# Patient Record
Sex: Female | Born: 1935
Health system: Southern US, Community
[De-identification: ages and names within clinical notes are randomized; demographics above are authoritative.]

## PROBLEM LIST (undated history)

## (undated) DIAGNOSIS — F329 Major depressive disorder, single episode, unspecified: Secondary | ICD-10-CM

## (undated) DIAGNOSIS — M5416 Radiculopathy, lumbar region: Secondary | ICD-10-CM

## (undated) DIAGNOSIS — R32 Unspecified urinary incontinence: Secondary | ICD-10-CM

## (undated) DIAGNOSIS — M1990 Unspecified osteoarthritis, unspecified site: Secondary | ICD-10-CM

## (undated) DIAGNOSIS — E039 Hypothyroidism, unspecified: Secondary | ICD-10-CM

## (undated) DIAGNOSIS — S6291XA Unspecified fracture of right wrist and hand, initial encounter for closed fracture: Secondary | ICD-10-CM

## (undated) DIAGNOSIS — I82409 Acute embolism and thrombosis of unspecified deep veins of unspecified lower extremity: Secondary | ICD-10-CM

## (undated) DIAGNOSIS — R7303 Prediabetes: Secondary | ICD-10-CM

## (undated) DIAGNOSIS — D649 Anemia, unspecified: Secondary | ICD-10-CM

## (undated) DIAGNOSIS — G4730 Sleep apnea, unspecified: Secondary | ICD-10-CM

## (undated) DIAGNOSIS — Z9289 Personal history of other medical treatment: Secondary | ICD-10-CM

## (undated) DIAGNOSIS — F419 Anxiety disorder, unspecified: Secondary | ICD-10-CM

## (undated) DIAGNOSIS — R059 Cough, unspecified: Secondary | ICD-10-CM

## (undated) DIAGNOSIS — F418 Other specified anxiety disorders: Secondary | ICD-10-CM

## (undated) DIAGNOSIS — IMO0001 Reserved for inherently not codable concepts without codable children: Secondary | ICD-10-CM

## (undated) DIAGNOSIS — I499 Cardiac arrhythmia, unspecified: Secondary | ICD-10-CM

## (undated) DIAGNOSIS — R51 Headache: Secondary | ICD-10-CM

## (undated) DIAGNOSIS — I4891 Unspecified atrial fibrillation: Secondary | ICD-10-CM

## (undated) DIAGNOSIS — C801 Malignant (primary) neoplasm, unspecified: Secondary | ICD-10-CM

## (undated) DIAGNOSIS — J189 Pneumonia, unspecified organism: Secondary | ICD-10-CM

## (undated) DIAGNOSIS — R296 Repeated falls: Secondary | ICD-10-CM

## (undated) DIAGNOSIS — F32A Depression, unspecified: Secondary | ICD-10-CM

## (undated) DIAGNOSIS — Z5189 Encounter for other specified aftercare: Secondary | ICD-10-CM

## (undated) DIAGNOSIS — R05 Cough: Secondary | ICD-10-CM

## (undated) DIAGNOSIS — R0602 Shortness of breath: Secondary | ICD-10-CM

## (undated) DIAGNOSIS — E119 Type 2 diabetes mellitus without complications: Secondary | ICD-10-CM

## (undated) DIAGNOSIS — F0390 Unspecified dementia without behavioral disturbance: Secondary | ICD-10-CM

## (undated) DIAGNOSIS — M542 Cervicalgia: Secondary | ICD-10-CM

## (undated) HISTORY — DX: Cough, unspecified: R05.9

## (undated) HISTORY — PX: REPLACEMENT TOTAL KNEE BILATERAL: SUR1225

## (undated) HISTORY — PX: HERNIA REPAIR: SHX51

## (undated) HISTORY — DX: Type 2 diabetes mellitus without complications: E11.9

## (undated) HISTORY — DX: Cough: R05

## (undated) HISTORY — PX: LAPAROSCOPIC OVARIAN CYSTECTOMY: SUR786

## (undated) HISTORY — PX: JOINT REPLACEMENT: SHX530

## (undated) HISTORY — PX: TONSILLECTOMY: SUR1361

## (undated) HISTORY — PX: EYE SURGERY: SHX253

## (undated) HISTORY — PX: LEG SURGERY: SHX1003

## (undated) HISTORY — PX: APPENDECTOMY: SHX54

## (undated) HISTORY — PX: BACK SURGERY: SHX140

## (undated) HISTORY — DX: Unspecified dementia, unspecified severity, without behavioral disturbance, psychotic disturbance, mood disturbance, and anxiety: F03.90

## (undated) HISTORY — DX: Other specified anxiety disorders: F41.8

## (undated) HISTORY — DX: Prediabetes: R73.03

## (undated) HISTORY — PX: BREAST EXCISIONAL BIOPSY: SUR124

---

## 1995-11-21 LAB — HM PAP SMEAR

## 1995-11-21 LAB — HM MAMMOGRAPHY

## 2007-11-21 LAB — HM COLONOSCOPY

## 2009-08-06 DIAGNOSIS — I10 Essential (primary) hypertension: Secondary | ICD-10-CM | POA: Insufficient documentation

## 2009-08-06 DIAGNOSIS — E039 Hypothyroidism, unspecified: Secondary | ICD-10-CM | POA: Insufficient documentation

## 2009-08-06 DIAGNOSIS — F309 Manic episode, unspecified: Secondary | ICD-10-CM | POA: Insufficient documentation

## 2009-08-06 DIAGNOSIS — M129 Arthropathy, unspecified: Secondary | ICD-10-CM | POA: Insufficient documentation

## 2009-08-24 ENCOUNTER — Encounter: Admission: RE | Admit: 2009-08-24 | Discharge: 2009-08-24 | Payer: Self-pay | Admitting: Family Medicine

## 2009-08-24 ENCOUNTER — Encounter: Payer: Self-pay | Admitting: Internal Medicine

## 2010-03-02 ENCOUNTER — Ambulatory Visit: Payer: Self-pay | Admitting: Internal Medicine

## 2010-03-02 DIAGNOSIS — F341 Dysthymic disorder: Secondary | ICD-10-CM

## 2010-03-02 DIAGNOSIS — I959 Hypotension, unspecified: Secondary | ICD-10-CM

## 2010-03-02 DIAGNOSIS — R059 Cough, unspecified: Secondary | ICD-10-CM | POA: Insufficient documentation

## 2010-03-02 DIAGNOSIS — R079 Chest pain, unspecified: Secondary | ICD-10-CM

## 2010-03-02 DIAGNOSIS — R0609 Other forms of dyspnea: Secondary | ICD-10-CM

## 2010-03-02 DIAGNOSIS — R0989 Other specified symptoms and signs involving the circulatory and respiratory systems: Secondary | ICD-10-CM

## 2010-03-02 DIAGNOSIS — R05 Cough: Secondary | ICD-10-CM

## 2010-03-03 ENCOUNTER — Telehealth: Payer: Self-pay | Admitting: Internal Medicine

## 2010-03-03 LAB — CONVERTED CEMR LAB
ALT: 15 units/L (ref 0–35)
AST: 22 units/L (ref 0–37)
Albumin: 3.9 g/dL (ref 3.5–5.2)
Alkaline Phosphatase: 63 units/L (ref 39–117)
BUN: 25 mg/dL — ABNORMAL HIGH (ref 6–23)
Basophils Absolute: 0 10*3/uL (ref 0.0–0.1)
Basophils Relative: 0.1 % (ref 0.0–3.0)
Bilirubin, Direct: 0.1 mg/dL (ref 0.0–0.3)
CK-MB: 1.2 ng/mL (ref 0.3–4.0)
CO2: 31 meq/L (ref 19–32)
Calcium: 9.1 mg/dL (ref 8.4–10.5)
Chloride: 100 meq/L (ref 96–112)
Creatinine, Ser: 0.8 mg/dL (ref 0.4–1.2)
Eosinophils Absolute: 0 10*3/uL (ref 0.0–0.7)
Eosinophils Relative: 0.1 % (ref 0.0–5.0)
GFR calc non Af Amer: 74.79 mL/min (ref >60)
Glucose, Bld: 138 mg/dL — ABNORMAL HIGH (ref 70–99)
HCT: 37.9 % (ref 36.0–46.0)
Hemoglobin: 13.1 g/dL (ref 12.0–15.0)
Lymphocytes Relative: 11.1 % — ABNORMAL LOW (ref 12.0–46.0)
Lymphs Abs: 0.8 10*3/uL (ref 0.7–4.0)
MCHC: 34.4 g/dL (ref 30.0–36.0)
MCV: 89.8 fL (ref 78.0–100.0)
Monocytes Absolute: 0.1 10*3/uL (ref 0.1–1.0)
Monocytes Relative: 1.4 % — ABNORMAL LOW (ref 3.0–12.0)
Neutro Abs: 6.1 10*3/uL (ref 1.4–7.7)
Neutrophils Relative %: 87.3 % — ABNORMAL HIGH (ref 43.0–77.0)
Platelets: 230 10*3/uL (ref 150.0–400.0)
Potassium: 4.5 meq/L (ref 3.5–5.1)
Pro B Natriuretic peptide (BNP): 212 pg/mL — ABNORMAL HIGH (ref 0.0–100.0)
RBC: 4.23 M/uL (ref 3.87–5.11)
RDW: 13.2 % (ref 11.5–14.6)
Relative Index: 1.9 (ref 0.0–2.5)
Sed Rate: 10 mm/hr (ref 0–22)
Sodium: 137 meq/L (ref 135–145)
TSH: 0.47 microintl units/mL (ref 0.35–5.50)
Total Bilirubin: 0.4 mg/dL (ref 0.3–1.2)
Total CK: 62 units/L (ref 7–177)
Total Protein: 7 g/dL (ref 6.0–8.3)
WBC: 6.9 10*3/uL (ref 4.5–10.5)

## 2010-03-22 ENCOUNTER — Ambulatory Visit: Payer: Self-pay | Admitting: Internal Medicine

## 2010-03-24 ENCOUNTER — Ambulatory Visit (HOSPITAL_COMMUNITY)
Admission: RE | Admit: 2010-03-24 | Discharge: 2010-03-24 | Payer: Self-pay | Source: Home / Self Care | Admitting: Internal Medicine

## 2010-04-05 ENCOUNTER — Ambulatory Visit: Payer: Self-pay | Admitting: Internal Medicine

## 2010-04-06 ENCOUNTER — Ambulatory Visit: Payer: Self-pay | Admitting: Cardiology

## 2010-04-07 ENCOUNTER — Telehealth: Payer: Self-pay | Admitting: Adult Health

## 2010-04-21 ENCOUNTER — Ambulatory Visit: Payer: Self-pay | Admitting: Cardiovascular Disease

## 2010-04-21 DIAGNOSIS — R609 Edema, unspecified: Secondary | ICD-10-CM | POA: Insufficient documentation

## 2010-04-22 ENCOUNTER — Telehealth (INDEPENDENT_AMBULATORY_CARE_PROVIDER_SITE_OTHER): Payer: Self-pay | Admitting: *Deleted

## 2010-04-25 ENCOUNTER — Telehealth: Payer: Self-pay | Admitting: Cardiovascular Disease

## 2010-04-26 ENCOUNTER — Ambulatory Visit (HOSPITAL_COMMUNITY): Admission: RE | Admit: 2010-04-26 | Discharge: 2010-04-26 | Payer: Self-pay | Admitting: Cardiovascular Disease

## 2010-04-26 ENCOUNTER — Ambulatory Visit: Payer: Self-pay

## 2010-04-26 ENCOUNTER — Telehealth (INDEPENDENT_AMBULATORY_CARE_PROVIDER_SITE_OTHER): Payer: Self-pay

## 2010-04-26 ENCOUNTER — Ambulatory Visit: Payer: Self-pay | Admitting: Cardiovascular Disease

## 2010-04-26 ENCOUNTER — Encounter: Payer: Self-pay | Admitting: Cardiovascular Disease

## 2010-04-27 ENCOUNTER — Telehealth (INDEPENDENT_AMBULATORY_CARE_PROVIDER_SITE_OTHER): Payer: Self-pay | Admitting: *Deleted

## 2010-04-28 ENCOUNTER — Encounter: Payer: Self-pay | Admitting: Cardiovascular Disease

## 2010-04-28 ENCOUNTER — Encounter (HOSPITAL_COMMUNITY): Admission: RE | Admit: 2010-04-28 | Discharge: 2010-06-21 | Payer: Self-pay | Admitting: Cardiovascular Disease

## 2010-04-28 ENCOUNTER — Ambulatory Visit: Payer: Self-pay | Admitting: Cardiovascular Disease

## 2010-04-28 ENCOUNTER — Ambulatory Visit: Payer: Self-pay

## 2010-04-28 LAB — CONVERTED CEMR LAB
Pro B Natriuretic peptide (BNP): 53.1 pg/mL (ref 0.0–100.0)
Sed Rate: 9 mm/hr (ref 0–22)

## 2010-05-05 ENCOUNTER — Ambulatory Visit: Payer: Self-pay | Admitting: Internal Medicine

## 2010-05-05 DIAGNOSIS — R930 Abnormal findings on diagnostic imaging of skull and head, not elsewhere classified: Secondary | ICD-10-CM

## 2010-05-09 ENCOUNTER — Ambulatory Visit: Payer: Self-pay | Admitting: Cardiology

## 2010-05-09 LAB — CONVERTED CEMR LAB: POC INR: 1.6

## 2010-05-13 ENCOUNTER — Ambulatory Visit: Payer: Self-pay | Admitting: Cardiology

## 2010-05-13 LAB — CONVERTED CEMR LAB: POC INR: 2.8

## 2010-05-17 ENCOUNTER — Ambulatory Visit: Payer: Self-pay | Admitting: Internal Medicine

## 2010-05-17 LAB — CONVERTED CEMR LAB: POC INR: 2.4

## 2010-05-30 ENCOUNTER — Ambulatory Visit: Payer: Self-pay | Admitting: Cardiology

## 2010-05-30 ENCOUNTER — Ambulatory Visit: Payer: Self-pay | Admitting: Internal Medicine

## 2010-05-30 LAB — CONVERTED CEMR LAB: POC INR: 2.7

## 2010-06-27 ENCOUNTER — Ambulatory Visit: Payer: Self-pay | Admitting: Internal Medicine

## 2010-06-27 LAB — CONVERTED CEMR LAB: POC INR: 3.8

## 2010-07-18 ENCOUNTER — Ambulatory Visit: Payer: Self-pay | Admitting: Cardiology

## 2010-07-18 LAB — CONVERTED CEMR LAB: POC INR: 2.2

## 2010-07-20 ENCOUNTER — Encounter
Admission: RE | Admit: 2010-07-20 | Discharge: 2010-07-20 | Payer: Self-pay | Source: Home / Self Care | Admitting: Family Medicine

## 2010-08-08 ENCOUNTER — Ambulatory Visit: Payer: Self-pay | Admitting: Internal Medicine

## 2010-08-08 LAB — CONVERTED CEMR LAB: POC INR: 2.3

## 2010-08-29 ENCOUNTER — Encounter
Admission: RE | Admit: 2010-08-29 | Discharge: 2010-10-28 | Payer: Self-pay | Source: Home / Self Care | Attending: Neurology | Admitting: Neurology

## 2010-08-30 ENCOUNTER — Encounter: Admission: RE | Admit: 2010-08-30 | Discharge: 2010-08-30 | Payer: Self-pay | Admitting: Diagnostic Neuroimaging

## 2010-09-05 ENCOUNTER — Ambulatory Visit: Payer: Self-pay | Admitting: Cardiology

## 2010-09-05 ENCOUNTER — Telehealth: Payer: Self-pay | Admitting: Cardiovascular Disease

## 2010-09-05 LAB — CONVERTED CEMR LAB: POC INR: 3.1

## 2010-09-13 ENCOUNTER — Telehealth: Payer: Self-pay | Admitting: Cardiovascular Disease

## 2010-11-03 ENCOUNTER — Ambulatory Visit: Payer: Self-pay | Admitting: Cardiovascular Disease

## 2010-11-03 LAB — CONVERTED CEMR LAB: POC INR: 1.4

## 2010-11-05 ENCOUNTER — Emergency Department (HOSPITAL_COMMUNITY)
Admission: EM | Admit: 2010-11-05 | Discharge: 2010-11-05 | Payer: Self-pay | Source: Home / Self Care | Admitting: Emergency Medicine

## 2010-11-17 ENCOUNTER — Ambulatory Visit: Payer: Self-pay | Admitting: Cardiology

## 2010-11-18 LAB — CONVERTED CEMR LAB: POC INR: 2.7

## 2010-12-09 ENCOUNTER — Ambulatory Visit: Admission: RE | Admit: 2010-12-09 | Discharge: 2010-12-09 | Payer: Self-pay | Source: Home / Self Care

## 2010-12-09 ENCOUNTER — Ambulatory Visit
Admission: RE | Admit: 2010-12-09 | Discharge: 2010-12-09 | Payer: Self-pay | Source: Home / Self Care | Attending: Cardiovascular Disease | Admitting: Cardiovascular Disease

## 2010-12-09 LAB — CONVERTED CEMR LAB: POC INR: 1.5

## 2010-12-11 ENCOUNTER — Encounter: Payer: Self-pay | Admitting: Cardiovascular Disease

## 2010-12-20 NOTE — Assessment & Plan Note (Signed)
Summary: Pulmonary/ ext summary fu ov, did not bring calendar requested   Copy to:  Dr. Cleda Daub Primary Provider/Referring Provider:  Dr. Cleda Daub  CC:  Followup.  Pt states that her breathing is the same- no better or worse.  She states that her cough has worsened over the past 2 wks.  Cough is non prod and bothers her mainly in the night..  History of Present Illness: 48 yowf quit smoking in 1971 with no  respiratory problems at that point.  March 02, 2010 cc cough since Oct 2010 came on with flu like symptoms which resolved presently daily seems some worse afternoons and evenings and sleep cpap ok,  no excess sputum,  sinus congestion just since pollen but no worse cough.  Prednisone started 4/12.   Also c/o breathing problems with ex onset after sinus clogged   also sharp ant right cp x 9 months comes and goes, maybe once week, lasts sev minutes and radiates to neck, never with ex last episode 4/12 rec rx as gerd with diet and ppi/hs h2.  Mar 22, 2010 2 wk followup.  Pt states that her breathing and cough are the same- no better or worse.  She denies any new complaints today. Cough is worse after 3 pm but sleeps fine but then wakes up and stirs around before the cough comes back.  CP is not daily but sporadic no pattern in terms of onset but typically resolved in less than a minute, assoc with lots of gas pains and excess flatus. no fatty food intol or previous gb dz.     Apr 05, 2010--Presents for follow up and med reivew. Last visit - prilosec changed to dexilant and citrucel added. Baseline goes to gym 4x week- water exercise, works on machines. No exertional chest pain.  Hx of DVT after surgery in her 77s (ovarian surgery) . Cough is some better, but not completely gone. [name] No change in right side/epigastric pain w/ dexilant and citrucel. She still complains of being short of breath. Definite change in activity tolerance w/ associated dyspnea requiring her to stop in last 6 months,  started after her move from [place] in 10/10. Denies exertional chest pain or jaw pain. Denies  orthopnea, hemoptysis, fever, n/v/d, edema, headache. Prev. labs essentially unremarkable except for elevated bnp at 212.    see page 2 May 05, 2010 Followup.  Pt states that her breathing is the same- no better or worse.  She states that her cough has worsened over the past 2 wks.  Cough is non prod and bothers her mainly in the night w/in a few hours of lying down.  Pt denies any significant sore throat, dysphagia, itching, sneezing,  nasal congestion or excess secretions,  fever, chills, sweats, unintended wt loss, pleuritic or exertional cp, hempoptysis, change in activity tolerance  orthopnea pnd or leg swelling.  Did not bring in calendar or maintain rx as requested to eval response to empiric rx for gerd.   Current Medications (verified): 1)  Cymbalta 60 Mg Cpep (Duloxetine Hcl) .... Take 2 in The Morning 2)  Budeprion Xl 300 Mg Xr24h-Tab (Bupropion Hcl) .... 2 Tabs By Mouth Once Daily 3)  Trazodone Hcl 100 Mg Tabs (Trazodone Hcl) .... 4 At Bedtime 4)  Multivitamins  Tabs (Multiple Vitamin) .[name].. 1 Once Daily 5)  Citrucel  Powd (Methylcellulose (Laxative)) .[name].. 1 Tbsp in The Morning 6)  Voltaren 1 % Gel (Diclofenac Sodium) .... Apply As Needed 7)  Ibuprofen 200  Mg Tabs (Ibuprofen) .... As Needed 8)  Levothyroxine Sodium 50 Mcg Tabs (Levothyroxine Sodium) .[name].. 1  Tab By Mouth Once Daily 9)  Warfarin Sodium 5 Mg Tabs (Warfarin Sodium) .... Use As Directed By Anticoagulation Clinic  Allergies (verified): No Known Drug Allergies  Past History:  Past Medical History: HYPOTENSION (ICD-458.9) DEPRESSION/ANXIETY (ICD-300.4) Cough.............................................................Marland KitchenWert    - onset 08/2009 R ant CP     - onset 10/2009    - Abd [place] ordered Mar 22, 2010 >>neg    - nl myoview nuclear scan 04/28/10    - CT chest 04/06/10 subtle GG changes diffusely          with nl esr/bnp  04/21/10  Vital Signs:  Patient profile:   76 year old female Weight:      222 pounds O2 Sat:      96 % on Room air Temp:     98.6 degrees F oral Pulse rate:   66 / minute BP sitting:   104 / 64  (left arm) Cuff size:   large  Vitals Entered By: Vernie Murders (May 05, 2010 11:34 AM)  O2 Flow:  Room air  Serial Vital Signs/Assessments:  Comments: 12:19 PM Ambulatory Pulse Oximetry  Resting; HR__67___    02 Sat__97%ra___  Lap1 (185 feet)   HR__80___   02 Sat__98%ra___ Lap2 (185 feet)   HR_____   02 Sat_____    Lap3 (185 feet)   HR_____   02 Sat_____  ___Test Completed without Difficulty _x__Test Stopped due to: pt c/o leg pain and SOB  By: Vernie Murders    Physical Exam  Additional Exam:  obese wf nad with depressed affect with hopeless/helpless features  wt 223 March 02, 2010 > 219 Mar 22, 2010 >223 04/05/10 >> 222 May 05, 2010  HEENT: nl dentition,  and orophanx. Mod nonspecific edema of both turbinates.  Nl external ear canals without cough reflex NECK :  without JVD/Nodes/TM/ nl carotid upstrokes bilaterally LUNGS: no acc muscle use, clear to A and P bilaterally without cough on insp or exp maneuvers CV:  RRR  no s3 or murmur or increase in P2, no edema  ABD:  soft and nontender with nl excursion in the supine position. No bruits or organomegaly, bowel sounds nl MS:  warm without deformities, calf tenderness, cyanosis or clubbing       Impression & Recommendations:  Problem # 1:  COUGH (ICD-786.2) The most common causes of chronic cough in immunocompetent adults include: upper airway cough syndrome (UACS), previously referred to as postnasal drip syndrome,  caused by variety of rhinosinus conditions; (2) asthma; (3) GERD; (4) chronic bronchitis from cigarette smoking or other inhaled environmental irritants; (5) nonasthmatic eosinophilic bronchitis; and (6) bronchiectasis. These conditions, singly or in combination, have accounted for up to 94% of the causes of  chronic cough in prospective studies.   On the basis of initil hx, exam and lab review most likely dx = Upper airway cough syndrome, so named because it's frequently impossible to sort out how much is CR/sinusitis with freq throat clearing generating secondary extra esophageal GERD from wide swings in gastric pressure that occur with throat clearing, promoting self use of mint and menthol lozenges that reduce the lower esophageal sphincter tone and exacerbate the problem further.  These symptoms are easily confused with asthma/copd by even experienced pulmonogists because they overlap so much. These are the same pts who not infrequently have failed to tolerate ace inhibitors,  dry powder inhalers or  biphosphonates or report having reflux symptoms that don't respond to standard doses of PPI   I had an extended discussion with the patient today lasting 15 to 20 minutes of a 25 minute visit on the following issues:  For now max rx for GERD. NB the  ramp to expected improvement (and for that matter, worsening, if a chronic effective medication is stopped)  can be measured in weeks, not days, a common misconception because this is not Heartburn with no immediate cause and effect relationship so that response to therapy or lack thereof can be very difficult to assess.      Problem # 2:  DYSPNEA (ICD-786.09) Not able to reproduce this symptom, seems more fatigue/ depression related. no evidence of asthma by spirometry 04/05/10  Problem # 3:  ABNORMAL LUNG XRAY (ICD-793.1) with nl cxr and ex sats and nl esr and bnp difficult to know what to make of the subtle changes on ct scan but don't feel this is related to her present severe decline so will f/u conservatively.  Other Orders: Est. Patient Level IV (99214) Pulse Oximetry, Ambulatory (16109)  Patient Instructions: 1)  Dexilant 60 mg Take  one 30-60 min before first meal of the day and pepcid 20 mg one at bedtime 2)  Please schedule a follow-up  appointment in 3 weeks, sooner if needed  3)  GERD (REFLUX)  is a common cause of respiratory symptoms. It commonly presents without heartburn and can be treated with medication, but also with lifestyle changes including avoidance of late meals, excessive alcohol, smoking cessation, and avoid fatty foods, chocolate, peppermint, colas, red wine, and acidic juices such as orange juice. NO MINT OR MENTHOL PRODUCTS SO NO COUGH DROPS  4)  USE SUGARLESS CANDY INSTEAD (jolley ranchers)  5)  NO OIL BASED VITAMINS  6)  Copy sent to: Valente David [address]

## 2010-12-20 NOTE — Assessment & Plan Note (Signed)
Summary: Cardiology Nuclear Study  Nuclear Med Background Indications for Stress Test: Evaluation for Ischemia   History: Echo, GXT  History Comments:  ~15 yrs ago GXT:OK per patient; 04/26/10 Echo:EF=55-60%, mild MR; h/o DVT  Symptoms: Chest Pressure, DOE, Fatigue  Symptoms Comments: Last episode of CP:2 days ago.   Nuclear Pre-Procedure Cardiac Risk Factors: Family History - CAD, History of Smoking, Obesity Caffeine/Decaff Intake: None NPO After: 6:00 PM IV 0.9% NS with Angio Cath: 22g     IV Site: (L) Forearm IV Started by: Stanton Kidney EMT-P Chest Size (in) 40     Cup Size B     Height (in): 64 Weight (lb): 222 BMI: 38.24  Nuclear Med Study 1 or 2 day study:  1 day     Stress Test Type:  Eugenie Birks Reading MD:  Charlton Haws, MD     Referring MD:  Charlton Haws, MD Resting Radionuclide:  Technetium 95m Tetrofosmin     Resting Radionuclide Dose:  11.0 mCi  Stress Radionuclide:  Technetium 86m Tetrofosmin     Stress Radionuclide Dose:  33.0 mCi   Stress Protocol   Lexiscan: 0.4 mg   Stress Test Technologist:  Rea [employer] CMA-N     Nuclear Technologist:  Burna Mortimer Deal RT-N  Rest Procedure  Myocardial perfusion imaging was performed at rest 45 minutes following the intravenous administration of Myoview Technetium 77m Tetrofosmin.  Stress Procedure  The patient received IV Lexiscan 0.4 mg over 15-seconds.  Myoview injected at 30-seconds.  There were no significant changes with infusion.  She did c/o throat tightness with infusion.  Quantitative spect images were obtained after a 45 minute delay.  QPS Raw Data Images:  Normal; no motion artifact; normal heart/lung ratio. Stress Images:  NI: Uniform and normal uptake of tracer in all myocardial segments. Rest Images:  Normal homogeneous uptake in all areas of the myocardium. Subtraction (SDS):  Normal Transient Ischemic Dilatation:  .99  (Normal <1.22)  Lung/Heart Ratio:  .23  (Normal <0.45)  Quantitative Gated Spect  Images QGS EDV:  79 ml QGS ESV:  22 ml QGS EF:  72 % QGS cine images:  normal  Findings Normal nuclear study      Overall Impression  Exercise Capacity: Lexiscan BP Response: Normal blood pressure response. Clinical Symptoms: No chest pain ECG Impression: No significant ST segment change suggestive of ischemia. Overall Impression: Normal stress nuclear study.  Appended Document: Cardiology Nuclear Study normal nuclear study  Appended Document: Cardiology Nuclear Study pt aware of results

## 2010-12-20 NOTE — Progress Notes (Signed)
Summary: copy of test results  Phone Note Call from Patient Call back at Home Phone [phone]   Caller: Patient Reason for Call: Talk to Nurse, Lab or Test Results Summary of Call: copy of test results.  Initial call taken by: Lorne Skeens,  September 13, 2010 2:31 PM  Follow-up for Phone Call        spoke with pt, copy of testing mailed to pt Deliah Goody, RN  September 13, 2010 4:50 PM

## 2010-12-20 NOTE — Progress Notes (Signed)
Summary: appt w/ Dr. Sherene Sires  ---- Converted from flag ---- ---- 04/07/2010 9:43 AM, Rubye Oaks NP wrote: her cards ov is not until 6/2 which is fine, but she does not need to see Dr. Sherene Sires until 1 week after this. can we change this sorry. ------------------------------  Phone Note Outgoing Call Call back at [facility] Phone [phone]   Call placed by: Boone Master CNA/MA,  Apr 07, 2010 3:29 PM Call placed to: Patient Summary of Call: called pt at home #.  LMOM TCB to schedule appt with Dr. Sherene Sires. Initial call taken by: Boone Master CNA/MA,  Apr 07, 2010 3:32 PM  Follow-up for Phone Call        Spoke with pt; aware of appt on 05-05-2010 at 11am with MW.Katie [facility] CMA  Apr 07, 2010 4:09 PM

## 2010-12-20 NOTE — Medication Information (Signed)
Summary: rov/lb  Anticoagulant Therapy  Managed by: Bethena Midget, RN, BSN Referring MD: Charlton Haws, MD PCP: Dr. Cleda Daub Supervising MD: Riley Kill MD, Maisie Fus Indication 1: DVT Lab Used: [facility] [place] Site: [address] INR POC 2.8 INR RANGE 2-3  Dietary changes: no    Health status changes: no    Bleeding/hemorrhagic complications: yes       Details: Bruises all over from falls. She states she has fallen twice since Mondays visit. Flag sent to Dr Eden Emms   Recent/future hospitalizations: no    Any changes in medication regimen? no    Recent/future dental: no  Any missed doses?: no       Is patient compliant with meds? yes      Comments: DVT behinid left knee  Allergies: No Known Drug Allergies  Anticoagulation Management History:      The patient is taking warfarin and comes in today for a routine follow up visit.  Positive risk factors for bleeding include an age of 74 years or older.  The bleeding index is 'intermediate risk'.  Negative CHADS2 values include Age > 65 years old.  Anticoagulation responsible provider: Riley Kill MD, Maisie Fus.  INR POC: 2.8.  Cuvette Lot#: [id number].  Exp: 07/2011.    Anticoagulation Management Assessment/Plan:      The patient's current anticoagulation dose is Warfarin sodium 5 mg tabs: Use as directed by Anticoagulation Clinic.  The next INR is due 05/17/2010.  Anticoagulation instructions were given to patient.  Results were reviewed/authorized by Bethena Midget, RN, BSN.  She was notified by Bethena Midget, RN, BSN.         Prior Anticoagulation Instructions: INR 1.6 (goal is 2-3)  Take an extra 1/2 tablet tonight then continue taking 1 tablet (5 mg) daily except take 1.5 tablets (7.5 mg) on Mondays and Wednesdays. Recheck on Friday.  -Coumadin interacts with many drugs like NSAIDs (Ibuprofen, Aleve, Naproxen, etc) and antibiotics.  Always tell all your doctors that you are on Coumadin.  Try to use Tylenol (acetaminophen)  for pain. -Coumadin also interacts with dark green leafy vegetables (spinach, broccoli, cabbage, etc.) and food with lots of mayonnaise (chicken salad, coleslaw, etc.).  You do not have to limit the amount you eat but try to keep your diet consistent. -Side effect is bleeding.  Call doctor if experience excessive nose bleeding or notice blood after using bathroom. -Try to take coumadin in evening.  Current Anticoagulation Instructions: INR 2.8 Change dose to 5mg s everyday except 7.5mg s on Wednesdays. Recheck in 4 days.

## 2010-12-20 NOTE — Assessment & Plan Note (Signed)
Summary: Pulmonary/ ext summary final f/u ov   Copy to:  Dr. Cleda Daub Primary Provider/Referring Provider:  Dr. Cleda Daub  CC:  3 wk followup.  Pt c/o worsening cough over the past 10 days.  Cough is nonprod.  She also c/o lowgrade fever at night for the past wk..  History of Present Illness: 84 yowf quit smoking in 1971 with no  respiratory problems at that point.  March 02, 2010 cc cough since Oct 2010 came on with flu like symptoms which resolved presently daily seems some worse afternoons and evenings and sleep cpap ok,  no excess sputum,  sinus congestion just since pollen but no worse cough.  Prednisone started 4/12.   Also c/o breathing problems with ex onset after sinus clogged   also sharp ant right cp x 9 months comes and goes, maybe once week, lasts sev minutes and radiates to neck, never with ex last episode 4/12 rec rx as gerd with diet and ppi/hs h2.  Mar 22, 2010 2 wk followup.  Pt states that her breathing and cough are the same- no better or worse.  She denies any new complaints today. Cough is worse after 3 pm but sleeps fine but then wakes up and stirs around before the cough comes back.  CP is not daily but sporadic no pattern in terms of onset but typically resolved in less than a minute, assoc with lots of gas pains and excess flatus. no fatty food intol or previous gb dz.     Apr 05, 2010--Presents for follow up and med reivew. Last visit - prilosec changed to dexilant and citrucel added. Baseline goes to gym 4x week- water exercise, works on machines. No exertional chest pain.  Hx of DVT after surgery in her 36s (ovarian surgery) . Cough is some better, but not completely gone. [name] No change in right side/epigastric pain w/ dexilant and citrucel. She still complains of being short of breath. Definite change in activity tolerance w/ associated dyspnea requiring her to stop in last 6 months, started after her move from [place] in 10/10.  Prev. labs essentially unremarkable  except for elevated bnp at 212.    see page 2 May 05, 2010 Followup.  Pt states that her breathing is the same- no better or worse.  She states that her cough has worsened over the past 2 wks.  Cough is non prod and bothers her mainly in the night w/in a few hours of lying down.   Current Medications (verified): 1)  Cymbalta 60 Mg Cpep (Duloxetine Hcl) .... Take 2 in The Morning 2)  Budeprion Xl 300 Mg Xr24h-Tab (Bupropion Hcl) .... 2 Tabs By Mouth Once Daily 3)  Trazodone Hcl 100 Mg Tabs (Trazodone Hcl) .... 4 At Bedtime 4)  Multivitamins  Tabs (Multiple Vitamin) .[name].. 1 Once Daily 5)  Prilosec 20 Mg Cpdr (Omeprazole) .[name].. 1 30 Min Before First Meal 6)  Citrucel  Powd (Methylcellulose (Laxative)) .[name].. 1 Tbsp in The Morning 7)  Voltaren 1 % Gel (Diclofenac Sodium) .... Apply As Needed 8)  Levothyroxine Sodium 50 Mcg Tabs (Levothyroxine Sodium) .[name].. 1  Tab By Mouth Once Daily 9)  Warfarin Sodium 5 Mg Tabs (Warfarin Sodium) .... Use As Directed By Anticoagulation Clinic 10)  Vistaril 25 Mg Caps (Hydroxyzine Pamoate) .[name].. 1 Every 8 Hours As Needed  Allergies (verified): No Known Drug Allergies  Past History:  Past Medical History: HYPOTENSION (ICD-458.9) DEPRESSION/ANXIETY (ICD-300.4) Cough.............................................................Marland KitchenWert    - onset 08/2009 R ant  CP     - onset 10/2009    - Abd [place] ordered Mar 22, 2010 >>neg    - nl myoview nuclear scan 04/28/10    - CT chest 04/06/10 subtle GG changes diffusely          with nl esr/bnp 04/21/10    - rec repeat cxr May 30, 2010 > left without getting this done as rec  Vital Signs:  Patient profile:   76 year old female Weight:      224 pounds O2 Sat:      98 % on Room air Temp:     98.2 degrees F oral Pulse rate:   68 / minute BP sitting:   132 / 82  (right arm) Cuff size:   large  Vitals Entered By: Vernie Murders (May 30, 2010 10:21 AM)  O2 Flow:  Room air  Physical Exam  Additional Exam:  obese wf nad  with depressed affect with hopeless/helpless features  wt 223 March 02, 2010 > 219 Mar 22, 2010 >223 04/05/10 >> 222 May 05, 2010 > 224 May 30, 2010  HEENT: nl dentition,  and orophanx. Mod nonspecific edema of both turbinates.  Nl external ear canals without cough reflex NECK :  without JVD/Nodes/TM/ nl carotid upstrokes bilaterally LUNGS: no acc muscle use, clear to A and P bilaterally without cough on insp or exp maneuvers CV:  RRR  no s3 or murmur or increase in P2, no edema  ABD:  soft and nontender with nl excursion in the supine position. No bruits or organomegaly, bowel sounds nl MS:  warm without deformities, calf tenderness, cyanosis or clubbing       Impression & Recommendations:  Problem # 1:  COUGH (ICD-786.2)  better with max rx of gerd, continue rx  I had an extended discussion with the patient today lasting 15 to 20 minutes of a 25 minute visit on the following issues:  Each maintenance medication was reviewed in detail including most importantly the difference between maintenance and as needed and under what circumstances the prns are to be used. This was done in the context of a medication calendar review which provided the patient with a user-friendly unambiguous mechanism for medication administration and reconciliation and provides an action plan for all active problems. It is critical that this be shown to every doctor  for modification during the office visit if necessary so the patient can use it as a working document.   Orders: Est. Patient Level IV (13086)  Problem # 2:  ABNORMAL LUNG XRAY (ICD-793.1)  with nl cxr and ex sats and nl esr and bnp difficult to know what to make of the subtle changes on ct scan but don't feel this is related to her present severe decline so will f/u conservatively > rec f/u cxr  Medications Added to Medication List This Visit: 1)  Prilosec 20 Mg Cpdr (Omeprazole) .[name].. 1 30 min before first meal 2)  Vistaril 25 Mg Caps  (Hydroxyzine pamoate) .[name].. 1 every 8 hours as needed  Other Orders: T-2 View CXR (71020TC)  Patient Instructions: 1)  See calendar for specific medication instructions and bring it back for each and every office visit for every healthcare provider you see.  Without it,  you may not receive the best quality medical care that we feel you deserve. Every medication you take is on this list and if it isn't you shouldn't take it.

## 2010-12-20 NOTE — Assessment & Plan Note (Signed)
Summary: Pulmonary/ ext f/u ov - needs GB u/s next   Copy to:  Dr. Cleda Daub Primary Provider/Referring Provider:  Dr. Cleda Daub  CC:  2 wk followup.  Pt states that her breathing and cough are the same- no better or worse.  She denies any new complaints today.Andrea [name]  History of Present Illness: 76 yowf quit smoking in 1971 with no  respiratory problems at that point.  March 02, 2010 cc cough since Oct 2010 came on with flu like symptoms which resolved presently daily seems some worse afternoons and evenings and sleep cpap ok,  no excess sputum,  sinus congestion just since pollen but no worse cough.  Prednisone started 4/12.   Also c/o breathing problems with ex onset after sinus clogged   also sharp ant right cp x 9 months comes and goes, maybe once week, lasts sev minutes and radiates to neck, never with ex last episode 4/12 rec rx as gerd with diet and ppi/hs h2.  Mar 22, 2010 2 wk followup.  Pt states that her breathing and cough are the same- no better or worse.  She denies any new complaints today. Cough is worse after 3 pm but sleeps fine but then wakes up and stirs around before the cough comes back.  CP is not daily but sporadic no pattern in terms of onset but typically resolved in less than a minute, assoc with lots of gas pains and excess flatus. no fatty food intol or previous gb dz.  Pt denies any significant sore throat, dysphagia, itching, sneezing,  nasal congestion or excess secretions,  fever, chills, sweats, unintended wt loss, pleuritic or exertional cp, hempoptysis, change in activity tolerance  orthopnea pnd or leg swelling Pt also denies any obvious fluctuation in symptoms with weather or environmental change or other alleviating or aggravating factors.          Current Medications (verified): 1)  Levothroid 50 Mcg Tabs (Levothyroxine Sodium) .Andrea [name].. 1 Once Daily 2)  Bupropion Hcl 150 Mg Xr12h-Tab (Bupropion Hcl) .Andrea [name].. 1 Once Daily 3)  Trazodone Hcl 100 Mg Tabs (Trazodone  Hcl) .... 4 At Bedtime 4)  Calcium 500 Mg Tabs (Calcium) .Andrea [name].. 1 Two Times A Day 5)  Multivitamins  Tabs (Multiple Vitamin) .Andrea [name].. 1 Once Daily 6)  Cymbalta 100 Mg .... 2 Once Daily  Allergies (verified): No Known Drug Allergies  Past History:  Past Medical History: HYPOTENSION (ICD-458.9) DEPRESSION/ANXIETY (ICD-300.4) Cough.............................................................Andrea KitchenWert    - onset 08/2009 R ant CP     - onset 10/2009    - Abd [place] ordered Mar 22, 2010   Vital Signs:  Patient profile:   76 year old female Weight:      219 pounds O2 Sat:      96 % on Room air Temp:     98.1 degrees F oral Pulse rate:   81 / minute BP sitting:   106 / 70  (left arm) Cuff size:   large  Vitals Entered By: Vernie Murders (Mar 22, 2010 11:48 AM)  O2 Flow:  Room air  Physical Exam  Additional Exam:  obese wf nad with classic belle indifference affect wt 223 March 02, 2010 > 219 Mar 22, 2010  HEENT: nl dentition,  and orophanx. Mod nonspecific edema of both turbinates.  Nl external ear canals without cough reflex NECK :  without JVD/Nodes/TM/ nl carotid upstrokes bilaterally LUNGS: no acc muscle use, clear to A and P bilaterally without cough on insp or exp  maneuvers CV:  RRR  no s3 or murmur or increase in P2, no edema  ABD:  soft and nontender with nl excursion in the supine position. No bruits or organomegaly, bowel sounds nl MS:  warm without deformities, calf tenderness, cyanosis or clubbing      Impression & Recommendations:  Problem # 1:  COUGH (ICD-786.2)  The most common causes of chronic cough in immunocompetent adults include: upper airway cough syndrome (UACS), previously referred to as postnasal drip syndrome,  caused by variety of rhinosinus conditions; (2) asthma; (3) GERD; (4) chronic bronchitis from cigarette smoking or other inhaled environmental irritants; (5) nonasthmatic eosinophilic bronchitis; and (6) bronchiectasis. These conditions, singly or in  combination, have accounted for up to 94% of the causes of chronic cough in prospective studies.   On the basis of initil hx, exam and lab review most likely dx = Upper airway cough syndrome, so named because it's frequently impossible to sort out how much is CR/sinusitis with freq throat clearing generating secondary extra esophageal GERD from wide swings in gastric pressure that occur with throat clearing, promoting self use of mint and menthol lozenges that reduce the lower esophageal sphincter tone and exacerbate the problem further.  These symptoms are easily confused with asthma/copd by even experienced pulmonogists because they overlap so much. These are the same pts who not infrequently have failed to tolerate ace inhibitors,  dry powder inhalers or biphosphonates or report having reflux symptoms that don't respond to standard doses of PPI  For now max rx for GERD. NB the  ramp to expected improvement (and for that matter, worsening, if a chronic effective medication is stopped)  can be measured in weeks, not days, a common misconception because this is not Heartburn with no immediate cause and effect relationship so that response to therapy or lack thereof can be very difficult to assess.   Orders: Est. Patient Level IV (23557)  Problem # 2:  CHEST PAIN (ICD-786.50)  not consistent with cardiac and likely eitther gb or ibs.  See instructions for specific recommendations   struggling with concept of med reconciliation - doubt our list is correct. To keep things simple, I have asked the patient to first separate medicines that are perceived as maintenance, that is to be taken daily "no matter what", from those medicines that are taken on only on an as-needed basis and I have given the patient examples of both, and then return to see our NP to generate a  detailed  medication calendar which should be followed until the next physician sees the patient and updates it.   Once we're sure that we're all  reading from the same page in terms of medication admiistration, she needs to be scheduled to follow up with me unless cough/sob and cp gone.   May need cardiology f/u to complete the w/u though IHD would be at the very bottom of my ddx.  Medications Added to Medication List This Visit: 1)  Cymbalta 100 Mg  .... 2 once daily 2)  Dexilant 60 Mg Cpdr (Dexlansoprazole) .... Take  one 30-60 min before first meal of the day 3)  Pepcid Ac Maximum Strength 20 Mg Tabs (Famotidine) .... One at bedtime  Other Orders: Misc. Referral (Misc. Ref)  Patient Instructions: 1)  See Tammy NP w/in 2 weeks with all your medications, even over the counter meds, separated in two separate bags, the ones you take no matter what vs the ones you stop once you feel better and  take only as needed.  She will generate for you a new user friendly medication calendar that will put [place] all on the same page re: your medication use.  2)  Deixlant 60 instead of prilosec 3)  Citrucel one tsp twice daily 4)  Avoid food that causes gas 5)  See Patient Care Coordinator before leaving for u/s of gallbladder   CardioPerfect ECG  ID: [id number] Patient: Andrea [name], Andrea [name] DOB: 07/12/1935 Age: 77 Years Old Sex: Female Race: White Height: 64 Weight: 219 Status: Unconfirmed Past Medical History:  HYPOTENSION (ICD-458.9) DEPRESSION/ANXIETY (ICD-300.4) Cough.............................................................Andrea KitchenWert    - onset 10/20111   Recorded: 03/22/2010 12:22 AM P/PR: 118 ms / 157 ms - Heart rate (maximum exercise) QRS: 86 QT/QTc/QTd: 436 ms / 445 ms / 80 ms - Heart rate (maximum exercise)  P/QRS/T axis: 69 deg / 62 deg / 67 deg - Heart rate (maximum exercise)  Heartrate: 65 bpm  Interpretation:   sinus rhythm  septal infarct   QS in V2   No  st or t wave change, just poor rw progression

## 2010-12-20 NOTE — Medication Information (Signed)
Summary: rov/sp  Anticoagulant Therapy  Managed by: Weston Brass, PharmD Referring MD: Charlton Haws, MD PCP: Dr. Cleda Daub Supervising MD: Tenny Craw MD, Gunnar Fusi Indication 1: DVT Lab Used: [facility] [place] Site: [address] INR POC 3.8 INR RANGE 2-3  Dietary changes: no    Health status changes: no    Bleeding/hemorrhagic complications: no    Recent/future hospitalizations: no    Any changes in medication regimen? no    Recent/future dental: no  Any missed doses?: yes     Details: Pt got confused and may of taken 2 doses on Thursday. She is unsure.  Is patient compliant with meds? yes       Allergies: No Known Drug Allergies  Anticoagulation Management History:      The patient is taking warfarin and comes in today for a routine follow up visit.  Positive risk factors for bleeding include an age of 74 years or older.  The bleeding index is 'intermediate risk'.  Negative CHADS2 values include Age > 62 years old.  Anticoagulation responsible Kennethia Lynes: Tenny Craw MD, Gunnar Fusi.  INR POC: 3.8.  Cuvette Lot#: [id number].  Exp: 08/2011.    Anticoagulation Management Assessment/Plan:      The patient's current anticoagulation dose is Warfarin sodium 5 mg tabs: Use as directed by Anticoagulation Clinic.  The target INR is 2.0-3.0.  The next INR is due 07/18/2010.  Anticoagulation instructions were given to patient.  Results were reviewed/authorized by Weston Brass, PharmD.  She was notified by Gweneth Fritter, PharmD Candidate.         Prior Anticoagulation Instructions: INR 2.7  Continue same dose of 1 tablet every day except 1 1/2 tablets on Wednesday.   Current Anticoagulation Instructions: INR 3.8  Skip today's dose.  Then resume taking 1 tablet (5mg ) daily except take 1.5 tablets (7.5mg ) on Wed.  Recheck in 3 weeks.

## 2010-12-20 NOTE — Medication Information (Signed)
Summary: rov/tm  Anticoagulant Therapy  Managed by: Weston Brass, PharmD Referring MD: Charlton Haws, MD PCP: Dr. Cleda Daub Supervising MD: Gala Romney MD, Reuel Boom Indication 1: DVT Lab Used: [facility] [place] Site: [address] INR POC 2.4 INR RANGE 2-3  Dietary changes: no    Health status changes: no    Bleeding/hemorrhagic complications: no    Recent/future hospitalizations: no    Any changes in medication regimen? no    Recent/future dental: no  Any missed doses?: no       Is patient compliant with meds? yes       Allergies: No Known Drug Allergies  Anticoagulation Management History:      The patient is taking warfarin and comes in today for a routine follow up visit.  Positive risk factors for bleeding include an age of 74 years or older.  The bleeding index is 'intermediate risk'.  Negative CHADS2 values include Age > 57 years old.  Anticoagulation responsible provider: Mirha Brucato MD, Reuel Boom.  INR POC: 2.4.  Cuvette Lot#: [id number].  Exp: 07/2011.    Anticoagulation Management Assessment/Plan:      The patient's current anticoagulation dose is Warfarin sodium 5 mg tabs: Use as directed by Anticoagulation Clinic.  The target INR is 2.0-3.0.  The next INR is due 05/30/2010.  Anticoagulation instructions were given to patient.  Results were reviewed/authorized by Weston Brass, PharmD.  She was notified by Weston Brass PharmD.         Prior Anticoagulation Instructions: INR 2.8 Change dose to 5mg s everyday except 7.5mg s on Wednesdays. Recheck in 4 days.   Current Anticoagulation Instructions: INR 2.4  Continue same dose of 1 tablet every day except 1 1/2 tablets on Wednesday.

## 2010-12-20 NOTE — Assessment & Plan Note (Signed)
Summary: Pulmonary/ new pt eval for cough   Visit Type:  Initial Consult Copy to:  Dr. Cleda Daub Primary Provider/Referring Provider:  Dr. Cleda Daub  CC:  Cough and Dyspnea.  History of Present Illness: 76 yowf quit smoking in 1971 with no  respiratory problems at that point.  March 02, 2010 cc cough since Oct 2010 came on with flu like symptoms which resolved presently daily seems some worse afternoons and evenings and sleep cpap ok,  no excess sputum,  sinus congestion just since pollen but no worse cough.  Prednisone started yesterday.   Also c/o breathing problems with ex onset after sinus clogged yet.   also sharp ant right cp x 9 months comes and goes, maybe once week, lasts sev minutes and radiates to neck, never with ex last episode 4/12  Pt denies any significant sore throat, dysphagia  ongoing issues with fever, chills, sweats, unintended wt loss, pleuritic or exertional cp, hempoptysis,   orthopnea pnd or leg swelling.  Pt also denies any obvious fluctuation in symptoms with weather or environmental change or other alleviating or aggravating factors.       Current Medications (verified): 1)  Levothroid 50 Mcg Tabs (Levothyroxine Sodium) .[name].. 1 Once Daily 2)  Bupropion Hcl 150 Mg Xr12h-Tab (Bupropion Hcl) .[name].. 1 Once Daily 3)  Trazodone Hcl 100 Mg Tabs (Trazodone Hcl) .... 4 At Bedtime 4)  Prednisone 20 Mg Tabs (Prednisone) .... Taperd Dose As Directed 5)  Calcium 500 Mg Tabs (Calcium) .[name].. 1 Two Times A Day 6)  Multivitamins  Tabs (Multiple Vitamin) .[name].. 1 Once Daily  Allergies (verified): No Known Drug Allergies  Past History:  Past Medical History: HYPOTENSION (ICD-458.9) DEPRESSION/ANXIETY (ICD-300.4) Cough.............................................................Marland KitchenWert    - onset 10/20111  Past Surgical History: Bilateral Knee Replacement Appendectomy  Family History: Heart dz- Father DVT- Son  Social History: Divorced Children Retired Charity fundraiser Former  smoker.  Quit in 1970.  Smoked for approx 10 yrs. Rare ETOH Moved to [place] from [place] in Aug 2010.  Review of Systems       The patient complains of shortness of breath with activity, shortness of breath at rest, non-productive cough, chest pain, weight change, tooth/dental problems, headaches, sneezing, anxiety, depression, hand/feet swelling, and joint stiffness or pain.  The patient denies productive cough, coughing up blood, irregular heartbeats, acid heartburn, indigestion, loss of appetite, abdominal pain, difficulty swallowing, sore throat, nasal congestion/difficulty breathing through nose, itching, ear ache, rash, change in color of mucus, and fever.    Vital Signs:  Patient profile:   76 year old female Height:      64 inches Weight:      223.50 pounds BMI:     38.50 O2 Sat:      94 % on Room air Temp:     100.2 degrees F oral Pulse rate:   72 / minute BP sitting:   120 / 70  (left arm) Cuff size:   large  Vitals Entered ByVernie Murders (March 02, 2010 3:25 PM)  O2 Flow:  Room air  Physical Exam  Additional Exam:  obese wf nad wt 223 March 02, 2010  HEENT: nl dentition,  and orophanx. Mod nonspecific edema of both turbinates.  Nl external ear canals without cough reflex NECK :  without JVD/Nodes/TM/ nl carotid upstrokes bilaterally LUNGS: no acc muscle use, clear to A and P bilaterally without cough on insp or exp maneuvers CV:  RRR  no s3 or murmur or increase in P2, no edema  ABD:  soft and nontender with nl excursion in the supine position. No bruits or organomegaly, bowel sounds nl MS:  warm without deformities, calf tenderness, cyanosis or clubbing SKIN: warm and dry without lesions   NEURO:  alert, approp, no deficits    Sodium                    137 mEq/L                   135-145   Potassium                 4.5 mEq/L                   3.5-5.1   Chloride                  100 mEq/L                   96-112   Carbon Dioxide            31 mEq/L                     19-32   Glucose              [H]  138 mg/dL                   57-84   BUN                  [H]  25 mg/dL                    6-96   Creatinine                0.8 mg/dL                   2.9-5.2   Calcium                   9.1 mg/dL                   8.4-13.2   GFR                       74.79 mL/min                >60  Tests: (2) CBC Platelet w/Diff (CBCD)   White Cell Count          6.9 K/uL                    4.5-10.5   Red Cell Count            4.23 Mil/uL                 3.87-5.11   Hemoglobin                13.1 g/dL                   44.0-10.2   Hematocrit                37.9 %                      36.0-46.0   MCV                       89.8 fl  78.0-100.0   MCHC                      34.4 g/dL                   16.1-09.6   RDW                       13.2 %                      11.5-14.6   Platelet Count            230.0 K/uL                  150.0-400.0   Neutrophil %         [H]  87.3 %                      43.0-77.0   Lymphocyte %         [L]  11.1 %                      12.0-46.0   Monocyte %           [L]  1.4 %                       3.0-12.0   Eosinophils%              0.1 %                       0.0-5.0   Basophils %               0.1 %                       0.0-3.0   Neutrophill Absolute      6.1 K/uL                    1.4-7.7   Lymphocyte Absolute       0.8 K/uL                    0.7-4.0   Monocyte Absolute         0.1 K/uL                    0.1-1.0  Eosinophils, Absolute                             0.0 K/uL                    0.0-0.7   Basophils Absolute        0.0 K/uL                    0.0-0.1  Tests: (3) Hepatic/Liver Function Panel (HEPATIC)   Total Bilirubin           0.4 mg/dL                   0.4-5.4   Direct Bilirubin          0.1 mg/dL                   0.9-8.1   Alkaline Phosphatase      63  U/L                      39-117   AST                       22 U/L                      0-37   ALT                       15 U/L                       0-35   Total Protein             7.0 g/dL                    1.6-1.0   Albumin                   3.9 g/dL                    9.6-0.4  Tests: (4) TSH (TSH)   FastTSH                   0.47 uIU/mL                 0.35-5.50  Tests: (5) B-Type Natiuretic Peptide (BNPR)  B-Type Natriuetic Peptide                        [H]  212.0 pg/mL                 0.0-100.0  Tests: (6) Sed Rate (ESR)   Sed Rate                  10 mm/hr                    0-22  Tests: (7) Cardiac Panel (CARD)   Creatine Kinase           62 U/L                      7-177   Creatine Kinase Mb        1.2 ng/mL                   0.3-4.0   Relative Index            1.9 calc                    0.0-2.5     Relative Index is invalid when CK is <100 U/L  CXR  Procedure date:  03/02/2010  Findings:        Comparison: Chest x-ray of 08/24/2009   Findings: The lungs are clear and slightly hyperaerated. Mediastinal contours are stable.  The heart is within normal limits in size.  There are degenerative changes in the lower thoracic spine and mild thoracic scoliosis again is noted.   IMPRESSION: Stable chest x-ray with slight hyperaeration.  No active lung disease.  Impression & Recommendations:  Problem # 1:  COUGH (ICD-786.2) The most common causes of chronic cough in immunocompetent adults include: upper airway cough syndrome (UACS), previously referred to as postnasal drip syndrome,  caused by variety of rhinosinus conditions; (2) asthma; (3) GERD; (4)  chronic bronchitis from cigarette smoking or other inhaled environmental irritants; (5) nonasthmatic eosinophilic bronchitis; and (6) bronchiectasis. These conditions, singly or in combination, have accounted for up to 94% of the causes of chronic cough in prospective studies.   On the basis of initil hx, exam and lab review most likely dx = Upper airway cough syndrome, so named because it's frequently impossible to sort out how much is CR/sinusitis with freq throat  clearing generating secondary extra esophageal GERD from wide swings in gastric pressure that occur with throat clearing, promoting self use of mint and menthol lozenges that reduce the lower esophageal sphincter tone and exacerbate the problem further.  These symptoms are easily confused with asthma/copd by even experienced pulmonogists because they overlap so much. These are the same pts who not infrequently have failed to tolerate ace inhibitors,  dry powder inhalers or biphosphonates or report having reflux symptoms that don't respond to standard doses of PPI  For now max rx for GERD  Problem # 2:  CHEST PAIN (ICD-786.50) Atypical ? related also to GERD, can't exclude IHD here but doubt, try PPI first  Medications Added to Medication List This Visit: 1)  Levothroid 50 Mcg Tabs (Levothyroxine sodium) .[name].. 1 once daily 2)  Bupropion Hcl 150 Mg Xr12h-tab (Bupropion hcl) .[name].. 1 once daily 3)  Trazodone Hcl 100 Mg Tabs (Trazodone hcl) .... 4 at bedtime 4)  Prednisone 20 Mg Tabs (Prednisone) .... Taperd dose as directed 5)  Calcium 500 Mg Tabs (Calcium) .[name].. 1 two times a day 6)  Multivitamins Tabs (Multiple vitamin) .[name].. 1 once daily  Other Orders: New Patient Level V (60454) T-2 View CXR (71020TC) TLB-BMP (Basic Metabolic Panel-BMET) (80048-METABOL) TLB-CBC Platelet - w/Differential (85025-CBCD) TLB-Hepatic/Liver Function Pnl (80076-HEPATIC) TLB-TSH (Thyroid Stimulating Hormone) (84443-TSH) TLB-BNP (B-Natriuretic Peptide) (83880-BNPR) TLB-Sedimentation Rate (ESR) (85652-ESR) TLB-Cardiac Panel ([id number]-GNFA)  Patient Instructions: 1)  Prednisone 20 2 x 2 days, 1 x  2 days and one half x 2 days and stop 2)  GERD (REFLUX)  is a common cause of respiratory symptoms. It commonly presents without heartburn and can be treated with medication, but also with lifestyle changes including avoidance of late meals, excessive alcohol, smoking cessation, and avoid fatty foods, chocolate, peppermint,  colas, red wine, and acidic juices such as orange juice. NO MINT OR MENTHOL PRODUCTS SO NO COUGH DROPS  3)  USE SUGARLESS CANDY INSTEAD (jolley ranchers)  4)  NO OIL BASED VITAMINS  5)  Prilosec 20mg  Take  one 30-60 min before first meal of the day and pepcid (famotidine ) 20 mg at bedtime 6)  Please schedule a follow-up appointment in 2 weeks, sooner if needed

## 2010-12-20 NOTE — Medication Information (Signed)
Summary: rov/sp  Anticoagulant Therapy  Managed by: Weston Brass, PharmD Referring MD: Charlton Haws, MD PCP: Dr. Cleda Daub Supervising MD: Shirlee Latch MD, Freida Busman Indication 1: DVT Lab Used: [facility] [place] Site: [address] INR POC 2.7 INR RANGE 2-3  Dietary changes: no    Health status changes: no    Bleeding/hemorrhagic complications: no    Recent/future hospitalizations: no    Any changes in medication regimen? no    Recent/future dental: no  Any missed doses?: no       Is patient compliant with meds? yes       Allergies: No Known Drug Allergies  Anticoagulation Management History:      The patient is taking warfarin and comes in today for a routine follow up visit.  Positive risk factors for bleeding include an age of 58 years or older.  The bleeding index is 'intermediate risk'.  Negative CHADS2 values include Age > 81 years old.  Anticoagulation responsible provider: Shirlee Latch MD, Cortnie Ringel.  INR POC: 2.7.  Cuvette Lot#: [id number].  Exp: 07/2011.    Anticoagulation Management Assessment/Plan:      The patient's current anticoagulation dose is Warfarin sodium 5 mg tabs: Use as directed by Anticoagulation Clinic.  The target INR is 2.0-3.0.  The next INR is due 06/27/2010.  Anticoagulation instructions were given to patient.  Results were reviewed/authorized by Weston Brass, PharmD.  She was notified by Weston Brass PharmD.         Prior Anticoagulation Instructions: INR 2.4  Continue same dose of 1 tablet every day except 1 1/2 tablets on Wednesday.    Current Anticoagulation Instructions: INR 2.7  Continue same dose of 1 tablet every day except 1 1/2 tablets on Wednesday.

## 2010-12-20 NOTE — Assessment & Plan Note (Signed)
Summary: np follow up med calendar   Copy to:  Dr. Cleda Daub Primary Provider/Referring Provider:  Dr. Cleda Daub  CC:  New med calendar pt brought her meds with her and pt has no concerns.  History of Present Illness: 76 yowf quit smoking in 1971 with no  respiratory problems at that point.  March 02, 2010 cc cough since Oct 2010 came on with flu like symptoms which resolved presently daily seems some worse afternoons and evenings and sleep cpap ok,  no excess sputum,  sinus congestion just since pollen but no worse cough.  Prednisone started 4/12.   Also c/o breathing problems with ex onset after sinus clogged   also sharp ant right cp x 9 months comes and goes, maybe once week, lasts sev minutes and radiates to neck, never with ex last episode 4/12 rec rx as gerd with diet and ppi/hs h2.  Mar 22, 2010 2 wk followup.  Pt states that her breathing and cough are the same- no better or worse.  She denies any new complaints today. Cough is worse after 3 pm but sleeps fine but then wakes up and stirs around before the cough comes back.  CP is not daily but sporadic no pattern in terms of onset but typically resolved in less than a minute, assoc with lots of gas pains and excess flatus. no fatty food intol or previous gb dz.     Apr 05, 2010--Presents for follow up and med reivew. Last visit - prilosec changed to dexilant and citrucel added. Baseline goes to gym 4x week- water exercise, works on machines. No exertional chest pain.  Hx of DVT after surgery in her 76s (ovarian surgery) . Cough is some better, but not completely gone. [name] No change in right side/epigastric pain w/ dexilant and citrucel. She still complains of being short of breath. Definite change in activity tolerance w/ associated dyspnea requiring her to stop in last 6 months, started after her move from [place] in 10/10. Denies exertional chest pain or jaw pain. Denies  orthopnea, hemoptysis, fever, n/v/d, edema, headache. Prev. labs  essentially unremarkable except for elevated bnp at 212.     Medications Prior to Update: 1)  Bupropion Hcl 150 Mg Xr12h-Tab (Bupropion Hcl) .[name].. 1 Once Daily 2)  Levothroid 50 Mcg Tabs (Levothyroxine Sodium) .[name].. 1 Once Daily 3)  Trazodone Hcl 100 Mg Tabs (Trazodone Hcl) .... 4 At Bedtime 4)  Multivitamins  Tabs (Multiple Vitamin) .[name].. 1 Once Daily 5)  Cymbalta 100 Mg .... 2 Once Daily 6)  Dexilant 60 Mg Cpdr (Dexlansoprazole) .... Take  One 30-60 Min Before First Meal of The Day 7)  Pepcid Ac Maximum Strength 20 Mg Tabs (Famotidine) .... One At Bedtime  Allergies (verified): No Known Drug Allergies  Past History:  Past Surgical History: Last updated: 03/02/2010 Bilateral Knee Replacement Appendectomy  Family History: Last updated: 04/05/2010 Heart dz- Father passed at 58 DVT- Son  Social History: Last updated: 03/02/2010 Divorced Children Retired Charity fundraiser Former smoker.  Quit in 1970.  Smoked for approx 10 yrs. Rare ETOH Moved to [place] from [place] in Aug 2010.  Past Medical History: HYPOTENSION (ICD-458.9) DEPRESSION/ANXIETY (ICD-300.4) Cough.............................................................Marland KitchenWert    - onset 08/2009 R ant CP     - onset 10/2009    - Abd [place] ordered Mar 22, 2010 >>neg  Family History: Heart dz- Father passed at 50 DVT- Son  Review of Systems      See HPI  Vital Signs:  Patient  profile:   76 year old female Height:      64 inches Weight:      223 pounds BMI:     38.42 O2 Sat:      98 % on Room air Temp:     97.2 degrees F oral Pulse rate:   71 / minute BP sitting:   106 / 68  (left arm) Cuff size:   large  Vitals Entered By: Boone Master CNA (Apr 05, 2010 10:07 AM)  O2 Flow:  Room air CC: New med calendar pt brought her meds with her, pt has no concerns Is Patient Diabetic? No Comments Meds and allergies reviewed Daytime contact number verified with patient. Boone Master CNA  Apr 05, 2010 10:09 AM  Ambulatory Pulse  Oximetry  Resting; HR__78__    02 Sat___97__  Lap1 (185 feet)   HR__78___   02 Sat__100___ Lap2 (185 feet)   HR_____   02 Sat_____    Lap3 (185 feet)   HR_____   02 Sat_____  ___Test Completed without Difficulty __X_Test Stopped due to: increased SOB and fatigue.  pt stopped to rest x3 during the test, each time pt's o2 level ranged from 98-100% ra.  Boone Master CNA  Apr 05, 2010 11:31 AM     Physical Exam  Additional Exam:  obese wf nad with classic belle indifference affect wt 223 March 02, 2010 > 219 Mar 22, 2010 >223 5/17 HEENT: nl dentition,  and orophanx. Mod nonspecific edema of both turbinates.  Nl external ear canals without cough reflex NECK :  without JVD/Nodes/TM/ nl carotid upstrokes bilaterally LUNGS: no acc muscle use, clear to A and P bilaterally without cough on insp or exp maneuvers CV:  RRR  no s3 or murmur or increase in P2, no edema  ABD:  soft and nontender with nl excursion in the supine position. No bruits or organomegaly, bowel sounds nl MS:  warm without deformities, calf tenderness, cyanosis or clubbing, neg homans sign       Pulmonary Function Test Date: 04/05/2010 11:00 AM Gender: Female  Pre-Spirometry FVC    Value: 2.66 L/min   % Pred: 91.70 % FEV1    Value: 1.99 L     Pred: 2.18 L     % Pred: 91.30 % FEV1/FVC  Value: 75.07 %     % Pred: 99.40 %  Impression & Recommendations:  Problem # 1:  DYSPNEA (ICD-786.09) ? etiology, w/u thus far has been unrevealing w/ neg CXR, labs (except for bnp at 212), spirometry today shows nml lung function.  She does have a hx of DVT, recent long car travel, w/ dyspnea and atypical right sided chest/upper abd pain. (ABD [place] neg) Will check CT chest to r/o PE and evaluate lungs.  IF this is neg may need to consider cardiac referral to r/o underlying card dz. She has low RF except for age. unsure of hx of dsyplipidemia or not.   Meds reviewed with pt education and computerized med calendar completed/adjusted.      Orders: Radiology Referral (Radiology) Spirometry w/Graph (94010) Est. Patient Level IV (04540)  Problem # 2:  CHEST PAIN (ICD-786.50)  Atypical right sided chest pain ?etiology [place] abd/labs neg.  no improvement w/ citrucel/PPI.  continue on same meds. monitor closely.   Orders: Est. Patient Level IV (98119) Prescription Created Electronically [id number])  Medications Added to Medication List This Visit: 1)  Cymbalta 60 Mg Cpep (Duloxetine hcl) .... Take 2 in the morning 2)  Budeprion  Xl 300 Mg Xr24h-tab (Bupropion hcl) .... Take once in the morning 3)  Prilosec 20 Mg Cpdr (Omeprazole) .... One before the first meal 4)  Citrucel Powd (Methylcellulose (laxative)) .[name].. 1 tbsp in the morning 5)  Vistaril 25 Mg Caps (Hydroxyzine pamoate) .[name].. 1 every 8 hr as needed 6)  Womens Laxative 5 Mg Tbec (Bisacodyl) .... Per box as needed 7)  Voltaren 1 % Gel (Diclofenac sodium) .... Apply as needed 8)  Ibuprofen 200 Mg Tabs (Ibuprofen) .... Per bottle  Complete Medication List: 1)  Cymbalta 60 Mg Cpep (Duloxetine hcl) .... Take 2 in the morning 2)  Budeprion Xl 300 Mg Xr24h-tab (Bupropion hcl) .... Take once in the morning 3)  Trazodone Hcl 100 Mg Tabs (Trazodone hcl) .... 4 at bedtime 4)  Multivitamins Tabs (Multiple vitamin) .[name].. 1 once daily 5)  Prilosec 20 Mg Cpdr (Omeprazole) .... One before the first meal 6)  Citrucel Powd (Methylcellulose (laxative)) .[name].. 1 tbsp in the morning 7)  Vistaril 25 Mg Caps (Hydroxyzine pamoate) .[name].. 1 every 8 hr as needed 8)  Womens Laxative 5 Mg Tbec (Bisacodyl) .... Per box as needed 9)  Voltaren 1 % Gel (Diclofenac sodium) .... Apply as needed 10)  Ibuprofen 200 Mg Tabs (Ibuprofen) .... Per bottle  Patient Instructions: 1)  We are setting you up for CT chest , I will call with results.  2)  Continue on current regimen.  3)  Follow med calendar closely and bring to each visit.  4)  Once CT results are back we will decide on next follow up  5)  Please  contact office for sooner follow up if symptoms do not improve or worsen    Immunization History:  Influenza Immunization History:    Influenza:  historical (08/20/2009)    CardioPerfect Spirometry  ID: [id number] Patient: RICCA, MELGAREJO DOB: [birth year] Age: 76 Years Old Sex: Female Race: White Height: 64 Weight: 223 Status: Unconfirmed Past Medical History:  HYPOTENSION (ICD-458.9) DEPRESSION/ANXIETY (ICD-300.4) Cough.............................................................Marland KitchenWert    - onset 08/2009 R ant CP     - onset 10/2009    - Abd [place] ordered Mar 22, 2010     Recorded: 04/05/2010 11:00 AM  Parameter  Measured Predicted %Predicted FVC     2.66        2.90        91.70 FEV1     1.99        2.18        91.30 FEV1%   75.07        75.51        99.40 PEF    2.46        5.46        45.20   Interpretation:

## 2010-12-20 NOTE — Medication Information (Signed)
Summary: rov/jk  Anticoagulant Therapy  Managed by: Weston Brass, PharmD Referring MD: Charlton Haws, MD PCP: Dr. Cleda Daub Supervising MD: Myrtis Ser MD, Tinnie Gens Indication 1: DVT Lab Used: [facility] [place] Site: [address] INR POC 2.2 INR RANGE 2-3  Dietary changes: no    Health status changes: yes       Details: Pt has fallen 3 times in the past week.  She has bruising on her legs from where she hit the bathtub.  Bleeding/hemorrhagic complications: yes       Details: Bruising from fall.   Recent/future hospitalizations: no    Any changes in medication regimen? no    Recent/future dental: no  Any missed doses?: yes     Details: Missed last Wednesday night's dose.  Took it early the next morning.  Is patient compliant with meds? yes       Allergies: No Known Drug Allergies  Anticoagulation Management History:      The patient is taking warfarin and comes in today for a routine follow up visit.  Positive risk factors for bleeding include an age of 74 years or older.  The bleeding index is 'intermediate risk'.  Negative CHADS2 values include Age > 66 years old.  Anticoagulation responsible provider: Myrtis Ser MD, Tinnie Gens.  INR POC: 2.2.  Cuvette Lot#: [id number].  Exp: 08/2011.    Anticoagulation Management Assessment/Plan:      The patient's current anticoagulation dose is Warfarin sodium 5 mg tabs: Use as directed by Anticoagulation Clinic.  The target INR is 2.0-3.0.  The next INR is due 08/15/2010.  Anticoagulation instructions were given to patient.  Results were reviewed/authorized by Weston Brass, PharmD.  She was notified by Gweneth Fritter, PharmD Candidate.         Prior Anticoagulation Instructions: INR 3.8  Skip today's dose.  Then resume taking 1 tablet (5mg ) daily except take 1.5 tablets (7.5mg ) on Wed.  Recheck in 3 weeks.  Current Anticoagulation Instructions: INR 2.2  Continue taking 1 tablet (5mg ) every day except take 1.5 tablets (7.5mg ) on  Wednesdays.  Recheck in 4 weeks.

## 2010-12-20 NOTE — Medication Information (Signed)
Summary: rov/tm  Anticoagulant Therapy  Managed by: Weston Brass, PharmD Referring MD: Charlton Haws, MD PCP: Dr. Cleda Daub Supervising MD: Daleen Squibb MD, Maisie Fus Indication 1: DVT Lab Used: [facility] [place] Site: [address] INR POC 3.1 INR RANGE 2-3  Dietary changes: yes       Details: Pt reports eating more green vegetables  Health status changes: no    Bleeding/hemorrhagic complications: no    Recent/future hospitalizations: no    Any changes in medication regimen? no    Recent/future dental: yes     Details: Teeth cleaning recently  Any missed doses?: yes     Details: Missed 1/2 dose on Wednesday, but took 1/2 tab extra on Thursday  Is patient compliant with meds? yes       Allergies: No Known Drug Allergies  Anticoagulation Management History:      The patient is taking warfarin and comes in today for a routine follow up visit.  Positive risk factors for bleeding include an age of 73 years or older.  The bleeding index is 'intermediate risk'.  Negative CHADS2 values include Age > 68 years old.  Anticoagulation responsible provider: Daleen Squibb MD, Maisie Fus.  INR POC: 3.1.  Cuvette Lot#: [id number].  Exp: 09/2011.    Anticoagulation Management Assessment/Plan:      The patient's current anticoagulation dose is Warfarin sodium 5 mg tabs: Use as directed by Anticoagulation Clinic.  The target INR is 2.0-3.0.  The next INR is due 09/26/2010.  Anticoagulation instructions were given to patient.  Results were reviewed/authorized by Weston Brass, PharmD.  She was notified by Haynes Hoehn, PharmD Candidate.         Prior Anticoagulation Instructions: INR 2.3 Continue 5mg s everyday except 7.5mg s on Wednesdays. Recheck in 4 weeks.   Current Anticoagulation Instructions: INR 3.1    Take 1/2 tablet today, then return to normal Coumadin schedule:  1 tablet every day of the week, except 1 and 1/2 tablets on Wednesday.  Return to clinic in 3 weeks.

## 2010-12-20 NOTE — Medication Information (Signed)
Summary: Andrea [name]  Anticoagulant Therapy  Managed by: Bethena Midget, RN, BSN Referring MD: Charlton Haws, MD PCP: Dr. Cleda Daub Supervising MD: Tenny Craw MD, Gunnar Fusi Indication 1: DVT Lab Used: [facility] [place] Site: [address] INR POC 2.3 INR RANGE 2-3  Dietary changes: no    Health status changes: no    Bleeding/hemorrhagic complications: no    Recent/future hospitalizations: no    Any changes in medication regimen? no    Recent/future dental: no  Any missed doses?: no       Is patient compliant with meds? yes       Allergies: No Known Drug Allergies  Anticoagulation Management History:      The patient is taking warfarin and comes in today for a routine follow up visit.  Positive risk factors for bleeding include an age of 74 years or older.  The bleeding index is 'intermediate risk'.  Negative CHADS2 values include Age > 74 years old.  Anticoagulation responsible provider: Tenny Craw MD, Gunnar Fusi.  INR POC: 2.3.  Cuvette Lot#: [id number].  Exp: 09/2011.    Anticoagulation Management Assessment/Plan:      The patient's current anticoagulation dose is Warfarin sodium 5 mg tabs: Use as directed by Anticoagulation Clinic.  The target INR is 2.0-3.0.  The next INR is due 09/05/2010.  Anticoagulation instructions were given to patient.  Results were reviewed/authorized by Bethena Midget, RN, BSN.  She was notified by Bethena Midget, RN, BSN.         Prior Anticoagulation Instructions: INR 2.2  Continue taking 1 tablet (5mg ) every day except take 1.5 tablets (7.5mg ) on Wednesdays.  Recheck in 4 weeks.   Current Anticoagulation Instructions: INR 2.3 Continue 5mg s everyday except 7.5mg s on Wednesdays. Recheck in 4 weeks.

## 2010-12-20 NOTE — Progress Notes (Signed)
Summary: appt  Phone Note Call from Patient Call back at Home Phone [phone]   Caller: Patient Call For: wert Reason for Call: Talk to Nurse Summary of Call: Having some test done in cardiology, does she need to wait until these are finishes to see Dr. Sherene Sires? Initial call taken by: Eugene Gavia,  April 22, 2010 2:09 PM  Follow-up for Phone Call        Spoke with pt.  She states that cards ordered stress test and she is having this done on 6/29.  Wants to know if she still needs to keep followup with Dr Sherene Sires on 05/05/10, or wait until after the stress test is done.  Please advise, thanks! Follow-up by: Vernie Murders,  April 22, 2010 2:41 PM  Additional Follow-up for Phone Call Additional follow up Details #1::        needs to keep appt because there's no evidence the pulmonary problem is cardiac in origin Additional Follow-up by: Nyoka Cowden MD,  April 22, 2010 3:30 PM    Additional Follow-up for Phone Call Additional follow up Details #2::    Spoke with pt and advised needs to keep planned ov with MW.  Pt verbalized understanding. Follow-up by: Vernie Murders,  April 22, 2010 3:36 PM

## 2010-12-20 NOTE — Progress Notes (Signed)
Summary: RETURNING CALL  Phone Note Call from Patient Call back at Home Phone [phone]   Caller: Patient Reason for Call: Talk to Nurse Summary of Call: RETURNING CALL Initial call taken by: Migdalia Dk,  April 25, 2010 4:56 PM  Follow-up for Phone Call        Merita Norton has been calling pt about appts, will have to clarify w/Dr Eden Emms in AM whether pt needs CT scan or not Meredith Staggers, RN  April 25, 2010 5:02 PM   Spoke with Dr. Eden Emms this morning. Pt doesn't need CHEST CT. Scheduled Ms. Schnee for lower venous duplex. She has an appt. today at 12 noon.  Follow-up by: Merita Norton Lloyd-Fate,  April 26, 2010 11:22 AM

## 2010-12-20 NOTE — Progress Notes (Signed)
Summary: return call  Phone Note Call from Patient Call back at Home Phone [phone]   Caller: Patient Call For: Amedeo Detweiler Reason for Call: Talk to Nurse Summary of Call: returned call to [facility] Initial call taken by: Eugene Gavia,  March 03, 2010 3:23 PM  Follow-up for Phone Call        pt advised per appends to cxr and labs. Carron Curie CMA  March 03, 2010 3:26 PM

## 2010-12-20 NOTE — Medication Information (Signed)
Summary: ccr/DVT/dm  Anticoagulant Therapy  Managed by: Jeralene Peters, PharmD Referring MD: Charlton Haws, MD PCP: Dr. Cleda Daub Supervising MD: Shirlee Latch MD, Freida Busman Indication 1: DVT Lab Used: [facility]  Site: [address] INR POC 1.6 INR RANGE 2-3  Dietary changes: no    Health status changes: no    Bleeding/hemorrhagic complications: no    Recent/future hospitalizations: no    Any changes in medication regimen? no    Recent/future dental: no  Any missed doses?: no       Is patient compliant with meds? yes      Comments: Starting taking Coumadin 5 mg/d on June 10.  Eats about 1 svg of greens everyday.  Takes Ibuprofen almost daily.  Current Medications (verified): 1)  Cymbalta 60 Mg Cpep (Duloxetine Hcl) .... Take 2 in The Morning 2)  Budeprion Xl 300 Mg Xr24h-Tab (Bupropion Hcl) .... 2 Tabs By Mouth Once Daily 3)  Trazodone Hcl 100 Mg Tabs (Trazodone Hcl) .... 4 At Bedtime 4)  Multivitamins  Tabs (Multiple Vitamin) .[name].. 1 Once Daily 5)  Citrucel  Powd (Methylcellulose (Laxative)) .[name].. 1 Tbsp in The Morning 6)  Voltaren 1 % Gel (Diclofenac Sodium) .... Apply As Needed 7)  Ibuprofen 200 Mg Tabs (Ibuprofen) .... As Needed 8)  Levothyroxine Sodium 50 Mcg Tabs (Levothyroxine Sodium) .[name].. 1  Tab By Mouth Once Daily 9)  Warfarin Sodium 5 Mg Tabs (Warfarin Sodium) .... Use As Directed By Anticoagulation Clinic  Allergies (verified): No Known Drug Allergies  Anticoagulation Management History:      The patient comes in today for her initial visit for anticoagulation therapy.  Positive risk factors for bleeding include an age of 40 years or older.  The bleeding index is 'intermediate risk'.  Negative CHADS2 values include Age > 28 years old.  Anticoagulation responsible provider: Shirlee Latch MD, Caelum Federici.  INR POC: 1.6.  Cuvette Lot#: [id number].  Exp: 07/2011.    Anticoagulation Management Assessment/Plan:      The patient's current anticoagulation dose is  Warfarin sodium 5 mg tabs: Use as directed by Anticoagulation Clinic.  The next INR is due 05/13/2010.  Anticoagulation instructions were given to patient.  Results were reviewed/authorized by Jeralene Peters, PharmD.         Current Anticoagulation Instructions: INR 1.6 (goal is 2-3)  Take an extra 1/2 tablet tonight then continue taking 1 tablet (5 mg) daily except take 1.5 tablets (7.5 mg) on Mondays and Wednesdays. Recheck on Friday.  -Coumadin interacts with many drugs like NSAIDs (Ibuprofen, Aleve, Naproxen, etc) and antibiotics.  Always tell all your doctors that you are on Coumadin.  Try to use Tylenol (acetaminophen) for pain. -Coumadin also interacts with dark green leafy vegetables (spinach, broccoli, cabbage, etc.) and food with lots of mayonnaise (chicken salad, coleslaw, etc.).  You do not have to limit the amount you eat but try to keep your diet consistent. -Side effect is bleeding.  Call doctor if experience excessive nose bleeding or notice blood after using bathroom. -Try to take coumadin in evening.

## 2010-12-20 NOTE — Progress Notes (Signed)
Summary: Nuclear pre procedure  Phone Note Outgoing Call Call back at [facility] Phone [phone]   Call placed by: Rea [employer], CMA,  April 27, 2010 3:40 PM Call placed to: Patient Summary of Call: Reviewed information on [employer] Information Sheet (see scanned document for further details).  Spoke with patient.      Nuclear Med Background Indications for Stress Test: Evaluation for Ischemia   History: Echo  History Comments: 04/26/10 Echo:EF=55-60%, mild MR; h/o DVT  Symptoms: Chest Pain, DOE  Symptoms Comments: .   Nuclear Pre-Procedure Cardiac Risk Factors: Family History - CAD, History of Smoking Height (in): 64

## 2010-12-20 NOTE — Assessment & Plan Note (Signed)
Summary: np3/chest pain   Referring Provider:  Dr. Cleda Daub Primary Provider:  Dr. Cleda Daub   History of Present Illness: Andrea [name] is a somwhat forgetful patient referred by Dr Sherene Sires for SOB, SSCP and history of DVT.  She has had a cough and SOB over the last couple of months.  This is associated with atypical chest pain initially right sided, sharp and not exertinal.  It is still persistant but improved.  She has a distant history of DVT and coumadin Rx.  CT scan done last month showed no PE but Biapical interstitial infiltrates ? pneumonia.  I don't see that she has had F/U for this and has not been on antibiotics.  She denies fever or active sputum production.  She has recently moved here from [place] but does not complain of a lot of allergies.  She has no histroy of CAD, valvular disease or CHF.  Her ECG is essentially normal with minor non-specific ST/T wave changes.  She likes to work out at [employer] but has been unable to lately She thinks her RLE is more swollen.  She is comppliant with her meds and has no other CRF's  Current Problems (verified): 1)  Chest Pain  (ICD-786.50) 2)  Dyspnea  (ICD-786.09) 3)  Cough  (ICD-786.2) 4)  Hypotension  (ICD-458.9) 5)  Depression/anxiety  (ICD-300.4)  Current Medications (verified): 1)  Cymbalta 60 Mg Cpep (Duloxetine Hcl) .... Take 2 in The Morning 2)  Budeprion Xl 300 Mg Xr24h-Tab (Bupropion Hcl) .... 2 Tabs By Mouth Once Daily 3)  Trazodone Hcl 100 Mg Tabs (Trazodone Hcl) .... 4 At Bedtime 4)  Multivitamins  Tabs (Multiple Vitamin) .[name].. 1 Once Daily 5)  Citrucel  Powd (Methylcellulose (Laxative)) .[name].. 1 Tbsp in The Morning 6)  Voltaren 1 % Gel (Diclofenac Sodium) .... Apply As Needed 7)  Ibuprofen 200 Mg Tabs (Ibuprofen) .... As Needed 8)  Levothyroxine Sodium 50 Mcg Tabs (Levothyroxine Sodium) .[name].. 1  Tab By Mouth Once Daily  Allergies (verified): No Known Drug Allergies  Past History:  Past Medical History: Last updated:  04/05/2010 HYPOTENSION (ICD-458.9) DEPRESSION/ANXIETY (ICD-300.4) Cough.............................................................Marland KitchenWert    - onset 08/2009 R ant CP     - onset 10/2009    - Abd [place] ordered Mar 22, 2010 >>neg  Past Surgical History: Last updated: 03/02/2010 Bilateral Knee Replacement Appendectomy  Family History: Last updated: 04/05/2010 Heart dz- Father passed at 20 DVT- Son  Social History: Last updated: 03/02/2010 Divorced Children Retired Charity fundraiser Former smoker.  Quit in 1970.  Smoked for approx 10 yrs. Rare ETOH Moved to [place] from [place] in Aug 2010.  Review of Systems       Denies fever, malais, weight loss, blurry vision, decreased visual acuity, , sputum, hemoptysis, pleuritic pain, palpitaitons, heartburn, abdominal pain, melena, lower extremity edema, claudication, or rash.   Vital Signs:  Patient profile:   76 year old female Height:      64 inches Weight:      217 pounds BMI:     37.38 Pulse rate:   70 / minute Resp:     12 per minute BP sitting:   108 / 70  (left arm)  Vitals Entered By: [name] (April 21, 2010 1:51 PM)  Physical Exam  General:  Affect appropriate Healthy:  appears stated age HEENT: normal Neck supple with no adenopathy JVP normal no bruits no thyromegaly Lungs clear with no wheezing and good diaphragmatic motion Heart:  S1/S2 no murmur,rub, gallop or click PMI  normal Abdomen: benighn, BS positve, no tenderness, no AAA no bruit.  No HSM or HJR Distal pulses intact with no bruits Trace RLE  edema Neuro non-focal Skin warm and dry    Impression & Recommendations:  Problem # 1:  CHEST PAIN (ICD-786.50) Atypical. May be related to interstitial lung process.  Myovue Orders: Nuclear Stress Test (Nuc Stress Test) TLB-Sedimentation Rate (ESR) (85652-ESR) T-D-Dimer Fibrin Derivatives Quantitive ([id number]) TLB-BNP (B-Natriuretic Peptide) (83880-BNPR)  Problem # 2:  DYSPNEA (ICD-786.09) Needs F/U  for abnormal CT.  No evidence of PE  Check d-dimer to make sure and ESR and BNP.  Echo but I suspect LV and RV function will be normal Orders: Echocardiogram (Echo)  Problem # 3:  DEPRESSION/ANXIETY (ICD-300.4) Seems to be quite demented.  F/U primary and consider adjusting CNS meds  Patient Instructions: 1)  Your physician recommends that you schedule a follow-up appointment in: AS NEEDED PENDING TEST RESULTS 2)  Your physician has requested that you have an echocardiogram.  Echocardiography is a painless test that uses sound waves to create images of your heart. It provides your doctor with information about the size and shape of your heart and how well your heart's chambers and valves are working.  This procedure takes approximately one hour. There are no restrictions for this procedure. 3)  Your physician has requested that you have an exercise stress myoview.  For further information please visit [email or web address]  Please follow instruction sheet, as given.   EKG Report  Procedure date:  03/22/2010  Findings:      NSR 65 Nonsp ST/T changes Essentially normal

## 2010-12-20 NOTE — Progress Notes (Signed)
Summary: test result and question about taking coumadin  Phone Note Call from Patient Call back at Home Phone [phone] Call back at [phone]   Caller: Patient Summary of Call: Pt calling for test results,and want to know why she need to take Coumadin Initial call taken by: Judie Grieve,  September 05, 2010 8:45 AM  Follow-up for Phone Call        Pt has questions as to why she is taking coumadin. I explained to her that it is because of DVT found on last doppler study. She has no additonal questions. Follow-up by: Dossie Arbour, RN, BSN,  September 05, 2010 11:03 AM

## 2010-12-20 NOTE — Progress Notes (Signed)
Summary: Nuc. Pre-Procedure  Phone Note Outgoing Call   Call placed by: Irean Hong, RN,  April 26, 2010 2:01 PM Summary of Call: The patient here for Echo, and LVE.The patient scheduled for myoview 04/28/10, written instructions given to patient. Nahia Nissan,RN.     Nuclear Med Background Indications for Stress Test: Evaluation for Ischemia   History: CT/MRI, Echo  History Comments: Hx. DVT  Symptoms: Chest Pain, DOE  Symptoms Comments: RLE edema, Cough.   Nuclear Pre-Procedure Cardiac Risk Factors: Family History - CAD, History of Smoking Height (in): 64

## 2010-12-22 NOTE — Medication Information (Signed)
Summary: rov/sp  Anticoagulant Therapy  Managed by: Reina Fuse, PharmD Referring MD: Charlton Haws, MD PCP: Dr. Cleda Daub Supervising MD: Nahser Indication 1: DVT Lab Used: [facility] [place] Site: [address] INR POC 1.4 INR RANGE 2-3  Dietary changes: yes       Details: Has eaten more leafy greens (spinach)  the past few weeks.   Health status changes: no    Bleeding/hemorrhagic complications: no    Recent/future hospitalizations: no    Any changes in medication regimen? no    Recent/future dental: no  Any missed doses?: yes     Details: MIssed dose on Tuesday.   Is patient compliant with meds? yes      Comments: Pt has not been to clinic since Oct. She missed her appt in Nov and reports she did not know to follow up. Pt has balance problems and has had several falls over the past few months. She reports that she has hit her head on some of these occassions, but gets checked out following her falls.   Allergies: No Known Drug Allergies  Anticoagulation Management History:      The patient is taking warfarin and comes in today for a routine follow up visit.  Positive risk factors for bleeding include an age of 74 years or older.  The bleeding index is 'intermediate risk'.  Negative CHADS2 values include Age > 89 years old.  Anticoagulation responsible provider: Nahser.  INR POC: 1.4.  Cuvette Lot#: [id number].  Exp: 09/2011.    Anticoagulation Management Assessment/Plan:      The patient's current anticoagulation dose is Warfarin sodium 5 mg tabs: Use as directed by Anticoagulation Clinic.  The target INR is 2.0-3.0.  The next INR is due 11/17/2010.  Anticoagulation instructions were given to patient.  Results were reviewed/authorized by Reina Fuse, PharmD.  She was notified by Reina Fuse PharmD.         Prior Anticoagulation Instructions: INR 3.1    Take 1/2 tablet today, then return to normal Coumadin schedule:  1 tablet every day of the week, except 1  and 1/2 tablets on Wednesday.  Return to clinic in 3 weeks.    Current Anticoagulation Instructions: INR 1.4  Today, Thursday, December 15th, take Coumadin 2 tabs (10 mg). Friday, December 16th, take Coumadin 1.5 tabs (7.5 mg). Then, continue taking Coumadin 1 tab (5 mg) on all days except for Coumadin 1.5 tabs (7.5 mg) on Wednesdays. Return to clinic in 2 weeks.

## 2010-12-22 NOTE — Medication Information (Signed)
Summary: rov/sl  Anticoagulant Therapy  Managed by: Cloyde Reams, RN, BSN Referring MD: Charlton Haws, MD PCP: Dr. Cleda Daub Supervising MD: Shirlee Latch MD, Freida Busman Indication 1: DVT Lab Used: [facility] [place] Site: [address] INR POC 2.7 INR RANGE 2-3  Dietary changes: no    Health status changes: no    Bleeding/hemorrhagic complications: no    Recent/future hospitalizations: no    Any changes in medication regimen? yes       Details: Off recent abx, took 2 days 6 doses and had swelling allergic rx.   Recent/future dental: no  Any missed doses?: yes     Details: Missed 1 dosage  Is patient compliant with meds? yes       Allergies (verified): 1)  ! Clindamycin Hcl (Clindamycin Hcl)  Anticoagulation Management History:      The patient is taking warfarin and comes in today for a routine follow up visit.  Positive risk factors for bleeding include an age of 73 years or older.  The bleeding index is 'intermediate risk'.  Negative CHADS2 values include Age > 58 years old.  Anticoagulation responsible provider: Shirlee Latch MD, [place].  INR POC: 2.7.  Cuvette Lot#: [id number].  Exp: 12/2011.    Anticoagulation Management Assessment/Plan:      The patient's current anticoagulation dose is Warfarin sodium 5 mg tabs: Use as directed by Anticoagulation Clinic.  The target INR is 2.0-3.0.  The next INR is due 12/09/2010.  Anticoagulation instructions were given to patient.  Results were reviewed/authorized by Cloyde Reams, RN, BSN.  She was notified by Cloyde Reams RN.         Prior Anticoagulation Instructions: INR 1.4  Today, Thursday, December 15th, take Coumadin 2 tabs (10 mg). Friday, December 16th, take Coumadin 1.5 tabs (7.5 mg). Then, continue taking Coumadin 1 tab (5 mg) on all days except for Coumadin 1.5 tabs (7.5 mg) on Wednesdays. Return to clinic in 2 weeks.     Current Anticoagulation Instructions: INR 2.7  Continue on same dosage 1 tablet daily  except 1.5 tablets on Wednesdays.  Recheck in 3 weeks.

## 2010-12-22 NOTE — Assessment & Plan Note (Signed)
Summary: f22m   Referring Provider:  Dr. Cleda Daub Primary Provider:  Dr. Cleda Daub   History of Present Illness: Andrea [name] is a somwhat forgetful patient referred by Dr Sherene Sires for SOB, SSCP and history of DVT.  She has had a cough and SOB over the last couple of months.  This is associated with atypical chest pain initially right sided, sharp and not exertinal.  It is still persistant but improved.  She has a distant history of DVT and coumadin Rx.  CT scan done last month showed no PE but Biapical interstitial infiltrates ? pneumonia.  I don't see that she has had F/U for this and has not been on antibiotics.  She denies fever or active sputum production.  She has recently moved here from [place] but does not complain of a lot of allergies.  She has no histroy of CAD, valvular disease or CHF.  Her ECG is essentially normal with minor non-specific ST/T wave changes.  She likes to work out at [employer] but has been unable to lately She thinks her RLE is more swollen.  She is comppliant with her meds and has no other CRF's  Current Problems (verified): 1)  Abnormal Lung Xray  (ICD-793.1) 2)  Edema  (ICD-782.3) 3)  Chest Pain  (ICD-786.50) 4)  Dyspnea  (ICD-786.09) 5)  Cough  (ICD-786.2) 6)  Hypotension  (ICD-458.9) 7)  Depression/anxiety  (ICD-300.4)  Current Medications (verified): 1)  Cymbalta 60 Mg Cpep (Duloxetine Hcl) .... Take 2 in The Morning 2)  Budeprion Xl 300 Mg Xr24h-Tab (Bupropion Hcl) .... 2 Tabs By Mouth Once Daily 3)  Trazodone Hcl 100 Mg Tabs (Trazodone Hcl) .... 4 At Bedtime 4)  Multivitamins  Tabs (Multiple Vitamin) .[name].. 1 Once Daily 5)  Citrucel  Powd (Methylcellulose (Laxative)) .[name].. 1 Tbsp in The Morning 6)  Voltaren 1 % Gel (Diclofenac Sodium) .... Apply As Needed 7)  Levothyroxine Sodium 50 Mcg Tabs (Levothyroxine Sodium) .[name].. 1  Tab By Mouth Once Daily 8)  Warfarin Sodium 5 Mg Tabs (Warfarin Sodium) .... Use As Directed By Anticoagulation Clinic 9)  Ambien .[name].. 1 Tab By  Mouth At Bedtime  Allergies (verified): 1)  ! Clindamycin Hcl (Clindamycin Hcl)  Past History:  Past Medical History: Last updated: 05/30/2010 HYPOTENSION (ICD-458.9) DEPRESSION/ANXIETY (ICD-300.4) Cough.............................................................Marland KitchenWert    - onset 08/2009 R ant CP     - onset 10/2009    - Abd [place] ordered Mar 22, 2010 >>neg    - nl myoview nuclear scan 04/28/10    - CT chest 04/06/10 subtle GG changes diffusely          with nl esr/bnp 04/21/10    - rec repeat cxr May 30, 2010 > left without getting this done as rec  Past Surgical History: Last updated: 03/02/2010 Bilateral Knee Replacement Appendectomy  Family History: Last updated: 04/05/2010 Heart dz- Father passed at 74 DVT- Son  Social History: Last updated: 03/02/2010 Divorced Children Retired Charity fundraiser Former smoker.  Quit in 1970.  Smoked for approx 10 yrs. Rare ETOH Moved to [place] from [place] in Aug 2010.  Review of Systems       Denies fever, malais, weight loss, blurry vision, decreased visual acuity, cough, sputum, SOB, hemoptysis, pleuritic pain, palpitaitons, heartburn, abdominal pain, melena, lower extremity edema, claudication, or rash.   Vital Signs:  Patient profile:   76 year old female Height:      64 inches Weight:      209 pounds BMI:  36.00 Pulse rate:   67 / minute Resp:     14 per minute BP sitting:   108 / 70  (left arm)  Vitals Entered By: [name] (December 09, 2010 9:41 AM)  Physical Exam  General:  Affect appropriate Healthy:  appears stated age HEENT: normal Neck supple with no adenopathy JVP normal no bruits no thyromegaly Lungs clear with no wheezing and good diaphragmatic motion Heart:  S1/S2 no murmur,rub, gallop or click PMI normal Abdomen: benighn, BS positve, no tenderness, no AAA no bruit.  No HSM or HJR Distal pulses intact with no bruits No edema Neuro non-focal Skin warm and dry    Impression &  Recommendations:  Problem # 1:  ABNORMAL LUNG XRAY (ICD-793.1) F/U Dr Sherene Sires.  COPD  Problem # 2:  CHEST PAIN (ICD-786.50) Atypical F/U echo Check D-dimer and LE venous duplex Her updated medication list for this problem includes:    Warfarin Sodium 5 Mg Tabs (Warfarin sodium) ..... Use as directed by anticoagulation clinic  Patient Instructions: 1)  Your physician wants you to follow-up in:5 MONTHS-JUNE  You will receive a reminder letter in the mail two months in advance. If you don't receive a letter, please call our office to schedule the follow-up appointment. 2)  Your physician has requested that you have a lower extremity venous duplex.  This test is an ultrasound of the veins in the legs or arms.  It looks at venous blood flow that carries blood from the heart to the legs or arms.  Allow one hour for a Lower Venous exam.  Allow thirty minutes for an Upper Venous exam. There are no restrictions or special instructions.SCHEDULE IN [place]

## 2010-12-22 NOTE — Medication Information (Signed)
Summary: rov/ewj  Anticoagulant Therapy  Managed by: Weston Brass, PharmD Referring MD: Charlton Haws, MD PCP: Dr. Cleda Daub Supervising MD: Shirlee Latch MD, Freida Busman Indication 1: DVT Lab Used: [facility] [place] Site: [address] INR POC 1.5 INR RANGE 2-3  Dietary changes: no    Health status changes: no    Bleeding/hemorrhagic complications: no    Recent/future hospitalizations: no    Any changes in medication regimen? no    Recent/future dental: no  Any missed doses?: yes     Details: Missed Tuesday 1/17  Is patient compliant with meds? yes       Current Medications (verified): 1)  Cymbalta 60 Mg Cpep (Duloxetine Hcl) .... Take 2 in The Morning 2)  Budeprion Xl 300 Mg Xr24h-Tab (Bupropion Hcl) .... 2 Tabs By Mouth Once Daily 3)  Trazodone Hcl 100 Mg Tabs (Trazodone Hcl) .... 4 At Bedtime 4)  Multivitamins  Tabs (Multiple Vitamin) .[name].. 1 Once Daily 5)  Prilosec 20 Mg Cpdr (Omeprazole) .[name].. 1 30 Min Before First Meal 6)  Citrucel  Powd (Methylcellulose (Laxative)) .[name].. 1 Tbsp in The Morning 7)  Voltaren 1 % Gel (Diclofenac Sodium) .... Apply As Needed 8)  Levothyroxine Sodium 50 Mcg Tabs (Levothyroxine Sodium) .[name].. 1  Tab By Mouth Once Daily 9)  Warfarin Sodium 5 Mg Tabs (Warfarin Sodium) .... Use As Directed By Anticoagulation Clinic 10)  Vistaril 25 Mg Caps (Hydroxyzine Pamoate) .[name].. 1 Every 8 Hours As Needed  Allergies (verified): 1)  ! Clindamycin Hcl (Clindamycin Hcl)  Anticoagulation Management History:      The patient is taking warfarin and comes in today for a routine follow up visit.  Positive risk factors for bleeding include an age of 74 years or older.  The bleeding index is 'intermediate risk'.  Negative CHADS2 values include Age > 74 years old.  Anticoagulation responsible provider: Shirlee Latch MD, Carmelina Balducci.  INR POC: 1.5.  Cuvette Lot#: [id number].  Exp: 11/2011.    Anticoagulation Management Assessment/Plan:      The patient's current  anticoagulation dose is Warfarin sodium 5 mg tabs: Use as directed by Anticoagulation Clinic.  The target INR is 2.0-3.0.  The next INR is due 12/23/2010.  Anticoagulation instructions were given to patient.  Results were reviewed/authorized by Weston Brass, PharmD.  She was notified by Stephannie Peters, PharmD Candidate .         Prior Anticoagulation Instructions: INR 2.7  Continue on same dosage 1 tablet daily except 1.5 tablets on Wednesdays.  Recheck in 3 weeks.   Current Anticoagulation Instructions: Coumadin 5 mg tablets - Take an extra half tablet today, then resume 1 tablet every day except 1.5 tablets on Wednesdays

## 2010-12-23 ENCOUNTER — Encounter: Payer: Self-pay | Admitting: Cardiology

## 2010-12-23 ENCOUNTER — Ambulatory Visit: Admit: 2010-12-23 | Payer: Self-pay

## 2010-12-23 ENCOUNTER — Encounter (INDEPENDENT_AMBULATORY_CARE_PROVIDER_SITE_OTHER): Payer: Medicare Other

## 2010-12-23 DIAGNOSIS — Z7901 Long term (current) use of anticoagulants: Secondary | ICD-10-CM

## 2010-12-23 DIAGNOSIS — I80299 Phlebitis and thrombophlebitis of other deep vessels of unspecified lower extremity: Secondary | ICD-10-CM

## 2010-12-23 LAB — CONVERTED CEMR LAB: POC INR: 2.2

## 2010-12-28 NOTE — Medication Information (Signed)
Summary: CCR/TMJ  Anticoagulant Therapy  Managed by: Bethena Midget, RN, BSN Referring MD: Charlton Haws, MD PCP: Dr. Cleda Daub Supervising MD: Jens Som MD, Arlys John Indication 1: DVT Lab Used: [facility] [place] Site: [address] INR POC 2.2 INR RANGE 2-3  Dietary changes: no    Health status changes: no    Bleeding/hemorrhagic complications: no    Recent/future hospitalizations: no    Any changes in medication regimen? yes       Details: Taking Amoxicillin for past tooth extraction last week, finishes tonight  Recent/future dental: yes     Details: Last week.   Any missed doses?: yes     Details: one dose missed early last week  Is patient compliant with meds? yes       Allergies: 1)  ! Clindamycin Hcl (Clindamycin Hcl)  Anticoagulation Management History:      The patient is taking warfarin and comes in today for a routine follow up visit.  Positive risk factors for bleeding include an age of 35 years or older.  The bleeding index is 'intermediate risk'.  Negative CHADS2 values include Age > 16 years old.  Anticoagulation responsible provider: Jens Som MD, Arlys John.  INR POC: 2.2.  Cuvette Lot#: [id number].  Exp: 11/2011.    Anticoagulation Management Assessment/Plan:      The patient's current anticoagulation dose is Warfarin sodium 5 mg tabs: Use as directed by Anticoagulation Clinic.  The target INR is 2.0-3.0.  The next INR is due 01/13/2011.  Anticoagulation instructions were given to patient.  Results were reviewed/authorized by Bethena Midget, RN, BSN.  She was notified by Bethena Midget, RN, BSN.         Prior Anticoagulation Instructions: Coumadin 5 mg tablets - Take an extra half tablet today, then resume 1 tablet every day except 1.5 tablets on Wednesdays   Current Anticoagulation Instructions: INR 2.2 Continue 1  pill everyday except 1.5 pill on Wednesdays. Recheck in 3 weeks.

## 2011-01-10 DIAGNOSIS — I82409 Acute embolism and thrombosis of unspecified deep veins of unspecified lower extremity: Secondary | ICD-10-CM | POA: Insufficient documentation

## 2011-01-13 ENCOUNTER — Encounter (INDEPENDENT_AMBULATORY_CARE_PROVIDER_SITE_OTHER): Payer: Medicare Other

## 2011-01-13 ENCOUNTER — Encounter: Payer: Self-pay | Admitting: Internal Medicine

## 2011-01-13 DIAGNOSIS — Z7901 Long term (current) use of anticoagulants: Secondary | ICD-10-CM

## 2011-01-13 DIAGNOSIS — I80299 Phlebitis and thrombophlebitis of other deep vessels of unspecified lower extremity: Secondary | ICD-10-CM

## 2011-01-13 LAB — CONVERTED CEMR LAB: POC INR: 3

## 2011-01-17 NOTE — Medication Information (Signed)
Summary: rov/tm  Anticoagulant Therapy  Managed by: Bethena Midget, RN, BSN Referring MD: Charlton Haws, MD PCP: Dr. Cleda Daub Supervising MD: Tenny Craw MD, Gunnar Fusi Indication 1: DVT Lab Used: [facility] [place] Site: [address] INR POC 3.0 INR RANGE 2-3  Dietary changes: no    Health status changes: no    Bleeding/hemorrhagic complications: no    Recent/future hospitalizations: no    Any changes in medication regimen? no    Recent/future dental: no  Any missed doses?: no       Is patient compliant with meds? yes       Allergies: 1)  ! Clindamycin Hcl (Clindamycin Hcl)  Anticoagulation Management History:      The patient is taking warfarin and comes in today for a routine follow up visit.  Positive risk factors for bleeding include an age of 74 years or older.  The bleeding index is 'intermediate risk'.  Negative CHADS2 values include Age > 77 years old.  Anticoagulation responsible provider: Tenny Craw MD, Gunnar Fusi.  INR POC: 3.0.  Cuvette Lot#: [id number].  Exp: 11/2011.    Anticoagulation Management Assessment/Plan:      The patient's current anticoagulation dose is Warfarin sodium 5 mg tabs: Use as directed by Anticoagulation Clinic.  The target INR is 2.0-3.0.  The next INR is due 02/10/2011.  Anticoagulation instructions were given to patient.  Results were reviewed/authorized by Bethena Midget, RN, BSN.  She was notified by Cloyde Reams RN.         Prior Anticoagulation Instructions: INR 2.2 Continue 1  pill everyday except 1.5 pill on Wednesdays. Recheck in 3 weeks.   Current Anticoagulation Instructions: INR 3.0 Today only take 1/2 pill then continue 1 pill everyday except 1.5 pills on Wednesdays. Recheck in 4 weeks.

## 2011-01-30 LAB — BASIC METABOLIC PANEL
BUN: 11 mg/dL (ref 6–23)
CO2: 26 mEq/L (ref 19–32)
Calcium: 8.5 mg/dL (ref 8.4–10.5)
Chloride: 97 mEq/L (ref 96–112)
Creatinine, Ser: 0.81 mg/dL (ref 0.4–1.2)
GFR calc Af Amer: >60 mL/min (ref >60)
GFR calc non Af Amer: >60 mL/min (ref >60)
Glucose, Bld: 99 mg/dL (ref 70–99)
Potassium: 4 mEq/L (ref 3.5–5.1)
Sodium: 130 mEq/L — ABNORMAL LOW (ref 135–145)

## 2011-01-30 LAB — DIFFERENTIAL
Basophils Absolute: 0 10*3/uL (ref 0.0–0.1)
Basophils Relative: 1 % (ref 0–1)
Eosinophils Absolute: 0.2 10*3/uL (ref 0.0–0.7)
Eosinophils Relative: 3 % (ref 0–5)
Lymphocytes Relative: 27 % (ref 12–46)
Lymphs Abs: 1.6 10*3/uL (ref 0.7–4.0)
Monocytes Absolute: 0.6 10*3/uL (ref 0.1–1.0)
Monocytes Relative: 10 % (ref 3–12)
Neutro Abs: 3.7 10*3/uL (ref 1.7–7.7)
Neutrophils Relative %: 60 % (ref 43–77)

## 2011-01-30 LAB — CBC
HCT: 37 % (ref 36.0–46.0)
Hemoglobin: 12.6 g/dL (ref 12.0–15.0)
MCH: 31.2 pg (ref 26.0–34.0)
MCHC: 34.1 g/dL (ref 30.0–36.0)
MCV: 91.6 fL (ref 78.0–100.0)
Platelets: 185 10*3/uL (ref 150–400)
RBC: 4.04 MIL/uL (ref 3.87–5.11)
RDW: 12.9 % (ref 11.5–15.5)
WBC: 6.2 10*3/uL (ref 4.0–10.5)

## 2011-01-30 LAB — BRAIN NATRIURETIC PEPTIDE: Pro B Natriuretic peptide (BNP): 59.4 pg/mL (ref 0.0–100.0)

## 2011-01-30 LAB — TROPONIN I: Troponin I: 0.02 ng/mL (ref 0.00–0.06)

## 2011-01-30 LAB — PROTIME-INR
INR: 1.79 — ABNORMAL HIGH (ref 0.00–1.49)
Prothrombin Time: 21 seconds — ABNORMAL HIGH (ref 11.6–15.2)

## 2011-02-10 ENCOUNTER — Ambulatory Visit (INDEPENDENT_AMBULATORY_CARE_PROVIDER_SITE_OTHER): Payer: Medicare Other | Admitting: *Deleted

## 2011-02-10 DIAGNOSIS — I82409 Acute embolism and thrombosis of unspecified deep veins of unspecified lower extremity: Secondary | ICD-10-CM

## 2011-02-10 DIAGNOSIS — Z7901 Long term (current) use of anticoagulants: Secondary | ICD-10-CM

## 2011-02-10 LAB — POCT INR: INR: 2.7

## 2011-02-10 NOTE — Patient Instructions (Signed)
Continue on same dosage 1 tablet daily except 1.5 tablets on Wednesdays.  Recheck in 4 weeks.

## 2011-03-10 ENCOUNTER — Ambulatory Visit (INDEPENDENT_AMBULATORY_CARE_PROVIDER_SITE_OTHER): Payer: Medicare Other | Admitting: *Deleted

## 2011-03-10 DIAGNOSIS — I82409 Acute embolism and thrombosis of unspecified deep veins of unspecified lower extremity: Secondary | ICD-10-CM

## 2011-03-10 LAB — POCT INR: INR: 2.6

## 2011-04-07 ENCOUNTER — Ambulatory Visit (INDEPENDENT_AMBULATORY_CARE_PROVIDER_SITE_OTHER): Payer: Medicare Other | Admitting: *Deleted

## 2011-04-07 DIAGNOSIS — I82409 Acute embolism and thrombosis of unspecified deep veins of unspecified lower extremity: Secondary | ICD-10-CM

## 2011-04-14 ENCOUNTER — Ambulatory Visit (INDEPENDENT_AMBULATORY_CARE_PROVIDER_SITE_OTHER): Payer: Medicare Other | Admitting: *Deleted

## 2011-04-14 DIAGNOSIS — I82409 Acute embolism and thrombosis of unspecified deep veins of unspecified lower extremity: Secondary | ICD-10-CM

## 2011-04-21 ENCOUNTER — Encounter: Payer: Medicare Other | Admitting: *Deleted

## 2011-04-24 ENCOUNTER — Encounter: Payer: Self-pay | Admitting: Cardiovascular Disease

## 2011-04-28 ENCOUNTER — Ambulatory Visit (INDEPENDENT_AMBULATORY_CARE_PROVIDER_SITE_OTHER): Payer: Medicare Other | Admitting: *Deleted

## 2011-04-28 DIAGNOSIS — I82409 Acute embolism and thrombosis of unspecified deep veins of unspecified lower extremity: Secondary | ICD-10-CM

## 2011-05-03 ENCOUNTER — Ambulatory Visit: Payer: Medicare Other | Admitting: Cardiovascular Disease

## 2011-05-05 ENCOUNTER — Ambulatory Visit (INDEPENDENT_AMBULATORY_CARE_PROVIDER_SITE_OTHER): Payer: Medicare Other | Admitting: *Deleted

## 2011-05-05 DIAGNOSIS — I82409 Acute embolism and thrombosis of unspecified deep veins of unspecified lower extremity: Secondary | ICD-10-CM

## 2011-05-08 ENCOUNTER — Ambulatory Visit (INDEPENDENT_AMBULATORY_CARE_PROVIDER_SITE_OTHER): Payer: Medicare Other | Admitting: Cardiovascular Disease

## 2011-05-08 ENCOUNTER — Encounter: Payer: Self-pay | Admitting: Cardiovascular Disease

## 2011-05-08 DIAGNOSIS — Z86718 Personal history of other venous thrombosis and embolism: Secondary | ICD-10-CM

## 2011-05-08 DIAGNOSIS — R600 Localized edema: Secondary | ICD-10-CM

## 2011-05-08 DIAGNOSIS — F341 Dysthymic disorder: Secondary | ICD-10-CM

## 2011-05-08 DIAGNOSIS — Z7901 Long term (current) use of anticoagulants: Secondary | ICD-10-CM

## 2011-05-08 DIAGNOSIS — R609 Edema, unspecified: Secondary | ICD-10-CM

## 2011-05-08 DIAGNOSIS — I82409 Acute embolism and thrombosis of unspecified deep veins of unspecified lower extremity: Secondary | ICD-10-CM

## 2011-05-08 NOTE — Assessment & Plan Note (Signed)
Continue for now but concerned about frequent falls.  Depending on this and Rx for depression may need to reconsider especially if F/U duplex shows resolution of DVT

## 2011-05-08 NOTE — Assessment & Plan Note (Signed)
F/U psychiatry.  Avoid Trazadone since it increases her risk of falling

## 2011-05-08 NOTE — Progress Notes (Signed)
Andrea [name] is a somwhat forgetful patient referred by Dr Sherene Sires 12/09/10  for SOB, SSCP and history of DVT. She has had a cough and SOB over the last couple of months. This is associated with atypical chest pain initially right sided, sharp and not exertinal. It is still persistant but improved. She has a distant history of DVT and coumadin Rx. CT scan done l12/11  showed no PE but Biapical interstitial infiltrates ? pneumonia She has recently moved here from [place] but does not complain of a lot of allergies.  She has no histroy of CAD, valvular disease or CHF. Her ECG is essentially normal with minor non-specific ST/T wave changes. She likes to work out at [employer] but has been unable to lately She sees a Therapist, sports for depression.  Not falling as much off Trazadone.  Larey Seat about 8 weeks ago and seen at ortho urgent care.  On antibiotics for cellulitis per GP.  Would suggest F/U venous duplex in 2-3 weeks.  Takes Lasix as needed if swelling is bad  Ok to stop coumadin with no Lovenox overlap for tooth extraction   She is comppliant with her meds and has no other CRF's  F/U CXR 03/02/10  with NAD  ROS: Denies fever, malais, weight loss, blurry vision, decreased visual acuity, cough, sputum, SOB, hemoptysis, pleuritic pain, palpitaitons, heartburn, abdominal pain, melena,  claudication, or rash.  All other systems reviewed and negative  General: Affect appropriate Healthy:  appears stated age HEENT: normal Neck supple with no adenopathy JVP normal no bruits no thyromegaly Lungs clear with no wheezing and good diaphragmatic motion Heart:  S1/S2 no murmur,rub, gallop or click PMI normal Abdomen: benighn, BS positve, no tenderness, no AAA no bruit.  No HSM or HJR Distal pulses intact with no bruits RLE plus 2 edema and plus one on left S/P bilateral knee replacements Neuro non-focal Skin warm and dry No muscular weakness   Current Outpatient Prescriptions  Medication Sig Dispense Refill  . buPROPion  (WELLBUTRIN XL) 300 MG 24 hr tablet Take 600 mg by mouth daily.        . diclofenac sodium (VOLTAREN) 1 % GEL Apply topically as needed.        . DULoxetine (CYMBALTA) 60 MG capsule Take 120 mg by mouth daily.        [name] levothyroxine (SYNTHROID, LEVOTHROID) 50 MCG tablet Take 50 mcg by mouth daily.        . multivitamin (THERAGRAN) per tablet Take 1 tablet by mouth daily.        [name] warfarin (COUMADIN) 5 MG tablet Take by mouth as directed.        [name] DISCONTD: methylcellulose oral powder Take by mouth daily. 1 tbsp in the morning       . DISCONTD: traZODone (DESYREL) 100 MG tablet Take 400 mg by mouth at bedtime.        [name] DISCONTD: Zolpidem Tartrate (AMBIEN PO) Take by mouth at bedtime.          Allergies  Clindamycin hcl  Electrocardiogram:  Assessment and Plan

## 2011-05-08 NOTE — Patient Instructions (Signed)
Your physician recommends that you schedule a follow-up appointment in: 6 MONTHS WITH DR. Eden Emms  Your physician has requested that you have a lower extremity venous duplex. This test is an ultrasound of the veins in the legs. It looks at venous blood flow that carries blood from the heart to the legs or arms. Allow one hour for a Lower Venous exam. Allow thirty minutes for an Upper Venous exam. There are no restrictions or special instructions.

## 2011-05-08 NOTE — Assessment & Plan Note (Signed)
Continue Lasix as needed F/U GP

## 2011-05-08 NOTE — Assessment & Plan Note (Signed)
F/U duplex.  Distal SFV and popliteal had residual thrombus on duplex 6/11

## 2011-05-19 ENCOUNTER — Ambulatory Visit (INDEPENDENT_AMBULATORY_CARE_PROVIDER_SITE_OTHER): Payer: Medicare Other | Admitting: *Deleted

## 2011-05-19 DIAGNOSIS — I82409 Acute embolism and thrombosis of unspecified deep veins of unspecified lower extremity: Secondary | ICD-10-CM

## 2011-05-22 ENCOUNTER — Encounter (INDEPENDENT_AMBULATORY_CARE_PROVIDER_SITE_OTHER): Payer: Medicare Other | Admitting: Cardiology

## 2011-05-22 DIAGNOSIS — Z86718 Personal history of other venous thrombosis and embolism: Secondary | ICD-10-CM

## 2011-05-22 DIAGNOSIS — R600 Localized edema: Secondary | ICD-10-CM

## 2011-05-22 DIAGNOSIS — L97909 Non-pressure chronic ulcer of unspecified part of unspecified lower leg with unspecified severity: Secondary | ICD-10-CM

## 2011-05-22 DIAGNOSIS — M79609 Pain in unspecified limb: Secondary | ICD-10-CM

## 2011-05-29 ENCOUNTER — Encounter: Payer: Self-pay | Admitting: Cardiovascular Disease

## 2011-05-30 ENCOUNTER — Other Ambulatory Visit: Payer: Self-pay | Admitting: Cardiovascular Disease

## 2011-06-01 ENCOUNTER — Ambulatory Visit: Payer: Self-pay | Admitting: Cardiovascular Disease

## 2011-06-02 ENCOUNTER — Encounter: Payer: Medicare Other | Admitting: *Deleted

## 2011-06-16 ENCOUNTER — Telehealth: Payer: Self-pay

## 2011-06-16 NOTE — Telephone Encounter (Signed)
Pt is getting ready to have oral surgery at Dr Ethelene Browns Biancardi's office and they are requesting last INR results and documentation pt is now off Coumadin.  Called pt verbal ok given to fax over requested info. Printed last INR result from 05/19/11 and anti-coag note from 06/01/11 stating Dr Eden Emms d/c Coumadin.Information faxed to  [phone].

## 2011-07-14 ENCOUNTER — Other Ambulatory Visit: Payer: Self-pay | Admitting: Family Medicine

## 2011-07-14 ENCOUNTER — Ambulatory Visit
Admission: RE | Admit: 2011-07-14 | Discharge: 2011-07-14 | Disposition: A | Discharge status: Home / Self Care | Payer: Medicare Other | Source: Ambulatory Visit | Attending: Family Medicine | Admitting: Family Medicine

## 2011-07-14 DIAGNOSIS — M542 Cervicalgia: Secondary | ICD-10-CM

## 2011-09-25 ENCOUNTER — Emergency Department (HOSPITAL_COMMUNITY): Payer: Medicare Other

## 2011-09-25 ENCOUNTER — Other Ambulatory Visit: Payer: Self-pay

## 2011-09-25 ENCOUNTER — Encounter (HOSPITAL_COMMUNITY): Payer: Self-pay | Admitting: Emergency Medicine

## 2011-09-25 ENCOUNTER — Inpatient Hospital Stay (HOSPITAL_COMMUNITY)
Admission: EM | Admit: 2011-09-25 | Discharge: 2011-09-28 | DRG: 552 | Disposition: A | Discharge status: Skilled Nursing Facility | Payer: Medicare Other | Attending: Internal Medicine | Admitting: Internal Medicine

## 2011-09-25 DIAGNOSIS — M549 Dorsalgia, unspecified: Secondary | ICD-10-CM | POA: Diagnosis present

## 2011-09-25 DIAGNOSIS — R519 Headache, unspecified: Secondary | ICD-10-CM | POA: Diagnosis not present

## 2011-09-25 DIAGNOSIS — H9190 Unspecified hearing loss, unspecified ear: Secondary | ICD-10-CM | POA: Diagnosis present

## 2011-09-25 DIAGNOSIS — Z9181 History of falling: Secondary | ICD-10-CM

## 2011-09-25 DIAGNOSIS — F05 Delirium due to known physiological condition: Secondary | ICD-10-CM | POA: Diagnosis present

## 2011-09-25 DIAGNOSIS — R5381 Other malaise: Secondary | ICD-10-CM | POA: Diagnosis present

## 2011-09-25 DIAGNOSIS — I959 Hypotension, unspecified: Secondary | ICD-10-CM | POA: Diagnosis present

## 2011-09-25 DIAGNOSIS — R296 Repeated falls: Secondary | ICD-10-CM

## 2011-09-25 DIAGNOSIS — H539 Unspecified visual disturbance: Secondary | ICD-10-CM | POA: Diagnosis not present

## 2011-09-25 DIAGNOSIS — Z8661 Personal history of infections of the central nervous system: Secondary | ICD-10-CM

## 2011-09-25 DIAGNOSIS — S7002XA Contusion of left hip, initial encounter: Secondary | ICD-10-CM

## 2011-09-25 DIAGNOSIS — Y999 Unspecified external cause status: Secondary | ICD-10-CM

## 2011-09-25 DIAGNOSIS — M5416 Radiculopathy, lumbar region: Secondary | ICD-10-CM

## 2011-09-25 DIAGNOSIS — R0989 Other specified symptoms and signs involving the circulatory and respiratory systems: Secondary | ICD-10-CM | POA: Diagnosis present

## 2011-09-25 DIAGNOSIS — E785 Hyperlipidemia, unspecified: Secondary | ICD-10-CM | POA: Diagnosis present

## 2011-09-25 DIAGNOSIS — R059 Cough, unspecified: Secondary | ICD-10-CM

## 2011-09-25 DIAGNOSIS — S7000XA Contusion of unspecified hip, initial encounter: Secondary | ICD-10-CM | POA: Diagnosis present

## 2011-09-25 DIAGNOSIS — R5383 Other fatigue: Secondary | ICD-10-CM | POA: Diagnosis present

## 2011-09-25 DIAGNOSIS — R05 Cough: Secondary | ICD-10-CM | POA: Diagnosis present

## 2011-09-25 DIAGNOSIS — M5380 Other specified dorsopathies, site unspecified: Secondary | ICD-10-CM | POA: Diagnosis present

## 2011-09-25 DIAGNOSIS — M47817 Spondylosis without myelopathy or radiculopathy, lumbosacral region: Principal | ICD-10-CM | POA: Diagnosis present

## 2011-09-25 DIAGNOSIS — I679 Cerebrovascular disease, unspecified: Secondary | ICD-10-CM | POA: Diagnosis present

## 2011-09-25 DIAGNOSIS — I4891 Unspecified atrial fibrillation: Secondary | ICD-10-CM | POA: Diagnosis present

## 2011-09-25 DIAGNOSIS — M542 Cervicalgia: Secondary | ICD-10-CM | POA: Diagnosis present

## 2011-09-25 DIAGNOSIS — F341 Dysthymic disorder: Secondary | ICD-10-CM | POA: Diagnosis present

## 2011-09-25 DIAGNOSIS — G8929 Other chronic pain: Secondary | ICD-10-CM | POA: Diagnosis present

## 2011-09-25 DIAGNOSIS — M62830 Muscle spasm of back: Secondary | ICD-10-CM

## 2011-09-25 DIAGNOSIS — R609 Edema, unspecified: Secondary | ICD-10-CM | POA: Diagnosis present

## 2011-09-25 DIAGNOSIS — H5316 Psychophysical visual disturbances: Secondary | ICD-10-CM | POA: Diagnosis present

## 2011-09-25 DIAGNOSIS — IMO0002 Reserved for concepts with insufficient information to code with codable children: Secondary | ICD-10-CM | POA: Diagnosis present

## 2011-09-25 DIAGNOSIS — Z86718 Personal history of other venous thrombosis and embolism: Secondary | ICD-10-CM

## 2011-09-25 DIAGNOSIS — M25569 Pain in unspecified knee: Secondary | ICD-10-CM | POA: Diagnosis present

## 2011-09-25 DIAGNOSIS — Z96659 Presence of unspecified artificial knee joint: Secondary | ICD-10-CM

## 2011-09-25 DIAGNOSIS — E871 Hypo-osmolality and hyponatremia: Secondary | ICD-10-CM | POA: Diagnosis present

## 2011-09-25 DIAGNOSIS — Y9289 Other specified places as the place of occurrence of the external cause: Secondary | ICD-10-CM

## 2011-09-25 DIAGNOSIS — Z6836 Body mass index (BMI) 36.0-36.9, adult: Secondary | ICD-10-CM

## 2011-09-25 DIAGNOSIS — E039 Hypothyroidism, unspecified: Secondary | ICD-10-CM | POA: Diagnosis present

## 2011-09-25 DIAGNOSIS — M1710 Unilateral primary osteoarthritis, unspecified knee: Secondary | ICD-10-CM | POA: Diagnosis present

## 2011-09-25 DIAGNOSIS — Z79899 Other long term (current) drug therapy: Secondary | ICD-10-CM

## 2011-09-25 DIAGNOSIS — R51 Headache: Secondary | ICD-10-CM

## 2011-09-25 DIAGNOSIS — E662 Morbid (severe) obesity with alveolar hypoventilation: Secondary | ICD-10-CM | POA: Diagnosis present

## 2011-09-25 HISTORY — DX: Anemia, unspecified: D64.9

## 2011-09-25 HISTORY — DX: Cardiac arrhythmia, unspecified: I49.9

## 2011-09-25 HISTORY — DX: Acute embolism and thrombosis of unspecified deep veins of unspecified lower extremity: I82.409

## 2011-09-25 HISTORY — DX: Sleep apnea, unspecified: G47.30

## 2011-09-25 HISTORY — DX: Reserved for inherently not codable concepts without codable children: IMO0001

## 2011-09-25 HISTORY — DX: Headache: R51

## 2011-09-25 HISTORY — DX: Pneumonia, unspecified organism: J18.9

## 2011-09-25 HISTORY — DX: Major depressive disorder, single episode, unspecified: F32.9

## 2011-09-25 HISTORY — DX: Anxiety disorder, unspecified: F41.9

## 2011-09-25 HISTORY — DX: Encounter for other specified aftercare: Z51.89

## 2011-09-25 HISTORY — DX: Cervicalgia: M54.2

## 2011-09-25 HISTORY — DX: Shortness of breath: R06.02

## 2011-09-25 HISTORY — DX: Hypothyroidism, unspecified: E03.9

## 2011-09-25 HISTORY — DX: Unspecified osteoarthritis, unspecified site: M19.90

## 2011-09-25 HISTORY — DX: Depression, unspecified: F32.A

## 2011-09-25 HISTORY — DX: Radiculopathy, lumbar region: M54.16

## 2011-09-25 LAB — CBC
Hemoglobin: 12.7 g/dL (ref 12.0–15.0)
MCH: 29.5 pg (ref 26.0–34.0)
MCH: 29.6 pg (ref 26.0–34.0)
MCHC: 33.3 g/dL (ref 30.0–36.0)
Platelets: 230 10*3/uL (ref 150–400)
Platelets: 240 10*3/uL (ref 150–400)
RBC: 4.29 MIL/uL (ref 3.87–5.11)
RDW: 13.6 % (ref 11.5–15.5)
WBC: 8.4 10*3/uL (ref 4.0–10.5)

## 2011-09-25 LAB — COMPREHENSIVE METABOLIC PANEL
AST: 19 U/L (ref 0–37)
Albumin: 3.7 g/dL (ref 3.5–5.2)
Alkaline Phosphatase: 102 U/L (ref 39–117)
BUN: 22 mg/dL (ref 6–23)
Potassium: 3.7 mEq/L (ref 3.5–5.1)
Sodium: 134 mEq/L — ABNORMAL LOW (ref 135–145)
Total Protein: 7 g/dL (ref 6.0–8.3)

## 2011-09-25 LAB — DIFFERENTIAL
Basophils Absolute: 0 10*3/uL (ref 0.0–0.1)
Basophils Relative: 0 % (ref 0–1)
Eosinophils Absolute: 0 10*3/uL (ref 0.0–0.7)
Monocytes Relative: 5 % (ref 3–12)
Neutro Abs: 11 10*3/uL — ABNORMAL HIGH (ref 1.7–7.7)
Neutrophils Relative %: 88 % — ABNORMAL HIGH (ref 43–77)

## 2011-09-25 LAB — GLUCOSE, CAPILLARY

## 2011-09-25 LAB — HEMOGLOBIN A1C
Hgb A1c MFr Bld: 5.4 % (ref <5.7)
Mean Plasma Glucose: 108 mg/dL (ref <117)

## 2011-09-25 LAB — URINALYSIS, ROUTINE W REFLEX MICROSCOPIC
Glucose, UA: NEGATIVE mg/dL
Ketones, ur: 15 mg/dL — AB
Leukocytes, UA: NEGATIVE
pH: 6 (ref 5.0–8.0)

## 2011-09-25 LAB — CREATININE, SERUM
Creatinine, Ser: 0.78 mg/dL (ref 0.50–1.10)
GFR calc Af Amer: >90 mL/min (ref >90)
GFR calc non Af Amer: 80 mL/min — ABNORMAL LOW (ref >90)

## 2011-09-25 LAB — PROTIME-INR: Prothrombin Time: 13.9 seconds (ref 11.6–15.2)

## 2011-09-25 LAB — URINE MICROSCOPIC-ADD ON

## 2011-09-25 LAB — CARDIAC PANEL(CRET KIN+CKTOT+MB+TROPI): Total CK: 618 U/L — ABNORMAL HIGH (ref 7–177)

## 2011-09-25 LAB — VITAMIN B12: Vitamin B-12: 1349 pg/mL — ABNORMAL HIGH (ref 211–911)

## 2011-09-25 MED ORDER — SODIUM CHLORIDE 0.9 % IJ SOLN
3.0000 mL | Freq: Two times a day (BID) | INTRAMUSCULAR | Status: DC
  Administered 2011-09-26 – 2011-09-28 (×5): 3 mL via INTRAVENOUS

## 2011-09-25 MED ORDER — MORPHINE SULFATE 2 MG/ML IJ SOLN
INTRAMUSCULAR | Status: AC
  Administered 2011-09-25: 2 mg via INTRAVENOUS
  Filled 2011-09-25: qty 1

## 2011-09-25 MED ORDER — ENOXAPARIN SODIUM 40 MG/0.4ML ~~LOC~~ SOLN
40.0000 mg | SUBCUTANEOUS | Status: DC
  Administered 2011-09-25 – 2011-09-27 (×3): 40 mg via SUBCUTANEOUS
  Filled 2011-09-25 (×4): qty 0.4

## 2011-09-25 MED ORDER — LEVOTHYROXINE SODIUM 50 MCG PO TABS
50.0000 ug | ORAL_TABLET | Freq: Every day | ORAL | Status: DC
  Administered 2011-09-26 – 2011-09-28 (×3): 50 ug via ORAL
  Filled 2011-09-25 (×4): qty 1

## 2011-09-25 MED ORDER — TRIAMCINOLONE ACETONIDE 0.1 % EX CREA
1.0000 "application " | TOPICAL_CREAM | Freq: Two times a day (BID) | CUTANEOUS | Status: DC
  Administered 2011-09-25 – 2011-09-28 (×6): 1 via TOPICAL
  Filled 2011-09-25 (×3): qty 15

## 2011-09-25 MED ORDER — SENNA 8.6 MG PO TABS: 2.0000 | ORAL_TABLET | Freq: Every day | ORAL | Status: DC | PRN

## 2011-09-25 MED ORDER — ZOLPIDEM TARTRATE 5 MG PO TABS
5.0000 mg | ORAL_TABLET | Freq: Every evening | ORAL | Status: DC | PRN
  Administered 2011-09-26: 5 mg via ORAL
  Filled 2011-09-25: qty 1

## 2011-09-25 MED ORDER — MORPHINE SULFATE 4 MG/ML IJ SOLN: 2.0000 mg | Freq: Once | INTRAMUSCULAR | Status: DC

## 2011-09-25 MED ORDER — ASPIRIN EC 81 MG PO TBEC
81.0000 mg | DELAYED_RELEASE_TABLET | Freq: Every day | ORAL | Status: DC
  Administered 2011-09-26 – 2011-09-28 (×3): 81 mg via ORAL
  Filled 2011-09-25 (×3): qty 1

## 2011-09-25 MED ORDER — DICLOFENAC SODIUM 1 % TD GEL
TRANSDERMAL | Status: DC | PRN
  Administered 2011-09-25: 15:00:00 via TOPICAL
  Filled 2011-09-25: qty 100

## 2011-09-25 MED ORDER — SODIUM CHLORIDE 0.9 % IV SOLN
250.0000 mL | INTRAVENOUS | Status: DC
  Administered 2011-09-25: 250 mL via INTRAVENOUS

## 2011-09-25 MED ORDER — SODIUM CHLORIDE 0.9 % IV SOLN
Freq: Once | INTRAVENOUS | Status: AC
  Administered 2011-09-25: 04:00:00 via INTRAVENOUS

## 2011-09-25 MED ORDER — ACETAMINOPHEN 650 MG RE SUPP: 650.0000 mg | Freq: Four times a day (QID) | RECTAL | Status: DC | PRN

## 2011-09-25 MED ORDER — THERA M PLUS PO TABS
1.0000 | ORAL_TABLET | Freq: Every day | ORAL | Status: DC
  Administered 2011-09-25 – 2011-09-28 (×4): 1 via ORAL
  Filled 2011-09-25 (×4): qty 1

## 2011-09-25 MED ORDER — ACETAMINOPHEN 325 MG PO TABS
650.0000 mg | ORAL_TABLET | Freq: Four times a day (QID) | ORAL | Status: DC | PRN
  Administered 2011-09-26: 650 mg via ORAL
  Filled 2011-09-25: qty 2

## 2011-09-25 MED ORDER — AMMONIUM LACTATE 12 % EX LOTN
1.0000 "application " | TOPICAL_LOTION | Freq: Every day | CUTANEOUS | Status: DC | PRN
  Filled 2011-09-25: qty 400

## 2011-09-25 MED ORDER — DULOXETINE HCL 60 MG PO CPEP
60.0000 mg | ORAL_CAPSULE | Freq: Every day | ORAL | Status: DC
  Administered 2011-09-25 – 2011-09-28 (×4): 60 mg via ORAL
  Filled 2011-09-25 (×5): qty 1

## 2011-09-25 MED ORDER — ALUM & MAG HYDROXIDE-SIMETH 200-200-20 MG/5ML PO SUSP: 30.0000 mL | Freq: Four times a day (QID) | ORAL | Status: DC | PRN

## 2011-09-25 MED ORDER — CALCIUM CARBONATE 1250 (500 CA) MG PO TABS
1250.0000 mg | ORAL_TABLET | Freq: Every day | ORAL | Status: DC
  Administered 2011-09-25 – 2011-09-27 (×3): 1250 mg via ORAL
  Filled 2011-09-25 (×7): qty 1

## 2011-09-25 MED ORDER — ONDANSETRON HCL 4 MG PO TABS: 4.0000 mg | ORAL_TABLET | Freq: Four times a day (QID) | ORAL | Status: DC | PRN

## 2011-09-25 MED ORDER — ONDANSETRON HCL 4 MG/2ML IJ SOLN: 4.0000 mg | Freq: Four times a day (QID) | INTRAMUSCULAR | Status: DC | PRN

## 2011-09-25 MED ORDER — SODIUM CHLORIDE 0.9 % IJ SOLN: 3.0000 mL | INTRAMUSCULAR | Status: DC | PRN

## 2011-09-25 MED ORDER — GADOBENATE DIMEGLUMINE 529 MG/ML IV SOLN
20.0000 mL | Freq: Once | INTRAVENOUS | Status: AC | PRN
  Administered 2011-09-25: 20 mL via INTRAVENOUS

## 2011-09-25 MED ORDER — OXYCODONE HCL 5 MG PO TABS
5.0000 mg | ORAL_TABLET | ORAL | Status: DC | PRN
  Administered 2011-09-26 – 2011-09-27 (×5): 5 mg via ORAL
  Filled 2011-09-25 (×5): qty 1

## 2011-09-25 MED ORDER — TEMAZEPAM 15 MG PO CAPS: 15.0000 mg | ORAL_CAPSULE | Freq: Every evening | ORAL | Status: DC | PRN

## 2011-09-25 MED ORDER — BUPROPION HCL ER (XL) 300 MG PO TB24
300.0000 mg | ORAL_TABLET | Freq: Every day | ORAL | Status: DC
  Administered 2011-09-25 – 2011-09-28 (×4): 300 mg via ORAL
  Filled 2011-09-25 (×5): qty 1

## 2011-09-25 NOTE — ED Notes (Signed)
Warm blankets provided to pt, awaiting bear hugger

## 2011-09-25 NOTE — H&P (Addendum)
Hospital Admission Note Date: 09/25/2011  Patient name: Novella Abraha Medical record number: [id number] Date of birth: 28-Nov-1934 Age: 76 y.o. Gender: female PCP: Karie Chimera, MD  Attending physician: Chip Canepa   Chief Complaint: Frequent falls  History of Present Illness: Kahlani Graber is an 76 y.o. female who presents to the hospital with her daughter with a chief complaint of frequent falls at home.  The patient lives alone and the E.D. Physician felt that she was unsafe to be discharged home due to the frequent falling.  This issue has been present for the past year, and has varied in intensity, but has gotten worse over the past week with 5 falls in the past 7 days.  The patient has been taken off coumadin due to these frequent falls, and has seen a neurologist in the past to evaluate this issue.  No cause was ever identified.  The patient denies any preceding dizziness, faintness, or other prodromal issues.  She has some generalized weakness, chronic back pain, chronic knee pain all of which may be contributory,  Past Medical History  Diagnosis Date  . Hypotension Hypothyroidism H/O DVT: Off coumadin secondary to high fall risk. Hearing impairment: Wears hearing aides.   . Depression with anxiety   . Cough     Wert-onset 08/2009   Meds: Prior to Admission medications   Medication Sig Start Date End Date Taking? Authorizing Provider  ammonium lactate (LAC-HYDRIN) 12 % lotion Apply 1 application topically daily as needed. FOR DRY SKIN    Yes Historical Provider, MD  buPROPion (WELLBUTRIN XL) 300 MG 24 hr tablet Take 300 mg by mouth daily.    Yes Historical Provider, MD  Calcium 1500 MG tablet Take 1,500 mg by mouth.     Yes Historical Provider, MD  DULoxetine (CYMBALTA) 60 MG capsule Take 60 mg by mouth daily.    Yes Historical Provider, MD  levothyroxine (SYNTHROID, LEVOTHROID) 50 MCG tablet Take 50 mcg by mouth daily.    Yes Historical Provider, MD  multivitamin [facility])  per tablet Take 1 tablet by mouth daily.    Yes Historical Provider, MD  temazepam (RESTORIL) 15 MG capsule Take 15 mg by mouth at bedtime as needed.    Yes Historical Provider, MD  triamcinolone (KENALOG) 0.1 % cream Apply 1 application topically 2 (two) times daily. MIXED IN CETAPHIL 1:1 AND THE DIRECTIONS ARE APPLY TO AFFECTED AREAS TWICE A DAY.    Yes Historical Provider, MD  amoxicillin-clavulanate (AUGMENTIN XR) 1000-62.5 MG per tablet Take 2 tablets by mouth 2 (two) times daily. For 7 days      Historical Provider, MD  ciprofloxacin (CIPRO) 250 MG tablet Take 250 mg by mouth daily. For 7 days     Historical Provider, MD  diclofenac sodium (VOLTAREN) 1 % GEL Apply topically as needed.     Historical Provider, MD  warfarin (COUMADIN) 5 MG tablet USE AS DIRECTED BY ANTICOAGULATION CLINIC. 05/30/11   Wendall Stade, MD              Allergies: Clindamycin hcl History   Social History  . Marital Status: Divorced    Spouse Name: N/A    Number of Children: 3  . Years of Education: N/A   Occupational History  . Retired Charity fundraiser    Social History Main Topics  . Smoking status: Former Smoker    Quit date: 1972 11/20/1968  . Smokeless tobacco: Not on file   Comment: smoked for approx. 10 years  . Alcohol Use: 1  drink per week. Yes     rare  . Drug Use: Not on file  . Sexually Active: Not on file   Other Topics Concern  . Not on file   Social History Narrative  . No narrative on file   Family History  Problem Relation Age of Onset  . Heart disease, died at 5 Father    Parkinson's Disease, died at 59 Mother    Heart disease Son x 2   . Deep vein thrombosis Son    Past Surgical History  Procedure Date  . Replacement total knee bilateral    Tonsillectomy/Adenoidectomy    Bladder Tack    Bilateral cateract    Breast bx x 3 (benign)    Back surgery   . Appendectomy    Review of Systems: Constitutional: No fever or chills, no weight loss, + weight gain over time.  HEENT:  Wears hearing aides.  Respiratory: + Dyspnea, + cough.  Cardiovascular: No chest pain, no palpitations.  GI: No nausea, vomiting, diarrhea, melena, hematochezia.  GU: Some dysuria last week.  No hematuria.  Neuro: + headaches, generalized weakness.  No focal motor deficits.  Psych: +Depression. Physical Exam: Blood pressure 93/52, pulse 79, temperature 97.6 F (36.4 C), temperature source Oral, resp. rate 12, SpO2 99.00%. General appearance: alert, cooperative, distracted and no distress Head: Normocephalic, without obvious abnormality, asymmetric shape, atraumatic Eyes: conjunctivae/corneas clear. PERRL, EOM's intact. Nose: Nares normal. Septum midline. Mucosa normal. No drainage Throat: lips, mucosa, and tongue normal; dentures present, gums normal Neck: no adenopathy, no carotid bruit, no JVD, supple, symmetrical, trachea midline and thyroid not enlarged, symmetric, no tenderness/mass/nodules Lungs: clear to auscultation bilaterally Heart: regular rate and rhythm, S1, S2 normal, no murmur, click, rub or gallop Abdomen: soft, non-tender; bowel sounds normal; no masses,  no organomegaly Extremities: edema 1+ with venous stasis dermatitis Pulses: 2+ and symmetric Skin: Skin color, texture, turgor normal. No rashes or lesions or Left forearm skin abrasion; Bilateral lower extremity ecchymosis Neurologic: Grossly normal Lab results: Results for orders placed during the hospital encounter of 09/25/11 (from the past 24 hour(s))  CBC     Status: Abnormal   Collection Time   09/25/11  5:10 AM      Component Value Range   WBC 12.5 (*) 4.0 - 10.5 (K/uL)   RBC 4.58  3.87 - 5.11 (MIL/uL)   Hemoglobin 13.5  12.0 - 15.0 (g/dL)   HCT 16.1  09.6 - 04.5 (%)   MCV 88.4  78.0 - 100.0 (fL)   MCH 29.5  26.0 - 34.0 (pg)   MCHC 33.3  30.0 - 36.0 (g/dL)   RDW 40.9  81.1 - 91.4 (%)   Platelets 230  150 - 400 (K/uL)  DIFFERENTIAL     Status: Abnormal   Collection Time   09/25/11  5:10 AM      Component  Value Range   Neutrophils Relative 88 (*) 43 - 77 (%)   Neutro Abs 11.0 (*) 1.7 - 7.7 (K/uL)   Lymphocytes Relative 7 (*) 12 - 46 (%)   Lymphs Abs 0.8  0.7 - 4.0 (K/uL)   Monocytes Relative 5  3 - 12 (%)   Monocytes Absolute 0.6  0.1 - 1.0 (K/uL)   Eosinophils Relative 0  0 - 5 (%)   Eosinophils Absolute 0.0  0.0 - 0.7 (K/uL)   Basophils Relative 0  0 - 1 (%)   Basophils Absolute 0.0  0.0 - 0.1 (K/uL)  COMPREHENSIVE METABOLIC PANEL  Status: Abnormal   Collection Time   09/25/11  5:10 AM      Component Value Range   Sodium 134 (*) 135 - 145 (mEq/L)   Potassium 3.7  3.5 - 5.1 (mEq/L)   Chloride 96  96 - 112 (mEq/L)   CO2 28  19 - 32 (mEq/L)   Glucose, Bld 122 (*) 70 - 99 (mg/dL)   BUN 22  6 - 23 (mg/dL)   Creatinine, Ser 1.61  0.50 - 1.10 (mg/dL)   Calcium 9.3  8.4 - 09.6 (mg/dL)   Total Protein 7.0  6.0 - 8.3 (g/dL)   Albumin 3.7  3.5 - 5.2 (g/dL)   AST 19  0 - 37 (U/L)   ALT 12  0 - 35 (U/L)   Alkaline Phosphatase 102  39 - 117 (U/L)   Total Bilirubin 0.5  0.3 - 1.2 (mg/dL)   GFR calc non Af Amer 83 (*) >90 (mL/min)   GFR calc Af Amer >90  >90 (mL/min)  PROTIME-INR     Status: Normal   Collection Time   09/25/11  5:10 AM      Component Value Range   Prothrombin Time 13.9  11.6 - 15.2 (seconds)   INR 1.05  0.00 - 1.49   APTT     Status: Normal   Collection Time   09/25/11  5:10 AM      Component Value Range   aPTT 31  24 - 37 (seconds)  GLUCOSE, CAPILLARY     Status: Abnormal   Collection Time   09/25/11  6:01 AM      Component Value Range   Glucose-Capillary 107 (*) 70 - 99 (mg/dL)   Comment 1 Documented in Chart     Comment 2 Notify RN    URINALYSIS, ROUTINE W REFLEX MICROSCOPIC     Status: Abnormal   Collection Time   09/25/11  6:14 AM      Component Value Range   Color, Urine YELLOW  YELLOW    Appearance CLEAR  CLEAR    Specific Gravity, Urine 1.017  1.005 - 1.030    pH 6.0  5.0 - 8.0    Glucose, UA NEGATIVE  NEGATIVE (mg/dL)   Hgb urine dipstick TRACE (*)  NEGATIVE    Bilirubin Urine NEGATIVE  NEGATIVE    Ketones, ur 15 (*) NEGATIVE (mg/dL)   Protein, ur NEGATIVE  NEGATIVE (mg/dL)   Urobilinogen, UA 0.2  0.0 - 1.0 (mg/dL)   Nitrite NEGATIVE  NEGATIVE    Leukocytes, UA NEGATIVE  NEGATIVE   URINE MICROSCOPIC-ADD ON     Status: Normal   Collection Time   09/25/11  6:14 AM      Component Value Range   Squamous Epithelial / LPF RARE  RARE    WBC, UA 0-2  <3 (WBC/hpf)   RBC / HPF 7-10  <3 (RBC/hpf)   Bacteria, UA RARE  RARE    Urine-Other MUCOUS PRESENT     Imaging results:  Dg Hip Complete Left  09/25/2011  *RADIOLOGY REPORT*  Clinical Data: Left hip pain status post fall.  LEFT HIP - COMPLETE 2+ VIEW  Comparison: None.  Findings: No displaced acute fracture or dislocation identified. No aggressive appearing osseous lesion.  The sacrum is partially obscured by overlying bowel gas artifact.  Degenerative changes of the lower lumbar spine.  IMPRESSION: No displaced fracture identified.  If clinical concern for a nondisplaced fracture persists, recommend MRI.  Original Report Authenticated By: Waneta Martins, M.D.  Ct Head Wo Contrast  09/25/2011  *RADIOLOGY REPORT*  Clinical Data: Status post fall  CT HEAD WITHOUT CONTRAST  Technique:  Contiguous axial images were obtained from the base of the skull through the vertex without contrast.  Comparison: 07/20/2010  Findings: Prominence of the sulci, cisterns, and ventricles, in keeping with volume loss. There are subcortical and periventricular white matter hypodensities, a nonspecific finding most often seen with chronic microangiopathic changes.  There is no evidence for acute hemorrhage, overt hydrocephalus, mass lesion, or abnormal extra-axial fluid collection.  No definite CT evidence for acute cortical based (large artery) infarction. The visualized paranasal sinuses and mastoid air cells are predominately clear. No displaced calvarial fracture.  IMPRESSION: White matter hypodensities are most in  keeping with chronic microangiopathic change.  No definite acute intracranial abnormality.  Original Report Authenticated By: Waneta Martins, M.D.    Assessment & Plan: Active Problems:  Falls frequently: Unclear etiology.  Will try to obtain records from her previous neurological evaluation.  Will get PT/OT evaluations.  Will check TSH and B12 levels. Atrial fibrillation: She is currently rate controlled.  She states her M.D. Took her off coumadin and she is not a good coumadin candidate due to her frequent falls.  Will continue her on low dose aspirin and get her cardiologist to see her.  Will check her TSH to make sure she is not overly replaced on her synthroid dose.  Will cycle cardiac markers Q 8 hours x 3 sets and obtain a pro-BNP level as her dyspnea may be due to CHF.  DEPRESSION/ANXIETY: Continue her anti-depressant therapy.  Edema: She is not hypoalbuminemic.  Likely related to her morbid obesity.  Cough: No active cough at present, monitor.  CVD (cerebrovascular disease): Daily low dose aspirin.  Check lipids.  Hyponatremia: Mild, monitor.  If worsens, check osmolality studies. HYPOTENSION: Not on any rx that would cause a drop in BP.  Monitor.  DYSPNEA: Will try to obtain records from Dr. Sherene Sires.  Likely has a component of OHS given her morbid obesity.  May be diastolic dysfunction related to her atrial fibrillation.  EF was 55-60% on Echo done last year. Morbid obesity: Dietician consult for weight loss. Hypothyroidism: Check TSH. Delirium: The patient has become disoriented upon arrival to the floor.  I will check an MRI/MRA to rule out a cerebrovascular event.   Carlyle Achenbach 09/25/2011, 9:38 AM

## 2011-09-25 NOTE — ED Provider Notes (Addendum)
History     CSN: [id number] Arrival date & time: 09/25/2011  2:57 AM   First MD Initiated Contact with Patient 09/25/11 [phone]      Chief Complaint  Patient presents with  . Fall    (Consider location/radiation/quality/duration/timing/severity/associated sxs/prior treatment) HPI Comments: 76 year old female with a history of frequent falls, extensive evaluation by neurology prior to visit today in the past years, presents after fall from car. She parked the car tried to get out and fell on her left hip. She was unable to get up and called ambulance with her life alert. She denies head injury but does have pain in her left hip. She states that she has fallen 5/7 days this week and is unsure why she is falling. She denies palpitations, chest pain, headache, loss of consciousness, blurred vision or numbness or weakness. She does admit to having some weakness in her left leg. Symptoms are intermittent, persistent over the week, not associated with fevers, nothing makes better or worse.  Patient is a 76 y.o. female presenting with fall. The history is provided by the patient, a relative, the EMS personnel and medical records.  Fall    Past Medical History  Diagnosis Date  . Hypotension   . Depression with anxiety   . Cough     Wert-onset 08/2009    Past Surgical History  Procedure Date  . Replacement total knee bilateral   . Appendectomy     Family History  Problem Relation Age of Onset  . Heart disease Father   . Deep vein thrombosis Son     History  Substance Use Topics  . Smoking status: Former Smoker    Quit date: 11/20/1968  . Smokeless tobacco: Not on file   Comment: smoked for approx. 10 years  . Alcohol Use: Yes     rare    OB History    Grav Para Term Preterm Abortions TAB SAB Ect Mult Living                  Review of Systems  All other systems reviewed and are negative.    Allergies  Clindamycin hcl  Home Medications   Current Outpatient Rx    Name Route Sig Dispense Refill  . BUPROPION HCL ER (XL) 300 MG PO TB24 Oral Take 300 mg by mouth daily.     . DULOXETINE HCL 60 MG PO CPEP Oral Take 60 mg by mouth daily.     [name] LEVOTHYROXINE SODIUM 50 MCG PO TABS Oral Take 50 mcg by mouth daily.      . MULTIVITAMINS PO TABS Oral Take 1 tablet by mouth daily.      [name] TEMAZEPAM 15 MG PO CAPS Oral Take 15 mg by mouth at bedtime as needed.      . AMOXICILLIN-POT CLAVULANATE 1000-62.5 MG PO TB12 Oral Take 2 tablets by mouth 2 (two) times daily. For 7 days      . CIPROFLOXACIN HCL 250 MG PO TABS Oral Take 250 mg by mouth daily. For 7 days     . DICLOFENAC SODIUM 1 % TD GEL Topical Apply topically as needed.      . WARFARIN SODIUM 5 MG PO TABS  USE AS DIRECTED BY ANTICOAGULATION CLINIC. 40 tablet 2    BP 93/52  Pulse 79  Temp(Src) 97.6 F (36.4 C) (Oral)  Resp 12  SpO2 99%  Physical Exam  Nursing note and vitals reviewed. Constitutional: She appears well-developed and well-nourished. No distress.  HENT:  Head: Normocephalic and atraumatic.  Mouth/Throat: Oropharynx is clear and moist. No oropharyngeal exudate.  Eyes: Conjunctivae and EOM are normal. Pupils are equal, round, and reactive to light. Right eye exhibits no discharge. Left eye exhibits no discharge. No scleral icterus.  Neck: Normal range of motion. Neck supple. No JVD present. No thyromegaly present.  Cardiovascular: Normal rate, regular rhythm, normal heart sounds and intact distal pulses.  Exam reveals no gallop and no friction rub.   No murmur heard. Pulmonary/Chest: Effort normal and breath sounds normal. No respiratory distress. She has no wheezes. She has no rales.  Abdominal: Soft. Bowel sounds are normal. She exhibits no distension and no mass. There is no tenderness.  Musculoskeletal: She exhibits tenderness ( Tenderness with range of motion of the left hip, no shortening or rotation. No signs of bruising, hematoma, laceration.). She exhibits no edema.   Lymphadenopathy:    She has no cervical adenopathy.  Neurological: She is alert. Coordination normal.  Skin: Skin is warm and dry. No rash noted. No erythema.  Psychiatric: She has a normal mood and affect. Her behavior is normal.    ED Course  Procedures (including critical care time)  Labs Reviewed  CBC - Abnormal; Notable for the following:    WBC 12.5 (*)    All other components within normal limits  DIFFERENTIAL - Abnormal; Notable for the following:    Neutrophils Relative 88 (*)    Neutro Abs 11.0 (*)    Lymphocytes Relative 7 (*)    All other components within normal limits  COMPREHENSIVE METABOLIC PANEL - Abnormal; Notable for the following:    Sodium 134 (*)    Glucose, Bld 122 (*)    GFR calc non Af Amer 83 (*)    All other components within normal limits  URINALYSIS, ROUTINE W REFLEX MICROSCOPIC - Abnormal; Notable for the following:    Hgb urine dipstick TRACE (*)    Ketones, ur 15 (*)    All other components within normal limits  GLUCOSE, CAPILLARY - Abnormal; Notable for the following:    Glucose-Capillary 107 (*)    All other components within normal limits  PROTIME-INR  APTT  URINE MICROSCOPIC-ADD ON  URINE CULTURE  POCT CBG MONITORING    1. Contusion of left hip   2. Falls frequently       MDM  We had imaging workup reveals no acute findings, vital signs are normal. Because of her in ability to ambulate in her frequent falls I have admitted her to the hospitalist. Bed request made   Results for orders placed during the hospital encounter of 09/25/11  CBC      Component Value Range   WBC 12.5 (*) 4.0 - 10.5 (K/uL)   RBC 4.58  3.87 - 5.11 (MIL/uL)   Hemoglobin 13.5  12.0 - 15.0 (g/dL)   HCT 16.1  09.6 - 04.5 (%)   MCV 88.4  78.0 - 100.0 (fL)   MCH 29.5  26.0 - 34.0 (pg)   MCHC 33.3  30.0 - 36.0 (g/dL)   RDW 40.9  81.1 - 91.4 (%)   Platelets 230  150 - 400 (K/uL)  DIFFERENTIAL      Component Value Range   Neutrophils Relative 88 (*) 43 -  77 (%)   Neutro Abs 11.0 (*) 1.7 - 7.7 (K/uL)   Lymphocytes Relative 7 (*) 12 - 46 (%)   Lymphs Abs 0.8  0.7 - 4.0 (K/uL)   Monocytes Relative 5  3 - 12 (%)  Monocytes Absolute 0.6  0.1 - 1.0 (K/uL)   Eosinophils Relative 0  0 - 5 (%)   Eosinophils Absolute 0.0  0.0 - 0.7 (K/uL)   Basophils Relative 0  0 - 1 (%)   Basophils Absolute 0.0  0.0 - 0.1 (K/uL)  COMPREHENSIVE METABOLIC PANEL      Component Value Range   Sodium 134 (*) 135 - 145 (mEq/L)   Potassium 3.7  3.5 - 5.1 (mEq/L)   Chloride 96  96 - 112 (mEq/L)   CO2 28  19 - 32 (mEq/L)   Glucose, Bld 122 (*) 70 - 99 (mg/dL)   BUN 22  6 - 23 (mg/dL)   Creatinine, Ser 0.98  0.50 - 1.10 (mg/dL)   Calcium 9.3  8.4 - 11.9 (mg/dL)   Total Protein 7.0  6.0 - 8.3 (g/dL)   Albumin 3.7  3.5 - 5.2 (g/dL)   AST 19  0 - 37 (U/L)   ALT 12  0 - 35 (U/L)   Alkaline Phosphatase 102  39 - 117 (U/L)   Total Bilirubin 0.5  0.3 - 1.2 (mg/dL)   GFR calc non Af Amer 83 (*) >90 (mL/min)   GFR calc Af Amer >90  >90 (mL/min)  URINALYSIS, ROUTINE W REFLEX MICROSCOPIC      Component Value Range   Color, Urine YELLOW  YELLOW    Appearance CLEAR  CLEAR    Specific Gravity, Urine 1.017  1.005 - 1.030    pH 6.0  5.0 - 8.0    Glucose, UA NEGATIVE  NEGATIVE (mg/dL)   Hgb urine dipstick TRACE (*) NEGATIVE    Bilirubin Urine NEGATIVE  NEGATIVE    Ketones, ur 15 (*) NEGATIVE (mg/dL)   Protein, ur NEGATIVE  NEGATIVE (mg/dL)   Urobilinogen, UA 0.2  0.0 - 1.0 (mg/dL)   Nitrite NEGATIVE  NEGATIVE    Leukocytes, UA NEGATIVE  NEGATIVE   PROTIME-INR      Component Value Range   Prothrombin Time 13.9  11.6 - 15.2 (seconds)   INR 1.05  0.00 - 1.49   APTT      Component Value Range   aPTT 31  24 - 37 (seconds)  GLUCOSE, CAPILLARY      Component Value Range   Glucose-Capillary 107 (*) 70 - 99 (mg/dL)   Comment 1 Documented in Chart     Comment 2 Notify RN    URINE MICROSCOPIC-ADD ON      Component Value Range   Squamous Epithelial / LPF RARE  RARE    WBC,  UA 0-2  <3 (WBC/hpf)   RBC / HPF 7-10  <3 (RBC/hpf)   Bacteria, UA RARE  RARE    Urine-Other MUCOUS PRESENT     Dg Hip Complete Left  09/25/2011  *RADIOLOGY REPORT*  Clinical Data: Left hip pain status post fall.  LEFT HIP - COMPLETE 2+ VIEW  Comparison: None.  Findings: No displaced acute fracture or dislocation identified. No aggressive appearing osseous lesion.  The sacrum is partially obscured by overlying bowel gas artifact.  Degenerative changes of the lower lumbar spine.  IMPRESSION: No displaced fracture identified.  If clinical concern for a nondisplaced fracture persists, recommend MRI.  Original Report Authenticated By: Waneta Martins, M.D.   Ct Head Wo Contrast  09/25/2011  *RADIOLOGY REPORT*  Clinical Data: Status post fall  CT HEAD WITHOUT CONTRAST  Technique:  Contiguous axial images were obtained from the base of the skull through the vertex without contrast.  Comparison: 07/20/2010  Findings: Prominence of the sulci, cisterns, and ventricles, in keeping with volume loss. There are subcortical and periventricular white matter hypodensities, a nonspecific finding most often seen with chronic microangiopathic changes.  There is no evidence for acute hemorrhage, overt hydrocephalus, mass lesion, or abnormal extra-axial fluid collection.  No definite CT evidence for acute cortical based (large artery) infarction. The visualized paranasal sinuses and mastoid air cells are predominately clear. No displaced calvarial fracture.  IMPRESSION: White matter hypodensities are most in keeping with chronic microangiopathic change.  No definite acute intracranial abnormality.  Original Report Authenticated By: Waneta Martins, M.D.      Vida Roller, MD 09/25/11 [phone]  ED ECG REPORT   Date: 09/25/2011   Rate: 79  Rhythm: normal sinus rhythm  QRS Axis: normal  Intervals: QT prolonged  ST/T Wave abnormalities: normal  Conduction Disutrbances:none  Narrative Interpretation:   Old EKG  Reviewed: changes noted QT prolonged today c/w 11/05/10   Vida Roller, MD 09/25/11 [phone]

## 2011-09-25 NOTE — ED Notes (Signed)
ZOX:WRUE<AV> Expected date:09/25/11<BR> Expected time: 2:54 AM<BR> Means of arrival:Ambulance<BR> Comments:<BR> Pt fell out of car/found laying in parking lot

## 2011-09-25 NOTE — ED Notes (Signed)
Report given to Consulting civil engineer. Will transport pt via stretcher to room.

## 2011-09-25 NOTE — ED Notes (Signed)
Pt turned and repositioned. Extra pillow provided.

## 2011-09-25 NOTE — Consult Note (Signed)
CARDIOLOGY CONSULT NOTE  Patient ID: Andrea [name] MRN: [id number] DOB/AGE: Oct 10, 1935 76 y.o.  Admit date: 09/25/2011 Referring Physician RAMA Primary PhysicianREESE,BETTI D, MD Primary Cardiologist Nishan Reason for Consultation atrial fibrillation Principal Problem:  *Falls frequently Active Problems:  DEPRESSION/ANXIETY  HYPOTENSION  Edema  DYSPNEA  Cough  CVD (cerebrovascular disease)  Hyponatremia  Obesity, morbid  Hypothyroidism  Atrial fibrillation  Delirium  HPI: The patient is a pleasant 76 yo woman with a h/o recurrent falls who presented to the ED with recurrent falls. She is not sure why she is falling. She denies loss of consciousness. No seizure like activity. She had an ECG in the ER today which was read as atrial fibrillation. This was reviewed by me and found to be incorrect. Her rhythm NSR. I have reivewed her chart and while she is at risk for atrial fib, it was not documented by Dr. Eden Emms who saw her last several months ago. She denies c/p or sob. She has very bad arthritis, worse in her knees. She admits to problems with anxiety.   Review of systems complete and found to be negative unless listed above   Past Medical History  Diagnosis Date  . Hypotension   . Depression with anxiety   . Cough     Wert-onset 08/2009    Family History  Problem Relation Age of Onset  . Heart disease Father   . Deep vein thrombosis Son     History   Social History  . Marital Status: Widowed    Spouse Name: N/A    Number of Children: N/A  . Years of Education: N/A   Occupational History  . Retired Charity fundraiser    Social History Main Topics  . Smoking status: Former Smoker    Quit date: 11/20/1968  . Smokeless tobacco: Not on file   Comment: smoked for approx. 10 years  . Alcohol Use: Yes     rare  . Drug Use: Not on file  . Sexually Active: Not on file   Other Topics Concern  . Not on file   Social History Narrative  . No narrative on file    Past Surgical  History  Procedure Date  . Replacement total knee bilateral   . Appendectomy       (Not in a hospital admission)  Physical Exam: Blood pressure 97/41, pulse 91, temperature 97.6 F (36.4 C), temperature source Oral, resp. rate 12, SpO2 95.00%.   This SmartLink requires parameters. Parameters are variables that are added to the [facility] name to request specific information. The parameter for lastwt is the number of readings to display.  For example: .lastwt[4  In this example, the SmartLink displays the last four encounter readings.   Diskempt appearing obese woman, NAD HEENT: Unremarkable Neck:  No JVD, no thyromegally Lungs:  Clear with no wheezes, rales, or rhonchi HEART:  Regular rate rhythm, no murmurs, no rubs, no clicks Abd:  Flat, positive bowel sounds, no organomegally, no rebound, no guarding Ext:  2 plus pulses, 1+ edema bilaterally, no cyanosis, no clubbing Skin:  No rashes no nodules Neuro:  CN II through XII intact, motor grossly intact Labs:   Lab Results  Component Value Date   WBC 12.5* 09/25/2011   HGB 13.5 09/25/2011   HCT 40.5 09/25/2011   MCV 88.4 09/25/2011   PLT 230 09/25/2011    Lab 09/25/11 0510  NA 134*  K 3.7  CL 96  CO2 28  BUN 22  CREATININE 0.71  CALCIUM 9.3  PROT 7.0  BILITOT 0.5  ALKPHOS 102  ALT 12  AST 19  GLUCOSE 122*   Lab Results  Component Value Date   CKTOTAL 62 03/02/2010   CKMB 1.2 03/02/2010   TROPONINI  Value: 0.02        NO INDICATION OF MYOCARDIAL INJURY. 11/05/2010    No results found for this basename: CHOL   No results found for this basename: HDL   No results found for this basename: LDLCALC   No results found for this basename: TRIG   No results found for this basename: CHOLHDL   No results found for this basename: LDLDIRECT      Radiology: Dg Hip Complete Left  09/25/2011  *RADIOLOGY REPORT*  Clinical Data: Left hip pain status post fall.  LEFT HIP - COMPLETE 2+ VIEW  Comparison: None.  Findings: No  displaced acute fracture or dislocation identified. No aggressive appearing osseous lesion.  The sacrum is partially obscured by overlying bowel gas artifact.  Degenerative changes of the lower lumbar spine.  IMPRESSION: No displaced fracture identified.  If clinical concern for a nondisplaced fracture persists, recommend MRI.  Original Report Authenticated By: Waneta Martins, M.D.   Ct Head Wo Contrast  09/25/2011  *RADIOLOGY REPORT*  Clinical Data: Status post fall  CT HEAD WITHOUT CONTRAST  Technique:  Contiguous axial images were obtained from the base of the skull through the vertex without contrast.  Comparison: 07/20/2010  Findings: Prominence of the sulci, cisterns, and ventricles, in keeping with volume loss. There are subcortical and periventricular white matter hypodensities, a nonspecific finding most often seen with chronic microangiopathic changes.  There is no evidence for acute hemorrhage, overt hydrocephalus, mass lesion, or abnormal extra-axial fluid collection.  No definite CT evidence for acute cortical based (large artery) infarction. The visualized paranasal sinuses and mastoid air cells are predominately clear. No displaced calvarial fracture.  IMPRESSION: White matter hypodensities are most in keeping with chronic microangiopathic change.  No definite acute intracranial abnormality.  Original Report Authenticated By: Waneta Martins, M.D.   Mr Laqueta Jean Wo Contrast  09/25/2011  *RADIOLOGY REPORT*  Clinical Data: Altered mental status.  Fell on driveway.  Confused.  MRI HEAD WITHOUT AND WITH CONTRAST  Technique:  Multiplanar, multiecho pulse sequences of the brain and surrounding structures were obtained according to standard protocol without and with intravenous contrast  Contrast: 20mL MULTIHANCE GADOBENATE DIMEGLUMINE 529 MG/ML IV SOLN  Comparison: CT head earlier in the day.  MRI head 08/30/2010.  Findings: The patient was moderately uncooperative and could not remain motionless  for the study.  Images are suboptimal.  Small or subtle lesions could be overlooked.  There is no evidence for acute infarction, intracranial hemorrhage, mass lesion, hydrocephalus, or extra-axial fluid. Moderate to severe atrophy is present.  Advanced chronic microvascular ischemic change is present in the periventricular and subcortical white matter.  No midline shift.  Patent cerebral vasculature.  Bilateral cataract extraction.  No acute sinus or mastoid disease.  The appearance is unchanged from prior CT. No change from 2011 MRI.  IMPRESSION: Atrophy and small vessel disease.  No acute intracranial findings.  Original Report Authenticated By: Elsie Stain, M.D.   EKG: NSR  ASSESSMENT AND PLAN:  1. Atrial fibrillation - she has maintained NSR. Her ECG is in error. I would recommend telemetry. Please call if she has true atrial fib or if she has any significant bradycardia. 2. Falls - It seems unlikely that she has bradycardia as the etiology  of her falls as she has had more than 50! I would observe on telemetry.  We will sign off. Call for questions. Signed: Lewayne Bunting, M.D. @ME1 @ 09/25/2011, 2:19 PM Co-Sign MD

## 2011-09-25 NOTE — ED Notes (Signed)
Report given to [place], Rn-3 [place]

## 2011-09-25 NOTE — ED Notes (Signed)
Daughter Yolandra Habig [phone] if needed; will return shortly

## 2011-09-25 NOTE — ED Notes (Signed)
Acuity increased, MD made aware

## 2011-09-25 NOTE — ED Notes (Signed)
Pt arrives via EMS, pt fell from car in driveway, c/o left hip pain, pt alert, GCS 15, +ETOH

## 2011-09-26 ENCOUNTER — Inpatient Hospital Stay (HOSPITAL_COMMUNITY): Payer: Medicare Other

## 2011-09-26 ENCOUNTER — Encounter (HOSPITAL_COMMUNITY): Payer: Self-pay | Admitting: Internal Medicine

## 2011-09-26 DIAGNOSIS — M5416 Radiculopathy, lumbar region: Secondary | ICD-10-CM

## 2011-09-26 DIAGNOSIS — H539 Unspecified visual disturbance: Secondary | ICD-10-CM | POA: Diagnosis not present

## 2011-09-26 DIAGNOSIS — M62830 Muscle spasm of back: Secondary | ICD-10-CM | POA: Diagnosis not present

## 2011-09-26 DIAGNOSIS — E1169 Type 2 diabetes mellitus with other specified complication: Secondary | ICD-10-CM | POA: Diagnosis present

## 2011-09-26 HISTORY — DX: Radiculopathy, lumbar region: M54.16

## 2011-09-26 LAB — CBC
MCH: 29.2 pg (ref 26.0–34.0)
MCHC: 33.4 g/dL (ref 30.0–36.0)
Platelets: 224 10*3/uL (ref 150–400)
RDW: 14 % (ref 11.5–15.5)

## 2011-09-26 LAB — BASIC METABOLIC PANEL
Calcium: 9.2 mg/dL (ref 8.4–10.5)
GFR calc Af Amer: >90 mL/min (ref >90)
GFR calc non Af Amer: 81 mL/min — ABNORMAL LOW (ref >90)
Potassium: 3.7 mEq/L (ref 3.5–5.1)
Sodium: 137 mEq/L (ref 135–145)

## 2011-09-26 LAB — CARDIAC PANEL(CRET KIN+CKTOT+MB+TROPI)
CK, MB: 3.1 ng/mL (ref 0.3–4.0)
Relative Index: 0.6 (ref 0.0–2.5)
Relative Index: 0.8 (ref 0.0–2.5)
Total CK: 483 U/L — ABNORMAL HIGH (ref 7–177)
Total CK: 399 U/L — ABNORMAL HIGH (ref 7–177)

## 2011-09-26 LAB — LIPID PANEL
HDL: 56 mg/dL (ref >39)
LDL Cholesterol: 112 mg/dL — ABNORMAL HIGH (ref 0–99)

## 2011-09-26 LAB — URINE CULTURE
Colony Count: NO GROWTH
Culture  Setup Time: 201211050850
Culture: NO GROWTH

## 2011-09-26 MED ORDER — DIAZEPAM 2 MG PO TABS
2.0000 mg | ORAL_TABLET | Freq: Four times a day (QID) | ORAL | Status: DC
  Administered 2011-09-26 – 2011-09-27 (×3): 2 mg via ORAL
  Filled 2011-09-26 (×3): qty 1

## 2011-09-26 MED ORDER — SIMVASTATIN 20 MG PO TABS
20.0000 mg | ORAL_TABLET | Freq: Every day | ORAL | Status: DC
  Administered 2011-09-26 – 2011-09-27 (×2): 20 mg via ORAL
  Filled 2011-09-26 (×3): qty 1

## 2011-09-26 NOTE — Progress Notes (Signed)
Subjective: Andrea [name] worked with PT today and reports that she did well.  She is having painful back and neck spasms. Patient also states that she is having visual disturbances when she moves her neck and looks to the extreme right. She states she sees feathers when looking to the right. When I discussed with her the option of owing to a rehabilitation facility for intensive physical therapy, the patient felt that she could safely manage at home as her son apparently is going to move in with her to provide her with supervision at home.  Objective:  Vital signs in last 24 hours:  Filed Vitals:   09/25/11 1119 09/25/11 2101 09/25/11 2223 09/26/11 0604  BP: 97/41 112/56 115/68 136/62  Pulse: 91 82 79 75  Temp:  99.4 F (37.4 C) 98.2 F (36.8 C) 97.3 F (36.3 C)  TempSrc:  Oral Oral Oral  Resp:  16 18 16   Height:   5\' 5"  (1.651 m)   Weight:   99.791 kg (220 lb)   SpO2: 95% 98% 96% 99%    Intake/Output from previous day:   Intake/Output Summary (Last 24 hours) at 09/26/11 1253 Last data filed at 09/26/11 0606  Gross per 24 hour  Intake      0 ml  Output   3700 ml  Net  -3700 ml    Physical Exam: General Appearance:    Alert, cooperative, no distress, appears stated age  Lungs:     Clear to auscultation bilaterally, respirations unlabored   Heart:    Regular rate and rhythm, S1 and S2 normal, no murmur, rub   or gallop  Abdomen:     Soft, non-tender, bowel sounds active all four quadrants,    no masses, no organomegaly  Extremities:   Extremities normal, atraumatic, no cyanosis, 1+edema  Pulses:   2+ and symmetric all extremities    Lab Results:  Basic Metabolic Panel:    Component Value Date/Time   NA 137 09/26/2011 0500   K 3.7 09/26/2011 0500   CL 101 09/26/2011 0500   CO2 26 09/26/2011 0500   BUN 14 09/26/2011 0500   CREATININE 0.75 09/26/2011 0500   GLUCOSE 111* 09/26/2011 0500   CALCIUM 9.2 09/26/2011 0500   CBC:    Component Value Date/Time   WBC 6.7 09/26/2011  0500   HGB 12.5 09/26/2011 0500   HCT 37.4 09/26/2011 0500   PLT 224 09/26/2011 0500   MCV 87.4 09/26/2011 0500   NEUTROABS 11.0* 09/25/2011 0510   LYMPHSABS 0.8 09/25/2011 0510   MONOABS 0.6 09/25/2011 0510   EOSABS 0.0 09/25/2011 0510   BASOSABS 0.0 09/25/2011 0510    Recent Results (from the past 240 hour(s))  URINE CULTURE     Status: Normal   Collection Time   09/25/11  6:14 AM      Component Value Range Status Comment   Specimen Description URINE, CATHETERIZED   Final    Special Requests NONE   Final    Setup Time [id number]   Final    Colony Count NO GROWTH   Final    Culture NO GROWTH   Final    Report Status 09/26/2011 FINAL   Final     Studies/Results: Dg Hip Complete Left  09/25/2011  *RADIOLOGY REPORT*  Clinical Data: Left hip pain status post fall.  LEFT HIP - COMPLETE 2+ VIEW  Comparison: None.  Findings: No displaced acute fracture or dislocation identified. No aggressive appearing osseous lesion.  The sacrum is partially  obscured by overlying bowel gas artifact.  Degenerative changes of the lower lumbar spine.  IMPRESSION: No displaced fracture identified.  If clinical concern for a nondisplaced fracture persists, recommend MRI.  Original Report Authenticated By: Waneta Martins, M.D.   Ct Head Wo Contrast  09/25/2011  *RADIOLOGY REPORT*  Clinical Data: Status post fall  CT HEAD WITHOUT CONTRAST  Technique:  Contiguous axial images were obtained from the base of the skull through the vertex without contrast.  Comparison: 07/20/2010  Findings: Prominence of the sulci, cisterns, and ventricles, in keeping with volume loss. There are subcortical and periventricular white matter hypodensities, a nonspecific finding most often seen with chronic microangiopathic changes.  There is no evidence for acute hemorrhage, overt hydrocephalus, mass lesion, or abnormal extra-axial fluid collection.  No definite CT evidence for acute cortical based (large artery) infarction. The visualized  paranasal sinuses and mastoid air cells are predominately clear. No displaced calvarial fracture.  IMPRESSION: White matter hypodensities are most in keeping with chronic microangiopathic change.  No definite acute intracranial abnormality.  Original Report Authenticated By: Waneta Martins, M.D.   Mr Laqueta Jean Wo Contrast  09/25/2011  *RADIOLOGY REPORT*  Clinical Data: Altered mental status.  Fell on driveway.  Confused.  MRI HEAD WITHOUT AND WITH CONTRAST  Technique:  Multiplanar, multiecho pulse sequences of the brain and surrounding structures were obtained according to standard protocol without and with intravenous contrast  Contrast: 20mL MULTIHANCE GADOBENATE DIMEGLUMINE 529 MG/ML IV SOLN  Comparison: CT head earlier in the day.  MRI head 08/30/2010.  Findings: The patient was moderately uncooperative and could not remain motionless for the study.  Images are suboptimal.  Small or subtle lesions could be overlooked.  There is no evidence for acute infarction, intracranial hemorrhage, mass lesion, hydrocephalus, or extra-axial fluid. Moderate to severe atrophy is present.  Advanced chronic microvascular ischemic change is present in the periventricular and subcortical white matter.  No midline shift.  Patent cerebral vasculature.  Bilateral cataract extraction.  No acute sinus or mastoid disease.  The appearance is unchanged from prior CT. No change from 2011 MRI.  IMPRESSION: Atrophy and small vessel disease.  No acute intracranial findings.  Original Report Authenticated By: Elsie Stain, M.D.    Medications: Scheduled Meds:   . aspirin EC  81 mg Oral Daily  . buPROPion  300 mg Oral Daily  . calcium carbonate  1,250 mg Oral Daily  . DULoxetine  60 mg Oral Daily  . enoxaparin  40 mg Subcutaneous Q24H  . levothyroxine  50 mcg Oral Q breakfast  . multivitamins ther. w/minerals  1 tablet Oral Daily  . sodium chloride  3 mL Intravenous Q12H  . triamcinolone  1 application Topical BID    Continuous Infusions:   . sodium chloride 250 mL (09/25/11 2045)   PRN Meds:.acetaminophen, acetaminophen, alum & mag hydroxide-simeth, ammonium lactate, diclofenac sodium, gadobenate dimeglumine, ondansetron (ZOFRAN) IV, ondansetron, oxyCODONE, senna, sodium chloride, zolpidem, DISCONTD: temazepam  Assessment/Plan:  Principal Problem:  *Falls frequently: Last saw Dr. Geraldine Contras in June.  She apparently had a MRI of the lumbar spine done 10/11.  Spoke with Dr. Geraldine Contras who stated that her falls were attributed to lumbar radiculopathy related to osteoarthritis.  There was moderate foraminal stenosis at multiple levels on MRI of the lumbar spine, but no spinal stenosis.  We have checked her TSH and B12 levels, both of which are normal.  Continue PT/OT.  The patient indicates that her son is coming to live with  her so that she will have better support at home. Active Problems:  HYPOTENSION: The patient's blood pressure has been stable.  DYSPNEA: The patient's oxygen saturations have been 95-99% on room air.  Likely has a component of OHS given her morbid obesity.  EF was 55-60% on Echo done last year.  Atrial fibrillation: The patient was evaluated by Dr. Ladona Ridgel who did not feel that her EKG was consistent with atrial fibrillation. She has no known history of atrial fibrillation in the past.  Delirium: The patient did develop acute delirium yesterday prompting [place] to order an MRI of her brain which was essentially normal.  Her mentation has cleared and she is currently back to baseline. Visual disturbance: Will check an MRI of her cervical spine to evaluate.  DEPRESSION/ANXIETY: Continue Cymbalta and Wellbutrin.  Edema: Thought to be secondary to her obesity.  Cough: No current active cough at this time.  CVD (cerebrovascular disease): The patient had an MRI of the brain done which did not show any acute events.  Continue aspirin and risk factor reduction. Add a statin to address her LDL  elevation.zo  Hyponatremia: Normalized.  The patient's sodium is 137 today.  Obesity, morbid: Hgb A1C is 5.4.  Dietician consult pending.  Hypothyroidism: The patient's TSH is normal at 1.468.  Continue current dose of synthroid. Dyslipidemia:  The patient's LDL is 112.  Goal LDL is less than 100.  Start statin therapy. Lumbar radiculopathy: Was not a candidate for aggressive intervention or surgery per Dr. Geraldine Contras. Muscle spasm of back: Likely related to fall and resultant musculoskeletal injury.  Low dose valium will be ordered.    LOS: 1 day   Suanne Minahan 09/26/2011, 12:53 PM

## 2011-09-26 NOTE — Progress Notes (Addendum)
Pt restless/confused up until 0100, at which time she fell asleep

## 2011-09-26 NOTE — Progress Notes (Signed)
Nutrition Consult for Education  Pt expressed interest in learning about heart healthy nutrition. Provided pt with heart healthy diet education and handout of this information. Pt admits to using too much salt on foods at home. Explained low salt options for adding flavor to foods and discussed ways to limit sodium, cholesterol, and fat intake. Pt expressed understanding. Provided pt with RD phone number to call with further questions.   Dietitian # [phone]

## 2011-09-26 NOTE — Progress Notes (Signed)
[id number]/Rhonda [name]/Utilization review of Chart performed. 

## 2011-09-26 NOTE — Progress Notes (Signed)
Physical Therapy Evaluation Patient Details Name: Andrea [name] MRN: [id number] DOB: 1935-10-11 Today's Date: 09/26/2011 11:18-11:42, EV2  Problem List:  Patient Active Problem List  Diagnoses  . DEPRESSION/ANXIETY  . HYPOTENSION  . Edema  . DYSPNEA  . Cough  . CHEST PAIN  . Nonspecific (abnormal) findings on radiological and other examination of body structure  . ABNORMAL LUNG XRAY  . DVT (deep venous thrombosis)  . Encounter for long-term (current) use of anticoagulants  . Falls frequently  . CVD (cerebrovascular disease)  . Hyponatremia  . Obesity, morbid  . Hypothyroidism  . Atrial fibrillation  . Delirium    Past Medical History:  Past Medical History  Diagnosis Date  . Hypotension   . Depression with anxiety   . Cough     Wert-onset 08/2009  . Deep venous thrombosis   . Cervicalgia   . Hypothyroidism   . Blood transfusion   . Constipation   . Shortness of breath   . Sleep apnea   . Arthritis   . Anxiety   . Dysrhythmia   . Pneumonia   . Anemia   . Depression    Past Surgical History:  Past Surgical History  Procedure Date  . Replacement total knee bilateral   . Appendectomy   . Breast excisional biopsy     breast bxs on left x 2, breast bx of right x 1-all benign  . Laparoscopic ovarian cystectomy     PT Assessment/Plan/Recommendation PT Assessment Clinical Impression Statement: Pt presents with decreased overall strength, decreased functional mobility, and increased R shld and neck pain. PT Recommendation/Assessment: Patient will need skilled PT in the acute care venue PT Problem List: Decreased strength;Decreased activity tolerance;Pain;Decreased mobility PT Therapy Diagnosis : Difficulty walking;Acute pain PT Plan PT Frequency: Min 3X/week PT Treatment/Interventions: Gait training;DME instruction;Functional mobility training;Therapeutic exercise;Balance training PT Recommendation Follow Up Recommendations: Skilled nursing facility Equipment  Recommended: None recommended by PT PT Goals  Acute Rehab PT Goals PT Goal Formulation: With patient Time For Goal Achievement: 2 weeks Pt will go Supine/Side to Sit: with HOB 0 degrees;with supervision PT Goal: Supine/Side to Sit - Progress: Progressing toward goal Pt will Transfer Sit to Stand/Stand to Sit: with supervision;with upper extremity assist PT Transfer Goal: Sit to Stand/Stand to Sit - Progress: Progressing toward goal Pt will Ambulate: 51 - 150 feet;with supervision PT Goal: Ambulate - Progress: Progressing toward goal Pt will Go Up / Down Stairs: 1-2 stairs;with supervision PT Goal: Up/Down Stairs - Progress: Progressing toward goal  PT Evaluation Precautions/Restrictions  Precautions Precautions: Fall Restrictions Weight Bearing Restrictions: No Prior Functioning  Home Living Lives With: Alone Type of Home: House Home Layout: One level Home Access: Other (comment) (Pt has 1 step to enter home, but 8 at church.) Entrance Stairs-Rails: None Entrance Stairs-Number of Steps: 1 Bathroom Shower/Tub: Tub/shower unit;Other (comment);Curtain Bathroom Accessibility: Yes How Accessible: Accessible via walker Home Adaptive Equipment: Bedside commode/3-in-1;Hand-held shower hose;Shower chair with back Additional Comments: has 3:1 but doesn't like it Prior Function Level of Independence: Independent with basic ADLs Driving: Yes Cognition Cognition Arousal/Alertness: Awake/alert Overall Cognitive Status: Appears within functional limits for tasks assessed Sensation/Coordination   Extremity Assessment RUE Assessment RUE Assessment: Exceptions to [facility] RUE AROM (degrees) Right Shoulder Flexion  0-170:  (limited to 90 secondary to neck pain) LUE Assessment LUE Assessment: Exceptions to WFL LUE AROM (degrees) Overall AROM Left Upper Extremity:  (shoulder flexion limited to 90 secondary to neck pain) Left Shoulder Flexion  0-170:  (limited to 90 secondary  to pain) RLE  Assessment RLE Assessment: Exceptions to [facility] RLE Strength RLE Overall Strength Comments: 4-/5 LLE Assessment LLE Assessment: Exceptions to [facility] LLE Strength LLE Overall Strength Comments: 4-/5 Mobility (including Balance) Bed Mobility Bed Mobility: Yes Supine to Sit: 3: Mod assist Sitting - Scoot to Edge of Bed: 4: Min assist;Other (comment) (extra time) Transfers Sit to Stand: 4: Min assist Ambulation/Gait Ambulation/Gait: Yes Ambulation/Gait Assistance: 4: Min assist Ambulation Distance (Feet): 45 Feet Assistive device: Rolling walker Gait velocity: slow cadence, guarded movement    Exercise    End of Session PT - End of Session Equipment Utilized During Treatment: Gait belt Activity Tolerance: Patient tolerated treatment well Patient left: in bed Nurse Communication: Other (comment) (pain level) General Behavior During Session: [facility] for tasks performed Cognition: [facility] for tasks performed  [facility] LUB[facility]K 09/26/2011, 12:46 PM

## 2011-09-26 NOTE — Progress Notes (Signed)
Occupational Therapy Evaluation Patient Details Name: Andrea [name] MRN: [id number] DOB: January 05, 1935 Today's Date: 09/26/2011  Problem List:  Patient Active Problem List  Diagnoses  . DEPRESSION/ANXIETY  . HYPOTENSION  . Edema  . DYSPNEA  . Cough  . CHEST PAIN  . Nonspecific (abnormal) findings on radiological and other examination of body structure  . ABNORMAL LUNG XRAY  . DVT (deep venous thrombosis)  . Encounter for long-term (current) use of anticoagulants  . Falls frequently  . CVD (cerebrovascular disease)  . Hyponatremia  . Obesity, morbid  . Hypothyroidism  . Atrial fibrillation  . Delirium    Past Medical History:  Past Medical History  Diagnosis Date  . Hypotension   . Depression with anxiety   . Cough     Wert-onset 08/2009  . Deep venous thrombosis   . Cervicalgia   . Hypothyroidism   . Blood transfusion   . Constipation   . Shortness of breath   . Sleep apnea   . Arthritis   . Anxiety   . Dysrhythmia   . Pneumonia   . Anemia   . Depression    Past Surgical History:  Past Surgical History  Procedure Date  . Replacement total knee bilateral   . Appendectomy   . Breast excisional biopsy     breast bxs on left x 2, breast bx of right x 1-all benign  . Laparoscopic ovarian cystectomy     OT Assessment/Plan/Recommendation OT Assessment Clinical Impression Statement: pt would benefit from skilled ot in acute secondary to problems listed below.  Goals are supervision in acute OT Recommendation/Assessment: Patient will need skilled OT in the acute care venue OT Problem List: Decreased strength;Decreased range of motion;Decreased activity tolerance;Impaired balance (sitting and/or standing);Decreased knowledge of use of DME or AE;Pain Barriers to Discharge: Other (comment) (pt states son will move in, but he works days. ) Barriers to Discharge Comments: may need 24/7.  Pt states she feels left foot drags at times.  she has periods where she falls OT  Therapy Diagnosis : Generalized weakness OT Plan OT Frequency: Min 2X/week OT Treatment/Interventions: Self-care/ADL training;Therapeutic exercise;DME and/or AE instruction;Therapeutic activities;Patient/family education;Balance training OT Recommendation Follow Up Recommendations: Skilled nursing facility;Other (comment) (depending on progress and whether pt has initial 24/7 assist) Equipment Recommended: None recommended by PT OT Goals Acute Rehab OT Goals OT Goal Formulation: With patient Time For Goal Achievement: 2 weeks ADL Goals Pt Will Perform Lower Body Bathing: with supervision;Sit to stand from chair;with adaptive equipment ADL Goal: Lower Body Bathing - Progress: Progressing toward goals Pt Will Perform Lower Body Dressing: with supervision;Sit to stand from chair;with adaptive equipment ADL Goal: Lower Body Dressing - Progress: Progressing toward goals Pt Will Transfer to Toilet: with supervision;3-in-1;Ambulation ADL Goal: Toilet Transfer - Progress: Progressing toward goals Pt Will Perform Toileting - Hygiene: with supervision;Sitting on 3-in-1 or toilet ADL Goal: Toileting - Hygiene - Progress: Progressing toward goals Arm Goals Pt Will Perform AROM: Independently;Other (comment) (with HEP in pain free range) Arm Goal: AROM - Progress: Progressing toward goal  OT Evaluation Precautions/Restrictions  Precautions Precautions: Fall Restrictions Weight Bearing Restrictions: No Prior Functioning Home Living Lives With: Alone Type of Home: House Home Layout: One level Home Access: Other (comment) (Pt has 1 step to enter home, but 8 at church.) Entrance Stairs-Rails: None Entrance Stairs-Number of Steps: 1 Bathroom Shower/Tub: Tub/shower unit;Other (comment);Curtain Bathroom Accessibility: Yes How Accessible: Accessible via walker Home Adaptive Equipment: Bedside commode/3-in-1;Hand-held shower hose;Shower chair with back Additional Comments: has 3:1  but doesn't  like it Prior Function Level of Independence: Independent with basic ADLs Driving: Yes ADL ADL Grooming: Teeth care;Minimal assistance (min guard for safety) Where Assessed - Grooming: Standing at sink Upper Body Bathing: Simulated;Minimal assistance Where Assessed - Upper Body Bathing: Unsupported;Sitting, bed;Other (comment) (limited reaching under arms secondary to pain/spasm) Lower Body Bathing: Simulated;Minimal assistance Where Assessed - Lower Body Bathing: Sit to stand from bed Upper Body Dressing: Performed;Minimal assistance Where Assessed - Upper Body Dressing: Sitting, bed;Unsupported Lower Body Dressing: Simulated;Moderate assistance Where Assessed - Lower Body Dressing: Sit to stand from bed Toilet Transfer: Simulated;Minimal assistance Toilet Transfer Method: Ambulating Toilet Transfer Equipment: Other (comment) (ambulated to sink and back to bed) Toileting - Clothing Manipulation: Minimal assistance (min guard) Where Assessed - Toileting Clothing Manipulation: Sit to stand from 3-in-1 or toilet (bed) Toileting - Hygiene: Minimal assistance;Other (comment) (min guard) Where Assessed - Toileting Hygiene: Sit to stand from 3-in-1 or toilet Equipment Used: Rolling walker ADL Comments: has difficulty with socks; did not introduce sock aid secondary to pain Vision/Perception  Vision - History Baseline Vision: No visual deficits Patient Visual Report: No change from baseline Cognition Cognition Arousal/Alertness: Awake/alert Overall Cognitive Status: Appears within functional limits for tasks assessed Sensation/Coordination   Extremity Assessment RUE Assessment RUE Assessment: Exceptions to [facility] RUE AROM (degrees) Right Shoulder Flexion  0-170:  (limited to 90 secondary to neck pain) LUE Assessment LUE Assessment: Exceptions to WFL LUE AROM (degrees) Overall AROM Left Upper Extremity:  (shoulder flexion limited to 90 secondary to neck pain) Left Shoulder Flexion   0-170:  (limited to 90 secondary to pain) Mobility  Bed Mobility Bed Mobility: Yes Supine to Sit: 3: Mod assist Sitting - Scoot to Edge of Bed: 4: Min assist;Other (comment) (extra time) Transfers Transfers: Yes Sit to Stand: 4: Min assist Exercises   End of Session OT - End of Session Equipment Utilized During Treatment: Gait belt;Other (comment) (co eval with PT) Activity Tolerance: Patient limited by pain Patient left: in bed;Other (comment) (daughter present; called [address] about resetting alarm, prn ) General Behavior During Session: [facility] for tasks performed Cognition: [facility] for tasks performed   [facility] 09/26/2011, 12:44 PM

## 2011-09-27 ENCOUNTER — Encounter (HOSPITAL_COMMUNITY): Payer: Self-pay | Admitting: Internal Medicine

## 2011-09-27 DIAGNOSIS — R51 Headache: Secondary | ICD-10-CM

## 2011-09-27 DIAGNOSIS — M542 Cervicalgia: Secondary | ICD-10-CM

## 2011-09-27 DIAGNOSIS — Z8661 Personal history of infections of the central nervous system: Secondary | ICD-10-CM

## 2011-09-27 DIAGNOSIS — R519 Headache, unspecified: Secondary | ICD-10-CM | POA: Diagnosis not present

## 2011-09-27 HISTORY — DX: Headache: R51

## 2011-09-27 LAB — CBC
HCT: 38.9 % (ref 36.0–46.0)
Hemoglobin: 12.9 g/dL (ref 12.0–15.0)
MCH: 29.5 pg (ref 26.0–34.0)
MCHC: 33.2 g/dL (ref 30.0–36.0)

## 2011-09-27 LAB — COMPREHENSIVE METABOLIC PANEL
ALT: 13 U/L (ref 0–35)
AST: 19 U/L (ref 0–37)
CO2: 28 mEq/L (ref 19–32)
Calcium: 9.5 mg/dL (ref 8.4–10.5)
Chloride: 97 mEq/L (ref 96–112)
GFR calc non Af Amer: 82 mL/min — ABNORMAL LOW (ref >90)
Sodium: 133 mEq/L — ABNORMAL LOW (ref 135–145)

## 2011-09-27 MED ORDER — MORPHINE SULFATE 4 MG/ML IJ SOLN
INTRAMUSCULAR | Status: AC
  Administered 2011-09-27: 4 mg via INTRAVENOUS
  Filled 2011-09-27: qty 1

## 2011-09-27 MED ORDER — DIAZEPAM 5 MG PO TABS: 5.0000 mg | ORAL_TABLET | Freq: Four times a day (QID) | ORAL | Status: DC | PRN

## 2011-09-27 MED ORDER — KETOROLAC TROMETHAMINE 30 MG/ML IJ SOLN
30.0000 mg | Freq: Four times a day (QID) | INTRAMUSCULAR | Status: DC | PRN
  Administered 2011-09-27: 30 mg via INTRAVENOUS
  Filled 2011-09-27: qty 1

## 2011-09-27 MED ORDER — OXYCODONE HCL 5 MG PO TABS
10.0000 mg | ORAL_TABLET | ORAL | Status: DC | PRN
  Administered 2011-09-28: 10 mg via ORAL
  Filled 2011-09-27: qty 2

## 2011-09-27 MED ORDER — MORPHINE SULFATE 4 MG/ML IJ SOLN
4.0000 mg | INTRAMUSCULAR | Status: DC | PRN
  Administered 2011-09-27 – 2011-09-28 (×4): 4 mg via INTRAVENOUS
  Filled 2011-09-27 (×3): qty 1

## 2011-09-27 NOTE — Consult Note (Addendum)
Reason for Consult: Neck pain hallucinations and history of prior encephalitis Referring Physician: Dr. Dory Larsen Smitherman is an 76 y.o. 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 She moved to [place] approximately 2 years ago. Prior to moving she was experiencing falls from time to time though the frequency of her falls has escalated dramatically over the last 2 years and in particular the last several months. She was admitted to a long hospital on the fifth again due to frequent falls and hip pain. While in the hospital she began complaining of severe neck pain which he rates as 11 out of 10 in severity. Pain also radiates up into her back of her scalp in the front of her forehead. She states that she feels warm and also suffering from some numbness in her feet. She is also began suffering from visual hallucinations saying that [place] in the case of flowers. She was also at the sternum this morning that she upon awakening wanted to visit her mother has been long since deceased. She voiced a concern to the primary team's physician Dr. Darnelle Catalan that she might be having an episode of encephalitis again. She has had imaging done here was a long including an MRI of the brain which though motion degraded did not show any evidence of temporal lobe abnormalities or other evidence of encephalitis or meningitis an MRI imaging. Similarly MRI of the cervical spine failed to show any evidence of infections as an epidural abscess or discitis. In talking to the patient she tells me that she's had these hallucinations for at least 6 months. In talking to the patient's daughter she states the patient's hallucinations began while the patient was here in the hospital and the daughter was not aware of the patient having these hallucinations. With regards to the frequent falls according to patient she is not losing consciousness but simply  falling. She's been worked up by [employer] for this and they apparently have not found a reason for her frequent falls. She also been seen by cardiology by Dr. Ladona Ridgel because she had bradycardia Dr. Ladona Ridgel feels there is no evidence of a cardiovascular cause of her falls.  Past Medical History  Diagnosis Date  . Hypotension   . Depression with anxiety   . Cough     Wert-onset 08/2009  . Deep venous thrombosis   . Cervicalgia   . Hypothyroidism   . Blood transfusion   . Constipation   . Shortness of breath   . Sleep apnea   . Arthritis   . Anxiety   . Dysrhythmia   . Pneumonia   . Anemia   . Depression   . Lumbar radiculopathy 09/26/2011  . Headache 09/27/2011    Past Surgical History  Procedure Date  . Replacement total knee bilateral   . Appendectomy   . Breast excisional biopsy     breast bxs on left x 2, breast bx of right x 1-all benign  . Laparoscopic ovarian cystectomy     Family History  Problem Relation Age of Onset  . Heart disease Father   . Malignant hyperthermia Father   . Deep vein thrombosis Son     Social History:  reports that she quit smoking about 42 years ago. Her smoking use included Cigarettes. She smoked .5 packs per day. She has never used smokeless tobacco. She reports that she drinks about .6 ounces of alcohol per  week. She reports that she does not use illicit drugs.  Allergies:  Allergies  Allergen Reactions  . Clindamycin Hcl Other (See Comments)    REACTION: swelling    Medications: I have reviewed the patient's current medications.  Anti-infectives    None      Mr Cervical Spine Wo Contrast  09/27/2011  *RADIOLOGY REPORT*  Clinical Data: Neck pain.  Frequent falls.  MRI CERVICAL SPINE WITHOUT CONTRAST  Technique:  Multiplanar and multiecho pulse sequences of the cervical spine, to include the craniocervical junction and cervicothoracic junction, were obtained according to standard protocol without intravenous  contrast.  Comparison: Radiographs dated 07/14/2011  Findings: Scan extends from the top of the clivus through T3-4. The visualized intracranial contents are normal.  Cervical spinal cord is normal with no mass lesion, myelopathy, or significant cervical spinal stenosis.  The C2-3:  Normal.  C3-4:  Mild bilateral facet arthritis with slight uncinate spurring bilaterally.  C4-5:  2 mm spondylolisthesis due to left facet arthritis.  No foraminal stenosis.  Disc bulging to the left neural foramen.  C5-6:  Disc space narrowing with uncinate spurring causing bilateral foraminal narrowing.  C6-7:  Disc space narrowing with minimal uncinate spurring without impingement.  C7-T1 through T3-4:  No significant abnormality.  IMPRESSION:  1.  No acute fractures or subluxation. 2.  Degenerative disc and joint disease at several levels, most prominent at C5-6 with bilateral foraminal narrowing and C4-5 on the left.  Original Report Authenticated By: Gwynn Burly, M.D.    Results for orders placed during the hospital encounter of 09/25/11 (from the past 48 hour(s))  CBC     Status: Normal   Collection Time   09/25/11  8:31 PM      Component Value Range Comment   WBC 8.4  4.0 - 10.5 (K/uL)    RBC 4.29  3.87 - 5.11 (MIL/uL)    Hemoglobin 12.7  12.0 - 15.0 (g/dL)    HCT 95.2  84.1 - 32.4 (%)    MCV 88.6  78.0 - 100.0 (fL)    MCH 29.6  26.0 - 34.0 (pg)    MCHC 33.4  30.0 - 36.0 (g/dL)    RDW 40.1  02.7 - 25.3 (%)    Platelets 240  150 - 400 (K/uL)   CREATININE, SERUM     Status: Abnormal   Collection Time   09/25/11  8:31 PM      Component Value Range Comment   Creatinine, Ser 0.78  0.50 - 1.10 (mg/dL)    GFR calc non Af Amer 80 (*) >90 (mL/min)    GFR calc Af Amer >90  >90 (mL/min)   CARDIAC PANEL(CRET KIN+CKTOT+MB+TROPI)     Status: Abnormal   Collection Time   09/25/11  8:31 PM      Component Value Range Comment   Total CK 618 (*) 7 - 177 (U/L)    CK, MB 4.4 (*) 0.3 - 4.0 (ng/mL)    Troponin I <0.30   <0.30 (ng/mL)    Relative Index 0.7  0.0 - 2.5    PRO B NATRIURETIC PEPTIDE     Status: Abnormal   Collection Time   09/25/11  8:31 PM      Component Value Range Comment   BNP, POC 277.0 (*) 0 - 125 (pg/mL)   CBC     Status: Normal   Collection Time   09/26/11  5:00 AM      Component Value Range Comment   WBC 6.7  4.0 - 10.5 (K/uL)    RBC 4.28  3.87 - 5.11 (MIL/uL)    Hemoglobin 12.5  12.0 - 15.0 (g/dL)    HCT 78.2  95.6 - 21.3 (%)    MCV 87.4  78.0 - 100.0 (fL)    MCH 29.2  26.0 - 34.0 (pg)    MCHC 33.4  30.0 - 36.0 (g/dL)    RDW 08.6  57.8 - 46.9 (%)    Platelets 224  150 - 400 (K/uL)   BASIC METABOLIC PANEL     Status: Abnormal   Collection Time   09/26/11  5:00 AM      Component Value Range Comment   Sodium 137  135 - 145 (mEq/L)    Potassium 3.7  3.5 - 5.1 (mEq/L)    Chloride 101  96 - 112 (mEq/L)    CO2 26  19 - 32 (mEq/L)    Glucose, Bld 111 (*) 70 - 99 (mg/dL)    BUN 14  6 - 23 (mg/dL)    Creatinine, Ser 6.29  0.50 - 1.10 (mg/dL)    Calcium 9.2  8.4 - 10.5 (mg/dL)    GFR calc non Af Amer 81 (*) >90 (mL/min)    GFR calc Af Amer >90  >90 (mL/min)   CARDIAC PANEL(CRET KIN+CKTOT+MB+TROPI)     Status: Abnormal   Collection Time   09/26/11  5:00 AM      Component Value Range Comment   Total CK 483 (*) 7 - 177 (U/L)    CK, MB 3.1  0.3 - 4.0 (ng/mL)    Troponin I <0.30  <0.30 (ng/mL)    Relative Index 0.6  0.0 - 2.5    LIPID PANEL     Status: Abnormal   Collection Time   09/26/11  5:00 AM      Component Value Range Comment   Cholesterol 182  0 - 200 (mg/dL)    Triglycerides 69  <528 (mg/dL)    HDL 56  >41 (mg/dL)    Total CHOL/HDL Ratio 3.3      VLDL 14  0 - 40 (mg/dL)    LDL Cholesterol 324 (*) 0 - 99 (mg/dL)   CARDIAC PANEL(CRET KIN+CKTOT+MB+TROPI)     Status: Abnormal   Collection Time   09/26/11  1:10 PM      Component Value Range Comment   Total CK 399 (*) 7 - 177 (U/L)    CK, MB 3.1  0.3 - 4.0 (ng/mL)    Troponin I <0.30  <0.30 (ng/mL)    Relative Index 0.8   0.0 - 2.5    COMPREHENSIVE METABOLIC PANEL     Status: Abnormal   Collection Time   09/27/11  5:15 AM      Component Value Range Comment   Sodium 133 (*) 135 - 145 (mEq/L)    Potassium 3.7  3.5 - 5.1 (mEq/L)    Chloride 97  96 - 112 (mEq/L)    CO2 28  19 - 32 (mEq/L)    Glucose, Bld 145 (*) 70 - 99 (mg/dL)    BUN 14  6 - 23 (mg/dL)    Creatinine, Ser 4.01  0.50 - 1.10 (mg/dL)    Calcium 9.5  8.4 - 10.5 (mg/dL)    Total Protein 6.5  6.0 - 8.3 (g/dL)    Albumin 3.1 (*) 3.5 - 5.2 (g/dL)    AST 19  0 - 37 (U/L)    ALT 13  0 - 35 (U/L)  Alkaline Phosphatase 91  39 - 117 (U/L)    Total Bilirubin 0.4  0.3 - 1.2 (mg/dL)    GFR calc non Af Amer 82 (*) >90 (mL/min)    GFR calc Af Amer >90  >90 (mL/min)   CBC     Status: Normal   Collection Time   09/27/11  5:15 AM      Component Value Range Comment   WBC 8.7  4.0 - 10.5 (K/uL)    RBC 4.37  3.87 - 5.11 (MIL/uL)    Hemoglobin 12.9  12.0 - 15.0 (g/dL)    HCT 16.1  09.6 - 04.5 (%)    MCV 89.0  78.0 - 100.0 (fL)    MCH 29.5  26.0 - 34.0 (pg)    MCHC 33.2  30.0 - 36.0 (g/dL)    RDW 40.9  81.1 - 91.4 (%)    Platelets 229  150 - 400 (K/uL)        Recent Results (from the past 720 hour(s))  URINE CULTURE     Status: Normal   Collection Time   09/25/11  6:14 AM      Component Value Range Status Comment   Specimen Description URINE, CATHETERIZED   Final    Special Requests NONE   Final    Setup Time [id number]   Final    Colony Count NO GROWTH   Final    Culture NO GROWTH   Final    Report Status 09/26/2011 FINAL   Final     ROS as in HPI otherwise negative on 12 point review of symptoms   Blood pressure 124/76, pulse 76, temperature 98.4 F (36.9 C), temperature source Oral, resp. rate 19, height 5\' 5"  (1.651 m), weight 220 lb (99.791 kg), SpO2 94.00%.  PHYSICAL EXAM:  General: Alert oriented to person place month year. She is slightly dysphoric. HEENT: Number cephalic and atraumatic. She does have tenderness in her neck  posteriorly and pain with flexion and dorsiflexion of her neck. There are no palpable deformities in the cervical spine or the skull. Oropharynx is clear without exudate or lesions. Cardiovascular: Regular rate and rhythm no murmurs caps or rubs heard  Lungs were clear to auscultation bilaterally without wheezes rhonchi or rales.  Abdomen soft nondistended nontender with positive bowel sounds and no evidence organomegaly.  Musculoskeletal patient had surgical scars on both knees due to her total knee arthroplasties. There were no effusions and no tenderness about her knees no other joint deformities or other maladies on examination of knees and hips arms wrists. Skin: No rashes or other significant lesions.  Neurological exam: The patient is dysphoric she does appear oriented to person and place and date she knew the presidential election to yesterday. She did endorse difficult with short-term and long-term memory. Her cranial nerves appeared grossly intact. Her strength in the upper extremities is 5 out of 5 throughout and 5 out of 5 in her lower 70s. Babinski's were downgoing. Gait was not assessed.   Assessment/Plan: 76 year old Caucasian lady with prior history of encephalitis 10 years ago in [place] who has suffered from problems with memory likely some form of dementia also frequent falls over several year period who was admitted after multiple falls and now is complaining of severe neck pain headaches along with visual hallucinations. She was concerned about recurrence of encephalitis. She has had subjective feelings of warmth and had a maximum temperature since admission of 99.4. Her acidosis and workup this patient for her hallucinations  neck pain and subjective feelings of warmth.  Hallucinations with concern for Encephalitis and or Meningitis: Daughter believes these hallucinations are relatively new phenomenon (although pt states she has had tehm for 6 months). I wonder if they are related  to delirium that has been superimposed on dementia the setting of a hospital admit admission and pain. I find very little to suggest a bacterial or viral meningitis or encephalitis in this patient. With a bacterial meningitis the patient should appear more sick. Certainly it could be possible the patient could have sustained a skull fracture and have a spinal fluid leak that could be now declaring itself in the form of meningitis but I don't think this is terribly likely. Viral meningitis is again also possible but with the absence of feve.r I also find it to be unlikely. Encephalitis also seems unlikely. Typical culprits for encephalitis such as herpes simplex type I,  have been essentially excluded with the MRI that is normal without evidence specifically of temporal lobe involvement. Similarly herpes zoster infection of the central nervous system seems very unlikely given the rather unremarkable MRI of the brain and given her clinical presentation. More rare and unusual causes of encephalitis or meningitis such as human herpes virus 6 and 7 with similarly seem unlikely. Certainly infections such as arthropod borne infection such as [place] Nile virus or western equine encephalitis or [place] Encephalitis or [place] encephalitis virus would seem very unlikely. Enterovirus encephalitis would also seem unlikely. Syphilis does not seem to be likely either although I think checking an RPR would be prudent. HIV infection is not likely currently given her demographics but I think an HIV antibody is warranted. At this time I do not feel that a lumbar puncture is warranted. Rather I would continue to observe her off antibiotics and off antiviral therapy. Should she have a fever then I would certainly get a lumbar puncture with documented opening pressure, spinal fluid protein and glucose Gram stain and culture. I would also obtain as much spinal fluid as possible so that further studies to look for less likely culprit  such as fungal or tuberculous meningitis (both would requie a minimum of 8-10 ml  of CSF each  to be centrifuged before sending sediment for fungal or afb culture)  could be explored and so that further serologies and PCR could be sent. Obtaining records from [place] hospital might also be helpful.   Neck pain: Much of this seems to be musculoskeletal in nature and was aggravated by me pushing on the patient's neck muscles. I would treat this symptomatically with pain medications and muscle relaxants. Again a suspicion for meningitis and encephalitis are both very low.  Headaches: As above I think that meningitis encephalitis or unlikely.  Confusion: I suspect the patient has underlying dementia and I feel she should have a workup for dementia certainly checking her thyroid hormone along with RPR T12 levels and HIV tests are all prudent.by my exam I don't find much to suggest parkinsonism but I would defer to their expertise of neurology in terms of looking for other causes of movement disorders and dementia.  Hallucinations: again wonder about dementia with superimposed delirium, "sundowning" and pain as precipitants. Acey Lav 09/27/2011, 3:26 PM      I spent a total of 60 minutes with this patient including greater than counselling, the patient, the patients daugther and then the patient son

## 2011-09-27 NOTE — Progress Notes (Addendum)
Subjective: Andrea [name] is complaining of excruciating head and neck pain. She has not had any significant relief from Valium or Toradol. She has also been given 5 mg of oxycodone without any significant benefit. The patient states that she has been treated for encephalitis in the past and that her pain is similar to when she was treated for this. She also continues to report some visual disturbances. She is seeing cobwebs and feathers at times. Objective:  Vital signs in last 24 hours:  Filed Vitals:   09/26/11 1500 09/26/11 2131 09/27/11 0525 09/27/11 0917  BP: 129/55 122/56 124/76   Pulse: 73 80 76   Temp: 98.9 F (37.2 C) 98.4 F (36.9 C) 98.6 F (37 C) 98.4 F (36.9 C)  TempSrc: Oral Oral Oral Oral  Resp: 18 20 19    Height:      Weight:      SpO2: 99% 96% 94%     Intake/Output from previous day:   Intake/Output Summary (Last 24 hours) at 09/27/11 1127 Last data filed at 09/27/11 0943  Gross per 24 hour  Intake    723 ml  Output   2475 ml  Net  -1752 ml    Physical Exam: General Appearance:    Alert, cooperative, moderate distress with anxiety  Lungs:     Clear to auscultation bilaterally, respirations unlabored   Heart:    Regular rate and rhythm, S1 and S2 normal, no murmur, rub   or gallop  Abdomen:     Soft, non-tender, bowel sounds active all four quadrants,    no masses, no organomegaly  Extremities:   Extremities normal, atraumatic, no cyanosis, 1+edema  Pulses:   2+ and symmetric all extremities    Lab Results:  Basic Metabolic Panel:    Component Value Date/Time   NA 133* 09/27/2011 0515   K 3.7 09/27/2011 0515   CL 97 09/27/2011 0515   CO2 28 09/27/2011 0515   BUN 14 09/27/2011 0515   CREATININE 0.73 09/27/2011 0515   GLUCOSE 145* 09/27/2011 0515   CALCIUM 9.5 09/27/2011 0515   CBC:    Component Value Date/Time   WBC 8.7 09/27/2011 0515   HGB 12.9 09/27/2011 0515   HCT 38.9 09/27/2011 0515   PLT 229 09/27/2011 0515   MCV 89.0 09/27/2011 0515   NEUTROABS 11.0* 09/25/2011 0510   LYMPHSABS 0.8 09/25/2011 0510   MONOABS 0.6 09/25/2011 0510   EOSABS 0.0 09/25/2011 0510   BASOSABS 0.0 09/25/2011 0510    Recent Results (from the past 240 hour(s))  URINE CULTURE     Status: Normal   Collection Time   09/25/11  6:14 AM      Component Value Range Status Comment   Specimen Description URINE, CATHETERIZED   Final    Special Requests NONE   Final    Setup Time [id number]   Final    Colony Count NO GROWTH   Final    Culture NO GROWTH   Final    Report Status 09/26/2011 FINAL   Final     Studies/Results: Mr Laqueta Jean NG Contrast  09/25/2011  *RADIOLOGY REPORT*  Clinical Data: Altered mental status.  Fell on driveway.  Confused.  MRI HEAD WITHOUT AND WITH CONTRAST  Technique:  Multiplanar, multiecho pulse sequences of the brain and surrounding structures were obtained according to standard protocol without and with intravenous contrast  Contrast: 20mL MULTIHANCE GADOBENATE DIMEGLUMINE 529 MG/ML IV SOLN  Comparison: CT head earlier in the day.  MRI head 08/30/2010.  Findings: The patient was moderately uncooperative and could not remain motionless for the study.  Images are suboptimal.  Small or subtle lesions could be overlooked.  There is no evidence for acute infarction, intracranial hemorrhage, mass lesion, hydrocephalus, or extra-axial fluid. Moderate to severe atrophy is present.  Advanced chronic microvascular ischemic change is present in the periventricular and subcortical white matter.  No midline shift.  Patent cerebral vasculature.  Bilateral cataract extraction.  No acute sinus or mastoid disease.  The appearance is unchanged from prior CT. No change from 2011 MRI.  IMPRESSION: Atrophy and small vessel disease.  No acute intracranial findings.  Original Report Authenticated By: Elsie Stain, M.D.   Mr Cervical Spine Wo Contrast  09/27/2011  *RADIOLOGY REPORT*  Clinical Data: Neck pain.  Frequent falls.  MRI CERVICAL SPINE WITHOUT CONTRAST   Technique:  Multiplanar and multiecho pulse sequences of the cervical spine, to include the craniocervical junction and cervicothoracic junction, were obtained according to standard protocol without intravenous contrast.  Comparison: Radiographs dated 07/14/2011  Findings: Scan extends from the top of the clivus through T3-4. The visualized intracranial contents are normal.  Cervical spinal cord is normal with no mass lesion, myelopathy, or significant cervical spinal stenosis.  The C2-3:  Normal.  C3-4:  Mild bilateral facet arthritis with slight uncinate spurring bilaterally.  C4-5:  2 mm spondylolisthesis due to left facet arthritis.  No foraminal stenosis.  Disc bulging to the left neural foramen.  C5-6:  Disc space narrowing with uncinate spurring causing bilateral foraminal narrowing.  C6-7:  Disc space narrowing with minimal uncinate spurring without impingement.  C7-T1 through T3-4:  No significant abnormality.  IMPRESSION:  1.  No acute fractures or subluxation. 2.  Degenerative disc and joint disease at several levels, most prominent at C5-6 with bilateral foraminal narrowing and C4-5 on the left.  Original Report Authenticated By: Gwynn Burly, M.D.    Medications: Scheduled Meds:    . aspirin EC  81 mg Oral Daily  . buPROPion  300 mg Oral Daily  . calcium carbonate  1,250 mg Oral Daily  . diazepam  2 mg Oral Q6H  . DULoxetine  60 mg Oral Daily  . enoxaparin  40 mg Subcutaneous Q24H  . levothyroxine  50 mcg Oral Q breakfast  . multivitamins ther. w/minerals  1 tablet Oral Daily  . simvastatin  20 mg Oral q1800  . sodium chloride  3 mL Intravenous Q12H  . triamcinolone  1 application Topical BID   Continuous Infusions:    . sodium chloride 250 mL (09/25/11 2045)   PRN Meds:.acetaminophen, acetaminophen, alum & mag hydroxide-simeth, ammonium lactate, diclofenac sodium, ketorolac, ondansetron (ZOFRAN) IV, ondansetron, oxyCODONE, senna, sodium chloride,  zolpidem  Assessment/Plan:  Principal Problem:  *Falls frequently: Last saw Dr. Geraldine Contras in June.  She apparently had a MRI of the lumbar spine done 10/11.  Spoke with Dr. Geraldine Contras who stated that her falls were attributed to lumbar radiculopathy related to osteoarthritis.  There was moderate foraminal stenosis at multiple levels on MRI of the lumbar spine, but no spinal stenosis.  We have checked her TSH and B12 levels, both of which are normal.  Continue PT/OT.  The patient initially was reluctant to go to a skilled nursing facility but has now agreed to this plan of care and once he is medically stable, she will be discharged for further rehabilitation at a skilled nursing facility. Active Problems: HEADACHE/H/O ENCEPHALITIS: Patient states that her headache pain is similar to pain  she had when she was treated for encephalitis. At this point, I have requested an infectious disease consultation to help determine if this diagnosis is a possibility for her ongoing symptoms.  Will defer ordering a lumbar puncture until she's been evaluated by the infectious disease consultants.  We'll add morphine for better pain control and increase her oxycodone dose to 10 mg every 4 hours as needed.  HYPOTENSION: The patient's blood pressure has been stable.  DYSPNEA: The patient's oxygen saturations have been 95-99% on room air.  Likely has a component of OHS given her morbid obesity.  EF was 55-60% on Echo done last year.  Delirium: The patient did develop acute on the date of admission prompting [place] to order an MRI of her brain which was essentially normal.  Her mentation has cleared and she is currently back to baseline. Visual disturbance: An MRI of her cervical spine was done and did not show any acute abnormalities to explain her symptoms. She continues to report seeing things like cobwebs and feathers.  DEPRESSION/ANXIETY: Continue Cymbalta and Wellbutrin.  Edema: Thought to be secondary to her obesity.   Cough: No current active cough at this time.  CVD (cerebrovascular disease): The patient had an MRI of the brain done which did not show any acute events.  Continue aspirin and risk factor reduction. We dded a statin to address her LDL elevation.  Hyponatremia: Mildly fluctuating. The patient's sodium was normal yesterday but today it is slightly low. Obesity, morbid: Hgb A1C is 5.4.  Dietician consult completed.  Hypothyroidism: The patient's TSH is normal at 1.468.  Continue current dose of synthroid. Dyslipidemia:  The patient's LDL is 112.  Goal LDL is less than 100.  We started statin therapy. Lumbar radiculopathy: Was not a candidate for aggressive intervention or surgery per Dr. Geraldine Contras. Muscle spasm of back: Likely related to fall and resultant musculoskeletal injury.  Increase Valium to 5 mg as needed..    LOS: 2 days   RAMA,CHRISTINA 09/27/2011, 11:27 AM

## 2011-09-28 LAB — HIV ANTIBODY (ROUTINE TESTING W REFLEX): HIV: NONREACTIVE

## 2011-09-28 MED ORDER — OXYCODONE HCL 10 MG PO TABS: 10.0000 mg | ORAL_TABLET | ORAL | Status: AC | PRN

## 2011-09-28 MED ORDER — GADOBENATE DIMEGLUMINE 529 MG/ML IV SOLN
20.0000 mL | Freq: Once | INTRAVENOUS | Status: AC | PRN
  Administered 2011-09-28: 20 mL via INTRAVENOUS

## 2011-09-28 MED ORDER — DIAZEPAM 5 MG PO TABS: 5.0000 mg | ORAL_TABLET | Freq: Four times a day (QID) | ORAL | Status: AC | PRN

## 2011-09-28 MED ORDER — ZOLPIDEM TARTRATE 5 MG PO TABS: 5.0000 mg | ORAL_TABLET | Freq: Every evening | ORAL | Status: DC | PRN

## 2011-09-28 MED ORDER — SIMVASTATIN 20 MG PO TABS: 20.0000 mg | ORAL_TABLET | Freq: Every day | ORAL | Status: DC

## 2011-09-28 MED ORDER — ACETAMINOPHEN 325 MG PO TABS: 650.0000 mg | ORAL_TABLET | Freq: Four times a day (QID) | ORAL | Status: AC | PRN

## 2011-09-28 MED ORDER — ASPIRIN 81 MG PO TBEC: 81.0000 mg | DELAYED_RELEASE_TABLET | Freq: Every day | ORAL | Status: AC

## 2011-09-28 NOTE — Discharge Summary (Signed)
Physician Discharge Summary  Patient ID: Andrea [name] MRN: [id number] DOB/AGE: [birth year] 76 y.o.  Admit date: 09/25/2011 Discharge date: 09/28/2011  Primary Care Physician:  Karie Chimera, MD   Discharge Diagnoses:    Present on Admission:  .DEPRESSION/ANXIETY .HYPOTENSION .Edema .DYSPNEA .Cough .CVD (cerebrovascular disease) .Hyponatremia .Obesity, morbid .Hypothyroidism .Atrial fibrillation .Dyslipidemia  Discharge Medications:  Current Discharge Medication List    START taking these medications   Details  acetaminophen (TYLENOL) 325 MG tablet Take 2 tablets (650 mg total) by mouth every 6 (six) hours as needed (or Fever >/= 101).    aspirin EC 81 MG EC tablet Take 1 tablet (81 mg total) by mouth daily.    diazepam (VALIUM) 5 MG tablet Take 1 tablet (5 mg total) by mouth every 6 (six) hours as needed (Muscle spasms or anxiety). Qty: 30 tablet, Refills: 0    oxyCODONE 10 MG TABS Take 1 tablet (10 mg total) by mouth every 4 (four) hours as needed for pain. Qty: 30 tablet, Refills: 0    simvastatin (ZOCOR) 20 MG tablet Take 1 tablet (20 mg total) by mouth daily at 6 PM. Qty: 30 tablet, Refills: 0    zolpidem (AMBIEN) 5 MG tablet Take 1 tablet (5 mg total) by mouth at bedtime as needed for sleep. Qty: 30 tablet, Refills: 0      CONTINUE these medications which have NOT CHANGED   Details  ammonium lactate (LAC-HYDRIN) 12 % lotion Apply 1 application topically daily as needed. FOR DRY SKIN     buPROPion (WELLBUTRIN XL) 300 MG 24 hr tablet Take 300 mg by mouth daily.     Calcium 1500 MG tablet Take 1,500 mg by mouth.      DULoxetine (CYMBALTA) 60 MG capsule Take 60 mg by mouth daily.     levothyroxine (SYNTHROID, LEVOTHROID) 50 MCG tablet Take 50 mcg by mouth daily.     multivitamin (THERAGRAN) per tablet Take 1 tablet by mouth daily.     temazepam (RESTORIL) 15 MG capsule Take 15 mg by mouth at bedtime as needed.     triamcinolone (KENALOG) 0.1 % cream  Apply 1 application topically 2 (two) times daily. MIXED IN CETAPHIL 1:1 AND THE DIRECTIONS ARE APPLY TO AFFECTED AREAS TWICE A DAY.     diclofenac sodium (VOLTAREN) 1 % GEL Apply topically as needed.       STOP taking these medications     amoxicillin-clavulanate (AUGMENTIN XR) 1000-62.5 MG per tablet      ciprofloxacin (CIPRO) 250 MG tablet      warfarin (COUMADIN) 5 MG tablet          Disposition and Follow-up: The patient is being sent to a SNF for rehab.  She was instructed to follow-up with her PCP in 1-2 weeks.  Consults:  Dr. Synthia Innocent of Infectious Diseases.                      Dr. Geraldine Contras of Neurology (via telephone only)   Significant Diagnostic Studies:  Dg Hip Complete Left  09/25/2011  *RADIOLOGY REPORT*  Clinical Data: Left hip pain status post fall.  LEFT HIP - COMPLETE 2+ VIEW  Comparison: None.  Findings: No displaced acute fracture or dislocation identified. No aggressive appearing osseous lesion.  The sacrum is partially obscured by overlying bowel gas artifact.  Degenerative changes of the lower lumbar spine.  IMPRESSION: No displaced fracture identified.  If clinical concern for a nondisplaced fracture persists, recommend MRI.  Original Report Authenticated  By: Waneta Martins, M.D.   Ct Head Wo Contrast  09/25/2011  *RADIOLOGY REPORT*  Clinical Data: Status post fall  CT HEAD WITHOUT CONTRAST  Technique:  Contiguous axial images were obtained from the base of the skull through the vertex without contrast.  Comparison: 07/20/2010  Findings: Prominence of the sulci, cisterns, and ventricles, in keeping with volume loss. There are subcortical and periventricular white matter hypodensities, a nonspecific finding most often seen with chronic microangiopathic changes.  There is no evidence for acute hemorrhage, overt hydrocephalus, mass lesion, or abnormal extra-axial fluid collection.  No definite CT evidence for acute cortical based (large artery) infarction. The  visualized paranasal sinuses and mastoid air cells are predominately clear. No displaced calvarial fracture.  IMPRESSION: White matter hypodensities are most in keeping with chronic microangiopathic change.  No definite acute intracranial abnormality.  Original Report Authenticated By: Waneta Martins, M.D.   Mr Laqueta Jean Wo Contrast  09/25/2011  *RADIOLOGY REPORT*  Clinical Data: Altered mental status.  Fell on driveway.  Confused.  MRI HEAD WITHOUT AND WITH CONTRAST  Technique:  Multiplanar, multiecho pulse sequences of the brain and surrounding structures were obtained according to standard protocol without and with intravenous contrast  Contrast: 20mL MULTIHANCE GADOBENATE DIMEGLUMINE 529 MG/ML IV SOLN  Comparison: CT head earlier in the day.  MRI head 08/30/2010.  Findings: The patient was moderately uncooperative and could not remain motionless for the study.  Images are suboptimal.  Small or subtle lesions could be overlooked.  There is no evidence for acute infarction, intracranial hemorrhage, mass lesion, hydrocephalus, or extra-axial fluid. Moderate to severe atrophy is present.  Advanced chronic microvascular ischemic change is present in the periventricular and subcortical white matter.  No midline shift.  Patent cerebral vasculature.  Bilateral cataract extraction.  No acute sinus or mastoid disease.  The appearance is unchanged from prior CT. No change from 2011 MRI.  IMPRESSION: Atrophy and small vessel disease.  No acute intracranial findings.  Original Report Authenticated By: Elsie Stain, M.D.   Mr Cervical Spine Wo Contrast  09/27/2011  *RADIOLOGY REPORT*  Clinical Data: Neck pain.  Frequent falls.  MRI CERVICAL SPINE WITHOUT CONTRAST  Technique:  Multiplanar and multiecho pulse sequences of the cervical spine, to include the craniocervical junction and cervicothoracic junction, were obtained according to standard protocol without intravenous contrast.  Comparison: Radiographs dated  07/14/2011  Findings: Scan extends from the top of the clivus through T3-4. The visualized intracranial contents are normal.  Cervical spinal cord is normal with no mass lesion, myelopathy, or significant cervical spinal stenosis.  The C2-3:  Normal.  C3-4:  Mild bilateral facet arthritis with slight uncinate spurring bilaterally.  C4-5:  2 mm spondylolisthesis due to left facet arthritis.  No foraminal stenosis.  Disc bulging to the left neural foramen.  C5-6:  Disc space narrowing with uncinate spurring causing bilateral foraminal narrowing.  C6-7:  Disc space narrowing with minimal uncinate spurring without impingement.  C7-T1 through T3-4:  No significant abnormality.  IMPRESSION:  1.  No acute fractures or subluxation. 2.  Degenerative disc and joint disease at several levels, most prominent at C5-6 with bilateral foraminal narrowing and C4-5 on the left.  Original Report Authenticated By: Gwynn Burly, M.D.    Discharge Laboratory Values: Results for orders placed during the hospital encounter of 09/25/11 (from the past 48 hour(s))  CARDIAC PANEL(CRET KIN+CKTOT+MB+TROPI)     Status: Abnormal   Collection Time   09/26/11  1:10 PM  Component Value Range Comment   Total CK 399 (*) 7 - 177 (U/L)    CK, MB 3.1  0.3 - 4.0 (ng/mL)    Troponin I <0.30  <0.30 (ng/mL)    Relative Index 0.8  0.0 - 2.5    COMPREHENSIVE METABOLIC PANEL     Status: Abnormal   Collection Time   09/27/11  5:15 AM      Component Value Range Comment   Sodium 133 (*) 135 - 145 (mEq/L)    Potassium 3.7  3.5 - 5.1 (mEq/L)    Chloride 97  96 - 112 (mEq/L)    CO2 28  19 - 32 (mEq/L)    Glucose, Bld 145 (*) 70 - 99 (mg/dL)    BUN 14  6 - 23 (mg/dL)    Creatinine, Ser 1.61  0.50 - 1.10 (mg/dL)    Calcium 9.5  8.4 - 10.5 (mg/dL)    Total Protein 6.5  6.0 - 8.3 (g/dL)    Albumin 3.1 (*) 3.5 - 5.2 (g/dL)    AST 19  0 - 37 (U/L)    ALT 13  0 - 35 (U/L)    Alkaline Phosphatase 91  39 - 117 (U/L)    Total Bilirubin 0.4   0.3 - 1.2 (mg/dL)    GFR calc non Af Amer 82 (*) >90 (mL/min)    GFR calc Af Amer >90  >90 (mL/min)   CBC     Status: Normal   Collection Time   09/27/11  5:15 AM      Component Value Range Comment   WBC 8.7  4.0 - 10.5 (K/uL)    RBC 4.37  3.87 - 5.11 (MIL/uL)    Hemoglobin 12.9  12.0 - 15.0 (g/dL)    HCT 09.6  04.5 - 40.9 (%)    MCV 89.0  78.0 - 100.0 (fL)    MCH 29.5  26.0 - 34.0 (pg)    MCHC 33.2  30.0 - 36.0 (g/dL)    RDW 81.1  91.4 - 78.2 (%)    Platelets 229  150 - 400 (K/uL)     Brief H and P: For complete details please refer to admission H and P, but in brief, Mrs. Bauza is a 76 year old female who presented to the hospital with her daughter with a chief complaint of frequent falls at home. The patient lived alone and the E.D. Physician felt that she was unsafe to be discharged home due to the frequent falling. This issue had been present for the past year, and had varied in intensity, but had gotten worse over the week prior to admission, with 5 falls reported in the past 7 days prior to admission. The patient had been taken off coumadin due to these frequent falls, and had seen a neurologist in the past to evaluate this issue. No cause was ever identified, per the patient. The patient denied any preceding dizziness, faintness, or other prodromal issues. She had some generalized weakness, chronic back pain, chronic knee pain all of which was felt to be contributory  Physical Exam at Discharge: BP 158/82  Pulse 76  Temp(Src) 99 F (37.2 C) (Oral)  Resp 18  Ht 5\' 5"  (1.651 m)  Wt 99.791 kg (220 lb)  BMI 36.61 kg/m2  SpO2 95%  Physical Exam:  General Appearance:  Alert, cooperative, decreased anxiety   Lungs:  Clear to auscultation bilaterally, respirations unlabored   Heart:  Regular rate and rhythm, S1 and S2 normal, no murmur,  rub  or gallop   Abdomen:  Soft, non-tender, bowel sounds active all four quadrants,  no masses, no organomegaly   Extremities:  Extremities  normal, atraumatic, no cyanosis, 1+edema   Pulses:  2+ and symmetric all extremities        Hospital Course:  Principal Problem:  *Falls frequently: The patient last saw her neurologist,  Dr. Geraldine Contras in June. She apparently had a MRI of the lumbar spine done 10/11. I spoke with Dr. Geraldine Contras who stated that her falls were attributed to lumbar radiculopathy related to osteoarthritis. There was moderate foraminal stenosis at multiple levels on MRI of the lumbar spine, but no spinal stenosis. We have checked her TSH and B12 levels, both of which are normal. She was seen and evaluated by  PT/OT, who recommended SNF placement. The patient initially was reluctant to go to a skilled nursing facility but has now agreed to this plan of care and the plan is to d/c her to a SNF today. Active Problems:  HEADACHE/H/O ENCEPHALITIS: Patient stated that her headache pain wass similar to pain she had when she was treated for encephalitis. She was seen and evaluated by the infectious disease team to help determine if this diagnosis was a possibility for her ongoing symptoms. No lumbar puncture was recommended, as she was afebrile and without leukocytosis. Recommend symptomatic management post-discharge. HYPOTENSION: The patient's blood pressure has been stable.  DYSPNEA: The patient's oxygen saturations have been 95-99% on room air. Likely has a component of OHS given her morbid obesity.  Recommend an out-patient sleep study if symptoms persist.   EF was 55-60% on Echo done last year.  Delirium: The patient did develop acute on the date of admission prompting [place] to order an MRI of her brain which was essentially normal. Her mentation has cleared and she is currently back to baseline. She does get anxious at times, which may be contributory. Visual disturbance: An MRI of her cervical spine was done and did not show any acute abnormalities to explain her symptoms. She continued to report seeing things like cobwebs and  feathers which prompted [place] to request an infectious disease consultation to determine if there was any clinical concern for recurrent encephalitis.  The patient's symptoms appear to be resolved on the date of discharge.  DEPRESSION/ANXIETY: Continue Cymbalta and Wellbutrin.  Edema: Thought to be secondary to her obesity.  Cough: No current active cough at this time.  CVD (cerebrovascular disease): The patient had an MRI of the brain done which did not show any acute events. Continue aspirin and risk factor reduction. We added a statin to address her LDL elevation.  Hyponatremia: Mildly fluctuating. Recommend routine follow up with a BMET in 1 week. Obesity, morbid: Hgb A1C is 5.4. Dietician consult completed.  Hypothyroidism: The patient's TSH is normal at 1.468. Continue current dose of synthroid.  Dyslipidemia: The patient's LDL is 112. Goal LDL is less than 100. We started statin therapy.  Lumbar radiculopathy: Was not a candidate for aggressive intervention or surgery per Dr. Geraldine Contras.  Muscle spasm of back: Likely related to fall and resultant musculoskeletal injury. Increase Valium to 5 mg as needed.     Recommendations for hospital follow-up: 1.  BMET in 1 week to follow-up on hyponatremia. 2.  Consider out-patient sleep study if patient continues to have problems with dyspnea.  Time spent on Discharge: 45 minutes.  Signed: Shakeya Kerkman 09/28/2011, 10:35 AM

## 2011-09-29 LAB — FOLATE RBC: RBC Folate: 739 ng/mL — ABNORMAL HIGH (ref >=366)

## 2011-11-23 DIAGNOSIS — F325 Major depressive disorder, single episode, in full remission: Secondary | ICD-10-CM | POA: Diagnosis not present

## 2011-11-30 DIAGNOSIS — I739 Peripheral vascular disease, unspecified: Secondary | ICD-10-CM | POA: Diagnosis not present

## 2011-11-30 DIAGNOSIS — M2040 Other hammer toe(s) (acquired), unspecified foot: Secondary | ICD-10-CM | POA: Diagnosis not present

## 2011-11-30 DIAGNOSIS — L608 Other nail disorders: Secondary | ICD-10-CM | POA: Diagnosis not present

## 2011-11-30 DIAGNOSIS — M79609 Pain in unspecified limb: Secondary | ICD-10-CM | POA: Diagnosis not present

## 2011-11-30 DIAGNOSIS — L84 Corns and callosities: Secondary | ICD-10-CM | POA: Diagnosis not present

## 2011-12-06 DIAGNOSIS — R51 Headache: Secondary | ICD-10-CM | POA: Diagnosis not present

## 2011-12-06 DIAGNOSIS — I10 Essential (primary) hypertension: Secondary | ICD-10-CM | POA: Diagnosis not present

## 2011-12-08 ENCOUNTER — Emergency Department (HOSPITAL_COMMUNITY)
Admission: EM | Admit: 2011-12-08 | Discharge: 2011-12-09 | Disposition: A | Discharge status: Home / Self Care | Payer: Medicare Other | Attending: Emergency Medicine | Admitting: Emergency Medicine

## 2011-12-08 ENCOUNTER — Encounter (HOSPITAL_COMMUNITY): Payer: Self-pay | Admitting: Emergency Medicine

## 2011-12-08 DIAGNOSIS — S40029A Contusion of unspecified upper arm, initial encounter: Secondary | ICD-10-CM | POA: Diagnosis not present

## 2011-12-08 DIAGNOSIS — W19XXXA Unspecified fall, initial encounter: Secondary | ICD-10-CM | POA: Insufficient documentation

## 2011-12-08 DIAGNOSIS — Z79899 Other long term (current) drug therapy: Secondary | ICD-10-CM | POA: Insufficient documentation

## 2011-12-08 DIAGNOSIS — Z7982 Long term (current) use of aspirin: Secondary | ICD-10-CM | POA: Insufficient documentation

## 2011-12-08 DIAGNOSIS — F341 Dysthymic disorder: Secondary | ICD-10-CM | POA: Diagnosis not present

## 2011-12-08 DIAGNOSIS — T07XXXA Unspecified multiple injuries, initial encounter: Secondary | ICD-10-CM

## 2011-12-08 DIAGNOSIS — T1490XA Injury, unspecified, initial encounter: Secondary | ICD-10-CM | POA: Diagnosis not present

## 2011-12-08 DIAGNOSIS — S0100XA Unspecified open wound of scalp, initial encounter: Secondary | ICD-10-CM | POA: Diagnosis not present

## 2011-12-08 DIAGNOSIS — M129 Arthropathy, unspecified: Secondary | ICD-10-CM | POA: Insufficient documentation

## 2011-12-08 DIAGNOSIS — Z86718 Personal history of other venous thrombosis and embolism: Secondary | ICD-10-CM | POA: Diagnosis not present

## 2011-12-08 DIAGNOSIS — E039 Hypothyroidism, unspecified: Secondary | ICD-10-CM | POA: Insufficient documentation

## 2011-12-08 DIAGNOSIS — R51 Headache: Secondary | ICD-10-CM | POA: Diagnosis not present

## 2011-12-08 DIAGNOSIS — S0101XA Laceration without foreign body of scalp, initial encounter: Secondary | ICD-10-CM

## 2011-12-08 NOTE — ED Provider Notes (Signed)
History     CSN: [id number]  Arrival date & time 12/08/11  2144   First MD Initiated Contact with Patient 12/08/11 2323      Chief Complaint  Patient presents with  . Fall    head laceration from mechanical fall  . Abrasion    (Consider location/radiation/quality/duration/timing/severity/associated sxs/prior treatment) HPI This is a 77 year old white female who fell about 8:45 this evening. There was no loss of consciousness and she's had no vomiting she has a small laceration to the top of her head which she state lead excessively and that is the reason she came in. The wound is subsequently stopped bleeding after pressure was held on it. She denies neck injury. She does state she fell onto her right arm but denies any significant pain or tenderness there. She has a very remote history of deep vein thrombosis and is not on any anticoagulation as with the exception of an 81 mg aspirin daily.  Past Medical History  Diagnosis Date  . Hypotension   . Depression with anxiety   . Cough     Wert-onset 08/2009  . Deep venous thrombosis   . Cervicalgia   . Hypothyroidism   . Blood transfusion   . Constipation   . Shortness of breath   . Sleep apnea   . Arthritis   . Anxiety   . Dysrhythmia   . Pneumonia   . Anemia   . Depression   . Lumbar radiculopathy 09/26/2011  . Headache 09/27/2011    Past Surgical History  Procedure Date  . Replacement total knee bilateral   . Appendectomy   . Breast excisional biopsy     breast bxs on left x 2, breast bx of right x 1-all benign  . Laparoscopic ovarian cystectomy     Family History  Problem Relation Age of Onset  . Heart disease Father   . Malignant hyperthermia Father   . Deep vein thrombosis Son     History  Substance Use Topics  . Smoking status: Former Smoker -- 0.5 packs/day    Types: Cigarettes    Quit date: 11/20/1968  . Smokeless tobacco: Never Used   Comment: smoked for approx. 10 years  . Alcohol Use: 0.6  oz/week    1 Glasses of wine per week     rare    OB History    Grav Para Term Preterm Abortions TAB SAB Ect Mult Living                  Review of Systems  All other systems reviewed and are negative.    Allergies  Clindamycin hcl  Home Medications   Current Outpatient Rx  Name Route Sig Dispense Refill  . AMMONIUM LACTATE 12 % EX LOTN Topical Apply 1 application topically daily as needed. FOR DRY SKIN     . ASPIRIN 81 MG PO TBEC Oral Take 1 tablet (81 mg total) by mouth daily.    . BUPROPION HCL ER (XL) 300 MG PO TB24 Oral Take 300 mg by mouth daily.     [name] CALCIUM 1500 MG PO TABS Oral Take 1,500 mg by mouth.      [name] LEVOTHYROXINE SODIUM 50 MCG PO TABS Oral Take 50 mcg by mouth daily.     . MULTIVITAMINS PO TABS Oral Take 1 tablet by mouth daily.     . OXYCODONE-ACETAMINOPHEN 5-325 MG PO TABS Oral Take 1 tablet by mouth every 4 (four) hours as needed.    [name] TEMAZEPAM  15 MG PO CAPS Oral Take 15 mg by mouth at bedtime as needed.     . TRIAMCINOLONE ACETONIDE 0.1 % EX CREA Topical Apply 1 application topically 2 (two) times daily. MIXED IN CETAPHIL 1:1 AND THE DIRECTIONS ARE APPLY TO AFFECTED AREAS TWICE A DAY.       BP 158/77  Pulse 70  Temp(Src) 97.7 F (36.5 C) (Oral)  Resp 18  SpO2 100%  Physical Exam General: Well-developed, well-nourished female in no acute distress; appearance consistent with age of record HENT: normocephalic; small half centimeter laceration to top of scalp Eyes: pupils equal round and reactive to light; extraocular muscles intact Neck: supple; no C-spine tenderness Heart: regular rate and rhythm Lungs: clear to auscultation bilaterally Abdomen: soft; nondistended; nontender Extremities: No deformity; full range of motion; pulses normal; ecchymoses of right upper extremity noted without tenderness Neurologic: Awake, alert and oriented; motor function intact in all extremities and symmetric; no facial droop; negative Romberg; normal finger to  nose; normal gait Skin: Warm and dry Psychiatric: Normal mood and affect    ED Course  Procedures (including critical care time) LACERATION REPAIR Performed by: Harace Mccluney L Authorized by: Hanley Seamen Consent: Verbal consent obtained. Risks and benefits: risks, benefits and alternatives were discussed Consent given by: patient Patient identity confirmed: provided demographic data Prepped and Draped in normal sterile fashion Wound explored  Laceration Location: Top of head  Laceration Length: 0.5cm  No Foreign Bodies seen or palpated  Anesthesia: none  Irrigation method: syringe Amount of cleaning: standard  Skin closure: staple  Number of sutures: 1  Patient tolerance: Patient tolerated the procedure well with no immediate complications.    MDM  Son will stay with her overnight.        Hanley Seamen, MD 12/08/11 [phone]

## 2011-12-08 NOTE — ED Notes (Signed)
NGE:[id number]<UX> Expected date:12/08/11<BR> Expected time: 9:40 PM<BR> Means of arrival:Ambulance<BR> Comments:<BR> Fall, laceration to the head, not on blood thinners, 66yro female

## 2011-12-19 DIAGNOSIS — N644 Mastodynia: Secondary | ICD-10-CM | POA: Diagnosis not present

## 2011-12-20 ENCOUNTER — Other Ambulatory Visit: Payer: Self-pay | Admitting: Family Medicine

## 2011-12-20 DIAGNOSIS — M545 Low back pain: Secondary | ICD-10-CM | POA: Diagnosis not present

## 2011-12-20 DIAGNOSIS — S8000XA Contusion of unspecified knee, initial encounter: Secondary | ICD-10-CM | POA: Diagnosis not present

## 2011-12-20 DIAGNOSIS — Z1231 Encounter for screening mammogram for malignant neoplasm of breast: Secondary | ICD-10-CM

## 2011-12-20 DIAGNOSIS — M5137 Other intervertebral disc degeneration, lumbosacral region: Secondary | ICD-10-CM | POA: Diagnosis not present

## 2011-12-20 DIAGNOSIS — M25569 Pain in unspecified knee: Secondary | ICD-10-CM | POA: Diagnosis not present

## 2011-12-26 DIAGNOSIS — M25569 Pain in unspecified knee: Secondary | ICD-10-CM | POA: Diagnosis not present

## 2011-12-26 DIAGNOSIS — M5137 Other intervertebral disc degeneration, lumbosacral region: Secondary | ICD-10-CM | POA: Diagnosis not present

## 2011-12-27 DIAGNOSIS — M5137 Other intervertebral disc degeneration, lumbosacral region: Secondary | ICD-10-CM | POA: Diagnosis not present

## 2012-01-01 ENCOUNTER — Ambulatory Visit
Admission: RE | Admit: 2012-01-01 | Discharge: 2012-01-01 | Disposition: A | Discharge status: Home / Self Care | Payer: Medicare Other | Source: Ambulatory Visit | Attending: Family Medicine | Admitting: Family Medicine

## 2012-01-01 DIAGNOSIS — Z1231 Encounter for screening mammogram for malignant neoplasm of breast: Secondary | ICD-10-CM

## 2012-01-02 DIAGNOSIS — M5137 Other intervertebral disc degeneration, lumbosacral region: Secondary | ICD-10-CM | POA: Diagnosis not present

## 2012-01-02 DIAGNOSIS — M25569 Pain in unspecified knee: Secondary | ICD-10-CM | POA: Diagnosis not present

## 2012-01-03 ENCOUNTER — Other Ambulatory Visit: Payer: Self-pay | Admitting: Family Medicine

## 2012-01-03 DIAGNOSIS — R928 Other abnormal and inconclusive findings on diagnostic imaging of breast: Secondary | ICD-10-CM

## 2012-01-04 DIAGNOSIS — M25569 Pain in unspecified knee: Secondary | ICD-10-CM | POA: Diagnosis not present

## 2012-01-04 DIAGNOSIS — M5137 Other intervertebral disc degeneration, lumbosacral region: Secondary | ICD-10-CM | POA: Diagnosis not present

## 2012-01-09 DIAGNOSIS — M5137 Other intervertebral disc degeneration, lumbosacral region: Secondary | ICD-10-CM | POA: Diagnosis not present

## 2012-01-16 ENCOUNTER — Other Ambulatory Visit: Payer: Medicare Other

## 2012-01-16 DIAGNOSIS — M5137 Other intervertebral disc degeneration, lumbosacral region: Secondary | ICD-10-CM | POA: Diagnosis not present

## 2012-01-17 ENCOUNTER — Encounter (HOSPITAL_COMMUNITY): Payer: Self-pay | Admitting: Emergency Medicine

## 2012-01-17 ENCOUNTER — Inpatient Hospital Stay (HOSPITAL_COMMUNITY)
Admission: EM | Admit: 2012-01-17 | Discharge: 2012-01-20 | DRG: 689 | Disposition: A | Discharge status: Home / Self Care | Payer: Medicare Other | Attending: Internal Medicine | Admitting: Internal Medicine

## 2012-01-17 ENCOUNTER — Emergency Department (HOSPITAL_COMMUNITY): Payer: Medicare Other

## 2012-01-17 DIAGNOSIS — R443 Hallucinations, unspecified: Secondary | ICD-10-CM | POA: Diagnosis not present

## 2012-01-17 DIAGNOSIS — R110 Nausea: Secondary | ICD-10-CM | POA: Diagnosis not present

## 2012-01-17 DIAGNOSIS — E1169 Type 2 diabetes mellitus with other specified complication: Secondary | ICD-10-CM | POA: Diagnosis present

## 2012-01-17 DIAGNOSIS — Z9181 History of falling: Secondary | ICD-10-CM

## 2012-01-17 DIAGNOSIS — Y92009 Unspecified place in unspecified non-institutional (private) residence as the place of occurrence of the external cause: Secondary | ICD-10-CM

## 2012-01-17 DIAGNOSIS — M542 Cervicalgia: Secondary | ICD-10-CM | POA: Diagnosis not present

## 2012-01-17 DIAGNOSIS — Z96659 Presence of unspecified artificial knee joint: Secondary | ICD-10-CM

## 2012-01-17 DIAGNOSIS — Z66 Do not resuscitate: Secondary | ICD-10-CM | POA: Diagnosis present

## 2012-01-17 DIAGNOSIS — F329 Major depressive disorder, single episode, unspecified: Secondary | ICD-10-CM | POA: Diagnosis present

## 2012-01-17 DIAGNOSIS — S0081XA Abrasion of other part of head, initial encounter: Secondary | ICD-10-CM

## 2012-01-17 DIAGNOSIS — R0609 Other forms of dyspnea: Secondary | ICD-10-CM

## 2012-01-17 DIAGNOSIS — B952 Enterococcus as the cause of diseases classified elsewhere: Secondary | ICD-10-CM | POA: Diagnosis present

## 2012-01-17 DIAGNOSIS — IMO0002 Reserved for concepts with insufficient information to code with codable children: Secondary | ICD-10-CM | POA: Diagnosis not present

## 2012-01-17 DIAGNOSIS — E039 Hypothyroidism, unspecified: Secondary | ICD-10-CM | POA: Diagnosis present

## 2012-01-17 DIAGNOSIS — E785 Hyperlipidemia, unspecified: Secondary | ICD-10-CM | POA: Diagnosis present

## 2012-01-17 DIAGNOSIS — F3289 Other specified depressive episodes: Secondary | ICD-10-CM | POA: Diagnosis present

## 2012-01-17 DIAGNOSIS — N390 Urinary tract infection, site not specified: Secondary | ICD-10-CM | POA: Diagnosis not present

## 2012-01-17 DIAGNOSIS — I679 Cerebrovascular disease, unspecified: Secondary | ICD-10-CM | POA: Diagnosis present

## 2012-01-17 DIAGNOSIS — W010XXA Fall on same level from slipping, tripping and stumbling without subsequent striking against object, initial encounter: Secondary | ICD-10-CM | POA: Diagnosis present

## 2012-01-17 DIAGNOSIS — R296 Repeated falls: Secondary | ICD-10-CM

## 2012-01-17 DIAGNOSIS — R51 Headache: Secondary | ICD-10-CM | POA: Diagnosis not present

## 2012-01-17 DIAGNOSIS — W19XXXA Unspecified fall, initial encounter: Secondary | ICD-10-CM

## 2012-01-17 DIAGNOSIS — G9341 Metabolic encephalopathy: Secondary | ICD-10-CM | POA: Diagnosis present

## 2012-01-17 LAB — CBC
HCT: 36.5 % (ref 36.0–46.0)
Hemoglobin: 12.4 g/dL (ref 12.0–15.0)
MCH: 29.2 pg (ref 26.0–34.0)
MCHC: 34 g/dL (ref 30.0–36.0)
MCV: 86.1 fL (ref 78.0–100.0)
Platelets: 213 10*3/uL (ref 150–400)
RBC: 4.24 MIL/uL (ref 3.87–5.11)
RDW: 13.2 % (ref 11.5–15.5)
WBC: 9.5 10*3/uL (ref 4.0–10.5)

## 2012-01-17 LAB — BASIC METABOLIC PANEL
Calcium: 8.7 mg/dL (ref 8.4–10.5)
GFR calc Af Amer: >90 mL/min (ref >90)
GFR calc non Af Amer: 81 mL/min — ABNORMAL LOW (ref >90)

## 2012-01-17 LAB — BASIC METABOLIC PANEL WITH GFR
BUN: 20 mg/dL (ref 6–23)
CO2: 25 meq/L (ref 19–32)
Chloride: 102 meq/L (ref 96–112)
Creatinine, Ser: 0.76 mg/dL (ref 0.50–1.10)
Glucose, Bld: 115 mg/dL — ABNORMAL HIGH (ref 70–99)
Potassium: 3.3 meq/L — ABNORMAL LOW (ref 3.5–5.1)
Sodium: 136 meq/L (ref 135–145)

## 2012-01-17 LAB — URINE MICROSCOPIC-ADD ON

## 2012-01-17 LAB — URINALYSIS, ROUTINE W REFLEX MICROSCOPIC
Glucose, UA: NEGATIVE mg/dL
Ketones, ur: >80 mg/dL — AB
Nitrite: NEGATIVE
Protein, ur: NEGATIVE mg/dL
Specific Gravity, Urine: 1.025 (ref 1.005–1.030)
Urobilinogen, UA: 0.2 mg/dL (ref 0.0–1.0)
pH: 6.5 (ref 5.0–8.0)

## 2012-01-17 LAB — CK: Total CK: 3268 U/L — ABNORMAL HIGH (ref 7–177)

## 2012-01-17 LAB — DIFFERENTIAL
Basophils Absolute: 0 10*3/uL (ref 0.0–0.1)
Basophils Relative: 0 % (ref 0–1)
Eosinophils Absolute: 0.2 K/uL (ref 0.0–0.7)
Eosinophils Relative: 2 % (ref 0–5)
Lymphocytes Relative: 16 % (ref 12–46)
Lymphs Abs: 1.5 10*3/uL (ref 0.7–4.0)
Monocytes Absolute: 0.5 10*3/uL (ref 0.1–1.0)
Monocytes Relative: 6 % (ref 3–12)
Neutro Abs: 7.2 10*3/uL (ref 1.7–7.7)
Neutrophils Relative %: 76 % (ref 43–77)

## 2012-01-17 MED ORDER — SODIUM CHLORIDE 0.9 % IV SOLN
Freq: Once | INTRAVENOUS | Status: AC
  Administered 2012-01-17: 20:00:00 via INTRAVENOUS

## 2012-01-17 MED ORDER — SODIUM CHLORIDE 0.9 % IV BOLUS (SEPSIS)
500.0000 mL | Freq: Once | INTRAVENOUS | Status: AC
  Administered 2012-01-18: 500 mL via INTRAVENOUS

## 2012-01-17 MED ORDER — DEXTROSE 5 % IV SOLN
1.0000 g | Freq: Once | INTRAVENOUS | Status: AC
  Administered 2012-01-18: 1 g via INTRAVENOUS
  Filled 2012-01-17: qty 10

## 2012-01-17 MED ORDER — FENTANYL CITRATE 0.05 MG/ML IJ SOLN
50.0000 ug | Freq: Once | INTRAMUSCULAR | Status: AC
  Administered 2012-01-17: 50 ug via INTRAVENOUS
  Filled 2012-01-17: qty 2

## 2012-01-17 NOTE — ED Provider Notes (Addendum)
History     CSN: [id number]  Arrival date & time 01/17/12  1720   First MD Initiated Contact with Patient 01/17/12 1850      Chief Complaint  Patient presents with  . Fall  . Hallucinations    (Consider location/radiation/quality/duration/timing/severity/associated sxs/prior treatment) HPI Comments: History is obtained from the patient and daughter. Patient lives alone and uses a cane and a walker to help her get around it to severe balance problems. She reports that she falls frequently and has had this problem for several months. She was admitted in November 2012 after falling and developing visual hallucinations. She reports that she was seen by neurology, pulmonary and internal medicine as well as her primary care physician in order to try to figure out why she has been falling but no one has ever, but that reason. She denies that she actually passes out and denies any associated chest pain, shortness of breath or nausea or vomiting. Patient's symptoms had overall gotten somewhat improved but then began following again since yesterday and has fallen 3 times since. It started with falling after getting out of her car falling forward with abrasions to her chin and bruising to her right upper arm. She was evaluated by EMS, but did not want to come to the hospital because she was feeling improved. However she fell 2 more times last night and for a long period time actually was laying on the ground because she felt too weak to actually stand up. She began also having hallucinations as well seeing images and lites and also hearing music in the television on one was not. Patient denies any new medications recently. She reports that she does have a mild headache in the back of her head and requested pain medication. She denies neck or back pain. Again denies chest pain, abdominal pain, back pain. She denies fever, chills, cold symptoms or cough.  Patient is a 77 y.o. female presenting with fall. The  history is provided by the patient, a relative and medical records.  Fall Associated symptoms include headaches. Pertinent negatives include no fever and no abdominal pain.    Past Medical History  Diagnosis Date  . Hypotension   . Depression with anxiety   . Cough     Wert-onset 08/2009  . Deep venous thrombosis   . Cervicalgia   . Hypothyroidism   . Blood transfusion   . Constipation   . Shortness of breath   . Sleep apnea   . Arthritis   . Anxiety   . Dysrhythmia   . Pneumonia   . Anemia   . Depression   . Lumbar radiculopathy 09/26/2011  . Headache 09/27/2011    Past Surgical History  Procedure Date  . Replacement total knee bilateral   . Appendectomy   . Breast excisional biopsy     breast bxs on left x 2, breast bx of right x 1-all benign  . Laparoscopic ovarian cystectomy     Family History  Problem Relation Age of Onset  . Heart disease Father   . Malignant hyperthermia Father   . Deep vein thrombosis Son     History  Substance Use Topics  . Smoking status: Former Smoker -- 0.5 packs/day    Types: Cigarettes    Quit date: 11/20/1968  . Smokeless tobacco: Never Used   Comment: smoked for approx. 10 years  . Alcohol Use: 0.6 oz/week    1 Glasses of wine per week     rare  OB History    Grav Para Term Preterm Abortions TAB SAB Ect Mult Living                  Review of Systems  Constitutional: Negative for fever and chills.  HENT: Negative for congestion and rhinorrhea.   Respiratory: Negative for shortness of breath.   Cardiovascular: Negative for chest pain.  Gastrointestinal: Negative for abdominal pain.  Genitourinary: Negative for dysuria and frequency.  Musculoskeletal: Negative for back pain and arthralgias.  Skin: Positive for wound.  Neurological: Positive for headaches.  Psychiatric/Behavioral: Positive for hallucinations.  All other systems reviewed and are negative.    Allergies  Clindamycin hcl  Home Medications    Current Outpatient Rx  Name Route Sig Dispense Refill  . AMMONIUM LACTATE 12 % EX LOTN Topical Apply 1 application topically daily as needed. FOR DRY SKIN     . ASPIRIN 81 MG PO TBEC Oral Take 1 tablet (81 mg total) by mouth daily.    . BUPROPION HCL ER (XL) 300 MG PO TB24 Oral Take 300 mg by mouth daily.     [name] CALCIUM 1500 MG PO TABS Oral Take 1,500 mg by mouth.      [name] LEVOTHYROXINE SODIUM 50 MCG PO TABS Oral Take 50 mcg by mouth daily.     . MULTIVITAMINS PO TABS Oral Take 1 tablet by mouth daily.     . OXYCODONE-ACETAMINOPHEN 5-325 MG PO TABS Oral Take 1 tablet by mouth every 4 (four) hours as needed. pain    . TRIAMCINOLONE ACETONIDE 0.1 % EX CREA Topical Apply 1 application topically 2 (two) times daily as needed. Dry skin....Marland KitchenMIXED IN CETAPHIL 1:1 AND THE DIRECTIONS ARE APPLY TO AFFECTED AREAS TWICE A DAY.    [name] TEMAZEPAM 15 MG PO CAPS Oral Take 15 mg by mouth at bedtime.       BP 149/87  Pulse 104  Temp(Src) 98.3 F (36.8 C) (Oral)  Resp 20  SpO2 100%  Physical Exam  Nursing note and vitals reviewed. Constitutional: She is oriented to person, place, and time. She appears well-developed and well-nourished.  HENT:  Head: Normocephalic.    Eyes: Pupils are equal, round, and reactive to light.  Neck: Normal range of motion.  Cardiovascular: Normal rate.   Pulmonary/Chest: Effort normal and breath sounds normal.  Abdominal: Soft. She exhibits no distension. There is no tenderness.  Musculoskeletal:       Arms: Neurological: She is alert and oriented to person, place, and time. She has normal strength. No cranial nerve deficit. She exhibits normal muscle tone. Coordination normal. GCS eye subscore is 4. GCS verbal subscore is 5. GCS motor subscore is 6.  Skin: Skin is warm and dry.    ED Course  Procedures (including critical care time)  Labs Reviewed  BASIC METABOLIC PANEL - Abnormal; Notable for the following:    Potassium 3.3 (*)    Glucose, Bld 115 (*)    GFR calc  non Af Amer 81 (*)    All other components within normal limits  URINALYSIS, ROUTINE W REFLEX MICROSCOPIC - Abnormal; Notable for the following:    Hgb urine dipstick SMALL (*)    Bilirubin Urine SMALL (*)    Ketones, ur >80 (*)    Leukocytes, UA MODERATE (*)    All other components within normal limits  CK - Abnormal; Notable for the following:    Total CK 3268 (*)    All other components within normal limits  URINE MICROSCOPIC-ADD  ON - Abnormal; Notable for the following:    Bacteria, UA MANY (*)    All other components within normal limits  CBC  DIFFERENTIAL  URINE CULTURE   Ct Head Wo Contrast  01/17/2012  *RADIOLOGY REPORT*  Clinical Data:  77 year old female status post fall.  Pain.  CT HEAD WITHOUT CONTRAST CT CERVICAL SPINE WITHOUT CONTRAST  Technique:  Multidetector CT imaging of the head and cervical spine was performed following the standard protocol without intravenous contrast.  Multiplanar CT image reconstructions of the cervical spine were also generated.  Comparison:  Cervical MRI 09/26/2011.  Head CT 09/25/2011.  CT HEAD  Findings: Visualized paranasal sinuses and mastoids are clear.  No acute orbit or scalp soft tissue findings.  Calvarium intact.  Chronic confluent cerebral white matter hypodensity has not significantly changed.  No ventriculomegaly. No midline shift, mass effect, or evidence of mass lesion.  No acute intracranial hemorrhage identified.  No evidence of cortically based acute infarction identified.  No suspicious intracranial vascular hyperdensity.  IMPRESSION: Stable noncontrast CT of the head with chronic advanced white matter changes.  Cervical findings are below.  CT CERVICAL SPINE  Findings: Chronic straightening of cervical lordosis. Visualized skull base is intact.  No atlanto-occipital dissociation. Cervicothoracic junction alignment is within normal limits. Bilateral posterior element alignment is within normal limits. Chronic cervical facet degeneration  at C3-C4 and C4-C5.  Chronic advanced disc degeneration at C5-C6.  No acute cervical fracture. Clear lung apices.  Negative paraspinal soft tissues.  IMPRESSION: No acute fracture or listhesis identified in the cervical spine. Ligamentous injury is not excluded.  Original Report Authenticated By: Harley Hallmark, M.D.   Ct Cervical Spine Wo Contrast  01/17/2012  *RADIOLOGY REPORT*  Clinical Data:  77 year old female status post fall.  Pain.  CT HEAD WITHOUT CONTRAST CT CERVICAL SPINE WITHOUT CONTRAST  Technique:  Multidetector CT imaging of the head and cervical spine was performed following the standard protocol without intravenous contrast.  Multiplanar CT image reconstructions of the cervical spine were also generated.  Comparison:  Cervical MRI 09/26/2011.  Head CT 09/25/2011.  CT HEAD  Findings: Visualized paranasal sinuses and mastoids are clear.  No acute orbit or scalp soft tissue findings.  Calvarium intact.  Chronic confluent cerebral white matter hypodensity has not significantly changed.  No ventriculomegaly. No midline shift, mass effect, or evidence of mass lesion.  No acute intracranial hemorrhage identified.  No evidence of cortically based acute infarction identified.  No suspicious intracranial vascular hyperdensity.  IMPRESSION: Stable noncontrast CT of the head with chronic advanced white matter changes.  Cervical findings are below.  CT CERVICAL SPINE  Findings: Chronic straightening of cervical lordosis. Visualized skull base is intact.  No atlanto-occipital dissociation. Cervicothoracic junction alignment is within normal limits. Bilateral posterior element alignment is within normal limits. Chronic cervical facet degeneration at C3-C4 and C4-C5.  Chronic advanced disc degeneration at C5-C6.  No acute cervical fracture. Clear lung apices.  Negative paraspinal soft tissues.  IMPRESSION: No acute fracture or listhesis identified in the cervical spine. Ligamentous injury is not excluded.   Original Report Authenticated By: Harley Hallmark, M.D.     1. Hallucinations   2. Falls   3. Urinary tract infection   4. Abrasion of chin     Room air saturation is 100% and normal.  MDM   I reviewed the patient's CT scan myself. There is no acute abnormalities according to the radiologist. Patient does have evidence of a urinary tract infection, and  I suspect this is led to her change in mental status and worsening balance. I am assuming that her hallucinations may be a combination of both sundowning as well as the urinary tract infection. Given she lives alone and I have concerns about her safety with her falling at home it is not unreasonable to be admitted for IV antibiotics and monitoring for improvement of her hallucinations and ability to perform her ADLs. Patient and family are in agreement with this plan. Her vital signs are otherwise normal.      11:28 PM Pt to be seen and admit by [facility].  Gavin Pound. Oletta Lamas, MD 01/17/12 2328  Gavin Pound. Oletta Lamas, MD 01/17/12 2329

## 2012-01-17 NOTE — ED Notes (Signed)
Pt unable to urinate again

## 2012-01-17 NOTE — ED Notes (Signed)
Pt c/o falling 3 times since yesterday evening.  Pt also c/o auditory and visual hallucinations since yesterday.  Pt with abrasions to nose and chin from fall yesterday.  Pt also c/o pain to back of head from fall this am.

## 2012-01-18 ENCOUNTER — Encounter (HOSPITAL_COMMUNITY): Payer: Self-pay | Admitting: Family Medicine

## 2012-01-18 DIAGNOSIS — F329 Major depressive disorder, single episode, unspecified: Secondary | ICD-10-CM | POA: Diagnosis present

## 2012-01-18 DIAGNOSIS — I679 Cerebrovascular disease, unspecified: Secondary | ICD-10-CM | POA: Diagnosis present

## 2012-01-18 DIAGNOSIS — B952 Enterococcus as the cause of diseases classified elsewhere: Secondary | ICD-10-CM | POA: Diagnosis present

## 2012-01-18 DIAGNOSIS — N390 Urinary tract infection, site not specified: Secondary | ICD-10-CM | POA: Diagnosis present

## 2012-01-18 DIAGNOSIS — R443 Hallucinations, unspecified: Secondary | ICD-10-CM | POA: Diagnosis present

## 2012-01-18 DIAGNOSIS — Z9181 History of falling: Secondary | ICD-10-CM | POA: Diagnosis not present

## 2012-01-18 DIAGNOSIS — E785 Hyperlipidemia, unspecified: Secondary | ICD-10-CM | POA: Diagnosis present

## 2012-01-18 DIAGNOSIS — G9341 Metabolic encephalopathy: Secondary | ICD-10-CM | POA: Diagnosis present

## 2012-01-18 DIAGNOSIS — R197 Diarrhea, unspecified: Secondary | ICD-10-CM | POA: Diagnosis not present

## 2012-01-18 DIAGNOSIS — Z66 Do not resuscitate: Secondary | ICD-10-CM | POA: Diagnosis present

## 2012-01-18 DIAGNOSIS — E039 Hypothyroidism, unspecified: Secondary | ICD-10-CM | POA: Diagnosis not present

## 2012-01-18 DIAGNOSIS — Z96659 Presence of unspecified artificial knee joint: Secondary | ICD-10-CM | POA: Diagnosis not present

## 2012-01-18 DIAGNOSIS — E782 Mixed hyperlipidemia: Secondary | ICD-10-CM | POA: Diagnosis not present

## 2012-01-18 LAB — TSH: TSH: 2.369 u[IU]/mL (ref 0.350–4.500)

## 2012-01-18 MED ORDER — BUPROPION HCL ER (XL) 300 MG PO TB24
300.0000 mg | ORAL_TABLET | Freq: Every day | ORAL | Status: DC
Start: 2012-01-18 — End: 2012-01-20
  Administered 2012-01-18 – 2012-01-20 (×3): 300 mg via ORAL
  Filled 2012-01-18 (×4): qty 1

## 2012-01-18 MED ORDER — DEXTROSE 5 % IV SOLN
1.0000 g | INTRAVENOUS | Status: DC
  Administered 2012-01-18 – 2012-01-19 (×2): 1 g via INTRAVENOUS
  Filled 2012-01-18 (×2): qty 10

## 2012-01-18 MED ORDER — LEVOTHYROXINE SODIUM 50 MCG PO TABS
50.0000 ug | ORAL_TABLET | Freq: Every day | ORAL | Status: DC
  Administered 2012-01-19 – 2012-01-20 (×2): 50 ug via ORAL
  Filled 2012-01-18 (×3): qty 1

## 2012-01-18 MED ORDER — POTASSIUM CHLORIDE CRYS ER 20 MEQ PO TBCR
40.0000 meq | EXTENDED_RELEASE_TABLET | Freq: Once | ORAL | Status: AC
  Administered 2012-01-18: 40 meq via ORAL
  Filled 2012-01-18 (×2): qty 2

## 2012-01-18 MED ORDER — ENOXAPARIN SODIUM 40 MG/0.4ML ~~LOC~~ SOLN
40.0000 mg | SUBCUTANEOUS | Status: DC
  Administered 2012-01-18 – 2012-01-19 (×2): 40 mg via SUBCUTANEOUS
  Filled 2012-01-18 (×4): qty 0.4

## 2012-01-18 MED ORDER — OXYCODONE-ACETAMINOPHEN 5-325 MG PO TABS
1.0000 | ORAL_TABLET | Freq: Three times a day (TID) | ORAL | Status: DC | PRN
  Administered 2012-01-18 – 2012-01-19 (×3): 1 via ORAL
  Filled 2012-01-18 (×4): qty 1

## 2012-01-18 MED ORDER — AMMONIUM LACTATE 12 % EX LOTN
1.0000 "application " | TOPICAL_LOTION | CUTANEOUS | Status: DC | PRN
  Filled 2012-01-18: qty 400

## 2012-01-18 MED ORDER — SODIUM CHLORIDE 0.9 % IJ SOLN
3.0000 mL | INTRAMUSCULAR | Status: DC | PRN
  Filled 2012-01-18: qty 3

## 2012-01-18 MED ORDER — ASPIRIN EC 81 MG PO TBEC
81.0000 mg | DELAYED_RELEASE_TABLET | Freq: Every day | ORAL | Status: DC
  Administered 2012-01-18 – 2012-01-20 (×3): 81 mg via ORAL
  Filled 2012-01-18 (×4): qty 1

## 2012-01-18 NOTE — Progress Notes (Signed)
Met with patient this a.m.  She lives alone and has had recent, frequent falls.  Patient is alert and oriented. States that she has been falling at different times since November 2012 with multiple works ups by MD and Neuro without any significant results. Patient feels that falls are related to UTI's; she also reports some auditory and visual hallucinations that she states only happen when she has a UTI. She describes these hallucinations as "funny"- never fearful or persecutory. She still drives (daytime only) and relates that she is proud of being able to remain independent. Patient wears a Life Alert device on her wrist and states that she keeps in on all the time. Her son Andrea [name] will be moving in with her this weekend; he works days but will be with her at night. Patient defers any interest in obtaining daytime assistance at home even though her daughter Andrea [name] would like her to have help. Above information confirmed with daughter Andrea [name] per patient's permission.  Will ask RNCM to provide list of private duty agencies as a resource but patient does not appear to be receptive to this option at this time.  Patient will return home once medically stable and denies any SW needs. Social Worker will discuss with RNCM and sign off. Bradly Bienenstock, SW Intern 01/18/2012 9:20 AM

## 2012-01-18 NOTE — Progress Notes (Signed)
Subjective: Patient feeling better. No headaches or light headness.  Objective: Filed Vitals:   01/17/12 1941 01/18/12 0035 01/18/12 0138 01/18/12 0635  BP:  120/53 122/38 154/70  Pulse:  82 81 87  Temp:  98 F (36.7 C) 98.4 F (36.9 C) 98.4 F (36.9 C)  TempSrc:  Oral Oral Oral  Resp:  16 16 16   SpO2: 100% 95% 100% 98%   Weight change:  No intake or output data in the 24 hours ending 01/18/12 1425  General: Alert, awake, oriented x3, in no acute distress.  HEENT: No bruits, no goiter.  Heart: Regular rate and rhythm, without murmurs, rubs, gallops.  Lungs: CTA bilateral air movement.  Abdomen: Soft, nontender, nondistended, positive bowel sounds.  Neuro: Grossly intact, nonfocal.   Lab Results:  [facility] 01/17/12 2030  NA 136  K 3.3*  CL 102  CO2 25  GLUCOSE 115*  BUN 20  CREATININE 0.76  CALCIUM 8.7  MG --  PHOS --    Basename 01/17/12 2030  WBC 9.5  NEUTROABS 7.2  HGB 12.4  HCT 36.5  MCV 86.1  PLT 213    Basename 01/17/12 2030  CKTOTAL 3268*  CKMB --  CKMBINDEX --  TROPONINI --   Basename 01/18/12  TSH 2.369  T4TOTAL --  T3FREE --  THYROIDAB --   Studies/Results: Ct Head Wo Contrast  01/17/2012  *RADIOLOGY REPORT*  Clinical Data:  77 year old female status post fall.  Pain.  CT HEAD WITHOUT CONTRAST CT CERVICAL SPINE WITHOUT CONTRAST  Technique:  Multidetector CT imaging of the head and cervical spine was performed following the standard protocol without intravenous contrast.  Multiplanar CT image reconstructions of the cervical spine were also generated.  Comparison:  Cervical MRI 09/26/2011.  Head CT 09/25/2011.  CT HEAD  Findings: Visualized paranasal sinuses and mastoids are clear.  No acute orbit or scalp soft tissue findings.  Calvarium intact.  Chronic confluent cerebral white matter hypodensity has not significantly changed.  No ventriculomegaly. No midline shift, mass effect, or evidence of mass lesion.  No acute intracranial hemorrhage  identified.  No evidence of cortically based acute infarction identified.  No suspicious intracranial vascular hyperdensity.  IMPRESSION: Stable noncontrast CT of the head with chronic advanced white matter changes.  Cervical findings are below.  CT CERVICAL SPINE  Findings: Chronic straightening of cervical lordosis. Visualized skull base is intact.  No atlanto-occipital dissociation. Cervicothoracic junction alignment is within normal limits. Bilateral posterior element alignment is within normal limits. Chronic cervical facet degeneration at C3-C4 and C4-C5.  Chronic advanced disc degeneration at C5-C6.  No acute cervical fracture. Clear lung apices.  Negative paraspinal soft tissues.  IMPRESSION: No acute fracture or listhesis identified in the cervical spine. Ligamentous injury is not excluded.  Original Report Authenticated By: Harley Hallmark, M.D.   Ct Cervical Spine Wo Contrast  01/17/2012  *RADIOLOGY REPORT*  Clinical Data:  77 year old female status post fall.  Pain.  CT HEAD WITHOUT CONTRAST CT CERVICAL SPINE WITHOUT CONTRAST  Technique:  Multidetector CT imaging of the head and cervical spine was performed following the standard protocol without intravenous contrast.  Multiplanar CT image reconstructions of the cervical spine were also generated.  Comparison:  Cervical MRI 09/26/2011.  Head CT 09/25/2011.  CT HEAD  Findings: Visualized paranasal sinuses and mastoids are clear.  No acute orbit or scalp soft tissue findings.  Calvarium intact.  Chronic confluent cerebral white matter hypodensity has not significantly changed.  No ventriculomegaly. No midline shift, mass effect, or  evidence of mass lesion.  No acute intracranial hemorrhage identified.  No evidence of cortically based acute infarction identified.  No suspicious intracranial vascular hyperdensity.  IMPRESSION: Stable noncontrast CT of the head with chronic advanced white matter changes.  Cervical findings are below.  CT CERVICAL SPINE   Findings: Chronic straightening of cervical lordosis. Visualized skull base is intact.  No atlanto-occipital dissociation. Cervicothoracic junction alignment is within normal limits. Bilateral posterior element alignment is within normal limits. Chronic cervical facet degeneration at C3-C4 and C4-C5.  Chronic advanced disc degeneration at C5-C6.  No acute cervical fracture. Clear lung apices.  Negative paraspinal soft tissues.  IMPRESSION: No acute fracture or listhesis identified in the cervical spine. Ligamentous injury is not excluded.  Original Report Authenticated By: Harley Hallmark, M.D.    Medications: I have reviewed the patient's current medications.   Patient Active Hospital Problem List:  Falls frequently (09/25/2011)  PT, OT .  CT head negative.   Hypothyroidism (09/25/2011) Continue with synthroid.    Hallucination (01/18/2012) In the setting of infection.   UTI (lower urinary tract infection) (01/18/2012) Ceftriaxone. Follow urine culture.      LOS: 1 day   Andrea [name] M.D.  [facility] 01/18/2012, 2:25 PM

## 2012-01-18 NOTE — Progress Notes (Signed)
This assessment/evaluation/note was completed by a Child psychotherapist of Social Work Warden/ranger and as Investment banker, corporate, I was immediately available for consultation/collaboration. I am in agreement with the contents and disposition reflected in the assessment/evaluation/note(s).   Ileene Hutchinson , MSW, LCSWA 01/18/2012 9:26 AM [phone]

## 2012-01-18 NOTE — H&P (Signed)
PCP:   Karie Chimera, MD, MD   Chief Complaint:  hallucination and fall  HPI: This is a pleasant 77 year old female who started having hallucinations and falling since yesterday. Yesterday she fell getting the mail and again twice today. She was too weak to get up. She did hit her head on the concrete yesterday, but reports no loss of consciousness. The patient does have a tendency to fall, however, is been worse over the past 2 days. Additionally she hass developing hallucinations, both visual and auditory. She does report a burning with urination and a low-grade fever. She reports no chest pains or palpitations.  Of note, patient has had these symptoms on and off for the past 2 years, usually associated with urinary tract infections. She did have a traumatic brain injury several years ago. She has seen multiple specialists and workup, with no revealing findings. Current been the patient has hallucinations, she doesn't appear to be very confused. She is one was provided all the history. No family members are present at the bedside. Patient lives alone, but her symptoms over the weekend.  Review of Systems: Positives bolded   anorexia, fever, weight loss,, vision loss, decreased hearing, hoarseness, chest pain, syncope, dyspnea on exertion, peripheral edema, balance deficits, hemoptysis, abdominal pain, melena, hematochezia, severe indigestion/heartburn, hematuria, incontinence, genital sores, muscle weakness, suspicious skin lesions, transient blindness, difficulty walking, depression, unusual weight change, abnormal bleeding, enlarged lymph nodes, angioedema, and breast masses.  Past Medical History: Past Medical History  Diagnosis Date  . Hypotension   . Depression with anxiety   . Cough     Wert-onset 08/2009  . Deep venous thrombosis   . Cervicalgia   . Hypothyroidism   . Blood transfusion   . Constipation   . Shortness of breath   . Sleep apnea   . Arthritis   . Anxiety   .  Dysrhythmia   . Pneumonia   . Anemia   . Depression   . Lumbar radiculopathy 09/26/2011  . Headache 09/27/2011   Past Surgical History  Procedure Date  . Replacement total knee bilateral   . Appendectomy   . Breast excisional biopsy     breast bxs on left x 2, breast bx of right x 1-all benign  . Laparoscopic ovarian cystectomy     Medications: Prior to Admission medications   Medication Sig Start Date End Date Taking? Authorizing Provider  ammonium lactate (LAC-HYDRIN) 12 % lotion Apply 1 application topically daily as needed. FOR DRY SKIN    Yes Historical Provider, MD  aspirin EC 81 MG EC tablet Take 1 tablet (81 mg total) by mouth daily. 09/28/11 09/27/12 Yes Christina Rama, MD  buPROPion (WELLBUTRIN XL) 300 MG 24 hr tablet Take 300 mg by mouth daily.    Yes Historical Provider, MD  Calcium 1500 MG tablet Take 1,500 mg by mouth.     Yes Historical Provider, MD  levothyroxine (SYNTHROID, LEVOTHROID) 50 MCG tablet Take 50 mcg by mouth daily.    Yes Historical Provider, MD  multivitamin [facility]) per tablet Take 1 tablet by mouth daily.    Yes Historical Provider, MD  oxyCODONE-acetaminophen (PERCOCET) 5-325 MG per tablet Take 1 tablet by mouth every 4 (four) hours as needed. pain   Yes Historical Provider, MD  triamcinolone (KENALOG) 0.1 % cream Apply 1 application topically 2 (two) times daily as needed. Dry skin....Marland KitchenMIXED IN CETAPHIL 1:1 AND THE DIRECTIONS ARE APPLY TO AFFECTED AREAS TWICE A DAY.   Yes Historical Provider, MD  temazepam (  RESTORIL) 15 MG capsule Take 15 mg by mouth at bedtime.     Historical Provider, MD    Allergies:   Allergies  Allergen Reactions  . Clindamycin Hcl Other (See Comments)    REACTION: swelling    Social History:  reports that she quit smoking about 43 years ago. Her smoking use included Cigarettes. She smoked .5 packs per day. She has never used smokeless tobacco. She reports that she drinks about .6 ounces of alcohol per week. She reports  that she does not use illicit drugs. patient lives alone, uses a wheelchair mainly.  Family History: Family History  Problem Relation Age of Onset  . Heart disease Father   . Malignant hyperthermia Father   . Deep vein thrombosis Son     Physical Exam: Filed Vitals:   01/17/12 1916 01/17/12 1941 01/18/12 0035 01/18/12 0138  BP: 149/87  120/53 122/38  Pulse: 104  82 81  Temp:   98 F (36.7 C) 98.4 F (36.9 C)  TempSrc:   Oral Oral  Resp:   16 16  SpO2:  100% 95% 100%    General:  Alert and oriented times three, well developed and nourished, no acute distress Eyes: PERRLA, pink conjunctiva, no scleral icterus ENT: Moist oral mucosa, neck supple, no thyromegaly, dentures Lungs: clear to ascultation, no wheeze, no crackles, no use of accessory muscles Cardiovascular: regular rate and rhythm, no regurgitation, no gallops, no murmurs. No carotid bruits, no JVD Abdomen: soft, positive BS, non-tender, non-distended, no organomegaly, not an acute abdomen GU: not examined Neuro: CN II - XII grossly intact, sensation intact Musculoskeletal: strength 5/5 all extremities, no clubbing, cyanosis or edema Skin: no rash, no subcutaneous crepitation, no decubitus, xerosis bilateral lower extremity, abrasion on left cheek, bruising right arm Psych: appropriate patient   Labs on Admission:   Basename 01/17/12 2030  NA 136  K 3.3*  CL 102  CO2 25  GLUCOSE 115*  BUN 20  CREATININE 0.76  CALCIUM 8.7  MG --  PHOS --   No results found for this basename: AST:2,ALT:2,ALKPHOS:2,BILITOT:2,PROT:2,ALBUMIN:2 in the last 72 hours No results found for this basename: LIPASE:2,AMYLASE:2 in the last 72 hours  Basename 01/17/12 2030  WBC 9.5  NEUTROABS 7.2  HGB 12.4  HCT 36.5  MCV 86.1  PLT 213    Basename 01/17/12 2030  CKTOTAL 3268*  CKMB --  CKMBINDEX --  TROPONINI --   No components found with this basename: POCBNP:3 No results found for this basename: DDIMER:2 in the last 72  hours No results found for this basename: HGBA1C:2 in the last 72 hours No results found for this basename: CHOL:2,HDL:2,LDLCALC:2,TRIG:2,CHOLHDL:2,LDLDIRECT:2 in the last 72 hours No results found for this basename: TSH,T4TOTAL,FREET3,T3FREE,THYROIDAB in the last 72 hours No results found for this basename: VITAMINB12:2,FOLATE:2,FERRITIN:2,TIBC:2,IRON:2,RETICCTPCT:2 in the last 72 hours  Micro Results: No results found for this or any previous visit (from the past 240 hour(s)). Results for JULINE, SANDERFORD (MRN [id number]) as of 01/18/2012 01:55  Ref. Range 01/17/2012 21:51  Color, Urine Latest Range: YELLOW  YELLOW  APPearance Latest Range: CLEAR  CLEAR  Specific Gravity, Urine Latest Range: 1.005-1.030  1.025  pH Latest Range: 5.0-8.0  6.5  Glucose, UA Latest Range: NEGATIVE mg/dL NEGATIVE  Bilirubin Urine Latest Range: NEGATIVE  SMALL (A)  Ketones, ur Latest Range: NEGATIVE mg/dL >40 (A)  Protein Latest Range: NEGATIVE mg/dL NEGATIVE  Urobilinogen, UA Latest Range: 0.0-1.0 mg/dL 0.2  Nitrite Latest Range: NEGATIVE  NEGATIVE  Leukocytes, UA Latest Range:  NEGATIVE  MODERATE (A)  Hgb urine dipstick Latest Range: NEGATIVE  SMALL (A)  Urine-Other No range found MUCOUS PRESENT  WBC, UA Latest Range: <3 WBC/hpf 21-50  RBC / HPF Latest Range: <3 RBC/hpf 3-6  Squamous Epithelial / LPF Latest Range: RARE  RARE  Bacteria, UA Latest Range: RARE  MANY (A)    Radiological Exams on Admission: Ct Head Wo Contrast  01/17/2012  *RADIOLOGY REPORT*  Clinical Data:  77 year old female status post fall.  Pain.  CT HEAD WITHOUT CONTRAST CT CERVICAL SPINE WITHOUT CONTRAST  Technique:  Multidetector CT imaging of the head and cervical spine was performed following the standard protocol without intravenous contrast.  Multiplanar CT image reconstructions of the cervical spine were also generated.  Comparison:  Cervical MRI 09/26/2011.  Head CT 09/25/2011.  CT HEAD  Findings: Visualized paranasal sinuses and  mastoids are clear.  No acute orbit or scalp soft tissue findings.  Calvarium intact.  Chronic confluent cerebral white matter hypodensity has not significantly changed.  No ventriculomegaly. No midline shift, mass effect, or evidence of mass lesion.  No acute intracranial hemorrhage identified.  No evidence of cortically based acute infarction identified.  No suspicious intracranial vascular hyperdensity.  IMPRESSION: Stable noncontrast CT of the head with chronic advanced white matter changes.  Cervical findings are below.  CT CERVICAL SPINE  Findings: Chronic straightening of cervical lordosis. Visualized skull base is intact.  No atlanto-occipital dissociation. Cervicothoracic junction alignment is within normal limits. Bilateral posterior element alignment is within normal limits. Chronic cervical facet degeneration at C3-C4 and C4-C5.  Chronic advanced disc degeneration at C5-C6.  No acute cervical fracture. Clear lung apices.  Negative paraspinal soft tissues.  IMPRESSION: No acute fracture or listhesis identified in the cervical spine. Ligamentous injury is not excluded.  Original Report Authenticated By: Harley Hallmark, M.D.   Ct Cervical Spine Wo Contrast  01/17/2012  *RADIOLOGY REPORT*  Clinical Data:  77 year old female status post fall.  Pain.  CT HEAD WITHOUT CONTRAST CT CERVICAL SPINE WITHOUT CONTRAST  Technique:  Multidetector CT imaging of the head and cervical spine was performed following the standard protocol without intravenous contrast.  Multiplanar CT image reconstructions of the cervical spine were also generated.  Comparison:  Cervical MRI 09/26/2011.  Head CT 09/25/2011.  CT HEAD  Findings: Visualized paranasal sinuses and mastoids are clear.  No acute orbit or scalp soft tissue findings.  Calvarium intact.  Chronic confluent cerebral white matter hypodensity has not significantly changed.  No ventriculomegaly. No midline shift, mass effect, or evidence of mass lesion.  No acute  intracranial hemorrhage identified.  No evidence of cortically based acute infarction identified.  No suspicious intracranial vascular hyperdensity.  IMPRESSION: Stable noncontrast CT of the head with chronic advanced white matter changes.  Cervical findings are below.  CT CERVICAL SPINE  Findings: Chronic straightening of cervical lordosis. Visualized skull base is intact.  No atlanto-occipital dissociation. Cervicothoracic junction alignment is within normal limits. Bilateral posterior element alignment is within normal limits. Chronic cervical facet degeneration at C3-C4 and C4-C5.  Chronic advanced disc degeneration at C5-C6.  No acute cervical fracture. Clear lung apices.  Negative paraspinal soft tissues.  IMPRESSION: No acute fracture or listhesis identified in the cervical spine. Ligamentous injury is not excluded.  Original Report Authenticated By: Harley Hallmark, M.D.    Assessment/Plan Present on Admission:  .Hallucination/encephalopathy Sepsis d/t UTI  Admit to MedSurg   Blood cultures and urine cultures collected  Start IV antibiotics  Even though  patient with hallucinations, she is at baseline number confused  Falls - chronic Likely worse his UTI  Physical therapy consulted  .CVD (cerebrovascular disease) .Dyslipidemia .Hypothyroidism  Stable, resume home medications TSH ordered  DO NOT RESUSCITATE DVT prophylaxis Team 5/Dr. Art Buff, Caidin Heidenreich 01/18/2012, 1:58 AM

## 2012-01-18 NOTE — ED Notes (Signed)
Patient here from main ED.  A/O x 2-3 . Placed in room 32.  Lungs clear but diminished in the bases.  S1S2 noted.Peri Jefferson cap refill <3 sec.  Patient sts that she just wants to go to sleep

## 2012-01-19 DIAGNOSIS — E039 Hypothyroidism, unspecified: Secondary | ICD-10-CM | POA: Diagnosis not present

## 2012-01-19 DIAGNOSIS — R443 Hallucinations, unspecified: Secondary | ICD-10-CM | POA: Diagnosis not present

## 2012-01-19 DIAGNOSIS — E782 Mixed hyperlipidemia: Secondary | ICD-10-CM | POA: Diagnosis not present

## 2012-01-19 DIAGNOSIS — R197 Diarrhea, unspecified: Secondary | ICD-10-CM | POA: Diagnosis not present

## 2012-01-19 LAB — URINE CULTURE
Colony Count: >=100000
Culture  Setup Time: 201302280105

## 2012-01-19 LAB — BASIC METABOLIC PANEL
BUN: 17 mg/dL (ref 6–23)
CO2: 25 mEq/L (ref 19–32)
Calcium: 8.7 mg/dL (ref 8.4–10.5)
Chloride: 104 mEq/L (ref 96–112)
Creatinine, Ser: 0.75 mg/dL (ref 0.50–1.10)
Glucose, Bld: 112 mg/dL — ABNORMAL HIGH (ref 70–99)

## 2012-01-19 LAB — CBC
MCH: 29.4 pg (ref 26.0–34.0)
MCV: 88 fL (ref 78.0–100.0)
Platelets: 207 10*3/uL (ref 150–400)
RBC: 4.25 MIL/uL (ref 3.87–5.11)

## 2012-01-19 MED ORDER — ONDANSETRON 4 MG PO TBDP
4.0000 mg | ORAL_TABLET | Freq: Four times a day (QID) | ORAL | Status: DC | PRN
  Administered 2012-01-20 (×2): 4 mg via ORAL
  Filled 2012-01-19 (×3): qty 1

## 2012-01-19 MED ORDER — ZOLPIDEM TARTRATE 5 MG PO TABS
5.0000 mg | ORAL_TABLET | Freq: Every evening | ORAL | Status: DC | PRN
  Administered 2012-01-19: 5 mg via ORAL
  Filled 2012-01-19: qty 1

## 2012-01-19 MED ORDER — TEMAZEPAM 15 MG PO CAPS: 15.0000 mg | ORAL_CAPSULE | Freq: Every day | ORAL | Status: DC

## 2012-01-19 MED ORDER — AMPICILLIN 500 MG PO CAPS
500.0000 mg | ORAL_CAPSULE | Freq: Three times a day (TID) | ORAL | Status: DC
  Administered 2012-01-19 – 2012-01-20 (×4): 500 mg via ORAL
  Filled 2012-01-19 (×9): qty 1

## 2012-01-19 NOTE — Clinical Documentation Improvement (Signed)
CHANGE MENTAL STATUS DOCUMENTATION CLARIFICATION   THIS DOCUMENT IS NOT A PERMANENT PART OF THE MEDICAL RECORD  TO RESPOND TO THE THIS QUERY, FOLLOW THE INSTRUCTIONS BELOW:  1. If needed, update documentation for the patient's encounter via the notes activity.  2. Access this query again and click edit on the In [employer].  3. After updating, or not, click F2 to complete all highlighted (required) fields concerning your review. Select "additional documentation in the medical record" OR "no additional documentation provided".  4. Click Sign note button.  5. The deficiency will fall out of your In Basket *Please let us know if you are not able to complete this workflow by phone or e-mail (listed below).         01/19/12  Dear Dr. Sunnie Nielsen Marton Redwood  In an effort to better capture your patient's severity of illness, reflect appropriate length of stay and utilization of resources, a review of the patient medical record has revealed the following indicators.    Based on your clinical judgment, please clarify and document in a progress note and/or discharge summary the clinical condition associated with the following supporting information:  In responding to this query please exercise your independent judgment.  The fact that a query is asked, does not imply that any particular answer is desired or expected. According to admission note, pt hallucination is linked with acute  encephalopathy. Please clarify whether or not dx can be further specified as one of the diagnoses listed below and document in pn or d/c summary. Thank you.  Possible Clinical Conditions?  _______Encephalopathy (describe type if known)                                              Septic                                               Hepatic                       Hypertensive                       Metabolic                       _______ Toxic _______Drug induced confusion/delirium _______Acute delirium _______New  diagnosis of Dementia, Alzheimer's,  _______Other Condition__________________ _______Cannot Clinically Determine   Supporting Information:  Risk Factors:UTI,  History traumatic brain injury, Depression, Lumbar Radiculopathy, Hypothyroidism,   Signs & Symptoms:   Pt reports: "admitted in November 2012 after falling and developing visual         hallucinations, reports mild headache,"    per H&P: " Assessment/Plan Present on Admission: Diagnosis: acute encephalopathy .Hallucination/encephalopathy developing hallucinations, both visual and auditory; hit her head on the concrete yesterday  per Ed note:" evidence UTI ,  suspect this is led to  change in mental status and worsening balance  CT scan head> IMPRESSION: Stable noncontrast CT of the head with chronic advanced white matter changes  Treatment:Monitoring  Staff assist    Reviewed: additional documentation in the medical record  Thank You,  Andy Gauss RN  Clinical Documentation Specialist:  Pager [phone] E-mail [email or web address]  Health  Information Management

## 2012-01-19 NOTE — Evaluation (Signed)
Physical Therapy Evaluation Patient Details Name: Andrea [name] MRN: [id number] DOB: 11/13/1935 Today's Date: 01/19/2012  Pt defers HHPT, she wants to return home and drive herself to her outpatient PT.  Began to discuss patient options other than driving is she is not safe to drive including her son taking her to therapy and [employer] bus.. Pt has all DME at home  Problem List:  Patient Active Problem List  Diagnoses  . DEPRESSION/ANXIETY  . HYPOTENSION  . Edema  . DYSPNEA  . Cough  . CHEST PAIN  . Nonspecific (abnormal) findings on radiological and other examination of body structure  . ABNORMAL LUNG XRAY  . DVT (deep venous thrombosis)  . Encounter for long-term (current) use of anticoagulants  . Falls frequently  . CVD (cerebrovascular disease)  . Hyponatremia  . Obesity, morbid  . Hypothyroidism  . Delirium  . Dyslipidemia  . Lumbar radiculopathy  . Muscle spasm of back  . Visual disturbance  . Headache  . H/O: encephalitis  . Hallucination  . UTI (lower urinary tract infection)    Past Medical History:  Past Medical History  Diagnosis Date  . Hypotension   . Depression with anxiety   . Cough     Wert-onset 08/2009  . Deep venous thrombosis   . Cervicalgia   . Hypothyroidism   . Blood transfusion   . Constipation   . Shortness of breath   . Sleep apnea   . Arthritis   . Anxiety   . Dysrhythmia   . Pneumonia   . Anemia   . Depression   . Lumbar radiculopathy 09/26/2011  . Headache 09/27/2011   Past Surgical History:  Past Surgical History  Procedure Date  . Replacement total knee bilateral   . Appendectomy   . Breast excisional biopsy     breast bxs on left x 2, breast bx of right x 1-all benign  . Laparoscopic ovarian cystectomy     PT Assessment/Plan/Recommendation PT Assessment Clinical Impression Statement: 77 yo with strong history of falls admitted with UTI and more falls.  Pt defers HHPT, but wants to resure Outpatient PT (she knows how to set  up appt) when she is readyl  Pt issued a home program that she is able to to do for strenghening at home PT Recommendation/Assessment: Patient will need skilled PT in the acute care venue PT Problem List: Decreased activity tolerance;Decreased strength;Decreased mobility PT Therapy Diagnosis : Difficulty walking;Abnormality of gait;Generalized weakness PT Plan PT Frequency: Min 3X/week PT Treatment/Interventions: DME instruction;Gait training;Functional mobility training;Stair training;Therapeutic activities PT Recommendation Recommendations for Other Services: OT consult Follow Up Recommendations: Home health PT;Outpatient PT;Other (comment) (HHPT if patient will agree) Equipment Recommended: None recommended by PT PT Goals  Acute Rehab PT Goals PT Goal Formulation: With patient Time For Goal Achievement: 2 weeks Pt will Roll Supine to Right Side: with modified independence PT Goal: Rolling Supine to Right Side - Progress: Goal set today Pt will Roll Supine to Left Side: with modified independence PT Goal: Rolling Supine to Left Side - Progress: Goal set today Pt will go Sit to Supine/Side: Independently PT Goal: Sit to Supine/Side - Progress: Goal set today Pt will Ambulate: >150 feet;with modified independence;with least restrictive assistive device PT Goal: Ambulate - Progress: Goal set today Pt will Go Up / Down Stairs: 1-2 stairs;with modified independence;with least restrictive assistive device PT Goal: Up/Down Stairs - Progress: Goal set today  PT Evaluation Precautions/Restrictions  Precautions Precautions: Fall Required Braces or Orthoses: No Restrictions  Weight Bearing Restrictions: No Prior Functioning  Home Living Lives With: Alone;Other (Comment) (Pt son to move in w/ her 01/20/12) Dolores Lory Help From: Other (Comment) (Son moving in w/ pt 01/20/12; has [employer]) Type of Home: House Home Layout: One level Home Access: Stairs to enter Entrance Stairs-Rails:  None Entrance Stairs-Number of Steps: I small Lip/Step Bathroom Shower/Tub: Engineer, manufacturing systems: Standard Bathroom Accessibility: Yes How Accessible: Accessible via walker Home Adaptive Equipment: Bedside commode/3-in-1;Shower chair with back;Reacher;Sock aid;Straight cane;Walker - rolling;Walker - four wheeled;Hand-held shower hose Prior Function Level of Independence: Independent with transfers;Independent with gait Driving: Yes Cognition Cognition Arousal/Alertness: Awake/alert Overall Cognitive Status: Appears within functional limits for tasks assessed Orientation Level: Oriented X4 Cognition - Other Comments: pt reports being tired after OT and showering today Sensation/Coordination Coordination Gross Motor Movements are Fluid and Coordinated: Yes Fine Motor Movements are Fluid and Coordinated: Yes Extremity Assessment RLE Assessment RLE Assessment: Within Functional Limits LLE Assessment LLE Assessment: Within Functional Limits Mobility (including Balance) Bed Mobility Bed Mobility: Yes Rolling Right: 4: Min assist Rolling Right Details (indicate cue type and reason): pt reports inability to roll is new problem.?? motor planning?? pt instructed to use opposite leg to push , turn head and eys to diirection of roll and reach with arm Rolling Left: 4: Min assist Rolling Left Details (indicate cue type and reason): problem rolling this way also, but easier, pt used opposite leg to assist Supine to Sit: 5: Supervision Supine to Sit Details (indicate cue type and reason): xtra time to maneuver on mattress and push body up onto elbow pt uses a modified flat spin technique since rolling is difficult for her Transfers Transfers: Yes Sit to Stand: 6: Modified independent (Device/Increase time) Stand to Sit: 6: Modified independent (Device/Increase time) Ambulation/Gait Ambulation/Gait: Yes Ambulation/Gait Assistance: 6: Modified independent (Device/Increase  time) Ambulation Distance (Feet): 150 Feet Assistive device: Rolling walker Gait Pattern: Step-through pattern Gait velocity: decreased Stairs: No  Posture/Postural Control Posture/Postural Control: Postural limitations Postural Limitations: obesity Balance Balance Assessed: No Exercise  Other Exercises Other Exercises: pt issued handout for core and LE exercise.  Pt able to demonstrate all exercises End of Session PT - End of Session Equipment Utilized During Treatment: Gait belt Activity Tolerance: Patient limited by fatigue Patient left: in bed;with call bell in reach Nurse Communication: Mobility status for ambulation General Behavior During Session: [facility] for tasks performed Cognition: [facility] for tasks performed  Donnetta Hail 01/19/2012, 3:46 PM

## 2012-01-19 NOTE — Progress Notes (Signed)
UR CHART REVIEWED; B Marbeth Smedley RN, BSN, MHA 

## 2012-01-19 NOTE — Evaluation (Signed)
Occupational Therapy Evaluation Patient Details Name: Andrea [name] MRN: [id number] DOB: Aug 30, 1935 Today's Date: 01/19/2012  Problem List:  Patient Active Problem List  Diagnoses  . DEPRESSION/ANXIETY  . HYPOTENSION  . Edema  . DYSPNEA  . Cough  . CHEST PAIN  . Nonspecific (abnormal) findings on radiological and other examination of body structure  . ABNORMAL LUNG XRAY  . DVT (deep venous thrombosis)  . Encounter for long-term (current) use of anticoagulants  . Falls frequently  . CVD (cerebrovascular disease)  . Hyponatremia  . Obesity, morbid  . Hypothyroidism  . Delirium  . Dyslipidemia  . Lumbar radiculopathy  . Muscle spasm of back  . Visual disturbance  . Headache  . H/O: encephalitis  . Hallucination  . UTI (lower urinary tract infection)    Past Medical History:  Past Medical History  Diagnosis Date  . Hypotension   . Depression with anxiety   . Cough     Wert-onset 08/2009  . Deep venous thrombosis   . Cervicalgia   . Hypothyroidism   . Blood transfusion   . Constipation   . Shortness of breath   . Sleep apnea   . Arthritis   . Anxiety   . Dysrhythmia   . Pneumonia   . Anemia   . Depression   . Lumbar radiculopathy 09/26/2011  . Headache 09/27/2011   Past Surgical History:  Past Surgical History  Procedure Date  . Replacement total knee bilateral   . Appendectomy   . Breast excisional biopsy     breast bxs on left x 2, breast bx of right x 1-all benign  . Laparoscopic ovarian cystectomy     OT Assessment/Plan/Recommendation OT Assessment Clinical Impression Statement: Pt presents w/ h/o falls w/ UTI, her son is moving in with her tomorrow (01/20/12, previously planned) and will be able to provide PRN supervision. She has all necessary DME and A/E, life line and reports no further OT needs but wishes to continue with PT to address gait/balance training. She does not feel as though she needs HHOT at this time either, will therefore sign off. OT  Recommendation/Assessment: Patient does not need any further OT services OT Recommendation Follow Up Recommendations: No OT follow up Equipment Recommended: None recommended by OT   OT Evaluation Precautions/Restrictions  Precautions Precautions: Fall;Other (comment) (DNR) Required Braces or Orthoses: No Restrictions Weight Bearing Restrictions: No Prior Functioning Home Living Lives With: Alone;Other (Comment) (Pt son to move in w/ her 01/20/12) Dolores Lory Help From: Other (Comment) (Son moving in w/ pt 01/20/12; has Life Line) Type of Home: House Home Layout: One level Home Access: Stairs to enter Entrance Stairs-Rails: None Entrance Stairs-Number of Steps: I small Lip/Step Bathroom Shower/Tub: Engineer, manufacturing systems: Standard Bathroom Accessibility: Yes How Accessible: Accessible via walker Home Adaptive Equipment: Bedside commode/3-in-1;Grab bars in shower;Grab bars around toilet;Hand-held shower hose;Reacher;Shower chair with back;Sock aid;Straight cane;Walker - rolling;Walker - four wheeled Prior Function Level of Independence: Independent with basic ADLs;Independent with homemaking with ambulation Driving: Yes Comments: Retired Charity fundraiser ADL ADL Eating/Feeding: Performed;Independent Where Assessed - Eating/Feeding: Edge of bed Grooming: Performed;Wash/dry hands;Wash/dry face Where Assessed - Grooming: Standing at sink;Sitting, chair Upper Body Bathing: Simulated;Modified independent Where Assessed - Upper Body Bathing: Sitting, chair;Sitting, bed Lower Body Bathing: Simulated;Modified independent;Other (comment) (w/ A/E, pt has at home) Where Assessed - Lower Body Bathing: Sitting, chair;Sitting, bed Upper Body Dressing: Simulated;Modified independent Where Assessed - Upper Body Dressing: Sitting, chair;Sitting, bed Lower Body Dressing: Performed;Modified independent;Other (comment) (w/ sock aid seated )  Where Assessed - Lower Body Dressing: Sitting, chair;Sit to stand  from chair Toilet Transfer: Performed;Modified independent Toilet Transfer Details (indicate cue type and reason): Pt w/ h/o multiple falls, however transfers observed were Mod I level during this assessment. Pt's son is moving in with her tomorrow and she should benefit from his PRN supervision in the house b/c of her history Toilet Transfer Method: Proofreader: Comfort height toilet Toileting - Clothing Manipulation: Performed;Modified independent Where Assessed - Toileting Clothing Manipulation: Sit to stand from 3-in-1 or toilet Toileting - Hygiene: Simulated;Modified independent Where Assessed - Toileting Hygiene: Sit on 3-in-1 or toilet Tub/Shower Transfer: Simulated;Supervision/safety;Modified independent Tub/Shower Transfer Method: Science writer: Grab bars Equipment Used: Rolling walker;Other (comment) (pt has sock aid, long handled reacher, 3:1, toilet riser) Ambulation Related to ADLs: Pt w/ h/o multiple falls, however transfers observed were Mod I level during this assessment. Pt's son is moving in with her tomorrow and she should benefit from his PRN supervision in the house b/c of her history ADL Comments: Pt presents w/ h/o falls w/ UTI, her son is moving in with her tomorrow (01/20/12) and will be able to provide PRN supervision. She has all necessary DME and A/E, life line and reports no further OT needs but wishes to continue with PT to address gait/balance training. She does not feel as though she needs HHOT at this time, will therefore sign off. Vision/Perception  Vision - History Baseline Vision: Wears glasses all the time Patient Visual Report: No change from baseline Cognition Cognition Arousal/Alertness: Awake/alert Overall Cognitive Status: Appears within functional limits for tasks assessed Orientation Level: Oriented X4 Sensation/Coordination Sensation Light Touch: Appears Intact Coordination Gross Motor Movements  are Fluid and Coordinated: Yes Fine Motor Movements are Fluid and Coordinated: Yes Extremity Assessment RUE Assessment RUE Assessment: Within Functional Limits LUE Assessment LUE Assessment: Within Functional Limits Mobility  Bed Mobility Bed Mobility: Yes Supine to Sit: 6: Modified independent (Device/Increase time) Transfers Transfers: Yes Sit to Stand: 6: Modified independent (Device/Increase time);From chair/3-in-1;From bed;Without upper extremity assist Stand to Sit: 6: Modified independent (Device/Increase time);To chair/3-in-1;Without upper extremity assist   End of Session OT - End of Session Equipment Utilized During Treatment: Gait belt;Other (comment) (RW; 3:1) Activity Tolerance: Patient tolerated treatment well Patient left: in chair;with call bell in reach General Behavior During Session: [facility] for tasks performed Cognition: [facility] for tasks performed   Alm Bustard 01/19/2012, 11:18 AM

## 2012-01-19 NOTE — Progress Notes (Addendum)
Subjective: Patient denies hallucinations.feeling better. She wasnot ableto sleep last night.  Objective: Filed Vitals:   01/18/12 1628 01/18/12 1914 01/18/12 2135 01/19/12 0526  BP: 135/81 152/92 123/75 152/81  Pulse: 70 75 75 69  Temp: 98.5 F (36.9 C) 98.4 F (36.9 C) 99.3 F (37.4 C) 98.2 F (36.8 C)  TempSrc: Oral Oral Oral Oral  Resp: 18 18 18 16   Height:  5\' 4"  (1.626 m)    Weight:  103.2 kg (227 lb 8.2 oz)    SpO2: 98% 99% 94% 99%   Weight change:   Intake/Output Summary (Last 24 hours) at 01/19/12 1107 Last data filed at 01/19/12 0527  Gross per 24 hour  Intake    180 ml  Output      0 ml  Net    180 ml    General: Alert, awake, oriented x3, in no acute distress.  HEENT: No bruits, no goiter.  Heart: Regular rate and rhythm, without murmurs, rubs, gallops.  Lungs: CTA,, bilateral air movement.  Abdomen: Soft, nontender, nondistended, positive bowel sounds.  Neuro: Grossly intact, nonfocal. Extremities;trace edema, chronic redness.    Lab Results:  [facility] 01/19/12 0020 01/17/12 2030  NA 138 136  K 3.9 3.3*  CL 104 102  CO2 25 25  GLUCOSE 112* 115*  BUN 17 20  CREATININE 0.75 0.76  CALCIUM 8.7 8.7  MG -- --  PHOS -- --    Basename 01/19/12 0020 01/17/12 2030  WBC 6.1 9.5  NEUTROABS -- 7.2  HGB 12.5 12.4  HCT 37.4 36.5  MCV 88.0 86.1  PLT 207 213    Basename 01/17/12 2030  CKTOTAL 3268*  CKMB --  CKMBINDEX --  TROPONINI --    Basename 01/18/12  TSH 2.369  T4TOTAL --  T3FREE --  THYROIDAB --    Micro Results: Recent Results (from the past 240 hour(s))  URINE CULTURE     Status: Normal (Preliminary result)   Collection Time   01/17/12  9:50 PM      Component Value Range Status Comment   Specimen Description URINE, CATHETERIZED   Final    Special Requests NONE   Final    Culture  Setup Time [id number]   Final    Colony Count >=100,000 COLONIES/ML   Final    Culture ENTEROCOCCUS SPECIES   Final    Report Status PENDING    Incomplete   CULTURE, BLOOD (ROUTINE X 2)     Status: Normal (Preliminary result)   Collection Time   01/17/12 11:45 PM      Component Value Range Status Comment   Specimen Description BLOOD LEFT HAND   Final    Special Requests BOTTLES DRAWN AEROBIC ONLY 4CC   Final    Culture  Setup Time [id number]   Final    Culture     Final    Value:        BLOOD CULTURE RECEIVED NO GROWTH TO DATE CULTURE WILL BE HELD FOR 5 DAYS BEFORE ISSUING A FINAL NEGATIVE REPORT   Report Status PENDING   Incomplete   CULTURE, BLOOD (ROUTINE X 2)     Status: Normal (Preliminary result)   Collection Time   01/17/12 11:49 PM      Component Value Range Status Comment   Specimen Description BLOOD LEFT FOREARM   Final    Special Requests BOTTLES DRAWN AEROBIC AND ANAEROBIC [facility]   Final    Culture  Setup Time [id number]   Final    Culture  Final    Value:        BLOOD CULTURE RECEIVED NO GROWTH TO DATE CULTURE WILL BE HELD FOR 5 DAYS BEFORE ISSUING A FINAL NEGATIVE REPORT   Report Status PENDING   Incomplete     Studies/Results: Ct Head Wo Contrast  01/17/2012  *RADIOLOGY REPORT*  Clinical Data:  77 year old female status post fall.  Pain.  CT HEAD WITHOUT CONTRAST CT CERVICAL SPINE WITHOUT CONTRAST  Technique:  Multidetector CT imaging of the head and cervical spine was performed following the standard protocol without intravenous contrast.  Multiplanar CT image reconstructions of the cervical spine were also generated.  Comparison:  Cervical MRI 09/26/2011.  Head CT 09/25/2011.  CT HEAD  Findings: Visualized paranasal sinuses and mastoids are clear.  No acute orbit or scalp soft tissue findings.  Calvarium intact.  Chronic confluent cerebral white matter hypodensity has not significantly changed.  No ventriculomegaly. No midline shift, mass effect, or evidence of mass lesion.  No acute intracranial hemorrhage identified.  No evidence of cortically based acute infarction identified.  No suspicious intracranial  vascular hyperdensity.  IMPRESSION: Stable noncontrast CT of the head with chronic advanced white matter changes.  Cervical findings are below.  CT CERVICAL SPINE  Findings: Chronic straightening of cervical lordosis. Visualized skull base is intact.  No atlanto-occipital dissociation. Cervicothoracic junction alignment is within normal limits. Bilateral posterior element alignment is within normal limits. Chronic cervical facet degeneration at C3-C4 and C4-C5.  Chronic advanced disc degeneration at C5-C6.  No acute cervical fracture. Clear lung apices.  Negative paraspinal soft tissues.  IMPRESSION: No acute fracture or listhesis identified in the cervical spine. Ligamentous injury is not excluded.  Original Report Authenticated By: Harley Hallmark, M.D.   Ct Cervical Spine Wo Contrast  01/17/2012  *RADIOLOGY REPORT*  Clinical Data:  77 year old female status post fall.  Pain.  CT HEAD WITHOUT CONTRAST CT CERVICAL SPINE WITHOUT CONTRAST  Technique:  Multidetector CT imaging of the head and cervical spine was performed following the standard protocol without intravenous contrast.  Multiplanar CT image reconstructions of the cervical spine were also generated.  Comparison:  Cervical MRI 09/26/2011.  Head CT 09/25/2011.  CT HEAD  Findings: Visualized paranasal sinuses and mastoids are clear.  No acute orbit or scalp soft tissue findings.  Calvarium intact.  Chronic confluent cerebral white matter hypodensity has not significantly changed.  No ventriculomegaly. No midline shift, mass effect, or evidence of mass lesion.  No acute intracranial hemorrhage identified.  No evidence of cortically based acute infarction identified.  No suspicious intracranial vascular hyperdensity.  IMPRESSION: Stable noncontrast CT of the head with chronic advanced white matter changes.  Cervical findings are below.  CT CERVICAL SPINE  Findings: Chronic straightening of cervical lordosis. Visualized skull base is intact.  No  atlanto-occipital dissociation. Cervicothoracic junction alignment is within normal limits. Bilateral posterior element alignment is within normal limits. Chronic cervical facet degeneration at C3-C4 and C4-C5.  Chronic advanced disc degeneration at C5-C6.  No acute cervical fracture. Clear lung apices.  Negative paraspinal soft tissues.  IMPRESSION: No acute fracture or listhesis identified in the cervical spine. Ligamentous injury is not excluded.  Original Report Authenticated By: Harley Hallmark, M.D.    Medications: I have reviewed the patient's current medications.  Falls frequently (09/25/2011) PT, OT .  CT head negative.   Hypothyroidism (09/25/2011) Continue with synthroid.   Hallucination (01/18/2012)/ ? Metabolic encephalopathy  In the setting of infection. Resolved.  UTI (lower urinary tract infection) (01/18/2012)  Ceftriaxone. urine culture enterococcus. follow sensitivity.   Depression: continue with Wellbutrin. Restart temazepam. Will ask pharmacy to find name of other antidepressive medication. .        LOS: 2 days   Shaianne Nucci M.D.  [facility] 01/19/2012, 11:07 AM

## 2012-01-20 DIAGNOSIS — E039 Hypothyroidism, unspecified: Secondary | ICD-10-CM | POA: Diagnosis not present

## 2012-01-20 DIAGNOSIS — E782 Mixed hyperlipidemia: Secondary | ICD-10-CM | POA: Diagnosis not present

## 2012-01-20 DIAGNOSIS — R443 Hallucinations, unspecified: Secondary | ICD-10-CM | POA: Diagnosis not present

## 2012-01-20 DIAGNOSIS — R197 Diarrhea, unspecified: Secondary | ICD-10-CM | POA: Diagnosis not present

## 2012-01-20 MED ORDER — ONDANSETRON 4 MG PO TBDP: 4.0000 mg | ORAL_TABLET | Freq: Three times a day (TID) | ORAL | Status: AC | PRN

## 2012-01-20 MED ORDER — AMPICILLIN 500 MG PO CAPS: 500.0000 mg | ORAL_CAPSULE | Freq: Three times a day (TID) | ORAL | Status: AC

## 2012-01-20 NOTE — Progress Notes (Signed)
Pt complained of nausea around 1900 and just vomited at 0000.  VS are BP 152/83 P 73 T 97.5 O2 97 RA and R 20.  Provided cool cloths, a tub to vomit in and gave PO Zofran as ordered.  Pt not in distress.  Environment quiet and dark.  Will continue to monitor pt. Daphene Calamity [place] 12:20 AM 01/20/2012

## 2012-01-20 NOTE — Progress Notes (Signed)
Pt feeling nauseated.  Notified MD.  Received new orders.  Will continue to monitor. Andrea [name] 07:45 01/19/2012

## 2012-01-20 NOTE — Discharge Summary (Signed)
Admit date: 01/17/2012 Discharge date: 01/20/2012  Primary Care Physician:  Karie Chimera, MD, MD   Discharge Diagnoses:   Andrea [name] Metabolic encephalopathy, hallucination.  01/18/2012   . UTI (lower urinary tract infection) 01/18/2012   . Dyslipidemia 09/26/2011   . CVD (cerebrovascular disease) 09/25/2011   . Falls frequently 09/25/2011   . Hypothyroidism 09/25/2011      DISCHARGE MEDICATION: Medication List  As of 01/20/2012  8:26 AM   TAKE these medications         ammonium lactate 12 % lotion   Commonly known as: LAC-HYDRIN   Apply 1 application topically daily as needed. FOR DRY SKIN      ampicillin 500 MG capsule   Commonly known as: PRINCIPEN   Take 1 capsule (500 mg total) by mouth every 8 (eight) hours.      aspirin 81 MG EC tablet   Take 1 tablet (81 mg total) by mouth daily.      buPROPion 300 MG 24 hr tablet   Commonly known as: WELLBUTRIN XL   Take 300 mg by mouth daily.      Calcium 1500 MG tablet   Take 1,500 mg by mouth.      levothyroxine 50 MCG tablet   Commonly known as: SYNTHROID, LEVOTHROID   Take 50 mcg by mouth daily.      multivitamin per tablet   Take 1 tablet by mouth daily.      ondansetron 4 MG disintegrating tablet   Commonly known as: ZOFRAN-ODT   Take 1 tablet (4 mg total) by mouth every 8 (eight) hours as needed.      oxyCODONE-acetaminophen 5-325 MG per tablet   Commonly known as: PERCOCET   Take 1 tablet by mouth every 4 (four) hours as needed. pain      RESTORIL 15 MG capsule   Generic drug: temazepam   Take 15 mg by mouth at bedtime.      triamcinolone cream 0.1 %   Commonly known as: KENALOG   Apply 1 application topically 2 (two) times daily as needed. Dry skin....Andrea KitchenMIXED IN CETAPHIL 1:1 AND THE DIRECTIONS ARE APPLY TO AFFECTED AREAS TWICE A DAY.              Consults:  none   SIGNIFICANT DIAGNOSTIC STUDIES:  Ct Head Wo Contrast  01/17/2012  *RADIOLOGY REPORT*  Clinical Data:  77 year old female status post fall.  Pain.   CT HEAD WITHOUT CONTRAST CT CERVICAL SPINE WITHOUT CONTRAST  Technique:  Multidetector CT imaging of the head and cervical spine was performed following the standard protocol without intravenous contrast.  Multiplanar CT image reconstructions of the cervical spine were also generated.  Comparison:  Cervical MRI 09/26/2011.  Head CT 09/25/2011.  CT HEAD  Findings: Visualized paranasal sinuses and mastoids are clear.  No acute orbit or scalp soft tissue findings.  Calvarium intact.  Chronic confluent cerebral white matter hypodensity has not significantly changed.  No ventriculomegaly. No midline shift, mass effect, or evidence of mass lesion.  No acute intracranial hemorrhage identified.  No evidence of cortically based acute infarction identified.  No suspicious intracranial vascular hyperdensity.  IMPRESSION: Stable noncontrast CT of the head with chronic advanced white matter changes.  Cervical findings are below.  CT CERVICAL SPINE  Findings: Chronic straightening of cervical lordosis. Visualized skull base is intact.  No atlanto-occipital dissociation. Cervicothoracic junction alignment is within normal limits. Bilateral posterior element alignment is within normal limits. Chronic cervical facet degeneration at C3-C4 and C4-C5.  Chronic  advanced disc degeneration at C5-C6.  No acute cervical fracture. Clear lung apices.  Negative paraspinal soft tissues.  IMPRESSION: No acute fracture or listhesis identified in the cervical spine. Ligamentous injury is not excluded.  Original Report Authenticated By: Harley Hallmark, M.D.   Ct Cervical Spine Wo Contrast  01/17/2012  *RADIOLOGY REPORT*  Clinical Data:  77 year old female status post fall.  Pain.  CT HEAD WITHOUT CONTRAST CT CERVICAL SPINE WITHOUT CONTRAST  Technique:  Multidetector CT imaging of the head and cervical spine was performed following the standard protocol without intravenous contrast.  Multiplanar CT image reconstructions of the cervical spine  were also generated.  Comparison:  Cervical MRI 09/26/2011.  Head CT 09/25/2011.  CT HEAD  Findings: Visualized paranasal sinuses and mastoids are clear.  No acute orbit or scalp soft tissue findings.  Calvarium intact.  Chronic confluent cerebral white matter hypodensity has not significantly changed.  No ventriculomegaly. No midline shift, mass effect, or evidence of mass lesion.  No acute intracranial hemorrhage identified.  No evidence of cortically based acute infarction identified.  No suspicious intracranial vascular hyperdensity.  IMPRESSION: Stable noncontrast CT of the head with chronic advanced white matter changes.  Cervical findings are below.  CT CERVICAL SPINE  Findings: Chronic straightening of cervical lordosis. Visualized skull base is intact.  No atlanto-occipital dissociation. Cervicothoracic junction alignment is within normal limits. Bilateral posterior element alignment is within normal limits. Chronic cervical facet degeneration at C3-C4 and C4-C5.  Chronic advanced disc degeneration at C5-C6.  No acute cervical fracture. Clear lung apices.  Negative paraspinal soft tissues.  IMPRESSION: No acute fracture or listhesis identified in the cervical spine. Ligamentous injury is not excluded.  Original Report Authenticated By: Harley Hallmark, M.D.   Mm Digital Screening  01/02/2012  DG SCREEN MAMMOGRAM BILATERAL Bilateral CC and MLO view(s) were taken.  DIGITAL SCREENING MAMMOGRAM WITH CAD: The breast tissue is almost entirely fatty.  A possible mass is noted in the left breast.  Spot  compression views and possibly sonography are recommended for further evaluation.  In the right  breast, no masses or malignant type calcifications are identified.  Images were processed with CAD.  IMPRESSION: Possible mass, left breast.  Additional evaluation is indicated.  The patient will be contacted for additional studies and a supplementary report will follow.  No specific mammographic evidence of   malignancy, right breast.  ASSESSMENT: Need additional imaging evaluation and/or prior mammograms for comparison - BI-RADS 0  Further imaging of the left breast. ,     Recent Results (from the past 240 hour(s))  URINE CULTURE     Status: Normal   Collection Time   01/17/12  9:50 PM      Component Value Range Status Comment   Specimen Description URINE, CATHETERIZED   Final    Special Requests NONE   Final    Culture  Setup Time [id number]   Final    Colony Count >=100,000 COLONIES/ML   Final    Culture ENTEROCOCCUS SPECIES   Final    Report Status 01/19/2012 FINAL   Final    Organism ID, Bacteria ENTEROCOCCUS SPECIES   Final   CULTURE, BLOOD (ROUTINE X 2)     Status: Normal (Preliminary result)   Collection Time   01/17/12 11:45 PM      Component Value Range Status Comment   Specimen Description BLOOD LEFT HAND   Final    Special Requests BOTTLES DRAWN AEROBIC ONLY 4CC   Final  Culture  Setup Time [id number]   Final    Culture     Final    Value:        BLOOD CULTURE RECEIVED NO GROWTH TO DATE CULTURE WILL BE HELD FOR 5 DAYS BEFORE ISSUING A FINAL NEGATIVE REPORT   Report Status PENDING   Incomplete   CULTURE, BLOOD (ROUTINE X 2)     Status: Normal (Preliminary result)   Collection Time   01/17/12 11:49 PM      Component Value Range Status Comment   Specimen Description BLOOD LEFT FOREARM   Final    Special Requests BOTTLES DRAWN AEROBIC AND ANAEROBIC 3CC   Final    Culture  Setup Time [id number]   Final    Culture     Final    Value:        BLOOD CULTURE RECEIVED NO GROWTH TO DATE CULTURE WILL BE HELD FOR 5 DAYS BEFORE ISSUING A FINAL NEGATIVE REPORT   Report Status PENDING   Incomplete     BRIEF ADMITTING H & P: This is a pleasant 77 year old female who started having hallucinations and falling since yesterday. Yesterday she fell getting the mail and again twice today. She was too weak to get up. She did hit her head on the concrete yesterday, but reports no loss of  consciousness. The patient does have a tendency to fall, however, is been worse over the past 2 days. Additionally she hass developing hallucinations, both visual and auditory. She does report a burning with urination and a low-grade fever. She reports no chest pains or palpitations.  Of note, patient has had these symptoms on and off for the past 2 years, usually associated with urinary tract infections. She did have a traumatic brain injury several years ago. She has seen multiple specialists and workup, with no revealing findings. Current been the patient has hallucinations, she doesn't appear to be very confused. She is one was provided all the history. No family members are present at the bedside. Patient lives alone, but her symptoms over the weekend.   Hospital Course:  UTI (lower urinary tract infection) (01/18/2012) Patient was treated initially with Ceftriaxone. Urine grew enterococcus 100,000 colonies sensitive to ampicillin. She will be discharge on 5 more days of antibiotics.   Falls frequently (09/25/2011) This was thought to be secondary to deconditioning, due to infection.  CT head negative for acute stroke. Per patient she has had extensive work up out patient. PT out patient.   Hypothyroidism (09/25/2011) Continue with synthroid.   Hallucination (01/18/2012)/ Metabolic encephalopathy  In the setting of infection. Resolved.   Depression: continue with Wellbutrin. Restart temazepam.    Patient vomit once overnight, after taking oral antibiotics with empty stomach. If she tolerates diet will discharge later today.     Disposition and Follow-up:  Discharge Orders    Future Appointments: Provider: Department: Dept Phone: Center:   01/23/2012 2:20 PM [facility]o [facility] Mammography [phone] [facility]   01/23/2012 2:30 PM [facility] [place] [facility] Ultrasound [phone] [facility]     Future Orders Please Complete By Expires   Diet - low sodium heart healthy       Increase activity slowly        Follow-up Information    Follow up with REESE,BETTI D, MD .          DISCHARGE EXAM:  General: Alert, awake, oriented x3, in no acute distress.  HEENT: No bruits, no goiter.  Heart: Regular  rate and rhythm, without murmurs, rubs, gallops.  Lungs: CTA,, bilateral air movement.  Abdomen: Soft, nontender, nondistended, positive bowel sounds.  Neuro: Grossly intact, nonfocal.  Extremities;trace edema, chronic redness.    Blood pressure 152/84, pulse 73, temperature 98.3 F (36.8 C), temperature source Oral, resp. rate 18, height 5\' 4"  (1.626 m), weight 103.2 kg (227 lb 8.2 oz), SpO2 99.00%.   Basename 01/19/12 0020 01/17/12 2030  NA 138 136  K 3.9 3.3*  CL 104 102  CO2 25 25  GLUCOSE 112* 115*  BUN 17 20  CREATININE 0.75 0.76  CALCIUM 8.7 8.7  MG -- --  PHOS -- --    Basename 01/19/12 0020 01/17/12 2030  WBC 6.1 9.5  NEUTROABS -- 7.2  HGB 12.5 12.4  HCT 37.4 36.5  MCV 88.0 86.1  PLT 207 213    Signed: Reve Crocket M.D. 01/20/2012, 8:26 AM

## 2012-01-22 DIAGNOSIS — N390 Urinary tract infection, site not specified: Secondary | ICD-10-CM | POA: Diagnosis not present

## 2012-01-22 DIAGNOSIS — R700 Elevated erythrocyte sedimentation rate: Secondary | ICD-10-CM | POA: Diagnosis not present

## 2012-01-23 ENCOUNTER — Ambulatory Visit
Admission: RE | Admit: 2012-01-23 | Discharge: 2012-01-23 | Disposition: A | Discharge status: Home / Self Care | Payer: Medicare Other | Source: Ambulatory Visit | Attending: Family Medicine | Admitting: Family Medicine

## 2012-01-23 DIAGNOSIS — R928 Other abnormal and inconclusive findings on diagnostic imaging of breast: Secondary | ICD-10-CM

## 2012-01-23 DIAGNOSIS — N6489 Other specified disorders of breast: Secondary | ICD-10-CM | POA: Diagnosis not present

## 2012-01-24 LAB — CULTURE, BLOOD (ROUTINE X 2)
Culture  Setup Time: 201302280414
Culture  Setup Time: 201302280414
Culture: NO GROWTH

## 2012-01-26 DIAGNOSIS — F325 Major depressive disorder, single episode, in full remission: Secondary | ICD-10-CM | POA: Diagnosis not present

## 2012-02-01 DIAGNOSIS — S8000XA Contusion of unspecified knee, initial encounter: Secondary | ICD-10-CM | POA: Diagnosis not present

## 2012-02-01 DIAGNOSIS — M545 Low back pain: Secondary | ICD-10-CM | POA: Diagnosis not present

## 2012-02-06 DIAGNOSIS — S8000XA Contusion of unspecified knee, initial encounter: Secondary | ICD-10-CM | POA: Diagnosis not present

## 2012-02-06 DIAGNOSIS — M545 Low back pain: Secondary | ICD-10-CM | POA: Diagnosis not present

## 2012-02-13 DIAGNOSIS — I10 Essential (primary) hypertension: Secondary | ICD-10-CM | POA: Diagnosis not present

## 2012-02-13 DIAGNOSIS — N390 Urinary tract infection, site not specified: Secondary | ICD-10-CM | POA: Diagnosis not present

## 2012-02-13 DIAGNOSIS — R4182 Altered mental status, unspecified: Secondary | ICD-10-CM | POA: Diagnosis not present

## 2012-02-14 DIAGNOSIS — M545 Low back pain: Secondary | ICD-10-CM | POA: Diagnosis not present

## 2012-02-20 DIAGNOSIS — M545 Low back pain: Secondary | ICD-10-CM | POA: Diagnosis not present

## 2012-02-22 DIAGNOSIS — M545 Low back pain: Secondary | ICD-10-CM | POA: Diagnosis not present

## 2012-02-26 DIAGNOSIS — M545 Low back pain: Secondary | ICD-10-CM | POA: Diagnosis not present

## 2012-02-28 DIAGNOSIS — M545 Low back pain: Secondary | ICD-10-CM | POA: Diagnosis not present

## 2012-02-28 DIAGNOSIS — S8000XA Contusion of unspecified knee, initial encounter: Secondary | ICD-10-CM | POA: Diagnosis not present

## 2012-02-28 DIAGNOSIS — R269 Unspecified abnormalities of gait and mobility: Secondary | ICD-10-CM | POA: Diagnosis not present

## 2012-03-01 ENCOUNTER — Emergency Department (HOSPITAL_COMMUNITY): Payer: Medicare Other

## 2012-03-01 ENCOUNTER — Encounter (HOSPITAL_COMMUNITY): Payer: Self-pay | Admitting: *Deleted

## 2012-03-01 ENCOUNTER — Emergency Department (HOSPITAL_COMMUNITY)
Admission: EM | Admit: 2012-03-01 | Discharge: 2012-03-01 | Disposition: A | Discharge status: Home / Self Care | Payer: Medicare Other | Attending: Emergency Medicine | Admitting: Emergency Medicine

## 2012-03-01 DIAGNOSIS — Z79899 Other long term (current) drug therapy: Secondary | ICD-10-CM | POA: Diagnosis not present

## 2012-03-01 DIAGNOSIS — S0990XA Unspecified injury of head, initial encounter: Secondary | ICD-10-CM | POA: Diagnosis not present

## 2012-03-01 DIAGNOSIS — S50319A Abrasion of unspecified elbow, initial encounter: Secondary | ICD-10-CM

## 2012-03-01 DIAGNOSIS — Z86718 Personal history of other venous thrombosis and embolism: Secondary | ICD-10-CM | POA: Insufficient documentation

## 2012-03-01 DIAGNOSIS — F341 Dysthymic disorder: Secondary | ICD-10-CM | POA: Diagnosis not present

## 2012-03-01 DIAGNOSIS — S0083XA Contusion of other part of head, initial encounter: Secondary | ICD-10-CM | POA: Diagnosis not present

## 2012-03-01 DIAGNOSIS — T148XXA Other injury of unspecified body region, initial encounter: Secondary | ICD-10-CM | POA: Diagnosis not present

## 2012-03-01 DIAGNOSIS — W19XXXA Unspecified fall, initial encounter: Secondary | ICD-10-CM | POA: Insufficient documentation

## 2012-03-01 DIAGNOSIS — IMO0002 Reserved for concepts with insufficient information to code with codable children: Secondary | ICD-10-CM | POA: Insufficient documentation

## 2012-03-01 DIAGNOSIS — M129 Arthropathy, unspecified: Secondary | ICD-10-CM | POA: Insufficient documentation

## 2012-03-01 DIAGNOSIS — G319 Degenerative disease of nervous system, unspecified: Secondary | ICD-10-CM | POA: Diagnosis not present

## 2012-03-01 DIAGNOSIS — S1093XA Contusion of unspecified part of neck, initial encounter: Secondary | ICD-10-CM | POA: Diagnosis not present

## 2012-03-01 DIAGNOSIS — E039 Hypothyroidism, unspecified: Secondary | ICD-10-CM | POA: Diagnosis not present

## 2012-03-01 DIAGNOSIS — Z7982 Long term (current) use of aspirin: Secondary | ICD-10-CM | POA: Insufficient documentation

## 2012-03-01 DIAGNOSIS — S0003XA Contusion of scalp, initial encounter: Secondary | ICD-10-CM | POA: Diagnosis not present

## 2012-03-01 MED ORDER — OXYCODONE-ACETAMINOPHEN 5-325 MG PO TABS
1.0000 | ORAL_TABLET | Freq: Once | ORAL | Status: AC
  Administered 2012-03-01: 1 via ORAL
  Filled 2012-03-01: qty 1

## 2012-03-01 NOTE — ED Notes (Signed)
Bed:WHALA<BR> Expected date:<BR> Expected time: 6:26 PM<BR> Means of arrival:<BR> Comments:<BR> M31 - 86yoF Fall, hematoma to head, no blood thinners

## 2012-03-01 NOTE — ED Notes (Signed)
Per EMS- pt in s/p fall after tripping on curb, pt hit head, denies LOC, swelling and bruising noted to right occipital lobe, no bleeding noted, also abrasion to left elbow, denies other injuries, pt with c-collar in place on LSB

## 2012-03-01 NOTE — ED Notes (Signed)
Pt in CT.

## 2012-03-01 NOTE — Discharge Instructions (Signed)
Abrasions Abrasions are skin scrapes. Their treatment depends on how large and deep the abrasion is. Abrasions do not extend through all layers of the skin. A cut or lesion through all skin layers is called a laceration. HOME CARE INSTRUCTIONS   If you were given a dressing, change it at least once a day or as instructed by your caregiver. If the bandage sticks, soak it off with a solution of water or hydrogen peroxide.   Twice a day, wash the area with soap and water to remove all the cream/ointment. You may do this in a sink, under a tub faucet, or in a shower. Rinse off the soap and pat dry with a clean towel. Look for signs of infection (see below).   Reapply cream/ointment according to your caregiver's instruction. This will help prevent infection and keep the bandage from sticking. Telfa or gauze over the wound and under the dressing or wrap will also help keep the bandage from sticking.   If the bandage becomes wet, dirty, or develops a foul smell, change it as soon as possible.   Only take over-the-counter or prescription medicines for pain, discomfort, or fever as directed by your caregiver.  SEEK IMMEDIATE MEDICAL CARE IF:   Increasing pain in the wound.   Signs of infection develop: redness, swelling, surrounding area is tender to touch, or pus coming from the wound.   You have a fever.   Any foul smell coming from the wound or dressing.  Most skin wounds heal within ten days. Facial wounds heal faster. However, an infection may occur despite proper treatment. You should have the wound checked for signs of infection within 24 to 48 hours or sooner if problems arise. If you were not given a wound-check appointment, look closely at the wound yourself on the second day for early signs of infection listed above. MAKE SURE YOU:   Understand these instructions.   Will watch your condition.   Will get help right away if you are not doing well or get worse.  Document Released:  08/16/2005 Document Revised: 10/26/2011 Document Reviewed: 10/10/2011 [facility] Patient Information 2012 [place].Blunt Trauma You have been evaluated for injuries. You have been examined and your caregiver has not found injuries serious enough to require hospitalization. It is common to have multiple bruises and sore muscles following an accident. These tend to feel worse for the first 24 hours. You will feel more stiffness and soreness over the next several hours and worse when you wake up the first morning after your accident. After this point, you should begin to improve with each passing day. The amount of improvement depends on the amount of damage done in the accident. Following your accident, if some part of your body does not work as it should, or if the pain in any area continues to increase, you should return to the Emergency Department for re-evaluation.  HOME CARE INSTRUCTIONS  Routine care for sore areas should include:  Ice to sore areas every 2 hours for 20 minutes while awake for the next 2 days.   Drink extra fluids (not alcohol).   Take a hot or warm shower or bath once or twice a day to increase blood flow to sore muscles. This will help you "limber up".   Activity as tolerated. Lifting may aggravate neck or back pain.   Only take over-the-counter or prescription medicines for pain, discomfort, or fever as directed by your caregiver. Do not use aspirin. This may increase bruising or  increase bleeding if there are small areas where this is happening.  SEEK IMMEDIATE MEDICAL CARE IF:  Numbness, tingling, weakness, or problem with the use of your arms or legs.   A severe headache is not relieved with medications.   There is a change in bowel or bladder control.   Increasing pain in any areas of the body.   Short of breath or dizzy.   Nauseated, vomiting, or sweating.   Increasing belly (abdominal) discomfort.   Blood in urine, stool, or vomiting blood.   Pain  in either shoulder in an area where a shoulder strap would be.   Feelings of lightheadedness or if you have a fainting episode.  Sometimes it is not possible to identify all injuries immediately after the trauma. It is important that you continue to monitor your condition after the emergency department visit. If you feel you are not improving, or improving more slowly than should be expected, call your physician. If you feel your symptoms (problems) are worsening, return to the Emergency Department immediately. Document Released: 08/02/2001 Document Revised: 10/26/2011 Document Reviewed: 06/24/2008 [facility] Patient Information 2012 [place], [place].Head Injury, Adult A head injury happens when the head is hit really hard. A head injury may cause sleepiness, headache, throwing up (vomiting), and problems seeing. If the head injury is really bad, you may need to stay in the hospital. HOME CARE  Have someone with you for the first 24 hours. This person should wake you up every 1 hour to check on your condition.   Only drink water or clear fluid for the rest of the day. Then, go back to your regular diet.   Only take medicines as told by your doctor. Do not take aspirin.   Do not drink alcohol for 2 days.   Do not take medicines that help your relax (sedatives) for 2 days.  Side effects may happen for up to 7 to 10 days. Watch for new problems. GET HELP RIGHT AWAY IF:   You are confused or sleepy.   You cannot be woken up.   You feel sick to your stomach (nauseous) or keep throwing up.   Your dizziness or unsteadiness is get worse, or your cannot walk.   You start to shake (convulse) or pass out (faint).   You have very bad, lasting headaches that are not helped by medicine.   You cannot use your arms or legs like normal.   You have clear or bloody fluid coming from your nose or ears.  MAKE SURE YOU:   Understand these instructions.   Will watch your condition.   Will get help  right away if you are not doing well or get worse.  Document Released: 10/19/2008 Document Revised: 10/26/2011 Document Reviewed: 09/22/2009 [facility] Patient Information 2012 [place], [place].

## 2012-03-04 DIAGNOSIS — B351 Tinea unguium: Secondary | ICD-10-CM | POA: Diagnosis not present

## 2012-03-04 DIAGNOSIS — M79609 Pain in unspecified limb: Secondary | ICD-10-CM | POA: Diagnosis not present

## 2012-03-11 DIAGNOSIS — R413 Other amnesia: Secondary | ICD-10-CM | POA: Diagnosis not present

## 2012-03-11 DIAGNOSIS — R42 Dizziness and giddiness: Secondary | ICD-10-CM | POA: Diagnosis not present

## 2012-03-11 DIAGNOSIS — I10 Essential (primary) hypertension: Secondary | ICD-10-CM | POA: Diagnosis not present

## 2012-03-12 NOTE — ED Provider Notes (Signed)
History    77 year old female presenting after a fall. Patient states she tripped although never occur. Did strike her head. No loss of consciousness. No acute visual changes. No acute numbness, tingling or loss of strength. No nausea or vomiting. Denies use of blood thinning medication. Denies chest pain or shortness of breath. No abdominal pain.  CSN: [id number]  Arrival date & time 03/01/12  1830   First MD Initiated Contact with Patient 03/01/12 1840      Chief Complaint  Patient presents with  . Fall  . Head Injury    (Consider location/radiation/quality/duration/timing/severity/associated sxs/prior treatment) HPI  Past Medical History  Diagnosis Date  . Hypotension   . Depression with anxiety   . Cough     Wert-onset 08/2009  . Deep venous thrombosis   . Cervicalgia   . Hypothyroidism   . Blood transfusion   . Constipation   . Shortness of breath   . Sleep apnea   . Arthritis   . Anxiety   . Dysrhythmia   . Pneumonia   . Anemia   . Depression   . Lumbar radiculopathy 09/26/2011  . Headache 09/27/2011    Past Surgical History  Procedure Date  . Replacement total knee bilateral   . Appendectomy   . Breast excisional biopsy     breast bxs on left x 2, breast bx of right x 1-all benign  . Laparoscopic ovarian cystectomy     Family History  Problem Relation Age of Onset  . Heart disease Father   . Malignant hyperthermia Father   . Deep vein thrombosis Son     History  Substance Use Topics  . Smoking status: Former Smoker -- 0.5 packs/day    Types: Cigarettes    Quit date: 11/20/1968  . Smokeless tobacco: Never Used   Comment: smoked for approx. 10 years  . Alcohol Use: 0.6 oz/week    1 Glasses of wine per week     rare    OB History    Grav Para Term Preterm Abortions TAB SAB Ect Mult Living                  Review of Systems   Review of symptoms negative unless otherwise noted in HPI.   Allergies  Clindamycin hcl  Home Medications    Current Outpatient Rx  Name Route Sig Dispense Refill  . ASPIRIN 81 MG PO TBEC Oral Take 1 tablet (81 mg total) by mouth daily.    . BUPROPION HCL ER (XL) 300 MG PO TB24 Oral Take 300 mg by mouth daily.     [name] CALCIUM 1500 MG PO TABS Oral Take 1,500 mg by mouth.      [name] LEVOTHYROXINE SODIUM 50 MCG PO TABS Oral Take 50 mcg by mouth daily.     [name] LORAZEPAM 1 MG PO TABS Oral Take 2 mg by mouth at bedtime.    . MULTIVITAMINS PO TABS Oral Take 1 tablet by mouth daily.     . OXYCODONE-ACETAMINOPHEN 5-325 MG PO TABS Oral Take 1 tablet by mouth every 4 (four) hours as needed. pain    . VENLAFAXINE HCL 37.5 MG PO TABS Oral Take 75 mg by mouth daily.      BP 135/69  Pulse 91  Temp(Src) 98.6 F (37 C) (Oral)  Resp 20  SpO2 97%  Physical Exam  Nursing note and vitals reviewed. Constitutional: She is oriented to person, place, and time. She appears well-developed and well-nourished. No distress.  HENT:  Head: Normocephalic.  Right Ear: External ear normal.  Left Ear: External ear normal.       Posterior scalp hematoma. Skin is intact.  Eyes: Conjunctivae are normal. Right eye exhibits no discharge. Left eye exhibits no discharge.  Neck: Neck supple.  Cardiovascular: Normal rate, regular rhythm and normal heart sounds.  Exam reveals no gallop and no friction rub.   No murmur heard. Pulmonary/Chest: Effort normal and breath sounds normal. No respiratory distress.  Abdominal: Soft. She exhibits no distension. There is no tenderness.  Musculoskeletal: She exhibits no edema and no tenderness.       No midline spinal tenderness. Small abrasion to left elbow without significant bony tenderness. No significant pain range of motion. Neurovascular intact distally.  Neurological: She is alert and oriented to person, place, and time. No cranial nerve deficit. She exhibits normal muscle tone. Coordination normal.       Good finger to nose testing bilaterally.  Skin: Skin is warm and dry.  Psychiatric:  She has a normal mood and affect. Her behavior is normal. Thought content normal.    ED Course  Procedures (including critical care time)  Labs Reviewed - No data to display No results found.  Ct Head Wo Contrast  03/01/2012  *RADIOLOGY REPORT*  Clinical Data:  Fall, head injury  CT HEAD WITHOUT CONTRAST CT CERVICAL SPINE WITHOUT CONTRAST  Technique:  Multidetector CT imaging of the head and cervical spine was performed following the standard protocol without intravenous contrast.  Multiplanar CT image reconstructions of the cervical spine were also generated.  Comparison:  Head CT to 27,013  CT HEAD  Findings: There is a scalp hematoma over the high right parietal bone.  No associate skull fracture.  No skull base fracture.  There is no intracranial hemorrhage.  No parenchymal contusion.  No midline shift or mass effect.  No hydrocephalus.  There is extensive periventricular white matter hypodensities which are not changed from prior.  IMPRESSION:  1.  No intracranial hemorrhage. 2.  Scalp hematoma. 3.  Atrophy and microvascular disease are similar to prior.  CT CERVICAL SPINE  Findings: No prevertebral soft tissue thickening.  There straightened normal cervical lordosis.  No acute loss of vertebral body height or disc height.  There is disc space narrowing endplate osteophytosis from C5-C7.  Normal facet accumulation.  Normal craniocervical junction.  No evidence of epidural paraspinal hematoma.  IMPRESSION: No evidence of cervical spine fracture.  Multilevel spondylosis.  Original Report Authenticated By: Genevive Bi, M.D.   Ct Cervical Spine Wo Contrast  03/01/2012  *RADIOLOGY REPORT*  Clinical Data:  Fall, head injury  CT HEAD WITHOUT CONTRAST CT CERVICAL SPINE WITHOUT CONTRAST  Technique:  Multidetector CT imaging of the head and cervical spine was performed following the standard protocol without intravenous contrast.  Multiplanar CT image reconstructions of the cervical spine were also  generated.  Comparison:  Head CT to 27,013  CT HEAD  Findings: There is a scalp hematoma over the high right parietal bone.  No associate skull fracture.  No skull base fracture.  There is no intracranial hemorrhage.  No parenchymal contusion.  No midline shift or mass effect.  No hydrocephalus.  There is extensive periventricular white matter hypodensities which are not changed from prior.  IMPRESSION:  1.  No intracranial hemorrhage. 2.  Scalp hematoma. 3.  Atrophy and microvascular disease are similar to prior.  CT CERVICAL SPINE  Findings: No prevertebral soft tissue thickening.  There straightened normal cervical lordosis.  No  acute loss of vertebral body height or disc height.  There is disc space narrowing endplate osteophytosis from C5-C7.  Normal facet accumulation.  Normal craniocervical junction.  No evidence of epidural paraspinal hematoma.  IMPRESSION: No evidence of cervical spine fracture.  Multilevel spondylosis.  Original Report Authenticated By: Genevive Bi, M.D.   1. Fall   2. Closed head injury   3. Scalp hematoma   4. Elbow abrasion       MDM  77 year old female presenting with a closed head injury. This is mechanical fall and no history to suggest syncope. Neuroimaging negative for acute serious traumatic injury. Patient has a nonfocal neurological examination. No significant use of blood thinning medication. Patient was observed for several hours in the emergency room without saturation of her status. For that she can be a probably discharge at this time. Return precautions were discussed. Outpatient followup as needed.        Raeford Razor, MD 03/12/12 [phone]

## 2012-03-15 DIAGNOSIS — F325 Major depressive disorder, single episode, in full remission: Secondary | ICD-10-CM | POA: Diagnosis not present

## 2012-03-18 ENCOUNTER — Other Ambulatory Visit (HOSPITAL_COMMUNITY): Payer: Self-pay | Admitting: Family Medicine

## 2012-03-18 DIAGNOSIS — R569 Unspecified convulsions: Secondary | ICD-10-CM

## 2012-03-20 ENCOUNTER — Ambulatory Visit (HOSPITAL_COMMUNITY)
Admission: RE | Admit: 2012-03-20 | Discharge: 2012-03-20 | Disposition: A | Discharge status: Home / Self Care | Payer: Medicare Other | Source: Ambulatory Visit | Attending: Family Medicine | Admitting: Family Medicine

## 2012-03-20 DIAGNOSIS — Z9181 History of falling: Secondary | ICD-10-CM | POA: Diagnosis not present

## 2012-03-20 DIAGNOSIS — R569 Unspecified convulsions: Secondary | ICD-10-CM

## 2012-03-20 NOTE — Procedures (Signed)
EEG NUMBER:  [id number]  This routine EEG was requested on this 77 year old woman who is complaining of frequent falls.  There is no loss of consciousness, dizziness, or warning.  The patient just falls.  This has been going on for approximately 3 years.  Medications include bupropion, venlafaxine, levothyroxine, and Restoril.  The EEG was done with the patient awake and drowsy.  During periods of maximal wakefulness, she had a moderately regulated, moderately sustained low-amplitude, 10-11 cycles per second posterior dominant rhythm that attenuated with eye opening and was symmetric.  Background activities were composed of low-amplitude beta activities that had frontal predominance and were symmetric.  Photic stimulation produced a low-amplitude symmetric driving response. Hyperventilation for 3 minutes with good effort did not markedly change the tracing.  The patient did become drowsy as characterized by the onset of slow roving eye movements, attenuation of muscle activities, as well as attenuation of the alpha rhythm and the appearance of slower theta activities.  There was also an increase in the amplitude of centrally dominant beta activities.  CLINICAL INTERPRETATION:  This routine EEG done with the patient awake and drowsy is normal.          ______________________________ Denton Meek, MD    EA:[id number] D:  03/20/2012 21:06:47  T:  03/20/2012 21:18:57  Job #:  [id number]

## 2012-03-21 ENCOUNTER — Ambulatory Visit (HOSPITAL_COMMUNITY): Payer: Medicare Other

## 2012-04-30 DIAGNOSIS — F411 Generalized anxiety disorder: Secondary | ICD-10-CM | POA: Diagnosis not present

## 2012-04-30 DIAGNOSIS — M545 Low back pain: Secondary | ICD-10-CM | POA: Diagnosis not present

## 2012-06-20 DIAGNOSIS — H524 Presbyopia: Secondary | ICD-10-CM | POA: Diagnosis not present

## 2012-06-20 DIAGNOSIS — H52209 Unspecified astigmatism, unspecified eye: Secondary | ICD-10-CM | POA: Diagnosis not present

## 2012-06-20 DIAGNOSIS — H5210 Myopia, unspecified eye: Secondary | ICD-10-CM | POA: Diagnosis not present

## 2012-06-20 DIAGNOSIS — H353190 Nonexudative age-related macular degeneration, unspecified eye, stage unspecified: Secondary | ICD-10-CM | POA: Diagnosis not present

## 2012-06-26 DIAGNOSIS — F325 Major depressive disorder, single episode, in full remission: Secondary | ICD-10-CM | POA: Diagnosis not present

## 2012-07-15 DIAGNOSIS — M25549 Pain in joints of unspecified hand: Secondary | ICD-10-CM | POA: Diagnosis not present

## 2012-07-15 DIAGNOSIS — M2550 Pain in unspecified joint: Secondary | ICD-10-CM | POA: Diagnosis not present

## 2012-07-16 ENCOUNTER — Other Ambulatory Visit: Payer: Self-pay | Admitting: Family Medicine

## 2012-07-16 ENCOUNTER — Ambulatory Visit
Admission: RE | Admit: 2012-07-16 | Discharge: 2012-07-16 | Disposition: A | Discharge status: Home / Self Care | Payer: Medicare Other | Source: Ambulatory Visit | Attending: Family Medicine | Admitting: Family Medicine

## 2012-07-16 DIAGNOSIS — M79609 Pain in unspecified limb: Secondary | ICD-10-CM | POA: Diagnosis not present

## 2012-07-16 DIAGNOSIS — W19XXXA Unspecified fall, initial encounter: Secondary | ICD-10-CM

## 2012-07-16 DIAGNOSIS — IMO0002 Reserved for concepts with insufficient information to code with codable children: Secondary | ICD-10-CM | POA: Diagnosis not present

## 2012-07-30 DIAGNOSIS — M25549 Pain in joints of unspecified hand: Secondary | ICD-10-CM | POA: Diagnosis not present

## 2012-08-13 DIAGNOSIS — M25549 Pain in joints of unspecified hand: Secondary | ICD-10-CM | POA: Diagnosis not present

## 2012-08-23 DIAGNOSIS — Z23 Encounter for immunization: Secondary | ICD-10-CM | POA: Diagnosis not present

## 2012-08-30 DIAGNOSIS — R52 Pain, unspecified: Secondary | ICD-10-CM | POA: Diagnosis not present

## 2012-08-30 DIAGNOSIS — F411 Generalized anxiety disorder: Secondary | ICD-10-CM | POA: Diagnosis not present

## 2012-08-30 DIAGNOSIS — I10 Essential (primary) hypertension: Secondary | ICD-10-CM | POA: Diagnosis not present

## 2012-09-03 DIAGNOSIS — M25549 Pain in joints of unspecified hand: Secondary | ICD-10-CM | POA: Diagnosis not present

## 2012-09-12 DIAGNOSIS — IMO0002 Reserved for concepts with insufficient information to code with codable children: Secondary | ICD-10-CM | POA: Diagnosis not present

## 2012-09-17 DIAGNOSIS — M25549 Pain in joints of unspecified hand: Secondary | ICD-10-CM | POA: Diagnosis not present

## 2012-09-17 DIAGNOSIS — IMO0002 Reserved for concepts with insufficient information to code with codable children: Secondary | ICD-10-CM | POA: Diagnosis not present

## 2012-09-24 DIAGNOSIS — IMO0002 Reserved for concepts with insufficient information to code with codable children: Secondary | ICD-10-CM | POA: Diagnosis not present

## 2012-09-27 DIAGNOSIS — IMO0002 Reserved for concepts with insufficient information to code with codable children: Secondary | ICD-10-CM | POA: Diagnosis not present

## 2012-10-01 DIAGNOSIS — IMO0002 Reserved for concepts with insufficient information to code with codable children: Secondary | ICD-10-CM | POA: Diagnosis not present

## 2012-10-01 DIAGNOSIS — M25549 Pain in joints of unspecified hand: Secondary | ICD-10-CM | POA: Diagnosis not present

## 2012-10-04 DIAGNOSIS — IMO0002 Reserved for concepts with insufficient information to code with codable children: Secondary | ICD-10-CM | POA: Diagnosis not present

## 2012-10-04 DIAGNOSIS — M25549 Pain in joints of unspecified hand: Secondary | ICD-10-CM | POA: Diagnosis not present

## 2012-10-08 DIAGNOSIS — IMO0002 Reserved for concepts with insufficient information to code with codable children: Secondary | ICD-10-CM | POA: Diagnosis not present

## 2012-10-08 DIAGNOSIS — N390 Urinary tract infection, site not specified: Secondary | ICD-10-CM | POA: Diagnosis not present

## 2012-10-08 DIAGNOSIS — M25549 Pain in joints of unspecified hand: Secondary | ICD-10-CM | POA: Diagnosis not present

## 2012-10-11 DIAGNOSIS — IMO0002 Reserved for concepts with insufficient information to code with codable children: Secondary | ICD-10-CM | POA: Diagnosis not present

## 2012-10-11 DIAGNOSIS — M25549 Pain in joints of unspecified hand: Secondary | ICD-10-CM | POA: Diagnosis not present

## 2012-10-21 DIAGNOSIS — F325 Major depressive disorder, single episode, in full remission: Secondary | ICD-10-CM | POA: Diagnosis not present

## 2012-10-24 DIAGNOSIS — R5383 Other fatigue: Secondary | ICD-10-CM | POA: Diagnosis not present

## 2012-10-24 DIAGNOSIS — R269 Unspecified abnormalities of gait and mobility: Secondary | ICD-10-CM | POA: Diagnosis not present

## 2012-10-24 DIAGNOSIS — R350 Frequency of micturition: Secondary | ICD-10-CM | POA: Diagnosis not present

## 2012-10-24 DIAGNOSIS — R5381 Other malaise: Secondary | ICD-10-CM | POA: Diagnosis not present

## 2012-11-16 DIAGNOSIS — N390 Urinary tract infection, site not specified: Secondary | ICD-10-CM | POA: Diagnosis not present

## 2012-12-03 DIAGNOSIS — L608 Other nail disorders: Secondary | ICD-10-CM | POA: Diagnosis not present

## 2012-12-03 DIAGNOSIS — M2040 Other hammer toe(s) (acquired), unspecified foot: Secondary | ICD-10-CM | POA: Diagnosis not present

## 2012-12-03 DIAGNOSIS — L84 Corns and callosities: Secondary | ICD-10-CM | POA: Diagnosis not present

## 2012-12-03 DIAGNOSIS — I739 Peripheral vascular disease, unspecified: Secondary | ICD-10-CM | POA: Diagnosis not present

## 2012-12-05 DIAGNOSIS — H353190 Nonexudative age-related macular degeneration, unspecified eye, stage unspecified: Secondary | ICD-10-CM | POA: Diagnosis not present

## 2012-12-05 DIAGNOSIS — IMO0002 Reserved for concepts with insufficient information to code with codable children: Secondary | ICD-10-CM | POA: Diagnosis not present

## 2012-12-05 DIAGNOSIS — M25549 Pain in joints of unspecified hand: Secondary | ICD-10-CM | POA: Diagnosis not present

## 2012-12-09 DIAGNOSIS — M25549 Pain in joints of unspecified hand: Secondary | ICD-10-CM | POA: Diagnosis not present

## 2012-12-09 DIAGNOSIS — IMO0002 Reserved for concepts with insufficient information to code with codable children: Secondary | ICD-10-CM | POA: Diagnosis not present

## 2012-12-13 DIAGNOSIS — M545 Low back pain: Secondary | ICD-10-CM | POA: Diagnosis not present

## 2012-12-13 DIAGNOSIS — F411 Generalized anxiety disorder: Secondary | ICD-10-CM | POA: Diagnosis not present

## 2012-12-13 DIAGNOSIS — M2550 Pain in unspecified joint: Secondary | ICD-10-CM | POA: Diagnosis not present

## 2012-12-16 DIAGNOSIS — IMO0002 Reserved for concepts with insufficient information to code with codable children: Secondary | ICD-10-CM | POA: Diagnosis not present

## 2012-12-16 DIAGNOSIS — M25549 Pain in joints of unspecified hand: Secondary | ICD-10-CM | POA: Diagnosis not present

## 2013-01-07 DIAGNOSIS — M25549 Pain in joints of unspecified hand: Secondary | ICD-10-CM | POA: Diagnosis not present

## 2013-01-07 DIAGNOSIS — IMO0002 Reserved for concepts with insufficient information to code with codable children: Secondary | ICD-10-CM | POA: Diagnosis not present

## 2013-01-17 DIAGNOSIS — S61209A Unspecified open wound of unspecified finger without damage to nail, initial encounter: Secondary | ICD-10-CM | POA: Diagnosis not present

## 2013-01-31 DIAGNOSIS — IMO0002 Reserved for concepts with insufficient information to code with codable children: Secondary | ICD-10-CM | POA: Diagnosis not present

## 2013-01-31 DIAGNOSIS — M25549 Pain in joints of unspecified hand: Secondary | ICD-10-CM | POA: Diagnosis not present

## 2013-02-13 DIAGNOSIS — M545 Low back pain: Secondary | ICD-10-CM | POA: Diagnosis not present

## 2013-02-13 DIAGNOSIS — R42 Dizziness and giddiness: Secondary | ICD-10-CM | POA: Diagnosis not present

## 2013-02-22 ENCOUNTER — Emergency Department (HOSPITAL_COMMUNITY): Payer: Medicare Other

## 2013-02-22 ENCOUNTER — Encounter (HOSPITAL_COMMUNITY): Payer: Self-pay | Admitting: Emergency Medicine

## 2013-02-22 ENCOUNTER — Emergency Department (HOSPITAL_COMMUNITY)
Admission: EM | Admit: 2013-02-22 | Discharge: 2013-02-22 | Disposition: A | Discharge status: Home / Self Care | Payer: Medicare Other | Attending: Emergency Medicine | Admitting: Emergency Medicine

## 2013-02-22 DIAGNOSIS — Z8701 Personal history of pneumonia (recurrent): Secondary | ICD-10-CM | POA: Diagnosis not present

## 2013-02-22 DIAGNOSIS — F29 Unspecified psychosis not due to a substance or known physiological condition: Secondary | ICD-10-CM | POA: Diagnosis not present

## 2013-02-22 DIAGNOSIS — Z862 Personal history of diseases of the blood and blood-forming organs and certain disorders involving the immune mechanism: Secondary | ICD-10-CM | POA: Insufficient documentation

## 2013-02-22 DIAGNOSIS — R509 Fever, unspecified: Secondary | ICD-10-CM | POA: Insufficient documentation

## 2013-02-22 DIAGNOSIS — Z79899 Other long term (current) drug therapy: Secondary | ICD-10-CM | POA: Diagnosis not present

## 2013-02-22 DIAGNOSIS — S79919A Unspecified injury of unspecified hip, initial encounter: Secondary | ICD-10-CM | POA: Diagnosis not present

## 2013-02-22 DIAGNOSIS — R51 Headache: Secondary | ICD-10-CM | POA: Diagnosis not present

## 2013-02-22 DIAGNOSIS — Z792 Long term (current) use of antibiotics: Secondary | ICD-10-CM | POA: Insufficient documentation

## 2013-02-22 DIAGNOSIS — IMO0001 Reserved for inherently not codable concepts without codable children: Secondary | ICD-10-CM | POA: Insufficient documentation

## 2013-02-22 DIAGNOSIS — R059 Cough, unspecified: Secondary | ICD-10-CM | POA: Insufficient documentation

## 2013-02-22 DIAGNOSIS — E039 Hypothyroidism, unspecified: Secondary | ICD-10-CM | POA: Diagnosis not present

## 2013-02-22 DIAGNOSIS — R5383 Other fatigue: Secondary | ICD-10-CM | POA: Diagnosis not present

## 2013-02-22 DIAGNOSIS — R5381 Other malaise: Secondary | ICD-10-CM | POA: Insufficient documentation

## 2013-02-22 DIAGNOSIS — F3289 Other specified depressive episodes: Secondary | ICD-10-CM | POA: Insufficient documentation

## 2013-02-22 DIAGNOSIS — R404 Transient alteration of awareness: Secondary | ICD-10-CM | POA: Diagnosis not present

## 2013-02-22 DIAGNOSIS — M25559 Pain in unspecified hip: Secondary | ICD-10-CM | POA: Insufficient documentation

## 2013-02-22 DIAGNOSIS — F329 Major depressive disorder, single episode, unspecified: Secondary | ICD-10-CM | POA: Insufficient documentation

## 2013-02-22 DIAGNOSIS — Z8679 Personal history of other diseases of the circulatory system: Secondary | ICD-10-CM | POA: Diagnosis not present

## 2013-02-22 DIAGNOSIS — R0602 Shortness of breath: Secondary | ICD-10-CM | POA: Diagnosis not present

## 2013-02-22 DIAGNOSIS — Z8669 Personal history of other diseases of the nervous system and sense organs: Secondary | ICD-10-CM | POA: Diagnosis not present

## 2013-02-22 DIAGNOSIS — M549 Dorsalgia, unspecified: Secondary | ICD-10-CM | POA: Diagnosis not present

## 2013-02-22 DIAGNOSIS — Z8739 Personal history of other diseases of the musculoskeletal system and connective tissue: Secondary | ICD-10-CM | POA: Diagnosis not present

## 2013-02-22 DIAGNOSIS — Z87891 Personal history of nicotine dependence: Secondary | ICD-10-CM | POA: Insufficient documentation

## 2013-02-22 DIAGNOSIS — F411 Generalized anxiety disorder: Secondary | ICD-10-CM | POA: Diagnosis not present

## 2013-02-22 DIAGNOSIS — Z86718 Personal history of other venous thrombosis and embolism: Secondary | ICD-10-CM | POA: Insufficient documentation

## 2013-02-22 DIAGNOSIS — Z8719 Personal history of other diseases of the digestive system: Secondary | ICD-10-CM | POA: Insufficient documentation

## 2013-02-22 DIAGNOSIS — R05 Cough: Secondary | ICD-10-CM | POA: Diagnosis not present

## 2013-02-22 DIAGNOSIS — I959 Hypotension, unspecified: Secondary | ICD-10-CM | POA: Diagnosis not present

## 2013-02-22 DIAGNOSIS — S79929A Unspecified injury of unspecified thigh, initial encounter: Secondary | ICD-10-CM | POA: Diagnosis not present

## 2013-02-22 LAB — URINALYSIS, ROUTINE W REFLEX MICROSCOPIC
Bilirubin Urine: NEGATIVE
Glucose, UA: NEGATIVE mg/dL
Ketones, ur: NEGATIVE mg/dL
Nitrite: NEGATIVE
pH: 6.5 (ref 5.0–8.0)

## 2013-02-22 LAB — URINE MICROSCOPIC-ADD ON

## 2013-02-22 LAB — CBC WITH DIFFERENTIAL/PLATELET
Basophils Absolute: 0 10*3/uL (ref 0.0–0.1)
HCT: 38.6 % (ref 36.0–46.0)
Lymphocytes Relative: 21 % (ref 12–46)
Monocytes Absolute: 0.6 10*3/uL (ref 0.1–1.0)
Neutro Abs: 5.1 10*3/uL (ref 1.7–7.7)
Neutrophils Relative %: 67 % (ref 43–77)
RDW: 13.5 % (ref 11.5–15.5)
WBC: 7.6 10*3/uL (ref 4.0–10.5)

## 2013-02-22 LAB — BASIC METABOLIC PANEL
BUN: 13 mg/dL (ref 6–23)
CO2: 27 mEq/L (ref 19–32)
Chloride: 96 mEq/L (ref 96–112)
Creatinine, Ser: 0.86 mg/dL (ref 0.50–1.10)
Glucose, Bld: 115 mg/dL — ABNORMAL HIGH (ref 70–99)

## 2013-02-22 LAB — LACTIC ACID, PLASMA: Lactic Acid, Venous: 1.1 mmol/L (ref 0.5–2.2)

## 2013-02-22 LAB — HEPATIC FUNCTION PANEL
AST: 31 U/L (ref 0–37)
Albumin: 3.5 g/dL (ref 3.5–5.2)
Bilirubin, Direct: <0.1 mg/dL (ref 0.0–0.3)

## 2013-02-22 LAB — AMMONIA: Ammonia: 12 umol/L (ref 11–60)

## 2013-02-22 MED ORDER — SODIUM CHLORIDE 0.9 % IV BOLUS (SEPSIS)
500.0000 mL | Freq: Once | INTRAVENOUS | Status: AC
  Administered 2013-02-22: 500 mL via INTRAVENOUS

## 2013-02-22 NOTE — ED Notes (Signed)
Patient c/o weakness, being treated for recent UTI with Cipro, family reports condition has not improved.  Family reports patient has been more confused recently.  Denies nausea, vomiting, pain, or fever.

## 2013-02-22 NOTE — ED Notes (Signed)
BJY:[id number]<FA> Expected date:02/22/13<BR> Expected time:11:39 AM<BR> Means of arrival:Ambulance<BR> Comments:<BR> AMS

## 2013-02-22 NOTE — ED Notes (Signed)
Patient transported to X-ray 

## 2013-02-22 NOTE — ED Notes (Signed)
RN request to obtain labs with start of IV.

## 2013-02-22 NOTE — ED Notes (Signed)
Patient reports she has had a cough for about one month.  Patient vomited x 1 this AM. Feels nauseated now.

## 2013-02-22 NOTE — ED Provider Notes (Signed)
History     CSN: [id number]  Arrival date & time 02/22/13  1155   First MD Initiated Contact with Patient 02/22/13 1207      Chief Complaint  Patient presents with  . Weakness    (Consider location/radiation/quality/duration/timing/severity/associated sxs/prior treatment) HPI Comments: Patient from home with one week history of generalized weakness, decreased by mouth intake and confusion. History of recurrent UTIs started on Cipro 3 days ago. Temperature of 101 at home today. Dry cough since yesterday. Poor by mouth intake and generalized weakness not on any get out of bed. Complaining of soreness to right hip for the past week which is why she is in bed. Denies any falls. Denies any chest pain, abdominal pain, change in bowel habits or vomiting.  The history is provided by the patient, the EMS personnel and a relative.    Past Medical History  Diagnosis Date  . Hypotension   . Depression with anxiety   . Cough     Wert-onset 08/2009  . Deep venous thrombosis   . Cervicalgia   . Hypothyroidism   . Blood transfusion   . Constipation   . Shortness of breath   . Sleep apnea   . Arthritis   . Anxiety   . Dysrhythmia   . Pneumonia   . Anemia   . Depression   . Lumbar radiculopathy 09/26/2011  . Headache 09/27/2011    Past Surgical History  Procedure Laterality Date  . Replacement total knee bilateral    . Appendectomy    . Breast excisional biopsy      breast bxs on left x 2, breast bx of right x 1-all benign  . Laparoscopic ovarian cystectomy      Family History  Problem Relation Age of Onset  . Heart disease Father   . Malignant hyperthermia Father   . Deep vein thrombosis Son     History  Substance Use Topics  . Smoking status: Former Smoker -- 0.50 packs/day    Types: Cigarettes    Quit date: 11/20/1968  . Smokeless tobacco: Never Used     Comment: smoked for approx. 10 years  . Alcohol Use: 0.6 oz/week    1 Glasses of wine per week     Comment: rare     OB History   Grav Para Term Preterm Abortions TAB SAB Ect Mult Living                  Review of Systems  Constitutional: Positive for activity change, appetite change and fatigue. Negative for fever.  HENT: Negative for congestion and rhinorrhea.   Respiratory: Positive for cough and shortness of breath. Negative for chest tightness.   Cardiovascular: Negative for chest pain.  Gastrointestinal: Negative for nausea, vomiting and abdominal pain.  Genitourinary: Negative for dysuria, hematuria, vaginal bleeding and vaginal discharge.  Musculoskeletal: Positive for myalgias, back pain and arthralgias.  Skin: Negative for rash.  Neurological: Positive for weakness. Negative for dizziness and headaches.  A complete 10 system review of systems was obtained and all systems are negative except as noted in the HPI and PMH.    Allergies  Clindamycin hcl  Home Medications   Current Outpatient Rx  Name  Route  Sig  Dispense  Refill  . buPROPion (WELLBUTRIN XL) 300 MG 24 hr tablet   Oral   Take 300 mg by mouth every morning.          . diazepam (VALIUM) 10 MG tablet   Oral  Take 10 mg by mouth every 6 (six) hours as needed for anxiety.         [name] levothyroxine (SYNTHROID, LEVOTHROID) 50 MCG tablet   Oral   Take 50 mcg by mouth daily.          . nitrofurantoin, macrocrystal-monohydrate, (MACROBID) 100 MG capsule   Oral   Take 100 mg by mouth every morning.         [name] oxyCODONE-acetaminophen (PERCOCET) 5-325 MG per tablet   Oral   Take 1 tablet by mouth every 4 (four) hours as needed. pain         . temazepam (RESTORIL) 30 MG capsule   Oral   Take 30 mg by mouth at bedtime.         [name] venlafaxine XR (EFFEXOR-XR) 150 MG 24 hr capsule   Oral   Take 150 mg by mouth every morning.           BP 146/78  Pulse 78  Temp(Src) 98.2 F (36.8 C) (Oral)  Resp 20  SpO2 97%  Physical Exam  Constitutional: She is oriented to person, place, and time. She appears  well-developed and well-nourished. No distress.  HENT:  Head: Normocephalic and atraumatic.  Mouth/Throat: Oropharynx is clear and moist. No oropharyngeal exudate.  Eyes: Conjunctivae and EOM are normal. Pupils are equal, round, and reactive to light.  Neck: Normal range of motion. Neck supple.  Cardiovascular: Normal rate, regular rhythm and normal heart sounds.   No murmur heard. Pulmonary/Chest: Effort normal and breath sounds normal. No respiratory distress.  Abdominal: Soft. There is no tenderness. There is no rebound and no guarding.  Musculoskeletal: Normal range of motion. She exhibits no tenderness.  TTP R lateral hip. No pain with logrolling, no external rotation or shortening.  Neurological: She is alert and oriented to person, place, and time. No cranial nerve deficit. She exhibits normal muscle tone. Coordination normal.  Skin: Skin is warm.    ED Course  Procedures (including critical care time)  Labs Reviewed  URINALYSIS, ROUTINE W REFLEX MICROSCOPIC - Abnormal; Notable for the following:    Hgb urine dipstick TRACE (*)    All other components within normal limits  BASIC METABOLIC PANEL - Abnormal; Notable for the following:    Sodium 133 (*)    Glucose, Bld 115 (*)    GFR calc non Af Amer 64 (*)    GFR calc Af Amer 75 (*)    All other components within normal limits  URINE CULTURE  TROPONIN I  LACTIC ACID, PLASMA  AMMONIA  HEPATIC FUNCTION PANEL  URINE MICROSCOPIC-ADD ON  CBC WITH DIFFERENTIAL  CBC WITH DIFFERENTIAL   Ct Head Wo Contrast  02/22/2013  *RADIOLOGY REPORT*  Clinical Data: History of depression.  Headache.  Weakness.  CT HEAD WITHOUT CONTRAST  Technique:  Contiguous axial images were obtained from the base of the skull through the vertex without contrast.  Comparison: 03/01/2012.  Findings: No intracranial hemorrhage.  Prominent small vessel disease type changes without CT evidence of large acute infarct.  Small acute infarct would be difficult to  exclude in the setting of significant white matter type changes.  Global atrophy.  Ventricular prominence unchanged and probably related to atrophy.  No intracranial mass lesion detected on this unenhanced exam. Partially empty sella incidentally noted.  Minimal mucosal thickening ethmoid sinus air cells.  IMPRESSION: No intracranial hemorrhage.  Prominent small vessel disease type changes without CT evidence of large acute infarct as noted above.  Global atrophy.  Ventricular prominence stable.   Original Report Authenticated By: Lacy Duverney, M.D.    Dg Chest Portable 1 View  02/22/2013  *RADIOLOGY REPORT*  Clinical Data: Weakness and shortness of breath.  CHEST - 1 VIEW  Comparison:  11/05/2010  Findings: The heart size and mediastinal contours are within normal limits.  Both lungs are clear.  IMPRESSION: No active disease.   Original Report Authenticated By: Irish Lack, M.D.      1. Fatigue       MDM  1 week history of generalized weakness and intermittent confusion.  Decreaed PO intake and mobility.  Started cipro 3 days ago for possible UTI.    Oriented xz3.  No focal deficits. UA negative. Culture sent Labs appear to be at baseline. CT head neg, CXR neg  Patient anxious to go home.  Will attempt ambulation and pO challenge CBC and R hip xray pending at sign out to Dr. Radford Pax.      Date: 02/22/2013  Rate: 80  Rhythm: normal sinus rhythm  QRS Axis: normal  Intervals: normal  ST/T Wave abnormalities: normal  Conduction Disutrbances:none  Narrative Interpretation:   Old EKG Reviewed: unchanged    Glynn Octave, MD 02/22/13 1630

## 2013-02-22 NOTE — ED Notes (Signed)
Patient transported to CT 

## 2013-02-22 NOTE — ED Notes (Signed)
Patient's daughter reports patient has not been walking in the last day and normally uses a walker to ambulate.  Patient not able to ambulate now.

## 2013-02-22 NOTE — ED Notes (Signed)
Patient's daughter reports patient more confused, talking about things that happened in the past and about people who are not present in the room.  Dr. Manus Gunning notified.  Daughter also reported patient not eating or drinking like she usually does.

## 2013-02-23 LAB — URINE CULTURE
Colony Count: NO GROWTH
Culture: NO GROWTH

## 2013-02-24 ENCOUNTER — Emergency Department (HOSPITAL_COMMUNITY): Payer: Medicare Other

## 2013-02-24 ENCOUNTER — Encounter (HOSPITAL_COMMUNITY): Payer: Self-pay | Admitting: Emergency Medicine

## 2013-02-24 ENCOUNTER — Emergency Department (HOSPITAL_COMMUNITY)
Admission: EM | Admit: 2013-02-24 | Discharge: 2013-02-24 | Disposition: A | Discharge status: Home / Self Care | Payer: Medicare Other | Attending: Emergency Medicine | Admitting: Emergency Medicine

## 2013-02-24 DIAGNOSIS — Z79899 Other long term (current) drug therapy: Secondary | ICD-10-CM | POA: Diagnosis not present

## 2013-02-24 DIAGNOSIS — Z8679 Personal history of other diseases of the circulatory system: Secondary | ICD-10-CM | POA: Insufficient documentation

## 2013-02-24 DIAGNOSIS — Z862 Personal history of diseases of the blood and blood-forming organs and certain disorders involving the immune mechanism: Secondary | ICD-10-CM | POA: Insufficient documentation

## 2013-02-24 DIAGNOSIS — E039 Hypothyroidism, unspecified: Secondary | ICD-10-CM | POA: Insufficient documentation

## 2013-02-24 DIAGNOSIS — R5381 Other malaise: Secondary | ICD-10-CM

## 2013-02-24 DIAGNOSIS — F329 Major depressive disorder, single episode, unspecified: Secondary | ICD-10-CM | POA: Insufficient documentation

## 2013-02-24 DIAGNOSIS — Z87891 Personal history of nicotine dependence: Secondary | ICD-10-CM | POA: Insufficient documentation

## 2013-02-24 DIAGNOSIS — G4730 Sleep apnea, unspecified: Secondary | ICD-10-CM | POA: Insufficient documentation

## 2013-02-24 DIAGNOSIS — Z8701 Personal history of pneumonia (recurrent): Secondary | ICD-10-CM | POA: Insufficient documentation

## 2013-02-24 DIAGNOSIS — F3289 Other specified depressive episodes: Secondary | ICD-10-CM | POA: Insufficient documentation

## 2013-02-24 DIAGNOSIS — S0990XA Unspecified injury of head, initial encounter: Secondary | ICD-10-CM | POA: Diagnosis not present

## 2013-02-24 DIAGNOSIS — Z8719 Personal history of other diseases of the digestive system: Secondary | ICD-10-CM | POA: Insufficient documentation

## 2013-02-24 DIAGNOSIS — Z86718 Personal history of other venous thrombosis and embolism: Secondary | ICD-10-CM | POA: Insufficient documentation

## 2013-02-24 DIAGNOSIS — S79929A Unspecified injury of unspecified thigh, initial encounter: Secondary | ICD-10-CM | POA: Diagnosis not present

## 2013-02-24 DIAGNOSIS — Z8739 Personal history of other diseases of the musculoskeletal system and connective tissue: Secondary | ICD-10-CM | POA: Diagnosis not present

## 2013-02-24 DIAGNOSIS — Y939 Activity, unspecified: Secondary | ICD-10-CM | POA: Insufficient documentation

## 2013-02-24 DIAGNOSIS — T1490XA Injury, unspecified, initial encounter: Secondary | ICD-10-CM | POA: Diagnosis not present

## 2013-02-24 DIAGNOSIS — S7000XA Contusion of unspecified hip, initial encounter: Secondary | ICD-10-CM | POA: Insufficient documentation

## 2013-02-24 DIAGNOSIS — M25559 Pain in unspecified hip: Secondary | ICD-10-CM | POA: Diagnosis not present

## 2013-02-24 DIAGNOSIS — Y92009 Unspecified place in unspecified non-institutional (private) residence as the place of occurrence of the external cause: Secondary | ICD-10-CM | POA: Insufficient documentation

## 2013-02-24 DIAGNOSIS — S7001XA Contusion of right hip, initial encounter: Secondary | ICD-10-CM

## 2013-02-24 DIAGNOSIS — W010XXA Fall on same level from slipping, tripping and stumbling without subsequent striking against object, initial encounter: Secondary | ICD-10-CM | POA: Insufficient documentation

## 2013-02-24 NOTE — Progress Notes (Signed)
[facility] ED CM consulted by EDP, Steinl about home health services for pt.  EDP reports not having medical reason to admit pt and her daughter wanting pt to go to snf and being told by pcp to come to [facility] ED.   CM spoke with pt and her daughter details about medicare guidelines, home health [facility]) (length of stay in home, types of [facility] staff available, coverage, primary caregiver, up to 24 hrs before services may be started) and Private duty nursing (PDN-coverage, length of stay in the home types of staff available)  Daughter reports pt was in [facility] (snf- coverage and services offered) previously for rehab and wanted to know "how can she get in" CM discussed that for snf placement pt does need a skilled need. CM reviewed availability of HH RN/SW to assist pcp to get pt to snf (if desired disposition) from the community level. CM provided pt and daughter with a list of [place] home health agencies, PDN, and snfs.  Pt reports she was [facility] and she stopped the services after she was given a sheet of exercises to practice independently Daughter reports pt's son stays with pt a night but not during the day Daughter reports pt brought in ED during the recent weekend after pt had not taken her behavior medicines for three days" but she is "much better today but has fallen" Daughter reports she and her brother checked on pt prior to going to work this 02/24/13 am. Pt reports she has a cane and walker. She informed CM she was using her cane when she fell today but did not get dizzy or faint Reports she "just lost where I was going"  Pt and daughter agreed to home health services to assist with placement from community Pending orders from EDP for [facility] (initial assessment, safety evaluation), PT (therapy prn) and SW (to assist with community facility placement)

## 2013-02-24 NOTE — Progress Notes (Signed)
Choices offered   HOME HEALTH AGENCIES SERVING [place]   Agencies that are Medicare-Certified and are affiliated with [facility]  Telephone Number Address  [facility]   [facility] has ownership interest in this company; however, you are under no obligation to use this agency. [phone] or  [phone] [address] [place], [place] [address] [email or web address]   Agencies that are Medicare-Certified and are not affiliated with [facility]                                                                                 Home Health Agency Telephone Number Address  [facility] [phone] Fax [phone] [address], [address] [place], [place]  [address] [email or web address]  [facility] [phone] or [phone] Fax [phone] [address] [place], [place] [address] [email or web address]  [facility] [phone] Fax [phone] [address] [place], [place] [address] [email or web address]  [facility] [phone] Fax [phone] [address], [address] [place], [place]  [address] [email or web address]  [facility] [phone] Fax [phone] [address] [place], [place] [address] [email or web address]  [facility] [phone] [address] [place], [place] [address] [email or web address]  [facility] [phone]  [address] [place], [place] [address] [email or web address]  [facility] [phone] or [phone] Fax number [phone] [address] [place], [place]  [address] [email or web address]  [facility] [phone] Fax [phone] [address] , [place]   [address]  [facility]  [phone] Fax [phone] [address] [place], [place] [address] [email or web address]   

## 2013-02-24 NOTE — ED Provider Notes (Signed)
History     CSN: [id number]  Arrival date & time 02/24/13  1248   First MD Initiated Contact with Patient 02/24/13 1304      Chief Complaint  Patient presents with  . Hip Pain    right    (Consider location/radiation/quality/duration/timing/severity/associated sxs/prior treatment) Patient is a 78 y.o. female presenting with hip pain. The history is provided by the patient.  Hip Pain Pertinent negatives include no chest pain, no abdominal pain, no headaches and no shortness of breath.  pt from home, s/p fall. Pt states has long hx falls x years, states is supposed to walk w cane/walker, today was trying to get around on own, tripped, fall forward. C/o right hip pain. Constant, mod, dull, non radiating. Denies loc. No faintness or dizziness. Denies headache. No neck or back pain. States recent health at baseline, although states was recently in ed s/p another fall.     Past Medical History  Diagnosis Date  . Hypotension   . Depression with anxiety   . Cough     Wert-onset 08/2009  . Deep venous thrombosis   . Cervicalgia   . Hypothyroidism   . Blood transfusion   . Constipation   . Shortness of breath   . Sleep apnea   . Arthritis   . Anxiety   . Dysrhythmia   . Pneumonia   . Anemia   . Depression   . Lumbar radiculopathy 09/26/2011  . Headache 09/27/2011    Past Surgical History  Procedure Laterality Date  . Replacement total knee bilateral    . Appendectomy    . Breast excisional biopsy      breast bxs on left x 2, breast bx of right x 1-all benign  . Laparoscopic ovarian cystectomy      Family History  Problem Relation Age of Onset  . Heart disease Father   . Malignant hyperthermia Father   . Deep vein thrombosis Son     History  Substance Use Topics  . Smoking status: Former Smoker -- 0.50 packs/day    Types: Cigarettes    Quit date: 11/20/1968  . Smokeless tobacco: Never Used     Comment: smoked for approx. 10 years  . Alcohol Use: 0.6 oz/week   1 Glasses of wine per week     Comment: rare    OB History   Grav Para Term Preterm Abortions TAB SAB Ect Mult Living                  Review of Systems  Constitutional: Negative for fever and chills.  HENT: Negative for neck pain.   Eyes: Negative for pain and visual disturbance.  Respiratory: Negative for cough and shortness of breath.   Cardiovascular: Negative for chest pain.  Gastrointestinal: Negative for abdominal pain.  Genitourinary: Negative for dysuria and flank pain.  Musculoskeletal: Negative for back pain.  Skin: Negative for wound.  Neurological: Negative for weakness, numbness and headaches.  Hematological: Does not bruise/bleed easily.  Psychiatric/Behavioral: Negative for confusion.    Allergies  Clindamycin hcl  Home Medications   Current Outpatient Rx  Name  Route  Sig  Dispense  Refill  . buPROPion (WELLBUTRIN XL) 300 MG 24 hr tablet   Oral   Take 300 mg by mouth every morning.          . diazepam (VALIUM) 10 MG tablet   Oral   Take 10 mg by mouth every 6 (six) hours as needed for anxiety.         [name]  levothyroxine (SYNTHROID, LEVOTHROID) 50 MCG tablet   Oral   Take 50 mcg by mouth daily.          . nitrofurantoin, macrocrystal-monohydrate, (MACROBID) 100 MG capsule   Oral   Take 100 mg by mouth every morning.         [name] oxyCODONE-acetaminophen (PERCOCET) 5-325 MG per tablet   Oral   Take 1 tablet by mouth every 4 (four) hours as needed. pain         . temazepam (RESTORIL) 30 MG capsule   Oral   Take 30 mg by mouth at bedtime.         [name] venlafaxine XR (EFFEXOR-XR) 150 MG 24 hr capsule   Oral   Take 150 mg by mouth every morning.           BP 120/57  Pulse 63  Temp(Src) 98.4 F (36.9 C) (Oral)  Resp 17  SpO2 97%  Physical Exam  Nursing note and vitals reviewed. Constitutional: She is oriented to person, place, and time. She appears well-developed and well-nourished. No distress.  HENT:  Head: Atraumatic.  Nose:  Nose normal.  Mouth/Throat: Oropharynx is clear and moist.  Eyes: Conjunctivae are normal. Pupils are equal, round, and reactive to light. No scleral icterus.  Neck: Neck supple. No tracheal deviation present.  Cardiovascular: Normal rate, regular rhythm, normal heart sounds and intact distal pulses.   Pulmonary/Chest: Effort normal and breath sounds normal. No respiratory distress.  Abdominal: Soft. Normal appearance and bowel sounds are normal. She exhibits no distension. There is no tenderness.  Musculoskeletal: She exhibits no edema.  Tenderness right hip. Distal pulses palp. Good rom bil ext, no other focal bony tenderness noted. CTLS spine, non tender, aligned, no step off.   Neurological: She is alert and oriented to person, place, and time.  Motor intact bil.   Skin: Skin is warm and dry. No rash noted. She is not diaphoretic.  Psychiatric: She has a normal mood and affect.    ED Course  Procedures (including critical care time)  Results for orders placed during the hospital encounter of 02/22/13  URINE CULTURE      Result Value Range   Specimen Description URINE, CATHETERIZED     Special Requests NONE     Culture  Setup Time 02/22/2013 21:38     Colony Count NO GROWTH     Culture NO GROWTH     Report Status 02/23/2013 FINAL    URINALYSIS, ROUTINE W REFLEX MICROSCOPIC      Result Value Range   Color, Urine YELLOW  YELLOW   APPearance CLEAR  CLEAR   Specific Gravity, Urine 1.015  1.005 - 1.030   pH 6.5  5.0 - 8.0   Glucose, UA NEGATIVE  NEGATIVE mg/dL   Hgb urine dipstick TRACE (*) NEGATIVE   Bilirubin Urine NEGATIVE  NEGATIVE   Ketones, ur NEGATIVE  NEGATIVE mg/dL   Protein, ur NEGATIVE  NEGATIVE mg/dL   Urobilinogen, UA 0.2  0.0 - 1.0 mg/dL   Nitrite NEGATIVE  NEGATIVE   Leukocytes, UA NEGATIVE  NEGATIVE  BASIC METABOLIC PANEL      Result Value Range   Sodium 133 (*) 135 - 145 mEq/L   Potassium 3.9  3.5 - 5.1 mEq/L   Chloride 96  96 - 112 mEq/L   CO2 27  19 -  32 mEq/L   Glucose, Bld 115 (*) 70 - 99 mg/dL   BUN 13  6 - 23 mg/dL   Creatinine,  Ser 0.86  0.50 - 1.10 mg/dL   Calcium 9.4  8.4 - 96.0 mg/dL   GFR calc non Af Amer 64 (*) >90 mL/min   GFR calc Af Amer 75 (*) >90 mL/min  TROPONIN I      Result Value Range   Troponin I <0.30  <0.30 ng/mL  LACTIC ACID, PLASMA      Result Value Range   Lactic Acid, Venous 1.1  0.5 - 2.2 mmol/L  AMMONIA      Result Value Range   Ammonia 12  11 - 60 umol/L  HEPATIC FUNCTION PANEL      Result Value Range   Total Protein 6.9  6.0 - 8.3 g/dL   Albumin 3.5  3.5 - 5.2 g/dL   AST 31  0 - 37 U/L   ALT 14  0 - 35 U/L   Alkaline Phosphatase 110  39 - 117 U/L   Total Bilirubin 0.5  0.3 - 1.2 mg/dL   Bilirubin, Direct <4.5  0.0 - 0.3 mg/dL   Indirect Bilirubin NOT CALCULATED  0.3 - 0.9 mg/dL  URINE MICROSCOPIC-ADD ON      Result Value Range   Squamous Epithelial / LPF RARE  RARE   RBC / HPF 0-2  <3 RBC/hpf  CBC WITH DIFFERENTIAL      Result Value Range   WBC 7.6  4.0 - 10.5 K/uL   RBC 4.52  3.87 - 5.11 MIL/uL   Hemoglobin 12.9  12.0 - 15.0 g/dL   HCT 40.9  81.1 - 91.4 %   MCV 85.4  78.0 - 100.0 fL   MCH 28.5  26.0 - 34.0 pg   MCHC 33.4  30.0 - 36.0 g/dL   RDW 78.2  95.6 - 21.3 %   Platelets 209  150 - 400 K/uL   Neutrophils Relative 67  43 - 77 %   Neutro Abs 5.1  1.7 - 7.7 K/uL   Lymphocytes Relative 21  12 - 46 %   Lymphs Abs 1.6  0.7 - 4.0 K/uL   Monocytes Relative 8  3 - 12 %   Monocytes Absolute 0.6  0.1 - 1.0 K/uL   Eosinophils Relative 4  0 - 5 %   Eosinophils Absolute 0.3  0.0 - 0.7 K/uL   Basophils Relative 0  0 - 1 %   Basophils Absolute 0.0  0.0 - 0.1 K/uL   Dg Hip Complete Right  02/24/2013  *RADIOLOGY REPORT*  Clinical Data: Fall with right hip pain.  RIGHT HIP - COMPLETE 2+ VIEW  Comparison: 02/22/2013  Findings: No acute fracture or dislocation identified.  Stable mild degenerative changes.  The bony pelvis is normal in appearance.  IMPRESSION: No acute fracture.   Original Report  Authenticated By: Irish Lack, M.D.    Dg Hip Complete Right  02/22/2013  *RADIOLOGY REPORT*  Clinical Data: Unable to bear weight with multiple falls.  RIGHT HIP - COMPLETE 2+ VIEW  Comparison: None.  Findings: No acute fracture or dislocation is identified.  Mild degenerative changes are present in both hip joints.  No focal bony lesions or destruction identified.  Bony pelvis appears intact without evidence of fracture.  IMPRESSION: No acute fracture.   Original Report Authenticated By: Irish Lack, M.D.    Ct Head Wo Contrast  02/22/2013  *RADIOLOGY REPORT*  Clinical Data: History of depression.  Headache.  Weakness.  CT HEAD WITHOUT CONTRAST  Technique:  Contiguous axial images were obtained from the base of the skull through  the vertex without contrast.  Comparison: 03/01/2012.  Findings: No intracranial hemorrhage.  Prominent small vessel disease type changes without CT evidence of large acute infarct.  Small acute infarct would be difficult to exclude in the setting of significant white matter type changes.  Global atrophy.  Ventricular prominence unchanged and probably related to atrophy.  No intracranial mass lesion detected on this unenhanced exam. Partially empty sella incidentally noted.  Minimal mucosal thickening ethmoid sinus air cells.  IMPRESSION: No intracranial hemorrhage.  Prominent small vessel disease type changes without CT evidence of large acute infarct as noted above.  Global atrophy.  Ventricular prominence stable.   Original Report Authenticated By: Lacy Duverney, M.D.    Dg Chest Portable 1 View  02/22/2013  *RADIOLOGY REPORT*  Clinical Data: Weakness and shortness of breath.  CHEST - 1 VIEW  Comparison:  11/05/2010  Findings: The heart size and mediastinal contours are within normal limits.  Both lungs are clear.  IMPRESSION: No active disease.   Original Report Authenticated By: Irish Lack, M.D.        MDM  Xray hip.  Reviewed nursing notes and prior charts for  additional history.   Hip neg. Pt comfortable.  Recent labs reviewed, unremarkable.  Discussed w pt, f/u pcp in the next couple days.  Will also made home health referral re home health eval - care management asked to speak w pt and family member in ed, re home health, and information on process of exploring [facility] care.  Pt wishes to return home.        Suzi Roots, MD 02/24/13 [phone]

## 2013-02-24 NOTE — Progress Notes (Signed)
Pt reports seeing her pcp last week but only for c/o back pain

## 2013-02-24 NOTE — ED Notes (Signed)
Pt came from home by EMS c/o of right hip pain and head pain due to fall.  pT states she is not for sure how/why she fell.  Per EMS the house is very cluttered with papers scattered on the floor.

## 2013-02-24 NOTE — ED Notes (Signed)
[id number]<EX> Expected date:<BR> Expected time:<BR> Means of arrival:<BR> Comments:<BR> Hip pain

## 2013-02-24 NOTE — Progress Notes (Signed)
Pt and daughter choose [facility] for services CM spoke with Stanton Kidney at [phone] to provided referral Stanton Kidney states pt can be seen on 02/25/13 Pt updated

## 2013-02-26 DIAGNOSIS — F329 Major depressive disorder, single episode, unspecified: Secondary | ICD-10-CM | POA: Diagnosis not present

## 2013-02-26 DIAGNOSIS — I959 Hypotension, unspecified: Secondary | ICD-10-CM | POA: Diagnosis not present

## 2013-02-26 DIAGNOSIS — D649 Anemia, unspecified: Secondary | ICD-10-CM | POA: Diagnosis not present

## 2013-02-26 DIAGNOSIS — M542 Cervicalgia: Secondary | ICD-10-CM | POA: Diagnosis not present

## 2013-02-26 DIAGNOSIS — R269 Unspecified abnormalities of gait and mobility: Secondary | ICD-10-CM | POA: Diagnosis not present

## 2013-02-28 DIAGNOSIS — I959 Hypotension, unspecified: Secondary | ICD-10-CM | POA: Diagnosis not present

## 2013-02-28 DIAGNOSIS — M542 Cervicalgia: Secondary | ICD-10-CM | POA: Diagnosis not present

## 2013-02-28 DIAGNOSIS — F329 Major depressive disorder, single episode, unspecified: Secondary | ICD-10-CM | POA: Diagnosis not present

## 2013-02-28 DIAGNOSIS — D649 Anemia, unspecified: Secondary | ICD-10-CM | POA: Diagnosis not present

## 2013-02-28 DIAGNOSIS — R269 Unspecified abnormalities of gait and mobility: Secondary | ICD-10-CM | POA: Diagnosis not present

## 2013-03-03 DIAGNOSIS — N390 Urinary tract infection, site not specified: Secondary | ICD-10-CM | POA: Diagnosis not present

## 2013-03-03 DIAGNOSIS — R6889 Other general symptoms and signs: Secondary | ICD-10-CM | POA: Diagnosis not present

## 2013-03-03 DIAGNOSIS — F411 Generalized anxiety disorder: Secondary | ICD-10-CM | POA: Diagnosis not present

## 2013-03-04 DIAGNOSIS — R269 Unspecified abnormalities of gait and mobility: Secondary | ICD-10-CM | POA: Diagnosis not present

## 2013-03-04 DIAGNOSIS — D649 Anemia, unspecified: Secondary | ICD-10-CM | POA: Diagnosis not present

## 2013-03-04 DIAGNOSIS — I959 Hypotension, unspecified: Secondary | ICD-10-CM | POA: Diagnosis not present

## 2013-03-04 DIAGNOSIS — M542 Cervicalgia: Secondary | ICD-10-CM | POA: Diagnosis not present

## 2013-03-04 DIAGNOSIS — F329 Major depressive disorder, single episode, unspecified: Secondary | ICD-10-CM | POA: Diagnosis not present

## 2013-03-05 DIAGNOSIS — R269 Unspecified abnormalities of gait and mobility: Secondary | ICD-10-CM | POA: Diagnosis not present

## 2013-03-05 DIAGNOSIS — D649 Anemia, unspecified: Secondary | ICD-10-CM | POA: Diagnosis not present

## 2013-03-05 DIAGNOSIS — F329 Major depressive disorder, single episode, unspecified: Secondary | ICD-10-CM | POA: Diagnosis not present

## 2013-03-05 DIAGNOSIS — I959 Hypotension, unspecified: Secondary | ICD-10-CM | POA: Diagnosis not present

## 2013-03-05 DIAGNOSIS — M542 Cervicalgia: Secondary | ICD-10-CM | POA: Diagnosis not present

## 2013-03-11 ENCOUNTER — Encounter (HOSPITAL_COMMUNITY): Payer: Self-pay | Admitting: Emergency Medicine

## 2013-03-11 ENCOUNTER — Emergency Department (HOSPITAL_COMMUNITY)
Admission: EM | Admit: 2013-03-11 | Discharge: 2013-03-11 | Disposition: A | Discharge status: Home / Self Care | Payer: Medicare Other | Attending: Emergency Medicine | Admitting: Emergency Medicine

## 2013-03-11 DIAGNOSIS — T148XXA Other injury of unspecified body region, initial encounter: Secondary | ICD-10-CM

## 2013-03-11 DIAGNOSIS — S40019A Contusion of unspecified shoulder, initial encounter: Secondary | ICD-10-CM | POA: Insufficient documentation

## 2013-03-11 DIAGNOSIS — Y9289 Other specified places as the place of occurrence of the external cause: Secondary | ICD-10-CM | POA: Insufficient documentation

## 2013-03-11 DIAGNOSIS — Z8709 Personal history of other diseases of the respiratory system: Secondary | ICD-10-CM | POA: Insufficient documentation

## 2013-03-11 DIAGNOSIS — Y9389 Activity, other specified: Secondary | ICD-10-CM | POA: Insufficient documentation

## 2013-03-11 DIAGNOSIS — Z8679 Personal history of other diseases of the circulatory system: Secondary | ICD-10-CM | POA: Diagnosis not present

## 2013-03-11 DIAGNOSIS — Z8701 Personal history of pneumonia (recurrent): Secondary | ICD-10-CM | POA: Insufficient documentation

## 2013-03-11 DIAGNOSIS — E039 Hypothyroidism, unspecified: Secondary | ICD-10-CM | POA: Diagnosis not present

## 2013-03-11 DIAGNOSIS — Z8739 Personal history of other diseases of the musculoskeletal system and connective tissue: Secondary | ICD-10-CM | POA: Diagnosis not present

## 2013-03-11 DIAGNOSIS — F341 Dysthymic disorder: Secondary | ICD-10-CM | POA: Insufficient documentation

## 2013-03-11 DIAGNOSIS — Z8719 Personal history of other diseases of the digestive system: Secondary | ICD-10-CM | POA: Insufficient documentation

## 2013-03-11 DIAGNOSIS — Z862 Personal history of diseases of the blood and blood-forming organs and certain disorders involving the immune mechanism: Secondary | ICD-10-CM | POA: Diagnosis not present

## 2013-03-11 DIAGNOSIS — Z87891 Personal history of nicotine dependence: Secondary | ICD-10-CM | POA: Diagnosis not present

## 2013-03-11 DIAGNOSIS — Z79899 Other long term (current) drug therapy: Secondary | ICD-10-CM | POA: Diagnosis not present

## 2013-03-11 DIAGNOSIS — Z86718 Personal history of other venous thrombosis and embolism: Secondary | ICD-10-CM | POA: Diagnosis not present

## 2013-03-11 DIAGNOSIS — W06XXXA Fall from bed, initial encounter: Secondary | ICD-10-CM | POA: Insufficient documentation

## 2013-03-11 DIAGNOSIS — W1809XA Striking against other object with subsequent fall, initial encounter: Secondary | ICD-10-CM | POA: Insufficient documentation

## 2013-03-11 DIAGNOSIS — Z9181 History of falling: Secondary | ICD-10-CM | POA: Diagnosis not present

## 2013-03-11 DIAGNOSIS — S0990XA Unspecified injury of head, initial encounter: Secondary | ICD-10-CM | POA: Insufficient documentation

## 2013-03-11 DIAGNOSIS — IMO0002 Reserved for concepts with insufficient information to code with codable children: Secondary | ICD-10-CM | POA: Insufficient documentation

## 2013-03-11 DIAGNOSIS — T07XXXA Unspecified multiple injuries, initial encounter: Secondary | ICD-10-CM | POA: Diagnosis not present

## 2013-03-11 DIAGNOSIS — Z8669 Personal history of other diseases of the nervous system and sense organs: Secondary | ICD-10-CM | POA: Diagnosis not present

## 2013-03-11 LAB — URINE MICROSCOPIC-ADD ON

## 2013-03-11 LAB — URINALYSIS, ROUTINE W REFLEX MICROSCOPIC
Nitrite: NEGATIVE
Specific Gravity, Urine: 1.011 (ref 1.005–1.030)
Urobilinogen, UA: 0.2 mg/dL (ref 0.0–1.0)
pH: 6.5 (ref 5.0–8.0)
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
## 2013-03-11 NOTE — ED Notes (Signed)
[id number]<AV> Expected date:<BR> Expected time:<BR> Means of arrival:<BR> Comments:<BR> EMS fall

## 2013-03-11 NOTE — ED Notes (Signed)
H/o falling 2-3x/day. Denies weakness. Trying to get out of bed, no loss of consciousness. Fall unwitnessed. Pain in both shoulders, soreness. Small skin tear to right elbow. Per EMS.

## 2013-03-13 DIAGNOSIS — J449 Chronic obstructive pulmonary disease, unspecified: Secondary | ICD-10-CM | POA: Diagnosis not present

## 2013-03-13 DIAGNOSIS — R269 Unspecified abnormalities of gait and mobility: Secondary | ICD-10-CM | POA: Diagnosis not present

## 2013-03-13 DIAGNOSIS — R279 Unspecified lack of coordination: Secondary | ICD-10-CM | POA: Diagnosis not present

## 2013-03-13 DIAGNOSIS — M1990 Unspecified osteoarthritis, unspecified site: Secondary | ICD-10-CM | POA: Diagnosis not present

## 2013-03-13 DIAGNOSIS — I10 Essential (primary) hypertension: Secondary | ICD-10-CM | POA: Diagnosis not present

## 2013-03-13 DIAGNOSIS — IMO0001 Reserved for inherently not codable concepts without codable children: Secondary | ICD-10-CM | POA: Diagnosis not present

## 2013-03-17 DIAGNOSIS — J449 Chronic obstructive pulmonary disease, unspecified: Secondary | ICD-10-CM | POA: Diagnosis not present

## 2013-03-17 DIAGNOSIS — R279 Unspecified lack of coordination: Secondary | ICD-10-CM | POA: Diagnosis not present

## 2013-03-17 DIAGNOSIS — M1990 Unspecified osteoarthritis, unspecified site: Secondary | ICD-10-CM | POA: Diagnosis not present

## 2013-03-17 DIAGNOSIS — I10 Essential (primary) hypertension: Secondary | ICD-10-CM | POA: Diagnosis not present

## 2013-03-17 DIAGNOSIS — IMO0001 Reserved for inherently not codable concepts without codable children: Secondary | ICD-10-CM | POA: Diagnosis not present

## 2013-03-17 DIAGNOSIS — R269 Unspecified abnormalities of gait and mobility: Secondary | ICD-10-CM | POA: Diagnosis not present

## 2013-03-21 DIAGNOSIS — J449 Chronic obstructive pulmonary disease, unspecified: Secondary | ICD-10-CM | POA: Diagnosis not present

## 2013-03-21 DIAGNOSIS — IMO0001 Reserved for inherently not codable concepts without codable children: Secondary | ICD-10-CM | POA: Diagnosis not present

## 2013-03-21 DIAGNOSIS — R279 Unspecified lack of coordination: Secondary | ICD-10-CM | POA: Diagnosis not present

## 2013-03-21 DIAGNOSIS — I10 Essential (primary) hypertension: Secondary | ICD-10-CM | POA: Diagnosis not present

## 2013-03-21 DIAGNOSIS — R269 Unspecified abnormalities of gait and mobility: Secondary | ICD-10-CM | POA: Diagnosis not present

## 2013-03-21 DIAGNOSIS — M1990 Unspecified osteoarthritis, unspecified site: Secondary | ICD-10-CM | POA: Diagnosis not present

## 2013-03-24 DIAGNOSIS — IMO0001 Reserved for inherently not codable concepts without codable children: Secondary | ICD-10-CM | POA: Diagnosis not present

## 2013-03-24 DIAGNOSIS — J449 Chronic obstructive pulmonary disease, unspecified: Secondary | ICD-10-CM | POA: Diagnosis not present

## 2013-03-24 DIAGNOSIS — R279 Unspecified lack of coordination: Secondary | ICD-10-CM | POA: Diagnosis not present

## 2013-03-24 DIAGNOSIS — R269 Unspecified abnormalities of gait and mobility: Secondary | ICD-10-CM | POA: Diagnosis not present

## 2013-03-24 DIAGNOSIS — I10 Essential (primary) hypertension: Secondary | ICD-10-CM | POA: Diagnosis not present

## 2013-03-24 DIAGNOSIS — M1990 Unspecified osteoarthritis, unspecified site: Secondary | ICD-10-CM | POA: Diagnosis not present

## 2013-03-28 DIAGNOSIS — IMO0001 Reserved for inherently not codable concepts without codable children: Secondary | ICD-10-CM | POA: Diagnosis not present

## 2013-03-28 DIAGNOSIS — J449 Chronic obstructive pulmonary disease, unspecified: Secondary | ICD-10-CM | POA: Diagnosis not present

## 2013-03-28 DIAGNOSIS — I10 Essential (primary) hypertension: Secondary | ICD-10-CM | POA: Diagnosis not present

## 2013-03-28 DIAGNOSIS — R269 Unspecified abnormalities of gait and mobility: Secondary | ICD-10-CM | POA: Diagnosis not present

## 2013-03-28 DIAGNOSIS — M1990 Unspecified osteoarthritis, unspecified site: Secondary | ICD-10-CM | POA: Diagnosis not present

## 2013-03-28 DIAGNOSIS — R279 Unspecified lack of coordination: Secondary | ICD-10-CM | POA: Diagnosis not present

## 2013-03-31 DIAGNOSIS — M1990 Unspecified osteoarthritis, unspecified site: Secondary | ICD-10-CM | POA: Diagnosis not present

## 2013-03-31 DIAGNOSIS — J449 Chronic obstructive pulmonary disease, unspecified: Secondary | ICD-10-CM | POA: Diagnosis not present

## 2013-03-31 DIAGNOSIS — IMO0001 Reserved for inherently not codable concepts without codable children: Secondary | ICD-10-CM | POA: Diagnosis not present

## 2013-03-31 DIAGNOSIS — I10 Essential (primary) hypertension: Secondary | ICD-10-CM | POA: Diagnosis not present

## 2013-03-31 DIAGNOSIS — R279 Unspecified lack of coordination: Secondary | ICD-10-CM | POA: Diagnosis not present

## 2013-03-31 DIAGNOSIS — R269 Unspecified abnormalities of gait and mobility: Secondary | ICD-10-CM | POA: Diagnosis not present

## 2013-04-04 DIAGNOSIS — J449 Chronic obstructive pulmonary disease, unspecified: Secondary | ICD-10-CM | POA: Diagnosis not present

## 2013-04-04 DIAGNOSIS — IMO0001 Reserved for inherently not codable concepts without codable children: Secondary | ICD-10-CM | POA: Diagnosis not present

## 2013-04-04 DIAGNOSIS — R269 Unspecified abnormalities of gait and mobility: Secondary | ICD-10-CM | POA: Diagnosis not present

## 2013-04-04 DIAGNOSIS — I10 Essential (primary) hypertension: Secondary | ICD-10-CM | POA: Diagnosis not present

## 2013-04-04 DIAGNOSIS — M1990 Unspecified osteoarthritis, unspecified site: Secondary | ICD-10-CM | POA: Diagnosis not present

## 2013-04-04 DIAGNOSIS — R279 Unspecified lack of coordination: Secondary | ICD-10-CM | POA: Diagnosis not present

## 2013-04-07 DIAGNOSIS — R269 Unspecified abnormalities of gait and mobility: Secondary | ICD-10-CM | POA: Diagnosis not present

## 2013-04-07 DIAGNOSIS — J449 Chronic obstructive pulmonary disease, unspecified: Secondary | ICD-10-CM | POA: Diagnosis not present

## 2013-04-07 DIAGNOSIS — R279 Unspecified lack of coordination: Secondary | ICD-10-CM | POA: Diagnosis not present

## 2013-04-07 DIAGNOSIS — IMO0001 Reserved for inherently not codable concepts without codable children: Secondary | ICD-10-CM | POA: Diagnosis not present

## 2013-04-07 DIAGNOSIS — I10 Essential (primary) hypertension: Secondary | ICD-10-CM | POA: Diagnosis not present

## 2013-04-07 DIAGNOSIS — M1990 Unspecified osteoarthritis, unspecified site: Secondary | ICD-10-CM | POA: Diagnosis not present

## 2013-04-11 DIAGNOSIS — R279 Unspecified lack of coordination: Secondary | ICD-10-CM | POA: Diagnosis not present

## 2013-04-11 DIAGNOSIS — R269 Unspecified abnormalities of gait and mobility: Secondary | ICD-10-CM | POA: Diagnosis not present

## 2013-04-11 DIAGNOSIS — J449 Chronic obstructive pulmonary disease, unspecified: Secondary | ICD-10-CM | POA: Diagnosis not present

## 2013-04-11 DIAGNOSIS — IMO0001 Reserved for inherently not codable concepts without codable children: Secondary | ICD-10-CM | POA: Diagnosis not present

## 2013-04-11 DIAGNOSIS — M1990 Unspecified osteoarthritis, unspecified site: Secondary | ICD-10-CM | POA: Diagnosis not present

## 2013-04-11 DIAGNOSIS — I10 Essential (primary) hypertension: Secondary | ICD-10-CM | POA: Diagnosis not present

## 2013-04-17 DIAGNOSIS — R279 Unspecified lack of coordination: Secondary | ICD-10-CM | POA: Diagnosis not present

## 2013-04-17 DIAGNOSIS — I10 Essential (primary) hypertension: Secondary | ICD-10-CM | POA: Diagnosis not present

## 2013-04-17 DIAGNOSIS — R269 Unspecified abnormalities of gait and mobility: Secondary | ICD-10-CM | POA: Diagnosis not present

## 2013-04-17 DIAGNOSIS — M1990 Unspecified osteoarthritis, unspecified site: Secondary | ICD-10-CM | POA: Diagnosis not present

## 2013-04-17 DIAGNOSIS — J449 Chronic obstructive pulmonary disease, unspecified: Secondary | ICD-10-CM | POA: Diagnosis not present

## 2013-04-17 DIAGNOSIS — IMO0001 Reserved for inherently not codable concepts without codable children: Secondary | ICD-10-CM | POA: Diagnosis not present

## 2013-04-18 DIAGNOSIS — M1990 Unspecified osteoarthritis, unspecified site: Secondary | ICD-10-CM | POA: Diagnosis not present

## 2013-04-18 DIAGNOSIS — J449 Chronic obstructive pulmonary disease, unspecified: Secondary | ICD-10-CM | POA: Diagnosis not present

## 2013-04-18 DIAGNOSIS — R269 Unspecified abnormalities of gait and mobility: Secondary | ICD-10-CM | POA: Diagnosis not present

## 2013-04-18 DIAGNOSIS — R279 Unspecified lack of coordination: Secondary | ICD-10-CM | POA: Diagnosis not present

## 2013-04-18 DIAGNOSIS — IMO0001 Reserved for inherently not codable concepts without codable children: Secondary | ICD-10-CM | POA: Diagnosis not present

## 2013-04-18 DIAGNOSIS — I10 Essential (primary) hypertension: Secondary | ICD-10-CM | POA: Diagnosis not present

## 2013-04-21 DIAGNOSIS — Z5181 Encounter for therapeutic drug level monitoring: Secondary | ICD-10-CM | POA: Diagnosis not present

## 2013-04-21 DIAGNOSIS — F325 Major depressive disorder, single episode, in full remission: Secondary | ICD-10-CM | POA: Diagnosis not present

## 2013-04-21 DIAGNOSIS — Z79899 Other long term (current) drug therapy: Secondary | ICD-10-CM | POA: Diagnosis not present

## 2013-04-22 DIAGNOSIS — M1990 Unspecified osteoarthritis, unspecified site: Secondary | ICD-10-CM | POA: Diagnosis not present

## 2013-04-22 DIAGNOSIS — R279 Unspecified lack of coordination: Secondary | ICD-10-CM | POA: Diagnosis not present

## 2013-04-22 DIAGNOSIS — J449 Chronic obstructive pulmonary disease, unspecified: Secondary | ICD-10-CM | POA: Diagnosis not present

## 2013-04-22 DIAGNOSIS — I10 Essential (primary) hypertension: Secondary | ICD-10-CM | POA: Diagnosis not present

## 2013-04-22 DIAGNOSIS — R269 Unspecified abnormalities of gait and mobility: Secondary | ICD-10-CM | POA: Diagnosis not present

## 2013-04-22 DIAGNOSIS — IMO0001 Reserved for inherently not codable concepts without codable children: Secondary | ICD-10-CM | POA: Diagnosis not present

## 2013-04-23 ENCOUNTER — Emergency Department (HOSPITAL_COMMUNITY): Payer: No Typology Code available for payment source

## 2013-04-23 ENCOUNTER — Emergency Department (HOSPITAL_COMMUNITY)
Admission: EM | Admit: 2013-04-23 | Discharge: 2013-04-23 | Disposition: A | Discharge status: Home / Self Care | Payer: No Typology Code available for payment source | Attending: Emergency Medicine | Admitting: Emergency Medicine

## 2013-04-23 ENCOUNTER — Encounter (HOSPITAL_COMMUNITY): Payer: Self-pay | Admitting: Emergency Medicine

## 2013-04-23 DIAGNOSIS — M129 Arthropathy, unspecified: Secondary | ICD-10-CM | POA: Insufficient documentation

## 2013-04-23 DIAGNOSIS — Y9389 Activity, other specified: Secondary | ICD-10-CM | POA: Insufficient documentation

## 2013-04-23 DIAGNOSIS — G4730 Sleep apnea, unspecified: Secondary | ICD-10-CM | POA: Insufficient documentation

## 2013-04-23 DIAGNOSIS — M25569 Pain in unspecified knee: Secondary | ICD-10-CM | POA: Diagnosis not present

## 2013-04-23 DIAGNOSIS — IMO0002 Reserved for concepts with insufficient information to code with codable children: Secondary | ICD-10-CM | POA: Diagnosis not present

## 2013-04-23 DIAGNOSIS — F341 Dysthymic disorder: Secondary | ICD-10-CM | POA: Insufficient documentation

## 2013-04-23 DIAGNOSIS — Z87891 Personal history of nicotine dependence: Secondary | ICD-10-CM | POA: Insufficient documentation

## 2013-04-23 DIAGNOSIS — E039 Hypothyroidism, unspecified: Secondary | ICD-10-CM | POA: Insufficient documentation

## 2013-04-23 DIAGNOSIS — Z86718 Personal history of other venous thrombosis and embolism: Secondary | ICD-10-CM | POA: Insufficient documentation

## 2013-04-23 DIAGNOSIS — S298XXA Other specified injuries of thorax, initial encounter: Secondary | ICD-10-CM | POA: Diagnosis not present

## 2013-04-23 DIAGNOSIS — R079 Chest pain, unspecified: Secondary | ICD-10-CM | POA: Diagnosis not present

## 2013-04-23 DIAGNOSIS — S8990XA Unspecified injury of unspecified lower leg, initial encounter: Secondary | ICD-10-CM | POA: Diagnosis not present

## 2013-04-23 DIAGNOSIS — S20219A Contusion of unspecified front wall of thorax, initial encounter: Secondary | ICD-10-CM | POA: Diagnosis not present

## 2013-04-23 DIAGNOSIS — R0789 Other chest pain: Secondary | ICD-10-CM | POA: Diagnosis not present

## 2013-04-23 DIAGNOSIS — Z79899 Other long term (current) drug therapy: Secondary | ICD-10-CM | POA: Insufficient documentation

## 2013-04-23 DIAGNOSIS — Z8679 Personal history of other diseases of the circulatory system: Secondary | ICD-10-CM | POA: Insufficient documentation

## 2013-04-23 DIAGNOSIS — S8000XA Contusion of unspecified knee, initial encounter: Secondary | ICD-10-CM | POA: Diagnosis not present

## 2013-04-23 DIAGNOSIS — Z8739 Personal history of other diseases of the musculoskeletal system and connective tissue: Secondary | ICD-10-CM | POA: Insufficient documentation

## 2013-04-23 DIAGNOSIS — Z8701 Personal history of pneumonia (recurrent): Secondary | ICD-10-CM | POA: Insufficient documentation

## 2013-04-23 DIAGNOSIS — Y92410 Unspecified street and highway as the place of occurrence of the external cause: Secondary | ICD-10-CM | POA: Insufficient documentation

## 2013-04-23 DIAGNOSIS — Z862 Personal history of diseases of the blood and blood-forming organs and certain disorders involving the immune mechanism: Secondary | ICD-10-CM | POA: Insufficient documentation

## 2013-04-23 NOTE — ED Notes (Signed)
Pt ambulated to BR with 1 person assist and steady gait. No SOB or c/o pain
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
## 2013-04-23 NOTE — ED Notes (Signed)
NAD noted at time of d/c home with daughter.
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
## 2013-04-24 ENCOUNTER — Encounter (HOSPITAL_COMMUNITY): Payer: Self-pay | Admitting: Emergency Medicine

## 2013-04-24 ENCOUNTER — Emergency Department (HOSPITAL_COMMUNITY)
Admission: EM | Admit: 2013-04-24 | Discharge: 2013-04-24 | Disposition: A | Discharge status: Home / Self Care | Payer: Medicare Other | Attending: Emergency Medicine | Admitting: Emergency Medicine

## 2013-04-24 ENCOUNTER — Emergency Department (HOSPITAL_COMMUNITY): Payer: Medicare Other

## 2013-04-24 DIAGNOSIS — Y929 Unspecified place or not applicable: Secondary | ICD-10-CM | POA: Insufficient documentation

## 2013-04-24 DIAGNOSIS — M25569 Pain in unspecified knee: Secondary | ICD-10-CM | POA: Diagnosis not present

## 2013-04-24 DIAGNOSIS — E039 Hypothyroidism, unspecified: Secondary | ICD-10-CM | POA: Diagnosis not present

## 2013-04-24 DIAGNOSIS — S20219A Contusion of unspecified front wall of thorax, initial encounter: Secondary | ICD-10-CM | POA: Insufficient documentation

## 2013-04-24 DIAGNOSIS — Z8669 Personal history of other diseases of the nervous system and sense organs: Secondary | ICD-10-CM | POA: Diagnosis not present

## 2013-04-24 DIAGNOSIS — Z8709 Personal history of other diseases of the respiratory system: Secondary | ICD-10-CM | POA: Insufficient documentation

## 2013-04-24 DIAGNOSIS — Z8719 Personal history of other diseases of the digestive system: Secondary | ICD-10-CM | POA: Insufficient documentation

## 2013-04-24 DIAGNOSIS — Z79899 Other long term (current) drug therapy: Secondary | ICD-10-CM | POA: Diagnosis not present

## 2013-04-24 DIAGNOSIS — Z96659 Presence of unspecified artificial knee joint: Secondary | ICD-10-CM | POA: Insufficient documentation

## 2013-04-24 DIAGNOSIS — S99929A Unspecified injury of unspecified foot, initial encounter: Secondary | ICD-10-CM | POA: Diagnosis not present

## 2013-04-24 DIAGNOSIS — S8000XA Contusion of unspecified knee, initial encounter: Secondary | ICD-10-CM | POA: Diagnosis not present

## 2013-04-24 DIAGNOSIS — Z8701 Personal history of pneumonia (recurrent): Secondary | ICD-10-CM | POA: Diagnosis not present

## 2013-04-24 DIAGNOSIS — Z8739 Personal history of other diseases of the musculoskeletal system and connective tissue: Secondary | ICD-10-CM | POA: Diagnosis not present

## 2013-04-24 DIAGNOSIS — Z87828 Personal history of other (healed) physical injury and trauma: Secondary | ICD-10-CM | POA: Diagnosis not present

## 2013-04-24 DIAGNOSIS — F341 Dysthymic disorder: Secondary | ICD-10-CM | POA: Insufficient documentation

## 2013-04-24 DIAGNOSIS — Z87891 Personal history of nicotine dependence: Secondary | ICD-10-CM | POA: Insufficient documentation

## 2013-04-24 DIAGNOSIS — Z9181 History of falling: Secondary | ICD-10-CM | POA: Diagnosis not present

## 2013-04-24 DIAGNOSIS — S40029A Contusion of unspecified upper arm, initial encounter: Secondary | ICD-10-CM | POA: Diagnosis not present

## 2013-04-24 DIAGNOSIS — R52 Pain, unspecified: Secondary | ICD-10-CM | POA: Diagnosis not present

## 2013-04-24 DIAGNOSIS — R296 Repeated falls: Secondary | ICD-10-CM | POA: Insufficient documentation

## 2013-04-24 DIAGNOSIS — W06XXXA Fall from bed, initial encounter: Secondary | ICD-10-CM | POA: Insufficient documentation

## 2013-04-24 DIAGNOSIS — Z86718 Personal history of other venous thrombosis and embolism: Secondary | ICD-10-CM | POA: Insufficient documentation

## 2013-04-24 DIAGNOSIS — Y9389 Activity, other specified: Secondary | ICD-10-CM | POA: Insufficient documentation

## 2013-04-24 DIAGNOSIS — S8002XA Contusion of left knee, initial encounter: Secondary | ICD-10-CM

## 2013-04-24 DIAGNOSIS — Z8679 Personal history of other diseases of the circulatory system: Secondary | ICD-10-CM | POA: Insufficient documentation

## 2013-04-24 DIAGNOSIS — IMO0002 Reserved for concepts with insufficient information to code with codable children: Secondary | ICD-10-CM | POA: Diagnosis not present

## 2013-04-24 LAB — CBC WITH DIFFERENTIAL/PLATELET
Basophils Absolute: 0 10*3/uL (ref 0.0–0.1)
Eosinophils Absolute: 0.4 10*3/uL (ref 0.0–0.7)
Lymphocytes Relative: 12 % (ref 12–46)
MCHC: 33.7 g/dL (ref 30.0–36.0)
Monocytes Relative: 7 % (ref 3–12)
Platelets: 216 10*3/uL (ref 150–400)
RDW: 14.1 % (ref 11.5–15.5)
WBC: 8.9 10*3/uL (ref 4.0–10.5)

## 2013-04-24 LAB — BASIC METABOLIC PANEL
BUN: 12 mg/dL (ref 6–23)
Chloride: 103 mEq/L (ref 96–112)
Creatinine, Ser: 0.83 mg/dL (ref 0.50–1.10)
GFR calc Af Amer: 78 mL/min — ABNORMAL LOW (ref >90)
GFR calc non Af Amer: 67 mL/min — ABNORMAL LOW (ref >90)
Potassium: 3.8 mEq/L (ref 3.5–5.1)
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
## 2013-04-30 DIAGNOSIS — J449 Chronic obstructive pulmonary disease, unspecified: Secondary | ICD-10-CM | POA: Diagnosis not present

## 2013-04-30 DIAGNOSIS — R279 Unspecified lack of coordination: Secondary | ICD-10-CM | POA: Diagnosis not present

## 2013-04-30 DIAGNOSIS — I10 Essential (primary) hypertension: Secondary | ICD-10-CM | POA: Diagnosis not present

## 2013-04-30 DIAGNOSIS — M1990 Unspecified osteoarthritis, unspecified site: Secondary | ICD-10-CM | POA: Diagnosis not present

## 2013-04-30 DIAGNOSIS — R269 Unspecified abnormalities of gait and mobility: Secondary | ICD-10-CM | POA: Diagnosis not present

## 2013-04-30 DIAGNOSIS — IMO0001 Reserved for inherently not codable concepts without codable children: Secondary | ICD-10-CM | POA: Diagnosis not present

## 2013-05-02 DIAGNOSIS — M1990 Unspecified osteoarthritis, unspecified site: Secondary | ICD-10-CM | POA: Diagnosis not present

## 2013-05-02 DIAGNOSIS — IMO0001 Reserved for inherently not codable concepts without codable children: Secondary | ICD-10-CM | POA: Diagnosis not present

## 2013-05-02 DIAGNOSIS — J449 Chronic obstructive pulmonary disease, unspecified: Secondary | ICD-10-CM | POA: Diagnosis not present

## 2013-05-02 DIAGNOSIS — I10 Essential (primary) hypertension: Secondary | ICD-10-CM | POA: Diagnosis not present

## 2013-05-02 DIAGNOSIS — R269 Unspecified abnormalities of gait and mobility: Secondary | ICD-10-CM | POA: Diagnosis not present

## 2013-05-02 DIAGNOSIS — R279 Unspecified lack of coordination: Secondary | ICD-10-CM | POA: Diagnosis not present

## 2013-05-06 DIAGNOSIS — IMO0001 Reserved for inherently not codable concepts without codable children: Secondary | ICD-10-CM | POA: Diagnosis not present

## 2013-05-06 DIAGNOSIS — R269 Unspecified abnormalities of gait and mobility: Secondary | ICD-10-CM | POA: Diagnosis not present

## 2013-05-06 DIAGNOSIS — J449 Chronic obstructive pulmonary disease, unspecified: Secondary | ICD-10-CM | POA: Diagnosis not present

## 2013-05-06 DIAGNOSIS — R279 Unspecified lack of coordination: Secondary | ICD-10-CM | POA: Diagnosis not present

## 2013-05-06 DIAGNOSIS — M1990 Unspecified osteoarthritis, unspecified site: Secondary | ICD-10-CM | POA: Diagnosis not present

## 2013-05-06 DIAGNOSIS — I10 Essential (primary) hypertension: Secondary | ICD-10-CM | POA: Diagnosis not present

## 2013-05-07 DIAGNOSIS — M545 Low back pain: Secondary | ICD-10-CM | POA: Diagnosis not present

## 2013-05-07 DIAGNOSIS — F329 Major depressive disorder, single episode, unspecified: Secondary | ICD-10-CM | POA: Diagnosis not present

## 2013-05-07 DIAGNOSIS — I10 Essential (primary) hypertension: Secondary | ICD-10-CM | POA: Diagnosis not present

## 2013-05-07 DIAGNOSIS — E039 Hypothyroidism, unspecified: Secondary | ICD-10-CM | POA: Diagnosis not present

## 2013-05-09 DIAGNOSIS — J449 Chronic obstructive pulmonary disease, unspecified: Secondary | ICD-10-CM | POA: Diagnosis not present

## 2013-05-09 DIAGNOSIS — R269 Unspecified abnormalities of gait and mobility: Secondary | ICD-10-CM | POA: Diagnosis not present

## 2013-05-09 DIAGNOSIS — I10 Essential (primary) hypertension: Secondary | ICD-10-CM | POA: Diagnosis not present

## 2013-05-09 DIAGNOSIS — IMO0001 Reserved for inherently not codable concepts without codable children: Secondary | ICD-10-CM | POA: Diagnosis not present

## 2013-05-09 DIAGNOSIS — R279 Unspecified lack of coordination: Secondary | ICD-10-CM | POA: Diagnosis not present

## 2013-05-09 DIAGNOSIS — M1990 Unspecified osteoarthritis, unspecified site: Secondary | ICD-10-CM | POA: Diagnosis not present

## 2013-05-12 DIAGNOSIS — J449 Chronic obstructive pulmonary disease, unspecified: Secondary | ICD-10-CM | POA: Diagnosis not present

## 2013-05-12 DIAGNOSIS — M25569 Pain in unspecified knee: Secondary | ICD-10-CM | POA: Diagnosis not present

## 2013-05-12 DIAGNOSIS — R269 Unspecified abnormalities of gait and mobility: Secondary | ICD-10-CM | POA: Diagnosis not present

## 2013-05-12 DIAGNOSIS — I10 Essential (primary) hypertension: Secondary | ICD-10-CM | POA: Diagnosis not present

## 2013-05-12 DIAGNOSIS — M159 Polyosteoarthritis, unspecified: Secondary | ICD-10-CM | POA: Diagnosis not present

## 2013-05-12 DIAGNOSIS — IMO0001 Reserved for inherently not codable concepts without codable children: Secondary | ICD-10-CM | POA: Diagnosis not present

## 2013-05-13 DIAGNOSIS — M25569 Pain in unspecified knee: Secondary | ICD-10-CM | POA: Diagnosis not present

## 2013-05-13 DIAGNOSIS — IMO0001 Reserved for inherently not codable concepts without codable children: Secondary | ICD-10-CM | POA: Diagnosis not present

## 2013-05-13 DIAGNOSIS — I10 Essential (primary) hypertension: Secondary | ICD-10-CM | POA: Diagnosis not present

## 2013-05-13 DIAGNOSIS — R269 Unspecified abnormalities of gait and mobility: Secondary | ICD-10-CM | POA: Diagnosis not present

## 2013-05-13 DIAGNOSIS — J449 Chronic obstructive pulmonary disease, unspecified: Secondary | ICD-10-CM | POA: Diagnosis not present

## 2013-05-13 DIAGNOSIS — M159 Polyosteoarthritis, unspecified: Secondary | ICD-10-CM | POA: Diagnosis not present

## 2013-05-16 DIAGNOSIS — IMO0001 Reserved for inherently not codable concepts without codable children: Secondary | ICD-10-CM | POA: Diagnosis not present

## 2013-05-16 DIAGNOSIS — I10 Essential (primary) hypertension: Secondary | ICD-10-CM | POA: Diagnosis not present

## 2013-05-16 DIAGNOSIS — R269 Unspecified abnormalities of gait and mobility: Secondary | ICD-10-CM | POA: Diagnosis not present

## 2013-05-16 DIAGNOSIS — M25569 Pain in unspecified knee: Secondary | ICD-10-CM | POA: Diagnosis not present

## 2013-05-16 DIAGNOSIS — M159 Polyosteoarthritis, unspecified: Secondary | ICD-10-CM | POA: Diagnosis not present

## 2013-05-16 DIAGNOSIS — J449 Chronic obstructive pulmonary disease, unspecified: Secondary | ICD-10-CM | POA: Diagnosis not present

## 2013-05-20 DIAGNOSIS — F325 Major depressive disorder, single episode, in full remission: Secondary | ICD-10-CM | POA: Diagnosis not present

## 2013-05-21 DIAGNOSIS — M159 Polyosteoarthritis, unspecified: Secondary | ICD-10-CM | POA: Diagnosis not present

## 2013-05-21 DIAGNOSIS — IMO0001 Reserved for inherently not codable concepts without codable children: Secondary | ICD-10-CM | POA: Diagnosis not present

## 2013-05-21 DIAGNOSIS — M4180 Other forms of scoliosis, site unspecified: Secondary | ICD-10-CM | POA: Diagnosis not present

## 2013-05-21 DIAGNOSIS — M25569 Pain in unspecified knee: Secondary | ICD-10-CM | POA: Diagnosis not present

## 2013-05-21 DIAGNOSIS — R269 Unspecified abnormalities of gait and mobility: Secondary | ICD-10-CM | POA: Diagnosis not present

## 2013-05-21 DIAGNOSIS — J449 Chronic obstructive pulmonary disease, unspecified: Secondary | ICD-10-CM | POA: Diagnosis not present

## 2013-05-21 DIAGNOSIS — M545 Low back pain: Secondary | ICD-10-CM | POA: Diagnosis not present

## 2013-05-21 DIAGNOSIS — I10 Essential (primary) hypertension: Secondary | ICD-10-CM | POA: Diagnosis not present

## 2013-05-26 DIAGNOSIS — F325 Major depressive disorder, single episode, in full remission: Secondary | ICD-10-CM | POA: Diagnosis not present

## 2013-05-27 DIAGNOSIS — IMO0001 Reserved for inherently not codable concepts without codable children: Secondary | ICD-10-CM | POA: Diagnosis not present

## 2013-05-27 DIAGNOSIS — R269 Unspecified abnormalities of gait and mobility: Secondary | ICD-10-CM | POA: Diagnosis not present

## 2013-05-27 DIAGNOSIS — I10 Essential (primary) hypertension: Secondary | ICD-10-CM | POA: Diagnosis not present

## 2013-05-27 DIAGNOSIS — M159 Polyosteoarthritis, unspecified: Secondary | ICD-10-CM | POA: Diagnosis not present

## 2013-05-27 DIAGNOSIS — M25569 Pain in unspecified knee: Secondary | ICD-10-CM | POA: Diagnosis not present

## 2013-05-27 DIAGNOSIS — J449 Chronic obstructive pulmonary disease, unspecified: Secondary | ICD-10-CM | POA: Diagnosis not present

## 2013-05-29 DIAGNOSIS — IMO0001 Reserved for inherently not codable concepts without codable children: Secondary | ICD-10-CM | POA: Diagnosis not present

## 2013-05-29 DIAGNOSIS — I10 Essential (primary) hypertension: Secondary | ICD-10-CM | POA: Diagnosis not present

## 2013-05-29 DIAGNOSIS — J449 Chronic obstructive pulmonary disease, unspecified: Secondary | ICD-10-CM | POA: Diagnosis not present

## 2013-05-29 DIAGNOSIS — M159 Polyosteoarthritis, unspecified: Secondary | ICD-10-CM | POA: Diagnosis not present

## 2013-05-29 DIAGNOSIS — M25569 Pain in unspecified knee: Secondary | ICD-10-CM | POA: Diagnosis not present

## 2013-05-29 DIAGNOSIS — R269 Unspecified abnormalities of gait and mobility: Secondary | ICD-10-CM | POA: Diagnosis not present

## 2013-05-30 DIAGNOSIS — M47817 Spondylosis without myelopathy or radiculopathy, lumbosacral region: Secondary | ICD-10-CM | POA: Diagnosis not present

## 2013-05-30 DIAGNOSIS — M47814 Spondylosis without myelopathy or radiculopathy, thoracic region: Secondary | ICD-10-CM | POA: Diagnosis not present

## 2013-05-31 DIAGNOSIS — M47817 Spondylosis without myelopathy or radiculopathy, lumbosacral region: Secondary | ICD-10-CM | POA: Diagnosis not present

## 2013-05-31 DIAGNOSIS — M47814 Spondylosis without myelopathy or radiculopathy, thoracic region: Secondary | ICD-10-CM | POA: Diagnosis not present

## 2013-06-02 DIAGNOSIS — M4712 Other spondylosis with myelopathy, cervical region: Secondary | ICD-10-CM | POA: Diagnosis not present

## 2013-06-02 DIAGNOSIS — M545 Low back pain: Secondary | ICD-10-CM | POA: Diagnosis not present

## 2013-06-03 DIAGNOSIS — IMO0001 Reserved for inherently not codable concepts without codable children: Secondary | ICD-10-CM | POA: Diagnosis not present

## 2013-06-03 DIAGNOSIS — R269 Unspecified abnormalities of gait and mobility: Secondary | ICD-10-CM | POA: Diagnosis not present

## 2013-06-03 DIAGNOSIS — M25569 Pain in unspecified knee: Secondary | ICD-10-CM | POA: Diagnosis not present

## 2013-06-03 DIAGNOSIS — J449 Chronic obstructive pulmonary disease, unspecified: Secondary | ICD-10-CM | POA: Diagnosis not present

## 2013-06-03 DIAGNOSIS — I10 Essential (primary) hypertension: Secondary | ICD-10-CM | POA: Diagnosis not present

## 2013-06-03 DIAGNOSIS — M159 Polyosteoarthritis, unspecified: Secondary | ICD-10-CM | POA: Diagnosis not present

## 2013-06-06 DIAGNOSIS — M25569 Pain in unspecified knee: Secondary | ICD-10-CM | POA: Diagnosis not present

## 2013-06-06 DIAGNOSIS — J449 Chronic obstructive pulmonary disease, unspecified: Secondary | ICD-10-CM | POA: Diagnosis not present

## 2013-06-06 DIAGNOSIS — M159 Polyosteoarthritis, unspecified: Secondary | ICD-10-CM | POA: Diagnosis not present

## 2013-06-06 DIAGNOSIS — R269 Unspecified abnormalities of gait and mobility: Secondary | ICD-10-CM | POA: Diagnosis not present

## 2013-06-06 DIAGNOSIS — IMO0001 Reserved for inherently not codable concepts without codable children: Secondary | ICD-10-CM | POA: Diagnosis not present

## 2013-06-06 DIAGNOSIS — I10 Essential (primary) hypertension: Secondary | ICD-10-CM | POA: Diagnosis not present

## 2013-06-10 DIAGNOSIS — R269 Unspecified abnormalities of gait and mobility: Secondary | ICD-10-CM | POA: Diagnosis not present

## 2013-06-10 DIAGNOSIS — M159 Polyosteoarthritis, unspecified: Secondary | ICD-10-CM | POA: Diagnosis not present

## 2013-06-10 DIAGNOSIS — M25569 Pain in unspecified knee: Secondary | ICD-10-CM | POA: Diagnosis not present

## 2013-06-10 DIAGNOSIS — I10 Essential (primary) hypertension: Secondary | ICD-10-CM | POA: Diagnosis not present

## 2013-06-10 DIAGNOSIS — J449 Chronic obstructive pulmonary disease, unspecified: Secondary | ICD-10-CM | POA: Diagnosis not present

## 2013-06-10 DIAGNOSIS — IMO0001 Reserved for inherently not codable concepts without codable children: Secondary | ICD-10-CM | POA: Diagnosis not present

## 2013-06-11 DIAGNOSIS — M47812 Spondylosis without myelopathy or radiculopathy, cervical region: Secondary | ICD-10-CM | POA: Diagnosis not present

## 2013-06-11 DIAGNOSIS — F325 Major depressive disorder, single episode, in full remission: Secondary | ICD-10-CM | POA: Diagnosis not present

## 2013-06-17 DIAGNOSIS — R269 Unspecified abnormalities of gait and mobility: Secondary | ICD-10-CM | POA: Diagnosis not present

## 2013-06-17 DIAGNOSIS — J449 Chronic obstructive pulmonary disease, unspecified: Secondary | ICD-10-CM | POA: Diagnosis not present

## 2013-06-17 DIAGNOSIS — M25569 Pain in unspecified knee: Secondary | ICD-10-CM | POA: Diagnosis not present

## 2013-06-17 DIAGNOSIS — M159 Polyosteoarthritis, unspecified: Secondary | ICD-10-CM | POA: Diagnosis not present

## 2013-06-17 DIAGNOSIS — IMO0001 Reserved for inherently not codable concepts without codable children: Secondary | ICD-10-CM | POA: Diagnosis not present

## 2013-06-17 DIAGNOSIS — I10 Essential (primary) hypertension: Secondary | ICD-10-CM | POA: Diagnosis not present

## 2013-06-19 DIAGNOSIS — I10 Essential (primary) hypertension: Secondary | ICD-10-CM | POA: Diagnosis not present

## 2013-06-19 DIAGNOSIS — M159 Polyosteoarthritis, unspecified: Secondary | ICD-10-CM | POA: Diagnosis not present

## 2013-06-19 DIAGNOSIS — M25569 Pain in unspecified knee: Secondary | ICD-10-CM | POA: Diagnosis not present

## 2013-06-19 DIAGNOSIS — IMO0001 Reserved for inherently not codable concepts without codable children: Secondary | ICD-10-CM | POA: Diagnosis not present

## 2013-06-19 DIAGNOSIS — R269 Unspecified abnormalities of gait and mobility: Secondary | ICD-10-CM | POA: Diagnosis not present

## 2013-06-19 DIAGNOSIS — J449 Chronic obstructive pulmonary disease, unspecified: Secondary | ICD-10-CM | POA: Diagnosis not present

## 2013-06-20 DIAGNOSIS — M4712 Other spondylosis with myelopathy, cervical region: Secondary | ICD-10-CM | POA: Diagnosis not present

## 2013-06-23 ENCOUNTER — Other Ambulatory Visit: Payer: Self-pay | Admitting: Orthopedic Surgery

## 2013-06-23 DIAGNOSIS — F325 Major depressive disorder, single episode, in full remission: Secondary | ICD-10-CM | POA: Diagnosis not present

## 2013-06-24 DIAGNOSIS — M159 Polyosteoarthritis, unspecified: Secondary | ICD-10-CM | POA: Diagnosis not present

## 2013-06-24 DIAGNOSIS — M25569 Pain in unspecified knee: Secondary | ICD-10-CM | POA: Diagnosis not present

## 2013-06-24 DIAGNOSIS — R269 Unspecified abnormalities of gait and mobility: Secondary | ICD-10-CM | POA: Diagnosis not present

## 2013-06-24 DIAGNOSIS — I10 Essential (primary) hypertension: Secondary | ICD-10-CM | POA: Diagnosis not present

## 2013-06-24 DIAGNOSIS — IMO0001 Reserved for inherently not codable concepts without codable children: Secondary | ICD-10-CM | POA: Diagnosis not present

## 2013-06-24 DIAGNOSIS — J449 Chronic obstructive pulmonary disease, unspecified: Secondary | ICD-10-CM | POA: Diagnosis not present

## 2013-06-27 DIAGNOSIS — R269 Unspecified abnormalities of gait and mobility: Secondary | ICD-10-CM | POA: Diagnosis not present

## 2013-06-27 DIAGNOSIS — M25569 Pain in unspecified knee: Secondary | ICD-10-CM | POA: Diagnosis not present

## 2013-06-27 DIAGNOSIS — IMO0001 Reserved for inherently not codable concepts without codable children: Secondary | ICD-10-CM | POA: Diagnosis not present

## 2013-06-27 DIAGNOSIS — M159 Polyosteoarthritis, unspecified: Secondary | ICD-10-CM | POA: Diagnosis not present

## 2013-06-27 DIAGNOSIS — I10 Essential (primary) hypertension: Secondary | ICD-10-CM | POA: Diagnosis not present

## 2013-06-27 DIAGNOSIS — J449 Chronic obstructive pulmonary disease, unspecified: Secondary | ICD-10-CM | POA: Diagnosis not present

## 2013-06-30 ENCOUNTER — Encounter (HOSPITAL_COMMUNITY): Payer: Self-pay | Admitting: Pharmacy Technician

## 2013-07-01 ENCOUNTER — Other Ambulatory Visit: Payer: Self-pay | Admitting: Orthopedic Surgery

## 2013-07-01 DIAGNOSIS — I10 Essential (primary) hypertension: Secondary | ICD-10-CM | POA: Diagnosis not present

## 2013-07-01 DIAGNOSIS — M25569 Pain in unspecified knee: Secondary | ICD-10-CM | POA: Diagnosis not present

## 2013-07-01 DIAGNOSIS — R269 Unspecified abnormalities of gait and mobility: Secondary | ICD-10-CM | POA: Diagnosis not present

## 2013-07-01 DIAGNOSIS — IMO0001 Reserved for inherently not codable concepts without codable children: Secondary | ICD-10-CM | POA: Diagnosis not present

## 2013-07-01 DIAGNOSIS — J449 Chronic obstructive pulmonary disease, unspecified: Secondary | ICD-10-CM | POA: Diagnosis not present

## 2013-07-01 DIAGNOSIS — M159 Polyosteoarthritis, unspecified: Secondary | ICD-10-CM | POA: Diagnosis not present

## 2013-07-02 NOTE — Pre-Procedure Instructions (Signed)
Andrea [name]  07/02/2013   Your procedure is scheduled on:  July 09, 2013  Report to [facility] at 10:00 AM.  Call this number if you have problems the morning of surgery: [phone]   Remember:   Do not eat food or drink liquids after midnight.   Take these medicines the morning of surgery with A SIP OF WATER: buPROPion (WELLBUTRIN XL), clonazePAM (KLONOPIN) or diazepam (VALIUM) - if needed for anxiety, DULoxetine (CYMBALTA),   levothyroxine (SYNTHROID, LEVOTHROID), oxyCODONE-acetaminophen (PERCOCET) - if needed for pain  Stop all Vitamins, Aspirin, Non-steroidals, herbal medications as of today 07/03/13       Do not wear jewelry, make-up or nail polish.  Do not wear lotions, powders, or perfumes. You may wear deodorant.  Do not shave 48 hours prior to surgery.   Do not bring valuables to the hospital.  [facility] is not responsible                   for any belongings or valuables.  Contacts, dentures or bridgework may not be worn into surgery.  Leave suitcase in the car. After surgery it may be brought to your room.  For patients admitted to the hospital, checkout time is 11:00 AM the day of  discharge.    Special Instructions: Shower using CHG 2 nights before surgery and the night before surgery.  If you shower the day of surgery use CHG.  Use special wash - you have one bottle of CHG for all showers.  You should use approximately 1/3 of the bottle for each shower.   Please read over the following fact sheets that you were given: Pain Booklet, Coughing and Deep Breathing, Blood Transfusion Information, MRSA Information and Surgical Site Infection Prevention

## 2013-07-03 ENCOUNTER — Encounter (HOSPITAL_COMMUNITY): Payer: Self-pay

## 2013-07-03 ENCOUNTER — Encounter (HOSPITAL_COMMUNITY)
Admission: RE | Admit: 2013-07-03 | Discharge: 2013-07-03 | Disposition: A | Discharge status: Home / Self Care | Payer: Medicare Other | Source: Ambulatory Visit | Attending: Orthopedic Surgery | Admitting: Orthopedic Surgery

## 2013-07-03 DIAGNOSIS — R6889 Other general symptoms and signs: Secondary | ICD-10-CM | POA: Diagnosis not present

## 2013-07-03 DIAGNOSIS — F411 Generalized anxiety disorder: Secondary | ICD-10-CM | POA: Diagnosis not present

## 2013-07-03 DIAGNOSIS — Z01818 Encounter for other preprocedural examination: Secondary | ICD-10-CM | POA: Diagnosis not present

## 2013-07-03 DIAGNOSIS — Z01812 Encounter for preprocedural laboratory examination: Secondary | ICD-10-CM | POA: Diagnosis not present

## 2013-07-03 DIAGNOSIS — I10 Essential (primary) hypertension: Secondary | ICD-10-CM | POA: Diagnosis not present

## 2013-07-03 HISTORY — DX: Personal history of other medical treatment: Z92.89

## 2013-07-03 HISTORY — DX: Repeated falls: R29.6

## 2013-07-03 LAB — APTT: aPTT: 25 seconds (ref 24–37)

## 2013-07-03 LAB — CBC WITH DIFFERENTIAL/PLATELET
Basophils Relative: 1 % (ref 0–1)
Eosinophils Relative: 5 % (ref 0–5)
HCT: 38.3 % (ref 36.0–46.0)
Hemoglobin: 12.9 g/dL (ref 12.0–15.0)
MCH: 29.1 pg (ref 26.0–34.0)
MCHC: 33.7 g/dL (ref 30.0–36.0)
MCV: 86.3 fL (ref 78.0–100.0)
Monocytes Absolute: 0.4 10*3/uL (ref 0.1–1.0)
Monocytes Relative: 6 % (ref 3–12)
Neutro Abs: 4.4 10*3/uL (ref 1.7–7.7)
RDW: 13.9 % (ref 11.5–15.5)

## 2013-07-03 LAB — PROTIME-INR
INR: 0.97 (ref 0.00–1.49)
Prothrombin Time: 12.7 seconds (ref 11.6–15.2)

## 2013-07-03 LAB — COMPREHENSIVE METABOLIC PANEL
Albumin: 3.6 g/dL (ref 3.5–5.2)
BUN: 24 mg/dL — ABNORMAL HIGH (ref 6–23)
Calcium: 9.1 mg/dL (ref 8.4–10.5)
Chloride: 105 mEq/L (ref 96–112)
Creatinine, Ser: 0.88 mg/dL (ref 0.50–1.10)
GFR calc non Af Amer: 62 mL/min — ABNORMAL LOW (ref >90)
Total Bilirubin: 0.3 mg/dL (ref 0.3–1.2)

## 2013-07-03 LAB — SURGICAL PCR SCREEN
MRSA, PCR: NEGATIVE
Staphylococcus aureus: NEGATIVE

## 2013-07-03 LAB — ABO/RH: ABO/RH(D): A POS

## 2013-07-03 LAB — TYPE AND SCREEN: ABO/RH(D): A POS

## 2013-07-03 NOTE — Progress Notes (Signed)
Pt. Followed by B. Reese,MD for PCP, pt. Reports that she had a stress test 15 + yrs. Ago, no need for follow up /w cardiac since then.  Reports Stress test was WNL.

## 2013-07-04 DIAGNOSIS — IMO0001 Reserved for inherently not codable concepts without codable children: Secondary | ICD-10-CM | POA: Diagnosis not present

## 2013-07-04 DIAGNOSIS — M159 Polyosteoarthritis, unspecified: Secondary | ICD-10-CM | POA: Diagnosis not present

## 2013-07-04 DIAGNOSIS — J449 Chronic obstructive pulmonary disease, unspecified: Secondary | ICD-10-CM | POA: Diagnosis not present

## 2013-07-04 DIAGNOSIS — R269 Unspecified abnormalities of gait and mobility: Secondary | ICD-10-CM | POA: Diagnosis not present

## 2013-07-04 DIAGNOSIS — M25569 Pain in unspecified knee: Secondary | ICD-10-CM | POA: Diagnosis not present

## 2013-07-04 DIAGNOSIS — I10 Essential (primary) hypertension: Secondary | ICD-10-CM | POA: Diagnosis not present

## 2013-07-08 DIAGNOSIS — M25549 Pain in joints of unspecified hand: Secondary | ICD-10-CM | POA: Diagnosis not present

## 2013-07-08 MED ORDER — CEFAZOLIN SODIUM-DEXTROSE 2-3 GM-% IV SOLR
2.0000 g | INTRAVENOUS | Status: AC
  Administered 2013-07-09: 2 g via INTRAVENOUS
  Filled 2013-07-08: qty 50

## 2013-07-08 MED ORDER — POVIDONE-IODINE 7.5 % EX SOLN
Freq: Once | CUTANEOUS | Status: DC
  Filled 2013-07-08: qty 118

## 2013-07-08 NOTE — Progress Notes (Signed)
Pt notified to arrive at 1010;verbalized understanding

## 2013-07-09 ENCOUNTER — Inpatient Hospital Stay (HOSPITAL_COMMUNITY): Payer: Medicare Other

## 2013-07-09 ENCOUNTER — Inpatient Hospital Stay (HOSPITAL_COMMUNITY)
Admission: RE | Admit: 2013-07-09 | Discharge: 2013-07-12 | DRG: 472 | Disposition: A | Discharge status: Skilled Nursing Facility | Payer: Medicare Other | Source: Ambulatory Visit | Attending: Orthopedic Surgery | Admitting: Orthopedic Surgery

## 2013-07-09 ENCOUNTER — Encounter (HOSPITAL_COMMUNITY): Payer: Self-pay | Admitting: *Deleted

## 2013-07-09 ENCOUNTER — Encounter (HOSPITAL_COMMUNITY): Payer: Self-pay | Admitting: Certified Registered"

## 2013-07-09 ENCOUNTER — Inpatient Hospital Stay (HOSPITAL_COMMUNITY): Payer: Medicare Other | Admitting: Certified Registered"

## 2013-07-09 ENCOUNTER — Encounter (HOSPITAL_COMMUNITY)
Admission: RE | Disposition: A | Discharge status: Skilled Nursing Facility | Payer: Self-pay | Source: Ambulatory Visit | Attending: Orthopedic Surgery

## 2013-07-09 DIAGNOSIS — M542 Cervicalgia: Secondary | ICD-10-CM | POA: Diagnosis not present

## 2013-07-09 DIAGNOSIS — Z6841 Body Mass Index (BMI) 40.0 and over, adult: Secondary | ICD-10-CM | POA: Diagnosis not present

## 2013-07-09 DIAGNOSIS — Z86718 Personal history of other venous thrombosis and embolism: Secondary | ICD-10-CM

## 2013-07-09 DIAGNOSIS — Z4789 Encounter for other orthopedic aftercare: Secondary | ICD-10-CM | POA: Diagnosis not present

## 2013-07-09 DIAGNOSIS — Z87891 Personal history of nicotine dependence: Secondary | ICD-10-CM

## 2013-07-09 DIAGNOSIS — Z7982 Long term (current) use of aspirin: Secondary | ICD-10-CM

## 2013-07-09 DIAGNOSIS — F411 Generalized anxiety disorder: Secondary | ICD-10-CM | POA: Diagnosis not present

## 2013-07-09 DIAGNOSIS — G4730 Sleep apnea, unspecified: Secondary | ICD-10-CM | POA: Diagnosis present

## 2013-07-09 DIAGNOSIS — M4712 Other spondylosis with myelopathy, cervical region: Principal | ICD-10-CM | POA: Diagnosis present

## 2013-07-09 DIAGNOSIS — Z23 Encounter for immunization: Secondary | ICD-10-CM | POA: Diagnosis not present

## 2013-07-09 DIAGNOSIS — F341 Dysthymic disorder: Secondary | ICD-10-CM | POA: Diagnosis not present

## 2013-07-09 DIAGNOSIS — M5000 Cervical disc disorder with myelopathy, unspecified cervical region: Secondary | ICD-10-CM | POA: Diagnosis not present

## 2013-07-09 DIAGNOSIS — F329 Major depressive disorder, single episode, unspecified: Secondary | ICD-10-CM | POA: Diagnosis not present

## 2013-07-09 DIAGNOSIS — E039 Hypothyroidism, unspecified: Secondary | ICD-10-CM | POA: Diagnosis present

## 2013-07-09 DIAGNOSIS — Z9181 History of falling: Secondary | ICD-10-CM | POA: Diagnosis not present

## 2013-07-09 DIAGNOSIS — M6281 Muscle weakness (generalized): Secondary | ICD-10-CM | POA: Diagnosis not present

## 2013-07-09 DIAGNOSIS — M159 Polyosteoarthritis, unspecified: Secondary | ICD-10-CM | POA: Diagnosis not present

## 2013-07-09 DIAGNOSIS — R269 Unspecified abnormalities of gait and mobility: Secondary | ICD-10-CM | POA: Diagnosis not present

## 2013-07-09 DIAGNOSIS — Z79899 Other long term (current) drug therapy: Secondary | ICD-10-CM | POA: Diagnosis not present

## 2013-07-09 HISTORY — PX: ANTERIOR CERVICAL DECOMP/DISCECTOMY FUSION: SHX1161

## 2013-07-09 HISTORY — DX: Unspecified fracture of right wrist and hand, initial encounter for closed fracture: S62.91XA

## 2013-07-09 SURGERY — ANTERIOR CERVICAL DECOMPRESSION/DISCECTOMY FUSION 3 LEVELS
Anesthesia: General | Site: Neck | Wound class: Clean

## 2013-07-09 MED ORDER — FLEET ENEMA 7-19 GM/118ML RE ENEM: 1.0000 | ENEMA | Freq: Once | RECTAL | Status: AC | PRN

## 2013-07-09 MED ORDER — ONDANSETRON HCL 4 MG/2ML IJ SOLN: 4.0000 mg | INTRAMUSCULAR | Status: DC | PRN

## 2013-07-09 MED ORDER — FENTANYL CITRATE 0.05 MG/ML IJ SOLN
INTRAMUSCULAR | Status: DC | PRN
  Administered 2013-07-09: 50 ug via INTRAVENOUS
  Administered 2013-07-09: 25 ug via INTRAVENOUS
  Administered 2013-07-09 (×2): 50 ug via INTRAVENOUS
  Administered 2013-07-09: 100 ug via INTRAVENOUS
  Administered 2013-07-09: 50 ug via INTRAVENOUS
  Administered 2013-07-09: 25 ug via INTRAVENOUS
  Administered 2013-07-09 (×3): 50 ug via INTRAVENOUS

## 2013-07-09 MED ORDER — BISACODYL 5 MG PO TBEC: 5.0000 mg | DELAYED_RELEASE_TABLET | Freq: Every day | ORAL | Status: DC | PRN

## 2013-07-09 MED ORDER — CALCIUM CARBONATE ANTACID 500 MG PO CHEW
3.0000 | CHEWABLE_TABLET | Freq: Every day | ORAL | Status: DC
  Administered 2013-07-09 – 2013-07-12 (×3): 600 mg via ORAL
  Filled 2013-07-09 (×4): qty 3

## 2013-07-09 MED ORDER — DIAZEPAM 5 MG PO TABS
5.0000 mg | ORAL_TABLET | Freq: Four times a day (QID) | ORAL | Status: DC | PRN
  Administered 2013-07-11: 5 mg via ORAL
  Filled 2013-07-09: qty 1

## 2013-07-09 MED ORDER — DULOXETINE HCL 60 MG PO CPEP
120.0000 mg | ORAL_CAPSULE | Freq: Every day | ORAL | Status: DC
  Administered 2013-07-10 – 2013-07-12 (×3): 120 mg via ORAL
  Filled 2013-07-09 (×4): qty 2

## 2013-07-09 MED ORDER — PNEUMOCOCCAL VAC POLYVALENT 25 MCG/0.5ML IJ INJ
0.5000 mL | INJECTION | INTRAMUSCULAR | Status: DC
  Filled 2013-07-09: qty 0.5

## 2013-07-09 MED ORDER — BUPIVACAINE-EPINEPHRINE PF 0.25-1:200000 % IJ SOLN
INTRAMUSCULAR | Status: AC
  Filled 2013-07-09: qty 30

## 2013-07-09 MED ORDER — BUPROPION HCL ER (XL) 300 MG PO TB24
300.0000 mg | ORAL_TABLET | Freq: Every morning | ORAL | Status: DC
  Administered 2013-07-10 – 2013-07-12 (×3): 300 mg via ORAL
  Filled 2013-07-09 (×3): qty 1

## 2013-07-09 MED ORDER — MENTHOL 3 MG MT LOZG: 1.0000 | LOZENGE | OROMUCOSAL | Status: DC | PRN

## 2013-07-09 MED ORDER — LEVOTHYROXINE SODIUM 50 MCG PO TABS
50.0000 ug | ORAL_TABLET | Freq: Every day | ORAL | Status: DC
  Administered 2013-07-10 – 2013-07-12 (×3): 50 ug via ORAL
  Filled 2013-07-09 (×5): qty 1

## 2013-07-09 MED ORDER — ALUM & MAG HYDROXIDE-SIMETH 200-200-20 MG/5ML PO SUSP: 30.0000 mL | Freq: Four times a day (QID) | ORAL | Status: DC | PRN

## 2013-07-09 MED ORDER — CLONAZEPAM 0.5 MG PO TABS
0.5000 mg | ORAL_TABLET | Freq: Three times a day (TID) | ORAL | Status: DC | PRN
  Administered 2013-07-10 – 2013-07-11 (×3): 0.5 mg via ORAL
  Filled 2013-07-09 (×3): qty 1

## 2013-07-09 MED ORDER — VITAMIN D3 125 MCG (5000 UT) PO CAPS: 1.0000 | ORAL_CAPSULE | Freq: Every day | ORAL | Status: DC

## 2013-07-09 MED ORDER — THROMBIN 20000 UNITS EX SOLR
CUTANEOUS | Status: AC
  Filled 2013-07-09: qty 20000

## 2013-07-09 MED ORDER — ACETAMINOPHEN 325 MG PO TABS
650.0000 mg | ORAL_TABLET | ORAL | Status: DC | PRN
  Filled 2013-07-09: qty 2

## 2013-07-09 MED ORDER — DEXAMETHASONE SODIUM PHOSPHATE 10 MG/ML IJ SOLN
INTRAMUSCULAR | Status: DC | PRN
  Administered 2013-07-09: 8 mg via INTRAVENOUS

## 2013-07-09 MED ORDER — ARTIFICIAL TEARS OP OINT
TOPICAL_OINTMENT | OPHTHALMIC | Status: DC | PRN
  Administered 2013-07-09: 1 via OPHTHALMIC

## 2013-07-09 MED ORDER — SODIUM CHLORIDE 0.9 % IJ SOLN
3.0000 mL | Freq: Two times a day (BID) | INTRAMUSCULAR | Status: DC
  Administered 2013-07-10: 3 mL via INTRAVENOUS
  Administered 2013-07-10: 13:00:00 via INTRAVENOUS
  Administered 2013-07-11 – 2013-07-12 (×3): 3 mL via INTRAVENOUS

## 2013-07-09 MED ORDER — ACETAMINOPHEN 650 MG RE SUPP: 650.0000 mg | RECTAL | Status: DC | PRN

## 2013-07-09 MED ORDER — CHOLECALCIFEROL 10 MCG (400 UNIT) PO TABS
400.0000 [IU] | ORAL_TABLET | Freq: Every day | ORAL | Status: DC
  Administered 2013-07-09 – 2013-07-12 (×4): 400 [IU] via ORAL
  Filled 2013-07-09 (×4): qty 1

## 2013-07-09 MED ORDER — ROCURONIUM BROMIDE 100 MG/10ML IV SOLN
INTRAVENOUS | Status: DC | PRN
  Administered 2013-07-09: 10 mg via INTRAVENOUS
  Administered 2013-07-09: 50 mg via INTRAVENOUS
  Administered 2013-07-09: 10 mg via INTRAVENOUS

## 2013-07-09 MED ORDER — PROPOFOL 10 MG/ML IV BOLUS
INTRAVENOUS | Status: DC | PRN
  Administered 2013-07-09: 150 mg via INTRAVENOUS

## 2013-07-09 MED ORDER — CEFAZOLIN SODIUM 1-5 GM-% IV SOLN
1.0000 g | Freq: Three times a day (TID) | INTRAVENOUS | Status: AC
  Administered 2013-07-09 – 2013-07-10 (×2): 1 g via INTRAVENOUS
  Filled 2013-07-09 (×2): qty 50

## 2013-07-09 MED ORDER — SODIUM CHLORIDE 0.9 % IJ SOLN: 3.0000 mL | INTRAMUSCULAR | Status: DC | PRN

## 2013-07-09 MED ORDER — LACTATED RINGERS IV SOLN
INTRAVENOUS | Status: DC
  Administered 2013-07-09: 11:00:00 via INTRAVENOUS

## 2013-07-09 MED ORDER — DOCUSATE SODIUM 100 MG PO CAPS
100.0000 mg | ORAL_CAPSULE | Freq: Two times a day (BID) | ORAL | Status: DC
  Administered 2013-07-09 – 2013-07-12 (×6): 100 mg via ORAL
  Filled 2013-07-09 (×7): qty 1

## 2013-07-09 MED ORDER — HYDROMORPHONE HCL PF 1 MG/ML IJ SOLN: 0.2500 mg | INTRAMUSCULAR | Status: DC | PRN

## 2013-07-09 MED ORDER — PHENOL 1.4 % MT LIQD: 1.0000 | OROMUCOSAL | Status: DC | PRN

## 2013-07-09 MED ORDER — NEOSTIGMINE METHYLSULFATE 1 MG/ML IJ SOLN
INTRAMUSCULAR | Status: DC | PRN
  Administered 2013-07-09: 4 mg via INTRAVENOUS

## 2013-07-09 MED ORDER — THROMBIN 20000 UNITS EX SOLR
CUTANEOUS | Status: DC | PRN
  Administered 2013-07-09: 13:00:00 via TOPICAL

## 2013-07-09 MED ORDER — LABETALOL HCL 5 MG/ML IV SOLN
INTRAVENOUS | Status: DC | PRN
  Administered 2013-07-09 (×3): 5 mg via INTRAVENOUS

## 2013-07-09 MED ORDER — MORPHINE SULFATE 2 MG/ML IJ SOLN
1.0000 mg | INTRAMUSCULAR | Status: DC | PRN
  Administered 2013-07-09: 2 mg via INTRAVENOUS
  Filled 2013-07-09: qty 1

## 2013-07-09 MED ORDER — LIDOCAINE HCL (CARDIAC) 20 MG/ML IV SOLN
INTRAVENOUS | Status: DC | PRN
  Administered 2013-07-09: 30 mg via INTRAVENOUS

## 2013-07-09 MED ORDER — OXYCODONE HCL 5 MG PO TABS: 5.0000 mg | ORAL_TABLET | Freq: Once | ORAL | Status: DC | PRN

## 2013-07-09 MED ORDER — SENNOSIDES-DOCUSATE SODIUM 8.6-50 MG PO TABS: 1.0000 | ORAL_TABLET | Freq: Every evening | ORAL | Status: DC | PRN

## 2013-07-09 MED ORDER — ZOLPIDEM TARTRATE 5 MG PO TABS: 5.0000 mg | ORAL_TABLET | Freq: Every evening | ORAL | Status: DC | PRN

## 2013-07-09 MED ORDER — TEMAZEPAM 15 MG PO CAPS
30.0000 mg | ORAL_CAPSULE | Freq: Every day | ORAL | Status: DC
  Administered 2013-07-10 – 2013-07-11 (×2): 30 mg via ORAL
  Filled 2013-07-09 (×3): qty 2

## 2013-07-09 MED ORDER — ONDANSETRON HCL 4 MG/2ML IJ SOLN
INTRAMUSCULAR | Status: DC | PRN
  Administered 2013-07-09: 4 mg via INTRAVENOUS

## 2013-07-09 MED ORDER — GLYCOPYRROLATE 0.2 MG/ML IJ SOLN
INTRAMUSCULAR | Status: DC | PRN
  Administered 2013-07-09: 0.6 mg via INTRAVENOUS

## 2013-07-09 MED ORDER — OXYCODONE HCL 5 MG/5ML PO SOLN: 5.0000 mg | Freq: Once | ORAL | Status: DC | PRN

## 2013-07-09 MED ORDER — CALCIUM CARBONATE ANTACID 750 MG PO CHEW: 750.0000 mg | CHEWABLE_TABLET | Freq: Every day | ORAL | Status: DC

## 2013-07-09 MED ORDER — OXYCODONE-ACETAMINOPHEN 5-325 MG PO TABS
1.0000 | ORAL_TABLET | ORAL | Status: DC | PRN
  Administered 2013-07-09 – 2013-07-10 (×3): 2 via ORAL
  Filled 2013-07-09: qty 2
  Filled 2013-07-09: qty 1
  Filled 2013-07-09 (×3): qty 2

## 2013-07-09 MED ORDER — BUPIVACAINE-EPINEPHRINE 0.25% -1:200000 IJ SOLN
INTRAMUSCULAR | Status: DC | PRN
  Administered 2013-07-09: 1 mL

## 2013-07-09 MED ORDER — LACTATED RINGERS IV SOLN
INTRAVENOUS | Status: DC | PRN
  Administered 2013-07-09 (×2): via INTRAVENOUS

## 2013-07-09 MED ORDER — SODIUM CHLORIDE 0.9 % IV SOLN: 250.0000 mL | INTRAVENOUS | Status: DC

## 2013-07-09 SURGICAL SUPPLY — 78 items
BENZOIN TINCTURE PRP APPL 2/3 (GAUZE/BANDAGES/DRESSINGS) ×2 IMPLANT
BIT DRILL NEURO 2X3.1 SFT TUCH (MISCELLANEOUS) ×1 IMPLANT
BIT DRILL SKYLINE 12MM (BIT) ×1 IMPLANT
BLADE SURG 15 STRL LF DISP TIS (BLADE) ×1 IMPLANT
BLADE SURG 15 STRL SS (BLADE) ×1
BLADE SURG ROTATE 9660 (MISCELLANEOUS) ×2 IMPLANT
BUR MATCHSTICK NEURO 3.0 LAGG (BURR) IMPLANT
CARTRIDGE OIL MAESTRO DRILL (MISCELLANEOUS) ×1 IMPLANT
CLOSURE STERI-STRIP 1/4X4 (GAUZE/BANDAGES/DRESSINGS) ×2 IMPLANT
CLOTH BEACON ORANGE TIMEOUT ST (SAFETY) ×2 IMPLANT
CORDS BIPOLAR (ELECTRODE) ×2 IMPLANT
COVER SURGICAL LIGHT HANDLE (MISCELLANEOUS) ×2 IMPLANT
CRADLE DONUT ADULT HEAD (MISCELLANEOUS) ×2 IMPLANT
DEVICE ENDSKLTN IMPLNT MED 5MM (Endomechanicals) ×1 IMPLANT
DIFFUSER DRILL AIR PNEUMATIC (MISCELLANEOUS) ×2 IMPLANT
DRAIN JACKSON RD 7FR 3/32 (WOUND CARE) IMPLANT
DRAPE C-ARM 42X72 X-RAY (DRAPES) ×2 IMPLANT
DRAPE POUCH INSTRU U-SHP 10X18 (DRAPES) ×2 IMPLANT
DRAPE SURG 17X23 STRL (DRAPES) ×6 IMPLANT
DRILL BIT SKYLINE 12MM (BIT) ×1
DRILL NEURO 2X3.1 SOFT TOUCH (MISCELLANEOUS) ×2
DURAPREP 26ML APPLICATOR (WOUND CARE) ×2 IMPLANT
ELECT COATED BLADE 2.86 ST (ELECTRODE) ×2 IMPLANT
ELECT REM PT RETURN 9FT ADLT (ELECTROSURGICAL) ×2
ELECTRODE REM PT RTRN 9FT ADLT (ELECTROSURGICAL) ×1 IMPLANT
ENDOSKELETON IMPLANT MED 5MM (Endomechanicals) ×2 IMPLANT
EVACUATOR SILICONE 100CC (DRAIN) IMPLANT
GAUZE SPONGE 4X4 16PLY XRAY LF (GAUZE/BANDAGES/DRESSINGS) ×2 IMPLANT
GLOVE BIO SURGEON STRL SZ7 (GLOVE) ×4 IMPLANT
GLOVE BIO SURGEON STRL SZ8 (GLOVE) ×2 IMPLANT
GLOVE BIOGEL PI IND STRL 6.5 (GLOVE) ×1 IMPLANT
GLOVE BIOGEL PI IND STRL 7.0 (GLOVE) ×3 IMPLANT
GLOVE BIOGEL PI IND STRL 8 (GLOVE) ×1 IMPLANT
GLOVE BIOGEL PI INDICATOR 6.5 (GLOVE) ×1
GLOVE BIOGEL PI INDICATOR 7.0 (GLOVE) ×3
GLOVE BIOGEL PI INDICATOR 8 (GLOVE) ×1
GLOVE SURG SS PI 6.5 STRL IVOR (GLOVE) ×2 IMPLANT
GOWN STRL NON-REIN LRG LVL3 (GOWN DISPOSABLE) ×2 IMPLANT
GOWN STRL REIN XL XLG (GOWN DISPOSABLE) ×2 IMPLANT
INTERLOCK LRDTC CRVCL VBR 6MM (Bone Implant) ×2 IMPLANT
IV CATH 14GX2 1/4 (CATHETERS) ×2 IMPLANT
KIT BASIN OR (CUSTOM PROCEDURE TRAY) ×2 IMPLANT
KIT ROOM TURNOVER OR (KITS) ×2 IMPLANT
LORDOTIC CERVICAL VBR 6MM SM (Bone Implant) ×4 IMPLANT
MANIFOLD NEPTUNE II (INSTRUMENTS) ×2 IMPLANT
NEEDLE 27GAX1X1/2 (NEEDLE) ×2 IMPLANT
NEEDLE SPNL 20GX3.5 QUINCKE YW (NEEDLE) ×2 IMPLANT
NS IRRIG 1000ML POUR BTL (IV SOLUTION) ×2 IMPLANT
OIL CARTRIDGE MAESTRO DRILL (MISCELLANEOUS) ×2
PACK ORTHO CERVICAL (CUSTOM PROCEDURE TRAY) ×2 IMPLANT
PAD ARMBOARD 7.5X6 YLW CONV (MISCELLANEOUS) ×4 IMPLANT
PATTIES SURGICAL .5 X.5 (GAUZE/BANDAGES/DRESSINGS) IMPLANT
PATTIES SURGICAL .5 X1 (DISPOSABLE) IMPLANT
PIN DISTRACTION 14 (PIN) ×4 IMPLANT
PIN TEMP SKYLINE THREADED (PIN) ×2 IMPLANT
PLATE SKYLINE 3LVL 45MM CERV (Plate) ×2 IMPLANT
PUTTY BONE DBX 5CC MIX (Putty) ×2 IMPLANT
SCREW SKYLINE VAR OS 14MM (Screw) ×2 IMPLANT
SCREW VAR SELF TAP SKYLINE 14M (Screw) ×14 IMPLANT
SPONGE GAUZE 4X4 12PLY (GAUZE/BANDAGES/DRESSINGS) ×2 IMPLANT
SPONGE INTESTINAL PEANUT (DISPOSABLE) ×2 IMPLANT
SPONGE SURGIFOAM ABS GEL 100 (HEMOSTASIS) IMPLANT
STRIP CLOSURE SKIN 1/2X4 (GAUZE/BANDAGES/DRESSINGS) ×2 IMPLANT
SURGIFLO TRUKIT (HEMOSTASIS) IMPLANT
SUT MNCRL AB 4-0 PS2 18 (SUTURE) IMPLANT
SUT SILK 4 0 (SUTURE)
SUT SILK 4-0 18XBRD TIE 12 (SUTURE) IMPLANT
SUT VIC AB 2-0 CT2 18 VCP726D (SUTURE) ×2 IMPLANT
SYR BULB IRRIGATION 50ML (SYRINGE) ×2 IMPLANT
SYR CONTROL 10ML LL (SYRINGE) ×2 IMPLANT
TAPE CLOTH 4X10 WHT NS (GAUZE/BANDAGES/DRESSINGS) ×2 IMPLANT
TAPE CLOTH SURG 4X10 WHT LF (GAUZE/BANDAGES/DRESSINGS) ×2 IMPLANT
TAPE UMBILICAL COTTON 1/8X30 (MISCELLANEOUS) ×4 IMPLANT
TOWEL OR 17X24 6PK STRL BLUE (TOWEL DISPOSABLE) ×2 IMPLANT
TOWEL OR 17X26 10 PK STRL BLUE (TOWEL DISPOSABLE) ×2 IMPLANT
TRAY FOLEY CATH 16FRSI W/METER (SET/KITS/TRAYS/PACK) ×2 IMPLANT
WATER STERILE IRR 1000ML POUR (IV SOLUTION) ×2 IMPLANT
YANKAUER SUCT BULB TIP NO VENT (SUCTIONS) ×2 IMPLANT

## 2013-07-09 NOTE — Transfer of Care (Signed)
Immediate Anesthesia Transfer of Care Note  Patient: Andrea [name]  Procedure(s) Performed: Procedure(s) with comments: ANTERIOR CERVICAL DECOMPRESSION/DISCECTOMY FUSION 3 LEVELS (N/A) - Anterior cervical decompression fusion, cervical 3-4, cervical 4-5-, cervical 5-6 with instrumentation, allograft.  Patient Location: PACU  Anesthesia Type:General  Level of Consciousness: awake, alert  and sedated  Airway & Oxygen Therapy: Patient connected to face mask oxygen  Post-op Assessment: Report given to PACU RN  Post vital signs: stable  Complications: No apparent anesthesia complications

## 2013-07-09 NOTE — Anesthesia Procedure Notes (Addendum)
Procedure Name: Intubation Date/Time: 07/09/2013 12:42 PM Performed by: Ellin Goodie Pre-anesthesia Checklist: Patient identified, Emergency Drugs available, Suction available, Patient being monitored and Timeout performed Patient Re-evaluated:Patient Re-evaluated prior to inductionOxygen Delivery Method: Circle system utilized Preoxygenation: Pre-oxygenation with 100% oxygen Intubation Type: IV induction Ventilation: Mask ventilation without difficulty and Oral airway inserted - appropriate to patient size Laryngoscope Size: Mac and 3 Grade View: Grade I Tube type: Oral Tube size: 7.5 mm Number of attempts: 1 Airway Equipment and Method: Stylet Placement Confirmation: ETT inserted through vocal cords under direct vision,  positive ETCO2 and breath sounds checked- equal and bilateral Secured at: 23 cm Tube secured with: Tape Dental Injury: Teeth and Oropharynx as per pre-operative assessment  Comments: Easy atraumatic induction and intubation with MAC 3 blade.  Dr. Sampson Goon verified placement of ETT.  Carlynn Herald,  CRNA

## 2013-07-09 NOTE — Anesthesia Postprocedure Evaluation (Signed)
  Anesthesia Post-op Note  Patient: Jolanta Cabeza  Procedure(s) Performed: Procedure(s) with comments: ANTERIOR CERVICAL DECOMPRESSION/DISCECTOMY FUSION 3 LEVELS (N/A) - Anterior cervical decompression fusion, cervical 3-4, cervical 4-5-, cervical 5-6 with instrumentation, allograft.  Patient Location: PACU  Anesthesia Type:General  Level of Consciousness: awake  Airway and Oxygen Therapy: Patient Spontanous Breathing and Patient connected to nasal cannula oxygen  Post-op Pain: none  Post-op Assessment: Post-op Vital signs reviewed, Patient's Cardiovascular Status Stable, Respiratory Function Stable, Patent Airway and No signs of Nausea or vomiting  Post-op Vital Signs: Reviewed and stable  Complications: No apparent anesthesia complications

## 2013-07-09 NOTE — Plan of Care (Signed)
Problem: Consults Goal: Diagnosis - Spinal Surgery Cervical Spine Fusion: ACDF C3-C6

## 2013-07-09 NOTE — H&P (Signed)
PREOPERATIVE H&P  Chief Complaint: Balance deterioration  HPI: Andrea [name] is a 78 y.o. female who presents with ongoing deterioration in balance x 1 year. MRI = stenosis C3-6.  Past Medical History  Diagnosis Date  . Hypotension   . Depression with anxiety   . Cough     Wert-onset 08/2009, as of 2014- resolved   . Deep venous thrombosis     post op, rec'd/needed  blood thinner   . Cervicalgia   . Hypothyroidism   . Blood transfusion   . Constipation   . Shortness of breath   . Anxiety   . Dysrhythmia   . Anemia   . Depression   . Lumbar radiculopathy 09/26/2011  . Headache(784.0) 09/27/2011  . Pneumonia     never been in hosp. for pneumonia   . Sleep apnea     doesn't use CPAP any longer, since weight loss  . Arthritis     hands, spine   . History of stress test     15-20 yrs.ago   . Falls frequently     pt. reports that she was falling 3 times a day, has had PT at a facility for a while & now is getting home PT 2-3 times/ week    Past Surgical History  Procedure Laterality Date  . Replacement total knee bilateral    . Appendectomy    . Breast excisional biopsy      breast bxs on left x 2, breast bx of right x 1-all benign  . Laparoscopic ovarian cystectomy    . Joint replacement  [id number]    bilateral  . Tonsillectomy    . Eye surgery      cataracts bilateral /w IOL  . Hernia repair      umbilical hernia- 2004  . Back surgery      2000   History   Social History  . Marital Status: Widowed    Spouse Name: N/A    Number of Children: N/A  . Years of Education: N/A   Occupational History  . Retired Charity fundraiser    Social History Main Topics  . Smoking status: Former Smoker -- 0.50 packs/day    Types: Cigarettes    Quit date: 11/20/1968  . Smokeless tobacco: Never Used     Comment: smoked for approx. 10 years  . Alcohol Use: 0.6 oz/week    1 Glasses of wine per week     Comment: rare  . Drug Use: No  . Sexual Activity: No   Other Topics Concern  . Not  on file   Social History Narrative  . No narrative on file   Family History  Problem Relation Age of Onset  . Heart disease Father   . Malignant hyperthermia Father   . Deep vein thrombosis Son    Allergies  Allergen Reactions  . Clindamycin Hcl Other (See Comments)    REACTION: swelling, pt. Reports that she had a 16 lb. weightgain in one day   Prior to Admission medications   Medication Sig Start Date End Date Taking? Authorizing Provider  aspirin EC 81 MG tablet Take 81 mg by mouth daily.   Yes Historical Provider, MD  buPROPion (WELLBUTRIN XL) 300 MG 24 hr tablet Take 300 mg by mouth every morning.    Yes Historical Provider, MD  Cholecalciferol (VITAMIN D3) 5000 UNITS CAPS Take 1 capsule by mouth daily.   Yes Historical Provider, MD  clonazePAM (KLONOPIN) 0.5 MG tablet Take 0.5 mg by mouth 3 (  three) times daily as needed for anxiety.   Yes Historical Provider, MD  DULoxetine (CYMBALTA) 60 MG capsule Take 120 mg by mouth daily before breakfast.    Yes Historical Provider, MD  levothyroxine (SYNTHROID, LEVOTHROID) 50 MCG tablet Take 50 mcg by mouth every morning.    Yes Historical Provider, MD  oxyCODONE-acetaminophen (PERCOCET) 5-325 MG per tablet Take 1 tablet by mouth every 4 (four) hours as needed. pain   Yes Historical Provider, MD  temazepam (RESTORIL) 30 MG capsule Take 30 mg by mouth at bedtime.   Yes Historical Provider, MD  calcium carbonate (TUMS EX) 750 MG chewable tablet Chew 1 tablet by mouth daily.    Historical Provider, MD  diazepam (VALIUM) 10 MG tablet Take 10 mg by mouth every 6 (six) hours as needed for anxiety.    Historical Provider, MD  naproxen sodium (ANAPROX) 220 MG tablet Take 440 mg by mouth 2 (two) times daily with a meal.    Historical Provider, MD     All other systems have been reviewed and were otherwise negative with the exception of those mentioned in the HPI and as above.  Physical Exam: There were no vitals filed for this visit.  General:  Alert, no acute distress Cardiovascular: No pedal edema Respiratory: No cyanosis, no use of accessory musculature Skin: No lesions in the area of chief complaint Neurologic: Sensation intact distally Psychiatric: Patient is competent for consent with normal mood and affect Lymphatic: No axillary or cervical lymphadenopathy  MUSCULOSKELETAL: hyperreflexic UEs  Assessment/Plan: Myelopathy Plan for Procedure(s): ANTERIOR CERVICAL DECOMPRESSION/DISCECTOMY FUSION C3-6   Emilee Hero, MD 07/09/2013 8:12 AM

## 2013-07-09 NOTE — Anesthesia Preprocedure Evaluation (Addendum)
Anesthesia Evaluation  Patient identified by MRN, date of birth, ID band Patient awake    Reviewed: Allergy & Precautions, H&P , NPO status , Patient's Chart, lab work & pertinent test results, reviewed documented beta blocker date and time   Airway Mallampati: II TM Distance: >3 FB Neck ROM: Full    Dental no notable dental hx. (+) Edentulous Upper, Edentulous Lower and Dental Advisory Given   Pulmonary shortness of breath and with exertion, sleep apnea , pneumonia -, resolved,  breath sounds clear to auscultation  Pulmonary exam normal       Cardiovascular + dysrhythmias Rhythm:Regular Rate:Normal     Neuro/Psych  Headaches, PSYCHIATRIC DISORDERS Anxiety Depression  Neuromuscular disease    GI/Hepatic negative GI ROS, Neg liver ROS,   Endo/Other  Hypothyroidism Morbid obesity  Renal/GU negative Renal ROS  negative genitourinary   Musculoskeletal   Abdominal   Peds  Hematology negative hematology ROS (+)   Anesthesia Other Findings   Reproductive/Obstetrics negative OB ROS                       Anesthesia Physical Anesthesia Plan  ASA: III  Anesthesia Plan: General   Post-op Pain Management:    Induction: Intravenous  Airway Management Planned: Oral ETT  Additional Equipment:   Intra-op Plan:   Post-operative Plan: Extubation in OR  Informed Consent: I have reviewed the patients History and Physical, chart, labs and discussed the procedure including the risks, benefits and alternatives for the proposed anesthesia with the patient or authorized representative who has indicated his/her understanding and acceptance.   Dental advisory given  Plan Discussed with: CRNA  Anesthesia Plan Comments:         Anesthesia Quick Evaluation

## 2013-07-09 NOTE — Preoperative (Signed)
Beta Blockers   Reason not to administer Beta Blockers:Not Applicable 

## 2013-07-10 ENCOUNTER — Encounter (HOSPITAL_COMMUNITY): Payer: Self-pay | Admitting: Orthopedic Surgery

## 2013-07-10 MED ORDER — PNEUMOCOCCAL VAC POLYVALENT 25 MCG/0.5ML IJ INJ
0.5000 mL | INJECTION | INTRAMUSCULAR | Status: AC
  Administered 2013-07-11: 0.5 mL via INTRAMUSCULAR
  Filled 2013-07-10: qty 0.5

## 2013-07-10 MED ORDER — TRAMADOL HCL 50 MG PO TABS
50.0000 mg | ORAL_TABLET | Freq: Four times a day (QID) | ORAL | Status: DC | PRN
  Administered 2013-07-10 – 2013-07-12 (×4): 100 mg via ORAL
  Filled 2013-07-10 (×4): qty 2

## 2013-07-10 NOTE — Progress Notes (Signed)
Patient reports minimal neck pain. Wishes to go home, daughter wishes for patient to go to SNF for recovery.  BP 128/55  Pulse 69  Temp(Src) 98.3 F (36.8 C) (Axillary)  Resp 14  Ht 5\' 4"  (1.626 m)  Wt 107.049 kg (236 lb)  BMI 40.49 kg/m2  SpO2 97%  Patient appears comfortable, eating breakfast. Collar in place Neck supple Dressings in place  POD #1 after C3-6 ACDF, doing well  - PT/OT today - patient may need placement in SNF vs. Rehab, given her history of progressive falls, in addition to her recent right hand fracture. I did discuss case with patient PT for today - she will make appropriate recommendations. - hard collar at all times

## 2013-07-10 NOTE — Op Note (Signed)
NAMEEMMALEE, SOLIVAN NO.:  [id number]  MEDICAL RECORD NO.:  [id number]  LOCATION:  5N06C                        FACILITY:  [facility]  PHYSICIAN:  Estill Bamberg, MD      DATE OF BIRTH:  October 08, 1935  DATE OF PROCEDURE:  07/09/2013                              OPERATIVE REPORT   PREOPERATIVE DIAGNOSIS:  Cervical myelopathy.  POSTOPERATIVE DIAGNOSIS:  Cervical myelopathy.  PROCEDURE: 1. Anterior cervical decompression and fusion C3-4, C4-5, C5-6. 2. Placement of anterior instrumentation, C3-C6. 3. Insertion of interbody device x3 (Titan interbody spacers) 4. Use of morselized allograft (DBX mix). 5. Intraoperative use of fluoroscopy.  SURGEON:  Estill Bamberg, MD  ASSISTANT:  Jason Coop, PA-C  ANESTHESIA:  General endotracheal anesthesia.  COMPLICATIONS:  None.  DISPOSITION:  Stable.  ESTIMATED BLOOD LOSS:  Minimal.  INDICATIONS FOR PROCEDURE:  Briefly, Ms. Andrea [name] is a pleasant 78 year old female, who did initially present to me on June 02, 2013, with diffuse weakness and history of progressive falls and deterioration in her balance.  I did review an MRI of the patient's cervical spine which was clearly notable for prominent cervical degenerative disk disease.  She also had complaints related to her lumbar spine as well.  However, the cervical MRI was prominent and was notable for rather substantial central stenosis involving C3-4, C4-5, as well as C5-6.  There was also noted to be instability in the form of an anterolisthesis at the C4-5 level.  I did feel that the findings of the patient's radiographs and MRI did correlate to her symptomatology.  We therefore did have a discussion regarding going forward with an anterior cervical decompression and fusion from C3-C6.  The patient was aware of all risks and limitations of the procedure.  Of particular note, the patient did understand the goal of surgery was to prevent additional deterioration of her  current symptoms associated with myelopathy, not necessarily to reverse her present symptoms.  OPERATIVE DETAILS:  On July 09, 2013, the patient was brought to Surgery and general endotracheal anesthesia was administered.  The patient was placed supine on a hospital bed.  The neck was placed in a gentle degree of extension.  The shoulders were taped to the inferior aspect of the bed.  The neck was then prepped and draped in the usual fashion.  Antibiotics were given.  I then made an incision from the midline to the medial border of the sternocleidomastoid muscle.  The platysma was sharply incised.  I did identify the plane between the sternocleidomastoid muscle and the strap muscles.  The carotid artery was palpated and retracted laterally.  I was then able to identify the anterior cervical spine.  An intraoperative fluoroscopic view did confirm the appropriate operative levels.  I then subperiosteally exposed the vertebral bodies of C3, C4, C5, and C6.  I then placed a self-retaining retractor centered over the C5-6 interspace.  Caspar pins were placed into the vertebral bodies above and below and distraction was applied.  I then went forward with a complete diskectomy in the usual manner using a knife in addition to a series of curettes.  I did use a high-speed bur to remove osteophytes posteriorly as  well.  I was able to confirm a complete decompression of the spinal canal.  I then prepared the endplates and I did feel that a 5-mm interbody spacer would be the most appropriate fit.  This was packed with DBX mix and tamped into position in the usual fashion.  The Caspar pin was removed from the lower vertebral body.  The Caspar pin was then placed into the C4 vertebral body and distraction was again applied.  Diskectomy was performed in the manner as previously described.  Once again, I was able to confirm a complete decompression of the spinal canal.  I then again prepared the  endplates and at this level, I did place a 6 mm lordotic small interbody spacer packed with the DBX mix.  A similar diskectomy was performed at the C3-4 level in the manner previously described. Once again, the endplates were prepared, and once again, interbody spacer, 6 mm in height, lordotic, was packed with DBX mix and tamped into position in the usual fashion.  Once again, the Caspar pins were removed and bone wax was placed in their place.  I then chose an appropriately sized anterior cervical plate, which was placed over the anterior cervical spine.  Two 14 mm  vertebral body screws were placed in each vertebral body, from C3-C6, for a total of 8 vertebral body screws.  I was pleased with the purchase of each of the screws.  The CAM locking mechanism was then utilized to lock the screws to the plate.  I then copiously irrigated the wound.  I then explored the wound for any undue bleeding, and there was none encountered.  I then closed the platysma using 2-0 Vicryl.  The skin was then closed using 3-0 Monocryl. Benzoin and Steri-Strips were applied followed by sterile dressing. Hard collar was then placed and the patient was then awakened from general endotracheal anesthesia and transferred to recovery in stable condition.  Of note, all instrument counts were correct at the termination of the procedure.  Also, of note, Jason Coop was my assistant throughout the entirety of the procedure and did aide in essential retraction and suctioning needed throughout the procedure.     Estill Bamberg, MD     MD/MEDQ  D:  07/09/2013  T:  07/10/2013  Job:  [id number]  cc:   Jocelyn Lamer D. Pecola Leisure, M.D.

## 2013-07-10 NOTE — Progress Notes (Signed)
Upon making rounds, pt found lying supine in floor with aspen collar on. Pt states, "I was going to sit on my duffel bag and do my physical therapy exercises and my legs became weak and I sat down on floor". No change from AM assessment. Neuro check remains unchanged except pt is alert and oriented x 4 but makes inappropriate comments. Pt assisted up from floor without incident and placed back in bed. Bed alarm applied. Red socks and yellow armband applied. BP 152/62 P 88 91% RA resp 20. No visible s/sx of trauma. Pt denies pain. Pt states, "Im okay". Son, Onalee Hua, made aware of fall. Son states, "Mom falls all the time at home". Son states, "Mom takes a lot of narcotics at home and she falls a lot." Son states, "I just got off the phone with her not even 30 mins ago and she was ok". All rept to Dr. Dorise Bullion received to stop narcotics.

## 2013-07-10 NOTE — Evaluation (Signed)
Physical Therapy Evaluation Patient Details Name: Andrea [name] MRN: [id number] DOB: [birth year] Today's Date: 07/10/2013 Time: 4098-1191 PT Time Calculation (min): 39 min  PT Assessment / Plan / Recommendation History of Present Illness  Pt. presented with cervical myelopathy and underwent ADCF C3-4, C4-5, C5-6.  She fell 2 days ago by her report and sustained fx of R  hand (she locates fx to 3rd metacarpal bone).  She reports history of multiple falls.  Clinical Impression  Pt. Presents with history of multiple falls, most recent 2 days prior to admission, and now has undergone ACDF 3 level.  She had decreased effeciency in her mobility and needs increased time to complete tasks.  She is limited in what she can do with R hand now due to fx and splint.  This patient will benefit from acute PT to address these and below mobility issues.  Recommend ST SNF for rehab following acute stay due to her safety and mobility issues and decreased caregiver support in the home setting and with her self reported history of falls.    PT Assessment  Patient needs continued PT services    Follow Up Recommendations  SNF;Supervision/Assistance - 24 hour;Supervision for mobility/OOB    Does the patient have the potential to tolerate intense rehabilitation      Barriers to Discharge Decreased caregiver support      Equipment Recommendations  None recommended by PT    Recommendations for Other Services     Frequency Min 6X/week    Precautions / Restrictions Precautions Precautions: Cervical Precaution Comments: provided pt. with cervical precaution and information handout and reviewed info with her.  she was able to state that she is not to turn head even before we reviewed info. Required Braces or Orthoses: Cervical Brace Cervical Brace: Hard collar Restrictions Weight Bearing Restrictions: Yes RUE Weight Bearing: Non weight bearing Other Position/Activity Restrictions: presumed NWB through right  hand due to fx that occurred 2 days prior to cervical surgery.  Pt. is in splint with finger flexion component   Pertinent Vitals/Pain No pain reported; dyspnea 2/4 with walking      Mobility  Bed Mobility Bed Mobility: Rolling Left;Left Sidelying to Sit Rolling Left: 5: Supervision;With rail Left Sidelying to Sit: 5: Supervision;HOB elevated;With rails Details for Bed Mobility Assistance: Pt. with heavy dependence on use of bedrail to roll and to achieve trnasition to sitting upright.  It took her nearly 5 minutes to complete rolling then sitting up on side of bed. Transfers Transfers: Sit to Stand;Stand to Sit Sit to Stand: 4: Min assist;With upper extremity assist;From bed (L UE only) Stand to Sit: 4: Min assist;With upper extremity assist;To chair/3-in-1 (L UE only) Details for Transfer Assistance: pt. needed increased time to transition to standing and is not time effecient for functional mobility in case of needing to get to bathroom or exit home quickly Ambulation/Gait Ambulation/Gait Assistance: 4: Min assist Ambulation Distance (Feet): 30 Feet Assistive device: Rolling walker Ambulation/Gait Assistance Details: Pt. used thumb and forefinger to support herself on RW on R side due to presumed NWB status due to hand fx.  She moves slowly and needs increased time to accomplish gait.  She needed min assist for safety and stability.  She had mild SOB with ambulation Gait Pattern: Step-through pattern;Decreased step length - right;Decreased step length - left Gait velocity: decreased Stairs: No    Exercises     PT Diagnosis: Difficulty walking;Abnormality of gait  PT Problem List: Decreased activity tolerance;Decreased balance;Decreased mobility;Decreased knowledge of  use of DME;Decreased knowledge of precautions;Cardiopulmonary status limiting activity PT Treatment Interventions: DME instruction;Gait training;Functional mobility training;Therapeutic activities;Balance  training;Patient/family education     PT Goals(Current goals can be found in the care plan section) Acute Rehab PT Goals Patient Stated Goal: wants to get back home following her rehab process and be able to have reduced falls and more independence PT Goal Formulation: With patient Time For Goal Achievement: 07/17/13 Potential to Achieve Goals: Good  Visit Information  Last PT Received On: 07/10/13 Assistance Needed: +1 History of Present Illness: Pt. presented with cervical myelopathy and underwent ADCF C3-4, C4-5, C5-6.  She fell 2 days ago by her report and sustained fx of R  hand (she locates fx to 3rd metacarpal bone)       Prior Functioning  Home Living Family/patient expects to be discharged to:: Private residence Living Arrangements: Children Available Help at Discharge: Family;Available PRN/intermittently (lives with son who is gone all day and most of evening) Type of Home: House Home Access: Level entry Home Layout: One level Home Equipment: Walker - 4 wheels;Bedside commode;Shower seat;Hand held Careers information officer - 2 wheels Prior Function Level of Independence: Independent with assistive device(s) Comments: pt. reports she didn't use RW as much as the kids wanted her to.  Pt. reports she falls at least 2 times each week for the past couple of years.  She also reports this has decreased some in the last 1-2 months.l Communication Communication: No difficulties Dominant Hand: Right    Cognition  Cognition Arousal/Alertness: Awake/alert Behavior During Therapy: WFL for tasks assessed/performed Overall Cognitive Status: Within Functional Limits for tasks assessed    Extremity/Trunk Assessment Upper Extremity Assessment Upper Extremity Assessment: Defer to OT evaluation Lower Extremity Assessment Lower Extremity Assessment: Overall WFL for tasks assessed Cervical / Trunk Assessment Cervical / Trunk Assessment: Other exceptions (in Aspen brace)   Balance  Balance Balance Assessed: Yes Dynamic Sitting Balance Dynamic Sitting - Balance Support: Left upper extremity supported;Feet supported Dynamic Sitting - Level of Assistance: 5: Stand by assistance Dynamic Standing Balance Dynamic Standing - Balance Support: Left upper extremity supported;During functional activity Dynamic Standing - Level of Assistance: 4: Min assist  End of Session PT - End of Session Equipment Utilized During Treatment: Gait belt Activity Tolerance: Patient limited by fatigue;Patient tolerated treatment well Patient left: in chair;with call bell/phone within reach;with nursing/sitter in room Nurse Communication: Mobility status;Precautions  GP     Ferman Hamming 07/10/2013, 9:04 AM Weldon Picking PT Acute Rehab Services [phone] Bee[place] [phone]

## 2013-07-10 NOTE — Evaluation (Signed)
Occupational Therapy Evaluation Patient Details Name: Andrea [name] MRN: [id number] DOB: [birth year] Today's Date: 07/10/2013 Time: 4098-1191 OT Time Calculation (min): 30 min  OT Assessment / Plan / Recommendation History of present illness Pt. presented with cervical myelopathy and underwent ADCF C3-4, C4-5, C5-6.  She fell 2 days ago by her report and sustained fx of R  hand (she locates fx to 3rd metacarpal bone)   Clinical Impression   Pt with a hx of multiple falls now with Aspen collar and R hand fx.  Pt has dependence in LB ADL and needs education in cervical precautions prior to return home.  Pt has only intermittent assist of her family.  Recommending SNF for ST rehab, pt in agreement.  Will defer OT to SNF.    OT Assessment  All further OT needs can be met in the next venue of care    Follow Up Recommendations  SNF    Barriers to Discharge      Equipment Recommendations  None recommended by OT    Recommendations for Other Services    Frequency       Precautions / Restrictions Precautions Precautions: Cervical;Fall Precaution Comments: educated pt in cervical precautions related to ADL. Required Braces or Orthoses: Cervical Brace Cervical Brace: Hard collar Restrictions Weight Bearing Restrictions: Yes RUE Weight Bearing: Non weight bearing Other Position/Activity Restrictions: presumed NWB through right hand due to fx that occurred 2 days prior to cervical surgery.  Pt. is in splint with finger flexion component   Pertinent Vitals/Pain VSS, no pain reported, premedicated    ADL  Eating/Feeding: Independent (with L hand) Where Assessed - Eating/Feeding: Chair Grooming: Wash/dry hands;Min guard Where Assessed - Grooming: Unsupported standing Upper Body Bathing: Minimal assistance Where Assessed - Upper Body Bathing: Unsupported sitting Lower Body Bathing: Maximal assistance Where Assessed - Lower Body Bathing: Unsupported sitting;Supported sit to stand Upper  Body Dressing: Minimal assistance Where Assessed - Upper Body Dressing: Unsupported sitting Lower Body Dressing: Maximal assistance Where Assessed - Lower Body Dressing: Unsupported sitting;Supported sit to stand Toilet Transfer: Minimal assistance Toilet Transfer Method: Sit to stand Toilet Transfer Equipment: Comfort height toilet Toileting - Clothing Manipulation and Hygiene: Supervision/safety Where Assessed - Glass blower/designer Manipulation and Hygiene: Sit on 3-in-1 or toilet Equipment Used: Gait belt;Rolling walker Transfers/Ambulation Related to ADLs: min assist with RW and lightly guiding with R hand ADL Comments: Pt not able to reach her feet PTA, has adaptive equipment, but did not use.  Hx of falls getting in tub.  Educated pt in availability of tub transfer bench.    OT Diagnosis: Generalized weakness;Acute pain  OT Problem List: Decreased strength;Decreased activity tolerance;Impaired balance (sitting and/or standing);Decreased knowledge of use of DME or AE;Decreased knowledge of precautions;Impaired UE functional use;Pain OT Treatment Interventions:     OT Goals(Current goals can be found in the care plan section) Acute Rehab OT Goals Patient Stated Goal: wants to get back home following her rehab process and be able to have reduced falls and more independence  Visit Information  Last OT Received On: 07/10/13 Assistance Needed: +1 History of Present Illness: Pt. presented with cervical myelopathy and underwent ADCF C3-4, C4-5, C5-6.  She fell 2 days ago by her report and sustained fx of R  hand (she locates fx to 3rd metacarpal bone)       Prior Functioning     Home Living Family/patient expects to be discharged to:: Skilled nursing facility Living Arrangements: Children Available Help at Discharge: Family;Available PRN/intermittently Type of Home:  House Home Access: Level entry Home Layout: One level Home Equipment: Walker - 4 wheels;Bedside commode;Shower  seat;Hand held Careers information officer - 2 wheels (reacher, sock aid, back brace) Prior Function Level of Independence: Independent with assistive device(s) (pt avoids socks and shoes with fasteners) Comments: pt. reports she didn't use RW as much as the kids wanted her to.  Pt. reports she falls at least 2 times each week for the past couple of years.  She also reports this has decreased some in the last 1-2 months.l Communication Communication: No difficulties Dominant Hand: Right         Vision/Perception Vision - History Baseline Vision: Wears glasses all the time Patient Visual Report: No change from baseline   Cognition  Cognition Arousal/Alertness: Awake/alert Behavior During Therapy: WFL for tasks assessed/performed Overall Cognitive Status: Within Functional Limits for tasks assessed    Extremity/Trunk Assessment Upper Extremity Assessment Upper Extremity Assessment: RUE deficits/detail RUE Deficits / Details: Pt with R wrist/hand splint.  Does not remove. Lower Extremity Assessment Lower Extremity Assessment: Defer to PT evaluation Cervical / Trunk Assessment Cervical / Trunk Assessment: Other exceptions (in Aspen brace)     Mobility Bed Mobility Bed Mobility: Not assessed Transfers Sit to Stand: 4: Min assist;With upper extremity assist;From bed (L UE only) Stand to Sit: 4: Min assist;With upper extremity assist;To chair/3-in-1 (L UE only) Details for Transfer Assistance: pt. needed increased time to transition to standing and is not time effecient for functional mobility in case of needing to get to bathroom or exit home quickly     Exercise     Balance Balance Balance Assessed: Yes Dynamic Sitting Balance Dynamic Sitting - Balance Support: Left upper extremity supported;Feet supported Dynamic Sitting - Level of Assistance: 5: Stand by assistance Dynamic Standing Balance Dynamic Standing - Balance Support: Left upper extremity supported;During functional  activity Dynamic Standing - Level of Assistance: 4: Min assist   End of Session OT - End of Session Activity Tolerance: Patient tolerated treatment well Patient left: in chair;with call bell/phone within reach Nurse Communication:  (d/c plan)  GO     Evern Bio 07/10/2013, 9:22 AM [phone]

## 2013-07-10 NOTE — Plan of Care (Signed)
Problem: Consults Goal: Diagnosis - Spinal Surgery Outcome: Completed/Met Date Met:  07/10/13 Cervical Spine Fusion

## 2013-07-10 NOTE — Progress Notes (Signed)
Orthopedic Tech Progress Note Patient Details:  Andrea [name] 09/29/1935 [id number] [place] collar ordered and delivered Ortho Devices Type of Ortho Device: [place] cervical collar Ortho Device/Splint Interventions: Ordered   [name] 07/10/2013, 9:01 AM

## 2013-07-10 NOTE — Progress Notes (Signed)
UR COMPLETED  

## 2013-07-11 NOTE — Progress Notes (Signed)
Physical Therapy Treatment Patient Details Name: Andrea [name] MRN: [id number] DOB: 10/15/1935 Today's Date: 07/11/2013 Time: 1016-1030 PT Time Calculation (min): 14 min  PT Assessment / Plan / Recommendation  History of Present Illness Pt. presented with cervical myelopathy and underwent ADCF C3-4, C4-5, C5-6.  She fell 2 days ago by her report and sustained fx of R  hand (she locates fx to 3rd metacarpal bone)   PT Comments   Patient able to increase ambulation this session however still unsteady with use of RW and cues needed for safety. Patient reeducated on importance of not getting up without assistance. Patient awaiting SNF at this time.   Follow Up Recommendations  SNF;Supervision/Assistance - 24 hour;Supervision for mobility/OOB     Does the patient have the potential to tolerate intense rehabilitation     Barriers to Discharge        Equipment Recommendations  None recommended by PT    Recommendations for Other Services    Frequency Min 6X/week   Progress towards PT Goals Progress towards PT goals: Progressing toward goals  Plan Current plan remains appropriate    Precautions / Restrictions Precautions Precautions: Cervical;Fall Required Braces or Orthoses: Cervical Brace Cervical Brace: Hard collar Restrictions RUE Weight Bearing: Non weight bearing Other Position/Activity Restrictions: presumed NWB through right hand due to fx that occurred 2 days prior to cervical surgery.  Pt. is in splint with finger flexion component   Pertinent Vitals/Pain No complaints of pain    Mobility  Bed Mobility Rolling Left: 5: Supervision;With rail Left Sidelying to Sit: 5: Supervision;HOB elevated;With rails Details for Bed Mobility Assistance: Pt. with heavy dependence on use of bedrail to roll and to achieve trnasition to sitting upright.  Patient reeducated on log roll technique.  Transfers Transfers: Sit to Stand;Stand to Sit Sit to Stand: 4: Min assist;With upper extremity  assist;From bed;From toilet Stand to Sit: 4: Min assist;With upper extremity assist;To chair/3-in-1;To toilet Details for Transfer Assistance: Cues for safe hand placement  Ambulation/Gait Ambulation/Gait Assistance: 4: Min assist Ambulation Distance (Feet): 80 Feet Assistive device: Rolling walker Ambulation/Gait Assistance Details: Continued to use thumb and forefinger to ensure NWB on R hand. Cues for upright posture. A and cueing for safe management of RW Gait Pattern: Step-through pattern;Decreased step length - right;Decreased step length - left Gait velocity: decreased    Exercises     PT Diagnosis:    PT Problem List:   PT Treatment Interventions:     PT Goals (current goals can now be found in the care plan section)    Visit Information  Last PT Received On: 07/11/13 Assistance Needed: +1 History of Present Illness: Pt. presented with cervical myelopathy and underwent ADCF C3-4, C4-5, C5-6.  She fell 2 days ago by her report and sustained fx of R  hand (she locates fx to 3rd metacarpal bone)    Subjective Data      Cognition  Cognition Arousal/Alertness: Awake/alert Behavior During Therapy: WFL for tasks assessed/performed Overall Cognitive Status: Within Functional Limits for tasks assessed    Balance     End of Session PT - End of Session Equipment Utilized During Treatment: Gait belt Activity Tolerance: Patient tolerated treatment well Patient left: in chair;with call bell/phone within reach;with nursing/sitter in room;with chair alarm set Nurse Communication: Mobility status;Precautions   GP     Fredrich Birks 07/11/2013, 2:01 PM 07/11/2013 Fredrich Birks PTA [phone] pager [phone] office

## 2013-07-11 NOTE — Plan of Care (Signed)
Problem: Consults Goal: Diagnosis- Total Joint Replacement Primary Total Hip Left     

## 2013-07-11 NOTE — Discharge Summary (Signed)
Patient ID: Andrea [name] MRN: [id number] DOB/AGE: Dec 28, 1934 78 y.o.  Admit date: 07/09/2013 Discharge date: 07/11/2013  Admission Diagnoses: Myeloradiculopathy Discharge Diagnoses:  Same/Improved PO  Past Medical History  Diagnosis Date  . Hypotension   . Depression with anxiety   . Cough     Andrea [name] 08/2009, as of 2014- resolved   . Deep venous thrombosis     post op, rec'd/needed  blood thinner   . Cervicalgia   . Hypothyroidism   . Blood transfusion   . Constipation   . Shortness of breath   . Anxiety   . Dysrhythmia   . Anemia   . Depression   . Lumbar radiculopathy 09/26/2011  . Headache(784.0) 09/27/2011  . Pneumonia     never been in hosp. for pneumonia   . Sleep apnea     doesn't use CPAP any longer, since weight loss  . Arthritis     hands, spine   . History of stress test     15-20 yrs.ago   . Falls frequently     pt. reports that she was falling 3 times a day, has had PT at a facility for a while & now is getting home PT 2-3 times/ week   . Right hand fracture     Monday    Surgeries: Procedure(s): ANTERIOR CERVICAL DECOMPRESSION/DISCECTOMY FUSION 3 LEVELS C3-6 on 07/09/2013   Discharged Condition: Improved  Hospital Course: Andrea [name] is an 78 y.o. female who was admitted 07/09/2013 for operative treatment of myeloradiculopathy Patient has severe unremitting pain that affects sleep, daily activities, and work/hobbies. After pre-op clearance the patient was taken to the operating room on 07/09/2013 and underwent  Procedure(s): ANTERIOR CERVICAL DECOMPRESSION/DISCECTOMY FUSION 3 LEVELSC 3-6.    Patient was given perioperative antibiotics: Anti-infectives   Start     Dose/Rate Route Frequency Ordered Stop   07/09/13 2100  ceFAZolin (ANCEF) IVPB 1 g/50 mL premix     1 g 100 mL/hr over 30 Minutes Intravenous Every 8 hours 07/09/13 1812 07/10/13 0520   07/09/13 0600  ceFAZolin (ANCEF) IVPB 2 g/50 mL premix     2 g 100 mL/hr over 30 Minutes Intravenous On  call to O.R. 07/08/13 1416 07/09/13 1255       Patient was given sequential compression devices, early ambulation to prevent DVT.  Patient benefited maximally from hospital stay and there were no complications.    Recent vital signs: Patient Vitals for the past 24 hrs:  BP Temp Pulse Resp SpO2  07/11/13 1406 127/78 mmHg 97.8 F (36.6 C) 91 16 100 %  07/11/13 0624 - 98.2 F (36.8 C) 82 18 96 %  07/10/13 2211 149/70 mmHg 98.6 F (37 C) 80 16 98 %     Discharge Medications:     Medication List    STOP taking these medications       aspirin EC 81 MG tablet     naproxen sodium 220 MG tablet  Commonly known as:  ANAPROX      TAKE these medications       buPROPion 300 MG 24 hr tablet  Commonly known as:  WELLBUTRIN XL  Take 300 mg by mouth every morning.     calcium carbonate 750 MG chewable tablet  Commonly known as:  TUMS EX  Chew 1 tablet by mouth daily.     clonazePAM 0.5 MG tablet  Commonly known as:  KLONOPIN  Take 0.5 mg by mouth 3 (three) times daily as needed for anxiety.  diazepam 10 MG tablet  Commonly known as:  VALIUM  Take 10 mg by mouth every 6 (six) hours as needed for anxiety.     DULoxetine 60 MG capsule  Commonly known as:  CYMBALTA  Take 120 mg by mouth daily before breakfast.     levothyroxine 50 MCG tablet  Commonly known as:  SYNTHROID, LEVOTHROID  Take 50 mcg by mouth every morning.     oxyCODONE-acetaminophen 5-325 MG per tablet  Commonly known as:  PERCOCET/ROXICET  Take 1 tablet by mouth every 4 (four) hours as needed. pain     temazepam 30 MG capsule  Commonly known as:  RESTORIL  Take 30 mg by mouth at bedtime.     Vitamin D3 5000 UNITS Caps  Take 1 capsule by mouth daily.        Diagnostic Studies: Dg Cervical Spine 1 View  07/09/2013   *RADIOLOGY REPORT*  Clinical Data: Neck pain  DG C-ARM 1-60 MIN,DG CERVICAL SPINE - 1 VIEW  Comparison: MRI 06/11/2013.  Findings: C-arm radiographs are mislabeled as Andrea [name].  Technologist reports  the patient is Andrea [name]  There has been C3-C6 ACDF with anterior plating.  This corresponds with the operative note in Epic.  Satisfactory position and alignment.  IMPRESSION: As above.   Original Report Authenticated By: Davonna Belling, M.D.   Dg C-arm 1-60 Min  07/09/2013   *RADIOLOGY REPORT*  Clinical Data: Neck pain  DG C-ARM 1-60 MIN,DG CERVICAL SPINE - 1 VIEW  Comparison: MRI 06/11/2013.  Findings: C-arm radiographs are mislabeled as Andrea [name]. Technologist reports  the patient is Andrea [name]  There has been C3-C6 ACDF with anterior plating.  This corresponds with the operative note in Epic.  Satisfactory position and alignment.  IMPRESSION: As above.   Original Report Authenticated By: Davonna Belling, M.D.    Disposition: 01-Home or Self Care   POD #2 after C3-6 ACDF, doing well  - Cont PT/OT today - patient pending placement to SNF after PT recommendations  -Pt stated her [facility] nurse already made arrangements for a bed for her... Pending social work consult to discuss  - hard collar at all times  - Narcotics D/C'd secondary to sedation and falls. Pain well controlled on tramadol and valium  -D/C on Tramadol and Valium, written scripts in chart  -Pt ready for D/C from ortho standpoint to SNF when bed available  -F/U in office 2wks      Signed: Georga Bora

## 2013-07-11 NOTE — Progress Notes (Signed)
Patient reports minimal neck pain, expected post stiffness. Eager to go to SNF. Some swallowing difficulties not unexpected, improving. Denies breathing difficulty.   BP 149/70  Pulse 82  Temp(Src) 98.2 F (36.8 C) (Axillary)  Resp 18  Ht 5\' 4"  (1.626 m)  Wt 107.049 kg (236 lb)  BMI 40.49 kg/m2  SpO2 96%   Patient appears comfortable, eating breakfast. Hard collar in place. Neck supple. Dressing in place C/D/I  POD #2 after C3-6 ACDF, doing well   - Cont PT/OT today - patient pending placement to SNF after PT recommendations  -Pt stated her [facility] nurse already made arrangements for a bed for her... Pending social work consult to discuss - hard collar at all times - Narcotics D/C'd secondary to sedation and falls. Pain well controlled on tramadol and valium   -D/C on Tramadol and Valium, written scripts in chart  -Pt ready for D/C from ortho standpoint to SNF when bed available  -F/U in office 2wks

## 2013-07-11 NOTE — Clinical Social Work Psychosocial (Signed)
Clinical Social Work Department  BRIEF PSYCHOSOCIAL ASSESSMENT  Patient: Andrea [name] Account Number:  [id number] Admit date: 07/11/13 Clinical Social Worker Sabino Niemann, MSW Date/Time:  Referred by: Physician Date Referred: 07/10/13 Referred for   SNF Placement   Other Referral:  Interview type: Patient  Other interview type: PSYCHOSOCIAL DATA  Living Status: Alone Admitted from facility:  Level of care:  Primary support name: Andrea [name] Primary support relationship to patient: Daughter Degree of support available:  Strong and vested  CURRENT CONCERNS  Current Concerns   Post-Acute Placement   Other Concerns:  SOCIAL WORK ASSESSMENT / PLAN  CSW met with pt re: PT recommendation for SNF.   Pt lives with her son  CSW explained placement process and answered questions.   Pt reports [facility] as her preference    CSW completed FL2 and initiated SNF search.     Assessment/plan status: Information/Referral to [facility]  Other assessment/ plan:  Information/referral to community resources:  SNF     PATIENT'S/FAMILY'S RESPONSE TO PLAN OF CARE:  Pt  reports she is agreeable to ST SNF in order to increase strength and independence with mobility prior to returning home  Pt verbalized understanding of placement process and appreciation for CSW assist.   Sabino Niemann, MSW [phone]

## 2013-07-11 NOTE — Clinical Social Work Placement (Addendum)
Clinical Social Work Department  CLINICAL SOCIAL WORK PLACEMENT NOTE  07/11/2013  Patient: Lucienne Minks Account Number: [id number]  Admit date: 07/05/13  Clinical Social Worker: Sabino Niemann MSW Date/time: 07/11/2013 9:30 AM  Clinical Social Work is seeking post-discharge placement for this patient at the following level of care: SKILLED NURSING (*CSW will update this form in Epic as items are completed)  07/11/2013 Patient/family provided with [facility] Department of Clinical Social Work's list of facilities offering this level of care within the geographic area requested by the patient (or if unable, by the patient's family).  07/11/2013 Patient/family informed of their freedom to choose among providers that offer the needed level of care, that participate in Medicare, Medicaid or managed care program needed by the patient, have an available bed and are willing to accept the patient.  07/11/2013 Patient/family informed of [facility]' ownership interest in [facility], as well as of the fact that they are under no obligation to receive care at this facility.  PASARR submitted to EDS on 07/11/2013  PASARR number received from EDS on   FL2 transmitted to all facilities in geographic area requested by pt/family on 07/11/2013  FL2 transmitted to all facilities within larger geographic area on  Patient informed that his/her managed care company has contracts with or will negotiate with certain facilities, including the following:  Patient/family informed of bed offers received:  Patient chooses bed at [facility] Physician recommends and patient chooses bed at  Patient to be transferred to on 07/12/13 - Jetta Lout, CSW 8/23  Patient to be transferred to facility by Bunnie Domino [phone] in his personal car.Jetta Lout 8/23  The following physician request were entered in Epic:  Additional Comments:

## 2013-07-12 DIAGNOSIS — F329 Major depressive disorder, single episode, unspecified: Secondary | ICD-10-CM | POA: Diagnosis not present

## 2013-07-12 DIAGNOSIS — Z9181 History of falling: Secondary | ICD-10-CM | POA: Diagnosis not present

## 2013-07-12 DIAGNOSIS — E039 Hypothyroidism, unspecified: Secondary | ICD-10-CM | POA: Diagnosis not present

## 2013-07-12 DIAGNOSIS — M542 Cervicalgia: Secondary | ICD-10-CM | POA: Diagnosis not present

## 2013-07-12 DIAGNOSIS — M159 Polyosteoarthritis, unspecified: Secondary | ICD-10-CM | POA: Diagnosis not present

## 2013-07-12 DIAGNOSIS — F411 Generalized anxiety disorder: Secondary | ICD-10-CM | POA: Diagnosis not present

## 2013-07-12 DIAGNOSIS — M4802 Spinal stenosis, cervical region: Secondary | ICD-10-CM | POA: Diagnosis not present

## 2013-07-12 DIAGNOSIS — R0609 Other forms of dyspnea: Secondary | ICD-10-CM | POA: Diagnosis not present

## 2013-07-12 DIAGNOSIS — M6281 Muscle weakness (generalized): Secondary | ICD-10-CM | POA: Diagnosis not present

## 2013-07-12 DIAGNOSIS — R269 Unspecified abnormalities of gait and mobility: Secondary | ICD-10-CM | POA: Diagnosis not present

## 2013-07-12 DIAGNOSIS — Z4789 Encounter for other orthopedic aftercare: Secondary | ICD-10-CM | POA: Diagnosis not present

## 2013-07-12 DIAGNOSIS — M47812 Spondylosis without myelopathy or radiculopathy, cervical region: Secondary | ICD-10-CM | POA: Diagnosis not present

## 2013-07-12 NOTE — Progress Notes (Signed)
Subjective: 3 Days Post-Op Procedure(s) (LRB): ANTERIOR CERVICAL DECOMPRESSION/DISCECTOMY FUSION 3 LEVELS (N/A) Patient reports pain as 3 on 0-10 scale and mild.    Objective: Vital signs in last 24 hours: Temp:  [97.8 F (36.6 C)-98.3 F (36.8 C)] 98.1 F (36.7 C) (08/23 0535) Pulse Rate:  [74-91] 74 (08/23 0535) Resp:  [16-18] 18 (08/23 0535) BP: (127-156)/(67-78) 156/78 mmHg (08/23 0535) SpO2:  [93 %-100 %] 93 % (08/23 0535)  Intake/Output from previous day:   Intake/Output this shift:    No results found for this basename: HGB,  in the last 72 hours No results found for this basename: WBC, RBC, HCT, PLT,  in the last 72 hours No results found for this basename: NA, K, CL, CO2, BUN, CREATININE, GLUCOSE, CALCIUM,  in the last 72 hours No results found for this basename: LABPT, INR,  in the last 72 hours  Neurologically intact ABD soft Neurovascular intact Sensation intact distally Intact pulses distally  Assessment/Plan: 3 Days Post-Op Procedure(s) (LRB): ANTERIOR CERVICAL DECOMPRESSION/DISCECTOMY FUSION 3 LEVELS (N/A) Advance diet Discharge to SNF  Marlayna Bannister L 07/12/2013, 8:14 AM

## 2013-07-12 NOTE — Progress Notes (Signed)
Patient's DNR code status was DNR. Patient being transferred to [facility] and needed a DNR signed. Called on call MD for signature. DNR paper was signed by PA Gus Puma so that the patient can transfer to facility.

## 2013-07-12 NOTE — Progress Notes (Signed)
Patient is medically stable for D/C to [facility] today. Patient is being transported by her son Onalee Hua [phone] in his personal car. Clinical Child psychotherapist (CSW) explained to son that the D/C packet will need to be delivered to [facility] by him and patient. [facility] is aware of D/C today. Patient's most recent code status is DNR. Clinical Social Worker (CSW) confirmed with patient that she still wanted a DNR. DNR gold form was not on shadow chart RN Lupita Leash contacted PA to sign DNR. Nursing is aware of above. CSW singing off.   Jetta Lout, LCSWA Weekend CSW [phone]

## 2013-07-15 DIAGNOSIS — E039 Hypothyroidism, unspecified: Secondary | ICD-10-CM | POA: Diagnosis not present

## 2013-07-15 DIAGNOSIS — M4802 Spinal stenosis, cervical region: Secondary | ICD-10-CM | POA: Diagnosis not present

## 2013-07-15 DIAGNOSIS — R0609 Other forms of dyspnea: Secondary | ICD-10-CM | POA: Diagnosis not present

## 2013-07-16 DIAGNOSIS — E039 Hypothyroidism, unspecified: Secondary | ICD-10-CM | POA: Diagnosis not present

## 2013-07-16 DIAGNOSIS — F411 Generalized anxiety disorder: Secondary | ICD-10-CM | POA: Diagnosis not present

## 2013-07-16 DIAGNOSIS — F329 Major depressive disorder, single episode, unspecified: Secondary | ICD-10-CM | POA: Diagnosis not present

## 2013-07-16 DIAGNOSIS — M47812 Spondylosis without myelopathy or radiculopathy, cervical region: Secondary | ICD-10-CM | POA: Diagnosis not present

## 2013-08-06 DIAGNOSIS — Z4789 Encounter for other orthopedic aftercare: Secondary | ICD-10-CM | POA: Diagnosis not present

## 2013-08-06 DIAGNOSIS — I2510 Atherosclerotic heart disease of native coronary artery without angina pectoris: Secondary | ICD-10-CM | POA: Diagnosis not present

## 2013-08-06 DIAGNOSIS — Z5189 Encounter for other specified aftercare: Secondary | ICD-10-CM | POA: Diagnosis not present

## 2013-08-06 DIAGNOSIS — Z9181 History of falling: Secondary | ICD-10-CM | POA: Diagnosis not present

## 2013-08-06 DIAGNOSIS — I4891 Unspecified atrial fibrillation: Secondary | ICD-10-CM | POA: Diagnosis not present

## 2013-08-06 DIAGNOSIS — M2550 Pain in unspecified joint: Secondary | ICD-10-CM | POA: Diagnosis not present

## 2013-08-08 DIAGNOSIS — I4891 Unspecified atrial fibrillation: Secondary | ICD-10-CM | POA: Diagnosis not present

## 2013-08-08 DIAGNOSIS — I2510 Atherosclerotic heart disease of native coronary artery without angina pectoris: Secondary | ICD-10-CM | POA: Diagnosis not present

## 2013-08-08 DIAGNOSIS — M2550 Pain in unspecified joint: Secondary | ICD-10-CM | POA: Diagnosis not present

## 2013-08-08 DIAGNOSIS — Z9181 History of falling: Secondary | ICD-10-CM | POA: Diagnosis not present

## 2013-08-08 DIAGNOSIS — Z5189 Encounter for other specified aftercare: Secondary | ICD-10-CM | POA: Diagnosis not present

## 2013-08-08 DIAGNOSIS — Z4789 Encounter for other orthopedic aftercare: Secondary | ICD-10-CM | POA: Diagnosis not present

## 2013-08-12 DIAGNOSIS — M2550 Pain in unspecified joint: Secondary | ICD-10-CM | POA: Diagnosis not present

## 2013-08-12 DIAGNOSIS — Z4789 Encounter for other orthopedic aftercare: Secondary | ICD-10-CM | POA: Diagnosis not present

## 2013-08-12 DIAGNOSIS — Z9181 History of falling: Secondary | ICD-10-CM | POA: Diagnosis not present

## 2013-08-12 DIAGNOSIS — Z5189 Encounter for other specified aftercare: Secondary | ICD-10-CM | POA: Diagnosis not present

## 2013-08-12 DIAGNOSIS — I2510 Atherosclerotic heart disease of native coronary artery without angina pectoris: Secondary | ICD-10-CM | POA: Diagnosis not present

## 2013-08-12 DIAGNOSIS — I4891 Unspecified atrial fibrillation: Secondary | ICD-10-CM | POA: Diagnosis not present

## 2013-08-14 DIAGNOSIS — Z9181 History of falling: Secondary | ICD-10-CM | POA: Diagnosis not present

## 2013-08-14 DIAGNOSIS — I2510 Atherosclerotic heart disease of native coronary artery without angina pectoris: Secondary | ICD-10-CM | POA: Diagnosis not present

## 2013-08-14 DIAGNOSIS — I4891 Unspecified atrial fibrillation: Secondary | ICD-10-CM | POA: Diagnosis not present

## 2013-08-14 DIAGNOSIS — Z5189 Encounter for other specified aftercare: Secondary | ICD-10-CM | POA: Diagnosis not present

## 2013-08-14 DIAGNOSIS — M2550 Pain in unspecified joint: Secondary | ICD-10-CM | POA: Diagnosis not present

## 2013-08-14 DIAGNOSIS — Z4789 Encounter for other orthopedic aftercare: Secondary | ICD-10-CM | POA: Diagnosis not present

## 2013-08-15 DIAGNOSIS — M542 Cervicalgia: Secondary | ICD-10-CM | POA: Diagnosis not present

## 2013-08-15 DIAGNOSIS — S62329A Displaced fracture of shaft of unspecified metacarpal bone, initial encounter for closed fracture: Secondary | ICD-10-CM | POA: Diagnosis not present

## 2013-08-18 DIAGNOSIS — Z9181 History of falling: Secondary | ICD-10-CM | POA: Diagnosis not present

## 2013-08-18 DIAGNOSIS — Z4789 Encounter for other orthopedic aftercare: Secondary | ICD-10-CM | POA: Diagnosis not present

## 2013-08-18 DIAGNOSIS — I2510 Atherosclerotic heart disease of native coronary artery without angina pectoris: Secondary | ICD-10-CM | POA: Diagnosis not present

## 2013-08-18 DIAGNOSIS — I4891 Unspecified atrial fibrillation: Secondary | ICD-10-CM | POA: Diagnosis not present

## 2013-08-18 DIAGNOSIS — Z5189 Encounter for other specified aftercare: Secondary | ICD-10-CM | POA: Diagnosis not present

## 2013-08-18 DIAGNOSIS — M2550 Pain in unspecified joint: Secondary | ICD-10-CM | POA: Diagnosis not present

## 2013-08-19 DIAGNOSIS — I4891 Unspecified atrial fibrillation: Secondary | ICD-10-CM | POA: Diagnosis not present

## 2013-08-19 DIAGNOSIS — I2510 Atherosclerotic heart disease of native coronary artery without angina pectoris: Secondary | ICD-10-CM | POA: Diagnosis not present

## 2013-08-19 DIAGNOSIS — Z5189 Encounter for other specified aftercare: Secondary | ICD-10-CM | POA: Diagnosis not present

## 2013-08-19 DIAGNOSIS — Z4789 Encounter for other orthopedic aftercare: Secondary | ICD-10-CM | POA: Diagnosis not present

## 2013-08-19 DIAGNOSIS — Z9181 History of falling: Secondary | ICD-10-CM | POA: Diagnosis not present

## 2013-08-19 DIAGNOSIS — M2550 Pain in unspecified joint: Secondary | ICD-10-CM | POA: Diagnosis not present

## 2013-08-21 DIAGNOSIS — I4891 Unspecified atrial fibrillation: Secondary | ICD-10-CM | POA: Diagnosis not present

## 2013-08-21 DIAGNOSIS — M2550 Pain in unspecified joint: Secondary | ICD-10-CM | POA: Diagnosis not present

## 2013-08-21 DIAGNOSIS — Z4789 Encounter for other orthopedic aftercare: Secondary | ICD-10-CM | POA: Diagnosis not present

## 2013-08-21 DIAGNOSIS — Z9181 History of falling: Secondary | ICD-10-CM | POA: Diagnosis not present

## 2013-08-21 DIAGNOSIS — Z5189 Encounter for other specified aftercare: Secondary | ICD-10-CM | POA: Diagnosis not present

## 2013-08-21 DIAGNOSIS — I2510 Atherosclerotic heart disease of native coronary artery without angina pectoris: Secondary | ICD-10-CM | POA: Diagnosis not present

## 2013-08-22 DIAGNOSIS — M2550 Pain in unspecified joint: Secondary | ICD-10-CM | POA: Diagnosis not present

## 2013-08-22 DIAGNOSIS — Z5189 Encounter for other specified aftercare: Secondary | ICD-10-CM | POA: Diagnosis not present

## 2013-08-22 DIAGNOSIS — I4891 Unspecified atrial fibrillation: Secondary | ICD-10-CM | POA: Diagnosis not present

## 2013-08-22 DIAGNOSIS — Z4789 Encounter for other orthopedic aftercare: Secondary | ICD-10-CM | POA: Diagnosis not present

## 2013-08-22 DIAGNOSIS — I2510 Atherosclerotic heart disease of native coronary artery without angina pectoris: Secondary | ICD-10-CM | POA: Diagnosis not present

## 2013-08-22 DIAGNOSIS — Z9181 History of falling: Secondary | ICD-10-CM | POA: Diagnosis not present

## 2013-08-26 DIAGNOSIS — Z4789 Encounter for other orthopedic aftercare: Secondary | ICD-10-CM | POA: Diagnosis not present

## 2013-08-26 DIAGNOSIS — Z9181 History of falling: Secondary | ICD-10-CM | POA: Diagnosis not present

## 2013-08-26 DIAGNOSIS — Z5189 Encounter for other specified aftercare: Secondary | ICD-10-CM | POA: Diagnosis not present

## 2013-08-26 DIAGNOSIS — M2550 Pain in unspecified joint: Secondary | ICD-10-CM | POA: Diagnosis not present

## 2013-08-26 DIAGNOSIS — I2510 Atherosclerotic heart disease of native coronary artery without angina pectoris: Secondary | ICD-10-CM | POA: Diagnosis not present

## 2013-08-26 DIAGNOSIS — I4891 Unspecified atrial fibrillation: Secondary | ICD-10-CM | POA: Diagnosis not present

## 2013-08-27 DIAGNOSIS — Z4789 Encounter for other orthopedic aftercare: Secondary | ICD-10-CM | POA: Diagnosis not present

## 2013-08-27 DIAGNOSIS — Z5189 Encounter for other specified aftercare: Secondary | ICD-10-CM | POA: Diagnosis not present

## 2013-08-27 DIAGNOSIS — M2550 Pain in unspecified joint: Secondary | ICD-10-CM | POA: Diagnosis not present

## 2013-08-27 DIAGNOSIS — I2510 Atherosclerotic heart disease of native coronary artery without angina pectoris: Secondary | ICD-10-CM | POA: Diagnosis not present

## 2013-08-27 DIAGNOSIS — I4891 Unspecified atrial fibrillation: Secondary | ICD-10-CM | POA: Diagnosis not present

## 2013-08-27 DIAGNOSIS — Z9181 History of falling: Secondary | ICD-10-CM | POA: Diagnosis not present

## 2013-08-28 DIAGNOSIS — Z23 Encounter for immunization: Secondary | ICD-10-CM | POA: Diagnosis not present

## 2013-08-28 DIAGNOSIS — F411 Generalized anxiety disorder: Secondary | ICD-10-CM | POA: Diagnosis not present

## 2013-08-28 DIAGNOSIS — M542 Cervicalgia: Secondary | ICD-10-CM | POA: Diagnosis not present

## 2013-08-28 DIAGNOSIS — I10 Essential (primary) hypertension: Secondary | ICD-10-CM | POA: Diagnosis not present

## 2013-08-29 DIAGNOSIS — Z9181 History of falling: Secondary | ICD-10-CM | POA: Diagnosis not present

## 2013-08-29 DIAGNOSIS — Z5189 Encounter for other specified aftercare: Secondary | ICD-10-CM | POA: Diagnosis not present

## 2013-08-29 DIAGNOSIS — M2550 Pain in unspecified joint: Secondary | ICD-10-CM | POA: Diagnosis not present

## 2013-08-29 DIAGNOSIS — I2510 Atherosclerotic heart disease of native coronary artery without angina pectoris: Secondary | ICD-10-CM | POA: Diagnosis not present

## 2013-08-29 DIAGNOSIS — I4891 Unspecified atrial fibrillation: Secondary | ICD-10-CM | POA: Diagnosis not present

## 2013-08-29 DIAGNOSIS — Z4789 Encounter for other orthopedic aftercare: Secondary | ICD-10-CM | POA: Diagnosis not present

## 2013-09-01 DIAGNOSIS — I2510 Atherosclerotic heart disease of native coronary artery without angina pectoris: Secondary | ICD-10-CM | POA: Diagnosis not present

## 2013-09-01 DIAGNOSIS — M2550 Pain in unspecified joint: Secondary | ICD-10-CM | POA: Diagnosis not present

## 2013-09-01 DIAGNOSIS — Z4789 Encounter for other orthopedic aftercare: Secondary | ICD-10-CM | POA: Diagnosis not present

## 2013-09-01 DIAGNOSIS — Z9181 History of falling: Secondary | ICD-10-CM | POA: Diagnosis not present

## 2013-09-01 DIAGNOSIS — I4891 Unspecified atrial fibrillation: Secondary | ICD-10-CM | POA: Diagnosis not present

## 2013-09-01 DIAGNOSIS — Z5189 Encounter for other specified aftercare: Secondary | ICD-10-CM | POA: Diagnosis not present

## 2013-09-02 DIAGNOSIS — Z9181 History of falling: Secondary | ICD-10-CM | POA: Diagnosis not present

## 2013-09-02 DIAGNOSIS — M2550 Pain in unspecified joint: Secondary | ICD-10-CM | POA: Diagnosis not present

## 2013-09-02 DIAGNOSIS — I4891 Unspecified atrial fibrillation: Secondary | ICD-10-CM | POA: Diagnosis not present

## 2013-09-02 DIAGNOSIS — I2510 Atherosclerotic heart disease of native coronary artery without angina pectoris: Secondary | ICD-10-CM | POA: Diagnosis not present

## 2013-09-02 DIAGNOSIS — Z5189 Encounter for other specified aftercare: Secondary | ICD-10-CM | POA: Diagnosis not present

## 2013-09-02 DIAGNOSIS — Z4789 Encounter for other orthopedic aftercare: Secondary | ICD-10-CM | POA: Diagnosis not present

## 2013-09-03 DIAGNOSIS — I4891 Unspecified atrial fibrillation: Secondary | ICD-10-CM | POA: Diagnosis not present

## 2013-09-03 DIAGNOSIS — M2550 Pain in unspecified joint: Secondary | ICD-10-CM | POA: Diagnosis not present

## 2013-09-03 DIAGNOSIS — Z4789 Encounter for other orthopedic aftercare: Secondary | ICD-10-CM | POA: Diagnosis not present

## 2013-09-03 DIAGNOSIS — Z5189 Encounter for other specified aftercare: Secondary | ICD-10-CM | POA: Diagnosis not present

## 2013-09-03 DIAGNOSIS — Z9181 History of falling: Secondary | ICD-10-CM | POA: Diagnosis not present

## 2013-09-03 DIAGNOSIS — I2510 Atherosclerotic heart disease of native coronary artery without angina pectoris: Secondary | ICD-10-CM | POA: Diagnosis not present

## 2013-09-04 DIAGNOSIS — Z5189 Encounter for other specified aftercare: Secondary | ICD-10-CM | POA: Diagnosis not present

## 2013-09-04 DIAGNOSIS — M2550 Pain in unspecified joint: Secondary | ICD-10-CM | POA: Diagnosis not present

## 2013-09-04 DIAGNOSIS — Z4789 Encounter for other orthopedic aftercare: Secondary | ICD-10-CM | POA: Diagnosis not present

## 2013-09-04 DIAGNOSIS — Z9181 History of falling: Secondary | ICD-10-CM | POA: Diagnosis not present

## 2013-09-04 DIAGNOSIS — I2510 Atherosclerotic heart disease of native coronary artery without angina pectoris: Secondary | ICD-10-CM | POA: Diagnosis not present

## 2013-09-04 DIAGNOSIS — I4891 Unspecified atrial fibrillation: Secondary | ICD-10-CM | POA: Diagnosis not present

## 2013-09-09 DIAGNOSIS — M2550 Pain in unspecified joint: Secondary | ICD-10-CM | POA: Diagnosis not present

## 2013-09-09 DIAGNOSIS — N390 Urinary tract infection, site not specified: Secondary | ICD-10-CM | POA: Diagnosis not present

## 2013-09-09 DIAGNOSIS — Z9181 History of falling: Secondary | ICD-10-CM | POA: Diagnosis not present

## 2013-09-09 DIAGNOSIS — B351 Tinea unguium: Secondary | ICD-10-CM | POA: Diagnosis not present

## 2013-09-09 DIAGNOSIS — Z4789 Encounter for other orthopedic aftercare: Secondary | ICD-10-CM | POA: Diagnosis not present

## 2013-09-09 DIAGNOSIS — I4891 Unspecified atrial fibrillation: Secondary | ICD-10-CM | POA: Diagnosis not present

## 2013-09-09 DIAGNOSIS — R609 Edema, unspecified: Secondary | ICD-10-CM | POA: Diagnosis not present

## 2013-09-09 DIAGNOSIS — Z5189 Encounter for other specified aftercare: Secondary | ICD-10-CM | POA: Diagnosis not present

## 2013-09-09 DIAGNOSIS — I2510 Atherosclerotic heart disease of native coronary artery without angina pectoris: Secondary | ICD-10-CM | POA: Diagnosis not present

## 2013-09-10 DIAGNOSIS — Z9181 History of falling: Secondary | ICD-10-CM | POA: Diagnosis not present

## 2013-09-10 DIAGNOSIS — M2550 Pain in unspecified joint: Secondary | ICD-10-CM | POA: Diagnosis not present

## 2013-09-10 DIAGNOSIS — I2510 Atherosclerotic heart disease of native coronary artery without angina pectoris: Secondary | ICD-10-CM | POA: Diagnosis not present

## 2013-09-10 DIAGNOSIS — Z5189 Encounter for other specified aftercare: Secondary | ICD-10-CM | POA: Diagnosis not present

## 2013-09-10 DIAGNOSIS — Z4789 Encounter for other orthopedic aftercare: Secondary | ICD-10-CM | POA: Diagnosis not present

## 2013-09-10 DIAGNOSIS — I4891 Unspecified atrial fibrillation: Secondary | ICD-10-CM | POA: Diagnosis not present

## 2013-09-11 DIAGNOSIS — I4891 Unspecified atrial fibrillation: Secondary | ICD-10-CM | POA: Diagnosis not present

## 2013-09-11 DIAGNOSIS — I2510 Atherosclerotic heart disease of native coronary artery without angina pectoris: Secondary | ICD-10-CM | POA: Diagnosis not present

## 2013-09-11 DIAGNOSIS — Z4789 Encounter for other orthopedic aftercare: Secondary | ICD-10-CM | POA: Diagnosis not present

## 2013-09-11 DIAGNOSIS — M2550 Pain in unspecified joint: Secondary | ICD-10-CM | POA: Diagnosis not present

## 2013-09-11 DIAGNOSIS — Z9181 History of falling: Secondary | ICD-10-CM | POA: Diagnosis not present

## 2013-09-11 DIAGNOSIS — Z5189 Encounter for other specified aftercare: Secondary | ICD-10-CM | POA: Diagnosis not present

## 2013-09-12 DIAGNOSIS — M542 Cervicalgia: Secondary | ICD-10-CM | POA: Diagnosis not present

## 2013-09-15 DIAGNOSIS — Z4789 Encounter for other orthopedic aftercare: Secondary | ICD-10-CM | POA: Diagnosis not present

## 2013-09-15 DIAGNOSIS — Z9181 History of falling: Secondary | ICD-10-CM | POA: Diagnosis not present

## 2013-09-15 DIAGNOSIS — I2510 Atherosclerotic heart disease of native coronary artery without angina pectoris: Secondary | ICD-10-CM | POA: Diagnosis not present

## 2013-09-15 DIAGNOSIS — Z5189 Encounter for other specified aftercare: Secondary | ICD-10-CM | POA: Diagnosis not present

## 2013-09-15 DIAGNOSIS — I4891 Unspecified atrial fibrillation: Secondary | ICD-10-CM | POA: Diagnosis not present

## 2013-09-15 DIAGNOSIS — M2550 Pain in unspecified joint: Secondary | ICD-10-CM | POA: Diagnosis not present

## 2013-09-16 DIAGNOSIS — Z9181 History of falling: Secondary | ICD-10-CM | POA: Diagnosis not present

## 2013-09-16 DIAGNOSIS — Z5189 Encounter for other specified aftercare: Secondary | ICD-10-CM | POA: Diagnosis not present

## 2013-09-16 DIAGNOSIS — I2510 Atherosclerotic heart disease of native coronary artery without angina pectoris: Secondary | ICD-10-CM | POA: Diagnosis not present

## 2013-09-16 DIAGNOSIS — Z4789 Encounter for other orthopedic aftercare: Secondary | ICD-10-CM | POA: Diagnosis not present

## 2013-09-16 DIAGNOSIS — M2550 Pain in unspecified joint: Secondary | ICD-10-CM | POA: Diagnosis not present

## 2013-09-16 DIAGNOSIS — I4891 Unspecified atrial fibrillation: Secondary | ICD-10-CM | POA: Diagnosis not present

## 2013-09-23 DIAGNOSIS — M2550 Pain in unspecified joint: Secondary | ICD-10-CM | POA: Diagnosis not present

## 2013-09-23 DIAGNOSIS — I4891 Unspecified atrial fibrillation: Secondary | ICD-10-CM | POA: Diagnosis not present

## 2013-09-23 DIAGNOSIS — I2510 Atherosclerotic heart disease of native coronary artery without angina pectoris: Secondary | ICD-10-CM | POA: Diagnosis not present

## 2013-09-23 DIAGNOSIS — Z5189 Encounter for other specified aftercare: Secondary | ICD-10-CM | POA: Diagnosis not present

## 2013-09-23 DIAGNOSIS — Z9181 History of falling: Secondary | ICD-10-CM | POA: Diagnosis not present

## 2013-09-23 DIAGNOSIS — Z4789 Encounter for other orthopedic aftercare: Secondary | ICD-10-CM | POA: Diagnosis not present

## 2013-09-25 DIAGNOSIS — Z4789 Encounter for other orthopedic aftercare: Secondary | ICD-10-CM | POA: Diagnosis not present

## 2013-09-25 DIAGNOSIS — M2550 Pain in unspecified joint: Secondary | ICD-10-CM | POA: Diagnosis not present

## 2013-09-25 DIAGNOSIS — Z5189 Encounter for other specified aftercare: Secondary | ICD-10-CM | POA: Diagnosis not present

## 2013-09-25 DIAGNOSIS — I4891 Unspecified atrial fibrillation: Secondary | ICD-10-CM | POA: Diagnosis not present

## 2013-09-25 DIAGNOSIS — I2510 Atherosclerotic heart disease of native coronary artery without angina pectoris: Secondary | ICD-10-CM | POA: Diagnosis not present

## 2013-09-25 DIAGNOSIS — Z9181 History of falling: Secondary | ICD-10-CM | POA: Diagnosis not present

## 2013-09-26 ENCOUNTER — Emergency Department (INDEPENDENT_AMBULATORY_CARE_PROVIDER_SITE_OTHER): Payer: Medicare Other

## 2013-09-26 ENCOUNTER — Encounter (HOSPITAL_COMMUNITY): Payer: Self-pay | Admitting: Emergency Medicine

## 2013-09-26 ENCOUNTER — Emergency Department (INDEPENDENT_AMBULATORY_CARE_PROVIDER_SITE_OTHER)
Admission: EM | Admit: 2013-09-26 | Discharge: 2013-09-26 | Disposition: A | Discharge status: Home / Self Care | Payer: Medicare Other | Source: Home / Self Care | Attending: Family Medicine | Admitting: Family Medicine

## 2013-09-26 DIAGNOSIS — J40 Bronchitis, not specified as acute or chronic: Secondary | ICD-10-CM

## 2013-09-26 DIAGNOSIS — R509 Fever, unspecified: Secondary | ICD-10-CM | POA: Diagnosis not present

## 2013-09-26 DIAGNOSIS — R0602 Shortness of breath: Secondary | ICD-10-CM | POA: Diagnosis not present

## 2013-09-26 DIAGNOSIS — R05 Cough: Secondary | ICD-10-CM | POA: Diagnosis not present

## 2013-09-26 MED ORDER — IPRATROPIUM BROMIDE 0.02 % IN SOLN
RESPIRATORY_TRACT | Status: AC
  Filled 2013-09-26: qty 2.5

## 2013-09-26 MED ORDER — PREDNISONE 20 MG PO TABS: 40.0000 mg | ORAL_TABLET | Freq: Every day | ORAL | Status: DC

## 2013-09-26 MED ORDER — ALBUTEROL SULFATE HFA 108 (90 BASE) MCG/ACT IN AERS: 2.0000 | INHALATION_SPRAY | Freq: Four times a day (QID) | RESPIRATORY_TRACT | Status: DC | PRN

## 2013-09-26 MED ORDER — GUAIFENESIN-CODEINE 100-10 MG/5ML PO SOLN: 5.0000 mL | Freq: Every evening | ORAL | Status: DC | PRN

## 2013-09-26 MED ORDER — ALBUTEROL SULFATE (5 MG/ML) 0.5% IN NEBU
INHALATION_SOLUTION | RESPIRATORY_TRACT | Status: AC
  Filled 2013-09-26: qty 0.5

## 2013-09-26 MED ORDER — IPRATROPIUM BROMIDE 0.02 % IN SOLN
0.5000 mg | Freq: Once | RESPIRATORY_TRACT | Status: AC
  Administered 2013-09-26: 0.5 mg via RESPIRATORY_TRACT

## 2013-09-26 MED ORDER — ALBUTEROL SULFATE (5 MG/ML) 0.5% IN NEBU
2.5000 mg | INHALATION_SOLUTION | Freq: Once | RESPIRATORY_TRACT | Status: AC
  Administered 2013-09-26: 2.5 mg via RESPIRATORY_TRACT

## 2013-09-26 MED ORDER — SODIUM CHLORIDE 0.9 % IN NEBU
INHALATION_SOLUTION | RESPIRATORY_TRACT | Status: AC
  Filled 2013-09-26: qty 3

## 2013-09-26 NOTE — ED Notes (Signed)
Pt c/o cold sxs onset Sunday PCP called her a z-pack on Monday that she finished  Sxs also include: productive cough, fevers, chest discomfort Denies: SOB, wheezing, v/n/d Alert w/no signs of acute distress.

## 2013-09-26 NOTE — ED Provider Notes (Signed)
Andrea [name] is a 78 y.o. female who presents to Urgent Care today for cough and shortness of breath present for the last 5 days. Patient notes the moderate nonproductive cough. She notes that this is also occurring with some minor shortness of breath. She notes that she has to sit up and oriented to alleviate coughing and shortness of breath. She additionally notes some lower extremity swelling. She denies any chest pains or palpitations.   Her medical history significant for frequent falls while truly due to spinal stenosis. This was treated with laminectomy in August of 2014. She has a echocardiogram showing a normal ejection fraction in 2011. She has a remote history of a postoperative DVT.  She notes that her left leg is always slightly more swollen than the right leg following her DVT. She denies any recent change in the asymmetrical nature of her legs swelling.   Past Medical History  Diagnosis Date  . Hypotension   . Depression with anxiety   . Cough     Wert-onset 08/2009, as of 2014- resolved   . Deep venous thrombosis     post op, rec'd/needed  blood thinner   . Cervicalgia   . Hypothyroidism   . Blood transfusion   . Constipation   . Shortness of breath   . Anxiety   . Dysrhythmia   . Anemia   . Depression   . Lumbar radiculopathy 09/26/2011  . Headache(784.0) 09/27/2011  . Pneumonia     never been in hosp. for pneumonia   . Sleep apnea     doesn't use CPAP any longer, since weight loss  . Arthritis     hands, spine   . History of stress test     15-20 yrs.ago   . Falls frequently     pt. reports that she was falling 3 times a day, has had PT at a facility for a while & now is getting home PT 2-3 times/ week   . Right hand fracture     Monday   History  Substance Use Topics  . Smoking status: Former Smoker -- 0.50 packs/day    Types: Cigarettes    Quit date: 11/20/1968  . Smokeless tobacco: Never Used     Comment: smoked for approx. 10 years  . Alcohol Use: 0.6  oz/week    1 Glasses of wine per week     Comment: rare   ROS as above Medications reviewed. No current facility-administered medications for this encounter.   Current Outpatient Prescriptions  Medication Sig Dispense Refill  . albuterol (PROVENTIL HFA;VENTOLIN HFA) 108 (90 BASE) MCG/ACT inhaler Inhale 2 puffs into the lungs every 6 (six) hours as needed for wheezing or shortness of breath.  1 Inhaler  2  . buPROPion (WELLBUTRIN XL) 300 MG 24 hr tablet Take 300 mg by mouth every morning.       . calcium carbonate (TUMS EX) 750 MG chewable tablet Chew 1 tablet by mouth daily.      . Cholecalciferol (VITAMIN D3) 5000 UNITS CAPS Take 1 capsule by mouth daily.      . clonazePAM (KLONOPIN) 0.5 MG tablet Take 0.5 mg by mouth 3 (three) times daily as needed for anxiety.      . diazepam (VALIUM) 10 MG tablet Take 10 mg by mouth every 6 (six) hours as needed for anxiety.      . DULoxetine (CYMBALTA) 60 MG capsule Take 120 mg by mouth daily before breakfast.       .  guaiFENesin-codeine 100-10 MG/5ML syrup Take 5 mLs by mouth at bedtime as needed for cough.  120 mL  0  . levothyroxine (SYNTHROID, LEVOTHROID) 50 MCG tablet Take 50 mcg by mouth every morning.       [name] oxyCODONE-acetaminophen (PERCOCET) 5-325 MG per tablet Take 1 tablet by mouth every 4 (four) hours as needed. pain      . predniSONE (DELTASONE) 20 MG tablet Take 2 tablets (40 mg total) by mouth daily.  10 tablet  0  . temazepam (RESTORIL) 30 MG capsule Take 30 mg by mouth at bedtime.        Exam:  BP 127/48  Pulse 70  Temp(Src) 98 F (36.7 C) (Oral)  Resp 18  SpO2 98% Gen: Well NAD HEENT: EOMI,  MMM Lungs: Mild shortness of breath at rest. Normal work of breathing. Lungs with slightly prolonged expiratory phase Heart: RRR no MRG Abd: NABS, NT, ND Exts: Trace edema bilateral lower extremity.  warm and well perfused.  Right calf diameter 44 cm, left calf diameter 46 cm  Patient was given a DuoNeb nebulizer treatment and had  significant improvement in subjective symptoms and normalization of her lung findings.  No results found for this or any previous visit (from the past 24 hour(s)). Dg Chest 2 View  09/26/2013   CLINICAL DATA:  Cough and congestion. Fever and shortness of Breath.  EXAM: CHEST  2 VIEW  COMPARISON:  04/23/2013  FINDINGS: The heart size and mediastinal contours are within normal limits.  Lungs are clear. No pleural effusion or pneumothorax.  The bony thorax is demineralized with degenerative changes along the mid and lower thoracic spine.  IMPRESSION: No active cardiopulmonary disease.   Electronically Signed   By: Amie [place] M.D.   On: 09/26/2013 10:22    Assessment and Plan: 78 y.o. female with bronchitis. Plan to treat with prednisone, albuterol, and codeine containing cough medication. I do not believe the patient to be in heart failure or suffering from a DVT or PE. She does not have any oxygen saturation changes, tachycardia, severe lower extremity swelling, or chest pain.   Her leg swelling is unchanged. We discussed testing versus treatment. Patient had significant improvement with nebulizer treatment therefore I believe the correct approach would be to address bronchitis as a chief diagnosis.  If she does not improve I recommend followup with primary care provider for further testing. She may benefit from lower extremity ultrasound at that time if her swelling persists or worsens.  Discussed warning signs or symptoms. Please see discharge instructions. Patient expresses understanding.      Rodolph Bong, MD 09/26/13 [phone]

## 2013-09-30 DIAGNOSIS — Z4789 Encounter for other orthopedic aftercare: Secondary | ICD-10-CM | POA: Diagnosis not present

## 2013-09-30 DIAGNOSIS — Z9181 History of falling: Secondary | ICD-10-CM | POA: Diagnosis not present

## 2013-09-30 DIAGNOSIS — M2550 Pain in unspecified joint: Secondary | ICD-10-CM | POA: Diagnosis not present

## 2013-09-30 DIAGNOSIS — I2510 Atherosclerotic heart disease of native coronary artery without angina pectoris: Secondary | ICD-10-CM | POA: Diagnosis not present

## 2013-09-30 DIAGNOSIS — I4891 Unspecified atrial fibrillation: Secondary | ICD-10-CM | POA: Diagnosis not present

## 2013-09-30 DIAGNOSIS — Z5189 Encounter for other specified aftercare: Secondary | ICD-10-CM | POA: Diagnosis not present

## 2013-10-02 DIAGNOSIS — I4891 Unspecified atrial fibrillation: Secondary | ICD-10-CM | POA: Diagnosis not present

## 2013-10-02 DIAGNOSIS — I2510 Atherosclerotic heart disease of native coronary artery without angina pectoris: Secondary | ICD-10-CM | POA: Diagnosis not present

## 2013-10-02 DIAGNOSIS — M2550 Pain in unspecified joint: Secondary | ICD-10-CM | POA: Diagnosis not present

## 2013-10-02 DIAGNOSIS — Z4789 Encounter for other orthopedic aftercare: Secondary | ICD-10-CM | POA: Diagnosis not present

## 2013-10-02 DIAGNOSIS — Z9181 History of falling: Secondary | ICD-10-CM | POA: Diagnosis not present

## 2013-10-02 DIAGNOSIS — Z5189 Encounter for other specified aftercare: Secondary | ICD-10-CM | POA: Diagnosis not present

## 2013-10-03 DIAGNOSIS — I4891 Unspecified atrial fibrillation: Secondary | ICD-10-CM | POA: Diagnosis not present

## 2013-10-03 DIAGNOSIS — I2510 Atherosclerotic heart disease of native coronary artery without angina pectoris: Secondary | ICD-10-CM | POA: Diagnosis not present

## 2013-10-03 DIAGNOSIS — M2550 Pain in unspecified joint: Secondary | ICD-10-CM | POA: Diagnosis not present

## 2013-10-03 DIAGNOSIS — Z5189 Encounter for other specified aftercare: Secondary | ICD-10-CM | POA: Diagnosis not present

## 2013-10-03 DIAGNOSIS — Z9181 History of falling: Secondary | ICD-10-CM | POA: Diagnosis not present

## 2013-10-03 DIAGNOSIS — Z4789 Encounter for other orthopedic aftercare: Secondary | ICD-10-CM | POA: Diagnosis not present

## 2013-10-12 ENCOUNTER — Other Ambulatory Visit: Payer: Self-pay

## 2013-10-12 ENCOUNTER — Encounter (HOSPITAL_COMMUNITY): Payer: Self-pay | Admitting: Emergency Medicine

## 2013-10-12 ENCOUNTER — Emergency Department (INDEPENDENT_AMBULATORY_CARE_PROVIDER_SITE_OTHER)
Admission: EM | Admit: 2013-10-12 | Discharge: 2013-10-12 | Disposition: A | Discharge status: Home / Self Care | Payer: Medicare Other | Source: Home / Self Care | Attending: Emergency Medicine | Admitting: Emergency Medicine

## 2013-10-12 DIAGNOSIS — M79609 Pain in unspecified limb: Secondary | ICD-10-CM | POA: Diagnosis not present

## 2013-10-12 DIAGNOSIS — M7989 Other specified soft tissue disorders: Secondary | ICD-10-CM | POA: Diagnosis not present

## 2013-10-12 DIAGNOSIS — M79605 Pain in left leg: Secondary | ICD-10-CM

## 2013-10-12 MED ORDER — ENOXAPARIN SODIUM 120 MG/0.8ML ~~LOC~~ SOLN: 1.0000 mg/kg | Freq: Once | SUBCUTANEOUS | Status: DC

## 2013-10-12 MED ORDER — ENOXAPARIN SODIUM 100 MG/ML ~~LOC~~ SOLN: 1.0000 mg/kg | Freq: Once | SUBCUTANEOUS | Status: DC

## 2013-10-12 MED ORDER — ENOXAPARIN SODIUM 100 MG/ML ~~LOC~~ SOLN
1.0000 mg/kg | Freq: Once | SUBCUTANEOUS | Status: AC
  Administered 2013-10-12: 95 mg via SUBCUTANEOUS
  Filled 2013-10-12: qty 1

## 2013-10-12 NOTE — ED Provider Notes (Signed)
CSN: [id number]     Arrival date & time 10/12/13  1617 History   First MD Initiated Contact with Patient 10/12/13 1749     Chief Complaint  Patient presents with  . Leg Pain   (Consider location/radiation/quality/duration/timing/severity/associated sxs/prior Treatment) HPI Comments: 78 year old female presents complaining of left sided leg pain and swelling. She has a history of DVT 3 times so she wonders if that's what could be going on. This pain just began this afternoon. She occasionally has bilateral pitting edema for which she takes Lasix, but she is always told that if the pain or swelling is unilateral she needs to come be seen. That is the case today. She is not having any swelling in the right leg. She has a hard knot in the left leg that is tender to push on. Additionally, she admits to a history of mild shortness of breath for the past month. She was seen for this problem and was prescribed an albuterol inhaler which does help. She has no history of being short of breath. She has never had problems with her breathing and does not have COPD. She does have a history of smoking cigarettes but she quit 40 years ago. She has no recent travel. She has a recent surgery back in August-cervical laminectomy.  Patient is a 78 y.o. female presenting with leg pain.  Leg Pain Associated symptoms: no fever     Past Medical History  Diagnosis Date  . Hypotension   . Depression with anxiety   . Cough     Wert-onset 08/2009, as of 2014- resolved   . Deep venous thrombosis     post op, rec'd/needed  blood thinner   . Cervicalgia   . Hypothyroidism   . Blood transfusion   . Constipation   . Shortness of breath   . Anxiety   . Dysrhythmia   . Anemia   . Depression   . Lumbar radiculopathy 09/26/2011  . Headache(784.0) 09/27/2011  . Pneumonia     never been in hosp. for pneumonia   . Sleep apnea     doesn't use CPAP any longer, since weight loss  . Arthritis     hands, spine   . History  of stress test     15-20 yrs.ago   . Falls frequently     pt. reports that she was falling 3 times a day, has had PT at a facility for a while & now is getting home PT 2-3 times/ week   . Right hand fracture     Monday   Past Surgical History  Procedure Laterality Date  . Replacement total knee bilateral    . Appendectomy    . Breast excisional biopsy      breast bxs on left x 2, breast bx of right x 1-all benign  . Laparoscopic ovarian cystectomy    . Joint replacement  [id number]    bilateral  . Tonsillectomy    . Eye surgery      cataracts bilateral /w IOL  . Hernia repair      umbilical hernia- 2004  . Back surgery      2000  . Anterior cervical decomp/discectomy fusion N/A 07/09/2013    Procedure: ANTERIOR CERVICAL DECOMPRESSION/DISCECTOMY FUSION 3 LEVELS;  Surgeon: Emilee Hero, MD;  Location: [facility] OR;  Service: Orthopedics;  Laterality: N/A;  Anterior cervical decompression fusion, cervical 3-4, cervical 4-5-, cervical 5-6 with instrumentation, allograft.   Family History  Problem Relation Age of Onset  .  Heart disease Father   . Malignant hyperthermia Father   . Deep vein thrombosis Son    History  Substance Use Topics  . Smoking status: Former Smoker -- 0.50 packs/day    Types: Cigarettes    Quit date: 11/20/1968  . Smokeless tobacco: Never Used     Comment: smoked for approx. 10 years  . Alcohol Use: 0.6 oz/week    1 Glasses of wine per week     Comment: rare   OB History   Grav Para Term Preterm Abortions TAB SAB Ect Mult Living                 Review of Systems  Constitutional: Negative for fever and chills.  Eyes: Negative for visual disturbance.  Respiratory: Negative for cough and shortness of breath.   Cardiovascular: Positive for leg swelling (and pain). Negative for chest pain and palpitations.  Gastrointestinal: Negative for nausea, vomiting and abdominal pain.  Endocrine: Negative for polydipsia and polyuria.  Genitourinary: Negative for  dysuria, urgency and frequency.  Musculoskeletal: Negative for arthralgias and myalgias.  Skin: Negative for rash.  Neurological: Negative for dizziness, weakness and light-headedness.    Allergies  Clindamycin hcl  Home Medications   Current Outpatient Rx  Name  Route  Sig  Dispense  Refill  . albuterol (PROVENTIL HFA;VENTOLIN HFA) 108 (90 BASE) MCG/ACT inhaler   Inhalation   Inhale 2 puffs into the lungs every 6 (six) hours as needed for wheezing or shortness of breath.   1 Inhaler   2   . buPROPion (WELLBUTRIN XL) 300 MG 24 hr tablet   Oral   Take 300 mg by mouth every morning.          . calcium carbonate (TUMS EX) 750 MG chewable tablet   Oral   Chew 1 tablet by mouth daily.         . Cholecalciferol (VITAMIN D3) 5000 UNITS CAPS   Oral   Take 1 capsule by mouth daily.         . clonazePAM (KLONOPIN) 0.5 MG tablet   Oral   Take 0.5 mg by mouth 3 (three) times daily as needed for anxiety.         . DULoxetine (CYMBALTA) 60 MG capsule   Oral   Take 120 mg by mouth daily before breakfast.          . guaiFENesin-codeine 100-10 MG/5ML syrup   Oral   Take 5 mLs by mouth at bedtime as needed for cough.   120 mL   0   . levothyroxine (SYNTHROID, LEVOTHROID) 50 MCG tablet   Oral   Take 50 mcg by mouth every morning.          [name] oxyCODONE-acetaminophen (PERCOCET) 5-325 MG per tablet   Oral   Take 1 tablet by mouth every 4 (four) hours as needed. pain         . temazepam (RESTORIL) 30 MG capsule   Oral   Take 30 mg by mouth at bedtime.         . diazepam (VALIUM) 10 MG tablet   Oral   Take 10 mg by mouth every 6 (six) hours as needed for anxiety.         . predniSONE (DELTASONE) 20 MG tablet   Oral   Take 2 tablets (40 mg total) by mouth daily.   10 tablet   0    BP 151/71  Pulse 78  Temp(Src) 98.7 F (37.1 C) (Oral)  Resp 18  SpO2 100% Physical Exam  Nursing note and vitals reviewed. Constitutional: She is oriented to person,  place, and time. Vital signs are normal. She appears well-developed and well-nourished. No distress.  HENT:  Head: Normocephalic and atraumatic.  Cardiovascular: Normal rate, regular rhythm and intact distal pulses.  Exam reveals no gallop and no friction rub.   Murmur heard.  Decrescendo systolic murmur is present with a grade of 2/6  2+ pitting edema over the left ankle. Tender, palpable cord in the medial left leg and in the back of the left calf up to the knee. No tenderness above the level of the knee.  Pulmonary/Chest: Effort normal and breath sounds normal. No respiratory distress. She has no wheezes. She has no rales.  Neurological: She is alert and oriented to person, place, and time. She has normal strength. Coordination normal.  Skin: Skin is warm and dry. No rash noted. She is not diaphoretic.  Psychiatric: She has a normal mood and affect. Judgment normal.    ED Course  Procedures (including critical care time) Labs Review Labs Reviewed - No data to display Imaging Review No results found.  Given the recent mild shortness of breath and recurrent symptoms suggestive of DVT, getting EKG.  EKG is negative  MDM   1. Leg swelling   2. Leg pain, left    Given her history of multiple DVTs and her current symptoms, will treat as a DVT. She will receive an injection of Lovenox, 1 mg per kilogram here tonight and she will get a venous duplex study done in the morning. She was agreeable with this plan. Emergency department if any chest pain, shortness of breath, pleuritic pain.    Meds ordered this encounter  Medications  . enoxaparin (LOVENOX) injection 95 mg    Sig:        Graylon Good, PA-C 10/12/13 1950

## 2013-10-12 NOTE — ED Notes (Signed)
78 yr old is here with her sister with complaints of bilateral leg pain that started this afternoon. She states she has a HX of DVT in both legs. She states she had surgery about six weeks ago. PS: 5 - Left leg No other complaints.

## 2013-10-12 NOTE — ED Provider Notes (Signed)
Medical screening examination/treatment/procedure(s) were performed by non-physician practitioner and as supervising physician I was immediately available for consultation/collaboration.  Leslee Home, M.D.  Reuben Likes, MD 10/12/13 [phone]

## 2013-10-13 ENCOUNTER — Ambulatory Visit (HOSPITAL_COMMUNITY)
Admission: RE | Admit: 2013-10-13 | Discharge: 2013-10-13 | Disposition: A | Discharge status: Home / Self Care | Payer: Medicare Other | Source: Ambulatory Visit | Attending: Family Medicine | Admitting: Family Medicine

## 2013-10-13 ENCOUNTER — Encounter (HOSPITAL_COMMUNITY): Payer: Self-pay | Admitting: Emergency Medicine

## 2013-10-13 ENCOUNTER — Emergency Department (INDEPENDENT_AMBULATORY_CARE_PROVIDER_SITE_OTHER)
Admission: EM | Admit: 2013-10-13 | Discharge: 2013-10-13 | Disposition: A | Discharge status: Home / Self Care | Payer: Medicare Other | Source: Home / Self Care | Attending: Family Medicine | Admitting: Family Medicine

## 2013-10-13 DIAGNOSIS — I82402 Acute embolism and thrombosis of unspecified deep veins of left lower extremity: Secondary | ICD-10-CM

## 2013-10-13 DIAGNOSIS — M7989 Other specified soft tissue disorders: Secondary | ICD-10-CM

## 2013-10-13 DIAGNOSIS — I82409 Acute embolism and thrombosis of unspecified deep veins of unspecified lower extremity: Secondary | ICD-10-CM | POA: Diagnosis not present

## 2013-10-13 DIAGNOSIS — R609 Edema, unspecified: Secondary | ICD-10-CM | POA: Insufficient documentation

## 2013-10-13 MED ORDER — RIVAROXABAN 15 MG PO TABS: 15.0000 mg | ORAL_TABLET | Freq: Two times a day (BID) | ORAL | Status: DC

## 2013-10-13 MED ORDER — WARFARIN SODIUM 5 MG PO TABS: 5.0000 mg | ORAL_TABLET | Freq: Every day | ORAL | Status: DC

## 2013-10-13 NOTE — Progress Notes (Signed)
*  Preliminary Results* Left lower extremity venous duplex completed. Left lower extremity is positive for deep vein thrombosis involving a single left posterior tibial vein. There is also a thrombosed varicose vein of the distal medial left calf. There is no evidence of left Baker's cyst.  Preliminary results called in to [phone], and patient has been instructed to return to Urgent Care for treatment.  10/13/2013 8:42 AM  Gertie Fey, RVT, RDCS, RDMS

## 2013-10-13 NOTE — ED Notes (Signed)
Positive for DVT in left calf, w thrombosed varicose vein in left calf, per tech in vascular lab. Dr Lorenz Coaster asks that we have pt return to the [facility] for further directions and discussion

## 2013-10-13 NOTE — ED Notes (Signed)
Here for follow up visit ; positive report for DVT from vascular lab

## 2013-10-13 NOTE — ED Provider Notes (Signed)
Andrea [name] is a 78 y.o. female who presents to Urgent Care today for DVT. Patient was seen yesterday for leg swelling. She was treated with Lovenox and scheduled for a vascular ultrasound this morning. The ultrasound is positive and she is here today for followup. She notes improving left lower extremity pain and swelling. She denies any cough chest pain or shortness of breath.   Past Medical History  Diagnosis Date  . Hypotension   . Depression with anxiety   . Cough     Wert-onset 08/2009, as of 2014- resolved   . Deep venous thrombosis     post op, rec'd/needed  blood thinner   . Cervicalgia   . Hypothyroidism   . Blood transfusion   . Constipation   . Shortness of breath   . Anxiety   . Dysrhythmia   . Anemia   . Depression   . Lumbar radiculopathy 09/26/2011  . Headache(784.0) 09/27/2011  . Pneumonia     never been in hosp. for pneumonia   . Sleep apnea     doesn't use CPAP any longer, since weight loss  . Arthritis     hands, spine   . History of stress test     15-20 yrs.ago   . Falls frequently     pt. reports that she was falling 3 times a day, has had PT at a facility for a while & now is getting home PT 2-3 times/ week   . Right hand fracture     Monday   History  Substance Use Topics  . Smoking status: Former Smoker -- 0.50 packs/day    Types: Cigarettes    Quit date: 11/20/1968  . Smokeless tobacco: Never Used     Comment: smoked for approx. 10 years  . Alcohol Use: 0.6 oz/week    1 Glasses of wine per week     Comment: rare   ROS as above Medications reviewed. No current facility-administered medications for this encounter.   Current Outpatient Prescriptions  Medication Sig Dispense Refill  . albuterol (PROVENTIL HFA;VENTOLIN HFA) 108 (90 BASE) MCG/ACT inhaler Inhale 2 puffs into the lungs every 6 (six) hours as needed for wheezing or shortness of breath.  1 Inhaler  2  . buPROPion (WELLBUTRIN XL) 300 MG 24 hr tablet Take 300 mg by mouth every  morning.       . calcium carbonate (TUMS EX) 750 MG chewable tablet Chew 1 tablet by mouth daily.      . Cholecalciferol (VITAMIN D3) 5000 UNITS CAPS Take 1 capsule by mouth daily.      . clonazePAM (KLONOPIN) 0.5 MG tablet Take 0.5 mg by mouth 3 (three) times daily as needed for anxiety.      . diazepam (VALIUM) 10 MG tablet Take 10 mg by mouth every 6 (six) hours as needed for anxiety.      . DULoxetine (CYMBALTA) 60 MG capsule Take 120 mg by mouth daily before breakfast.       . guaiFENesin-codeine 100-10 MG/5ML syrup Take 5 mLs by mouth at bedtime as needed for cough.  120 mL  0  . levothyroxine (SYNTHROID, LEVOTHROID) 50 MCG tablet Take 50 mcg by mouth every morning.       [name] oxyCODONE-acetaminophen (PERCOCET) 5-325 MG per tablet Take 1 tablet by mouth every 4 (four) hours as needed. pain      . predniSONE (DELTASONE) 20 MG tablet Take 2 tablets (40 mg total) by mouth daily.  10 tablet  0  .  Rivaroxaban (XARELTO) 15 MG TABS tablet Take 1 tablet (15 mg total) by mouth 2 (two) times daily with a meal.  21 tablet  0  . temazepam (RESTORIL) 30 MG capsule Take 30 mg by mouth at bedtime.      [name] warfarin (COUMADIN) 5 MG tablet Take 1 tablet (5 mg total) by mouth daily.  7 tablet  0    Exam:  BP 154/84  Pulse 73  Temp(Src) 97.9 F (36.6 C) (Oral)  Resp 16  SpO2 95% Gen: Well NAD LEFT LOWER EXTREMITY MILDLY SWOLLEN AND RED. MILDLY TENDER. CAPILLARY REFILL AND SENSATION ARE INTACT.    Author: Gwendolyn Fill Service: Vascular Lab Author Type: Cardiovascular Sonographer   Filed: 10/13/2013 8:43 AM Note Time: 10/13/2013 8:41 AM Status: Signed   Editor: Lawrence Marseilles Simonetti (Cardiovascular Sonographer)      *Preliminary Results*  Left lower extremity venous duplex completed.  Left lower extremity is positive for deep vein thrombosis involving a single left posterior tibial vein. There is also a thrombosed varicose vein of the distal medial left calf. There is no evidence of left Baker's  cyst.  Preliminary results called in to [phone], and patient has been instructed to return to Urgent Care for treatment.  10/13/2013 8:42 AM  Gertie Fey, RVT, RDCS, RDMS         Assessment and Plan: 78 y.o. female with DVT of the left lower extremity. This is the patient's third DVT Coordination of this issue was difficult.  The patient's insurance does not pay for Xarelto. Additionally he will require between one in 3 days for prior approval of Lovenox. After lengthy discussion with the patient's insurance company, and care management at the hospital a plan was arranged.  1) care management will cover Xarelto for up to one month. We will use this medication as bridging while the INR becomes therapeutic on warfarin. 2) patient will followup with her primary care provider, however that office is closed Wednesday Thursday and Friday. She has an appointment scheduled for Monday, December 1 at 2:30 in the afternoon. In the meantime she will followup at [facility] urgent care on Friday for INR recheck. Will use 5 mg of warfarin daily.  Discussed warning signs or symptoms. Please see discharge instructions. Patient expresses understanding.      Rodolph Bong, MD 10/13/13 [phone]

## 2013-10-14 MED ORDER — TRAMADOL HCL 50 MG PO TABS: 100.0000 mg | ORAL_TABLET | Freq: Three times a day (TID) | ORAL | Status: DC | PRN

## 2013-10-14 NOTE — ED Notes (Signed)
Patient reported she has oxycodone, didn't know if she could take it--dr Lorenz Coaster wrote script for tramadol and instructed patient could take oxycodone.  Told not to take aleve.  Patient says her daughter will pick up tramadol script tomorrow (11/26)

## 2013-10-17 ENCOUNTER — Encounter (HOSPITAL_COMMUNITY): Payer: Self-pay | Admitting: Emergency Medicine

## 2013-10-17 ENCOUNTER — Emergency Department (INDEPENDENT_AMBULATORY_CARE_PROVIDER_SITE_OTHER)
Admission: EM | Admit: 2013-10-17 | Discharge: 2013-10-17 | Disposition: A | Discharge status: Home / Self Care | Payer: Medicare Other | Source: Home / Self Care | Attending: Emergency Medicine | Admitting: Emergency Medicine

## 2013-10-17 DIAGNOSIS — Z5181 Encounter for therapeutic drug level monitoring: Secondary | ICD-10-CM | POA: Diagnosis not present

## 2013-10-17 DIAGNOSIS — Z7901 Long term (current) use of anticoagulants: Secondary | ICD-10-CM | POA: Diagnosis not present

## 2013-10-17 DIAGNOSIS — I82402 Acute embolism and thrombosis of unspecified deep veins of left lower extremity: Secondary | ICD-10-CM

## 2013-10-17 DIAGNOSIS — I82409 Acute embolism and thrombosis of unspecified deep veins of unspecified lower extremity: Secondary | ICD-10-CM | POA: Diagnosis not present

## 2013-10-17 LAB — POCT I-STAT, CHEM 8
Chloride: 104 mEq/L (ref 96–112)
HCT: 41 % (ref 36.0–46.0)
Hemoglobin: 13.9 g/dL (ref 12.0–15.0)
Potassium: 4.3 mEq/L (ref 3.5–5.1)
Sodium: 139 mEq/L (ref 135–145)
TCO2: 25 mmol/L (ref 0–100)

## 2013-10-17 LAB — PROTIME-INR
INR: 1.63 — ABNORMAL HIGH (ref 0.00–1.49)
Prothrombin Time: 18.9 seconds — ABNORMAL HIGH (ref 11.6–15.2)

## 2013-10-17 MED ORDER — TEMAZEPAM 30 MG PO CAPS: 30.0000 mg | ORAL_CAPSULE | Freq: Every day | ORAL | Status: DC

## 2013-10-17 NOTE — ED Provider Notes (Signed)
Medical screening examination/treatment/procedure(s) were performed by non-physician practitioner and as supervising physician I was immediately available for consultation/collaboration.  Leslee Home, M.D.  Reuben Likes, MD 10/17/13 [phone]

## 2013-10-17 NOTE — ED Notes (Signed)
Here for follow up visit from 11-24 [facility]; positive venous doppler for DVT; needs lab work

## 2013-10-17 NOTE — ED Notes (Signed)
Updated patient on delay 

## 2013-10-17 NOTE — ED Provider Notes (Signed)
CSN: [id number]     Arrival date & time 10/17/13  0907 History   First MD Initiated Contact with Patient 10/17/13 [phone]     Chief Complaint  Patient presents with  . Follow-up   (Consider location/radiation/quality/duration/timing/severity/associated sxs/prior Treatment) HPI Comments: She is here for followup to check INR. She was diagnosed with a DVT earlier this week, started on Xarelto as a bridge to therapeutic INR with her Coumadin, which was also started on Monday. She is currently taking Xarelto 15 mg twice daily and Coumadin, 5 mg daily. She has an appointment with a primary care physician next week.  No chest pain or increasing shortness of breath, she notes that she has had some mild shortness of breath on exertion for the past month. This has not gotten any worse recently. Her leg pain has gotten worse.   Past Medical History  Diagnosis Date  . Hypotension   . Depression with anxiety   . Cough     Wert-onset 08/2009, as of 2014- resolved   . Deep venous thrombosis     post op, rec'd/needed  blood thinner   . Cervicalgia   . Hypothyroidism   . Blood transfusion   . Constipation   . Shortness of breath   . Anxiety   . Dysrhythmia   . Anemia   . Depression   . Lumbar radiculopathy 09/26/2011  . Headache(784.0) 09/27/2011  . Pneumonia     never been in hosp. for pneumonia   . Sleep apnea     doesn't use CPAP any longer, since weight loss  . Arthritis     hands, spine   . History of stress test     15-20 yrs.ago   . Falls frequently     pt. reports that she was falling 3 times a day, has had PT at a facility for a while & now is getting home PT 2-3 times/ week   . Right hand fracture     Monday   Past Surgical History  Procedure Laterality Date  . Replacement total knee bilateral    . Appendectomy    . Breast excisional biopsy      breast bxs on left x 2, breast bx of right x 1-all benign  . Laparoscopic ovarian cystectomy    . Joint replacement  [id number]   bilateral  . Tonsillectomy    . Eye surgery      cataracts bilateral /w IOL  . Hernia repair      umbilical hernia- 2004  . Back surgery      2000  . Anterior cervical decomp/discectomy fusion N/A 07/09/2013    Procedure: ANTERIOR CERVICAL DECOMPRESSION/DISCECTOMY FUSION 3 LEVELS;  Surgeon: Emilee Hero, MD;  Location: [facility] OR;  Service: Orthopedics;  Laterality: N/A;  Anterior cervical decompression fusion, cervical 3-4, cervical 4-5-, cervical 5-6 with instrumentation, allograft.   Family History  Problem Relation Age of Onset  . Heart disease Father   . Malignant hyperthermia Father   . Deep vein thrombosis Son    History  Substance Use Topics  . Smoking status: Former Smoker -- 0.50 packs/day    Types: Cigarettes    Quit date: 11/20/1968  . Smokeless tobacco: Never Used     Comment: smoked for approx. 10 years  . Alcohol Use: 0.6 oz/week    1 Glasses of wine per week     Comment: rare   OB History   Grav Para Term Preterm Abortions TAB SAB Ect Mult Living  Review of Systems  Constitutional: Negative for fever and chills.  Eyes: Negative for visual disturbance.  Respiratory: Positive for shortness of breath (mild, on exertion, for past month.  ). Negative for cough.   Cardiovascular: Positive for leg swelling (and pain, left leg). Negative for chest pain and palpitations.  Gastrointestinal: Negative for nausea, vomiting and abdominal pain.  Endocrine: Negative for polydipsia and polyuria.  Genitourinary: Negative for dysuria, urgency and frequency.  Musculoskeletal: Negative for arthralgias and myalgias.  Skin: Negative for rash.  Neurological: Negative for dizziness, weakness and light-headedness.    Allergies  Clindamycin hcl  Home Medications   Current Outpatient Rx  Name  Route  Sig  Dispense  Refill  . albuterol (PROVENTIL HFA;VENTOLIN HFA) 108 (90 BASE) MCG/ACT inhaler   Inhalation   Inhale 2 puffs into the lungs every 6 (six) hours  as needed for wheezing or shortness of breath.   1 Inhaler   2   . buPROPion (WELLBUTRIN XL) 300 MG 24 hr tablet   Oral   Take 300 mg by mouth every morning.          . calcium carbonate (TUMS EX) 750 MG chewable tablet   Oral   Chew 1 tablet by mouth daily.         . Cholecalciferol (VITAMIN D3) 5000 UNITS CAPS   Oral   Take 1 capsule by mouth daily.         . clonazePAM (KLONOPIN) 0.5 MG tablet   Oral   Take 0.5 mg by mouth 3 (three) times daily as needed for anxiety.         . diazepam (VALIUM) 10 MG tablet   Oral   Take 10 mg by mouth every 6 (six) hours as needed for anxiety.         . DULoxetine (CYMBALTA) 60 MG capsule   Oral   Take 120 mg by mouth daily before breakfast.          . guaiFENesin-codeine 100-10 MG/5ML syrup   Oral   Take 5 mLs by mouth at bedtime as needed for cough.   120 mL   0   . levothyroxine (SYNTHROID, LEVOTHROID) 50 MCG tablet   Oral   Take 50 mcg by mouth every morning.          [name] oxyCODONE-acetaminophen (PERCOCET) 5-325 MG per tablet   Oral   Take 1 tablet by mouth every 4 (four) hours as needed. pain         . predniSONE (DELTASONE) 20 MG tablet   Oral   Take 2 tablets (40 mg total) by mouth daily.   10 tablet   0   . Rivaroxaban (XARELTO) 15 MG TABS tablet   Oral   Take 1 tablet (15 mg total) by mouth 2 (two) times daily with a meal.   21 tablet   0   . temazepam (RESTORIL) 30 MG capsule   Oral   Take 1 capsule (30 mg total) by mouth at bedtime.   7 capsule   0   . traMADol (ULTRAM) 50 MG tablet   Oral   Take 2 tablets (100 mg total) by mouth every 8 (eight) hours as needed.   30 tablet   0   . warfarin (COUMADIN) 5 MG tablet   Oral   Take 1 tablet (5 mg total) by mouth daily.   7 tablet   0    BP 136/66  Pulse 74  Temp(Src) 98 F (36.7 C) (  Oral)  Resp 20  SpO2 100% Physical Exam  Nursing note and vitals reviewed. Constitutional: She is oriented to person, place, and time. Vital signs  are normal. She appears well-developed and well-nourished. No distress.  HENT:  Head: Normocephalic and atraumatic.  Cardiovascular: Normal rate, regular rhythm and normal heart sounds.   Pulmonary/Chest: Effort normal and breath sounds normal. No respiratory distress. She has no wheezes. She has no rales.  Musculoskeletal:       Left lower leg: She exhibits swelling (with erythema and TTP) and edema.  Neurological: She is alert and oriented to person, place, and time. She has normal strength. Coordination normal.  Skin: Skin is warm and dry. No rash noted. She is not diaphoretic.  Psychiatric: She has a normal mood and affect. Judgment normal.    ED Course  Procedures (including critical care time) Labs Review Labs Reviewed  PROTIME-INR - Abnormal; Notable for the following:    Prothrombin Time 18.9 (*)    INR 1.63 (*)    All other components within normal limits  POCT I-STAT, CHEM 8 - Abnormal; Notable for the following:    BUN 35 (*)    Glucose, Bld 109 (*)    All other components within normal limits  APTT   Imaging Review No results found.  LEFT LOWER EXTREMITY MILDLY SWOLLEN AND RED. MILDLY TENDER. CAPILLARY REFILL AND SENSATION ARE INTACT.  Author: Gwendolyn Fill  Service: Vascular Lab  Author Type: Cardiovascular Sonographer     Filed: 10/13/2013 8:43 AM  Note Time: 10/13/2013 8:41 AM  Status: Signed    Editor: Lawrence Marseilles Simonetti (Cardiovascular Sonographer)       *Preliminary Results*  Left lower extremity venous duplex completed.  Left lower extremity is positive for deep vein thrombosis involving a single left posterior tibial vein. There is also a thrombosed varicose vein of the distal medial left calf. There is no evidence of left Blanchie Zeleznik's cyst.  Preliminary results called in to [phone], and patient has been instructed to return to Urgent Care for treatment.  10/13/2013 8:42 AM  Gertie Fey, RVT, RDCS, RDMS       MDM   1. DVT (deep venous  thrombosis), left   2. Anticoagulation goal of INR 2 to 3     BUN/Cr just slightly elevated over her baseline.  Encourage hydration.   INR is 1.63, not yet therapeutic. Continue xarelto and coumadin, re-check INR in 3 days either here or with PCP.   Refilling 1 wk of restoril.     Graylon Good, PA-C 10/17/13 1125

## 2013-10-20 ENCOUNTER — Emergency Department (INDEPENDENT_AMBULATORY_CARE_PROVIDER_SITE_OTHER)
Admission: EM | Admit: 2013-10-20 | Discharge: 2013-10-20 | Disposition: A | Discharge status: Home / Self Care | Payer: Medicare Other | Source: Home / Self Care | Attending: Emergency Medicine | Admitting: Emergency Medicine

## 2013-10-20 ENCOUNTER — Encounter (HOSPITAL_COMMUNITY): Payer: Self-pay | Admitting: Emergency Medicine

## 2013-10-20 DIAGNOSIS — Z5181 Encounter for therapeutic drug level monitoring: Secondary | ICD-10-CM | POA: Diagnosis not present

## 2013-10-20 DIAGNOSIS — I82402 Acute embolism and thrombosis of unspecified deep veins of left lower extremity: Secondary | ICD-10-CM

## 2013-10-20 DIAGNOSIS — Z7901 Long term (current) use of anticoagulants: Secondary | ICD-10-CM

## 2013-10-20 DIAGNOSIS — I82409 Acute embolism and thrombosis of unspecified deep veins of unspecified lower extremity: Secondary | ICD-10-CM

## 2013-10-20 LAB — POCT I-STAT, CHEM 8
Calcium, Ion: 1.26 mmol/L (ref 1.13–1.30)
Creatinine, Ser: 1.1 mg/dL (ref 0.50–1.10)
HCT: 41 % (ref 36.0–46.0)
Hemoglobin: 13.9 g/dL (ref 12.0–15.0)
Sodium: 141 mEq/L (ref 135–145)
TCO2: 28 mmol/L (ref 0–100)

## 2013-10-20 LAB — PROTIME-INR: INR: 4.02 — ABNORMAL HIGH (ref 0.00–1.49)

## 2013-10-20 NOTE — ED Notes (Signed)
Follow up Blood work

## 2013-10-20 NOTE — ED Provider Notes (Signed)
CSN: [id number]     Arrival date & time 10/20/13  9562 History   First MD Initiated Contact with Patient 10/20/13 [phone]     Chief Complaint  Patient presents with  . Follow-up   (Consider location/radiation/quality/duration/timing/severity/associated sxs/prior Treatment) HPI Comments: 78 year old female presents for recheck of her INR. She reports increasing pain in the left foot. No chest pain or shortness of breath. She has been taking the Coumadin once daily and the Xarelto twice daily.   Past Medical History  Diagnosis Date  . Hypotension   . Depression with anxiety   . Cough     Wert-onset 08/2009, as of 2014- resolved   . Deep venous thrombosis     post op, rec'd/needed  blood thinner   . Cervicalgia   . Hypothyroidism   . Blood transfusion   . Constipation   . Shortness of breath   . Anxiety   . Dysrhythmia   . Anemia   . Depression   . Lumbar radiculopathy 09/26/2011  . Headache(784.0) 09/27/2011  . Pneumonia     never been in hosp. for pneumonia   . Sleep apnea     doesn't use CPAP any longer, since weight loss  . Arthritis     hands, spine   . History of stress test     15-20 yrs.ago   . Falls frequently     pt. reports that she was falling 3 times a day, has had PT at a facility for a while & now is getting home PT 2-3 times/ week   . Right hand fracture     Monday   Past Surgical History  Procedure Laterality Date  . Replacement total knee bilateral    . Appendectomy    . Breast excisional biopsy      breast bxs on left x 2, breast bx of right x 1-all benign  . Laparoscopic ovarian cystectomy    . Joint replacement  [id number]    bilateral  . Tonsillectomy    . Eye surgery      cataracts bilateral /w IOL  . Hernia repair      umbilical hernia- 2004  . Back surgery      2000  . Anterior cervical decomp/discectomy fusion N/A 07/09/2013    Procedure: ANTERIOR CERVICAL DECOMPRESSION/DISCECTOMY FUSION 3 LEVELS;  Surgeon: Emilee Hero, MD;   Location: [facility] OR;  Service: Orthopedics;  Laterality: N/A;  Anterior cervical decompression fusion, cervical 3-4, cervical 4-5-, cervical 5-6 with instrumentation, allograft.   Family History  Problem Relation Age of Onset  . Heart disease Father   . Malignant hyperthermia Father   . Deep vein thrombosis Son    History  Substance Use Topics  . Smoking status: Former Smoker -- 0.50 packs/day    Types: Cigarettes    Quit date: 11/20/1968  . Smokeless tobacco: Never Used     Comment: smoked for approx. 10 years  . Alcohol Use: 0.6 oz/week    1 Glasses of wine per week     Comment: rare   OB History   Grav Para Term Preterm Abortions TAB SAB Ect Mult Living                 Review of Systems  Constitutional: Negative for fever and chills.  Eyes: Negative for visual disturbance.  Respiratory: Negative for cough and shortness of breath.   Cardiovascular: Positive for leg swelling (and pain). Negative for chest pain and palpitations.  Gastrointestinal: Negative for nausea, vomiting and  abdominal pain.  Endocrine: Negative for polydipsia and polyuria.  Genitourinary: Negative for dysuria, urgency and frequency.  Musculoskeletal: Negative for arthralgias and myalgias.  Skin: Negative for rash.  Neurological: Negative for dizziness, weakness and light-headedness.    Allergies  Clindamycin hcl  Home Medications   Current Outpatient Rx  Name  Route  Sig  Dispense  Refill  . albuterol (PROVENTIL HFA;VENTOLIN HFA) 108 (90 BASE) MCG/ACT inhaler   Inhalation   Inhale 2 puffs into the lungs every 6 (six) hours as needed for wheezing or shortness of breath.   1 Inhaler   2   . buPROPion (WELLBUTRIN XL) 300 MG 24 hr tablet   Oral   Take 300 mg by mouth every morning.          . calcium carbonate (TUMS EX) 750 MG chewable tablet   Oral   Chew 1 tablet by mouth daily.         . Cholecalciferol (VITAMIN D3) 5000 UNITS CAPS   Oral   Take 1 capsule by mouth daily.         .  clonazePAM (KLONOPIN) 0.5 MG tablet   Oral   Take 0.5 mg by mouth 3 (three) times daily as needed for anxiety.         . diazepam (VALIUM) 10 MG tablet   Oral   Take 10 mg by mouth every 6 (six) hours as needed for anxiety.         . DULoxetine (CYMBALTA) 60 MG capsule   Oral   Take 120 mg by mouth daily before breakfast.          . guaiFENesin-codeine 100-10 MG/5ML syrup   Oral   Take 5 mLs by mouth at bedtime as needed for cough.   120 mL   0   . levothyroxine (SYNTHROID, LEVOTHROID) 50 MCG tablet   Oral   Take 50 mcg by mouth every morning.          [name] oxyCODONE-acetaminophen (PERCOCET) 5-325 MG per tablet   Oral   Take 1 tablet by mouth every 4 (four) hours as needed. pain         . predniSONE (DELTASONE) 20 MG tablet   Oral   Take 2 tablets (40 mg total) by mouth daily.   10 tablet   0   . Rivaroxaban (XARELTO) 15 MG TABS tablet   Oral   Take 1 tablet (15 mg total) by mouth 2 (two) times daily with a meal.   21 tablet   0   . temazepam (RESTORIL) 30 MG capsule   Oral   Take 1 capsule (30 mg total) by mouth at bedtime.   7 capsule   0   . traMADol (ULTRAM) 50 MG tablet   Oral   Take 2 tablets (100 mg total) by mouth every 8 (eight) hours as needed.   30 tablet   0   . warfarin (COUMADIN) 5 MG tablet   Oral   Take 1 tablet (5 mg total) by mouth daily.   7 tablet   0    BP 135/65  Pulse 74  Temp(Src) 98.6 F (37 C) (Oral)  Resp 18  SpO2 99% Physical Exam  Nursing note and vitals reviewed. Constitutional: She is oriented to person, place, and time. Vital signs are normal. She appears well-developed and well-nourished. No distress.  HENT:  Head: Normocephalic and atraumatic.  Cardiovascular: Normal rate, regular rhythm and intact distal pulses.  Exam reveals no gallop and  no friction rub.   Murmur heard.  Decrescendo systolic murmur is present with a grade of 2/6  2+ pitting edema over the left ankle. Tender, palpable cord in the medial  left leg and in the back of the left calf up to the knee. No tenderness above the level of the knee.  Pulmonary/Chest: Effort normal and breath sounds normal. No respiratory distress. She has no wheezes. She has no rales.  Neurological: She is alert and oriented to person, place, and time. She has normal strength. Coordination normal.  Skin: Skin is warm and dry. No rash noted. She is not diaphoretic.  Psychiatric: She has a normal mood and affect. Judgment normal.    ED Course  Procedures (including critical care time) Labs Review Labs Reviewed  PROTIME-INR - Abnormal; Notable for the following:    Prothrombin Time 37.6 (*)    INR 4.02 (*)    All other components within normal limits  POCT I-STAT, CHEM 8 - Abnormal; Notable for the following:    Glucose, Bld 102 (*)    All other components within normal limits   Imaging Review No results found.  EKG Interpretation    Date/Time:    Ventricular Rate:    PR Interval:    QRS Duration:   QT Interval:    QTC Calculation:   R Axis:     Text Interpretation:              MDM   1. DVT (deep venous thrombosis), left   2. Anticoagulation goal of INR 2.5 to 3.5    Hold Coumadin and Xarelto today and tomorrow. Followup at the Coumadin clinic tomorrow to recheck INR. I have spoken with a clinic manager at [facility], they will see this patient tomorrow.  Graylon Good, PA-C 10/20/13 [phone]

## 2013-10-20 NOTE — ED Provider Notes (Signed)
Medical screening examination/treatment/procedure(s) were performed by non-physician practitioner and as supervising physician I was immediately available for consultation/collaboration.  Shemika Robbs, M.D.  Timonthy Hovater C Conda Wannamaker, MD 10/20/13 [id number] 

## 2013-10-21 ENCOUNTER — Other Ambulatory Visit: Payer: Medicare Other

## 2013-10-21 ENCOUNTER — Other Ambulatory Visit: Payer: Self-pay | Admitting: General Practice

## 2013-10-21 ENCOUNTER — Ambulatory Visit (INDEPENDENT_AMBULATORY_CARE_PROVIDER_SITE_OTHER): Payer: Medicare Other | Admitting: General Practice

## 2013-10-21 DIAGNOSIS — Z7901 Long term (current) use of anticoagulants: Secondary | ICD-10-CM

## 2013-10-21 LAB — POCT INR: INR: 1.7

## 2013-10-21 MED ORDER — WARFARIN SODIUM 5 MG PO TABS: ORAL_TABLET | ORAL | Status: DC

## 2013-10-21 NOTE — Progress Notes (Signed)
Pre-visit discussion using our clinic review tool. No additional management support is needed unless otherwise documented below in the visit note.  

## 2013-10-24 ENCOUNTER — Ambulatory Visit (INDEPENDENT_AMBULATORY_CARE_PROVIDER_SITE_OTHER): Payer: Medicare Other | Admitting: General Practice

## 2013-10-24 ENCOUNTER — Encounter: Payer: Self-pay | Admitting: Internal Medicine

## 2013-10-24 ENCOUNTER — Ambulatory Visit (INDEPENDENT_AMBULATORY_CARE_PROVIDER_SITE_OTHER): Payer: Medicare Other | Admitting: Internal Medicine

## 2013-10-24 VITALS — BP 140/92 | HR 64 | Temp 98.1°F | Ht 65.0 in | Wt 212.0 lb

## 2013-10-24 DIAGNOSIS — I82402 Acute embolism and thrombosis of unspecified deep veins of left lower extremity: Secondary | ICD-10-CM

## 2013-10-24 DIAGNOSIS — I82409 Acute embolism and thrombosis of unspecified deep veins of unspecified lower extremity: Secondary | ICD-10-CM

## 2013-10-24 DIAGNOSIS — I679 Cerebrovascular disease, unspecified: Secondary | ICD-10-CM

## 2013-10-24 DIAGNOSIS — R0609 Other forms of dyspnea: Secondary | ICD-10-CM

## 2013-10-24 DIAGNOSIS — Z7901 Long term (current) use of anticoagulants: Secondary | ICD-10-CM

## 2013-10-24 LAB — POCT INR: INR: 3.9

## 2013-10-24 NOTE — Progress Notes (Signed)
   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 Anterior cervical decompression and fusion C3-4, C4-5, C5-6.  2. Placement of anterior instrumentation, C3-C6.  3. Insertion of interbody device x3 (Titan interbody spacers)  4. Use of morselized allograft (DBX mix).  At time of initial ov Much better falling spells but doe x room to room x one year, no more leg pain and never had pleuritic cp or hempotysis.  Does have mild chronic day > night dry cough for over a year, cough and sob ? Some better p saba.   No obvious day to day or daytime variabilty or assoc chronic cough or cp or chest tightness, subjective wheeze overt sinus or hb symptoms. No unusual exp hx or h/o childhood pna/ asthma or knowledge of premature birth.  Sleeping ok without nocturnal  or early am exacerbation  of respiratory  c/o's or need for noct saba. Also denies any obvious fluctuation of symptoms with weather or environmental changes or other aggravating or alleviating factors except as outlined above   Current Medications, Allergies, Complete Past Medical History, Past Surgical History, Family History, and Social History were reviewed in [employer] record.           Review of Systems  Constitutional: Negative for fever and unexpected weight change.  HENT: Negative for congestion, dental problem, ear pain, nosebleeds, postnasal drip, rhinorrhea, sinus pressure, sneezing, sore throat and trouble swallowing.   Eyes: Negative for redness and itching.   Respiratory: Positive for cough and shortness of breath. Negative for chest tightness and wheezing.   Cardiovascular: Positive for leg swelling. Negative for palpitations.  Gastrointestinal: Negative for nausea and vomiting.  Genitourinary: Negative for dysuria.  Musculoskeletal: Negative for joint swelling.  Skin: Negative for rash.  Neurological: Negative for headaches.  Hematological: Bruises/bleeds easily.  Psychiatric/Behavioral: Positive for dysphoric mood. The patient is nervous/anxious.        Objective:   Physical Exam Wt Readings from Last 3 Encounters:  10/24/13 212 lb (96.163 kg)  07/09/13 236 lb (107.049 kg)  07/09/13 236 lb (107.049 kg)       HEENT: nl dentition, turbinates, and orophanx. Nl external ear canals without cough reflex   NECK :  without JVD/Nodes/TM/ nl carotid upstrokes bilaterally   LUNGS: no acc muscle use, clear to A and P bilaterally without cough on insp or exp maneuvers   CV:  RRR  no s3 or murmur or increase in P2, no edema   ABD:  soft and nontender with nl excursion in the supine position. No bruits or organomegaly, bowel sounds nl  MS:  warm without deformities, calf tenderness, cyanosis or clubbing  SKIN: warm and dry without lesions    NEURO:  alert, approp, no deficits     cxr 09/26/13  No active cardiopulmonary disease     Assessment & Plan:

## 2013-10-24 NOTE — Patient Instructions (Signed)
Please see patient coordinator before you leave today  to schedule echocardiogram  Based on the recurrence you need lifelong coumadin but as you get older the risks of falling will go up and may need a filter one day - for now just be very careful to do everything you can to reduce your falling risks  Moderate elastic knee high stockings ([employer] in [place] has the cheapest!) would be fine and keep your legs elevated as much as possible

## 2013-10-24 NOTE — Progress Notes (Signed)
Pre-visit discussion using our clinic review tool. No additional management support is needed unless otherwise documented below in the visit note.  

## 2013-10-26 ENCOUNTER — Encounter: Payer: Self-pay | Admitting: Internal Medicine

## 2013-10-26 NOTE — Assessment & Plan Note (Signed)
10/13/13 Venous doppler consistent with acute deep vein thrombosis involving a single left posterial tibial vein. There is also evidence of a thrombosed varicose vein of the left distal medial calf  This is either the 3rd or 4th dvt in pt with obesity as the main risk factor but regardless at this point needs lifelong coumadin or an IVC filter should she not be able to tolerate anticoagulation for any reasons ( the falling spells she assures be are resolved p cervical spine surgery)  See instructions for specific recommendations which were reviewed directly with the patient who was given a copy with highlighter outlining the key components.

## 2013-10-26 NOTE — Assessment & Plan Note (Signed)
-   recurrent dvt L 10/13/2013  - 10/24/2013  Walked RA x 1 laps @ 185 ft each stopped due to too tired, not sob, no desat   - Echo 10/24/2013 ordered>>>  Concern re occult TEPAH in pt with recurrent dvt but not about dx of new pe as she has just been restarted on coumadin and as long as echo is fine there is no change needed in recs - if shows pah needs to be repeated in 6 months on anticoagulation

## 2013-10-31 ENCOUNTER — Ambulatory Visit (INDEPENDENT_AMBULATORY_CARE_PROVIDER_SITE_OTHER): Payer: Medicare Other | Admitting: General Practice

## 2013-10-31 DIAGNOSIS — I82409 Acute embolism and thrombosis of unspecified deep veins of unspecified lower extremity: Secondary | ICD-10-CM

## 2013-10-31 DIAGNOSIS — Z7901 Long term (current) use of anticoagulants: Secondary | ICD-10-CM

## 2013-10-31 NOTE — Progress Notes (Signed)
Pre-visit discussion using our clinic review tool. No additional management support is needed unless otherwise documented below in the visit note.  

## 2013-11-03 ENCOUNTER — Ambulatory Visit (INDEPENDENT_AMBULATORY_CARE_PROVIDER_SITE_OTHER): Payer: Medicare Other | Admitting: Internal Medicine

## 2013-11-03 ENCOUNTER — Encounter: Payer: Self-pay | Admitting: Internal Medicine

## 2013-11-03 ENCOUNTER — Encounter (INDEPENDENT_AMBULATORY_CARE_PROVIDER_SITE_OTHER): Payer: Self-pay

## 2013-11-03 ENCOUNTER — Telehealth: Payer: Self-pay | Admitting: Internal Medicine

## 2013-11-03 VITALS — BP 130/78 | HR 84 | Temp 99.1°F | Resp 16 | Ht 64.5 in

## 2013-11-03 DIAGNOSIS — R0609 Other forms of dyspnea: Secondary | ICD-10-CM

## 2013-11-03 DIAGNOSIS — I82402 Acute embolism and thrombosis of unspecified deep veins of left lower extremity: Secondary | ICD-10-CM

## 2013-11-03 DIAGNOSIS — I82409 Acute embolism and thrombosis of unspecified deep veins of unspecified lower extremity: Secondary | ICD-10-CM

## 2013-11-03 MED ORDER — MELOXICAM 7.5 MG PO TABS: 7.5000 mg | ORAL_TABLET | Freq: Every day | ORAL | Status: DC

## 2013-11-03 NOTE — Telephone Encounter (Signed)
Pt is calling back

## 2013-11-03 NOTE — Telephone Encounter (Signed)
Very unlikely on therapeutic coumadin but def needs ov asap

## 2013-11-03 NOTE — Progress Notes (Signed)
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 Anterior cervical decompression and fusion C3-4, C4-5, C5-6.  2. Placement of anterior instrumentation, C3-C6.  3. Insertion of interbody device x3 (Titan interbody spacers)  4. Use of morselized allograft (DBX mix).  At time of initial ov Much better falling spells but doe x room to room x one year, no more leg pain and never had pleuritic cp or hempotysis.  Does have mild chronic day > night dry cough for over a year, cough and sob ? Some better p saba.   11/03/2013 acute  ov/Wert re: calf pain Chief Complaint  Patient presents with  . Anticoagulation   still has not established with primary md abuptly this am  L medial shin  pain  With noted  Redness, no fever, swellling or cp/ no change in doe INR 3.3  10/31/13  No obvious day to day or daytime variabilty or assoc chronic cough or cp or chest tightness, subjective wheeze overt sinus or hb symptoms. No unusual exp hx or h/o childhood pna/ asthma or knowledge of premature birth.  Sleeping ok without nocturnal  or early am exacerbation  of respiratory  c/o's or need for noct saba. Also denies any obvious fluctuation of symptoms with weather or environmental changes or other aggravating or alleviating factors except as outlined above   Current Medications, Allergies, Complete Past Medical History, Past Surgical History, Family History, and Social History were reviewed in  [employer] record.  ROS  The following are not active complaints unless bolded sore throat, dysphagia, dental problems, itching, sneezing,  nasal congestion or excess/ purulent secretions, ear ache,   fever, chills, sweats, unintended wt loss, pleuritic or exertional cp, hemoptysis,  orthopnea pnd or leg swelling, presyncope, palpitations, heartburn, abdominal pain, anorexia, nausea, vomiting, diarrhea  or change in bowel or urinary habits, change in stools or urine, dysuria,hematuria,  rash, arthralgias, visual complaints, headache, numbness weakness or ataxia or problems with walking or coordination,  change in mood/affect or memory.                    Objective:   Physical Exam  .11/03/2013    Refused  Wt Readings from Last 3 Encounters:  10/24/13 212 lb (96.163 kg)  07/09/13 236 lb (107.049 kg)  07/09/13 236 lb (107.049 kg)       HEENT: nl dentition, turbinates, and orophanx. Nl external ear canals without cough reflex   NECK :  without JVD/Nodes/TM/ nl carotid upstrokes bilaterally   LUNGS: no acc muscle use, clear to A and P bilaterally without cough on insp or exp maneuvers   CV:  RRR  no s3 or murmur or increase in P2, no edema   ABD:  soft and nontender with nl excursion in the supine position. No bruits or organomegaly, bowel sounds nl  MS:  warm without deformities,   cyanosis  or clubbing- min erythema and tenderness distal medial calf neg homans and no swelling   SKIN: warm and dry without lesions    NEURO:  alert, approp, no deficits     cxr 09/26/13  No active cardiopulmonary disease     Assessment & Plan:

## 2013-11-03 NOTE — Telephone Encounter (Signed)
[id number] to add on to MW schedule today per MW. Carron Curie, CMA

## 2013-11-03 NOTE — Telephone Encounter (Signed)
I spoke with the pt and she states that on her left leg where her blood clot was or is she states she is having sever pain. She states that the area is also red and hurts when she touches it. Pt is concerned that she has a new clot of the old one has moved. Please advise OV? Hospital?. Carron Curie, [place]

## 2013-11-03 NOTE — Patient Instructions (Signed)
mobic  7.5 mg twice daily with meals as needed for leg pain and ok to supplement with percocet if needed   Keep the leg as elevated as possible   Pulmonary follow up is as needed

## 2013-11-03 NOTE — Telephone Encounter (Signed)
Pt has appt at 2;00 today with wert pt aware

## 2013-11-05 NOTE — Assessment & Plan Note (Signed)
-   recurrent dvt L 10/13/2013  - 10/24/2013  Walked RA x 1 laps @ 185 ft each stopped due to too tired, not sob, no desat   - Echo 10/24/2013 ordered>>>   Reminded needs to keep appt for f/u Echo to r/o [facility]

## 2013-11-05 NOTE — Assessment & Plan Note (Signed)
10/13/13 Venous doppler consistent with acute deep vein thrombosis involving a single left posterial tibial vein. There is also evidence of a thrombosed varicose vein of the left distal medial calf.  Most likely this is post-phlebitic syndrome or sup phlebitis in the thrombosed varicose vein descibed above  rx is elevation/ heat/ mobic/ See instructions for specific recommendations which were reviewed directly with the patient who was given a copy with highlighter outlining the key components.   No change coumdin rx   F/u prn if not improving by end of week

## 2013-11-07 ENCOUNTER — Ambulatory Visit (INDEPENDENT_AMBULATORY_CARE_PROVIDER_SITE_OTHER): Payer: Medicare Other | Admitting: General Practice

## 2013-11-07 DIAGNOSIS — Z7901 Long term (current) use of anticoagulants: Secondary | ICD-10-CM

## 2013-11-07 DIAGNOSIS — Z5181 Encounter for therapeutic drug level monitoring: Secondary | ICD-10-CM

## 2013-11-07 DIAGNOSIS — I82409 Acute embolism and thrombosis of unspecified deep veins of unspecified lower extremity: Secondary | ICD-10-CM

## 2013-11-07 NOTE — Progress Notes (Signed)
Pre-visit discussion using our clinic review tool. No additional management support is needed unless otherwise documented below in the visit note.  

## 2013-11-11 ENCOUNTER — Other Ambulatory Visit (HOSPITAL_COMMUNITY): Payer: Self-pay | Admitting: Cardiology

## 2013-11-11 ENCOUNTER — Ambulatory Visit (HOSPITAL_COMMUNITY): Payer: Medicare Other | Attending: Internal Medicine | Admitting: Cardiology

## 2013-11-11 ENCOUNTER — Encounter: Payer: Self-pay | Admitting: Internal Medicine

## 2013-11-11 DIAGNOSIS — R0609 Other forms of dyspnea: Secondary | ICD-10-CM

## 2013-11-11 DIAGNOSIS — I059 Rheumatic mitral valve disease, unspecified: Secondary | ICD-10-CM | POA: Insufficient documentation

## 2013-11-11 DIAGNOSIS — Z87891 Personal history of nicotine dependence: Secondary | ICD-10-CM | POA: Insufficient documentation

## 2013-11-11 DIAGNOSIS — R0989 Other specified symptoms and signs involving the circulatory and respiratory systems: Secondary | ICD-10-CM | POA: Insufficient documentation

## 2013-11-11 NOTE — Progress Notes (Signed)
Echo performed. 

## 2013-11-21 ENCOUNTER — Ambulatory Visit (INDEPENDENT_AMBULATORY_CARE_PROVIDER_SITE_OTHER): Payer: Medicare Other | Admitting: General Practice

## 2013-11-21 DIAGNOSIS — Z7901 Long term (current) use of anticoagulants: Secondary | ICD-10-CM | POA: Diagnosis not present

## 2013-11-21 DIAGNOSIS — Z5181 Encounter for therapeutic drug level monitoring: Secondary | ICD-10-CM | POA: Diagnosis not present

## 2013-11-21 DIAGNOSIS — I82409 Acute embolism and thrombosis of unspecified deep veins of unspecified lower extremity: Secondary | ICD-10-CM | POA: Diagnosis not present

## 2013-11-21 LAB — POCT INR: INR: 3.3

## 2013-11-21 NOTE — Progress Notes (Signed)
Pre-visit discussion using our clinic review tool. No additional management support is needed unless otherwise documented below in the visit note.  

## 2013-11-23 ENCOUNTER — Emergency Department (HOSPITAL_COMMUNITY): Payer: Medicare Other

## 2013-11-23 ENCOUNTER — Emergency Department (HOSPITAL_COMMUNITY)
Admission: EM | Admit: 2013-11-23 | Discharge: 2013-11-23 | Disposition: A | Discharge status: Home / Self Care | Payer: Medicare Other | Attending: Emergency Medicine | Admitting: Emergency Medicine

## 2013-11-23 ENCOUNTER — Encounter (HOSPITAL_COMMUNITY): Payer: Self-pay | Admitting: Emergency Medicine

## 2013-11-23 DIAGNOSIS — S0093XA Contusion of unspecified part of head, initial encounter: Secondary | ICD-10-CM

## 2013-11-23 DIAGNOSIS — I959 Hypotension, unspecified: Secondary | ICD-10-CM | POA: Diagnosis not present

## 2013-11-23 DIAGNOSIS — Z87891 Personal history of nicotine dependence: Secondary | ICD-10-CM | POA: Diagnosis not present

## 2013-11-23 DIAGNOSIS — M129 Arthropathy, unspecified: Secondary | ICD-10-CM | POA: Diagnosis not present

## 2013-11-23 DIAGNOSIS — R51 Headache: Secondary | ICD-10-CM | POA: Diagnosis not present

## 2013-11-23 DIAGNOSIS — S0083XA Contusion of other part of head, initial encounter: Principal | ICD-10-CM

## 2013-11-23 DIAGNOSIS — Z86718 Personal history of other venous thrombosis and embolism: Secondary | ICD-10-CM | POA: Diagnosis not present

## 2013-11-23 DIAGNOSIS — S0003XA Contusion of scalp, initial encounter: Secondary | ICD-10-CM | POA: Insufficient documentation

## 2013-11-23 DIAGNOSIS — Z96659 Presence of unspecified artificial knee joint: Secondary | ICD-10-CM | POA: Insufficient documentation

## 2013-11-23 DIAGNOSIS — Z79899 Other long term (current) drug therapy: Secondary | ICD-10-CM | POA: Insufficient documentation

## 2013-11-23 DIAGNOSIS — Z7901 Long term (current) use of anticoagulants: Secondary | ICD-10-CM | POA: Insufficient documentation

## 2013-11-23 DIAGNOSIS — W010XXA Fall on same level from slipping, tripping and stumbling without subsequent striking against object, initial encounter: Secondary | ICD-10-CM | POA: Insufficient documentation

## 2013-11-23 DIAGNOSIS — Y92009 Unspecified place in unspecified non-institutional (private) residence as the place of occurrence of the external cause: Secondary | ICD-10-CM | POA: Insufficient documentation

## 2013-11-23 DIAGNOSIS — S0990XA Unspecified injury of head, initial encounter: Secondary | ICD-10-CM | POA: Diagnosis not present

## 2013-11-23 DIAGNOSIS — S1093XA Contusion of unspecified part of neck, initial encounter: Principal | ICD-10-CM

## 2013-11-23 DIAGNOSIS — Y9389 Activity, other specified: Secondary | ICD-10-CM | POA: Insufficient documentation

## 2013-11-23 DIAGNOSIS — F341 Dysthymic disorder: Secondary | ICD-10-CM | POA: Insufficient documentation

## 2013-11-23 DIAGNOSIS — E039 Hypothyroidism, unspecified: Secondary | ICD-10-CM | POA: Diagnosis not present

## 2013-11-23 LAB — CBC
HCT: 37.5 % (ref 36.0–46.0)
Hemoglobin: 12.4 g/dL (ref 12.0–15.0)
MCH: 27.7 pg (ref 26.0–34.0)
MCHC: 33.1 g/dL (ref 30.0–36.0)
MCV: 83.7 fL (ref 78.0–100.0)
PLATELETS: 251 10*3/uL (ref 150–400)
RBC: 4.48 MIL/uL (ref 3.87–5.11)
RDW: 14.1 % (ref 11.5–15.5)
WBC: 6.7 10*3/uL (ref 4.0–10.5)

## 2013-11-23 LAB — BASIC METABOLIC PANEL
BUN: 23 mg/dL (ref 6–23)
CALCIUM: 8.6 mg/dL (ref 8.4–10.5)
CO2: 26 mEq/L (ref 19–32)
Chloride: 101 mEq/L (ref 96–112)
Creatinine, Ser: 0.79 mg/dL (ref 0.50–1.10)
GFR calc Af Amer: >90 mL/min (ref >90)
GFR, EST NON AFRICAN AMERICAN: 79 mL/min — AB (ref >90)
GLUCOSE: 112 mg/dL — AB (ref 70–99)
Potassium: 4 mEq/L (ref 3.7–5.3)
SODIUM: 138 meq/L (ref 137–147)

## 2013-11-23 LAB — PROTIME-INR
INR: 1.8 — ABNORMAL HIGH (ref 0.00–1.49)
PROTHROMBIN TIME: 20.4 s — AB (ref 11.6–15.2)

## 2013-11-23 MED ORDER — OXYCODONE-ACETAMINOPHEN 5-325 MG PO TABS
1.0000 | ORAL_TABLET | Freq: Once | ORAL | Status: AC
Start: 2013-11-23 — End: 2013-11-23
  Administered 2013-11-23: 1 via ORAL
  Filled 2013-11-23: qty 1

## 2013-11-23 NOTE — Discharge Instructions (Signed)
Head Injury, Adult You have had a head injury that does not appear serious at this time. A concussion is a state of changed mental ability, usually from a blow to the head. You should take clear liquids for the rest of the day and then resume your regular diet. You should not take sedatives or alcoholic beverages for as long as directed by your caregiver after discharge. After injuries such as yours, most problems occur within the first 24 hours. SYMPTOMS These minor symptoms may be experienced after discharge:  Memory difficulties.  Dizziness.  Headaches.  Double vision.  Hearing difficulties.  Depression.  Tiredness.  Weakness.  Difficulty with concentration. If you experience any of these problems, you should not be alarmed. A concussion requires a few days for recovery. Many patients with head injuries frequently experience such symptoms. Usually, these problems disappear without medical care. If symptoms last for more than one day, notify your caregiver. See your caregiver sooner if symptoms are becoming worse rather than better. HOME CARE INSTRUCTIONS   During the next 24 hours you must stay with someone who can watch you for the warning signs listed below. Although it is unlikely that serious side effects will occur, you should be aware of signs and symptoms which may necessitate your return to this location. Side effects may occur up to 7  10 days following the injury. It is important for you to carefully monitor your condition and contact your caregiver or seek immediate medical attention if there is a change in your condition. SEEK IMMEDIATE MEDICAL CARE IF:   There is confusion or drowsiness.  You can not awaken the injured person.  There is nausea (feeling sick to your stomach) or continued, forceful vomiting.  You notice dizziness or unsteadiness which is getting worse, or inability to walk.  You have convulsions or unconsciousness.  You experience severe,  persistent headaches not relieved by over-the-counter or prescription medicines for pain. (Do not take aspirin as this impairs clotting abilities). Take other pain medications only as directed.  You can not use arms or legs normally.  There is clear or bloody discharge from the nose or ears. MAKE SURE YOU:   Understand these instructions.  Will watch your condition.  Will get help right away if you are not doing well or get worse. Document Released: 11/06/2005 Document Revised: 01/29/2012 Document Reviewed: 09/24/2009 [facility] Patient Information 2014 [place].  Contusion A contusion is a deep bruise. Contusions are the result of an injury that caused bleeding under the skin. The contusion may turn blue, purple, or yellow. Minor injuries will give you a painless contusion, but more severe contusions may stay painful and swollen for a few weeks.  CAUSES  A contusion is usually caused by a blow, trauma, or direct force to an area of the body. SYMPTOMS   Swelling and redness of the injured area.  Bruising of the injured area.  Tenderness and soreness of the injured area.  Pain. DIAGNOSIS  The diagnosis can be made by taking a history and physical exam. An X-ray, CT scan, or MRI may be needed to determine if there were any associated injuries, such as fractures. TREATMENT  Specific treatment will depend on what area of the body was injured. In general, the best treatment for a contusion is resting, icing, elevating, and applying cold compresses to the injured area. Over-the-counter medicines may also be recommended for pain control. Ask your caregiver what the best treatment is for your contusion. HOME CARE INSTRUCTIONS  Put ice on the injured area.  Put ice in a plastic bag.  Place a towel between your skin and the bag.  Leave the ice on for 15-20 minutes, 03-04 times a day.  Only take over-the-counter or prescription medicines for pain, discomfort, or fever as directed  by your caregiver. Your caregiver may recommend avoiding anti-inflammatory medicines (aspirin, ibuprofen, and naproxen) for 48 hours because these medicines may increase bruising.  Rest the injured area.  If possible, elevate the injured area to reduce swelling. SEEK IMMEDIATE MEDICAL CARE IF:   You have increased bruising or swelling.  You have pain that is getting worse.  Your swelling or pain is not relieved with medicines. MAKE SURE YOU:   Understand these instructions.  Will watch your condition.  Will get help right away if you are not doing well or get worse. Document Released: 08/16/2005 Document Revised: 01/29/2012 Document Reviewed: 09/11/2011 [facility] Patient Information 2014 [place], [place].

## 2013-11-23 NOTE — ED Notes (Signed)
Pt states she was getting ready in bathroom when she slipped and hit head on vent. Pt denies LOC. Pt states she is on coumadin. Pt has bruise to R forehead. Denies any other injury. Pt states she feels drowsy. Pt alert and oriented.

## 2013-11-23 NOTE — ED Notes (Signed)
Patient will call when she has to urinate 

## 2013-11-23 NOTE — ED Provider Notes (Addendum)
CSN: [id number]     Arrival date & time 11/23/13  [id number] History   First MD Initiated Contact with Patient 11/23/13 1016     Chief Complaint  Patient presents with  . Fall   (Consider location/radiation/quality/duration/timing/severity/associated sxs/prior Treatment) HPI Comments: Andrea [name] is a 79 y.o. female who was in her bathroom today, when she accidentally slipped on clothing, and fell to the floor, striking her head. She did not lose consciousness. She presents for evaluation of a headache. She is on Coumadin, and had her last INR check 2 days ago. No recent illnesses. No preceding dizziness. She's using her usual medications. There are no other known modifying factors.   Patient is a 79 y.o. female presenting with fall. The history is provided by the patient.  Fall    Past Medical History  Diagnosis Date  . Hypotension   . Depression with anxiety   . Cough     Wert-onset 08/2009, as of 2014- resolved   . Deep venous thrombosis     post op, rec'd/needed  blood thinner   . Cervicalgia   . Hypothyroidism   . Blood transfusion   . Constipation   . Shortness of breath   . Anxiety   . Dysrhythmia   . Anemia   . Depression   . Lumbar radiculopathy 09/26/2011  . Headache(784.0) 09/27/2011  . Pneumonia     never been in hosp. for pneumonia   . Sleep apnea     doesn't use CPAP any longer, since weight loss  . Arthritis     hands, spine   . History of stress test     15-20 yrs.ago   . Falls frequently     pt. reports that she was falling 3 times a day, has had PT at a facility for a while & now is getting home PT 2-3 times/ week   . Right hand fracture     Monday   Past Surgical History  Procedure Laterality Date  . Replacement total knee bilateral    . Appendectomy    . Breast excisional biopsy      breast bxs on left x 2, breast bx of right x 1-all benign  . Laparoscopic ovarian cystectomy    . Joint replacement  [id number]    bilateral  . Tonsillectomy    . Eye  surgery      cataracts bilateral /w IOL  . Hernia repair      umbilical hernia- [id number]  . Back surgery      2000  . Anterior cervical decomp/discectomy fusion N/A 07/09/2013    Procedure: ANTERIOR CERVICAL DECOMPRESSION/DISCECTOMY FUSION 3 LEVELS;  Surgeon: Sinclair Ship, MD;  Location: [facility];  Service: Orthopedics;  Laterality: N/A;  Anterior cervical decompression fusion, cervical 3-4, cervical 4-5-, cervical 5-6 with instrumentation, allograft.   Family History  Problem Relation Age of Onset  . Heart disease Father   . Malignant hyperthermia Father   . Deep vein thrombosis Son    History  Substance Use Topics  . Smoking status: Former Smoker -- 0.50 packs/day for 20 years    Types: Cigarettes    Quit date: 11/20/1968  . Smokeless tobacco: Never Used     Comment: smoked for approx. 10 years  . Alcohol Use: 0.6 oz/week    1 Glasses of wine per week     Comment: rare   OB History   Grav Para Term Preterm Abortions TAB SAB Ect Mult Living  Review of Systems  All other systems reviewed and are negative.    Allergies  Clindamycin hcl  Home Medications   Current Outpatient Rx  Name  Route  Sig  Dispense  Refill  . albuterol (PROVENTIL HFA;VENTOLIN HFA) 108 (90 BASE) MCG/ACT inhaler   Inhalation   Inhale 2 puffs into the lungs every 6 (six) hours as needed for wheezing or shortness of breath.   1 Inhaler   2   . buPROPion (WELLBUTRIN XL) 300 MG 24 hr tablet   Oral   Take 300 mg by mouth every morning.          . calcium carbonate (TUMS EX) 750 MG chewable tablet   Oral   Chew 1 tablet by mouth daily.         . Cholecalciferol (VITAMIN D3) 5000 UNITS CAPS   Oral   Take 1 capsule by mouth daily.         . clonazePAM (KLONOPIN) 0.5 MG tablet   Oral   Take 0.5 mg by mouth 3 (three) times daily as needed for anxiety.         . diazepam (VALIUM) 10 MG tablet   Oral   Take 10 mg by mouth every 6 (six) hours as needed for anxiety.          . DULoxetine (CYMBALTA) 60 MG capsule   Oral   Take 120 mg by mouth daily before breakfast.          . levothyroxine (SYNTHROID, LEVOTHROID) 50 MCG tablet   Oral   Take 50 mcg by mouth every morning.          . meloxicam (MOBIC) 7.5 MG tablet   Oral   Take 1 tablet (7.5 mg total) by mouth daily.   30 tablet   2   . oxyCODONE-acetaminophen (PERCOCET) 5-325 MG per tablet   Oral   Take 1 tablet by mouth every 4 (four) hours as needed. pain         . temazepam (RESTORIL) 30 MG capsule   Oral   Take 1 capsule (30 mg total) by mouth at bedtime.   7 capsule   0   . warfarin (COUMADIN) 5 MG tablet   Oral   Take 5 mg by mouth daily at 6 PM.          BP 147/76  Pulse 67  Temp(Src) 98.5 F (36.9 C) (Oral)  Resp 20  SpO2 98% Physical Exam  Nursing note and vitals reviewed. Constitutional: She is oriented to person, place, and time. She appears well-developed.  Elderly, frail  HENT:  Head: Normocephalic.  Contusion right forehead with minor abrasion no laceration.  Eyes: Conjunctivae and EOM are normal. Pupils are equal, round, and reactive to light.  Neck: Normal range of motion and phonation normal. Neck supple.  Cardiovascular: Normal rate, regular rhythm and intact distal pulses.   Pulmonary/Chest: Effort normal and breath sounds normal. She exhibits no tenderness.  Abdominal: Soft. She exhibits no distension. There is no tenderness. There is no guarding.  Musculoskeletal: Normal range of motion. She exhibits no edema and no tenderness.  No tenderness of the cervical spine  Neurological: She is alert and oriented to person, place, and time. She exhibits normal muscle tone.  Skin: Skin is warm and dry.  Psychiatric: She has a normal mood and affect. Her behavior is normal. Judgment and thought content normal.    ED Course  Procedures (including critical care time) Medications  oxyCODONE-acetaminophen (PERCOCET/ROXICET)  5-325 MG per tablet 1 tablet (1  tablet Oral Given 11/23/13 1049)    12:04 PM Reevaluation with update and discussion. After initial assessment and treatment, an updated evaluation reveals no further c/o. PE unchanged. Findings were discussed with the patient and her son, who is with her. All questions answered. [place] - Abnormal; Notable for the following:    Prothrombin Time 20.4 (*)    INR 1.80 (*)    All other components within normal limits  BASIC METABOLIC PANEL - Abnormal; Notable for the following:    Glucose, Bld 112 (*)    GFR calc non Af Amer 79 (*)    All other components within normal limits  CBC  URINALYSIS, ROUTINE W REFLEX MICROSCOPIC   Imaging Review Ct Head Wo Contrast  11/23/2013   CLINICAL DATA:  History of trauma from a fall. Patient is on Coumadin.  EXAM: CT HEAD WITHOUT CONTRAST  TECHNIQUE: Contiguous axial images were obtained from the base of the skull through the vertex without intravenous contrast.  COMPARISON:  Head CT 02/22/2013.  FINDINGS: Small amount of soft tissue thickening in the right frontal scalp, compatible with a small scalp contusion/ hematoma. Mild cerebral atrophy. Patchy and confluent areas of decreased attenuation are noted throughout the deep and periventricular white matter of the cerebral hemispheres bilaterally, compatible with chronic microvascular ischemic disease. No acute displaced skull fractures are identified. No acute intracranial abnormality. Specifically, no evidence of acute post-traumatic intracranial hemorrhage, no definite regions of acute/subacute cerebral ischemia, no focal mass, mass effect, hydrocephalus or abnormal intra or extra-axial fluid collections. The visualized paranasal sinuses and mastoids are well pneumatized.  IMPRESSION: 1. Small right frontal scalp hematoma/contusion, without evidence of underlying acute displaced skull fracture or acute intracranial abnormality. 2. Mild cerebral atrophy with extensive  chronic microvascular ischemic changes in the cerebral white matter.   Electronically Signed   By: Vinnie Langton M.D.   On: 11/23/2013 11:28    EKG Interpretation   None       MDM   1. Head injury, acute, initial encounter   2. Head contusion, initial encounter     Mechanical fall with head injury and contusion, but no intracranial leading. She is anticoagulated with INR 1.8. Is no indication for changing her anticoagulation, further observation or evaluation, at this time  The patient appears reasonably screened and/or stabilized for discharge and I doubt any other medical condition or other [facility] requiring further screening, evaluation, or treatment in the ED at this time prior to discharge.  Nursing Notes Reviewed/ Care Coordinated, and agree without changes. Applicable Imaging Reviewed.  Interpretation of Laboratory Data incorporated into ED treatment   Plan: Home Medications- usual; Home Treatments and Observation- rest, cryotherapy; return here if the recommended treatment, does not improve the symptoms; Recommended follow up- PCP or here when necessary    Richarda Blade, MD 11/23/13 [place], MD 11/23/13 [phone]

## 2013-11-26 ENCOUNTER — Ambulatory Visit: Payer: Medicare Other | Admitting: Family Medicine

## 2013-12-04 DIAGNOSIS — F325 Major depressive disorder, single episode, in full remission: Secondary | ICD-10-CM | POA: Diagnosis not present

## 2013-12-12 ENCOUNTER — Ambulatory Visit (INDEPENDENT_AMBULATORY_CARE_PROVIDER_SITE_OTHER): Payer: Medicare Other | Admitting: General Practice

## 2013-12-12 DIAGNOSIS — I82409 Acute embolism and thrombosis of unspecified deep veins of unspecified lower extremity: Secondary | ICD-10-CM | POA: Diagnosis not present

## 2013-12-12 DIAGNOSIS — Z7901 Long term (current) use of anticoagulants: Secondary | ICD-10-CM | POA: Diagnosis not present

## 2013-12-12 DIAGNOSIS — Z5181 Encounter for therapeutic drug level monitoring: Secondary | ICD-10-CM | POA: Diagnosis not present

## 2013-12-12 LAB — POCT INR: INR: 3.4

## 2013-12-12 NOTE — Progress Notes (Signed)
Pre-visit discussion using our clinic review tool. No additional management support is needed unless otherwise documented below in the visit note.  

## 2014-01-05 DIAGNOSIS — M545 Low back pain, unspecified: Secondary | ICD-10-CM | POA: Diagnosis not present

## 2014-01-07 DIAGNOSIS — M545 Low back pain, unspecified: Secondary | ICD-10-CM | POA: Diagnosis not present

## 2014-01-07 DIAGNOSIS — R269 Unspecified abnormalities of gait and mobility: Secondary | ICD-10-CM | POA: Diagnosis not present

## 2014-01-08 DIAGNOSIS — F325 Major depressive disorder, single episode, in full remission: Secondary | ICD-10-CM | POA: Diagnosis not present

## 2014-01-09 ENCOUNTER — Ambulatory Visit (INDEPENDENT_AMBULATORY_CARE_PROVIDER_SITE_OTHER): Payer: Medicare Other | Admitting: General Practice

## 2014-01-09 DIAGNOSIS — R269 Unspecified abnormalities of gait and mobility: Secondary | ICD-10-CM | POA: Diagnosis not present

## 2014-01-09 DIAGNOSIS — M545 Low back pain, unspecified: Secondary | ICD-10-CM | POA: Diagnosis not present

## 2014-01-09 DIAGNOSIS — Z7901 Long term (current) use of anticoagulants: Secondary | ICD-10-CM | POA: Diagnosis not present

## 2014-01-09 DIAGNOSIS — I82409 Acute embolism and thrombosis of unspecified deep veins of unspecified lower extremity: Secondary | ICD-10-CM

## 2014-01-09 LAB — POCT INR: INR: 3.5

## 2014-01-09 NOTE — Progress Notes (Signed)
Pre visit review using our clinic review tool, if applicable. No additional management support is needed unless otherwise documented below in the visit note. 

## 2014-01-19 DIAGNOSIS — M545 Low back pain, unspecified: Secondary | ICD-10-CM | POA: Diagnosis not present

## 2014-01-19 DIAGNOSIS — R269 Unspecified abnormalities of gait and mobility: Secondary | ICD-10-CM | POA: Diagnosis not present

## 2014-01-22 DIAGNOSIS — R269 Unspecified abnormalities of gait and mobility: Secondary | ICD-10-CM | POA: Diagnosis not present

## 2014-01-22 DIAGNOSIS — M545 Low back pain, unspecified: Secondary | ICD-10-CM | POA: Diagnosis not present

## 2014-01-26 DIAGNOSIS — M545 Low back pain, unspecified: Secondary | ICD-10-CM | POA: Diagnosis not present

## 2014-01-26 DIAGNOSIS — R269 Unspecified abnormalities of gait and mobility: Secondary | ICD-10-CM | POA: Diagnosis not present

## 2014-01-29 DIAGNOSIS — R269 Unspecified abnormalities of gait and mobility: Secondary | ICD-10-CM | POA: Diagnosis not present

## 2014-01-29 DIAGNOSIS — M545 Low back pain, unspecified: Secondary | ICD-10-CM | POA: Diagnosis not present

## 2014-01-30 ENCOUNTER — Ambulatory Visit (INDEPENDENT_AMBULATORY_CARE_PROVIDER_SITE_OTHER): Payer: Medicare Other | Admitting: General Practice

## 2014-01-30 DIAGNOSIS — Z5181 Encounter for therapeutic drug level monitoring: Secondary | ICD-10-CM

## 2014-01-30 DIAGNOSIS — Z7901 Long term (current) use of anticoagulants: Secondary | ICD-10-CM

## 2014-01-30 DIAGNOSIS — I82409 Acute embolism and thrombosis of unspecified deep veins of unspecified lower extremity: Secondary | ICD-10-CM

## 2014-01-30 LAB — POCT INR: INR: 2.6

## 2014-01-30 NOTE — Progress Notes (Signed)
Pre visit review using our clinic review tool, if applicable. No additional management support is needed unless otherwise documented below in the visit note. 

## 2014-02-05 DIAGNOSIS — M545 Low back pain, unspecified: Secondary | ICD-10-CM | POA: Diagnosis not present

## 2014-02-05 DIAGNOSIS — R269 Unspecified abnormalities of gait and mobility: Secondary | ICD-10-CM | POA: Diagnosis not present

## 2014-02-12 ENCOUNTER — Ambulatory Visit (INDEPENDENT_AMBULATORY_CARE_PROVIDER_SITE_OTHER): Payer: Medicare Other | Admitting: Internal Medicine

## 2014-02-12 ENCOUNTER — Encounter: Payer: Self-pay | Admitting: Internal Medicine

## 2014-02-12 VITALS — BP 142/80 | HR 59 | Temp 98.3°F | Resp 16 | Ht 64.75 in | Wt 234.8 lb

## 2014-02-12 DIAGNOSIS — I82403 Acute embolism and thrombosis of unspecified deep veins of lower extremity, bilateral: Secondary | ICD-10-CM

## 2014-02-12 DIAGNOSIS — F5105 Insomnia due to other mental disorder: Secondary | ICD-10-CM

## 2014-02-12 DIAGNOSIS — M129 Arthropathy, unspecified: Secondary | ICD-10-CM | POA: Diagnosis not present

## 2014-02-12 DIAGNOSIS — E039 Hypothyroidism, unspecified: Secondary | ICD-10-CM

## 2014-02-12 DIAGNOSIS — M1990 Unspecified osteoarthritis, unspecified site: Secondary | ICD-10-CM

## 2014-02-12 DIAGNOSIS — H04123 Dry eye syndrome of bilateral lacrimal glands: Secondary | ICD-10-CM

## 2014-02-12 DIAGNOSIS — E785 Hyperlipidemia, unspecified: Secondary | ICD-10-CM

## 2014-02-12 DIAGNOSIS — R7309 Other abnormal glucose: Secondary | ICD-10-CM | POA: Diagnosis not present

## 2014-02-12 DIAGNOSIS — F489 Nonpsychotic mental disorder, unspecified: Secondary | ICD-10-CM

## 2014-02-12 DIAGNOSIS — Z7901 Long term (current) use of anticoagulants: Secondary | ICD-10-CM | POA: Diagnosis not present

## 2014-02-12 DIAGNOSIS — I82409 Acute embolism and thrombosis of unspecified deep veins of unspecified lower extremity: Secondary | ICD-10-CM | POA: Diagnosis not present

## 2014-02-12 DIAGNOSIS — F341 Dysthymic disorder: Secondary | ICD-10-CM

## 2014-02-12 DIAGNOSIS — IMO0002 Reserved for concepts with insufficient information to code with codable children: Secondary | ICD-10-CM

## 2014-02-12 DIAGNOSIS — M5416 Radiculopathy, lumbar region: Secondary | ICD-10-CM

## 2014-02-12 DIAGNOSIS — F409 Phobic anxiety disorder, unspecified: Secondary | ICD-10-CM

## 2014-02-12 DIAGNOSIS — H04129 Dry eye syndrome of unspecified lacrimal gland: Secondary | ICD-10-CM | POA: Insufficient documentation

## 2014-02-12 MED ORDER — WARFARIN SODIUM 5 MG PO TABS: 5.0000 mg | ORAL_TABLET | Freq: Every day | ORAL | Status: DC

## 2014-02-12 MED ORDER — TEMAZEPAM 22.5 MG PO CAPS
22.5000 mg | ORAL_CAPSULE | Freq: Every day | ORAL | Status: DC
Start: 2014-02-12 — End: 2014-03-05

## 2014-02-12 MED ORDER — OXYCODONE-ACETAMINOPHEN 10-325 MG PO TABS: ORAL_TABLET | ORAL | Status: DC

## 2014-02-12 MED ORDER — TEMAZEPAM 7.5 MG PO CAPS: 7.5000 mg | ORAL_CAPSULE | Freq: Every day | ORAL | Status: DC

## 2014-02-12 MED ORDER — TEMAZEPAM 15 MG PO CAPS: 15.0000 mg | ORAL_CAPSULE | Freq: Every day | ORAL | Status: DC

## 2014-02-12 NOTE — Patient Instructions (Signed)
Begin your restoril taper in 2 weeks (when you return from your trip)

## 2014-02-12 NOTE — Progress Notes (Signed)
Patient ID: Andrea [name], female   DOB: 22-Mar-1935, 79 y.o.   MRN: [id number]   Location:  [facility]  Code Status: DNR, has living will  Allergies  Allergen Reactions  . Clindamycin Hcl Other (See Comments)    REACTION: swelling, pt. Reports that she had a 16 lb. weightgain in one day    Chief Complaint  Patient presents with  . Establish Care    NP-Establish Care  . other    finger tips go from blue/white/red off/on, not sleeping good, anxiety issues with filling out paperwork today  . other    ? can she continue taking Meloxicam while on Warfarin, if so needs refills    HPI: Patient is a 79 y.o.  seen in the office today to establish with the practice.    Has white discoloration of fingers and they get very cold, sometimes blue with cold.  Also seems to occur with anxiety.  Coming here, they turned white.  Also turn red and purple.  Was getting PT for bad arthritis, and had hot back put on her back--afterwards, hands white all the way up through fingers.  Is painful.  Feel numb and hurt at the same time.    Teeth have deteriorated.  Has very dry skin.  Needs to use a lot of lotion.  Has dry eyes, as well.  Right eye waters and left stays dry.    Arthritis is her biggest problem. Used to take percocet bid for pain.  Has been down to one pill per week of percocet.  Had been using naproxen otc, but cannot take it with coumadin. Takes the meloxicam/mobic now.    Last Nov (11/14) had third blood clot in leg diagnosed in urgent care and on coumadin since.  Cannot take asa.  Dr. Melvyn Novas saw her and he did some testing to r/o PE.  [facility] has been monitoring her coumadin since.  Wants to be able to get all meds in one place.    3 years ago, started to fall a lot--always fell backwards and hit her head.  Got to where she almost fell everyday.  Had a few concussions.  Had funny hallucinations at the time--little chinamen dressed in white silk  and hats.   Finally saw Dr. Jonni Sanger who noted lumbar stenosis and severe arthritis throughout the spine.  Also has cervical spinal stenosis--had surgery.  Had only one fall since then when tripped over some clothes.  Neck pain improved, but back and hands remain painful.  Has two artificial knees.    Hands now hurt a lot, also.    Wears life alert bracelet on left wrist.    Depression has been better recently.  Has had problems since 79 yo.  Has been on medication for mood for 52 years.  1/1, anxiety and depression and pain so bad--threw out the pills.  Feels her depression is better, but anxiety worse.  Was taking wellbutrin and cymbalta.  She does think the cymbalta helped her pain, but also took percocet then.  Does not sleep well.  Has taken restoril a long time.  Also takes two benadryl with it.  Neither helping.  Moved here 5 years ago.  [place]  Has been going to [facility].  Has not seen a psychiatrist in a few mos.    Has a trip upcoming to [place] where she was born and raised.  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  Is going to have coffee with her old Medical laboratory scientific officer.  Going to be at her old church and see over 50 people.  Uses rollator with seat when she goes out.    Says her daughter is a worry wort.  Pt is here with [name] from [employer].    Gets easily short of breath walking kitchen to bedroom.  Pt feels it is due to weight/deconditioning.  Has albuterol inhaler that she has used a few times.  Does not have known osteoporosis.  Screening heel scan was ok.  May have had a bone density.  Uses clonazepam about 2x per week if very anxious.    Has to hurry up to get to the bathroom.  Sometimes does not make it in time.  Has a long way to go through the house and has 3 steps to go up.  Wears depends sometimes, but can usually use a pad.    Does take restoril faithfully each night with the 2 benadryl.  Has been  over a year since she had a physical.  Takes lasix once a month if her ankle starts to hang over her shoe.  Review of Systems:  Review of Systems  Constitutional: Positive for malaise/fatigue. Negative for fever.  HENT: Positive for hearing loss. Negative for congestion.   Eyes: Negative for blurred vision.       Glasses  Respiratory: Negative for cough and shortness of breath.   Cardiovascular: Positive for leg swelling. Negative for chest pain and palpitations.  Gastrointestinal: Positive for constipation. Negative for nausea, vomiting, abdominal pain, diarrhea, blood in stool and melena.  Genitourinary: Positive for urgency and frequency. Negative for dysuria and hematuria.       Incontinence  Musculoskeletal: Positive for back pain, falls, joint pain and myalgias.  Skin: Negative for rash.  Neurological: Positive for dizziness and weakness. Negative for loss of consciousness.  Endo/Heme/Allergies: Bruises/bleeds easily.  Psychiatric/Behavioral: Positive for depression and memory loss. The patient is nervous/anxious and has insomnia.     Past Medical History  Diagnosis Date  . Hypotension   . Depression with anxiety   . Cough     Wert-onset 08/2009, as of 2014- resolved   . Deep venous thrombosis     post op, rec'd/needed  blood thinner   . Cervicalgia   . Hypothyroidism   . Blood transfusion   . Constipation   . Shortness of breath   . Anxiety   . Dysrhythmia   . Anemia   . Depression   . Lumbar radiculopathy 09/26/2011  . Headache(784.0) 09/27/2011  . Pneumonia     never been in hosp. for pneumonia   . Sleep apnea     doesn't use CPAP any longer, since weight loss  . Arthritis     hands, spine   . History of stress test     15-20 yrs.ago   . Falls frequently     pt. reports that she was falling 3 times a day, has had PT at a facility for a while & now is getting home PT 2-3 times/ week   . Right hand fracture     Monday    Past Surgical History  Procedure  Laterality Date  . Replacement total knee bilateral    . Appendectomy    . Breast excisional biopsy      breast bxs on left x 2, breast bx of right x 1-all benign  . Laparoscopic ovarian cystectomy    .  Joint replacement  [id number]    bilateral  . Tonsillectomy    . Eye surgery      cataracts bilateral /w IOL  . Hernia repair      umbilical hernia- [id number]  . Back surgery      2000  . Anterior cervical decomp/discectomy fusion N/A 07/09/2013    Procedure: ANTERIOR CERVICAL DECOMPRESSION/DISCECTOMY FUSION 3 LEVELS;  Surgeon: Sinclair Ship, MD;  Location: [place];  Service: Orthopedics;  Laterality: N/A;  Anterior cervical decompression fusion, cervical 3-4, cervical 4-5-, cervical 5-6 with instrumentation, allograft.    Social History:   reports that she quit smoking about 45 years ago. Her smoking use included Cigarettes. She has a 10 pack-year smoking history. She has never used smokeless tobacco. She reports that she drinks about 0.6 ounces of alcohol per week. She reports that she does not use illicit drugs.  Family History  Problem Relation Age of Onset  . Heart disease Father   . Malignant hyperthermia Father   . Arthritis Father   . Deep vein thrombosis Son   . Diabetes Son   . Obesity Son   . Depression Son   . Hypertension Son   . Parkinson's disease Mother   . Diabetes Daughter   . Hypertension Daughter   . Anxiety disorder Daughter   . Cancer Maternal Aunt   . Hyperlipidemia Son   . Hypertension Son     Medications: Patient's Medications  New Prescriptions   No medications on file  Previous Medications   ALBUTEROL (PROVENTIL HFA;VENTOLIN HFA) 108 (90 BASE) MCG/ACT INHALER    Inhale 2 puffs into the lungs every 6 (six) hours as needed for wheezing or shortness of breath.   CALCIUM CARBONATE (TUMS EX) 750 MG CHEWABLE TABLET    Chew 1 tablet by mouth daily.   CLONAZEPAM (KLONOPIN) 0.5 MG TABLET    Take 0.5 mg by mouth as needed for anxiety.    DIPHENHYDRAMINE  (BENADRYL ALLERGY) 25 MG TABLET    Take 25 mg by mouth as needed.   LEVOTHYROXINE (SYNTHROID, LEVOTHROID) 50 MCG TABLET    Take 50 mcg by mouth every morning.    MELOXICAM (MOBIC) 7.5 MG TABLET    Take 1 tablet (7.5 mg total) by mouth daily.   MULTIPLE VITAMINS-MINERALS (MULTIVITAMIN PO)    Take 1 tablet by mouth.   OXYBUTYNIN (OXYTROL) 3.9 MG/24HR    Place 1 patch onto the skin as needed. For overactive bladder   OXYCODONE-ACETAMINOPHEN (PERCOCET) 10-325 MG PER TABLET    Take 1-2 tablets by mouth every 4-6 hours as needed for Arthritis pain   TEMAZEPAM (RESTORIL) 30 MG CAPSULE    Take 1 capsule (30 mg total) by mouth at bedtime.   WARFARIN (COUMADIN) 5 MG TABLET    Take 5 mg by mouth daily at 6 PM.  Modified Medications   No medications on file  Discontinued Medications   BUPROPION (WELLBUTRIN XL) 300 MG 24 HR TABLET    Take 300 mg by mouth every morning.    CHOLECALCIFEROL (VITAMIN D3) 5000 UNITS CAPS    Take 1 capsule by mouth daily.   DIAZEPAM (VALIUM) 10 MG TABLET    Take 10 mg by mouth every 6 (six) hours as needed for anxiety.   DULOXETINE (CYMBALTA) 60 MG CAPSULE    Take 120 mg by mouth daily before breakfast.    OXYCODONE-ACETAMINOPHEN (PERCOCET) 5-325 MG PER TABLET    Take 1 tablet by mouth every 4 (four) hours as needed. pain  Physical Exam: Filed Vitals:   02/12/14 0914  BP: 142/80  Pulse: 59  Temp: 98.3 F (36.8 C)  TempSrc: Oral  Resp: 16  Height: 5' 4.75" (1.645 m)  Weight: 234 lb 12.8 oz (106.505 kg)  SpO2: 99%  Physical Exam  Constitutional: She is oriented to person, place, and time. She appears well-developed and well-nourished. No distress.  HENT:  Head: Normocephalic and atraumatic.  Right Ear: External ear normal.  Left Ear: External ear normal.  Nose: Nose normal.  Mouth/Throat: Oropharynx is clear and moist.  Eyes: Conjunctivae and EOM are normal. Pupils are equal, round, and reactive to light.  Neck: Normal range of motion. Neck supple. No JVD  present.  Cardiovascular: Normal rate, regular rhythm, normal heart sounds and intact distal pulses.   Pulmonary/Chest: Effort normal and breath sounds normal. No respiratory distress.  Abdominal: Soft. Bowel sounds are normal. She exhibits no distension and no mass. There is no tenderness.  Musculoskeletal: Normal range of motion.  Walks with rollator walker  Neurological: She is alert and oriented to person, place, and time.  Skin: Skin is warm and dry.  Psychiatric:  Tearful at times during visit, lists one concern after another without taking a breath    Labs reviewed: Basic Metabolic Panel:  Recent Labs  04/24/13 1230 07/03/13 0918 10/17/13 0936 10/20/13 0859 11/23/13 1057  NA 139 141 139 141 138  K 3.8 4.5 4.3 4.7 4.0  CL 103 105 104 101 101  CO2 25 26  --   --  26  GLUCOSE 84 102* 109* 102* 112*  BUN 12 24* 35* 19 23  CREATININE 0.83 0.88 1.00 1.10 0.79  CALCIUM 9.6 9.1  --   --  8.6   Liver Function Tests:  Recent Labs  02/22/13 1245 07/03/13 0918  AST 31 16  ALT 14 9  ALKPHOS 110 114  BILITOT 0.5 0.3  PROT 6.9 6.4  ALBUMIN 3.5 3.6   Recent Labs  02/22/13 1245  AMMONIA 12   CBC:  Recent Labs  02/22/13 1630 04/24/13 1119 07/03/13 0918 10/17/13 0936 10/20/13 0859 11/23/13 1057  WBC 7.6 8.9 6.8  --   --  6.7  NEUTROABS 5.1 6.8 4.4  --   --   --   HGB 12.9 12.0 12.9 13.9 13.9 12.4  HCT 38.6 35.6* 38.3 41.0 41.0 37.5  MCV 85.4 86.4 86.3  --   --  83.7  PLT 209 216 236  --   --  251    Lab Results  Component Value Date   HGBA1C 5.4 09/25/2011    Assessment/Plan 1. DVT (deep venous thrombosis) -on longstanding coumadin therapy, will need to follow with Tivis Ringer for this - Protime-INR - CBC With differential/Platelet  2. Hypothyroidism Cont synthroid--reviewed proper time to take medication, f/u tsh - TSH  3. Lumbar radiculopathy - cont percocet for pain and will need to set up narcotic contract with pt - CBC With  differential/Platelet - Sedimentation rate  4. DEPRESSION/ANXIETY -longstanding, ?bipolar -currently on wellbutrin, klonopin, valium, cymbalta, and restoril which can certainly explain her gait unsteadiness, confusion   5. Dyslipidemia -encouraged low fat diet and exercise - Comprehensive metabolic panel - Hemoglobin A1c - Lipid panel  6. Encounter for long-term (current) use of anticoagulants -pt requests that we monitor her coumadin here--will need to set her up with [name] after this initial check - Protime-INR  7. Obesity, morbid - unable to exercise due to chronic pain and fatigue; best diet encouraged with  proper portion sizes  - CBC With differential/Platelet - Hemoglobin A1c - Lipid panel  8. Chronically dry eyes - r/o sjogren's - CBC With differential/Platelet - Sjogren's syndrome antibods(ssa + ssb) - Sedimentation rate  9. Insomnia due to anxiety and fear -advised that she would do better without sedative hypnotics and benadryl due to their adverse effects in her age group -recommended melatonin 3mg  an hour before bedtime if she must take something  10. Arthritis -unclear if this is all OA/degenerative or if she has a rheumatologic disorder--seems fibromyalgia most likely - Sedimentation rate Labs/tests ordered: Orders Placed This Encounter  Procedures  . HM MAMMOGRAPHY    This external order was created through the Results Console.  [name] HM PAP SMEAR    This external order was created through the Results Console.  . Protime-INR  . CBC With differential/Platelet  . Comprehensive metabolic panel  . Hemoglobin A1c  . Lipid panel  . TSH  . Sjogren's syndrome antibods(ssa + ssb)  . Sedimentation rate  . HM COLONOSCOPY    This external order was created through the Results Console.  [name] HM COLONOSCOPY    This external order was created through the Results Console.    Next appt:  3 wks

## 2014-02-13 ENCOUNTER — Ambulatory Visit (INDEPENDENT_AMBULATORY_CARE_PROVIDER_SITE_OTHER): Payer: Medicare Other | Admitting: General Practice

## 2014-02-13 LAB — SEDIMENTATION RATE: Sed Rate: 16 mm/hr (ref 0–40)

## 2014-02-13 LAB — COMPREHENSIVE METABOLIC PANEL
ALT: 15 IU/L (ref 0–32)
AST: 23 IU/L (ref 0–40)
Albumin/Globulin Ratio: 1.8 (ref 1.1–2.5)
Albumin: 4.1 g/dL (ref 3.5–4.8)
Alkaline Phosphatase: 85 IU/L (ref 39–117)
BUN/Creatinine Ratio: 31 — ABNORMAL HIGH (ref 11–26)
BUN: 26 mg/dL (ref 8–27)
CO2: 24 mmol/L (ref 18–29)
Calcium: 8.9 mg/dL (ref 8.7–10.3)
Chloride: 102 mmol/L (ref 97–108)
Creatinine, Ser: 0.84 mg/dL (ref 0.57–1.00)
GFR calc Af Amer: 78 mL/min/{1.73_m2} (ref >59)
GFR calc non Af Amer: 68 mL/min/{1.73_m2} (ref >59)
Globulin, Total: 2.3 g/dL (ref 1.5–4.5)
Glucose: 86 mg/dL (ref 65–99)
Potassium: 4.4 mmol/L (ref 3.5–5.2)
Sodium: 142 mmol/L (ref 134–144)
Total Bilirubin: <0.2 mg/dL (ref 0.0–1.2)
Total Protein: 6.4 g/dL (ref 6.0–8.5)

## 2014-02-13 LAB — TSH: TSH: 1.98 u[IU]/mL (ref 0.450–4.500)

## 2014-02-13 LAB — LIPID PANEL
Chol/HDL Ratio: 4.1 ratio units (ref 0.0–4.4)
Cholesterol, Total: 199 mg/dL (ref 100–199)
HDL: 49 mg/dL (ref >39)
LDL Calculated: 124 mg/dL — ABNORMAL HIGH (ref 0–99)
Triglycerides: 131 mg/dL (ref 0–149)
VLDL Cholesterol Cal: 26 mg/dL (ref 5–40)

## 2014-02-13 LAB — CBC WITH DIFFERENTIAL
Basophils Absolute: 0 10*3/uL (ref 0.0–0.2)
Basos: 1 %
Eos: 4 %
Eosinophils Absolute: 0.2 10*3/uL (ref 0.0–0.4)
HCT: 37.4 % (ref 34.0–46.6)
Hemoglobin: 12.4 g/dL (ref 11.1–15.9)
Immature Grans (Abs): 0 10*3/uL (ref 0.0–0.1)
Immature Granulocytes: 0 %
Lymphocytes Absolute: 1.7 10*3/uL (ref 0.7–3.1)
Lymphs: 31 %
MCH: 26.8 pg (ref 26.6–33.0)
MCHC: 33.2 g/dL (ref 31.5–35.7)
MCV: 81 fL (ref 79–97)
Monocytes Absolute: 0.4 10*3/uL (ref 0.1–0.9)
Monocytes: 7 %
Neutrophils Absolute: 3.2 10*3/uL (ref 1.4–7.0)
Neutrophils Relative %: 57 %
Platelets: 304 10*3/uL (ref 150–379)
RBC: 4.63 x10E6/uL (ref 3.77–5.28)
RDW: 15 % (ref 12.3–15.4)
WBC: 5.6 10*3/uL (ref 3.4–10.8)

## 2014-02-13 LAB — HEMOGLOBIN A1C
Est. average glucose Bld gHb Est-mCnc: 126 mg/dL
Hgb A1c MFr Bld: 6 % — ABNORMAL HIGH (ref 4.8–5.6)

## 2014-02-13 LAB — PROTIME-INR
INR: 3.8 — ABNORMAL HIGH (ref 0.8–1.2)
Prothrombin Time: 40.2 s — ABNORMAL HIGH (ref 9.1–12.0)

## 2014-02-13 LAB — SJOGREN'S SYNDROME ANTIBODS(SSA + SSB)
ENA SSA (RO) Ab: <0.2 AI (ref 0.0–0.9)
ENA SSB (LA) Ab: <0.2 AI (ref 0.0–0.9)

## 2014-02-16 DIAGNOSIS — M545 Low back pain, unspecified: Secondary | ICD-10-CM | POA: Diagnosis not present

## 2014-02-16 DIAGNOSIS — M542 Cervicalgia: Secondary | ICD-10-CM | POA: Diagnosis not present

## 2014-02-27 ENCOUNTER — Other Ambulatory Visit (INDEPENDENT_AMBULATORY_CARE_PROVIDER_SITE_OTHER): Payer: Medicare Other

## 2014-02-27 VITALS — HR 72 | Resp 10 | Wt 233.0 lb

## 2014-02-27 DIAGNOSIS — I82409 Acute embolism and thrombosis of unspecified deep veins of unspecified lower extremity: Secondary | ICD-10-CM | POA: Diagnosis not present

## 2014-02-27 LAB — POCT INR: INR: 1.5

## 2014-02-27 NOTE — Patient Instructions (Addendum)
Per Dr.Reed, patient to take 5 mg daily and return on Monday 03/02/14 to see Oretha Ellis for PT/INR office visit

## 2014-03-02 ENCOUNTER — Ambulatory Visit (INDEPENDENT_AMBULATORY_CARE_PROVIDER_SITE_OTHER): Payer: Medicare Other | Admitting: Pharmacotherapy

## 2014-03-02 ENCOUNTER — Encounter: Payer: Self-pay | Admitting: Pharmacotherapy

## 2014-03-02 VITALS — BP 130/82 | HR 71 | Wt 224.0 lb

## 2014-03-02 DIAGNOSIS — I82409 Acute embolism and thrombosis of unspecified deep veins of unspecified lower extremity: Secondary | ICD-10-CM

## 2014-03-02 DIAGNOSIS — Z7901 Long term (current) use of anticoagulants: Secondary | ICD-10-CM | POA: Diagnosis not present

## 2014-03-02 LAB — POCT INR: INR: 1.7

## 2014-03-02 MED ORDER — WARFARIN SODIUM 5 MG PO TABS: ORAL_TABLET | ORAL | Status: DC

## 2014-03-02 NOTE — Progress Notes (Signed)
Subjective:     Patient ID: Andrea [name], female   DOB: Aug 10, 1935, 79 y.o.   MRN: [id number]  HPI INR on 02/27/14 was low at 1.5 Denies missed doses. Denies unusual bleeding or bruising. Denies CP, falls.  Some DOE. Consistent with vitamin K.  Review of Systems  HENT: Negative for nosebleeds.   Respiratory: Positive for shortness of breath.   Cardiovascular: Negative for chest pain.  Gastrointestinal: Negative for blood in stool and anal bleeding.  Genitourinary: Negative for hematuria.       Objective:   Physical Exam  Constitutional: She is oriented to person, place, and time. She appears well-developed and well-nourished.  HENT:  Right Ear: External ear normal.  Left Ear: External ear normal.  Eyes: Pupils are equal, round, and reactive to light.  Cardiovascular: Normal rate, regular rhythm and normal heart sounds.   Pulmonary/Chest: Effort normal and breath sounds normal.  Neurological: She is alert and oriented to person, place, and time.  Uses walker to assist with ambulation  Skin: Skin is warm and dry.  Psychiatric: She has a normal mood and affect. Her behavior is normal.    BP:  130/82  HR 71  Wt:  224lb    Assessment:     INR 1.7     Plan:     1.  INR below goal 2-3 2.  Restart Coumadin at 5mg  QD except 2.5mg  Wednesdays 3.  RTC in 2 weeks.

## 2014-03-02 NOTE — Patient Instructions (Signed)
INR 1.7 Coumadin dose 5mg  (1 tablet) daily except 2.5mg  (1/2 tablet) on Wednesdays

## 2014-03-05 ENCOUNTER — Encounter: Payer: Self-pay | Admitting: Internal Medicine

## 2014-03-05 ENCOUNTER — Ambulatory Visit (INDEPENDENT_AMBULATORY_CARE_PROVIDER_SITE_OTHER): Payer: Medicare Other | Admitting: Internal Medicine

## 2014-03-05 VITALS — BP 130/72 | HR 60 | Resp 12 | Ht 63.08 in | Wt 223.0 lb

## 2014-03-05 DIAGNOSIS — Z7901 Long term (current) use of anticoagulants: Secondary | ICD-10-CM

## 2014-03-05 DIAGNOSIS — M169 Osteoarthritis of hip, unspecified: Secondary | ICD-10-CM

## 2014-03-05 DIAGNOSIS — I82409 Acute embolism and thrombosis of unspecified deep veins of unspecified lower extremity: Secondary | ICD-10-CM | POA: Diagnosis not present

## 2014-03-05 DIAGNOSIS — R739 Hyperglycemia, unspecified: Secondary | ICD-10-CM | POA: Insufficient documentation

## 2014-03-05 DIAGNOSIS — IMO0002 Reserved for concepts with insufficient information to code with codable children: Secondary | ICD-10-CM | POA: Diagnosis not present

## 2014-03-05 DIAGNOSIS — G4700 Insomnia, unspecified: Secondary | ICD-10-CM | POA: Diagnosis not present

## 2014-03-05 DIAGNOSIS — Z0000 Encounter for general adult medical examination without abnormal findings: Secondary | ICD-10-CM

## 2014-03-05 DIAGNOSIS — R7309 Other abnormal glucose: Secondary | ICD-10-CM

## 2014-03-05 DIAGNOSIS — M1610 Unilateral primary osteoarthritis, unspecified hip: Secondary | ICD-10-CM

## 2014-03-05 DIAGNOSIS — M1612 Unilateral primary osteoarthritis, left hip: Secondary | ICD-10-CM

## 2014-03-05 DIAGNOSIS — M5416 Radiculopathy, lumbar region: Secondary | ICD-10-CM

## 2014-03-05 MED ORDER — SUVOREXANT 10 MG PO TABS: 10.0000 mg | ORAL_TABLET | Freq: Every day | ORAL | Status: DC

## 2014-03-05 NOTE — Progress Notes (Signed)
Patient ID: Andrea [name], female   DOB: 08-05-1935, 79 y.o.   MRN: [id number]   Location:  [facility] / [facility]  Code Status: DNR  Allergies  Allergen Reactions  . Clindamycin Hcl Other (See Comments)    REACTION: swelling, pt. Reports that she had a 16 lb. weightgain in one day    Chief Complaint  Patient presents with  . Annual Exam    Yearly check up, labs completed 02/12/14, EKG-UTD, no pap, MMSE completed today 30/30-  passed clock drawing    . Sleeping Problem    Patient c/o of trouble falling asleep, patient only sleeping about 2 hours a day x last 4 days   . Neck Pain    Right side neck pain- muscle related   . Referral    Bone Density due, please schedule. Patient states last BMD was about 15 years ago     HPI: Patient is a 79 y.o. white female seen in the office today for   Has real problem and cannot handle it anymore.  Tried everything I recommended to help with everything, but is frustrated with not sleeping.      Did lose 12 lbs.    Has not slept for 4 days.  Has not tolerated reduction of temazepam.   Only change in her diet has been to take jello and cheesesticks b/c she couldn't sleep and terrible.  Doesn't know why her coumadin is out of whack.  Blood has been staying thick.  Had not been eating greens before or now.    Has terrible neck pain.  Was so exhausted when she came home--slept to right on the plane on her way home from her trip.    Will try belsomra for her.    Did have a great time at her reunion.    Review of Systems:  Review of Systems  Constitutional: Positive for weight loss and malaise/fatigue. Negative for fever.  HENT: Positive for hearing loss.        Wears hearing aides  Eyes: Negative for blurred vision.       Wears glasses (new last year)  Respiratory: Positive for shortness of breath.   Cardiovascular: Negative for chest pain and leg swelling.  Gastrointestinal: Negative for heartburn,  constipation, blood in stool and melena.  Genitourinary: Negative for dysuria.  Musculoskeletal: Positive for back pain, joint pain and myalgias. Negative for falls.  Skin: Negative for rash.  Neurological: Positive for weakness. Negative for loss of consciousness.  Endo/Heme/Allergies: Bruises/bleeds easily.       On coumadin  Psychiatric/Behavioral: Positive for depression. The patient has insomnia.      Past Medical History  Diagnosis Date  . Hypotension   . Depression with anxiety   . Cough     Wert-onset 08/2009, as of 2014- resolved   . Deep venous thrombosis     post op, rec'd/needed  blood thinner   . Cervicalgia   . Hypothyroidism   . Blood transfusion   . Constipation   . Shortness of breath   . Anxiety   . Dysrhythmia   . Anemia   . Depression   . Lumbar radiculopathy 09/26/2011  . Headache(784.0) 09/27/2011  . Pneumonia     never been in hosp. for pneumonia   . Sleep apnea     doesn't use CPAP any longer, since weight loss  . Arthritis     hands, spine   . History of stress test  15-20 yrs.ago   . Falls frequently     pt. reports that she was falling 3 times a day, has had PT at a facility for a while & now is getting home PT 2-3 times/ week   . Right hand fracture     Monday  . Prediabetes     Past Surgical History  Procedure Laterality Date  . Replacement total knee bilateral    . Appendectomy    . Breast excisional biopsy      breast bxs on left x 2, breast bx of right x 1-all benign  . Laparoscopic ovarian cystectomy    . Joint replacement  [id number]    bilateral  . Tonsillectomy    . Eye surgery      cataracts bilateral /w IOL  . Hernia repair      umbilical hernia- [id number]  . Back surgery      2000  . Anterior cervical decomp/discectomy fusion N/A 07/09/2013    Procedure: ANTERIOR CERVICAL DECOMPRESSION/DISCECTOMY FUSION 3 LEVELS;  Surgeon: Sinclair Ship, MD;  Location: [place];  Service: Orthopedics;  Laterality: N/A;  Anterior  cervical decompression fusion, cervical 3-4, cervical 4-5-, cervical 5-6 with instrumentation, allograft.    Social History:   reports that she quit smoking about 45 years ago. Her smoking use included Cigarettes. She has a 10 pack-year smoking history. She has never used smokeless tobacco. She reports that she drinks about .6 ounces of alcohol per week. She reports that she does not use illicit drugs.  Family History  Problem Relation Age of Onset  . Heart disease Father   . Malignant hyperthermia Father   . Arthritis Father   . Deep vein thrombosis Son   . Diabetes Son   . Obesity Son   . Depression Son   . Hypertension Son   . Parkinson's disease Mother   . Diabetes Daughter   . Hypertension Daughter   . Anxiety disorder Daughter   . Cancer Maternal Aunt   . Hyperlipidemia Son   . Hypertension Son     Medications: Patient's Medications  New Prescriptions   No medications on file  Previous Medications   ALBUTEROL (PROVENTIL HFA;VENTOLIN HFA) 108 (90 BASE) MCG/ACT INHALER    Inhale 2 puffs into the lungs every 6 (six) hours as needed for wheezing or shortness of breath.   CALCIUM CARBONATE (TUMS EX) 750 MG CHEWABLE TABLET    Chew 1 tablet by mouth daily.   CLONAZEPAM (KLONOPIN) 0.5 MG TABLET    Take 0.5 mg by mouth as needed for anxiety.    LEVOTHYROXINE (SYNTHROID, LEVOTHROID) 50 MCG TABLET    Take 50 mcg by mouth every morning.    MELOXICAM (MOBIC) 7.5 MG TABLET    Take 1 tablet (7.5 mg total) by mouth daily.   MULTIPLE VITAMINS-MINERALS (MULTIVITAMIN PO)    Take 1 tablet by mouth.   OXYCODONE-ACETAMINOPHEN (PERCOCET) 10-325 MG PER TABLET    Take 1-2 tablets by mouth every 4-6 hours as needed for Arthritis pain   WARFARIN (COUMADIN) 5 MG TABLET    Take 1 tablet daily except 1/2 tablet on Wednesdays  Modified Medications   No medications on file  Discontinued Medications   OXYBUTYNIN (OXYTROL) 3.9 MG/24HR    Place 1 patch onto the skin as needed. For overactive bladder    TEMAZEPAM (RESTORIL) 15 MG CAPSULE    Take 1 capsule (15 mg total) by mouth at bedtime. For one week for sleep after finish 22.5mg    TEMAZEPAM (  RESTORIL) 22.5 MG CAPSULE    Take 1 capsule (22.5 mg total) by mouth at bedtime. For one week   TEMAZEPAM (RESTORIL) 7.5 MG CAPSULE    Take 1 capsule (7.5 mg total) by mouth at bedtime. For one week for sleep after finish 15mg      Physical Exam: Filed Vitals:   03/05/14 1350  BP: 130/72  Pulse: 60  Resp: 12  Height: 5' 3.08" (1.602 m)  Weight: 223 lb (101.152 kg)  SpO2: 95%  Physical Exam  Constitutional: She is oriented to person, place, and time.  Obese white female, walks with rollator walker slowly  HENT:  Head: Normocephalic.  Right Ear: External ear normal.  Left Ear: External ear normal.  Nose: Nose normal.  Mouth/Throat: Oropharynx is clear and moist. No oropharyngeal exudate.  TMs pink with good light reflex bilaterally, still had difficulty hearing even with hearing aides today  Eyes: Conjunctivae and EOM are normal. Pupils are equal, round, and reactive to light.  Wearing glasses  Neck: Normal range of motion. Neck supple. No JVD present. No tracheal deviation present. No thyromegaly present.  Cardiovascular: Normal rate, regular rhythm, normal heart sounds and intact distal pulses.   No murmur heard. Pulmonary/Chest: Effort normal and breath sounds normal. No respiratory distress.  Abdominal: Soft. Bowel sounds are normal. She exhibits no distension and no mass. There is no tenderness.  Genitourinary:  No suprapubic tenderness  Musculoskeletal: She exhibits tenderness. She exhibits no edema.  Restricted ROM of lumbar spine due to prior surgery;  Tenderness esp over left SI joint region  Neurological: She is alert and oriented to person, place, and time. She displays normal reflexes. No cranial nerve deficit. She exhibits normal muscle tone. Coordination normal.  Skin:  Scar in lumbar spine and anterior neck from prior  surgeries  Psychiatric:  Tearful today    Labs reviewed: Basic Metabolic Panel:  Recent Labs  07/03/13 0918  10/20/13 0859 11/23/13 1057 02/12/14 1122  NA 141  < > 141 138 142  K 4.5  < > 4.7 4.0 4.4  CL 105  < > 101 101 102  CO2 26  --   --  26 24  GLUCOSE 102*  < > 102* 112* 86  BUN 24*  < > 19 23 26   CREATININE 0.88  < > 1.10 0.79 0.84  CALCIUM 9.1  --   --  8.6 8.9  TSH  --   --   --   --  1.980  < > = values in this interval not displayed. Liver Function Tests:  Recent Labs  07/03/13 0918 02/12/14 1122  AST 16 23  ALT 9 15  ALKPHOS 114 85  BILITOT 0.3 <0.2  PROT 6.4 6.4  ALBUMIN 3.6  --   CBC:  Recent Labs  07/03/13 0918  10/20/13 0859 11/23/13 1057 02/12/14 1122  WBC 6.8  --   --  6.7 5.6  NEUTROABS 4.4  --   --   --  3.2  HGB 12.9  < > 13.9 12.4 12.4  HCT 38.3  < > 41.0 37.5 37.4  MCV 86.3  --   --  83.7 81  PLT 236  --   --  251 304  < > = values in this interval not displayed. Lipid Panel:  Recent Labs  02/12/14 1122  HDL 49  LDLCALC 124*  TRIG 131  CHOLHDL 4.1   Lab Results  Component Value Date   HGBA1C 6.0* 02/12/2014   Assessment/Plan 1.  Insomnia -sleep has been terrible as I attempted to taper her off restoril and stopped benadryl--she was very upset today about this poor transition (I had warned her, but it did not make it any easier) -will now try belsomra 10mg --if she sleeps with this dose, will keep the same, but if she is still having difficulty sleeping by Monday, I asked her to call us back so I can increase the dose--will have her simply take 2 pills - Suvorexant (BELSOMRA) 10 MG TABS; Take 10 mg by mouth at bedtime. For sleep  Dispense: 30 tablet; Refill: 3  2. DVT (deep venous thrombosis) -on longstanding coumadin now--will discuss alternatives at future visits to avoid interactions and need for INRs, will also discuss with [name] to see what she thinks  3. Long term (current) use of anticoagulants -is now following  with Tiv[facility] Ringer for INR checks here  4. Lumbar radiculopathy -has actually been better lately -has had prior back surgery, walks with rollator walker -continues on percocet and mobic for pain (can interact with coumadin)  5. Obesity, morbid -pt has lost 10 lbs which is wonderful--encouraged her today and she has made significant dietary changes and was quite active during her reunion trip -encouraged to continue walking now that she is home  6. Hyperglycemia -will f/u hba1c in 3 mos   Health maintenance reviewed and updated.  MMSE done with passed clock and score of 30/30, should get prevnar and shingles but otherwise up to date on vaccinations, genetic testing results also reviewed  Next appt:  1 month

## 2014-03-05 NOTE — Progress Notes (Signed)
Passed clock drawing 

## 2014-03-10 ENCOUNTER — Telehealth: Payer: Self-pay | Admitting: *Deleted

## 2014-03-10 NOTE — Telephone Encounter (Signed)
Patient called and stated that she is still not sleeping. Medication is not working. And you told her to call if it did not work. Please Advise.

## 2014-03-11 ENCOUNTER — Other Ambulatory Visit: Payer: Self-pay | Admitting: *Deleted

## 2014-03-11 DIAGNOSIS — G4700 Insomnia, unspecified: Secondary | ICD-10-CM

## 2014-03-11 MED ORDER — SUVOREXANT 10 MG PO TABS: 20.0000 mg | ORAL_TABLET | Freq: Every day | ORAL | Status: DC

## 2014-03-11 NOTE — Telephone Encounter (Signed)
Please ask her to take 2 of the belsomra pills instead of one as we discussed we would do if one did not work.

## 2014-03-11 NOTE — Telephone Encounter (Signed)
Patient Notified and changed dosage in chart

## 2014-03-13 ENCOUNTER — Ambulatory Visit (INDEPENDENT_AMBULATORY_CARE_PROVIDER_SITE_OTHER): Payer: Medicare Other | Admitting: General Practice

## 2014-03-13 DIAGNOSIS — Z7901 Long term (current) use of anticoagulants: Secondary | ICD-10-CM | POA: Diagnosis not present

## 2014-03-13 DIAGNOSIS — Z5181 Encounter for therapeutic drug level monitoring: Secondary | ICD-10-CM | POA: Diagnosis not present

## 2014-03-13 DIAGNOSIS — I82409 Acute embolism and thrombosis of unspecified deep veins of unspecified lower extremity: Secondary | ICD-10-CM

## 2014-03-13 LAB — POCT INR: INR: 2.2

## 2014-03-13 NOTE — Progress Notes (Signed)
Pre visit review using our clinic review tool, if applicable. No additional management support is needed unless otherwise documented below in the visit note. 

## 2014-03-16 ENCOUNTER — Ambulatory Visit: Payer: Medicare Other | Admitting: Pharmacotherapy

## 2014-03-17 ENCOUNTER — Telehealth: Payer: Self-pay | Admitting: *Deleted

## 2014-03-17 NOTE — Telephone Encounter (Signed)
Patient called and stated she is not sleeping. Scheduled patient an appointment to come in tomorrow

## 2014-03-18 ENCOUNTER — Ambulatory Visit (INDEPENDENT_AMBULATORY_CARE_PROVIDER_SITE_OTHER): Payer: Medicare Other | Admitting: Nurse Practitioner

## 2014-03-18 ENCOUNTER — Encounter: Payer: Self-pay | Admitting: Nurse Practitioner

## 2014-03-18 VITALS — BP 164/96 | HR 77 | Temp 98.8°F | Resp 20 | Ht 65.0 in | Wt 223.6 lb

## 2014-03-18 DIAGNOSIS — G4700 Insomnia, unspecified: Secondary | ICD-10-CM | POA: Diagnosis not present

## 2014-03-18 MED ORDER — TRAZODONE HCL 50 MG PO TABS: 25.0000 mg | ORAL_TABLET | Freq: Every evening | ORAL | Status: DC | PRN

## 2014-03-18 NOTE — Patient Instructions (Signed)
-  start trazodone 25 mg at night - after 1 week if you need to increase to 50 for sleep  -increase activity during the day- you need to exert yourself to be tired at night -try eating more fruits and vegetables and less processed food. -get rid of all lights in your immediate sleep area, esp blue lights   Keep follow up with Mariea Clonts

## 2014-03-18 NOTE — Addendum Note (Signed)
Addended by: Eilene Ghazi on: 03/18/2014 12:36 PM   Modules accepted: Orders

## 2014-03-18 NOTE — Progress Notes (Signed)
Patient ID: Andrea [name], female   DOB: 05/07/1935, 79 y.o.   MRN: [id number]    Allergies  Allergen Reactions  . Clindamycin Hcl Other (See Comments)    REACTION: swelling, pt. Reports that she had a 16 lb. weightgain in one day    Chief Complaint  Patient presents with  . Follow-up    having problems sleeping    HPI: Patient is a 79 y.o. female seen in the office today for insomnia; took suvorexant 1 tablet which didn't not help and then increase to 2 tablets for sleep, this did not help 59 old Restoril which finally helped her sleep, Dr Mariea Clonts took her off resteril and benedryl but she doesn't understand why In the last week she avgs about 4 hours of sleep  Has not tired anything else for sleep  Was on cymbalta and Wellbutrin but abruptly stopped this in january  Has tried trazodone in the past which helped Does not do much during the day Review of Systems:  Review of Systems  Constitutional: Positive for weight loss and malaise/fatigue. Negative for fever.  HENT: Positive for hearing loss.        Wears hearing aides  Eyes: Negative for blurred vision.  Respiratory: Negative for shortness of breath.   Cardiovascular: Negative for chest pain and leg swelling.  Gastrointestinal: Negative for heartburn, diarrhea and constipation.  Genitourinary: Negative for dysuria.  Musculoskeletal: Negative for falls.  Skin: Negative for rash.  Neurological: Negative for loss of consciousness and weakness.  Endo/Heme/Allergies: Bruises/bleeds easily.       On coumadin  Psychiatric/Behavioral: Positive for depression (worse without sleep). The patient has insomnia.      Past Medical History  Diagnosis Date  . Hypotension   . Depression with anxiety   . Cough     Wert-onset 08/2009, as of 2014- resolved   . Deep venous thrombosis     post op, rec'd/needed  blood thinner   . Cervicalgia   . Hypothyroidism   . Blood transfusion   . Constipation   . Shortness of breath   . Anxiety     . Dysrhythmia   . Anemia   . Depression   . Lumbar radiculopathy 09/26/2011  . Headache(784.0) 09/27/2011  . Pneumonia     never been in hosp. for pneumonia   . Sleep apnea     doesn't use CPAP any longer, since weight loss  . Arthritis     hands, spine   . History of stress test     15-20 yrs.ago   . Falls frequently     pt. reports that she was falling 3 times a day, has had PT at a facility for a while & now is getting home PT 2-3 times/ week   . Right hand fracture     Monday  . Prediabetes    Past Surgical History  Procedure Laterality Date  . Replacement total knee bilateral    . Appendectomy    . Breast excisional biopsy      breast bxs on left x 2, breast bx of right x 1-all benign  . Laparoscopic ovarian cystectomy    . Joint replacement  [id number]    bilateral  . Tonsillectomy    . Eye surgery      cataracts bilateral /w IOL  . Hernia repair      umbilical hernia- 9983  . Back surgery      2000  . Anterior cervical decomp/discectomy fusion N/A 07/09/2013  Procedure: ANTERIOR CERVICAL DECOMPRESSION/DISCECTOMY FUSION 3 LEVELS;  Surgeon: Sinclair Ship, MD;  Location: [place];  Service: Orthopedics;  Laterality: N/A;  Anterior cervical decompression fusion, cervical 3-4, cervical 4-5-, cervical 5-6 with instrumentation, allograft.   Social History:   reports that she quit smoking about 45 years ago. Her smoking use included Cigarettes. She has a 10 pack-year smoking history. She has never used smokeless tobacco. She reports that she drinks about .6 ounces of alcohol per week. She reports that she does not use illicit drugs.  Family History  Problem Relation Age of Onset  . Heart disease Father   . Malignant hyperthermia Father   . Arthritis Father   . Deep vein thrombosis Son   . Diabetes Son   . Obesity Son   . Depression Son   . Hypertension Son   . Parkinson's disease Mother   . Diabetes Daughter   . Hypertension Daughter   . Anxiety disorder  Daughter   . Cancer Maternal Aunt   . Hyperlipidemia Son   . Hypertension Son     Medications: Patient's Medications  New Prescriptions   No medications on file  Previous Medications   ALBUTEROL (PROVENTIL HFA;VENTOLIN HFA) 108 (90 BASE) MCG/ACT INHALER    Inhale 2 puffs into the lungs every 6 (six) hours as needed for wheezing or shortness of breath.   CALCIUM CARBONATE (TUMS EX) 750 MG CHEWABLE TABLET    Chew 1 tablet by mouth daily.   CLONAZEPAM (KLONOPIN) 0.5 MG TABLET    Take 0.5 mg by mouth as needed for anxiety.    LEVOTHYROXINE (SYNTHROID, LEVOTHROID) 50 MCG TABLET    Take 50 mcg by mouth every morning.    MELOXICAM (MOBIC) 7.5 MG TABLET    Take 1 tablet (7.5 mg total) by mouth daily.   MULTIPLE VITAMINS-MINERALS (MULTIVITAMIN PO)    Take 1 tablet by mouth.   OXYCODONE-ACETAMINOPHEN (PERCOCET) 10-325 MG PER TABLET    Take 1-2 tablets by mouth every 4-6 hours as needed for Arthritis pain   SUVOREXANT (BELSOMRA) 10 MG TABS    Take 20 mg by mouth at bedtime. For sleep   WARFARIN (COUMADIN) 5 MG TABLET    Take 1 tablet daily except 1/2 tablet on Wednesdays  Modified Medications   No medications on file  Discontinued Medications   No medications on file     Physical Exam:  Filed Vitals:   03/18/14 1140  BP: 164/96  Pulse: 77  Temp: 98.8 F (37.1 C)  TempSrc: Oral  Resp: 20  Height: 5\' 5"  (1.651 m)  Weight: 223 lb 9.6 oz (101.424 kg)  SpO2: 97%    Physical Exam  Constitutional: She is oriented to person, place, and time and well-developed, well-nourished, and in no distress.  Cardiovascular: Normal rate, regular rhythm and normal heart sounds.   Pulmonary/Chest: Effort normal and breath sounds normal.  Abdominal: Soft. Bowel sounds are normal.  Neurological: She is alert and oriented to person, place, and time.  Skin: Skin is warm and dry.  Psychiatric: Affect normal.     Labs reviewed: Basic Metabolic Panel:  Recent Labs  07/03/13 0918  10/20/13 0859  11/23/13 1057 02/12/14 1122  NA 141  < > 141 138 142  K 4.5  < > 4.7 4.0 4.4  CL 105  < > 101 101 102  CO2 26  --   --  26 24  GLUCOSE 102*  < > 102* 112* 86  BUN 24*  < > 19  23 26  CREATININE 0.88  < > 1.10 0.79 0.84  CALCIUM 9.1  --   --  8.6 8.9  TSH  --   --   --   --  1.980  < > = values in this interval not displayed. Liver Function Tests:  Recent Labs  07/03/13 0918 02/12/14 1122  AST 16 23  ALT 9 15  ALKPHOS 114 85  BILITOT 0.3 <0.2  PROT 6.4 6.4  ALBUMIN 3.6  --    No results found for this basename: LIPASE, AMYLASE,  in the last 8760 hours No results found for this basename: AMMONIA,  in the last 8760 hours CBC:  Recent Labs  07/03/13 0918  10/20/13 0859 11/23/13 1057 02/12/14 1122  WBC 6.8  --   --  6.7 5.6  NEUTROABS 4.4  --   --   --  3.2  HGB 12.9  < > 13.9 12.4 12.4  HCT 38.3  < > 41.0 37.5 37.4  MCV 86.3  --   --  83.7 81  PLT 236  --   --  251 304  < > = values in this interval not displayed. Lipid Panel:  Recent Labs  02/12/14 1122  HDL 49  LDLCALC 124*  TRIG 131  CHOLHDL 4.1   TSH:  Recent Labs  02/12/14 1122  TSH 1.980   A1C: Lab Results  Component Value Date   HGBA1C 6.0* 02/12/2014    Assessment/Plan 1. Insomnia -extensive education done regarding sleep and medication management -encouraged to increase activity during the day so she is tired at night -not to nap during the day -sleep routine -to turn off all lights in room -to start trazodone 25 mg at night for a week if needed increase to 50 after 1 week - traZODone (DESYREL) 50 MG tablet; Take 0.5-1 tablets (25-50 mg total) by mouth at bedtime as needed for sleep.  Dispense: 30 tablet; Refill: 3  Keep follow up with Mariea Clonts  25 Time TOTAL:  time greater than 50% of total time spent doing pt counseled re: insomnia and medication management

## 2014-03-19 ENCOUNTER — Other Ambulatory Visit: Payer: Self-pay | Admitting: *Deleted

## 2014-03-19 ENCOUNTER — Other Ambulatory Visit: Payer: Self-pay | Admitting: Internal Medicine

## 2014-03-19 DIAGNOSIS — M810 Age-related osteoporosis without current pathological fracture: Secondary | ICD-10-CM

## 2014-03-19 DIAGNOSIS — R933 Abnormal findings on diagnostic imaging of other parts of digestive tract: Secondary | ICD-10-CM

## 2014-03-24 ENCOUNTER — Ambulatory Visit
Admission: RE | Admit: 2014-03-24 | Discharge: 2014-03-24 | Disposition: A | Discharge status: Home / Self Care | Payer: Medicare Other | Source: Ambulatory Visit | Attending: Internal Medicine | Admitting: Internal Medicine

## 2014-03-24 ENCOUNTER — Encounter (INDEPENDENT_AMBULATORY_CARE_PROVIDER_SITE_OTHER): Payer: Self-pay

## 2014-03-24 DIAGNOSIS — M810 Age-related osteoporosis without current pathological fracture: Secondary | ICD-10-CM

## 2014-03-24 DIAGNOSIS — M899 Disorder of bone, unspecified: Secondary | ICD-10-CM | POA: Diagnosis not present

## 2014-03-24 DIAGNOSIS — M949 Disorder of cartilage, unspecified: Secondary | ICD-10-CM | POA: Diagnosis not present

## 2014-03-24 LAB — HM DEXA SCAN

## 2014-03-26 DIAGNOSIS — F325 Major depressive disorder, single episode, in full remission: Secondary | ICD-10-CM | POA: Diagnosis not present

## 2014-04-03 DIAGNOSIS — M545 Low back pain, unspecified: Secondary | ICD-10-CM | POA: Diagnosis not present

## 2014-04-06 ENCOUNTER — Ambulatory Visit (INDEPENDENT_AMBULATORY_CARE_PROVIDER_SITE_OTHER): Payer: Medicare Other | Admitting: Pharmacotherapy

## 2014-04-06 ENCOUNTER — Encounter: Payer: Self-pay | Admitting: *Deleted

## 2014-04-06 ENCOUNTER — Telehealth: Payer: Self-pay | Admitting: *Deleted

## 2014-04-06 ENCOUNTER — Encounter: Payer: Self-pay | Admitting: Pharmacotherapy

## 2014-04-06 VITALS — BP 122/78 | HR 53 | Wt 224.0 lb

## 2014-04-06 DIAGNOSIS — Z7901 Long term (current) use of anticoagulants: Secondary | ICD-10-CM

## 2014-04-06 DIAGNOSIS — G4700 Insomnia, unspecified: Secondary | ICD-10-CM

## 2014-04-06 DIAGNOSIS — G894 Chronic pain syndrome: Secondary | ICD-10-CM

## 2014-04-06 DIAGNOSIS — I82409 Acute embolism and thrombosis of unspecified deep veins of unspecified lower extremity: Secondary | ICD-10-CM | POA: Diagnosis not present

## 2014-04-06 LAB — POCT INR: INR: 3.3

## 2014-04-06 MED ORDER — WARFARIN SODIUM 5 MG PO TABS: ORAL_TABLET | ORAL | Status: DC

## 2014-04-06 NOTE — Progress Notes (Signed)
Subjective:     Patient ID: Andrea [name], female   DOB: 05-30-1935, 79 y.o.   MRN: [id number]  HPI Last INR was 2.7 while she was in [place]. Current Coumadin dose is 5mg  QD except 2.5mg  Wed. Denies missed doses. Denies unusual bleeding or bruising. Denies CP, SOB. Consistent with vitamin K intake.  Review of Systems  HENT: Negative for nosebleeds.   Respiratory: Negative for shortness of breath.   Cardiovascular: Negative for chest pain.  Gastrointestinal: Negative for blood in stool and anal bleeding.  Genitourinary: Negative for hematuria.       Objective:   Physical Exam  Vitals reviewed. Constitutional: She is oriented to person, place, and time. She appears well-developed and well-nourished.  HENT:  Right Ear: External ear normal.  Left Ear: External ear normal.  Eyes: Pupils are equal, round, and reactive to light.  Cardiovascular: Normal rate, regular rhythm and normal heart sounds.   Pulmonary/Chest: Effort normal and breath sounds normal.  Musculoskeletal:  Requires rolling / seated walker for assist.  Neurological: She is alert and oriented to person, place, and time.  Skin: Skin is warm and dry.  Psychiatric: She has a normal mood and affect. Her behavior is normal.   BP:  122/78   HR:  53   Wt:  224lb     Assessment:     INR 3.3     Plan:     1.  INR above goal 2-3 2.  Decrease Coumadin 5mg  QD except 2.5mg  T/F 3.  INR 2 weeks

## 2014-04-06 NOTE — Patient Instructions (Signed)
INR 3.3 Decrease Coumadin to 1 tablet (5mg ) daily except 1/2 tablet (2.5mg ) on Tuesdays and Fridays

## 2014-04-06 NOTE — Telephone Encounter (Signed)
Called patient to inform her of dexa scan results, she stated that she is presently taking her calcium with Vitamin D(2000 units)

## 2014-04-09 DIAGNOSIS — M545 Low back pain, unspecified: Secondary | ICD-10-CM | POA: Diagnosis not present

## 2014-04-15 DIAGNOSIS — M545 Low back pain, unspecified: Secondary | ICD-10-CM | POA: Diagnosis not present

## 2014-04-16 DIAGNOSIS — M545 Low back pain, unspecified: Secondary | ICD-10-CM | POA: Diagnosis not present

## 2014-04-20 ENCOUNTER — Ambulatory Visit (INDEPENDENT_AMBULATORY_CARE_PROVIDER_SITE_OTHER): Payer: Medicare Other | Admitting: Pharmacotherapy

## 2014-04-20 ENCOUNTER — Encounter: Payer: Self-pay | Admitting: Pharmacotherapy

## 2014-04-20 ENCOUNTER — Other Ambulatory Visit: Payer: Self-pay | Admitting: Internal Medicine

## 2014-04-20 VITALS — BP 124/80 | HR 60 | Temp 98.0°F | Wt 223.0 lb

## 2014-04-20 DIAGNOSIS — M545 Low back pain, unspecified: Secondary | ICD-10-CM

## 2014-04-20 DIAGNOSIS — I82409 Acute embolism and thrombosis of unspecified deep veins of unspecified lower extremity: Secondary | ICD-10-CM | POA: Diagnosis not present

## 2014-04-20 DIAGNOSIS — R300 Dysuria: Secondary | ICD-10-CM

## 2014-04-20 DIAGNOSIS — Z7901 Long term (current) use of anticoagulants: Secondary | ICD-10-CM

## 2014-04-20 LAB — POCT INR: INR: 2.3

## 2014-04-20 NOTE — Addendum Note (Signed)
Addended by: Jearld Adjutant on: 04/20/2014 11:41 AM   Modules accepted: Orders

## 2014-04-20 NOTE — Progress Notes (Signed)
Subjective:     Patient ID: Andrea [name], female   DOB: 25-Dec-1934, 79 y.o.   MRN: [id number]  HPI Last INR was high at 3.3. Coumadin was decreased to 5mg  daily except 2.5mg  T/F. Denies missed doses. Denies unusual bleeding or bruising. Denies CP,SOB, falls Is complaining of burning with urination. Consistent with vitamin K intake   Review of Systems  HENT: Negative for nosebleeds.   Respiratory: Negative for shortness of breath.   Cardiovascular: Negative for chest pain.  Gastrointestinal: Negative for blood in stool and anal bleeding.  Genitourinary: Positive for dysuria and urgency. Negative for hematuria.       Objective:   Physical Exam  Constitutional: She is oriented to person, place, and time. She appears well-developed and well-nourished.  HENT:  Right Ear: External ear normal.  Left Ear: External ear normal.  Cardiovascular: Normal rate, regular rhythm and normal heart sounds.   Pulmonary/Chest: Effort normal and breath sounds normal.  Neurological: She is alert and oriented to person, place, and time.  Skin: Skin is warm and dry.  Psychiatric: She has a normal mood and affect. Her behavior is normal.    BP:  124/80   HR:  60  Wt:  223     Assessment:     INR 2.3     Plan:     1.  INR at goal 2-3 2.  Continue Coumadin 5mg  QD except 2.5mg  T/F 3.  RTC in 1 month

## 2014-04-20 NOTE — Patient Instructions (Signed)
INR 2.3 Continue Coumadin 5mg  daily except 2.5mg  (1/2 tablet) on Tuesdays and Fridays

## 2014-04-21 ENCOUNTER — Other Ambulatory Visit: Payer: Self-pay | Admitting: *Deleted

## 2014-04-21 DIAGNOSIS — M545 Low back pain, unspecified: Secondary | ICD-10-CM | POA: Diagnosis not present

## 2014-04-21 LAB — URINALYSIS
Bilirubin, UA: NEGATIVE
Glucose, UA: NEGATIVE
Ketones, UA: NEGATIVE
Nitrite, UA: NEGATIVE
Protein, UA: NEGATIVE
Specific Gravity, UA: 1.009 (ref 1.005–1.030)
Urobilinogen, Ur: 0.2 mg/dL (ref 0.0–1.9)
pH, UA: 7 (ref 5.0–7.5)

## 2014-04-21 MED ORDER — OXYCODONE-ACETAMINOPHEN 10-325 MG PO TABS: ORAL_TABLET | ORAL | Status: DC

## 2014-04-22 ENCOUNTER — Encounter: Payer: Self-pay | Admitting: Nurse Practitioner

## 2014-04-22 ENCOUNTER — Ambulatory Visit (INDEPENDENT_AMBULATORY_CARE_PROVIDER_SITE_OTHER): Payer: Medicare Other | Admitting: Nurse Practitioner

## 2014-04-22 VITALS — BP 118/80 | HR 61 | Temp 97.5°F | Resp 10 | Wt 223.0 lb

## 2014-04-22 DIAGNOSIS — N390 Urinary tract infection, site not specified: Secondary | ICD-10-CM

## 2014-04-22 LAB — URINE CULTURE

## 2014-04-22 MED ORDER — AMOXICILLIN-POT CLAVULANATE 875-125 MG PO TABS: 1.0000 | ORAL_TABLET | Freq: Two times a day (BID) | ORAL | Status: DC

## 2014-04-22 NOTE — Progress Notes (Signed)
Patient ID: Andrea [name], female   DOB: 09-Mar-1935, 79 y.o.   MRN: [id number]    Allergies  Allergen Reactions  . Clindamycin Hcl Other (See Comments)    REACTION: swelling, pt. Reports that she had a 16 lb. weightgain in one day    Chief Complaint  Patient presents with  . Pain    Patient c/o pain bilaterally at waist line around to lower back x 2 weeks. Pain increased within 24 hours     HPI: Patient is a 79 y.o. female seen in the office today for pain at her waist line that goes into back bilaterally, feels hot and painful. Off and on reports pain when urination. Did not think it was UTI because in the past it was constant but now it has been on one day and not the next. Symptoms have been on-going for 4 weeks. Reports Increased temperature in the afternoon but never over 99.0 but she feels bad Still eating and drinking ok.   Pt reported that she was on once daily nitrofurantion due to frequent UTI when she was with her previous PCP but decided to stop taking it on her own  Review of Systems:  Review of Systems  Constitutional: Positive for malaise/fatigue. Negative for fever and chills.  Respiratory: Positive for shortness of breath (with activity, ongoing problem).   Cardiovascular: Positive for palpitations (at times). Negative for chest pain.  Gastrointestinal: Positive for constipation. Negative for abdominal pain and diarrhea.  Genitourinary: Positive for dysuria, urgency and frequency.       Needing to wear depends due to incontinence   Musculoskeletal: Positive for back pain and myalgias.     Past Medical History  Diagnosis Date  . Hypotension   . Depression with anxiety   . Cough     Wert-onset 08/2009, as of 2014- resolved   . Deep venous thrombosis     post op, rec'd/needed  blood thinner   . Cervicalgia   . Hypothyroidism   . Blood transfusion   . Constipation   . Shortness of breath   . Anxiety   . Dysrhythmia   . Anemia   . Depression   . Lumbar  radiculopathy 09/26/2011  . Headache(784.0) 09/27/2011  . Pneumonia     never been in hosp. for pneumonia   . Sleep apnea     doesn't use CPAP any longer, since weight loss  . Arthritis     hands, spine   . History of stress test     15-20 yrs.ago   . Falls frequently     pt. reports that she was falling 3 times a day, has had PT at a facility for a while & now is getting home PT 2-3 times/ week   . Right hand fracture     Monday  . Prediabetes    Past Surgical History  Procedure Laterality Date  . Replacement total knee bilateral    . Appendectomy    . Breast excisional biopsy      breast bxs on left x 2, breast bx of right x 1-all benign  . Laparoscopic ovarian cystectomy    . Joint replacement  [id number]    bilateral  . Tonsillectomy    . Eye surgery      cataracts bilateral /w IOL  . Hernia repair      umbilical hernia- [id number]  . Back surgery      2000  . Anterior cervical decomp/discectomy fusion N/A 07/09/2013    Procedure: ANTERIOR  CERVICAL DECOMPRESSION/DISCECTOMY FUSION 3 LEVELS;  Surgeon: Sinclair Ship, MD;  Location: [place] [facility];  Service: Orthopedics;  Laterality: N/A;  Anterior cervical decompression fusion, cervical 3-4, cervical 4-5-, cervical 5-6 with instrumentation, allograft.   Social History:   reports that she quit smoking about 45 years ago. Her smoking use included Cigarettes. She has a 10 pack-year smoking history. She has never used smokeless tobacco. She reports that she drinks about .6 ounces of alcohol per week. She reports that she does not use illicit drugs.  Family History  Problem Relation Age of Onset  . Heart disease Father   . Malignant hyperthermia Father   . Arthritis Father   . Deep vein thrombosis Son   . Diabetes Son   . Obesity Son   . Depression Son   . Hypertension Son   . Parkinson's disease Mother   . Diabetes Daughter   . Hypertension Daughter   . Anxiety disorder Daughter   . Cancer Maternal Aunt   . Hyperlipidemia Son     . Hypertension Son     Medications: Patient's Medications  New Prescriptions   No medications on file  Previous Medications   ALBUTEROL (PROVENTIL HFA;VENTOLIN HFA) 108 (90 BASE) MCG/ACT INHALER    Inhale 2 puffs into the lungs every 6 (six) hours as needed for wheezing or shortness of breath.   CALCIUM CARBONATE (TUMS EX) 750 MG CHEWABLE TABLET    Chew 1 tablet by mouth daily.   CLONAZEPAM (KLONOPIN) 0.5 MG TABLET    Take 0.5 mg by mouth as needed for anxiety.    DULOXETINE HCL (CYMBALTA PO)    Take by mouth. 2 by mouth daily, Patient not sure of dose, RX'ed by specialist   LEVOTHYROXINE (SYNTHROID, LEVOTHROID) 50 MCG TABLET    Take 50 mcg by mouth every morning.    MELOXICAM (MOBIC) 7.5 MG TABLET    Take 1 tablet (7.5 mg total) by mouth daily.   MULTIPLE VITAMINS-MINERALS (MULTIVITAMIN PO)    Take 1 tablet by mouth.   OXYCODONE-ACETAMINOPHEN (PERCOCET) 10-325 MG PER TABLET    Take 1-2 tablets by mouth every 4-6 hours as needed for Arthritis pain   TRAZODONE (DESYREL) 300 MG TABLET    Take 300 mg by mouth at bedtime.   WARFARIN (COUMADIN) 5 MG TABLET    Take 1 tablet daily except 1/2 tablet on Tuesdays and Fridays  Modified Medications   No medications on file  Discontinued Medications   No medications on file     Physical Exam:  Filed Vitals:   04/22/14 1053  BP: 118/80  Pulse: 61  Temp: 97.5 F (36.4 C)  TempSrc: Oral  Resp: 10  Weight: 223 lb (101.152 kg)  SpO2: 93%    Physical Exam  Constitutional: She is oriented to person, place, and time and well-developed, well-nourished, and in no distress.  Eyes: Conjunctivae and EOM are normal. Pupils are equal, round, and reactive to light.  Cardiovascular: Normal rate, regular rhythm and normal heart sounds.   Pulmonary/Chest: Effort normal and breath sounds normal.  Abdominal: Soft. Normal appearance and bowel sounds are normal. She exhibits no distension. There is no tenderness. There is CVA tenderness.  Musculoskeletal:  She exhibits no edema.  Neurological: She is alert and oriented to person, place, and time.  Skin: Skin is warm and dry.  Psychiatric: Affect normal.     Labs reviewed: Basic Metabolic Panel:  Recent Labs  07/03/13 0918  10/20/13 0859 11/23/13 1057 02/12/14 1122  NA 141  < >  141 138 142  K 4.5  < > 4.7 4.0 4.4  CL 105  < > 101 101 102  CO2 26  --   --  26 24  GLUCOSE 102*  < > 102* 112* 86  BUN 24*  < > 19 23 26   CREATININE 0.88  < > 1.10 0.79 0.84  CALCIUM 9.1  --   --  8.6 8.9  TSH  --   --   --   --  1.980  < > = values in this interval not displayed. Liver Function Tests:  Recent Labs  07/03/13 0918 02/12/14 1122  AST 16 23  ALT 9 15  ALKPHOS 114 85  BILITOT 0.3 <0.2  PROT 6.4 6.4  ALBUMIN 3.6  --    No results found for this basename: LIPASE, AMYLASE,  in the last 8760 hours No results found for this basename: AMMONIA,  in the last 8760 hours CBC:  Recent Labs  07/03/13 0918  10/20/13 0859 11/23/13 1057 02/12/14 1122  WBC 6.8  --   --  6.7 5.6  NEUTROABS 4.4  --   --   --  3.2  HGB 12.9  < > 13.9 12.4 12.4  HCT 38.3  < > 41.0 37.5 37.4  MCV 86.3  --   --  83.7 81  PLT 236  --   --  251 304  < > = values in this interval not displayed. Lipid Panel:  Recent Labs  02/12/14 1122  HDL 49  LDLCALC 124*  TRIG 131  CHOLHDL 4.1   TSH:  Recent Labs  02/12/14 1122  TSH 1.980   A1C: Lab Results  Component Value Date   HGBA1C 6.0* 02/12/2014     Assessment/Plan  1. UTI (urinary tract infection) -to increase hydration, good oral intake - CBC With differential/Platelet - Basic metabolic panel - amoxicillin-clavulanate (AUGMENTIN) 875-125 MG per tablet; Take 1 tablet by mouth 2 (two) times daily.  Dispense: 20 tablet; Refill: 0 while awaiting sensitivities  -educated on seeking medical attention if fever or chills occur while on medication or if symptoms get worse  -will have her follow up INR in 2 weeks  To keep follow up with Mariea Clonts

## 2014-04-22 NOTE — Patient Instructions (Addendum)
Increase hydration Augmentin twice daily for 10 days- while awaiting sensitivities   Urinary Tract Infection Urinary tract infections (UTIs) can develop anywhere along your urinary tract. Your urinary tract is your body's drainage system for removing wastes and extra water. Your urinary tract includes two kidneys, two ureters, a bladder, and a urethra. Your kidneys are a pair of bean-shaped organs. Each kidney is about the size of your fist. They are located below your ribs, one on each side of your spine. CAUSES Infections are caused by microbes, which are microscopic organisms, including fungi, viruses, and bacteria. These organisms are so small that they can only be seen through a microscope. Bacteria are the microbes that most commonly cause UTIs. SYMPTOMS  Symptoms of UTIs may vary by age and gender of the patient and by the location of the infection. Symptoms in young women typically include a frequent and intense urge to urinate and a painful, burning feeling in the bladder or urethra during urination. Older women and men are more likely to be tired, shaky, and weak and have muscle aches and abdominal pain. A fever may mean the infection is in your kidneys. Other symptoms of a kidney infection include pain in your back or sides below the ribs, nausea, and vomiting. DIAGNOSIS To diagnose a UTI, your caregiver will ask you about your symptoms. Your caregiver also will ask to provide a urine sample. The urine sample will be tested for bacteria and white blood cells. White blood cells are made by your body to help fight infection. TREATMENT  Typically, UTIs can be treated with medication. Because most UTIs are caused by a bacterial infection, they usually can be treated with the use of antibiotics. The choice of antibiotic and length of treatment depend on your symptoms and the type of bacteria causing your infection. HOME CARE INSTRUCTIONS  If you were prescribed antibiotics, take them exactly as  your caregiver instructs you. Finish the medication even if you feel better after you have only taken some of the medication.  Drink enough water and fluids to keep your urine clear or pale yellow.  Avoid caffeine, tea, and carbonated beverages. They tend to irritate your bladder.  Empty your bladder often. Avoid holding urine for long periods of time.  Empty your bladder before and after sexual intercourse.  After a bowel movement, women should cleanse from front to back. Use each tissue only once. SEEK MEDICAL CARE IF:   You have back pain.  You develop a fever.  Your symptoms do not begin to resolve within 3 days. SEEK IMMEDIATE MEDICAL CARE IF:   You have severe back pain or lower abdominal pain.  You develop chills.  You have nausea or vomiting.  You have continued burning or discomfort with urination. MAKE SURE YOU:   Understand these instructions.  Will watch your condition.  Will get help right away if you are not doing well or get worse. Document Released: 08/16/2005 Document Revised: 05/07/2012 Document Reviewed: 12/15/2011 [facility] Patient Information 2014 [place].

## 2014-04-23 DIAGNOSIS — M545 Low back pain, unspecified: Secondary | ICD-10-CM | POA: Diagnosis not present

## 2014-04-23 LAB — CBC WITH DIFFERENTIAL
Basophils Absolute: 0 10*3/uL (ref 0.0–0.2)
Basos: 0 %
EOS: 3 %
Eosinophils Absolute: 0.2 10*3/uL (ref 0.0–0.4)
HCT: 37.7 % (ref 34.0–46.6)
Hemoglobin: 12.2 g/dL (ref 11.1–15.9)
Immature Grans (Abs): 0 10*3/uL (ref 0.0–0.1)
Immature Granulocytes: 0 %
Lymphocytes Absolute: 1.3 10*3/uL (ref 0.7–3.1)
Lymphs: 22 %
MCH: 27.1 pg (ref 26.6–33.0)
MCHC: 32.4 g/dL (ref 31.5–35.7)
MCV: 84 fL (ref 79–97)
MONOCYTES: 4 %
Monocytes Absolute: 0.3 10*3/uL (ref 0.1–0.9)
Neutrophils Absolute: 4.2 10*3/uL (ref 1.4–7.0)
Neutrophils Relative %: 71 %
Platelets: 195 10*3/uL (ref 150–379)
RBC: 4.51 x10E6/uL (ref 3.77–5.28)
RDW: 16 % — ABNORMAL HIGH (ref 12.3–15.4)
WBC: 6 10*3/uL (ref 3.4–10.8)

## 2014-04-23 LAB — BASIC METABOLIC PANEL
BUN / CREAT RATIO: 26 (ref 11–26)
BUN: 24 mg/dL (ref 8–27)
CALCIUM: 9 mg/dL (ref 8.7–10.3)
CO2: 27 mmol/L (ref 18–29)
CREATININE: 0.91 mg/dL (ref 0.57–1.00)
Chloride: 100 mmol/L (ref 97–108)
GFR calc Af Amer: 71 mL/min/{1.73_m2} (ref >59)
GFR, EST NON AFRICAN AMERICAN: 61 mL/min/{1.73_m2} (ref >59)
Glucose: 83 mg/dL (ref 65–99)
Potassium: 4.5 mmol/L (ref 3.5–5.2)
SODIUM: 140 mmol/L (ref 134–144)

## 2014-04-27 ENCOUNTER — Telehealth: Payer: Self-pay | Admitting: *Deleted

## 2014-04-27 NOTE — Telephone Encounter (Signed)
Patient stated that she is falling and feeling weak. Instructed patient to go to Urgent [facility] Center to be evaluated.

## 2014-04-28 ENCOUNTER — Telehealth: Payer: Self-pay | Admitting: *Deleted

## 2014-04-28 NOTE — Telephone Encounter (Signed)
Patient called and stated that she was having severe back pain going all around her back and sometimes going up through the front. Informed the patient we did not have any appointments here to be seen and for her to go to the Urgent [facility] to be evaluated. She agreed.

## 2014-04-29 DIAGNOSIS — M545 Low back pain, unspecified: Secondary | ICD-10-CM | POA: Diagnosis not present

## 2014-04-30 DIAGNOSIS — F325 Major depressive disorder, single episode, in full remission: Secondary | ICD-10-CM | POA: Diagnosis not present

## 2014-05-01 DIAGNOSIS — M545 Low back pain, unspecified: Secondary | ICD-10-CM | POA: Diagnosis not present

## 2014-05-01 DIAGNOSIS — M542 Cervicalgia: Secondary | ICD-10-CM | POA: Diagnosis not present

## 2014-05-04 ENCOUNTER — Encounter: Payer: Self-pay | Admitting: Internal Medicine

## 2014-05-04 ENCOUNTER — Ambulatory Visit (INDEPENDENT_AMBULATORY_CARE_PROVIDER_SITE_OTHER): Payer: Medicare Other | Admitting: Internal Medicine

## 2014-05-04 VITALS — BP 140/80 | HR 86 | Temp 97.9°F | Resp 20 | Ht 65.0 in | Wt 222.0 lb

## 2014-05-04 DIAGNOSIS — G894 Chronic pain syndrome: Secondary | ICD-10-CM | POA: Diagnosis not present

## 2014-05-04 DIAGNOSIS — N3941 Urge incontinence: Secondary | ICD-10-CM | POA: Diagnosis not present

## 2014-05-04 DIAGNOSIS — IMO0002 Reserved for concepts with insufficient information to code with codable children: Secondary | ICD-10-CM

## 2014-05-04 DIAGNOSIS — E039 Hypothyroidism, unspecified: Secondary | ICD-10-CM

## 2014-05-04 DIAGNOSIS — M5416 Radiculopathy, lumbar region: Secondary | ICD-10-CM

## 2014-05-04 MED ORDER — ARIPIPRAZOLE 5 MG PO TABS: 5.0000 mg | ORAL_TABLET | Freq: Every day | ORAL | Status: DC

## 2014-05-04 NOTE — Progress Notes (Signed)
Patient ID: Andrea [name], female   DOB: 16-Dec-1934, 79 y.o.   MRN: [id number]   Location:  [facility]   Allergies  Allergen Reactions  . Clindamycin Hcl Other (See Comments)    REACTION: swelling, pt. Reports that she had a 16 lb. weightgain in one day    Chief Complaint  Patient presents with  . Follow-up    still having symtoms of UTI, medication is finished    HPI: Patient is a 79 y.o. white female seen in the office today for medical mgt of chronic diseases.  Still having low back pain in left side.  Has spinal stenosis.   Was treated for uti w/o benefit Saw Dr. Luther Redo to PT for 2 wks, then stopped Pain worst when walks up stairs in her home PT has always helped before Using only percocet (0-2 depending on day) and mobic regularly.   No temp or weather relation No imaging of back since last august Had been falling--was due to cervical stenosis with myelopathy.    Sleeping better with trazodone most of the time.  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  Memory worse today.  Also not wearing hearing aides.    Takes about 5 klonopin per week for anxiety.  Review of Systems:  Review of Systems  Constitutional: Positive for malaise/fatigue. Negative for fever and chills.  HENT: Negative for congestion.   Eyes: Negative for blurred vision.  Respiratory: Positive for shortness of breath.   Cardiovascular: Negative for chest pain.  Gastrointestinal: Negative for abdominal pain, constipation, blood in stool and melena.  Genitourinary: Positive for urgency and frequency. Negative for dysuria.       Having urge incontinence  Musculoskeletal: Positive for back pain and myalgias. Negative for falls.  Skin: Negative for rash.  Neurological: Positive for weakness. Negative for dizziness, loss of consciousness and headaches.  Endo/Heme/Allergies:  Bruises/bleeds easily.  Psychiatric/Behavioral: Positive for depression and memory loss. The patient is nervous/anxious and has insomnia.        Sleeping better with trazodone    Past Medical History  Diagnosis Date  . Hypotension   . Depression with anxiety   . Cough     Wert-onset 08/2009, as of 2014- resolved   . Deep venous thrombosis     post op, rec'd/needed  blood thinner   . Cervicalgia   . Hypothyroidism   . Blood transfusion   . Constipation   . Shortness of breath   . Anxiety   . Dysrhythmia   . Anemia   . Depression   . Lumbar radiculopathy 09/26/2011  . Headache(784.0) 09/27/2011  . Pneumonia     never been in hosp. for pneumonia   . Sleep apnea     doesn't use CPAP any longer, since weight loss  . Arthritis     hands, spine   . History of stress test     15-20 yrs.ago   . Falls frequently     pt. reports that she was falling 3 times a day, has had PT at a facility for a while & now is getting home PT 2-3 times/ week   . Right hand fracture     Monday  . Prediabetes     Past Surgical History  Procedure Laterality Date  . Replacement total knee bilateral    . Appendectomy    . Breast excisional biopsy  breast bxs on left x 2, breast bx of right x 1-all benign  . Laparoscopic ovarian cystectomy    . Joint replacement  [id number]    bilateral  . Tonsillectomy    . Eye surgery      cataracts bilateral /w IOL  . Hernia repair      umbilical hernia- [id number]  . Back surgery      2000  . Anterior cervical decomp/discectomy fusion N/A 07/09/2013    Procedure: ANTERIOR CERVICAL DECOMPRESSION/DISCECTOMY FUSION 3 LEVELS;  Surgeon: Sinclair Ship, MD;  Location: [place];  Service: Orthopedics;  Laterality: N/A;  Anterior cervical decompression fusion, cervical 3-4, cervical 4-5-, cervical 5-6 with instrumentation, allograft.    Social History:   reports that she quit smoking about 45 years ago. Her smoking use included Cigarettes. She has a 10 pack-year  smoking history. She has never used smokeless tobacco. She reports that she drinks about .6 ounces of alcohol per week. She reports that she does not use illicit drugs.  Family History  Problem Relation Age of Onset  . Heart disease Father   . Malignant hyperthermia Father   . Arthritis Father   . Deep vein thrombosis Son   . Diabetes Son   . Obesity Son   . Depression Son   . Hypertension Son   . Parkinson's disease Mother   . Diabetes Daughter   . Hypertension Daughter   . Anxiety disorder Daughter   . Cancer Maternal Aunt   . Hyperlipidemia Son   . Hypertension Son     Medications: Patient's Medications  New Prescriptions   No medications on file  Previous Medications   ALBUTEROL (PROVENTIL HFA;VENTOLIN HFA) 108 (90 BASE) MCG/ACT INHALER    Inhale 2 puffs into the lungs every 6 (six) hours as needed for wheezing or shortness of breath.   CALCIUM CARBONATE (TUMS EX) 750 MG CHEWABLE TABLET    Chew 1 tablet by mouth daily.   CLONAZEPAM (KLONOPIN) 0.5 MG TABLET    Take 0.5 mg by mouth as needed for anxiety.    DULOXETINE HCL (CYMBALTA PO)    Take by mouth. 2 by mouth daily, Patient not sure of dose, RX'ed by specialist   LEVOTHYROXINE (SYNTHROID, LEVOTHROID) 50 MCG TABLET    Take 50 mcg by mouth every morning.    MELOXICAM (MOBIC) 7.5 MG TABLET    Take 1 tablet (7.5 mg total) by mouth daily.   MULTIPLE VITAMINS-MINERALS (MULTIVITAMIN PO)    Take 1 tablet by mouth.   OXYCODONE-ACETAMINOPHEN (PERCOCET) 10-325 MG PER TABLET    Take 1-2 tablets by mouth every 4-6 hours as needed for Arthritis pain   TRAZODONE (DESYREL) 300 MG TABLET    Take 300 mg by mouth at bedtime.   WARFARIN (COUMADIN) 5 MG TABLET    Take 1 tablet daily except 1/2 tablet on Tuesdays and Fridays  Modified Medications   No medications on file  Discontinued Medications   AMOXICILLIN-CLAVULANATE (AUGMENTIN) 875-125 MG PER TABLET    Take 1 tablet by mouth 2 (two) times daily.     Physical Exam: Filed Vitals:    05/04/14 1144  BP: 140/80  Pulse: 86  Temp: 97.9 F (36.6 C)  TempSrc: Oral  Resp: 20  Height: 5\' 5"  (1.651 m)  Weight: 222 lb (100.699 kg)  SpO2: 96%  Physical Exam  Constitutional: She appears well-developed and well-nourished. No distress.  Obese white female walks with rollator walker  Cardiovascular: Normal rate, regular rhythm, normal heart sounds and  intact distal pulses.   Pulmonary/Chest: Effort normal and breath sounds normal. She has no wheezes. She has no rales.  Abdominal: Soft. Bowel sounds are normal. She exhibits no distension and no mass. There is no tenderness.  Neurological: She is alert.  Repeating herself today, confused, got up before visit was over to leave  Skin: Skin is warm and dry.  Psychiatric:  Tearful during visit "because nobody knows what exactly is wrong with my back an no one is doing anything about it"    Labs reviewed: Basic Metabolic Panel:  Recent Labs  11/23/13 1057 02/12/14 1122 04/22/14 1135  NA 138 142 140  K 4.0 4.4 4.5  CL 101 102 100  CO2 26 24 27   GLUCOSE 112* 86 83  BUN 23 26 24   CREATININE 0.79 0.84 0.91  CALCIUM 8.6 8.9 9.0  TSH  --  1.980  --    Liver Function Tests:  Recent Labs  07/03/13 0918 02/12/14 1122  AST 16 23  ALT 9 15  ALKPHOS 114 85  BILITOT 0.3 <0.2  PROT 6.4 6.4  ALBUMIN 3.6  --   CBC:  Recent Labs  11/23/13 1057 02/12/14 1122 04/22/14 1135  WBC 6.7 5.6 6.0  NEUTROABS  --  3.2 4.2  HGB 12.4 12.4 12.2  HCT 37.5 37.4 37.7  MCV 83.7 81 84  PLT 251 304 195   Lipid Panel:  Recent Labs  02/12/14 1122  HDL 49  LDLCALC 124*  TRIG 131  CHOLHDL 4.1   Lab Results  Component Value Date   HGBA1C 6.0* 02/12/2014   Assessment/Plan 1. Lumbar radiculopathy -has known h/o lumbar spinal stenosis and tells me she had two surgeries on her back--these are not specified in her history from her prior pcp and she did not give [place] this info in her new pt packet -I have asked her to find out the  name of her surgeon, location, phone, address to get records  -also sees Dr. Lynann Bologna so I have had her complete a record release from him--sounds like he has imaged her entire spine -continues on percocet and cymbalta -already has appt for injection at Dr. Laurena Bering as her physical therapy "was unsuccessful" 2. Chronic pain syndrome -seems this is closely linked to her mood--sees a psychotherapist who manages her meds related to her mood 3. Obesity, morbid -is unable to lose weight due to pain, inactivity, is sob from deconditioning 4. Urge incontinence -is new problem--happening all of the time -was just treated for UTI but it has not improved -?related to her back -does not have any other red flags -I do not know what she has in her back as far as hardware so I don't know if she can have an MRI done 5. Hypothyroidism -cont synthroid--asked about taking the other type of thyroid replacement--explained that this is not ideal and she should continue what she is taking as her levels are therapeutic  WILL PLAN ON MRI OF THORACIC AND LUMBAR SPINE IF PATIENT CAN HAVE THIS AND HAS NOT ALREADY HAD THIS RECENTLY.  Next appt:  6 weeks to f/u on back

## 2014-05-11 ENCOUNTER — Encounter: Payer: Self-pay | Admitting: Pharmacotherapy

## 2014-05-11 ENCOUNTER — Ambulatory Visit (INDEPENDENT_AMBULATORY_CARE_PROVIDER_SITE_OTHER): Payer: Medicare Other | Admitting: Pharmacotherapy

## 2014-05-11 VITALS — BP 150/82 | HR 68 | Temp 98.4°F | Wt 221.0 lb

## 2014-05-11 DIAGNOSIS — I82409 Acute embolism and thrombosis of unspecified deep veins of unspecified lower extremity: Secondary | ICD-10-CM | POA: Diagnosis not present

## 2014-05-11 DIAGNOSIS — Z7901 Long term (current) use of anticoagulants: Secondary | ICD-10-CM

## 2014-05-11 LAB — POCT INR: INR: 3.3

## 2014-05-11 MED ORDER — WARFARIN SODIUM 5 MG PO TABS: ORAL_TABLET | ORAL | Status: DC

## 2014-05-11 NOTE — Progress Notes (Signed)
Subjective:     Patient ID: Andrea [name], female   DOB: [birth year], 79 y.o.   MRN: [id number]  HPI Last INR was OK at 2.3 Current coumadin dose is 5mg  QD except 2.5mg  T/F Denies missed doses. Denies unusual bleeding or bruising. Has started Abilify since last OV. Consistent with vitamin K intake  Review of Systems  HENT: Negative for nosebleeds.   Respiratory: Negative for shortness of breath.   Cardiovascular: Negative for chest pain.  Gastrointestinal: Negative for blood in stool and anal bleeding.  Genitourinary: Negative for hematuria.  Hematological: Does not bruise/bleed easily.       Objective:   Physical Exam  Constitutional: She is oriented to person, place, and time. She appears well-developed and well-nourished.  HENT:  Right Ear: External ear normal.  Left Ear: External ear normal.  Eyes: Pupils are equal, round, and reactive to light.  Cardiovascular: Normal rate, regular rhythm and normal heart sounds.   Pulmonary/Chest: Effort normal and breath sounds normal.  Musculoskeletal:  Uses a walker  Neurological: She is alert and oriented to person, place, and time.  Skin: Skin is warm and dry.  Psychiatric: She has a normal mood and affect. Her behavior is normal.    BP:  150/82  HR:  68  Wt:  221 lb     Assessment:     INR 3.3     Plan:     1.  INR above goal 2-3 2.  Decrease Coumadin 5mg  QD except 2.5mg  MWF 3.  RTC in 3 weeks

## 2014-05-11 NOTE — Patient Instructions (Signed)
INR 3.3 - too high. Decrease Coumadin 5mg  daily except 2.5mg  (1/2 tablet) on Mondays, Wednesdays, and Fridays

## 2014-05-14 ENCOUNTER — Encounter (HOSPITAL_COMMUNITY): Payer: Self-pay | Admitting: Emergency Medicine

## 2014-05-14 ENCOUNTER — Emergency Department (HOSPITAL_COMMUNITY)
Admission: EM | Admit: 2014-05-14 | Discharge: 2014-05-14 | Discharge status: Against Medical Advice | Payer: Medicare Other | Attending: Emergency Medicine | Admitting: Emergency Medicine

## 2014-05-14 ENCOUNTER — Emergency Department (INDEPENDENT_AMBULATORY_CARE_PROVIDER_SITE_OTHER)
Admission: EM | Admit: 2014-05-14 | Discharge: 2014-05-14 | Disposition: A | Discharge status: Nursing Home (Includes ICF) | Payer: Medicare Other | Source: Home / Self Care

## 2014-05-14 DIAGNOSIS — M79609 Pain in unspecified limb: Secondary | ICD-10-CM | POA: Diagnosis not present

## 2014-05-14 DIAGNOSIS — R0602 Shortness of breath: Secondary | ICD-10-CM | POA: Diagnosis not present

## 2014-05-14 DIAGNOSIS — Z87891 Personal history of nicotine dependence: Secondary | ICD-10-CM | POA: Insufficient documentation

## 2014-05-14 DIAGNOSIS — I10 Essential (primary) hypertension: Secondary | ICD-10-CM | POA: Insufficient documentation

## 2014-05-14 DIAGNOSIS — I82409 Acute embolism and thrombosis of unspecified deep veins of unspecified lower extremity: Secondary | ICD-10-CM | POA: Insufficient documentation

## 2014-05-14 DIAGNOSIS — M79604 Pain in right leg: Secondary | ICD-10-CM

## 2014-05-14 NOTE — ED Notes (Signed)
Patient called x2 at triage with no answer

## 2014-05-14 NOTE — Progress Notes (Signed)
VASCULAR LAB PRELIMINARY  PRELIMINARY  PRELIMINARY  PRELIMINARY  Right lower extremity venous Doppler completed.    Preliminary report:  There is no DVT or SVT noted in the right lower extremity.   KANADY, CANDACE, RVT 05/14/2014, 7:21 PM

## 2014-05-14 NOTE — ED Provider Notes (Signed)
CSN: [id number]     Arrival date & time 05/14/14  1655 History   None    No chief complaint on file.  (Consider location/radiation/quality/duration/timing/severity/associated sxs/prior Treatment) HPI Comments: 79 yo WF with hx of 4 DVT's in the past with most recent winter of 2014. She is currently on Coumadin. She has not been evaluated for clotting disorder. She notes she has laso had some SOB on and off and a dry harsh cough. She notes she has a little more SOB if she over exerts. She started having right LE pain today after straining with BM in the same place as her last DVT. She notes + increased swelling and pain over last several hours.   Past Medical History  Diagnosis Date  . Hypotension   . Depression with anxiety   . Cough     Wert-onset 08/2009, as of 2014- resolved   . Deep venous thrombosis     post op, rec'd/needed  blood thinner   . Cervicalgia   . Hypothyroidism   . Blood transfusion   . Constipation   . Shortness of breath   . Anxiety   . Dysrhythmia   . Anemia   . Depression   . Lumbar radiculopathy 09/26/2011  . Headache(784.0) 09/27/2011  . Pneumonia     never been in hosp. for pneumonia   . Sleep apnea     doesn't use CPAP any longer, since weight loss  . Arthritis     hands, spine   . History of stress test     15-20 yrs.ago   . Falls frequently     pt. reports that she was falling 3 times a day, has had PT at a facility for a while & now is getting home PT 2-3 times/ week   . Right hand fracture     Monday  . Prediabetes    Past Surgical History  Procedure Laterality Date  . Replacement total knee bilateral    . Appendectomy    . Breast excisional biopsy      breast bxs on left x 2, breast bx of right x 1-all benign  . Laparoscopic ovarian cystectomy    . Joint replacement  [id number]    bilateral  . Tonsillectomy    . Eye surgery      cataracts bilateral /w IOL  . Hernia repair      umbilical hernia- [id number]  . Back surgery      2000  .  Anterior cervical decomp/discectomy fusion N/A 07/09/2013    Procedure: ANTERIOR CERVICAL DECOMPRESSION/DISCECTOMY FUSION 3 LEVELS;  Surgeon: Sinclair Ship, MD;  Location: [facility];  Service: Orthopedics;  Laterality: N/A;  Anterior cervical decompression fusion, cervical 3-4, cervical 4-5-, cervical 5-6 with instrumentation, allograft.   Family History  Problem Relation Age of Onset  . Heart disease Father   . Malignant hyperthermia Father   . Arthritis Father   . Deep vein thrombosis Son   . Diabetes Son   . Obesity Son   . Depression Son   . Hypertension Son   . Parkinson's disease Mother   . Diabetes Daughter   . Hypertension Daughter   . Anxiety disorder Daughter   . Cancer Maternal Aunt   . Hyperlipidemia Son   . Hypertension Son    History  Substance Use Topics  . Smoking status: Former Smoker -- 0.50 packs/day for 20 years    Types: Cigarettes    Quit date: 11/20/1968  . Smokeless tobacco: Never Used  Comment: smoked for approx. 10 years  . Alcohol Use: 0.6 oz/week    1 Glasses of wine per week     Comment: rare   OB History   Grav Para Term Preterm Abortions TAB SAB Ect Mult Living                 Review of Systems  Respiratory: Positive for cough and shortness of breath.   Cardiovascular: Positive for leg swelling.  All other systems reviewed and are negative.   Allergies  Clindamycin hcl  Home Medications   Prior to Admission medications   Medication Sig Start Date End Date Taking? Authorizing Delio Slates  albuterol (PROVENTIL HFA;VENTOLIN HFA) 108 (90 BASE) MCG/ACT inhaler Inhale 2 puffs into the lungs every 6 (six) hours as needed for wheezing or shortness of breath. 09/26/13   Gregor Hams, MD  ARIPiprazole (ABILIFY) 5 MG tablet Take 1 tablet (5 mg total) by mouth daily. 05/04/14   Tiffany L Reed, DO  calcium carbonate (TUMS EX) 750 MG chewable tablet Chew 1 tablet by mouth daily.    Historical Shiesha Jahn, MD  clonazePAM (KLONOPIN) 0.5 MG tablet Take  0.5 mg by mouth as needed for anxiety.     Historical Mamye Bolds, MD  DULoxetine HCl (CYMBALTA PO) Take by mouth. 2 by mouth daily, Patient not sure of dose, RX'ed by specialist    Historical Marykathryn Carboni, MD  levothyroxine (SYNTHROID, LEVOTHROID) 50 MCG tablet Take 50 mcg by mouth every morning.     Historical Yahir Tavano, MD  meloxicam (MOBIC) 7.5 MG tablet Take 1 tablet (7.5 mg total) by mouth daily. 11/03/13   Tanda Rockers, MD  Multiple Vitamins-Minerals (MULTIVITAMIN PO) Take 1 tablet by mouth.    Historical Camren Lipsett, MD  oxyCODONE-acetaminophen (PERCOCET) 10-325 MG per tablet Take 1-2 tablets by mouth every 4-6 hours as needed for Arthritis pain 04/21/14   Estill Dooms, MD  trazodone (DESYREL) 300 MG tablet Take 300 mg by mouth at bedtime.    Historical Carin Shipp, MD  warfarin (COUMADIN) 5 MG tablet Take 1 tablet daily except 1/2 tablet on Mondays, Wednesdays and Fridays 05/11/14   Tivis Ringer, RPH-CPP   BP 134/83  Pulse 73  Temp(Src) 99.2 F (37.3 C) (Oral)  Resp 16  SpO2 97% Physical Exam  Nursing note and vitals reviewed. Constitutional: She is oriented to person, place, and time. She appears well-developed and well-nourished.  HENT:  Head: Normocephalic and atraumatic.  Right Ear: External ear normal.  Left Ear: External ear normal.  Nose: Nose normal.  Eyes: Conjunctivae and EOM are normal.  Neck: Normal range of motion.  Cardiovascular: Normal rate, regular rhythm, normal heart sounds and intact distal pulses.   1 + right LE edema  Pulmonary/Chest: Effort normal and breath sounds normal.  Musculoskeletal: Normal range of motion. She exhibits tenderness.  Right lateral mid to distal calf. Walks with walker  Lymphadenopathy:    She has no cervical adenopathy.  Neurological: She is alert and oriented to person, place, and time.  Skin: Skin is warm and dry.  Psychiatric: She has a normal mood and affect. Judgment normal.    ED Course  Procedures (including critical care  time) Labs Review Labs Reviewed - No data to display  Imaging Review No results found.   MDM  Multiple DVT + HX on Coumadin with new onset of pain right LE and SOB with exertion- Transfer ER for further evaluation with concerns with multiple DVTs but new onset SOB/ cough    Melissa  [place], PA-C 05/14/14 1753

## 2014-05-14 NOTE — ED Notes (Signed)
Pt from [facility] with hx of DVT. On coumadin already. Has pain and swelling to right calf area.

## 2014-05-14 NOTE — ED Notes (Signed)
Unable to locate pt after multiple attempts.

## 2014-05-14 NOTE — ED Notes (Signed)
C/o R lower leg pain and swelling onset after lunch.  Hx. Blood clots in her legs-on coumadin.  C/o SOB when she walks.

## 2014-05-14 NOTE — ED Notes (Signed)
Patient called at triage x2 with no answer

## 2014-05-15 NOTE — ED Provider Notes (Signed)
Medical screening examination/treatment/procedure(s) were performed by a resident physician or non-physician practitioner and as the supervising physician I was immediately available for consultation/collaboration.  Lynne Leader, MD    Gregor Hams, MD 05/15/14 [phone]

## 2014-05-18 ENCOUNTER — Other Ambulatory Visit: Payer: Self-pay | Admitting: *Deleted

## 2014-05-18 MED ORDER — LEVOTHYROXINE SODIUM 50 MCG PO TABS: 50.0000 ug | ORAL_TABLET | Freq: Every morning | ORAL | Status: DC

## 2014-05-18 NOTE — Telephone Encounter (Signed)
Patient Requested 

## 2014-05-25 ENCOUNTER — Encounter: Payer: Self-pay | Admitting: Pharmacotherapy

## 2014-05-25 ENCOUNTER — Ambulatory Visit (INDEPENDENT_AMBULATORY_CARE_PROVIDER_SITE_OTHER): Payer: Medicare Other | Admitting: Pharmacotherapy

## 2014-05-25 VITALS — BP 130/74 | HR 75 | Resp 10 | Wt 220.0 lb

## 2014-05-25 DIAGNOSIS — Z7901 Long term (current) use of anticoagulants: Secondary | ICD-10-CM

## 2014-05-25 DIAGNOSIS — I82409 Acute embolism and thrombosis of unspecified deep veins of unspecified lower extremity: Secondary | ICD-10-CM | POA: Diagnosis not present

## 2014-05-25 DIAGNOSIS — M545 Low back pain, unspecified: Secondary | ICD-10-CM | POA: Diagnosis not present

## 2014-05-25 LAB — POCT INR: INR: 1.4

## 2014-05-25 MED ORDER — WARFARIN SODIUM 5 MG PO TABS: ORAL_TABLET | ORAL | Status: DC

## 2014-05-25 NOTE — Patient Instructions (Signed)
INR 1.4 Increase Coumadin 5mg  (1 tablet) daily except 2.5mg  (1/2 tablet) on Wednesdays and Fridays

## 2014-05-25 NOTE — Progress Notes (Signed)
Subjective:     Patient ID: Andrea [name], female   DOB: [birth year], 79 y.o.   MRN: [id number]  HPI Last INR was high at 3.3 Current Coumadin dose is 5mg  QD except 2.5mg  MWF Denies missed doses. Denies CP, SOB, falls Denies unusual bleeding or bruising. Has been eating more vitamin K foods.   Review of Systems  HENT: Negative for nosebleeds.   Respiratory: Negative for shortness of breath.   Cardiovascular: Positive for leg swelling. Negative for chest pain.  Gastrointestinal: Negative for blood in stool and anal bleeding.  Genitourinary: Negative for hematuria.  Hematological: Does not bruise/bleed easily.       Objective:   Physical Exam  Constitutional: She is oriented to person, place, and time. She appears well-developed and well-nourished.  HENT:  Right Ear: External ear normal.  Left Ear: External ear normal.  Cardiovascular: Normal rate, regular rhythm and normal heart sounds.   Pulmonary/Chest: Effort normal and breath sounds normal.  Neurological: She is alert and oriented to person, place, and time.  Skin: Skin is warm and dry.  Psychiatric: She has a normal mood and affect. Her behavior is normal.    BP:  130/74   HR:  75   Wt:  220lb     Assessment:     INR 1.4     Plan:     1.  INR below goal 2-3 2.  Increase Coumadin 5mg  QD except 2.5mg  W/F 3.  INR 2 weeks

## 2014-05-28 DIAGNOSIS — F325 Major depressive disorder, single episode, in full remission: Secondary | ICD-10-CM | POA: Diagnosis not present

## 2014-06-08 ENCOUNTER — Ambulatory Visit (INDEPENDENT_AMBULATORY_CARE_PROVIDER_SITE_OTHER): Payer: Medicare Other | Admitting: Pharmacotherapy

## 2014-06-08 ENCOUNTER — Telehealth: Payer: Self-pay | Admitting: *Deleted

## 2014-06-08 ENCOUNTER — Encounter: Payer: Self-pay | Admitting: Pharmacotherapy

## 2014-06-08 VITALS — BP 124/82 | HR 81 | Resp 10 | Wt 220.0 lb

## 2014-06-08 DIAGNOSIS — Z7901 Long term (current) use of anticoagulants: Secondary | ICD-10-CM | POA: Diagnosis not present

## 2014-06-08 DIAGNOSIS — I82409 Acute embolism and thrombosis of unspecified deep veins of unspecified lower extremity: Secondary | ICD-10-CM | POA: Diagnosis not present

## 2014-06-08 DIAGNOSIS — M810 Age-related osteoporosis without current pathological fracture: Secondary | ICD-10-CM | POA: Diagnosis not present

## 2014-06-08 LAB — POCT INR: INR: 2.6

## 2014-06-08 NOTE — Telephone Encounter (Signed)
Patient called wanting an order for a CPAP machine. Patient called the previous Dr. But unable to get records. States she is just going to have to redo all over again. Patient has an appointment on Wednesday to discuss this at that time also.

## 2014-06-08 NOTE — Progress Notes (Signed)
Subjective:     Patient ID: Andrea [name], female   DOB: November 19, 1935, 79 y.o.   MRN: [id number]  HPI Last INR was low at 1.4 Coumadin was increased to 5mg  QD except 2.5mg  W/F. Denies missed doses. Denies unusual bleeding or bruising. Denies CP, but thinks she is having palpitations,  No SOB Consistent with vitamin K intake.   Review of Systems  HENT: Negative for nosebleeds.   Respiratory: Negative for shortness of breath.   Cardiovascular: Positive for palpitations. Negative for chest pain.  Gastrointestinal: Negative for blood in stool and anal bleeding.  Genitourinary: Negative for hematuria.       Objective:   Physical Exam  Constitutional: She is oriented to person, place, and time. She appears well-developed and well-nourished.  HENT:  Right Ear: External ear normal.  Left Ear: External ear normal.  Cardiovascular: Normal rate, regular rhythm and normal heart sounds.   Pulmonary/Chest: Effort normal and breath sounds normal.  Neurological: She is alert and oriented to person, place, and time.  Skin: Skin is warm and dry.  Psychiatric: She has a normal mood and affect. Her behavior is normal.    BP:  124/82  HR:  81  Wt:  220lb     Assessment:     INR 2.6      Plan:     1.  INR at goal 2-3 2.  Continue Coumadin 5mg  QD except 2.5mg  W/F 3.  Repeat INR in 1 month

## 2014-06-08 NOTE — Patient Instructions (Signed)
INR 2.6 Continue Coumadin 5mg  (1 tablet) and 2.5mg  (1/2 tablet) on Wednesdays and Fridays

## 2014-06-09 ENCOUNTER — Encounter: Payer: Self-pay | Admitting: *Deleted

## 2014-06-10 ENCOUNTER — Encounter: Payer: Self-pay | Admitting: Internal Medicine

## 2014-06-10 ENCOUNTER — Ambulatory Visit (INDEPENDENT_AMBULATORY_CARE_PROVIDER_SITE_OTHER): Payer: Medicare Other | Admitting: Internal Medicine

## 2014-06-10 VITALS — BP 132/80 | HR 75 | Resp 10 | Wt 220.0 lb

## 2014-06-10 DIAGNOSIS — G4733 Obstructive sleep apnea (adult) (pediatric): Secondary | ICD-10-CM

## 2014-06-10 DIAGNOSIS — F341 Dysthymic disorder: Secondary | ICD-10-CM

## 2014-06-10 DIAGNOSIS — R32 Unspecified urinary incontinence: Secondary | ICD-10-CM | POA: Insufficient documentation

## 2014-06-10 DIAGNOSIS — N390 Urinary tract infection, site not specified: Secondary | ICD-10-CM

## 2014-06-10 DIAGNOSIS — M419 Scoliosis, unspecified: Secondary | ICD-10-CM

## 2014-06-10 DIAGNOSIS — M5380 Other specified dorsopathies, site unspecified: Secondary | ICD-10-CM

## 2014-06-10 DIAGNOSIS — M62830 Muscle spasm of back: Secondary | ICD-10-CM

## 2014-06-10 DIAGNOSIS — R0989 Other specified symptoms and signs involving the circulatory and respiratory systems: Secondary | ICD-10-CM

## 2014-06-10 DIAGNOSIS — M4120 Other idiopathic scoliosis, site unspecified: Secondary | ICD-10-CM

## 2014-06-10 DIAGNOSIS — R002 Palpitations: Secondary | ICD-10-CM

## 2014-06-10 DIAGNOSIS — R0609 Other forms of dyspnea: Secondary | ICD-10-CM

## 2014-06-10 NOTE — Patient Instructions (Addendum)
Continue current medications. Sleep study at home will be arranged.
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
## 2014-06-11 ENCOUNTER — Other Ambulatory Visit: Payer: Medicare Other

## 2014-06-11 DIAGNOSIS — N390 Urinary tract infection, site not specified: Secondary | ICD-10-CM | POA: Diagnosis not present

## 2014-06-12 LAB — URINALYSIS
Bilirubin, UA: NEGATIVE
Glucose, UA: NEGATIVE
Ketones, UA: NEGATIVE
Nitrite, UA: NEGATIVE
PH UA: 6 (ref 5.0–7.5)
Protein, UA: NEGATIVE
RBC, UA: NEGATIVE
SPEC GRAV UA: 1.019 (ref 1.005–1.030)
UUROB: 0.2 mg/dL (ref 0.0–1.9)

## 2014-06-13 LAB — URINE CULTURE

## 2014-06-15 ENCOUNTER — Ambulatory Visit: Payer: Medicare Other | Admitting: Internal Medicine

## 2014-06-19 ENCOUNTER — Ambulatory Visit (INDEPENDENT_AMBULATORY_CARE_PROVIDER_SITE_OTHER): Payer: Medicare Other | Admitting: Neurology

## 2014-06-19 ENCOUNTER — Telehealth: Payer: Self-pay | Admitting: *Deleted

## 2014-06-19 ENCOUNTER — Encounter: Payer: Self-pay | Admitting: Neurology

## 2014-06-19 VITALS — BP 127/77 | HR 72 | Resp 16 | Wt 222.0 lb

## 2014-06-19 DIAGNOSIS — G4733 Obstructive sleep apnea (adult) (pediatric): Secondary | ICD-10-CM | POA: Diagnosis not present

## 2014-06-19 DIAGNOSIS — F409 Phobic anxiety disorder, unspecified: Secondary | ICD-10-CM

## 2014-06-19 DIAGNOSIS — G4730 Sleep apnea, unspecified: Secondary | ICD-10-CM | POA: Diagnosis not present

## 2014-06-19 DIAGNOSIS — R351 Nocturia: Secondary | ICD-10-CM

## 2014-06-19 DIAGNOSIS — G4710 Hypersomnia, unspecified: Secondary | ICD-10-CM | POA: Diagnosis not present

## 2014-06-19 DIAGNOSIS — R0989 Other specified symptoms and signs involving the circulatory and respiratory systems: Secondary | ICD-10-CM | POA: Diagnosis not present

## 2014-06-19 DIAGNOSIS — R0609 Other forms of dyspnea: Secondary | ICD-10-CM

## 2014-06-19 DIAGNOSIS — I4891 Unspecified atrial fibrillation: Secondary | ICD-10-CM

## 2014-06-19 DIAGNOSIS — R0683 Snoring: Secondary | ICD-10-CM

## 2014-06-19 DIAGNOSIS — F5105 Insomnia due to other mental disorder: Secondary | ICD-10-CM

## 2014-06-19 DIAGNOSIS — F489 Nonpsychotic mental disorder, unspecified: Secondary | ICD-10-CM

## 2014-06-19 MED ORDER — FUROSEMIDE 40 MG PO TABS: 40.0000 mg | ORAL_TABLET | Freq: Every day | ORAL | Status: DC

## 2014-06-19 NOTE — Patient Instructions (Signed)
Polysomnography (Sleep Studies) Polysomnography (PSG) is a series of tests used for detecting (diagnosing) obstructive sleep apnea and other sleep disorders. The tests measure how some parts of your body are working while you are sleeping. The tests are extensive and expensive. They are done in a sleep lab or hospital, and vary from center to center. Your caregiver may perform other more simple sleep studies and questionnaires before doing more complete and involved testing. Testing may not be covered by insurance. Some of these tests are:  An EEG (Electroencephalogram). This tests your brain waves and stages of sleep.  An EOG (Electrooculogram). This measures the movements of your eyes. It detects periods of REM (rapid eye movement) sleep, which is your dream sleep.  An EKG (Electrocardiogram). This measures your heart rhythm.  EMG (Electromyography). This is a measurement of how the muscles are working in your upper airway and your legs while sleeping.  An oximetry measurement. It measures how much oxygen (air) you are getting while sleeping.  Breathing efforts may be measured. The same test can be interpreted (understood) differently by different caregivers and centers that study sleep.  Studies may be given an apnea/hypopnea index (AHI). This is a number which is found by counting the times of no breathing or under breathing during the night, and relating those numbers to the amount of time spent in bed. When the AHI is greater than 15, the patient is likely to complain of daytime sleepiness. When the AHI is greater than 30, the patient is at increased risk for heart problems and must be followed more closely. Following the AHI also allows you to know how treatment is working. Simple oximetry (tracking the amount of oxygen that is taken in) can be used for screening patients who:  Do not have symptoms (problems) of OSA.  Have a normal Epworth Sleepiness Scale Score.  Have a low pre-test  probability of having OSA.  Have none of the upper airway problems likely to cause apnea.  Oximetry is also used to determine if treatment is effective in patients who showed significant desaturations (not getting enough oxygen) on their home sleep study. One extra measure of safety is to perform additional studies for the person who only snores. This is because no one can predict with absolute certainty who will have OSA. Those who show significant desaturations (not getting enough oxygen) are recommended to have a more detailed sleep study. Document Released: 05/13/2003 Document Revised: 01/29/2012 Document Reviewed: 01/12/2014 Exit[employer] Patient Information 2015 [employer]. This information is not intended to replace advice given to you by your health care provider. Make sure you discuss any questions you have with your health care provider.  

## 2014-06-19 NOTE — Telephone Encounter (Signed)
RX sent to pharmacy, patient's caregiver informed of Dr.Reed's response

## 2014-06-19 NOTE — Telephone Encounter (Signed)
Yes, but she should make sure she takes in plenty of potassium if she has to use the lasix--for example, eating a banana that day.

## 2014-06-19 NOTE — Telephone Encounter (Signed)
Pt's daughter [name] is calling and asking for a refill on Furosemide 40 mg to take as needed for feet swelling.  This was filled by previous PCP Dr Irven Shelling.  Is this okay to fill? Please advise. Thanks, [employer]

## 2014-06-19 NOTE — Progress Notes (Signed)
[facility]  Provider:  Larey Seat, M D  Referring Provider: Estill Dooms, MD Primary Care Physician:  Hollace Kinnier, DO  Chief Complaint  Patient presents with  . New Evaluation    Room 10  . Sleep consult    HPI:  Andrea [name] is a 79 y.o.caucasian  female, who is seen here as a referral  from Dr.  Charolette Forward for a sleep consult.   Andrea [name] is a newly  referred Patient from  Dr. Nyoka Cowden, seen for a sleep evaluation. She is a retired Science writer. She underwent a cervical fusion last year. She had multiple falls. Arthritis.   She has a history of deep venous thrombosis and is taking Coumadin, she had a history of urinary incontinence-urinary tract infections and lower back spasms she is treated for depression on Abilify. She is awaiting a cardiology workup for suspicion of ventricular arrhythmia.  A referral to [place] today as based on the patient's history of documented obstructive sleep apnea, also all copies of her previous sleep studies or the settings on her machine are not available.  The patient was diagnosed approximately 15 years ago with sleep apnea for the very first time and received a second CPAP machine about 5-7 years after the first. When she moved to [place] 5 years ago, parts of her CPAP set up were  lost and she has not used CPAP since. Doors, the patient and her daughter confirmed that he used to be compliant with CPAP and actually felt a great benefit from using it.   The patient endorsed today the geriatric depression scale at 12 points, the Epworth Sleepiness Scale at 9 points and her fatigue severity at 49 points.  Current sleep habits:  She goes to bed by 8 PM  and reads, plays solitaire and falls asleep with great delay, often 45-60 minutes. The bathroom breaks wakes her between 12 and 3 AM multiple times . She is incontinent. She wakes up without alarm, 6 AM.  She feels neither rested nor restored and a  very dry mouth bothers her, She feels depressed, rare headaches in AM. Her overall sleep time is 7 hours.  She naps on and off thoughout the day.  She goes to bed for her naps, these are not sleep attacks.   her cervical fusion helped her sleep a little bit, less restless legs. She has been drinking coffee , 2-4 cups, iced coke - 2 a day.  She has been drinking this with all meals.     Review of Systems: Out of a complete 14 system review, the patient complains of only the following symptoms, and all other reviewed systems are negative. Sleepiness with all days naps. Snoring, waking up choking , some morning headaches.   History   Social History  . Marital Status: Divorced    Spouse Name: N/A    Number of Children: 3  . Years of Education: College   Occupational History  . Retired Therapist, sports    Social History Main Topics  . Smoking status: Former Smoker -- 0.50 packs/day for 20 years    Types: Cigarettes    Quit date: 11/20/1968  . Smokeless tobacco: Never Used     Comment: smoked for approx. 10 years  . Alcohol Use: 0.6 oz/week    1 Glasses of wine per week     Comment: rare  . Drug Use: No  . Sexual Activity: No   Other Topics Concern  .  Not on file   Social History Narrative   Patient is single and lives alone.   Patient is retired.   Patient has a college education.   Patient has three adult children   Patient is right-handed.   Patient drinks 3-4 cups of caffeine (soda and coffee) daily.    Family History  Problem Relation Age of Onset  . Heart disease Father   . Malignant hyperthermia Father   . Arthritis Father   . Deep vein thrombosis Son   . Diabetes Son   . Obesity Son   . Depression Son   . Hypertension Son   . Parkinson's disease Mother   . Diabetes Daughter   . Hypertension Daughter   . Anxiety disorder Daughter   . Cancer Maternal Aunt   . Hyperlipidemia Son   . Hypertension Son     Past Medical History  Diagnosis Date  . Hypotension   .  Depression with anxiety   . Cough     Wert-onset 08/2009, as of 2014- resolved   . Deep venous thrombosis     post op, rec'd/needed  blood thinner   . Cervicalgia   . Hypothyroidism   . Blood transfusion   . Constipation   . Shortness of breath   . Anxiety   . Dysrhythmia   . Anemia   . Depression   . Lumbar radiculopathy 09/26/2011  . Headache(784.0) 09/27/2011  . Pneumonia     never been in hosp. for pneumonia   . Sleep apnea     doesn't use CPAP any longer, since weight loss  . Arthritis     hands, spine   . History of stress test     15-20 yrs.ago   . Falls frequently     pt. reports that she was falling 3 times a day, has had PT at a facility for a while & now is getting home PT 2-3 times/ week   . Right hand fracture     Monday  . Prediabetes     Past Surgical History  Procedure Laterality Date  . Replacement total knee bilateral    . Appendectomy    . Breast excisional biopsy      breast bxs on left x 2, breast bx of right x 1-all benign  . Laparoscopic ovarian cystectomy      not done by laparscopy-abdominal incision  . Joint replacement  [id number]    bilateral  . Tonsillectomy    . Eye surgery      cataracts bilateral /w IOL  . Hernia repair      umbilical hernia- 0240  . Back surgery      2000  . Anterior cervical decomp/discectomy fusion N/A 07/09/2013    Procedure: ANTERIOR CERVICAL DECOMPRESSION/DISCECTOMY FUSION 3 LEVELS;  Surgeon: Sinclair Ship, MD;  Location: [facility];  Service: Orthopedics;  Laterality: N/A;  Anterior cervical decompression fusion, cervical 3-4, cervical 4-5-, cervical 5-6 with instrumentation, allograft.    Current Outpatient Prescriptions  Medication Sig Dispense Refill  . albuterol (PROVENTIL HFA;VENTOLIN HFA) 108 (90 BASE) MCG/ACT inhaler Inhale 2 puffs into the lungs every 6 (six) hours as needed for wheezing or shortness of breath.  1 Inhaler  2  . ARIPiprazole (ABILIFY) 5 MG tablet Take 1 tablet (5 mg total) by mouth  daily.  30 tablet  3  . CALCIUM CITRATE PO Take 600 mg by mouth 2 (two) times daily.      . clonazePAM (KLONOPIN) 0.5 MG tablet Take 0.5  mg by mouth as needed for anxiety.       . DULoxetine HCl (CYMBALTA PO) Take by mouth. 2 by mouth daily, Patient not sure of dose, RX'ed by specialist      . levothyroxine (SYNTHROID, LEVOTHROID) 50 MCG tablet Take 1 tablet (50 mcg total) by mouth every morning.  30 tablet  3  . meloxicam (MOBIC) 7.5 MG tablet Take 1 tablet (7.5 mg total) by mouth daily.  30 tablet  2  . Multiple Vitamins-Minerals (MULTIVITAMIN PO) Take 1 tablet by mouth.      . oxyCODONE-acetaminophen (PERCOCET) 10-325 MG per tablet Take 1-2 tablets by mouth every 4-6 hours as needed for Arthritis pain  180 tablet  0  . trazodone (DESYREL) 300 MG tablet Take 300 mg by mouth at bedtime.      [name] warfarin (COUMADIN) 5 MG tablet Take 1 tablet daily except 1/2 tablet on  Wednesdays and Fridays  30 tablet  2   No current facility-administered medications for this visit.    Allergies as of 06/19/2014 - Review Complete 06/19/2014  Allergen Reaction Noted  . Clindamycin hcl Other (See Comments)     Vitals: BP 127/77  Pulse 72  Resp 16  Wt 222 lb (100.699 kg) Last Weight:  Wt Readings from Last 1 Encounters:  06/19/14 222 lb (100.699 kg)   Last Height:   Ht Readings from Last 1 Encounters:  05/14/14 5\' 3"  (1.6 m)    Physical exam:  General: The patient is awake, alert and appears not in acute distress. The patient is well groomed. Head: Normocephalic, atraumatic. Neck is supple. Mallampati 2 , neck circumference:15 .  Full upper dentures,   Cardiovascular:  Regular rate and rhythm, without  murmurs or carotid bruit, and without distended neck veins. Respiratory: Lungs are clear to auscultation. Skin:  Without evidence of edema, or rash Trunk: BMI is  elevated and patient  has normal posture.  Neurologic exam : The patient is awake and alert, oriented to place and time.  Memory  subjective  described as intact. There is a normal attention span & concentration ability. Speech is fluent without dysarthria, but dysphonia . Mood and affect are depressed.  Cranial nerves: Pupils are equal and briskly reactive to light. Funduscopic exam without   evidence of pallor or edema.  Extraocular movements  in vertical and horizontal planes intact and without nystagmus. Visual fields by finger perimetry are intact. Hearing to finger rub intact.  Facial sensation intact to fine touch. Facial motor strength is symmetric and tongue and uvula move midline.  Motor exam:   tone and normal muscle bulk and symmetric normal strength in all extremities. Grip strength intact.   Sensory:  Fine touch, pinprick and vibration were tested in all extremities. Proprioception was normal in upper extremities, but lost in right leg, the "DVT " leg.  Coordination: Rapid alternating movements in the fingers/hands is tested and normal.  Finger-to-nose maneuver tested and normal without evidence of ataxia, dysmetria or tremor.  Gait and station: Patient walks with a cane  assistive device need help to  climb up to the exam table.   Stance is wide based - fragmented.  Deep tendon reflexes: in the  upper and lower extremities are symmetric and intact. Babinski  downgoing.   Assessment:  After physical and neurologic examination, review of laboratory studies, imaging, neurophysiology testing and pre-existing records, assessment is   1) patient may have a history of OSA, but her physiological markers do not raise a  high index suspicions.   She snores herself awake, she has paroxysmal a fib- palpitations.    Plan:  Treatment plan and additional workup : SPLIT study with score at 4 % and AHI at 15. Use nasal pillows please. ,

## 2014-06-24 DIAGNOSIS — M25569 Pain in unspecified knee: Secondary | ICD-10-CM | POA: Diagnosis not present

## 2014-06-25 DIAGNOSIS — M47817 Spondylosis without myelopathy or radiculopathy, lumbosacral region: Secondary | ICD-10-CM | POA: Diagnosis not present

## 2014-07-03 ENCOUNTER — Other Ambulatory Visit: Payer: Self-pay | Admitting: *Deleted

## 2014-07-03 DIAGNOSIS — H5210 Myopia, unspecified eye: Secondary | ICD-10-CM | POA: Diagnosis not present

## 2014-07-03 DIAGNOSIS — H353190 Nonexudative age-related macular degeneration, unspecified eye, stage unspecified: Secondary | ICD-10-CM | POA: Diagnosis not present

## 2014-07-03 MED ORDER — OXYCODONE-ACETAMINOPHEN 10-325 MG PO TABS: ORAL_TABLET | ORAL | Status: DC

## 2014-07-06 ENCOUNTER — Ambulatory Visit (INDEPENDENT_AMBULATORY_CARE_PROVIDER_SITE_OTHER): Payer: Medicare Other | Admitting: Pharmacotherapy

## 2014-07-06 ENCOUNTER — Telehealth: Payer: Self-pay

## 2014-07-06 ENCOUNTER — Encounter: Payer: Self-pay | Admitting: Pharmacotherapy

## 2014-07-06 VITALS — BP 126/74 | HR 77 | Temp 98.9°F | Resp 20 | Ht 63.0 in | Wt 220.0 lb

## 2014-07-06 DIAGNOSIS — I82409 Acute embolism and thrombosis of unspecified deep veins of unspecified lower extremity: Secondary | ICD-10-CM

## 2014-07-06 DIAGNOSIS — F325 Major depressive disorder, single episode, in full remission: Secondary | ICD-10-CM | POA: Diagnosis not present

## 2014-07-06 DIAGNOSIS — M47817 Spondylosis without myelopathy or radiculopathy, lumbosacral region: Secondary | ICD-10-CM | POA: Diagnosis not present

## 2014-07-06 DIAGNOSIS — Z7901 Long term (current) use of anticoagulants: Secondary | ICD-10-CM

## 2014-07-06 LAB — POCT INR: INR: 1.5

## 2014-07-06 MED ORDER — WARFARIN SODIUM 5 MG PO TABS: 5.0000 mg | ORAL_TABLET | Freq: Every day | ORAL | Status: DC

## 2014-07-06 NOTE — Progress Notes (Signed)
Subjective:     Patient ID: Andrea [name], female   DOB: 11-Nov-1935, 79 y.o.   MRN: [id number]  HPI Last INR was OK.   Current Coumadin dose is 5mg  daily except 2.5mg  Wednesdays and Fridays. Denies missed doses. Denies unusual bleeding or bruising. Denies CP, SOB, falls. Has been eating more vitamin K intake.   Review of Systems  HENT: Negative for nosebleeds.   Respiratory: Negative for shortness of breath.   Cardiovascular: Negative for chest pain and leg swelling.  Gastrointestinal: Negative for blood in stool and anal bleeding.  Genitourinary: Negative for hematuria.  Hematological: Does not bruise/bleed easily.       Objective:   Physical Exam  Vitals reviewed. Constitutional: She is oriented to person, place, and time. She appears well-developed and well-nourished.  HENT:  Right Ear: External ear normal.  Left Ear: External ear normal.  Eyes: Pupils are equal, round, and reactive to light.  Cardiovascular: Normal rate, regular rhythm and normal heart sounds.   Pulmonary/Chest: Effort normal and breath sounds normal.  Musculoskeletal:  Uses a rolling seating walker  Neurological: She is alert and oriented to person, place, and time.  Skin: Skin is warm and dry.  Psychiatric: She has a normal mood and affect. Her behavior is normal.    BP:  132/100, repeat BP 126/78 HR:  77 Wt:  220lb     Assessment:     INR 1.5     Plan:     1.  INR below goal 2-3 due to increased vitamin K intake 2.  Increase Coumadin 5mg  QD 3.  RTC 2 weeks

## 2014-07-06 NOTE — Patient Instructions (Signed)
INR 1.5 Increase Coumadin 5mg  daily (1 tablet)

## 2014-07-06 NOTE — Telephone Encounter (Signed)
[name] (patient's caregiver) was in office and completed triage form requesting for patient to take an additional pill of Trazodone at bedtime.  Please advise Last OV 05/04/14, pending OV 07/13/2014

## 2014-07-07 NOTE — Telephone Encounter (Signed)
She is already taking 300mg  of trazodone at bedtime.  She might not wake back up if she takes more than that.  No.

## 2014-07-07 NOTE — Telephone Encounter (Signed)
Mailbox was full, unable to leave message. I will try to reach patient again later

## 2014-07-09 NOTE — Telephone Encounter (Signed)
Left message on voicemail for patient to return call when available   

## 2014-07-13 ENCOUNTER — Encounter: Payer: Self-pay | Admitting: Internal Medicine

## 2014-07-13 ENCOUNTER — Ambulatory Visit (INDEPENDENT_AMBULATORY_CARE_PROVIDER_SITE_OTHER): Payer: Medicare Other | Admitting: Internal Medicine

## 2014-07-13 VITALS — BP 126/80 | HR 73 | Temp 98.8°F | Resp 10 | Wt 219.0 lb

## 2014-07-13 DIAGNOSIS — H1045 Other chronic allergic conjunctivitis: Secondary | ICD-10-CM

## 2014-07-13 DIAGNOSIS — H04129 Dry eye syndrome of unspecified lacrimal gland: Secondary | ICD-10-CM | POA: Diagnosis not present

## 2014-07-13 DIAGNOSIS — G4700 Insomnia, unspecified: Secondary | ICD-10-CM | POA: Diagnosis not present

## 2014-07-13 DIAGNOSIS — IMO0002 Reserved for concepts with insufficient information to code with codable children: Secondary | ICD-10-CM

## 2014-07-13 DIAGNOSIS — F341 Dysthymic disorder: Secondary | ICD-10-CM

## 2014-07-13 DIAGNOSIS — M5416 Radiculopathy, lumbar region: Secondary | ICD-10-CM

## 2014-07-13 DIAGNOSIS — G894 Chronic pain syndrome: Secondary | ICD-10-CM

## 2014-07-13 DIAGNOSIS — H1013 Acute atopic conjunctivitis, bilateral: Secondary | ICD-10-CM

## 2014-07-13 DIAGNOSIS — H04123 Dry eye syndrome of bilateral lacrimal glands: Secondary | ICD-10-CM

## 2014-07-13 MED ORDER — PREDNISOLONE ACETATE 0.12 % OP SUSP: 1.0000 [drp] | Freq: Four times a day (QID) | OPHTHALMIC | Status: AC

## 2014-07-13 NOTE — Telephone Encounter (Signed)
Patient was seen in office today.  
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
## 2014-07-16 DIAGNOSIS — M47817 Spondylosis without myelopathy or radiculopathy, lumbosacral region: Secondary | ICD-10-CM | POA: Diagnosis not present

## 2014-07-20 ENCOUNTER — Encounter: Payer: Self-pay | Admitting: Pharmacotherapy

## 2014-07-20 ENCOUNTER — Ambulatory Visit (INDEPENDENT_AMBULATORY_CARE_PROVIDER_SITE_OTHER): Payer: Medicare Other | Admitting: Pharmacotherapy

## 2014-07-20 VITALS — BP 140/72 | HR 63 | Temp 98.5°F | Resp 20 | Ht 63.0 in | Wt 219.2 lb

## 2014-07-20 DIAGNOSIS — I82409 Acute embolism and thrombosis of unspecified deep veins of unspecified lower extremity: Secondary | ICD-10-CM | POA: Diagnosis not present

## 2014-07-20 DIAGNOSIS — Z7901 Long term (current) use of anticoagulants: Secondary | ICD-10-CM | POA: Diagnosis not present

## 2014-07-20 LAB — POCT INR: INR: 2.6

## 2014-07-20 NOTE — Patient Instructions (Signed)
INR 2.6 Continue Coumadin 5mg  (1 tablet) daily

## 2014-07-20 NOTE — Progress Notes (Signed)
   Subjective:    Patient ID: Andrea [name], female    DOB: August 08, 1935, 79 y.o.   MRN: [id number]  HPI Last INR was low at 1.5. Her Coumadin was increased to 5mg  daily. She missed her dose last night.  Went to bed without taking it. Denies unusual bleeding or bruising. Denies CP, SOB, falls Consistent with vitamin K intake.    Review of Systems  HENT: Negative for nosebleeds.   Respiratory: Negative for shortness of breath.   Cardiovascular: Negative for chest pain.  Gastrointestinal: Negative for blood in stool and anal bleeding.  Genitourinary: Negative for hematuria.  Hematological: Does not bruise/bleed easily.       Objective:   Physical Exam  Constitutional: She is oriented to person, place, and time. She appears well-developed and well-nourished.  HENT:  Right Ear: External ear normal.  Left Ear: External ear normal.  Cardiovascular: Normal rate, regular rhythm and normal heart sounds.   Pulmonary/Chest: Effort normal and breath sounds normal.  Musculoskeletal:  Requires rolling/seated walker for ambulation assist.  Neurological: She is alert and oriented to person, place, and time.  Skin: Skin is warm and dry.  Psychiatric: She has a normal mood and affect. Her behavior is normal.    BP:  140/72   HR:  63  Wt:  219lb  INR 2.6        Assessment & Plan:  1.  INR at 2-3 goal 2.  Continue Coumadin 5mg  QD. 3.  RTC in 1 month

## 2014-07-23 DIAGNOSIS — M47817 Spondylosis without myelopathy or radiculopathy, lumbosacral region: Secondary | ICD-10-CM | POA: Diagnosis not present

## 2014-07-24 ENCOUNTER — Encounter: Payer: Self-pay | Admitting: Neurology

## 2014-07-24 ENCOUNTER — Ambulatory Visit (INDEPENDENT_AMBULATORY_CARE_PROVIDER_SITE_OTHER): Payer: Medicare Other | Admitting: Neurology

## 2014-07-24 ENCOUNTER — Other Ambulatory Visit: Payer: Self-pay | Admitting: Neurology

## 2014-07-24 DIAGNOSIS — R0609 Other forms of dyspnea: Secondary | ICD-10-CM | POA: Diagnosis not present

## 2014-07-24 DIAGNOSIS — R0989 Other specified symptoms and signs involving the circulatory and respiratory systems: Secondary | ICD-10-CM

## 2014-07-24 DIAGNOSIS — G4733 Obstructive sleep apnea (adult) (pediatric): Secondary | ICD-10-CM

## 2014-07-28 DIAGNOSIS — F325 Major depressive disorder, single episode, in full remission: Secondary | ICD-10-CM | POA: Diagnosis not present

## 2014-07-30 ENCOUNTER — Telehealth: Payer: Self-pay | Admitting: *Deleted

## 2014-07-30 NOTE — Telephone Encounter (Signed)
LMOM to return call.

## 2014-07-30 NOTE — Telephone Encounter (Signed)
Patient caregiver, [name] called and stated that patient's eyes are watery and itchy from allergies. Stated that you prescribed Prednisone eye drops before. They want to know if this should be prescribed again or should she take Zyrtec? Please Advise.

## 2014-07-30 NOTE — Telephone Encounter (Signed)
She should not be on the prednisone drops forever.  May do better to take zyrtec daily during allergy season.

## 2014-07-30 NOTE — Telephone Encounter (Signed)
[name], Caregiver, Notified and will discuss with patient about taking Zyrtec OTC for her allergies.

## 2014-08-03 DIAGNOSIS — M47817 Spondylosis without myelopathy or radiculopathy, lumbosacral region: Secondary | ICD-10-CM | POA: Diagnosis not present

## 2014-08-04 ENCOUNTER — Ambulatory Visit (INDEPENDENT_AMBULATORY_CARE_PROVIDER_SITE_OTHER): Payer: Medicare Other

## 2014-08-04 DIAGNOSIS — Z23 Encounter for immunization: Secondary | ICD-10-CM | POA: Diagnosis not present

## 2014-08-06 ENCOUNTER — Telehealth: Payer: Self-pay | Admitting: *Deleted

## 2014-08-06 NOTE — Telephone Encounter (Signed)
Patient called wanting her sleep study results. It has not resulted yet. Patient is going to call Dr. Edwena Felty office to check on this.

## 2014-08-10 ENCOUNTER — Encounter: Payer: Self-pay | Admitting: Neurology

## 2014-08-11 ENCOUNTER — Other Ambulatory Visit: Payer: Self-pay | Admitting: Neurology

## 2014-08-11 ENCOUNTER — Telehealth: Payer: Self-pay | Admitting: *Deleted

## 2014-08-11 DIAGNOSIS — G4733 Obstructive sleep apnea (adult) (pediatric): Secondary | ICD-10-CM

## 2014-08-11 DIAGNOSIS — H16229 Keratoconjunctivitis sicca, not specified as Sjogren's, unspecified eye: Secondary | ICD-10-CM | POA: Diagnosis not present

## 2014-08-11 NOTE — Telephone Encounter (Signed)
Spoke to patient regarding her recent overnight split study.  Informed the patient there was a positive finding for obstructive sleep apnea and CPAP therapy was advised by the doctor.  Patient was informed that her information would be sent to [facility] for her to procure her CPAP equipment.  Patient was eager to start on CPAP.  Patient instructed a copy of the results would be mailed to her and a copy would be sent to Dr. Early Osmond.

## 2014-08-13 ENCOUNTER — Telehealth: Payer: Self-pay | Admitting: *Deleted

## 2014-08-13 NOTE — Telephone Encounter (Signed)
[name], Caregiver, called and stated that patient fell this morning and hit her head on the cement. Patient does not want to go to hospital but I spoke with Dr. Mariea Clonts and she stated that patient needs to go due to her being on Coumadin. She needs to be evaluated. Explained this to Caregiver and she will take her to ER.

## 2014-08-23 ENCOUNTER — Encounter: Payer: Self-pay | Admitting: Internal Medicine

## 2014-08-24 ENCOUNTER — Encounter: Payer: Self-pay | Admitting: Pharmacotherapy

## 2014-08-24 ENCOUNTER — Ambulatory Visit (INDEPENDENT_AMBULATORY_CARE_PROVIDER_SITE_OTHER): Payer: Medicare Other | Admitting: Pharmacotherapy

## 2014-08-24 VITALS — BP 120/74 | HR 60 | Temp 98.2°F | Resp 18 | Ht 63.0 in | Wt 215.0 lb

## 2014-08-24 DIAGNOSIS — I82409 Acute embolism and thrombosis of unspecified deep veins of unspecified lower extremity: Secondary | ICD-10-CM

## 2014-08-24 LAB — POCT INR: INR: 2.6

## 2014-08-24 NOTE — Patient Instructions (Signed)
INR 2.6 Continue Coumadin 5mg  (1 tablet) daily

## 2014-08-24 NOTE — Progress Notes (Signed)
   Subjective:    Patient ID: Andrea [name], female    DOB: January 04, 1935, 79 y.o.   MRN: [id number]  HPI Last INR was OK at 2.8 Current Coumadin dose is 5mg  QD. Denies missed doses. Denies unusual bleeding or bruising. Denies CP, SOB, falls Consistent with vitamin K intake    Review of Systems  HENT: Negative for nosebleeds.   Respiratory: Negative for shortness of breath.   Cardiovascular: Negative for chest pain and leg swelling.  Gastrointestinal: Negative for blood in stool and anal bleeding.  Genitourinary: Negative for hematuria.  Hematological: Does not bruise/bleed easily.       Objective:   Physical Exam  Vitals reviewed. Constitutional: She is oriented to person, place, and time. She appears well-developed and well-nourished.  HENT:  Right Ear: External ear normal.  Left Ear: External ear normal.  Cardiovascular: Normal rate, regular rhythm and normal heart sounds.   Pulmonary/Chest: Effort normal and breath sounds normal.  Neurological: She is alert and oriented to person, place, and time.  Skin: Skin is warm and dry.  Psychiatric: She has a normal mood and affect. Her behavior is normal.    INR 2.6  BP: 120/74  HR:  60  Wt:  215lb       Assessment & Plan:  1.  INR at goal 2-3 2.  Continue Coumadin 5mg  daily 3.  INR in 1 month

## 2014-09-07 DIAGNOSIS — F332 Major depressive disorder, recurrent severe without psychotic features: Secondary | ICD-10-CM | POA: Diagnosis not present

## 2014-09-14 DIAGNOSIS — M545 Low back pain: Secondary | ICD-10-CM | POA: Diagnosis not present

## 2014-09-17 DIAGNOSIS — M545 Low back pain: Secondary | ICD-10-CM | POA: Diagnosis not present

## 2014-09-21 ENCOUNTER — Encounter: Payer: Self-pay | Admitting: Internal Medicine

## 2014-09-21 ENCOUNTER — Ambulatory Visit (INDEPENDENT_AMBULATORY_CARE_PROVIDER_SITE_OTHER): Payer: Medicare Other | Admitting: Internal Medicine

## 2014-09-21 VITALS — BP 128/74 | HR 75 | Temp 98.6°F | Resp 18 | Ht 63.0 in | Wt 212.0 lb

## 2014-09-21 DIAGNOSIS — R208 Other disturbances of skin sensation: Secondary | ICD-10-CM

## 2014-09-21 DIAGNOSIS — R200 Anesthesia of skin: Secondary | ICD-10-CM

## 2014-09-21 DIAGNOSIS — G894 Chronic pain syndrome: Secondary | ICD-10-CM | POA: Diagnosis not present

## 2014-09-21 DIAGNOSIS — L570 Actinic keratosis: Secondary | ICD-10-CM | POA: Diagnosis not present

## 2014-09-21 DIAGNOSIS — M545 Low back pain: Secondary | ICD-10-CM | POA: Diagnosis not present

## 2014-09-21 DIAGNOSIS — G4733 Obstructive sleep apnea (adult) (pediatric): Secondary | ICD-10-CM

## 2014-09-21 DIAGNOSIS — Z9989 Dependence on other enabling machines and devices: Secondary | ICD-10-CM

## 2014-09-21 DIAGNOSIS — N3946 Mixed incontinence: Secondary | ICD-10-CM

## 2014-09-21 DIAGNOSIS — R209 Unspecified disturbances of skin sensation: Secondary | ICD-10-CM | POA: Diagnosis not present

## 2014-09-21 MED ORDER — OXYCODONE-ACETAMINOPHEN 10-325 MG PO TABS: ORAL_TABLET | ORAL | Status: DC

## 2014-09-21 MED ORDER — MELOXICAM 15 MG PO TABS: 15.0000 mg | ORAL_TABLET | Freq: Every day | ORAL | Status: DC

## 2014-09-21 NOTE — Progress Notes (Signed)
Patient ID: Andrea [name], female   DOB: 06-Sep-1935, 79 y.o.   MRN: [id number]   Location:  [facility]  Allergies  Allergen Reactions  . Clindamycin Hcl Other (See Comments)    REACTION: swelling, pt. Reports that she had a 16 lb. weightgain in one day    Chief Complaint  Patient presents with  . Medical Management of Chronic Issues    back lesion    HPI: Patient is a 79 y.o. white female seen in the office today for several new concerns.    Having more bladder leakage at night.  Using pads regularly and tired of it.  Sometimes can get to the bathroom, but at night, gets urge and minute she stands she goes.  A combination of pain and urge.  Had to change pad 4 times overnight.  Wants a urology referral.  Using cpap for sleep.  Helping tremendously.  Had first round of PT in the pool.  First time, made back hurt some and feels like a knife in it.  Has PT appt at 1pm.    Has a thing on her back and a thing on her hand.  Fingers are going numb when goes to sleep.  Is not painful, but wonders if it will worsen.  Has been in the past 2 mos.  Had prior neck surgery.   Review of Systems:  Review of Systems  Constitutional: Positive for malaise/fatigue. Negative for fever and chills.  Eyes: Negative for blurred vision.  Respiratory: Negative for shortness of breath.   Cardiovascular: Negative for chest pain and leg swelling.  Gastrointestinal: Negative for constipation.  Genitourinary: Negative for dysuria, urgency and frequency.  Musculoskeletal: Positive for back pain and neck pain. Negative for falls.  Neurological: Positive for weakness.  Psychiatric/Behavioral: The patient is nervous/anxious.      Past Medical History  Diagnosis Date  . Hypotension   . Depression with anxiety   . Cough     Wert-onset 08/2009, as of 2014- resolved   . Deep venous thrombosis     post op, rec'd/needed  blood thinner   . Cervicalgia   .  Hypothyroidism   . Blood transfusion   . Constipation   . Shortness of breath   . Anxiety   . Dysrhythmia   . Anemia   . Depression   . Lumbar radiculopathy 09/26/2011  . Headache(784.0) 09/27/2011  . Pneumonia     never been in hosp. for pneumonia   . Sleep apnea     doesn't use CPAP any longer, since weight loss  . Arthritis     hands, spine   . History of stress test     15-20 yrs.ago   . Falls frequently     pt. reports that she was falling 3 times a day, has had PT at a facility for a while & now is getting home PT 2-3 times/ week   . Right hand fracture     Monday  . Prediabetes     Past Surgical History  Procedure Laterality Date  . Replacement total knee bilateral    . Appendectomy    . Breast excisional biopsy      breast bxs on left x 2, breast bx of right x 1-all benign  . Laparoscopic ovarian cystectomy      not done by laparscopy-abdominal incision  . Joint replacement  [id number]    bilateral  . Tonsillectomy    . Eye surgery  cataracts bilateral /w IOL  . Hernia repair      umbilical hernia- 0814  . Back surgery      2000  . Anterior cervical decomp/discectomy fusion N/A 07/09/2013    Procedure: ANTERIOR CERVICAL DECOMPRESSION/DISCECTOMY FUSION 3 LEVELS;  Surgeon: Sinclair Ship, MD;  Location: [facility];  Service: Orthopedics;  Laterality: N/A;  Anterior cervical decompression fusion, cervical 3-4, cervical 4-5-, cervical 5-6 with instrumentation, allograft.    Social History:   reports that she quit smoking about 45 years ago. Her smoking use included Cigarettes. She has a 10 pack-year smoking history. She has never used smokeless tobacco. She reports that she drinks about 0.6 oz of alcohol per week. She reports that she does not use illicit drugs.  Family History  Problem Relation Age of Onset  . Heart disease Father   . Malignant hyperthermia Father   . Arthritis Father   . Deep vein thrombosis Son   . Diabetes Son   . Obesity Son   .  Depression Son   . Hypertension Son   . Parkinson's disease Mother   . Diabetes Daughter   . Hypertension Daughter   . Anxiety disorder Daughter   . Cancer Maternal Aunt   . Hyperlipidemia Son   . Hypertension Son     Medications: Patient's Medications  New Prescriptions   No medications on file  Previous Medications   ALBUTEROL (PROVENTIL HFA;VENTOLIN HFA) 108 (90 BASE) MCG/ACT INHALER    Inhale 2 puffs into the lungs every 6 (six) hours as needed for wheezing or shortness of breath.   ARIPIPRAZOLE (ABILIFY) 10 MG TABLET    Take 10 mg by mouth daily.   BUSPIRONE (BUSPAR) 7.5 MG TABLET    Take 15 mg by mouth daily. 1 by mouth bid x 7 days then 15 mg daily   CALCIUM CITRATE PO    Take 600 mg by mouth 2 (two) times daily.   CLONAZEPAM (KLONOPIN) 0.5 MG TABLET    Take 0.5 mg by mouth as needed for anxiety.    DULOXETINE HCL (CYMBALTA PO)    Take 120 mg by mouth daily.    FUROSEMIDE (LASIX) 40 MG TABLET    Take 1 tablet (40 mg total) by mouth daily.   LEVOTHYROXINE (SYNTHROID, LEVOTHROID) 50 MCG TABLET    Take 1 tablet (50 mcg total) by mouth every morning.   MELOXICAM (MOBIC) 15 MG TABLET    Take 15 mg by mouth daily.   MULTIPLE VITAMINS-MINERALS (MULTIVITAMIN PO)    Take 1 tablet by mouth.   OXYCODONE-ACETAMINOPHEN (PERCOCET) 10-325 MG PER TABLET    Take 1-2 tablets by mouth every 4-6 hours as needed for Arthritis pain   TRAZODONE (DESYREL) 100 MG TABLET    Take 100 mg by mouth. 4 by mouth daily   WARFARIN (COUMADIN) 5 MG TABLET    Take 5 mg by mouth at bedtime.  Modified Medications   No medications on file  Discontinued Medications   No medications on file     Physical Exam: Filed Vitals:   09/21/14 1155  BP: 128/74  Pulse: 75  Temp: 98.6 F (37 C)  TempSrc: Oral  Resp: 18  Height: 5\' 3"  (1.6 m)  Weight: 212 lb (96.163 kg)  SpO2: 99%  Physical Exam  Constitutional: She is oriented to person, place, and time. She appears well-developed and well-nourished.  Walking  more steadily with cane  Cardiovascular: Normal rate, regular rhythm, normal heart sounds and intact distal pulses.  Pulmonary/Chest: Effort normal and breath sounds normal. No respiratory distress.  Abdominal: Soft. Bowel sounds are normal. She exhibits no distension and no mass. There is no tenderness.  Genitourinary:  No suprapubic tenderness  Neurological: She is alert and oriented to person, place, and time.  No current sensory loss of ulnar side of hands  Skin:  Two small actinic keratoses of back--left side just beneath bra and right side also;  Another one on her left hand  Psychiatric: She has a normal mood and affect.    Labs reviewed: Basic Metabolic Panel:  Recent Labs  11/23/13 1057 02/12/14 1122 04/22/14 1135  NA 138 142 140  K 4.0 4.4 4.5  CL 101 102 100  CO2 26 24 27   GLUCOSE 112* 86 83  BUN 23 26 24   CREATININE 0.79 0.84 0.91  CALCIUM 8.6 8.9 9.0  TSH  --  1.980  --    Liver Function Tests:  Recent Labs  02/12/14 1122  AST 23  ALT 15  ALKPHOS 85  BILITOT <0.2  PROT 6.4   No results for input(s): LIPASE, AMYLASE in the last 8760 hours. No results for input(s): AMMONIA in the last 8760 hours. CBC:  Recent Labs  11/23/13 1057 02/12/14 1122 04/22/14 1135  WBC 6.7 5.6 6.0  NEUTROABS  --  3.2 4.2  HGB 12.4 12.4 12.2  HCT 37.5 37.4 37.7  MCV 83.7 81 84  PLT 251 304 195   Lipid Panel:  Recent Labs  02/12/14 1122  HDL 49  LDLCALC 124*  TRIG 131  CHOLHDL 4.1   Lab Results  Component Value Date   HGBA1C 6.0* 02/12/2014   Assessment/Plan 1. Mixed urge and stress incontinence - will attempt to obtain urine dipstick and send for culture if positive  -will then treat if positive for UTI--if symptoms persist, would refer to urology for further evaluation and treatment - Basic metabolic panel also checked due to meloxicam use  2. Actinic keratosis -cont to monitor--if change or become painful or irritating, use cryopen or refer to derm  if pt desires  3. Chronic pain syndrome -percocet refilled for low back pain and cont water aerobics per orthopedics - Basic metabolic panel  4. Numbness of fingers of both hands -suspect due to cervical spine arthritis--advised to inquire from her surgeon if progresses - Basic metabolic panel  5.  OSA on CPAP--sleeping much better other than her urinary symptoms now with this   Labs/tests ordered:  Urine dipstick and probable culture if appears positive for wbc, rbc, nitrites  Next appt:  Keep later this month  Paul Torpey L. Donyel Nester, D.O. [facility] [address], [place] [address] Cell Phone (Mon-Fri 8am-5pm):  [phone] On Call:  [phone] & follow prompts after 5pm & weekends Office Phone:  [phone] Office Fax:  [phone]

## 2014-09-22 ENCOUNTER — Other Ambulatory Visit: Payer: Medicare Other

## 2014-09-22 DIAGNOSIS — N3946 Mixed incontinence: Secondary | ICD-10-CM

## 2014-09-22 LAB — BASIC METABOLIC PANEL
BUN/Creatinine Ratio: 32 — ABNORMAL HIGH (ref 11–26)
BUN: 31 mg/dL — ABNORMAL HIGH (ref 8–27)
CO2: 25 mmol/L (ref 18–29)
Calcium: 10 mg/dL (ref 8.7–10.3)
Chloride: 97 mmol/L (ref 97–108)
Creatinine, Ser: 0.98 mg/dL (ref 0.57–1.00)
GFR calc Af Amer: 64 mL/min/{1.73_m2} (ref >59)
GFR calc non Af Amer: 56 mL/min/{1.73_m2} — ABNORMAL LOW (ref >59)
Glucose: 102 mg/dL — ABNORMAL HIGH (ref 65–99)
Potassium: 4.6 mmol/L (ref 3.5–5.2)
Sodium: 143 mmol/L (ref 134–144)

## 2014-09-22 NOTE — Addendum Note (Signed)
Addended by: Elijah Birk L on: 09/22/2014 10:15 AM   Modules accepted: Orders

## 2014-09-23 ENCOUNTER — Telehealth: Payer: Self-pay | Admitting: *Deleted

## 2014-09-23 DIAGNOSIS — M545 Low back pain: Secondary | ICD-10-CM | POA: Diagnosis not present

## 2014-09-23 LAB — URINALYSIS
Bilirubin, UA: NEGATIVE
Glucose, UA: NEGATIVE
Ketones, UA: NEGATIVE
Nitrite, UA: POSITIVE — AB
Protein, UA: NEGATIVE
Specific Gravity, UA: 1.022 (ref 1.005–1.030)
Urobilinogen, Ur: 0.2 mg/dL (ref 0.2–1.0)
pH, UA: 6 (ref 5.0–7.5)

## 2014-09-23 NOTE — Telephone Encounter (Signed)
-----   Message from Gayland Curry, DO sent at 09/23/2014 10:45 AM EST ----- Urinalysis is positive for infection.  Culture has not yet returned.  She needs to be drinking more water based on her kidney function results.  Let's wait for the culture if she can so we don't mess up her coumadin with antibiotics and have to change them midstream.

## 2014-09-23 NOTE — Telephone Encounter (Signed)
Spoke with pt. Explained to her that she need's to up her water intake, until further notice re: culture result's. Pt. Understood fully.

## 2014-09-24 LAB — URINE CULTURE

## 2014-09-24 MED ORDER — AMOXICILLIN-POT CLAVULANATE 875-125 MG PO TABS: 1.0000 | ORAL_TABLET | Freq: Two times a day (BID) | ORAL | Status: DC

## 2014-09-24 NOTE — Telephone Encounter (Signed)
Urine Culture Results  Augmentin 875/125mg  po q12 hrs for 7 days for UTI.    Eat yogurt daily also while taking it to prevent yeast infections    Continue plenty of water intake    Keep appt for INR on Monday 11/9 with [name]      Discussed with patient, patient verbalized understanding. RX sent

## 2014-09-28 ENCOUNTER — Encounter: Payer: Self-pay | Admitting: Pharmacotherapy

## 2014-09-28 ENCOUNTER — Ambulatory Visit (INDEPENDENT_AMBULATORY_CARE_PROVIDER_SITE_OTHER): Payer: Medicare Other | Admitting: Pharmacotherapy

## 2014-09-28 VITALS — BP 130/70 | HR 62 | Temp 98.8°F | Ht 63.0 in | Wt 215.0 lb

## 2014-09-28 DIAGNOSIS — M545 Low back pain: Secondary | ICD-10-CM | POA: Diagnosis not present

## 2014-09-28 DIAGNOSIS — I82409 Acute embolism and thrombosis of unspecified deep veins of unspecified lower extremity: Secondary | ICD-10-CM

## 2014-09-28 LAB — POCT INR: INR: 2.8

## 2014-09-28 NOTE — Progress Notes (Signed)
   Subjective:    Patient ID: Andrea [name], female    DOB: October 19, 1935, 79 y.o.   MRN: [id number]  HPI Last INR on 08/24/14 was OK at 2.6 Current coumadin dose is 5mg  QD. Denies missed doses. Denies unusual bleeding or bruising. Denies CP, SOB, falls Consistent with vitamin K foods.   Review of Systems  HENT: Negative for nosebleeds.   Respiratory: Negative for shortness of breath.   Cardiovascular: Negative for chest pain.  Gastrointestinal: Negative for blood in stool and anal bleeding.  Genitourinary: Negative for hematuria.  Hematological: Does not bruise/bleed easily.       Objective:   Physical Exam  Constitutional: She is oriented to person, place, and time. She appears well-developed.  HENT:  Right Ear: External ear normal.  Left Ear: External ear normal.  Cardiovascular: Normal rate, regular rhythm and normal heart sounds.   Pulmonary/Chest: Effort normal and breath sounds normal.  Neurological: She is alert and oriented to person, place, and time.  Skin: Skin is warm and dry.  Psychiatric: She has a normal mood and affect.  Vitals reviewed.   BP:  130/700  HR  62  Wt:  215lb  INR 2.8       Assessment & Plan:  1.  INR at goal 2-3. 2.  Continue Coumadin 5mg  QD 3.  RTC in 1 month

## 2014-09-28 NOTE — Patient Instructions (Signed)
INR 2.8 Continue Coumadin 5mg  daily

## 2014-10-01 DIAGNOSIS — M545 Low back pain: Secondary | ICD-10-CM | POA: Diagnosis not present

## 2014-10-12 ENCOUNTER — Encounter: Payer: Self-pay | Admitting: Internal Medicine

## 2014-10-12 ENCOUNTER — Ambulatory Visit (INDEPENDENT_AMBULATORY_CARE_PROVIDER_SITE_OTHER): Payer: Medicare Other | Admitting: Internal Medicine

## 2014-10-12 VITALS — BP 132/80 | HR 72 | Temp 98.2°F | Resp 12 | Ht 63.0 in | Wt 213.0 lb

## 2014-10-12 DIAGNOSIS — G4733 Obstructive sleep apnea (adult) (pediatric): Secondary | ICD-10-CM

## 2014-10-12 DIAGNOSIS — R7309 Other abnormal glucose: Secondary | ICD-10-CM | POA: Diagnosis not present

## 2014-10-12 DIAGNOSIS — R739 Hyperglycemia, unspecified: Secondary | ICD-10-CM | POA: Diagnosis not present

## 2014-10-12 DIAGNOSIS — Z9989 Dependence on other enabling machines and devices: Secondary | ICD-10-CM | POA: Insufficient documentation

## 2014-10-12 DIAGNOSIS — N3946 Mixed incontinence: Secondary | ICD-10-CM | POA: Diagnosis not present

## 2014-10-12 DIAGNOSIS — E039 Hypothyroidism, unspecified: Secondary | ICD-10-CM

## 2014-10-12 DIAGNOSIS — G894 Chronic pain syndrome: Secondary | ICD-10-CM | POA: Diagnosis not present

## 2014-10-12 DIAGNOSIS — I7300 Raynaud's syndrome without gangrene: Secondary | ICD-10-CM

## 2014-10-12 LAB — POCT URINALYSIS DIPSTICK
Bilirubin, UA: NEGATIVE
Glucose, UA: NEGATIVE
Ketones, UA: NEGATIVE
Nitrite, UA: NEGATIVE
Protein, UA: NEGATIVE
Spec Grav, UA: 1.025
Urobilinogen, UA: 0.2
pH, UA: 6

## 2014-10-12 NOTE — Progress Notes (Signed)
Patient ID: Andrea [name], female   DOB: 01-Dec-1934, 79 y.o.   MRN: [id number]   Location:  [facility]  Code Status: DNR  Allergies  Allergen Reactions  . Clindamycin Hcl Other (See Comments)    REACTION: swelling, pt. Reports that she had a 16 lb. weightgain in one day    Chief Complaint  Patient presents with  . Medical Management of Chronic Issues    3 month follow-up  . Urinary Tract Infection    Patient would like a f/u on her urine, no symptoms today    HPI: Patient is a 79 y.o. white female seen in the office today for med mgt chronic diseases.    She was treated for a UTI after the last visit.  Now wants a f/u urine done.  Is feeling better in terms of her urine.  Urinary frequency resolved.  Only mild dysuria now.    Wants to ask about Raynaud's.  Only lasts with whiteness and pain for a few mins.  Wondered if it gets worse with age and time.  Discussed that it can spontaneously remit.  Mentions that her daughter is having medical problems.  Review of Systems:  ROS   Past Medical History  Diagnosis Date  . Hypotension   . Depression with anxiety   . Cough     Wert-onset 08/2009, as of 2014- resolved   . Deep venous thrombosis     post op, rec'd/needed  blood thinner   . Cervicalgia   . Hypothyroidism   . Blood transfusion   . Constipation   . Shortness of breath   . Anxiety   . Dysrhythmia   . Anemia   . Depression   . Lumbar radiculopathy 09/26/2011  . Headache(784.0) 09/27/2011  . Pneumonia     never been in hosp. for pneumonia   . Sleep apnea     doesn't use CPAP any longer, since weight loss  . Arthritis     hands, spine   . History of stress test     15-20 yrs.ago   . Falls frequently     pt. reports that she was falling 3 times a day, has had PT at a facility for a while & now is getting home PT 2-3 times/ week   . Right hand fracture     Monday  . Prediabetes     Past Surgical History  Procedure  Laterality Date  . Replacement total knee bilateral    . Appendectomy    . Breast excisional biopsy      breast bxs on left x 2, breast bx of right x 1-all benign  . Laparoscopic ovarian cystectomy      not done by laparscopy-abdominal incision  . Joint replacement  [id number]    bilateral  . Tonsillectomy    . Eye surgery      cataracts bilateral /w IOL  . Hernia repair      umbilical hernia- [id number]  . Back surgery      2000  . Anterior cervical decomp/discectomy fusion N/A 07/09/2013    Procedure: ANTERIOR CERVICAL DECOMPRESSION/DISCECTOMY FUSION 3 LEVELS;  Surgeon: Sinclair Ship, MD;  Location: [place];  Service: Orthopedics;  Laterality: N/A;  Anterior cervical decompression fusion, cervical 3-4, cervical 4-5-, cervical 5-6 with instrumentation, allograft.    Social History:   reports that she quit smoking about 45 years ago. Her smoking use included Cigarettes. She has a 10 pack-year smoking history. She  has never used smokeless tobacco. She reports that she drinks about 0.6 oz of alcohol per week. She reports that she does not use illicit drugs.  Family History  Problem Relation Age of Onset  . Heart disease Father   . Malignant hyperthermia Father   . Arthritis Father   . Deep vein thrombosis Son   . Diabetes Son   . Obesity Son   . Depression Son   . Hypertension Son   . Parkinson's disease Mother   . Diabetes Daughter   . Hypertension Daughter   . Anxiety disorder Daughter   . Cancer Maternal Aunt   . Hyperlipidemia Son   . Hypertension Son     Medications: Patient's Medications  New Prescriptions   No medications on file  Previous Medications   ALBUTEROL (PROVENTIL HFA;VENTOLIN HFA) 108 (90 BASE) MCG/ACT INHALER    Inhale 2 puffs into the lungs every 6 (six) hours as needed for wheezing or shortness of breath.   ARIPIPRAZOLE (ABILIFY) 10 MG TABLET    Take 10 mg by mouth daily.   BUSPIRONE (BUSPAR) 15 MG TABLET    Take 15 mg by mouth daily.   CALCIUM  CITRATE PO    Take 600 mg by mouth 2 (two) times daily.   CLONAZEPAM (KLONOPIN) 0.5 MG TABLET    Take 0.5 mg by mouth as needed for anxiety.    DULOXETINE HCL (CYMBALTA PO)    Take 120 mg by mouth daily.    FUROSEMIDE (LASIX) 40 MG TABLET    Take 1 tablet (40 mg total) by mouth daily.   LEVOTHYROXINE (SYNTHROID, LEVOTHROID) 50 MCG TABLET    Take 1 tablet (50 mcg total) by mouth every morning.   MELOXICAM (MOBIC) 15 MG TABLET    Take 1 tablet (15 mg total) by mouth daily.   MULTIPLE VITAMINS-MINERALS (MULTIVITAMIN PO)    Take 1 tablet by mouth.   OXYCODONE-ACETAMINOPHEN (PERCOCET) 10-325 MG PER TABLET    Take 1-2 tablets by mouth every 4-6 hours as needed for Arthritis pain   TRAZODONE (DESYREL) 100 MG TABLET    Take 100 mg by mouth. 4 by mouth daily   WARFARIN (COUMADIN) 5 MG TABLET    Take 5 mg by mouth at bedtime.  Modified Medications   No medications on file  Discontinued Medications   AMOXICILLIN-CLAVULANATE (AUGMENTIN) 875-125 MG PER TABLET    Take 1 tablet by mouth 2 (two) times daily.   BUSPIRONE (BUSPAR) 7.5 MG TABLET    Take 15 mg by mouth daily. 1 by mouth bid x 7 days then 15 mg daily   Physical Exam: Filed Vitals:   10/12/14 1327  BP: 132/80  Pulse: 72  Temp: 98.2 F (36.8 C)  TempSrc: Oral  Resp: 12  Height: 5\' 3"  (1.6 m)  Weight: 213 lb (96.616 kg)  SpO2: 98%  Physical Exam  Labs reviewed: Basic Metabolic Panel:  Recent Labs  02/12/14 1122 04/22/14 1135 09/21/14 1249  NA 142 140 143  K 4.4 4.5 4.6  CL 102 100 97  CO2 24 27 25   GLUCOSE 86 83 102*  BUN 26 24 31*  CREATININE 0.84 0.91 0.98  CALCIUM 8.9 9.0 10.0  TSH 1.980  --   --    Liver Function Tests:  Recent Labs  02/12/14 1122  AST 23  ALT 15  ALKPHOS 85  BILITOT <0.2  PROT 6.4   No results for input(s): LIPASE, AMYLASE in the last 8760 hours. No results for input(s): AMMONIA  in the last 8760 hours. CBC:  Recent Labs  11/23/13 1057 02/12/14 1122 04/22/14 1135  WBC 6.7 5.6 6.0    NEUTROABS  --  3.2 4.2  HGB 12.4 12.4 12.2  HCT 37.5 37.4 37.7  MCV 83.7 81 84  PLT 251 304 195   Lipid Panel:  Recent Labs  02/12/14 1122  HDL 49  LDLCALC 124*  TRIG 131  CHOLHDL 4.1   Lab Results  Component Value Date   HGBA1C 6.0* 02/12/2014   Assessment/Plan 1. Mixed urge and stress incontinence -improved with treatment of UTI -requests f/u urine-attempts to explain that this is not necessary or recommended failed so we will do a dipstick  2. Chronic pain syndrome -also improved since starting water aerobics and more walking when she was in [place] -mood also doing much better lately  3. Raynaud phenomenon -has had ongoing--asks about progression or remission and what to expect--explained that this can be uncertain  4. OSA on CPAP -has made a huge difference with sleep, mood and function  5. Hypothyroidism, unspecified hypothyroidism type -cont synthroid - TSH  6. Hyperglycemia - f/u labs, has been exercising more and more active overall - Hemoglobin R9X - Basic metabolic panel   Labs/tests ordered:urine dip per pt request,  Orders Placed This Encounter  Procedures  . Hemoglobin A1c  . Basic metabolic panel  . TSH     Next appt: 3 mos  Bulmaro Feagans L. Harrol Novello, D.O. [facility] [address], [place] [address] Cell Phone (Mon-Fri 8am-5pm):  [phone] On Call:  [phone] & follow prompts after 5pm & weekends Office Phone:  [phone] Office Fax:  [phone]

## 2014-10-13 LAB — BASIC METABOLIC PANEL
BUN/Creatinine Ratio: 29 — ABNORMAL HIGH (ref 11–26)
BUN: 31 mg/dL — ABNORMAL HIGH (ref 8–27)
CO2: 28 mmol/L (ref 18–29)
Calcium: 9.4 mg/dL (ref 8.7–10.3)
Chloride: 100 mmol/L (ref 97–108)
Creatinine, Ser: 1.07 mg/dL — ABNORMAL HIGH (ref 0.57–1.00)
GFR calc Af Amer: 58 mL/min/{1.73_m2} — ABNORMAL LOW (ref >59)
GFR calc non Af Amer: 50 mL/min/{1.73_m2} — ABNORMAL LOW (ref >59)
Glucose: 131 mg/dL — ABNORMAL HIGH (ref 65–99)
Potassium: 4.9 mmol/L (ref 3.5–5.2)
Sodium: 142 mmol/L (ref 134–144)

## 2014-10-13 LAB — HEMOGLOBIN A1C
Est. average glucose Bld gHb Est-mCnc: 123 mg/dL
Hgb A1c MFr Bld: 5.9 % — ABNORMAL HIGH (ref 4.8–5.6)

## 2014-10-13 LAB — URINE CULTURE

## 2014-10-13 LAB — TSH: TSH: 1.09 u[IU]/mL (ref 0.450–4.500)

## 2014-10-14 ENCOUNTER — Encounter: Payer: Self-pay | Admitting: *Deleted

## 2014-10-14 ENCOUNTER — Other Ambulatory Visit: Payer: Self-pay | Admitting: *Deleted

## 2014-10-14 DIAGNOSIS — R319 Hematuria, unspecified: Secondary | ICD-10-CM

## 2014-10-16 DIAGNOSIS — M545 Low back pain: Secondary | ICD-10-CM | POA: Diagnosis not present

## 2014-10-20 ENCOUNTER — Other Ambulatory Visit: Payer: Self-pay | Admitting: Internal Medicine

## 2014-10-23 ENCOUNTER — Other Ambulatory Visit: Payer: Self-pay | Admitting: Internal Medicine

## 2014-10-26 ENCOUNTER — Encounter: Payer: Self-pay | Admitting: Pharmacotherapy

## 2014-10-26 ENCOUNTER — Ambulatory Visit (INDEPENDENT_AMBULATORY_CARE_PROVIDER_SITE_OTHER): Payer: Medicare Other | Admitting: Pharmacotherapy

## 2014-10-26 VITALS — BP 142/80 | HR 67 | Temp 98.4°F | Resp 18 | Ht 63.0 in | Wt 210.4 lb

## 2014-10-26 DIAGNOSIS — I82409 Acute embolism and thrombosis of unspecified deep veins of unspecified lower extremity: Secondary | ICD-10-CM | POA: Diagnosis not present

## 2014-10-26 LAB — POCT INR: INR: 1.4

## 2014-10-26 NOTE — Patient Instructions (Signed)
INR 1.4 due to missed doses. Today take 1 & 1/2 tablet daily Then resume 5mg  daily

## 2014-10-26 NOTE — Progress Notes (Signed)
   Subjective:    Patient ID: Andrea [name], female    DOB: 14-Jul-1935, 79 y.o.   MRN: [id number]  HPI Last INR was OK at 2.6 Current Coumadin dose is 5mg  daily. Did miss at least 2 doses over the weekend. Denies unusual bleeding or bruising. Denies CP, SOB, falls Consistent with vitamin K intake   Review of Systems  HENT: Negative for nosebleeds.   Respiratory: Negative for shortness of breath.   Cardiovascular: Negative for chest pain.  Gastrointestinal: Negative for blood in stool and anal bleeding.  Genitourinary: Negative for hematuria.  Hematological: Bruises/bleeds easily.       Objective:   Physical Exam  Constitutional: She is oriented to person, place, and time. She appears well-developed and well-nourished.  HENT:  Right Ear: External ear normal.  Left Ear: External ear normal.  Cardiovascular: Normal rate, regular rhythm and normal heart sounds.   Pulmonary/Chest: Effort normal and breath sounds normal.  Neurological: She is alert and oriented to person, place, and time.  Skin: Skin is warm and dry.  Psychiatric: She has a normal mood and affect. Her behavior is normal. Judgment and thought content normal.  Vitals reviewed.  BP:  142/80  HR: 67 wt:  210lb   INR 1.4     Assessment & Plan:  1.  INR below goal due to missed doses. 2.  Coumadin 7.5mg  today, then resume 5mg  QD 3.  INR 1 month

## 2014-11-06 DIAGNOSIS — M6281 Muscle weakness (generalized): Secondary | ICD-10-CM | POA: Diagnosis not present

## 2014-11-09 ENCOUNTER — Other Ambulatory Visit: Payer: Self-pay | Admitting: *Deleted

## 2014-11-09 DIAGNOSIS — I82409 Acute embolism and thrombosis of unspecified deep veins of unspecified lower extremity: Secondary | ICD-10-CM

## 2014-11-09 MED ORDER — WARFARIN SODIUM 5 MG PO TABS: 5.0000 mg | ORAL_TABLET | Freq: Every day | ORAL | Status: DC

## 2014-11-09 NOTE — Telephone Encounter (Signed)
Andrea [name] requested Rx to be faxed to pharmacy

## 2014-11-10 DIAGNOSIS — M545 Low back pain: Secondary | ICD-10-CM | POA: Diagnosis not present

## 2014-11-10 DIAGNOSIS — M542 Cervicalgia: Secondary | ICD-10-CM | POA: Diagnosis not present

## 2014-11-10 DIAGNOSIS — F3181 Bipolar II disorder: Secondary | ICD-10-CM | POA: Diagnosis not present

## 2014-11-11 DIAGNOSIS — N3281 Overactive bladder: Secondary | ICD-10-CM | POA: Diagnosis not present

## 2014-11-15 ENCOUNTER — Other Ambulatory Visit: Payer: Self-pay | Admitting: Internal Medicine

## 2014-11-16 ENCOUNTER — Encounter: Payer: Self-pay | Admitting: Pharmacotherapy

## 2014-11-16 ENCOUNTER — Ambulatory Visit (INDEPENDENT_AMBULATORY_CARE_PROVIDER_SITE_OTHER): Payer: Medicare Other | Admitting: Pharmacotherapy

## 2014-11-16 VITALS — BP 126/80 | HR 52 | Temp 98.2°F | Resp 10 | Ht 67.0 in | Wt 208.0 lb

## 2014-11-16 DIAGNOSIS — I82409 Acute embolism and thrombosis of unspecified deep veins of unspecified lower extremity: Secondary | ICD-10-CM

## 2014-11-16 LAB — POCT INR: INR: 1.4

## 2014-11-16 MED ORDER — WARFARIN SODIUM 5 MG PO TABS: ORAL_TABLET | ORAL | Status: DC

## 2014-11-16 NOTE — Patient Instructions (Signed)
INR 1.4 Increase Coumadin 1 tablet (5mg ) daily except 1&/12 tablet (7.5mg ) on Mondays and Fridays

## 2014-11-16 NOTE — Progress Notes (Signed)
   Subjective:    Patient ID: Andrea [name], female    DOB: [birth year], 79 y.o.   MRN: [id number]  HPI Last INR was low.  INR is low again today. Current Coumadin dose is 5mg  daily Denies missed doses. Denies unusual bleeding or bruising. Denies CP, SOB, falls Consistent with vitamin K intake.   Review of Systems  HENT: Negative for nosebleeds.   Respiratory: Negative for shortness of breath.   Cardiovascular: Negative for chest pain.  Gastrointestinal: Negative for blood in stool and anal bleeding.  Genitourinary: Negative for hematuria.  Hematological: Does not bruise/bleed easily.       Objective:   Physical Exam  Constitutional: She is oriented to person, place, and time. She appears well-developed and well-nourished.  HENT:  Right Ear: External ear normal.  Left Ear: External ear normal.  Cardiovascular: Normal rate, regular rhythm and normal heart sounds.   Pulmonary/Chest: Effort normal and breath sounds normal.  Neurological: She is alert and oriented to person, place, and time.  Skin: Skin is warm and dry.  Psychiatric: She has a normal mood and affect. Her behavior is normal. Judgment and thought content normal.  Vitals reviewed.   BP:  126/80   HR:  52   Wt:  208lb  INR 1.4      Assessment & Plan:  1.  INR below goal 2-3 2.  Increase Coumadin 5mg  QD except 7.5mg  M/F 3.  RTC in 2 weeks

## 2014-11-17 DIAGNOSIS — M545 Low back pain: Secondary | ICD-10-CM | POA: Diagnosis not present

## 2014-11-17 DIAGNOSIS — M542 Cervicalgia: Secondary | ICD-10-CM | POA: Diagnosis not present

## 2014-11-18 DIAGNOSIS — M545 Low back pain: Secondary | ICD-10-CM | POA: Diagnosis not present

## 2014-11-18 DIAGNOSIS — M542 Cervicalgia: Secondary | ICD-10-CM | POA: Diagnosis not present

## 2014-11-23 ENCOUNTER — Ambulatory Visit: Payer: Medicare Other | Admitting: Pharmacotherapy

## 2014-11-23 DIAGNOSIS — R262 Difficulty in walking, not elsewhere classified: Secondary | ICD-10-CM | POA: Diagnosis not present

## 2014-11-23 DIAGNOSIS — M6281 Muscle weakness (generalized): Secondary | ICD-10-CM | POA: Diagnosis not present

## 2014-11-23 DIAGNOSIS — M545 Low back pain: Secondary | ICD-10-CM | POA: Diagnosis not present

## 2014-11-26 DIAGNOSIS — M545 Low back pain: Secondary | ICD-10-CM | POA: Diagnosis not present

## 2014-11-26 DIAGNOSIS — R262 Difficulty in walking, not elsewhere classified: Secondary | ICD-10-CM | POA: Diagnosis not present

## 2014-11-26 DIAGNOSIS — M6281 Muscle weakness (generalized): Secondary | ICD-10-CM | POA: Diagnosis not present

## 2014-11-30 ENCOUNTER — Encounter: Payer: Self-pay | Admitting: Pharmacotherapy

## 2014-11-30 ENCOUNTER — Ambulatory Visit (INDEPENDENT_AMBULATORY_CARE_PROVIDER_SITE_OTHER): Payer: Medicare Other | Admitting: Pharmacotherapy

## 2014-11-30 VITALS — BP 136/84 | HR 73 | Temp 97.8°F | Wt 208.0 lb

## 2014-11-30 DIAGNOSIS — I82409 Acute embolism and thrombosis of unspecified deep veins of unspecified lower extremity: Secondary | ICD-10-CM

## 2014-11-30 DIAGNOSIS — M545 Low back pain: Secondary | ICD-10-CM | POA: Diagnosis not present

## 2014-11-30 DIAGNOSIS — M6281 Muscle weakness (generalized): Secondary | ICD-10-CM | POA: Diagnosis not present

## 2014-11-30 DIAGNOSIS — R262 Difficulty in walking, not elsewhere classified: Secondary | ICD-10-CM | POA: Diagnosis not present

## 2014-11-30 DIAGNOSIS — R32 Unspecified urinary incontinence: Secondary | ICD-10-CM

## 2014-11-30 LAB — POCT INR: INR: 3.3

## 2014-11-30 MED ORDER — WARFARIN SODIUM 5 MG PO TABS
ORAL_TABLET | ORAL | Status: DC
Start: 2014-11-30 — End: 2015-01-11

## 2014-11-30 NOTE — Patient Instructions (Signed)
INR 3.3 Decrease Coumadin 5mg  daily except 7.5mg  on Fridays only

## 2014-11-30 NOTE — Progress Notes (Signed)
   Subjective:    Patient ID: Andrea [name], female    DOB: Dec 04, 1934, 80 y.o.   MRN: [id number]  HPI Last INR was low at 1.4 Coumadin was increased to 5mg  QD except 7.5mg  M/F Denies missed doses. Denies unusual bleeding or bruising. Denies CP, SOB, falls Consistent with vitamin K intake   Review of Systems  HENT: Negative for nosebleeds.   Respiratory: Negative for shortness of breath.   Cardiovascular: Negative for chest pain.  Gastrointestinal: Negative for blood in stool and anal bleeding.  Genitourinary: Negative for hematuria.       Objective:   Physical Exam  Constitutional: She is oriented to person, place, and time. She appears well-developed and well-nourished.  HENT:  Right Ear: External ear normal.  Left Ear: External ear normal.  Cardiovascular: Normal rate, regular rhythm and normal heart sounds.   Pulmonary/Chest: Effort normal and breath sounds normal.  Neurological: She is alert and oriented to person, place, and time.  Skin: Skin is warm and dry.  Psychiatric: She has a normal mood and affect. Her behavior is normal. Judgment and thought content normal.  Vitals reviewed.   BP:  126/84 HR: 73  Wt:  208lb INR 3.3       Assessment & Plan:  1.  INR above goal 2-3 2.  Decrease Coumadin 5mg  QD except 7.5mg  Fridays 3.  RTC in 3 weeks

## 2014-12-03 DIAGNOSIS — M545 Low back pain: Secondary | ICD-10-CM | POA: Diagnosis not present

## 2014-12-03 DIAGNOSIS — M6281 Muscle weakness (generalized): Secondary | ICD-10-CM | POA: Diagnosis not present

## 2014-12-03 DIAGNOSIS — R262 Difficulty in walking, not elsewhere classified: Secondary | ICD-10-CM | POA: Diagnosis not present

## 2014-12-07 DIAGNOSIS — R262 Difficulty in walking, not elsewhere classified: Secondary | ICD-10-CM | POA: Diagnosis not present

## 2014-12-07 DIAGNOSIS — M6281 Muscle weakness (generalized): Secondary | ICD-10-CM | POA: Diagnosis not present

## 2014-12-07 DIAGNOSIS — M545 Low back pain: Secondary | ICD-10-CM | POA: Diagnosis not present

## 2014-12-09 DIAGNOSIS — R262 Difficulty in walking, not elsewhere classified: Secondary | ICD-10-CM | POA: Diagnosis not present

## 2014-12-09 DIAGNOSIS — M545 Low back pain: Secondary | ICD-10-CM | POA: Diagnosis not present

## 2014-12-09 DIAGNOSIS — M6281 Muscle weakness (generalized): Secondary | ICD-10-CM | POA: Diagnosis not present

## 2014-12-10 DIAGNOSIS — N390 Urinary tract infection, site not specified: Secondary | ICD-10-CM | POA: Diagnosis not present

## 2014-12-10 DIAGNOSIS — B961 Klebsiella pneumoniae [K. pneumoniae] as the cause of diseases classified elsewhere: Secondary | ICD-10-CM | POA: Diagnosis not present

## 2014-12-17 DIAGNOSIS — M545 Low back pain: Secondary | ICD-10-CM | POA: Diagnosis not present

## 2014-12-17 DIAGNOSIS — R262 Difficulty in walking, not elsewhere classified: Secondary | ICD-10-CM | POA: Diagnosis not present

## 2014-12-17 DIAGNOSIS — M6281 Muscle weakness (generalized): Secondary | ICD-10-CM | POA: Diagnosis not present

## 2014-12-18 DIAGNOSIS — M545 Low back pain: Secondary | ICD-10-CM | POA: Diagnosis not present

## 2014-12-21 ENCOUNTER — Ambulatory Visit (INDEPENDENT_AMBULATORY_CARE_PROVIDER_SITE_OTHER): Payer: Medicare Other | Admitting: Pharmacotherapy

## 2014-12-21 VITALS — BP 130/78 | HR 70 | Temp 98.4°F | Resp 20 | Wt 203.5 lb

## 2014-12-21 DIAGNOSIS — I82409 Acute embolism and thrombosis of unspecified deep veins of unspecified lower extremity: Secondary | ICD-10-CM | POA: Diagnosis not present

## 2014-12-21 LAB — POCT INR: INR: 3.7

## 2014-12-21 NOTE — Progress Notes (Signed)
   Subjective:    Patient ID: Andrea [name], female    DOB: 1935/06/27, 80 y.o.   MRN: [id number]  HPI Last INR was high at 3.3 Coumadin was decreased to 5mg  QD except 7.5mg  Fridays She has been started on Keflex QID for UTI - finishes today. Denies missed doses Denies unusual bleeding or bruising. Denies CP, SOB, falls. Consistent with vitamin K intake.  Review of Systems  Constitutional: Positive for fatigue.  HENT: Negative for nosebleeds.   Respiratory: Negative for shortness of breath.   Cardiovascular: Negative for chest pain.  Gastrointestinal: Negative for blood in stool and anal bleeding.  Genitourinary: Negative for hematuria.  Hematological: Does not bruise/bleed easily.       Objective:   Physical Exam  Constitutional: She is oriented to person, place, and time. She appears well-developed and well-nourished.  HENT:  Right Ear: External ear normal.  Left Ear: External ear normal.  Cardiovascular: Normal rate, regular rhythm and normal heart sounds.   Pulmonary/Chest: Effort normal and breath sounds normal.  Neurological: She is alert and oriented to person, place, and time.  Skin: Skin is warm and dry.  Psychiatric: She has a normal mood and affect. Her behavior is normal. Judgment and thought content normal.  Vitals reviewed.  BP:  130/78   HR:  70  Wt:  203.8  INR 3.7        Assessment & Plan:  1.  INR above goal 2-3 due to interaction between warfarin and cephalexin. 2.  Hold Coumadin today. 3.  Then start Coumadin 5mg  QD except 7.5mg  Fridays. 4.  RTC in 2 weeks.

## 2014-12-21 NOTE — Patient Instructions (Signed)
INR 3.7 No Coumadin today, then start Coumadin 5mg  daily except 7.5mg  on Fridays

## 2014-12-23 DIAGNOSIS — M545 Low back pain: Secondary | ICD-10-CM | POA: Diagnosis not present

## 2014-12-23 DIAGNOSIS — M6281 Muscle weakness (generalized): Secondary | ICD-10-CM | POA: Diagnosis not present

## 2014-12-23 DIAGNOSIS — R262 Difficulty in walking, not elsewhere classified: Secondary | ICD-10-CM | POA: Diagnosis not present

## 2014-12-28 DIAGNOSIS — F3181 Bipolar II disorder: Secondary | ICD-10-CM | POA: Diagnosis not present

## 2015-01-04 ENCOUNTER — Ambulatory Visit: Payer: Medicare Other | Admitting: Pharmacotherapy

## 2015-01-06 DIAGNOSIS — M6281 Muscle weakness (generalized): Secondary | ICD-10-CM | POA: Diagnosis not present

## 2015-01-06 DIAGNOSIS — M545 Low back pain: Secondary | ICD-10-CM | POA: Diagnosis not present

## 2015-01-06 DIAGNOSIS — R262 Difficulty in walking, not elsewhere classified: Secondary | ICD-10-CM | POA: Diagnosis not present

## 2015-01-11 ENCOUNTER — Ambulatory Visit (INDEPENDENT_AMBULATORY_CARE_PROVIDER_SITE_OTHER): Payer: Medicare Other | Admitting: Pharmacotherapy

## 2015-01-11 ENCOUNTER — Encounter: Payer: Self-pay | Admitting: Pharmacotherapy

## 2015-01-11 VITALS — BP 120/68 | HR 82 | Temp 98.6°F | Resp 20 | Ht 67.0 in | Wt 205.0 lb

## 2015-01-11 DIAGNOSIS — I82409 Acute embolism and thrombosis of unspecified deep veins of unspecified lower extremity: Secondary | ICD-10-CM

## 2015-01-11 LAB — POCT INR: INR: 4

## 2015-01-11 MED ORDER — WARFARIN SODIUM 5 MG PO TABS: 5.0000 mg | ORAL_TABLET | Freq: Every day | ORAL | Status: DC

## 2015-01-11 NOTE — Progress Notes (Signed)
   Subjective:    Patient ID: Andrea [name], female    DOB: 10-22-1935, 80 y.o.   MRN: [id number]  HPI Last INR was high at 3.7 Coumadin was held, then reduced to 5mg  QD except 7.5mg  Fridays Denies missed doses. Denies unusual bleeding or bruising  Denies CP, SOB, falls Less vitamin intake.   Review of Systems  HENT: Negative for nosebleeds.   Respiratory: Negative for shortness of breath.   Cardiovascular: Negative for chest pain.  Gastrointestinal: Negative for blood in stool and anal bleeding.  Genitourinary: Negative for hematuria.  Hematological: Does not bruise/bleed easily.       Objective:   Physical Exam  Constitutional: She is oriented to person, place, and time. She appears well-developed and well-nourished.  HENT:  Right Ear: External ear normal.  Left Ear: External ear normal.  Cardiovascular: Normal rate, regular rhythm and normal heart sounds.   Pulmonary/Chest: Effort normal and breath sounds normal.  Neurological: She is alert and oriented to person, place, and time.  Skin: Skin is warm and dry.  Psychiatric: She has a normal mood and affect. Her behavior is normal. Judgment and thought content normal.  Vitals reviewed.  BP:  120/68   HR:  82  Wt:  205lb INR 4.0        Assessment & Plan:  1.  INR above goal 2-3 2.  No Coumadin today 3.  Then start Coumadin 5mg  QD 4.  Eat vitamin K foods today 5. RTC 2 weeks

## 2015-01-11 NOTE — Patient Instructions (Signed)
INR 4.0 Eat spinach No Coumadin today Then start Coumadin 5mg  daily

## 2015-01-14 ENCOUNTER — Encounter: Payer: Self-pay | Admitting: Internal Medicine

## 2015-01-14 ENCOUNTER — Ambulatory Visit (INDEPENDENT_AMBULATORY_CARE_PROVIDER_SITE_OTHER): Payer: Medicare Other | Admitting: Internal Medicine

## 2015-01-14 VITALS — BP 130/64 | HR 73 | Temp 98.4°F | Resp 20 | Ht 67.0 in | Wt 205.6 lb

## 2015-01-14 DIAGNOSIS — G4733 Obstructive sleep apnea (adult) (pediatric): Secondary | ICD-10-CM

## 2015-01-14 DIAGNOSIS — E039 Hypothyroidism, unspecified: Secondary | ICD-10-CM

## 2015-01-14 DIAGNOSIS — Z23 Encounter for immunization: Secondary | ICD-10-CM

## 2015-01-14 DIAGNOSIS — G894 Chronic pain syndrome: Secondary | ICD-10-CM | POA: Diagnosis not present

## 2015-01-14 DIAGNOSIS — N3946 Mixed incontinence: Secondary | ICD-10-CM | POA: Diagnosis not present

## 2015-01-14 DIAGNOSIS — R739 Hyperglycemia, unspecified: Secondary | ICD-10-CM | POA: Diagnosis not present

## 2015-01-14 DIAGNOSIS — F329 Major depressive disorder, single episode, unspecified: Secondary | ICD-10-CM | POA: Diagnosis not present

## 2015-01-14 DIAGNOSIS — F32A Depression, unspecified: Secondary | ICD-10-CM

## 2015-01-14 DIAGNOSIS — I4891 Unspecified atrial fibrillation: Secondary | ICD-10-CM | POA: Diagnosis not present

## 2015-01-14 DIAGNOSIS — Z9989 Dependence on other enabling machines and devices: Secondary | ICD-10-CM

## 2015-01-14 MED ORDER — MELOXICAM 15 MG PO TABS: 15.0000 mg | ORAL_TABLET | Freq: Every day | ORAL | Status: DC

## 2015-01-14 MED ORDER — OXYCODONE-ACETAMINOPHEN 10-325 MG PO TABS: ORAL_TABLET | ORAL | Status: DC

## 2015-01-14 NOTE — Patient Instructions (Signed)
Please bring [place] a copy of your living will next time.

## 2015-01-14 NOTE — Progress Notes (Signed)
Patient ID: Andrea [name], female   DOB: 27-Sep-1935, 80 y.o.   MRN: [id number]   Location:  [facility]  Code Status: DNR  Allergies  Allergen Reactions  . Clindamycin Hcl Other (See Comments)    REACTION: swelling, pt. Reports that she had a 16 lb. weightgain in one day    Chief Complaint  Patient presents with  . Medical Management of Chronic Issues    HPI: Patient is a 80 y.o. female seen in the office today for management of chronic medical issues.  Her right big toe has been hurting for a week. At night only. excrutiating pain. No swelling or redness. Nothing makes it worse. She has taken her Percocet for the pain.   The urologist started her on Myrbetriq. It is a miracle. She did not even get up last night to go to the bathroom.   Mood is stable. Occasionally anxiety, but clonazepam gives her relief.  Being followed by [facility] for coumadin for atrial fib. Denies chest pain, palpatations, syncope, abnormal bleeding.  Still using her CPAP most nights. Sleeping well.   Review of Systems:  Review of Systems  Respiratory: Negative for cough, hemoptysis, sputum production, shortness of breath and wheezing.   Cardiovascular: Negative for chest pain, palpitations and orthopnea.  Gastrointestinal: Negative for heartburn, nausea, vomiting, abdominal pain, diarrhea and constipation.  Genitourinary: Negative for dysuria, urgency, frequency and hematuria.  Musculoskeletal: Positive for back pain and joint pain.  Neurological: Negative for headaches.    Past Medical History  Diagnosis Date  . Hypotension   . Depression with anxiety   . Cough     Wert-onset 08/2009, as of 2014- resolved   . Deep venous thrombosis     post op, rec'd/needed  blood thinner   . Cervicalgia   . Hypothyroidism   . Blood transfusion   . Constipation   . Shortness of breath   . Anxiety   . Dysrhythmia   . Anemia   . Depression   . Lumbar radiculopathy  09/26/2011  . Headache(784.0) 09/27/2011  . Pneumonia     never been in hosp. for pneumonia   . Sleep apnea     doesn't use CPAP any longer, since weight loss  . Arthritis     hands, spine   . History of stress test     15-20 yrs.ago   . Falls frequently     pt. reports that she was falling 3 times a day, has had PT at a facility for a while & now is getting home PT 2-3 times/ week   . Right hand fracture     Monday  . Prediabetes     Past Surgical History  Procedure Laterality Date  . Replacement total knee bilateral    . Appendectomy    . Breast excisional biopsy      breast bxs on left x 2, breast bx of right x 1-all benign  . Laparoscopic ovarian cystectomy      not done by laparscopy-abdominal incision  . Joint replacement  [id number]    bilateral  . Tonsillectomy    . Eye surgery      cataracts bilateral /w IOL  . Hernia repair      umbilical hernia- [id number]  . Back surgery      2000  . Anterior cervical decomp/discectomy fusion N/A 07/09/2013    Procedure: ANTERIOR CERVICAL DECOMPRESSION/DISCECTOMY FUSION 3 LEVELS;  Surgeon: Sinclair Ship, MD;  Location: [place];  Service: Orthopedics;  Laterality: N/A;  Anterior cervical decompression fusion, cervical 3-4, cervical 4-5-, cervical 5-6 with instrumentation, allograft.    Social History:   reports that she quit smoking about 46 years ago. Her smoking use included Cigarettes. She has a 10 pack-year smoking history. She has never used smokeless tobacco. She reports that she drinks about 0.6 oz of alcohol per week. She reports that she does not use illicit drugs.  Family History  Problem Relation Age of Onset  . Heart disease Father   . Malignant hyperthermia Father   . Arthritis Father   . Deep vein thrombosis Son   . Diabetes Son   . Obesity Son   . Depression Son   . Hypertension Son   . Parkinson's disease Mother   . Diabetes Daughter   . Hypertension Daughter   . Anxiety disorder Daughter   . Cancer  Maternal Aunt   . Hyperlipidemia Son   . Hypertension Son     Medications: Patient's Medications  New Prescriptions   No medications on file  Previous Medications   ARIPIPRAZOLE (ABILIFY) 10 MG TABLET    Take 10 mg by mouth daily.   BUSPIRONE (BUSPAR) 15 MG TABLET    Take 15 mg by mouth daily.   CALCIUM CITRATE PO    Take 600 mg by mouth 2 (two) times daily.   CLONAZEPAM (KLONOPIN) 0.5 MG TABLET    Take 0.5 mg by mouth as needed for anxiety.    DULOXETINE HCL (CYMBALTA PO)    Take 60 mg by mouth 2 (two) times daily.    FUROSEMIDE (LASIX) 40 MG TABLET    TAKE 1 TABLET (40 MG TOTAL) BY MOUTH DAILY.   LEVOTHYROXINE (SYNTHROID, LEVOTHROID) 50 MCG TABLET    TAKE 1 TABLET (50 MCG TOTAL) BY MOUTH EVERY MORNING.   MELOXICAM (MOBIC) 15 MG TABLET    TAKE 1 TABLET (15 MG TOTAL) BY MOUTH DAILY.   MIRABEGRON ER (MYRBETRIQ) 25 MG TB24 TABLET    Take 25 mg by mouth daily.   MIRTAZAPINE (REMERON) 15 MG TABLET    Take 15 mg by mouth at bedtime.   MULTIPLE VITAMINS-MINERALS (MULTIVITAMIN PO)    Take 1 tablet by mouth.   OXYCODONE-ACETAMINOPHEN (PERCOCET) 10-325 MG PER TABLET    Take 1-2 tablets by mouth every 4-6 hours as needed for Arthritis pain   TRAZODONE (DESYREL) 100 MG TABLET    Take 100 mg by mouth. 4 by mouth daily   WARFARIN (COUMADIN) 5 MG TABLET    Take 1 tablet (5 mg total) by mouth daily. Take 1 tablet daily except 1&1/2 tablets on  Fridays  Modified Medications   No medications on file  Discontinued Medications   No medications on file     Physical Exam: Filed Vitals:   01/14/15 1434  BP: 130/64  Pulse: 73  Temp: 98.4 F (36.9 C)  TempSrc: Oral  Resp: 20  Height: 5\' 7"  (1.702 m)  Weight: 205 lb 9.6 oz (93.26 kg)  SpO2: 96%   Physical Exam  Constitutional: She appears well-developed and well-nourished.  HENT:  Head: Normocephalic and atraumatic.  Cardiovascular: Normal rate, regular rhythm, normal heart sounds and intact distal pulses.  Exam reveals no gallop and no  friction rub.   No murmur heard. Pulmonary/Chest: Effort normal and breath sounds normal. No respiratory distress. She has no wheezes. She has no rales. She exhibits no tenderness.  Abdominal: Soft. Bowel sounds are normal. She exhibits no distension. There is no tenderness.  Musculoskeletal:  Refused examination of her toe  Neurological: She is alert.  Skin: Skin is warm and dry.  Psychiatric: She has a normal mood and affect. Her behavior is normal.  Vitals reviewed.   Labs reviewed: Basic Metabolic Panel:  Recent Labs  02/12/14 1122 04/22/14 1135 09/21/14 1249 10/12/14 1407  NA 142 140 143 142  K 4.4 4.5 4.6 4.9  CL 102 100 97 100  CO2 24 27 25 28   GLUCOSE 86 83 102* 131*  BUN 26 24 31* 31*  CREATININE 0.84 0.91 0.98 1.07*  CALCIUM 8.9 9.0 10.0 9.4  TSH 1.980  --   --  1.090   Liver Function Tests:  Recent Labs  02/12/14 1122  AST 23  ALT 15  ALKPHOS 85  BILITOT <0.2  PROT 6.4   No results for input(s): LIPASE, AMYLASE in the last 8760 hours. No results for input(s): AMMONIA in the last 8760 hours. CBC:  Recent Labs  02/12/14 1122 04/22/14 1135  WBC 5.6 6.0  NEUTROABS 3.2 4.2  HGB 12.4 12.2  HCT 37.4 37.7  MCV 81 84  PLT 304 195   Lipid Panel:  Recent Labs  02/12/14 1122  HDL 49  LDLCALC 124*  TRIG 131  CHOLHDL 4.1   Lab Results  Component Value Date   HGBA1C 5.9* 10/12/2014   Assessment/Plan 1. Mixed incontinence Improved. Continue Myrbretriq and follow with urologist.   2. Clinical depression Stable. Continue Abilify, Cymbalta, and Remeron.  3. Chronic pain syndrome Controlled with oxycodone and meloxicam. Needed refills today.  Doing much better recently--much more mobile and mood much better.  4. Hypothyroidism, unspecified hypothyroidism type Stable. Continue synthroid.  Lab Results  Component Value Date   TSH 1.090 10/12/2014   5. OSA on CPAP Stable. Continue to use CPAP.   6. Atrial Fibrillation Controlled.  Anticoagulated with coumadin.  Follows with Tivis Ringer.  INR goal 2-3.    7.  Need for 13 valent pneumonia vaccine:  prevnar given  8.  Hyperglycemia:  Check hba1c before next visit, encouraged to follow low sweets and carb diet  Labs/tests ordered:   Orders Placed This Encounter  Procedures  . CBC with Differential/Platelet    Standing Status: Future     Number of Occurrences:      Standing Expiration Date: 07/15/2015  . Comprehensive metabolic panel    Standing Status: Future     Number of Occurrences:      Standing Expiration Date: 07/15/2015    Order Specific Question:  Has the patient fasted?    Answer:  Yes  . Hemoglobin A1c    Standing Status: Future     Number of Occurrences:      Standing Expiration Date: 07/15/2015  . TSH    Standing Status: Future     Number of Occurrences:      Standing Expiration Date: 07/15/2015    Next appt:  3 mos labs before  Malyah Ohlrich L. Herbie Lehrmann, D.O. [facility] [address], [place] [address] Cell Phone (Mon-Fri 8am-5pm):  [phone] On Call:  [phone] & follow prompts after 5pm & weekends Office Phone:  [phone] Office Fax:  [phone]

## 2015-01-19 ENCOUNTER — Other Ambulatory Visit: Payer: Self-pay | Admitting: Internal Medicine

## 2015-01-25 ENCOUNTER — Ambulatory Visit (INDEPENDENT_AMBULATORY_CARE_PROVIDER_SITE_OTHER): Payer: Medicare Other | Admitting: Pharmacotherapy

## 2015-01-25 ENCOUNTER — Encounter: Payer: Self-pay | Admitting: Pharmacotherapy

## 2015-01-25 VITALS — BP 130/82 | HR 64 | Temp 97.8°F | Wt 204.4 lb

## 2015-01-25 DIAGNOSIS — I4891 Unspecified atrial fibrillation: Secondary | ICD-10-CM

## 2015-01-25 DIAGNOSIS — I82409 Acute embolism and thrombosis of unspecified deep veins of unspecified lower extremity: Secondary | ICD-10-CM | POA: Diagnosis not present

## 2015-01-25 LAB — POCT INR: INR: 2.5

## 2015-01-25 NOTE — Patient Instructions (Signed)
INR 2.5 Continue Coumadin 5mg  daily

## 2015-01-25 NOTE — Progress Notes (Signed)
   Subjective:    Patient ID: Andrea [name], female    DOB: 1935/10/23, 80 y.o.   MRN: [id number]  HPI Last INR on 01/11/15 was high at 4.0. Coumadin was held, then restarted at 5mg  daily. Denies missed doses. Denies unusual bleeding or bruising. Denies CP, palpitations, falls Consistent with vitamin K intake.   Review of Systems  HENT: Negative for nosebleeds.   Respiratory: Negative for shortness of breath.   Cardiovascular: Negative for chest pain and palpitations.  Gastrointestinal: Negative for blood in stool and anal bleeding.  Genitourinary: Negative for hematuria.  Hematological: Does not bruise/bleed easily.       Objective:   Physical Exam  Constitutional: She is oriented to person, place, and time. She appears well-developed and well-nourished.  HENT:  Right Ear: External ear normal.  Left Ear: External ear normal.  Eyes:  Wears glasses  Cardiovascular: Normal rate, regular rhythm and normal heart sounds.   Pulmonary/Chest: Effort normal and breath sounds normal.  Neurological: She is alert and oriented to person, place, and time.  Skin: Skin is warm and dry.  Psychiatric: She has a normal mood and affect. Her behavior is normal. Judgment and thought content normal.    BP:  132/81  HR:  64  Wt:  204.4lb INR 2.5      Assessment & Plan:  1.  INR at goal 2-3 2.  Continue Coumadin 5mg  daily 3.  RTC in 1 month

## 2015-02-08 DIAGNOSIS — F3181 Bipolar II disorder: Secondary | ICD-10-CM | POA: Diagnosis not present

## 2015-03-01 ENCOUNTER — Ambulatory Visit (INDEPENDENT_AMBULATORY_CARE_PROVIDER_SITE_OTHER): Payer: Medicare Other | Admitting: Pharmacotherapy

## 2015-03-01 ENCOUNTER — Encounter: Payer: Self-pay | Admitting: Pharmacotherapy

## 2015-03-01 VITALS — BP 120/78 | HR 81 | Temp 98.1°F | Resp 20 | Ht 67.0 in | Wt 200.4 lb

## 2015-03-01 DIAGNOSIS — I82409 Acute embolism and thrombosis of unspecified deep veins of unspecified lower extremity: Secondary | ICD-10-CM

## 2015-03-01 DIAGNOSIS — I4891 Unspecified atrial fibrillation: Secondary | ICD-10-CM

## 2015-03-01 LAB — POCT INR: INR: 2.6

## 2015-03-01 NOTE — Progress Notes (Signed)
   Subjective:    Patient ID: Andrea [name], female    DOB: 1935/06/11, 80 y.o.   MRN: [id number]  HPI Last INR on 01/25/15 was OK at 2.5 Current Coumadin dose is 5mg  QD. Did miss a dose last week. Denies CP, palpitations,falls Consistent with vitamin K intake. Denies unusual bleeding or bruising.   Review of Systems  HENT: Negative for nosebleeds.   Respiratory: Negative for shortness of breath.   Cardiovascular: Negative for chest pain and palpitations.  Gastrointestinal: Negative for blood in stool and anal bleeding.  Genitourinary: Negative for hematuria.  Hematological: Does not bruise/bleed easily.       Objective:   Physical Exam  Constitutional: She is oriented to person, place, and time. She appears well-developed and well-nourished.  HENT:  Right Ear: External ear normal.  Left Ear: External ear normal.  Cardiovascular: Normal rate, regular rhythm and normal heart sounds.   Pulmonary/Chest: Effort normal and breath sounds normal.  Neurological: She is alert and oriented to person, place, and time.  Skin: Skin is warm and dry.  Psychiatric: She has a normal mood and affect. Her behavior is normal. Judgment and thought content normal.  Vitals reviewed.   BP:  120/78  HR 81  Wt:  200lb INR 2.6       Assessment & Plan:  1.  INR at goal 2-3 2.  Continue Coumadin 5mg  QD 3. RTC in 1 month

## 2015-03-01 NOTE — Patient Instructions (Signed)
INR 2.6 Continue Coumadin 5mg  daily

## 2015-03-11 DIAGNOSIS — I739 Peripheral vascular disease, unspecified: Secondary | ICD-10-CM | POA: Diagnosis not present

## 2015-03-11 DIAGNOSIS — L84 Corns and callosities: Secondary | ICD-10-CM | POA: Diagnosis not present

## 2015-03-11 DIAGNOSIS — L603 Nail dystrophy: Secondary | ICD-10-CM | POA: Diagnosis not present

## 2015-03-24 DIAGNOSIS — H04123 Dry eye syndrome of bilateral lacrimal glands: Secondary | ICD-10-CM | POA: Diagnosis not present

## 2015-03-29 ENCOUNTER — Ambulatory Visit (INDEPENDENT_AMBULATORY_CARE_PROVIDER_SITE_OTHER): Payer: Medicare Other | Admitting: Pharmacotherapy

## 2015-03-29 ENCOUNTER — Encounter: Payer: Self-pay | Admitting: Pharmacotherapy

## 2015-03-29 VITALS — BP 100/78 | HR 59 | Temp 98.3°F | Resp 20 | Ht 67.0 in | Wt 204.0 lb

## 2015-03-29 DIAGNOSIS — I82409 Acute embolism and thrombosis of unspecified deep veins of unspecified lower extremity: Secondary | ICD-10-CM

## 2015-03-29 DIAGNOSIS — I4891 Unspecified atrial fibrillation: Secondary | ICD-10-CM

## 2015-03-29 LAB — POCT INR: INR: 2.3

## 2015-03-29 NOTE — Patient Instructions (Signed)
INR 2.3 Continue coumadin 5mg  daily

## 2015-03-29 NOTE — Progress Notes (Signed)
   Subjective:    Patient ID: Andrea [name], female    DOB: 06-29-1935, 80 y.o.   MRN: [id number]  HPI  Last INR on 03/01/15 was OK at 2.6 Current Coumadin dose is 5mg  QD Denies missed doses. Denies unusual bleeding or bruising. Denies CP, palpitations, SOB, falls. Consistent with vitamin K intake.    Review of Systems  HENT: Negative for nosebleeds.   Respiratory: Negative for shortness of breath.   Cardiovascular: Negative for chest pain and palpitations.  Gastrointestinal: Negative for blood in stool and anal bleeding.  Genitourinary: Negative for hematuria.  Hematological: Does not bruise/bleed easily.       Objective:   Physical Exam  Constitutional: She is oriented to person, place, and time. She appears well-developed and well-nourished.  HENT:  Right Ear: External ear normal.  Left Ear: External ear normal.  Cardiovascular: Normal rate, regular rhythm and normal heart sounds.   Pulmonary/Chest: Effort normal and breath sounds normal.  Neurological: She is alert and oriented to person, place, and time.  Skin: Skin is warm and dry.  Psychiatric: She has a normal mood and affect. Her behavior is normal. Judgment and thought content normal.  Vitals reviewed.   BP:  100/78  HR:  59  Wt:  204lb INR 2.3      Assessment & Plan:  1.  INR at goal 2-3 2.  Continue Coumadin 5mg  QD 3.  RTC `1  month

## 2015-04-01 DIAGNOSIS — M7052 Other bursitis of knee, left knee: Secondary | ICD-10-CM | POA: Diagnosis not present

## 2015-04-01 DIAGNOSIS — Z96651 Presence of right artificial knee joint: Secondary | ICD-10-CM | POA: Diagnosis not present

## 2015-04-01 DIAGNOSIS — Z96652 Presence of left artificial knee joint: Secondary | ICD-10-CM | POA: Diagnosis not present

## 2015-04-01 DIAGNOSIS — M7062 Trochanteric bursitis, left hip: Secondary | ICD-10-CM | POA: Diagnosis not present

## 2015-04-06 DIAGNOSIS — F3181 Bipolar II disorder: Secondary | ICD-10-CM | POA: Diagnosis not present

## 2015-04-07 DIAGNOSIS — H04123 Dry eye syndrome of bilateral lacrimal glands: Secondary | ICD-10-CM | POA: Diagnosis not present

## 2015-04-15 ENCOUNTER — Encounter: Payer: Self-pay | Admitting: Internal Medicine

## 2015-04-15 ENCOUNTER — Ambulatory Visit (INDEPENDENT_AMBULATORY_CARE_PROVIDER_SITE_OTHER): Payer: Medicare Other | Admitting: Internal Medicine

## 2015-04-15 VITALS — BP 130/80 | HR 75 | Temp 98.6°F | Resp 20 | Ht 67.0 in | Wt 202.8 lb

## 2015-04-15 DIAGNOSIS — E039 Hypothyroidism, unspecified: Secondary | ICD-10-CM | POA: Diagnosis not present

## 2015-04-15 DIAGNOSIS — I480 Paroxysmal atrial fibrillation: Secondary | ICD-10-CM

## 2015-04-15 DIAGNOSIS — G4700 Insomnia, unspecified: Secondary | ICD-10-CM | POA: Diagnosis not present

## 2015-04-15 DIAGNOSIS — G894 Chronic pain syndrome: Secondary | ICD-10-CM | POA: Diagnosis not present

## 2015-04-15 DIAGNOSIS — F32A Depression, unspecified: Secondary | ICD-10-CM

## 2015-04-15 DIAGNOSIS — F329 Major depressive disorder, single episode, unspecified: Secondary | ICD-10-CM

## 2015-04-15 DIAGNOSIS — I82409 Acute embolism and thrombosis of unspecified deep veins of unspecified lower extremity: Secondary | ICD-10-CM | POA: Diagnosis not present

## 2015-04-15 MED ORDER — WARFARIN SODIUM 5 MG PO TABS
5.0000 mg | ORAL_TABLET | Freq: Every day | ORAL | Status: DC
Start: 2015-04-15 — End: 2015-04-26

## 2015-04-15 MED ORDER — OXYCODONE-ACETAMINOPHEN 10-325 MG PO TABS
ORAL_TABLET | ORAL | Status: DC
Start: 2015-04-15 — End: 2015-08-02

## 2015-04-15 MED ORDER — BREXPIPRAZOLE 0.5 MG PO TABS: 0.2500 | ORAL_TABLET | Freq: Every day | ORAL | Status: DC

## 2015-04-15 NOTE — Patient Instructions (Signed)
Please bring us a copy of your living will and health care power of attorney.  

## 2015-04-15 NOTE — Progress Notes (Signed)
Patient ID: Andrea [name], female   DOB: [birth year], 80 y.o.   MRN: [id number]   Location:  [facility]  Code Status: DNR Goals of Care: Advanced Directive information Does patient have an advance directive?: Yes, Type of Advance Directive: Living will;Healthcare Power of Attorney, Does patient want to make changes to advanced directive?: No - Patient declined   Chief Complaint  Patient presents with  . Medical Management of Chronic Issues    3 month follow-up,labs printed    HPI: Patient is a 80 y.o.  seen in the office today for med mgt of chronic diseases.    Taking three trazodone, 1/2 resulti and 1/2 remeron for sleep.    Was having difficulty with inability to read.  Was noted to have terribly dry eyes so on 3 different eye drops.  Says eyes are redder.    TSH was in normal range last check 6 mos ago.  Wants pain med Rx and coumadin Rx.  Was put on the coumadin originally for her DVT, but has also had paroxysmal afib.  Does not eat a lot of greens so usually has only required a monthly check and is not bothered by taking the coumadin.    Raynaud's.  Fingers will get very painful.    Mobic has helped considerably with her pain and the injections from Dr. Mina Marble.    Review of Systems:  Review of Systems  Constitutional: Negative for fever and chills.  HENT: Negative for congestion.   Eyes: Negative for blurred vision.  Respiratory: Negative for shortness of breath.   Cardiovascular: Negative for chest pain.  Gastrointestinal: Positive for constipation. Negative for abdominal pain.  Genitourinary: Positive for urgency. Negative for dysuria.       Continues on myrbetriq with benefit  Musculoskeletal: Positive for back pain and joint pain. Negative for falls.       Improved  Skin: Negative for rash.  Neurological: Negative for dizziness and loss of consciousness.  Psychiatric/Behavioral: Positive for depression and memory loss. The  patient is nervous/anxious and has insomnia.        Improved    Past Medical History  Diagnosis Date  . Hypotension   . Depression with anxiety   . Cough     Wert-onset 08/2009, as of 2014- resolved   . Deep venous thrombosis     post op, rec'd/needed  blood thinner   . Cervicalgia   . Hypothyroidism   . Blood transfusion   . Constipation   . Shortness of breath   . Anxiety   . Dysrhythmia   . Anemia   . Depression   . Lumbar radiculopathy 09/26/2011  . Headache(784.0) 09/27/2011  . Pneumonia     never been in hosp. for pneumonia   . Sleep apnea     doesn't use CPAP any longer, since weight loss  . Arthritis     hands, spine   . History of stress test     15-20 yrs.ago   . Falls frequently     pt. reports that she was falling 3 times a day, has had PT at a facility for a while & now is getting home PT 2-3 times/ week   . Right hand fracture     Monday  . Prediabetes     Past Surgical History  Procedure Laterality Date  . Replacement total knee bilateral    . Appendectomy    . Breast excisional biopsy  breast bxs on left x 2, breast bx of right x 1-all benign  . Laparoscopic ovarian cystectomy      not done by laparscopy-abdominal incision  . Joint replacement  [id number]    bilateral  . Tonsillectomy    . Eye surgery      cataracts bilateral /w IOL  . Hernia repair      umbilical hernia- 9983  . Back surgery      2000  . Anterior cervical decomp/discectomy fusion N/A 07/09/2013    Procedure: ANTERIOR CERVICAL DECOMPRESSION/DISCECTOMY FUSION 3 LEVELS;  Surgeon: Sinclair Ship, MD;  Location: [place];  Service: Orthopedics;  Laterality: N/A;  Anterior cervical decompression fusion, cervical 3-4, cervical 4-5-, cervical 5-6 with instrumentation, allograft.    Allergies  Allergen Reactions  . Clindamycin Hcl Other (See Comments)    REACTION: swelling, pt. Reports that she had a 16 lb. weightgain in one day   Medications: Patient's Medications  New  Prescriptions   No medications on file  Previous Medications   BUSPIRONE (BUSPAR) 15 MG TABLET    Take 15 mg by mouth daily.   CALCIUM CITRATE PO    Take 600 mg by mouth 2 (two) times daily.   CLONAZEPAM (KLONOPIN) 0.5 MG TABLET    Take 1 tablet by mouth as needed in middle of the nite to go back to sleep   DULOXETINE HCL (CYMBALTA PO)    Take 60 mg by mouth 2 (two) times daily.    FUROSEMIDE (LASIX) 40 MG TABLET    TAKE 1 TABLET (40 MG TOTAL) BY MOUTH DAILY.   LEVOTHYROXINE (SYNTHROID, LEVOTHROID) 50 MCG TABLET    TAKE 1 TABLET (50 MCG TOTAL) BY MOUTH EVERY MORNING.   MELOXICAM (MOBIC) 15 MG TABLET    TAKE 1 TABLET BY MOUTH EVERY DAY   MIRABEGRON ER (MYRBETRIQ) 25 MG TB24 TABLET    Take 25 mg by mouth daily.   MIRTAZAPINE (REMERON) 7.5 MG TABLET    Take 7.5 mg by mouth at bedtime.   MULTIPLE VITAMINS-MINERALS (MULTIVITAMIN PO)    Take 1 tablet by mouth.   OXYCODONE-ACETAMINOPHEN (PERCOCET) 10-325 MG PER TABLET    Take 1-2 tablets by mouth every 4-6 hours as needed for Arthritis pain   PREDNISOLONE ACETATE (PRED FORTE) 1 % OPHTHALMIC SUSPENSION       TRAZODONE (DESYREL) 100 MG TABLET    Take 3 tablets by mouth at bedtime   WARFARIN (COUMADIN) 5 MG TABLET    Take 1 tablet (5 mg total) by mouth daily. Take 1 tablet daily except 1&1/2 tablets on  Fridays  Modified Medications   No medications on file  Discontinued Medications   ARIPIPRAZOLE (ABILIFY) 10 MG TABLET    Take 10 mg by mouth daily.   MIRTAZAPINE (REMERON) 15 MG TABLET    Take 15 mg by mouth at bedtime.    Physical Exam: Filed Vitals:   04/15/15 1431  BP: 130/80  Pulse: 75  Temp: 98.6 F (37 C)  TempSrc: Oral  Resp: 20  Height: 5\' 7"  (1.702 m)  Weight: 202 lb 12.8 oz (91.989 kg)  SpO2: 98%   Physical Exam  Constitutional: She is oriented to person, place, and time. She appears well-developed and well-nourished.  Cardiovascular: Normal rate, regular rhythm, normal heart sounds and intact distal pulses.   Pulmonary/Chest:  Effort normal and breath sounds normal. No respiratory distress.  Abdominal: Soft. Bowel sounds are normal.  Musculoskeletal: Normal range of motion.  Walks with cane  Neurological: She  is alert and oriented to person, place, and time.  Skin: Skin is warm and dry.  Psychiatric: She has a normal mood and affect.    Labs reviewed: Basic Metabolic Panel:  Recent Labs  04/22/14 1135 09/21/14 1249 10/12/14 1407  NA 140 143 142  K 4.5 4.6 4.9  CL 100 97 100  CO2 27 25 28   GLUCOSE 83 102* 131*  BUN 24 31* 31*  CREATININE 0.91 0.98 1.07*  CALCIUM 9.0 10.0 9.4  TSH  --   --  1.090   Liver Function Tests: No results for input(s): AST, ALT, ALKPHOS, BILITOT, PROT, ALBUMIN in the last 8760 hours. No results for input(s): LIPASE, AMYLASE in the last 8760 hours. No results for input(s): AMMONIA in the last 8760 hours. CBC:  Recent Labs  04/22/14 1135  WBC 6.0  NEUTROABS 4.2  HGB 12.2  HCT 37.7  MCV 84  PLT 195   Lipid Panel: No results for input(s): CHOL, HDL, LDLCALC, TRIG, CHOLHDL, LDLDIRECT in the last 8760 hours. Lab Results  Component Value Date   HGBA1C 5.9* 10/12/2014   Assessment/Plan 1. DVT (deep venous thrombosis), unspecified laterality -discussed use of xarelto as alternative to coumadin, but she opts to continue her coumadin and is not bothered by needs for dietary changes and frequent checks - warfarin (COUMADIN) 5 MG tablet; Take 1 tablet (5 mg total) by mouth daily.  Dispense: 30 tablet; Refill: 3  2. Paroxysmal a-fib -cont coumadin with INR goal 2-3 -rate controlled and was in sinus today  3. Hypothyroidism, unspecified hypothyroidism type -cont current synthroid, tsh was wnl 6 mos ago  4. Chronic pain syndrome - cont current percocet, has not needed any refill since February--shots in her back have been very helpful - oxyCODONE-acetaminophen (PERCOCET) 10-325 MG per tablet; Take 1-2 tablets by mouth every 4-6 hours as needed for Arthritis pain   Dispense: 180 tablet; Refill: 0  5. Clinical depression -cont remeron, trazodone, rexulti, buspar -worried about cost of rexulti--samples given  6. Insomnia - psychiatry is managing this - Brexpiprazole (REXULTI) 0.5 MG TABS; Take 0.25 tablets by mouth at bedtime.  Dispense: 30 tablet; Refill: 3  Also given a month's supply of myrbetriq 25mg  daily   Labs/tests ordered: has future labs that she has not gotten before today's visit--advised to get next week  Next appt:  3 mos  Jaaziel Peatross L. Irfan Veal, D.O. [facility] [address], [place] [address] Cell Phone (Mon-Fri 8am-5pm):  [phone] On Call:  [phone] & follow prompts after 5pm & weekends Office Phone:  [phone] Office Fax:  [phone]

## 2015-04-20 ENCOUNTER — Other Ambulatory Visit: Payer: Medicare Other

## 2015-04-21 ENCOUNTER — Other Ambulatory Visit: Payer: Medicare Other

## 2015-04-21 DIAGNOSIS — I4891 Unspecified atrial fibrillation: Secondary | ICD-10-CM | POA: Diagnosis not present

## 2015-04-21 DIAGNOSIS — E039 Hypothyroidism, unspecified: Secondary | ICD-10-CM

## 2015-04-21 DIAGNOSIS — R739 Hyperglycemia, unspecified: Secondary | ICD-10-CM

## 2015-04-22 ENCOUNTER — Other Ambulatory Visit: Payer: Self-pay | Admitting: Internal Medicine

## 2015-04-22 LAB — CBC WITH DIFFERENTIAL/PLATELET
Basophils Absolute: 0 10*3/uL (ref 0.0–0.2)
Basos: 1 %
EOS (ABSOLUTE): 0.1 10*3/uL (ref 0.0–0.4)
Eos: 3 %
Hematocrit: 39.4 % (ref 34.0–46.6)
Hemoglobin: 13.5 g/dL (ref 11.1–15.9)
Immature Grans (Abs): 0 10*3/uL (ref 0.0–0.1)
Immature Granulocytes: 0 %
Lymphocytes Absolute: 1.5 10*3/uL (ref 0.7–3.1)
Lymphs: 28 %
MCH: 30.3 pg (ref 26.6–33.0)
MCHC: 34.3 g/dL (ref 31.5–35.7)
MCV: 88 fL (ref 79–97)
Monocytes Absolute: 0.3 10*3/uL (ref 0.1–0.9)
Monocytes: 6 %
Neutrophils Absolute: 3.3 10*3/uL (ref 1.4–7.0)
Neutrophils: 62 %
Platelets: 223 10*3/uL (ref 150–379)
RBC: 4.46 x10E6/uL (ref 3.77–5.28)
RDW: 14.8 % (ref 12.3–15.4)
WBC: 5.3 10*3/uL (ref 3.4–10.8)

## 2015-04-22 LAB — COMPREHENSIVE METABOLIC PANEL
ALT: 14 IU/L (ref 0–32)
AST: 17 IU/L (ref 0–40)
Albumin/Globulin Ratio: 1.9 (ref 1.1–2.5)
Albumin: 4.1 g/dL (ref 3.5–4.8)
Alkaline Phosphatase: 65 IU/L (ref 39–117)
BUN/Creatinine Ratio: 28 — ABNORMAL HIGH (ref 11–26)
BUN: 26 mg/dL (ref 8–27)
Bilirubin Total: 0.5 mg/dL (ref 0.0–1.2)
CO2: 25 mmol/L (ref 18–29)
Calcium: 9.3 mg/dL (ref 8.7–10.3)
Chloride: 102 mmol/L (ref 97–108)
Creatinine, Ser: 0.92 mg/dL (ref 0.57–1.00)
GFR calc Af Amer: 69 mL/min/{1.73_m2} (ref >59)
GFR calc non Af Amer: 60 mL/min/{1.73_m2} (ref >59)
Globulin, Total: 2.2 g/dL (ref 1.5–4.5)
Glucose: 99 mg/dL (ref 65–99)
Potassium: 4.5 mmol/L (ref 3.5–5.2)
Sodium: 141 mmol/L (ref 134–144)
Total Protein: 6.3 g/dL (ref 6.0–8.5)

## 2015-04-22 LAB — HEMOGLOBIN A1C
Est. average glucose Bld gHb Est-mCnc: 117 mg/dL
Hgb A1c MFr Bld: 5.7 % — ABNORMAL HIGH (ref 4.8–5.6)

## 2015-04-22 LAB — TSH: TSH: 3.65 u[IU]/mL (ref 0.450–4.500)

## 2015-04-26 ENCOUNTER — Other Ambulatory Visit: Payer: Self-pay | Admitting: Internal Medicine

## 2015-04-26 ENCOUNTER — Ambulatory Visit (INDEPENDENT_AMBULATORY_CARE_PROVIDER_SITE_OTHER): Payer: Medicare Other | Admitting: Pharmacotherapy

## 2015-04-26 ENCOUNTER — Encounter: Payer: Self-pay | Admitting: Pharmacotherapy

## 2015-04-26 VITALS — BP 110/86 | HR 72 | Temp 99.1°F | Resp 20 | Ht 67.0 in | Wt 195.8 lb

## 2015-04-26 DIAGNOSIS — I82409 Acute embolism and thrombosis of unspecified deep veins of unspecified lower extremity: Secondary | ICD-10-CM | POA: Diagnosis not present

## 2015-04-26 DIAGNOSIS — I480 Paroxysmal atrial fibrillation: Secondary | ICD-10-CM

## 2015-04-26 LAB — POCT INR: INR: 5.3

## 2015-04-26 MED ORDER — WARFARIN SODIUM 5 MG PO TABS: ORAL_TABLET | ORAL | Status: DC

## 2015-04-26 NOTE — Patient Instructions (Signed)
INR 5.3 No coumadin today Then start Coumadin 5mg  QD except 2.5mg  Tuesdays and Fridays

## 2015-04-26 NOTE — Progress Notes (Signed)
   Subjective:    Patient ID: Andrea [name], female    DOB: October 17, 1935, 80 y.o.   MRN: [id number]  HPI Last INR was OK at 2.3 Current coumadin dose is 5mg  QD  Denies CP, palpitations, falls Missed 2 doses Denies unusual bleeding or bruising. Consistent with vitamin K  Review of Systems  HENT: Negative for nosebleeds.   Respiratory: Negative for shortness of breath.   Cardiovascular: Negative for chest pain and palpitations.  Gastrointestinal: Negative for blood in stool and anal bleeding.  Genitourinary: Negative for hematuria.  Hematological: Does not bruise/bleed easily.       Objective:   Physical Exam  Constitutional: She is oriented to person, place, and time. She appears well-developed and well-nourished.  HENT:  Right Ear: External ear normal.  Left Ear: External ear normal.  Cardiovascular: Normal rate, regular rhythm and normal heart sounds.   Pulmonary/Chest: Effort normal and breath sounds normal.  Neurological: She is alert and oriented to person, place, and time.  Skin: Skin is warm and dry.  Psychiatric: She has a normal mood and affect. Her behavior is normal. Judgment and thought content normal.  Vitals reviewed.   INR 5.3 BP:  110/86  HR:  72  Wt:  195lb      Assessment & Plan:  1.  INR above goal 2-3 2.  Hold Coumadin today 3.  Restart Coumadin at 5mg  QD except 2.5mg  T/F

## 2015-05-03 DIAGNOSIS — F3181 Bipolar II disorder: Secondary | ICD-10-CM | POA: Diagnosis not present

## 2015-05-10 ENCOUNTER — Ambulatory Visit (INDEPENDENT_AMBULATORY_CARE_PROVIDER_SITE_OTHER): Payer: Medicare Other | Admitting: Pharmacotherapy

## 2015-05-10 ENCOUNTER — Encounter: Payer: Self-pay | Admitting: Pharmacotherapy

## 2015-05-10 VITALS — BP 118/60 | HR 68 | Temp 97.4°F | Resp 20 | Ht 67.0 in | Wt 196.0 lb

## 2015-05-10 DIAGNOSIS — I480 Paroxysmal atrial fibrillation: Secondary | ICD-10-CM | POA: Diagnosis not present

## 2015-05-10 DIAGNOSIS — I82409 Acute embolism and thrombosis of unspecified deep veins of unspecified lower extremity: Secondary | ICD-10-CM

## 2015-05-10 LAB — POCT INR: INR: 1.7

## 2015-05-10 NOTE — Patient Instructions (Signed)
INR 1.7 Continue Coumadin 5mg  daily except 1/2 tablet (2.5mg ) on Tuesdays and Fridays

## 2015-05-10 NOTE — Progress Notes (Signed)
   Subjective:    Patient ID: Andrea [name], female    DOB: Nov 30, 1934, 80 y.o.   MRN: [id number]  HPI Last INR on 04/26/15 was high at 5.3 Coumadin was held, then reduced to 5mg  qD except 2.5mg  T/F Missed her dose last night Denies unusual bleeding or bruising Denies CP, falls Consistent with vitamin K   Review of Systems  HENT: Negative for nosebleeds.   Cardiovascular: Negative for chest pain and palpitations.  Gastrointestinal: Negative for blood in stool and anal bleeding.  Genitourinary: Negative for hematuria.  Hematological: Bruises/bleeds easily.       Objective:   Physical Exam  Constitutional: She is oriented to person, place, and time. She appears well-developed and well-nourished.  HENT:  Right Ear: External ear normal.  Left Ear: External ear normal.  Cardiovascular: Normal rate, regular rhythm and normal heart sounds.   Pulmonary/Chest: Effort normal and breath sounds normal.  Neurological: She is alert and oriented to person, place, and time.  Skin: Skin is warm and dry.  Psychiatric: She has a normal mood and affect. Her behavior is normal. Judgment and thought content normal.  Vitals reviewed.   INR 1.7  BP:  118/60   HR:  68  Wt:  196lb       Assessment & Plan:  1.  INR below goal 2-3 due to missed dose 2.  Continue Coumadin 5mg  QD except 2.5mg  T/F 3.  RTC in 3 weeks

## 2015-05-31 ENCOUNTER — Ambulatory Visit (INDEPENDENT_AMBULATORY_CARE_PROVIDER_SITE_OTHER): Payer: Medicare Other | Admitting: Pharmacotherapy

## 2015-05-31 ENCOUNTER — Encounter: Payer: Self-pay | Admitting: Pharmacotherapy

## 2015-05-31 VITALS — BP 110/70 | HR 74 | Temp 99.7°F | Resp 18 | Ht 67.0 in | Wt 199.0 lb

## 2015-05-31 DIAGNOSIS — I480 Paroxysmal atrial fibrillation: Secondary | ICD-10-CM | POA: Diagnosis not present

## 2015-05-31 DIAGNOSIS — I82409 Acute embolism and thrombosis of unspecified deep veins of unspecified lower extremity: Secondary | ICD-10-CM

## 2015-05-31 DIAGNOSIS — F32A Depression, unspecified: Secondary | ICD-10-CM

## 2015-05-31 DIAGNOSIS — F329 Major depressive disorder, single episode, unspecified: Secondary | ICD-10-CM | POA: Diagnosis not present

## 2015-05-31 LAB — POCT INR: INR: 1.5

## 2015-05-31 NOTE — Progress Notes (Signed)
   Subjective:    Patient ID: Andrea [name], female    DOB: Feb 24, 1935, 80 y.o.   MRN: [id number]  HPI She did miss a dose of warfarin last night. Denies unusual bleeding or bruising Denies CP, palpitations, and falls Consistent with vitamin K intake   Review of Systems  HENT: Negative for nosebleeds.   Cardiovascular: Negative for chest pain and palpitations.  Gastrointestinal: Negative for anal bleeding.  Genitourinary: Negative for hematuria.  Hematological: Does not bruise/bleed easily.       Objective:   Physical Exam  Constitutional: She is oriented to person, place, and time. She appears well-developed and well-nourished.  HENT:  Right Ear: External ear normal.  Left Ear: External ear normal.  Cardiovascular: Normal rate, regular rhythm and normal heart sounds.   Pulmonary/Chest: Effort normal and breath sounds normal.  Neurological: She is alert and oriented to person, place, and time.  Skin: Skin is warm and dry.  Psychiatric: She has a normal mood and affect. Her behavior is normal. Judgment and thought content normal.  Vitals reviewed.   BP:  11/70  HR:  74  Wt:  199lb INR 1.5       Assessment & Plan:  1.  INR below target 2-3 due to missed dose. 2.  Continue Coumadin 5mg  daily except 2.5mg  M/F 3.  RTC in 3 weeks

## 2015-05-31 NOTE — Patient Instructions (Signed)
INR below goal 2-3 due to missed dose. Continue same dose Coumadin

## 2015-06-09 DIAGNOSIS — H04123 Dry eye syndrome of bilateral lacrimal glands: Secondary | ICD-10-CM | POA: Diagnosis not present

## 2015-06-17 ENCOUNTER — Encounter: Payer: Self-pay | Admitting: Internal Medicine

## 2015-06-17 ENCOUNTER — Ambulatory Visit (INDEPENDENT_AMBULATORY_CARE_PROVIDER_SITE_OTHER): Payer: Medicare Other | Admitting: Internal Medicine

## 2015-06-17 VITALS — BP 104/76 | HR 68 | Temp 98.2°F | Resp 20 | Ht 67.0 in | Wt 194.4 lb

## 2015-06-17 DIAGNOSIS — L659 Nonscarring hair loss, unspecified: Secondary | ICD-10-CM | POA: Diagnosis not present

## 2015-06-17 DIAGNOSIS — F329 Major depressive disorder, single episode, unspecified: Secondary | ICD-10-CM

## 2015-06-17 DIAGNOSIS — I480 Paroxysmal atrial fibrillation: Secondary | ICD-10-CM | POA: Diagnosis not present

## 2015-06-17 DIAGNOSIS — G4700 Insomnia, unspecified: Secondary | ICD-10-CM

## 2015-06-17 DIAGNOSIS — E039 Hypothyroidism, unspecified: Secondary | ICD-10-CM

## 2015-06-17 DIAGNOSIS — E669 Obesity, unspecified: Secondary | ICD-10-CM

## 2015-06-17 DIAGNOSIS — N3946 Mixed incontinence: Secondary | ICD-10-CM

## 2015-06-17 DIAGNOSIS — F32A Depression, unspecified: Secondary | ICD-10-CM

## 2015-06-17 DIAGNOSIS — G4733 Obstructive sleep apnea (adult) (pediatric): Secondary | ICD-10-CM

## 2015-06-17 DIAGNOSIS — G894 Chronic pain syndrome: Secondary | ICD-10-CM

## 2015-06-17 DIAGNOSIS — Z9989 Dependence on other enabling machines and devices: Secondary | ICD-10-CM

## 2015-06-17 DIAGNOSIS — R739 Hyperglycemia, unspecified: Secondary | ICD-10-CM

## 2015-06-17 MED ORDER — DULOXETINE HCL 60 MG PO CPEP: 120.0000 mg | ORAL_CAPSULE | Freq: Every day | ORAL | Status: DC

## 2015-06-17 MED ORDER — BREXPIPRAZOLE 0.5 MG PO TABS: 1.0000 | ORAL_TABLET | Freq: Every day | ORAL | Status: DC

## 2015-06-17 MED ORDER — FUROSEMIDE 40 MG PO TABS: ORAL_TABLET | ORAL | Status: DC

## 2015-06-17 NOTE — Progress Notes (Signed)
Patient ID: Andrea [name], female   DOB: 1935-09-11, 80 y.o.   MRN: [id number]   Location:  [facility]  Code Status: DNR Goals of Care: Advanced Directive information Does patient have an advance directive?: Yes, Type of Advance Directive: [place];Living will;Out of facility DNR (pink MOST or yellow form), Does patient want to make changes to advanced directive?: No - Patient declined   Chief Complaint  Patient presents with  . Medical Management of Chronic Issues     3 month follow-up ,other ( fill out form)    HPI: Patient is a 80 y.o. white female seen in the office today for medical management of chronic diseases and to have forms completed to order meds from new mail order.  Andrea [name] manages her psych meds but she's gone until August.  Has new mail order plan they want to use.  Want to use rexulti.   Doing well with cymbalta--only feels sad once in a while, but no thoughts of how she could die.    Sleeping well with rexulti 0.5mg  (has been increased by psych).  Also on trazodone.  Has coumadin for DVT.  INR goal 2-3.  Follows with Tivis Ringer.  Urinary incontinence:  Urge and overflow.  Doing well with myrbetriq 25mg .    Hypothyroidism:  Last tsh was normal last month, but she is concerned her hair is thinning and was told by her hairdresser to have the free t3/t4 checked.  Obesity:  Is not able to do much exercise.  Has chronic diffuse pain from osteoarthritis.  Especially back and hips.    She is doing well using her cpap for her sleep apnea, but still was seen nodding off at her early am appt.  Hyperglycemia previously noted, but hba1c has been stable in the prediabetic range in fact improved from 8 mos ago.    Review of Systems:  Review of Systems  Constitutional: Positive for malaise/fatigue. Negative for fever and chills.  HENT: Negative for congestion.   Eyes: Positive for blurred vision.       Only able  to read titles/large print not her books she enjoys; following up with ophtho and using new drops  Respiratory: Negative for shortness of breath.   Cardiovascular: Positive for leg swelling. Negative for chest pain.       Improved lately and had been dizzy and lightheaded so caregiver cut back lasix to 1/2 pill daily  Gastrointestinal: Negative for abdominal pain, diarrhea, constipation, blood in stool and melena.  Genitourinary: Positive for urgency and frequency.       Improved  Musculoskeletal: Positive for myalgias, back pain and joint pain. Negative for falls.  Skin: Negative for rash.  Neurological: Positive for weakness. Negative for dizziness, loss of consciousness and headaches.  Endo/Heme/Allergies: Bruises/bleeds easily.  Psychiatric/Behavioral: Positive for depression and memory loss. The patient has insomnia.        Under good control    Past Medical History  Diagnosis Date  . Hypotension   . Depression with anxiety   . Cough     Wert-onset 08/2009, as of 2014- resolved   . Deep venous thrombosis     post op, rec'd/needed  blood thinner   . Cervicalgia   . Hypothyroidism   . Blood transfusion   . Constipation   . Shortness of breath   . Anxiety   . Dysrhythmia   . Anemia   . Depression   . Lumbar radiculopathy 09/26/2011  .  Headache(784.0) 09/27/2011  . Pneumonia     never been in hosp. for pneumonia   . Sleep apnea     doesn't use CPAP any longer, since weight loss  . Arthritis     hands, spine   . History of stress test     15-20 yrs.ago   . Falls frequently     pt. reports that she was falling 3 times a day, has had PT at a facility for a while & now is getting home PT 2-3 times/ week   . Right hand fracture     Monday  . Prediabetes     Past Surgical History  Procedure Laterality Date  . Replacement total knee bilateral    . Appendectomy    . Breast excisional biopsy      breast bxs on left x 2, breast bx of right x 1-all benign  . Laparoscopic  ovarian cystectomy      not done by laparscopy-abdominal incision  . Joint replacement  [id number]    bilateral  . Tonsillectomy    . Eye surgery      cataracts bilateral /w IOL  . Hernia repair      umbilical hernia- 4825  . Back surgery      2000  . Anterior cervical decomp/discectomy fusion N/A 07/09/2013    Procedure: ANTERIOR CERVICAL DECOMPRESSION/DISCECTOMY FUSION 3 LEVELS;  Surgeon: Sinclair Ship, MD;  Location: [place];  Service: Orthopedics;  Laterality: N/A;  Anterior cervical decompression fusion, cervical 3-4, cervical 4-5-, cervical 5-6 with instrumentation, allograft.    Allergies  Allergen Reactions  . Clindamycin Hcl Other (See Comments)    REACTION: swelling, pt. Reports that she had a 16 lb. weightgain in one day   Medications: Patient's Medications  New Prescriptions   No medications on file  Previous Medications   BUSPIRONE (BUSPAR) 15 MG TABLET    Take 15 mg by mouth daily.   CALCIUM CITRATE PO    Take 600 mg by mouth 2 (two) times daily.   CLONAZEPAM (KLONOPIN) 0.5 MG TABLET    Take 1 tablet by mouth as needed in middle of the nite to go back to sleep   LEVOTHYROXINE (SYNTHROID, LEVOTHROID) 50 MCG TABLET    TAKE 1 TABLET (50 MCG TOTAL) BY MOUTH EVERY MORNING.   MELOXICAM (MOBIC) 15 MG TABLET    TAKE 1 TABLET EVERY DAY   MIRABEGRON ER (MYRBETRIQ) 25 MG TB24 TABLET    Take 25 mg by mouth daily.   MIRTAZAPINE (REMERON) 15 MG TABLET    Take 7.5 mg by mouth at bedtime.   MULTIPLE VITAMINS-MINERALS (MULTIVITAMIN PO)    Take 1 tablet by mouth.   OXYCODONE-ACETAMINOPHEN (PERCOCET) 10-325 MG PER TABLET    Take 1-2 tablets by mouth every 4-6 hours as needed for Arthritis pain   TRAZODONE (DESYREL) 100 MG TABLET    Take 3 tablets by mouth at bedtime   WARFARIN (COUMADIN) 5 MG TABLET    Take 1 tablet daily except 1/2 tablet on Tuesdays and Fridays  Modified Medications   Modified Medication Previous Medication   BREXPIPRAZOLE (REXULTI) 0.5 MG TABS Brexpiprazole  (REXULTI) 0.5 MG TABS      Take 1 tablet by mouth at bedtime.    Take 0.25 tablets by mouth at bedtime.   DULOXETINE (CYMBALTA) 60 MG CAPSULE DULoxetine (CYMBALTA) 60 MG capsule      Take 2 capsules (120 mg total) by mouth daily.    Take 120 mg by mouth daily.  FUROSEMIDE (LASIX) 40 MG TABLET furosemide (LASIX) 40 MG tablet      TAKE 1/2 TABLET (20 MG TOTAL) BY MOUTH DAILY.    TAKE 1 TABLET (40 MG TOTAL) BY MOUTH DAILY.  Discontinued Medications   MIRTAZAPINE (REMERON) 7.5 MG TABLET    Take 7.5 mg by mouth at bedtime.    Physical Exam: Filed Vitals:   06/17/15 0914  BP: 104/76  Pulse: 68  Temp: 98.2 F (36.8 C)  TempSrc: Oral  Resp: 20  Height: 5\' 7"  (1.702 m)  Weight: 194 lb 6.4 oz (88.179 kg)  SpO2: 96%   Physical Exam  Constitutional: She is oriented to person, place, and time. She appears well-developed and well-nourished. No distress.  Cardiovascular: Normal rate, regular rhythm, normal heart sounds and intact distal pulses.   Pulmonary/Chest: Effort normal and breath sounds normal. No respiratory distress.  Abdominal: Soft. Bowel sounds are normal. She exhibits no distension. There is no tenderness.  Musculoskeletal:  Slow shuffling gait with rollator walker  Neurological: She is alert and oriented to person, place, and time.  Skin: Skin is warm and dry.  Chronic venous insufficiency changes  Psychiatric: She has a normal mood and affect.    Labs reviewed: Basic Metabolic Panel:  Recent Labs  09/21/14 1249 10/12/14 1407 04/21/15 0819  NA 143 142 141  K 4.6 4.9 4.5  CL 97 100 102  CO2 25 28 25   GLUCOSE 102* 131* 99  BUN 31* 31* 26  CREATININE 0.98 1.07* 0.92  CALCIUM 10.0 9.4 9.3  TSH  --  1.090 3.650   Liver Function Tests:  Recent Labs  04/21/15 0819  AST 17  ALT 14  ALKPHOS 65  BILITOT 0.5  PROT 6.3   No results for input(s): LIPASE, AMYLASE in the last 8760 hours. No results for input(s): AMMONIA in the last 8760 hours. CBC:  Recent  Labs  04/21/15 0828  WBC 5.3  NEUTROABS 3.3  HCT 39.4   Lipid Panel: No results for input(s): CHOL, HDL, LDLCALC, TRIG, CHOLHDL, LDLDIRECT in the last 8760 hours. Lab Results  Component Value Date   HGBA1C 5.7* 04/21/2015   Assessment/Plan 1. Insomnia -cont trazodone and rexulti as per psychrecommendations--doing well -form completed to get her the rexulti by mail and caregiver to later change prescriber to her psychiatrist - Brexpiprazole (REXULTI) 0.5 MG TABS; Take 1 tablet by mouth at bedtime.  Dispense: 90 tablet; Refill: 3  2. Depression -stable with cymbalta and rexulti--cont those - Brexpiprazole (REXULTI) 0.5 MG TABS; Take 1 tablet by mouth at bedtime.  Dispense: 90 tablet; Refill: 3 - DULoxetine (CYMBALTA) 60 MG capsule; Take 2 capsules (120 mg total) by mouth daily.  Dispense: 180 capsule; Refill: 3 -cymbalta 90 day Rx and form done also  3. Chronic pain syndrome -ongoing with use of mobic and oxycodone for her severe arthritis pains in her back, hips, knees, hands - Comprehensive metabolic panel; Future  4. Hair thinning - TSH normal in June, but will check free hormone levels to make sure they are ok too -cont synthroid 35mcg daily first thing in am on empty stomach separate from other pills - T3, free - T4, free  5. Paroxysmal a-fib -cont coumadin; not on any rate control meds--no recent afib episodes  6. Hypothyroidism, unspecified hypothyroidism type -cont current synthroid - T3, free - T4, free  7. Mixed incontinence -cont myrbetriq 25mg  daily which has been effective at reducing frequency and incontinence episodes  8. OSA on CPAP -improved  -cont  CPAP at hs  9. Obesity - ongoing, is inactive so hard for her to lose weight  - CBC with Differential/Platelet; Future - Hemoglobin A1c; Future - Lipid panel; Future  10. Hyperglycemia - has been gradually improving - Hemoglobin A1c; Future  Labs/tests ordered:   Orders Placed This Encounter   Procedures  . T3, free  . T4, free  . CBC with Differential/Platelet    Standing Status: Future     Number of Occurrences:      Standing Expiration Date: 12/18/2015  . Comprehensive metabolic panel    Standing Status: Future     Number of Occurrences:      Standing Expiration Date: 12/18/2015    Order Specific Question:  Has the patient fasted?    Answer:  Yes  . Hemoglobin A1c    Standing Status: Future     Number of Occurrences:      Standing Expiration Date: 12/18/2015  . Lipid panel    Standing Status: Future     Number of Occurrences:      Standing Expiration Date: 12/18/2015    Order Specific Question:  Has the patient fasted?    Answer:  Yes    Next appt:  3 mos med mgt with labs before  [place] Dachelle Molzahn, D.O. [facility] [address], [place] [address] Cell Phone (Mon-Fri 8am-5pm):  [phone] On Call:  [phone] & follow prompts after 5pm & weekends Office Phone:  [phone] Office Fax:  [phone]

## 2015-06-18 LAB — T4, FREE: Free T4: 1.09 ng/dL (ref 0.82–1.77)

## 2015-06-18 LAB — T3, FREE: T3, Free: 2.6 pg/mL (ref 2.0–4.4)

## 2015-06-21 ENCOUNTER — Ambulatory Visit (INDEPENDENT_AMBULATORY_CARE_PROVIDER_SITE_OTHER): Payer: Medicare Other | Admitting: Pharmacotherapy

## 2015-06-21 ENCOUNTER — Encounter: Payer: Self-pay | Admitting: Pharmacotherapy

## 2015-06-21 VITALS — BP 118/66 | HR 74 | Temp 98.7°F | Resp 20 | Ht 67.0 in | Wt 199.6 lb

## 2015-06-21 DIAGNOSIS — I82409 Acute embolism and thrombosis of unspecified deep veins of unspecified lower extremity: Secondary | ICD-10-CM | POA: Diagnosis not present

## 2015-06-21 DIAGNOSIS — L603 Nail dystrophy: Secondary | ICD-10-CM | POA: Diagnosis not present

## 2015-06-21 DIAGNOSIS — I739 Peripheral vascular disease, unspecified: Secondary | ICD-10-CM | POA: Diagnosis not present

## 2015-06-21 LAB — POCT INR: INR: 2

## 2015-06-21 NOTE — Progress Notes (Signed)
   Subjective:    Patient ID: Andrea [name], female    DOB: January 29, 1935, 80 y.o.   MRN: [id number]  HPI Last INR was low. Coumadin was increased to 5mg  QD except 2.5mg  T/F Denies missed doses Denies unusual bleeding or brusing Denies CP, SOB, falls Consistent with vitamin K intake   Review of Systems  HENT: Negative for nosebleeds.   Respiratory: Negative for shortness of breath.   Cardiovascular: Negative for chest pain.  Gastrointestinal: Negative for anal bleeding.  Genitourinary: Negative for hematuria.  Hematological: Does not bruise/bleed easily.       Objective:   Physical Exam  Constitutional: She is oriented to person, place, and time. She appears well-developed and well-nourished.  HENT:  Right Ear: External ear normal.  Left Ear: External ear normal.  Cardiovascular: Normal rate, regular rhythm and normal heart sounds.   Pulmonary/Chest: Effort normal and breath sounds normal.  Neurological: She is alert and oriented to person, place, and time.  Skin: Skin is warm and dry.  Psychiatric: She has a normal mood and affect. Her behavior is normal. Judgment and thought content normal.  Vitals reviewed.    BP:  118/66  HR:  74  Wt:  199lb INR 2.0     Assessment & Plan:  1.  INR at goal 2-3 2.  Continue Coumadin 5mg  qD except 2.5mg  T/F 3.  RTC in 1 month

## 2015-06-21 NOTE — Patient Instructions (Signed)
INR 2.0 Continue Coumadin 5mg  daily except 2.5mg  on Tuesdays / Fridays

## 2015-07-12 DIAGNOSIS — F3181 Bipolar II disorder: Secondary | ICD-10-CM | POA: Diagnosis not present

## 2015-07-16 ENCOUNTER — Ambulatory Visit: Payer: Medicare Other | Admitting: Internal Medicine

## 2015-07-19 DIAGNOSIS — N3281 Overactive bladder: Secondary | ICD-10-CM | POA: Diagnosis not present

## 2015-07-26 ENCOUNTER — Other Ambulatory Visit: Payer: Self-pay | Admitting: Internal Medicine

## 2015-08-02 ENCOUNTER — Encounter: Payer: Self-pay | Admitting: Pharmacotherapy

## 2015-08-02 ENCOUNTER — Ambulatory Visit (INDEPENDENT_AMBULATORY_CARE_PROVIDER_SITE_OTHER): Payer: Medicare Other | Admitting: Pharmacotherapy

## 2015-08-02 VITALS — BP 102/66 | HR 65 | Temp 99.1°F | Resp 20 | Ht 67.0 in | Wt 195.0 lb

## 2015-08-02 DIAGNOSIS — G894 Chronic pain syndrome: Secondary | ICD-10-CM

## 2015-08-02 DIAGNOSIS — I82409 Acute embolism and thrombosis of unspecified deep veins of unspecified lower extremity: Secondary | ICD-10-CM

## 2015-08-02 LAB — POCT INR: INR: 1.7

## 2015-08-02 MED ORDER — WARFARIN SODIUM 5 MG PO TABS: ORAL_TABLET | ORAL | Status: DC

## 2015-08-02 MED ORDER — OXYCODONE-ACETAMINOPHEN 10-325 MG PO TABS: ORAL_TABLET | ORAL | Status: DC

## 2015-08-02 NOTE — Patient Instructions (Signed)
INR 1.7 Increase Coumadin 5mg  daily except 2.5mg  (1/2 tablet) on Fridays

## 2015-08-02 NOTE — Progress Notes (Signed)
   Subjective:    Patient ID: Andrea [name], female    DOB: [birth year], 80 y.o.   MRN: [id number]  HPI Last INR on 06/21/15 was OK at 2.0 Current Coumadin dose is 5mg  QD except 2.5mg  T/F Denies missed doses Denies unusual bleeding or bruising. Denies CP, SOB, falls Consistent with vitamin K intake   Review of Systems  Constitutional: Positive for fever. Negative for chills.  HENT: Negative for nosebleeds.   Respiratory: Negative for shortness of breath.   Cardiovascular: Negative for chest pain.  Genitourinary: Negative for hematuria.  Hematological: Does not bruise/bleed easily.       Objective:   Physical Exam  Constitutional: She is oriented to person, place, and time. She appears well-developed and well-nourished.  HENT:  Right Ear: External ear normal.  Left Ear: External ear normal.  Cardiovascular: Normal rate, regular rhythm and normal heart sounds.   Pulmonary/Chest: Effort normal and breath sounds normal.  Neurological: She is alert and oriented to person, place, and time.  Skin: Skin is warm and dry.  Psychiatric: She has a normal mood and affect. Her behavior is normal. Judgment and thought content normal.  Vitals reviewed.  INR 1.7 BP:  102/ 66  HR:  65  Wt:  195lb       Assessment & Plan:  1.  INR below target 2-3 2.  Increase Coumadin 5mg  qD except 2.5mg  Fridays 3.  RTC 4 weeks

## 2015-08-09 ENCOUNTER — Ambulatory Visit (INDEPENDENT_AMBULATORY_CARE_PROVIDER_SITE_OTHER): Payer: Medicare Other | Admitting: Internal Medicine

## 2015-08-09 ENCOUNTER — Encounter: Payer: Self-pay | Admitting: Internal Medicine

## 2015-08-09 VITALS — BP 98/62 | HR 81 | Temp 98.5°F | Resp 20 | Ht 67.0 in | Wt 193.8 lb

## 2015-08-09 DIAGNOSIS — J0100 Acute maxillary sinusitis, unspecified: Secondary | ICD-10-CM

## 2015-08-09 DIAGNOSIS — R509 Fever, unspecified: Secondary | ICD-10-CM | POA: Diagnosis not present

## 2015-08-09 NOTE — Progress Notes (Signed)
Patient ID: Andrea [name], female   DOB: 01-25-1935, 80 y.o.   MRN: [id number]   Location:  [facility]  Code Status: DNR Goals of Care: Advanced Directive information Does patient have an advance directive?: No, Does patient want to make changes to advanced directive?: Yes - information given   Chief Complaint  Patient presents with  . Acute Visit    running a low grade fever  . OTHER    Discuss Advanced Directive    HPI: Patient is a 80 y.o. white female seen in the office today for an acute visit.    Says she's had 3 weeks of a low grade temp and has no energy.  Had a little ear pain at first and sore throat, but they went away.  Temp has been 99.9.  Has been blowing her nose a lot and it's been clear.  BP low today at 98/62.  Can hardly get one foot in front of the other.  Not wearing hearing aides.  Need to get advance directive document notarized.  Have already completed it.    Review of Systems:  Review of Systems  Constitutional: Positive for fever and malaise/fatigue. Negative for chills.  HENT: Positive for congestion and hearing loss.        Ear pain and sore throat gone now  Eyes: Negative for blurred vision.       Glasses  Respiratory: Positive for cough and sputum production. Negative for shortness of breath.        Clear sputum  Cardiovascular: Positive for leg swelling. Negative for chest pain.       Unchanged  Gastrointestinal: Negative for nausea, vomiting, abdominal pain and diarrhea.  Genitourinary: Negative for dysuria.  Musculoskeletal: Negative for falls.  Skin: Negative for itching and rash.  Neurological: Positive for weakness. Negative for dizziness and loss of consciousness.       Difficulty getting around due to weakness; walking with cane    Past Medical History  Diagnosis Date  . Hypotension   . Depression with anxiety   . Cough     Wert-onset 08/2009, as of 2014- resolved   . Deep venous thrombosis      post op, rec'd/needed  blood thinner   . Cervicalgia   . Hypothyroidism   . Blood transfusion   . Constipation   . Shortness of breath   . Anxiety   . Dysrhythmia   . Anemia   . Depression   . Lumbar radiculopathy 09/26/2011  . Headache(784.0) 09/27/2011  . Pneumonia     never been in hosp. for pneumonia   . Sleep apnea     doesn't use CPAP any longer, since weight loss  . Arthritis     hands, spine   . History of stress test     15-20 yrs.ago   . Falls frequently     pt. reports that she was falling 3 times a day, has had PT at a facility for a while & now is getting home PT 2-3 times/ week   . Right hand fracture     Monday  . Prediabetes     Past Surgical History  Procedure Laterality Date  . Replacement total knee bilateral    . Appendectomy    . Breast excisional biopsy      breast bxs on left x 2, breast bx of right x 1-all benign  . Laparoscopic ovarian cystectomy      not done by laparscopy-abdominal  incision  . Joint replacement  [id number]    bilateral  . Tonsillectomy    . Eye surgery      cataracts bilateral /w IOL  . Hernia repair      umbilical hernia- 1610  . Back surgery      2000  . Anterior cervical decomp/discectomy fusion N/A 07/09/2013    Procedure: ANTERIOR CERVICAL DECOMPRESSION/DISCECTOMY FUSION 3 LEVELS;  Surgeon: Sinclair Ship, MD;  Location: [place];  Service: Orthopedics;  Laterality: N/A;  Anterior cervical decompression fusion, cervical 3-4, cervical 4-5-, cervical 5-6 with instrumentation, allograft.    Allergies  Allergen Reactions  . Clindamycin Hcl Other (See Comments)    REACTION: swelling, pt. Reports that she had a 16 lb. weightgain in one day   Medications: Patient's Medications  New Prescriptions   No medications on file  Previous Medications   BREXPIPRAZOLE (REXULTI) 0.5 MG TABS    Take 1 tablet by mouth at bedtime.   BUSPIRONE (BUSPAR) 15 MG TABLET    Take 15 mg by mouth daily.   CALCIUM CITRATE PO    Take 600  mg by mouth 2 (two) times daily.   CLONAZEPAM (KLONOPIN) 0.5 MG TABLET    Take 1 tablet by mouth as needed in middle of the nite to go back to sleep   DULOXETINE (CYMBALTA) 60 MG CAPSULE    Take 2 capsules (120 mg total) by mouth daily.   FUROSEMIDE (LASIX) 40 MG TABLET    TAKE 1/2 TABLET (20 MG TOTAL) BY MOUTH DAILY.   LEVOTHYROXINE (SYNTHROID, LEVOTHROID) 50 MCG TABLET    TAKE 1 TABLET (50 MCG TOTAL) BY MOUTH EVERY MORNING.   MELOXICAM (MOBIC) 15 MG TABLET    TAKE 1 TABLET EVERY DAY   MIRABEGRON ER (MYRBETRIQ) 25 MG TB24 TABLET    Take 25 mg by mouth daily.   MIRTAZAPINE (REMERON) 15 MG TABLET    Take 7.5 mg by mouth at bedtime.   MULTIPLE VITAMINS-MINERALS (MULTIVITAMIN PO)    Take 1 tablet by mouth.   OXYCODONE-ACETAMINOPHEN (PERCOCET) 10-325 MG PER TABLET    Take 1-2 tablets by mouth every 4-6 hours as needed for Arthritis pain   TRAZODONE (DESYREL) 100 MG TABLET    Take 3 tablets by mouth at bedtime   WARFARIN (COUMADIN) 5 MG TABLET    Take 1 tablet daily except 1/2 tablet on  Fridays  Modified Medications   No medications on file  Discontinued Medications   No medications on file    Physical Exam: Filed Vitals:   08/09/15 0943  BP: 98/62  Pulse: 81  Temp: 98.5 F (36.9 C)  TempSrc: Oral  Resp: 20  Height: 5\' 7"  (1.702 m)  Weight: 193 lb 12.8 oz (87.907 kg)  SpO2: 96%   Physical Exam  Constitutional: She is oriented to person, place, and time. She appears well-developed and well-nourished.  Appears ill today  HENT:  Head: Normocephalic and atraumatic.  Right Ear: External ear normal.  Left Ear: External ear normal.  Mouth/Throat: Oropharynx is clear and moist. No oropharyngeal exudate.  Nasal congestion  Eyes: Conjunctivae are normal.  glasses  Neck: Normal range of motion. Neck supple. No thyromegaly present.  Cardiovascular: Normal rate, regular rhythm, normal heart sounds and intact distal pulses.   Pulmonary/Chest: Effort normal and breath sounds normal. She has  no wheezes. She has no rales.  Musculoskeletal:  Weak and less steady; walking with cane  Lymphadenopathy:    She has no cervical adenopathy.  Right cervical: No superficial cervical, no deep cervical and no posterior cervical adenopathy present.      Left cervical: No superficial cervical, no deep cervical and no posterior cervical adenopathy present.       Right: No supraclavicular adenopathy present.       Left: No supraclavicular adenopathy present.  Neurological: She is alert and oriented to person, place, and time.  Skin: Skin is warm and dry. There is pallor.  Psychiatric: She has a normal mood and affect.    Labs reviewed: Basic Metabolic Panel:  Recent Labs  09/21/14 1249 10/12/14 1407 04/21/15 0819  NA 143 142 141  K 4.6 4.9 4.5  CL 97 100 102  CO2 25 28 25   GLUCOSE 102* 131* 99  BUN 31* 31* 26  CREATININE 0.98 1.07* 0.92  CALCIUM 10.0 9.4 9.3  TSH  --  1.090 3.650   Liver Function Tests:  Recent Labs  04/21/15 0819  AST 17  ALT 14  ALKPHOS 65  BILITOT 0.5  PROT 6.3   No results for input(s): LIPASE, AMYLASE in the last 8760 hours. No results for input(s): AMMONIA in the last 8760 hours. CBC:  Recent Labs  04/21/15 0828  WBC 5.3  NEUTROABS 3.3  HCT 39.4   Lipid Panel: No results for input(s): CHOL, HDL, LDLCALC, TRIG, CHOLHDL, LDLDIRECT in the last 8760 hours. Lab Results  Component Value Date   HGBA1C 5.7* 04/21/2015    Assessment/Plan 1. Low grade fever - differential includes sinus infection, pneumonia (w/o many symptoms), dvt (on coumadin with recently therapeutic INRs), and bone marrow abnormalities - CBC with Differential/Platelet - Comprehensive metabolic panel  2. Acute maxillary sinusitis, recurrence not specified - suspected as cause of #1, will obtain labs to rule out significant leukocytosis and bone marrow concerns -does not have DVT signs/symptoms so will not do [place] at this time - CBC with Differential/Platelet -  Comprehensive metabolic panel   Labs/tests ordered:   Orders Placed This Encounter  Procedures  . CBC with Differential/Platelet  . Comprehensive metabolic panel    Next appt:  Keep oct appt with labs before  Jacqueleen Pulver L. Jaquavis Felmlee, D.O. [facility] [address], [place] [address] Cell Phone (Mon-Fri 8am-5pm):  [phone] On Call:  [phone] & follow prompts after 5pm & weekends Office Phone:  [phone] Office Fax:  [phone]

## 2015-08-10 ENCOUNTER — Telehealth: Payer: Self-pay

## 2015-08-10 LAB — CBC WITH DIFFERENTIAL/PLATELET
Basophils Absolute: 0 10*3/uL (ref 0.0–0.2)
Basos: 1 %
EOS (ABSOLUTE): 0.2 10*3/uL (ref 0.0–0.4)
Eos: 2 %
Hematocrit: 41 % (ref 34.0–46.6)
Hemoglobin: 13.4 g/dL (ref 11.1–15.9)
Immature Grans (Abs): 0 10*3/uL (ref 0.0–0.1)
Immature Granulocytes: 0 %
Lymphocytes Absolute: 1.4 10*3/uL (ref 0.7–3.1)
Lymphs: 22 %
MCH: 29.3 pg (ref 26.6–33.0)
MCHC: 32.7 g/dL (ref 31.5–35.7)
MCV: 90 fL (ref 79–97)
Monocytes Absolute: 0.3 10*3/uL (ref 0.1–0.9)
Monocytes: 5 %
Neutrophils Absolute: 4.4 10*3/uL (ref 1.4–7.0)
Neutrophils: 70 %
Platelets: 228 10*3/uL (ref 150–379)
RBC: 4.58 x10E6/uL (ref 3.77–5.28)
RDW: 13.7 % (ref 12.3–15.4)
WBC: 6.3 10*3/uL (ref 3.4–10.8)

## 2015-08-10 LAB — COMPREHENSIVE METABOLIC PANEL
ALT: 10 IU/L (ref 0–32)
AST: 19 IU/L (ref 0–40)
Albumin/Globulin Ratio: 2.2 (ref 1.1–2.5)
Albumin: 4.3 g/dL (ref 3.5–4.8)
Alkaline Phosphatase: 65 IU/L (ref 39–117)
BUN/Creatinine Ratio: 32 — ABNORMAL HIGH (ref 11–26)
BUN: 31 mg/dL — ABNORMAL HIGH (ref 8–27)
Bilirubin Total: 0.2 mg/dL (ref 0.0–1.2)
CO2: 28 mmol/L (ref 18–29)
Calcium: 9.5 mg/dL (ref 8.7–10.3)
Chloride: 99 mmol/L (ref 97–108)
Creatinine, Ser: 0.98 mg/dL (ref 0.57–1.00)
GFR calc Af Amer: 64 mL/min/{1.73_m2} (ref >59)
GFR calc non Af Amer: 55 mL/min/{1.73_m2} — ABNORMAL LOW (ref >59)
Globulin, Total: 2 g/dL (ref 1.5–4.5)
Glucose: 91 mg/dL (ref 65–99)
Potassium: 4.4 mmol/L (ref 3.5–5.2)
Sodium: 142 mmol/L (ref 134–144)
Total Protein: 6.3 g/dL (ref 6.0–8.5)

## 2015-08-10 MED ORDER — AMOXICILLIN-POT CLAVULANATE 875-125 MG PO TABS: 1.0000 | ORAL_TABLET | Freq: Two times a day (BID) | ORAL | Status: DC

## 2015-08-10 NOTE — Telephone Encounter (Signed)
Discussed lab results with patient, patient verbalized understanding of results. ABX was sent to the pharmacy on file. Copy of labs mailed to patient in letter format.

## 2015-08-10 NOTE — Telephone Encounter (Signed)
-----   Message from Gayland Curry, DO sent at 08/10/2015  7:56 AM EDT ----- Fortunately, she does not have leukocytosis (a wbc count).  I'd like to treat her sinus infection due to her ongoing low grade temperature.  Begin Augmentin 875/125mg  po bid for 10 days.  #20.  Also, either eat yogurt daily or take a probiotic (last I checked, we had align samples adequate to cover her while she is on the antibiotic).  This should help prevent diarrhea, decrease risk of c diff afterwards and also prevent yeast infections vaginally.  Encourage hydration.  Other labs were normal.

## 2015-08-22 ENCOUNTER — Other Ambulatory Visit: Payer: Self-pay | Admitting: Internal Medicine

## 2015-09-06 ENCOUNTER — Encounter: Payer: Self-pay | Admitting: Pharmacotherapy

## 2015-09-06 ENCOUNTER — Ambulatory Visit (INDEPENDENT_AMBULATORY_CARE_PROVIDER_SITE_OTHER): Payer: Medicare Other | Admitting: Pharmacotherapy

## 2015-09-06 VITALS — BP 110/62 | HR 79 | Temp 98.8°F | Resp 20 | Ht 67.0 in | Wt 192.8 lb

## 2015-09-06 DIAGNOSIS — Z23 Encounter for immunization: Secondary | ICD-10-CM | POA: Diagnosis not present

## 2015-09-06 DIAGNOSIS — I82409 Acute embolism and thrombosis of unspecified deep veins of unspecified lower extremity: Secondary | ICD-10-CM

## 2015-09-06 DIAGNOSIS — H04123 Dry eye syndrome of bilateral lacrimal glands: Secondary | ICD-10-CM

## 2015-09-06 LAB — POCT INR: INR: 2

## 2015-09-06 NOTE — Progress Notes (Signed)
   Subjective:    Patient ID: Andrea [name], female    DOB: 04/14/1935, 80 y.o.   MRN: [id number]  HPI Last INR on 08/02/15 was low at 1.7 Coumadin was increased to 5mg  daily except 2.5mg  Fridays Denies missed doses Denies unusual bleeding or bruising. Denies CP, SOB, falls Consistent with vitamin K intake   Review of Systems  HENT: Negative for nosebleeds.   Respiratory: Negative for shortness of breath.   Cardiovascular: Negative for chest pain.  Genitourinary: Negative for hematuria.  Hematological: Does not bruise/bleed easily.       Objective:   Physical Exam  Constitutional: She is oriented to person, place, and time. She appears well-developed and well-nourished.  HENT:  Right Ear: External ear normal.  Left Ear: External ear normal.  Cardiovascular: Normal rate, regular rhythm and normal heart sounds.   Pulmonary/Chest: Effort normal and breath sounds normal.  Neurological: She is alert and oriented to person, place, and time.  Skin: Skin is warm and dry.  Psychiatric: She has a normal mood and affect. Her behavior is normal. Judgment and thought content normal.  Vitals reviewed.  BP:  110/62  HR:  79  Wt:  192lb INR 2.0       Assessment & Plan:  1.  INR at goal 2-3 2.  Continue Coumadin 5mg  QD except 2.5mg  Fridays 3.  INR 1 month

## 2015-09-06 NOTE — Patient Instructions (Signed)
INR 2.0 Continue Coumadin 5mg  daily except 1/2 tablets (2.5mg ) on Fridays

## 2015-09-13 ENCOUNTER — Other Ambulatory Visit: Payer: PRIVATE HEALTH INSURANCE

## 2015-09-14 ENCOUNTER — Other Ambulatory Visit: Payer: Self-pay | Admitting: Internal Medicine

## 2015-09-15 ENCOUNTER — Other Ambulatory Visit: Payer: Medicare Other

## 2015-09-15 DIAGNOSIS — R739 Hyperglycemia, unspecified: Secondary | ICD-10-CM

## 2015-09-15 DIAGNOSIS — E669 Obesity, unspecified: Secondary | ICD-10-CM | POA: Diagnosis not present

## 2015-09-15 DIAGNOSIS — G894 Chronic pain syndrome: Secondary | ICD-10-CM

## 2015-09-16 LAB — LIPID PANEL
Chol/HDL Ratio: 3.7 ratio units (ref 0.0–4.4)
Cholesterol, Total: 169 mg/dL (ref 100–199)
HDL: 46 mg/dL (ref >39)
LDL Calculated: 102 mg/dL — ABNORMAL HIGH (ref 0–99)
Triglycerides: 107 mg/dL (ref 0–149)
VLDL Cholesterol Cal: 21 mg/dL (ref 5–40)

## 2015-09-16 LAB — CBC WITH DIFFERENTIAL/PLATELET
Basophils Absolute: 0 10*3/uL (ref 0.0–0.2)
Basos: 0 %
EOS (ABSOLUTE): 0.2 10*3/uL (ref 0.0–0.4)
Eos: 4 %
Hematocrit: 40.2 % (ref 34.0–46.6)
Hemoglobin: 13.2 g/dL (ref 11.1–15.9)
Immature Grans (Abs): 0 10*3/uL (ref 0.0–0.1)
Immature Granulocytes: 0 %
Lymphocytes Absolute: 2.1 10*3/uL (ref 0.7–3.1)
Lymphs: 41 %
MCH: 29.3 pg (ref 26.6–33.0)
MCHC: 32.8 g/dL (ref 31.5–35.7)
MCV: 89 fL (ref 79–97)
Monocytes Absolute: 0.3 10*3/uL (ref 0.1–0.9)
Monocytes: 7 %
Neutrophils Absolute: 2.4 10*3/uL (ref 1.4–7.0)
Neutrophils: 48 %
Platelets: 199 10*3/uL (ref 150–379)
RBC: 4.5 x10E6/uL (ref 3.77–5.28)
RDW: 14.3 % (ref 12.3–15.4)
WBC: 5.1 10*3/uL (ref 3.4–10.8)

## 2015-09-16 LAB — COMPREHENSIVE METABOLIC PANEL
ALT: 8 IU/L (ref 0–32)
AST: 18 IU/L (ref 0–40)
Albumin/Globulin Ratio: 2.1 (ref 1.1–2.5)
Albumin: 4 g/dL (ref 3.5–4.8)
Alkaline Phosphatase: 71 IU/L (ref 39–117)
BUN/Creatinine Ratio: 24 (ref 11–26)
BUN: 22 mg/dL (ref 8–27)
Bilirubin Total: <0.2 mg/dL (ref 0.0–1.2)
CO2: 27 mmol/L (ref 18–29)
Calcium: 9.2 mg/dL (ref 8.7–10.3)
Chloride: 100 mmol/L (ref 97–106)
Creatinine, Ser: 0.91 mg/dL (ref 0.57–1.00)
GFR calc Af Amer: 70 mL/min/{1.73_m2} (ref >59)
GFR calc non Af Amer: 61 mL/min/{1.73_m2} (ref >59)
Globulin, Total: 1.9 g/dL (ref 1.5–4.5)
Glucose: 89 mg/dL (ref 65–99)
Potassium: 4.3 mmol/L (ref 3.5–5.2)
Sodium: 141 mmol/L (ref 136–144)
Total Protein: 5.9 g/dL — ABNORMAL LOW (ref 6.0–8.5)

## 2015-09-16 LAB — HEMOGLOBIN A1C
Est. average glucose Bld gHb Est-mCnc: 114 mg/dL
Hgb A1c MFr Bld: 5.6 % (ref 4.8–5.6)

## 2015-09-17 ENCOUNTER — Encounter: Payer: Self-pay | Admitting: Internal Medicine

## 2015-09-17 ENCOUNTER — Ambulatory Visit (INDEPENDENT_AMBULATORY_CARE_PROVIDER_SITE_OTHER): Payer: Medicare Other | Admitting: Internal Medicine

## 2015-09-17 VITALS — BP 108/70 | HR 78 | Temp 99.1°F | Ht 67.0 in | Wt 194.0 lb

## 2015-09-17 DIAGNOSIS — F32A Depression, unspecified: Secondary | ICD-10-CM

## 2015-09-17 DIAGNOSIS — F329 Major depressive disorder, single episode, unspecified: Secondary | ICD-10-CM | POA: Diagnosis not present

## 2015-09-17 DIAGNOSIS — E785 Hyperlipidemia, unspecified: Secondary | ICD-10-CM

## 2015-09-17 DIAGNOSIS — E039 Hypothyroidism, unspecified: Secondary | ICD-10-CM | POA: Diagnosis not present

## 2015-09-17 DIAGNOSIS — E1169 Type 2 diabetes mellitus with other specified complication: Secondary | ICD-10-CM | POA: Diagnosis not present

## 2015-09-17 DIAGNOSIS — I480 Paroxysmal atrial fibrillation: Secondary | ICD-10-CM

## 2015-09-17 DIAGNOSIS — G894 Chronic pain syndrome: Secondary | ICD-10-CM

## 2015-09-17 DIAGNOSIS — G4700 Insomnia, unspecified: Secondary | ICD-10-CM | POA: Diagnosis not present

## 2015-09-17 NOTE — Progress Notes (Signed)
Patient ID: Andrea [name], female   DOB: [birth year], 80 y.o.   MRN: [id number]   Location: [place] Provider: Rexene Edison. Mariea Clonts, D.O., C.M.D.  Code Status: DNR Goals of Care: Advanced Directive information Does patient have an advance directive?: No, Does patient want to make changes to advanced directive?: Yes - information given (Given at previous visit )  Chief Complaint  Patient presents with  . Medical Management of Chronic Issues    3 month follow-up, discuss labs (copy printed)     HPI: Patient is a 80 y.o. female seen in the office today for med mgt of chronic diseases and to review her lab results.    Still running a 99 temp even after sinus infection treatment with augmentin due to her symptoms last visit.  Urine fine.  Not coughing.  No new leg pains or dyspnea and sats normal.  Feels better than last visit.  Does know when she's in afib, but only lasts for under 2 mins.  Has her chronic arthritic pain--takes her medication at bedtime for her back primarily.  Depression:  Had one week of bad depression--first time in 2 years.  Says overall in her life, she's been more depressed than not depressed.  She doesn't know what brought it on.  Resolved.    Sleeping well with rexulti at hs.  Bladder:  Urologist retired.  Continues on myrbetriq--takes it every other day due to cost.  Dry eye:  restasis expensive--$192 and insurance had already paid $200.  Can't read well esp small print and computer.    Review of Systems:  Review of Systems  Constitutional: Positive for fever. Negative for chills and malaise/fatigue.  Eyes:       Dry eyes  Respiratory: Negative for cough and shortness of breath.   Cardiovascular: Positive for palpitations. Negative for chest pain.       Knows when in afib but shortlived episodes  Gastrointestinal: Positive for constipation. Negative for abdominal pain, blood in stool and melena.  Genitourinary: Positive for urgency and frequency.  Negative for dysuria.  Musculoskeletal: Negative for falls.  Skin: Negative for rash.  Neurological: Negative for dizziness, weakness and headaches.  Psychiatric/Behavioral: Positive for depression and memory loss. The patient does not have insomnia.     Past Medical History  Diagnosis Date  . Hypotension   . Depression with anxiety   . Cough     Wert-onset 08/2009, as of 2014- resolved   . Deep venous thrombosis ([place])     post op, rec'd/needed  blood thinner   . Cervicalgia   . Hypothyroidism   . Blood transfusion   . Constipation   . Shortness of breath   . Anxiety   . Dysrhythmia   . Anemia   . Depression   . Lumbar radiculopathy 09/26/2011  . Headache(784.0) 09/27/2011  . Pneumonia     never been in hosp. for pneumonia   . Sleep apnea     doesn't use CPAP any longer, since weight loss  . Arthritis     hands, spine   . History of stress test     15-20 yrs.ago   . Falls frequently     pt. reports that she was falling 3 times a day, has had PT at a facility for a while & now is getting home PT 2-3 times/ week   . Right hand fracture     Monday  . Prediabetes     Past Surgical History  Procedure Laterality Date  .  Replacement total knee bilateral    . Appendectomy    . Breast excisional biopsy      breast bxs on left x 2, breast bx of right x 1-all benign  . Laparoscopic ovarian cystectomy      not done by laparscopy-abdominal incision  . Joint replacement  [id number]    bilateral  . Tonsillectomy    . Eye surgery      cataracts bilateral /w IOL  . Hernia repair      umbilical hernia- 0932  . Back surgery      2000  . Anterior cervical decomp/discectomy fusion N/A 07/09/2013    Procedure: ANTERIOR CERVICAL DECOMPRESSION/DISCECTOMY FUSION 3 LEVELS;  Surgeon: Sinclair Ship, MD;  Location: [place];  Service: Orthopedics;  Laterality: N/A;  Anterior cervical decompression fusion, cervical 3-4, cervical 4-5-, cervical 5-6 with instrumentation, allograft.     Allergies  Allergen Reactions  . Clindamycin Hcl Other (See Comments)    REACTION: swelling, pt. Reports that she had a 16 lb. weightgain in one day      Medication List       This list is accurate as of: 09/17/15 11:04 AM.  Always use your most recent med list.               Brexpiprazole 0.5 MG Tabs  Commonly known as:  REXULTI  Take 1 tablet by mouth at bedtime.     busPIRone 15 MG tablet  Commonly known as:  BUSPAR  Take 15 mg by mouth daily.     CALCIUM CITRATE PO  Take 600 mg by mouth 2 (two) times daily.     clonazePAM 0.5 MG tablet  Commonly known as:  KLONOPIN  Take 1 tablet by mouth as needed in middle of the nite to go back to sleep     DULoxetine 60 MG capsule  Commonly known as:  CYMBALTA  Take 2 capsules (120 mg total) by mouth daily.     furosemide 40 MG tablet  Commonly known as:  LASIX  TAKE 1/2 TABLET (20 MG TOTAL) BY MOUTH DAILY.     levothyroxine 50 MCG tablet  Commonly known as:  SYNTHROID, LEVOTHROID  TAKE 1 TABLET (50 MCG TOTAL) BY MOUTH EVERY MORNING.     meloxicam 15 MG tablet  Commonly known as:  MOBIC  TAKE 1 TABLET EVERY DAY     mirtazapine 15 MG tablet  Commonly known as:  REMERON  Take 7.5 mg by mouth at bedtime.     MULTIVITAMIN PO  Take 1 tablet by mouth.     MYRBETRIQ 25 MG Tb24 tablet  Generic drug:  mirabegron ER  Take 25 mg by mouth daily.     oxyCODONE-acetaminophen 10-325 MG tablet  Commonly known as:  PERCOCET  Take 1-2 tablets by mouth every 4-6 hours as needed for Arthritis pain     RESTASIS 0.05 % ophthalmic emulsion  Generic drug:  cycloSPORINE  PLACE 1 DROP INTO BOTH EYES TWICE A DAY     traZODone 100 MG tablet  Commonly known as:  DESYREL  Take 3 tablets by mouth at bedtime     warfarin 5 MG tablet  Commonly known as:  COUMADIN  Take 1 tablet daily except 1/2 tablet on  Fridays        Health Maintenance  Topic Date Due  . ZOSTAVAX  06/15/1997  . TETANUS/TDAP  11/21/2015  . INFLUENZA  VACCINE  06/20/2016  . DEXA SCAN  Completed  . PNA vac  Low Risk Adult  Completed    Physical Exam: Filed Vitals:   09/17/15 1057  BP: 108/70  Pulse: 78  Temp: 99.1 F (37.3 C)  TempSrc: Oral  Height: 5\' 7"  (1.702 m)  Weight: 194 lb (87.998 kg)  SpO2: 99%   Body mass index is 30.38 kg/(m^2). Physical Exam  Constitutional: She is oriented to person, place, and time. She appears well-developed and well-nourished. No distress.  HENT:  Head: Normocephalic and atraumatic.  Eyes:  glasses  Cardiovascular: Normal rate, regular rhythm, normal heart sounds and intact distal pulses.   No edema or homan's sign  Pulmonary/Chest: Effort normal and breath sounds normal. No respiratory distress.  Abdominal: Soft. Bowel sounds are normal.  Musculoskeletal: Normal range of motion.  walks with cane  Neurological: She is alert and oriented to person, place, and time.  Skin: There is pallor.  Psychiatric: She has a normal mood and affect.    Labs reviewed: Basic Metabolic Panel:  Recent Labs  10/12/14 1407 04/21/15 0819 08/09/15 1027 09/15/15 0816  NA 142 141 142 141  K 4.9 4.5 4.4 4.3  CL 100 102 99 100  CO2 28 25 28 27   GLUCOSE 131* 99 91 89  BUN 31* 26 31* 22  CREATININE 1.07* 0.92 0.98 0.91  CALCIUM 9.4 9.3 9.5 9.2  TSH 1.090 3.650  --   --    Liver Function Tests:  Recent Labs  04/21/15 0819 08/09/15 1027 09/15/15 0816  AST 17 19 18   ALT 14 10 8   ALKPHOS 65 65 71  BILITOT 0.5 0.2 <0.2  PROT 6.3 6.3 5.9*  ALBUMIN 4.1 4.3 4.0   No results for input(s): LIPASE, AMYLASE in the last 8760 hours. No results for input(s): AMMONIA in the last 8760 hours. CBC:  Recent Labs  04/21/15 0828 08/09/15 1027 09/15/15 0816  WBC 5.3 6.3 5.1  NEUTROABS 3.3 4.4 2.4  HCT 39.4 41.0 40.2   Lipid Panel:  Recent Labs  09/15/15 0816  CHOL 169  HDL 46  LDLCALC 102*  TRIG 107  CHOLHDL 3.7   Lab Results  Component Value Date   HGBA1C 5.6 09/15/2015      Assessment/Plan 1. Hyperlipidemia associated with type 2 diabetes mellitus ([place]) -could not tolerate statins previously so continue to work on diet and exercise  2. Paroxysmal a-fib ([place]) -continues on coumadin with INR monitoring through [employer] here--last was exactly 2 on 10/17  -knows when she has it -not on anything for rate control  3. Chronic pain syndrome -stable with cymbalta, mobic, prn percocet which was last filled 9/12  4. Insomnia -doing much better since rexulti was started by psych and trazodone  5. Depression -cont buspar for anxiety, cymbalta, klonopin also for anxiety, remeron, and trazodone -monitor for serotonin syndrome with all of these agents in combination (? Causing low grade temp)  6. Hypothyroidism, unspecified hypothyroidism type -cont current synthroid Lab Results  Component Value Date   TSH 3.650 04/21/2015    Labs/tests ordered:  No orders of the defined types were placed in this encounter.    Next appt:  [name] Nov, me Jan  Jerra Huckeby L. Kanden Carey, D.O. [facility] [address], [place] [address] Cell Phone (Mon-Fri 8am-5pm):  [phone] On Call:  [phone] & follow prompts after 5pm & weekends Office Phone:  [phone] Office Fax:  [phone]

## 2015-09-22 DIAGNOSIS — H16223 Keratoconjunctivitis sicca, not specified as Sjogren's, bilateral: Secondary | ICD-10-CM | POA: Diagnosis not present

## 2015-09-23 ENCOUNTER — Other Ambulatory Visit: Payer: Medicare Other

## 2015-09-28 DIAGNOSIS — F3181 Bipolar II disorder: Secondary | ICD-10-CM | POA: Diagnosis not present

## 2015-10-11 ENCOUNTER — Ambulatory Visit (INDEPENDENT_AMBULATORY_CARE_PROVIDER_SITE_OTHER): Payer: Medicare Other | Admitting: Pharmacotherapy

## 2015-10-11 ENCOUNTER — Encounter: Payer: Self-pay | Admitting: Pharmacotherapy

## 2015-10-11 VITALS — BP 118/70 | HR 65 | Temp 98.3°F | Resp 20 | Wt 184.0 lb

## 2015-10-11 DIAGNOSIS — I82409 Acute embolism and thrombosis of unspecified deep veins of unspecified lower extremity: Secondary | ICD-10-CM | POA: Diagnosis not present

## 2015-10-11 DIAGNOSIS — I480 Paroxysmal atrial fibrillation: Secondary | ICD-10-CM

## 2015-10-11 LAB — POCT INR: INR: 2.1

## 2015-10-11 NOTE — Progress Notes (Signed)
   Subjective:    Patient ID: Andrea [name], female    DOB: 05/04/1935, 80 y.o.   MRN: [id number]  HPI Last INR on 09/06/15 was OK at 2.0 Current Coumadin dose is 5mg  QD except 2.5mg  Fridays Denies missed doses Denies unusual bleeding or bruising. Did have a fall -- no injury. Consistent with vitamin K.   Review of Systems  HENT: Negative for nosebleeds.   Respiratory: Negative for shortness of breath.   Cardiovascular: Negative for chest pain and palpitations.  Genitourinary: Negative for hematuria.  Hematological: Does not bruise/bleed easily.       Objective:   Physical Exam  Constitutional: She is oriented to person, place, and time. She appears well-developed and well-nourished.  HENT:  Right Ear: External ear normal.  Left Ear: External ear normal.  Cardiovascular: Normal rate, regular rhythm and normal heart sounds.   Pulmonary/Chest: Effort normal and breath sounds normal.  Neurological: She is alert and oriented to person, place, and time.  Skin: Skin is warm and dry.  Psychiatric: She has a normal mood and affect. Her behavior is normal. Judgment and thought content normal.  Vitals reviewed.    BP: 118/70  HR: 65  Wt: 184lb INR 2.1     Assessment & Plan:  1.  INR at goal 2-3 2.  Continue coumadin 5mg  QD except 2.5mg  Fridays 3.  RTC 1 month

## 2015-10-11 NOTE — Patient Instructions (Signed)
INR 2.1 Continue Coumadin 5mg  daily except 2.5mg  (1/2 tablet) on Fridays

## 2015-10-27 DIAGNOSIS — N3942 Incontinence without sensory awareness: Secondary | ICD-10-CM | POA: Diagnosis not present

## 2015-11-08 ENCOUNTER — Ambulatory Visit: Payer: Medicare Other | Admitting: Pharmacotherapy

## 2015-11-21 ENCOUNTER — Other Ambulatory Visit: Payer: Self-pay | Admitting: Internal Medicine

## 2015-11-24 DIAGNOSIS — L84 Corns and callosities: Secondary | ICD-10-CM | POA: Diagnosis not present

## 2015-11-24 DIAGNOSIS — L603 Nail dystrophy: Secondary | ICD-10-CM | POA: Diagnosis not present

## 2015-11-24 DIAGNOSIS — I739 Peripheral vascular disease, unspecified: Secondary | ICD-10-CM | POA: Diagnosis not present

## 2015-12-06 ENCOUNTER — Ambulatory Visit: Payer: Medicare Other | Admitting: Pharmacotherapy

## 2015-12-13 ENCOUNTER — Ambulatory Visit (INDEPENDENT_AMBULATORY_CARE_PROVIDER_SITE_OTHER): Payer: Medicare Other | Admitting: Pharmacotherapy

## 2015-12-13 ENCOUNTER — Encounter: Payer: Self-pay | Admitting: Pharmacotherapy

## 2015-12-13 VITALS — BP 118/78 | HR 72 | Temp 98.9°F | Ht 64.0 in | Wt 192.2 lb

## 2015-12-13 DIAGNOSIS — I480 Paroxysmal atrial fibrillation: Secondary | ICD-10-CM

## 2015-12-13 DIAGNOSIS — I82409 Acute embolism and thrombosis of unspecified deep veins of unspecified lower extremity: Secondary | ICD-10-CM | POA: Diagnosis not present

## 2015-12-13 LAB — POCT INR: INR: 2

## 2015-12-13 NOTE — Progress Notes (Signed)
   Subjective:    Patient ID: Andrea [name], female    DOB: Mar 25, 1935, 81 y.o.   MRN: [id number]  HPI Last INR on 10/11/15 was OK at 2.1 Current coumadin dose is 5mg  qD except 2.5mg  Fridays Denies missed doses. Denies unusual bleeding or bruising  Denies CP, palpitations, SOB, falls Consistent with vitamin K intake   Review of Systems  HENT: Negative for nosebleeds.   Respiratory: Negative for shortness of breath.   Cardiovascular: Negative for chest pain and palpitations.  Genitourinary: Negative for hematuria.  Hematological: Does not bruise/bleed easily.       Objective:   Physical Exam  Constitutional: She is oriented to person, place, and time. She appears well-developed and well-nourished.  HENT:  Right Ear: External ear normal.  Left Ear: External ear normal.  Cardiovascular: Normal rate, regular rhythm and normal heart sounds.   Pulmonary/Chest: Effort normal and breath sounds normal.  Neurological: She is alert and oriented to person, place, and time.  Skin: Skin is warm and dry.  Psychiatric: She has a normal mood and affect. Her behavior is normal. Judgment and thought content normal.  Vitals reviewed.   INR 2.0 BP:  118/78  HR: 72  Wt: 192lb      Assessment & Plan:  1.  INR at goal 2-3 2.  Continue Coumadin 5mg  QD except 2.5mg  Fridays 3.  RTC 1 month

## 2015-12-13 NOTE — Patient Instructions (Signed)
INR 2.0 Continue Coumadin 5mg  daily except 2.5mg  (1/2 tablet) on Fridays

## 2015-12-20 ENCOUNTER — Ambulatory Visit (INDEPENDENT_AMBULATORY_CARE_PROVIDER_SITE_OTHER): Payer: Medicare Other | Admitting: Internal Medicine

## 2015-12-20 ENCOUNTER — Encounter: Payer: Self-pay | Admitting: Internal Medicine

## 2015-12-20 VITALS — BP 110/82 | HR 75 | Temp 99.6°F | Ht 64.0 in | Wt 190.0 lb

## 2015-12-20 DIAGNOSIS — G894 Chronic pain syndrome: Secondary | ICD-10-CM

## 2015-12-20 DIAGNOSIS — R509 Fever, unspecified: Secondary | ICD-10-CM

## 2015-12-20 DIAGNOSIS — R739 Hyperglycemia, unspecified: Secondary | ICD-10-CM

## 2015-12-20 DIAGNOSIS — F329 Major depressive disorder, single episode, unspecified: Secondary | ICD-10-CM | POA: Diagnosis not present

## 2015-12-20 DIAGNOSIS — I82409 Acute embolism and thrombosis of unspecified deep veins of unspecified lower extremity: Secondary | ICD-10-CM | POA: Diagnosis not present

## 2015-12-20 DIAGNOSIS — E039 Hypothyroidism, unspecified: Secondary | ICD-10-CM | POA: Diagnosis not present

## 2015-12-20 DIAGNOSIS — F32A Depression, unspecified: Secondary | ICD-10-CM

## 2015-12-20 DIAGNOSIS — I480 Paroxysmal atrial fibrillation: Secondary | ICD-10-CM

## 2015-12-20 MED ORDER — FUROSEMIDE 40 MG PO TABS: 40.0000 mg | ORAL_TABLET | Freq: Every day | ORAL | Status: DC

## 2015-12-20 MED ORDER — OXYCODONE-ACETAMINOPHEN 10-325 MG PO TABS: ORAL_TABLET | ORAL | Status: DC

## 2015-12-20 NOTE — Patient Instructions (Signed)
Use barrier cream over the sore area on your bottom.

## 2015-12-20 NOTE — Progress Notes (Signed)
Patient ID: Andrea [name], female   DOB: January 06, 1935, 81 y.o.   MRN: [id number]   Location: [facility] Provider: Rexene Edison. Mariea Clonts, D.O., C.M.D.  Code Status: DNR Goals of Care: Advanced Directive information Does patient have an advance directive?: No  Chief Complaint  Patient presents with  . Medical Management of Chronic Issues    medication management, blood sugar, depression, chronic pain, insomnia. Here with friend [name]    HPI: Patient is a 81 y.o. female seen in the office today for med mgt of chronic diseases.    About 8 wks ago, got up and was going to kitchen late at night.  Has three steps and fell backwards down the steps.  Thinks she had a concussion.  Head and bottom were hurting.  Slept until 5am, then couldn't get off the floor, knocked phone off table, called her son.  Had a headache for a couple days, but no other signs.  Hit the edge of the step up in her "crack" and has had big scabs and sore area since.  Healing gradually.  Was really bad when lying in bed.  Now only one side affected.  No change in mentation.  Needs her pain meds renewed.  Back hurts her at nighttime--takes then and once in a while one in the daytime.    Also needs her lasix renewed.  Depression:  spirits have been good.  Has had low grade temp of 98.7-99range since July except two visits.  Has not felt poorly.   Review of Systems:  ROS  Past Medical History  Diagnosis Date  . Hypotension   . Depression with anxiety   . Cough     Wert-onset 08/2009, as of 2014- resolved   . Deep venous thrombosis ([place])     post op, rec'd/needed  blood thinner   . Cervicalgia   . Hypothyroidism   . Blood transfusion   . Constipation   . Shortness of breath   . Anxiety   . Dysrhythmia   . Anemia   . Depression   . Lumbar radiculopathy 09/26/2011  . Headache(784.0) 09/27/2011  . Pneumonia     never been in hosp. for pneumonia   . Sleep apnea     doesn't use CPAP any longer, since weight loss   . Arthritis     hands, spine   . History of stress test     15-20 yrs.ago   . Falls frequently     pt. reports that she was falling 3 times a day, has had PT at a facility for a while & now is getting home PT 2-3 times/ week   . Right hand fracture     Monday  . Prediabetes     Past Surgical History  Procedure Laterality Date  . Replacement total knee bilateral    . Appendectomy    . Breast excisional biopsy      breast bxs on left x 2, breast bx of right x 1-all benign  . Laparoscopic ovarian cystectomy      not done by laparscopy-abdominal incision  . Joint replacement  [id number]    bilateral  . Tonsillectomy    . Eye surgery      cataracts bilateral /w IOL  . Hernia repair      umbilical hernia- [id number]  . Back surgery      2000  . Anterior cervical decomp/discectomy fusion N/A 07/09/2013    Procedure: ANTERIOR CERVICAL DECOMPRESSION/DISCECTOMY FUSION 3 LEVELS;  Surgeon: Lawrence Santiago  Dumonski, MD;  Location: [facility];  Service: Orthopedics;  Laterality: N/A;  Anterior cervical decompression fusion, cervical 3-4, cervical 4-5-, cervical 5-6 with instrumentation, allograft.    Allergies  Allergen Reactions  . Clindamycin Hcl Other (See Comments)    REACTION: swelling, pt. Reports that she had a 16 lb. weightgain in one day      Medication List       This list is accurate as of: 12/20/15  1:34 PM.  Always use your most recent med list.               Brexpiprazole 0.5 MG Tabs  Commonly known as:  REXULTI  Take 1 tablet by mouth at bedtime.     busPIRone 15 MG tablet  Commonly known as:  BUSPAR  Take 15 mg by mouth daily.     CALCIUM CITRATE PO  Take 600 mg by mouth 2 (two) times daily.     clonazePAM 0.5 MG tablet  Commonly known as:  KLONOPIN  Take 1 tablet by mouth as needed in middle of the nite to go back to sleep     DULoxetine 60 MG capsule  Commonly known as:  CYMBALTA  Take 2 capsules (120 mg total) by mouth daily.     furosemide 40 MG tablet    Commonly known as:  LASIX  TAKE 1 TABLET (40 MG TOTAL) BY MOUTH DAILY.     levothyroxine 50 MCG tablet  Commonly known as:  SYNTHROID, LEVOTHROID  TAKE 1 TABLET (50 MCG TOTAL) BY MOUTH EVERY MORNING.     meloxicam 15 MG tablet  Commonly known as:  MOBIC  TAKE 1 TABLET EVERY DAY     mirtazapine 15 MG tablet  Commonly known as:  REMERON  Take 7.5 mg by mouth at bedtime.     MULTIVITAMIN PO  Take 1 tablet by mouth.     MYRBETRIQ 25 MG Tb24 tablet  Generic drug:  mirabegron ER  Take 25 mg by mouth daily.     oxyCODONE-acetaminophen 10-325 MG tablet  Commonly known as:  PERCOCET  Take 1-2 tablets by mouth every 4-6 hours as needed for Arthritis pain     RESTASIS 0.05 % ophthalmic emulsion  Generic drug:  cycloSPORINE  PLACE 1 DROP INTO BOTH EYES TWICE A DAY     traZODone 100 MG tablet  Commonly known as:  DESYREL  Take 2 tablets by mouth at bedtime     warfarin 5 MG tablet  Commonly known as:  COUMADIN  Take 1 tablet daily except 1/2 tablet on  Fridays        Health Maintenance  Topic Date Due  . FOOT EXAM  06/16/1947  . OPHTHALMOLOGY EXAM  06/16/1947  . URINE MICROALBUMIN  06/16/1947  . ZOSTAVAX  06/15/1997  . TETANUS/TDAP  11/21/2015  . HEMOGLOBIN A1C  03/15/2016  . INFLUENZA VACCINE  06/20/2016  . DEXA SCAN  Completed  . PNA vac Low Risk Adult  Completed    Physical Exam: Filed Vitals:   12/20/15 1329  BP: 110/82  Pulse: 75  Temp: 99.6 F (37.6 C)  TempSrc: Oral  Height: 5\' 4"  (1.626 m)  Weight: 190 lb (86.183 kg)  SpO2: 94%   Body mass index is 32.6 kg/(m^2). Physical Exam  Labs reviewed: Basic Metabolic Panel:  Recent Labs  04/21/15 0819 08/09/15 1027 09/15/15 0816  NA 141 142 141  K 4.5 4.4 4.3  CL 102 99 100  CO2 25 28 27   GLUCOSE 99 91  89  BUN 26 31* 22  CREATININE 0.92 0.98 0.91  CALCIUM 9.3 9.5 9.2  TSH 3.650  --   --    Liver Function Tests:  Recent Labs  04/21/15 0819 08/09/15 1027 09/15/15 0816  AST 17 19 18   ALT  14 10 8   ALKPHOS 65 65 71  BILITOT 0.5 0.2 <0.2  PROT 6.3 6.3 5.9*  ALBUMIN 4.1 4.3 4.0   No results for input(s): LIPASE, AMYLASE in the last 8760 hours. No results for input(s): AMMONIA in the last 8760 hours. CBC:  Recent Labs  04/21/15 0828 08/09/15 1027 09/15/15 0816  WBC 5.3 6.3 5.1  NEUTROABS 3.3 4.4 2.4  HCT 39.4 41.0 40.2  MCV 88 90 89  PLT 223 228 199   Lipid Panel:  Recent Labs  09/15/15 0816  CHOL 169  HDL 46  LDLCALC 102*  TRIG 107  CHOLHDL 3.7   Lab Results  Component Value Date   HGBA1C 5.6 09/15/2015    Assessment/Plan 1. Chronic pain syndrome -her back is bothersome at night and sometimes in the morning--takes medication as in hpi -needed refill (last one was September)  2. Fever, low grade -has been since July -no clear etiology--no current symptoms of infection -has had urine studies, was treated for sinus infection and no resolution -does have chronic DVTs and that's probably why but INRs have been good  3. Depression -well controlled, in remission, on current regimen  4. DVT (deep venous thrombosis), unspecified laterality -cont coumadin and monitoring by [name]  5. Paroxysmal a-fib ([facility]) -brief 1-2 min spurts and resolves -continues on coumadin for DVTs anyway  6. Hypothyroidism, unspecified hypothyroidism type -TSH today -has been therapeutic historically  7. Hyperglycemia -is working on diet and has lost a few lbs -doing well with this -f/u hba1c today and cmp  Labs/tests ordered:   Orders Placed This Encounter  Procedures  . TSH  . Hemoglobin A1c  . Comprehensive metabolic panel  Next appt:  04/20/2016 for med mgt  Valdis Bevill L. Surie Suchocki, D.O. [facility] [address] [place], [place] [address] Cell Phone (Mon-Fri 8am-5pm):  [phone] On Call:  [phone] & follow prompts after 5pm & weekends Office Phone:  [phone] Office Fax:  [phone]

## 2015-12-21 LAB — COMPREHENSIVE METABOLIC PANEL
ALT: 10 IU/L (ref 0–32)
AST: 17 IU/L (ref 0–40)
Albumin/Globulin Ratio: 2.1 (ref 1.1–2.5)
Albumin: 4.2 g/dL (ref 3.5–4.8)
Alkaline Phosphatase: 70 IU/L (ref 39–117)
BUN/Creatinine Ratio: 22 (ref 11–26)
BUN: 22 mg/dL (ref 8–27)
Bilirubin Total: 0.2 mg/dL (ref 0.0–1.2)
CO2: 27 mmol/L (ref 18–29)
Calcium: 9.2 mg/dL (ref 8.7–10.3)
Chloride: 98 mmol/L (ref 96–106)
Creatinine, Ser: 1.02 mg/dL — ABNORMAL HIGH (ref 0.57–1.00)
GFR calc Af Amer: 61 mL/min/{1.73_m2} (ref >59)
GFR calc non Af Amer: 53 mL/min/{1.73_m2} — ABNORMAL LOW (ref >59)
Globulin, Total: 2 g/dL (ref 1.5–4.5)
Glucose: 121 mg/dL — ABNORMAL HIGH (ref 65–99)
Potassium: 4.5 mmol/L (ref 3.5–5.2)
Sodium: 140 mmol/L (ref 134–144)
Total Protein: 6.2 g/dL (ref 6.0–8.5)

## 2015-12-21 LAB — HEMOGLOBIN A1C
Est. average glucose Bld gHb Est-mCnc: 108 mg/dL
Hgb A1c MFr Bld: 5.4 % (ref 4.8–5.6)

## 2015-12-21 LAB — TSH: TSH: 1.9 u[IU]/mL (ref 0.450–4.500)

## 2015-12-27 DIAGNOSIS — F3341 Major depressive disorder, recurrent, in partial remission: Secondary | ICD-10-CM | POA: Diagnosis not present

## 2016-01-10 ENCOUNTER — Ambulatory Visit (INDEPENDENT_AMBULATORY_CARE_PROVIDER_SITE_OTHER): Payer: Medicare Other | Admitting: Pharmacotherapy

## 2016-01-10 ENCOUNTER — Encounter: Payer: Self-pay | Admitting: Pharmacotherapy

## 2016-01-10 VITALS — BP 128/70 | HR 61 | Temp 98.6°F | Resp 14 | Ht 64.0 in | Wt 189.0 lb

## 2016-01-10 DIAGNOSIS — I480 Paroxysmal atrial fibrillation: Secondary | ICD-10-CM

## 2016-01-10 DIAGNOSIS — I82409 Acute embolism and thrombosis of unspecified deep veins of unspecified lower extremity: Secondary | ICD-10-CM

## 2016-01-10 LAB — POCT INR: INR: 2

## 2016-01-10 NOTE — Patient Instructions (Signed)
INR 2.0 Continue Coumadin 5mg  daily except 1/2 tablet on Fridays

## 2016-01-10 NOTE — Progress Notes (Signed)
   Subjective:    Patient ID: Andrea [name], female    DOB: 12-17-1934, 81 y.o.   MRN: [id number]  HPI Last INR was OK and Coumadin was continued at 5mg  QD except 2.5mg  Fridays Denies missed doses. Denies unusual bleeding or bruising. Denies CP, palpitations, falls Consistent with vitamin K intake.   Review of Systems  HENT: Negative for nosebleeds.   Respiratory: Negative for shortness of breath.   Cardiovascular: Negative for chest pain and palpitations.  Gastrointestinal: Negative for blood in stool and anal bleeding.  Genitourinary: Negative for hematuria.  Hematological: Does not bruise/bleed easily.       Objective:   Physical Exam  Constitutional: She is oriented to person, place, and time. She appears well-developed and well-nourished.  HENT:  Right Ear: External ear normal.  Left Ear: External ear normal.  Cardiovascular: Normal rate, regular rhythm and normal heart sounds.   Pulmonary/Chest: Effort normal and breath sounds normal.  Neurological: She is alert and oriented to person, place, and time.  Skin: Skin is warm and dry.  Psychiatric: She has a normal mood and affect. Her behavior is normal. Judgment and thought content normal.  Vitals reviewed.    BP: 128/70,  HR: 61  Wt: 189lb INR 2.0     Assessment & Plan:  1.  INR at goal 2-3 2.  Continue Coumadin 5mg  qD except 2.5mg  Fridays 3.  RTC in 1 month

## 2016-01-20 ENCOUNTER — Encounter: Payer: Self-pay | Admitting: Internal Medicine

## 2016-01-20 ENCOUNTER — Ambulatory Visit (INDEPENDENT_AMBULATORY_CARE_PROVIDER_SITE_OTHER): Payer: Medicare Other | Admitting: Internal Medicine

## 2016-01-20 VITALS — BP 118/76 | HR 68 | Temp 98.6°F | Ht 64.0 in | Wt 187.0 lb

## 2016-01-20 DIAGNOSIS — M79674 Pain in right toe(s): Secondary | ICD-10-CM | POA: Diagnosis not present

## 2016-01-20 DIAGNOSIS — L570 Actinic keratosis: Secondary | ICD-10-CM

## 2016-01-20 MED ORDER — PREDNISONE 10 MG (21) PO TBPK: ORAL_TABLET | ORAL | Status: DC

## 2016-01-20 NOTE — Progress Notes (Signed)
Patient ID: Andrea [name], female   DOB: Jul 17, 1935, 81 y.o.   MRN: [id number]   Location:  [facility] Provider: Catlin Aycock L. Mariea Clonts, D.O., C.M.D.  Goals of Care:  Advanced Directives 01/20/2016  Does patient have an advance directive? No  Does patient want to make changes to advanced directive? -   Chief Complaint  Patient presents with  . Acute Visit    pain in both big toes, for 3 months, gotten worse lately. Doesn't hurt as much durning the day. At night she has to put a box over them to keep the sheet off of them.   . brown spots    raised on back, daughter wants checked.  Here with friend [name]    HPI: Patient is a 81 y.o. female seen today for an acute visit for possible gout in her big toes and second toe on right foot.  Not as bad in the daytime.  Excruciating at night.  Has to put something in the bed to keep the sheets off of it. Has never had gout before that she knows of.       Brown spots on her back.  They are raised and itchy.  Not painful.  Have been there for a long time, but her daughter was concerned b/c they were asymmetric and multiple colors.  Past Medical History  Diagnosis Date  . Hypotension   . Depression with anxiety   . Cough     Wert-onset 08/2009, as of 2014- resolved   . Deep venous thrombosis ([place])     post op, rec'd/needed  blood thinner   . Cervicalgia   . Hypothyroidism   . Blood transfusion   . Constipation   . Shortness of breath   . Anxiety   . Dysrhythmia   . Anemia   . Depression   . Lumbar radiculopathy 09/26/2011  . Headache(784.0) 09/27/2011  . Pneumonia     never been in hosp. for pneumonia   . Sleep apnea     doesn't use CPAP any longer, since weight loss  . Arthritis     hands, spine   . History of stress test     15-20 yrs.ago   . Falls frequently     pt. reports that she was falling 3 times a day, has had PT at a facility for a while & now is getting home PT 2-3 times/ week   . Right hand fracture     Monday  . Prediabetes       Past Surgical History  Procedure Laterality Date  . Replacement total knee bilateral    . Appendectomy    . Breast excisional biopsy      breast bxs on left x 2, breast bx of right x 1-all benign  . Laparoscopic ovarian cystectomy      not done by laparscopy-abdominal incision  . Joint replacement  [id number]    bilateral  . Tonsillectomy    . Eye surgery      cataracts bilateral /w IOL  . Hernia repair      umbilical hernia- [id number]  . Back surgery      2000  . Anterior cervical decomp/discectomy fusion N/A 07/09/2013    Procedure: ANTERIOR CERVICAL DECOMPRESSION/DISCECTOMY FUSION 3 LEVELS;  Surgeon: Sinclair Ship, MD;  Location: [facility];  Service: Orthopedics;  Laterality: N/A;  Anterior cervical decompression fusion, cervical 3-4, cervical 4-5-, cervical 5-6 with instrumentation, allograft.    Allergies  Allergen Reactions  . Clindamycin Hcl Other (  See Comments)    REACTION: swelling, pt. Reports that she had a 16 lb. weightgain in one day      Medication List       This list is accurate as of: 01/20/16  3:51 PM.  Always use your most recent med list.               Brexpiprazole 0.5 MG Tabs  Commonly known as:  REXULTI  Take 1 tablet by mouth at bedtime.     busPIRone 15 MG tablet  Commonly known as:  BUSPAR  Take 15 mg by mouth daily.     CALCIUM CITRATE PO  Take 600 mg by mouth 2 (two) times daily.     clonazePAM 0.5 MG tablet  Commonly known as:  KLONOPIN  Take 1 tablet by mouth as needed in middle of the nite to go back to sleep     DULoxetine 60 MG capsule  Commonly known as:  CYMBALTA  Take 2 capsules (120 mg total) by mouth daily.     levothyroxine 50 MCG tablet  Commonly known as:  SYNTHROID, LEVOTHROID  TAKE 1 TABLET (50 MCG TOTAL) BY MOUTH EVERY MORNING.     meloxicam 15 MG tablet  Commonly known as:  MOBIC  TAKE 1 TABLET EVERY DAY     mirtazapine 15 MG tablet  Commonly known as:  REMERON  Take 7.5 mg by mouth at bedtime.      MULTIVITAMIN PO  Take 1 tablet by mouth.     MYRBETRIQ 25 MG Tb24 tablet  Generic drug:  mirabegron ER  Take 25 mg by mouth daily.     oxyCODONE-acetaminophen 10-325 MG tablet  Commonly known as:  PERCOCET  Take 1-2 tablets by mouth every 4-6 hours as needed for Arthritis pain     RESTASIS 0.05 % ophthalmic emulsion  Generic drug:  cycloSPORINE  PLACE 1 DROP INTO BOTH EYES TWICE A DAY     traZODone 100 MG tablet  Commonly known as:  DESYREL  Take 2 tablets by mouth at bedtime     warfarin 5 MG tablet  Commonly known as:  COUMADIN  Take 1 tablet daily except 1/2 tablet on  Fridays        Review of Systems:  Review of Systems  Constitutional: Positive for malaise/fatigue. Negative for fever and chills.  Respiratory: Negative for shortness of breath.   Cardiovascular: Negative for chest pain and leg swelling.  Musculoskeletal: Positive for falls.       Bilateral great toes red, had been swollen, are very tender and right second toe also  Neurological: Negative for weakness.    Health Maintenance  Topic Date Due  . FOOT EXAM  06/16/1947  . OPHTHALMOLOGY EXAM  06/16/1947  . URINE MICROALBUMIN  06/16/1947  . ZOSTAVAX  06/15/1997  . TETANUS/TDAP  11/21/2015  . HEMOGLOBIN A1C  06/18/2016  . INFLUENZA VACCINE  06/20/2016  . DEXA SCAN  Completed  . PNA vac Low Risk Adult  Completed    Physical Exam: Filed Vitals:   01/20/16 1541  BP: 118/76  Pulse: 68  Temp: 98.6 F (37 C)  TempSrc: Oral  Height: 5\' 4"  (1.626 m)  Weight: 187 lb (84.823 kg)  SpO2: 98%   Body mass index is 32.08 kg/(m^2). Physical Exam  Constitutional: She is oriented to person, place, and time. She appears well-developed and well-nourished. No distress.  Cardiovascular:  irreg irreg  Pulmonary/Chest: Effort normal and breath sounds normal. No respiratory distress.  Musculoskeletal:  Normal range of motion.  Walking with walker, seems unsteadier than usual today  Neurological: She is alert and  oriented to person, place, and time.  Skin:  Bilateral great toes and right second toe erythematous, mildly warm, mildly tender, no necrosis or gangrene, DP pulses intact, but diminished, no visible edema of toes at present  Psychiatric: She has a normal mood and affect.    Labs reviewed: Basic Metabolic Panel:  Recent Labs  04/21/15 0819 08/09/15 1027 09/15/15 0816 12/20/15 1357  NA 141 142 141 140  K 4.5 4.4 4.3 4.5  CL 102 99 100 98  CO2 25 28 27 27   GLUCOSE 99 91 89 121*  BUN 26 31* 22 22  CREATININE 0.92 0.98 0.91 1.02*  CALCIUM 9.3 9.5 9.2 9.2  TSH 3.650  --   --  1.900   Liver Function Tests:  Recent Labs  08/09/15 1027 09/15/15 0816 12/20/15 1357  AST 19 18 17   ALT 10 8 10   ALKPHOS 65 71 70  BILITOT 0.2 <0.2 0.2  PROT 6.3 5.9* 6.2  ALBUMIN 4.3 4.0 4.2   No results for input(s): LIPASE, AMYLASE in the last 8760 hours. No results for input(s): AMMONIA in the last 8760 hours. CBC:  Recent Labs  04/21/15 0828 08/09/15 1027 09/15/15 0816  WBC 5.3 6.3 5.1  NEUTROABS 3.3 4.4 2.4  HCT 39.4 41.0 40.2  MCV 88 90 89  PLT 223 228 199   Lipid Panel:  Recent Labs  09/15/15 0816  CHOL 169  HDL 46  LDLCALC 102*  TRIG 107  CHOLHDL 3.7   Lab Results  Component Value Date   HGBA1C 5.4 12/20/2015    Assessment/Plan 1. Great toe pain, right - seems most likely gout by history, location, but seems she should have more swelling and tenderness should be all day, not just at night - check Uric Acid - treat with predniSONE (STERAPRED UNI-PAK 21 TAB) 10 MG (21) TBPK tablet; Take as directed on package  Dispense: 21 tablet; Refill: 0 B/c I'm not sure that's what it is -if uric acid normal, would check arterial circulation--arterial dopplers with ABIs  2. Actinic keratosis -of back -would not send to derm for these unless they bother her--they are age spots from years of sun exposure   Labs/tests ordered:   Orders Placed This Encounter  Procedures  .  Uric Acid    Next appt:  02/07/2016  Alphonso Gregson L. Dwanda Tufano, D.O. [facility] [address], [place] [address] Cell Phone (Mon-Fri 8am-5pm):  [phone] On Call:  [phone] & follow prompts after 5pm & weekends Office Phone:  [phone] Office Fax:  [phone]

## 2016-01-21 LAB — URIC ACID: Uric Acid: 3.2 mg/dL (ref 2.5–7.1)

## 2016-01-24 DIAGNOSIS — N3281 Overactive bladder: Secondary | ICD-10-CM | POA: Diagnosis not present

## 2016-01-24 DIAGNOSIS — Z0000 Encounter for general adult medical examination without abnormal findings: Secondary | ICD-10-CM | POA: Diagnosis not present

## 2016-01-25 ENCOUNTER — Telehealth: Payer: Self-pay

## 2016-01-25 ENCOUNTER — Other Ambulatory Visit: Payer: Self-pay | Admitting: Internal Medicine

## 2016-01-25 DIAGNOSIS — M79676 Pain in unspecified toe(s): Secondary | ICD-10-CM

## 2016-01-25 NOTE — Telephone Encounter (Signed)
I put in the arterial dopplers with abis for after 02/03/16.

## 2016-01-25 NOTE — Telephone Encounter (Signed)
-----   Message from Gayland Curry, DO sent at 01/21/2016 11:23 AM EST ----- Uric acid level was normal.  Unclear what is causing her great toe and second toe pain.  Gout is very doubtful with this level.   I would like her to get arterial doppler studies done of her legs with ABIs to assess her arterial circulation.

## 2016-01-25 NOTE — Telephone Encounter (Signed)
Spoke with patient, discussed labs. Patient verbalized understanding of results and is in agreement to get recommended study. Please place order for study (I was unable to determine which doppler to select) Patient mentioned that she is leaving tomorrow to go out of town and will not return until 02/02/16. Test should be scheduled for 02/03/16 or after.

## 2016-02-07 ENCOUNTER — Encounter: Payer: Self-pay | Admitting: Pharmacotherapy

## 2016-02-07 ENCOUNTER — Ambulatory Visit (INDEPENDENT_AMBULATORY_CARE_PROVIDER_SITE_OTHER): Payer: Medicare Other | Admitting: Pharmacotherapy

## 2016-02-07 ENCOUNTER — Telehealth: Payer: Self-pay

## 2016-02-07 VITALS — BP 106/72 | HR 72 | Temp 98.8°F | Ht 64.0 in | Wt 186.0 lb

## 2016-02-07 DIAGNOSIS — I480 Paroxysmal atrial fibrillation: Secondary | ICD-10-CM | POA: Diagnosis not present

## 2016-02-07 DIAGNOSIS — I82409 Acute embolism and thrombosis of unspecified deep veins of unspecified lower extremity: Secondary | ICD-10-CM

## 2016-02-07 DIAGNOSIS — R2689 Other abnormalities of gait and mobility: Secondary | ICD-10-CM

## 2016-02-07 DIAGNOSIS — M6281 Muscle weakness (generalized): Secondary | ICD-10-CM

## 2016-02-07 LAB — POCT INR: INR: 1.2

## 2016-02-07 MED ORDER — WARFARIN SODIUM 5 MG PO TABS: ORAL_TABLET | ORAL | Status: DC
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
## 2016-02-07 NOTE — Patient Instructions (Signed)
INR 1.2 Take Coumadin 7.5mg  (1 & 1/2 tablets) for 2 days Then resume Coumadin 5mg  daily except 2.5mg  (1/2 tablet) on Fridays

## 2016-02-07 NOTE — Telephone Encounter (Signed)
Patient was in office to see Andrea [name] and requested message be sent to Dr.Reed for PT order request. Patient states she is having issues with balance, walking, and getting in and out of chairs. Patient had PT through [facility] before. Patient would like to have outpatient PT through [facility] Dr.Reed recommends.     Please advise, if ok please place order.

## 2016-02-07 NOTE — Addendum Note (Signed)
Addended by: Logan Bores on: 02/07/2016 03:12 PM   Modules accepted: Orders

## 2016-02-08 NOTE — Telephone Encounter (Signed)
Order has been placed for PT at [facility] if they accept outside referrals.

## 2016-02-10 ENCOUNTER — Ambulatory Visit: Payer: Medicare Other | Admitting: Physical Therapy

## 2016-02-14 ENCOUNTER — Encounter: Payer: Self-pay | Admitting: Pharmacotherapy

## 2016-02-16 DIAGNOSIS — M6281 Muscle weakness (generalized): Secondary | ICD-10-CM | POA: Diagnosis not present

## 2016-02-16 DIAGNOSIS — Z7409 Other reduced mobility: Secondary | ICD-10-CM | POA: Diagnosis not present

## 2016-02-20 ENCOUNTER — Other Ambulatory Visit: Payer: Self-pay | Admitting: Internal Medicine

## 2016-02-22 DIAGNOSIS — Z7409 Other reduced mobility: Secondary | ICD-10-CM | POA: Diagnosis not present

## 2016-02-22 DIAGNOSIS — M6281 Muscle weakness (generalized): Secondary | ICD-10-CM | POA: Diagnosis not present

## 2016-02-24 DIAGNOSIS — M6281 Muscle weakness (generalized): Secondary | ICD-10-CM | POA: Diagnosis not present

## 2016-02-24 DIAGNOSIS — Z7409 Other reduced mobility: Secondary | ICD-10-CM | POA: Diagnosis not present

## 2016-02-28 DIAGNOSIS — Z7409 Other reduced mobility: Secondary | ICD-10-CM | POA: Diagnosis not present

## 2016-02-28 DIAGNOSIS — M6281 Muscle weakness (generalized): Secondary | ICD-10-CM | POA: Diagnosis not present

## 2016-03-02 DIAGNOSIS — Z7409 Other reduced mobility: Secondary | ICD-10-CM | POA: Diagnosis not present

## 2016-03-02 DIAGNOSIS — M6281 Muscle weakness (generalized): Secondary | ICD-10-CM | POA: Diagnosis not present

## 2016-03-06 ENCOUNTER — Ambulatory Visit: Payer: Medicare Other | Admitting: Pharmacotherapy

## 2016-03-06 DIAGNOSIS — M6281 Muscle weakness (generalized): Secondary | ICD-10-CM | POA: Diagnosis not present

## 2016-03-06 DIAGNOSIS — Z7409 Other reduced mobility: Secondary | ICD-10-CM | POA: Diagnosis not present

## 2016-03-09 DIAGNOSIS — Z7409 Other reduced mobility: Secondary | ICD-10-CM | POA: Diagnosis not present

## 2016-03-09 DIAGNOSIS — M6281 Muscle weakness (generalized): Secondary | ICD-10-CM | POA: Diagnosis not present

## 2016-03-13 ENCOUNTER — Encounter: Payer: Self-pay | Admitting: Pharmacotherapy

## 2016-03-13 ENCOUNTER — Ambulatory Visit (INDEPENDENT_AMBULATORY_CARE_PROVIDER_SITE_OTHER): Payer: Medicare Other | Admitting: Pharmacotherapy

## 2016-03-13 VITALS — BP 132/80 | HR 67 | Temp 98.5°F | Ht 64.0 in | Wt 186.0 lb

## 2016-03-13 DIAGNOSIS — I480 Paroxysmal atrial fibrillation: Secondary | ICD-10-CM | POA: Diagnosis not present

## 2016-03-13 DIAGNOSIS — Z7409 Other reduced mobility: Secondary | ICD-10-CM | POA: Diagnosis not present

## 2016-03-13 DIAGNOSIS — G894 Chronic pain syndrome: Secondary | ICD-10-CM

## 2016-03-13 DIAGNOSIS — M6281 Muscle weakness (generalized): Secondary | ICD-10-CM | POA: Diagnosis not present

## 2016-03-13 DIAGNOSIS — I82409 Acute embolism and thrombosis of unspecified deep veins of unspecified lower extremity: Secondary | ICD-10-CM

## 2016-03-13 LAB — POCT INR: INR: 2.3

## 2016-03-13 MED ORDER — OXYCODONE-ACETAMINOPHEN 10-325 MG PO TABS: ORAL_TABLET | ORAL | Status: DC

## 2016-03-13 NOTE — Patient Instructions (Signed)
INR 2.3 Continue Coumadin 5mg  daily except 2.5mg  (1/2 tablet) on Fridays

## 2016-03-13 NOTE — Progress Notes (Signed)
   Subjective:    Patient ID: Andrea [name], female    DOB: 1935-01-06, 81 y.o.   MRN: [id number]  HPI  Last INR on 02/07/16 was low at 1.2 She was given 2 booster doses, then resumed Coumadin 5mg  QD except 2.5mg  Fridays. Denies missed doses. Denies unusual bleeding or bruising. Denies CP, SOB, palpitations, falls Consistent with vitamin K intake.   Review of Systems  HENT: Negative for nosebleeds.   Respiratory: Negative for shortness of breath.   Cardiovascular: Negative for chest pain and palpitations.  Gastrointestinal: Negative for blood in stool and anal bleeding.  Genitourinary: Negative for hematuria.  Hematological: Does not bruise/bleed easily.       Objective:   Physical Exam  Constitutional: She is oriented to person, place, and time. She appears well-developed and well-nourished.  HENT:  Right Ear: External ear normal.  Left Ear: External ear normal.  Cardiovascular: Normal rate, regular rhythm and normal heart sounds.   Pulmonary/Chest: Effort normal and breath sounds normal.  Neurological: She is alert and oriented to person, place, and time.  Skin: Skin is warm and dry.  Psychiatric: She has a normal mood and affect. Her behavior is normal. Judgment and thought content normal.  Vitals reviewed.   BP: 132/80  HR: 67  Wt: 186lb INR 2.3      Assessment & Plan:  1.  INR at goal 2-3 2.  Continue Coumadin 5mg  QD except 2.5mg  Fridays. 3.  INR 1 month

## 2016-03-16 DIAGNOSIS — M6281 Muscle weakness (generalized): Secondary | ICD-10-CM | POA: Diagnosis not present

## 2016-03-16 DIAGNOSIS — Z7409 Other reduced mobility: Secondary | ICD-10-CM | POA: Diagnosis not present

## 2016-03-21 DIAGNOSIS — M6281 Muscle weakness (generalized): Secondary | ICD-10-CM | POA: Diagnosis not present

## 2016-03-21 DIAGNOSIS — Z7409 Other reduced mobility: Secondary | ICD-10-CM | POA: Diagnosis not present

## 2016-03-23 DIAGNOSIS — M6281 Muscle weakness (generalized): Secondary | ICD-10-CM | POA: Diagnosis not present

## 2016-03-23 DIAGNOSIS — Z7409 Other reduced mobility: Secondary | ICD-10-CM | POA: Diagnosis not present

## 2016-03-27 DIAGNOSIS — Z7409 Other reduced mobility: Secondary | ICD-10-CM | POA: Diagnosis not present

## 2016-03-27 DIAGNOSIS — M6281 Muscle weakness (generalized): Secondary | ICD-10-CM | POA: Diagnosis not present

## 2016-03-30 DIAGNOSIS — Z7409 Other reduced mobility: Secondary | ICD-10-CM | POA: Diagnosis not present

## 2016-03-30 DIAGNOSIS — M6281 Muscle weakness (generalized): Secondary | ICD-10-CM | POA: Diagnosis not present

## 2016-04-04 DIAGNOSIS — Z7409 Other reduced mobility: Secondary | ICD-10-CM | POA: Diagnosis not present

## 2016-04-04 DIAGNOSIS — M6281 Muscle weakness (generalized): Secondary | ICD-10-CM | POA: Diagnosis not present

## 2016-04-06 DIAGNOSIS — Z7409 Other reduced mobility: Secondary | ICD-10-CM | POA: Diagnosis not present

## 2016-04-06 DIAGNOSIS — M6281 Muscle weakness (generalized): Secondary | ICD-10-CM | POA: Diagnosis not present

## 2016-04-10 DIAGNOSIS — Z7409 Other reduced mobility: Secondary | ICD-10-CM | POA: Diagnosis not present

## 2016-04-10 DIAGNOSIS — M6281 Muscle weakness (generalized): Secondary | ICD-10-CM | POA: Diagnosis not present

## 2016-04-13 DIAGNOSIS — Z7409 Other reduced mobility: Secondary | ICD-10-CM | POA: Diagnosis not present

## 2016-04-13 DIAGNOSIS — M6281 Muscle weakness (generalized): Secondary | ICD-10-CM | POA: Diagnosis not present

## 2016-04-20 ENCOUNTER — Ambulatory Visit (INDEPENDENT_AMBULATORY_CARE_PROVIDER_SITE_OTHER): Payer: Medicare Other | Admitting: Internal Medicine

## 2016-04-20 ENCOUNTER — Encounter: Payer: Self-pay | Admitting: Internal Medicine

## 2016-04-20 VITALS — BP 124/78 | HR 70 | Temp 98.7°F | Resp 17 | Ht 64.0 in | Wt 181.4 lb

## 2016-04-20 DIAGNOSIS — I480 Paroxysmal atrial fibrillation: Secondary | ICD-10-CM | POA: Diagnosis not present

## 2016-04-20 DIAGNOSIS — G4700 Insomnia, unspecified: Secondary | ICD-10-CM

## 2016-04-20 DIAGNOSIS — F329 Major depressive disorder, single episode, unspecified: Secondary | ICD-10-CM

## 2016-04-20 DIAGNOSIS — M899 Disorder of bone, unspecified: Secondary | ICD-10-CM | POA: Diagnosis not present

## 2016-04-20 DIAGNOSIS — M8580 Other specified disorders of bone density and structure, unspecified site: Secondary | ICD-10-CM | POA: Insufficient documentation

## 2016-04-20 DIAGNOSIS — Z7189 Other specified counseling: Secondary | ICD-10-CM | POA: Insufficient documentation

## 2016-04-20 DIAGNOSIS — F32A Depression, unspecified: Secondary | ICD-10-CM | POA: Insufficient documentation

## 2016-04-20 DIAGNOSIS — E785 Hyperlipidemia, unspecified: Secondary | ICD-10-CM | POA: Diagnosis not present

## 2016-04-20 DIAGNOSIS — E669 Obesity, unspecified: Secondary | ICD-10-CM

## 2016-04-20 DIAGNOSIS — G894 Chronic pain syndrome: Secondary | ICD-10-CM | POA: Diagnosis not present

## 2016-04-20 MED ORDER — OXYCODONE-ACETAMINOPHEN 10-325 MG PO TABS: ORAL_TABLET | ORAL | Status: DC

## 2016-04-20 NOTE — Progress Notes (Signed)
Location:  [facility] clinic Provider:  Wilhelm Ganaway L. Mariea Clonts, D.O., C.M.D.  Code Status: requested to discuss DNR today--would like goldenrod completed--is going to do her HCPOA, living will  Goals of Care:  Advanced Directives 04/20/2016  Does patient have an advance directive? No  Does patient want to make changes to advanced directive? -  Would patient like information on creating an advanced directive? Yes - Geneticist, molecular Complaint  Patient presents with  . Medical Management of Chronic Issues    4 month follow-up  . OTHER    Desmond Dike, in room  . OTHER    Discuss DNR     HPI: Patient is a 81 y.o. female seen today for medical management of chronic diseases.    Is down 5 lbs.  Having increased back pain arthritis.  PT did wonders.  Can get up out of a chair, turn over and put her own socks on.  Was worthwhile.    Finally without low grade temp.  Still with some sinus congestion.  Senile osteopenia:  Takes 2 capsules 500 IU vitamin D.  Has not had a level.     Has had a few down days but feeling ok now.  Continues on her cymbalta and clonazepam.  Also on trazodone and rexulti.    Sleeping well at night.  No increase in edema or shortness of breath.  Knows when she's having an afib spell. On coumadin and last INR 2.3.  Was low the month before. No bleeding.   Past Medical History  Diagnosis Date  . Hypotension   . Depression with anxiety   . Cough     Wert-onset 08/2009, as of 2014- resolved   . Deep venous thrombosis ([place])     post op, rec'd/needed  blood thinner   . Cervicalgia   . Hypothyroidism   . Blood transfusion   . Constipation   . Shortness of breath   . Anxiety   . Dysrhythmia   . Anemia   . Depression   . Lumbar radiculopathy 09/26/2011  . Headache(784.0) 09/27/2011  . Pneumonia     never been in hosp. for pneumonia   . Sleep apnea     doesn't use CPAP any longer, since weight loss  . Arthritis     hands, spine   . History  of stress test     15-20 yrs.ago   . Falls frequently     pt. reports that she was falling 3 times a day, has had PT at a facility for a while & now is getting home PT 2-3 times/ week   . Right hand fracture     Monday  . Prediabetes     Past Surgical History  Procedure Laterality Date  . Replacement total knee bilateral    . Appendectomy    . Breast excisional biopsy      breast bxs on left x 2, breast bx of right x 1-all benign  . Laparoscopic ovarian cystectomy      not done by laparscopy-abdominal incision  . Joint replacement  [id number]    bilateral  . Tonsillectomy    . Eye surgery      cataracts bilateral /w IOL  . Hernia repair      umbilical hernia- [id number]  . Back surgery      2000  . Anterior cervical decomp/discectomy fusion N/A 07/09/2013    Procedure: ANTERIOR CERVICAL DECOMPRESSION/DISCECTOMY FUSION 3 LEVELS;  Surgeon: Sinclair Ship, MD;  Location: [facility];  Service: Orthopedics;  Laterality: N/A;  Anterior cervical decompression fusion, cervical 3-4, cervical 4-5-, cervical 5-6 with instrumentation, allograft.    Allergies  Allergen Reactions  . Clindamycin Hcl Other (See Comments)    REACTION: swelling, pt. Reports that she had a 16 lb. weightgain in one day      Medication List       This list is accurate as of: 04/20/16 11:34 AM.  Always use your most recent med list.               Brexpiprazole 0.5 MG Tabs  Commonly known as:  REXULTI  Take 1 tablet by mouth at bedtime.     busPIRone 15 MG tablet  Commonly known as:  BUSPAR  Take 15 mg by mouth daily.     CALCIUM CITRATE PO  Take 600 mg by mouth 2 (two) times daily.     clonazePAM 0.5 MG tablet  Commonly known as:  KLONOPIN  Take 1 tablet by mouth as needed in middle of the nite to go back to sleep     DULoxetine 60 MG capsule  Commonly known as:  CYMBALTA  Take 2 capsules (120 mg total) by mouth daily.     furosemide 40 MG tablet  Commonly known as:  LASIX  1/2 by mouth daily       levothyroxine 50 MCG tablet  Commonly known as:  SYNTHROID, LEVOTHROID  TAKE 1 TABLET (50 MCG TOTAL) BY MOUTH EVERY MORNING.     meloxicam 15 MG tablet  Commonly known as:  MOBIC  TAKE 1 TABLET EVERY DAY     mirtazapine 15 MG tablet  Commonly known as:  REMERON  Take 7.5 mg by mouth at bedtime.     MULTIVITAMIN PO  Take 1 tablet by mouth.     MYRBETRIQ 25 MG Tb24 tablet  Generic drug:  mirabegron ER  Take 25 mg by mouth daily.     oxyCODONE-acetaminophen 10-325 MG tablet  Commonly known as:  PERCOCET  Take 1-2 tablets by mouth every 4-6 hours as needed for Arthritis pain     RESTASIS 0.05 % ophthalmic emulsion  Generic drug:  cycloSPORINE  PLACE 1 DROP INTO BOTH EYES TWICE A DAY     traZODone 100 MG tablet  Commonly known as:  DESYREL  Take 2 tablets by mouth at bedtime     warfarin 5 MG tablet  Commonly known as:  COUMADIN  Take 1 tablet daily except 1/2 tablet on  Fridays        Review of Systems:  Review of Systems  Constitutional: Negative for fever and chills.  HENT: Positive for hearing loss. Negative for congestion.   Eyes:       Glasses  Respiratory: Negative for cough and shortness of breath.   Cardiovascular: Positive for palpitations and leg swelling. Negative for chest pain, orthopnea and claudication.       Edema well-controlled  Gastrointestinal: Positive for constipation. Negative for abdominal pain, blood in stool and melena.       Well controlled  Genitourinary: Negative for dysuria.  Musculoskeletal: Negative for falls.  Skin: Negative for rash.  Neurological: Positive for sensory change. Negative for dizziness and loss of consciousness.  Psychiatric/Behavioral: Positive for depression and memory loss. The patient is nervous/anxious and has insomnia.        All well controlled    Health Maintenance  Topic Date Due  . FOOT EXAM  06/16/1947  . OPHTHALMOLOGY EXAM  06/16/1947  . URINE MICROALBUMIN  06/16/1947  . TETANUS/TDAP  11/21/2015   . ZOSTAVAX  05/18/2017 (Originally 06/15/1997)  . HEMOGLOBIN A1C  06/18/2016  . INFLUENZA VACCINE  06/20/2016  . DEXA SCAN  Completed  . PNA vac Low Risk Adult  Completed    Physical Exam: Filed Vitals:   04/20/16 1127  BP: 124/78  Pulse: 70  Temp: 98.7 F (37.1 C)  TempSrc: Oral  Resp: 17  Height: 5\' 4"  (1.626 m)  Weight: 181 lb 6.4 oz (82.283 kg)  SpO2: 93%   Body mass index is 31.12 kg/(m^2). Physical Exam  Labs reviewed: Basic Metabolic Panel:  Recent Labs  08/09/15 1027 09/15/15 0816 12/20/15 1357  NA 142 141 140  K 4.4 4.3 4.5  CL 99 100 98  CO2 28 27 27   GLUCOSE 91 89 121*  BUN 31* 22 22  CREATININE 0.98 0.91 1.02*  CALCIUM 9.5 9.2 9.2  TSH  --   --  1.900   Liver Function Tests:  Recent Labs  08/09/15 1027 09/15/15 0816 12/20/15 1357  AST 19 18 17   ALT 10 8 10   ALKPHOS 65 71 70  BILITOT 0.2 <0.2 0.2  PROT 6.3 5.9* 6.2  ALBUMIN 4.3 4.0 4.2   No results for input(s): LIPASE, AMYLASE in the last 8760 hours. No results for input(s): AMMONIA in the last 8760 hours. CBC:  Recent Labs  08/09/15 1027 09/15/15 0816  WBC 6.3 5.1  NEUTROABS 4.4 2.4  HCT 41.0 40.2  MCV 90 89  PLT 228 199   Lipid Panel:  Recent Labs  09/15/15 0816  CHOL 169  HDL 46  LDLCALC 102*  TRIG 107  CHOLHDL 3.7   Lab Results  Component Value Date   HGBA1C 5.4 12/20/2015   Assessment/Plan 1. Chronic pain syndrome -just had some therapy which was quite helpful for her -doing better - cont chronic oxyCODONE-acetaminophen (PERCOCET) 10-325 MG tablet; Take 1-2 tablets by mouth every 4-6 hours as needed for Arthritis pain  Dispense: 180 tablet; Refill: 0  2. Depression - controlled with current regimen, better again since routine back to normal  3. Senile osteopenia - on only vitamin D 1000 units daily  - last bone density was 2015 showing osteopenia, eats ice cream and tries to do some weightbearing exercise - Vitamin D, 25-hydroxy; Future -will plan to  order next bone density at next appt  4. Paroxysmal a-fib (HCC) -feels brief spurts of this occasionally -on chronic coumadin with last INR therapeutic and next coming up in 4 days  5. Insomnia -she is sleeping well with her med regimen through psychiatry  6. Hyperlipidemia - f/u labs monday - Lipid panel; Future - Hemoglobin A1c; Future - Basic metabolic panel; Future   7. Obesity - has lost 5 lbs--eating healthier but does love ice cream - Lipid panel; Future - Hemoglobin A1c; Future  8. Advanced care planning/counseling discussion -about 20 mins spent on goals of care, completed DNR and plans to do her living will and hcpoa and get notarized (previously had a problem when attempted at the bank)  Labs/tests ordered:   Orders Placed This Encounter  Procedures  . Lipid panel    Standing Status: Future     Number of Occurrences:      Standing Expiration Date: 07/21/2016    Order Specific Question:  Has the patient fasted?    Answer:  Yes  . Hemoglobin A1c    Standing Status: Future     Number of Occurrences:  Standing Expiration Date: 07/21/2016  . Basic metabolic panel    Standing Status: Future     Number of Occurrences:      Standing Expiration Date: 07/21/2016    Order Specific Question:  Has the patient fasted?    Answer:  Yes  . Vitamin D, 25-hydroxy    Standing Status: Future     Number of Occurrences:      Standing Expiration Date: 07/21/2016  . DNR (Do Not Resuscitate)    Discussed at appt 04/20/16.  Pt reports she [place] a reservation [place] and is ready to go when it's time.  Does not want resuscitation.  Is going to complete a living will and hcpoa also.  Goldenrod completed and given to her.    Order Specific Question:  In the event of cardiac or respiratory ARREST    Answer:  Do not call a "code blue"    Order Specific Question:  In the event of cardiac or respiratory ARREST    Answer:  Do not perform Intubation, CPR, defibrillation or ACLS    Order  Specific Question:  In the event of cardiac or respiratory ARREST    Answer:  Use medication by any route, position, wound care, and other measures to relive pain and suffering. May use oxygen, suction and manual treatment of airway obstruction as needed for comfort.  will need bone density ordered next time  Next appt:  04/24/2016 with [name] and for labs, then 4 mos with me   Idaly Verret L. Tamberlyn Midgley, D.O. [facility] [address], [place] [address] Cell Phone (Mon-Fri 8am-5pm):  [phone] On Call:  [phone] & follow prompts after 5pm & weekends Office Phone:  [phone] Office Fax:  [phone]

## 2016-04-24 ENCOUNTER — Encounter: Payer: Self-pay | Admitting: Pharmacotherapy

## 2016-04-24 ENCOUNTER — Other Ambulatory Visit: Payer: Medicare Other

## 2016-04-24 ENCOUNTER — Ambulatory Visit (INDEPENDENT_AMBULATORY_CARE_PROVIDER_SITE_OTHER): Payer: Medicare Other | Admitting: Pharmacotherapy

## 2016-04-24 VITALS — BP 108/66 | HR 69 | Temp 98.8°F | Ht 64.0 in | Wt 183.0 lb

## 2016-04-24 DIAGNOSIS — I82409 Acute embolism and thrombosis of unspecified deep veins of unspecified lower extremity: Secondary | ICD-10-CM | POA: Diagnosis not present

## 2016-04-24 DIAGNOSIS — E785 Hyperlipidemia, unspecified: Secondary | ICD-10-CM

## 2016-04-24 DIAGNOSIS — E669 Obesity, unspecified: Secondary | ICD-10-CM | POA: Diagnosis not present

## 2016-04-24 DIAGNOSIS — R739 Hyperglycemia, unspecified: Secondary | ICD-10-CM | POA: Diagnosis not present

## 2016-04-24 DIAGNOSIS — M8580 Other specified disorders of bone density and structure, unspecified site: Secondary | ICD-10-CM

## 2016-04-24 DIAGNOSIS — M899 Disorder of bone, unspecified: Secondary | ICD-10-CM | POA: Diagnosis not present

## 2016-04-24 DIAGNOSIS — I480 Paroxysmal atrial fibrillation: Secondary | ICD-10-CM

## 2016-04-24 LAB — POCT INR: INR: 2.1

## 2016-04-24 NOTE — Progress Notes (Signed)
   Subjective:    Patient ID: Andrea [name], female    DOB: 1935-11-05, 81 y.o.   MRN: [id number]  HPI Last INR on 03/13/16 was OK at 2.4 Coumadin dose is 5mg  QD except 2.5mg  on Fridays. Denies missed doses. Denies unusual bleeding or bruising. Denies CP, palpitations, falls, SOB Consistent with vitamin K intake   Review of Systems  HENT: Negative for nosebleeds.   Respiratory: Negative for shortness of breath.   Cardiovascular: Negative for chest pain and palpitations.  Genitourinary: Negative for hematuria.  Hematological: Does not bruise/bleed easily.       Objective:   Physical Exam  Constitutional: She is oriented to person, place, and time. She appears well-developed and well-nourished.  HENT:  Right Ear: External ear normal.  Left Ear: External ear normal.  Cardiovascular: Normal rate, regular rhythm and normal heart sounds.   Pulmonary/Chest: Effort normal and breath sounds normal.  Neurological: She is alert and oriented to person, place, and time.  Skin: Skin is warm and dry.  Psychiatric: She has a normal mood and affect. Her behavior is normal. Judgment and thought content normal.  Vitals reviewed.    BP: 108/66  HR: 69  Wt: 183lb INR 2.1     Assessment & Plan:  1.  INR at goal 2-3 2.  Continue Coumadin 5mg  QD except 2.5mg  on Fridays 3.  RTC in 1 month

## 2016-04-24 NOTE — Patient Instructions (Signed)
INR 2.1 Continue Coumadin 5mg  daily except 2.5mg  (1/2 tablet) on Fridays

## 2016-04-25 LAB — BASIC METABOLIC PANEL
BUN/Creatinine Ratio: 25 (ref 12–28)
BUN: 20 mg/dL (ref 8–27)
CO2: 24 mmol/L (ref 18–29)
Calcium: 9.6 mg/dL (ref 8.7–10.3)
Chloride: 101 mmol/L (ref 96–106)
Creatinine, Ser: 0.8 mg/dL (ref 0.57–1.00)
GFR calc Af Amer: 82 mL/min/{1.73_m2} (ref >59)
GFR calc non Af Amer: 71 mL/min/{1.73_m2} (ref >59)
Glucose: 99 mg/dL (ref 65–99)
Potassium: 4.2 mmol/L (ref 3.5–5.2)
Sodium: 144 mmol/L (ref 134–144)

## 2016-04-25 LAB — LIPID PANEL
Chol/HDL Ratio: 2.9 ratio units (ref 0.0–4.4)
Cholesterol, Total: 171 mg/dL (ref 100–199)
HDL: 58 mg/dL (ref >39)
LDL Calculated: 90 mg/dL (ref 0–99)
Triglycerides: 117 mg/dL (ref 0–149)
VLDL Cholesterol Cal: 23 mg/dL (ref 5–40)

## 2016-04-25 LAB — HEMOGLOBIN A1C
Est. average glucose Bld gHb Est-mCnc: 114 mg/dL
Hgb A1c MFr Bld: 5.6 % (ref 4.8–5.6)

## 2016-04-25 LAB — VITAMIN D 25 HYDROXY (VIT D DEFICIENCY, FRACTURES): Vit D, 25-Hydroxy: 76.4 ng/mL (ref 30.0–100.0)

## 2016-04-27 ENCOUNTER — Encounter: Payer: Self-pay | Admitting: *Deleted

## 2016-05-04 DIAGNOSIS — L84 Corns and callosities: Secondary | ICD-10-CM | POA: Diagnosis not present

## 2016-05-04 DIAGNOSIS — L603 Nail dystrophy: Secondary | ICD-10-CM | POA: Diagnosis not present

## 2016-05-04 DIAGNOSIS — I739 Peripheral vascular disease, unspecified: Secondary | ICD-10-CM | POA: Diagnosis not present

## 2016-05-08 ENCOUNTER — Other Ambulatory Visit: Payer: Self-pay | Admitting: Internal Medicine

## 2016-05-19 ENCOUNTER — Other Ambulatory Visit: Payer: Self-pay | Admitting: Internal Medicine

## 2016-06-05 ENCOUNTER — Encounter: Payer: Self-pay | Admitting: Pharmacotherapy

## 2016-06-05 ENCOUNTER — Ambulatory Visit (INDEPENDENT_AMBULATORY_CARE_PROVIDER_SITE_OTHER): Payer: Medicare Other | Admitting: Pharmacotherapy

## 2016-06-05 VITALS — BP 112/70 | HR 70 | Temp 98.7°F | Ht 61.0 in | Wt 182.6 lb

## 2016-06-05 DIAGNOSIS — I480 Paroxysmal atrial fibrillation: Secondary | ICD-10-CM

## 2016-06-05 DIAGNOSIS — I82409 Acute embolism and thrombosis of unspecified deep veins of unspecified lower extremity: Secondary | ICD-10-CM | POA: Diagnosis not present

## 2016-06-05 LAB — POCT INR: INR: 2

## 2016-06-05 NOTE — Progress Notes (Signed)
   Subjective:    Patient ID: Andrea [name], female    DOB: [birth year], 81 y.o.   MRN: [id number]  HPI Last INR was OK on Coumadin 5mg  QD except 2.5mg  on Fridays Did miss one dose. Denies unusual bleeding or bruising Denies CP, palpitations, falls Consistent with vitamin K intake. Has started taking Safe-Slim at daughter's request.   Review of Systems  HENT: Negative for nosebleeds.   Cardiovascular: Negative for chest pain and palpitations.  Gastrointestinal: Negative for blood in stool and anal bleeding.  Genitourinary: Negative for hematuria.  Hematological: Does not bruise/bleed easily.       Objective:   Physical Exam  Constitutional: She is oriented to person, place, and time. She appears well-developed and well-nourished.  HENT:  Right Ear: External ear normal.  Left Ear: External ear normal.  Cardiovascular: An irregular rhythm present.  Pulmonary/Chest: Effort normal and breath sounds normal.  Neurological: She is alert and oriented to person, place, and time.  Skin: Skin is warm and dry.  Psychiatric: She has a normal mood and affect. Her behavior is normal. Judgment and thought content normal.  Vitals reviewed.    BP: 112/70  HR: 70  Wt: 182lb INR 2.0     Assessment & Plan:  1.  INR at goal 2-3. 2.  Continue Coumadin 5mg  QD except 2.5mg  Fridays 3.  Advised her that taking OTC diet pills is not a good idea and that she does not need it. 4.  RTC 1 month

## 2016-06-05 NOTE — Patient Instructions (Addendum)
INR 2.0 Continue Coumadin 5mg  (1 tablet) daily except 2.5mg  (1/2 tablet) on Fridays

## 2016-06-28 ENCOUNTER — Other Ambulatory Visit: Payer: Self-pay | Admitting: Internal Medicine

## 2016-07-03 ENCOUNTER — Other Ambulatory Visit: Payer: Self-pay | Admitting: *Deleted

## 2016-07-03 DIAGNOSIS — I82409 Acute embolism and thrombosis of unspecified deep veins of unspecified lower extremity: Secondary | ICD-10-CM

## 2016-07-03 MED ORDER — WARFARIN SODIUM 5 MG PO TABS: ORAL_TABLET | ORAL | 3 refills | Status: DC

## 2016-07-03 NOTE — Telephone Encounter (Signed)
Robin with [facility] Requested refill for patient. Faxed.

## 2016-07-10 ENCOUNTER — Ambulatory Visit (INDEPENDENT_AMBULATORY_CARE_PROVIDER_SITE_OTHER): Payer: Medicare Other | Admitting: Pharmacotherapy

## 2016-07-10 ENCOUNTER — Encounter: Payer: Self-pay | Admitting: Pharmacotherapy

## 2016-07-10 ENCOUNTER — Other Ambulatory Visit: Payer: Self-pay

## 2016-07-10 VITALS — BP 108/72 | HR 64 | Resp 14 | Ht 61.0 in | Wt 187.0 lb

## 2016-07-10 DIAGNOSIS — I82409 Acute embolism and thrombosis of unspecified deep veins of unspecified lower extremity: Secondary | ICD-10-CM | POA: Diagnosis not present

## 2016-07-10 DIAGNOSIS — Z23 Encounter for immunization: Secondary | ICD-10-CM

## 2016-07-10 DIAGNOSIS — G894 Chronic pain syndrome: Secondary | ICD-10-CM

## 2016-07-10 DIAGNOSIS — I480 Paroxysmal atrial fibrillation: Secondary | ICD-10-CM

## 2016-07-10 LAB — POCT INR: INR: 1.4

## 2016-07-10 MED ORDER — OXYCODONE-ACETAMINOPHEN 10-325 MG PO TABS: ORAL_TABLET | ORAL | 0 refills | Status: DC

## 2016-07-10 MED ORDER — CLONAZEPAM 0.5 MG PO TABS: ORAL_TABLET | ORAL | 1 refills | Status: DC

## 2016-07-10 NOTE — Telephone Encounter (Signed)
Patient was in today to see Andrea [name] and requested a refill on medications

## 2016-07-10 NOTE — Patient Instructions (Signed)
INR 1.4 - low due to missed doses.  Continue Coumadin 5mg  (1 tablet) daily except 2.5mg  (1/2 tablet) on Fridays

## 2016-07-10 NOTE — Progress Notes (Signed)
   Subjective:    Patient ID: Andrea [name], female    DOB: 01-Nov-1935, 81 y.o.   MRN: [id number]  HPI Last INR on 06/05/16 was OK at 2.0. Current Coumadin dose is 5mg  QD except 2.5mg  on Fridays. She admits to missing 1-2 doses each week for the last 2 weeks.  She does use a pillbox reminder - she just forgot to take it. Denies unusual bleeding or bruising Denies CP, palpitations, SOB. Did have a fall - "Injured my pride". Consistent with vitamin K intake.   Review of Systems  HENT: Negative for nosebleeds.   Respiratory: Negative for shortness of breath.   Cardiovascular: Negative for chest pain and palpitations.  Gastrointestinal: Negative for anal bleeding and blood in stool.  Genitourinary: Negative for hematuria.  Hematological: Does not bruise/bleed easily.       Objective:   Physical Exam  Constitutional: She appears well-developed and well-nourished.  HENT:  Right Ear: External ear normal.  Left Ear: External ear normal.  Cardiovascular: Normal rate, regular rhythm and normal heart sounds.   Pulmonary/Chest: Effort normal and breath sounds normal.  Skin: Skin is warm and dry.  Psychiatric: She has a normal mood and affect. Her behavior is normal. Judgment and thought content normal.  Vitals reviewed.   BP: 108/72  HR: 64  Wt: 187lb INR 1.4     Assessment & Plan:  1.  INR below goal 2-3 due to missed doses. 2.  Counseled on importance of taking medications as prescribed. 3.  Continue Coumadin 5mg  QD except 2.5mg  Fridays 4.  RTC 1 month

## 2016-08-07 ENCOUNTER — Encounter: Payer: Self-pay | Admitting: Pharmacotherapy

## 2016-08-07 ENCOUNTER — Ambulatory Visit (INDEPENDENT_AMBULATORY_CARE_PROVIDER_SITE_OTHER): Payer: Medicare Other | Admitting: Pharmacotherapy

## 2016-08-07 VITALS — BP 116/70 | HR 64 | Resp 12 | Ht 61.0 in | Wt 188.0 lb

## 2016-08-07 DIAGNOSIS — E038 Other specified hypothyroidism: Secondary | ICD-10-CM | POA: Diagnosis not present

## 2016-08-07 DIAGNOSIS — I82409 Acute embolism and thrombosis of unspecified deep veins of unspecified lower extremity: Secondary | ICD-10-CM | POA: Diagnosis not present

## 2016-08-07 DIAGNOSIS — I480 Paroxysmal atrial fibrillation: Secondary | ICD-10-CM | POA: Diagnosis not present

## 2016-08-07 LAB — POCT INR: INR: 1.7

## 2016-08-07 MED ORDER — CLONAZEPAM 0.5 MG PO TABS: ORAL_TABLET | ORAL | 1 refills | Status: DC

## 2016-08-07 MED ORDER — LEVOTHYROXINE SODIUM 50 MCG PO TABS: ORAL_TABLET | ORAL | 3 refills | Status: DC

## 2016-08-07 NOTE — Progress Notes (Signed)
   Subjective:    Patient ID: Andrea [name], female    DOB: August 30, 1935, 81 y.o.   MRN: [id number]  HPI Last INR on 07/10/16 was low at 1.4 Coumadin was adjusted to 5mg  QD except 2.5mg  Fridays. Denies missed doses. Denies CP, palpitations, falls Denies unusual bleeding or bruising. Consistent with vitamin K.   Review of Systems  HENT: Negative for nosebleeds.   Cardiovascular: Negative for chest pain and palpitations.  Genitourinary: Negative for hematuria.  Hematological: Does not bruise/bleed easily.       Objective:   Physical Exam  Constitutional: She is oriented to person, place, and time. She appears well-developed and well-nourished.  HENT:  Right Ear: External ear normal.  Left Ear: External ear normal.  Cardiovascular: Normal rate, regular rhythm and normal heart sounds.   Pulmonary/Chest: Effort normal and breath sounds normal.  Neurological: She is alert and oriented to person, place, and time.  Skin: Skin is warm and dry.  Psychiatric: She has a normal mood and affect. Her behavior is normal. Judgment and thought content normal.  Vitals reviewed.   BP: 116/70  HR: 64  Wt: 188 INR  1.7      Assessment & Plan:  1.  INR below goal 2-3 2.  Increase Coumadin 5mg  QD 3.  RTC 2 week.

## 2016-08-07 NOTE — Patient Instructions (Signed)
INR 1.7 Increase Coumadin 5mg  daily

## 2016-08-14 DIAGNOSIS — Z79899 Other long term (current) drug therapy: Secondary | ICD-10-CM | POA: Diagnosis not present

## 2016-08-14 DIAGNOSIS — F332 Major depressive disorder, recurrent severe without psychotic features: Secondary | ICD-10-CM | POA: Diagnosis not present

## 2016-08-14 DIAGNOSIS — G894 Chronic pain syndrome: Secondary | ICD-10-CM | POA: Diagnosis not present

## 2016-08-14 LAB — BASIC METABOLIC PANEL
BUN: 28 mg/dL — AB (ref 4–21)
CREATININE: 0.9 mg/dL (ref 0.5–1.1)
GLUCOSE: 101 mg/dL
POTASSIUM: 4.5 mmol/L (ref 3.4–5.3)
Sodium: 139 mmol/L (ref 137–147)

## 2016-08-14 LAB — CBC AND DIFFERENTIAL
HCT: 38 % (ref 36–46)
Hemoglobin: 12.6 g/dL (ref 12.0–16.0)
Platelets: 194 10*3/uL (ref 150–399)
WBC: 6.1 10*3/mL

## 2016-08-14 LAB — TSH: TSH: 3.11 u[IU]/mL (ref 0.41–5.90)

## 2016-08-14 LAB — HEPATIC FUNCTION PANEL
ALT: 8 U/L (ref 7–35)
AST: 17 U/L (ref 13–35)
Alkaline Phosphatase: 52 U/L (ref 25–125)
Bilirubin, Total: 0.3 mg/dL

## 2016-08-14 LAB — POCT INR: INR: 1.5 — AB (ref 0.9–1.1)

## 2016-08-15 ENCOUNTER — Telehealth: Payer: Self-pay | Admitting: *Deleted

## 2016-08-15 NOTE — Telephone Encounter (Signed)
Called Valarie's office to make sure she didn't draw a TSH, before calling patient,  because she had ordered bloodwork also.  LMOM to return call.

## 2016-08-15 NOTE — Telephone Encounter (Signed)
Let's have her come in for a TSH b/c last one was in January.  [name] just saw her for her INR which shouldn't affect her mentation.

## 2016-08-15 NOTE — Telephone Encounter (Signed)
Ok.  Is her caregiver/friend aware of this situation?  Can we make sure?

## 2016-08-15 NOTE — Telephone Encounter (Signed)
Patient has an appointment with you on 08/21/2016

## 2016-08-15 NOTE — Telephone Encounter (Signed)
Deveron Furlong, NP with [facility] Faxed labs drawn on 08/14/2016:  PT: 15.4 INR: 1.5  TSH: 3.11  Vitamin D: 69  CBC: wbc 6.1           Hemoglobin 12.6           Hematocrit  38.0  CMP: sodium 139           Potassium 4.5           glucose 101           BUN 28           Creatinine 0.93           T.Bili 0.3           AST 17           ALT 8           Alkaline Phos 52 (Abstrated labs to patient's chart and placed in your review and sign folder.)

## 2016-08-15 NOTE — Telephone Encounter (Signed)
Called and [facility] for [name] [phone], Caregiver, to return call

## 2016-08-15 NOTE — Telephone Encounter (Signed)
Eulah Pont, NP called and stated that she saw patient and she was expressing suicidal indentation and worsening depression for the past 14 days. She rectified patient's medication along with ordering lab work. She wanted to give you heads up wondering if her TSH or Protime is off balance. You can reach her at [phone]

## 2016-08-21 ENCOUNTER — Encounter: Payer: Self-pay | Admitting: Internal Medicine

## 2016-08-21 ENCOUNTER — Ambulatory Visit (INDEPENDENT_AMBULATORY_CARE_PROVIDER_SITE_OTHER): Payer: Medicare Other | Admitting: Internal Medicine

## 2016-08-21 VITALS — BP 110/62 | HR 70 | Temp 98.8°F | Wt 188.0 lb

## 2016-08-21 DIAGNOSIS — G894 Chronic pain syndrome: Secondary | ICD-10-CM | POA: Diagnosis not present

## 2016-08-21 DIAGNOSIS — F3341 Major depressive disorder, recurrent, in partial remission: Secondary | ICD-10-CM

## 2016-08-21 DIAGNOSIS — E039 Hypothyroidism, unspecified: Secondary | ICD-10-CM

## 2016-08-21 DIAGNOSIS — I480 Paroxysmal atrial fibrillation: Secondary | ICD-10-CM | POA: Diagnosis not present

## 2016-08-21 DIAGNOSIS — Z7901 Long term (current) use of anticoagulants: Secondary | ICD-10-CM

## 2016-08-21 DIAGNOSIS — R200 Anesthesia of skin: Secondary | ICD-10-CM | POA: Diagnosis not present

## 2016-08-21 LAB — POCT INR: INR: 1.7

## 2016-08-21 MED ORDER — WARFARIN SODIUM 5 MG PO TABS: ORAL_TABLET | ORAL | 3 refills | Status: DC

## 2016-08-21 NOTE — Progress Notes (Signed)
Location:  [facility] clinic Provider:  Dequante Tremaine L. Mariea Clonts, D.O., C.M.D.  Code Status: DNR--has DNR form, but has not completed HCPOA and living will--paperwork given again with instruction to use [facility] hospital chaplaincy notary  Goals of Care:  Advanced Directives 08/21/2016  Does patient have an advance directive? No  Type of Advance Directive -  Does patient want to make changes to advanced directive? -  Copy of advanced directive(s) in chart? -  Would patient like information on creating an advanced directive? Yes - Educational materials given  Pre-existing out of facility DNR order (yellow form or pink MOST form) -     Chief Complaint  Patient presents with  . Medical Management of Chronic Issues    4 mth follow-up    HPI: Patient is a 81 y.o. female seen today for medical management of chronic diseases.    Had not been down like she was for 8 years.  Says she has no reason to be that way.  Her medications for depression were adjusted by psychiatry and she is starting to feel better.    INR was low.  She was taking an omega 3 supplement. She has quit the supplement.  Recheck today was still low at 1.7.  Has her next visit coming up with Tivis Ringer who adjusts her coumadin regularly.    Uses her pain pill each night for her back and sometimes during the day.    Toes are numb.  All of them.  Going on for probably a month.  No numbness/tingling down her legs.  They feel plastic when she wiggles them with her shoes off.    Past Medical History:  Diagnosis Date  . Anemia   . Anxiety   . Arthritis    hands, spine   . Blood transfusion   . Cervicalgia   . Constipation   . Cough    Wert-onset 08/2009, as of 2014- resolved   . Deep venous thrombosis ([place])    post op, rec'd/needed  blood thinner   . Depression   . Depression with anxiety   . Dysrhythmia   . Falls frequently    pt. reports that she was falling 3 times a day, has had PT at a facility for a while & now is getting  home PT 2-3 times/ week   . Headache(784.0) 09/27/2011  . History of stress test    15-20 yrs.ago   . Hypotension   . Hypothyroidism   . Lumbar radiculopathy 09/26/2011  . Pneumonia    never been in hosp. for pneumonia   . Prediabetes   . Right hand fracture    Monday  . Shortness of breath   . Sleep apnea    doesn't use CPAP any longer, since weight loss    Past Surgical History:  Procedure Laterality Date  . ANTERIOR CERVICAL DECOMP/DISCECTOMY FUSION N/A 07/09/2013   Procedure: ANTERIOR CERVICAL DECOMPRESSION/DISCECTOMY FUSION 3 LEVELS;  Surgeon: Sinclair Ship, MD;  Location: [place];  Service: Orthopedics;  Laterality: N/A;  Anterior cervical decompression fusion, cervical 3-4, cervical 4-5-, cervical 5-6 with instrumentation, allograft.  . APPENDECTOMY    . BACK SURGERY     2000  . BREAST EXCISIONAL BIOPSY     breast bxs on left x 2, breast bx of right x 1-all benign  . EYE SURGERY     cataracts bilateral /w IOL  . HERNIA REPAIR     umbilical hernia- [id number]  . JOINT REPLACEMENT  2006,2007   bilateral  .  LAPAROSCOPIC OVARIAN CYSTECTOMY     not done by laparscopy-abdominal incision  . REPLACEMENT TOTAL KNEE BILATERAL    . TONSILLECTOMY      Allergies  Allergen Reactions  . Clindamycin Hcl Other (See Comments)    REACTION: swelling, pt. Reports that she had a 16 lb. weightgain in one day      Medication List       Accurate as of 08/21/16 11:19 AM. Always use your most recent med list.          Brexpiprazole 1 MG Tabs Take 1 tablet by mouth at bedtime.   busPIRone 15 MG tablet Commonly known as:  BUSPAR Take 15 mg by mouth daily.   CALCIUM CITRATE PO Take 600 mg by mouth 2 (two) times daily.   clonazePAM 0.5 MG tablet Commonly known as:  KLONOPIN Take 1 tablet by mouth as needed in middle of the nite to go back to sleep   DULoxetine 60 MG capsule Commonly known as:  CYMBALTA Take 2 capsules (120 mg total) by mouth daily.   furosemide 40 MG  tablet Commonly known as:  LASIX 1/2 by mouth daily   levothyroxine 50 MCG tablet Commonly known as:  SYNTHROID, LEVOTHROID TAKE 1 TABLET (50 MCG TOTAL) BY MOUTH EVERY MORNING.   meloxicam 15 MG tablet Commonly known as:  MOBIC TAKE 1 TABLET EVERY DAY   mirtazapine 15 MG tablet Commonly known as:  REMERON Take 7.5 mg by mouth at bedtime.   MULTIVITAMIN PO Take 1 tablet by mouth.   MYRBETRIQ 25 MG Tb24 tablet Generic drug:  mirabegron ER Take 25 mg by mouth daily.   oxyCODONE-acetaminophen 10-325 MG tablet Commonly known as:  PERCOCET Take 1-2 tablets by mouth every 4-6 hours as needed for Arthritis pain   RESTASIS 0.05 % ophthalmic emulsion Generic drug:  cycloSPORINE PLACE 1 DROP INTO BOTH EYES TWICE A DAY   traZODone 100 MG tablet Commonly known as:  DESYREL Take 2 tablets by mouth at bedtime   warfarin 5 MG tablet Commonly known as:  COUMADIN Take 1 tablet daily except 1/2 tablet on  Fridays       Review of Systems:  Review of Systems  Constitutional: Positive for malaise/fatigue. Negative for chills and fever.  HENT: Positive for hearing loss.   Eyes: Negative for blurred vision.  Respiratory: Negative for cough and shortness of breath.   Cardiovascular: Negative for chest pain, palpitations and leg swelling.  Gastrointestinal: Negative for abdominal pain, blood in stool, constipation and melena.  Genitourinary: Negative for dysuria.  Musculoskeletal: Positive for back pain. Negative for falls.  Skin: Negative for rash.  Neurological: Positive for tingling and sensory change. Negative for dizziness and weakness.       Loss of sensation in toes bilaterally  Psychiatric/Behavioral: Positive for depression. Negative for memory loss and suicidal ideas. The patient is not nervous/anxious and does not have insomnia.     Health Maintenance  Topic Date Due  . URINE MICROALBUMIN  06/16/1947  . TETANUS/TDAP  11/21/2015  . ZOSTAVAX  05/18/2017 (Originally  06/15/1997)  . INFLUENZA VACCINE  Completed  . DEXA SCAN  Completed  . PNA vac Low Risk Adult  Completed    Physical Exam: Vitals:   08/21/16 1111  BP: 110/62  Pulse: 70  Temp: 98.8 F (37.1 C)  TempSrc: Oral  SpO2: 96%  Weight: 188 lb (85.3 kg)   Body mass index is 35.52 kg/m. Physical Exam  Constitutional: She is oriented to person, place, and  time. No distress.  Hygiene not as good today--incontinence and feet not clean  Cardiovascular: Intact distal pulses.   irreg irreg  Pulmonary/Chest: Effort normal and breath sounds normal. No respiratory distress.  Abdominal: Soft. Bowel sounds are normal.  Musculoskeletal: She exhibits no tenderness or deformity.  Walks with walker  Neurological: She is alert and oriented to person, place, and time.  Skin: Skin is warm and dry. Capillary refill takes less than 2 seconds.  Toes on left foot slightly purplish blue, DP pulses intact bilaterally; feet slightly cool and unable to feel monofilament bilaterally on toes  Psychiatric: She has a normal mood and affect.    Labs reviewed: Basic Metabolic Panel:  Recent Labs  09/15/15 0816 12/20/15 1357 04/24/16 0928 08/14/16  NA 141 140 144 139  K 4.3 4.5 4.2 4.5  CL 100 98 101  --   CO2 27 27 24   --   GLUCOSE 89 121* 99  --   BUN 22 22 20  28*  CREATININE 0.91 1.02* 0.80 0.9  CALCIUM 9.2 9.2 9.6  --   TSH  --  1.900  --  3.11   Liver Function Tests:  Recent Labs  09/15/15 0816 12/20/15 1357 08/14/16  AST 18 17 17   ALT 8 10 8   ALKPHOS 71 70 52  BILITOT <0.2 0.2  --   PROT 5.9* 6.2  --   ALBUMIN 4.0 4.2  --    No results for input(s): LIPASE, AMYLASE in the last 8760 hours. No results for input(s): AMMONIA in the last 8760 hours. CBC:  Recent Labs  09/15/15 0816 08/14/16  WBC 5.1 6.1  NEUTROABS 2.4  --   HGB  --  12.6  HCT 40.2 38  MCV 89  --   PLT 199 194   Lipid Panel:  Recent Labs  09/15/15 0816 04/24/16 0928  CHOL 169 171  HDL 46 58  LDLCALC 102*  90  TRIG 107 117  CHOLHDL 3.7 2.9   Lab Results  Component Value Date   HGBA1C 5.6 04/24/2016    Assessment/Plan 1. Numbness of toes -suspect circulatory given discoloration, has borderline DMII also, but control fabulous so seems she should not be developing new neuropathy - obtain arterial dopplers with abis bilaterally to evaluate the arterial circulation of her legs - VAS [place] ABI WITH/WO TBI; Future  2. Paroxysmal a-fib ([facility]) - continues on coumadin with INR goal 2-3 - no signs of emboli - VAS [place] ABI WITH/WO TBI; Future  3. Recurrent major depressive disorder, in partial remission ([place]) - doing better with medication adjustments by psychiatry, no SI/HI -TSH was normal and INR should not affect her spirits  4. Chronic pain syndrome -recently her back bothers her off and on, worst with prolonged standing, but relieved with rest and medications  5. Hypothyroidism, unspecified type -last tsh wnl, cont same synthroid and monitor  6. Chronic anticoagulation - POC INR was 1.7--cont coumadinn 5mg  full tablet daily - recently stopped the herbal supplement which could have had ingredients to alter coag pathway, will not increase coumadin now (for fear it will skyrocket) before her visit with Dr. Sabra Heck upcoming  Labs/tests ordered:  Orders Placed This Encounter  Procedures  . POC INR  arterial dopplers with ABIs  Next appt:  09/04/2016   Rage Beever L. Quinnton Bury, D.O. [facility] [address], [place] [address] Cell Phone (Mon-Fri 8am-5pm):  [phone] On Call:  [phone] & follow prompts after 5pm &  weekends Office Phone:  [phone] Office Fax:  [phone]

## 2016-08-21 NOTE — Patient Instructions (Signed)
Increase warfarin to 5mg  one full tablet by mouth daily and keep scheduled appt to recheck with [name].

## 2016-08-21 NOTE — Telephone Encounter (Signed)
Andrea [name] aware, she took her to the appointment.

## 2016-08-31 ENCOUNTER — Ambulatory Visit (HOSPITAL_COMMUNITY)
Admission: RE | Admit: 2016-08-31 | Discharge: 2016-08-31 | Disposition: A | Discharge status: Home / Self Care | Payer: Medicare Other | Source: Ambulatory Visit | Attending: Vascular Surgery | Admitting: Vascular Surgery

## 2016-08-31 DIAGNOSIS — R200 Anesthesia of skin: Secondary | ICD-10-CM

## 2016-08-31 DIAGNOSIS — I480 Paroxysmal atrial fibrillation: Secondary | ICD-10-CM | POA: Insufficient documentation

## 2016-09-04 ENCOUNTER — Encounter: Payer: Self-pay | Admitting: Pharmacotherapy

## 2016-09-04 ENCOUNTER — Ambulatory Visit (INDEPENDENT_AMBULATORY_CARE_PROVIDER_SITE_OTHER): Payer: Medicare Other | Admitting: Pharmacotherapy

## 2016-09-04 VITALS — BP 140/82 | HR 77 | Temp 98.4°F | Resp 20 | Ht 61.0 in | Wt 188.2 lb

## 2016-09-04 DIAGNOSIS — Z7901 Long term (current) use of anticoagulants: Secondary | ICD-10-CM

## 2016-09-04 DIAGNOSIS — I480 Paroxysmal atrial fibrillation: Secondary | ICD-10-CM | POA: Diagnosis not present

## 2016-09-04 LAB — POCT INR: INR: 2.8

## 2016-09-04 NOTE — Progress Notes (Signed)
   Subjective:    Patient ID: Andrea [name], female    DOB: [birth year], 81 y.o.   MRN: [id number]  HPI Last INR was 1.7.  Coumadin was increased to 5mg  QD Denies missed doses. Denies unusual bleeding or bruising. Denies CP, palpitations, falls Consistent with vitamin K   Review of Systems  HENT: Negative for nosebleeds.   Cardiovascular: Negative for chest pain and palpitations.  Gastrointestinal: Negative for anal bleeding and blood in stool.  Genitourinary: Negative for hematuria.  Hematological: Does not bruise/bleed easily.       Objective:   Physical Exam  Constitutional: She is oriented to person, place, and time. She appears well-developed and well-nourished.  HENT:  Right Ear: External ear normal.  Left Ear: External ear normal.  Cardiovascular: Normal rate, regular rhythm and normal heart sounds.   Pulmonary/Chest: Effort normal and breath sounds normal.  Neurological: She is alert and oriented to person, place, and time.  Skin: Skin is warm and dry.  Psychiatric: She has a normal mood and affect. Her behavior is normal. Judgment and thought content normal.  Vitals reviewed.    INR 2.8 BP: 140/82  HR: 77  Wt: 188.2 lb    Assessment & Plan:  1.  INR at goal 2-3 2.  Continue Coumadin 5mg  QD 3.  RTC 1 month

## 2016-09-04 NOTE — Patient Instructions (Signed)
INR 2.8 Continue Coumadin 5mg  daily

## 2016-09-06 ENCOUNTER — Other Ambulatory Visit: Payer: Self-pay | Admitting: *Deleted

## 2016-09-06 MED ORDER — LEVOTHYROXINE SODIUM 50 MCG PO TABS: ORAL_TABLET | ORAL | 1 refills | Status: DC

## 2016-09-06 NOTE — Telephone Encounter (Signed)
[facility] 

## 2016-09-11 DIAGNOSIS — N3941 Urge incontinence: Secondary | ICD-10-CM | POA: Diagnosis not present

## 2016-09-15 ENCOUNTER — Telehealth: Payer: Self-pay | Admitting: *Deleted

## 2016-09-15 NOTE — Telephone Encounter (Signed)
Received Handicap Placard from Patient requesting Dr. Mariea Clonts to sign and mail to [facility]

## 2016-10-02 ENCOUNTER — Encounter: Payer: Self-pay | Admitting: Pharmacotherapy

## 2016-10-02 ENCOUNTER — Ambulatory Visit (INDEPENDENT_AMBULATORY_CARE_PROVIDER_SITE_OTHER): Payer: Medicare Other | Admitting: Pharmacotherapy

## 2016-10-02 VITALS — BP 118/72 | HR 64 | Resp 12 | Ht 61.0 in | Wt 189.0 lb

## 2016-10-02 DIAGNOSIS — Z7901 Long term (current) use of anticoagulants: Secondary | ICD-10-CM | POA: Diagnosis not present

## 2016-10-02 DIAGNOSIS — I480 Paroxysmal atrial fibrillation: Secondary | ICD-10-CM

## 2016-10-02 LAB — POCT INR: INR: 2.5

## 2016-10-02 NOTE — Progress Notes (Signed)
   Subjective:    Patient ID: Andrea [name], female    DOB: 1935/07/04, 81 y.o.   MRN: [id number]  HPI Last INR in October was OK at 2.8 Current Coumadin dose is 5mg  QD Denies missed doses. Denies unusual bleeding or bruising. Denies CP, palpitations, falls Consistent with vitamin K intake   Review of Systems  HENT: Positive for hearing loss. Negative for nosebleeds.   Cardiovascular: Negative for chest pain and palpitations.  Gastrointestinal: Negative for anal bleeding and blood in stool.  Genitourinary: Negative for hematuria.  Hematological: Does not bruise/bleed easily.       Objective:   Physical Exam  Constitutional: She is oriented to person, place, and time. She appears well-developed and well-nourished.  HENT:  Right Ear: External ear normal.  Left Ear: External ear normal.  Cardiovascular: Normal rate, regular rhythm and normal heart sounds.   Pulmonary/Chest: Effort normal.  Neurological: She is alert and oriented to person, place, and time.  Skin: Skin is warm and dry.  Psychiatric: She has a normal mood and affect. Her behavior is normal. Judgment and thought content normal.  Vitals reviewed.    INR 2.5 BP:  118/72  HR: 64  Wt: 189lb     Assessment & Plan:  1.  INR at goal 2-3 2.  Continue Coumadin 5mg  QD 3.  RTC 1 month

## 2016-10-02 NOTE — Patient Instructions (Signed)
INR 2.5 Continue Coumadin 5mg daily 

## 2016-10-23 ENCOUNTER — Encounter: Payer: Self-pay | Admitting: Pharmacotherapy

## 2016-10-23 ENCOUNTER — Other Ambulatory Visit: Payer: Self-pay | Admitting: Internal Medicine

## 2016-10-23 DIAGNOSIS — I82409 Acute embolism and thrombosis of unspecified deep veins of unspecified lower extremity: Secondary | ICD-10-CM

## 2016-10-25 DIAGNOSIS — L603 Nail dystrophy: Secondary | ICD-10-CM | POA: Diagnosis not present

## 2016-10-25 DIAGNOSIS — I739 Peripheral vascular disease, unspecified: Secondary | ICD-10-CM | POA: Diagnosis not present

## 2016-10-25 DIAGNOSIS — L84 Corns and callosities: Secondary | ICD-10-CM | POA: Diagnosis not present

## 2016-10-30 ENCOUNTER — Ambulatory Visit: Payer: Medicare Other | Admitting: Pharmacotherapy

## 2016-11-06 ENCOUNTER — Ambulatory Visit (INDEPENDENT_AMBULATORY_CARE_PROVIDER_SITE_OTHER): Payer: Medicare Other | Admitting: Pharmacotherapy

## 2016-11-06 ENCOUNTER — Encounter: Payer: Self-pay | Admitting: Pharmacotherapy

## 2016-11-06 VITALS — BP 118/66 | HR 64 | Resp 12 | Ht 61.0 in | Wt 196.0 lb

## 2016-11-06 DIAGNOSIS — I480 Paroxysmal atrial fibrillation: Secondary | ICD-10-CM

## 2016-11-06 DIAGNOSIS — Z7901 Long term (current) use of anticoagulants: Secondary | ICD-10-CM

## 2016-11-06 LAB — POCT INR: INR: 2.7

## 2016-11-06 NOTE — Progress Notes (Signed)
   Subjective:    Patient ID: Andrea [name], female    DOB: [birth year], 81 y.o.   MRN: [id number]  HPI Last INR on 10/02/16 was OK at 2.5 Coumadin 5mg  daily. Denies missed doses. Denies unusual bleeding or bruising. Denies CP, palpitations, falls. Consistent with vitamin K.   Review of Systems  HENT: Negative for nosebleeds.   Cardiovascular: Negative for chest pain and palpitations.  Gastrointestinal: Negative for anal bleeding and blood in stool.  Genitourinary: Negative for hematuria.  Hematological: Does not bruise/bleed easily.       Objective:   Physical Exam  Constitutional: She is oriented to person, place, and time. She appears well-developed and well-nourished.  HENT:  Right Ear: External ear normal.  Left Ear: External ear normal.  Cardiovascular: Normal rate, regular rhythm and normal heart sounds.   Pulmonary/Chest: Effort normal and breath sounds normal.  Neurological: She is alert and oriented to person, place, and time.  Skin: Skin is warm and dry.  Psychiatric: She has a normal mood and affect. Her behavior is normal. Judgment and thought content normal.  Vitals reviewed.  BP: 118/66  HR: 64  Wt:  196.8lb INR 2.7       Assessment & Plan:  1.  INR at goal 2-3 2.  Continue Coumadin 5mg  QD 3.  RTC 1 month

## 2016-11-06 NOTE — Patient Instructions (Signed)
INR 2.7 Continue Coumadin 5mg  QD

## 2016-11-13 ENCOUNTER — Other Ambulatory Visit: Payer: Self-pay | Admitting: Internal Medicine

## 2016-11-25 ENCOUNTER — Other Ambulatory Visit: Payer: Self-pay | Admitting: Internal Medicine

## 2016-11-27 ENCOUNTER — Ambulatory Visit (INDEPENDENT_AMBULATORY_CARE_PROVIDER_SITE_OTHER): Payer: Medicare Other

## 2016-11-27 ENCOUNTER — Ambulatory Visit (INDEPENDENT_AMBULATORY_CARE_PROVIDER_SITE_OTHER): Payer: Medicare Other | Admitting: Internal Medicine

## 2016-11-27 ENCOUNTER — Encounter: Payer: Self-pay | Admitting: Internal Medicine

## 2016-11-27 VITALS — BP 128/60 | HR 65 | Temp 98.8°F | Ht 61.0 in | Wt 198.4 lb

## 2016-11-27 VITALS — BP 128/60 | HR 65 | Temp 98.8°F | Ht 61.0 in | Wt 198.0 lb

## 2016-11-27 DIAGNOSIS — R200 Anesthesia of skin: Secondary | ICD-10-CM | POA: Insufficient documentation

## 2016-11-27 DIAGNOSIS — G4733 Obstructive sleep apnea (adult) (pediatric): Secondary | ICD-10-CM

## 2016-11-27 DIAGNOSIS — Z23 Encounter for immunization: Secondary | ICD-10-CM | POA: Diagnosis not present

## 2016-11-27 DIAGNOSIS — I480 Paroxysmal atrial fibrillation: Secondary | ICD-10-CM | POA: Diagnosis not present

## 2016-11-27 DIAGNOSIS — F309 Manic episode, unspecified: Secondary | ICD-10-CM | POA: Diagnosis not present

## 2016-11-27 DIAGNOSIS — G894 Chronic pain syndrome: Secondary | ICD-10-CM | POA: Diagnosis not present

## 2016-11-27 DIAGNOSIS — R739 Hyperglycemia, unspecified: Secondary | ICD-10-CM

## 2016-11-27 DIAGNOSIS — M5416 Radiculopathy, lumbar region: Secondary | ICD-10-CM

## 2016-11-27 DIAGNOSIS — E039 Hypothyroidism, unspecified: Secondary | ICD-10-CM | POA: Diagnosis not present

## 2016-11-27 DIAGNOSIS — L570 Actinic keratosis: Secondary | ICD-10-CM

## 2016-11-27 DIAGNOSIS — IMO0001 Reserved for inherently not codable concepts without codable children: Secondary | ICD-10-CM

## 2016-11-27 DIAGNOSIS — Z0000 Encounter for general adult medical examination without abnormal findings: Secondary | ICD-10-CM

## 2016-11-27 DIAGNOSIS — Z6837 Body mass index (BMI) 37.0-37.9, adult: Secondary | ICD-10-CM

## 2016-11-27 DIAGNOSIS — E6609 Other obesity due to excess calories: Secondary | ICD-10-CM

## 2016-11-27 DIAGNOSIS — Z9989 Dependence on other enabling machines and devices: Secondary | ICD-10-CM

## 2016-11-27 MED ORDER — OXYCODONE-ACETAMINOPHEN 10-325 MG PO TABS: ORAL_TABLET | ORAL | 0 refills | Status: DC

## 2016-11-27 NOTE — Patient Instructions (Addendum)
Andrea [name] , Thank you for taking time to come for your Medicare Wellness Visit. I appreciate your ongoing commitment to your health goals. Please review the following plan we discussed and let me know if I can assist you in the future.   These are the goals we discussed: Goals    . Weight (lb) < 150 lb (68 kg)          Starting 11/27/2016, I will attempt to decrease my weight by 50 lbs to reach a goal weight of 150 lb.        This is a list of the screening recommended for you and due dates:  Health Maintenance  Topic Date Due  . Urine Protein Check  06/16/1947  . Shingles Vaccine  05/18/2017*  . Tetanus Vaccine  11/20/2017*  . Flu Shot  Completed  . DEXA scan (bone density measurement)  Completed  . Pneumonia vaccines  Completed  *Topic was postponed. The date shown is not the original due date.  Preventive Care for Adults  A healthy lifestyle and preventive care can promote health and wellness. Preventive health guidelines for adults include the following key practices.  . A routine yearly physical is a good way to check with your health care provider about your health and preventive screening. It is a chance to share any concerns and updates on your health and to receive a thorough exam.  . Visit your dentist for a routine exam and preventive care every 6 months. Brush your teeth twice a day and floss once a day. Good oral hygiene prevents tooth decay and gum disease.  . The frequency of eye exams is based on your age, health, family medical history, use  of contact lenses, and other factors. Follow your health care provider's ecommendations for frequency of eye exams.  . Eat a healthy diet. Foods like vegetables, fruits, whole grains, low-fat dairy products, and lean protein foods contain the nutrients you need without too many calories. Decrease your intake of foods high in solid fats, added sugars, and salt. Eat the right amount of calories for you. Get information about a  proper diet from your health care provider, if necessary.  . Regular physical exercise is one of the most important things you can do for your health. Most adults should get at least 150 minutes of moderate-intensity exercise (any activity that increases your heart rate and causes you to sweat) each week. In addition, most adults need muscle-strengthening exercises on 2 or more days a week.  Silver Sneakers may be a benefit available to you. To determine eligibility, you may visit the website: www.silversneakers.com or contact program at 317-480-2680 Mon-Fri between 8AM-8PM.   . Maintain a healthy weight. The body mass index (BMI) is a screening tool to identify possible weight problems. It provides an estimate of body fat based on height and weight. Your health care provider can find your BMI and can help you achieve or maintain a healthy weight.   For adults 20 years and older: ? A BMI below 18.5 is considered underweight. ? A BMI of 18.5 to 24.9 is normal. ? A BMI of 25 to 29.9 is considered overweight. ? A BMI of 30 and above is considered obese.   . Maintain normal blood lipids and cholesterol levels by exercising and minimizing your intake of saturated fat. Eat a balanced diet with plenty of fruit and vegetables. Blood tests for lipids and cholesterol should begin at age 91 and be repeated every 5 years.  If your lipid or cholesterol levels are high, you are over 50, or you are at high risk for heart disease, you may need your cholesterol levels checked more frequently. Ongoing high lipid and cholesterol levels should be treated with medicines if diet and exercise are not working.  . If you smoke, find out from your health care provider how to quit. If you do not use tobacco, please do not start.  . If you choose to drink alcohol, please do not consume more than 2 drinks per day. One drink is considered to be 12 ounces (355 mL) of beer, 5 ounces (148 mL) of wine, or 1.5 ounces (44 mL) of  liquor.  . If you are 54-76 years old, ask your health care provider if you should take aspirin to prevent strokes.  . Use sunscreen. Apply sunscreen liberally and repeatedly throughout the day. You should seek shade when your shadow is shorter than you. Protect yourself by wearing long sleeves, pants, a wide-brimmed hat, and sunglasses year round, whenever you are outdoors.  . Once a month, do a whole body skin exam, using a mirror to look at the skin on your back. Tell your health care provider of new moles, moles that have irregular borders, moles that are larger than a pencil eraser, or moles that have changed in shape or color.

## 2016-11-27 NOTE — Progress Notes (Signed)
   I reviewed health advisor's note, was available for consultation and agree with the assessment and plan as written.  Pt's urine was sent for micro today as she cannot take ace/arb.  Td was given b/c we've been trying to get her or her caregiver to check on tdap for years and it doesn't happen.  Still needs zostavax but we opted to wait and see on the new vaccine which is more efficacious.  Itchy, nonpainful actinic keratosis on her back was addressed--no intervention unless if begins to bother her in which case derm can freeze it off.  Dorenda Pfannenstiel L. Roderick Sweezy, D.O. [facility] [address], [place] [address] Cell Phone (Mon-Fri 8am-5pm):  [phone] On Call:  [phone] & follow prompts after 5pm & weekends Office Phone:  [phone] Office Fax:  [phone]   Quick Notes   Health Maintenance:  Pt due for urine micro. Pt to ask pharmacy about OOP TDAP cost.     Abnormal Screen: None; MMSE-30/30 Passed Clock Test    Patient Concerns:  Pt has a spot on her back that she would like to have looked at; pt also requests to have her pain meds refilled.     Nurse Concerns:  None

## 2016-11-27 NOTE — Progress Notes (Signed)
Subjective:   Andrea [name] is a 82 y.o. female who presents for Medicare Annual (Subsequent) preventive examination.  Review of Systems:   Cardiac Risk Factors include: advanced age (>67men, >35 women);family history of premature cardiovascular disease;obesity (BMI >30kg/m2);smoking/ tobacco exposure;sedentary lifestyle     Objective:     Vitals: BP 128/60 (BP Location: Left Arm, Patient Position: Sitting, Cuff Size: Normal)   Pulse 65   Temp 98.8 F (37.1 C) (Oral)   Ht 5\' 1"  (1.549 m)   Wt 198 lb 6.4 oz (90 kg)   SpO2 96%   BMI 37.49 kg/m   Body mass index is 37.49 kg/m.   Tobacco History  Smoking Status  . Former Smoker  . Packs/day: 0.50  . Years: 20.00  . Types: Cigarettes  . Quit date: 11/20/1968  Smokeless Tobacco  . Never Used    Comment: smoked for approx. 10 years     Counseling given: No   Past Medical History:  Diagnosis Date  . Anemia   . Anxiety   . Arthritis    hands, spine   . Blood transfusion   . Cervicalgia   . Constipation   . Cough    Wert-onset 08/2009, as of 2014- resolved   . Deep venous thrombosis ([place])    post op, rec'd/needed  blood thinner   . Depression   . Depression with anxiety   . Dysrhythmia   . Falls frequently    pt. reports that she was falling 3 times a day, has had PT at a facility for a while & now is getting home PT 2-3 times/ week   . Headache(784.0) 09/27/2011  . History of stress test    15-20 yrs.ago   . Hypotension   . Hypothyroidism   . Lumbar radiculopathy 09/26/2011  . Pneumonia    never been in hosp. for pneumonia   . Prediabetes   . Right hand fracture    Monday  . Shortness of breath   . Sleep apnea    doesn't use CPAP any longer, since weight loss   Past Surgical History:  Procedure Laterality Date  . ANTERIOR CERVICAL DECOMP/DISCECTOMY FUSION N/A 07/09/2013   Procedure: ANTERIOR CERVICAL DECOMPRESSION/DISCECTOMY FUSION 3 LEVELS;  Surgeon: Sinclair Ship, MD;  Location: [facility];   Service: Orthopedics;  Laterality: N/A;  Anterior cervical decompression fusion, cervical 3-4, cervical 4-5-, cervical 5-6 with instrumentation, allograft.  . APPENDECTOMY    . BACK SURGERY     2000  . BREAST EXCISIONAL BIOPSY     breast bxs on left x 2, breast bx of right x 1-all benign  . EYE SURGERY     cataracts bilateral /w IOL  . HERNIA REPAIR     umbilical hernia- [id number]  . JOINT REPLACEMENT  2006,2007   bilateral  . LAPAROSCOPIC OVARIAN CYSTECTOMY     not done by laparscopy-abdominal incision  . REPLACEMENT TOTAL KNEE BILATERAL    . TONSILLECTOMY     Family History  Problem Relation Age of Onset  . Heart disease Father   . Malignant hyperthermia Father   . Arthritis Father   . Deep vein thrombosis Son   . Diabetes Son   . Obesity Son   . Depression Son   . Hypertension Son   . Parkinson's disease Mother   . Diabetes Daughter   . Hypertension Daughter   . Anxiety disorder Daughter   . Hyperlipidemia Son   . Hypertension Son   . Cancer Maternal Aunt  History  Sexual Activity  . Sexual activity: No    Outpatient Encounter Prescriptions as of 11/27/2016  Medication Sig  . Brexpiprazole 1 MG TABS Take 1 tablet by mouth at bedtime.  . busPIRone (BUSPAR) 15 MG tablet Take 15 mg by mouth daily.  [name] CALCIUM CITRATE PO Take 600 mg by mouth 2 (two) times daily.  . clonazePAM (KLONOPIN) 0.5 MG tablet Take 1 tablet by mouth as needed in middle of the nite to go back to sleep  . DULoxetine (CYMBALTA) 60 MG capsule Take 2 capsules (120 mg total) by mouth daily.  . furosemide (LASIX) 40 MG tablet 1/2 by mouth daily  . levothyroxine (SYNTHROID, LEVOTHROID) 50 MCG tablet Take one tablet by mouth once daily 30 minutes before breakfast for thyroid  . meloxicam (MOBIC) 15 MG tablet TAKE 1 TABLET EVERY DAY  . mirabegron ER (MYRBETRIQ) 25 MG TB24 tablet Take 25 mg by mouth daily.  . mirtazapine (REMERON) 15 MG tablet Take 7.5 mg by mouth at bedtime.  . Multiple Vitamins-Minerals  (MULTIVITAMIN PO) Take 1 tablet by mouth.  . oxyCODONE-acetaminophen (PERCOCET) 10-325 MG tablet Take 1-2 tablets by mouth every 4-6 hours as needed for Arthritis pain  . RESTASIS 0.05 % ophthalmic emulsion PLACE 1 DROP INTO BOTH EYES TWICE A DAY  . traZODone (DESYREL) 100 MG tablet Take 2 tablets by mouth at bedtime  . warfarin (COUMADIN) 5 MG tablet Take 1 tablet daily   No facility-administered encounter medications on file as of 11/27/2016.     Activities of Daily Living In your present state of health, do you have any difficulty performing the following activities: 11/27/2016  Hearing? Y  Vision? N  Difficulty concentrating or making decisions? Y  Walking or climbing stairs? N  Dressing or bathing? N  Doing errands, shopping? N  Preparing Food and eating ? N  Using the Toilet? N  In the past six months, have you accidently leaked urine? Y  Do you have problems with loss of bowel control? N  Managing your Medications? Y  Managing your Finances? N  Housekeeping or managing your Housekeeping? Y  Some recent data might be hidden    Patient Care Team: Gayland Curry, DO as PCP - General (Geriatric Medicine) Tanda Rockers, MD as Consulting Physician (Pulmonary Disease)    Assessment:     Exercise Activities and Dietary recommendations Current Exercise Habits: The patient does not participate in regular exercise at present, Exercise limited by: None identified  Goals    . Weight (lb) < 150 lb (68 kg)          Starting 11/27/2016, I will attempt to decrease my weight by 50 lbs to reach a goal weight of 150 lb.       Fall Risk Fall Risk  11/27/2016 11/06/2016 10/02/2016 08/21/2016 07/10/2016  Falls in the past year? Yes Yes Yes No Yes  Number falls in past yr: 1 2 or more 2 or more - 2 or more  Injury with Fall? No No No - Yes  Risk for fall due to : History of fall(s);Impaired balance/gait - - - -  Follow up Falls prevention discussed - - - -   Depression Screen PHQ 2/9  Scores 11/27/2016 08/21/2016 04/20/2016 05/31/2015  PHQ - 2 Score 1 0 2 0  PHQ- 9 Score - - 2 -     Cognitive Function MMSE - Mini Mental State Exam 11/27/2016 03/05/2014  Orientation to time 5 5  Orientation to Place 5  5  Registration 3 3  Attention/ Calculation 5 5  Recall 3 3  Language- name 2 objects 2 2  Language- repeat 1 1  Language- follow 3 step command 3 3  Language- read & follow direction 1 1  Write a sentence 1 1  Copy design 1 1  Total score 30 30        Immunization History  Administered Date(s) Administered  . Influenza Split 08/14/2013  . Influenza Whole 08/20/2009  . Influenza,inj,Quad PF,36+ Mos 08/04/2014, 09/06/2015, 07/10/2016  . Pneumococcal Conjugate-13 01/14/2015  . Pneumococcal Polysaccharide-23 07/11/2013  . Td 11/20/2005   Screening Tests Health Maintenance  Topic Date Due  . URINE MICROALBUMIN  06[birth year]/1947  . ZOSTAVAX  05/18/2017 (Originally 06/15/1997)  . TETANUS/TDAP  11/20/2017 (Originally 11/21/2015)  . INFLUENZA VACCINE  Completed  . DEXA SCAN  Completed  . PNA vac Low Risk Adult  Completed      Plan:    I have personally reviewed and addressed the Medicare Annual Wellness questionnaire and have noted the following in the patient's chart:  A. Medical and social history B. Use of alcohol, tobacco or illicit drugs  C. Current medications and supplements D. Functional ability and status E.  Nutritional status F.  Physical activity G. Advance directives H. List of other physicians I.  Hospitalizations, surgeries, and ER visits in previous 12 months J.  [place] to include hearing, vision, cognitive, depression L. Referrals and appointments - none  In addition, I have reviewed and discussed with patient certain preventive protocols, quality metrics, and best practice recommendations. A written personalized care plan for preventive services as well as general preventive health recommendations were provided to patient.  See  attached scanned questionnaire for additional information.   Signed,   Allyn Kenner, LPN Health Advisor

## 2016-11-27 NOTE — Progress Notes (Signed)
Location:  [facility] clinic Provider:  Diannia Hogenson L. Mariea Clonts, D.O., C.M.D.  Code Status: DNR Goals of Care:  Advanced Directives 11/27/2016  Does Patient Have a Medical Advance Directive? No  Type of Advance Directive -  Does patient want to make changes to medical advance directive? Yes (ED - Information included in AVS)  Copy of [place] in Chart? -  Would patient like information on creating a medical advance directive? -  Pre-existing out of facility DNR order (yellow form or pink MOST form) -   Chief Complaint  Patient presents with  . Medical Management of Chronic Issues    HPI: Patient is a 82 y.o. Andrea [name] seen today for medical management of chronic diseases.  She saw Delrae Rend, LPN for her AWV.    She has yet to check with pharmacy at tdap.  We can give Td here so we'll take care of that.    Needs urine microalbumin.  She says she ate with both hands since before Christmas.  She wants to lose it again and asks about appetite suppressants.  Has a skin spot on her back she'd like me to check. Has raised actinic keratosis near the bottom of her bra line.  Itchy once in a while--rubs her back against a door to get to it.  No pain.    Spirits are better again.   Mood stabilized with adjustments in meds from her psychiatrist.   Needs her pain pills renewed.  She is doing fairly well on her pain with them, but has not had to fill the Rx since August.    She is taking her klonopin and a pain pill at night and she is able to sleep through the night.  Not waking up hurting.  Injections in her back last for 3 mos, but taking the pills is easier for her.    TSH normal in September.  ABIs showed no arterial disease in either leg 08/31/16.  No more pains in her feet, the toes still feel funny.  Past Medical History:  Diagnosis Date  . Anemia   . Anxiety   . Arthritis    hands, spine   . Blood transfusion   . Cervicalgia   . Constipation   . Cough    Wert-onset 08/2009,  as of 2014- resolved   . Deep venous thrombosis ([place])    post op, rec'd/needed  blood thinner   . Depression   . Depression with anxiety   . Dysrhythmia   . Falls frequently    pt. reports that she was falling 3 times a day, has had PT at a facility for a while & now is getting home PT 2-3 times/ week   . Headache(784.0) Andrea/05/2011  . History of stress test    15-20 yrs.ago   . Hypotension   . Hypothyroidism   . Lumbar radiculopathy Andrea/04/2011  . Pneumonia    never been in hosp. for pneumonia   . Prediabetes   . Right hand fracture    Monday  . Shortness of breath   . Sleep apnea    doesn't use CPAP any longer, since weight loss    Past Surgical History:  Procedure Laterality Date  . ANTERIOR CERVICAL DECOMP/DISCECTOMY FUSION N/A 07/09/2013   Procedure: ANTERIOR CERVICAL DECOMPRESSION/DISCECTOMY FUSION 3 LEVELS;  Surgeon: Sinclair Ship, MD;  Location: [facility];  Service: Orthopedics;  Laterality: N/A;  Anterior cervical decompression fusion, cervical 3-4, cervical 4-5-, cervical 5-6 with instrumentation, allograft.  [name]  APPENDECTOMY    . BACK SURGERY     2000  . BREAST EXCISIONAL BIOPSY     breast bxs on left x 2, breast bx of right x 1-all benign  . EYE SURGERY     cataracts bilateral /w IOL  . HERNIA REPAIR     umbilical hernia- [id number]  . JOINT REPLACEMENT  2006,2007   bilateral  . LAPAROSCOPIC OVARIAN CYSTECTOMY     not done by laparscopy-abdominal incision  . REPLACEMENT TOTAL KNEE BILATERAL    . TONSILLECTOMY      Allergies  Allergen Reactions  . Clindamycin Hcl Other (See Comments)    REACTION: swelling, pt. Reports that she had a 16 lb. weightgain in one day    Allergies as of 11/27/2016      Reactions   Clindamycin Hcl Other (See Comments)   REACTION: swelling, pt. Reports that she had a 16 lb. weightgain in one day      Medication List       Accurate as of 11/27/16  9:24 AM. Always use your most recent med list.          Brexpiprazole 1 MG  Tabs Take 1 tablet by mouth at bedtime.   busPIRone 15 MG tablet Commonly known as:  BUSPAR Take 15 mg by mouth daily.   CALCIUM CITRATE PO Take 600 mg by mouth 2 (two) times daily.   clonazePAM 0.5 MG tablet Commonly known as:  KLONOPIN Take 1 tablet by mouth as needed in middle of the nite to go back to sleep   DULoxetine 60 MG capsule Commonly known as:  CYMBALTA Take 2 capsules (120 mg total) by mouth daily.   furosemide 40 MG tablet Commonly known as:  LASIX 1/2 by mouth daily   levothyroxine 50 MCG tablet Commonly known as:  SYNTHROID, LEVOTHROID Take one tablet by mouth once daily 30 minutes before breakfast for thyroid   meloxicam 15 MG tablet Commonly known as:  MOBIC TAKE 1 TABLET EVERY DAY   mirtazapine 15 MG tablet Commonly known as:  REMERON Take 7.5 mg by mouth at bedtime.   MULTIVITAMIN PO Take 1 tablet by mouth.   MYRBETRIQ 25 MG Tb24 tablet Generic drug:  mirabegron ER Take 25 mg by mouth daily.   oxyCODONE-acetaminophen 10-325 MG tablet Commonly known as:  PERCOCET Take 1-2 tablets by mouth every 4-6 hours as needed for Arthritis pain   RESTASIS 0.05 % ophthalmic emulsion Generic drug:  cycloSPORINE PLACE 1 DROP INTO BOTH EYES TWICE A DAY   traZODone 100 MG tablet Commonly known as:  DESYREL Take 2 tablets by mouth at bedtime   warfarin 5 MG tablet Commonly known as:  COUMADIN Take 1 tablet daily       Review of Systems:  Review of Systems  Constitutional: Negative for chills, fever, malaise/fatigue and weight loss.  HENT: Positive for hearing loss. Negative for congestion.   Eyes: Negative for blurred vision.       Glasses  Respiratory: Negative for cough and shortness of breath.   Cardiovascular: Negative for chest pain and leg swelling.  Genitourinary: Positive for frequency and urgency. Negative for dysuria.       Urge incontinence  Musculoskeletal: Positive for back pain and joint pain. Negative for falls.  Skin: Negative  for itching and rash.       Itchy spot on her left side of her back   Neurological: Positive for tingling and sensory change. Negative for dizziness, loss of consciousness and weakness.  In toes  Psychiatric/Behavioral: Positive for depression and memory loss. The patient is nervous/anxious and has insomnia.        Controlled (bipolar 1) with regimen she's on    Health Maintenance  Topic Date Due  . URINE MICROALBUMIN  06[birth year]16[birth year]1947  . ZOSTAVAX  05/18/2017 (Originally 06/15/1997)  . TETANUS/TDAP  11/20/2017 (Originally 11/21/2015)  . INFLUENZA VACCINE  Completed  . DEXA SCAN  Completed  . PNA vac Low Risk Adult  Completed    Physical Exam: Vitals:   11/27/16 0912  BP: 128/60  Pulse: 65  Temp: 98.8 F (37.1 C)  TempSrc: Oral  SpO2: 96%  Weight: 198 lb (89.8 kg)  Height: _0  (1.549 m)   Body mass index is 37.41 kg/m. Physical Exam  Constitutional: She is oriented to person, place, and time. She appears well-developed and well-nourished. No distress.  HENT:  Head: Normocephalic and atraumatic.  Cardiovascular: Normal rate, regular rhythm, normal heart sounds and intact distal pulses.   Pulmonary/Chest: Effort normal and breath sounds normal. No respiratory distress.  Abdominal: Soft. Bowel sounds are normal. She exhibits no distension. There is no tenderness.  Musculoskeletal: Normal range of motion.  Walks with stooped posture with walker  Neurological: She is alert and oriented to person, place, and time. A sensory deficit is present. No cranial nerve deficit.  In feet  Skin: Skin is warm and dry.  Stuck-on papule, irregular, dry, scaly near inferior bra line on left side posteriorly  Psychiatric: She has a normal mood and affect.    Labs reviewed: Basic Metabolic Panel:  Recent Labs  12/20/15 1357 04/24/16 0928 08/14/16  NA 140 144 139  K 4.5 4.2 4.5  CL 98 101  --   CO2 27 24  --   GLUCOSE 121* 99  --   BUN 22 20 28*  CREATININE 1.02* 0.80 0.9  CALCIUM  9.2 9.6  --   TSH 1.900  --  3.Andrea   Liver Function Tests:  Recent Labs  12/20/15 1357 08/14/16  AST 17 17  ALT 10 8  ALKPHOS 70 52  BILITOT 0.2  --   PROT 6.2  --   ALBUMIN 4.2  --    No results for input(s): LIPASE, AMYLASE in the last 8760 hours. No results for input(s): AMMONIA in the last 8760 hours. CBC:  Recent Labs  08/14/16  WBC 6.1  HGB 12.6  HCT 38  PLT 194   Lipid Panel:  Recent Labs  04/24/16 0928  CHOL 171  HDL 58  LDLCALC 90  TRIG 117  CHOLHDL 2.9   Lab Results  Component Value Date   HGBA1C 5.6 04/24/2016    Assessment/Plan 1. OSA on CPAP -stable, cont cpap at hs, no tolerability issues noted  2. Paroxysmal a-fib (HCC) -follows with Tivis Ringer for coumadin mgt here with goal INR 2-3, also h/o DVTs Rate controlled, today regular sinus  3. Hyperglycemia - check urine microalbumin today due to DMII coming up at some point in her records though I'm not sure her hba1c ever met those criteria - CBC with Differential/Platelet; Future - COMPLETE METABOLIC PANEL WITH GFR; Future - Hemoglobin A1c; Future - Microalbumin/Creatinine Ratio, Urine  4. Adult hypothyroidism - TSH wnl in sept, cont same levothyroxine - TSH; Future  5. Lumbar radiculopathy -renewed percocet (not filled since august) - not c/o discomfort lately  6. Bipolar I disorder, single manic episode ([place]) -stable on buspar, rexulti, klonopin, cymbalta, remeron and trazodone through her psychiatrist  -she  is tolerating these meds well despite risks--requires them for her depression (last episode depressive just a few mos ago)  7. Numbness of toes -ongoing, but not painful lately -ABIs were normal (see hpi)  8. Class 2 obesity due to excess calories with serious comorbidity and body mass index (BMI) of 37.0 to 37.9 in adult - has gained 15 lbs since fall -she is going to get back on her diet and exercise routine--previously was down to 181 lbs at one point---seems like a  good initial goal though technically she should weigh 150 at her height - CBC with Differential/Platelet; Future - COMPLETE METABOLIC PANEL WITH GFR; Future - Hemoglobin A1c; Future - Lipid panel; Future  9. Chronic pain syndrome - especially back and knees, controlled with percocet - oxyCODONE-acetaminophen (PERCOCET) 10-325 MG tablet; Take 1-2 tablets by mouth every 4-6 hours as needed for Arthritis pain  Dispense: 180 tablet; Refill: 0  10. Need for TD vaccine -td given today  Andrea. Actinic keratosis -left posterior chest wall at inferior bra strap, monitor, if bothersome to her can be removed at derm   Labs/tests ordered:   Orders Placed This Encounter  Procedures  . CBC with Differential/Platelet    Standing Status:   Future    Standing Expiration Date:   05/27/2017  . COMPLETE METABOLIC PANEL WITH GFR    [facility]    Standing Status:   Future    Standing Expiration Date:   05/27/2017  . Hemoglobin A1c    Standing Status:   Future    Standing Expiration Date:   05/27/2017  . Lipid panel    Standing Status:   Future    Standing Expiration Date:   05/27/2017    Order Specific Question:   Has the patient fasted?    Answer:   Yes  . TSH    Standing Status:   Future    Standing Expiration Date:   05/27/2017  . Microalbumin/Creatinine Ratio, Urine   Next appt:  12/04/2016  Shandora Koogler L. Hykeem Ojeda, D.O. [facility] [address], [place] [address] Cell Phone (Mon-Fri 8am-5pm):  [phone] On Call:  [phone] & follow prompts after 5pm & weekends Office Phone:  [phone] Office Fax:  [phone]

## 2016-11-28 ENCOUNTER — Encounter: Payer: Self-pay | Admitting: *Deleted

## 2016-11-28 LAB — MICROALBUMIN / CREATININE URINE RATIO
Creatinine, Urine: 109 mg/dL (ref 20–320)
Microalb Creat Ratio: 3 mcg/mg creat (ref <30)
Microalb, Ur: 0.3 mg/dL

## 2016-12-04 ENCOUNTER — Ambulatory Visit (INDEPENDENT_AMBULATORY_CARE_PROVIDER_SITE_OTHER): Payer: Medicare Other | Admitting: Pharmacotherapy

## 2016-12-04 ENCOUNTER — Encounter: Payer: Self-pay | Admitting: Pharmacotherapy

## 2016-12-04 VITALS — BP 118/70 | HR 62 | Resp 12 | Ht 61.0 in | Wt 196.0 lb

## 2016-12-04 DIAGNOSIS — I480 Paroxysmal atrial fibrillation: Secondary | ICD-10-CM

## 2016-12-04 DIAGNOSIS — Z7901 Long term (current) use of anticoagulants: Secondary | ICD-10-CM | POA: Diagnosis not present

## 2016-12-04 LAB — POCT INR: INR: 2.9

## 2016-12-04 NOTE — Progress Notes (Signed)
   Subjective:    Patient ID: Andrea [name], female    DOB: [birth year], 82 y.o.   MRN: [id number]  HPI Last INR on 11/06/16 was OK at 2.7 Current Coumadin dose is 5mg  QD Denies missed doses. Denies unusual bleeding or bruising. Denies CP, palpitations, falls Consistent with vitamin K intake.   Review of Systems  HENT: Negative for nosebleeds.   Cardiovascular: Negative for chest pain and palpitations.  Gastrointestinal: Negative for anal bleeding and blood in stool.  Genitourinary: Negative for hematuria.  Hematological: Does not bruise/bleed easily.       Objective:   Physical Exam  Constitutional: She is oriented to person, place, and time. She appears well-developed and well-nourished.  HENT:  Right Ear: External ear normal.  Left Ear: External ear normal.  Cardiovascular: Normal rate, regular rhythm and normal heart sounds.   Pulmonary/Chest: Effort normal and breath sounds normal.  Neurological: She is alert and oriented to person, place, and time.  Skin: Skin is warm and dry.  Psychiatric: She has a normal mood and affect. Her behavior is normal. Judgment and thought content normal.  Vitals reviewed.   Repeat HR: 62 BP: 118/70  Wt: 196lb INR 2.9      Assessment & Plan:  1.  INR at goal 2-3 2.  Continue Coumadin 5mg  QD 3. INR 1 month

## 2016-12-04 NOTE — Patient Instructions (Signed)
INR 2.9 Continue Coumadin 5mg  daily

## 2017-01-01 ENCOUNTER — Encounter: Payer: Self-pay | Admitting: Pharmacotherapy

## 2017-01-01 ENCOUNTER — Ambulatory Visit (INDEPENDENT_AMBULATORY_CARE_PROVIDER_SITE_OTHER): Payer: Medicare Other | Admitting: Pharmacotherapy

## 2017-01-01 VITALS — BP 120/76 | HR 73 | Temp 99.0°F | Ht 61.0 in | Wt 198.0 lb

## 2017-01-01 DIAGNOSIS — I480 Paroxysmal atrial fibrillation: Secondary | ICD-10-CM

## 2017-01-01 DIAGNOSIS — Z7901 Long term (current) use of anticoagulants: Secondary | ICD-10-CM

## 2017-01-01 LAB — POCT INR: INR: 3.8

## 2017-01-01 MED ORDER — WARFARIN SODIUM 5 MG PO TABS: ORAL_TABLET | ORAL | 3 refills | Status: DC

## 2017-01-01 NOTE — Patient Instructions (Signed)
INR 3.8 Decrease Coumadin to 5mg  (1 tablet) daily except 2.5mg  (1/2 tablet) on Mondays and Fridays

## 2017-01-01 NOTE — Progress Notes (Signed)
   Subjective:    Patient ID: Andrea [name], female    DOB: 05-17-1935, 82 y.o.   MRN: [id number]  HPI Last INR on 12/04/16 was OK at 2.9 Current Coumadin dose is 5mg  QD Denies missed doses. Denies unusual bleeding or bruising. Denies CP, palpitations, falls. Consistent with vitamin K intake  Has started a new OTC supplement with cranberry to prevent UTI symptoms.   Review of Systems  HENT: Negative for nosebleeds.   Respiratory: Negative for shortness of breath.   Cardiovascular: Negative for chest pain and palpitations.  Gastrointestinal: Negative for anal bleeding and blood in stool.  Genitourinary: Negative for dysuria and hematuria.  Hematological: Does not bruise/bleed easily.       Objective:   Physical Exam  Constitutional: She is oriented to person, place, and time. She appears well-developed and well-nourished.  HENT:  Right Ear: External ear normal.  Left Ear: External ear normal.  Cardiovascular: Normal rate, regular rhythm and normal heart sounds.   Pulmonary/Chest: Effort normal and breath sounds normal.  Neurological: She is alert and oriented to person, place, and time.  Skin: Skin is warm and dry.  Psychiatric: She has a normal mood and affect. Her behavior is normal. Judgment and thought content normal.  Vitals reviewed.   BP: 120/76  HR: 73  Wt: 198lb INR 3.8     Assessment & Plan:  1.  INR above target 2-3 due to cranberry / warfarin interaction 2.  Decrease warfarin to 5mg  daily except 2.5mg  M/F 3.  RTC in 2-4 weeks

## 2017-01-22 ENCOUNTER — Ambulatory Visit (INDEPENDENT_AMBULATORY_CARE_PROVIDER_SITE_OTHER): Payer: Medicare Other | Admitting: Pharmacotherapy

## 2017-01-22 ENCOUNTER — Encounter: Payer: Self-pay | Admitting: Pharmacotherapy

## 2017-01-22 VITALS — BP 116/79 | HR 56 | Resp 12 | Ht 61.0 in | Wt 199.0 lb

## 2017-01-22 DIAGNOSIS — Z7901 Long term (current) use of anticoagulants: Secondary | ICD-10-CM | POA: Diagnosis not present

## 2017-01-22 DIAGNOSIS — I480 Paroxysmal atrial fibrillation: Secondary | ICD-10-CM

## 2017-01-22 LAB — POCT INR: INR: 2.2

## 2017-01-22 NOTE — Progress Notes (Signed)
   Subjective:    Patient ID: Andrea [name], female    DOB: 1935-10-21, 82 y.o.   MRN: [id number]  HPI Last INR on 01/01/17 was high at 3.8 Coumadin was decreased to 5mg  QD except 2.5mg  M/F Denies missed doses Denies unusual bleeding or bruising. Denies CP, palpitations Did have a fall - landed on buttocks.  Denies injury. Consistent with vitamin K foods.   Review of Systems  HENT: Negative for nosebleeds.   Cardiovascular: Negative for chest pain and palpitations.  Gastrointestinal: Negative for anal bleeding and blood in stool.  Genitourinary: Negative for hematuria.  Hematological: Does not bruise/bleed easily.       Objective:   Physical Exam  Constitutional: She appears well-developed and well-nourished.  HENT:  Right Ear: External ear normal.  Left Ear: External ear normal.  Cardiovascular: Normal rate, regular rhythm and normal heart sounds.   Pulmonary/Chest: Effort normal and breath sounds normal.  Skin: Skin is warm and dry.  Psychiatric: She has a normal mood and affect. Her behavior is normal. Judgment and thought content normal.  Vitals reviewed.  BP: 116/79  HR: 56  Wt: 199lb INR 2.2       Assessment & Plan:  1.  INR at goal 2-3 2.  Continue Coumadin 5mg  QD except 2.5mg  M/F 3.  INR 1 month

## 2017-01-22 NOTE — Patient Instructions (Signed)
INR 2.2 Continue Coumadin 5mg  daily except 2.5mg  Mondays and Fridays

## 2017-02-05 ENCOUNTER — Ambulatory Visit (INDEPENDENT_AMBULATORY_CARE_PROVIDER_SITE_OTHER): Payer: Medicare Other | Admitting: Internal Medicine

## 2017-02-05 ENCOUNTER — Encounter: Payer: Self-pay | Admitting: Internal Medicine

## 2017-02-05 VITALS — BP 132/80 | HR 75 | Temp 98.9°F | Wt 197.0 lb

## 2017-02-05 DIAGNOSIS — M7062 Trochanteric bursitis, left hip: Secondary | ICD-10-CM | POA: Diagnosis not present

## 2017-02-05 MED ORDER — PREDNISONE 5 MG (21) PO TBPK: ORAL_TABLET | ORAL | 0 refills | Status: DC

## 2017-02-05 NOTE — Progress Notes (Signed)
Location:  [facility] clinic Provider: Jessah Danser L. Mariea Clonts, D.O., C.M.D.  Code Status: DNR Goals of Care:  Advanced Directives 11/27/2016  Does Patient Have a Medical Advance Directive? No  Type of Advance Directive -  Does patient want to make changes to medical advance directive? Yes (ED - Information included in AVS)  Copy of [place] in Chart? -  Would patient like information on creating a medical advance directive? -  Pre-existing out of facility DNR order (yellow form or pink MOST form) -     Chief Complaint  Patient presents with  . Acute Visit    left hip pain x 5 days    HPI: Patient is a 82 y.o. female seen today for an acute visit for   Last Thursday she was sitting in her recliner watching TV for about an hour. When she got up she couldn't stand on her left leg. It didn't feel like muscle, she would have sworn it was bone and broken. All weekend she couldn't walk on it. Now she can bear some weight. Since the hip incident she was using a walker and now a cane. She denies any falls, and has no idea where this came from. She does have chronic back pain, but does not feel the same. The pain to her left hip is localized to her left lateral hip, radiation down to mid thigh. She took oxycodone for the pain and a muscle relaxer she found in the cabinet (doesn't remember the name). Also, used ice. Now pain is 4/10, without any medicine today.   She wants to know what it may have been to prevent it from happening again.   Past Medical History:  Diagnosis Date  . Anemia   . Anxiety   . Arthritis    hands, spine   . Blood transfusion   . Cervicalgia   . Constipation   . Cough    Wert-onset 08/2009, as of 2014- resolved   . Deep venous thrombosis ([place])    post op, rec'd/needed  blood thinner   . Depression   . Depression with anxiety   . Dysrhythmia   . Falls frequently    pt. reports that she was falling 3 times a day, has had PT at a facility for a while &  now is getting home PT 2-3 times/ week   . Headache(784.0) 09/27/2011  . History of stress test    15-20 yrs.ago   . Hypotension   . Hypothyroidism   . Lumbar radiculopathy 09/26/2011  . Pneumonia    never been in hosp. for pneumonia   . Prediabetes   . Right hand fracture    Monday  . Shortness of breath   . Sleep apnea    doesn't use CPAP any longer, since weight loss    Past Surgical History:  Procedure Laterality Date  . ANTERIOR CERVICAL DECOMP/DISCECTOMY FUSION N/A 07/09/2013   Procedure: ANTERIOR CERVICAL DECOMPRESSION/DISCECTOMY FUSION 3 LEVELS;  Surgeon: Sinclair Ship, MD;  Location: [facility];  Service: Orthopedics;  Laterality: N/A;  Anterior cervical decompression fusion, cervical 3-4, cervical 4-5-, cervical 5-6 with instrumentation, allograft.  . APPENDECTOMY    . BACK SURGERY     2000  . BREAST EXCISIONAL BIOPSY     breast bxs on left x 2, breast bx of right x 1-all benign  . EYE SURGERY     cataracts bilateral /w IOL  . HERNIA REPAIR     umbilical hernia- 2035  . JOINT  REPLACEMENT  2006,2007   bilateral  . LAPAROSCOPIC OVARIAN CYSTECTOMY     not done by laparscopy-abdominal incision  . REPLACEMENT TOTAL KNEE BILATERAL    . TONSILLECTOMY      Allergies  Allergen Reactions  . Clindamycin Hcl Other (See Comments)    REACTION: swelling, pt. Reports that she had a 16 lb. weightgain in one day    Allergies as of 02/05/2017      Reactions   Clindamycin Hcl Other (See Comments)   REACTION: swelling, pt. Reports that she had a 16 lb. weightgain in one day      Medication List       Accurate as of 02/05/17  3:21 PM. Always use your most recent med list.          Brexpiprazole 1 MG Tabs Take 1 tablet by mouth at bedtime.   busPIRone 15 MG tablet Commonly known as:  BUSPAR Take 15 mg by mouth daily.   CALCIUM CITRATE PO Take 600 mg by mouth 2 (two) times daily.   clonazePAM 0.5 MG tablet Commonly known as:  KLONOPIN Take 1 tablet by mouth as  needed in middle of the nite to go back to sleep   DULoxetine 60 MG capsule Commonly known as:  CYMBALTA Take 2 capsules (120 mg total) by mouth daily.   furosemide 40 MG tablet Commonly known as:  LASIX 1/2 by mouth daily   levothyroxine 50 MCG tablet Commonly known as:  SYNTHROID, LEVOTHROID Take one tablet by mouth once daily 30 minutes before breakfast for thyroid   meloxicam 15 MG tablet Commonly known as:  MOBIC TAKE 1 TABLET EVERY DAY   mirtazapine 15 MG tablet Commonly known as:  REMERON Take 7.5 mg by mouth at bedtime.   MULTIVITAMIN PO Take 1 tablet by mouth.   MYRBETRIQ 25 MG Tb24 tablet Generic drug:  mirabegron ER Take 25 mg by mouth daily.   oxyCODONE-acetaminophen 10-325 MG tablet Commonly known as:  PERCOCET Take 1-2 tablets by mouth every 4-6 hours as needed for Arthritis pain   RESTASIS 0.05 % ophthalmic emulsion Generic drug:  cycloSPORINE PLACE 1 DROP INTO BOTH EYES TWICE A DAY   traZODone 100 MG tablet Commonly known as:  DESYREL Take 2 tablets by mouth at bedtime   warfarin 5 MG tablet Commonly known as:  COUMADIN Take 1 tablet daily except 1/2 tablet on mondays and Fridays       Review of Systems:  Review of Systems  Constitutional: Negative for chills and fever.  HENT: Negative for congestion.   Eyes: Negative for blurred vision.       Glasses  Respiratory: Negative for shortness of breath.   Cardiovascular: Negative for chest pain and palpitations.  Gastrointestinal: Negative for abdominal pain, blood in stool, constipation and melena.  Genitourinary: Negative for dysuria.  Musculoskeletal: Positive for joint pain. Negative for back pain, falls and myalgias.  Neurological: Negative for dizziness and loss of consciousness.  Endo/Heme/Allergies: Does not bruise/bleed easily.  Psychiatric/Behavioral: Positive for memory loss.    Health Maintenance  Topic Date Due  . URINE MICROALBUMIN  11/27/2017  . TETANUS/TDAP  11/27/2026  .  INFLUENZA VACCINE  Completed  . DEXA SCAN  Completed  . PNA vac Low Risk Adult  Completed    Physical Exam: Vitals:   02/05/17 1507  BP: 132/80  Pulse: 75  Temp: 98.9 F (37.2 C)  TempSrc: Oral  SpO2: 95%  Weight: 197 lb (89.4 kg)   Body mass index is  37.22 kg/m. Physical Exam  Constitutional: She is oriented to person, place, and time. She appears well-developed and well-nourished. No distress.  Cardiovascular: Normal rate, regular rhythm, normal heart sounds and intact distal pulses.   Pulmonary/Chest: Effort normal and breath sounds normal. No respiratory distress.  Musculoskeletal: She exhibits tenderness.  Is limping, difficulty getting up due to left hip pain, tender over trochanteric bursa  Neurological: She is alert and oriented to person, place, and time.  Skin: Skin is warm and dry.  Psychiatric: She has a normal mood and affect.    Labs reviewed: Basic Metabolic Panel:  Recent Labs  04/24/16 0928 08/14/16  NA 144 139  K 4.2 4.5  CL 101  --   CO2 24  --   GLUCOSE 99  --   BUN 20 28*  CREATININE 0.80 0.9  CALCIUM 9.6  --   TSH  --  3.11   Liver Function Tests:  Recent Labs  08/14/16  AST 17  ALT 8  ALKPHOS 52   No results for input(s): LIPASE, AMYLASE in the last 8760 hours. No results for input(s): AMMONIA in the last 8760 hours. CBC:  Recent Labs  08/14/16  WBC 6.1  HGB 12.6  HCT 38  PLT 194   Lipid Panel:  Recent Labs  04/24/16 0928  CHOL 171  HDL 58  LDLCALC 90  TRIG 117  CHOLHDL 2.9   Lab Results  Component Value Date   HGBA1C 5.6 04/24/2016    Assessment/Plan 1. Greater trochanteric bursitis of left hip - pt already using mobic and prn oxycodone for pain -has improved on its own with rest -due to pt's glucose, attempts to lose weight, side effects of prednisone, will not immediately tx or perform injection; will prescribe prednisone taper AS NEEDED if pain actually gets worse instead of better only - predniSONE  (STERAPRED UNI-PAK 21 TAB) 5 MG (21) TBPK tablet; Take as directed if bursitis flares up  Dispense: 21 tablet; Refill: 0  Labs/tests ordered:  No orders of the defined types were placed in this encounter.  Next appt:  02/19/2017--keep as scheduled  Rosalene Wardrop L. Atira Borello, D.O. [facility] [address], [place] [address] Cell Phone (Mon-Fri 8am-5pm):  [phone] On Call:  [phone] & follow prompts after 5pm & weekends Office Phone:  [phone] Office Fax:  [phone]

## 2017-02-13 ENCOUNTER — Other Ambulatory Visit: Payer: Self-pay | Admitting: Internal Medicine

## 2017-02-13 DIAGNOSIS — I82409 Acute embolism and thrombosis of unspecified deep veins of unspecified lower extremity: Secondary | ICD-10-CM

## 2017-02-19 ENCOUNTER — Encounter: Payer: Self-pay | Admitting: Pharmacotherapy

## 2017-02-19 ENCOUNTER — Ambulatory Visit (INDEPENDENT_AMBULATORY_CARE_PROVIDER_SITE_OTHER): Payer: Medicare Other | Admitting: Pharmacotherapy

## 2017-02-19 VITALS — BP 120/78 | HR 70 | Temp 98.8°F | Ht 61.0 in | Wt 193.4 lb

## 2017-02-19 DIAGNOSIS — I480 Paroxysmal atrial fibrillation: Secondary | ICD-10-CM | POA: Diagnosis not present

## 2017-02-19 DIAGNOSIS — Z7901 Long term (current) use of anticoagulants: Secondary | ICD-10-CM

## 2017-02-19 LAB — POCT INR: INR: 2.7

## 2017-02-19 NOTE — Patient Instructions (Signed)
INR 2.7  Continue Coumadin 5mg  daily except 2.5mg  Mondays and Fridays

## 2017-02-19 NOTE — Progress Notes (Signed)
   Subjective:    Patient ID: Andrea [name], female    DOB: 27-Dec-1934, 82 y.o.   MRN: [id number]  HPI Last INR on 01/22/17 was OK at 2.2 Current Coumadin dose is 5mg  QD except 2.5mg  M/F Denies CP, palpitations, falls Denies unusual bleeding or bruising Denies missed doses Consistent with vitamin K intake.   Review of Systems  HENT: Negative for nosebleeds.   Cardiovascular: Negative for chest pain and palpitations.  Gastrointestinal: Negative for anal bleeding and blood in stool.  Genitourinary: Negative for hematuria.  Hematological: Does not bruise/bleed easily.       Objective:   Physical Exam  Constitutional: She is oriented to person, place, and time. She appears well-developed and well-nourished.  HENT:  Right Ear: External ear normal.  Left Ear: External ear normal.  Cardiovascular: Normal rate, regular rhythm and normal heart sounds.   Pulmonary/Chest: Effort normal and breath sounds normal.  Neurological: She is alert and oriented to person, place, and time.  Skin: Skin is warm and dry.  Psychiatric: She has a normal mood and affect. Her behavior is normal. Judgment and thought content normal.  Vitals reviewed.  BP: 120/78 HR: 70  Wt: 193.4lb INR 2.7       Assessment & Plan:  1.  INR 2-3 2.  Continue Coumadin 5mg  QD except 2.5mg  M/F 3.  INR 1 month

## 2017-03-01 ENCOUNTER — Other Ambulatory Visit: Payer: Medicare Other

## 2017-03-01 DIAGNOSIS — R739 Hyperglycemia, unspecified: Secondary | ICD-10-CM

## 2017-03-01 DIAGNOSIS — Z6837 Body mass index (BMI) 37.0-37.9, adult: Secondary | ICD-10-CM | POA: Diagnosis not present

## 2017-03-01 DIAGNOSIS — E039 Hypothyroidism, unspecified: Secondary | ICD-10-CM | POA: Diagnosis not present

## 2017-03-01 DIAGNOSIS — E6609 Other obesity due to excess calories: Secondary | ICD-10-CM | POA: Diagnosis not present

## 2017-03-01 DIAGNOSIS — IMO0001 Reserved for inherently not codable concepts without codable children: Secondary | ICD-10-CM

## 2017-03-01 LAB — LIPID PANEL
Cholesterol: 186 mg/dL (ref <200)
HDL: 50 mg/dL — ABNORMAL LOW (ref >50)
LDL Cholesterol: 108 mg/dL — ABNORMAL HIGH (ref <100)
Total CHOL/HDL Ratio: 3.7 Ratio (ref <5.0)
Triglycerides: 139 mg/dL (ref <150)
VLDL: 28 mg/dL (ref <30)

## 2017-03-01 LAB — CBC WITH DIFFERENTIAL/PLATELET
Basophils Absolute: 59 cells/uL (ref 0–200)
Basophils Relative: 1 %
Eosinophils Absolute: 354 cells/uL (ref 15–500)
Eosinophils Relative: 6 %
HCT: 41 % (ref 35.0–45.0)
Hemoglobin: 13.2 g/dL (ref 11.7–15.5)
Lymphocytes Relative: 27 %
Lymphs Abs: 1593 cells/uL (ref 850–3900)
MCH: 28.9 pg (ref 27.0–33.0)
MCHC: 32.2 g/dL (ref 32.0–36.0)
MCV: 89.9 fL (ref 80.0–100.0)
MPV: 9.9 fL (ref 7.5–12.5)
Monocytes Absolute: 354 cells/uL (ref 200–950)
Monocytes Relative: 6 %
Neutro Abs: 3540 cells/uL (ref 1500–7800)
Neutrophils Relative %: 60 %
Platelets: 216 10*3/uL (ref 140–400)
RBC: 4.56 MIL/uL (ref 3.80–5.10)
RDW: 13.6 % (ref 11.0–15.0)
WBC: 5.9 10*3/uL (ref 3.8–10.8)

## 2017-03-01 LAB — COMPLETE METABOLIC PANEL WITH GFR
ALT: 9 U/L (ref 6–29)
AST: 19 U/L (ref 10–35)
Albumin: 4 g/dL (ref 3.6–5.1)
Alkaline Phosphatase: 54 U/L (ref 33–130)
BUN: 17 mg/dL (ref 7–25)
CO2: 30 mmol/L (ref 20–31)
Calcium: 9.4 mg/dL (ref 8.6–10.4)
Chloride: 103 mmol/L (ref 98–110)
Creat: 1.02 mg/dL — ABNORMAL HIGH (ref 0.60–0.93)
GFR, Est African American: 60 mL/min (ref >=60)
GFR, Est Non African American: 52 mL/min — ABNORMAL LOW (ref >=60)
Glucose, Bld: 87 mg/dL (ref 65–99)
Potassium: 4.1 mmol/L (ref 3.5–5.3)
Sodium: 141 mmol/L (ref 135–146)
Total Bilirubin: 0.5 mg/dL (ref 0.2–1.2)
Total Protein: 6.4 g/dL (ref 6.1–8.1)

## 2017-03-02 LAB — HEMOGLOBIN A1C
Hgb A1c MFr Bld: 4.9 % (ref <5.7)
Mean Plasma Glucose: 94 mg/dL

## 2017-03-02 LAB — TSH: TSH: 2.58 mIU/L

## 2017-03-05 ENCOUNTER — Encounter: Payer: Self-pay | Admitting: Internal Medicine

## 2017-03-05 ENCOUNTER — Ambulatory Visit (INDEPENDENT_AMBULATORY_CARE_PROVIDER_SITE_OTHER): Payer: Medicare Other | Admitting: Internal Medicine

## 2017-03-05 VITALS — BP 110/60 | HR 69 | Temp 98.6°F | Wt 195.0 lb

## 2017-03-05 DIAGNOSIS — I480 Paroxysmal atrial fibrillation: Secondary | ICD-10-CM | POA: Diagnosis not present

## 2017-03-05 DIAGNOSIS — M7062 Trochanteric bursitis, left hip: Secondary | ICD-10-CM

## 2017-03-05 DIAGNOSIS — E039 Hypothyroidism, unspecified: Secondary | ICD-10-CM

## 2017-03-05 DIAGNOSIS — Z7901 Long term (current) use of anticoagulants: Secondary | ICD-10-CM

## 2017-03-05 DIAGNOSIS — R739 Hyperglycemia, unspecified: Secondary | ICD-10-CM | POA: Diagnosis not present

## 2017-03-05 DIAGNOSIS — F309 Manic episode, unspecified: Secondary | ICD-10-CM | POA: Diagnosis not present

## 2017-03-05 DIAGNOSIS — G894 Chronic pain syndrome: Secondary | ICD-10-CM

## 2017-03-05 MED ORDER — OXYCODONE-ACETAMINOPHEN 10-325 MG PO TABS: ORAL_TABLET | ORAL | 0 refills | Status: DC

## 2017-03-05 NOTE — Progress Notes (Signed)
Location:  [facility] clinic Provider:  Phylisha Dix L. Mariea Clonts, D.O., C.M.D.  Code Status: DNR Goals of Care:  Advanced Directives 03/05/2017  Does Patient Have a Medical Advance Directive? Yes  Type of Advance Directive Out of facility DNR (pink MOST or yellow form)  Does patient want to make changes to medical advance directive? -  Copy of [place] in Chart? -  Would patient like information on creating a medical advance directive? -  Pre-existing out of facility DNR order (yellow form or pink MOST form) -   Chief Complaint  Patient presents with  . Medical Management of Chronic Issues    86mth follow-up    HPI: Patient is a 82 y.o. female seen today for medical management of chronic diseases.    Back is bad these days.  Pain medication works well.  Continues to walk with cane.  Bothersome when she goes to bed and if she's been up a while.  Takes an average of 2 pain pills per day(percocet), sometimes less.  Uses mobic as needed.    Still saving the prednisone.  Has not taken it for the hip.  Spirits are good on current meds as below. Follows with psychiatry and psychology.  Sleeping well with her trazodone and remeron.  Appetite back to normal.  Overactive bladder had some improvement with myrbetriq, but still incontinent of urine at times.    Reviewed labs.  All good.  hba1c normal.    Sees [name] Miller for INR mgt for afib.  Not on rate control meds and no problems with RVR.  Past Medical History:  Diagnosis Date  . Anemia   . Anxiety   . Arthritis    hands, spine   . Blood transfusion   . Cervicalgia   . Constipation   . Cough    Wert-onset 08/2009, as of 2014- resolved   . Deep venous thrombosis ([place])    post op, rec'd/needed  blood thinner   . Depression   . Depression with anxiety   . Dysrhythmia   . Falls frequently    pt. reports that she was falling 3 times a day, has had PT at a facility for a while & now is getting home PT 2-3 times/ week   .  Headache(784.0) 09/27/2011  . History of stress test    15-20 yrs.ago   . Hypotension   . Hypothyroidism   . Lumbar radiculopathy 09/26/2011  . Pneumonia    never been in hosp. for pneumonia   . Prediabetes   . Right hand fracture    Monday  . Shortness of breath   . Sleep apnea    doesn't use CPAP any longer, since weight loss    Past Surgical History:  Procedure Laterality Date  . ANTERIOR CERVICAL DECOMP/DISCECTOMY FUSION N/A 07/09/2013   Procedure: ANTERIOR CERVICAL DECOMPRESSION/DISCECTOMY FUSION 3 LEVELS;  Surgeon: Sinclair Ship, MD;  Location: [facility];  Service: Orthopedics;  Laterality: N/A;  Anterior cervical decompression fusion, cervical 3-4, cervical 4-5-, cervical 5-6 with instrumentation, allograft.  . APPENDECTOMY    . BACK SURGERY     2000  . BREAST EXCISIONAL BIOPSY     breast bxs on left x 2, breast bx of right x 1-all benign  . EYE SURGERY     cataracts bilateral /w IOL  . HERNIA REPAIR     umbilical hernia- 1761  . JOINT REPLACEMENT  2006,2007   bilateral  . LAPAROSCOPIC OVARIAN CYSTECTOMY     not  done by laparscopy-abdominal incision  . REPLACEMENT TOTAL KNEE BILATERAL    . TONSILLECTOMY      Allergies  Allergen Reactions  . Clindamycin Hcl Other (See Comments)    REACTION: swelling, pt. Reports that she had a 16 lb. weightgain in one day    Allergies as of 03/05/2017      Reactions   Clindamycin Hcl Other (See Comments)   REACTION: swelling, pt. Reports that she had a 16 lb. weightgain in one day      Medication List       Accurate as of 03/05/17 11:44 AM. Always use your most recent med list.          Brexpiprazole 1 MG Tabs Take 1 tablet by mouth at bedtime.   busPIRone 15 MG tablet Commonly known as:  BUSPAR Take 15 mg by mouth daily.   CALCIUM CITRATE PO Take 600 mg by mouth 2 (two) times daily.   clonazePAM 0.5 MG tablet Commonly known as:  KLONOPIN Take 1 tablet by mouth as needed in middle of the nite to go back to  sleep   DULoxetine 60 MG capsule Commonly known as:  CYMBALTA Take 2 capsules (120 mg total) by mouth daily.   furosemide 40 MG tablet Commonly known as:  LASIX 1/2 by mouth daily   levothyroxine 50 MCG tablet Commonly known as:  SYNTHROID, LEVOTHROID Take one tablet by mouth once daily 30 minutes before breakfast for thyroid   meloxicam 15 MG tablet Commonly known as:  MOBIC TAKE 1 TABLET EVERY DAY   mirtazapine 15 MG tablet Commonly known as:  REMERON Take 7.5 mg by mouth at bedtime.   MULTIVITAMIN PO Take 1 tablet by mouth.   MYRBETRIQ 25 MG Tb24 tablet Generic drug:  mirabegron ER Take 25 mg by mouth daily.   oxyCODONE-acetaminophen 10-325 MG tablet Commonly known as:  PERCOCET Take 1-2 tablets by mouth every 4-6 hours as needed for Arthritis pain   predniSONE 5 MG (21) Tbpk tablet Commonly known as:  STERAPRED UNI-PAK 21 TAB Take as directed if bursitis flares up   RESTASIS 0.05 % ophthalmic emulsion Generic drug:  cycloSPORINE PLACE 1 DROP INTO BOTH EYES TWICE A DAY   traZODone 100 MG tablet Commonly known as:  DESYREL Take 2 tablets by mouth at bedtime   warfarin 5 MG tablet Commonly known as:  COUMADIN TAKE 1 TABLET BY MOUTH ONCE DAILY EXCEPT 1/2 TABLET ON FRIDAYS AS DIRECTED       Review of Systems:  Review of Systems  Constitutional: Negative for chills, fever and malaise/fatigue.  HENT: Positive for hearing loss. Negative for nosebleeds.   Eyes: Negative for blurred vision.       Glasses  Respiratory: Negative for cough and shortness of breath.   Cardiovascular: Negative for chest pain and leg swelling.  Gastrointestinal: Negative for abdominal pain, blood in stool, constipation, diarrhea and melena.  Genitourinary: Negative for dysuria.  Musculoskeletal: Positive for back pain and joint pain. Negative for falls.       Walks with cane  Skin: Negative for itching and rash.  Neurological: Negative for dizziness and weakness.    Psychiatric/Behavioral: Positive for depression and memory loss. The patient is nervous/anxious.     Health Maintenance  Topic Date Due  . INFLUENZA VACCINE  06/20/2017  . URINE MICROALBUMIN  11/27/2017  . TETANUS/TDAP  11/27/2026  . DEXA SCAN  Completed  . PNA vac Low Risk Adult  Completed    Physical Exam: Vitals:  03/05/17 1115  BP: 110/60  Pulse: 69  Temp: 98.6 F (37 C)  TempSrc: Oral  SpO2: 96%  Weight: 195 lb (88.5 kg)   Body mass index is 36.84 kg/m. Physical Exam  Constitutional: She is oriented to person, place, and time. She appears well-developed and well-nourished. No distress.  HENT:  Hearing aids  Eyes:  glasses  Cardiovascular: Intact distal pulses.   irreg irreg  Pulmonary/Chest: Effort normal and breath sounds normal. No respiratory distress.  Abdominal: Soft. Bowel sounds are normal.  Musculoskeletal: Normal range of motion.  Ambulates slowly with cane  Neurological: She is alert and oriented to person, place, and time.  Skin: Skin is warm and dry.  Psychiatric: She has a normal mood and affect.    Labs reviewed: Basic Metabolic Panel:  Recent Labs  04/24/16 0928 08/14/16 03/01/17 0820  NA 144 139 141  K 4.2 4.5 4.1  CL 101  --  103  CO2 24  --  30  GLUCOSE 99  --  87  BUN 20 28* 17  CREATININE 0.80 0.9 1.02*  CALCIUM 9.6  --  9.4  TSH  --  3.11 2.58   Liver Function Tests:  Recent Labs  08/14/16 03/01/17 0820  AST 17 19  ALT 8 9  ALKPHOS 52 54  BILITOT  --  0.5  PROT  --  6.4  ALBUMIN  --  4.0   No results for input(s): LIPASE, AMYLASE in the last 8760 hours. No results for input(s): AMMONIA in the last 8760 hours. CBC:  Recent Labs  08/14/16 03/01/17 0820  WBC 6.1 5.9  NEUTROABS  --  3,540  HGB 12.6 13.2  HCT 38 41.0  MCV  --  89.9  PLT 194 216   Lipid Panel:  Recent Labs  04/24/16 0928 03/01/17 0820  CHOL 171 186  HDL 58 50*  LDLCALC 90 108*  TRIG 117 139  CHOLHDL 2.9 3.7   Lab Results   Component Value Date   HGBA1C 4.9 03/01/2017   Heel scan was fine at [facility]:  Normal.    Assessment/Plan 1. Chronic pain syndrome -cont mobic and percocet for back pain and left hip pain -monitor renal function and cbc - oxyCODONE-acetaminophen (PERCOCET) 10-325 MG tablet; Take 1-2 tablets by mouth every 4-6 hours as needed for Arthritis pain  Dispense: 180 tablet; Refill: 0  2. Greater trochanteric bursitis of left hip -has prednisone if this gets bad, but has not had to use and seems to be doing better  3. Adult hypothyroidism -cont current levothyroxine and f/u tsh annually  4. Bipolar I disorder, single manic episode ([place]) -stable with current regimen as per psychiatry  5. Paroxysmal A-fib ([facility]) -rate controlled w/o meds, continue on coumadin with INR goal 2-3  6. Chronic anticoagulation -cont coumadin managed here by Dr. Sabra Heck, INR goal 2-3  7. Hyperglycemia -last 2 hba1cs in normal range, cont to monitor and work on diet, can only exercise minimally with her back pain and now hip pain  Labs/tests ordered:  No orders of the defined types were placed in this encounter.  Next appt:  03/19/2017   Yeila Morro L. Kasin Tonkinson, D.O. [facility] [address], [place] [address] Cell Phone (Mon-Fri 8am-5pm):  [phone] On Call:  [phone] & follow prompts after 5pm & weekends Office Phone:  [phone] Office Fax:  [phone]

## 2017-03-09 ENCOUNTER — Encounter: Payer: Self-pay | Admitting: Internal Medicine

## 2017-03-10 ENCOUNTER — Other Ambulatory Visit: Payer: Self-pay | Admitting: Internal Medicine

## 2017-03-10 DIAGNOSIS — I82409 Acute embolism and thrombosis of unspecified deep veins of unspecified lower extremity: Secondary | ICD-10-CM

## 2017-03-19 ENCOUNTER — Encounter: Payer: Self-pay | Admitting: Pharmacotherapy

## 2017-03-19 ENCOUNTER — Ambulatory Visit (INDEPENDENT_AMBULATORY_CARE_PROVIDER_SITE_OTHER): Payer: Medicare Other | Admitting: Pharmacotherapy

## 2017-03-19 VITALS — BP 130/80 | HR 60 | Temp 98.2°F | Wt 195.0 lb

## 2017-03-19 DIAGNOSIS — Z7901 Long term (current) use of anticoagulants: Secondary | ICD-10-CM

## 2017-03-19 DIAGNOSIS — I480 Paroxysmal atrial fibrillation: Secondary | ICD-10-CM | POA: Diagnosis not present

## 2017-03-19 LAB — POCT INR: INR: 2.8

## 2017-03-19 NOTE — Progress Notes (Signed)
   Subjective:    Patient ID: Andrea [name], female    DOB: [birth year], 82 y.o.   MRN: [id number]  HPI Last INR on 02/19/17 was OK at 2.7 Current coumadin dose is 5mg  QD except 2.5mg  M/F. Denies missed doses. Denies any unusual bleeding or bruising. Denies CP, palpitations, falls Consistent with vitamin K intake.   Review of Systems  HENT: Negative for nosebleeds.   Cardiovascular: Negative for chest pain and palpitations.  Gastrointestinal: Negative for anal bleeding and blood in stool.  Genitourinary: Negative for hematuria.  Hematological: Does not bruise/bleed easily.       Objective:   Physical Exam  Constitutional: She is oriented to person, place, and time. She appears well-developed and well-nourished.  HENT:  Right Ear: External ear normal.  Left Ear: External ear normal.  Cardiovascular: Normal rate, regular rhythm and normal heart sounds.   Pulmonary/Chest: Effort normal and breath sounds normal.  Neurological: She is alert and oriented to person, place, and time.  Skin: Skin is warm and dry.  Psychiatric: She has a normal mood and affect. Her behavior is normal. Judgment and thought content normal.  Vitals reviewed.   BP: 130/80  HR: 60  Wt: 195lb INR 2.8      Assessment & Plan:  1.  INR at goal 2-3 2.  Continue Coumadin 5mg  QD except 2.5mg  M/F 3.  RTC 1 month

## 2017-03-19 NOTE — Patient Instructions (Signed)
INR 2.8 Continue Coumadin 5mg  daily except 2.5mg  (1/2 tablet) on Mondays and Fridays

## 2017-04-04 ENCOUNTER — Other Ambulatory Visit: Payer: Self-pay | Admitting: *Deleted

## 2017-04-04 MED ORDER — WARFARIN SODIUM 5 MG PO TABS: ORAL_TABLET | ORAL | 3 refills | Status: DC

## 2017-04-04 NOTE — Telephone Encounter (Signed)
[facility] 

## 2017-04-05 ENCOUNTER — Ambulatory Visit (INDEPENDENT_AMBULATORY_CARE_PROVIDER_SITE_OTHER): Payer: Medicare Other | Admitting: Nurse Practitioner

## 2017-04-05 ENCOUNTER — Encounter: Payer: Self-pay | Admitting: Nurse Practitioner

## 2017-04-05 VITALS — BP 124/74 | HR 80 | Temp 98.7°F | Resp 18 | Ht 61.0 in | Wt 192.0 lb

## 2017-04-05 DIAGNOSIS — M7062 Trochanteric bursitis, left hip: Secondary | ICD-10-CM

## 2017-04-05 DIAGNOSIS — W19XXXA Unspecified fall, initial encounter: Secondary | ICD-10-CM | POA: Diagnosis not present

## 2017-04-05 NOTE — Progress Notes (Signed)
Careteam: Patient Care Team: Gayland Curry, DO as PCP - General (Geriatric Medicine) Tanda Rockers, MD as Consulting Physician (Pulmonary Disease)  Advanced Directive information Does Patient Have a Medical Advance Directive?: Yes, Type of Advance Directive: Out of facility DNR (pink MOST or yellow form), Pre-existing out of facility DNR order (yellow form or pink MOST form): Yellow form placed in chart (order not valid for inpatient use)  Allergies  Allergen Reactions  . Clindamycin Hcl Other (See Comments)    REACTION: swelling, pt. Reports that she had a 16 lb. weightgain in one day    Chief Complaint  Patient presents with  . Acute Visit    Pt is being seen due to left hip pain. Pt recently diagnosed with bursitis by Dr. Mariea Clonts and was given prednisone that she is currently taking for flare.      HPI: Patient is a 82 y.o. female seen in the office today to follow up bursitis.  Pt saw Dr Mariea Clonts on 03/05/17 and was diagnosised with greater trochanteric bursitis of the left him. Rx given if symptoms got worse. She started taking medication 4 days ago and pain has improved significantly. Now the pain is only an ache unless she presses on area. She has completed prednisone course.  Gait has cont to worsen. Slow steps and weakness noted. Daughter noticed this a year ago and PT helped. Bursitis has worsen the problems. Had a fall 2 weeks ago. Feels like she tripped over her own feet. Wears a necklace to call for help. Has a hx of falls and this is not a new problem.  Looking into moving in with her daughter.  Review of Systems:  Review of Systems  Constitutional: Negative for chills, fever and malaise/fatigue.  HENT: Positive for hearing loss. Negative for nosebleeds.   Eyes: Negative for blurred vision.       Glasses  Respiratory: Negative for cough and shortness of breath.   Cardiovascular: Negative for chest pain and leg swelling.  Genitourinary: Negative for dysuria.   Overactive bladder  Musculoskeletal: Positive for back pain and joint pain. Negative for falls.       Walks with cane Pain in hip much better  Skin: Negative for itching and rash.  Neurological: Negative for dizziness and weakness.  Psychiatric/Behavioral: Positive for memory loss.   Review of Systems  Constitutional: Negative for chills, fever and malaise/fatigue.  HENT: Positive for hearing loss. Negative for nosebleeds.   Eyes: Negative for blurred vision.       Glasses  Respiratory: Negative for cough and shortness of breath.   Cardiovascular: Negative for chest pain and leg swelling.  Genitourinary: Negative for dysuria.       Overactive bladder  Musculoskeletal: Positive for back pain and joint pain. Negative for falls.       Walks with cane Pain in hip much better  Skin: Negative for itching and rash.  Neurological: Negative for dizziness and weakness.  Psychiatric/Behavioral: Positive for memory loss.   Past Medical History:  Diagnosis Date  . Anemia   . Anxiety   . Arthritis    hands, spine   . Blood transfusion   . Cervicalgia   . Constipation   . Cough    Wert-onset 08/2009, as of 2014- resolved   . Deep venous thrombosis ([place])    post op, rec'd/needed  blood thinner   . Depression   . Depression with anxiety   . Dysrhythmia   . Falls frequently  pt. reports that she was falling 3 times a day, has had PT at a facility for a while & now is getting home PT 2-3 times/ week   . Headache(784.0) 09/27/2011  . History of stress test    15-20 yrs.ago   . Hypotension   . Hypothyroidism   . Lumbar radiculopathy 09/26/2011  . Pneumonia    never been in hosp. for pneumonia   . Prediabetes   . Right hand fracture    Monday  . Shortness of breath   . Sleep apnea    doesn't use CPAP any longer, since weight loss   Past Surgical History:  Procedure Laterality Date  . ANTERIOR CERVICAL DECOMP/DISCECTOMY FUSION N/A 07/09/2013   Procedure: ANTERIOR CERVICAL  DECOMPRESSION/DISCECTOMY FUSION 3 LEVELS;  Surgeon: Sinclair Ship, MD;  Location: [place];  Service: Orthopedics;  Laterality: N/A;  Anterior cervical decompression fusion, cervical 3-4, cervical 4-5-, cervical 5-6 with instrumentation, allograft.  . APPENDECTOMY    . BACK SURGERY     2000  . BREAST EXCISIONAL BIOPSY     breast bxs on left x 2, breast bx of right x 1-all benign  . EYE SURGERY     cataracts bilateral /w IOL  . HERNIA REPAIR     umbilical hernia- [id number]  . JOINT REPLACEMENT  2006,2007   bilateral  . LAPAROSCOPIC OVARIAN CYSTECTOMY     not done by laparscopy-abdominal incision  . REPLACEMENT TOTAL KNEE BILATERAL    . TONSILLECTOMY     Social History:   reports that she quit smoking about 48 years ago. Her smoking use included Cigarettes. She has a 10.00 pack-year smoking history. She has never used smokeless tobacco. She reports that she drinks about 0.6 oz of alcohol per week . She reports that she does not use drugs.  Family History  Problem Relation Age of Onset  . Heart disease Father   . Malignant hyperthermia Father   . Arthritis Father   . Deep vein thrombosis Son   . Diabetes Son   . Obesity Son   . Depression Son   . Hypertension Son   . Parkinson's disease Mother   . Diabetes Daughter   . Hypertension Daughter   . Anxiety disorder Daughter   . Hyperlipidemia Son   . Hypertension Son   . Cancer Maternal Aunt     Medications: Patient's Medications  New Prescriptions   No medications on file  Previous Medications   BREXPIPRAZOLE 1 MG TABS    Take 1 tablet by mouth at bedtime.   BUSPIRONE (BUSPAR) 15 MG TABLET    Take 15 mg by mouth daily.   CALCIUM CITRATE PO    Take 600 mg by mouth 2 (two) times daily.   CLONAZEPAM (KLONOPIN) 0.5 MG TABLET    Take 1 tablet by mouth as needed in middle of the nite to go back to sleep   DULOXETINE (CYMBALTA) 60 MG CAPSULE    Take 2 capsules (120 mg total) by mouth daily.   FUROSEMIDE (LASIX) 40 MG TABLET    1/2  by mouth daily   LEVOTHYROXINE (SYNTHROID, LEVOTHROID) 50 MCG TABLET    Take one tablet by mouth once daily 30 minutes before breakfast for thyroid   MELOXICAM (MOBIC) 15 MG TABLET    TAKE 1 TABLET EVERY DAY   MIRABEGRON ER (MYRBETRIQ) 25 MG TB24 TABLET    Take 25 mg by mouth daily.   MIRTAZAPINE (REMERON) 15 MG TABLET    Take 7.5 mg  by mouth at bedtime.   MULTIPLE VITAMINS-MINERALS (MULTIVITAMIN PO)    Take 1 tablet by mouth.   OXYCODONE-ACETAMINOPHEN (PERCOCET) 10-325 MG TABLET    Take 1-2 tablets by mouth every 4-6 hours as needed for Arthritis pain   PREDNISONE (STERAPRED UNI-PAK 21 TAB) 5 MG (21) TBPK TABLET    Take as directed if bursitis flares up   RESTASIS 0.05 % OPHTHALMIC EMULSION    PLACE 1 DROP INTO BOTH EYES TWICE A DAY   TRAZODONE (DESYREL) 100 MG TABLET    Take 2 tablets by mouth at bedtime   WARFARIN (COUMADIN) 5 MG TABLET    Take one tablet daily except 1/2 tablet on Monday and Friday as directed.  Modified Medications   No medications on file  Discontinued Medications   No medications on file     Physical Exam:  Vitals:   04/05/17 0839  BP: 124/74  Pulse: 80  Resp: 18  Temp: 98.7 F (37.1 C)  TempSrc: Oral  SpO2: 98%  Weight: 192 lb (87.1 kg)  Height: 5\' 1"  (1.549 m)   Body mass index is 36.28 kg/m.  Physical Exam  Constitutional: She is oriented to person, place, and time. She appears well-developed and well-nourished. No distress.  HENT:  Hearing aids  Eyes:  glasses  Cardiovascular: Normal rate and normal heart sounds.  An irregularly irregular rhythm present.  irreg irreg  Pulmonary/Chest: Effort normal and breath sounds normal. No respiratory distress.  Abdominal: Soft. Bowel sounds are normal.  Musculoskeletal: Normal range of motion.  Ambulates slowly with cane Left bursa tender  Lumbar spine tender  Neurological: She is alert and oriented to person, place, and time.  Skin: Skin is warm and dry.  Psychiatric: She has a normal mood and  affect.    Labs reviewed: Basic Metabolic Panel:  Recent Labs  04/24/16 0928 08/14/16 03/01/17 0820  NA 144 139 141  K 4.2 4.5 4.1  CL 101  --  103  CO2 24  --  30  GLUCOSE 99  --  87  BUN 20 28* 17  CREATININE 0.80 0.9 1.02*  CALCIUM 9.6  --  9.4  TSH  --  3.11 2.58   Liver Function Tests:  Recent Labs  08/14/16 03/01/17 0820  AST 17 19  ALT 8 9  ALKPHOS 52 54  BILITOT  --  0.5  PROT  --  6.4  ALBUMIN  --  4.0   No results for input(s): LIPASE, AMYLASE in the last 8760 hours. No results for input(s): AMMONIA in the last 8760 hours. CBC:  Recent Labs  08/14/16 03/01/17 0820  WBC 6.1 5.9  NEUTROABS  --  3,540  HGB 12.6 13.2  HCT 38 41.0  MCV  --  89.9  PLT 194 216   Lipid Panel:  Recent Labs  04/24/16 0928 03/01/17 0820  CHOL 171 186  HDL 58 50*  LDLCALC 90 108*  TRIG 117 139  CHOLHDL 2.9 3.7   TSH:  Recent Labs  08/14/16 03/01/17 0820  TSH 3.11 2.58   A1C: Lab Results  Component Value Date   HGBA1C 4.9 03/01/2017      Assessment/Plan 1. Greater trochanteric bursitis of left hip -improved on prednisone dose pack but more debility and weakness due to immobility with pain - Ambulatory referral to Physical Therapy  2. Fall, initial encounter -multiple falls at home, has life alert necklace. Plans in place to move her in with daughter  - Ambulatory referral to Physical Therapy  Carlos American. Harle Battiest  [facility] [phone] 8 am - 5 pm) [phone] (after hours)

## 2017-04-05 NOTE — Patient Instructions (Signed)
Bursitis Bursitis is inflammation and irritation of a bursa, which is one of the small, fluid-filled sacs that cushion and protect the moving parts of your body. These sacs are located between bones and muscles, muscle attachments, or skin areas next to bones. A bursa protects these structures from the wear and tear that results from frequent movement. An inflamed bursa causes pain and swelling. Fluid may build up inside the sac. Bursitis is most common near joints, especially the knees, elbows, hips, and shoulders. What are the causes? Bursitis can be caused by:  Injury from:  A direct blow, like falling on your knee or elbow.  Overuse of a joint (repetitive stress).  Infection. This can happen if bacteria gets into a bursa through a cut or scrape near a joint.  Diseases that cause joint inflammation, such as gout and rheumatoid arthritis. What increases the risk? You may be at risk for bursitis if you:  Have a job or hobby that involves a lot of repetitive stress on your joints.  Have a condition that weakens your body's defense system (immune system), such as diabetes, cancer, or HIV.  Lift and reach overhead often.  Kneel or lean on hard surfaces often.  Run or walk often. What are the signs or symptoms? The most common signs and symptoms of bursitis are:  Pain that gets worse when you move the affected body part or put weight on it.  Inflammation.  Stiffness. Other signs and symptoms may include:  Redness.  Tenderness.  Warmth.  Pain that continues after rest.  Fever and chills. This may occur in bursitis caused by infection. How is this diagnosed? Bursitis may be diagnosed by:  Medical history and physical exam.  MRI.  A procedure to drain fluid from the bursa with a needle (aspiration). The fluid may be checked for signs of infection or gout.  Blood tests to rule out other causes of inflammation. How is this treated? Bursitis can usually be treated at  home with rest, ice, compression, and elevation (RICE). For mild bursitis, RICE treatment may be all you need. Other treatments may include:  Nonsteroidal anti-inflammatory drugs (NSAIDs) to treat pain and inflammation.  Corticosteroids to fight inflammation. You may have these drugs injected into and around the area of bursitis.  Aspiration of bursitis fluid to relieve pain and improve movement.  Antibiotic medicine to treat an infected bursa.  A splint, brace, or walking aid.  Physical therapy if you continue to have pain or limited movement.  Surgery to remove a damaged or infected bursa. This may be needed if you have a very bad case of bursitis or if other treatments have not worked. Follow these instructions at home:  Take medicines only as directed by your health care provider.  If you were prescribed an antibiotic medicine, finish it all even if you start to feel better.  Rest the affected area as directed by your health care provider.  Keep the area elevated.  Avoid activities that make pain worse.  Apply ice to the injured area:  Place ice in a plastic bag.  Place a towel between your skin and the bag.  Leave the ice on for 20 minutes, 2-3 times a day.  Use splints, braces, pads, or walking aids as directed by your health care provider.  Keep all follow-up visits as directed by your health care provider. This is important. How is this prevented?  Wear knee pads if you kneel often.  Wear sturdy running or walking shoes  that fit you well.  Take regular breaks from repetitive activity.  Warm up by stretching before doing any strenuous activity.  Maintain a healthy weight or lose weight as recommended by your health care provider. Ask your health care provider if you need help.  Exercise regularly. Start any new physical activity gradually. Contact a health care provider if:  Your bursitis is not responding to treatment or home care.  You have a  fever.  You have chills. This information is not intended to replace advice given to you by your health care provider. Make sure you discuss any questions you have with your health care provider. Document Released: 11/03/2000 Document Revised: 04/13/2016 Document Reviewed: 01/26/2014 Elsevier Interactive Patient Education  2017 [employer].

## 2017-04-18 ENCOUNTER — Other Ambulatory Visit: Payer: Self-pay | Admitting: Internal Medicine

## 2017-04-19 DIAGNOSIS — M7072 Other bursitis of hip, left hip: Secondary | ICD-10-CM | POA: Diagnosis not present

## 2017-04-19 DIAGNOSIS — R296 Repeated falls: Secondary | ICD-10-CM | POA: Diagnosis not present

## 2017-04-19 DIAGNOSIS — Z9181 History of falling: Secondary | ICD-10-CM | POA: Diagnosis not present

## 2017-04-23 ENCOUNTER — Ambulatory Visit: Payer: Medicare Other | Admitting: Pharmacotherapy

## 2017-04-24 DIAGNOSIS — M7072 Other bursitis of hip, left hip: Secondary | ICD-10-CM | POA: Diagnosis not present

## 2017-04-24 DIAGNOSIS — R296 Repeated falls: Secondary | ICD-10-CM | POA: Diagnosis not present

## 2017-04-24 DIAGNOSIS — Z9181 History of falling: Secondary | ICD-10-CM | POA: Diagnosis not present

## 2017-04-26 DIAGNOSIS — R296 Repeated falls: Secondary | ICD-10-CM | POA: Diagnosis not present

## 2017-04-26 DIAGNOSIS — Z9181 History of falling: Secondary | ICD-10-CM | POA: Diagnosis not present

## 2017-04-26 DIAGNOSIS — M7072 Other bursitis of hip, left hip: Secondary | ICD-10-CM | POA: Diagnosis not present

## 2017-04-30 ENCOUNTER — Ambulatory Visit: Payer: Medicare Other | Admitting: Pharmacotherapy

## 2017-04-30 DIAGNOSIS — M7072 Other bursitis of hip, left hip: Secondary | ICD-10-CM | POA: Diagnosis not present

## 2017-04-30 DIAGNOSIS — Z9181 History of falling: Secondary | ICD-10-CM | POA: Diagnosis not present

## 2017-04-30 DIAGNOSIS — R296 Repeated falls: Secondary | ICD-10-CM | POA: Diagnosis not present

## 2017-05-03 DIAGNOSIS — M7072 Other bursitis of hip, left hip: Secondary | ICD-10-CM | POA: Diagnosis not present

## 2017-05-03 DIAGNOSIS — Z9181 History of falling: Secondary | ICD-10-CM | POA: Diagnosis not present

## 2017-05-03 DIAGNOSIS — R296 Repeated falls: Secondary | ICD-10-CM | POA: Diagnosis not present

## 2017-05-07 DIAGNOSIS — M7072 Other bursitis of hip, left hip: Secondary | ICD-10-CM | POA: Diagnosis not present

## 2017-05-07 DIAGNOSIS — R296 Repeated falls: Secondary | ICD-10-CM | POA: Diagnosis not present

## 2017-05-07 DIAGNOSIS — Z9181 History of falling: Secondary | ICD-10-CM | POA: Diagnosis not present

## 2017-05-10 DIAGNOSIS — R296 Repeated falls: Secondary | ICD-10-CM | POA: Diagnosis not present

## 2017-05-10 DIAGNOSIS — M7072 Other bursitis of hip, left hip: Secondary | ICD-10-CM | POA: Diagnosis not present

## 2017-05-10 DIAGNOSIS — Z9181 History of falling: Secondary | ICD-10-CM | POA: Diagnosis not present

## 2017-05-14 DIAGNOSIS — R296 Repeated falls: Secondary | ICD-10-CM | POA: Diagnosis not present

## 2017-05-14 DIAGNOSIS — M7072 Other bursitis of hip, left hip: Secondary | ICD-10-CM | POA: Diagnosis not present

## 2017-05-14 DIAGNOSIS — Z9181 History of falling: Secondary | ICD-10-CM | POA: Diagnosis not present

## 2017-05-17 DIAGNOSIS — M7072 Other bursitis of hip, left hip: Secondary | ICD-10-CM | POA: Diagnosis not present

## 2017-05-17 DIAGNOSIS — Z9181 History of falling: Secondary | ICD-10-CM | POA: Diagnosis not present

## 2017-05-17 DIAGNOSIS — R296 Repeated falls: Secondary | ICD-10-CM | POA: Diagnosis not present

## 2017-05-21 ENCOUNTER — Encounter: Payer: Self-pay | Admitting: Pharmacotherapy

## 2017-05-21 ENCOUNTER — Ambulatory Visit (INDEPENDENT_AMBULATORY_CARE_PROVIDER_SITE_OTHER): Payer: Medicare Other | Admitting: Pharmacotherapy

## 2017-05-21 VITALS — BP 142/84 | HR 70 | Temp 98.9°F | Ht 61.0 in | Wt 192.4 lb

## 2017-05-21 DIAGNOSIS — M7072 Other bursitis of hip, left hip: Secondary | ICD-10-CM | POA: Diagnosis not present

## 2017-05-21 DIAGNOSIS — Z9181 History of falling: Secondary | ICD-10-CM | POA: Diagnosis not present

## 2017-05-21 DIAGNOSIS — Z7901 Long term (current) use of anticoagulants: Secondary | ICD-10-CM | POA: Diagnosis not present

## 2017-05-21 DIAGNOSIS — I480 Paroxysmal atrial fibrillation: Secondary | ICD-10-CM

## 2017-05-21 DIAGNOSIS — R296 Repeated falls: Secondary | ICD-10-CM | POA: Diagnosis not present

## 2017-05-21 LAB — POCT INR: INR: 2.5

## 2017-05-21 NOTE — Progress Notes (Signed)
   Subjective:    Patient ID: Julitza [name], female    DOB: 09-26-1935, 81 y.o.   MRN: [id number]  HPI Last INR on 03/19/17 was OK at 2.8 Current Coumadin dose is 5mg  QD except 2.5mg  M/F Denies missed doses. Denies unusual bleeding or bruising. Denies CP, palpitations, falls Consistent with vitamin K.   Review of Systems  HENT: Negative for nosebleeds.   Cardiovascular: Negative for chest pain and palpitations.  Gastrointestinal: Negative for anal bleeding and blood in stool.  Genitourinary: Negative for hematuria.  Hematological: Does not bruise/bleed easily.       Objective:   Physical Exam  Constitutional: She is oriented to person, place, and time. She appears well-developed and well-nourished.  HENT:  Right Ear: External ear normal.  Left Ear: External ear normal.  Cardiovascular: Normal rate, regular rhythm and normal heart sounds.   Pulmonary/Chest: Effort normal and breath sounds normal.  Neurological: She is alert and oriented to person, place, and time.  Skin: Skin is warm and dry.  Psychiatric: She has a normal mood and affect. Her behavior is normal. Judgment and thought content normal.  Vitals reviewed.   BP: 142/84  HR: 70  Wt: 192lb INR 2.5      Assessment & Plan:  1.  INR at goal 2-3 2.  Continue Coumadin 5mg  QD except 2.5mg  M/F 3.  RTC in 1 month

## 2017-05-21 NOTE — Patient Instructions (Signed)
INR 2.5  Continue Coumadin 5mg  (1 tablet) daily except 2.5mg  (1/2 tablet) on Mondays and Fridays

## 2017-05-24 DIAGNOSIS — M7072 Other bursitis of hip, left hip: Secondary | ICD-10-CM | POA: Diagnosis not present

## 2017-05-24 DIAGNOSIS — Z9181 History of falling: Secondary | ICD-10-CM | POA: Diagnosis not present

## 2017-05-24 DIAGNOSIS — R296 Repeated falls: Secondary | ICD-10-CM | POA: Diagnosis not present

## 2017-05-28 DIAGNOSIS — Z9181 History of falling: Secondary | ICD-10-CM | POA: Diagnosis not present

## 2017-05-28 DIAGNOSIS — M7072 Other bursitis of hip, left hip: Secondary | ICD-10-CM | POA: Diagnosis not present

## 2017-05-28 DIAGNOSIS — R296 Repeated falls: Secondary | ICD-10-CM | POA: Diagnosis not present

## 2017-05-30 DIAGNOSIS — Z9181 History of falling: Secondary | ICD-10-CM | POA: Diagnosis not present

## 2017-05-30 DIAGNOSIS — R296 Repeated falls: Secondary | ICD-10-CM | POA: Diagnosis not present

## 2017-05-30 DIAGNOSIS — M7072 Other bursitis of hip, left hip: Secondary | ICD-10-CM | POA: Diagnosis not present

## 2017-06-02 ENCOUNTER — Other Ambulatory Visit: Payer: Self-pay | Admitting: Internal Medicine

## 2017-06-04 ENCOUNTER — Ambulatory Visit: Payer: Medicare Other | Admitting: Internal Medicine

## 2017-06-04 DIAGNOSIS — R296 Repeated falls: Secondary | ICD-10-CM | POA: Diagnosis not present

## 2017-06-04 DIAGNOSIS — M7072 Other bursitis of hip, left hip: Secondary | ICD-10-CM | POA: Diagnosis not present

## 2017-06-04 DIAGNOSIS — Z9181 History of falling: Secondary | ICD-10-CM | POA: Diagnosis not present

## 2017-06-06 DIAGNOSIS — Z9181 History of falling: Secondary | ICD-10-CM | POA: Diagnosis not present

## 2017-06-06 DIAGNOSIS — M7072 Other bursitis of hip, left hip: Secondary | ICD-10-CM | POA: Diagnosis not present

## 2017-06-06 DIAGNOSIS — R296 Repeated falls: Secondary | ICD-10-CM | POA: Diagnosis not present

## 2017-06-11 ENCOUNTER — Encounter: Payer: Self-pay | Admitting: Internal Medicine

## 2017-06-11 ENCOUNTER — Ambulatory Visit (INDEPENDENT_AMBULATORY_CARE_PROVIDER_SITE_OTHER): Payer: Medicare Other | Admitting: Internal Medicine

## 2017-06-11 VITALS — BP 132/82 | HR 80 | Temp 98.2°F | Wt 191.0 lb

## 2017-06-11 DIAGNOSIS — W19XXXA Unspecified fall, initial encounter: Secondary | ICD-10-CM

## 2017-06-11 DIAGNOSIS — Z6837 Body mass index (BMI) 37.0-37.9, adult: Secondary | ICD-10-CM

## 2017-06-11 DIAGNOSIS — Z9181 History of falling: Secondary | ICD-10-CM | POA: Diagnosis not present

## 2017-06-11 DIAGNOSIS — E1169 Type 2 diabetes mellitus with other specified complication: Secondary | ICD-10-CM | POA: Diagnosis not present

## 2017-06-11 DIAGNOSIS — F309 Manic episode, unspecified: Secondary | ICD-10-CM

## 2017-06-11 DIAGNOSIS — E039 Hypothyroidism, unspecified: Secondary | ICD-10-CM | POA: Diagnosis not present

## 2017-06-11 DIAGNOSIS — IMO0001 Reserved for inherently not codable concepts without codable children: Secondary | ICD-10-CM

## 2017-06-11 DIAGNOSIS — E785 Hyperlipidemia, unspecified: Secondary | ICD-10-CM | POA: Diagnosis not present

## 2017-06-11 DIAGNOSIS — E6609 Other obesity due to excess calories: Secondary | ICD-10-CM | POA: Diagnosis not present

## 2017-06-11 DIAGNOSIS — M7072 Other bursitis of hip, left hip: Secondary | ICD-10-CM | POA: Diagnosis not present

## 2017-06-11 DIAGNOSIS — G894 Chronic pain syndrome: Secondary | ICD-10-CM

## 2017-06-11 DIAGNOSIS — M5416 Radiculopathy, lumbar region: Secondary | ICD-10-CM | POA: Diagnosis not present

## 2017-06-11 DIAGNOSIS — R296 Repeated falls: Secondary | ICD-10-CM | POA: Diagnosis not present

## 2017-06-11 MED ORDER — OXYCODONE-ACETAMINOPHEN 10-325 MG PO TABS: ORAL_TABLET | ORAL | 0 refills | Status: DC

## 2017-06-11 NOTE — Progress Notes (Signed)
Location:  [facility] clinic Provider:  Janari Yamada L. Mariea Clonts, D.O., C.M.D.  Code Status: DNR Goals of Care:  Advanced Directives 06/11/2017  Does Patient Have a Medical Advance Directive? Yes  Type of Advance Directive Out of facility DNR (pink MOST or yellow form)  Does patient want to make changes to medical advance directive? -  Copy of [place] in Chart? -  Would patient like information on creating a medical advance directive? -  Pre-existing out of facility DNR order (yellow form or pink MOST form) Yellow form placed in chart (order not valid for inpatient use)     Chief Complaint  Patient presents with  . Medical Management of Chronic Issues    3MTH FOLLOW-UP    HPI: Patient is a 82 y.o. female seen today for medical management of chronic diseases.  Her daughter is with her.  She is moving in with her daughter.  They've been doing a lot of lifting and packing.  She's used more than usual.  Can work until noon and then it feels like a brick is tied to a rope around her.  Never takes more than one at a time.  She is downsizing.  Trying to save money.  [name] will continue to come in the mornings three times a week to help her with her am routine.    She did have a fall a little over a week ago when she slid out of bed.  She's still going to PT.  It's helped a lot.    Spirits:  Has had some increased anxiety with the concerns about things she must get rid of--has items she wants to keep in the family. 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    Insomnia:  Sleeping better these days with moving.  No longer waking up at 3-4am now.  Was getting a total of 7 hrs anyway even when she did wake up early.  Dry mouth--sjogren's syndrome tests negative in 2015.  Taking seramides (sp?) for dry skin/eyes and mouth.  Discussed it's more from her medications which she needs.  Has restasis for her dry eyes.    Past Medical History:  Diagnosis Date  . Anemia   .  Anxiety   . Arthritis    hands, spine   . Blood transfusion   . Cervicalgia   . Constipation   . Cough    Wert-onset 08/2009, as of 2014- resolved   . Deep venous thrombosis ([place])    post op, rec'd/needed  blood thinner   . Depression   . Depression with anxiety   . Dysrhythmia   . Falls frequently    pt. reports that she was falling 3 times a day, has had PT at a facility for a while & now is getting home PT 2-3 times/ week   . Headache(784.0) 09/27/2011  . History of stress test    15-20 yrs.ago   . Hypotension   . Hypothyroidism   . Lumbar radiculopathy 09/26/2011  . Pneumonia    never been in hosp. for pneumonia   . Prediabetes   . Right hand fracture    Monday  . Shortness of breath   . Sleep apnea    doesn't use CPAP any longer, since weight loss    Past Surgical History:  Procedure Laterality Date  . ANTERIOR CERVICAL DECOMP/DISCECTOMY FUSION N/A 07/09/2013   Procedure: ANTERIOR CERVICAL DECOMPRESSION/DISCECTOMY FUSION 3 LEVELS;  Surgeon: Sinclair Ship, MD;  Location: [place];  Service: Orthopedics;  Laterality: N/A;  Anterior cervical decompression fusion, cervical 3-4, cervical 4-5-, cervical 5-6 with instrumentation, allograft.  . APPENDECTOMY    . BACK SURGERY     2000  . BREAST EXCISIONAL BIOPSY     breast bxs on left x 2, breast bx of right x 1-all benign  . EYE SURGERY     cataracts bilateral /w IOL  . HERNIA REPAIR     umbilical hernia- 9211  . JOINT REPLACEMENT  2006,2007   bilateral  . LAPAROSCOPIC OVARIAN CYSTECTOMY     not done by laparscopy-abdominal incision  . REPLACEMENT TOTAL KNEE BILATERAL    . TONSILLECTOMY      Allergies  Allergen Reactions  . Clindamycin Hcl Other (See Comments)    REACTION: swelling, pt. Reports that she had a 16 lb. weightgain in one day    Allergies as of 06/11/2017      Reactions   Clindamycin Hcl Other (See Comments)   REACTION: swelling, pt. Reports that she had a 16 lb. weightgain in one day        Medication List       Accurate as of 06/11/17 10:49 AM. Always use your most recent med list.          Brexpiprazole 1 MG Tabs Take 1 tablet by mouth at bedtime.   busPIRone 15 MG tablet Commonly known as:  BUSPAR Take 15 mg by mouth daily.   CALCIUM CITRATE PO Take 600 mg by mouth 2 (two) times daily.   clonazePAM 0.5 MG tablet Commonly known as:  KLONOPIN Take 1 tablet by mouth as needed in middle of the nite to go back to sleep   DULoxetine 60 MG capsule Commonly known as:  CYMBALTA Take 2 capsules (120 mg total) by mouth daily.   furosemide 40 MG tablet Commonly known as:  LASIX 1/2 by mouth daily   levothyroxine 50 MCG tablet Commonly known as:  SYNTHROID, LEVOTHROID TAKE ONE TABLET BY MOUTH ONCE DAILY 30 MINUTES BEFORE BREAKFAST FOR THYROID   meloxicam 15 MG tablet Commonly known as:  MOBIC TAKE 1 TABLET EVERY DAY   mirtazapine 15 MG tablet Commonly known as:  REMERON Take 7.5 mg by mouth at bedtime.   MULTIVITAMIN PO Take 1 tablet by mouth.   MYRBETRIQ 25 MG Tb24 tablet Generic drug:  mirabegron ER Take 25 mg by mouth daily.   oxyCODONE-acetaminophen 10-325 MG tablet Commonly known as:  PERCOCET Take 1-2 tablets by mouth every 4-6 hours as needed for Arthritis pain   RESTASIS 0.05 % ophthalmic emulsion Generic drug:  cycloSPORINE PLACE 1 DROP INTO BOTH EYES TWICE A DAY   traZODone 100 MG tablet Commonly known as:  DESYREL Take 2 tablets by mouth at bedtime   warfarin 5 MG tablet Commonly known as:  COUMADIN Take one tablet daily except 1/2 tablet on Monday and Friday as directed.       Review of Systems:  Review of Systems  Constitutional: Negative for chills, fever and malaise/fatigue.  HENT: Positive for hearing loss. Negative for congestion.   Eyes: Negative for blurred vision.       Glasses  Respiratory: Negative for cough and shortness of breath.   Cardiovascular: Positive for chest pain. Negative for palpitations and leg  swelling.       Had 5 min episode of right sided chest pain that radiated to her right arm yesterday when in seated position, but resolved on its own  Gastrointestinal: Positive  for constipation. Negative for abdominal pain, blood in stool and melena.  Genitourinary: Negative for dysuria.  Musculoskeletal: Positive for back pain, falls and myalgias.  Skin: Negative for itching and rash.       Dry scaly skin  Neurological: Negative for dizziness, loss of consciousness and weakness.  Endo/Heme/Allergies: Bruises/bleeds easily.       On warfarin  Psychiatric/Behavioral: Positive for memory loss. Negative for depression. The patient is not nervous/anxious and does not have insomnia.        Bipolar well controlled on current regimen for a couple of years now    Health Maintenance  Topic Date Due  . INFLUENZA VACCINE  06/20/2017  . URINE MICROALBUMIN  11/27/2017  . TETANUS/TDAP  11/27/2026  . DEXA SCAN  Completed  . PNA vac Low Risk Adult  Completed    Physical Exam: Vitals:   06/11/17 1040  BP: 132/82  Pulse: 80  Temp: 98.2 F (36.8 C)  TempSrc: Oral  SpO2: 96%  Weight: 191 lb (86.6 kg)   Body mass index is 36.09 kg/m. Physical Exam  Constitutional: She is oriented to person, place, and time. She appears well-developed and well-nourished. No distress.  HENT:  Head: Normocephalic and atraumatic.  Hearing aids  Eyes:  glasses  Cardiovascular:  irreg irreg  Pulmonary/Chest: Effort normal and breath sounds normal. No respiratory distress. She has no wheezes. She has no rales.  Abdominal: Soft. Bowel sounds are normal. She exhibits no distension. There is no tenderness.  Musculoskeletal: Normal range of motion.  Ambulates with walker  Neurological: She is alert and oriented to person, place, and time. No cranial nerve deficit.  Skin: Skin is warm and dry. Capillary refill takes less than 2 seconds.  Dry scaly skin of arms and legs  Psychiatric: She has a normal mood and  affect.    Labs reviewed: Basic Metabolic Panel:  Recent Labs  08/14/16 03/01/17 0820  NA 139 141  K 4.5 4.1  CL  --  103  CO2  --  30  GLUCOSE  --  87  BUN 28* 17  CREATININE 0.9 1.02*  CALCIUM  --  9.4  TSH 3.11 2.58   Liver Function Tests:  Recent Labs  08/14/16 03/01/17 0820  AST 17 19  ALT 8 9  ALKPHOS 52 54  BILITOT  --  0.5  PROT  --  6.4  ALBUMIN  --  4.0   No results for input(s): LIPASE, AMYLASE in the last 8760 hours. No results for input(s): AMMONIA in the last 8760 hours. CBC:  Recent Labs  08/14/16 03/01/17 0820  WBC 6.1 5.9  NEUTROABS  --  3,540  HGB 12.6 13.2  HCT 38 41.0  MCV  --  89.9  PLT 194 216   Lipid Panel:  Recent Labs  03/01/17 0820  CHOL 186  HDL 50*  LDLCALC 108*  TRIG 139  CHOLHDL 3.7   Lab Results  Component Value Date   HGBA1C 4.9 03/01/2017    Assessment/Plan 1. Chronic pain syndrome  with low back pain - having some flare of back pain with moving - will refill her pain medication--does not abuse or misuse her medication (not filled in a few mos) - oxyCODONE-acetaminophen (PERCOCET) 10-325 MG tablet; Take 1-2 tablets by mouth every 4-6 hours as needed for Arthritis pain  Dispense: 180 tablet; Refill: 0  2. Fall, initial encounter -slid to floor off bed w/o major injury -still getting PT and this has helped considerably with strength  and balance, using walker  3. Adult hypothyroidism -TSH has been wnl, cont levothyroxine 25mcg daily, weight down 1 lb since last visit  4. Bipolar I disorder, single manic episode (Antoine) -cont buspar, klonopin, cymbalta, remeron, trazodone and brexpiprazole thru psychiatry for her bipolar and chronic pain  5. Lumbar radiculopathy -cont cymbalta, prn percocet (never uses two pills at a time)  6. Class 2 obesity due to excess calories with serious comorbidity and body mass index (BMI) of 37.0 to 37.9 in adult -ongoing, down 1 lb from last visit -is more active moving lately so  would expect weight to trend down more next time--she is almost 82yo so would not push the weight loss issue with her falls and limited mobility  Labs/tests ordered:  No orders of the defined types were placed in this encounter.  Next appt:  06/25/2017   Yamilette Garretson L. Hensley Treat, D.O. [facility] [address], [place] [address] Cell Phone (Mon-Fri 8am-5pm):  [phone] On Call:  [phone] & follow prompts after 5pm & weekends Office Phone:  [phone] Office Fax:  [phone]

## 2017-06-13 DIAGNOSIS — R296 Repeated falls: Secondary | ICD-10-CM | POA: Diagnosis not present

## 2017-06-13 DIAGNOSIS — M7072 Other bursitis of hip, left hip: Secondary | ICD-10-CM | POA: Diagnosis not present

## 2017-06-13 DIAGNOSIS — Z9181 History of falling: Secondary | ICD-10-CM | POA: Diagnosis not present

## 2017-06-25 ENCOUNTER — Encounter: Payer: Self-pay | Admitting: Pharmacotherapy

## 2017-06-25 ENCOUNTER — Ambulatory Visit (INDEPENDENT_AMBULATORY_CARE_PROVIDER_SITE_OTHER): Payer: Medicare Other | Admitting: Pharmacotherapy

## 2017-06-25 VITALS — BP 120/76 | HR 70 | Ht 61.0 in | Wt 195.0 lb

## 2017-06-25 DIAGNOSIS — I480 Paroxysmal atrial fibrillation: Secondary | ICD-10-CM | POA: Diagnosis not present

## 2017-06-25 LAB — POCT INR: INR: 2.2

## 2017-06-25 NOTE — Patient Instructions (Signed)
INR 2.2  Continue Coumadin 77m daily except 2.531mon Mondays and Fridays

## 2017-06-25 NOTE — Progress Notes (Signed)
   Subjective:    Patient ID: Andrea [name], female    DOB: August 23, 1935, 82 y.o.   MRN: [id number]  HPI Last INR on 05/21/17 was OK at 2.5 Current Coumadin dose is 5mg  QD except 2.5mg  M/F Denies CP, palpitations, falls Denies missed doses Denies unusual bleeding or bruising Consistent with vitamin K intake.    Review of Systems  HENT: Negative for nosebleeds.   Cardiovascular: Negative for chest pain and palpitations.  Gastrointestinal: Negative for anal bleeding and blood in stool.  Genitourinary: Negative for hematuria.  Hematological: Does not bruise/bleed easily.       Objective:   Physical Exam  Constitutional: She is oriented to person, place, and time. She appears well-developed and well-nourished.  HENT:  Right Ear: External ear normal.  Left Ear: External ear normal.  Cardiovascular: Normal rate, regular rhythm and normal heart sounds.   Pulmonary/Chest: Effort normal and breath sounds normal.  Neurological: She is alert and oriented to person, place, and time.  Skin: Skin is warm and dry.  Psychiatric: She has a normal mood and affect. Her behavior is normal. Judgment and thought content normal.  Vitals reviewed.    BP: 120/76  HR: 70  Wt: 195lb INR 2.2     Assessment & Plan:  1.  INR at goal 2-3 2.  Continue Coumadin 5mg  QD except 2.5mg  M/F 3.  INR 1 month

## 2017-07-30 ENCOUNTER — Ambulatory Visit (INDEPENDENT_AMBULATORY_CARE_PROVIDER_SITE_OTHER): Payer: Medicare Other | Admitting: Pharmacotherapy

## 2017-07-30 ENCOUNTER — Encounter: Payer: Self-pay | Admitting: Pharmacotherapy

## 2017-07-30 VITALS — BP 118/74 | HR 77 | Temp 98.4°F | Resp 18 | Ht 61.0 in | Wt 192.2 lb

## 2017-07-30 DIAGNOSIS — I480 Paroxysmal atrial fibrillation: Secondary | ICD-10-CM

## 2017-07-30 DIAGNOSIS — Z7901 Long term (current) use of anticoagulants: Secondary | ICD-10-CM | POA: Diagnosis not present

## 2017-07-30 LAB — POCT INR: INR: 2.4

## 2017-07-30 MED ORDER — WARFARIN SODIUM 5 MG PO TABS: ORAL_TABLET | ORAL | 3 refills | Status: DC

## 2017-07-30 NOTE — Progress Notes (Signed)
   Subjective:    Patient ID: Andrea [name], female    DOB: Feb 22, 1935, 82 y.o.   MRN: [id number]  HPI Last INR on 06/25/17 was OK at 2.2 Current Coumadin dose is 5mg  QD except 2.5mg  M/F Denies missed doses. Denies unusual bleeding or bruising. Denies CP, palpitations, falls Consistent with vitamin K intake.   Review of Systems  HENT: Negative for nosebleeds.   Cardiovascular: Negative for chest pain and palpitations.  Gastrointestinal: Negative for anal bleeding and blood in stool.  Genitourinary: Negative for hematuria.  Hematological: Does not bruise/bleed easily.       Objective:   Physical Exam  Constitutional: She is oriented to person, place, and time. She appears well-developed and well-nourished.  HENT:  Right Ear: External ear normal.  Left Ear: External ear normal.  Cardiovascular: Normal rate and normal heart sounds.   Pulmonary/Chest: Effort normal and breath sounds normal.  Neurological: She is alert and oriented to person, place, and time.  Skin: Skin is warm and dry.  Psychiatric: She has a normal mood and affect. Her behavior is normal. Judgment and thought content normal.  Vitals reviewed.     BP: 118/74  HR: 77  Wt: 197.2lb INR 2.4    Assessment & Plan:  1.  INR at goal 2-3 2.  Continue Coumadin 5mg  qD except 2.5mg  M/F 3.  RTC 1 month

## 2017-07-30 NOTE — Patient Instructions (Signed)
INR 2.4  Continue Coumadin 5mg  daily except 2.5mg  (1/2 tablet) on Mondays and Fridays

## 2017-08-03 ENCOUNTER — Other Ambulatory Visit: Payer: Self-pay | Admitting: *Deleted

## 2017-08-03 DIAGNOSIS — Z7901 Long term (current) use of anticoagulants: Secondary | ICD-10-CM

## 2017-08-03 DIAGNOSIS — I480 Paroxysmal atrial fibrillation: Secondary | ICD-10-CM

## 2017-08-03 MED ORDER — WARFARIN SODIUM 5 MG PO TABS: ORAL_TABLET | ORAL | 1 refills | Status: DC

## 2017-08-03 NOTE — Telephone Encounter (Signed)
[facility] 

## 2017-08-27 ENCOUNTER — Ambulatory Visit (INDEPENDENT_AMBULATORY_CARE_PROVIDER_SITE_OTHER): Payer: Medicare Other | Admitting: Pharmacotherapy

## 2017-08-27 ENCOUNTER — Encounter: Payer: Self-pay | Admitting: Pharmacotherapy

## 2017-08-27 VITALS — BP 120/78 | HR 72 | Ht 61.0 in | Wt 199.0 lb

## 2017-08-27 DIAGNOSIS — I480 Paroxysmal atrial fibrillation: Secondary | ICD-10-CM

## 2017-08-27 DIAGNOSIS — G894 Chronic pain syndrome: Secondary | ICD-10-CM | POA: Diagnosis not present

## 2017-08-27 DIAGNOSIS — Z7901 Long term (current) use of anticoagulants: Secondary | ICD-10-CM

## 2017-08-27 LAB — POCT INR: INR: 3

## 2017-08-27 MED ORDER — OXYCODONE-ACETAMINOPHEN 10-325 MG PO TABS: ORAL_TABLET | ORAL | 0 refills | Status: DC

## 2017-08-27 NOTE — Patient Instructions (Signed)
INR 3.0  Continue Coumadin 5mg  daily except 2.5mg  (1/2 tablet) on Mondays and Fridays

## 2017-08-27 NOTE — Progress Notes (Signed)
   Subjective:    Patient ID: Andrea [name], female    DOB: [birth year], 82 y.o.   MRN: [id number]  HPI Last INR on 07/30/17 was OK at 2.4 Current coumadin dose is 5mg  QD except 2.5mg  M/F Denies any unusual bleeding or bruising Did miss a dose 2 weeks.ago. Denies CP, palpitations, falls Consistent with vitamin K Has been taking cranberry tablets.   Review of Systems  HENT: Negative for nosebleeds.   Cardiovascular: Negative for chest pain and palpitations.  Gastrointestinal: Negative for anal bleeding and blood in stool.  Genitourinary: Negative for hematuria.  Hematological: Does not bruise/bleed easily.       Objective:   Physical Exam  Constitutional: She is oriented to person, place, and time. She appears well-developed and well-nourished.  HENT:  Right Ear: External ear normal.  Left Ear: External ear normal.  Cardiovascular: Normal rate, regular rhythm and normal heart sounds.   Pulmonary/Chest: Effort normal and breath sounds normal.  Neurological: She is alert and oriented to person, place, and time.  Skin: Skin is warm and dry.  Psychiatric: She has a normal mood and affect. Her behavior is normal. Judgment and thought content normal.  Vitals reviewed.   BP: 120/78  HR: 72  Wt: 199lb INR 3.0      Assessment & Plan:  1.  INR at goal 2-3 2.  Continue Coumadin 5mg  daily except 2.5mg  Mondays and Fridays 3.  RTC 6 weeks

## 2017-09-11 ENCOUNTER — Encounter: Payer: Self-pay | Admitting: Nurse Practitioner

## 2017-09-11 ENCOUNTER — Ambulatory Visit (INDEPENDENT_AMBULATORY_CARE_PROVIDER_SITE_OTHER): Payer: Medicare Other | Admitting: Nurse Practitioner

## 2017-09-11 VITALS — BP 132/78 | HR 78 | Temp 99.2°F | Resp 18 | Ht 61.0 in | Wt 196.6 lb

## 2017-09-11 DIAGNOSIS — R0789 Other chest pain: Secondary | ICD-10-CM | POA: Diagnosis not present

## 2017-09-11 DIAGNOSIS — Z23 Encounter for immunization: Secondary | ICD-10-CM

## 2017-09-11 DIAGNOSIS — M79602 Pain in left arm: Secondary | ICD-10-CM

## 2017-09-11 NOTE — Progress Notes (Signed)
Careteam: Patient Care Team: Gayland Curry, DO as PCP - General (Geriatric Medicine) Tanda Rockers, MD as Consulting Physician (Pulmonary Disease)  Advanced Directive information Does Patient Have a Medical Advance Directive?: Yes, Type of Advance Directive: Out of facility DNR (pink MOST or yellow form), Pre-existing out of facility DNR order (yellow form or pink MOST form): Yellow form placed in chart (order not valid for inpatient use)  Allergies  Allergen Reactions  . Clindamycin Hcl Other (See Comments)    REACTION: swelling, pt. Reports that she had a 16 lb. weightgain in one day    Chief Complaint  Patient presents with  . Acute Visit    Pt is being seen due to shooting pain in left arm x 3 days. Pt stated that she thought she was having a heart attack but she never had chest pain. Pt reports that she feels like there is a weight on her chest.      HPI: Patient is a 82 y.o. female seen in the office today due to  pain in left arm for 3 days. Pt with hx of anxiety, bipolar, insomnia, overactive bladder, hypothyroid, chronic pain, a fib.  Was having a heaviness in her chest 3 days ago with severe pain to left arm and thought she was having a heart attack. Never had chest pains so she did not go anywhere. Has chronic shortness of breath but no new symptoms with this.  Denies chest pains or congestion.  Just chest heaviness which started at the same time the arm pain.  Reports arm pain was a bad ache that went down the entire arm. Denies shooting or stabbing. Denies numbness or tingling.   Also had a bad headache behind left ear which she has never had before. Lasted about an hour and then went away on its own after coffee. Pt reports she will get a  headache but this is rare.  Left arm was aching this morning but not painful, no current pain. Starting at shoulder blade and goes down. 3 days ago it was much worse. She has not taken anything new for the pain. Taking  oxycodone/apap for chronic back pain and it helps the arm as well. Using mobic as well.  Hx of neck pain but none in the last year.  Able to use arm but notices weakness.  No injury.  No falls.   Review of Systems:  Review of Systems  Constitutional: Negative for chills, fever and malaise/fatigue.  HENT: Positive for hearing loss. Negative for congestion.   Eyes: Negative for blurred vision.       Glasses  Respiratory: Negative for cough and shortness of breath.   Cardiovascular: Negative for chest pain, palpitations and leg swelling.       Chest pressure, no pain  Gastrointestinal: Positive for constipation. Negative for abdominal pain, blood in stool and melena.  Genitourinary: Negative for dysuria.  Musculoskeletal: Positive for back pain and myalgias. Negative for falls.       Left Arm pain that comes and goes, none currently   Skin: Negative for itching and rash.       Dry scaly skin  Neurological: Negative for dizziness, loss of consciousness and weakness.  Endo/Heme/Allergies: Bruises/bleeds easily.       On warfarin  Psychiatric/Behavioral: Positive for memory loss. Negative for depression. The patient is not nervous/anxious and does not have insomnia.        Bipolar well controlled on current regimen for a couple of  years now    Past Medical History:  Diagnosis Date  . Anemia   . Anxiety   . Arthritis    hands, spine   . Blood transfusion   . Cervicalgia   . Constipation   . Cough    Wert-onset 08/2009, as of 2014- resolved   . Deep venous thrombosis ([place])    post op, rec'd/needed  blood thinner   . Depression   . Depression with anxiety   . Dysrhythmia   . Falls frequently    pt. reports that she was falling 3 times a day, has had PT at a facility for a while & now is getting home PT 2-3 times/ week   . Headache(784.0) 09/27/2011  . History of stress test    15-20 yrs.ago   . Hypotension   . Hypothyroidism   . Lumbar radiculopathy 09/26/2011  . Pneumonia      never been in hosp. for pneumonia   . Prediabetes   . Right hand fracture    Monday  . Shortness of breath   . Sleep apnea    doesn't use CPAP any longer, since weight loss   Past Surgical History:  Procedure Laterality Date  . ANTERIOR CERVICAL DECOMP/DISCECTOMY FUSION N/A 07/09/2013   Procedure: ANTERIOR CERVICAL DECOMPRESSION/DISCECTOMY FUSION 3 LEVELS;  Surgeon: Sinclair Ship, MD;  Location: [place];  Service: Orthopedics;  Laterality: N/A;  Anterior cervical decompression fusion, cervical 3-4, cervical 4-5-, cervical 5-6 with instrumentation, allograft.  . APPENDECTOMY    . BACK SURGERY     2000  . BREAST EXCISIONAL BIOPSY     breast bxs on left x 2, breast bx of right x 1-all benign  . EYE SURGERY     cataracts bilateral /w IOL  . HERNIA REPAIR     umbilical hernia- 8469  . JOINT REPLACEMENT  2006,2007   bilateral  . LAPAROSCOPIC OVARIAN CYSTECTOMY     not done by laparscopy-abdominal incision  . REPLACEMENT TOTAL KNEE BILATERAL    . TONSILLECTOMY     Social History:   reports that she quit smoking about 48 years ago. Her smoking use included Cigarettes. She has a 10.00 pack-year smoking history. She has never used smokeless tobacco. She reports that she drinks about 0.6 oz of alcohol per week . She reports that she does not use drugs.  Family History  Problem Relation Age of Onset  . Heart disease Father   . Malignant hyperthermia Father   . Arthritis Father   . Deep vein thrombosis Son   . Diabetes Son   . Obesity Son   . Depression Son   . Hypertension Son   . Parkinson's disease Mother   . Diabetes Daughter   . Hypertension Daughter   . Anxiety disorder Daughter   . Hyperlipidemia Son   . Hypertension Son   . Cancer Maternal Aunt     Medications: Patient's Medications  New Prescriptions   No medications on file  Previous Medications   BREXPIPRAZOLE 1 MG TABS    Take 1 tablet by mouth at bedtime.   BUSPIRONE (BUSPAR) 15 MG TABLET    Take 15 mg  by mouth daily.   CALCIUM CITRATE PO    Take 600 mg by mouth 2 (two) times daily.   CLONAZEPAM (KLONOPIN) 0.5 MG TABLET    Take 1 tablet by mouth as needed in middle of the nite to go back to sleep   CRANBERRY EXTRACT PO    Take 2 tablets  by mouth daily.   DULOXETINE (CYMBALTA) 60 MG CAPSULE    Take 2 capsules (120 mg total) by mouth daily.   FUROSEMIDE (LASIX) 40 MG TABLET    1/2 by mouth daily   LEVOTHYROXINE (SYNTHROID, LEVOTHROID) 50 MCG TABLET    TAKE ONE TABLET BY MOUTH ONCE DAILY 30 MINUTES BEFORE BREAKFAST FOR THYROID   MELOXICAM (MOBIC) 15 MG TABLET    TAKE 1 TABLET EVERY DAY   MIRTAZAPINE (REMERON) 15 MG TABLET    Take 7.5 mg by mouth at bedtime.   MULTIPLE VITAMINS-MINERALS (MULTIVITAMIN PO)    Take 1 tablet by mouth.   OXYCODONE-ACETAMINOPHEN (PERCOCET) 10-325 MG TABLET    Take 1-2 tablets by mouth every 4-6 hours as needed for Arthritis pain   RESTASIS 0.05 % OPHTHALMIC EMULSION    PLACE 1 DROP INTO BOTH EYES TWICE A DAY   TRAZODONE (DESYREL) 100 MG TABLET    Take 2 tablets by mouth at bedtime   WARFARIN (COUMADIN) 5 MG TABLET    Take one tablet daily except 1/2 tablet on Monday and Friday as directed.  Modified Medications   No medications on file  Discontinued Medications   No medications on file     Physical Exam:  Vitals:   09/11/17 1201  BP: 132/78  Pulse: 78  Resp: 18  Temp: 99.2 F (37.3 C)  TempSrc: Oral  SpO2: 97%  Weight: 196 lb 9.6 oz (89.2 kg)  Height: 5\' 1"  (1.549 m)   Body mass index is 37.15 kg/m.  Physical Exam  Constitutional: She is oriented to person, place, and time. She appears well-developed and well-nourished. No distress.  HENT:  Head: Normocephalic and atraumatic.  Hearing aids  Eyes:  glasses  Cardiovascular: Normal rate and regular rhythm.   Pulmonary/Chest: Effort normal and breath sounds normal. No respiratory distress. She has no wheezes. She has no rales.  Abdominal: Soft. Bowel sounds are normal. She exhibits no distension.  There is no tenderness.  Musculoskeletal: Normal range of motion. She exhibits no edema or tenderness.  Ambulates with walker No pain or tenderness to left shoulder, arm, elbow  Neurological: She is alert and oriented to person, place, and time. No cranial nerve deficit.  Skin: Skin is warm and dry. Capillary refill takes less than 2 seconds.  Dry scaly skin of arms and legs  Psychiatric: She has a normal mood and affect.    Labs reviewed: Basic Metabolic Panel:  Recent Labs  03/01/17 0820  NA 141  K 4.1  CL 103  CO2 30  GLUCOSE 87  BUN 17  CREATININE 1.02*  CALCIUM 9.4  TSH 2.58   Liver Function Tests:  Recent Labs  03/01/17 0820  AST 19  ALT 9  ALKPHOS 54  BILITOT 0.5  PROT 6.4  ALBUMIN 4.0   No results for input(s): LIPASE, AMYLASE in the last 8760 hours. No results for input(s): AMMONIA in the last 8760 hours. CBC:  Recent Labs  03/01/17 0820  WBC 5.9  NEUTROABS 3,540  HGB 13.2  HCT 41.0  MCV 89.9  PLT 216   Lipid Panel:  Recent Labs  03/01/17 0820  CHOL 186  HDL 50*  LDLCALC 108*  TRIG 139  CHOLHDL 3.7   TSH:  Recent Labs  03/01/17 0820  TSH 2.58   A1C: Lab Results  Component Value Date   HGBA1C 4.9 03/01/2017     Assessment/Plan 1. Other chest pain -EKG- NSR rate of 72, no signs of ischemia noted.  2. Left arm pain Currently resolved, no tenderness or pain with movement. To cont mobic and oxycodone as needed for pain.   3. Encounter for immunization - Flu vaccine HIGH DOSE PF  Next appt: as scheduled.  Carlos American. Harle Battiest  [facility] [phone] 8 am - 5 pm) [phone] (after hours)

## 2017-10-08 ENCOUNTER — Ambulatory Visit (INDEPENDENT_AMBULATORY_CARE_PROVIDER_SITE_OTHER): Payer: Medicare Other | Admitting: Pharmacotherapy

## 2017-10-08 ENCOUNTER — Encounter: Payer: Self-pay | Admitting: Pharmacotherapy

## 2017-10-08 VITALS — BP 124/80 | HR 72 | Ht 61.0 in | Wt 195.8 lb

## 2017-10-08 DIAGNOSIS — Z7901 Long term (current) use of anticoagulants: Secondary | ICD-10-CM

## 2017-10-08 DIAGNOSIS — I480 Paroxysmal atrial fibrillation: Secondary | ICD-10-CM

## 2017-10-08 LAB — POCT INR: INR: 2.2

## 2017-10-08 NOTE — Patient Instructions (Signed)
INR 2.2  Continue Coumadin 5mg  daily except 2.5mg  (1/2 tablet) on Mondays and Fridays

## 2017-10-08 NOTE — Progress Notes (Signed)
   Subjective:    Patient ID: Andrea [name], female    DOB: 20-Feb-1935, 82 y.o.   MRN: [id number]  HPI Last INR on 08/27/17 was OK at 3.0 Current coumadin dose is 5mg  QD except 2.5mg  M/F Denies CP, palpitations, falls Denies unusual bleeding or bruising Consistent with vitamin K intake Denies missed doses.   Review of Systems  HENT: Negative for nosebleeds.   Respiratory: Negative for shortness of breath.   Cardiovascular: Negative for chest pain and palpitations.  Gastrointestinal: Negative for anal bleeding and blood in stool.  Genitourinary: Negative for hematuria.  Hematological: Does not bruise/bleed easily.       Objective:   Physical Exam  Constitutional: She is oriented to person, place, and time. She appears well-developed and well-nourished.  HENT:  Right Ear: External ear normal.  Left Ear: External ear normal.  Cardiovascular: Normal rate, regular rhythm and normal heart sounds.  Pulmonary/Chest: Effort normal and breath sounds normal.  Neurological: She is alert and oriented to person, place, and time.  Skin: Skin is warm and dry.  Psychiatric: She has a normal mood and affect. Her behavior is normal. Judgment and thought content normal.  Vitals reviewed.    BP: 124/80  HR: 72  Wt: 195lb INR 2.2     Assessment & Plan:  1.  INR 2-3 2.  Continue Coumadin 5mg  QD except 2.5mg  M/F 3.  RTC in 6 weeks.

## 2017-10-16 ENCOUNTER — Encounter: Payer: Self-pay | Admitting: Nurse Practitioner

## 2017-10-16 ENCOUNTER — Ambulatory Visit (INDEPENDENT_AMBULATORY_CARE_PROVIDER_SITE_OTHER): Payer: Medicare Other | Admitting: Nurse Practitioner

## 2017-10-16 ENCOUNTER — Ambulatory Visit
Admission: RE | Admit: 2017-10-16 | Discharge: 2017-10-16 | Disposition: A | Discharge status: Home / Self Care | Payer: Medicare Other | Source: Ambulatory Visit | Attending: Nurse Practitioner | Admitting: Nurse Practitioner

## 2017-10-16 VITALS — BP 128/84 | HR 77 | Temp 98.9°F | Resp 18 | Ht 61.0 in | Wt 196.3 lb

## 2017-10-16 DIAGNOSIS — R05 Cough: Secondary | ICD-10-CM | POA: Diagnosis not present

## 2017-10-16 DIAGNOSIS — J40 Bronchitis, not specified as acute or chronic: Secondary | ICD-10-CM | POA: Diagnosis not present

## 2017-10-16 NOTE — Patient Instructions (Signed)
mucinex DM by mouth twice daily with full glass of water for 1 week then as needed Increase water intake    Acute Bronchitis, Adult Acute bronchitis is when air tubes (bronchi) in the lungs suddenly get swollen. The condition can make it hard to breathe. It can also cause these symptoms:  A cough.  Coughing up clear, yellow, or green mucus.  Wheezing.  Chest congestion.  Shortness of breath.  A fever.  Body aches.  Chills.  A sore throat.  Follow these instructions at home: Medicines  Take over-the-counter and prescription medicines only as told by your doctor.  If you were prescribed an antibiotic medicine, take it as told by your doctor. Do not stop taking the antibiotic even if you start to feel better. General instructions  Rest.  Drink enough fluids to keep your pee (urine) clear or pale yellow.  Avoid smoking and secondhand smoke. If you smoke and you need help quitting, ask your doctor. Quitting will help your lungs heal faster.  Use an inhaler, cool mist vaporizer, or humidifier as told by your doctor.  Keep all follow-up visits as told by your doctor. This is important. How is this prevented? To lower your risk of getting this condition again:  Wash your hands often with soap and water. If you cannot use soap and water, use hand sanitizer.  Avoid contact with people who have cold symptoms.  Try not to touch your hands to your mouth, nose, or eyes.  Make sure to get the flu shot every year.  Contact a doctor if:  Your symptoms do not get better in 2 weeks. Get help right away if:  You cough up blood.  You have chest pain.  You have very bad shortness of breath.  You become dehydrated.  You faint (pass out) or keep feeling like you are going to pass out.  You keep throwing up (vomiting).  You have a very bad headache.  Your fever or chills gets worse. This information is not intended to replace advice given to you by your health care  provider. Make sure you discuss any questions you have with your health care provider. Document Released: 04/24/2008 Document Revised: 06/14/2016 Document Reviewed: 04/26/2016 Elsevier Interactive Patient Education  2017 [employer].

## 2017-10-16 NOTE — Progress Notes (Signed)
Careteam: Patient Care Team: Gayland Curry, DO as PCP - General (Geriatric Medicine) Tanda Rockers, MD as Consulting Physician (Pulmonary Disease)  Advanced Directive information Does Patient Have a Medical Advance Directive?: Yes, Type of Advance Directive: Out of facility DNR (pink MOST or yellow form), Pre-existing out of facility DNR order (yellow form or pink MOST form): Yellow form placed in chart (order not valid for inpatient use)  Allergies  Allergen Reactions  . Clindamycin Hcl Other (See Comments)    REACTION: swelling, pt. Reports that she had a 16 lb. weightgain in one day    Chief Complaint  Patient presents with  . Acute Visit    Pt is being seen for a non productive cough, congestion, sob, and weakness x 2 days.   . Other    Daughter, Tammi Klippel, in room     HPI: Patient is a 82 y.o. female seen in the office today due to productive cough, congestion and shortness of breath with weakness for 2 days.  Pt with pmh of shortness of breath, anemia, hypothyroid, htn, CVD, DM, chronic pain, afib on coumadin.  Coughing has progressively gotten worse. Feels like it is in her chest. Started taking mucinex yesterday feels like it is down further.  No fevers. Reports she had chills 2 nights ago.  No malaise but feels weak.  Cough is better today than yesterday.  Weakness worse.  Decrease appetite. Trying to drink a lot of fluids.   Review of Systems:  Review of Systems  Constitutional: Negative for chills, fever and malaise/fatigue.  HENT: Positive for ear pain. Negative for congestion and sore throat.   Respiratory: Positive for cough and shortness of breath. Negative for sputum production and wheezing.   Cardiovascular: Negative for chest pain.  Neurological: Positive for weakness and headaches.    Past Medical History:  Diagnosis Date  . Anemia   . Anxiety   . Arthritis    hands, spine   . Blood transfusion   . Cervicalgia   . Constipation   . Cough    Wert-onset 08/2009, as of 2014- resolved   . Deep venous thrombosis ([place])    post op, rec'd/needed  blood thinner   . Depression   . Depression with anxiety   . Dysrhythmia   . Falls frequently    pt. reports that she was falling 3 times a day, has had PT at a facility for a while & now is getting home PT 2-3 times/ week   . Headache(784.0) 09/27/2011  . History of stress test    15-20 yrs.ago   . Hypotension   . Hypothyroidism   . Lumbar radiculopathy 09/26/2011  . Pneumonia    never been in hosp. for pneumonia   . Prediabetes   . Right hand fracture    Monday  . Shortness of breath   . Sleep apnea    doesn't use CPAP any longer, since weight loss   Past Surgical History:  Procedure Laterality Date  . ANTERIOR CERVICAL DECOMP/DISCECTOMY FUSION N/A 07/09/2013   Procedure: ANTERIOR CERVICAL DECOMPRESSION/DISCECTOMY FUSION 3 LEVELS;  Surgeon: Sinclair Ship, MD;  Location: [facility];  Service: Orthopedics;  Laterality: N/A;  Anterior cervical decompression fusion, cervical 3-4, cervical 4-5-, cervical 5-6 with instrumentation, allograft.  . APPENDECTOMY    . BACK SURGERY     2000  . BREAST EXCISIONAL BIOPSY     breast bxs on left x 2, breast bx of right x 1-all benign  . EYE  SURGERY     cataracts bilateral /w IOL  . HERNIA REPAIR     umbilical hernia- 6720  . JOINT REPLACEMENT  2006,2007   bilateral  . LAPAROSCOPIC OVARIAN CYSTECTOMY     not done by laparscopy-abdominal incision  . REPLACEMENT TOTAL KNEE BILATERAL    . TONSILLECTOMY     Social History:   reports that she quit smoking about 48 years ago. Her smoking use included cigarettes. She has a 10.00 pack-year smoking history. she has never used smokeless tobacco. She reports that she drinks about 0.6 oz of alcohol per week. She reports that she does not use drugs.  Family History  Problem Relation Age of Onset  . Heart disease Father   . Malignant hyperthermia Father   . Arthritis Father   . Deep vein thrombosis  Son   . Diabetes Son   . Obesity Son   . Depression Son   . Hypertension Son   . Parkinson's disease Mother   . Diabetes Daughter   . Hypertension Daughter   . Anxiety disorder Daughter   . Hyperlipidemia Son   . Hypertension Son   . Cancer Maternal Aunt     Medications:   Medication List        Accurate as of 10/16/17  1:56 PM. Always use your most recent med list.          Brexpiprazole 1 MG Tabs   busPIRone 15 MG tablet Commonly known as:  BUSPAR   CALCIUM CITRATE PO   clonazePAM 0.5 MG tablet Commonly known as:  KLONOPIN Take 1 tablet by mouth as needed in middle of the nite to go back to sleep   CRANBERRY EXTRACT PO   DULoxetine 60 MG capsule Commonly known as:  CYMBALTA Take 2 capsules (120 mg total) by mouth daily.   furosemide 40 MG tablet Commonly known as:  LASIX   levothyroxine 50 MCG tablet Commonly known as:  SYNTHROID, LEVOTHROID TAKE ONE TABLET BY MOUTH ONCE DAILY 30 MINUTES BEFORE BREAKFAST FOR THYROID   meloxicam 15 MG tablet Commonly known as:  MOBIC TAKE 1 TABLET EVERY DAY   mirtazapine 15 MG tablet Commonly known as:  REMERON   MULTIVITAMIN PO   oxyCODONE-acetaminophen 10-325 MG tablet Commonly known as:  PERCOCET Take 1-2 tablets by mouth every 4-6 hours as needed for Arthritis pain   RESTASIS 0.05 % ophthalmic emulsion Generic drug:  cycloSPORINE   traZODone 100 MG tablet Commonly known as:  DESYREL   warfarin 5 MG tablet Commonly known as:  COUMADIN Take as directed by the anticoagulation clinic. If you are unsure how to take this medication, talk to your nurse or doctor. Original instructions:  Take one tablet daily except 1/2 tablet on Monday and Friday as directed.        Physical Exam:  Vitals:   10/16/17 1350  BP: 128/84  Pulse: 77  Resp: 18  Temp: 98.9 F (37.2 C)  TempSrc: Oral  SpO2: 96%  Weight: 196 lb 4.8 oz (89 kg)  Height: 5\' 1"  (1.549 m)   Body mass index is 37.09 kg/m.  Physical Exam    Constitutional: She is oriented to person, place, and time. She appears well-developed and well-nourished. No distress.  HENT:  Head: Normocephalic and atraumatic.  Right Ear: External ear normal.  Left Ear: External ear normal.  Nose: Nose normal.  Mouth/Throat: Oropharynx is clear and moist. No oropharyngeal exudate.  MM dry  Eyes: EOM are normal. Pupils are equal, round, and  reactive to light.  glasses  Neck: Normal range of motion. Neck supple.  Cardiovascular: Normal rate and regular rhythm.  Pulmonary/Chest: Effort normal and breath sounds normal. No stridor. No respiratory distress. She has no wheezes. She has no rales.  Musculoskeletal: Normal range of motion.  Ambulates with walker   Neurological: She is alert and oriented to person, place, and time.  Skin: Skin is warm and dry. Capillary refill takes less than 2 seconds.  Dry scaly skin of arms and legs  Psychiatric: She has a normal mood and affect.    Labs reviewed: Basic Metabolic Panel: Recent Labs    03/01/17 0820  NA 141  K 4.1  CL 103  CO2 30  GLUCOSE 87  BUN 17  CREATININE 1.02*  CALCIUM 9.4  TSH 2.58   Liver Function Tests: Recent Labs    03/01/17 0820  AST 19  ALT 9  ALKPHOS 54  BILITOT 0.5  PROT 6.4  ALBUMIN 4.0   No results for input(s): LIPASE, AMYLASE in the last 8760 hours. No results for input(s): AMMONIA in the last 8760 hours. CBC: Recent Labs    03/01/17 0820  WBC 5.9  NEUTROABS 3,540  HGB 13.2  HCT 41.0  MCV 89.9  PLT 216   Lipid Panel: Recent Labs    03/01/17 0820  CHOL 186  HDL 50*  LDLCALC 108*  TRIG 139  CHOLHDL 3.7   TSH: Recent Labs    03/01/17 0820  TSH 2.58   A1C: Lab Results  Component Value Date   HGBA1C 4.9 03/01/2017     Assessment/Plan 1. Bronchitis Cough and congestion for 2 days, supportive care at this time. mucinex DM by mouth twice daily Increase hydration and nutrition. - encouraged deep breathing exercises and to get oob  - DG  Chest 2 View to rule out pneumonia -return precautions discussed, pt has routine follow up with Dr Mariea Clonts scheduled in 2 days   Carlos American. Harle Battiest  [facility] [phone] 8 am - 5 pm) [phone] (after hours)

## 2017-10-18 ENCOUNTER — Encounter: Payer: Self-pay | Admitting: Internal Medicine

## 2017-10-18 ENCOUNTER — Ambulatory Visit (INDEPENDENT_AMBULATORY_CARE_PROVIDER_SITE_OTHER): Payer: Medicare Other | Admitting: Internal Medicine

## 2017-10-18 VITALS — BP 120/60 | HR 82 | Temp 99.0°F | Wt 191.0 lb

## 2017-10-18 DIAGNOSIS — D6869 Other thrombophilia: Secondary | ICD-10-CM

## 2017-10-18 DIAGNOSIS — I480 Paroxysmal atrial fibrillation: Secondary | ICD-10-CM

## 2017-10-18 DIAGNOSIS — G894 Chronic pain syndrome: Secondary | ICD-10-CM | POA: Diagnosis not present

## 2017-10-18 DIAGNOSIS — E1169 Type 2 diabetes mellitus with other specified complication: Secondary | ICD-10-CM | POA: Diagnosis not present

## 2017-10-18 DIAGNOSIS — J40 Bronchitis, not specified as acute or chronic: Secondary | ICD-10-CM

## 2017-10-18 DIAGNOSIS — I4891 Unspecified atrial fibrillation: Secondary | ICD-10-CM

## 2017-10-18 DIAGNOSIS — E119 Type 2 diabetes mellitus without complications: Secondary | ICD-10-CM

## 2017-10-18 DIAGNOSIS — N3946 Mixed incontinence: Secondary | ICD-10-CM

## 2017-10-18 DIAGNOSIS — E785 Hyperlipidemia, unspecified: Secondary | ICD-10-CM

## 2017-10-18 MED ORDER — BENZONATATE 100 MG PO CAPS: 100.0000 mg | ORAL_CAPSULE | Freq: Two times a day (BID) | ORAL | 0 refills | Status: DC | PRN

## 2017-10-18 MED ORDER — FUROSEMIDE 40 MG PO TABS: 20.0000 mg | ORAL_TABLET | Freq: Every day | ORAL | 1 refills | Status: DC

## 2017-10-18 MED ORDER — AZITHROMYCIN 250 MG PO TABS: ORAL_TABLET | ORAL | 0 refills | Status: DC

## 2017-10-18 NOTE — Addendum Note (Signed)
Addended by: Hollace Kinnier L on: 10/18/2017 01:26 PM   Modules accepted: Level of Service

## 2017-10-18 NOTE — Progress Notes (Signed)
Location:  [facility] clinic Provider:  Thayer Embleton L. Mariea Clonts, D.O., C.M.D.  Code Status: DNR Goals of Care:  Advanced Directives 10/18/2017  Does Patient Have a Medical Advance Directive? Yes  Type of Advance Directive Out of facility DNR (pink MOST or yellow form)  Does patient want to make changes to medical advance directive? No - Patient declined  Copy of Sun in Chart? -  Would patient like information on creating a medical advance directive? -  Pre-existing out of facility DNR order (yellow form or pink MOST form) Yellow form placed in chart (order not valid for inpatient use)     Chief Complaint  Patient presents with  . Medical Management of Chronic Issues    47mth follow-up    HPI: Patient is a 82 y.o. female seen today for medical management of chronic diseases.    Chills, cough, decreased appetite 11/27, weakness and slower gait now today.  Phlegm has gone from clear to green.  CXR was negative for pneumonia.  No infiltrate, effusion or mass.    Yesterday had coffee at breakfast, scrambled eggs and ham at lunch and then a protein shake at supper.  Not doing well drinking her water past few days either.  Off and on getting some rest with coughing (30 min intervals).  Says dog is worried about her.  No further chills in past couple of days.  Using mucinex.  Nothing else for the infection.    Foot exam done.  DMII:  Remains on diet only.  Has had normal hba1c for a long time.  Hyperlipidemia:  We stopped statin due to age and muscle weakness.  Chronic pain syndrome:  Controlled fairly well with mobic and prn percocet for severe back pain only, cymbalta  Hypothyroidism:  Continue levothyroxine.   Lab Results  Component Value Date   TSH 2.58 03/01/2017   Hypercoagulable state from afib on coumadin monitored by Dr. Tivis Ringer.    Past Medical History:  Diagnosis Date  . Anemia   . Anxiety   . Arthritis    hands, spine   . Blood transfusion   .  Cervicalgia   . Constipation   . Cough    Wert-onset 08/2009, as of 2014- resolved   . Deep venous thrombosis ([place])    post op, rec'd/needed  blood thinner   . Depression   . Depression with anxiety   . Dysrhythmia   . Falls frequently    pt. reports that she was falling 3 times a day, has had PT at a facility for a while & now is getting home PT 2-3 times/ week   . Headache(784.0) 09/27/2011  . History of stress test    15-20 yrs.ago   . Hypotension   . Hypothyroidism   . Lumbar radiculopathy 09/26/2011  . Pneumonia    never been in hosp. for pneumonia   . Prediabetes   . Right hand fracture    Monday  . Shortness of breath   . Sleep apnea    doesn't use CPAP any longer, since weight loss    Past Surgical History:  Procedure Laterality Date  . ANTERIOR CERVICAL DECOMP/DISCECTOMY FUSION N/A 07/09/2013   Procedure: ANTERIOR CERVICAL DECOMPRESSION/DISCECTOMY FUSION 3 LEVELS;  Surgeon: Sinclair Ship, MD;  Location: [facility];  Service: Orthopedics;  Laterality: N/A;  Anterior cervical decompression fusion, cervical 3-4, cervical 4-5-, cervical 5-6 with instrumentation, allograft.  . APPENDECTOMY    . BACK SURGERY     2000  .  BREAST EXCISIONAL BIOPSY     breast bxs on left x 2, breast bx of right x 1-all benign  . EYE SURGERY     cataracts bilateral /w IOL  . HERNIA REPAIR     umbilical hernia- 0960  . JOINT REPLACEMENT  2006,2007   bilateral  . LAPAROSCOPIC OVARIAN CYSTECTOMY     not done by laparscopy-abdominal incision  . REPLACEMENT TOTAL KNEE BILATERAL    . TONSILLECTOMY      Allergies  Allergen Reactions  . Clindamycin Hcl Other (See Comments)    REACTION: swelling, pt. Reports that she had a 16 lb. weightgain in one day    Outpatient Encounter Medications as of 10/18/2017  Medication Sig  . Brexpiprazole 1 MG TABS Take 1 tablet by mouth at bedtime.  . busPIRone (BUSPAR) 15 MG tablet Take 15 mg by mouth daily.  [name] CALCIUM CITRATE PO Take 600 mg by mouth 2  (two) times daily.  . clonazePAM (KLONOPIN) 0.5 MG tablet Take 1 tablet by mouth as needed in middle of the nite to go back to sleep  . CRANBERRY EXTRACT PO Take 2 tablets by mouth daily.  . DULoxetine (CYMBALTA) 60 MG capsule Take 2 capsules (120 mg total) by mouth daily.  . furosemide (LASIX) 40 MG tablet Take 0.5 tablets (20 mg total) by mouth daily.  [name] levothyroxine (SYNTHROID, LEVOTHROID) 50 MCG tablet TAKE ONE TABLET BY MOUTH ONCE DAILY 30 MINUTES BEFORE BREAKFAST FOR THYROID  . meloxicam (MOBIC) 15 MG tablet TAKE 1 TABLET EVERY DAY  . mirtazapine (REMERON) 15 MG tablet Take 7.5 mg by mouth at bedtime.  . Multiple Vitamins-Minerals (MULTIVITAMIN PO) Take 1 tablet by mouth.  . oxyCODONE-acetaminophen (PERCOCET) 10-325 MG tablet Take 1-2 tablets by mouth every 4-6 hours as needed for Arthritis pain  . RESTASIS 0.05 % ophthalmic emulsion PLACE 1 DROP INTO BOTH EYES TWICE A DAY  . traZODone (DESYREL) 100 MG tablet Take 2 tablets by mouth at bedtime  . warfarin (COUMADIN) 5 MG tablet Take one tablet daily except 1/2 tablet on Monday and Friday as directed.  . [DISCONTINUED] furosemide (LASIX) 40 MG tablet 1/2 by mouth daily   No facility-administered encounter medications on file as of 10/18/2017.     Review of Systems:  Review of Systems  Constitutional: Positive for malaise/fatigue. Negative for chills and fever.  HENT: Positive for congestion and hearing loss. Negative for sinus pain and sore throat.   Eyes: Negative for blurred vision.       Glasses  Respiratory: Negative for cough and shortness of breath.   Cardiovascular: Negative for chest pain, palpitations and leg swelling.  Gastrointestinal: Negative for abdominal pain, blood in stool, constipation and melena.  Genitourinary: Negative for dysuria.  Musculoskeletal: Negative for falls.  Skin: Negative for itching and rash.  Neurological: Positive for weakness. Negative for dizziness and loss of consciousness.    Endo/Heme/Allergies: Bruises/bleeds easily.  Psychiatric/Behavioral: Positive for memory loss. Negative for depression. The patient is not nervous/anxious and does not have insomnia.        Insomnia due to cough now    Health Maintenance  Topic Date Due  . FOOT EXAM  06/16/1945  . OPHTHALMOLOGY EXAM  06/16/1945  . HEMOGLOBIN A1C  08/31/2017  . URINE MICROALBUMIN  11/27/2017  . TETANUS/TDAP  11/27/2026  . INFLUENZA VACCINE  Completed  . DEXA SCAN  Completed  . PNA vac Low Risk Adult  Completed    Physical Exam: Vitals:   10/18/17 0857  BP:  120/60  Pulse: 82  Temp: 99 F (37.2 C)  TempSrc: Oral  SpO2: 96%  Weight: 191 lb (86.6 kg)   Body mass index is 36.09 kg/m. Physical Exam  Constitutional: She is oriented to person, place, and time. She appears well-developed. No distress.  Appears ill, walking very slowly and less steady  HENT:  Head: Normocephalic and atraumatic.  Right Ear: External ear normal.  Left Ear: External ear normal.  Nose: Nose normal.  Postnasal drip  Eyes: Conjunctivae and EOM are normal. Pupils are equal, round, and reactive to light.  Neck: Normal range of motion. Neck supple. No thyromegaly present.  Cardiovascular: Normal rate, regular rhythm and intact distal pulses.  Pulmonary/Chest: Effort normal and breath sounds normal. No respiratory distress. She has no wheezes.  No rhonchi  Abdominal: Soft. Bowel sounds are normal. She exhibits no distension. There is no tenderness.  Musculoskeletal: Normal range of motion.  Walking very slowly with rollator walker  Lymphadenopathy:    She has cervical adenopathy.  Neurological: She is alert and oriented to person, place, and time.  Memory worse today; foot exam done  Skin: Skin is warm and dry. Capillary refill takes less than 2 seconds.  Psychiatric: She has a normal mood and affect.    Labs reviewed: Basic Metabolic Panel: Recent Labs    03/01/17 0820  NA 141  K 4.1  CL 103  CO2 30   GLUCOSE 87  BUN 17  CREATININE 1.02*  CALCIUM 9.4  TSH 2.58   Liver Function Tests: Recent Labs    03/01/17 0820  AST 19  ALT 9  ALKPHOS 54  BILITOT 0.5  PROT 6.4  ALBUMIN 4.0   No results for input(s): LIPASE, AMYLASE in the last 8760 hours. No results for input(s): AMMONIA in the last 8760 hours. CBC: Recent Labs    03/01/17 0820  WBC 5.9  NEUTROABS 3,540  HGB 13.2  HCT 41.0  MCV 89.9  PLT 216   Lipid Panel: Recent Labs    03/01/17 0820  CHOL 186  HDL 50*  LDLCALC 108*  TRIG 139  CHOLHDL 3.7   Lab Results  Component Value Date   HGBA1C 4.9 03/01/2017    Procedures since last visit: Dg Chest 2 View  Result Date: 10/17/2017 CLINICAL DATA:  Bronchitis. Cough and congestion and short of breath EXAM: CHEST  2 VIEW COMPARISON:  09/26/2013 FINDINGS: Heart size and vascularity normal. Lungs are clear without infiltrate effusion or mass. Thoracic scoliosis and degenerative change.  Cervical spine fusion. IMPRESSION: No active cardiopulmonary disease. Electronically Signed   By: Franchot Gallo M.D.   On: 10/17/2017 08:40    Assessment/Plan 1. Bronchitis - she is not improving since last seen with decreased intake of food and fluids, not resting due to cough, severe malaise and chest pains with coughing now -CXR was negative for pneumonia -push po fluids and encourage intake (caregiver) - benzonatate (TESSALON) 100 MG capsule; Take 1 capsule (100 mg total) by mouth 2 (two) times daily as needed for cough.  Dispense: 50 capsule; Refill: 0 - azithromycin (ZITHROMAX) 250 MG tablet; Two tablets today and then one daily for 4 days  Dispense: 6 tablet; Refill: 0 - CBC with Differential/Platelet  2. Paroxysmal A-fib (HCC) - regular today, not on rate control meds, on chronic coumadin with goal 2-3 - CBC with Differential/Platelet  3. Chronic pain syndrome -ongoing, managed well with mobic, trazodone, cymbalta  4. Mixed urge and stress incontinence -not on meds  for her  incontinence any longer, is severe  5. Hyperlipidemia associated with type 2 diabetes mellitus ([place]) -off medications due to age, frailty/debility  10. Type 2 diabetes mellitus without complication, without long-term current use of insulin (HCC) -managed with diet only, has had hba1c in normal range - COMPLETE METABOLIC PANEL WITH GFR - Hemoglobin A1c  7. Hypercoagulable state due to atrial fibrillation (HCC) -will reduce coumadin while on zpak:  Reduce coumadin to 1/2 tablet daily while on zithromax antibiotic, then return to usual dosing of 5mg  all but two days per week (Monday and Friday).    Labs/tests ordered:   Orders Placed This Encounter  Procedures  . CBC with Differential/Platelet  . COMPLETE METABOLIC PANEL WITH GFR  . Hemoglobin A1c    Next appt:  11/19/2017 for INR check; f/u in 4 months for med mgt, return prn if not getting better from bronchitis  Terressa Evola L. Cuahutemoc Attar, D.O. [facility] [address], [place] [address] Cell Phone (Mon-Fri 8am-5pm):  [phone] On Call:  [phone] & follow prompts after 5pm & weekends Office Phone:  [phone] Office Fax:  [phone]

## 2017-10-18 NOTE — Patient Instructions (Addendum)
Reduce coumadin to 1/2 tablet daily while on zithromax antibiotic, then return to usual dosing of 5mg  all but two days per week (Monday and Friday).

## 2017-10-19 ENCOUNTER — Other Ambulatory Visit: Payer: Self-pay | Admitting: Internal Medicine

## 2017-10-19 LAB — COMPLETE METABOLIC PANEL WITH GFR
AG Ratio: 1.4 (calc) (ref 1.0–2.5)
ALT: 8 U/L (ref 6–29)
AST: 15 U/L (ref 10–35)
Albumin: 3.9 g/dL (ref 3.6–5.1)
Alkaline phosphatase (APISO): 63 U/L (ref 33–130)
BUN/Creatinine Ratio: 31 (calc) — ABNORMAL HIGH (ref 6–22)
BUN: 29 mg/dL — ABNORMAL HIGH (ref 7–25)
CO2: 28 mmol/L (ref 20–32)
Calcium: 9.3 mg/dL (ref 8.6–10.4)
Chloride: 99 mmol/L (ref 98–110)
Creat: 0.93 mg/dL — ABNORMAL HIGH (ref 0.60–0.88)
GFR, Est African American: 67 mL/min/{1.73_m2} (ref >=60)
GFR, Est Non African American: 58 mL/min/{1.73_m2} — ABNORMAL LOW (ref >=60)
Globulin: 2.7 g/dL (calc) (ref 1.9–3.7)
Glucose, Bld: 108 mg/dL — ABNORMAL HIGH (ref 65–99)
Potassium: 4.2 mmol/L (ref 3.5–5.3)
Sodium: 138 mmol/L (ref 135–146)
Total Bilirubin: 0.4 mg/dL (ref 0.2–1.2)
Total Protein: 6.6 g/dL (ref 6.1–8.1)

## 2017-10-19 LAB — CBC WITH DIFFERENTIAL/PLATELET
Basophils Absolute: 41 cells/uL (ref 0–200)
Basophils Relative: 0.6 %
Eosinophils Absolute: 221 cells/uL (ref 15–500)
Eosinophils Relative: 3.2 %
HCT: 38.3 % (ref 35.0–45.0)
Hemoglobin: 12.7 g/dL (ref 11.7–15.5)
Lymphs Abs: 1332 cells/uL (ref 850–3900)
MCH: 28.4 pg (ref 27.0–33.0)
MCHC: 33.2 g/dL (ref 32.0–36.0)
MCV: 85.7 fL (ref 80.0–100.0)
MPV: 10.6 fL (ref 7.5–12.5)
Monocytes Relative: 7.1 %
Neutro Abs: 4816 cells/uL (ref 1500–7800)
Neutrophils Relative %: 69.8 %
Platelets: 237 10*3/uL (ref 140–400)
RBC: 4.47 10*6/uL (ref 3.80–5.10)
RDW: 13.1 % (ref 11.0–15.0)
Total Lymphocyte: 19.3 %
WBC mixed population: 490 cells/uL (ref 200–950)
WBC: 6.9 10*3/uL (ref 3.8–10.8)

## 2017-10-19 LAB — HEMOGLOBIN A1C
Hgb A1c MFr Bld: 5.3 % of total Hgb (ref <5.7)
Mean Plasma Glucose: 105 (calc)
eAG (mmol/L): 5.8 (calc)

## 2017-11-06 ENCOUNTER — Other Ambulatory Visit: Payer: Self-pay | Admitting: *Deleted

## 2017-11-06 MED ORDER — MELOXICAM 15 MG PO TABS: ORAL_TABLET | ORAL | 1 refills | Status: DC

## 2017-11-06 NOTE — Telephone Encounter (Signed)
[facility] 

## 2017-11-14 ENCOUNTER — Ambulatory Visit (INDEPENDENT_AMBULATORY_CARE_PROVIDER_SITE_OTHER): Payer: Medicare Other | Admitting: Nurse Practitioner

## 2017-11-14 ENCOUNTER — Encounter: Payer: Self-pay | Admitting: Nurse Practitioner

## 2017-11-14 ENCOUNTER — Ambulatory Visit
Admission: RE | Admit: 2017-11-14 | Discharge: 2017-11-14 | Disposition: A | Discharge status: Home / Self Care | Payer: Medicare Other | Source: Ambulatory Visit | Attending: Nurse Practitioner | Admitting: Nurse Practitioner

## 2017-11-14 VITALS — BP 122/72 | HR 75 | Temp 98.4°F | Resp 17 | Ht 61.0 in | Wt 201.0 lb

## 2017-11-14 DIAGNOSIS — J069 Acute upper respiratory infection, unspecified: Secondary | ICD-10-CM | POA: Diagnosis not present

## 2017-11-14 DIAGNOSIS — R05 Cough: Secondary | ICD-10-CM | POA: Diagnosis not present

## 2017-11-14 MED ORDER — DOXYCYCLINE HYCLATE 100 MG PO TABS: 100.0000 mg | ORAL_TABLET | Freq: Two times a day (BID) | ORAL | 0 refills | Status: DC

## 2017-11-14 NOTE — Addendum Note (Signed)
Addended by: Denyse Amass on: 11/14/2017 02:55 PM   Modules accepted: Orders

## 2017-11-14 NOTE — Progress Notes (Signed)
Careteam: Patient Care Team: Gayland Curry, DO as PCP - General (Geriatric Medicine) Tanda Rockers, MD as Consulting Physician (Pulmonary Disease)  Advanced Directive information Does Patient Have a Medical Advance Directive?: Yes, Type of Advance Directive: Out of facility DNR (pink MOST or yellow form), Pre-existing out of facility DNR order (yellow form or pink MOST form): Yellow form placed in chart (order not valid for inpatient use)  Allergies  Allergen Reactions  . Clindamycin Hcl Other (See Comments)    REACTION: swelling, pt. Reports that she had a 16 lb. weightgain in one day    Chief Complaint  Patient presents with  . Acute Visit    Pt is being seen for non productive cough and congestion for 2 days.   . Other    Daughter in room     HPI: Patient is a 82 y.o. female seen in the office today for cough and congestion for 1 day.  Pt was seen November 27th due to productive cough and congestion, she was eventually placed on Zithromax due to progressive symptoms. States she was better from this but now she is more ran down.  Currently moved in with her daughter.  Cough is very bad. Gave her tessalon pearl earlier but not really helping. Has not taken anything else.  Has taken mucinex DM in the past but does not feel like it was very effective.  No fever but felt like she may have had one last night because she was wet.  Throat sore from coughing.  Clear drainage from nose is chronic for her.  No shortness of breath.  Hx of smoking- not currently.  Feels poorly  Review of Systems:  Review of Systems  Constitutional: Positive for malaise/fatigue. Negative for chills and fever.  HENT: Positive for congestion, hearing loss and sore throat. Negative for sinus pain.   Eyes: Negative for blurred vision.       Glasses  Respiratory: Positive for cough and wheezing. Negative for shortness of breath.   Cardiovascular: Negative for chest pain, palpitations and leg  swelling.  Neurological: Positive for weakness. Negative for dizziness and headaches.  Endo/Heme/Allergies: Bruises/bleeds easily.   Past Medical History:  Diagnosis Date  . Anemia   . Anxiety   . Arthritis    hands, spine   . Blood transfusion   . Cervicalgia   . Constipation   . Cough    Wert-onset 08/2009, as of 2014- resolved   . Deep venous thrombosis ([place])    post op, rec'd/needed  blood thinner   . Depression   . Depression with anxiety   . Dysrhythmia   . Falls frequently    pt. reports that she was falling 3 times a day, has had PT at a facility for a while & now is getting home PT 2-3 times/ week   . Headache(784.0) 09/27/2011  . History of stress test    15-20 yrs.ago   . Hypotension   . Hypothyroidism   . Lumbar radiculopathy 09/26/2011  . Pneumonia    never been in hosp. for pneumonia   . Prediabetes   . Right hand fracture    Monday  . Shortness of breath   . Sleep apnea    doesn't use CPAP any longer, since weight loss   Past Surgical History:  Procedure Laterality Date  . ANTERIOR CERVICAL DECOMP/DISCECTOMY FUSION N/A 07/09/2013   Procedure: ANTERIOR CERVICAL DECOMPRESSION/DISCECTOMY FUSION 3 LEVELS;  Surgeon: Sinclair Ship, MD;  Location: [facility];  Service: Orthopedics;  Laterality: N/A;  Anterior cervical decompression fusion, cervical 3-4, cervical 4-5-, cervical 5-6 with instrumentation, allograft.  . APPENDECTOMY    . BACK SURGERY     2000  . BREAST EXCISIONAL BIOPSY     breast bxs on left x 2, breast bx of right x 1-all benign  . EYE SURGERY     cataracts bilateral /w IOL  . HERNIA REPAIR     umbilical hernia- [id number]  . JOINT REPLACEMENT  2006,2007   bilateral  . LAPAROSCOPIC OVARIAN CYSTECTOMY     not done by laparscopy-abdominal incision  . REPLACEMENT TOTAL KNEE BILATERAL    . TONSILLECTOMY     Social History:   reports that she quit smoking about 49 years ago. Her smoking use included cigarettes. She has a 10.00 pack-year smoking  history. she has never used smokeless tobacco. She reports that she drinks about 0.6 oz of alcohol per week. She reports that she does not use drugs.  Family History  Problem Relation Age of Onset  . Heart disease Father   . Malignant hyperthermia Father   . Arthritis Father   . Deep vein thrombosis Son   . Diabetes Son   . Obesity Son   . Depression Son   . Hypertension Son   . Parkinson's disease Mother   . Diabetes Daughter   . Hypertension Daughter   . Anxiety disorder Daughter   . Hyperlipidemia Son   . Hypertension Son   . Cancer Maternal Aunt     Medications:   Medication List        Accurate as of 11/14/17 11:43 AM. Always use your most recent med list.          benzonatate 100 MG capsule Commonly known as:  TESSALON Take 1 capsule (100 mg total) by mouth 2 (two) times daily as needed for cough.   Brexpiprazole 1 MG Tabs   busPIRone 15 MG tablet Commonly known as:  BUSPAR   CALCIUM CITRATE PO   clonazePAM 0.5 MG tablet Commonly known as:  KLONOPIN Take 1 tablet by mouth as needed in middle of the nite to go back to sleep   CRANBERRY EXTRACT PO   DULoxetine 60 MG capsule Commonly known as:  CYMBALTA Take 2 capsules (120 mg total) by mouth daily.   furosemide 40 MG tablet Commonly known as:  LASIX Take 0.5 tablets (20 mg total) by mouth daily.   levothyroxine 50 MCG tablet Commonly known as:  SYNTHROID, LEVOTHROID TAKE ONE TABLET BY MOUTH ONCE DAILY 30 MINUTES BEFORE BREAKFAST FOR THYROID   meloxicam 15 MG tablet Commonly known as:  MOBIC Take one tablet by mouth once daily   mirtazapine 15 MG tablet Commonly known as:  REMERON   MULTIVITAMIN PO   oxyCODONE-acetaminophen 10-325 MG tablet Commonly known as:  PERCOCET Take 1-2 tablets by mouth every 4-6 hours as needed for Arthritis pain   RESTASIS 0.05 % ophthalmic emulsion Generic drug:  cycloSPORINE   traZODone 100 MG tablet Commonly known as:  DESYREL   warfarin 5 MG  tablet Commonly known as:  COUMADIN Take as directed by the anticoagulation clinic. If you are unsure how to take this medication, talk to your nurse or doctor. Original instructions:  Take one tablet daily except 1/2 tablet on Monday and Friday as directed.        Physical Exam:  Vitals:   11/14/17 1132  BP: 122/72  Pulse: 75  Resp: 17  Temp: 98.4 F (36.9 C)  TempSrc: Oral  SpO2: 97%  Weight: 201 lb (91.2 kg)  Height: 5\' 1"  (1.549 m)   Body mass index is 37.98 kg/m.  Physical Exam  Constitutional: She is oriented to person, place, and time. She appears well-developed. No distress.  Appears ill, walking very slowly and less steady  HENT:  Head: Normocephalic and atraumatic.  Right Ear: External ear normal.  Left Ear: External ear normal.  Nose: Nose normal.  Postnasal drip  Eyes: Conjunctivae and EOM are normal. Pupils are equal, round, and reactive to light.  Neck: Normal range of motion. Neck supple. No thyromegaly present.  Cardiovascular: Normal rate, regular rhythm and intact distal pulses.  Pulmonary/Chest: Effort normal. No respiratory distress. She has no wheezes. She has rhonchi in the left middle field and the left lower field.  Musculoskeletal: Normal range of motion.  Walking very slowly with rollator walker  Lymphadenopathy:    She has no cervical adenopathy.  Neurological: She is alert and oriented to person, place, and time.  Skin: Skin is warm and dry.  Psychiatric: She has a normal mood and affect.    Labs reviewed: Basic Metabolic Panel: Recent Labs    03/01/17 0820 10/18/17 0949  NA 141 138  K 4.1 4.2  CL 103 99  CO2 30 28  GLUCOSE 87 108*  BUN 17 29*  CREATININE 1.02* 0.93*  CALCIUM 9.4 9.3  TSH 2.58  --    Liver Function Tests: Recent Labs    03/01/17 0820 10/18/17 0949  AST 19 15  ALT 9 8  ALKPHOS 54  --   BILITOT 0.5 0.4  PROT 6.4 6.6  ALBUMIN 4.0  --    No results for input(s): LIPASE, AMYLASE in the last 8760  hours. No results for input(s): AMMONIA in the last 8760 hours. CBC: Recent Labs    03/01/17 0820 10/18/17 0949  WBC 5.9 6.9  NEUTROABS 3,540 4,816  HGB 13.2 12.7  HCT 41.0 38.3  MCV 89.9 85.7  PLT 216 237   Lipid Panel: Recent Labs    03/01/17 0820  CHOL 186  HDL 50*  LDLCALC 108*  TRIG 139  CHOLHDL 3.7   TSH: Recent Labs    03/01/17 0820  TSH 2.58   A1C: Lab Results  Component Value Date   HGBA1C 5.3 10/18/2017     Assessment/Plan 1. Upper respiratory infection with cough and congestion -symptoms x 1 day -mucinex DM by mouth twice daily -increase hydration.  - DG Chest 2 View to rule out pneumonia. -requesting antibiotic due to feeling so poorly, however to get chest xray first.   Janett Billow K. Harle Battiest  [facility] [phone] 8 am - 5 pm) [phone] (after hours)

## 2017-11-14 NOTE — Patient Instructions (Addendum)
Start mucinex Dm by mouth twice daily with full glass of water  To get chest xray at this time to rule out pneumonia. We will call with results

## 2017-11-16 ENCOUNTER — Encounter: Payer: Self-pay | Admitting: Nurse Practitioner

## 2017-11-16 ENCOUNTER — Ambulatory Visit (INDEPENDENT_AMBULATORY_CARE_PROVIDER_SITE_OTHER): Payer: Medicare Other | Admitting: Nurse Practitioner

## 2017-11-16 VITALS — BP 116/72 | HR 84 | Temp 98.7°F | Resp 17 | Ht 61.0 in | Wt 189.0 lb

## 2017-11-16 DIAGNOSIS — J40 Bronchitis, not specified as acute or chronic: Secondary | ICD-10-CM | POA: Diagnosis not present

## 2017-11-16 DIAGNOSIS — Z7901 Long term (current) use of anticoagulants: Secondary | ICD-10-CM

## 2017-11-16 DIAGNOSIS — I4891 Unspecified atrial fibrillation: Secondary | ICD-10-CM

## 2017-11-16 DIAGNOSIS — D6869 Other thrombophilia: Secondary | ICD-10-CM

## 2017-11-16 LAB — POCT INR: INR: 1.8

## 2017-11-16 NOTE — Progress Notes (Signed)
Careteam: Patient Care Team: Gayland Curry, DO as PCP - General (Geriatric Medicine) Tanda Rockers, MD as Consulting Physician (Pulmonary Disease)  Advanced Directive information Does Patient Have a Medical Advance Directive?: Yes, Type of Advance Directive: Out of facility DNR (pink MOST or yellow form), Pre-existing out of facility DNR order (yellow form or pink MOST form): Yellow form placed in chart (order not valid for inpatient use)  Allergies  Allergen Reactions  . Clindamycin Hcl Other (See Comments)    REACTION: swelling, pt. Reports that she had a 16 lb. weightgain in one day    Chief Complaint  Patient presents with  . Follow-up    Pt is being seen for an INR check. INR is 1.8 today.      HPI: Patient is a 82 y.o. female seen in the office today to follow up INR.  Pt was seen in office on 12/16 due to cough and congestion and feeling poorly, recently with bronchitis and question if she really ever recovered from this. Chest xray negative for pneumonia and she was placed on doxycycline 100 mg BID. Pt on coumadin due to hypercoagulable state/DVT/afib and here today to follow up on INR.  Coumadin was reduced to 2.5 mg daily while on antibiotics.   Review of Systems:  Review of Systems  Constitutional: Positive for malaise/fatigue. Negative for chills and fever.  HENT: Positive for congestion, hearing loss and sore throat. Negative for nosebleeds and sinus pain.   Eyes: Negative for blurred vision.       Glasses  Respiratory: Positive for cough and wheezing. Negative for hemoptysis and shortness of breath.   Cardiovascular: Negative for chest pain, palpitations and leg swelling.  Gastrointestinal: Negative for blood in stool.  Genitourinary: Negative for hematuria.  Neurological: Positive for weakness. Negative for dizziness and headaches.  Endo/Heme/Allergies: Bruises/bleeds easily.    Past Medical History:  Diagnosis Date  . Anemia   . Anxiety   .  Arthritis    hands, spine   . Blood transfusion   . Cervicalgia   . Constipation   . Cough    Wert-onset 08/2009, as of 2014- resolved   . Deep venous thrombosis ([place])    post op, rec'd/needed  blood thinner   . Depression   . Depression with anxiety   . Dysrhythmia   . Falls frequently    pt. reports that she was falling 3 times a day, has had PT at a facility for a while & now is getting home PT 2-3 times/ week   . Headache(784.0) 09/27/2011  . History of stress test    15-20 yrs.ago   . Hypotension   . Hypothyroidism   . Lumbar radiculopathy 09/26/2011  . Pneumonia    never been in hosp. for pneumonia   . Prediabetes   . Right hand fracture    Monday  . Shortness of breath   . Sleep apnea    doesn't use CPAP any longer, since weight loss   Past Surgical History:  Procedure Laterality Date  . ANTERIOR CERVICAL DECOMP/DISCECTOMY FUSION N/A 07/09/2013   Procedure: ANTERIOR CERVICAL DECOMPRESSION/DISCECTOMY FUSION 3 LEVELS;  Surgeon: Sinclair Ship, MD;  Location: [facility];  Service: Orthopedics;  Laterality: N/A;  Anterior cervical decompression fusion, cervical 3-4, cervical 4-5-, cervical 5-6 with instrumentation, allograft.  . APPENDECTOMY    . BACK SURGERY     2000  . BREAST EXCISIONAL BIOPSY     breast bxs on left x 2, breast bx  of right x 1-all benign  . EYE SURGERY     cataracts bilateral /w IOL  . HERNIA REPAIR     umbilical hernia- 3299  . JOINT REPLACEMENT  2006,2007   bilateral  . LAPAROSCOPIC OVARIAN CYSTECTOMY     not done by laparscopy-abdominal incision  . REPLACEMENT TOTAL KNEE BILATERAL    . TONSILLECTOMY     Social History:   reports that she quit smoking about 49 years ago. Her smoking use included cigarettes. She has a 10.00 pack-year smoking history. she has never used smokeless tobacco. She reports that she drinks about 0.6 oz of alcohol per week. She reports that she does not use drugs.  Family History  Problem Relation Age of Onset  .  Heart disease Father   . Malignant hyperthermia Father   . Arthritis Father   . Deep vein thrombosis Son   . Diabetes Son   . Obesity Son   . Depression Son   . Hypertension Son   . Parkinson's disease Mother   . Diabetes Daughter   . Hypertension Daughter   . Anxiety disorder Daughter   . Hyperlipidemia Son   . Hypertension Son   . Cancer Maternal Aunt     Medications:   Medication List        Accurate as of 11/16/17 11:03 AM. Always use your most recent med list.          benzonatate 100 MG capsule Commonly known as:  TESSALON Take 1 capsule (100 mg total) by mouth 2 (two) times daily as needed for cough.   Brexpiprazole 1 MG Tabs   busPIRone 15 MG tablet Commonly known as:  BUSPAR   CALCIUM CITRATE PO   clonazePAM 0.5 MG tablet Commonly known as:  KLONOPIN Take 1 tablet by mouth as needed in middle of the nite to go back to sleep   CRANBERRY EXTRACT PO   doxycycline 100 MG tablet Commonly known as:  VIBRA-TABS Take 1 tablet (100 mg total) by mouth 2 (two) times daily.   DULoxetine 60 MG capsule Commonly known as:  CYMBALTA Take 2 capsules (120 mg total) by mouth daily.   furosemide 40 MG tablet Commonly known as:  LASIX Take 0.5 tablets (20 mg total) by mouth daily.   levothyroxine 50 MCG tablet Commonly known as:  SYNTHROID, LEVOTHROID TAKE ONE TABLET BY MOUTH ONCE DAILY 30 MINUTES BEFORE BREAKFAST FOR THYROID   meloxicam 15 MG tablet Commonly known as:  MOBIC Take one tablet by mouth once daily   mirtazapine 15 MG tablet Commonly known as:  REMERON   MULTIVITAMIN PO   oxyCODONE-acetaminophen 10-325 MG tablet Commonly known as:  PERCOCET Take 1-2 tablets by mouth every 4-6 hours as needed for Arthritis pain   RESTASIS 0.05 % ophthalmic emulsion Generic drug:  cycloSPORINE   traZODone 100 MG tablet Commonly known as:  DESYREL   warfarin 5 MG tablet Commonly known as:  COUMADIN Take as directed by the anticoagulation clinic. If you  are unsure how to take this medication, talk to your nurse or doctor. Original instructions:  Take one tablet daily except 1/2 tablet on Monday and Friday as directed.        Physical Exam:  There were no vitals filed for this visit. There is no height or weight on file to calculate BMI.  Physical Exam  Constitutional: She is oriented to person, place, and time. She appears well-developed and well-nourished.  HENT:  Right Ear: External ear normal.  Left  Ear: External ear normal.  Cardiovascular: Normal rate, regular rhythm and normal heart sounds.  Pulmonary/Chest: Effort normal and breath sounds normal.  Neurological: She is alert and oriented to person, place, and time.  Skin: Skin is warm and dry.  Psychiatric: She has a normal mood and affect. Her behavior is normal. Judgment and thought content normal.    Labs reviewed: Basic Metabolic Panel: Recent Labs    03/01/17 0820 10/18/17 0949  NA 141 138  K 4.1 4.2  CL 103 99  CO2 30 28  GLUCOSE 87 108*  BUN 17 29*  CREATININE 1.02* 0.93*  CALCIUM 9.4 9.3  TSH 2.58  --    Liver Function Tests: Recent Labs    03/01/17 0820 10/18/17 0949  AST 19 15  ALT 9 8  ALKPHOS 54  --   BILITOT 0.5 0.4  PROT 6.4 6.6  ALBUMIN 4.0  --    No results for input(s): LIPASE, AMYLASE in the last 8760 hours. No results for input(s): AMMONIA in the last 8760 hours. CBC: Recent Labs    03/01/17 0820 10/18/17 0949  WBC 5.9 6.9  NEUTROABS 3,540 4,816  HGB 13.2 12.7  HCT 41.0 38.3  MCV 89.9 85.7  PLT 216 237   Lipid Panel: Recent Labs    03/01/17 0820  CHOL 186  HDL 50*  LDLCALC 108*  TRIG 139  CHOLHDL 3.7   TSH: Recent Labs    03/01/17 0820  TSH 2.58   A1C: Lab Results  Component Value Date   HGBA1C 5.3 10/18/2017     Assessment/Plan 1. Bronchitis Cont on doxycyline, still with cough and congestion. To cont doxycyline and supportive care at this time. Good hydration and nutrition encouraged.   2.  Hypercoagulable state due to atrial fibrillation (HCC) -conts on coumadin, INR 1.8- see number 3  3. Long term (current) use of anticoagulants INR 1.8 today. To take 1 tablet tonight (5 mg) then resume 1/2 tablet (2.5 mg)  -to keep follow up on Monday with [name] as scheduled   Next appt:  Takyah Ciaramitaro K. Harle Battiest  [facility] [phone] 8 am - 5 pm) [phone] (after hours)

## 2017-11-16 NOTE — Patient Instructions (Signed)
To take whole tablet today- 5 mg  Then resume 1/2 tablet-2.5 mg daily and keep follow up with Tivis Ringer for Monday  Cont doxycyline and mucinex DM twice daily Increase hydration

## 2017-11-19 ENCOUNTER — Encounter: Payer: Self-pay | Admitting: Pharmacotherapy

## 2017-11-19 ENCOUNTER — Ambulatory Visit (INDEPENDENT_AMBULATORY_CARE_PROVIDER_SITE_OTHER): Payer: Medicare Other | Admitting: Pharmacotherapy

## 2017-11-19 ENCOUNTER — Other Ambulatory Visit: Payer: Self-pay

## 2017-11-19 VITALS — BP 120/80 | HR 84 | Resp 12

## 2017-11-19 DIAGNOSIS — I480 Paroxysmal atrial fibrillation: Secondary | ICD-10-CM | POA: Diagnosis not present

## 2017-11-19 DIAGNOSIS — G894 Chronic pain syndrome: Secondary | ICD-10-CM

## 2017-11-19 LAB — POCT INR: INR: 1.3

## 2017-11-19 MED ORDER — OXYCODONE-ACETAMINOPHEN 10-325 MG PO TABS: ORAL_TABLET | ORAL | 0 refills | Status: DC

## 2017-11-19 NOTE — Progress Notes (Signed)
   Subjective:    Patient ID: Andrea [name], female    DOB: Mar 03, 1935, 82 y.o.   MRN: [id number]  HPI Last INR on 11/16/17 was low at 1.8 Coumadin was reduced in anticipation of antibiotic interaction with Doxycycline. Denies missed doses. Denies unusual bleeding or bruising. Denies CP, palpitations. Did have a fall without injury. Consistent with vitamin K intake.   Review of Systems  Constitutional: Negative for fever.  HENT: Positive for congestion. Negative for nosebleeds.   Respiratory: Positive for cough and wheezing.   Cardiovascular: Negative for chest pain.  Gastrointestinal: Negative for anal bleeding and blood in stool.  Genitourinary: Negative for hematuria.  Hematological: Does not bruise/bleed easily.       Objective:   Physical Exam  Constitutional: She is oriented to person, place, and time. She appears well-developed and well-nourished.  HENT:  Right Ear: External ear normal.  Left Ear: External ear normal.  Cardiovascular: Normal rate, regular rhythm and normal heart sounds.  Pulmonary/Chest: Effort normal. She has wheezes.  Neurological: She is alert and oriented to person, place, and time.  Skin: Skin is warm and dry.  Psychiatric: She has a normal mood and affect. Her behavior is normal. Judgment and thought content normal.  Vitals reviewed.   BP: 120/80  HR: 84      Assessment & Plan:  1.  INR below target 2-3 2.  Restart Coumadin 5mg  daily except 2.5mg  Mondays and Fridays 3.  INR in 2 weeks

## 2017-11-19 NOTE — Telephone Encounter (Signed)
[place] Database verified and compliance confirmed   

## 2017-12-03 ENCOUNTER — Ambulatory Visit (INDEPENDENT_AMBULATORY_CARE_PROVIDER_SITE_OTHER): Payer: Medicare Other | Admitting: Pharmacotherapy

## 2017-12-03 ENCOUNTER — Encounter: Payer: Self-pay | Admitting: Pharmacotherapy

## 2017-12-03 ENCOUNTER — Ambulatory Visit (INDEPENDENT_AMBULATORY_CARE_PROVIDER_SITE_OTHER): Payer: Medicare Other

## 2017-12-03 VITALS — BP 138/70 | HR 73 | Temp 98.8°F | Ht 61.0 in | Wt 200.0 lb

## 2017-12-03 DIAGNOSIS — I4891 Unspecified atrial fibrillation: Secondary | ICD-10-CM

## 2017-12-03 DIAGNOSIS — Z0000 Encounter for general adult medical examination without abnormal findings: Secondary | ICD-10-CM | POA: Diagnosis not present

## 2017-12-03 DIAGNOSIS — I480 Paroxysmal atrial fibrillation: Secondary | ICD-10-CM | POA: Diagnosis not present

## 2017-12-03 DIAGNOSIS — D6869 Other thrombophilia: Secondary | ICD-10-CM | POA: Diagnosis not present

## 2017-12-03 DIAGNOSIS — Z7901 Long term (current) use of anticoagulants: Secondary | ICD-10-CM | POA: Diagnosis not present

## 2017-12-03 DIAGNOSIS — Z598 Other problems related to housing and economic circumstances: Secondary | ICD-10-CM

## 2017-12-03 DIAGNOSIS — Z599 Problem related to housing and economic circumstances, unspecified: Secondary | ICD-10-CM

## 2017-12-03 LAB — POCT INR: INR: 1.5

## 2017-12-03 MED ORDER — WARFARIN SODIUM 5 MG PO TABS: ORAL_TABLET | ORAL | 1 refills | Status: DC

## 2017-12-03 MED ORDER — ZOSTER VAC RECOMB ADJUVANTED 50 MCG/0.5ML IM SUSR: 0.5000 mL | Freq: Once | INTRAMUSCULAR | 1 refills | Status: AC

## 2017-12-03 NOTE — Patient Instructions (Signed)
Andrea [name] , Thank you for taking time to come for your Medicare Wellness Visit. I appreciate your ongoing commitment to your health goals. Please review the following plan we discussed and let me know if I can assist you in the future.   Screening recommendations/referrals: Colonoscopy excluded, you are over age 81 Mammogram excluded, you are over age 12 Bone Density up to date Recommended yearly ophthalmology/optometry visit for glaucoma screening and checkup Recommended yearly dental visit for hygiene and checkup  Vaccinations: Influenza vaccine up to date. Due 2019 fall season Pneumococcal vaccine up to date Tdap vaccine up to date, due 11/27/2026 Shingles vaccine due, prescription sent to pharmacy    Advanced directives: Advance directive discussed with you today. I have provided a copy for you to complete at home and have notarized. Once this is complete please bring a copy in to our office so we can scan it into your chart.  Conditions/risks identified: none  Next appointment: Tyson Dense, RN 12/09/2018 @8 :30am   Preventive Care 71 Years and Older, Female Preventive care refers to lifestyle choices and visits with your health care provider that can promote health and wellness. What does preventive care include?  A yearly physical exam. This is also called an annual well check.  Dental exams once or twice a year.  Routine eye exams. Ask your health care provider how often you should have your eyes checked.  Personal lifestyle choices, including:  Daily care of your teeth and gums.  Regular physical activity.  Eating a healthy diet.  Avoiding tobacco and drug use.  Limiting alcohol use.  Practicing safe sex.  Taking low-dose aspirin every day.  Taking vitamin and mineral supplements as recommended by your health care provider. What happens during an annual well check? The services and screenings done by your health care provider during your annual well check will  depend on your age, overall health, lifestyle risk factors, and family history of disease. Counseling  Your health care provider may ask you questions about your:  Alcohol use.  Tobacco use.  Drug use.  Emotional well-being.  Home and relationship well-being.  Sexual activity.  Eating habits.  History of falls.  Memory and ability to understand (cognition).  Work and work Statistician.  Reproductive health. Screening  You may have the following tests or measurements:  Height, weight, and BMI.  Blood pressure.  Lipid and cholesterol levels. These may be checked every 5 years, or more frequently if you are over 60 years old.  Skin check.  Lung cancer screening. You may have this screening every year starting at age 56 if you have a 30-pack-year history of smoking and currently smoke or have quit within the past 15 years.  Fecal occult blood test (FOBT) of the stool. You may have this test every year starting at age 17.  Flexible sigmoidoscopy or colonoscopy. You may have a sigmoidoscopy every 5 years or a colonoscopy every 10 years starting at age 41.  Hepatitis C blood test.  Hepatitis B blood test.  Sexually transmitted disease (STD) testing.  Diabetes screening. This is done by checking your blood sugar (glucose) after you have not eaten for a while (fasting). You may have this done every 1-3 years.  Bone density scan. This is done to screen for osteoporosis. You may have this done starting at age 77.  Mammogram. This may be done every 1-2 years. Talk to your health care provider about how often you should have regular mammograms. Talk with your health  care provider about your test results, treatment options, and if necessary, the need for more tests. Vaccines  Your health care provider may recommend certain vaccines, such as:  Influenza vaccine. This is recommended every year.  Tetanus, diphtheria, and acellular pertussis (Tdap, Td) vaccine. You may need a  Td booster every 10 years.  Zoster vaccine. You may need this after age 20.  Pneumococcal 13-valent conjugate (PCV13) vaccine. One dose is recommended after age 72.  Pneumococcal polysaccharide (PPSV23) vaccine. One dose is recommended after age 84. Talk to your health care provider about which screenings and vaccines you need and how often you need them. This information is not intended to replace advice given to you by your health care provider. Make sure you discuss any questions you have with your health care provider. Document Released: 12/03/2015 Document Revised: 07/26/2016 Document Reviewed: 09/07/2015 Elsevier Interactive Patient Education  2017 [place] Prevention in the Home Falls can cause injuries. They can happen to people of all ages. There are many things you can do to make your home safe and to help prevent falls. What can I do on the outside of my home?  Regularly fix the edges of walkways and driveways and fix any cracks.  Remove anything that might make you trip as you walk through a door, such as a raised step or threshold.  Trim any bushes or trees on the path to your home.  Use bright outdoor lighting.  Clear any walking paths of anything that might make someone trip, such as rocks or tools.  Regularly check to see if handrails are loose or broken. Make sure that both sides of any steps have handrails.  Any raised decks and porches should have guardrails on the edges.  Have any leaves, snow, or ice cleared regularly.  Use sand or salt on walking paths during winter.  Clean up any spills in your garage right away. This includes oil or grease spills. What can I do in the bathroom?  Use night lights.  Install grab bars by the toilet and in the tub and shower. Do not use towel bars as grab bars.  Use non-skid mats or decals in the tub or shower.  If you need to sit down in the shower, use a plastic, non-slip stool.  Keep the floor dry. Clean  up any water that spills on the floor as soon as it happens.  Remove soap buildup in the tub or shower regularly.  Attach bath mats securely with double-sided non-slip rug tape.  Do not have throw rugs and other things on the floor that can make you trip. What can I do in the bedroom?  Use night lights.  Make sure that you have a light by your bed that is easy to reach.  Do not use any sheets or blankets that are too big for your bed. They should not hang down onto the floor.  Have a firm chair that has side arms. You can use this for support while you get dressed.  Do not have throw rugs and other things on the floor that can make you trip. What can I do in the kitchen?  Clean up any spills right away.  Avoid walking on wet floors.  Keep items that you use a lot in easy-to-reach places.  If you need to reach something above you, use a strong step stool that has a grab bar.  Keep electrical cords out of the way.  Do not use floor  polish or wax that makes floors slippery. If you must use wax, use non-skid floor wax.  Do not have throw rugs and other things on the floor that can make you trip. What can I do with my stairs?  Do not leave any items on the stairs.  Make sure that there are handrails on both sides of the stairs and use them. Fix handrails that are broken or loose. Make sure that handrails are as long as the stairways.  Check any carpeting to make sure that it is firmly attached to the stairs. Fix any carpet that is loose or worn.  Avoid having throw rugs at the top or bottom of the stairs. If you do have throw rugs, attach them to the floor with carpet tape.  Make sure that you have a light switch at the top of the stairs and the bottom of the stairs. If you do not have them, ask someone to add them for you. What else can I do to help prevent falls?  Wear shoes that:  Do not have high heels.  Have rubber bottoms.  Are comfortable and fit you well.  Are  closed at the toe. Do not wear sandals.  If you use a stepladder:  Make sure that it is fully opened. Do not climb a closed stepladder.  Make sure that both sides of the stepladder are locked into place.  Ask someone to hold it for you, if possible.  Clearly mark and make sure that you can see:  Any grab bars or handrails.  First and last steps.  Where the edge of each step is.  Use tools that help you move around (mobility aids) if they are needed. These include:  Canes.  Walkers.  Scooters.  Crutches.  Turn on the lights when you go into a dark area. Replace any light bulbs as soon as they burn out.  Set up your furniture so you have a clear path. Avoid moving your furniture around.  If any of your floors are uneven, fix them.  If there are any pets around you, be aware of where they are.  Review your medicines with your doctor. Some medicines can make you feel dizzy. This can increase your chance of falling. Ask your doctor what other things that you can do to help prevent falls. This information is not intended to replace advice given to you by your health care provider. Make sure you discuss any questions you have with your health care provider. Document Released: 09/02/2009 Document Revised: 04/13/2016 Document Reviewed: 12/11/2014 Elsevier Interactive Patient Education  2017 [employer].

## 2017-12-03 NOTE — Progress Notes (Signed)
   Subjective:    Patient ID: Andrea [name], female    DOB: 11-06-1935, 83 y.o.   MRN: [id number]  HPI Last INR on 11/19/17 was low at 1.3 Coumadin was increased to 5mg  QD except 2.5mg  M/F Denies missed doses Denies unusual bleeding or bruising. Denies CP, palpitations, falls Consistent with vitamin K intake   Review of Systems  HENT: Negative for nosebleeds.   Cardiovascular: Negative for chest pain and palpitations.  Gastrointestinal: Negative for anal bleeding and blood in stool.  Genitourinary: Negative for hematuria.  Hematological: Does not bruise/bleed easily.       Objective:   Physical Exam  Constitutional: She is oriented to person, place, and time. She appears well-developed and well-nourished.  HENT:  Right Ear: External ear normal.  Left Ear: External ear normal.  Cardiovascular: Normal rate, regular rhythm and normal heart sounds.  Pulmonary/Chest: Effort normal and breath sounds normal.  Neurological: She is alert and oriented to person, place, and time.  Skin: Skin is warm and dry.  Psychiatric: She has a normal mood and affect. Her behavior is normal. Judgment and thought content normal.  Vitals reviewed.   BP: 138/70  HR: 73  Wt: 200lb INR 1.5      Assessment & Plan:  1.  INR below goal 2-3 2.  Increase Coumadin 5mg  qD except 2.5mg  Fridays 3.  INR 4 weeks

## 2017-12-03 NOTE — Addendum Note (Signed)
Addended by: Tyson Dense E on: 12/03/2017 10:49 AM   Modules accepted: Orders

## 2017-12-03 NOTE — Progress Notes (Signed)
Subjective:   Andrea [name] is a 83 y.o. female who presents for Medicare Annual (Subsequent) preventive examination.   Last AWV-11/27/2016    Objective:     Vitals: BP 138/70 (BP Location: Left Arm, Patient Position: Sitting)   Pulse 73   Temp 98.8 F (37.1 C) (Oral)   Ht 5\' 1"  (1.549 m)   Wt 200 lb (90.7 kg)   SpO2 94%   BMI 37.79 kg/m   Body mass index is 37.79 kg/m.  Advanced Directives 12/03/2017 11/16/2017 11/14/2017 10/18/2017 10/16/2017 09/11/2017 07/30/2017  Does Patient Have a Medical Advance Directive? Yes Yes Yes Yes Yes Yes No  Type of Advance Directive Out of facility DNR (pink MOST or yellow form) Out of facility DNR (pink MOST or yellow form) Out of facility DNR (pink MOST or yellow form) Out of facility DNR (pink MOST or yellow form) Out of facility DNR (pink MOST or yellow form) Out of facility DNR (pink MOST or yellow form) Out of facility DNR (pink MOST or yellow form)  Does patient want to make changes to medical advance directive? Yes (MAU/Ambulatory/Procedural Areas - Information given) - - No - Patient declined - - -  Copy of [place] in [place]  Would patient like information on creating a medical advance directive? - - - - - - -  Pre-existing out of facility DNR order (yellow form or pink MOST form) Yellow form placed in chart (order not valid for inpatient use) Yellow form placed in chart (order not valid for inpatient use) Yellow form placed in chart (order not valid for inpatient use) Yellow form placed in chart (order not valid for inpatient use) Yellow form placed in chart (order not valid for inpatient use) Yellow form placed in chart (order not valid for inpatient use) Yellow form placed in chart (order not valid for inpatient use)    Tobacco Social History   Tobacco Use  Smoking Status Former Smoker  . Packs/day: 0.50  . Years: 20.00  . Pack years: 10.00  . Types: Cigarettes  . Last attempt to quit: 11/20/1968  .  Years since quitting: 49.0  Smokeless Tobacco Never Used  Tobacco Comment   smoked for approx. 10 years     Counseling given: Not Answered Comment: smoked for approx. 10 years   Clinical Intake:  Pre-visit preparation completed: No  Pain : No/denies pain     Nutritional Risks: None Diabetes: No  How often do you need to have someone help you when you read instructions, pamphlets, or other written materials from your doctor or pharmacy?: 1 - Never What is the last grade level you completed in school?: [place] Needed?: No  Information entered by :: Tyson Dense, RN  Past Medical History:  Diagnosis Date  . Anemia   . Anxiety   . Arthritis    hands, spine   . Blood transfusion   . Cervicalgia   . Constipation   . Cough    Wert-onset 08/2009, as of 2014- resolved   . Deep venous thrombosis ([place])    post op, rec'd/needed  blood thinner   . Depression   . Depression with anxiety   . Dysrhythmia   . Falls frequently    pt. reports that she was falling 3 times a day, has had PT at a facility for a while & now is getting home PT 2-3 times/ week   . Headache(784.0) 09/27/2011  . History  of stress test    15-20 yrs.ago   . Hypotension   . Hypothyroidism   . Lumbar radiculopathy 09/26/2011  . Pneumonia    never been in hosp. for pneumonia   . Prediabetes   . Right hand fracture    Monday  . Shortness of breath   . Sleep apnea    doesn't use CPAP any longer, since weight loss   Past Surgical History:  Procedure Laterality Date  . ANTERIOR CERVICAL DECOMP/DISCECTOMY FUSION N/A 07/09/2013   Procedure: ANTERIOR CERVICAL DECOMPRESSION/DISCECTOMY FUSION 3 LEVELS;  Surgeon: Sinclair Ship, MD;  Location: [facility];  Service: Orthopedics;  Laterality: N/A;  Anterior cervical decompression fusion, cervical 3-4, cervical 4-5-, cervical 5-6 with instrumentation, allograft.  . APPENDECTOMY    . BACK SURGERY     2000  . BREAST EXCISIONAL BIOPSY      breast bxs on left x 2, breast bx of right x 1-all benign  . EYE SURGERY     cataracts bilateral /w IOL  . HERNIA REPAIR     umbilical hernia- [id number]  . JOINT REPLACEMENT  2006,2007   bilateral  . LAPAROSCOPIC OVARIAN CYSTECTOMY     not done by laparscopy-abdominal incision  . REPLACEMENT TOTAL KNEE BILATERAL    . TONSILLECTOMY     Family History  Problem Relation Age of Onset  . Heart disease Father   . Malignant hyperthermia Father   . Arthritis Father   . Deep vein thrombosis Son   . Diabetes Son   . Obesity Son   . Depression Son   . Hypertension Son   . Parkinson's disease Mother   . Diabetes Daughter   . Hypertension Daughter   . Anxiety disorder Daughter   . Hyperlipidemia Son   . Hypertension Son   . Cancer Maternal Aunt    Social History   Socioeconomic History  . Marital status: Single    Spouse name: None  . Number of children: 3  . Years of education: College  . Highest education level: None  Social Needs  . Financial resource strain: Not very hard  . Food insecurity - worry: Never true  . Food insecurity - inability: Never true  . Transportation needs - medical: No  . Transportation needs - non-medical: No  Occupational History  . Occupation: Retired Therapist, sports  Tobacco Use  . Smoking status: Former Smoker    Packs/day: 0.50    Years: 20.00    Pack years: 10.00    Types: Cigarettes    Last attempt to quit: 11/20/1968    Years since quitting: 49.0  . Smokeless tobacco: Never Used  . Tobacco comment: smoked for approx. 10 years  Substance and Sexual Activity  . Alcohol use: Yes    Alcohol/week: 0.6 oz    Types: 1 Glasses of wine per week    Comment: rare  . Drug use: No  . Sexual activity: No  Other Topics Concern  . None  Social History Narrative   Patient is single and lives alone.   Patient is retired.   Patient has a college education.   Patient has three adult children   Patient is right-handed.   Patient drinks 3-4 cups of caffeine (soda and  coffee) daily.   Walks with walker    Outpatient Encounter Medications as of 12/03/2017  Medication Sig  . Brexpiprazole 1 MG TABS Take 1 tablet by mouth at bedtime.  . busPIRone (BUSPAR) 15 MG tablet Take 15 mg by mouth daily.  [name]  CALCIUM CITRATE PO Take 600 mg by mouth 2 (two) times daily.  . clonazePAM (KLONOPIN) 0.5 MG tablet Take 1 tablet by mouth as needed in middle of the nite to go back to sleep  . CRANBERRY EXTRACT PO Take 2 tablets by mouth daily.  . DULoxetine (CYMBALTA) 60 MG capsule Take 2 capsules (120 mg total) by mouth daily.  . furosemide (LASIX) 40 MG tablet Take 0.5 tablets (20 mg total) by mouth daily.  [name] levothyroxine (SYNTHROID, LEVOTHROID) 50 MCG tablet TAKE ONE TABLET BY MOUTH ONCE DAILY 30 MINUTES BEFORE BREAKFAST FOR THYROID  . meloxicam (MOBIC) 15 MG tablet Take one tablet by mouth once daily  . mirtazapine (REMERON) 15 MG tablet Take 7.5 mg by mouth at bedtime.  . Multiple Vitamins-Minerals (MULTIVITAMIN PO) Take 1 tablet by mouth.  . oxyCODONE-acetaminophen (PERCOCET) 10-325 MG tablet Take 1 tab every 4 hrs as needed for moderate pain and 2 tabs every 4 hrs as needed for severe pain  . RESTASIS 0.05 % ophthalmic emulsion PLACE 1 DROP INTO BOTH EYES TWICE A DAY  . traZODone (DESYREL) 100 MG tablet Take 2 tablets by mouth at bedtime  . warfarin (COUMADIN) 5 MG tablet Take one tablet daily except 1/2 tablet on Monday and Friday as directed.  . [DISCONTINUED] benzonatate (TESSALON) 100 MG capsule Take 1 capsule (100 mg total) by mouth 2 (two) times daily as needed for cough.  . [DISCONTINUED] doxycycline (VIBRA-TABS) 100 MG tablet Take 1 tablet (100 mg total) by mouth 2 (two) times daily.   No facility-administered encounter medications on file as of 12/03/2017.     Activities of Daily Living In your present state of health, do you have any difficulty performing the following activities: 12/03/2017  Hearing? Y  Vision? Y  Comment Will go to eye doctor to get new  glasses soon  Difficulty concentrating or making decisions? Y  Walking or climbing stairs? Y  Dressing or bathing? N  Doing errands, shopping? Y  Preparing Food and eating ? N  Using the Toilet? N  In the past six months, have you accidently leaked urine? Y  Do you have problems with loss of bowel control? N  Managing your Medications? Y  Managing your Finances? Y  Housekeeping or managing your Housekeeping? Y  Some recent data might be hidden    Patient Care Team: Gayland Curry, DO as PCP - General (Geriatric Medicine) Tanda Rockers, MD as Consulting Physician (Pulmonary Disease)    Assessment:   This is a routine wellness examination for [facility].  Exercise Activities and Dietary recommendations Current Exercise Habits: The patient does not participate in regular exercise at present, Exercise limited by: None identified  Goals    . Weight (lb) < 150 lb (68 kg)     Starting 11/27/2016, I will attempt to decrease my weight by 50 lbs to reach a goal weight of 150 lb.     . Weight (lb) < 170 lb (77.1 kg)     Pt will start going for walks to lose weight       Fall Risk Fall Risk  12/03/2017 11/16/2017 11/14/2017 10/18/2017 10/16/2017  Falls in the past year? Yes No Yes No No  Number falls in past yr: 2 or more - 1 - -  Comment - - - - -  Injury with Fall? No - - - -  Risk for fall due to : - - - - -  Follow up - - - - -  Is the patient's home free of loose throw rugs in walkways, pet beds, electrical cords, etc?   yes      Grab bars in the bathroom? yes      Handrails on the stairs?   yes      Adequate lighting?   yes  Timed Get Up and Go performed: 39 seconds, fall risk  Depression Screen PHQ 2/9 Scores 12/03/2017 10/18/2017 06/11/2017 03/05/2017  PHQ - 2 Score 0 0 0 0  PHQ- 9 Score - - - -     Cognitive Function MMSE - Mini Mental State Exam 11/27/2016 03/05/2014  Orientation to time 5 5  Orientation to Place 5 5  Registration 3 3  Attention/ Calculation 5 5    Recall 3 3  Language- name 2 objects 2 2  Language- repeat 1 1  Language- follow 3 step command 3 3  Language- read & follow direction 1 1  Write a sentence 1 1  Copy design 1 1  Total score 30 30        Immunization History  Administered Date(s) Administered  . Influenza Split 08/14/2013  . Influenza Whole 08/20/2009  . Influenza, High Dose Seasonal PF 09/11/2017  . Influenza,inj,Quad PF,6+ Mos 08/04/2014, 09/06/2015, 07/10/2016  . Pneumococcal Conjugate-13 01/14/2015  . Pneumococcal Polysaccharide-23 07/11/2013  . Td 11/20/2005, 11/27/2016    Qualifies for Shingles Vaccine? Yes, educated and prescription sent to pharmacy  Screening Tests Health Maintenance  Topic Date Due  . OPHTHALMOLOGY EXAM  06/16/1945  . URINE MICROALBUMIN  11/27/2017  . HEMOGLOBIN A1C  04/17/2018  . FOOT EXAM  10/18/2018  . TETANUS/TDAP  11/27/2026  . INFLUENZA VACCINE  Completed  . DEXA SCAN  Completed  . PNA vac Low Risk Adult  Completed    Cancer Screenings: Lung: Low Dose CT Chest recommended if Age 76-80 years, 30 pack-year currently smoking OR have quit w/in 15years. Patient does not qualify. Breast:  Up to date on Mammogram? Yes   Up to date of Bone Density/Dexa? Yes Colorectal: up to date  Additional Screenings:  Hepatitis B/HIV/Syphillis:Declined Hepatitis C Screening: Declined     Plan:    I have personally reviewed and addressed the Medicare Annual Wellness questionnaire and have noted the following in the patient's chart:  A. Medical and social history B. Use of alcohol, tobacco or illicit drugs  C. Current medications and supplements D. Functional ability and status E.  Nutritional status F.  Physical activity G. Advance directives H. List of other physicians I.  Hospitalizations, surgeries, and ER visits in previous 12 months J.  [place] to include hearing, vision, cognitive, depression L. Referrals and appointments - none  In addition, I have  reviewed and discussed with patient certain preventive protocols, quality metrics, and best practice recommendations. A written personalized care plan for preventive services as well as general preventive health recommendations were provided to patient.  See attached scanned questionnaire for additional information.   Signed,   Tyson Dense, RN Nurse Health Advisor   Quick Notes   Health Maintenance: Urine microalbumin due. Pt is due for eye exam and stated she will make it this month.     Abnormal Screen: MMSE 27/30. Passed clock drawing     Patient Concerns: None     Nurse Concerns: None

## 2017-12-06 ENCOUNTER — Telehealth: Payer: Self-pay | Admitting: *Deleted

## 2017-12-06 NOTE — Telephone Encounter (Signed)
Andrea [name], caregiver called to confirm Coumadin dosage. I reviewed Andrea [name]'s last OV note on 12/03/17 and dosage is Coumadin 5mg  daily except 2.5mg  on Friday. Recheck in 4 weeks. Caregiver Agreed.

## 2017-12-07 ENCOUNTER — Other Ambulatory Visit: Payer: Self-pay | Admitting: *Deleted

## 2017-12-07 MED ORDER — CLONAZEPAM 0.5 MG PO TABS: ORAL_TABLET | ORAL | 0 refills | Status: DC

## 2017-12-07 NOTE — Telephone Encounter (Signed)
Patient caregiver called and stated that patient is waiting for her mail order to get there and she is out of her medication. Caregiver stated that they told her it was going to take 10-15 days without counting weekend. Called and spoke with pharmacist.

## 2017-12-31 ENCOUNTER — Ambulatory Visit (INDEPENDENT_AMBULATORY_CARE_PROVIDER_SITE_OTHER): Payer: Medicare Other | Admitting: Pharmacotherapy

## 2017-12-31 VITALS — BP 122/70 | HR 64 | Ht 61.0 in | Wt 196.6 lb

## 2017-12-31 DIAGNOSIS — I4891 Unspecified atrial fibrillation: Secondary | ICD-10-CM

## 2017-12-31 DIAGNOSIS — D6869 Other thrombophilia: Secondary | ICD-10-CM

## 2017-12-31 DIAGNOSIS — I480 Paroxysmal atrial fibrillation: Secondary | ICD-10-CM

## 2017-12-31 LAB — POCT INR: INR: 1.5

## 2017-12-31 MED ORDER — MIRABEGRON ER 25 MG PO TB24: 25.0000 mg | ORAL_TABLET | Freq: Every day | ORAL | 5 refills | Status: DC

## 2017-12-31 NOTE — Patient Instructions (Signed)
Take 1 & 1/2 tablets of Coumadin 5mg  today, then start Coumadin 5mg  daily

## 2017-12-31 NOTE — Progress Notes (Signed)
   Subjective:    Patient ID: Andrea [name], female    DOB: 06-01-1935, 83 y.o.   MRN: [id number]  HPI  Last INR on 12/03/17 was low at 1.5 Coumadin dose is 5mg  QD except 2.5mg  Fridays Denies missed doses. Has increased vitamin K intake Denies unusual bleeding or bruising Denies CP, palpitations, falls  Review of Systems  HENT: Negative for nosebleeds.   Cardiovascular: Negative for chest pain and palpitations.  Gastrointestinal: Negative for anal bleeding and blood in stool.  Hematological: Does not bruise/bleed easily.       Objective:   Physical Exam  Constitutional: She is oriented to person, place, and time. She appears well-developed and well-nourished.  HENT:  Right Ear: External ear normal.  Left Ear: External ear normal.  Cardiovascular: Normal rate, regular rhythm and normal heart sounds.  Pulmonary/Chest: Effort normal and breath sounds normal.  Neurological: She is alert and oriented to person, place, and time.  Skin: Skin is warm and dry.  Psychiatric: She has a normal mood and affect. Her behavior is normal. Judgment and thought content normal.  Vitals reviewed.    BP: 122/70  HR: 64  Wt:196 INR 1.5     Assessment & Plan:  1.  INR below goal 2-3 2.  Today take booster dose of Coumadin 7.5mg  3.  Then start Coumadin 5mg  QD 4.  RTC 4 weeks

## 2018-01-02 ENCOUNTER — Encounter: Payer: Self-pay | Admitting: Internal Medicine

## 2018-01-28 ENCOUNTER — Encounter: Payer: Self-pay | Admitting: Pharmacotherapy

## 2018-01-28 ENCOUNTER — Other Ambulatory Visit: Payer: Self-pay

## 2018-01-28 ENCOUNTER — Ambulatory Visit (INDEPENDENT_AMBULATORY_CARE_PROVIDER_SITE_OTHER): Payer: Medicare Other | Admitting: Pharmacotherapy

## 2018-01-28 VITALS — BP 118/76 | HR 65 | Ht 61.0 in | Wt 198.0 lb

## 2018-01-28 DIAGNOSIS — I480 Paroxysmal atrial fibrillation: Secondary | ICD-10-CM

## 2018-01-28 DIAGNOSIS — G894 Chronic pain syndrome: Secondary | ICD-10-CM

## 2018-01-28 DIAGNOSIS — D6869 Other thrombophilia: Secondary | ICD-10-CM

## 2018-01-28 DIAGNOSIS — Z7901 Long term (current) use of anticoagulants: Secondary | ICD-10-CM

## 2018-01-28 DIAGNOSIS — M5416 Radiculopathy, lumbar region: Secondary | ICD-10-CM

## 2018-01-28 DIAGNOSIS — I4891 Unspecified atrial fibrillation: Secondary | ICD-10-CM

## 2018-01-28 LAB — POCT INR: INR: 2.2

## 2018-01-28 NOTE — Progress Notes (Signed)
   Subjective:    Patient ID: Andrea [name], female    DOB: [birth year], 83 y.o.   MRN: [id number]  HPI Last INR on 12/31/17 was low at 1.5 Current coumadin dose 5mg  QD Denies CP, palpitations, falls Denies missed doses. Denies unusual bleeding or bruising Consistent with vitamin K intake   Review of Systems  HENT: Negative for nosebleeds.   Cardiovascular: Negative for chest pain and palpitations.  Gastrointestinal: Negative for anal bleeding and blood in stool.  Genitourinary: Negative for hematuria.  Hematological: Does not bruise/bleed easily.       Objective:   Physical Exam  Constitutional: She is oriented to person, place, and time. She appears well-developed and well-nourished.  HENT:  Right Ear: External ear normal.  Left Ear: External ear normal.  Cardiovascular: Normal rate, regular rhythm and normal heart sounds.  Pulmonary/Chest: Effort normal and breath sounds normal.  Neurological: She is alert and oriented to person, place, and time.  Skin: Skin is warm and dry.  Psychiatric: She has a normal mood and affect. Her behavior is normal. Judgment and thought content normal.  Vitals reviewed.   BP  118/76  HR: 65  Wt:  198lb INR 2.2      Assessment & Plan:  1.  INR at goal 2-3 2.  Continue Coumadin 5mg  QD 3.  INR in 1 month

## 2018-01-28 NOTE — Telephone Encounter (Signed)
Received return call from [place], patient will need a new referral for PT. Referral order pending  Dr.Reed please complete

## 2018-01-28 NOTE — Telephone Encounter (Signed)
Patient was in office to see Oretha Ellis today and requested refill on Percocet.  [place] [facility] verified and compliance confirmed   Patient also requested referral to [facility] for PT. Patient c/o gait/blance concerns. Patient seen Ortho 1 year ago for therapy.  I called [facility] @ [phone] to inquire if patient needs a new referral or if she can call to set up, awaiting return call.  Dr.Reed please advise on refill request

## 2018-01-29 NOTE — Telephone Encounter (Signed)
Patient caregiver, [name] is calling stating that they needed a Rx sent to the pharmacy for patient's pain medication. Stated she requested yesterday but pharmacy has not received this yet. [place] Verified LR 11/19/2017 Pended Rx and sent to Dr. Mariea Clonts for approval.

## 2018-01-29 NOTE — Addendum Note (Signed)
Addended by: Rafael Bihari A on: 01/29/2018 02:47 PM   Modules accepted: Orders

## 2018-01-30 MED ORDER — OXYCODONE-ACETAMINOPHEN 10-325 MG PO TABS: ORAL_TABLET | ORAL | 0 refills | Status: DC

## 2018-01-30 NOTE — Telephone Encounter (Signed)
Pt was not seen by me.  Was seen by [facility] who cannot fill the Rx.  I just sent it.

## 2018-01-30 NOTE — Telephone Encounter (Signed)
Patient aware rx sent  

## 2018-02-04 ENCOUNTER — Other Ambulatory Visit: Payer: Self-pay | Admitting: *Deleted

## 2018-02-04 DIAGNOSIS — I480 Paroxysmal atrial fibrillation: Secondary | ICD-10-CM

## 2018-02-04 DIAGNOSIS — F341 Dysthymic disorder: Secondary | ICD-10-CM | POA: Diagnosis not present

## 2018-02-04 DIAGNOSIS — F331 Major depressive disorder, recurrent, moderate: Secondary | ICD-10-CM | POA: Diagnosis not present

## 2018-02-04 DIAGNOSIS — F411 Generalized anxiety disorder: Secondary | ICD-10-CM | POA: Diagnosis not present

## 2018-02-04 MED ORDER — FUROSEMIDE 40 MG PO TABS: 20.0000 mg | ORAL_TABLET | Freq: Every day | ORAL | 1 refills | Status: DC

## 2018-02-04 NOTE — Telephone Encounter (Signed)
[facility] 

## 2018-02-13 ENCOUNTER — Telehealth: Payer: Self-pay

## 2018-02-13 DIAGNOSIS — R269 Unspecified abnormalities of gait and mobility: Secondary | ICD-10-CM

## 2018-02-13 DIAGNOSIS — R260 Ataxic gait: Secondary | ICD-10-CM | POA: Diagnosis not present

## 2018-02-13 DIAGNOSIS — R2681 Unsteadiness on feet: Secondary | ICD-10-CM | POA: Diagnosis not present

## 2018-02-13 DIAGNOSIS — M5416 Radiculopathy, lumbar region: Secondary | ICD-10-CM

## 2018-02-13 DIAGNOSIS — R2689 Other abnormalities of gait and mobility: Secondary | ICD-10-CM

## 2018-02-13 NOTE — Telephone Encounter (Signed)
Referral order entered

## 2018-02-13 NOTE — Telephone Encounter (Signed)
Andrea [name]'s fax is [phone]  Phone: [phone]

## 2018-02-13 NOTE — Telephone Encounter (Signed)
Referral order faxed

## 2018-02-13 NOTE — Telephone Encounter (Signed)
Andrea [name] with [facility] called to ask that PT for gait/balance be added to referral. Pt is in their office today but the referral states only lumbar radiculopathy. A new referral must be sent that states lumbar radiculopathy and gait/balance training.    Is it ok to send corrected referral?

## 2018-02-14 ENCOUNTER — Encounter: Payer: Self-pay | Admitting: Internal Medicine

## 2018-02-14 ENCOUNTER — Ambulatory Visit (INDEPENDENT_AMBULATORY_CARE_PROVIDER_SITE_OTHER): Payer: Medicare Other | Admitting: Internal Medicine

## 2018-02-14 VITALS — BP 120/70 | HR 75 | Temp 99.5°F | Ht 61.0 in | Wt 198.0 lb

## 2018-02-14 DIAGNOSIS — M5416 Radiculopathy, lumbar region: Secondary | ICD-10-CM | POA: Diagnosis not present

## 2018-02-14 DIAGNOSIS — M79675 Pain in left toe(s): Secondary | ICD-10-CM

## 2018-02-14 DIAGNOSIS — I480 Paroxysmal atrial fibrillation: Secondary | ICD-10-CM

## 2018-02-14 DIAGNOSIS — Z7901 Long term (current) use of anticoagulants: Secondary | ICD-10-CM

## 2018-02-14 DIAGNOSIS — I4891 Unspecified atrial fibrillation: Secondary | ICD-10-CM

## 2018-02-14 DIAGNOSIS — R269 Unspecified abnormalities of gait and mobility: Secondary | ICD-10-CM | POA: Diagnosis not present

## 2018-02-14 DIAGNOSIS — R739 Hyperglycemia, unspecified: Secondary | ICD-10-CM

## 2018-02-14 DIAGNOSIS — D6869 Other thrombophilia: Secondary | ICD-10-CM | POA: Diagnosis not present

## 2018-02-14 DIAGNOSIS — M79674 Pain in right toe(s): Secondary | ICD-10-CM

## 2018-02-14 DIAGNOSIS — R2689 Other abnormalities of gait and mobility: Secondary | ICD-10-CM

## 2018-02-14 LAB — HM DIABETES FOOT EXAM

## 2018-02-14 NOTE — Progress Notes (Signed)
Location:  [facility] clinic Provider:  Tiffany L. Mariea Clonts, D.O., C.M.D.  Code Status: DNR  Goals of Care:  Advanced Directives 02/14/2018  Does Patient Have a Medical Advance Directive? Yes  Type of Advance Directive Out of facility DNR (pink MOST or yellow form)  Does patient want to make changes to medical advance directive? No - Patient declined  Copy of [place] in Chart? -  Would patient like information on creating a medical advance directive? -  Pre-existing out of facility DNR order (yellow form or pink MOST form) Yellow form placed in chart (order not valid for inpatient use)     Chief Complaint  Patient presents with  . Medical Management of Chronic Issues    87mth follow-up    HPI: Patient is a 83 y.o. female seen today for medical management of chronic diseases.    Concerned about her toes on both feet, about 6 months ago it started. She notes they feel like plastic when she moves them. She had discussed this at an early appt and she was told to monitor for changes. So as noticed does not hurt or cause discomfort when she is just sitting or keeping her toes/foot still. She has started to experience this pain at night more. In the middle of the night her pain level is 8/10 ans it wakes her up. Today in the office she is not bothered by it. She denies radiation into her foot, ankle or up her leg. She denies any pain that radiates down her leg as well. She has some hammer toes on one foot and some nail fungus- per daughter. She has not see a podiatrist about this.   Walking continues to be difficulty but she is going to PT for this, she just started this yesterday. She needs another script for this today. There is unknown etiology of this walking trouble. She had her circulation checked and it was good.     Past Medical History:  Diagnosis Date  . Anemia   . Anxiety   . Arthritis    hands, spine   . Blood transfusion   . Cervicalgia   . Constipation   .  Cough    Wert-onset 08/2009, as of 2014- resolved   . Deep venous thrombosis ([place])    post op, rec'd/needed  blood thinner   . Depression   . Depression with anxiety   . Dysrhythmia   . Falls frequently    pt. reports that she was falling 3 times a day, has had PT at a facility for a while & now is getting home PT 2-3 times/ week   . Headache(784.0) 09/27/2011  . History of stress test    15-20 yrs.ago   . Hypotension   . Hypothyroidism   . Lumbar radiculopathy 09/26/2011  . Pneumonia    never been in hosp. for pneumonia   . Prediabetes   . Right hand fracture    Monday  . Shortness of breath   . Sleep apnea    doesn't use CPAP any longer, since weight loss    Past Surgical History:  Procedure Laterality Date  . ANTERIOR CERVICAL DECOMP/DISCECTOMY FUSION N/A 07/09/2013   Procedure: ANTERIOR CERVICAL DECOMPRESSION/DISCECTOMY FUSION 3 LEVELS;  Surgeon: Sinclair Ship, MD;  Location: [facility];  Service: Orthopedics;  Laterality: N/A;  Anterior cervical decompression fusion, cervical 3-4, cervical 4-5-, cervical 5-6 with instrumentation, allograft.  . APPENDECTOMY    . BACK SURGERY     2000  .  BREAST EXCISIONAL BIOPSY     breast bxs on left x 2, breast bx of right x 1-all benign  . EYE SURGERY     cataracts bilateral /w IOL  . HERNIA REPAIR     umbilical hernia- 1324  . JOINT REPLACEMENT  2006,2007   bilateral  . LAPAROSCOPIC OVARIAN CYSTECTOMY     not done by laparscopy-abdominal incision  . REPLACEMENT TOTAL KNEE BILATERAL    . TONSILLECTOMY      Allergies  Allergen Reactions  . Clindamycin Hcl Other (See Comments)    REACTION: swelling, pt. Reports that she had a 16 lb. weightgain in one day    Outpatient Encounter Medications as of 02/14/2018  Medication Sig  . Brexpiprazole 1 MG TABS Take 1 tablet by mouth at bedtime.  . busPIRone (BUSPAR) 15 MG tablet Take 15 mg by mouth daily.  [name] CALCIUM CITRATE PO Take 600 mg by mouth 2 (two) times daily.  . clonazePAM  (KLONOPIN) 0.5 MG tablet Take 1 tablet by mouth as needed in middle of the nite to go back to sleep  . CRANBERRY EXTRACT PO Take 2 tablets by mouth daily.  . DULoxetine (CYMBALTA) 60 MG capsule Take 2 capsules (120 mg total) by mouth daily.  . furosemide (LASIX) 40 MG tablet Take 0.5 tablets (20 mg total) by mouth daily.  [name] levothyroxine (SYNTHROID, LEVOTHROID) 50 MCG tablet TAKE ONE TABLET BY MOUTH ONCE DAILY 30 MINUTES BEFORE BREAKFAST FOR THYROID  . meloxicam (MOBIC) 15 MG tablet Take one tablet by mouth once daily  . mirabegron ER (MYRBETRIQ) 25 MG TB24 tablet Take 1 tablet (25 mg total) by mouth daily.  . mirtazapine (REMERON) 15 MG tablet Take 7.5 mg by mouth at bedtime.  . Multiple Vitamins-Minerals (MULTIVITAMIN PO) Take 1 tablet by mouth.  . oxyCODONE-acetaminophen (PERCOCET) 10-325 MG tablet Take 1 tab every 4 hrs as needed for moderate pain and 2 tabs every 4 hrs as needed for severe pain  . traZODone (DESYREL) 100 MG tablet Take 2 tablets by mouth at bedtime  . warfarin (COUMADIN) 5 MG tablet Take one tablet daily except 1/2 tablet onFriday as directed.   No facility-administered encounter medications on file as of 02/14/2018.     Review of Systems:  Review of Systems  Constitutional: Positive for malaise/fatigue. Negative for chills and fever.  HENT: Positive for hearing loss.        Hearing aids  Eyes: Negative for blurred vision.       Glasses  Respiratory: Negative for cough and shortness of breath.   Cardiovascular: Negative for chest pain, palpitations and leg swelling.       She has afib and can tell when this happens  Gastrointestinal: Negative for constipation, diarrhea, heartburn, nausea and vomiting.  Genitourinary: Negative for frequency and urgency.  Musculoskeletal: Positive for joint pain. Negative for falls.       Toes  Neurological: Positive for weakness. Negative for dizziness and headaches.  Psychiatric/Behavioral: Positive for memory loss. Negative for  depression. The patient has insomnia. The patient is not nervous/anxious.     Health Maintenance  Topic Date Due  . OPHTHALMOLOGY EXAM  06/16/1945  . URINE MICROALBUMIN  11/27/2017  . HEMOGLOBIN A1C  04/17/2018  . FOOT EXAM  10/18/2018  . TETANUS/TDAP  11/27/2026  . INFLUENZA VACCINE  Completed  . DEXA SCAN  Completed  . PNA vac Low Risk Adult  Completed    Physical Exam: Vitals:   02/14/18 0847  BP: 120/70  Pulse:  75  Temp: 99.5 F (37.5 C)  TempSrc: Oral  SpO2: 96%  Weight: 198 lb (89.8 kg)   Body mass index is 37.41 kg/m. Physical Exam  Constitutional: She is oriented to person, place, and time. She appears well-developed and well-nourished.  HENT:  Hearing aids in place   Eyes:  glasses  Neck: Normal range of motion. Neck supple.  Cardiovascular: Normal rate, regular rhythm, normal heart sounds and intact distal pulses.  Pulses:      Dorsalis pedis pulses are 2+ on the right side, and 2+ on the left side.       Posterior tibial pulses are 2+ on the right side, and 2+ on the left side.  Pulmonary/Chest: Effort normal and breath sounds normal.  Abdominal: Soft. Bowel sounds are normal.  Musculoskeletal: Normal range of motion.       Right knee: Tenderness found.       Left knee: Tenderness found.       Right foot: There is deformity.       Left foot: There is normal range of motion and no deformity.  Right second toe and third toe hammer toes with redness of second joint.  She has tough/callus spots at the very tip of her toes. Tender to touch these sites.  Pulses are intact bilaterally.    Feet:  Right Foot:  Skin Integrity: Positive for erythema, callus and dry skin. Negative for skin breakdown.  Left Foot:  Skin Integrity: Positive for callus and dry skin.  Neurological: She is alert and oriented to person, place, and time. She has normal strength.  Balance is poor, slow with standing and walking  Skin: Skin is warm and dry. Capillary refill takes less  than 2 seconds.  Psychiatric: She has a normal mood and affect. Her behavior is normal. Judgment and thought content normal.  Vitals reviewed.   Labs reviewed: Basic Metabolic Panel: Recent Labs    03/01/17 0820 10/18/17 0949  NA 141 138  K 4.1 4.2  CL 103 99  CO2 30 28  GLUCOSE 87 108*  BUN 17 29*  CREATININE 1.02* 0.93*  CALCIUM 9.4 9.3  TSH 2.58  --    Liver Function Tests: Recent Labs    03/01/17 0820 10/18/17 0949  AST 19 15  ALT 9 8  ALKPHOS 54  --   BILITOT 0.5 0.4  PROT 6.4 6.6  ALBUMIN 4.0  --    No results for input(s): LIPASE, AMYLASE in the last 8760 hours. No results for input(s): AMMONIA in the last 8760 hours. CBC: Recent Labs    03/01/17 0820 10/18/17 0949  WBC 5.9 6.9  NEUTROABS 3,540 4,816  HGB 13.2 12.7  HCT 41.0 38.3  MCV 89.9 85.7  PLT 216 237   Lipid Panel: Recent Labs    03/01/17 0820  CHOL 186  HDL 50*  LDLCALC 108*  TRIG 139  CHOLHDL 3.7   Lab Results  Component Value Date   HGBA1C 5.3 10/18/2017    Procedures since last visit: No results found.  Assessment/Plan  1. Lumbar radiculopathy She has worsen of this. She is on cymbalta and on prn percocet as needed as well as mobic. Will continue this regimen  2. Abnormal gait She has demonstrated gait disturbance and balance as well. She has been sent to PT for this. Will await their recommendations. Advised to continue to use cane and move with intention and to practice the exercises that PT gives to her.   3. Balance problems  See 2  4. Hypercoagulable state due to atrial fibrillation ([place]) She has history of a fib, and is reg reg today, for this she is maintained on coumadin therapy. At goal INR 2.2 - CBC with Differential/Platelet; Future  5. Paroxysmal A-fib ([facility]) Regular today at visit. Not on rate control medications. Is on coumadin and sees [name] at office. Recent INR was 2.2. Goal is 2-3.  - CBC with Differential/Platelet; Future  6. Long term (current)  use of anticoagulants On Coumadin, at goal INR 2.2  7. Pain of toes of both feet She continues to have pain in toes. Recommended her to see podiatry for this and maintenance of nails.   8. Hyperglycemia She is managed by diet for this. Will continue to monitor this.   - COMPLETE METABOLIC PANEL WITH GFR; Future - Hemoglobin A1c; Future   Labs/tests ordered:  Orders Placed This Encounter  Procedures  . CBC with Differential/Platelet    Standing Status:   Future    Standing Expiration Date:   10/17/2018  . COMPLETE METABOLIC PANEL WITH GFR    Standing Status:   Future    Standing Expiration Date:   10/17/2018  . Hemoglobin A1c    Standing Status:   Future    Standing Expiration Date:   10/17/2018     Next appt:  02/25/2018  Karen Kays, RN, DNP Student Ger[employer]s [facility] [phone], [place] [address] Cell Phone (Mon-Fri 8am-5pm):  [phone] On Call:  [phone] & follow prompts after 5pm & weekends Office Phone:  [phone] Office Fax:  [phone]

## 2018-02-21 DIAGNOSIS — R2681 Unsteadiness on feet: Secondary | ICD-10-CM | POA: Diagnosis not present

## 2018-02-21 DIAGNOSIS — R260 Ataxic gait: Secondary | ICD-10-CM | POA: Diagnosis not present

## 2018-02-25 ENCOUNTER — Ambulatory Visit (INDEPENDENT_AMBULATORY_CARE_PROVIDER_SITE_OTHER): Payer: Medicare Other | Admitting: Pharmacotherapy

## 2018-02-25 ENCOUNTER — Encounter: Payer: Self-pay | Admitting: Pharmacotherapy

## 2018-02-25 VITALS — BP 124/78 | HR 78 | Temp 98.6°F | Ht 61.0 in | Wt 198.0 lb

## 2018-02-25 DIAGNOSIS — Z7901 Long term (current) use of anticoagulants: Secondary | ICD-10-CM | POA: Diagnosis not present

## 2018-02-25 DIAGNOSIS — I480 Paroxysmal atrial fibrillation: Secondary | ICD-10-CM | POA: Diagnosis not present

## 2018-02-25 DIAGNOSIS — R2681 Unsteadiness on feet: Secondary | ICD-10-CM | POA: Diagnosis not present

## 2018-02-25 DIAGNOSIS — R260 Ataxic gait: Secondary | ICD-10-CM | POA: Diagnosis not present

## 2018-02-25 LAB — POCT INR: INR: 2

## 2018-02-25 NOTE — Patient Instructions (Signed)
INR 2.0 Continue Coumadin 5mg  daily

## 2018-02-25 NOTE — Progress Notes (Signed)
   Subjective:    Patient ID: Andrea [name], female    DOB: [birth year], 83 y.o.   MRN: [id number]  HPI Last INR on 01/28/18 was OK at 2.2 Current Coumadin dose is 5mg  daily Denies missed doses. Denies CP, palpitations, falls. Denies unusual bleeding or bruising Consistent with vitamin K intake   Review of Systems  HENT: Negative for nosebleeds.   Cardiovascular: Negative for chest pain and palpitations.  Gastrointestinal: Negative for anal bleeding and blood in stool.  Genitourinary: Negative for hematuria.  Hematological: Does not bruise/bleed easily.       Objective:   Physical Exam  Constitutional: She is oriented to person, place, and time. She appears well-developed and well-nourished.  HENT:  Right Ear: External ear normal.  Left Ear: External ear normal.  Cardiovascular: Normal rate, regular rhythm and normal heart sounds.  Pulmonary/Chest: Effort normal and breath sounds normal.  Neurological: She is alert and oriented to person, place, and time.  Skin: Skin is warm and dry.  Psychiatric: She has a normal mood and affect. Her behavior is normal. Thought content normal.  Vitals reviewed.  BP:  124/78  HR: 78  Wt: 198lb INR 2.0        Assessment & Plan:  1.  INR at goal 2-3 2.  Continue Coumadin 5mg  daily 3.  INR 1 month

## 2018-02-28 DIAGNOSIS — R2681 Unsteadiness on feet: Secondary | ICD-10-CM | POA: Diagnosis not present

## 2018-02-28 DIAGNOSIS — R260 Ataxic gait: Secondary | ICD-10-CM | POA: Diagnosis not present

## 2018-03-04 DIAGNOSIS — R260 Ataxic gait: Secondary | ICD-10-CM | POA: Diagnosis not present

## 2018-03-04 DIAGNOSIS — R2681 Unsteadiness on feet: Secondary | ICD-10-CM | POA: Diagnosis not present

## 2018-03-06 DIAGNOSIS — I739 Peripheral vascular disease, unspecified: Secondary | ICD-10-CM | POA: Diagnosis not present

## 2018-03-06 DIAGNOSIS — L84 Corns and callosities: Secondary | ICD-10-CM | POA: Diagnosis not present

## 2018-03-06 DIAGNOSIS — L603 Nail dystrophy: Secondary | ICD-10-CM | POA: Diagnosis not present

## 2018-03-07 DIAGNOSIS — R260 Ataxic gait: Secondary | ICD-10-CM | POA: Diagnosis not present

## 2018-03-07 DIAGNOSIS — R2681 Unsteadiness on feet: Secondary | ICD-10-CM | POA: Diagnosis not present

## 2018-03-11 DIAGNOSIS — R2681 Unsteadiness on feet: Secondary | ICD-10-CM | POA: Diagnosis not present

## 2018-03-11 DIAGNOSIS — R260 Ataxic gait: Secondary | ICD-10-CM | POA: Diagnosis not present

## 2018-03-14 DIAGNOSIS — R260 Ataxic gait: Secondary | ICD-10-CM | POA: Diagnosis not present

## 2018-03-14 DIAGNOSIS — R2681 Unsteadiness on feet: Secondary | ICD-10-CM | POA: Diagnosis not present

## 2018-03-18 DIAGNOSIS — R2681 Unsteadiness on feet: Secondary | ICD-10-CM | POA: Diagnosis not present

## 2018-03-18 DIAGNOSIS — R260 Ataxic gait: Secondary | ICD-10-CM | POA: Diagnosis not present

## 2018-03-21 DIAGNOSIS — L821 Other seborrheic keratosis: Secondary | ICD-10-CM | POA: Diagnosis not present

## 2018-03-21 DIAGNOSIS — D0439 Carcinoma in situ of skin of other parts of face: Secondary | ICD-10-CM | POA: Diagnosis not present

## 2018-03-21 DIAGNOSIS — D485 Neoplasm of uncertain behavior of skin: Secondary | ICD-10-CM | POA: Diagnosis not present

## 2018-03-21 DIAGNOSIS — L850 Acquired ichthyosis: Secondary | ICD-10-CM | POA: Diagnosis not present

## 2018-03-25 DIAGNOSIS — R260 Ataxic gait: Secondary | ICD-10-CM | POA: Diagnosis not present

## 2018-03-25 DIAGNOSIS — R2681 Unsteadiness on feet: Secondary | ICD-10-CM | POA: Diagnosis not present

## 2018-03-28 DIAGNOSIS — R2681 Unsteadiness on feet: Secondary | ICD-10-CM | POA: Diagnosis not present

## 2018-03-28 DIAGNOSIS — R260 Ataxic gait: Secondary | ICD-10-CM | POA: Diagnosis not present

## 2018-04-01 ENCOUNTER — Encounter: Payer: Self-pay | Admitting: Pharmacotherapy

## 2018-04-01 ENCOUNTER — Ambulatory Visit (INDEPENDENT_AMBULATORY_CARE_PROVIDER_SITE_OTHER): Payer: Medicare Other | Admitting: Pharmacotherapy

## 2018-04-01 VITALS — BP 124/62 | HR 71 | Temp 98.2°F | Resp 18 | Ht 61.0 in | Wt 198.0 lb

## 2018-04-01 DIAGNOSIS — Z7901 Long term (current) use of anticoagulants: Secondary | ICD-10-CM

## 2018-04-01 DIAGNOSIS — R260 Ataxic gait: Secondary | ICD-10-CM | POA: Diagnosis not present

## 2018-04-01 DIAGNOSIS — R2681 Unsteadiness on feet: Secondary | ICD-10-CM | POA: Diagnosis not present

## 2018-04-01 DIAGNOSIS — I480 Paroxysmal atrial fibrillation: Secondary | ICD-10-CM

## 2018-04-01 LAB — POCT INR: INR: 2.5

## 2018-04-01 NOTE — Progress Notes (Signed)
   Subjective:    Patient ID: Andrea [name], female    DOB: 03-Mar-1935, 83 y.o.   MRN: [id number]  HPI Last INR on 02/25/18 was OK at 2.0 Current coumadin dose is 5mg  QD Denies missed doses Denies unusual bleeding or bruising  Denies CP, palpitations, Did fall yesterday - no injury. Consistent with vitamin  K intake.   Review of Systems  HENT: Negative for nosebleeds.   Respiratory: Negative for shortness of breath.   Cardiovascular: Negative for chest pain and palpitations.  Gastrointestinal: Negative for anal bleeding and blood in stool.  Genitourinary: Negative for hematuria.  Hematological: Does not bruise/bleed easily.       Objective:   Physical Exam  Constitutional: She is oriented to person, place, and time. She appears well-developed and well-nourished.  HENT:  Right Ear: External ear normal.  Left Ear: External ear normal.  Cardiovascular: Normal rate and regular rhythm.  Pulmonary/Chest: Effort normal and breath sounds normal.  Neurological: She is alert and oriented to person, place, and time.  Vitals reviewed.  BP 124/62  HR: 71  Wt: 198lb INR 2.5       Assessment & Plan:  1.  INR at goal 2-3 2.  Continue Coumadin 5mg  QD 3.  RTC in 1 month

## 2018-04-01 NOTE — Patient Instructions (Signed)
INR 2.5  Continue Coumadin 5mg  daily  Please be sure has an OV with [name] in 1 month for INR check

## 2018-04-04 ENCOUNTER — Encounter: Payer: Self-pay | Admitting: Nurse Practitioner

## 2018-04-04 ENCOUNTER — Ambulatory Visit (INDEPENDENT_AMBULATORY_CARE_PROVIDER_SITE_OTHER): Payer: Medicare Other | Admitting: Nurse Practitioner

## 2018-04-04 VITALS — BP 122/76 | HR 77 | Temp 98.4°F

## 2018-04-04 DIAGNOSIS — L600 Ingrowing nail: Secondary | ICD-10-CM | POA: Diagnosis not present

## 2018-04-04 DIAGNOSIS — R260 Ataxic gait: Secondary | ICD-10-CM | POA: Diagnosis not present

## 2018-04-04 DIAGNOSIS — R2681 Unsteadiness on feet: Secondary | ICD-10-CM | POA: Diagnosis not present

## 2018-04-04 MED ORDER — CEPHALEXIN 500 MG PO CAPS: 500.0000 mg | ORAL_CAPSULE | Freq: Two times a day (BID) | ORAL | 0 refills | Status: DC

## 2018-04-04 NOTE — Progress Notes (Signed)
Careteam: Patient Care Team: Gayland Curry, DO as PCP - General (Geriatric Medicine) Tanda Rockers, MD as Consulting Physician (Pulmonary Disease)  Advanced Directive information    Allergies  Allergen Reactions  . Clindamycin Hcl Other (See Comments)    REACTION: swelling, pt. Reports that she had a 16 lb. weightgain in one day    Chief Complaint  Patient presents with  . Acute Visit    Ingrown toe nail pain. Declined weight.  . Medication Refill    None needed  . Medication Management    Patient decreased Mybetriq on her own, FYI      HPI: Patient is a 83 y.o. female seen in the office today for painful toenail.  Right big toe has been causing increased pain for about a week. Surrounding toenail red and very tender. Does not feel well today.  Went to podiatrist 2 weeks ago and they trimmed nails.  Unable to get in with them for the end of the month.   Review of Systems:  Review of Systems  Constitutional: Positive for malaise/fatigue. Negative for chills and fever.  Skin:       Redness and pain to left great toenail   Neurological: Positive for weakness.    Past Medical History:  Diagnosis Date  . Anemia   . Anxiety   . Arthritis    hands, spine   . Blood transfusion   . Cervicalgia   . Constipation   . Cough    Wert-onset 08/2009, as of 2014- resolved   . Deep venous thrombosis ([place])    post op, rec'd/needed  blood thinner   . Depression   . Depression with anxiety   . Dysrhythmia   . Falls frequently    pt. reports that she was falling 3 times a day, has had PT at a facility for a while & now is getting home PT 2-3 times/ week   . Headache(784.0) 09/27/2011  . History of stress test    15-20 yrs.ago   . Hypotension   . Hypothyroidism   . Lumbar radiculopathy 09/26/2011  . Pneumonia    never been in hosp. for pneumonia   . Prediabetes   . Right hand fracture    Monday  . Shortness of breath   . Sleep apnea    doesn't use CPAP any longer,  since weight loss   Past Surgical History:  Procedure Laterality Date  . ANTERIOR CERVICAL DECOMP/DISCECTOMY FUSION N/A 07/09/2013   Procedure: ANTERIOR CERVICAL DECOMPRESSION/DISCECTOMY FUSION 3 LEVELS;  Surgeon: Sinclair Ship, MD;  Location: [facility];  Service: Orthopedics;  Laterality: N/A;  Anterior cervical decompression fusion, cervical 3-4, cervical 4-5-, cervical 5-6 with instrumentation, allograft.  . APPENDECTOMY    . BACK SURGERY     2000  . BREAST EXCISIONAL BIOPSY     breast bxs on left x 2, breast bx of right x 1-all benign  . EYE SURGERY     cataracts bilateral /w IOL  . HERNIA REPAIR     umbilical hernia- [id number]  . JOINT REPLACEMENT  2006,2007   bilateral  . LAPAROSCOPIC OVARIAN CYSTECTOMY     not done by laparscopy-abdominal incision  . REPLACEMENT TOTAL KNEE BILATERAL    . TONSILLECTOMY     Social History:   reports that she quit smoking about 49 years ago. Her smoking use included cigarettes. She has a 10.00 pack-year smoking history. She has never used smokeless tobacco. She reports that she drinks about 0.6 oz  of alcohol per week. She reports that she does not use drugs.  Family History  Problem Relation Age of Onset  . Heart disease Father   . Malignant hyperthermia Father   . Arthritis Father   . Deep vein thrombosis Son   . Diabetes Son   . Obesity Son   . Depression Son   . Hypertension Son   . Parkinson's disease Mother   . Diabetes Daughter   . Hypertension Daughter   . Anxiety disorder Daughter   . Hyperlipidemia Son   . Hypertension Son   . Cancer Maternal Aunt     Medications: Patient's Medications  New Prescriptions   No medications on file  Previous Medications   BREXPIPRAZOLE 1 MG TABS    Take 1 tablet by mouth at bedtime.   BUSPIRONE (BUSPAR) 15 MG TABLET    Take 15 mg by mouth daily.   CALCIUM CITRATE PO    Take 600 mg by mouth 2 (two) times daily.   CLONAZEPAM (KLONOPIN) 0.5 MG TABLET    Take 1 tablet by mouth as needed in  middle of the nite to go back to sleep   CRANBERRY EXTRACT PO    Take 2 tablets by mouth daily.   DULOXETINE (CYMBALTA) 60 MG CAPSULE    Take 2 capsules (120 mg total) by mouth daily.   FUROSEMIDE (LASIX) 40 MG TABLET    Take 0.5 tablets (20 mg total) by mouth daily.   LEVOTHYROXINE (SYNTHROID, LEVOTHROID) 50 MCG TABLET    TAKE ONE TABLET BY MOUTH ONCE DAILY 30 MINUTES BEFORE BREAKFAST FOR THYROID   MELOXICAM (MOBIC) 15 MG TABLET    Take one tablet by mouth once daily   MIRABEGRON ER (MYRBETRIQ) 25 MG TB24 TABLET    Take 1 tablet (25 mg total) by mouth daily.   MIRTAZAPINE (REMERON) 15 MG TABLET    Take 7.5 mg by mouth at bedtime.   MULTIPLE VITAMINS-MINERALS (MULTIVITAMIN PO)    Take 1 tablet by mouth.   OXYCODONE-ACETAMINOPHEN (PERCOCET) 10-325 MG TABLET    Take 1 tab every 4 hrs as needed for moderate pain and 2 tabs every 4 hrs as needed for severe pain   TRAZODONE (DESYREL) 100 MG TABLET    Take 2 tablets by mouth at bedtime   WARFARIN (COUMADIN) 5 MG TABLET    Take 5 mg by mouth daily.  Modified Medications   No medications on file  Discontinued Medications   WARFARIN (COUMADIN) 5 MG TABLET    Take one tablet daily except 1/2 tablet onFriday as directed.     Physical Exam:  Vitals:   04/04/18 1348  BP: 122/76  Pulse: 77  Temp: 98.4 F (36.9 C)  SpO2: 98%  Height: 5\' 1"  (1.549 m)   Body mass index is 37.41 kg/m.  Physical Exam  Constitutional: She is oriented to person, place, and time. She appears well-developed and well-nourished.  HENT:  Head: Normocephalic and atraumatic.  Cardiovascular: Normal rate and regular rhythm.  Pulmonary/Chest: Effort normal and breath sounds normal.  Neurological: She is alert and oriented to person, place, and time.  Skin:  Left great toe with increase erythema, tenderness noted to medial aspect of toenail; small pocket of purulent drainage noted as well     Labs reviewed: Basic Metabolic Panel: Recent Labs    10/18/17 0949  NA  138  K 4.2  CL 99  CO2 28  GLUCOSE 108*  BUN 29*  CREATININE 0.93*  CALCIUM 9.3  Liver Function Tests: Recent Labs    10/18/17 0949  AST 15  ALT 8  BILITOT 0.4  PROT 6.6   No results for input(s): LIPASE, AMYLASE in the last 8760 hours. No results for input(s): AMMONIA in the last 8760 hours. CBC: Recent Labs    10/18/17 0949  WBC 6.9  NEUTROABS 4,816  HGB 12.7  HCT 38.3  MCV 85.7  PLT 237   Lipid Panel: No results for input(s): CHOL, HDL, LDLCALC, TRIG, CHOLHDL, LDLDIRECT in the last 8760 hours. TSH: No results for input(s): TSH in the last 8760 hours. A1C: Lab Results  Component Value Date   HGBA1C 5.3 10/18/2017     Assessment/Plan 1. Ingrown toenail of left foot with infection -epsom salt soaks three times daily for 20-30 mins - cephALEXin (KEFLEX) 500 MG capsule; Take 1 capsule (500 mg total) by mouth 2 (two) times daily.  Dispense: 28 capsule; Refill: 0 -avoid tight fitting shoes  Next appt: 04/08/2018 for INR check Staley Lunz K. [place], [facility] [phone]

## 2018-04-04 NOTE — Patient Instructions (Addendum)
To do epsom salt soaks 3 times daily for 20-30 mins x 1 week  Keflex 500 mg by mouth twice daily for 1 week  Loose fitting shoes  To follow up next Monday or Tuesday for INR check

## 2018-04-08 ENCOUNTER — Encounter: Payer: Self-pay | Admitting: Nurse Practitioner

## 2018-04-08 ENCOUNTER — Ambulatory Visit (INDEPENDENT_AMBULATORY_CARE_PROVIDER_SITE_OTHER): Payer: Medicare Other | Admitting: Nurse Practitioner

## 2018-04-08 VITALS — BP 118/70 | HR 87 | Temp 98.7°F | Resp 16 | Ht 61.0 in | Wt 199.0 lb

## 2018-04-08 DIAGNOSIS — Z7901 Long term (current) use of anticoagulants: Secondary | ICD-10-CM | POA: Diagnosis not present

## 2018-04-08 DIAGNOSIS — R260 Ataxic gait: Secondary | ICD-10-CM | POA: Diagnosis not present

## 2018-04-08 DIAGNOSIS — L600 Ingrowing nail: Secondary | ICD-10-CM | POA: Diagnosis not present

## 2018-04-08 DIAGNOSIS — R2681 Unsteadiness on feet: Secondary | ICD-10-CM | POA: Diagnosis not present

## 2018-04-08 DIAGNOSIS — I480 Paroxysmal atrial fibrillation: Secondary | ICD-10-CM

## 2018-04-08 LAB — POCT INR: INR POC, (Mech Heart Valve Range): 2.8 (ref 2.5–3.5)

## 2018-04-08 NOTE — Progress Notes (Signed)
Careteam: Patient Care Team: Gayland Curry, DO as PCP - General (Geriatric Medicine) Tanda Rockers, MD as Consulting Physician (Pulmonary Disease)  Advanced Directive information    Allergies  Allergen Reactions  . Clindamycin Hcl Other (See Comments)    REACTION: swelling, pt. Reports that she had a 16 lb. weightgain in one day    Chief Complaint  Patient presents with  . Acute Visit    INR check and toe check  . Medication Refill    No refills needed at this time     HPI: Patient is a 83 y.o. female seen in the office today for INR check,  INR 2.8 today after being placed on keflex for ingrown toe nail.  Overall toe nail is improving. Still with redness and tenderness but much better.  Continues on keflex without adverse effects  Review of Systems:  Review of Systems  Constitutional: Positive for malaise/fatigue. Negative for chills and fever.  HENT: Negative for nosebleeds.   Cardiovascular: Negative for leg swelling.  Gastrointestinal: Negative for blood in stool.  Genitourinary: Negative for hematuria.  Skin:       Redness and pain improved to left great toenail   Neurological: Positive for weakness.     Past Medical History:  Diagnosis Date  . Anemia   . Anxiety   . Arthritis    hands, spine   . Blood transfusion   . Cervicalgia   . Constipation   . Cough    Wert-onset 08/2009, as of 2014- resolved   . Deep venous thrombosis ([place])    post op, rec'd/needed  blood thinner   . Depression   . Depression with anxiety   . Dysrhythmia   . Falls frequently    pt. reports that she was falling 3 times a day, has had PT at a facility for a while & now is getting home PT 2-3 times/ week   . Headache(784.0) 09/27/2011  . History of stress test    15-20 yrs.ago   . Hypotension   . Hypothyroidism   . Lumbar radiculopathy 09/26/2011  . Pneumonia    never been in hosp. for pneumonia   . Prediabetes   . Right hand fracture    Monday  . Shortness of  breath   . Sleep apnea    doesn't use CPAP any longer, since weight loss   Past Surgical History:  Procedure Laterality Date  . ANTERIOR CERVICAL DECOMP/DISCECTOMY FUSION N/A 07/09/2013   Procedure: ANTERIOR CERVICAL DECOMPRESSION/DISCECTOMY FUSION 3 LEVELS;  Surgeon: Sinclair Ship, MD;  Location: [facility];  Service: Orthopedics;  Laterality: N/A;  Anterior cervical decompression fusion, cervical 3-4, cervical 4-5-, cervical 5-6 with instrumentation, allograft.  . APPENDECTOMY    . BACK SURGERY     2000  . BREAST EXCISIONAL BIOPSY     breast bxs on left x 2, breast bx of right x 1-all benign  . EYE SURGERY     cataracts bilateral /w IOL  . HERNIA REPAIR     umbilical hernia- [id number]  . JOINT REPLACEMENT  2006,2007   bilateral  . LAPAROSCOPIC OVARIAN CYSTECTOMY     not done by laparscopy-abdominal incision  . REPLACEMENT TOTAL KNEE BILATERAL    . TONSILLECTOMY     Social History:   reports that she quit smoking about 49 years ago. Her smoking use included cigarettes. She has a 10.00 pack-year smoking history. She has never used smokeless tobacco. She reports that she drinks about 0.6 oz of  alcohol per week. She reports that she does not use drugs.  Family History  Problem Relation Age of Onset  . Heart disease Father   . Malignant hyperthermia Father   . Arthritis Father   . Deep vein thrombosis Son   . Diabetes Son   . Obesity Son   . Depression Son   . Hypertension Son   . Parkinson's disease Mother   . Diabetes Daughter   . Hypertension Daughter   . Anxiety disorder Daughter   . Hyperlipidemia Son   . Hypertension Son   . Cancer Maternal Aunt     Medications: Patient's Medications  New Prescriptions   No medications on file  Previous Medications   BREXPIPRAZOLE 1 MG TABS    Take 1 tablet by mouth at bedtime.   BUSPIRONE (BUSPAR) 15 MG TABLET    Take 15 mg by mouth daily.   CALCIUM CITRATE PO    Take 600 mg by mouth 2 (two) times daily.   CEPHALEXIN (KEFLEX)  500 MG CAPSULE    Take 1 capsule (500 mg total) by mouth 2 (two) times daily.   CLONAZEPAM (KLONOPIN) 0.5 MG TABLET    Take 1 tablet by mouth as needed in middle of the nite to go back to sleep   CRANBERRY EXTRACT PO    Take 2 tablets by mouth daily.   DULOXETINE (CYMBALTA) 60 MG CAPSULE    Take 2 capsules (120 mg total) by mouth daily.   FUROSEMIDE (LASIX) 40 MG TABLET    Take 0.5 tablets (20 mg total) by mouth daily.   LEVOTHYROXINE (SYNTHROID, LEVOTHROID) 50 MCG TABLET    TAKE ONE TABLET BY MOUTH ONCE DAILY 30 MINUTES BEFORE BREAKFAST FOR THYROID   MELOXICAM (MOBIC) 15 MG TABLET    Take one tablet by mouth once daily   MIRABEGRON ER (MYRBETRIQ) 25 MG TB24 TABLET    Take 1 tablet (25 mg total) by mouth daily.   MIRTAZAPINE (REMERON) 15 MG TABLET    Take 7.5 mg by mouth at bedtime.   MULTIPLE VITAMINS-MINERALS (MULTIVITAMIN PO)    Take 1 tablet by mouth.   OXYCODONE-ACETAMINOPHEN (PERCOCET) 10-325 MG TABLET    Take 1 tab every 4 hrs as needed for moderate pain and 2 tabs every 4 hrs as needed for severe pain   TRAZODONE (DESYREL) 100 MG TABLET    Take 2 tablets by mouth at bedtime   WARFARIN (COUMADIN) 5 MG TABLET    Take 5 mg by mouth daily.  Modified Medications   No medications on file  Discontinued Medications   No medications on file     Physical Exam:  Vitals:   04/08/18 1315  BP: 118/70  Pulse: 87  Resp: 16  Temp: 98.7 F (37.1 C)  TempSrc: Oral  SpO2: 91%  Weight: 199 lb (90.3 kg)  Height: 5\' 1"  (1.549 m)   Body mass index is 37.6 kg/m.  Physical Exam  Constitutional: She is oriented to person, place, and time. She appears well-developed and well-nourished.  HENT:  Head: Normocephalic and atraumatic.  Cardiovascular: Normal rate and regular rhythm.  Pulmonary/Chest: Effort normal and breath sounds normal.  Neurological: She is alert and oriented to person, place, and time.  Skin:  Left great toe with improved erythema, tenderness improved to toenail; small pocket  of purulent drainage noted as well     Labs reviewed: Basic Metabolic Panel: Recent Labs    10/18/17 0949  NA 138  K 4.2  CL 99  CO2 28  GLUCOSE 108*  BUN 29*  CREATININE 0.93*  CALCIUM 9.3   Liver Function Tests: Recent Labs    10/18/17 0949  AST 15  ALT 8  BILITOT 0.4  PROT 6.6   No results for input(s): LIPASE, AMYLASE in the last 8760 hours. No results for input(s): AMMONIA in the last 8760 hours. CBC: Recent Labs    10/18/17 0949  WBC 6.9  NEUTROABS 4,816  HGB 12.7  HCT 38.3  MCV 85.7  PLT 237   Lipid Panel: No results for input(s): CHOL, HDL, LDLCALC, TRIG, CHOLHDL, LDLDIRECT in the last 8760 hours. TSH: No results for input(s): TSH in the last 8760 hours. A1C: Lab Results  Component Value Date   HGBA1C 5.3 10/18/2017     Assessment/Plan 1. Ingrown toenail of left foot with infection -improving, continue antibiotic as prescribed with epsom salt soaks.   2. Long term (current) use of anticoagulants - POC INR 2.8. Cont current dose and keep follow up as scheduled  3. Paroxysmal A-fib ([place]) Rate controlled, continues on coumadin for anticoagulation   Next appt: 05/02/2018 Carlos American. [place], [facility] [phone]

## 2018-04-08 NOTE — Patient Instructions (Signed)
To continue antibiotic and warm soaks  Continue coumadin as prescribed  Follow up as scheduled

## 2018-04-10 DIAGNOSIS — L03032 Cellulitis of left toe: Secondary | ICD-10-CM | POA: Diagnosis not present

## 2018-04-11 DIAGNOSIS — R2681 Unsteadiness on feet: Secondary | ICD-10-CM | POA: Diagnosis not present

## 2018-04-11 DIAGNOSIS — R260 Ataxic gait: Secondary | ICD-10-CM | POA: Diagnosis not present

## 2018-04-17 ENCOUNTER — Other Ambulatory Visit: Payer: Self-pay

## 2018-04-17 ENCOUNTER — Other Ambulatory Visit: Payer: Self-pay | Admitting: Internal Medicine

## 2018-04-17 DIAGNOSIS — E039 Hypothyroidism, unspecified: Secondary | ICD-10-CM

## 2018-04-25 DIAGNOSIS — M79674 Pain in right toe(s): Secondary | ICD-10-CM | POA: Diagnosis not present

## 2018-04-25 DIAGNOSIS — B351 Tinea unguium: Secondary | ICD-10-CM | POA: Diagnosis not present

## 2018-04-25 DIAGNOSIS — M79675 Pain in left toe(s): Secondary | ICD-10-CM | POA: Diagnosis not present

## 2018-04-25 DIAGNOSIS — I739 Peripheral vascular disease, unspecified: Secondary | ICD-10-CM | POA: Diagnosis not present

## 2018-05-02 ENCOUNTER — Encounter: Payer: Self-pay | Admitting: Nurse Practitioner

## 2018-05-02 ENCOUNTER — Ambulatory Visit (INDEPENDENT_AMBULATORY_CARE_PROVIDER_SITE_OTHER): Payer: Medicare Other | Admitting: Nurse Practitioner

## 2018-05-02 VITALS — BP 116/72 | HR 63 | Temp 98.6°F | Ht 61.0 in | Wt 200.0 lb

## 2018-05-02 DIAGNOSIS — I480 Paroxysmal atrial fibrillation: Secondary | ICD-10-CM | POA: Diagnosis not present

## 2018-05-02 DIAGNOSIS — G894 Chronic pain syndrome: Secondary | ICD-10-CM

## 2018-05-02 DIAGNOSIS — Z7901 Long term (current) use of anticoagulants: Secondary | ICD-10-CM

## 2018-05-02 LAB — POCT INR: INR: 2.7 (ref 2.0–3.0)

## 2018-05-02 MED ORDER — OXYCODONE-ACETAMINOPHEN 10-325 MG PO TABS: ORAL_TABLET | ORAL | 0 refills | Status: DC

## 2018-05-02 MED ORDER — FUROSEMIDE 40 MG PO TABS: 20.0000 mg | ORAL_TABLET | Freq: Every day | ORAL | 1 refills | Status: DC

## 2018-05-02 NOTE — Progress Notes (Signed)
Careteam: Patient Care Team: Gayland Curry, DO as PCP - General (Geriatric Medicine) Tanda Rockers, MD as Consulting Physician (Pulmonary Disease)  Advanced Directive information Does Patient Have a Medical Advance Directive?: Yes, Type of Advance Directive: Out of facility DNR (pink MOST or yellow form), Pre-existing out of facility DNR order (yellow form or pink MOST form): Yellow form placed in chart (order not valid for inpatient use)  Allergies  Allergen Reactions  . Clindamycin Hcl Other (See Comments)    REACTION: swelling, pt. Reports that she had a 16 lb. weightgain in one day    Chief Complaint  Patient presents with  . Medical Management of Chronic Issues    Pt is being seen for an INR check. Current INR is 2.7. Previous was 2.8 on 04/08/18.   . Medication Refill    Rx for oxycodone and lasix pended. [place] database verified     HPI: Patient is a 83 y.o. female seen in the office today for INR follow up.  Pt was seen on 5/20 for INR which was 2.8. She was on keflex for ingrown toenail. She ended up having to go to podiatry were they cut out some of the toenail. Much better currently. She was placed on lamisil for fungus to prevent further problems. Pt reports he is following labs and plans to recheck her in a few months. Has been taking for a few weeks.  She has maintained on current dose of coumadin at 5 mg daily.  Denies missed doses, unusual bleeding or bruising, no CP or palpitations.  No changes diet reported.   Review of Systems:  Review of Systems  Constitutional: Negative for chills and fever.  HENT: Negative for nosebleeds.   Respiratory: Negative for cough, hemoptysis and shortness of breath.   Cardiovascular: Positive for palpitations (chronic without changes). Negative for chest pain.  Gastrointestinal: Negative for blood in stool.  Genitourinary: Negative for hematuria.  Skin: Negative for itching and rash.  Neurological: Negative for headaches.     Past Medical History:  Diagnosis Date  . Anemia   . Anxiety   . Arthritis    hands, spine   . Blood transfusion   . Cervicalgia   . Constipation   . Cough    Wert-onset 08/2009, as of 2014- resolved   . Deep venous thrombosis ([place])    post op, rec'd/needed  blood thinner   . Depression   . Depression with anxiety   . Dysrhythmia   . Falls frequently    pt. reports that she was falling 3 times a day, has had PT at a facility for a while & now is getting home PT 2-3 times/ week   . Headache(784.0) 09/27/2011  . History of stress test    15-20 yrs.ago   . Hypotension   . Hypothyroidism   . Lumbar radiculopathy 09/26/2011  . Pneumonia    never been in hosp. for pneumonia   . Prediabetes   . Right hand fracture    Monday  . Shortness of breath   . Sleep apnea    doesn't use CPAP any longer, since weight loss   Past Surgical History:  Procedure Laterality Date  . ANTERIOR CERVICAL DECOMP/DISCECTOMY FUSION N/A 07/09/2013   Procedure: ANTERIOR CERVICAL DECOMPRESSION/DISCECTOMY FUSION 3 LEVELS;  Surgeon: Sinclair Ship, MD;  Location: [facility];  Service: Orthopedics;  Laterality: N/A;  Anterior cervical decompression fusion, cervical 3-4, cervical 4-5-, cervical 5-6 with instrumentation, allograft.  . APPENDECTOMY    .  BACK SURGERY     2000  . BREAST EXCISIONAL BIOPSY     breast bxs on left x 2, breast bx of right x 1-all benign  . EYE SURGERY     cataracts bilateral /w IOL  . HERNIA REPAIR     umbilical hernia- [id number]  . JOINT REPLACEMENT  2006,2007   bilateral  . LAPAROSCOPIC OVARIAN CYSTECTOMY     not done by laparscopy-abdominal incision  . REPLACEMENT TOTAL KNEE BILATERAL    . TONSILLECTOMY     Social History:   reports that she quit smoking about 49 years ago. Her smoking use included cigarettes. She has a 10.00 pack-year smoking history. She has never used smokeless tobacco. She reports that she drinks about 0.6 oz of alcohol per week. She reports that she  does not use drugs.  Family History  Problem Relation Age of Onset  . Heart disease Father   . Malignant hyperthermia Father   . Arthritis Father   . Deep vein thrombosis Son   . Diabetes Son   . Obesity Son   . Depression Son   . Hypertension Son   . Parkinson's disease Mother   . Diabetes Daughter   . Hypertension Daughter   . Anxiety disorder Daughter   . Hyperlipidemia Son   . Hypertension Son   . Cancer Maternal Aunt     Medications: Patient's Medications  New Prescriptions   No medications on file  Previous Medications   BREXPIPRAZOLE 1 MG TABS    Take 1 tablet by mouth at bedtime.   BUSPIRONE (BUSPAR) 15 MG TABLET    Take 15 mg by mouth daily.   CALCIUM CITRATE PO    Take 600 mg by mouth 2 (two) times daily.   CLONAZEPAM (KLONOPIN) 0.5 MG TABLET    Take 1 tablet by mouth as needed in middle of the nite to go back to sleep   CRANBERRY EXTRACT PO    Take 2 tablets by mouth daily.   DULOXETINE (CYMBALTA) 60 MG CAPSULE    Take 2 capsules (120 mg total) by mouth daily.   FUROSEMIDE (LASIX) 40 MG TABLET    Take 0.5 tablets (20 mg total) by mouth daily.   LEVOTHYROXINE (SYNTHROID, LEVOTHROID) 50 MCG TABLET    TAKE ONE TABLET BY MOUTH ONCE DAILY 30 MINUTES BEFORE BREAKFAST FOR THYROID   MELOXICAM (MOBIC) 15 MG TABLET    Take one tablet by mouth once daily   MIRTAZAPINE (REMERON) 15 MG TABLET    Take 7.5 mg by mouth at bedtime.   MULTIPLE VITAMINS-MINERALS (MULTIVITAMIN PO)    Take 1 tablet by mouth.   OXYCODONE-ACETAMINOPHEN (PERCOCET) 10-325 MG TABLET    Take 1 tab every 4 hrs as needed for moderate pain and 2 tabs every 4 hrs as needed for severe pain   TERBINAFINE (LAMISIL) 250 MG TABLET    Take 250 mg by mouth daily.   TRAZODONE (DESYREL) 100 MG TABLET    Take 2 tablets by mouth at bedtime   WARFARIN (COUMADIN) 5 MG TABLET    Take 5 mg by mouth daily.  Modified Medications   No medications on file  Discontinued Medications   CEPHALEXIN (KEFLEX) 500 MG CAPSULE    Take 1  capsule (500 mg total) by mouth 2 (two) times daily.   MIRABEGRON ER (MYRBETRIQ) 25 MG TB24 TABLET    Take 1 tablet (25 mg total) by mouth daily.     Physical Exam:  Vitals:   05/02/18 1001  BP: 116/72  Pulse: 63  Temp: 98.6 F (37 C)  TempSrc: Oral  SpO2: 97%  Weight: 200 lb (90.7 kg)  Height: 5\' 1"  (1.549 m)   Body mass index is 37.79 kg/m.  Physical Exam  Constitutional: She is oriented to person, place, and time. She appears well-developed and well-nourished.  HENT:  Head: Normocephalic and atraumatic.  Right Ear: External ear normal.  Left Ear: External ear normal.  Cardiovascular: Normal rate, regular rhythm and normal heart sounds.  Pulmonary/Chest: Effort normal and breath sounds normal.  Musculoskeletal: She exhibits no edema.  Neurological: She is alert and oriented to person, place, and time.  Skin: Skin is warm and dry.  Psychiatric: She has a normal mood and affect.    Labs reviewed: Basic Metabolic Panel: Recent Labs    10/18/17 0949  NA 138  K 4.2  CL 99  CO2 28  GLUCOSE 108*  BUN 29*  CREATININE 0.93*  CALCIUM 9.3   Liver Function Tests: Recent Labs    10/18/17 0949  AST 15  ALT 8  BILITOT 0.4  PROT 6.6   No results for input(s): LIPASE, AMYLASE in the last 8760 hours. No results for input(s): AMMONIA in the last 8760 hours. CBC: Recent Labs    10/18/17 0949  WBC 6.9  NEUTROABS 4,816  HGB 12.7  HCT 38.3  MCV 85.7  PLT 237   Lipid Panel: No results for input(s): CHOL, HDL, LDLCALC, TRIG, CHOLHDL, LDLDIRECT in the last 8760 hours. TSH: No results for input(s): TSH in the last 8760 hours. A1C: Lab Results  Component Value Date   HGBA1C 5.3 10/18/2017     Assessment/Plan 1. Paroxysmal A-fib ([facility]) -rate controlled, currently in SR, continues on coumadin for anticoagulation  - furosemide (LASIX) 40 MG tablet; Take 0.5 tablets (20 mg total) by mouth daily.  Dispense: 45 tablet; Refill: 1  2. Chronic pain syndrome -refill  provided today - oxyCODONE-acetaminophen (PERCOCET) 10-325 MG tablet; Take 1 tab every 4 hrs as needed for moderate pain and 2 tabs every 4 hrs as needed for severe pain  Dispense: 180 tablet; Refill: 0  3. Long term (current) use of anticoagulants - POC INR today at 2.7, remains stable on coumadin 5 mg. Will continue current dose which she has been on long term.   Next appt: will have her follow up labs with INR in 1 month Dillan Lunden K. [place], [facility] [phone]

## 2018-05-10 ENCOUNTER — Other Ambulatory Visit: Payer: Self-pay | Admitting: Internal Medicine

## 2018-05-20 ENCOUNTER — Telehealth: Payer: Self-pay | Admitting: *Deleted

## 2018-05-20 MED ORDER — MIRABEGRON ER 25 MG PO TB24: 25.0000 mg | ORAL_TABLET | Freq: Every day | ORAL | 5 refills | Status: DC

## 2018-05-20 NOTE — Telephone Encounter (Signed)
The myrbetriq does not cause memory loss.  I know it's expensive, but it should decrease her frequency and urgency of urination.  Her caregiver usually calls and comes to visits.

## 2018-05-20 NOTE — Telephone Encounter (Signed)
Spoke with daughter and pt states she only takes this medication as needed. Per verbal from Dr. Mariea Clonts pt should take this medication everyday. Pt states she only takes it as needed, she's having bladder leakage and pt scared it make cause memory loss. rx sent to pharmacy

## 2018-05-20 NOTE — Telephone Encounter (Signed)
Spoke with patient and advised results   

## 2018-05-20 NOTE — Telephone Encounter (Signed)
Calling for refill on Myrbetriq. Not on current med and shows it was stopped 05/02/18. However, office note does not address this. Please advise/RF. Thanks!

## 2018-05-22 DIAGNOSIS — B351 Tinea unguium: Secondary | ICD-10-CM | POA: Diagnosis not present

## 2018-05-22 DIAGNOSIS — M79674 Pain in right toe(s): Secondary | ICD-10-CM | POA: Diagnosis not present

## 2018-05-22 DIAGNOSIS — M79675 Pain in left toe(s): Secondary | ICD-10-CM | POA: Diagnosis not present

## 2018-05-31 ENCOUNTER — Other Ambulatory Visit: Payer: Self-pay

## 2018-05-31 DIAGNOSIS — I4891 Unspecified atrial fibrillation: Secondary | ICD-10-CM

## 2018-05-31 DIAGNOSIS — R739 Hyperglycemia, unspecified: Secondary | ICD-10-CM

## 2018-05-31 DIAGNOSIS — E039 Hypothyroidism, unspecified: Secondary | ICD-10-CM

## 2018-05-31 DIAGNOSIS — D6869 Other thrombophilia: Secondary | ICD-10-CM

## 2018-05-31 DIAGNOSIS — I480 Paroxysmal atrial fibrillation: Secondary | ICD-10-CM

## 2018-06-03 ENCOUNTER — Other Ambulatory Visit: Payer: Medicare Other

## 2018-06-03 DIAGNOSIS — R739 Hyperglycemia, unspecified: Secondary | ICD-10-CM | POA: Diagnosis not present

## 2018-06-03 DIAGNOSIS — D6869 Other thrombophilia: Secondary | ICD-10-CM | POA: Diagnosis not present

## 2018-06-03 DIAGNOSIS — E039 Hypothyroidism, unspecified: Secondary | ICD-10-CM | POA: Diagnosis not present

## 2018-06-03 DIAGNOSIS — I480 Paroxysmal atrial fibrillation: Secondary | ICD-10-CM | POA: Diagnosis not present

## 2018-06-04 LAB — CBC WITH DIFFERENTIAL/PLATELET
Basophils Absolute: 28 cells/uL (ref 0–200)
Basophils Relative: 0.6 %
Eosinophils Absolute: 132 cells/uL (ref 15–500)
Eosinophils Relative: 2.8 %
HCT: 39.9 % (ref 35.0–45.0)
Hemoglobin: 13.5 g/dL (ref 11.7–15.5)
Lymphs Abs: 1264 cells/uL (ref 850–3900)
MCH: 30.3 pg (ref 27.0–33.0)
MCHC: 33.8 g/dL (ref 32.0–36.0)
MCV: 89.7 fL (ref 80.0–100.0)
MPV: 10 fL (ref 7.5–12.5)
Monocytes Relative: 6.6 %
Neutro Abs: 2966 cells/uL (ref 1500–7800)
Neutrophils Relative %: 63.1 %
Platelets: 219 10*3/uL (ref 140–400)
RBC: 4.45 10*6/uL (ref 3.80–5.10)
RDW: 12.5 % (ref 11.0–15.0)
Total Lymphocyte: 26.9 %
WBC mixed population: 310 cells/uL (ref 200–950)
WBC: 4.7 10*3/uL (ref 3.8–10.8)

## 2018-06-04 LAB — COMPLETE METABOLIC PANEL WITH GFR
AG Ratio: 1.8 (calc) (ref 1.0–2.5)
ALT: 7 U/L (ref 6–29)
AST: 15 U/L (ref 10–35)
Albumin: 4.2 g/dL (ref 3.6–5.1)
Alkaline phosphatase (APISO): 55 U/L (ref 33–130)
BUN/Creatinine Ratio: 28 (calc) — ABNORMAL HIGH (ref 6–22)
BUN: 30 mg/dL — ABNORMAL HIGH (ref 7–25)
CO2: 31 mmol/L (ref 20–32)
Calcium: 9.4 mg/dL (ref 8.6–10.4)
Chloride: 99 mmol/L (ref 98–110)
Creat: 1.08 mg/dL — ABNORMAL HIGH (ref 0.60–0.88)
GFR, Est African American: 56 mL/min/{1.73_m2} — ABNORMAL LOW (ref >=60)
GFR, Est Non African American: 48 mL/min/{1.73_m2} — ABNORMAL LOW (ref >=60)
Globulin: 2.3 g/dL (calc) (ref 1.9–3.7)
Glucose, Bld: 100 mg/dL — ABNORMAL HIGH (ref 65–99)
Potassium: 3.9 mmol/L (ref 3.5–5.3)
Sodium: 137 mmol/L (ref 135–146)
Total Bilirubin: 0.5 mg/dL (ref 0.2–1.2)
Total Protein: 6.5 g/dL (ref 6.1–8.1)

## 2018-06-04 LAB — HEMOGLOBIN A1C
Hgb A1c MFr Bld: 5.2 % of total Hgb (ref <5.7)
Mean Plasma Glucose: 103 (calc)
eAG (mmol/L): 5.7 (calc)

## 2018-06-04 LAB — PROTIME-INR
INR: 1.3 — ABNORMAL HIGH
Prothrombin Time: 13.9 s — ABNORMAL HIGH (ref 9.0–11.5)

## 2018-06-04 LAB — TSH: TSH: 1.23 mIU/L (ref 0.40–4.50)

## 2018-06-06 ENCOUNTER — Other Ambulatory Visit: Payer: Self-pay | Admitting: *Deleted

## 2018-06-06 MED ORDER — WARFARIN SODIUM 1 MG PO TABS: 1.0000 mg | ORAL_TABLET | Freq: Every day | ORAL | 1 refills | Status: DC

## 2018-06-17 ENCOUNTER — Other Ambulatory Visit: Payer: Medicare Other

## 2018-06-19 ENCOUNTER — Encounter: Payer: Self-pay | Admitting: Internal Medicine

## 2018-06-19 ENCOUNTER — Ambulatory Visit (INDEPENDENT_AMBULATORY_CARE_PROVIDER_SITE_OTHER): Payer: Medicare Other | Admitting: Internal Medicine

## 2018-06-19 VITALS — BP 138/82 | HR 69 | Temp 98.3°F | Resp 18 | Ht 61.0 in | Wt 195.4 lb

## 2018-06-19 DIAGNOSIS — I480 Paroxysmal atrial fibrillation: Secondary | ICD-10-CM | POA: Diagnosis not present

## 2018-06-19 DIAGNOSIS — M79674 Pain in right toe(s): Secondary | ICD-10-CM | POA: Diagnosis not present

## 2018-06-19 DIAGNOSIS — M79675 Pain in left toe(s): Secondary | ICD-10-CM | POA: Diagnosis not present

## 2018-06-19 DIAGNOSIS — I4891 Unspecified atrial fibrillation: Principal | ICD-10-CM

## 2018-06-19 DIAGNOSIS — D689 Coagulation defect, unspecified: Secondary | ICD-10-CM

## 2018-06-19 DIAGNOSIS — D6869 Other thrombophilia: Secondary | ICD-10-CM

## 2018-06-19 DIAGNOSIS — B351 Tinea unguium: Secondary | ICD-10-CM | POA: Diagnosis not present

## 2018-06-19 LAB — POCT INR: INR: 1.6 — AB (ref 2.0–3.0)

## 2018-06-19 NOTE — Progress Notes (Signed)
Patient ID: Andrea [name], female   DOB: 1935-11-17, 83 y.o.   MRN: [id number]   Location:  [facility] OFFICE  Provider: DR Arletha Grippe  Code Status:  Goals of Care:  Advanced Directives 05/02/2018  Does Patient Have a Medical Advance Directive? Yes  Type of Advance Directive Out of facility DNR (pink MOST or yellow form)  Does patient want to make changes to medical advance directive? -  Copy of [place] in Chart? -  Would patient like information on creating a medical advance directive? -  Pre-existing out of facility DNR order (yellow form or pink MOST form) Yellow form placed in chart (order not valid for inpatient use)     Chief Complaint  Patient presents with  . Coagulation Disorder    Patient in for PT/INR check, Coumadin dose 5mg , 6 mg every other day,    HPI: Patient is a 83 y.o. female seen today for medical management of chronic diseases.  She is on coumadin for afib. GOAL INR 2 - 3. INR TODAY 1.6 (prev 1.3). Daughter reports she may have missed at least 2 doses in the last week. She did drink some celery juice about 1 week ago. No bleeding or easy bruising  Family would like her to start lecithin vitamin for brain and liver.    Past Medical History:  Diagnosis Date  . Anemia   . Anxiety   . Arthritis    hands, spine   . Blood transfusion   . Cervicalgia   . Constipation   . Cough    Wert-onset 08/2009, as of 2014- resolved   . Deep venous thrombosis ([place])    post op, rec'd/needed  blood thinner   . Depression   . Depression with anxiety   . Dysrhythmia   . Falls frequently    pt. reports that she was falling 3 times a day, has had PT at a facility for a while & now is getting home PT 2-3 times/ week   . Headache(784.0) 09/27/2011  . History of stress test    15-20 yrs.ago   . Hypotension   . Hypothyroidism   . Lumbar radiculopathy 09/26/2011  . Pneumonia    never been in hosp. for pneumonia   . Prediabetes   . Right hand fracture    Monday  . Shortness of breath   . Sleep apnea    doesn't use CPAP any longer, since weight loss    Past Surgical History:  Procedure Laterality Date  . ANTERIOR CERVICAL DECOMP/DISCECTOMY FUSION N/A 07/09/2013   Procedure: ANTERIOR CERVICAL DECOMPRESSION/DISCECTOMY FUSION 3 LEVELS;  Surgeon: Sinclair Ship, MD;  Location: [place];  Service: Orthopedics;  Laterality: N/A;  Anterior cervical decompression fusion, cervical 3-4, cervical 4-5-, cervical 5-6 with instrumentation, allograft.  . APPENDECTOMY    . BACK SURGERY     2000  . BREAST EXCISIONAL BIOPSY     breast bxs on left x 2, breast bx of right x 1-all benign  . EYE SURGERY     cataracts bilateral /w IOL  . HERNIA REPAIR     umbilical hernia- 7510  . JOINT REPLACEMENT  2006,2007   bilateral  . LAPAROSCOPIC OVARIAN CYSTECTOMY     not done by laparscopy-abdominal incision  . REPLACEMENT TOTAL KNEE BILATERAL    . TONSILLECTOMY       reports that she quit smoking about 49 years ago. Her smoking use included cigarettes. She has a 10.00 pack-year smoking history. She has never  used smokeless tobacco. She reports that she drinks about 0.6 oz of alcohol per week. She reports that she does not use drugs. Social History   Socioeconomic History  . Marital status: Single    Spouse name: Not on file  . Number of children: 3  . Years of education: College  . Highest education level: Not on file  Occupational History  . Occupation: Retired Animal nutritionist  . Financial resource strain: Not very hard  . Food insecurity:    Worry: Never true    Inability: Never true  . Transportation needs:    Medical: No    Non-medical: No  Tobacco Use  . Smoking status: Former Smoker    Packs/day: 0.50    Years: 20.00    Pack years: 10.00    Types: Cigarettes    Last attempt to quit: 11/20/1968    Years since quitting: 49.6  . Smokeless tobacco: Never Used  . Tobacco comment: smoked for approx. 10 years  Substance and Sexual Activity    . Alcohol use: Yes    Alcohol/week: 0.6 oz    Types: 1 Glasses of wine per week    Comment: rare  . Drug use: No  . Sexual activity: Never  Lifestyle  . Physical activity:    Days per week: 0 days    Minutes per session: 0 min  . Stress: Only a little  Relationships  . Social connections:    Talks on phone: More than three times a week    Gets together: More than three times a week    Attends religious service: More than 4 times per year    Active member of club or organization: Yes    Attends meetings of clubs or organizations: Never    Relationship status: Divorced  . Intimate partner violence:    Fear of current or ex partner: No    Emotionally abused: No    Physically abused: No    Forced sexual activity: No  Other Topics Concern  . Not on file  Social History Narrative   Patient is single and lives alone.   Patient is retired.   Patient has a college education.   Patient has three adult children   Patient is right-handed.   Patient drinks 3-4 cups of caffeine (soda and coffee) daily.   Walks with walker    Family History  Problem Relation Age of Onset  . Heart disease Father   . Malignant hyperthermia Father   . Arthritis Father   . Deep vein thrombosis Son   . Diabetes Son   . Obesity Son   . Depression Son   . Hypertension Son   . Parkinson's disease Mother   . Diabetes Daughter   . Hypertension Daughter   . Anxiety disorder Daughter   . Hyperlipidemia Son   . Hypertension Son   . Cancer Maternal Aunt     Allergies  Allergen Reactions  . Clindamycin Hcl Other (See Comments)    REACTION: swelling, pt. Reports that she had a 16 lb. weightgain in one day    Outpatient Encounter Medications as of 06/19/2018  Medication Sig  . Brexpiprazole 1 MG TABS Take 1 tablet by mouth at bedtime.  . busPIRone (BUSPAR) 15 MG tablet Take 15 mg by mouth daily.  [name] CALCIUM CITRATE PO Take 600 mg by mouth 2 (two) times daily.  . clonazePAM (KLONOPIN) 0.5 MG tablet  Take 1 tablet by mouth as needed in middle of the  nite to go back to sleep  . CRANBERRY EXTRACT PO Take 2 tablets by mouth daily.  . DULoxetine (CYMBALTA) 60 MG capsule Take 2 capsules (120 mg total) by mouth daily.  . furosemide (LASIX) 40 MG tablet Take 0.5 tablets (20 mg total) by mouth daily.  [name] levothyroxine (SYNTHROID, LEVOTHROID) 50 MCG tablet TAKE ONE TABLET BY MOUTH ONCE DAILY 30 MINUTES BEFORE BREAKFAST FOR THYROID  . meloxicam (MOBIC) 15 MG tablet TAKE 1 TABLET BY MOUTH EVERY DAY  . mirabegron ER (MYRBETRIQ) 25 MG TB24 tablet Take 1 tablet (25 mg total) by mouth daily.  . mirtazapine (REMERON) 15 MG tablet Take 7.5 mg by mouth at bedtime.  . Multiple Vitamins-Minerals (MULTIVITAMIN PO) Take 1 tablet by mouth.  . oxyCODONE-acetaminophen (PERCOCET) 10-325 MG tablet Take 1 tab every 4 hrs as needed for moderate pain and 2 tabs every 4 hrs as needed for severe pain  . terbinafine (LAMISIL) 250 MG tablet Take 250 mg by mouth daily.  . traZODone (DESYREL) 100 MG tablet Take 2 tablets by mouth at bedtime  . warfarin (COUMADIN) 1 MG tablet Take 1 tablet (1 mg total) by mouth daily.  [name] warfarin (COUMADIN) 5 MG tablet Take 5 mg by mouth daily.   No facility-administered encounter medications on file as of 06/19/2018.     Review of Systems:  Review of Systems  HENT: Negative for nosebleeds.   Gastrointestinal: Negative for anal bleeding and blood in stool.  Hematological: Does not bruise/bleed easily.  All other systems reviewed and are negative.   Health Maintenance  Topic Date Due  . OPHTHALMOLOGY EXAM  06/16/1945  . URINE MICROALBUMIN  11/27/2017  . INFLUENZA VACCINE  06/20/2018  . FOOT EXAM  10/18/2018  . HEMOGLOBIN A1C  12/04/2018  . TETANUS/TDAP  11/27/2026  . DEXA SCAN  Completed  . PNA vac Low Risk Adult  Completed    Physical Exam: Vitals:   06/19/18 1206  BP: 138/82  Pulse: 69  Resp: 18  Temp: 98.3 F (36.8 C)  TempSrc: Oral  SpO2: 94%  Weight: 195 lb 6.4 oz  (88.6 kg)  Height: 5\' 1"  (1.549 m)   Body mass index is 36.92 kg/m. Physical Exam  Constitutional: She appears well-developed and well-nourished.  Skin: Skin is warm and dry. No rash noted.  Psychiatric: She has a normal mood and affect. Her behavior is normal. Judgment and thought content normal.    Labs reviewed: Basic Metabolic Panel: Recent Labs    10/18/17 0949 06/03/18 0955  NA 138 137  K 4.2 3.9  CL 99 99  CO2 28 31  GLUCOSE 108* 100*  BUN 29* 30*  CREATININE 0.93* 1.08*  CALCIUM 9.3 9.4  TSH  --  1.23   Liver Function Tests: Recent Labs    10/18/17 0949 06/03/18 0955  AST 15 15  ALT 8 7  BILITOT 0.4 0.5  PROT 6.6 6.5   No results for input(s): LIPASE, AMYLASE in the last 8760 hours. No results for input(s): AMMONIA in the last 8760 hours. CBC: Recent Labs    10/18/17 0949 06/03/18 0955  WBC 6.9 4.7  NEUTROABS 4,816 2,966  HGB 12.7 13.5  HCT 38.3 39.9  MCV 85.7 89.7  PLT 237 219   Lipid Panel: No results for input(s): CHOL, HDL, LDLCALC, TRIG, CHOLHDL, LDLDIRECT in the last 8760 hours. Lab Results  Component Value Date   HGBA1C 5.2 06/03/2018    Procedures since last visit: No results found.  Assessment/Plan   ICD-10-CM  1. Hypercoagulable state due to atrial fibrillation ([place]) D68.69   2. Coagulation disorder ([place]) D68.9 POC INR  3. Paroxysmal A-fib ([facility]) I48.0    INR 1.6 TODAY (GOAL INR 2 - 3 for afib)  CHANGE COUMADIN TO 6 MG EVERY EVENING X 3 DOSES (July 31, AUG 1 AND AUG 2) THEN TAKE COUMADIN 5MG  EVERY EVENING EXCEPT ON MONDAYS, WEDNESDAYS AND FRIDAYS TAKE 6 MG  Ok to use herbal brain/liver supplement  Follow up in 1 week for INR check  Reeve Turnley S. Perlie Gold  [facility] [address] [place], [place] [address] [phone] Cell (Monday-Friday 8 AM - 5 PM) [phone] After 5 PM and follow prompts

## 2018-06-19 NOTE — Patient Instructions (Signed)
INR 1.6 TODAY (GOAL INR 2 - 3)  CHANGE COUMADIN TO 6 MG EVERY EVENING X 3 DOSES (July 31, AUG 1 AND AUG 2) THEN TAKE COUMADIN 5MG  EVERY EVENING EXCEPT ON MONDAYS, WEDNESDAYS AND FRIDAYS TAKE 6 MG  Follow up in 1 week for INR check

## 2018-06-20 ENCOUNTER — Encounter: Payer: Self-pay | Admitting: Internal Medicine

## 2018-06-20 ENCOUNTER — Ambulatory Visit (INDEPENDENT_AMBULATORY_CARE_PROVIDER_SITE_OTHER): Payer: Medicare Other | Admitting: Internal Medicine

## 2018-06-20 ENCOUNTER — Ambulatory Visit: Payer: Medicare Other | Admitting: Internal Medicine

## 2018-06-20 VITALS — BP 110/70 | HR 71 | Temp 98.1°F | Ht 61.0 in | Wt 195.0 lb

## 2018-06-20 DIAGNOSIS — R0609 Other forms of dyspnea: Secondary | ICD-10-CM

## 2018-06-20 DIAGNOSIS — R739 Hyperglycemia, unspecified: Secondary | ICD-10-CM | POA: Diagnosis not present

## 2018-06-20 DIAGNOSIS — M5416 Radiculopathy, lumbar region: Secondary | ICD-10-CM

## 2018-06-20 DIAGNOSIS — E039 Hypothyroidism, unspecified: Secondary | ICD-10-CM

## 2018-06-20 DIAGNOSIS — F309 Manic episode, unspecified: Secondary | ICD-10-CM | POA: Diagnosis not present

## 2018-06-20 DIAGNOSIS — I480 Paroxysmal atrial fibrillation: Secondary | ICD-10-CM | POA: Diagnosis not present

## 2018-06-20 DIAGNOSIS — E7800 Pure hypercholesterolemia, unspecified: Secondary | ICD-10-CM | POA: Diagnosis not present

## 2018-06-20 NOTE — Progress Notes (Signed)
Location:  [facility] clinic Provider:  Mika Griffitts L. Mariea Clonts, D.O., C.M.D.  Code Status: DNR Goals of Care:  Advanced Directives 06/20/2018  Does Patient Have a Medical Advance Directive? Yes  Type of Advance Directive Out of facility DNR (pink MOST or yellow form)  Does patient want to make changes to medical advance directive? No - Patient declined  Copy of [place] in Chart? -  Would patient like information on creating a medical advance directive? -  Pre-existing out of facility DNR order (yellow form or pink MOST form) Yellow form placed in chart (order not valid for inpatient use)   Chief Complaint  Patient presents with  . Medical Management of Chronic Issues    2mth follow-up    HPI: Patient is a 83 y.o. female seen today for medical management of chronic diseases.    She is concerned that the lamisil for her toenails is causing shortness of breath.  When she gets up from sitting a while, she gets short of breath, hyperventilates, gets dizzy, then has to sit and feels better.  Denies any other dyspnea on exertion.  No increase in edema.  Weight actually down a little bit.  Drinking tons of water.  POX here is 95%.    Blood was too thick yesterday--1.6.    She's been getting the regular labs for her lamisil.  She's not taken any lamisil the past two days.  She's had no more of these episodes.  Does not seem like a typical side effect, but I'm concerned it's also affecting her coumadin.  Reviewed her labs:  All are stable.    Past Medical History:  Diagnosis Date  . Anemia   . Anxiety   . Arthritis    hands, spine   . Blood transfusion   . Cervicalgia   . Constipation   . Cough    Wert-onset 08/2009, as of 2014- resolved   . Deep venous thrombosis ([place])    post op, rec'd/needed  blood thinner   . Depression   . Depression with anxiety   . Dysrhythmia   . Falls frequently    pt. reports that she was falling 3 times a day, has had PT at a facility for a  while & now is getting home PT 2-3 times/ week   . Headache(784.0) 09/27/2011  . History of stress test    15-20 yrs.ago   . Hypotension   . Hypothyroidism   . Lumbar radiculopathy 09/26/2011  . Pneumonia    never been in hosp. for pneumonia   . Prediabetes   . Right hand fracture    Monday  . Shortness of breath   . Sleep apnea    doesn't use CPAP any longer, since weight loss    Past Surgical History:  Procedure Laterality Date  . ANTERIOR CERVICAL DECOMP/DISCECTOMY FUSION N/A 07/09/2013   Procedure: ANTERIOR CERVICAL DECOMPRESSION/DISCECTOMY FUSION 3 LEVELS;  Surgeon: Sinclair Ship, MD;  Location: [place];  Service: Orthopedics;  Laterality: N/A;  Anterior cervical decompression fusion, cervical 3-4, cervical 4-5-, cervical 5-6 with instrumentation, allograft.  . APPENDECTOMY    . BACK SURGERY     2000  . BREAST EXCISIONAL BIOPSY     breast bxs on left x 2, breast bx of right x 1-all benign  . EYE SURGERY     cataracts bilateral /w IOL  . HERNIA REPAIR     umbilical hernia- 2951  . JOINT REPLACEMENT  2006,2007   bilateral  .  LAPAROSCOPIC OVARIAN CYSTECTOMY     not done by laparscopy-abdominal incision  . REPLACEMENT TOTAL KNEE BILATERAL    . TONSILLECTOMY      Allergies  Allergen Reactions  . Clindamycin Hcl Other (See Comments)    REACTION: swelling, pt. Reports that she had a 16 lb. weightgain in one day    Outpatient Encounter Medications as of 06/20/2018  Medication Sig  . Brexpiprazole 1 MG TABS Take 1 tablet by mouth at bedtime.  . busPIRone (BUSPAR) 15 MG tablet Take 15 mg by mouth daily.  [name] CALCIUM CITRATE PO Take 600 mg by mouth 2 (two) times daily.  . clonazePAM (KLONOPIN) 0.5 MG tablet Take 1 tablet by mouth as needed in middle of the nite to go back to sleep  . CRANBERRY EXTRACT PO Take 2 tablets by mouth daily.  . DULoxetine (CYMBALTA) 60 MG capsule Take 2 capsules (120 mg total) by mouth daily.  . furosemide (LASIX) 40 MG tablet Take 0.5 tablets  (20 mg total) by mouth daily.  [name] levothyroxine (SYNTHROID, LEVOTHROID) 50 MCG tablet TAKE ONE TABLET BY MOUTH ONCE DAILY 30 MINUTES BEFORE BREAKFAST FOR THYROID  . meloxicam (MOBIC) 15 MG tablet TAKE 1 TABLET BY MOUTH EVERY DAY  . mirabegron ER (MYRBETRIQ) 25 MG TB24 tablet Take 1 tablet (25 mg total) by mouth daily.  . mirtazapine (REMERON) 15 MG tablet Take 7.5 mg by mouth at bedtime.  . Multiple Vitamins-Minerals (MULTIVITAMIN PO) Take 1 tablet by mouth.  . oxyCODONE-acetaminophen (PERCOCET) 10-325 MG tablet Take 1 tab every 4 hrs as needed for moderate pain and 2 tabs every 4 hrs as needed for severe pain  . terbinafine (LAMISIL) 250 MG tablet Take 250 mg by mouth daily.  . traZODone (DESYREL) 100 MG tablet Take 2 tablets by mouth at bedtime  . warfarin (COUMADIN) 1 MG tablet Take 1 tablet (1 mg total) by mouth daily.  [name] warfarin (COUMADIN) 5 MG tablet Take 5 mg by mouth daily.   No facility-administered encounter medications on file as of 06/20/2018.     Review of Systems:  Review of Systems  Constitutional: Negative for chills, fever and malaise/fatigue.  HENT: Negative for congestion.   Eyes: Negative for blurred vision.       Glasses  Respiratory: Positive for shortness of breath. Negative for cough and wheezing.   Cardiovascular: Positive for leg swelling. Negative for chest pain and palpitations.  Gastrointestinal: Negative for abdominal pain, blood in stool, constipation and melena.  Genitourinary: Positive for frequency and urgency. Negative for dysuria.  Musculoskeletal: Negative for falls and joint pain.  Skin: Negative for itching and rash.  Neurological: Negative for dizziness and loss of consciousness.  Endo/Heme/Allergies: Bruises/bleeds easily.  Psychiatric/Behavioral: Positive for memory loss. Negative for depression. The patient is not nervous/anxious and does not have insomnia.     Health Maintenance  Topic Date Due  . OPHTHALMOLOGY EXAM  06/16/1945  . URINE  MICROALBUMIN  11/27/2017  . INFLUENZA VACCINE  06/20/2018  . FOOT EXAM  10/18/2018  . HEMOGLOBIN A1C  12/04/2018  . TETANUS/TDAP  11/27/2026  . DEXA SCAN  Completed  . PNA vac Low Risk Adult  Completed    Physical Exam: Vitals:   06/20/18 1006  BP: 110/70  Pulse: 71  Temp: 98.1 F (36.7 C)  TempSrc: Oral  SpO2: 95%  Weight: 195 lb (88.5 kg)  Height: 5\' 1"  (1.549 m)   Body mass index is 36.84 kg/m. Physical Exam  Constitutional: She is oriented to person,  place, and time. She appears well-developed and well-nourished. No distress.  HENT:  Head: Normocephalic and atraumatic.  Cardiovascular: Normal rate, regular rhythm, normal heart sounds and intact distal pulses.  Pulmonary/Chest: Effort normal and breath sounds normal. No respiratory distress.  Abdominal: Bowel sounds are normal. She exhibits no distension. There is no tenderness.  Musculoskeletal: Normal range of motion. She exhibits no tenderness.  Neurological: She is alert and oriented to person, place, and time.  Skin: Skin is warm and dry. Capillary refill takes less than 2 seconds.  Psychiatric: She has a normal mood and affect.    Labs reviewed: Basic Metabolic Panel: Recent Labs    10/18/17 0949 06/03/18 0955  NA 138 137  K 4.2 3.9  CL 99 99  CO2 28 31  GLUCOSE 108* 100*  BUN 29* 30*  CREATININE 0.93* 1.08*  CALCIUM 9.3 9.4  TSH  --  1.23   Liver Function Tests: Recent Labs    10/18/17 0949 06/03/18 0955  AST 15 15  ALT 8 7  BILITOT 0.4 0.5  PROT 6.6 6.5   No results for input(s): LIPASE, AMYLASE in the last 8760 hours. No results for input(s): AMMONIA in the last 8760 hours. CBC: Recent Labs    10/18/17 0949 06/03/18 0955  WBC 6.9 4.7  NEUTROABS 4,816 2,966  HGB 12.7 13.5  HCT 38.3 39.9  MCV 85.7 89.7  PLT 237 219   Lipid Panel: No results for input(s): CHOL, HDL, LDLCALC, TRIG, CHOLHDL, LDLDIRECT in the last 8760 hours. Lab Results  Component Value Date   HGBA1C 5.2  06/03/2018    Assessment/Plan 1. Other form of dyspnea -only when she was standing up then resolves -unclear etiology, but none in two days since stopping lamisil -she will call me back if symptoms recur  2. Paroxysmal A-fib (HCC) - cont current regimen, is in afib at present, rate controlled - CBC with Differential/Platelet; Future  3. Adult hypothyroidism -cont current levothyroxine, tsh at goal - TSH; Future  4. Hyperglycemia - has been in normal to prediabetic range - cont improved diet - Hemoglobin A1c; Future - COMPLETE METABOLIC PANEL WITH GFR; Future - Microalbumin / creatinine urine ratio; Future  5. Lumbar radiculopathy -better recently, remains on mobic and uses percocet when severe  6. Bipolar I disorder, single manic episode ([place]) -continues on her trazodone for sleep, remeron, cymbalta, klonopin, buspar and brexpiprazole  7. Pure hypercholesterolemia -LDL was 108 last year, none done this year so recheck - Lipid panel; Future  Labs/tests ordered:   Orders Placed This Encounter  Procedures  . CBC with Differential/Platelet    Standing Status:   Future    Standing Expiration Date:   02/19/2019  . Lipid panel    Standing Status:   Future    Standing Expiration Date:   02/19/2019  . Hemoglobin A1c    Standing Status:   Future    Standing Expiration Date:   02/19/2019  . TSH    Standing Status:   Future    Standing Expiration Date:   02/19/2019  . COMPLETE METABOLIC PANEL WITH GFR    Standing Status:   Future    Standing Expiration Date:   02/19/2019  . Microalbumin / creatinine urine ratio    Standing Status:   Future    Standing Expiration Date:   02/19/2019    Next appt:  06/27/2018 INR check, 4 mos with me for med mgt, fasting labs before  Janielle Mittelstadt L. Brett Soza, D.O. [place]  [place], [place] [address] Cell Phone (Mon-Fri 8am-5pm):  [phone] On Call:  [phone] & follow prompts after 5pm &  weekends Office Phone:  [phone] Office Fax:  [phone]

## 2018-06-24 ENCOUNTER — Telehealth: Payer: Self-pay | Admitting: *Deleted

## 2018-06-24 NOTE — Telephone Encounter (Signed)
Patient Caregiver, [name] called and stated that patient is having Palpitations and chest pains. Started late last night and has gradually gotten worse. Caregiver stated that patient is not a complainer and is concerned. Patient does not have a Cardiologist. No available appointment for today. Caregiver is going to take patient to [facility] to be evaluated.

## 2018-06-24 NOTE — Telephone Encounter (Signed)
Ok.  So apparently, her symptoms were not from lamisil.  I agree she needs to be seen asap.

## 2018-06-27 ENCOUNTER — Ambulatory Visit (INDEPENDENT_AMBULATORY_CARE_PROVIDER_SITE_OTHER): Payer: Medicare Other | Admitting: Family

## 2018-06-27 ENCOUNTER — Encounter: Payer: Self-pay | Admitting: Family

## 2018-06-27 VITALS — BP 102/60 | HR 67 | Temp 99.0°F | Resp 16 | Ht 61.0 in | Wt 196.3 lb

## 2018-06-27 DIAGNOSIS — Z7901 Long term (current) use of anticoagulants: Secondary | ICD-10-CM | POA: Diagnosis not present

## 2018-06-27 DIAGNOSIS — I480 Paroxysmal atrial fibrillation: Secondary | ICD-10-CM

## 2018-06-27 LAB — POCT INR: INR: 1.3 — AB (ref 2.0–3.0)

## 2018-06-27 NOTE — Patient Instructions (Signed)
Take coumadin 5.5 mg tablet by mouth daily on tueday,thursday,saturday and Sunday Take coumadin 6 mg tablet one by mouth on Monday,wednesday and Friday.   Bleeding Precautions When on Anticoagulant Therapy WHAT IS ANTICOAGULANT THERAPY? Anticoagulant therapy is taking medicine to prevent or reduce blood clots. It is also called blood thinner therapy. Blood clots that form in your blood vessels can be dangerous. They can break loose and travel to your heart, lungs, or brain. This increases your risk of a heart attack or stroke. Anticoagulant therapy causes blood to clot more slowly. You may need anticoagulant therapy if you have:  A medical condition that increases the likelihood that blood clots will form.  A heart defect or a problem with heart rhythm. It is also a common treatment after heart surgery, such as valve replacement. WHAT ARE COMMON TYPES OF ANTICOAGULANT THERAPY? Anticoagulant medicine can be injected or taken by mouth.If you need anticoagulant therapy quickly at the hospital, the medicine may be injected under your skin or given through an IV tube. Heparin is a common example of an anticoagulant that you may get at the hospital. Most anticoagulant therapy is in the form of pills that you take at home every day. These may include:  Aspirin. This common blood thinner works by preventing blood cells (platelets) from sticking together to form a clot. Aspirin is not as strong as anticoagulants that slow down the time that it takes for your body to form a clot.  Clopidogrel. This is a newer type of drug that affects platelets. It is stronger than aspirin.  Warfarin. This is the most common anticoagulant. It changes the way your body uses vitamin K, a vitamin that helps your blood to clot. The risk of bleeding is higher with warfarin than with aspirin. You will need frequent blood tests to make sure you are taking the safest amount.  New anticoagulants. Several new drugs have been  approved. They are all taken by mouth. Studies show that these drugs work as well as warfarin. They do not require blood testing. They may cause less bleeding risk than warfarin. WHAT DO I NEED TO REMEMBER WHEN TAKING ANTICOAGULANT THERAPY? Anticoagulant therapy decreases your risk of forming a blood clot, but it increases your risk of bleeding. Work closely with your health care provider to make sure you are taking your medicine safely. These tips can help:  Learn ways to reduce your risk of bleeding.  If you are taking warfarin: ? Have blood tests as ordered by your health care provider. ? Do not make any sudden changes to your diet. Vitamin K in your diet can make warfarin less effective. ? Do not get pregnant. This medicine may cause birth defects.  Take your medicine at the same time every day. If you forget to take your medicine, take it as soon as you remember. If you miss a whole day, do not double your dose of medicine. Take your normal dose and call your health care provider to check in.  Do not stop taking your medicine on your own.  Tell your health care provider before you start taking any new medicine, vitamin, or herbal product. Some of these could interfere with your therapy.  Tell all of your health care providers that you are on anticoagulant therapy.  Do not have surgery, medical procedures, or dental work until you tell your health care provider that you are on anticoagulant therapy. WHAT CAN AFFECT HOW ANTICOAGULANTS WORK? Certain foods, vitamins, medicines, supplements, and herbal medicines  change the way that anticoagulant therapy works. They may increase or decrease the effects of your anticoagulant therapy. Either result can be dangerous for you.  Many over-the-counter medicines for pain, colds, or stomach problems interfere with anticoagulant therapy. Take these only as told by your health care provider.  Do not drink alcohol. It can interfere with your medicine and  increase your risk of an injury that causes bleeding.  If you are taking warfarin, do not begin eating more foods that contain vitamin K. These include leafy green vegetables. Ask your health care provider if you should avoid any foods. WHAT ARE SOME WAYS TO PREVENT BLEEDING? You can prevent bleeding by taking certain precautions:  Be extra careful when you use knives, scissors, or other sharp objects.  Use an electric razor instead of a blade.  Do not use toothpicks.  Use a soft toothbrush.  Wear shoes that have nonskid soles.  Use bath mats and handrails in your bathroom.  Wear gloves while you do yard work.  Wear a helmet when you ride a bike.  Wear your seat belt.  Prevent falls by removing loose rugs and extension cords from areas where you walk.  Do not play contact sports or participate in other activities that have a high risk of injury. [place] PROVIDER? Call your health care provider if:  You miss a dose of medicine: ? And you are not sure what to do. ? For more than one day.  You have: ? Menstrual bleeding that is heavier than normal. ? Blood in your urine. ? A bloody nose or bleeding gums. ? Easy bruising. ? Blood in your stool (feces) or have black and tarry stool. ? Side effects from your medicine.  You feel weak or dizzy.  You become pregnant. Seek immediate medical care if:  You have bleeding that will not stop.  You have sudden and severe headache or belly pain.  You vomit or you cough up bright red blood.  You have a severe blow to your head. WHAT ARE SOME QUESTIONS TO ASK MY HEALTH CARE PROVIDER?  What is the best anticoagulant therapy for my condition?  What side effects should I watch for?  When should I take my medicine? What should I do if I forget to take it?  Will I need to have regular blood tests?  Do I need to change my diet? Are there foods or drinks that I should avoid?  What activities are  safe for me?  What should I do if I want to get pregnant? This information is not intended to replace advice given to you by your health care provider. Make sure you discuss any questions you have with your health care provider. Document Released: 10/18/2015 Document Reviewed: 10/18/2015 Elsevier Interactive Patient Education  2017 [employer].

## 2018-06-27 NOTE — Progress Notes (Signed)
Provider: Romelia Bromell FNP-C  Gayland Curry, DO  Patient Care Team: Gayland Curry, DO as PCP - General (Geriatric Medicine) Tanda Rockers, MD as Consulting Physician (Pulmonary Disease) Deveron Furlong, NP as Nurse Practitioner (Psychiatry)  Extended Emergency Contact Information Primary Emergency Contact: Brunelli,Roberta(Bobbi) Address: [place], [place] [address] [name] Phone: [phone] Mobile Phone: [phone] Relation: Daughter Secondary Emergency Contact: Cristo,David Address: [address]           [place], [place] [address] [place] Phone: [phone] Work Phone: [phone] Relation: Son  Goals of care: Advanced Directive information Advanced Directives 06/20/2018  Does Patient Have a Medical Advance Directive? Yes  Type of Advance Directive Out of facility DNR (pink MOST or yellow form)  Does patient want to make changes to medical advance directive? No - Patient declined  Copy of [place] in Chart? -  Would patient like information on creating a medical advance directive? -  Pre-existing out of facility DNR order (yellow form or pink MOST form) Yellow form placed in chart (order not valid for inpatient use)     Chief Complaint  Patient presents with  . Follow-up    PT/INR    HPI:  Pt is a 83 y.o. female seen today at [facility] office for an acute visit for INR check.she is here with her care giver.Her previous INR was 1.6 she states taking her coumadin as directed currently on coumadin 5 mg tablet on Tuesday,thursday,saturday,"Sunday and coumadin 6 mg tablet Monday,wednesday and Friday.she denies any signs of bleeding,palpitation,chest pain or recent acute illness.  Past Medical History:  Diagnosis Date  . Anemia   . Anxiety   . Arthritis    hands, spine   . Blood transfusion   . Cervicalgia   . Constipation   . Cough    Wert-onset 08/2009, as of 2014- resolved   .  Deep venous thrombosis (HCC)    post op, rec'd/needed  blood thinner   . Depression   . Depression with anxiety   . Dysrhythmia   . Falls frequently    pt. reports that she was falling 3 times a day, has had PT at a facility for a while & now is getting home PT 2-3 times/ week   . Headache(784.0) 09/27/2011  . History of stress test    15-20 yrs.ago   . Hypotension   . Hypothyroidism   . Lumbar radiculopathy 09/26/2011  . Pneumonia    never been in hosp. for pneumonia   . Prediabetes   . Right hand fracture    Monday  . Shortness of breath   . Sleep apnea    doesn't use CPAP any longer, since weight loss   Past Surgical History:  Procedure Laterality Date  . ANTERIOR CERVICAL DECOMP/DISCECTOMY FUSION N/A 07/09/2013   Procedure: ANTERIOR CERVICAL DECOMPRESSION/DISCECTOMY FUSION 3 LEVELS;  Surgeon: Mark Leonard Dumonski, MD;  Location: [facility] OR;  Service: Orthopedics;  Laterality: N/A;  Anterior cervical decompression fusion, cervical 3-4, cervical 4-5-, cervical 5-6 with instrumentation, allograft.  . APPENDECTOMY    . BACK SURGERY     20" 00  . BREAST EXCISIONAL BIOPSY     breast bxs on left x 2, breast bx of right x 1-all benign  . EYE SURGERY     cataracts bilateral /w IOL  . HERNIA REPAIR     umbilical hernia- [id number]  . JOINT REPLACEMENT  [id number]   bilateral  . LAPAROSCOPIC OVARIAN CYSTECTOMY     not done by laparscopy-abdominal incision  . REPLACEMENT TOTAL KNEE BILATERAL    . TONSILLECTOMY      Allergies  Allergen Reactions  . Clindamycin Hcl Other (See Comments)    REACTION: swelling, pt. Reports that she had a 16 lb. weightgain in one day    Outpatient Encounter Medications as of 06/27/2018  Medication Sig  . Brexpiprazole 1 MG TABS Take 1 tablet by mouth at bedtime.  . busPIRone (BUSPAR) 15 MG tablet Take 15 mg by mouth daily.  [name] CALCIUM CITRATE PO Take 600 mg by mouth 2 (two) times daily.  . clonazePAM (KLONOPIN) 0.5 MG tablet Take 1 tablet by mouth as needed in  middle of the nite to go back to sleep  . CRANBERRY EXTRACT PO Take 2 tablets by mouth daily.  . DULoxetine (CYMBALTA) 60 MG capsule Take 2 capsules (120 mg total) by mouth daily.  . furosemide (LASIX) 40 MG tablet Take 0.5 tablets (20 mg total) by mouth daily.  [name] levothyroxine (SYNTHROID, LEVOTHROID) 50 MCG tablet TAKE ONE TABLET BY MOUTH ONCE DAILY 30 MINUTES BEFORE BREAKFAST FOR THYROID  . mirabegron ER (MYRBETRIQ) 25 MG TB24 tablet Take 1 tablet (25 mg total) by mouth daily.  . mirtazapine (REMERON) 15 MG tablet Take 7.5 mg by mouth at bedtime.  . Multiple Vitamins-Minerals (MULTIVITAMIN PO) Take 1 tablet by mouth.  . oxyCODONE-acetaminophen (PERCOCET) 10-325 MG tablet Take 1 tab every 4 hrs as needed for moderate pain and 2 tabs every 4 hrs as needed for severe pain  . traZODone (DESYREL) 100 MG tablet Take 2 tablets by mouth at bedtime  . warfarin (COUMADIN) 1 MG tablet Take 1 tablet (1 mg total) by mouth daily.  [name] warfarin (COUMADIN) 5 MG tablet Take 5 mg by mouth daily.  . meloxicam (MOBIC) 15 MG tablet TAKE 1 TABLET BY MOUTH EVERY DAY (Patient not taking: Reported on 06/27/2018)   No facility-administered encounter medications on file as of 06/27/2018.     Review of Systems  Constitutional: Negative for chills, fatigue, fever and unexpected weight change.  HENT: Negative for congestion, nosebleeds, rhinorrhea, sinus pressure, sinus pain, sneezing and sore throat.   Eyes: Negative for discharge, redness and itching.  Respiratory: Negative for cough, chest tightness, shortness of breath and wheezing.   Cardiovascular: Negative for chest pain, palpitations and leg swelling.  Gastrointestinal: Negative for abdominal distention, abdominal pain, blood in stool, constipation, diarrhea, nausea and vomiting.  Genitourinary: Negative for dysuria, flank pain, frequency, hematuria, urgency and vaginal bleeding.  Skin: Negative for color change, pallor, rash and wound.  Neurological: Negative for  dizziness and headaches.  Hematological: Does not bruise/bleed easily.  Psychiatric/Behavioral: Negative for agitation, confusion and sleep disturbance. The patient is not nervous/anxious.     Immunization History  Administered Date(s) Administered  . Influenza Split 08/14/2013  . Influenza Whole 08/20/2009  . Influenza, High Dose Seasonal PF 09/11/2017  . Influenza,inj,Quad PF,6+ Mos 08/04/2014, 09/06/2015, 07/10/2016  . Pneumococcal Conjugate-13 01/14/2015  . Pneumococcal Polysaccharide-23 07/11/2013  . Td 11/20/2005, 11/27/2016   Pertinent  Health Maintenance Due  Topic Date Due  . OPHTHALMOLOGY EXAM  06/16/1945  . URINE MICROALBUMIN  11/27/2017  . INFLUENZA VACCINE  06/20/2018  . FOOT EXAM  10/18/2018  . HEMOGLOBIN A1C  12/04/2018  . DEXA SCAN  Completed  . PNA vac Low Risk Adult  Completed   Fall Risk  06/20/2018 05/02/2018 04/04/2018 02/14/2018 12/03/2017  Falls  in the past year? No No Yes No Yes  Number falls in past yr: - - 2 or more - 2 or more  Comment - - - - -  Injury with Fall? - - No - No  Risk for fall due to : - - - - -  Follow up - - - - -    Vitals:   06/27/18 1013  BP: 102/60  Pulse: 67  Resp: 16  Temp: 99 F (37.2 C)  TempSrc: Oral  SpO2: 98%  Weight: 196 lb 4.8 oz (89 kg)  Height: 5\' 1"  (1.549 m)   Body mass index is 37.09 kg/m. Physical Exam  Constitutional: She is oriented to person, place, and time.  Obese elderly in no acute distress   HENT:  Head: Normocephalic.  Right Ear: External ear normal.  Left Ear: External ear normal.  Mouth/Throat: Oropharynx is clear and moist. No oropharyngeal exudate.  Eyes: Pupils are equal, round, and reactive to light. Conjunctivae and EOM are normal. Right eye exhibits no discharge. Left eye exhibits no discharge. No scleral icterus.  Neck: Normal range of motion. No JVD present. No thyromegaly present.  Cardiovascular: Normal rate, regular rhythm, normal heart sounds and intact distal pulses. Exam reveals  no gallop and no friction rub.  No murmur heard. Pulmonary/Chest: Effort normal and breath sounds normal. No respiratory distress. She has no wheezes. She has no rales.  Abdominal: Soft. Bowel sounds are normal. She exhibits no distension and no mass. There is no tenderness. There is no rebound and no guarding.  Musculoskeletal: Normal range of motion. She exhibits no edema or tenderness.  Lymphadenopathy:    She has no cervical adenopathy.  Neurological: She is oriented to person, place, and time.  Skin: Skin is warm and dry. No rash noted. No erythema. No pallor.  Psychiatric: She has a normal mood and affect. Her behavior is normal. Judgment and thought content normal.  Vitals reviewed.   Labs reviewed: Recent Labs    10/18/17 0949 06/03/18 0955  NA 138 137  K 4.2 3.9  CL 99 99  CO2 28 31  GLUCOSE 108* 100*  BUN 29* 30*  CREATININE 0.93* 1.08*  CALCIUM 9.3 9.4   Recent Labs    10/18/17 0949 06/03/18 0955  AST 15 15  ALT 8 7  BILITOT 0.4 0.5  PROT 6.6 6.5   Recent Labs    10/18/17 0949 06/03/18 0955  WBC 6.9 4.7  NEUTROABS 4,816 2,966  HGB 12.7 13.5  HCT 38.3 39.9  MCV 85.7 89.7  PLT 237 219   Lab Results  Component Value Date   TSH 1.23 06/03/2018   Lab Results  Component Value Date   HGBA1C 5.2 06/03/2018   Lab Results  Component Value Date   CHOL 186 03/01/2017   HDL 50 (L) 03/01/2017   LDLCALC 108 (H) 03/01/2017   TRIG 139 03/01/2017   CHOLHDL 3.7 03/01/2017    Significant Diagnostic Results in last 30 days:  No results found.  Assessment/Plan 1. Long term (current) use of anticoagulants INR today was 1.4 recheck 1.3 previous 1.6 Goal 2-3.states taking medication as directed.Will increase coumadin to 5.5 mg tablet on Tuesday,thursday,saturday,Sunday and continue on coumadin 6 mg tablet Monday,wednesday and Friday.Notify provider any signs of bleeding.  - POC INR 07/04/2018  2. Paroxysmal A-fib  HR regular.continue increase coumadin to 5.5  mg tablet on Tuesday,thursday,saturday,Sunday and continue on coumadin 6 mg tablet Monday,wednesday and Friday.continue to monitor.    Family/ staff  Communication: Reviewed plan of care with patient and care giver.  Labs/tests ordered: INR 07/04/2018   Sandrea Hughs, NP

## 2018-07-04 ENCOUNTER — Ambulatory Visit (INDEPENDENT_AMBULATORY_CARE_PROVIDER_SITE_OTHER): Payer: Medicare Other | Admitting: Family

## 2018-07-04 ENCOUNTER — Encounter: Payer: Self-pay | Admitting: Family

## 2018-07-04 VITALS — BP 136/78 | HR 72 | Temp 100.5°F | Resp 16 | Ht 61.0 in | Wt 197.6 lb

## 2018-07-04 DIAGNOSIS — Z7901 Long term (current) use of anticoagulants: Secondary | ICD-10-CM

## 2018-07-04 DIAGNOSIS — I480 Paroxysmal atrial fibrillation: Secondary | ICD-10-CM | POA: Diagnosis not present

## 2018-07-04 DIAGNOSIS — R509 Fever, unspecified: Secondary | ICD-10-CM

## 2018-07-04 LAB — POCT INR: INR: 1.6 — AB (ref 2.0–3.0)

## 2018-07-04 NOTE — Progress Notes (Signed)
Provider: Dinah Ngetich FNP-C  Gayland Curry, DO  Patient Care Team: Gayland Curry, DO as PCP - General (Geriatric Medicine) Tanda Rockers, MD as Consulting Physician (Pulmonary Disease) Deveron Furlong, NP as Nurse Practitioner (Psychiatry)  Extended Emergency Contact Information Primary Emergency Contact: Shapley,Roberta(Bobbi) Address: [place], [place] [address] [name] Phone: [phone] Mobile Phone: [phone] Relation: Daughter Secondary Emergency Contact: Sipos,David Address: [address]           [place], [place] [address] [place] Phone: [phone] Work Phone: [phone] Relation: Son   Goals of care: Advanced Directive information Advanced Directives 06/20/2018  Does Patient Have a Medical Advance Directive? Yes  Type of Advance Directive Out of facility DNR (pink MOST or yellow form)  Does patient want to make changes to medical advance directive? No - Patient declined  Copy of [place] in Chart? -  Would patient like information on creating a medical advance directive? -  Pre-existing out of facility DNR order (yellow form or pink MOST form) Yellow form placed in chart (order not valid for inpatient use)     Chief Complaint  Patient presents with  . Acute Visit    INR check     HPI:  Pt is a 83 y.o. female seen today at [facility] office for an acute visit for INR check.she is here today with her daughter.Her INR today is subtherapeutic at 1.6 with a goal of 2-3. Her previous INR one week ago was 1.3 she is on coumadin 5.5 mg tablet on  Tuesday, Thursday,saturday,sunday and coumadin 6 mg tablet on Monday,Wednesday and Friday.no signs of bleeding or palpitation.Her Temperature today was 100.5 she tells me that several weeks ago she had a pill stuck on her throat which she finally got relief yesterday.she denies any cough,sore throat, shortness of breath, wheezing or  urinary tract infections symptoms.her daughter states no new concerns.    Past Medical History:  Diagnosis Date  . Anemia   . Anxiety   . Arthritis    hands, spine   . Blood transfusion   . Cervicalgia   . Constipation   . Cough    Wert-onset 08/2009, as of 2014- resolved   . Deep venous thrombosis ([place])    post op, rec'd/needed  blood thinner   . Depression   . Depression with anxiety   . Dysrhythmia   . Falls frequently    pt. reports that she was falling 3 times a day, has had PT at a facility for a while & now is getting home PT 2-3 times/ week   . Headache(784.0) 09/27/2011  . History of stress test    15-20 yrs.ago   . Hypotension   . Hypothyroidism   . Lumbar radiculopathy 09/26/2011  . Pneumonia    never been in hosp. for pneumonia   . Prediabetes   . Right hand fracture    Monday  . Shortness of breath   . Sleep apnea    doesn't use CPAP any longer, since weight loss   Past Surgical History:  Procedure Laterality Date  . ANTERIOR CERVICAL DECOMP/DISCECTOMY FUSION N/A 07/09/2013   Procedure: ANTERIOR CERVICAL DECOMPRESSION/DISCECTOMY FUSION 3 LEVELS;  Surgeon: Sinclair Ship, MD;  Location: [facility];  Service: Orthopedics;  Laterality: N/A;  Anterior cervical decompression fusion, cervical 3-4, cervical 4-5-, cervical 5-6 with instrumentation, allograft.  . APPENDECTOMY    .  BACK SURGERY     2000  . BREAST EXCISIONAL BIOPSY     breast bxs on left x 2, breast bx of right x 1-all benign  . EYE SURGERY     cataracts bilateral /w IOL  . HERNIA REPAIR     umbilical hernia- [id number]  . JOINT REPLACEMENT  2006,2007   bilateral  . LAPAROSCOPIC OVARIAN CYSTECTOMY     not done by laparscopy-abdominal incision  . REPLACEMENT TOTAL KNEE BILATERAL    . TONSILLECTOMY      Allergies  Allergen Reactions  . Clindamycin Hcl Other (See Comments)    REACTION: swelling, pt. Reports that she had a 16 lb. weightgain in one day    Outpatient Encounter Medications as of  07/04/2018  Medication Sig  . Brexpiprazole 1 MG TABS Take 1 tablet by mouth at bedtime.  . busPIRone (BUSPAR) 15 MG tablet Take 15 mg by mouth daily.  [name] CALCIUM CITRATE PO Take 600 mg by mouth 2 (two) times daily.  . clonazePAM (KLONOPIN) 0.5 MG tablet Take 1 tablet by mouth as needed in middle of the nite to go back to sleep  . CRANBERRY EXTRACT PO Take 2 tablets by mouth daily.  . DULoxetine (CYMBALTA) 60 MG capsule Take 2 capsules (120 mg total) by mouth daily.  . furosemide (LASIX) 40 MG tablet Take 0.5 tablets (20 mg total) by mouth daily.  [name] levothyroxine (SYNTHROID, LEVOTHROID) 50 MCG tablet TAKE ONE TABLET BY MOUTH ONCE DAILY 30 MINUTES BEFORE BREAKFAST FOR THYROID  . meloxicam (MOBIC) 15 MG tablet TAKE 1 TABLET BY MOUTH EVERY DAY  . mirabegron ER (MYRBETRIQ) 25 MG TB24 tablet Take 1 tablet (25 mg total) by mouth daily.  . mirtazapine (REMERON) 15 MG tablet Take 7.5 mg by mouth at bedtime.  . Multiple Vitamins-Minerals (MULTIVITAMIN PO) Take 1 tablet by mouth.  . oxyCODONE-acetaminophen (PERCOCET) 10-325 MG tablet Take 1 tab every 4 hrs as needed for moderate pain and 2 tabs every 4 hrs as needed for severe pain  . traZODone (DESYREL) 100 MG tablet Take 2 tablets by mouth at bedtime  . warfarin (COUMADIN) 1 MG tablet Take 1 tablet (1 mg total) by mouth daily.  [name] warfarin (COUMADIN) 5 MG tablet Take 5 mg by mouth daily.   No facility-administered encounter medications on file as of 07/04/2018.     Review of Systems  Constitutional: Negative for appetite change, chills and fatigue.       Temp 100.5 this visit   HENT: Positive for hearing loss. Negative for congestion, nosebleeds, rhinorrhea, sinus pressure, sinus pain, sneezing and sore throat.   Eyes: Negative for pain, discharge and redness.  Respiratory: Negative for cough, chest tightness, shortness of breath and wheezing.   Cardiovascular: Negative for chest pain, palpitations and leg swelling.  Gastrointestinal: Negative for  abdominal distention, abdominal pain, constipation, diarrhea, nausea and vomiting.  Genitourinary: Negative for dysuria, flank pain, frequency, hematuria and urgency.  Musculoskeletal: Positive for gait problem.  Skin: Negative for color change, pallor, rash and wound.  Neurological: Negative for dizziness, light-headedness and headaches.  Hematological: Does not bruise/bleed easily.  Psychiatric/Behavioral: Negative for agitation and sleep disturbance. The patient is not nervous/anxious.     Immunization History  Administered Date(s) Administered  . Influenza Split 08/14/2013  . Influenza Whole 08/20/2009  . Influenza, High Dose Seasonal PF 09/11/2017  . Influenza,inj,Quad PF,6+ Mos 08/04/2014, 09/06/2015, 07/10/2016  . Pneumococcal Conjugate-13 01/14/2015  . Pneumococcal Polysaccharide-23 07/11/2013  . Td 11/20/2005, 11/27/2016   Pertinent  Health Maintenance Due  Topic Date Due  . OPHTHALMOLOGY EXAM  06/16/1945  . URINE MICROALBUMIN  11/27/2017  . INFLUENZA VACCINE  06/20/2018  . FOOT EXAM  10/18/2018  . HEMOGLOBIN A1C  12/04/2018  . DEXA SCAN  Completed  . PNA vac Low Risk Adult  Completed   Fall Risk  06/20/2018 05/02/2018 04/04/2018 02/14/2018 12/03/2017  Falls in the past year? No No Yes No Yes  Number falls in past yr: - - 2 or more - 2 or more  Comment - - - - -  Injury with Fall? - - No - No  Risk for fall due to : - - - - -  Follow up - - - - -    Vitals:   07/04/18 1606  BP: 136/78  Pulse: 72  Resp: 16  Temp: (!) 100.5 F (38.1 C)  TempSrc: Oral  SpO2: 94%  Weight: 197 lb 9.6 oz (89.6 kg)  Height: 5\' 1"  (1.549 m)   Body mass index is 37.34 kg/m. Physical Exam  Constitutional: She is oriented to person, place, and time.  Elderly in no acute distress   HENT:  Head: Normocephalic.  Right Ear: External ear normal.  Left Ear: External ear normal.  Mouth/Throat: Oropharynx is clear and moist. No oropharyngeal exudate.  Wears hearing aids   Eyes: Pupils are  equal, round, and reactive to light. Conjunctivae and EOM are normal. Right eye exhibits no discharge. Left eye exhibits no discharge. No scleral icterus.  Neck: Normal range of motion. No JVD present. No thyromegaly present.  Cardiovascular: Normal rate, regular rhythm, normal heart sounds and intact distal pulses. Exam reveals no gallop and no friction rub.  No murmur heard. Pulmonary/Chest: Effort normal and breath sounds normal. No respiratory distress. She has no wheezes. She has no rales.  Abdominal: Soft. Bowel sounds are normal. She exhibits no distension and no mass. There is no tenderness. There is no rebound and no guarding.  Musculoskeletal: She exhibits no edema or tenderness.  Moves x 4 extremities.ambulates with cane   Lymphadenopathy:    She has no cervical adenopathy.  Neurological: She is oriented to person, place, and time. Gait abnormal.  Skin: Skin is warm and dry. No rash noted. No erythema. No pallor.  Psychiatric: She has a normal mood and affect. Her behavior is normal. Judgment and thought content normal.  Vitals reviewed.   Labs reviewed: Recent Labs    10/18/17 0949 06/03/18 0955  NA 138 137  K 4.2 3.9  CL 99 99  CO2 28 31  GLUCOSE 108* 100*  BUN 29* 30*  CREATININE 0.93* 1.08*  CALCIUM 9.3 9.4   Recent Labs    10/18/17 0949 06/03/18 0955  AST 15 15  ALT 8 7  BILITOT 0.4 0.5  PROT 6.6 6.5   Recent Labs    10/18/17 0949 06/03/18 0955  WBC 6.9 4.7  NEUTROABS 4,816 2,966  HGB 12.7 13.5  HCT 38.3 39.9  MCV 85.7 89.7  PLT 237 219   Lab Results  Component Value Date   TSH 1.23 06/03/2018   Lab Results  Component Value Date   HGBA1C 5.2 06/03/2018   Lab Results  Component Value Date   CHOL 186 03/01/2017   HDL 50 (L) 03/01/2017   LDLCALC 108 (H) 03/01/2017   TRIG 139 03/01/2017   CHOLHDL 3.7 03/01/2017    Significant Diagnostic Results in last 30 days:  No results found.  Assessment/Plan 1. Long term (current) use of  anticoagulants  INR today is subtherapeutic at 1.6 with a goal of 2-3. Her previous INR one week ago was 1.3.she is on coumadin 5.5 mg tablet on Tuesday, Thursday,saturday,sunday and coumadin 6 mg tablet on Monday,Wednesday and Friday.no signs of bleeding.change coumadin to 6 mg tablet one by mouth daily.Recheck INR 07/11/2018  - POCT INR  2. Fever, unspecified fever cause Temp 100.5 unknown origin.denies any acute issues.Lungs CTA.encourage to monitor Temp at home and Notify provider's office for Temp > 100.5.may take OTC tylenol 500 mg tablet every 8 hours as needed for fever.will rule out infectious etiologies.  - CBC with Differential/Platelets.  3.Afib  No palpitation.INR today subtherapeutic.change coumadin as above.  Family/ staff Communication: Reviewed plan of care with patient and daughter. Labs/tests ordered: CBC/diff   Dinah C Ngetich, NP

## 2018-07-04 NOTE — Patient Instructions (Addendum)
1. Take coumadin 6 mg tablet one by mouth once daily 2. May take tylenol 500 mg tablet one by mouth every 8 hours as needed for fever 3.Notify provider for fever >100.5 or chills,any cough or signs of urinary tract infections.

## 2018-07-05 LAB — CBC WITH DIFFERENTIAL/PLATELET
BASOS PCT: 0.8 %
Basophils Absolute: 50 cells/uL (ref 0–200)
EOS ABS: 132 {cells}/uL (ref 15–500)
Eosinophils Relative: 2.1 %
HCT: 38.7 % (ref 35.0–45.0)
Hemoglobin: 13.1 g/dL (ref 11.7–15.5)
Lymphs Abs: 1928 cells/uL (ref 850–3900)
MCH: 30.2 pg (ref 27.0–33.0)
MCHC: 33.9 g/dL (ref 32.0–36.0)
MCV: 89.2 fL (ref 80.0–100.0)
MONOS PCT: 8.1 %
MPV: 10.3 fL (ref 7.5–12.5)
Neutro Abs: 3679 cells/uL (ref 1500–7800)
Neutrophils Relative %: 58.4 %
PLATELETS: 254 10*3/uL (ref 140–400)
RBC: 4.34 10*6/uL (ref 3.80–5.10)
RDW: 12.7 % (ref 11.0–15.0)
TOTAL LYMPHOCYTE: 30.6 %
WBC mixed population: 510 cells/uL (ref 200–950)
WBC: 6.3 10*3/uL (ref 3.8–10.8)

## 2018-07-08 ENCOUNTER — Ambulatory Visit
Admission: RE | Admit: 2018-07-08 | Discharge: 2018-07-08 | Disposition: A | Discharge status: Home / Self Care | Payer: Medicare Other | Source: Ambulatory Visit | Attending: Internal Medicine | Admitting: Internal Medicine

## 2018-07-08 ENCOUNTER — Ambulatory Visit (INDEPENDENT_AMBULATORY_CARE_PROVIDER_SITE_OTHER): Payer: Medicare Other | Admitting: Internal Medicine

## 2018-07-08 ENCOUNTER — Encounter: Payer: Self-pay | Admitting: Internal Medicine

## 2018-07-08 VITALS — BP 132/76 | HR 73 | Temp 98.7°F | Ht 61.0 in | Wt 196.4 lb

## 2018-07-08 DIAGNOSIS — T17800S Unspecified foreign body in other parts of respiratory tract causing asphyxiation, sequela: Secondary | ICD-10-CM

## 2018-07-08 DIAGNOSIS — R509 Fever, unspecified: Secondary | ICD-10-CM | POA: Diagnosis not present

## 2018-07-08 NOTE — Patient Instructions (Addendum)
The chest Xray will be performed at [facility], [address]  Please keep a temperature diary. Monitor your temperature if you have chills or sweats. Monitor for any of the symptoms or signs we discussed during your office visit.

## 2018-07-08 NOTE — Progress Notes (Signed)
This is a Chief Financial Officer office visit  follow up for specific acute issue of fever.  The patient states she feels she has encephalitis.  Interim medical record and care since last [place] visit was updated with review of diagnostic studies and change in clinical status since last visit were documented.  HPI: The patient was seen 07/04/2018 for fevers up to 100.8 for over a week.  At that time she had not been having chills but this has occurred this week.  She questions encephalitis as she has the same symptoms she had in 1989 when she was hospitalized for this.  She believed it was transmitted by mosquito bite.  She has had no definite exposure to vectors such as mosquitoes or ticks at this time.  She gives a history of possibly aspirating a pill 2 weeks ago  followed by cough with clear sputum.  At this time she denies any upper or lower respiratory tract symptoms.  She describes some frontal headache as if someone "pumped air in my head".  Describes stiffness in her neck and back with the fever. She denies any GI or GU symptoms.    Extensive labs were performed.  White count and differential were normal.  INR was subtherapeutic at 1.6. Warfarin dose was adjusted with follow-up of PT/INR 8/22.  Significant past history includes sleep apnea in 2011 she was evaluated by Dr. Melvyn Novas for an abnormal CT chest scan with subtle granulomatous changes.  Chest x-ray was normal.  Review of systems:  Constitutional: No significant weight change  Eyes: No redness, discharge, pain, vision change ENT/mouth: No nasal congestion,  purulent discharge, earache, change in hearing, sore throat  Cardiovascular: No chest pain, palpitations, paroxysmal nocturnal dyspnea, claudication, edema  Respiratory: No cough, sputum production, hemoptysis, DOE Gastrointestinal: No heartburn, dysphagia, abdominal pain, nausea /vomiting, rectal bleeding, melena, change in bowels Genitourinary: No dysuria, hematuria, pyuria,  incontinence, nocturia Dermatologic: No rash, pruritus, change in appearance of skin Neurologic: No dizziness, syncope, seizures, numbness, tingling Endocrine: No change in hair/skin/nails, excessive thirst, excessive hunger, excessive urination  Hematologic/lymphatic: No significant bruising, lymphadenopathy, abnormal bleeding Allergy/immunology: No itchy/watery eyes, significant sneezing, urticaria, angioedema  Physical exam:  Pertinent or positive findings: She appears fatigued but in no acute distress.  She is edentulous but not wearing the lower plate.  She describes some discomfort with range of motion of the neck and there is decrease in active range of motion.  With passive range of motion there is no evidence of meningismus clinically. Breath sounds are slightly asymmetric;ie slightly decreased on the left compared to the right.  She has nonpitting edema of the feet.  Pedal pulses are decreased.  She has scattered keratotic lesions of the skin.  General appearance:  no acute distress, increased work of breathing is present.   Lymphatic: No lymphadenopathy about the head, neck, axilla. Eyes: No conjunctival inflammation or lid edema is present. There is no scleral icterus. Ears:  External ear exam shows no significant lesions or deformities.   Nose:  External nasal examination shows no deformity or inflammation. Nasal mucosa are pink and moist without lesions, exudates Oral exam:  Lips and gums are healthy appearing. There is no oropharyngeal erythema or exudate. Neck:  No thyromegaly, masses, tenderness noted.    Heart:  Normal rate and regular rhythm. S1 and S2 normal without gallop, murmur, click, rub .  Lungs:  without wheezes, rhonchi, rales, rubs. Abdomen: Bowel sounds are normal. Abdomen is soft and nontender with no  organomegaly, hernias, masses. GU: Deferred  Extremities:  No cyanosis, clubbing  Neurologic exam : Strength equal  in upper & lower extremities Balance, Rhomberg,  finger to nose testing could not be completed due to clinical state Skin: Warm & dry w/o tenting. No significant rash.  See summary under each active problem in the Problem List with associated updated therapeutic plan

## 2018-07-09 ENCOUNTER — Encounter: Payer: Self-pay | Admitting: Internal Medicine

## 2018-07-09 NOTE — Assessment & Plan Note (Signed)
Dr Gustavus Bryant 2011 notes reviewed.  Chest x-ray was normal, but CT revealed subtle granulomatous changes apparently

## 2018-07-11 ENCOUNTER — Ambulatory Visit (INDEPENDENT_AMBULATORY_CARE_PROVIDER_SITE_OTHER): Payer: Medicare Other | Admitting: Family

## 2018-07-11 VITALS — BP 130/80 | HR 73 | Temp 99.4°F | Resp 16 | Ht 61.0 in | Wt 198.4 lb

## 2018-07-11 DIAGNOSIS — Z7901 Long term (current) use of anticoagulants: Secondary | ICD-10-CM | POA: Diagnosis not present

## 2018-07-11 DIAGNOSIS — R399 Unspecified symptoms and signs involving the genitourinary system: Secondary | ICD-10-CM | POA: Diagnosis not present

## 2018-07-11 DIAGNOSIS — L853 Xerosis cutis: Secondary | ICD-10-CM

## 2018-07-11 DIAGNOSIS — I480 Paroxysmal atrial fibrillation: Secondary | ICD-10-CM | POA: Diagnosis not present

## 2018-07-11 DIAGNOSIS — G894 Chronic pain syndrome: Secondary | ICD-10-CM | POA: Diagnosis not present

## 2018-07-11 LAB — POCT INR: INR: 2.6 (ref 2.0–3.0)

## 2018-07-11 MED ORDER — AQUAPHOR EX OINT: TOPICAL_OINTMENT | Freq: Two times a day (BID) | CUTANEOUS | 0 refills | Status: DC | PRN

## 2018-07-11 MED ORDER — OXYCODONE-ACETAMINOPHEN 10-325 MG PO TABS
ORAL_TABLET | ORAL | 0 refills | Status: DC
Start: 2018-07-11 — End: 2018-09-02

## 2018-07-11 NOTE — Patient Instructions (Addendum)
1.Continue on coumadin 6 mg tablet daily. 2. Next appointment 07/18/2018 to recheck  INR  3. Notify provider if running any fever > 100.5  4. Urine specimen collected today will call you with results.

## 2018-07-11 NOTE — Progress Notes (Signed)
Provider: Mikeala Girdler FNP-C  Gayland Curry, DO  Patient Care Team: Gayland Curry, DO as PCP - General (Geriatric Medicine) Tanda Rockers, MD as Consulting Physician (Pulmonary Disease) Deveron Furlong, NP as Nurse Practitioner (Psychiatry)  Extended Emergency Contact Information Primary Emergency Contact: Stormont,Roberta(Bobbi) Address: [place], [place] [address] [name] of [place] Phone: [phone] Mobile Phone: [phone] Relation: Daughter Secondary Emergency Contact: Mcluckie,David Address: [address]           [place], [place] [address] [place] Phone: [phone] Work Phone: [phone] Relation: Son   Goals of care: Advanced Directive information Advanced Directives 07/08/2018  Does Patient Have a Medical Advance Directive? Yes  Type of Advance Directive Out of facility DNR (pink MOST or yellow form)  Does patient want to make changes to medical advance directive? -  Copy of [place] in Chart? -  Would patient like information on creating a medical advance directive? -  Pre-existing out of facility DNR order (yellow form or pink MOST form) Yellow form placed in chart (order not valid for inpatient use)     Chief Complaint  Patient presents with  . Follow-up    INR check    HPI:  Pt is a 83 y.o. female seen today at [facility] for an acute visit for INR check.Her previous INR was 1.6 currently on coumadin 6 mg tablet daily.she states has been taking her medication as directed.Her INR check  Today is 2.6 goal 2-3 for Afib. She continues to have low grade fever 99.4.she was seen at [facility] 07/04/2018 for fever and chills.chest X-ray ordered results were negative.she complains of lower back pain that is different from her usual arthritic pain.she denies any radiation to lower extremities or numbness or tingling of legs. Also denies any burning or pain with urination.     Past  Medical History:  Diagnosis Date  . Anemia   . Anxiety   . Arthritis    hands, spine   . Blood transfusion   . Cervicalgia   . Constipation   . Cough    Wert-onset 08/2009, as of 2014- resolved   . Deep venous thrombosis ([place])    post op, rec'd/needed  blood thinner   . Depression   . Depression with anxiety   . Dysrhythmia   . Falls frequently    pt. reports that she was falling 3 times a day, has had PT at a facility for a while & now is getting home PT 2-3 times/ week   . Headache(784.0) 09/27/2011  . History of stress test    15-20 yrs.ago   . Hypotension   . Hypothyroidism   . Lumbar radiculopathy 09/26/2011  . Pneumonia    never been in hosp. for pneumonia   . Prediabetes   . Right hand fracture    Monday  . Shortness of breath   . Sleep apnea    doesn't use CPAP any longer, since weight loss   Past Surgical History:  Procedure Laterality Date  . ANTERIOR CERVICAL DECOMP/DISCECTOMY FUSION N/A 07/09/2013   Procedure: ANTERIOR CERVICAL DECOMPRESSION/DISCECTOMY FUSION 3 LEVELS;  Surgeon: Sinclair Ship, MD;  Location: [place];  Service: Orthopedics;  Laterality: N/A;  Anterior cervical decompression fusion, cervical 3-4, cervical 4-5-, cervical 5-6 with instrumentation, allograft.  . APPENDECTOMY    . BACK SURGERY     2000  . BREAST EXCISIONAL BIOPSY  breast bxs on left x 2, breast bx of right x 1-all benign  . EYE SURGERY     cataracts bilateral /w IOL  . HERNIA REPAIR     umbilical hernia- [id number]  . JOINT REPLACEMENT  2006,2007   bilateral  . LAPAROSCOPIC OVARIAN CYSTECTOMY     not done by laparscopy-abdominal incision  . REPLACEMENT TOTAL KNEE BILATERAL    . TONSILLECTOMY      Allergies  Allergen Reactions  . Clindamycin Hcl Other (See Comments)    REACTION: swelling, pt. Reports that she had a 16 lb. weightgain in one day    Outpatient Encounter Medications as of 07/11/2018  Medication Sig  . Brexpiprazole 1 MG TABS Take 1 tablet by mouth at  bedtime.  . busPIRone (BUSPAR) 15 MG tablet Take 15 mg by mouth daily.  [name] CALCIUM CITRATE PO Take 600 mg by mouth 2 (two) times daily.  . clonazePAM (KLONOPIN) 0.5 MG tablet Take 1 tablet by mouth as needed in middle of the nite to go back to sleep  . CRANBERRY EXTRACT PO Take 2 tablets by mouth daily.  . DULoxetine (CYMBALTA) 60 MG capsule Take 2 capsules (120 mg total) by mouth daily.  . furosemide (LASIX) 40 MG tablet Take 0.5 tablets (20 mg total) by mouth daily.  [name] levothyroxine (SYNTHROID, LEVOTHROID) 50 MCG tablet TAKE ONE TABLET BY MOUTH ONCE DAILY 30 MINUTES BEFORE BREAKFAST FOR THYROID  . mirabegron ER (MYRBETRIQ) 25 MG TB24 tablet Take 1 tablet (25 mg total) by mouth daily.  . mirtazapine (REMERON) 15 MG tablet Take 7.5 mg by mouth at bedtime.  . Multiple Vitamins-Minerals (MULTIVITAMIN PO) Take 1 tablet by mouth.  . oxyCODONE-acetaminophen (PERCOCET) 10-325 MG tablet Take 1 tab every 4 hrs as needed for moderate pain and 2 tabs every 4 hrs as needed for severe pain  . traZODone (DESYREL) 100 MG tablet Take 2 tablets by mouth at bedtime  . warfarin (COUMADIN) 1 MG tablet Take 1 tablet (1 mg total) by mouth daily.  [name] warfarin (COUMADIN) 5 MG tablet Take 5 mg by mouth daily.  . [DISCONTINUED] meloxicam (MOBIC) 15 MG tablet TAKE 1 TABLET BY MOUTH EVERY DAY   No facility-administered encounter medications on file as of 07/11/2018.     Review of Systems  Constitutional: Positive for fever. Negative for appetite change, chills and fatigue.  HENT: Positive for hearing loss. Negative for congestion, nosebleeds, rhinorrhea, sinus pressure, sinus pain, sneezing and sore throat.        Wears hearing aids   Eyes: Positive for visual disturbance. Negative for discharge and redness.       Wears eye glasses   Respiratory: Negative for cough, chest tightness, shortness of breath and wheezing.   Cardiovascular: Negative for chest pain, palpitations and leg swelling.  Gastrointestinal: Negative  for abdominal distention, abdominal pain, constipation, diarrhea, nausea and vomiting.  Genitourinary: Negative for dysuria, flank pain, frequency and urgency.  Musculoskeletal: Positive for back pain and gait problem.       Low back pain   Skin: Negative for color change, pallor, rash and wound.  Neurological: Negative for dizziness, weakness, light-headedness and numbness.  Hematological: Bruises/bleeds easily.  Psychiatric/Behavioral: Negative for agitation, confusion and sleep disturbance. The patient is not nervous/anxious.     Immunization History  Administered Date(s) Administered  . Influenza Split 08/14/2013  . Influenza Whole 08/20/2009  . Influenza, High Dose Seasonal PF 09/11/2017  . Influenza,inj,Quad PF,6+ Mos 08/04/2014, 09/06/2015, 07/10/2016  . Pneumococcal Conjugate-13 01/14/2015  .  Pneumococcal Polysaccharide-23 07/11/2013  . Td 11/20/2005, 11/27/2016   Pertinent  Health Maintenance Due  Topic Date Due  . OPHTHALMOLOGY EXAM  06/16/1945  . URINE MICROALBUMIN  11/27/2017  . INFLUENZA VACCINE  06/20/2018  . FOOT EXAM  10/18/2018  . HEMOGLOBIN A1C  12/04/2018  . DEXA SCAN  Completed  . PNA vac Low Risk Adult  Completed   Fall Risk  07/08/2018 06/20/2018 05/02/2018 04/04/2018 02/14/2018  Falls in the past year? No No No Yes No  Number falls in past yr: - - - 2 or more -  Comment - - - - -  Injury with Fall? - - - No -  Risk for fall due to : - - - - -  Follow up - - - - -    Vitals:   07/11/18 0938  BP: 130/80  Pulse: 73  Resp: 16  Temp: 99.4 F (37.4 C)  TempSrc: Oral  SpO2: 97%  Weight: 198 lb 6.4 oz (90 kg)  Height: 5\' 1"  (1.549 m)   Body mass index is 37.49 kg/m. Physical Exam  Constitutional: She is oriented to person, place, and time.  Obese elderly in no acute distress   HENT:  Head: Normocephalic.  Right Ear: External ear normal.  Left Ear: External ear normal.  Mouth/Throat: Oropharynx is clear and moist. No oropharyngeal exudate.    Bilateral hearing aids in place   Eyes: Pupils are equal, round, and reactive to light. Conjunctivae and EOM are normal. Right eye exhibits no discharge. Left eye exhibits no discharge. No scleral icterus.  Neck: Normal range of motion. No JVD present. No thyromegaly present.  Cardiovascular: Normal rate, regular rhythm, normal heart sounds and intact distal pulses. Exam reveals no gallop and no friction rub.  No murmur heard. Pulmonary/Chest: Effort normal and breath sounds normal. No respiratory distress. She has no wheezes. She has no rales.  Abdominal: Soft. Bowel sounds are normal. She exhibits no distension and no mass. There is no tenderness. There is no rebound and no guarding.  Musculoskeletal: She exhibits no edema or tenderness.  Moves x 4 extremities.unsteady gait ambulates with right hand cane   Lymphadenopathy:    She has no cervical adenopathy.  Neurological: She is oriented to person, place, and time. Gait abnormal.  HOH   Skin: Skin is warm and dry. No rash noted. No erythema. No pallor.  Bilateral lower extremities skin dry and scaly.  Psychiatric: She has a normal mood and affect. Her speech is normal and behavior is normal. Judgment and thought content normal.  Vitals reviewed.   Labs reviewed: Recent Labs    10/18/17 0949 06/03/18 0955  NA 138 137  K 4.2 3.9  CL 99 99  CO2 28 31  GLUCOSE 108* 100*  BUN 29* 30*  CREATININE 0.93* 1.08*  CALCIUM 9.3 9.4   Recent Labs    10/18/17 0949 06/03/18 0955  AST 15 15  ALT 8 7  BILITOT 0.4 0.5  PROT 6.6 6.5   Recent Labs    10/18/17 0949 06/03/18 0955 07/04/18 1652  WBC 6.9 4.7 6.3  NEUTROABS 4,816 2,966 3,679  HGB 12.7 13.5 13.1  HCT 38.3 39.9 38.7  MCV 85.7 89.7 89.2  PLT 237 219 254   Lab Results  Component Value Date   TSH 1.23 06/03/2018   Lab Results  Component Value Date   HGBA1C 5.2 06/03/2018   Lab Results  Component Value Date   CHOL 186 03/01/2017   HDL 50 (L)  03/01/2017   LDLCALC  108 (H) 03/01/2017   TRIG 139 03/01/2017   CHOLHDL 3.7 03/01/2017    Significant Diagnostic Results in last 30 days:  Dg Chest 2 View  Result Date: 07/08/2018 CLINICAL DATA:  Fever.  Recent aspiration EXAM: CHEST - 2 VIEW COMPARISON:  Chest radiograph 11/14/2017 FINDINGS: Anterior cervical spinal fusion hardware. Normal cardiac and mediastinal contours. No consolidative pulmonary opacities. No pleural effusion or pneumothorax. Thoracic spine degenerative changes. IMPRESSION: No acute cardiopulmonary process. Electronically Signed   By: Lovey Newcomer M.D.   On: 07/08/2018 17:24    Assessment/Plan 1. Long term (current) use of anticoagulants INR 2.6 today goal 2-3  For afib.No bleeding reported.continue on coumadin 6 mg tablet daily.Return 07/18/2018 for INR recheck.  - POCT INR  2. Urinary tract infection symptoms Low grade fever.Has had lower back pain.No CVA tenderness on exam.Previous CBC negative for infection. - Urinalysis, Routine w reflex microscopic - Urine Culture 3. Afib  No palpitation reported.continue on coumadin 6 mg tablet daily.return for INR check 07/18/2018.  4. Lower back pain without sciatica  Chronic pain verse possible Urinary tract infections symptoms.refilled Oxycodone-APAP 10-325 mg tablet one by mouth for moderate pain and 2 tablets for severe pain every 4 hours as needed for pain.Quantity 180 with no refill.    5. Dry Skin Dermatitis   Bilateral lower extremities skin dry and scaly.apply Aquaphor ointment to lower extremities twice daily until dryness resolve.   Family/ staff Communication: Reviewed plan of care with patient and care giver.  Labs/tests ordered: U/A and C/S rule out UTI   Sandrea Hughs, NP

## 2018-07-13 LAB — URINE CULTURE
MICRO NUMBER:: 91006388
Result:: NO GROWTH
SPECIMEN QUALITY: ADEQUATE

## 2018-07-13 LAB — URINALYSIS, ROUTINE W REFLEX MICROSCOPIC
Bilirubin Urine: NEGATIVE
Glucose, UA: NEGATIVE
Hgb urine dipstick: NEGATIVE
KETONES UR: NEGATIVE
Leukocytes, UA: NEGATIVE
NITRITE: NEGATIVE
PH: 6.5 (ref 5.0–8.0)
Protein, ur: NEGATIVE
SPECIFIC GRAVITY, URINE: 1.012 (ref 1.001–1.03)

## 2018-07-18 ENCOUNTER — Ambulatory Visit (INDEPENDENT_AMBULATORY_CARE_PROVIDER_SITE_OTHER): Payer: Medicare Other | Admitting: Family

## 2018-07-18 ENCOUNTER — Other Ambulatory Visit: Payer: Self-pay | Admitting: Internal Medicine

## 2018-07-18 ENCOUNTER — Encounter: Payer: Self-pay | Admitting: Family

## 2018-07-18 VITALS — BP 146/86 | HR 84 | Temp 98.5°F | Resp 16 | Ht 61.0 in | Wt 199.4 lb

## 2018-07-18 DIAGNOSIS — R791 Abnormal coagulation profile: Secondary | ICD-10-CM

## 2018-07-18 DIAGNOSIS — Z7901 Long term (current) use of anticoagulants: Secondary | ICD-10-CM | POA: Diagnosis not present

## 2018-07-18 LAB — POCT INR: INR: 3.1 — AB (ref 2.0–3.0)

## 2018-07-18 NOTE — Progress Notes (Signed)
Provider: Aundray Cartlidge FNP-C  Gayland Curry, DO  Patient Care Team: Gayland Curry, DO as PCP - General (Geriatric Medicine) Tanda Rockers, MD as Consulting Physician (Pulmonary Disease) Deveron Furlong, NP as Nurse Practitioner (Psychiatry)  Extended Emergency Contact Information Primary Emergency Contact: Deschler,Roberta(Bobbi) Address: [place], [place] [address] [name] of [place] Phone: [phone] Mobile Phone: [phone] Relation: Daughter Secondary Emergency Contact: Devin,David Address: [address]           [place], [place] [address] [place] Phone: [phone] Work Phone: [phone] Relation: Son   Goals of care: Advanced Directive information Advanced Directives 07/18/2018  Does Patient Have a Medical Advance Directive? Yes  Type of Advance Directive Out of facility DNR (pink MOST or yellow form)  Does patient want to make changes to medical advance directive? -  Copy of [place] in Chart? -  Would patient like information on creating a medical advance directive? -  Pre-existing out of facility DNR order (yellow form or pink MOST form) Yellow form placed in chart (order not valid for inpatient use)     Chief Complaint  Patient presents with  . INR CHECK    HPI:  Pt is a 83 y.o. female seen today at [facility] office for an acute visit for INR check.she has a significant medical history of Afib among other conditions.Her INR today was 3.1 goal 2-3 previous INR was 2.6 one week ago.No signs of bleeding or palpitation.she states had a dental evaluation yesterday for evaluation on her dentures due to a sore under her tongue.Care giver states sore seems to be getting better.she states eat yesterday without having to cry due to pain.Her temperature has been within normal limit except yesterday went upto 99.4 otherwise no fever,chills or signs of urinary tract infection.Her previous U/A and C/S  and CBC were all normal.       Past Medical History:  Diagnosis Date  . Anemia   . Anxiety   . Arthritis    hands, spine   . Blood transfusion   . Cervicalgia   . Constipation   . Cough    Wert-onset 08/2009, as of 2014- resolved   . Deep venous thrombosis ([place])    post op, rec'd/needed  blood thinner   . Depression   . Depression with anxiety   . Dysrhythmia   . Falls frequently    pt. reports that she was falling 3 times a day, has had PT at a facility for a while & now is getting home PT 2-3 times/ week   . Headache(784.0) 09/27/2011  . History of stress test    15-20 yrs.ago   . Hypotension   . Hypothyroidism   . Lumbar radiculopathy 09/26/2011  . Pneumonia    never been in hosp. for pneumonia   . Prediabetes   . Right hand fracture    Monday  . Shortness of breath   . Sleep apnea    doesn't use CPAP any longer, since weight loss   Past Surgical History:  Procedure Laterality Date  . ANTERIOR CERVICAL DECOMP/DISCECTOMY FUSION N/A 07/09/2013   Procedure: ANTERIOR CERVICAL DECOMPRESSION/DISCECTOMY FUSION 3 LEVELS;  Surgeon: Sinclair Ship, MD;  Location: [place];  Service: Orthopedics;  Laterality: N/A;  Anterior cervical decompression fusion, cervical 3-4, cervical 4-5-, cervical 5-6 with instrumentation, allograft.  . APPENDECTOMY    . BACK SURGERY     2000  .  BREAST EXCISIONAL BIOPSY     breast bxs on left x 2, breast bx of right x 1-all benign  . EYE SURGERY     cataracts bilateral /w IOL  . HERNIA REPAIR     umbilical hernia- 1761  . JOINT REPLACEMENT  2006,2007   bilateral  . LAPAROSCOPIC OVARIAN CYSTECTOMY     not done by laparscopy-abdominal incision  . REPLACEMENT TOTAL KNEE BILATERAL    . TONSILLECTOMY      Allergies  Allergen Reactions  . Clindamycin Hcl Other (See Comments)    REACTION: swelling, pt. Reports that she had a 16 lb. weightgain in one day    Outpatient Encounter Medications as of 07/18/2018  Medication Sig  . Brexpiprazole 1  MG TABS Take 1 tablet by mouth at bedtime.  . busPIRone (BUSPAR) 15 MG tablet Take 15 mg by mouth daily.  [name] CALCIUM CITRATE PO Take 600 mg by mouth 2 (two) times daily.  . clonazePAM (KLONOPIN) 0.5 MG tablet Take 1 tablet by mouth as needed in middle of the nite to go back to sleep  . CRANBERRY EXTRACT PO Take 2 tablets by mouth daily.  . DULoxetine (CYMBALTA) 60 MG capsule Take 2 capsules (120 mg total) by mouth daily.  . furosemide (LASIX) 40 MG tablet Take 0.5 tablets (20 mg total) by mouth daily.  [name] levothyroxine (SYNTHROID, LEVOTHROID) 50 MCG tablet TAKE ONE TABLET BY MOUTH ONCE DAILY 30 MINUTES BEFORE BREAKFAST FOR THYROID  . mineral oil-hydrophilic petrolatum (AQUAPHOR) ointment Apply topically 2 (two) times daily as needed for dry skin. Apply to legs for skin dryness  . mirabegron ER (MYRBETRIQ) 25 MG TB24 tablet Take 1 tablet (25 mg total) by mouth daily.  . mirtazapine (REMERON) 15 MG tablet Take 7.5 mg by mouth at bedtime.  . Multiple Vitamins-Minerals (MULTIVITAMIN PO) Take 1 tablet by mouth.  . oxyCODONE-acetaminophen (PERCOCET) 10-325 MG tablet Take 1 tab every 4 hrs as needed for moderate pain and 2 tabs every 4 hrs as needed for severe pain  . traZODone (DESYREL) 100 MG tablet Take 2 tablets by mouth at bedtime  . warfarin (COUMADIN) 5 MG tablet Take 5 mg by mouth daily.  . [DISCONTINUED] warfarin (COUMADIN) 1 MG tablet Take 1 tablet (1 mg total) by mouth daily.   No facility-administered encounter medications on file as of 07/18/2018.     Review of Systems  Constitutional: Negative for appetite change, chills, fatigue and fever.  HENT: Positive for hearing loss. Negative for congestion, nosebleeds, rhinorrhea, sinus pressure, sinus pain, sneezing and sore throat.   Respiratory: Negative for cough, chest tightness, shortness of breath and wheezing.   Cardiovascular: Negative for chest pain, palpitations and leg swelling.  Gastrointestinal: Negative for abdominal distention,  abdominal pain, blood in stool, constipation, diarrhea, nausea, rectal pain and vomiting.  Genitourinary: Negative for dysuria, flank pain, frequency and urgency.  Musculoskeletal: Positive for gait problem.  Skin: Negative for color change, pallor and rash.       Sore under the tongue has improved   Neurological: Negative for dizziness, light-headedness and headaches.  Hematological: Does not bruise/bleed easily.  Psychiatric/Behavioral: Negative for agitation, confusion and sleep disturbance. The patient is not nervous/anxious.     Immunization History  Administered Date(s) Administered  . Influenza Split 08/14/2013  . Influenza Whole 08/20/2009  . Influenza, High Dose Seasonal PF 09/11/2017  . Influenza,inj,Quad PF,6+ Mos 08/04/2014, 09/06/2015, 07/10/2016  . Pneumococcal Conjugate-13 01/14/2015  . Pneumococcal Polysaccharide-23 07/11/2013  . Td 11/20/2005, 11/27/2016  Pertinent  Health Maintenance Due  Topic Date Due  . OPHTHALMOLOGY EXAM  06/16/1945  . URINE MICROALBUMIN  11/27/2017  . INFLUENZA VACCINE  06/20/2018  . FOOT EXAM  10/18/2018  . HEMOGLOBIN A1C  12/04/2018  . DEXA SCAN  Completed  . PNA vac Low Risk Adult  Completed   Fall Risk  07/08/2018 06/20/2018 05/02/2018 04/04/2018 02/14/2018  Falls in the past year? No No No Yes No  Number falls in past yr: - - - 2 or more -  Comment - - - - -  Injury with Fall? - - - No -  Risk for fall due to : - - - - -  Follow up - - - - -    Vitals:   07/18/18 0946  BP: (!) 146/86  Pulse: 84  Resp: 16  Temp: 98.5 F (36.9 C)  TempSrc: Oral  SpO2: 92%  Weight: 199 lb 6.4 oz (90.4 kg)  Height: 5\' 1"  (1.549 m)   Body mass index is 37.68 kg/m. Physical Exam  Constitutional:  Elderly in no acute distress   HENT:  Head: Normocephalic.  Mouth/Throat: Oropharynx is clear and moist. No oropharyngeal exudate.  Bilateral Hearing aids in place   Eyes: Pupils are equal, round, and reactive to light. Conjunctivae are normal.  Right eye exhibits no discharge. Left eye exhibits no discharge.  Eye glasses in place   Neck: Normal range of motion. No JVD present. No thyromegaly present.  Cardiovascular: Normal rate, regular rhythm, normal heart sounds and intact distal pulses. Exam reveals no gallop and no friction rub.  No murmur heard. Pulmonary/Chest: Effort normal and breath sounds normal. No respiratory distress. She has no wheezes. She has no rales.  Abdominal: Soft. Bowel sounds are normal. She exhibits no distension and no mass. There is no tenderness. There is no rebound and no guarding.  Lymphadenopathy:    She has no cervical adenopathy.  Skin: Skin is warm and dry. No rash noted. No erythema. No pallor.  Mouth sore whitish in color without any redness or drainage and non-tender to touch   Psychiatric: She has a normal mood and affect. Her speech is normal and behavior is normal. Judgment and thought content normal.  Vitals reviewed.   Labs reviewed: Recent Labs    10/18/17 0949 06/03/18 0955  NA 138 137  K 4.2 3.9  CL 99 99  CO2 28 31  GLUCOSE 108* 100*  BUN 29* 30*  CREATININE 0.93* 1.08*  CALCIUM 9.3 9.4   Recent Labs    10/18/17 0949 06/03/18 0955  AST 15 15  ALT 8 7  BILITOT 0.4 0.5  PROT 6.6 6.5   Recent Labs    10/18/17 0949 06/03/18 0955 07/04/18 1652  WBC 6.9 4.7 6.3  NEUTROABS 4,816 2,966 3,679  HGB 12.7 13.5 13.1  HCT 38.3 39.9 38.7  MCV 85.7 89.7 89.2  PLT 237 219 254   Lab Results  Component Value Date   TSH 1.23 06/03/2018   Lab Results  Component Value Date   HGBA1C 5.2 06/03/2018   Lab Results  Component Value Date   CHOL 186 03/01/2017   HDL 50 (L) 03/01/2017   LDLCALC 108 (H) 03/01/2017   TRIG 139 03/01/2017   CHOLHDL 3.7 03/01/2017    Significant Diagnostic Results in last 30 days:  Dg Chest 2 View  Result Date: 07/08/2018 CLINICAL DATA:  Fever.  Recent aspiration EXAM: CHEST - 2 VIEW COMPARISON:  Chest radiograph 11/14/2017 FINDINGS: Anterior  cervical  spinal fusion hardware. Normal cardiac and mediastinal contours. No consolidative pulmonary opacities. No pleural effusion or pneumothorax. Thoracic spine degenerative changes. IMPRESSION: No acute cardiopulmonary process. Electronically Signed   By: Lovey Newcomer M.D.   On: 07/08/2018 17:24   Assessment/Plan 1. Long term (current) use of anticoagulants INR 3.1 today.Hold coumadin tonight then restart coumadin at 5 mg tablet daily on 07/19/2018.Next INR check 07/25/2018.Notify provider for any signs of bleeding.  - POCT INR  2. Supratherapeutic INR INR 3.1 today.Will hold coumadin as above then recheck INR 07/25/2018.    Family/ staff Communication: Reviewed plan of care with patient and facility care giver.   Labs/tests ordered: INR 07/25/2018    Sandrea Hughs, NP

## 2018-07-18 NOTE — Patient Instructions (Addendum)
1.Hold coumadin dose tonight 07/18/2018 2. Reduce coumadin to 5 mg tablet one by mouth daily starting on  07/19/2018

## 2018-07-25 ENCOUNTER — Encounter: Payer: Self-pay | Admitting: Family

## 2018-07-25 ENCOUNTER — Ambulatory Visit (INDEPENDENT_AMBULATORY_CARE_PROVIDER_SITE_OTHER): Payer: Medicare Other | Admitting: Family

## 2018-07-25 VITALS — BP 122/78 | HR 74 | Temp 98.9°F | Resp 10 | Ht 61.0 in | Wt 197.0 lb

## 2018-07-25 DIAGNOSIS — Z7901 Long term (current) use of anticoagulants: Secondary | ICD-10-CM | POA: Diagnosis not present

## 2018-07-25 DIAGNOSIS — Z23 Encounter for immunization: Secondary | ICD-10-CM | POA: Diagnosis not present

## 2018-07-25 DIAGNOSIS — I480 Paroxysmal atrial fibrillation: Secondary | ICD-10-CM | POA: Diagnosis not present

## 2018-07-25 LAB — POCT INR: INR: 2.1 (ref 2.0–3.0)

## 2018-07-25 NOTE — Progress Notes (Signed)
Provider: Malaya Cagley FNP-C  Gayland Curry, DO  Patient Care Team: Gayland Curry, DO as PCP - General (Geriatric Medicine) Tanda Rockers, MD as Consulting Physician (Pulmonary Disease) Deveron Furlong, NP as Nurse Practitioner (Psychiatry)  Extended Emergency Contact Information Primary Emergency Contact: Corson,Roberta(Bobbi) Address: [place], [place] [address] [name] Phone: [phone] Mobile Phone: [phone] Relation: Daughter Secondary Emergency Contact: Greenstein,David Address: [address]           [place], [place] [address] [place] Phone: [phone] Work Phone: [phone] Relation: Son   Goals of care: Advanced Directive information Advanced Directives 07/18/2018  Does Patient Have a Medical Advance Directive? Yes  Type of Advance Directive Out of facility DNR (pink MOST or yellow form)  Does patient want to make changes to medical advance directive? -  Copy of [place] in Chart? -  Would patient like information on creating a medical advance directive? -  Pre-existing out of facility DNR order (yellow form or pink MOST form) Yellow form placed in chart (order not valid for inpatient use)     Chief Complaint  Patient presents with  . Coagulation Disorder    1 week PT/INR Check, no missed doses other than 1 instructed, no bleeding and no diet changes     HPI:  Pt is a 83 y.o. female seen today at [facility] office for an acute visit for INR check.she is here today with her sitter.she denies any acute issues during visit.she has had no more fever since previous visit.she states thinks it was her gallbladder states had some pain on her right side." felt like a cramp" on the last visit though it's resolved.Her INR today was therapeutic at 2.1 ( previous INR was 3.1 on 07/18/2018).she held her coumadin 6 mg tablet as directed in previous visit then started coumadin 5 mg tablet  daily.No bleeding or palpitation reported.     Past Medical History:  Diagnosis Date  . Anemia   . Anxiety   . Arthritis    hands, spine   . Blood transfusion   . Cervicalgia   . Constipation   . Cough    Wert-onset 08/2009, as of 2014- resolved   . Deep venous thrombosis ([place])    post op, rec'd/needed  blood thinner   . Depression   . Depression with anxiety   . Dysrhythmia   . Falls frequently    pt. reports that she was falling 3 times a day, has had PT at a facility for a while & now is getting home PT 2-3 times/ week   . Headache(784.0) 09/27/2011  . History of stress test    15-20 yrs.ago   . Hypotension   . Hypothyroidism   . Lumbar radiculopathy 09/26/2011  . Pneumonia    never been in hosp. for pneumonia   . Prediabetes   . Right hand fracture    Monday  . Shortness of breath   . Sleep apnea    doesn't use CPAP any longer, since weight loss   Past Surgical History:  Procedure Laterality Date  . ANTERIOR CERVICAL DECOMP/DISCECTOMY FUSION N/A 07/09/2013   Procedure: ANTERIOR CERVICAL DECOMPRESSION/DISCECTOMY FUSION 3 LEVELS;  Surgeon: Sinclair Ship, MD;  Location: [facility];  Service: Orthopedics;  Laterality: N/A;  Anterior cervical decompression fusion, cervical 3-4, cervical 4-5-, cervical 5-6 with instrumentation, allograft.  . APPENDECTOMY    .  BACK SURGERY     2000  . BREAST EXCISIONAL BIOPSY     breast bxs on left x 2, breast bx of right x 1-all benign  . EYE SURGERY     cataracts bilateral /w IOL  . HERNIA REPAIR     umbilical hernia- [id number]  . JOINT REPLACEMENT  2006,2007   bilateral  . LAPAROSCOPIC OVARIAN CYSTECTOMY     not done by laparscopy-abdominal incision  . REPLACEMENT TOTAL KNEE BILATERAL    . TONSILLECTOMY      Allergies  Allergen Reactions  . Clindamycin Hcl Other (See Comments)    REACTION: swelling, pt. Reports that she had a 16 lb. weightgain in one day    Outpatient Encounter Medications as of 07/25/2018  Medication Sig  .  Brexpiprazole 1 MG TABS Take 1 tablet by mouth at bedtime.  . busPIRone (BUSPAR) 15 MG tablet Take 15 mg by mouth daily.  [name] CALCIUM CITRATE PO Take 600 mg by mouth 2 (two) times daily.  . clonazePAM (KLONOPIN) 0.5 MG tablet Take 1 tablet by mouth as needed in middle of the nite to go back to sleep  . CRANBERRY EXTRACT PO Take 2 tablets by mouth daily.  . DULoxetine (CYMBALTA) 60 MG capsule Take 2 capsules (120 mg total) by mouth daily.  . furosemide (LASIX) 40 MG tablet Take 0.5 tablets (20 mg total) by mouth daily.  [name] levothyroxine (SYNTHROID, LEVOTHROID) 50 MCG tablet TAKE ONE TABLET BY MOUTH ONCE DAILY 30 MINUTES BEFORE BREAKFAST FOR THYROID  . mineral oil-hydrophilic petrolatum (AQUAPHOR) ointment Apply topically 2 (two) times daily as needed for dry skin. Apply to legs for skin dryness  . mirabegron ER (MYRBETRIQ) 25 MG TB24 tablet Take 1 tablet (25 mg total) by mouth daily.  . mirtazapine (REMERON) 15 MG tablet Take 7.5 mg by mouth at bedtime.  . Multiple Vitamins-Minerals (MULTIVITAMIN PO) Take 1 tablet by mouth.  . oxyCODONE-acetaminophen (PERCOCET) 10-325 MG tablet Take 1 tab every 4 hrs as needed for moderate pain and 2 tabs every 4 hrs as needed for severe pain  . traZODone (DESYREL) 100 MG tablet Take 2 tablets by mouth at bedtime  . warfarin (COUMADIN) 5 MG tablet Take 5 mg by mouth daily.   No facility-administered encounter medications on file as of 07/25/2018.     Review of Systems  Constitutional: Negative for appetite change, chills, fatigue and fever.  HENT: Positive for hearing loss. Negative for congestion, ear pain, rhinorrhea, sinus pressure, sinus pain, sneezing and sore throat.        Wears hearing aids.has upper and lower dentures   Eyes: Positive for visual disturbance. Negative for pain, discharge, redness and itching.       Wears eye glasses   Respiratory: Negative for cough, chest tightness, shortness of breath and wheezing.   Cardiovascular: Negative for chest  pain, palpitations and leg swelling.  Gastrointestinal: Negative for abdominal distention, abdominal pain, constipation, diarrhea, nausea and vomiting.  Skin: Negative for color change, pallor, rash and wound.  Neurological: Negative for dizziness, light-headedness and headaches.  Psychiatric/Behavioral: Negative for agitation, confusion and sleep disturbance. The patient is not nervous/anxious.     Immunization History  Administered Date(s) Administered  . Influenza Split 08/14/2013  . Influenza Whole 08/20/2009  . Influenza, High Dose Seasonal PF 09/11/2017  . Influenza,inj,Quad PF,6+ Mos 08/04/2014, 09/06/2015, 07/10/2016  . Pneumococcal Conjugate-13 01/14/2015  . Pneumococcal Polysaccharide-23 07/11/2013  . Td 11/20/2005, 11/27/2016   Pertinent  Health Maintenance Due  Topic Date Due  .  OPHTHALMOLOGY EXAM  06/16/1945  . URINE MICROALBUMIN  11/27/2017  . INFLUENZA VACCINE  06/20/2018  . FOOT EXAM  10/18/2018  . HEMOGLOBIN A1C  12/04/2018  . DEXA SCAN  Completed  . PNA vac Low Risk Adult  Completed   Fall Risk  07/08/2018 06/20/2018 05/02/2018 04/04/2018 02/14/2018  Falls in the past year? No No No Yes No  Number falls in past yr: - - - 2 or more -  Comment - - - - -  Injury with Fall? - - - No -  Risk for fall due to : - - - - -  Follow up - - - - -   Functional Status Survey:    Vitals:   07/25/18 1501  BP: 122/78  Pulse: 74  Resp: 10  Temp: 98.9 F (37.2 C)  TempSrc: Oral  SpO2: 97%  Weight: 197 lb (89.4 kg)  Height: 5\' 1"  (1.549 m)   Body mass index is 37.22 kg/m. Physical Exam  Constitutional: She is oriented to person, place, and time. She appears well-developed.  Obese elderly in no acute distress   HENT:  Head: Normocephalic.  Mouth/Throat: Oropharynx is clear and moist. No oropharyngeal exudate.  Eyes: Pupils are equal, round, and reactive to light. Conjunctivae are normal. Right eye exhibits no discharge. Left eye exhibits no discharge. No scleral  icterus.  Neck: Normal range of motion.  Cardiovascular: Normal rate, regular rhythm, normal heart sounds and intact distal pulses. Exam reveals no gallop and no friction rub.  No murmur heard. Pulmonary/Chest: Effort normal and breath sounds normal. No respiratory distress. She has no wheezes. She has no rales.  Abdominal: Soft. Bowel sounds are normal. She exhibits no distension and no mass. There is no tenderness. There is no rebound and no guarding.  Musculoskeletal: Normal range of motion. She exhibits no edema or tenderness.  Lymphadenopathy:    She has no cervical adenopathy.  Neurological: She is oriented to person, place, and time.  Skin: Skin is warm and dry. No rash noted. No erythema. No pallor.  Psychiatric: She has a normal mood and affect. Her behavior is normal. Judgment and thought content normal.  Vitals reviewed.   Labs reviewed: Recent Labs    10/18/17 0949 06/03/18 0955  NA 138 137  K 4.2 3.9  CL 99 99  CO2 28 31  GLUCOSE 108* 100*  BUN 29* 30*  CREATININE 0.93* 1.08*  CALCIUM 9.3 9.4   Recent Labs    10/18/17 0949 06/03/18 0955  AST 15 15  ALT 8 7  BILITOT 0.4 0.5  PROT 6.6 6.5   Recent Labs    10/18/17 0949 06/03/18 0955 07/04/18 1652  WBC 6.9 4.7 6.3  NEUTROABS 4,816 2,966 3,679  HGB 12.7 13.5 13.1  HCT 38.3 39.9 38.7  MCV 85.7 89.7 89.2  PLT 237 219 254   Lab Results  Component Value Date   TSH 1.23 06/03/2018   Lab Results  Component Value Date   HGBA1C 5.2 06/03/2018   Lab Results  Component Value Date   CHOL 186 03/01/2017   HDL 50 (L) 03/01/2017   LDLCALC 108 (H) 03/01/2017   TRIG 139 03/01/2017   CHOLHDL 3.7 03/01/2017    Significant Diagnostic Results in last 30 days:  Dg Chest 2 View  Result Date: 07/08/2018 CLINICAL DATA:  Fever.  Recent aspiration EXAM: CHEST - 2 VIEW COMPARISON:  Chest radiograph 11/14/2017 FINDINGS: Anterior cervical spinal fusion hardware. Normal cardiac and mediastinal contours. No  consolidative pulmonary  opacities. No pleural effusion or pneumothorax. Thoracic spine degenerative changes. IMPRESSION: No acute cardiopulmonary process. Electronically Signed   By: Lovey Newcomer M.D.   On: 07/08/2018 17:24    Assessment/Plan 1. Long term (current) use of anticoagulants INR today 2.1 will continue on coumadin 5 mg tablet daily.No signs of bleeding noted.Follow up in 2 weeks for INR check. - POC INR  2. Paroxysmal A-fib (HCC) HR controlled.No palpitation reported.continue on coumadin 5 mg tablet daily.recheck INR in 2 weeks.   3. Need for Flu vaccine   Afebrile. Administer high dose flu vaccine today.continue to monitor for any signs of reactions.  Family/ staff Communication: Reviewed plan of care with patient and care giver.  Labs/tests ordered:POC  INR   Sandrea Hughs, NP

## 2018-07-25 NOTE — Patient Instructions (Signed)
1. Notify provider if running any fever greater than 100.5  2. Continue on coumadin 5 mg tablet daily then follow up in 2 weeks to recheck INR   Preventing Influenza, Adult Influenza, more commonly known as "the flu," is a viral infection that mainly affects the respiratory tract. The respiratory tract includes structures that help you breathe, such as the lungs, nose, and throat. The flu causes many common cold symptoms, as well as a high fever and body aches. The flu spreads easily from person to person (is contagious). The flu is most common from December through March. This is called flu season.You can catch the flu virus by:  Breathing in droplets from an infected person's cough or sneeze.  Touching something that was recently contaminated with the virus and then touching your mouth, nose, or eyes.  What can I do to lower my risk? You can decrease your risk of getting the flu by:  Getting a flu shot (influenza vaccination) every year. This is the best way to prevent the flu. A flu shot is recommended for everyone age 24 months and older. ? It is best to get a flu shot in the fall, as soon as it is available. Getting a flu shot during winter or spring instead is still a good idea. Flu season can last into early spring. ? Preventing the flu through vaccination requires getting a new flu shot every year. This is because the flu virus changes slightly (mutates) from one year to the next. Even if a flu shot does not completely protect you from all flu virus mutations, it can reduce the severity of your illness and prevent dangerous complications of the flu. ? If you are pregnant, you can and should get a flu shot. ? If you have had a reaction to the shot in the past or if you are allergic to eggs, check with your health care provider before getting a flu shot. ? Sometimes the vaccine is available as a nasal spray. In some years, the nasal spray has not been as effective against the flu virus.  Check with your health care provider if you have questions about this.  Practicing good health habits. This is especially important during flu season. ? Avoid contact with people who are sick with flu or cold symptoms. ? Wash your hands with soap and water often. If soap and water are not available, use hand sanitizer. ? Avoid touching your hands to your face, especially when you have not washed your hands recently. ? Use a disinfectant to clean surfaces at home and at work that may be contaminated with the flu virus. ? Keep your body's disease-fighting system (immune system) in good shape by eating a healthy diet, drinking plenty of fluids, getting enough sleep, and exercising regularly.  If you do get the flu, avoid spreading it to others by:  Staying home until your symptoms have been gone for at least one day.  Covering your mouth and nose with your elbow when you cough or sneeze.  Avoiding close contact with others, especially babies and elderly people.  Why are these changes important? Getting a flu shot and practicing good health habits protects you as well as other people. If you get the flu, your friends, family, and co-workers are also at risk of getting it, because it spreads so easily to others. Each year, about 2 out of every 10 people get the flu. Having the flu can lead to complications, such as pneumonia, ear infection, and  sinus infection. The flu also can be deadly, especially for babies, people older than age 33, and people who have serious long-term diseases. How is this treated? Most people recover from the flu by resting at home and drinking plenty of fluids. However, a prescription antiviral medicine may reduce your flu symptoms and may make your flu go away sooner. This medicine must be started within a few days of getting flu symptoms. You can talk with your health care provider about whether you need an antiviral medicine. Antiviral medicine may be prescribed for  people who are at risk for more serious flu symptoms. This includes people who:  Are older than age 29.  Are pregnant.  Have a condition that makes the flu worse or more dangerous.  Where to find more information:  Centers for Disease Control and Prevention: http://www.smith-bell.org/  [employer].[employer].ee: azureicus.com  American Academy of Family Physicians: familydoctor.org/familydoctor/en/kids/vaccines/preventing-the-flu.html Contact a health care provider if:  You have influenza and you develop new symptoms.  You have: ? Chest pain. ? Diarrhea. ? A fever.  Your cough gets worse, or you produce more mucus. Summary  The best way to prevent the flu is to get a flu shot every year in the fall.  Even if you get the flu after you have received the yearly vaccine, your flu may be milder and go away sooner because of your flu shot.  If you get the flu, antiviral medicines that are started with a few days of symptoms may reduce your flu symptoms and may make your flu go away sooner.  You can also help prevent the flu by practicing good health habits. This information is not intended to replace advice given to you by your health care provider. Make sure you discuss any questions you have with your health care provider. Document Released: 11/21/2015 Document Revised: 07/15/2016 Document Reviewed: 07/15/2016 Elsevier Interactive Patient Education  [employer].

## 2018-08-05 DIAGNOSIS — F331 Major depressive disorder, recurrent, moderate: Secondary | ICD-10-CM | POA: Diagnosis not present

## 2018-08-05 DIAGNOSIS — F341 Dysthymic disorder: Secondary | ICD-10-CM | POA: Diagnosis not present

## 2018-08-05 DIAGNOSIS — F411 Generalized anxiety disorder: Secondary | ICD-10-CM | POA: Diagnosis not present

## 2018-08-08 ENCOUNTER — Ambulatory Visit (INDEPENDENT_AMBULATORY_CARE_PROVIDER_SITE_OTHER): Payer: Medicare Other | Admitting: Family

## 2018-08-08 VITALS — BP 118/82 | HR 60 | Temp 98.8°F | Resp 10 | Ht 61.0 in | Wt 197.0 lb

## 2018-08-08 DIAGNOSIS — I480 Paroxysmal atrial fibrillation: Secondary | ICD-10-CM | POA: Diagnosis not present

## 2018-08-08 DIAGNOSIS — Z7901 Long term (current) use of anticoagulants: Secondary | ICD-10-CM

## 2018-08-08 LAB — POCT INR: INR: 1.8 — AB (ref 2.0–3.0)

## 2018-08-08 NOTE — Progress Notes (Signed)
Provider: Adilson Grafton FNP-C  Gayland Curry, DO  Patient Care Team: Gayland Curry, DO as PCP - General (Geriatric Medicine) Tanda Rockers, MD as Consulting Physician (Pulmonary Disease) Deveron Furlong, NP as Nurse Practitioner (Psychiatry)  Extended Emergency Contact Information Primary Emergency Contact: Bartosik,Roberta(Bobbi) Address: [place], [place] [address] [name] Phone: [phone] Mobile Phone: [phone] Relation: Daughter Secondary Emergency Contact: Whitelock,David Address: [address]           [place], [place] [address] [place] Phone: [phone] Work Phone: [phone] Relation: Son   Goals of care: Advanced Directive information Advanced Directives 07/18/2018  Does Patient Have a Medical Advance Directive? Yes  Type of Advance Directive Out of facility DNR (pink MOST or yellow form)  Does patient want to make changes to medical advance directive? -  Copy of [place] in Chart? -  Would patient like information on creating a medical advance directive? -  Pre-existing out of facility DNR order (yellow form or pink MOST form) Yellow form placed in chart (order not valid for inpatient use)     Chief Complaint  Patient presents with  . Coagulation Disorder    PT/INR Check, no missed doses, no diet changes, and no bleeding   . Medication Management    Took an OTC supplement/beta blocker last night, FYI. Patient's caregive not sure if this caused PT/INR to be off    . Fever    Patient with fever off/on x several weeks     HPI:  Pt is a 83 y.o. female seen today at [facility] office for an acute visit for evaluation of INR  Previous INR was therapeutic 2.1 (07/25/2018)INR today was 1.8 gaol 2-3 for Afib.Care giver states took new supplement Acetely L-Carnitine yesterday not sure if it affected INR.she denies any bleeding or palpitation.No changes in her diet. Acetely L-Carnitine  drug information reviewed no contraindication or interaction with anticoagulant.  Past Medical History:  Diagnosis Date  . Anemia   . Anxiety   . Arthritis    hands, spine   . Blood transfusion   . Cervicalgia   . Constipation   . Cough    Wert-onset 08/2009, as of 2014- resolved   . Deep venous thrombosis ([place])    post op, rec'd/needed  blood thinner   . Depression   . Depression with anxiety   . Dysrhythmia   . Falls frequently    pt. reports that she was falling 3 times a day, has had PT at a facility for a while & now is getting home PT 2-3 times/ week   . Headache(784.0) 09/27/2011  . History of stress test    15-20 yrs.ago   . Hypotension   . Hypothyroidism   . Lumbar radiculopathy 09/26/2011  . Pneumonia    never been in hosp. for pneumonia   . Prediabetes   . Right hand fracture    Monday  . Shortness of breath   . Sleep apnea    doesn't use CPAP any longer, since weight loss   Past Surgical History:  Procedure Laterality Date  . ANTERIOR CERVICAL DECOMP/DISCECTOMY FUSION N/A 07/09/2013   Procedure: ANTERIOR CERVICAL DECOMPRESSION/DISCECTOMY FUSION 3 LEVELS;  Surgeon: Sinclair Ship, MD;  Location: [place];  Service: Orthopedics;  Laterality: N/A;  Anterior cervical decompression fusion, cervical 3-4, cervical 4-5-, cervical 5-6 with instrumentation, allograft.  . APPENDECTOMY    .  BACK SURGERY     2000  . BREAST EXCISIONAL BIOPSY     breast bxs on left x 2, breast bx of right x 1-all benign  . EYE SURGERY     cataracts bilateral /w IOL  . HERNIA REPAIR     umbilical hernia- [id number]  . JOINT REPLACEMENT  2006,2007   bilateral  . LAPAROSCOPIC OVARIAN CYSTECTOMY     not done by laparscopy-abdominal incision  . REPLACEMENT TOTAL KNEE BILATERAL    . TONSILLECTOMY      Allergies  Allergen Reactions  . Clindamycin Hcl Other (See Comments)    REACTION: swelling, pt. Reports that she had a 16 lb. weightgain in one day    Outpatient Encounter Medications as  of 08/08/2018  Medication Sig  . Brexpiprazole 1 MG TABS Take 1 tablet by mouth at bedtime.  . busPIRone (BUSPAR) 15 MG tablet Take 15 mg by mouth daily.  [name] CALCIUM CITRATE PO Take 600 mg by mouth 2 (two) times daily.  . clonazePAM (KLONOPIN) 0.5 MG tablet Take 1 tablet by mouth as needed in middle of the nite to go back to sleep  . CRANBERRY EXTRACT PO Take 2 tablets by mouth daily.  . DULoxetine (CYMBALTA) 60 MG capsule Take 2 capsules (120 mg total) by mouth daily.  . furosemide (LASIX) 40 MG tablet Take 0.5 tablets (20 mg total) by mouth daily.  [name] levothyroxine (SYNTHROID, LEVOTHROID) 50 MCG tablet TAKE ONE TABLET BY MOUTH ONCE DAILY 30 MINUTES BEFORE BREAKFAST FOR THYROID  . mirabegron ER (MYRBETRIQ) 25 MG TB24 tablet Take 1 tablet (25 mg total) by mouth daily.  . mirtazapine (REMERON) 15 MG tablet Take 7.5 mg by mouth at bedtime.  . Multiple Vitamins-Minerals (MULTIVITAMIN PO) Take 1 tablet by mouth.  . oxyCODONE-acetaminophen (PERCOCET) 10-325 MG tablet Take 1 tab every 4 hrs as needed for moderate pain and 2 tabs every 4 hrs as needed for severe pain  . traZODone (DESYREL) 100 MG tablet Take 2 tablets by mouth at bedtime  . warfarin (COUMADIN) 5 MG tablet Take 5 mg by mouth daily.  . [DISCONTINUED] mineral oil-hydrophilic petrolatum (AQUAPHOR) ointment Apply topically 2 (two) times daily as needed for dry skin. Apply to legs for skin dryness (Patient not taking: Reported on 08/08/2018)   No facility-administered encounter medications on file as of 08/08/2018.     Review of Systems  Constitutional: Negative for appetite change, chills, fatigue and fever.  HENT: Positive for congestion and hearing loss. Negative for nosebleeds, rhinorrhea, sinus pressure, sinus pain, sneezing and sore throat.   Eyes: Positive for visual disturbance. Negative for discharge and redness.       Wears eye glasses   Respiratory: Negative for cough, chest tightness, shortness of breath and wheezing.     Cardiovascular: Negative for chest pain, palpitations and leg swelling.  Gastrointestinal: Negative for blood in stool, constipation and rectal pain.  Genitourinary: Negative for dysuria, hematuria and vaginal bleeding.  Skin: Negative for color change, pallor and rash.  Neurological: Negative for dizziness, light-headedness and headaches.  Hematological: Bruises/bleeds easily.    Immunization History  Administered Date(s) Administered  . Influenza Split 08/14/2013  . Influenza Whole 08/20/2009  . Influenza, High Dose Seasonal PF 09/11/2017, 07/25/2018  . Influenza,inj,Quad PF,6+ Mos 08/04/2014, 09/06/2015, 07/10/2016  . Pneumococcal Conjugate-13 01/14/2015  . Pneumococcal Polysaccharide-23 07/11/2013  . Td 11/20/2005, 11/27/2016   Pertinent  Health Maintenance Due  Topic Date Due  . URINE MICROALBUMIN  11/27/2017  . FOOT EXAM  10/18/2018  .  HEMOGLOBIN A1C  12/04/2018  . OPHTHALMOLOGY EXAM  01/02/2019  . INFLUENZA VACCINE  Completed  . DEXA SCAN  Completed  . PNA vac Low Risk Adult  Completed   Fall Risk  07/08/2018 06/20/2018 05/02/2018 04/04/2018 02/14/2018  Falls in the past year? No No No Yes No  Number falls in past yr: - - - 2 or more -  Comment - - - - -  Injury with Fall? - - - No -  Risk for fall due to : - - - - -  Follow up - - - - -    Vitals:   08/08/18 1005  BP: 118/82  Pulse: 60  Resp: 10  Temp: 98.8 F (37.1 C)  TempSrc: Oral  Weight: 197 lb (89.4 kg)  Height: 5\' 1"  (1.549 m)   Body mass index is 37.22 kg/m. Physical Exam  Constitutional: She is oriented to person, place, and time. She appears well-developed and well-nourished. No distress.  HENT:  Head: Normocephalic.  Mouth/Throat: Oropharynx is clear and moist. No oropharyngeal exudate.  Hearing aids in place   Eyes: Pupils are equal, round, and reactive to light. Conjunctivae are normal. Right eye exhibits no discharge. Left eye exhibits no discharge. No scleral icterus.  Neck: Normal range of  motion. No JVD present.  Cardiovascular: Normal rate, regular rhythm, normal heart sounds and intact distal pulses. Exam reveals no gallop and no friction rub.  No murmur heard. Pulmonary/Chest: Effort normal and breath sounds normal. No respiratory distress. She has no wheezes. She has no rales.  Abdominal: Soft. Bowel sounds are normal. She exhibits no distension and no mass. There is no tenderness. There is no rebound and no guarding.  Lymphadenopathy:    She has no cervical adenopathy.  Neurological: She is oriented to person, place, and time.  Skin: Skin is warm and dry. No rash noted. No erythema. No pallor.  Psychiatric: She has a normal mood and affect. Her behavior is normal. Judgment and thought content normal.  Vitals reviewed.   Labs reviewed: Recent Labs    10/18/17 0949 06/03/18 0955  NA 138 137  K 4.2 3.9  CL 99 99  CO2 28 31  GLUCOSE 108* 100*  BUN 29* 30*  CREATININE 0.93* 1.08*  CALCIUM 9.3 9.4   Recent Labs    10/18/17 0949 06/03/18 0955  AST 15 15  ALT 8 7  BILITOT 0.4 0.5  PROT 6.6 6.5   Recent Labs    10/18/17 0949 06/03/18 0955 07/04/18 1652  WBC 6.9 4.7 6.3  NEUTROABS 4,816 2,966 3,679  HGB 12.7 13.5 13.1  HCT 38.3 39.9 38.7  MCV 85.7 89.7 89.2  PLT 237 219 254   Lab Results  Component Value Date   TSH 1.23 06/03/2018   Lab Results  Component Value Date   HGBA1C 5.2 06/03/2018   Lab Results  Component Value Date   CHOL 186 03/01/2017   HDL 50 (L) 03/01/2017   LDLCALC 108 (H) 03/01/2017   TRIG 139 03/01/2017   CHOLHDL 3.7 03/01/2017    Significant Diagnostic Results in last 30 days:  No results found.  Assessment/Plan 1. Long term (current) use of anticoagulants INR 1.8 today with goal of 2-3 No signs of bleeding.will continue on coumadin 5 mg tablet daily.follow up in one week for INR check. - POC INR  2. Paroxysmal A-fib (HCC) No chest pain or palpitation.continue on coumadin 5 mg tablet daily.Monitor for signs of  bleeding.INR check in one week as above.  -  POC INR  Family/ staff Communication: Reviewed plan of care with patient and care giver.  Labs/tests ordered: POC INR   Sandrea Hughs, NP

## 2018-08-12 ENCOUNTER — Other Ambulatory Visit: Payer: Self-pay | Admitting: Internal Medicine

## 2018-08-15 ENCOUNTER — Encounter: Payer: Self-pay | Admitting: Family

## 2018-08-15 ENCOUNTER — Ambulatory Visit (INDEPENDENT_AMBULATORY_CARE_PROVIDER_SITE_OTHER): Payer: Medicare Other | Admitting: Family

## 2018-08-15 VITALS — BP 122/66 | HR 72 | Temp 99.2°F | Ht 61.0 in | Wt 198.0 lb

## 2018-08-15 DIAGNOSIS — Z7901 Long term (current) use of anticoagulants: Secondary | ICD-10-CM | POA: Diagnosis not present

## 2018-08-15 DIAGNOSIS — I480 Paroxysmal atrial fibrillation: Secondary | ICD-10-CM | POA: Diagnosis not present

## 2018-08-15 LAB — POCT INR: INR: 1.8 — AB (ref 2.0–3.0)

## 2018-08-15 NOTE — Patient Instructions (Signed)
1. Continue on coumadin 5 mg tablet daily.Notify provider for any signs of bleeding.  2. Follow up in 2 weeks for INR check

## 2018-08-15 NOTE — Progress Notes (Signed)
Provider: Dinah Ngetich FNP-C  Gayland Curry, DO  Patient Care Team: Gayland Curry, DO as PCP - General (Geriatric Medicine) Tanda Rockers, MD as Consulting Physician (Pulmonary Disease) Deveron Furlong, NP as Nurse Practitioner (Psychiatry)  Extended Emergency Contact Information Primary Emergency Contact: Stange,Roberta(Bobbi) Address: [place], [place] [address] [name] of [place] Phone: [phone] Mobile Phone: [phone] Relation: Daughter Secondary Emergency Contact: Deeney,David Address: [address]           [place], [place] [address] [place] Phone: [phone] Work Phone: [phone] Relation: Son   Goals of care: Advanced Directive information Advanced Directives 07/18/2018  Does Patient Have a Medical Advance Directive? Yes  Type of Advance Directive Out of facility DNR (pink MOST or yellow form)  Does patient want to make changes to medical advance directive? -  Copy of [place] in Chart? -  Would patient like information on creating a medical advance directive? -  Pre-existing out of facility DNR order (yellow form or pink MOST form) Yellow form placed in chart (order not valid for inpatient use)     Chief Complaint  Patient presents with  . Anticoagulation    PT INR check    HPI:  Pt is a 83 y.o. female seen today at [facility] office for an acute visit for evaluation of INR.she states no changes in her diet,medication and no signs of bleeding.Her INR today is 1.8 with goal of 2-3 for AFib.Her previous INR was 1.8 (08/08/2018).she is currently on coumadin 5 mg tablet daily.she has not skipped any dosage.Her Temp 99.2  Though denies any acute illness.she has had higher temp readings in the past without any symptoms.Her CBC and urine specimen for U/A and C/S were also negative.she states feeling good today.she has had no tylenol.    Past Medical History:  Diagnosis Date  . Anemia    . Anxiety   . Arthritis    hands, spine   . Blood transfusion   . Cervicalgia   . Constipation   . Cough    Wert-onset 08/2009, as of 2014- resolved   . Deep venous thrombosis ([place])    post op, rec'd/needed  blood thinner   . Depression   . Depression with anxiety   . Dysrhythmia   . Falls frequently    pt. reports that she was falling 3 times a day, has had PT at a facility for a while & now is getting home PT 2-3 times/ week   . Headache(784.0) 09/27/2011  . History of stress test    15-20 yrs.ago   . Hypotension   . Hypothyroidism   . Lumbar radiculopathy 09/26/2011  . Pneumonia    never been in hosp. for pneumonia   . Prediabetes   . Right hand fracture    Monday  . Shortness of breath   . Sleep apnea    doesn't use CPAP any longer, since weight loss   Past Surgical History:  Procedure Laterality Date  . ANTERIOR CERVICAL DECOMP/DISCECTOMY FUSION N/A 07/09/2013   Procedure: ANTERIOR CERVICAL DECOMPRESSION/DISCECTOMY FUSION 3 LEVELS;  Surgeon: Sinclair Ship, MD;  Location: [place];  Service: Orthopedics;  Laterality: N/A;  Anterior cervical decompression fusion, cervical 3-4, cervical 4-5-, cervical 5-6 with instrumentation, allograft.  . APPENDECTOMY    . BACK SURGERY     2000  . BREAST EXCISIONAL BIOPSY     breast bxs on  left x 2, breast bx of right x 1-all benign  . EYE SURGERY     cataracts bilateral /w IOL  . HERNIA REPAIR     umbilical hernia- 3532  . JOINT REPLACEMENT  2006,2007   bilateral  . LAPAROSCOPIC OVARIAN CYSTECTOMY     not done by laparscopy-abdominal incision  . REPLACEMENT TOTAL KNEE BILATERAL    . TONSILLECTOMY      Allergies  Allergen Reactions  . Clindamycin Hcl Other (See Comments)    REACTION: swelling, pt. Reports that she had a 16 lb. weightgain in one day    Outpatient Encounter Medications as of 08/15/2018  Medication Sig  . Brexpiprazole 1 MG TABS Take 1 tablet by mouth at bedtime.  . busPIRone (BUSPAR) 15 MG tablet Take  15 mg by mouth daily.  [name] CALCIUM CITRATE PO Take 600 mg by mouth 2 (two) times daily.  . clonazePAM (KLONOPIN) 0.5 MG tablet Take 1 tablet by mouth as needed in middle of the nite to go back to sleep  . CRANBERRY EXTRACT PO Take 2 tablets by mouth daily.  . DULoxetine (CYMBALTA) 60 MG capsule Take 2 capsules (120 mg total) by mouth daily.  . furosemide (LASIX) 40 MG tablet Take 0.5 tablets (20 mg total) by mouth daily.  [name] levothyroxine (SYNTHROID, LEVOTHROID) 50 MCG tablet TAKE ONE TABLET BY MOUTH ONCE DAILY 30 MINUTES BEFORE BREAKFAST FOR THYROID  . mirabegron ER (MYRBETRIQ) 25 MG TB24 tablet Take 1 tablet (25 mg total) by mouth daily.  . mirtazapine (REMERON) 15 MG tablet Take 7.5 mg by mouth at bedtime.  . Multiple Vitamins-Minerals (MULTIVITAMIN PO) Take 1 tablet by mouth.  . oxyCODONE-acetaminophen (PERCOCET) 10-325 MG tablet Take 1 tab every 4 hrs as needed for moderate pain and 2 tabs every 4 hrs as needed for severe pain  . traZODone (DESYREL) 100 MG tablet Take 2 tablets by mouth at bedtime  . meloxicam (MOBIC) 15 MG tablet TAKE 1 TABLET BY MOUTH EVERY DAY (Patient not taking: Reported on 08/15/2018)  . warfarin (COUMADIN) 5 MG tablet Take 5 mg by mouth daily.   No facility-administered encounter medications on file as of 08/15/2018.     Review of Systems  Constitutional: Negative for appetite change, chills, fatigue and fever.  HENT: Positive for hearing loss. Negative for congestion, nosebleeds, rhinorrhea, sinus pressure, sinus pain, sneezing and sore throat.   Eyes: Positive for visual disturbance. Negative for discharge and redness.       Wears eye glasses   Respiratory: Negative for cough, chest tightness, shortness of breath and wheezing.   Cardiovascular: Negative for chest pain, palpitations and leg swelling.  Gastrointestinal: Negative for abdominal distention, abdominal pain, blood in stool, constipation, diarrhea, nausea and vomiting.  Genitourinary: Negative for flank  pain, hematuria and urgency.  Skin: Negative for color change, pallor, rash and wound.  Neurological: Negative for dizziness, light-headedness and headaches.  Hematological: Bruises/bleeds easily.  Psychiatric/Behavioral: Negative for agitation and sleep disturbance. The patient is not nervous/anxious.     Immunization History  Administered Date(s) Administered  . Influenza Split 08/14/2013  . Influenza Whole 08/20/2009  . Influenza, High Dose Seasonal PF 09/11/2017, 07/25/2018  . Influenza,inj,Quad PF,6+ Mos 08/04/2014, 09/06/2015, 07/10/2016  . Pneumococcal Conjugate-13 01/14/2015  . Pneumococcal Polysaccharide-23 07/11/2013  . Td 11/20/2005, 11/27/2016   Pertinent  Health Maintenance Due  Topic Date Due  . URINE MICROALBUMIN  11/27/2017  . FOOT EXAM  10/18/2018  . HEMOGLOBIN A1C  12/04/2018  . OPHTHALMOLOGY EXAM  01/02/2019  .  INFLUENZA VACCINE  Completed  . DEXA SCAN  Completed  . PNA vac Low Risk Adult  Completed   Fall Risk  07/08/2018 06/20/2018 05/02/2018 04/04/2018 02/14/2018  Falls in the past year? No No No Yes No  Number falls in past yr: - - - 2 or more -  Comment - - - - -  Injury with Fall? - - - No -  Risk for fall due to : - - - - -  Follow up - - - - -    Vitals:   08/15/18 1008  BP: 122/66  Pulse: 72  Temp: 99.2 F (37.3 C)  SpO2: 99%  Weight: 198 lb (89.8 kg)  Height: 5\' 1"  (1.549 m)   Body mass index is 37.41 kg/m. Physical Exam  Constitutional: She is oriented to person, place, and time. She appears well-developed and well-nourished. No distress.  HENT:  Head: Normocephalic.  Right Ear: External ear normal.  Left Ear: External ear normal.  Mouth/Throat: Oropharynx is clear and moist. No oropharyngeal exudate.  Hearing aids in place   Eyes: Pupils are equal, round, and reactive to light. Conjunctivae and EOM are normal. Right eye exhibits no discharge. Left eye exhibits no discharge. No scleral icterus.  Neck: Normal range of motion. No JVD  present.  Cardiovascular: Normal rate, regular rhythm, normal heart sounds and intact distal pulses. Exam reveals no gallop and no friction rub.  No murmur heard. Pulmonary/Chest: Effort normal and breath sounds normal. No respiratory distress. She has no wheezes. She has no rales.  Abdominal: Soft. Bowel sounds are normal. She exhibits no distension and no mass. There is no tenderness. There is no rebound and no guarding.  Lymphadenopathy:    She has no cervical adenopathy.  Neurological: She is oriented to person, place, and time.  Skin: Skin is warm and dry. No rash noted. No erythema. No pallor.  Psychiatric: She has a normal mood and affect. Her behavior is normal. Thought content normal.   Labs reviewed: Recent Labs    10/18/17 0949 06/03/18 0955  NA 138 137  K 4.2 3.9  CL 99 99  CO2 28 31  GLUCOSE 108* 100*  BUN 29* 30*  CREATININE 0.93* 1.08*  CALCIUM 9.3 9.4   Recent Labs    10/18/17 0949 06/03/18 0955  AST 15 15  ALT 8 7  BILITOT 0.4 0.5  PROT 6.6 6.5   Recent Labs    10/18/17 0949 06/03/18 0955 07/04/18 1652  WBC 6.9 4.7 6.3  NEUTROABS 4,816 2,966 3,679  HGB 12.7 13.5 13.1  HCT 38.3 39.9 38.7  MCV 85.7 89.7 89.2  PLT 237 219 254   Lab Results  Component Value Date   TSH 1.23 06/03/2018   Lab Results  Component Value Date   HGBA1C 5.2 06/03/2018   Lab Results  Component Value Date   CHOL 186 03/01/2017   HDL 50 (L) 03/01/2017   LDLCALC 108 (H) 03/01/2017   TRIG 139 03/01/2017   CHOLHDL 3.7 03/01/2017    Significant Diagnostic Results in last 30 days:  No results found.  Assessment/Plan 1. Long term (current) use of anticoagulants INR 1.8 today previous INR was 1.8 with goal 2-3 Continue on coumadin 5 mg tablet daily.Notify provider for any signs of bleeding.Follow up in 2 weeks for INR check. - POC INR  2. Paroxysmal A-fib (HCC) Chest pain free.no palpitation.Continue on coumadin 5 mg tablet daily.Notify provider for any signs of  bleeding.Follow up in 2 weeks for INR  check.  Family/ staff Communication: Reviewed plan of care with patient and care giver.  Labs/tests ordered: None   Dinah C Ngetich, NP

## 2018-08-29 ENCOUNTER — Encounter: Payer: Self-pay | Admitting: Nurse Practitioner

## 2018-08-29 ENCOUNTER — Ambulatory Visit (INDEPENDENT_AMBULATORY_CARE_PROVIDER_SITE_OTHER): Payer: Medicare Other | Admitting: Nurse Practitioner

## 2018-08-29 VITALS — BP 124/78 | HR 72 | Temp 100.2°F | Ht 61.0 in | Wt 198.8 lb

## 2018-08-29 DIAGNOSIS — I480 Paroxysmal atrial fibrillation: Secondary | ICD-10-CM | POA: Diagnosis not present

## 2018-08-29 DIAGNOSIS — R300 Dysuria: Secondary | ICD-10-CM | POA: Diagnosis not present

## 2018-08-29 DIAGNOSIS — K121 Other forms of stomatitis: Secondary | ICD-10-CM | POA: Diagnosis not present

## 2018-08-29 DIAGNOSIS — Z7901 Long term (current) use of anticoagulants: Secondary | ICD-10-CM | POA: Diagnosis not present

## 2018-08-29 LAB — POCT URINALYSIS DIPSTICK
Bilirubin, UA: NEGATIVE
GLUCOSE UA: NEGATIVE
Ketones, UA: NEGATIVE
Nitrite, UA: POSITIVE
PROTEIN UA: POSITIVE — AB
Spec Grav, UA: 1.01 (ref 1.010–1.025)
Urobilinogen, UA: 0.2 E.U./dL
pH, UA: 5 (ref 5.0–8.0)

## 2018-08-29 LAB — PROTIME-INR
INR: 1.7 — ABNORMAL HIGH
PROTHROMBIN TIME: 17.5 s — AB (ref 9.0–11.5)

## 2018-08-29 MED ORDER — MAGIC MOUTHWASH W/LIDOCAINE: 5.0000 mL | Freq: Four times a day (QID) | ORAL | 0 refills | Status: DC

## 2018-08-29 MED ORDER — CIPROFLOXACIN HCL 500 MG PO TABS: 500.0000 mg | ORAL_TABLET | Freq: Two times a day (BID) | ORAL | 0 refills | Status: DC

## 2018-08-29 NOTE — Patient Instructions (Signed)
Swish and spit mouthwash- up to 4 times daily  Start cipro twice daily for 7 days  Follow up INR Monday

## 2018-08-29 NOTE — Progress Notes (Signed)
Careteam: Patient Care Team: Gayland Curry, DO as PCP - General (Geriatric Medicine) Tanda Rockers, MD as Consulting Physician (Pulmonary Disease) Deveron Furlong, NP as Nurse Practitioner (Psychiatry)  Advanced Directive information Pre-existing out of facility DNR order (yellow form or pink MOST form): Yellow form placed in chart (order not valid for inpatient use)  Allergies  Allergen Reactions  . Clindamycin Hcl Other (See Comments)    REACTION: swelling, pt. Reports that she had a 16 lb. weightgain in one day    Chief Complaint  Patient presents with  . Follow-up    Pt is being seen for an INR check. Pt is also having burning with urination and urgency for 2 days.      HPI: Patient is a 83 y.o. female seen in the office today for INR management.  INR via accucheck 1.9 She was previously 1.8 and was continued on coumadin 5 mg daily.   Not on any new medications or changes in medications.  No changes in diet.  Having ulcers in her mouth, dentist thinks its due to ill fitting dentures but she does not feel like it is associated with that. Has been going on the last 2-3 months. As not effected her eating.   Used to have frequent UTIs but has not had one in years, last night sat on the toilet for an hour due to pain and feeling like she had to go. Increased urgency, frequency and pain with urination. No abdominal pain. Low back discomfort. Reports temp of 100.1 while she has had sores her temp is up.    Lab Results  Component Value Date   INR 1.8 (A) 08/15/2018   INR 1.8 (A) 08/08/2018   INR 2.1 07/25/2018    Review of Systems:  Review of Systems  Constitutional: Negative for chills and fever.  HENT: Negative for nosebleeds.        Mouth sores, no sore throat   Respiratory: Negative for cough, hemoptysis and shortness of breath.   Cardiovascular: Positive for palpitations (chronic without changes). Negative for chest pain.  Gastrointestinal: Negative for blood  in stool.  Genitourinary: Positive for dysuria, frequency and urgency. Negative for hematuria.  Skin: Negative for itching and rash.  Neurological: Negative for headaches.    Past Medical History:  Diagnosis Date  . Anemia   . Anxiety   . Arthritis    hands, spine   . Blood transfusion   . Cervicalgia   . Constipation   . Cough    Wert-onset 08/2009, as of 2014- resolved   . Deep venous thrombosis ([place])    post op, rec'd/needed  blood thinner   . Depression   . Depression with anxiety   . Dysrhythmia   . Falls frequently    pt. reports that she was falling 3 times a day, has had PT at a facility for a while & now is getting home PT 2-3 times/ week   . Headache(784.0) 09/27/2011  . History of stress test    15-20 yrs.ago   . Hypotension   . Hypothyroidism   . Lumbar radiculopathy 09/26/2011  . Pneumonia    never been in hosp. for pneumonia   . Prediabetes   . Right hand fracture    Monday  . Shortness of breath   . Sleep apnea    doesn't use CPAP any longer, since weight loss   Past Surgical History:  Procedure Laterality Date  . ANTERIOR CERVICAL DECOMP/DISCECTOMY FUSION N/A 07/09/2013  Procedure: ANTERIOR CERVICAL DECOMPRESSION/DISCECTOMY FUSION 3 LEVELS;  Surgeon: Sinclair Ship, MD;  Location: [facility];  Service: Orthopedics;  Laterality: N/A;  Anterior cervical decompression fusion, cervical 3-4, cervical 4-5-, cervical 5-6 with instrumentation, allograft.  . APPENDECTOMY    . BACK SURGERY     2000  . BREAST EXCISIONAL BIOPSY     breast bxs on left x 2, breast bx of right x 1-all benign  . EYE SURGERY     cataracts bilateral /w IOL  . HERNIA REPAIR     umbilical hernia- [id number]  . JOINT REPLACEMENT  2006,2007   bilateral  . LAPAROSCOPIC OVARIAN CYSTECTOMY     not done by laparscopy-abdominal incision  . REPLACEMENT TOTAL KNEE BILATERAL    . TONSILLECTOMY     Social History:   reports that she quit smoking about 49 years ago. Her smoking use included  cigarettes. She has a 10.00 pack-year smoking history. She has never used smokeless tobacco. She reports that she drinks about 1.0 standard drinks of alcohol per week. She reports that she does not use drugs.  Family History  Problem Relation Age of Onset  . Heart disease Father   . Malignant hyperthermia Father   . Arthritis Father   . Deep vein thrombosis Son   . Diabetes Son   . Obesity Son   . Depression Son   . Hypertension Son   . Parkinson's disease Mother   . Diabetes Daughter   . Hypertension Daughter   . Anxiety disorder Daughter   . Hyperlipidemia Son   . Hypertension Son   . Cancer Maternal Aunt     Medications: Patient's Medications  New Prescriptions   No medications on file  Previous Medications   BREXPIPRAZOLE 1 MG TABS    Take 1 tablet by mouth at bedtime.   BUSPIRONE (BUSPAR) 15 MG TABLET    Take 15 mg by mouth daily.   CALCIUM CITRATE PO    Take 600 mg by mouth 2 (two) times daily.   CLONAZEPAM (KLONOPIN) 0.5 MG TABLET    Take 1 tablet by mouth as needed in middle of the nite to go back to sleep   CRANBERRY EXTRACT PO    Take 2 tablets by mouth daily.   DULOXETINE (CYMBALTA) 60 MG CAPSULE    Take 2 capsules (120 mg total) by mouth daily.   FUROSEMIDE (LASIX) 40 MG TABLET    Take 0.5 tablets (20 mg total) by mouth daily.   LEVOTHYROXINE (SYNTHROID, LEVOTHROID) 50 MCG TABLET    TAKE ONE TABLET BY MOUTH ONCE DAILY 30 MINUTES BEFORE BREAKFAST FOR THYROID   MIRABEGRON ER (MYRBETRIQ) 25 MG TB24 TABLET    Take 1 tablet (25 mg total) by mouth daily.   MIRTAZAPINE (REMERON) 15 MG TABLET    Take 7.5 mg by mouth at bedtime.   MULTIPLE VITAMINS-MINERALS (MULTIVITAMIN PO)    Take 1 tablet by mouth.   OXYCODONE-ACETAMINOPHEN (PERCOCET) 10-325 MG TABLET    Take 1 tab every 4 hrs as needed for moderate pain and 2 tabs every 4 hrs as needed for severe pain   TRAZODONE (DESYREL) 100 MG TABLET    Take 2 tablets by mouth at bedtime   WARFARIN (COUMADIN) 5 MG TABLET    Take 5 mg  by mouth daily.  Modified Medications   No medications on file  Discontinued Medications   MELOXICAM (MOBIC) 15 MG TABLET    TAKE 1 TABLET BY MOUTH EVERY DAY     Physical  Exam:  Vitals:   08/29/18 1142  BP: 124/78  Pulse: 72  Temp: 100.2 F (37.9 C)  TempSrc: Oral  SpO2: 97%  Weight: 198 lb 12.8 oz (90.2 kg)  Height: 5\' 1"  (1.549 m)   Body mass index is 37.56 kg/m.  Physical Exam  Constitutional: She is oriented to person, place, and time. She appears well-developed and well-nourished. No distress.  HENT:  Head: Normocephalic and atraumatic.  Mouth/Throat: Oropharynx is clear and moist and mucous membranes are normal.  2 small ulcers noted to inside left lip. No erythema or drainage. Pt reports they are healing but still bothersome.   Cardiovascular: Normal rate, regular rhythm, normal heart sounds and intact distal pulses.  Pulmonary/Chest: Effort normal and breath sounds normal. No respiratory distress.  Abdominal: Soft. Bowel sounds are normal. She exhibits no distension. There is no tenderness.  Musculoskeletal: Normal range of motion. She exhibits no tenderness.  Lymphadenopathy:    She has no cervical adenopathy.  Neurological: She is alert and oriented to person, place, and time.  Skin: Skin is warm and dry. Capillary refill takes less than 2 seconds.  Psychiatric: She has a normal mood and affect.    Labs reviewed: Basic Metabolic Panel: Recent Labs    10/18/17 0949 06/03/18 0955  NA 138 137  K 4.2 3.9  CL 99 99  CO2 28 31  GLUCOSE 108* 100*  BUN 29* 30*  CREATININE 0.93* 1.08*  CALCIUM 9.3 9.4  TSH  --  1.23   Liver Function Tests: Recent Labs    10/18/17 0949 06/03/18 0955  AST 15 15  ALT 8 7  BILITOT 0.4 0.5  PROT 6.6 6.5   No results for input(s): LIPASE, AMYLASE in the last 8760 hours. No results for input(s): AMMONIA in the last 8760 hours. CBC: Recent Labs    10/18/17 0949 06/03/18 0955 07/04/18 1652  WBC 6.9 4.7 6.3    NEUTROABS 4,816 2,966 3,679  HGB 12.7 13.5 13.1  HCT 38.3 39.9 38.7  MCV 85.7 89.7 89.2  PLT 237 219 254   Lipid Panel: No results for input(s): CHOL, HDL, LDLCALC, TRIG, CHOLHDL, LDLDIRECT in the last 8760 hours. TSH: Recent Labs    06/03/18 0955  TSH 1.23   A1C: Lab Results  Component Value Date   HGBA1C 5.2 06/03/2018     Assessment/Plan 1. Long term (current) use of anticoagulants - Protime-INR 1.9, to continue 5 mg by mouth daily for anticoagulation due to a fib, goal 2-3  2. Dysuria - POC Urinalysis Dipstick, abnormal and in the setting of fevers will go ahead and treat with ciprofloxacin (CIPRO) 500 MG tablet; Take 1 tablet (500 mg total) by mouth 2 (two) times daily.  Dispense: 14 tablet; Refill: 0  3. Paroxysmal A-fib (HCC) Rate controlled, continues on coumadin.   4. Ulcer mouth ?viral vs ill fitting dentures. Using essential oil which have been benefiting pt. Will add magic mouthwash as needed Also doing salt water gargles, to continue this.  - magic mouthwash w/lidocaine SOLN; Take 5 mLs by mouth 4 (four) times daily. Swish and spit  Dispense: 240 mL; Refill: 0  Next appt: 09/02/18 Carlos American. [place], [facility] [phone]

## 2018-08-31 LAB — URINE CULTURE
MICRO NUMBER:: 91221813
SPECIMEN QUALITY:: ADEQUATE

## 2018-09-02 ENCOUNTER — Encounter: Payer: Self-pay | Admitting: Nurse Practitioner

## 2018-09-02 ENCOUNTER — Ambulatory Visit (INDEPENDENT_AMBULATORY_CARE_PROVIDER_SITE_OTHER): Payer: Medicare Other | Admitting: Nurse Practitioner

## 2018-09-02 VITALS — BP 126/70 | HR 77 | Temp 98.7°F

## 2018-09-02 DIAGNOSIS — Z7901 Long term (current) use of anticoagulants: Secondary | ICD-10-CM | POA: Diagnosis not present

## 2018-09-02 DIAGNOSIS — N3000 Acute cystitis without hematuria: Secondary | ICD-10-CM

## 2018-09-02 DIAGNOSIS — G894 Chronic pain syndrome: Secondary | ICD-10-CM | POA: Diagnosis not present

## 2018-09-02 DIAGNOSIS — I480 Paroxysmal atrial fibrillation: Secondary | ICD-10-CM | POA: Diagnosis not present

## 2018-09-02 LAB — POCT INR: INR: 2.1 (ref 2.0–3.0)

## 2018-09-02 MED ORDER — OXYCODONE-ACETAMINOPHEN 10-325 MG PO TABS: ORAL_TABLET | ORAL | 0 refills | Status: DC

## 2018-09-02 NOTE — Progress Notes (Signed)
Careteam: Patient Care Team: Gayland Curry, DO as PCP - General (Geriatric Medicine) Tanda Rockers, MD as Consulting Physician (Pulmonary Disease) Deveron Furlong, NP as Nurse Practitioner (Psychiatry)  Advanced Directive information    Allergies  Allergen Reactions  . Clindamycin Hcl Other (See Comments)    REACTION: swelling, pt. Reports that she had a 16 lb. weightgain in one day    Chief Complaint  Patient presents with  . Anticoagulation    4 day PT/INR recheck   . Medication Refill    Oxycodone      HPI: Patient is a 83 y.o. female seen in the office today to follow up INR.  At last OV INR 1.7 and she continued on coumadin 5 mg daily due to being started on Cipro for UTI.  INR TODAY 2.1 Has low grade temp of 99.8 2 days but none since.  Sensitivities came back with greater than 100,000 colonies of e. Coli noted, sensitive to Cipro. Reports urinary symptoms feeling better.   Started magic mouthwash which was 65$! But has helped symptoms. No new sores.  Overall feels like she is doing better.    Review of Systems:  Review of Systems  Constitutional: Negative for chills and fever.  HENT: Negative for nosebleeds.        Mouth sores unchanged  Respiratory: Negative for cough, hemoptysis and shortness of breath.   Cardiovascular: Positive for palpitations (chronic without changes). Negative for chest pain.  Gastrointestinal: Negative for blood in stool.  Genitourinary: Negative for dysuria, frequency, hematuria and urgency.  Musculoskeletal: Negative for myalgias.  Skin: Negative for itching and rash.  Neurological: Negative for headaches.    Past Medical History:  Diagnosis Date  . Anemia   . Anxiety   . Arthritis    hands, spine   . Blood transfusion   . Cervicalgia   . Constipation   . Cough    Wert-onset 08/2009, as of 2014- resolved   . Deep venous thrombosis ([place])    post op, rec'd/needed  blood thinner   . Depression   . Depression with  anxiety   . Dysrhythmia   . Falls frequently    pt. reports that she was falling 3 times a day, has had PT at a facility for a while & now is getting home PT 2-3 times/ week   . Headache(784.0) 09/27/2011  . History of stress test    15-20 yrs.ago   . Hypotension   . Hypothyroidism   . Lumbar radiculopathy 09/26/2011  . Pneumonia    never been in hosp. for pneumonia   . Prediabetes   . Right hand fracture    Monday  . Shortness of breath   . Sleep apnea    doesn't use CPAP any longer, since weight loss   Past Surgical History:  Procedure Laterality Date  . ANTERIOR CERVICAL DECOMP/DISCECTOMY FUSION N/A 07/09/2013   Procedure: ANTERIOR CERVICAL DECOMPRESSION/DISCECTOMY FUSION 3 LEVELS;  Surgeon: Sinclair Ship, MD;  Location: [facility];  Service: Orthopedics;  Laterality: N/A;  Anterior cervical decompression fusion, cervical 3-4, cervical 4-5-, cervical 5-6 with instrumentation, allograft.  . APPENDECTOMY    . BACK SURGERY     2000  . BREAST EXCISIONAL BIOPSY     breast bxs on left x 2, breast bx of right x 1-all benign  . EYE SURGERY     cataracts bilateral /w IOL  . HERNIA REPAIR     umbilical hernia- 9702  . JOINT REPLACEMENT  [id number]   bilateral  . LAPAROSCOPIC OVARIAN CYSTECTOMY     not done by laparscopy-abdominal incision  . REPLACEMENT TOTAL KNEE BILATERAL    . TONSILLECTOMY     Social History:   reports that she quit smoking about 49 years ago. Her smoking use included cigarettes. She has a 10.00 pack-year smoking history. She has never used smokeless tobacco. She reports that she drinks about 1.0 standard drinks of alcohol per week. She reports that she does not use drugs.  Family History  Problem Relation Age of Onset  . Heart disease Father   . Malignant hyperthermia Father   . Arthritis Father   . Deep vein thrombosis Son   . Diabetes Son   . Obesity Son   . Depression Son   . Hypertension Son   . Parkinson's disease Mother   . Diabetes Daughter     . Hypertension Daughter   . Anxiety disorder Daughter   . Hyperlipidemia Son   . Hypertension Son   . Cancer Maternal Aunt     Medications: Patient's Medications  New Prescriptions   No medications on file  Previous Medications   BREXPIPRAZOLE 1 MG TABS    Take 1 tablet by mouth at bedtime.   BUSPIRONE (BUSPAR) 15 MG TABLET    Take 15 mg by mouth daily.   CALCIUM CITRATE PO    Take 600 mg by mouth 2 (two) times daily.   CIPROFLOXACIN (CIPRO) 500 MG TABLET    Take 1 tablet (500 mg total) by mouth 2 (two) times daily.   CLONAZEPAM (KLONOPIN) 0.5 MG TABLET    Take 1 tablet by mouth as needed in middle of the nite to go back to sleep   CRANBERRY EXTRACT PO    Take 2 tablets by mouth daily.   DULOXETINE (CYMBALTA) 60 MG CAPSULE    Take 2 capsules (120 mg total) by mouth daily.   FUROSEMIDE (LASIX) 40 MG TABLET    Take 0.5 tablets (20 mg total) by mouth daily.   LEVOTHYROXINE (SYNTHROID, LEVOTHROID) 50 MCG TABLET    TAKE ONE TABLET BY MOUTH ONCE DAILY 30 MINUTES BEFORE BREAKFAST FOR THYROID   MAGIC MOUTHWASH W/LIDOCAINE SOLN    Take 5 mLs by mouth 4 (four) times daily. Swish and spit   MIRABEGRON ER (MYRBETRIQ) 25 MG TB24 TABLET    Take 1 tablet (25 mg total) by mouth daily.   MIRTAZAPINE (REMERON) 15 MG TABLET    Take 7.5 mg by mouth at bedtime.   MULTIPLE VITAMINS-MINERALS (MULTIVITAMIN PO)    Take 1 tablet by mouth.   OXYCODONE-ACETAMINOPHEN (PERCOCET) 10-325 MG TABLET    Take 1 tab every 4 hrs as needed for moderate pain and 2 tabs every 4 hrs as needed for severe pain   TRAZODONE (DESYREL) 100 MG TABLET    Take 2 tablets by mouth at bedtime   WARFARIN (COUMADIN) 5 MG TABLET    Take 5 mg by mouth daily.  Modified Medications   No medications on file  Discontinued Medications   No medications on file     Physical Exam:  Vitals:   09/02/18 1132  BP: 126/70  Pulse: 77  Temp: 98.7 F (37.1 C)  TempSrc: Oral  SpO2: 97%   There is no height or weight on file to calculate  BMI.  Physical Exam  Constitutional: She is oriented to person, place, and time. She appears well-developed and well-nourished. No distress.  HENT:  Head: Normocephalic and atraumatic.  Mouth/Throat: Oropharynx  is clear and moist and mucous membranes are normal.  2 small ulcers remainto inside left lip. No erythema or drainage.   Cardiovascular: Normal rate, regular rhythm, normal heart sounds and intact distal pulses.  Pulmonary/Chest: Effort normal and breath sounds normal. No respiratory distress.  Abdominal: Soft. Bowel sounds are normal. She exhibits no distension. There is no tenderness.  Musculoskeletal: Normal range of motion. She exhibits no tenderness.  Lymphadenopathy:    She has no cervical adenopathy.  Neurological: She is alert and oriented to person, place, and time.  Skin: Skin is warm and dry. Capillary refill takes less than 2 seconds.  Psychiatric: She has a normal mood and affect.    Labs reviewed: Basic Metabolic Panel: Recent Labs    10/18/17 0949 06/03/18 0955  NA 138 137  K 4.2 3.9  CL 99 99  CO2 28 31  GLUCOSE 108* 100*  BUN 29* 30*  CREATININE 0.93* 1.08*  CALCIUM 9.3 9.4  TSH  --  1.23   Liver Function Tests: Recent Labs    10/18/17 0949 06/03/18 0955  AST 15 15  ALT 8 7  BILITOT 0.4 0.5  PROT 6.6 6.5   No results for input(s): LIPASE, AMYLASE in the last 8760 hours. No results for input(s): AMMONIA in the last 8760 hours. CBC: Recent Labs    10/18/17 0949 06/03/18 0955 07/04/18 1652  WBC 6.9 4.7 6.3  NEUTROABS 4,816 2,966 3,679  HGB 12.7 13.5 13.1  HCT 38.3 39.9 38.7  MCV 85.7 89.7 89.2  PLT 237 219 254   Lipid Panel: No results for input(s): CHOL, HDL, LDLCALC, TRIG, CHOLHDL, LDLDIRECT in the last 8760 hours. TSH: Recent Labs    06/03/18 0955  TSH 1.23   A1C: Lab Results  Component Value Date   HGBA1C 5.2 06/03/2018     Assessment/Plan 1. Chronic pain syndrome Due for refill which has been verified  -  oxyCODONE-acetaminophen (PERCOCET) 10-325 MG tablet; Take 1 tab every 4 hrs as needed for moderate pain and 2 tabs every 4 hrs as needed for severe pain  Dispense: 180 tablet; Refill: 0  2. Long term (current) use of anticoagulants - POC INR therapeutic today at 2.1, will continue current coumadin dose at 5 mg daily   3. Paroxysmal A-fib (HCC) -rate controlled, continue current regimen - POC INR  4. UTI -to complete full duration of Cipro for UTI, symptoms have improved currently. Continue to push fluids.   Next appt: 09/06/2018 to follow up INR Brecklynn Jian K. [place], [facility] [phone]

## 2018-09-02 NOTE — Patient Instructions (Addendum)
CONTINUE 5 mg of COUMADIN DAILY  FOLLOW UP INR ON THIS Friday 09/06/18

## 2018-09-06 ENCOUNTER — Encounter: Payer: Self-pay | Admitting: Nurse Practitioner

## 2018-09-06 ENCOUNTER — Ambulatory Visit (INDEPENDENT_AMBULATORY_CARE_PROVIDER_SITE_OTHER): Payer: Medicare Other | Admitting: Nurse Practitioner

## 2018-09-06 VITALS — BP 122/74 | HR 68 | Temp 99.1°F | Ht 61.0 in | Wt 200.0 lb

## 2018-09-06 DIAGNOSIS — K121 Other forms of stomatitis: Secondary | ICD-10-CM | POA: Diagnosis not present

## 2018-09-06 DIAGNOSIS — I480 Paroxysmal atrial fibrillation: Secondary | ICD-10-CM

## 2018-09-06 DIAGNOSIS — Z7901 Long term (current) use of anticoagulants: Secondary | ICD-10-CM | POA: Diagnosis not present

## 2018-09-06 DIAGNOSIS — N3000 Acute cystitis without hematuria: Secondary | ICD-10-CM | POA: Diagnosis not present

## 2018-09-06 LAB — CBC WITH DIFFERENTIAL/PLATELET
BASOS PCT: 0.6 %
Basophils Absolute: 49 cells/uL (ref 0–200)
EOS ABS: 123 {cells}/uL (ref 15–500)
EOS PCT: 1.5 %
HCT: 41.3 % (ref 35.0–45.0)
HEMOGLOBIN: 13.8 g/dL (ref 11.7–15.5)
Lymphs Abs: 1574 cells/uL (ref 850–3900)
MCH: 29.8 pg (ref 27.0–33.0)
MCHC: 33.4 g/dL (ref 32.0–36.0)
MCV: 89.2 fL (ref 80.0–100.0)
MONOS PCT: 7.2 %
MPV: 10 fL (ref 7.5–12.5)
NEUTROS ABS: 5863 {cells}/uL (ref 1500–7800)
Neutrophils Relative %: 71.5 %
PLATELETS: 253 10*3/uL (ref 140–400)
RBC: 4.63 10*6/uL (ref 3.80–5.10)
RDW: 12.5 % (ref 11.0–15.0)
TOTAL LYMPHOCYTE: 19.2 %
WBC mixed population: 590 cells/uL (ref 200–950)
WBC: 8.2 10*3/uL (ref 3.8–10.8)

## 2018-09-06 LAB — POCT INR: INR: 2.7 (ref 2.0–3.0)

## 2018-09-06 LAB — POCT URINALYSIS DIPSTICK
APPEARANCE: NORMAL
BILIRUBIN UA: NEGATIVE
Blood, UA: NEGATIVE
GLUCOSE UA: NEGATIVE
Ketones, UA: NEGATIVE
LEUKOCYTES UA: NEGATIVE
Nitrite, UA: NEGATIVE
Protein, UA: NEGATIVE
Spec Grav, UA: 1.02 (ref 1.010–1.025)
UROBILINOGEN UA: 0.2 U/dL
pH, UA: 7.5 (ref 5.0–8.0)

## 2018-09-06 NOTE — Patient Instructions (Signed)
CONT COUMADIN 5 MG DAILY  FOLLOW UP INR IN 7-10 DAYS

## 2018-09-06 NOTE — Addendum Note (Signed)
Addended by: Denyse Amass on: 09/06/2018 11:11 AM   Modules accepted: Orders

## 2018-09-06 NOTE — Progress Notes (Signed)
Careteam: Patient Care Team: Gayland Curry, DO as PCP - General (Geriatric Medicine) Tanda Rockers, MD as Consulting Physician (Pulmonary Disease) Deveron Furlong, NP as Nurse Practitioner (Psychiatry)  Advanced Directive information    Allergies  Allergen Reactions  . Clindamycin Hcl Other (See Comments)    REACTION: swelling, pt. Reports that she had a 16 lb. weightgain in one day    Chief Complaint  Patient presents with  . Follow-up    Pt is being seen for an INR check. Current INR is 2.7. Previous INR was 2.1 on 09/02/18     HPI: Patient is a 83 y.o. female seen in the office today for INR. Pt currently on Cipro for 10 days due to UTI with fever. INR was 2.1 4 days ago on 09/02/18 and she has been on Coumadin 5 mg daily  INR TODAY 2.7 Goal is 2-3 Has completed cipro, no missed doses. No unusual bleeding or bruising  Ulcers have healed and then she put on her denture and area opened back up.     Review of Systems:  Review of Systems  Constitutional: Negative for chills and fever.  HENT: Negative for nosebleeds.        Mouth sore  Respiratory: Negative for cough, hemoptysis and shortness of breath.   Cardiovascular: Negative for chest pain and palpitations.  Gastrointestinal: Negative for blood in stool.  Genitourinary: Negative for dysuria, frequency, hematuria and urgency.  Musculoskeletal: Negative for myalgias.  Skin: Negative for itching and rash.  Neurological: Negative for headaches.    Past Medical History:  Diagnosis Date  . Anemia   . Anxiety   . Arthritis    hands, spine   . Blood transfusion   . Cervicalgia   . Constipation   . Cough    Wert-onset 08/2009, as of 2014- resolved   . Deep venous thrombosis ([place])    post op, rec'd/needed  blood thinner   . Depression   . Depression with anxiety   . Dysrhythmia   . Falls frequently    pt. reports that she was falling 3 times a day, has had PT at a facility for a while & now is getting  home PT 2-3 times/ week   . Headache(784.0) 09/27/2011  . History of stress test    15-20 yrs.ago   . Hypotension   . Hypothyroidism   . Lumbar radiculopathy 09/26/2011  . Pneumonia    never been in hosp. for pneumonia   . Prediabetes   . Right hand fracture    Monday  . Shortness of breath   . Sleep apnea    doesn't use CPAP any longer, since weight loss   Past Surgical History:  Procedure Laterality Date  . ANTERIOR CERVICAL DECOMP/DISCECTOMY FUSION N/A 07/09/2013   Procedure: ANTERIOR CERVICAL DECOMPRESSION/DISCECTOMY FUSION 3 LEVELS;  Surgeon: Sinclair Ship, MD;  Location: [facility];  Service: Orthopedics;  Laterality: N/A;  Anterior cervical decompression fusion, cervical 3-4, cervical 4-5-, cervical 5-6 with instrumentation, allograft.  . APPENDECTOMY    . BACK SURGERY     2000  . BREAST EXCISIONAL BIOPSY     breast bxs on left x 2, breast bx of right x 1-all benign  . EYE SURGERY     cataracts bilateral /w IOL  . HERNIA REPAIR     umbilical hernia- 2992  . JOINT REPLACEMENT  2006,2007   bilateral  . LAPAROSCOPIC OVARIAN CYSTECTOMY     not done by laparscopy-abdominal incision  .  REPLACEMENT TOTAL KNEE BILATERAL    . TONSILLECTOMY     Social History:   reports that she quit smoking about 49 years ago. Her smoking use included cigarettes. She has a 10.00 pack-year smoking history. She has never used smokeless tobacco. She reports that she drinks about 1.0 standard drinks of alcohol per week. She reports that she does not use drugs.  Family History  Problem Relation Age of Onset  . Heart disease Father   . Malignant hyperthermia Father   . Arthritis Father   . Deep vein thrombosis Son   . Diabetes Son   . Obesity Son   . Depression Son   . Hypertension Son   . Parkinson's disease Mother   . Diabetes Daughter   . Hypertension Daughter   . Anxiety disorder Daughter   . Hyperlipidemia Son   . Hypertension Son   . Cancer Maternal Aunt      Medications: Patient's Medications  New Prescriptions   No medications on file  Previous Medications   BREXPIPRAZOLE 1 MG TABS    Take 1 tablet by mouth at bedtime.   BUSPIRONE (BUSPAR) 15 MG TABLET    Take 15 mg by mouth daily.   CALCIUM CITRATE PO    Take 600 mg by mouth 2 (two) times daily.   CLONAZEPAM (KLONOPIN) 0.5 MG TABLET    Take 1 tablet by mouth as needed in middle of the nite to go back to sleep   CRANBERRY EXTRACT PO    Take 2 tablets by mouth daily.   DULOXETINE (CYMBALTA) 60 MG CAPSULE    Take 2 capsules (120 mg total) by mouth daily.   FUROSEMIDE (LASIX) 40 MG TABLET    Take 0.5 tablets (20 mg total) by mouth daily.   LEVOTHYROXINE (SYNTHROID, LEVOTHROID) 50 MCG TABLET    TAKE ONE TABLET BY MOUTH ONCE DAILY 30 MINUTES BEFORE BREAKFAST FOR THYROID   MAGIC MOUTHWASH W/LIDOCAINE SOLN    Take 5 mLs by mouth 4 (four) times daily. Swish and spit   MIRABEGRON ER (MYRBETRIQ) 25 MG TB24 TABLET    Take 1 tablet (25 mg total) by mouth daily.   MIRTAZAPINE (REMERON) 15 MG TABLET    Take 7.5 mg by mouth at bedtime.   MULTIPLE VITAMINS-MINERALS (MULTIVITAMIN PO)    Take 1 tablet by mouth.   OXYCODONE-ACETAMINOPHEN (PERCOCET) 10-325 MG TABLET    Take 1 tab every 4 hrs as needed for moderate pain and 2 tabs every 4 hrs as needed for severe pain   TRAZODONE (DESYREL) 100 MG TABLET    Take 2 tablets by mouth at bedtime   WARFARIN (COUMADIN) 5 MG TABLET    Take 5 mg by mouth daily.  Modified Medications   No medications on file  Discontinued Medications   CIPROFLOXACIN (CIPRO) 500 MG TABLET    Take 1 tablet (500 mg total) by mouth 2 (two) times daily.     Physical Exam:  Vitals:   09/06/18 1037  BP: 122/74  Pulse: 68  Temp: 99.1 F (37.3 C)  TempSrc: Oral  SpO2: 96%  Weight: 200 lb (90.7 kg)  Height: 5\' 1"  (1.549 m)   Body mass index is 37.79 kg/m.  Physical Exam  Constitutional: She is oriented to person, place, and time. She appears well-developed and well-nourished.  No distress.  HENT:  Head: Normocephalic and atraumatic.  Mouth/Throat: Oropharynx is clear and moist and mucous membranes are normal.  1 small ulcer to left bottom mouth. No erythema or  drainage.   Cardiovascular: Normal rate, regular rhythm, normal heart sounds and intact distal pulses.  Pulmonary/Chest: Effort normal and breath sounds normal. No respiratory distress.  Abdominal: Soft. Bowel sounds are normal. She exhibits no distension. There is no tenderness.  Musculoskeletal: Normal range of motion. She exhibits no tenderness.  Lymphadenopathy:    She has no cervical adenopathy.  Neurological: She is alert and oriented to person, place, and time.  Skin: Skin is warm and dry. Capillary refill takes less than 2 seconds.  Psychiatric: She has a normal mood and affect.    Labs reviewed: Basic Metabolic Panel: Recent Labs    10/18/17 0949 06/03/18 0955  NA 138 137  K 4.2 3.9  CL 99 99  CO2 28 31  GLUCOSE 108* 100*  BUN 29* 30*  CREATININE 0.93* 1.08*  CALCIUM 9.3 9.4  TSH  --  1.23   Liver Function Tests: Recent Labs    10/18/17 0949 06/03/18 0955  AST 15 15  ALT 8 7  BILITOT 0.4 0.5  PROT 6.6 6.5   No results for input(s): LIPASE, AMYLASE in the last 8760 hours. No results for input(s): AMMONIA in the last 8760 hours. CBC: Recent Labs    10/18/17 0949 06/03/18 0955 07/04/18 1652  WBC 6.9 4.7 6.3  NEUTROABS 4,816 2,966 3,679  HGB 12.7 13.5 13.1  HCT 38.3 39.9 38.7  MCV 85.7 89.7 89.2  PLT 237 219 254   Lipid Panel: No results for input(s): CHOL, HDL, LDLCALC, TRIG, CHOLHDL, LDLDIRECT in the last 8760 hours. TSH: Recent Labs    06/03/18 0955  TSH 1.23   A1C: Lab Results  Component Value Date   HGBA1C 5.2 06/03/2018     Assessment/Plan 1. Long term (current) use of anticoagulants 2. Paroxysmal A-fib (HCC) -rate controlled, continues on coumadin for anticoagulation - POC INR 2.7, will continue 5 mg daily  3. Ulcer mouth Had healed and then  new ulcer appeared with wearing dentures, plans to follow up with dentist  4. Acute cystitis without hematuria -symptoms improved but still with low grade fever 99.1, will repeat UA  - CBC with Differential/Platelets  Next appt: 7-10 days for INR Willeen Novak K. [place], [facility] [phone]

## 2018-09-16 ENCOUNTER — Encounter: Payer: Self-pay | Admitting: Nurse Practitioner

## 2018-09-16 ENCOUNTER — Ambulatory Visit (INDEPENDENT_AMBULATORY_CARE_PROVIDER_SITE_OTHER): Payer: Medicare Other | Admitting: Nurse Practitioner

## 2018-09-16 VITALS — BP 118/72 | HR 73 | Temp 99.4°F | Ht 61.0 in | Wt 202.0 lb

## 2018-09-16 DIAGNOSIS — I480 Paroxysmal atrial fibrillation: Secondary | ICD-10-CM

## 2018-09-16 DIAGNOSIS — G894 Chronic pain syndrome: Secondary | ICD-10-CM

## 2018-09-16 DIAGNOSIS — Z7901 Long term (current) use of anticoagulants: Secondary | ICD-10-CM | POA: Diagnosis not present

## 2018-09-16 LAB — POCT INR: INR: 1.9 — AB (ref 2.0–3.0)

## 2018-09-16 MED ORDER — MELOXICAM 7.5 MG PO TABS: 7.5000 mg | ORAL_TABLET | Freq: Every day | ORAL | Status: DC

## 2018-09-16 MED ORDER — APIXABAN 5 MG PO TABS: 5.0000 mg | ORAL_TABLET | Freq: Two times a day (BID) | ORAL | 3 refills | Status: DC

## 2018-09-16 NOTE — Progress Notes (Signed)
Careteam: Patient Care Team: Gayland Curry, DO as PCP - General (Geriatric Medicine) Tanda Rockers, MD as Consulting Physician (Pulmonary Disease) Deveron Furlong, NP as Nurse Practitioner (Psychiatry)  Advanced Directive information Type of Advance Directive: Out of facility DNR (pink MOST or yellow form), Pre-existing out of facility DNR order (yellow form or pink MOST form): Yellow form placed in chart (order not valid for inpatient use)  Allergies  Allergen Reactions  . Clindamycin Hcl Other (See Comments)    REACTION: swelling, pt. Reports that she had a 16 lb. weightgain in one day    Chief Complaint  Patient presents with  . Follow-up    Pt is being seen for an INR check. Current INR is 1.9. Previous INR was 2.7 on 09/06/18.      HPI: Patient is a 83 y.o. female seen in the office today for INR check.  INR TODAY at 1.9, currently taking coumadin 5 mg daily. She is on coumadin for afib. GOAL INR 2 - 3. No abnormal bruising or bleeding INR has been fluctuating for several months unable to keep at goal   She was getting along good but then her knees started hurting so she went back on the mobic 15 mg daily, tylenol is not effective. This has been significantly helping the pain.     Review of Systems:  Review of Systems  Constitutional: Negative for chills and fever.  HENT: Negative for nosebleeds.        Mouth sore (healing)  Respiratory: Negative for cough, hemoptysis and shortness of breath.   Cardiovascular: Negative for chest pain and palpitations.  Gastrointestinal: Negative for blood in stool.  Genitourinary: Negative for dysuria, frequency, hematuria and urgency.  Musculoskeletal: Negative for myalgias.  Skin: Negative for itching and rash.  Neurological: Negative for headaches.   Past Medical History:  Diagnosis Date  . Anemia   . Anxiety   . Arthritis    hands, spine   . Blood transfusion   . Cervicalgia   . Constipation   . Cough    Wert-onset 08/2009, as of 2014- resolved   . Deep venous thrombosis ([place])    post op, rec'd/needed  blood thinner   . Depression   . Depression with anxiety   . Dysrhythmia   . Falls frequently    pt. reports that she was falling 3 times a day, has had PT at a facility for a while & now is getting home PT 2-3 times/ week   . Headache(784.0) 09/27/2011  . History of stress test    15-20 yrs.ago   . Hypotension   . Hypothyroidism   . Lumbar radiculopathy 09/26/2011  . Pneumonia    never been in hosp. for pneumonia   . Prediabetes   . Right hand fracture    Monday  . Shortness of breath   . Sleep apnea    doesn't use CPAP any longer, since weight loss   Past Surgical History:  Procedure Laterality Date  . ANTERIOR CERVICAL DECOMP/DISCECTOMY FUSION N/A 07/09/2013   Procedure: ANTERIOR CERVICAL DECOMPRESSION/DISCECTOMY FUSION 3 LEVELS;  Surgeon: Sinclair Ship, MD;  Location: [facility];  Service: Orthopedics;  Laterality: N/A;  Anterior cervical decompression fusion, cervical 3-4, cervical 4-5-, cervical 5-6 with instrumentation, allograft.  . APPENDECTOMY    . BACK SURGERY     2000  . BREAST EXCISIONAL BIOPSY     breast bxs on left x 2, breast bx of right x 1-all benign  . EYE  SURGERY     cataracts bilateral /w IOL  . HERNIA REPAIR     umbilical hernia- 2119  . JOINT REPLACEMENT  2006,2007   bilateral  . LAPAROSCOPIC OVARIAN CYSTECTOMY     not done by laparscopy-abdominal incision  . REPLACEMENT TOTAL KNEE BILATERAL    . TONSILLECTOMY     Social History:   reports that she quit smoking about 49 years ago. Her smoking use included cigarettes. She has a 10.00 pack-year smoking history. She has never used smokeless tobacco. She reports that she drinks about 1.0 standard drinks of alcohol per week. She reports that she does not use drugs.  Family History  Problem Relation Age of Onset  . Heart disease Father   . Malignant hyperthermia Father   . Arthritis Father   . Deep  vein thrombosis Son   . Diabetes Son   . Obesity Son   . Depression Son   . Hypertension Son   . Parkinson's disease Mother   . Diabetes Daughter   . Hypertension Daughter   . Anxiety disorder Daughter   . Hyperlipidemia Son   . Hypertension Son   . Cancer Maternal Aunt     Medications: Patient's Medications  New Prescriptions   No medications on file  Previous Medications   BREXPIPRAZOLE 1 MG TABS    Take 1 tablet by mouth at bedtime.   BUSPIRONE (BUSPAR) 15 MG TABLET    Take 15 mg by mouth daily.   CALCIUM CITRATE PO    Take 600 mg by mouth 2 (two) times daily.   CLONAZEPAM (KLONOPIN) 0.5 MG TABLET    Take 1 tablet by mouth as needed in middle of the nite to go back to sleep   CRANBERRY EXTRACT PO    Take 2 tablets by mouth daily.   DULOXETINE (CYMBALTA) 60 MG CAPSULE    Take 2 capsules (120 mg total) by mouth daily.   FUROSEMIDE (LASIX) 40 MG TABLET    Take 0.5 tablets (20 mg total) by mouth daily.   LEVOTHYROXINE (SYNTHROID, LEVOTHROID) 50 MCG TABLET    TAKE ONE TABLET BY MOUTH ONCE DAILY 30 MINUTES BEFORE BREAKFAST FOR THYROID   MAGIC MOUTHWASH W/LIDOCAINE SOLN    Take 5 mLs by mouth 4 (four) times daily. Swish and spit   MELOXICAM (MOBIC) 15 MG TABLET    Take 15 mg by mouth daily.   MIRABEGRON ER (MYRBETRIQ) 25 MG TB24 TABLET    Take 1 tablet (25 mg total) by mouth daily.   MIRTAZAPINE (REMERON) 15 MG TABLET    Take 7.5 mg by mouth at bedtime.   MULTIPLE VITAMINS-MINERALS (MULTIVITAMIN PO)    Take 1 tablet by mouth.   OXYCODONE-ACETAMINOPHEN (PERCOCET) 10-325 MG TABLET    Take 1 tab every 4 hrs as needed for moderate pain and 2 tabs every 4 hrs as needed for severe pain   TRAZODONE (DESYREL) 100 MG TABLET    Take 2 tablets by mouth at bedtime   WARFARIN (COUMADIN) 5 MG TABLET    Take 5 mg by mouth daily.  Modified Medications   No medications on file  Discontinued Medications   No medications on file     Physical Exam:  Vitals:   09/16/18 1526  BP: 118/72  Pulse:  73  Temp: 99.4 F (37.4 C)  TempSrc: Oral  SpO2: 96%  Weight: 202 lb (91.6 kg)  Height: 5\' 1"  (1.549 m)   Body mass index is 38.17 kg/m.  Physical Exam  Constitutional:  She is oriented to person, place, and time. She appears well-developed and well-nourished. No distress.  HENT:  Head: Normocephalic and atraumatic.  Mouth/Throat: Oropharynx is clear and moist and mucous membranes are normal.  Cardiovascular: Normal rate, regular rhythm, normal heart sounds and intact distal pulses.  Pulmonary/Chest: Effort normal and breath sounds normal. No respiratory distress.  Abdominal: Soft. Bowel sounds are normal. She exhibits no distension. There is no tenderness.  Musculoskeletal: Normal range of motion. She exhibits no tenderness.  Lymphadenopathy:    She has no cervical adenopathy.  Neurological: She is alert and oriented to person, place, and time.  Skin: Skin is warm and dry. Capillary refill takes less than 2 seconds.  Psychiatric: She has a normal mood and affect.    Labs reviewed: Basic Metabolic Panel: Recent Labs    10/18/17 0949 06/03/18 0955  NA 138 137  K 4.2 3.9  CL 99 99  CO2 28 31  GLUCOSE 108* 100*  BUN 29* 30*  CREATININE 0.93* 1.08*  CALCIUM 9.3 9.4  TSH  --  1.23   Liver Function Tests: Recent Labs    10/18/17 0949 06/03/18 0955  AST 15 15  ALT 8 7  BILITOT 0.4 0.5  PROT 6.6 6.5   No results for input(s): LIPASE, AMYLASE in the last 8760 hours. No results for input(s): AMMONIA in the last 8760 hours. CBC: Recent Labs    06/03/18 0955 07/04/18 1652 09/06/18 1110  WBC 4.7 6.3 8.2  NEUTROABS 2,966 3,679 5,863  HGB 13.5 13.1 13.8  HCT 39.9 38.7 41.3  MCV 89.7 89.2 89.2  PLT 219 254 253   Lipid Panel: No results for input(s): CHOL, HDL, LDLCALC, TRIG, CHOLHDL, LDLDIRECT in the last 8760 hours. TSH: Recent Labs    06/03/18 0955  TSH 1.23   A1C: Lab Results  Component Value Date   HGBA1C 5.2 06/03/2018     Assessment/Plan 1.  Long term (current) use of anticoagulants - POC INR 1.9, pt with multiple dose adjustments due to INR not being at goal. Will stop coumadin at this time and start eliquis 5 mg by mouth twice daily  - apixaban (ELIQUIS) 5 MG TABS tablet; Take 1 tablet (5 mg total) by mouth 2 (two) times daily.  Dispense: 60 tablet; Refill: 3  2. Paroxysmal A-fib (HCC) Rate controlled, will stop coumadin at this time (since unable to have INR  consistently at goal) and start eliquis 5 mg by mouth twice daily.   3. Chronic pain syndrome Has been on meloxicam chronically stopped taking for a while but due to increased pain restarted, will reduce to meloxicam 7.5 mg by mouth daily as needed pain. Discussed risk of taking NSAID with anticoagulant.   Next appt: 10/16/2018 as scheduled  Linell Shawn K. [place], [facility] [phone]

## 2018-09-16 NOTE — Patient Instructions (Addendum)
STOP COUMADIN   Start eliquis 5 mg by mouth twice daily   Would recommend ONLY taking Mobic 7.5 mg by mouth as needed pain   Apixaban oral tablets What is this medicine? APIXABAN (a PIX a ban) is an anticoagulant (blood thinner). It is used to lower the chance of stroke in people with a medical condition called atrial fibrillation. It is also used to treat or prevent blood clots in the lungs or in the veins. This medicine may be used for other purposes; ask your health care provider or pharmacist if you have questions. COMMON BRAND NAME(S): Eliquis What should I tell my health care provider before I take this medicine? They need to know if you have any of these conditions: -bleeding disorders -bleeding in the brain -blood in your stools (black or tarry stools) or if you have blood in your vomit -history of stomach bleeding -kidney disease -liver disease -mechanical heart valve -an unusual or allergic reaction to apixaban, other medicines, foods, dyes, or preservatives -pregnant or trying to get pregnant -breast-feeding How should I use this medicine? Take this medicine by mouth with a glass of water. Follow the directions on the prescription label. You can take it with or without food. If it upsets your stomach, take it with food. Take your medicine at regular intervals. Do not take it more often than directed. Do not stop taking except on your doctor's advice. Stopping this medicine may increase your risk of a blot clot. Be sure to refill your prescription before you run out of medicine. Talk to your pediatrician regarding the use of this medicine in children. Special care may be needed. Overdosage: If you think you have taken too much of this medicine contact a poison control center or emergency room at once. NOTE: This medicine is only for you. Do not share this medicine with others. What if I miss a dose? If you miss a dose, take it as soon as you can. If it is almost time for your  next dose, take only that dose. Do not take double or extra doses. What may interact with this medicine? This medicine may interact with the following: -aspirin and aspirin-like medicines -certain medicines for fungal infections like ketoconazole and itraconazole -certain medicines for seizures like carbamazepine and phenytoin -certain medicines that treat or prevent blood clots like warfarin, enoxaparin, and dalteparin -clarithromycin -NSAIDs, medicines for pain and inflammation, like ibuprofen or naproxen -rifampin -ritonavir -St. John's wort This list may not describe all possible interactions. Give your health care provider a list of all the medicines, herbs, non-prescription drugs, or dietary supplements you use. Also tell them if you smoke, drink alcohol, or use illegal drugs. Some items may interact with your medicine. What should I watch for while using this medicine? Visit your doctor or health care professional for regular checks on your progress. Notify your doctor or health care professional and seek emergency treatment if you develop breathing problems; changes in vision; chest pain; severe, sudden headache; pain, swelling, warmth in the leg; trouble speaking; sudden numbness or weakness of the face, arm or leg. These can be signs that your condition has gotten worse. If you are going to have surgery or other procedure, tell your doctor that you are taking this medicine. What side effects may I notice from receiving this medicine? Side effects that you should report to your doctor or health care professional as soon as possible: -allergic reactions like skin rash, itching or hives, swelling of the face, lips,  or tongue -signs and symptoms of bleeding such as bloody or black, tarry stools; red or dark-brown urine; spitting up blood or brown material that looks like coffee grounds; red spots on the skin; unusual bruising or bleeding from the eye, gums, or nose This list may not  describe all possible side effects. Call your doctor for medical advice about side effects. You may report side effects to FDA at 1-800-FDA-1088. Where should I keep my medicine? Keep out of the reach of children. Store at room temperature between 20 and 25 degrees C (68 and 77 degrees F). Throw away any unused medicine after the expiration date. NOTE: This sheet is a summary. It may not cover all possible information. If you have questions about this medicine, talk to your doctor, pharmacist, or health care provider.  2018 Elsevier/Gold Standard (2016-05-29 11:54:23)

## 2018-09-25 DIAGNOSIS — B351 Tinea unguium: Secondary | ICD-10-CM | POA: Diagnosis not present

## 2018-09-25 DIAGNOSIS — L600 Ingrowing nail: Secondary | ICD-10-CM | POA: Diagnosis not present

## 2018-10-07 DIAGNOSIS — L570 Actinic keratosis: Secondary | ICD-10-CM | POA: Diagnosis not present

## 2018-10-07 DIAGNOSIS — Z85828 Personal history of other malignant neoplasm of skin: Secondary | ICD-10-CM | POA: Diagnosis not present

## 2018-10-15 ENCOUNTER — Other Ambulatory Visit: Payer: Self-pay | Admitting: Internal Medicine

## 2018-10-16 ENCOUNTER — Other Ambulatory Visit: Payer: Self-pay

## 2018-10-16 ENCOUNTER — Other Ambulatory Visit: Payer: Medicare Other

## 2018-10-16 DIAGNOSIS — G894 Chronic pain syndrome: Secondary | ICD-10-CM

## 2018-10-16 DIAGNOSIS — E7800 Pure hypercholesterolemia, unspecified: Secondary | ICD-10-CM | POA: Diagnosis not present

## 2018-10-16 DIAGNOSIS — R739 Hyperglycemia, unspecified: Secondary | ICD-10-CM | POA: Diagnosis not present

## 2018-10-16 DIAGNOSIS — E039 Hypothyroidism, unspecified: Secondary | ICD-10-CM | POA: Diagnosis not present

## 2018-10-16 DIAGNOSIS — I480 Paroxysmal atrial fibrillation: Secondary | ICD-10-CM | POA: Diagnosis not present

## 2018-10-16 MED ORDER — OXYCODONE-ACETAMINOPHEN 10-325 MG PO TABS: ORAL_TABLET | ORAL | 0 refills | Status: DC

## 2018-10-16 NOTE — Telephone Encounter (Signed)
Patient requested a refill of oxycodone-apap 10-325 mg tablets while in the office for labs. Last refill was 09/02/18 per [place] database. Rx was pended to provider for approval.

## 2018-10-17 LAB — LIPID PANEL
Cholesterol: 172 mg/dL (ref <200)
HDL: 56 mg/dL (ref >50)
LDL Cholesterol (Calc): 97 mg/dL (calc)
Non-HDL Cholesterol (Calc): 116 mg/dL (calc) (ref <130)
Total CHOL/HDL Ratio: 3.1 (calc) (ref <5.0)
Triglycerides: 98 mg/dL (ref <150)

## 2018-10-17 LAB — HEMOGLOBIN A1C
Hgb A1c MFr Bld: 5.1 % of total Hgb (ref <5.7)
Mean Plasma Glucose: 100 (calc)
eAG (mmol/L): 5.5 (calc)

## 2018-10-17 LAB — CBC WITH DIFFERENTIAL/PLATELET
Basophils Absolute: 41 cells/uL (ref 0–200)
Basophils Relative: 0.8 %
Eosinophils Absolute: 138 cells/uL (ref 15–500)
Eosinophils Relative: 2.7 %
HCT: 39.5 % (ref 35.0–45.0)
Hemoglobin: 13.2 g/dL (ref 11.7–15.5)
Lymphs Abs: 1260 cells/uL (ref 850–3900)
MCH: 29.7 pg (ref 27.0–33.0)
MCHC: 33.4 g/dL (ref 32.0–36.0)
MCV: 89 fL (ref 80.0–100.0)
MPV: 10.4 fL (ref 7.5–12.5)
Monocytes Relative: 5.8 %
Neutro Abs: 3366 cells/uL (ref 1500–7800)
Neutrophils Relative %: 66 %
Platelets: 221 10*3/uL (ref 140–400)
RBC: 4.44 10*6/uL (ref 3.80–5.10)
RDW: 12.8 % (ref 11.0–15.0)
Total Lymphocyte: 24.7 %
WBC mixed population: 296 cells/uL (ref 200–950)
WBC: 5.1 10*3/uL (ref 3.8–10.8)

## 2018-10-17 LAB — COMPLETE METABOLIC PANEL WITH GFR
AG Ratio: 1.7 (calc) (ref 1.0–2.5)
ALT: 8 U/L (ref 6–29)
AST: 17 U/L (ref 10–35)
Albumin: 4 g/dL (ref 3.6–5.1)
Alkaline phosphatase (APISO): 62 U/L (ref 33–130)
BUN/Creatinine Ratio: 33 (calc) — ABNORMAL HIGH (ref 6–22)
BUN: 31 mg/dL — ABNORMAL HIGH (ref 7–25)
CO2: 30 mmol/L (ref 20–32)
Calcium: 9.2 mg/dL (ref 8.6–10.4)
Chloride: 102 mmol/L (ref 98–110)
Creat: 0.94 mg/dL — ABNORMAL HIGH (ref 0.60–0.88)
GFR, Est African American: 66 mL/min/{1.73_m2} (ref >=60)
GFR, Est Non African American: 57 mL/min/{1.73_m2} — ABNORMAL LOW (ref >=60)
Globulin: 2.3 g/dL (calc) (ref 1.9–3.7)
Glucose, Bld: 96 mg/dL (ref 65–99)
Potassium: 4 mmol/L (ref 3.5–5.3)
Sodium: 141 mmol/L (ref 135–146)
Total Bilirubin: 0.4 mg/dL (ref 0.2–1.2)
Total Protein: 6.3 g/dL (ref 6.1–8.1)

## 2018-10-17 LAB — TSH: TSH: 2 mIU/L (ref 0.40–4.50)

## 2018-10-21 DIAGNOSIS — I480 Paroxysmal atrial fibrillation: Secondary | ICD-10-CM | POA: Diagnosis not present

## 2018-10-21 DIAGNOSIS — E039 Hypothyroidism, unspecified: Secondary | ICD-10-CM | POA: Diagnosis not present

## 2018-10-21 DIAGNOSIS — R739 Hyperglycemia, unspecified: Secondary | ICD-10-CM | POA: Diagnosis not present

## 2018-10-21 DIAGNOSIS — E7800 Pure hypercholesterolemia, unspecified: Secondary | ICD-10-CM | POA: Diagnosis not present

## 2018-10-22 LAB — MICROALBUMIN / CREATININE URINE RATIO
Creatinine, Urine: 145 mg/dL (ref 20–275)
Microalb Creat Ratio: 21 mcg/mg creat (ref <30)
Microalb, Ur: 3.1 mg/dL

## 2018-10-24 ENCOUNTER — Encounter: Payer: Self-pay | Admitting: Internal Medicine

## 2018-10-24 ENCOUNTER — Ambulatory Visit (INDEPENDENT_AMBULATORY_CARE_PROVIDER_SITE_OTHER): Payer: Medicare Other | Admitting: Internal Medicine

## 2018-10-24 VITALS — BP 108/68 | HR 82 | Temp 99.0°F | Ht 61.0 in | Wt 196.0 lb

## 2018-10-24 DIAGNOSIS — E1169 Type 2 diabetes mellitus with other specified complication: Secondary | ICD-10-CM | POA: Diagnosis not present

## 2018-10-24 DIAGNOSIS — R739 Hyperglycemia, unspecified: Secondary | ICD-10-CM | POA: Diagnosis not present

## 2018-10-24 DIAGNOSIS — I480 Paroxysmal atrial fibrillation: Secondary | ICD-10-CM

## 2018-10-24 DIAGNOSIS — Z7901 Long term (current) use of anticoagulants: Secondary | ICD-10-CM | POA: Diagnosis not present

## 2018-10-24 DIAGNOSIS — R2689 Other abnormalities of gait and mobility: Secondary | ICD-10-CM | POA: Diagnosis not present

## 2018-10-24 DIAGNOSIS — E785 Hyperlipidemia, unspecified: Secondary | ICD-10-CM | POA: Diagnosis not present

## 2018-10-24 DIAGNOSIS — N3281 Overactive bladder: Secondary | ICD-10-CM

## 2018-10-24 DIAGNOSIS — I825Y3 Chronic embolism and thrombosis of unspecified deep veins of proximal lower extremity, bilateral: Secondary | ICD-10-CM | POA: Diagnosis not present

## 2018-10-24 DIAGNOSIS — R2681 Unsteadiness on feet: Secondary | ICD-10-CM

## 2018-10-24 DIAGNOSIS — E039 Hypothyroidism, unspecified: Secondary | ICD-10-CM

## 2018-10-24 MED ORDER — MIRABEGRON ER 50 MG PO TB24: 50.0000 mg | ORAL_TABLET | Freq: Every day | ORAL | 3 refills | Status: DC

## 2018-10-24 NOTE — Progress Notes (Signed)
Location:  [facility] clinic Provider:  Dantavious Snowball L. Mariea Clonts, D.O., C.M.D.  Code Status: DNR Goals of Care:  Advanced Directives 09/16/2018  Does Patient Have a Medical Advance Directive? -  Type of Advance Directive Out of facility DNR (pink MOST or yellow form)  Does patient want to make changes to medical advance directive? -  Copy of [place] in Chart? -  Would patient like information on creating a medical advance directive? -  Pre-existing out of facility DNR order (yellow form or pink MOST form) Yellow form placed in chart (order not valid for inpatient use)     Chief Complaint  Patient presents with  . Medical Management of Chronic Issues    74mth follow-up    HPI: Patient is a 83 y.o. female seen today for medical management of chronic diseases.    She is saying her walking is much worse and she wants to go back for some therapy.  She's gotten weak again.    Myrbetriq--asks if it comes in other doses.  Every time she stands up from lying, she wets clear down to her socks.    Otherwise, she's doing ok.  Tolerating eliquis ok.  It makes her nervous not needing the INR.    Labs were reviewed with her and her caregiver.  Past Medical History:  Diagnosis Date  . Anemia   . Anxiety   . Arthritis    hands, spine   . Blood transfusion   . Cervicalgia   . Constipation   . Cough    Wert-onset 08/2009, as of 2014- resolved   . Deep venous thrombosis ([place])    post op, rec'd/needed  blood thinner   . Depression   . Depression with anxiety   . Dysrhythmia   . Falls frequently    pt. reports that she was falling 3 times a day, has had PT at a facility for a while & now is getting home PT 2-3 times/ week   . Headache(784.0) 09/27/2011  . History of stress test    15-20 yrs.ago   . Hypotension   . Hypothyroidism   . Lumbar radiculopathy 09/26/2011  . Pneumonia    never been in hosp. for pneumonia   . Prediabetes   . Right hand fracture    Monday  .  Shortness of breath   . Sleep apnea    doesn't use CPAP any longer, since weight loss    Past Surgical History:  Procedure Laterality Date  . ANTERIOR CERVICAL DECOMP/DISCECTOMY FUSION N/A 07/09/2013   Procedure: ANTERIOR CERVICAL DECOMPRESSION/DISCECTOMY FUSION 3 LEVELS;  Surgeon: Sinclair Ship, MD;  Location: [place];  Service: Orthopedics;  Laterality: N/A;  Anterior cervical decompression fusion, cervical 3-4, cervical 4-5-, cervical 5-6 with instrumentation, allograft.  . APPENDECTOMY    . BACK SURGERY     2000  . BREAST EXCISIONAL BIOPSY     breast bxs on left x 2, breast bx of right x 1-all benign  . EYE SURGERY     cataracts bilateral /w IOL  . HERNIA REPAIR     umbilical hernia- 1025  . JOINT REPLACEMENT  2006,2007   bilateral  . LAPAROSCOPIC OVARIAN CYSTECTOMY     not done by laparscopy-abdominal incision  . REPLACEMENT TOTAL KNEE BILATERAL    . TONSILLECTOMY      Allergies  Allergen Reactions  . Clindamycin Hcl Other (See Comments)    REACTION: swelling, pt. Reports that she had a 16 lb. weightgain in one  day    Outpatient Encounter Medications as of 10/24/2018  Medication Sig  . apixaban (ELIQUIS) 5 MG TABS tablet Take 1 tablet (5 mg total) by mouth 2 (two) times daily.  . Brexpiprazole 1 MG TABS Take 1 tablet by mouth at bedtime.  . busPIRone (BUSPAR) 15 MG tablet Take 15 mg by mouth daily.  [name] CALCIUM CITRATE PO Take 600 mg by mouth 2 (two) times daily.  . clonazePAM (KLONOPIN) 0.5 MG tablet Take 1 tablet by mouth as needed in middle of the nite to go back to sleep  . CRANBERRY EXTRACT PO Take 2 tablets by mouth daily.  . DULoxetine (CYMBALTA) 60 MG capsule Take 2 capsules (120 mg total) by mouth daily.  . furosemide (LASIX) 40 MG tablet Take 0.5 tablets (20 mg total) by mouth daily.  [name] levothyroxine (SYNTHROID, LEVOTHROID) 50 MCG tablet TAKE ONE TABLET BY MOUTH ONCE DAILY 30 MINUTES BEFORE BREAKFAST FOR THYROID  . meloxicam (MOBIC) 7.5 MG tablet Take 1  tablet (7.5 mg total) by mouth daily.  . mirabegron ER (MYRBETRIQ) 25 MG TB24 tablet Take 1 tablet (25 mg total) by mouth daily.  . mirtazapine (REMERON) 15 MG tablet Take 7.5 mg by mouth at bedtime.  . Multiple Vitamins-Minerals (MULTIVITAMIN PO) Take 1 tablet by mouth.  . oxyCODONE-acetaminophen (PERCOCET) 10-325 MG tablet Take 1 tab every 4 hrs as needed for moderate pain and 2 tabs every 4 hrs as needed for severe pain  . traZODone (DESYREL) 100 MG tablet Take 2 tablets by mouth at bedtime  . [DISCONTINUED] magic mouthwash w/lidocaine SOLN Take 5 mLs by mouth 4 (four) times daily. Swish and spit   No facility-administered encounter medications on file as of 10/24/2018.     Review of Systems:  Review of Systems  Constitutional: Negative for chills, fever and malaise/fatigue.  HENT: Positive for hearing loss. Negative for congestion.   Eyes: Negative for blurred vision.       Glasses  Respiratory: Negative for shortness of breath.   Cardiovascular: Negative for chest pain, palpitations and leg swelling.  Gastrointestinal: Negative for abdominal pain, blood in stool, constipation, diarrhea and melena.  Genitourinary: Positive for frequency and urgency. Negative for dysuria, flank pain and hematuria.  Musculoskeletal: Negative for joint pain.  Skin: Negative for itching and rash.  Neurological: Positive for weakness. Negative for dizziness.       Unsteady gait  Endo/Heme/Allergies: Bruises/bleeds easily.  Psychiatric/Behavioral: Positive for memory loss. Negative for depression. The patient is not nervous/anxious and does not have insomnia.     Health Maintenance  Topic Date Due  . OPHTHALMOLOGY EXAM  01/02/2019  . FOOT EXAM  02/15/2019  . HEMOGLOBIN A1C  04/16/2019  . URINE MICROALBUMIN  10/22/2019  . TETANUS/TDAP  11/27/2026  . INFLUENZA VACCINE  Completed  . DEXA SCAN  Completed  . PNA vac Low Risk Adult  Completed    Physical Exam: Vitals:   10/24/18 0850  BP: 108/68    Pulse: 82  Temp: 99 F (37.2 C)  TempSrc: Oral  SpO2: 93%  Weight: 196 lb (88.9 kg)  Height: 5\' 1"  (1.549 m)   Body mass index is 37.03 kg/m. Physical Exam  Constitutional: She is oriented to person, place, and time. She appears well-developed and well-nourished. No distress.  HENT:  Head: Normocephalic and atraumatic.  Cardiovascular: Intact distal pulses.  irreg irreg  Pulmonary/Chest: Effort normal and breath sounds normal. No respiratory distress.  Abdominal: Bowel sounds are normal.  Musculoskeletal: Normal range of motion.  Unsteady gait with cane, weak on standing  Neurological: She is alert and oriented to person, place, and time.  Skin: Skin is warm and dry.  Psychiatric: She has a normal mood and affect.    Labs reviewed: Basic Metabolic Panel: Recent Labs    06/03/18 0955 10/16/18 0849  NA 137 141  K 3.9 4.0  CL 99 102  CO2 31 30  GLUCOSE 100* 96  BUN 30* 31*  CREATININE 1.08* 0.94*  CALCIUM 9.4 9.2  TSH 1.23 2.00   Liver Function Tests: Recent Labs    06/03/18 0955 10/16/18 0849  AST 15 17  ALT 7 8  BILITOT 0.5 0.4  PROT 6.5 6.3   No results for input(s): LIPASE, AMYLASE in the last 8760 hours. No results for input(s): AMMONIA in the last 8760 hours. CBC: Recent Labs    07/04/18 1652 09/06/18 1110 10/16/18 0849  WBC 6.3 8.2 5.1  NEUTROABS 3,679 5,863 3,366  HGB 13.1 13.8 13.2  HCT 38.7 41.3 39.5  MCV 89.2 89.2 89.0  PLT 254 253 221   Lipid Panel: Recent Labs    10/16/18 0849  CHOL 172  HDL 56  LDLCALC 97  TRIG 98  CHOLHDL 3.1   Lab Results  Component Value Date   HGBA1C 5.1 10/16/2018    Assessment/Plan 1. Overactive bladder -increase myrbetriq to 50mg  daily to see if it helps her incontinence, but I'm not so certain it will at this stage -use two of the 25mg  until they are used up, then we can send in the 50mg  dosage  2. Paroxysmal A-fib (HCC) -rate controlled and now doing fine on eliquis with stable cbc and renal  function - CBC with Differential/Platelet; Future  3. Long term (current) use of anticoagulants -cont eliquis therapy  4. Unsteady gait - recurs occasionally, will send back ot PT at location of her request - Ambulatory referral to Physical Therapy  5. Balance problems -recurs occasionally, return to pt - Ambulatory referral to Physical Therapy  6. Hyperglycemia -in normal range for a while now - Basic metabolic panel; Future  7. Adult hypothyroidism - cont current levothyroxine, f/u tsh - TSH; Future  8. Hyperlipidemia associated with type 2 diabetes mellitus ([place]) -satisfactory for her age and functional status less than 100  9. Obesity, morbid ([place]) -ongoing, but pt is high risk for nutritional decline with her poor intake so not pushing this issue--also has protected her with her falls  10. Chronic deep vein thrombosis (DVT) of proximal vein of both lower extremities (HCC) -on anticoagulation for this, as well  Labs/tests ordered:   Orders Placed This Encounter  Procedures  . CBC with Differential/Platelet    Standing Status:   Future    Standing Expiration Date:   10/25/2019  . TSH    Standing Status:   Future    Standing Expiration Date:   10/25/2019  . Basic metabolic panel    Standing Status:   Future    Standing Expiration Date:   10/25/2019    Order Specific Question:   Has the patient fasted?    Answer:   Yes  . Ambulatory referral to Physical Therapy    Referral Priority:   Routine    Referral Type:   Physical Medicine    Referral Reason:   Specialty Services Required    Referred to Provider:   Danella Sensing, PT    Requested Specialty:   Physical Therapy    Number of Visits Requested:   1  Next appt:  02/27/2019 med mgt, fasting labs before  Dharma Pare L. Naima Veldhuizen, D.O. [facility] [address], [place] [address] Cell Phone (Mon-Fri 8am-5pm):  [phone] On Call:  [phone] & follow prompts after  5pm & weekends Office Phone:  [phone] Office Fax:  [phone]

## 2018-10-30 ENCOUNTER — Other Ambulatory Visit: Payer: Self-pay | Admitting: Nurse Practitioner

## 2018-10-30 DIAGNOSIS — I480 Paroxysmal atrial fibrillation: Secondary | ICD-10-CM

## 2018-11-04 DIAGNOSIS — F331 Major depressive disorder, recurrent, moderate: Secondary | ICD-10-CM | POA: Diagnosis not present

## 2018-11-04 DIAGNOSIS — F341 Dysthymic disorder: Secondary | ICD-10-CM | POA: Diagnosis not present

## 2018-11-04 DIAGNOSIS — F411 Generalized anxiety disorder: Secondary | ICD-10-CM | POA: Diagnosis not present

## 2018-11-07 DIAGNOSIS — M6281 Muscle weakness (generalized): Secondary | ICD-10-CM | POA: Diagnosis not present

## 2018-11-07 DIAGNOSIS — R260 Ataxic gait: Secondary | ICD-10-CM | POA: Diagnosis not present

## 2018-11-19 DIAGNOSIS — M6281 Muscle weakness (generalized): Secondary | ICD-10-CM | POA: Diagnosis not present

## 2018-11-19 DIAGNOSIS — R260 Ataxic gait: Secondary | ICD-10-CM | POA: Diagnosis not present

## 2018-11-20 HISTORY — PX: BREAST LUMPECTOMY: SHX2

## 2018-11-21 DIAGNOSIS — R260 Ataxic gait: Secondary | ICD-10-CM | POA: Diagnosis not present

## 2018-11-21 DIAGNOSIS — M6281 Muscle weakness (generalized): Secondary | ICD-10-CM | POA: Diagnosis not present

## 2018-11-25 DIAGNOSIS — M6281 Muscle weakness (generalized): Secondary | ICD-10-CM | POA: Diagnosis not present

## 2018-11-25 DIAGNOSIS — R260 Ataxic gait: Secondary | ICD-10-CM | POA: Diagnosis not present

## 2018-11-27 DIAGNOSIS — R260 Ataxic gait: Secondary | ICD-10-CM | POA: Diagnosis not present

## 2018-11-27 DIAGNOSIS — M6281 Muscle weakness (generalized): Secondary | ICD-10-CM | POA: Diagnosis not present

## 2018-12-02 DIAGNOSIS — M6281 Muscle weakness (generalized): Secondary | ICD-10-CM | POA: Diagnosis not present

## 2018-12-02 DIAGNOSIS — R260 Ataxic gait: Secondary | ICD-10-CM | POA: Diagnosis not present

## 2018-12-04 DIAGNOSIS — R260 Ataxic gait: Secondary | ICD-10-CM | POA: Diagnosis not present

## 2018-12-04 DIAGNOSIS — M6281 Muscle weakness (generalized): Secondary | ICD-10-CM | POA: Diagnosis not present

## 2018-12-09 ENCOUNTER — Ambulatory Visit: Payer: Self-pay

## 2018-12-09 ENCOUNTER — Encounter: Payer: Medicare Other | Admitting: Family

## 2018-12-10 ENCOUNTER — Encounter: Payer: Self-pay | Admitting: Family

## 2018-12-10 ENCOUNTER — Ambulatory Visit (INDEPENDENT_AMBULATORY_CARE_PROVIDER_SITE_OTHER): Payer: Medicare Other | Admitting: Family

## 2018-12-10 VITALS — BP 110/70 | HR 70 | Temp 99.1°F | Resp 18 | Ht 61.0 in | Wt 197.0 lb

## 2018-12-10 DIAGNOSIS — Z0000 Encounter for general adult medical examination without abnormal findings: Secondary | ICD-10-CM | POA: Diagnosis not present

## 2018-12-10 DIAGNOSIS — G894 Chronic pain syndrome: Secondary | ICD-10-CM

## 2018-12-10 MED ORDER — OXYCODONE-ACETAMINOPHEN 10-325 MG PO TABS: ORAL_TABLET | ORAL | 0 refills | Status: DC

## 2018-12-10 NOTE — Progress Notes (Signed)
Subjective:   Graelyn Bihl is a 84 y.o. female who presents for Medicare Annual (Subsequent) preventive examination.  Review of Systems:   Cardiac Risk Factors include: advanced age (>15men, >49 women);smoking/ tobacco exposure;obesity (BMI >30kg/m2)     Objective:     Vitals: BP 110/70   Pulse 70   Temp 99.1 F (37.3 C) (Oral)   Resp 18   Ht 5\' 1"  (1.549 m)   Wt 197 lb (89.4 kg)   SpO2 93%   BMI 37.22 kg/m   Body mass index is 37.22 kg/m.  Advanced Directives 12/10/2018 09/16/2018 08/29/2018 07/18/2018 07/08/2018 06/20/2018 05/02/2018  Does Patient Have a Medical Advance Directive? Yes - - Yes Yes Yes Yes  Type of Advance Directive Out of facility DNR (pink MOST or yellow form) Out of facility DNR (pink MOST or yellow form) - Out of facility DNR (pink MOST or yellow form) Out of facility DNR (pink MOST or yellow form) Out of facility DNR (pink MOST or yellow form) Out of facility DNR (pink MOST or yellow form)  Does patient want to make changes to medical advance directive? No - Patient declined - - - - No - Patient declined -  Copy of [place] in Chart? Yes - validated most recent copy scanned in chart (See row information) - - - - - -  Would patient like information on creating a medical advance directive? - - - - - - -  Pre-existing out of facility DNR order (yellow form or pink MOST form) - Yellow form placed in chart (order not valid for inpatient use) Yellow form placed in chart (order not valid for inpatient use) Yellow form placed in chart (order not valid for inpatient use) Yellow form placed in chart (order not valid for inpatient use) Yellow form placed in chart (order not valid for inpatient use) Yellow form placed in chart (order not valid for inpatient use)    Tobacco Social History   Tobacco Use  Smoking Status Former Smoker  . Packs/day: 0.50  . Years: 20.00  . Pack years: 10.00  . Types: Cigarettes  . Last attempt to quit: 11/20/1968  .  Years since quitting: 50.0  Smokeless Tobacco Never Used  Tobacco Comment   smoked for approx. 10 years     Counseling given: Not Answered Comment: smoked for approx. 10 years   Clinical Intake:  Pre-visit preparation completed: No  Pain : 0-10 Pain Score: 6  Pain Type: Chronic pain Pain Location: Back Pain Descriptors / Indicators: Aching, Sharp Pain Onset: More than a month ago Pain Frequency: Intermittent Pain Relieving Factors: pain medication  Effect of Pain on Daily Activities: unable to stand for a long time   Pain Relieving Factors: pain medication   BMI - recorded: 37.22 Nutritional Status: BMI > 30  Obese Nutritional Risks: None Diabetes: No  How often do you need to have someone help you when you read instructions, pamphlets, or other written materials from your doctor or pharmacy?: 1 - Never What is the last grade level you completed in school?: 3 year degree  Interpreter Needed?: No  Information entered by ::   FNP-C   Past Medical History:  Diagnosis Date  . Anemia   . Anxiety   . Arthritis    hands, spine   . Blood transfusion   . Cervicalgia   . Constipation   . Cough    Wert-onset 08/2009, as of 2014- resolved   . Deep venous thrombosis ([place])  post op, rec'd/needed  blood thinner   . Depression   . Depression with anxiety   . Dysrhythmia   . Falls frequently    pt. reports that she was falling 3 times a day, has had PT at a facility for a while & now is getting home PT 2-3 times/ week   . Headache(784.0) 09/27/2011  . History of stress test    15-20 yrs.ago   . Hypotension   . Hypothyroidism   . Lumbar radiculopathy 09/26/2011  . Pneumonia    never been in hosp. for pneumonia   . Prediabetes   . Right hand fracture    Monday  . Shortness of breath   . Sleep apnea    doesn't use CPAP any longer, since weight loss   Past Surgical History:  Procedure Laterality Date  . ANTERIOR CERVICAL DECOMP/DISCECTOMY FUSION N/A  07/09/2013   Procedure: ANTERIOR CERVICAL DECOMPRESSION/DISCECTOMY FUSION 3 LEVELS;  Surgeon: Sinclair Ship, MD;  Location: [place];  Service: Orthopedics;  Laterality: N/A;  Anterior cervical decompression fusion, cervical 3-4, cervical 4-5-, cervical 5-6 with instrumentation, allograft.  . APPENDECTOMY    . BACK SURGERY     2000  . BREAST EXCISIONAL BIOPSY     breast bxs on left x 2, breast bx of right x 1-all benign  . EYE SURGERY     cataracts bilateral /w IOL  . HERNIA REPAIR     umbilical hernia- 2694  . JOINT REPLACEMENT  2006,2007   bilateral  . LAPAROSCOPIC OVARIAN CYSTECTOMY     not done by laparscopy-abdominal incision  . REPLACEMENT TOTAL KNEE BILATERAL    . TONSILLECTOMY     Family History  Problem Relation Age of Onset  . Heart disease Father   . Malignant hyperthermia Father   . Arthritis Father   . Deep vein thrombosis Son   . Diabetes Son   . Obesity Son   . Depression Son   . Hypertension Son   . Parkinson's disease Mother   . Diabetes Daughter   . Hypertension Daughter   . Anxiety disorder Daughter   . Hyperlipidemia Son   . Hypertension Son   . Cancer Maternal Aunt    Social History   Socioeconomic History  . Marital status: Single    Spouse name: Not on file  . Number of children: 3  . Years of education: College  . Highest education level: Not on file  Occupational History  . Occupation: Retired Animal nutritionist  . Financial resource strain: Not very hard  . Food insecurity:    Worry: Never true    Inability: Never true  . Transportation needs:    Medical: No    Non-medical: No  Tobacco Use  . Smoking status: Former Smoker    Packs/day: 0.50    Years: 20.00    Pack years: 10.00    Types: Cigarettes    Last attempt to quit: 11/20/1968    Years since quitting: 50.0  . Smokeless tobacco: Never Used  . Tobacco comment: smoked for approx. 10 years  Substance and Sexual Activity  . Alcohol use: Yes    Alcohol/week: 1.0 standard  drinks    Types: 1 Glasses of wine per week    Comment: rare  . Drug use: No  . Sexual activity: Never  Lifestyle  . Physical activity:    Days per week: 0 days    Minutes per session: 0 min  . Stress: Only a little  Relationships  .  Social connections:    Talks on phone: More than three times a week    Gets together: More than three times a week    Attends religious service: More than 4 times per year    Active member of club or organization: Yes    Attends meetings of clubs or organizations: Never    Relationship status: Divorced  Other Topics Concern  . Not on file  Social History Narrative   Patient is single and lives alone.   Patient is retired.   Patient has a college education.   Patient has three adult children   Patient is right-handed.   Patient drinks 3-4 cups of caffeine (soda and coffee) daily.   Walks with walker    Outpatient Encounter Medications as of 12/10/2018  Medication Sig  . apixaban (ELIQUIS) 5 MG TABS tablet Take 1 tablet (5 mg total) by mouth 2 (two) times daily.  . Brexpiprazole 1 MG TABS Take 1 tablet by mouth at bedtime.  . busPIRone (BUSPAR) 15 MG tablet Take 15 mg by mouth daily.  [name] CALCIUM CITRATE PO Take 600 mg by mouth 2 (two) times daily.  . clonazePAM (KLONOPIN) 0.5 MG tablet Take 1 tablet by mouth as needed in middle of the nite to go back to sleep  . CRANBERRY EXTRACT PO Take 2 tablets by mouth daily.  . DULoxetine (CYMBALTA) 60 MG capsule Take 2 capsules (120 mg total) by mouth daily.  . furosemide (LASIX) 40 MG tablet TAKE 1/2 TABLET BY MOUTH EVERY DAY  . levothyroxine (SYNTHROID, LEVOTHROID) 50 MCG tablet TAKE ONE TABLET BY MOUTH ONCE DAILY 30 MINUTES BEFORE BREAKFAST FOR THYROID  . mirabegron ER (MYRBETRIQ) 50 MG TB24 tablet Take 1 tablet (50 mg total) by mouth daily.  . mirtazapine (REMERON) 15 MG tablet Take 7.5 mg by mouth at bedtime.  . Multiple Vitamins-Minerals (MULTIVITAMIN PO) Take 1 tablet by mouth.  .  oxyCODONE-acetaminophen (PERCOCET) 10-325 MG tablet Take 1 tab every 4 hrs as needed for moderate pain and 2 tabs every 4 hrs as needed for severe pain  . traZODone (DESYREL) 100 MG tablet Take 2 tablets by mouth at bedtime  . [DISCONTINUED] meloxicam (MOBIC) 7.5 MG tablet Take 1 tablet (7.5 mg total) by mouth daily.   No facility-administered encounter medications on file as of 12/10/2018.     Activities of Daily Living In your present state of health, do you have any difficulty performing the following activities: 12/10/2018  Hearing? Y  Comment hearing both ears   Vision? N  Comment wears eye glasses and follows up with Opthalmology   Difficulty concentrating or making decisions? Y  Comment some times   Walking or climbing stairs? Y  Dressing or bathing? Y  Doing errands, shopping? Y  Preparing Food and eating ? Y  Using the Toilet? N  In the past six months, have you accidently leaked urine? Y  Do you have problems with loss of bowel control? N  Managing your Medications? Y  Managing your Finances? N  Housekeeping or managing your Housekeeping? Y  Some recent data might be hidden    Patient Care Team: Gayland Curry, DO as PCP - General (Geriatric Medicine) Tanda Rockers, MD as Consulting Physician (Pulmonary Disease) Deveron Furlong, NP as Nurse Practitioner (Psychiatry)    Assessment:   This is a routine wellness examination for [facility].  Exercise Activities and Dietary recommendations Exercise limited by: Other - see comments(limited by low back pain )  Goals    .  Weight (lb) < 150 lb (68 kg)     Starting 11/27/2016, I will attempt to decrease my weight by 50 lbs to reach a goal weight of 150 lb.  12/10/2018 Current exercise limited by lower back pain and left shoulder pain     . Weight (lb) < 170 lb (77.1 kg)     Pt will start going for walks to lose weight       Fall Risk Fall Risk  12/10/2018 10/24/2018 09/16/2018 08/29/2018 07/08/2018  Falls in the past year?  1 0 Yes No No  Number falls in past yr: 0 0 2 or more - -  Comment - - - - -  Injury with Fall? 1 0 Yes - -  Risk for fall due to : - - - - -  Follow up - - - - -   Is the patient's home free of loose throw rugs in walkways, pet beds, electrical cords, etc?   yes      Grab bars in the bathroom? yes      Handrails on the stairs?   yes      Adequate lighting?   yes  Depression Screen PHQ 2/9 Scores 12/10/2018 10/24/2018 06/20/2018 02/14/2018  PHQ - 2 Score 0 0 0 0  PHQ- 9 Score - - - -     Cognitive Function MMSE - Mini Mental State Exam 12/10/2018 12/03/2017 11/27/2016 03/05/2014  Orientation to time 5 5 5 5   Orientation to Place 4 4 5 5   Registration 3 3 3 3   Attention/ Calculation 4 5 5 5   Recall 2 1 3 3   Language- name 2 objects 2 2 2 2   Language- repeat 1 1 1 1   Language- follow 3 step command 2 3 3 3   Language- read & follow direction 1 1 1 1   Write a sentence 1 1 1 1   Copy design 0 1 1 1   Total score 25 27 30 30     Immunization History  Administered Date(s) Administered  . Influenza Split 08/14/2013  . Influenza Whole 08/20/2009  . Influenza, High Dose Seasonal PF 09/11/2017, 07/25/2018  . Influenza,inj,Quad PF,6+ Mos 08/04/2014, 09/06/2015, 07/10/2016  . Pneumococcal Conjugate-13 01/14/2015  . Pneumococcal Polysaccharide-23 07/11/2013  . Td 11/20/2005, 11/27/2016    Qualifies for Shingles Vaccine? Declined   Screening Tests Health Maintenance  Topic Date Due  . OPHTHALMOLOGY EXAM  01/02/2019  . FOOT EXAM  02/15/2019  . HEMOGLOBIN A1C  04/16/2019  . URINE MICROALBUMIN  10/22/2019  . TETANUS/TDAP  11/27/2026  . INFLUENZA VACCINE  Completed  . DEXA SCAN  Completed  . PNA vac Low Risk Adult  Completed    Cancer Screenings: Lung: Low Dose CT Chest recommended if Age 22-80 years, 30 pack-year currently smoking OR have quit w/in 15years. Patient does not qualify. Breast:  Up to date on Mammogram? Yes    Up to date of Bone Density/Dexa? Yes Colorectal: up to date     Additional Screenings:  Hepatitis C Screening: Declined    Plan:   Fall and safety Precaution   I have personally reviewed and noted the following in the patient's chart:   . Medical and social history . Use of alcohol, tobacco or illicit drugs  . Current medications and supplements . Functional ability and status . Nutritional status . Physical activity . Advanced directives . List of other physicians . Hospitalizations, surgeries, and ER visits in previous 12 months . Vitals . Screenings to include cognitive, depression, and falls .  Referrals and appointments  In addition, I have reviewed and discussed with patient certain preventive protocols, quality metrics, and best practice recommendations. A written personalized care plan for preventive services as well as general preventive health recommendations were provided to patient.   Sandrea Hughs, NP  12/10/2018

## 2018-12-10 NOTE — Patient Instructions (Signed)
Andrea [name] , Thank you for taking time to come for your Medicare Wellness Visit. I appreciate your ongoing commitment to your health goals. Please review the following plan we discussed and let me know if I can assist you in the future.   Screening recommendations/referrals: Colonoscopy: Up to date  Mammogram: Up to date  Bone Density: Up date  Recommended yearly ophthalmology/optometry visit for glaucoma screening and checkup Recommended yearly dental visit for hygiene and checkup  Vaccinations: Influenza vaccine: Annual up to date  Pneumococcal vaccine: up date  Tdap vaccine Up to date due next 11/27/2026 Shingles vaccine: Declined   Advanced directives: in chart   Conditions/risks identified: Obesity,history of smoking.Dietary and lifestyle modification discussed.   Next appointment: 1 year   Preventive Care 40 Years and Older, Female Preventive care refers to lifestyle choices and visits with your health care provider that can promote health and wellness. What does preventive care include?  A yearly physical exam. This is also called an annual well check.  Dental exams once or twice a year.  Routine eye exams. Ask your health care provider how often you should have your eyes checked.  Personal lifestyle choices, including:  Daily care of your teeth and gums.  Regular physical activity.  Eating a healthy diet.  Avoiding tobacco and drug use.  Limiting alcohol use.  Practicing safe sex.  Taking low-dose aspirin every day.  Taking vitamin and mineral supplements as recommended by your health care provider. What happens during an annual well check? The services and screenings done by your health care provider during your annual well check will depend on your age, overall health, lifestyle risk factors, and family history of disease. Counseling  Your health care provider may ask you questions about your:  Alcohol use.  Tobacco use.  Drug use.  Emotional  well-being.  Home and relationship well-being.  Sexual activity.  Eating habits.  History of falls.  Memory and ability to understand (cognition).  Work and work Statistician.  Reproductive health. Screening  You may have the following tests or measurements:  Height, weight, and BMI.  Blood pressure.  Lipid and cholesterol levels. These may be checked every 5 years, or more frequently if you are over 82 years old.  Skin check.  Lung cancer screening. You may have this screening every year starting at age 82 if you have a 30-pack-year history of smoking and currently smoke or have quit within the past 15 years.  Fecal occult blood test (FOBT) of the stool. You may have this test every year starting at age 82.  Flexible sigmoidoscopy or colonoscopy. You may have a sigmoidoscopy every 5 years or a colonoscopy every 10 years starting at age 82.  Hepatitis C blood test.  Hepatitis B blood test.  Sexually transmitted disease (STD) testing.  Diabetes screening. This is done by checking your blood sugar (glucose) after you have not eaten for a while (fasting). You may have this done every 1-3 years.  Bone density scan. This is done to screen for osteoporosis. You may have this done starting at age 82.  Mammogram. This may be done every 1-2 years. Talk to your health care provider about how often you should have regular mammograms. Talk with your health care provider about your test results, treatment options, and if necessary, the need for more tests. Vaccines  Your health care provider may recommend certain vaccines, such as:  Influenza vaccine. This is recommended every year.  Tetanus, diphtheria, and acellular pertussis (Tdap,  Td) vaccine. You may need a Td booster every 10 years.  Zoster vaccine. You may need this after age 20.  Pneumococcal 13-valent conjugate (PCV13) vaccine. One dose is recommended after age 70.  Pneumococcal polysaccharide (PPSV23) vaccine. One  dose is recommended after age 82. Talk to your health care provider about which screenings and vaccines you need and how often you need them. This information is not intended to replace advice given to you by your health care provider. Make sure you discuss any questions you have with your health care provider. Document Released: 12/03/2015 Document Revised: 07/26/2016 Document Reviewed: 09/07/2015 Elsevier Interactive Patient Education  2017 [employer] Prevention in the Home Falls can cause injuries. They can happen to people of all ages. There are many things you can do to make your home safe and to help prevent falls. What can I do on the outside of my home?  Regularly fix the edges of walkways and driveways and fix any cracks.  Remove anything that might make you trip as you walk through a door, such as a raised step or threshold.  Trim any bushes or trees on the path to your home.  Use bright outdoor lighting.  Clear any walking paths of anything that might make someone trip, such as rocks or tools.  Regularly check to see if handrails are loose or broken. Make sure that both sides of any steps have handrails.  Any raised decks and porches should have guardrails on the edges.  Have any leaves, snow, or ice cleared regularly.  Use sand or salt on walking paths during winter.  Clean up any spills in your garage right away. This includes oil or grease spills. What can I do in the bathroom?  Use night lights.  Install grab bars by the toilet and in the tub and shower. Do not use towel bars as grab bars.  Use non-skid mats or decals in the tub or shower.  If you need to sit down in the shower, use a plastic, non-slip stool.  Keep the floor dry. Clean up any water that spills on the floor as soon as it happens.  Remove soap buildup in the tub or shower regularly.  Attach bath mats securely with double-sided non-slip rug tape.  Do not have throw rugs and other  things on the floor that can make you trip. What can I do in the bedroom?  Use night lights.  Make sure that you have a light by your bed that is easy to reach.  Do not use any sheets or blankets that are too big for your bed. They should not hang down onto the floor.  Have a firm chair that has side arms. You can use this for support while you get dressed.  Do not have throw rugs and other things on the floor that can make you trip. What can I do in the kitchen?  Clean up any spills right away.  Avoid walking on wet floors.  Keep items that you use a lot in easy-to-reach places.  If you need to reach something above you, use a strong step stool that has a grab bar.  Keep electrical cords out of the way.  Do not use floor polish or wax that makes floors slippery. If you must use wax, use non-skid floor wax.  Do not have throw rugs and other things on the floor that can make you trip. What can I do with my stairs?  Do not  leave any items on the stairs.  Make sure that there are handrails on both sides of the stairs and use them. Fix handrails that are broken or loose. Make sure that handrails are as long as the stairways.  Check any carpeting to make sure that it is firmly attached to the stairs. Fix any carpet that is loose or worn.  Avoid having throw rugs at the top or bottom of the stairs. If you do have throw rugs, attach them to the floor with carpet tape.  Make sure that you have a light switch at the top of the stairs and the bottom of the stairs. If you do not have them, ask someone to add them for you. What else can I do to help prevent falls?  Wear shoes that:  Do not have high heels.  Have rubber bottoms.  Are comfortable and fit you well.  Are closed at the toe. Do not wear sandals.  If you use a stepladder:  Make sure that it is fully opened. Do not climb a closed stepladder.  Make sure that both sides of the stepladder are locked into place.  Ask  someone to hold it for you, if possible.  Clearly mark and make sure that you can see:  Any grab bars or handrails.  First and last steps.  Where the edge of each step is.  Use tools that help you move around (mobility aids) if they are needed. These include:  Canes.  Walkers.  Scooters.  Crutches.  Turn on the lights when you go into a dark area. Replace any light bulbs as soon as they burn out.  Set up your furniture so you have a clear path. Avoid moving your furniture around.  If any of your floors are uneven, fix them.  If there are any pets around you, be aware of where they are.  Review your medicines with your doctor. Some medicines can make you feel dizzy. This can increase your chance of falling. Ask your doctor what other things that you can do to help prevent falls. This information is not intended to replace advice given to you by your health care provider. Make sure you discuss any questions you have with your health care provider. Document Released: 09/02/2009 Document Revised: 04/13/2016 Document Reviewed: 12/11/2014 Elsevier Interactive Patient Education  2017 [employer].

## 2018-12-11 DIAGNOSIS — M6281 Muscle weakness (generalized): Secondary | ICD-10-CM | POA: Diagnosis not present

## 2018-12-11 DIAGNOSIS — R260 Ataxic gait: Secondary | ICD-10-CM | POA: Diagnosis not present

## 2018-12-18 DIAGNOSIS — M6281 Muscle weakness (generalized): Secondary | ICD-10-CM | POA: Diagnosis not present

## 2018-12-18 DIAGNOSIS — R260 Ataxic gait: Secondary | ICD-10-CM | POA: Diagnosis not present

## 2018-12-23 DIAGNOSIS — M6281 Muscle weakness (generalized): Secondary | ICD-10-CM | POA: Diagnosis not present

## 2018-12-23 DIAGNOSIS — R260 Ataxic gait: Secondary | ICD-10-CM | POA: Diagnosis not present

## 2018-12-25 ENCOUNTER — Other Ambulatory Visit: Payer: Self-pay | Admitting: *Deleted

## 2018-12-25 DIAGNOSIS — M79675 Pain in left toe(s): Secondary | ICD-10-CM | POA: Diagnosis not present

## 2018-12-25 DIAGNOSIS — B351 Tinea unguium: Secondary | ICD-10-CM | POA: Diagnosis not present

## 2018-12-25 DIAGNOSIS — M79674 Pain in right toe(s): Secondary | ICD-10-CM | POA: Diagnosis not present

## 2018-12-25 MED ORDER — MIRABEGRON ER 50 MG PO TB24: 50.0000 mg | ORAL_TABLET | Freq: Every day | ORAL | 3 refills | Status: DC

## 2018-12-25 NOTE — Telephone Encounter (Signed)
Patient daughter called and requested refill.

## 2018-12-26 DIAGNOSIS — M6281 Muscle weakness (generalized): Secondary | ICD-10-CM | POA: Diagnosis not present

## 2018-12-26 DIAGNOSIS — R260 Ataxic gait: Secondary | ICD-10-CM | POA: Diagnosis not present

## 2018-12-30 DIAGNOSIS — M6281 Muscle weakness (generalized): Secondary | ICD-10-CM | POA: Diagnosis not present

## 2018-12-30 DIAGNOSIS — R260 Ataxic gait: Secondary | ICD-10-CM | POA: Diagnosis not present

## 2019-01-01 DIAGNOSIS — M6281 Muscle weakness (generalized): Secondary | ICD-10-CM | POA: Diagnosis not present

## 2019-01-01 DIAGNOSIS — R260 Ataxic gait: Secondary | ICD-10-CM | POA: Diagnosis not present

## 2019-01-06 DIAGNOSIS — R260 Ataxic gait: Secondary | ICD-10-CM | POA: Diagnosis not present

## 2019-01-06 DIAGNOSIS — M6281 Muscle weakness (generalized): Secondary | ICD-10-CM | POA: Diagnosis not present

## 2019-01-07 ENCOUNTER — Other Ambulatory Visit: Payer: Self-pay | Admitting: Nurse Practitioner

## 2019-01-07 DIAGNOSIS — Z7901 Long term (current) use of anticoagulants: Secondary | ICD-10-CM

## 2019-01-07 NOTE — Telephone Encounter (Signed)
New Warning populated when attempting to refill medication, please advise

## 2019-01-08 DIAGNOSIS — R260 Ataxic gait: Secondary | ICD-10-CM | POA: Diagnosis not present

## 2019-01-08 DIAGNOSIS — M6281 Muscle weakness (generalized): Secondary | ICD-10-CM | POA: Diagnosis not present

## 2019-01-10 ENCOUNTER — Encounter: Payer: Self-pay | Admitting: Family

## 2019-01-10 ENCOUNTER — Ambulatory Visit
Admission: RE | Admit: 2019-01-10 | Discharge: 2019-01-10 | Disposition: A | Discharge status: Home / Self Care | Payer: Medicare Other | Source: Ambulatory Visit | Attending: Family | Admitting: Family

## 2019-01-10 ENCOUNTER — Ambulatory Visit (INDEPENDENT_AMBULATORY_CARE_PROVIDER_SITE_OTHER): Payer: Medicare Other | Admitting: Family

## 2019-01-10 VITALS — BP 122/78 | HR 69 | Temp 98.6°F | Ht 61.0 in | Wt 196.6 lb

## 2019-01-10 DIAGNOSIS — M7732 Calcaneal spur, left foot: Secondary | ICD-10-CM | POA: Diagnosis not present

## 2019-01-10 DIAGNOSIS — M79672 Pain in left foot: Secondary | ICD-10-CM

## 2019-01-10 NOTE — Progress Notes (Signed)
Provider: Joni Colegrove FNP-C  Gayland Curry, DO  Patient Care Team: Gayland Curry, DO as PCP - General (Geriatric Medicine) Tanda Rockers, MD as Consulting Physician (Pulmonary Disease) Deveron Furlong, NP as Nurse Practitioner (Psychiatry)  Extended Emergency Contact Information Primary Emergency Contact: Fenstermaker,Roberta(Bobbi) Address: [place], [place] [address] [name] Phone: [phone] Mobile Phone: [phone] Relation: Daughter Secondary Emergency Contact: Lomeli,David Address: [address]           [place], [place] [address] [place] Phone: [phone] Work Phone: [phone] Relation: Son  Goals of care: Advanced Directive information Advanced Directives 12/10/2018  Does Patient Have a Medical Advance Directive? Yes  Type of Advance Directive Out of facility DNR (pink MOST or yellow form)  Does patient want to make changes to medical advance directive? No - Patient declined  Copy of [place] in Chart? Yes - validated most recent copy scanned in chart (See row information)  Would patient like information on creating a medical advance directive? -  Pre-existing out of facility DNR order (yellow form or pink MOST form) -     Chief Complaint  Patient presents with  . Acute Visit    Pain in left foot stated had problems for 5 weeks now. Patient also c/o of itchy ears     HPI:  Pt is a 84 y.o. female seen today for an acute visit for evaluation of left top of the foot pain x 5 weeks.she is here with her daughter who wonders whether she might have drop something on her leg and did feel it.she states pain was worse last night could not sleep.she does have percocet for pain but states didn't think she could take it for her foot pain.she states the last fall episode she fell backwards does not think she injured her leg.she denies any swelling on the foot.she does have chronic skin  red-bluish color on both legs.   She would also like for her ears to be checked states itching a lot.    Past Medical History:  Diagnosis Date  . Anemia   . Anxiety   . Arthritis    hands, spine   . Blood transfusion   . Cervicalgia   . Constipation   . Cough    Wert-onset 08/2009, as of 2014- resolved   . Deep venous thrombosis ([facility])    post op, rec'd/needed  blood thinner   . Depression   . Depression with anxiety   . Dysrhythmia   . Falls frequently    pt. reports that she was falling 3 times a day, has had PT at a facility for a while & now is getting home PT 2-3 times/ week   . Headache(784.0) 09/27/2011  . History of stress test    15-20 yrs.ago   . Hypotension   . Hypothyroidism   . Lumbar radiculopathy 09/26/2011  . Pneumonia    never been in hosp. for pneumonia   . Prediabetes   . Right hand fracture    Monday  . Shortness of breath   . Sleep apnea    doesn't use CPAP any longer, since weight loss   Past Surgical History:  Procedure Laterality Date  . ANTERIOR CERVICAL DECOMP/DISCECTOMY FUSION N/A 07/09/2013   Procedure: ANTERIOR CERVICAL DECOMPRESSION/DISCECTOMY FUSION 3 LEVELS;  Surgeon: Sinclair Ship, MD;  Location: [facility];  Service: Orthopedics;  Laterality: N/A;  Anterior  cervical decompression fusion, cervical 3-4, cervical 4-5-, cervical 5-6 with instrumentation, allograft.  . APPENDECTOMY    . BACK SURGERY     2000  . BREAST EXCISIONAL BIOPSY     breast bxs on left x 2, breast bx of right x 1-all benign  . EYE SURGERY     cataracts bilateral /w IOL  . HERNIA REPAIR     umbilical hernia- 6270  . JOINT REPLACEMENT  2006,2007   bilateral  . LAPAROSCOPIC OVARIAN CYSTECTOMY     not done by laparscopy-abdominal incision  . REPLACEMENT TOTAL KNEE BILATERAL    . TONSILLECTOMY      Allergies  Allergen Reactions  . Clindamycin Hcl Other (See Comments)    REACTION: swelling, pt. Reports that she had a 16 lb. weightgain in one day    Outpatient  Encounter Medications as of 01/10/2019  Medication Sig  . Brexpiprazole 1 MG TABS Take 1 tablet by mouth at bedtime.  . busPIRone (BUSPAR) 15 MG tablet Take 15 mg by mouth daily.  [name] CALCIUM CITRATE PO Take 600 mg by mouth 2 (two) times daily.  . clonazePAM (KLONOPIN) 0.5 MG tablet Take 1 tablet by mouth as needed in middle of the nite to go back to sleep  . CRANBERRY EXTRACT PO Take 2 tablets by mouth daily.  . DULoxetine (CYMBALTA) 60 MG capsule Take 2 capsules (120 mg total) by mouth daily.  [name] ELIQUIS 5 MG TABS tablet TAKE 1 TABLET BY MOUTH TWICE A DAY  . furosemide (LASIX) 40 MG tablet TAKE 1/2 TABLET BY MOUTH EVERY DAY  . levothyroxine (SYNTHROID, LEVOTHROID) 50 MCG tablet TAKE ONE TABLET BY MOUTH ONCE DAILY 30 MINUTES BEFORE BREAKFAST FOR THYROID  . mirabegron ER (MYRBETRIQ) 50 MG TB24 tablet Take 1 tablet (50 mg total) by mouth daily.  . mirtazapine (REMERON) 15 MG tablet Take 7.5 mg by mouth at bedtime.  . Multiple Vitamins-Minerals (MULTIVITAMIN PO) Take 1 tablet by mouth.  . oxyCODONE-acetaminophen (PERCOCET) 10-325 MG tablet Take 1 tab every 4 hrs as needed for moderate pain and 2 tabs every 4 hrs as needed for severe pain  . traZODone (DESYREL) 100 MG tablet Take 2 tablets by mouth at bedtime   No facility-administered encounter medications on file as of 01/10/2019.     Review of Systems  Constitutional: Negative for appetite change, chills, fatigue and fever.  HENT: Positive for hearing loss. Negative for congestion, ear discharge, ear pain, postnasal drip, rhinorrhea, sinus pressure, sinus pain, sneezing, sore throat and tinnitus.        Itchy ears   Eyes: Positive for visual disturbance. Negative for discharge, redness and itching.  Respiratory: Negative for cough, chest tightness, shortness of breath and wheezing.   Cardiovascular: Negative for chest pain, palpitations and leg swelling.  Musculoskeletal: Positive for arthralgias and gait problem.       Left foot pain per HPI    Skin: Negative for color change, pallor, rash and wound.  Neurological: Negative for dizziness, light-headedness and headaches.    Immunization History  Administered Date(s) Administered  . Influenza Split 08/14/2013  . Influenza Whole 08/20/2009  . Influenza, High Dose Seasonal PF 09/11/2017, 07/25/2018  . Influenza,inj,Quad PF,6+ Mos 08/04/2014, 09/06/2015, 07/10/2016  . Pneumococcal Conjugate-13 01/14/2015  . Pneumococcal Polysaccharide-23 07/11/2013  . Td 11/20/2005, 11/27/2016   Pertinent  Health Maintenance Due  Topic Date Due  . OPHTHALMOLOGY EXAM  01/02/2019  . FOOT EXAM  02/15/2019  . HEMOGLOBIN A1C  04/16/2019  . URINE MICROALBUMIN  10/22/2019  . INFLUENZA VACCINE  Completed  . DEXA SCAN  Completed  . PNA vac Low Risk Adult  Completed   Fall Risk  01/10/2019 12/10/2018 10/24/2018 09/16/2018 08/29/2018  Falls in the past year? 1 1 0 Yes No  Number falls in past yr: 0 0 0 2 or more -  Comment - - - - -  Injury with Fall? 0 1 0 Yes -  Risk for fall due to : - - - - -  Follow up - - - - -    Vitals:   01/10/19 1438  BP: 122/78  Pulse: 69  Temp: 98.6 F (37 C)  TempSrc: Oral  SpO2: 93%  Weight: 196 lb 9.6 oz (89.2 kg)  Height: 5\' 1"  (1.549 m)   Body mass index is 37.15 kg/m. Physical Exam Constitutional:      General: She is not in acute distress.    Appearance: She is obese.  HENT:     Head: Normocephalic.     Right Ear: Tympanic membrane, ear canal and external ear normal. There is no impacted cerumen.     Left Ear: Tympanic membrane, ear canal and external ear normal. There is no impacted cerumen.     Ears:     Comments: Wears hearing aids    Nose: Nose normal. No congestion or rhinorrhea.     Mouth/Throat:     Mouth: Mucous membranes are moist.     Pharynx: Oropharynx is clear. No oropharyngeal exudate or posterior oropharyngeal erythema.  Eyes:     General: No scleral icterus.       Right eye: No discharge.        Left eye: No discharge.      Conjunctiva/sclera: Conjunctivae normal.     Pupils: Pupils are equal, round, and reactive to light.  Cardiovascular:     Rate and Rhythm: Normal rate. Rhythm regularly irregular.     Pulses: Normal pulses.     Heart sounds: Normal heart sounds. No murmur. No friction rub. No gallop.   Pulmonary:     Effort: Pulmonary effort is normal. No respiratory distress.     Breath sounds: Normal breath sounds. No wheezing, rhonchi or rales.  Chest:     Chest wall: No tenderness.  Abdominal:     General: Bowel sounds are normal. There is no distension.     Palpations: Abdomen is soft. There is no mass.     Tenderness: There is no abdominal tenderness. There is no right CVA tenderness, left CVA tenderness, guarding or rebound.  Musculoskeletal:        General: No swelling or tenderness.     Right lower leg: No edema.     Left lower leg: No edema.     Comments: Unsteady gait ambulates with a cane.left dorsal foot purple-bluish color tender to touch across foot.  Skin:    General: Skin is warm and dry.     Coloration: Skin is not pale.     Findings: No erythema or rash.  Neurological:     Mental Status: She is alert and oriented to person, place, and time.     Sensory: No sensory deficit.     Motor: No weakness.     Coordination: Coordination normal.     Gait: Gait abnormal.  Psychiatric:        Mood and Affect: Mood normal.        Behavior: Behavior normal.        Thought Content: Thought content  normal.        Judgment: Judgment normal.    Labs reviewed: Recent Labs    06/03/18 0955 10/16/18 0849  NA 137 141  K 3.9 4.0  CL 99 102  CO2 31 30  GLUCOSE 100* 96  BUN 30* 31*  CREATININE 1.08* 0.94*  CALCIUM 9.4 9.2   Recent Labs    06/03/18 0955 10/16/18 0849  AST 15 17  ALT 7 8  BILITOT 0.5 0.4  PROT 6.5 6.3   Recent Labs    07/04/18 1652 09/06/18 1110 10/16/18 0849  WBC 6.3 8.2 5.1  NEUTROABS 3,679 5,863 3,366  HGB 13.1 13.8 13.2  HCT 38.7 41.3 39.5  MCV 89.2  89.2 89.0  PLT 254 253 221   Lab Results  Component Value Date   TSH 2.00 10/16/2018   Lab Results  Component Value Date   HGBA1C 5.1 10/16/2018   Lab Results  Component Value Date   CHOL 172 10/16/2018   HDL 56 10/16/2018   LDLCALC 97 10/16/2018   TRIG 98 10/16/2018   CHOLHDL 3.1 10/16/2018    Significant Diagnostic Results in last 30 days:  No results found.  Assessment/Plan 1. Acute foot pain, left Afebrile.tender to palpation across dorsal foot.  - DG Foot Complete Left; Future - continue on percocet as needed for pain.  Family/ staff Communication: Reviewed plan of care with patient and daughter.  Labs/tests ordered: - DG Foot Complete Left; Future  Sandrea Hughs, NP

## 2019-01-10 NOTE — Patient Instructions (Signed)
Foot Pain  Many things can cause foot pain. Some common causes are:   An injury.   A sprain.   Arthritis.   Blisters.   Bunions.  Follow these instructions at home:  Pay attention to any changes in your symptoms. Take these actions to help with your discomfort:   If directed, put ice on the affected area:  ? Put ice in a plastic bag.  ? Place a towel between your skin and the bag.  ? Leave the ice on for 15-20 minutes, 3?4 times a day for 2 days.   Take over-the-counter and prescription medicines only as told by your health care provider.   Wear comfortable, supportive shoes that fit you well. Do not wear high heels.   Do not stand or walk for long periods of time.   Do not lift a lot of weight. This can put added pressure on your feet.   Do stretches to relieve foot pain and stiffness as told by your health care provider.   Rub your foot gently.   Keep your feet clean and dry.  Contact a health care provider if:   Your pain does not get better after a few days of self-care.   Your pain gets worse.   You cannot stand on your foot.  Get help right away if:   Your foot is numb or tingling.   Your foot or toes are swollen.   Your foot or toes turn white or blue.   You have warmth and redness along your foot.  This information is not intended to replace advice given to you by your health care provider. Make sure you discuss any questions you have with your health care provider.  Document Released: 12/03/2015 Document Revised: 04/13/2016 Document Reviewed: 12/02/2014  Elsevier Interactive Patient Education  2019 [employer]

## 2019-01-13 DIAGNOSIS — M6281 Muscle weakness (generalized): Secondary | ICD-10-CM | POA: Diagnosis not present

## 2019-01-13 DIAGNOSIS — R260 Ataxic gait: Secondary | ICD-10-CM | POA: Diagnosis not present

## 2019-01-22 DIAGNOSIS — R260 Ataxic gait: Secondary | ICD-10-CM | POA: Diagnosis not present

## 2019-01-22 DIAGNOSIS — M6281 Muscle weakness (generalized): Secondary | ICD-10-CM | POA: Diagnosis not present

## 2019-02-06 DIAGNOSIS — F341 Dysthymic disorder: Secondary | ICD-10-CM | POA: Diagnosis not present

## 2019-02-06 DIAGNOSIS — F331 Major depressive disorder, recurrent, moderate: Secondary | ICD-10-CM | POA: Diagnosis not present

## 2019-02-06 DIAGNOSIS — F411 Generalized anxiety disorder: Secondary | ICD-10-CM | POA: Diagnosis not present

## 2019-02-12 ENCOUNTER — Other Ambulatory Visit: Payer: Self-pay | Admitting: *Deleted

## 2019-02-12 DIAGNOSIS — G894 Chronic pain syndrome: Secondary | ICD-10-CM

## 2019-02-12 MED ORDER — OXYCODONE-ACETAMINOPHEN 10-325 MG PO TABS: ORAL_TABLET | ORAL | 0 refills | Status: DC

## 2019-02-12 NOTE — Telephone Encounter (Signed)
[name] requested refill [facility] Database Verified LR: 12/10/2018 Pended Rx and sent to Dr. Mariea Clonts for approval.

## 2019-02-19 DIAGNOSIS — C801 Malignant (primary) neoplasm, unspecified: Secondary | ICD-10-CM

## 2019-02-19 HISTORY — DX: Malignant (primary) neoplasm, unspecified: C80.1

## 2019-02-20 ENCOUNTER — Other Ambulatory Visit: Payer: Self-pay | Admitting: Nurse Practitioner

## 2019-02-20 ENCOUNTER — Other Ambulatory Visit: Payer: Medicare Other

## 2019-02-20 ENCOUNTER — Encounter: Payer: Self-pay | Admitting: Nurse Practitioner

## 2019-02-20 ENCOUNTER — Other Ambulatory Visit: Payer: Self-pay

## 2019-02-20 ENCOUNTER — Ambulatory Visit (INDEPENDENT_AMBULATORY_CARE_PROVIDER_SITE_OTHER): Payer: Medicare Other | Admitting: Nurse Practitioner

## 2019-02-20 DIAGNOSIS — R0981 Nasal congestion: Secondary | ICD-10-CM | POA: Diagnosis not present

## 2019-02-20 DIAGNOSIS — I480 Paroxysmal atrial fibrillation: Secondary | ICD-10-CM

## 2019-02-20 DIAGNOSIS — E039 Hypothyroidism, unspecified: Secondary | ICD-10-CM | POA: Diagnosis not present

## 2019-02-20 DIAGNOSIS — N644 Mastodynia: Secondary | ICD-10-CM | POA: Diagnosis not present

## 2019-02-20 DIAGNOSIS — R739 Hyperglycemia, unspecified: Secondary | ICD-10-CM

## 2019-02-20 LAB — BASIC METABOLIC PANEL
BUN/Creatinine Ratio: 23 (calc) — ABNORMAL HIGH (ref 6–22)
BUN: 22 mg/dL (ref 7–25)
CO2: 31 mmol/L (ref 20–32)
Calcium: 9.4 mg/dL (ref 8.6–10.4)
Chloride: 102 mmol/L (ref 98–110)
Creat: 0.95 mg/dL — ABNORMAL HIGH (ref 0.60–0.88)
Glucose, Bld: 109 mg/dL — ABNORMAL HIGH (ref 65–99)
Potassium: 3.8 mmol/L (ref 3.5–5.3)
Sodium: 140 mmol/L (ref 135–146)

## 2019-02-20 LAB — CBC WITH DIFFERENTIAL/PLATELET
Absolute Monocytes: 370 cells/uL (ref 200–950)
Basophils Absolute: 39 cells/uL (ref 0–200)
Basophils Relative: 0.7 %
Eosinophils Absolute: 90 cells/uL (ref 15–500)
Eosinophils Relative: 1.6 %
HCT: 34.9 % — ABNORMAL LOW (ref 35.0–45.0)
Hemoglobin: 11.5 g/dL — ABNORMAL LOW (ref 11.7–15.5)
Lymphs Abs: 1305 cells/uL (ref 850–3900)
MCH: 28.3 pg (ref 27.0–33.0)
MCHC: 33 g/dL (ref 32.0–36.0)
MCV: 85.7 fL (ref 80.0–100.0)
MPV: 10 fL (ref 7.5–12.5)
Monocytes Relative: 6.6 %
Neutro Abs: 3797 cells/uL (ref 1500–7800)
Neutrophils Relative %: 67.8 %
Platelets: 270 10*3/uL (ref 140–400)
RBC: 4.07 10*6/uL (ref 3.80–5.10)
RDW: 12.4 % (ref 11.0–15.0)
Total Lymphocyte: 23.3 %
WBC: 5.6 10*3/uL (ref 3.8–10.8)

## 2019-02-20 LAB — TSH: TSH: 2.27 mIU/L (ref 0.40–4.50)

## 2019-02-20 NOTE — Progress Notes (Signed)
This service is provided via telemedicine  No vital signs collected/recorded due to the encounter was a telemedicine visit.   Location of patient (ex: home, work):  Home  Patient consents to a telephone visit: Yes  Location of the provider (ex: office, home):  Home  Name of any referring provider:  Hollace Kinnier, DO   Names of all persons participating in the telemedicine service and their role in the encounter:  S.Chrae B/CMA, [name], NP, and Tammi Klippel (daughter) andPatient   Time spent on call: 6 min with CMA    Virtual Visit via Telephone Note  I connected with Andrea [name] on 02/20/19 at 11:30 AM EDT by telephone and verified that I am speaking with the correct person using two identifiers.   I discussed the limitations, risks, security and privacy concerns of performing an evaluation and management service by telephone and the availability of in person appointments. I also discussed with the patient that there may be a patient responsible charge related to this service. The patient expressed understanding and agreed to proceed.      Careteam: Patient Care Team: Gayland Curry, DO as PCP - General (Geriatric Medicine) Tanda Rockers, MD as Consulting Physician (Pulmonary Disease) Deveron Furlong, NP as Nurse Practitioner (Psychiatry)  Advanced Directive information    Allergies  Allergen Reactions  . Clindamycin Hcl Other (See Comments)    REACTION: swelling, pt. Reports that she had a 16 lb. weightgain in one day    Chief Complaint  Patient presents with  . Acute Visit    Patient c/o left breast pain x 4 weeks and questions if she needs to have a diagnostic mammogram   . URI    SOB and nasal congestion, clear     HPI: Patient is a 84 y.o. female who is coming of left breast that has been worrying her. Pain in breast has been going on for 4 weeks and remains abt the same. Pain comes and goes and when it is bad is it 7/10.  Sharp pain. Will take her  oxycodone for it but does not really help it.  Pain is around the nipple. Has pain every days. Comes on about 4 times daily lasting from a few mins to a few hours.  Wearing a bra makes pain worse. Nothing seems to help pain.  No lumps/bump/redness(or color changes)/warmth. Noted drainage from the left nipple that was white and lasted a few days and has not reoccurred.  No rash.  No longer getting mammogram. Has not had one since she has lived in [place] (been here for 8 years)  Last night noted nasal congestion but better today.  No fever or chills.  No new body aches.  No cough, congestion or shortness of breath.  No sneezing, itchy or watery eyes.   Review of Systems:  Review of Systems  Constitutional: Negative for chills, fever and malaise/fatigue.  HENT: Positive for hearing loss. Negative for congestion.   Eyes: Negative for blurred vision.       Glasses  Respiratory: Negative for shortness of breath.   Cardiovascular: Negative for chest pain, palpitations and leg swelling.  Gastrointestinal: Negative for abdominal pain, blood in stool, constipation, diarrhea and melena.  Musculoskeletal: Negative for joint pain.  Skin: Negative for itching and rash.  Neurological: Positive for weakness. Negative for dizziness.       Unsteady gait  Endo/Heme/Allergies: Bruises/bleeds easily.  Psychiatric/Behavioral: Positive for memory loss. Negative for depression. The patient is not nervous/anxious and does  not have insomnia.     Past Medical History:  Diagnosis Date  . Anemia   . Anxiety   . Arthritis    hands, spine   . Blood transfusion   . Cervicalgia   . Constipation   . Cough    Wert-onset 08/2009, as of 2014- resolved   . Deep venous thrombosis ([place])    post op, rec'd/needed  blood thinner   . Depression   . Depression with anxiety   . Dysrhythmia   . Falls frequently    pt. reports that she was falling 3 times a day, has had PT at a facility for a while & now is getting home  PT 2-3 times/ week   . Headache(784.0) 09/27/2011  . History of stress test    15-20 yrs.ago   . Hypotension   . Hypothyroidism   . Lumbar radiculopathy 09/26/2011  . Pneumonia    never been in hosp. for pneumonia   . Prediabetes   . Right hand fracture    Monday  . Shortness of breath   . Sleep apnea    doesn't use CPAP any longer, since weight loss   Past Surgical History:  Procedure Laterality Date  . ANTERIOR CERVICAL DECOMP/DISCECTOMY FUSION N/A 07/09/2013   Procedure: ANTERIOR CERVICAL DECOMPRESSION/DISCECTOMY FUSION 3 LEVELS;  Surgeon: Sinclair Ship, MD;  Location: [facility];  Service: Orthopedics;  Laterality: N/A;  Anterior cervical decompression fusion, cervical 3-4, cervical 4-5-, cervical 5-6 with instrumentation, allograft.  . APPENDECTOMY    . BACK SURGERY     2000  . BREAST EXCISIONAL BIOPSY     breast bxs on left x 2, breast bx of right x 1-all benign  . EYE SURGERY     cataracts bilateral /w IOL  . HERNIA REPAIR     umbilical hernia- [id number]  . JOINT REPLACEMENT  2006,2007   bilateral  . LAPAROSCOPIC OVARIAN CYSTECTOMY     not done by laparscopy-abdominal incision  . REPLACEMENT TOTAL KNEE BILATERAL    . TONSILLECTOMY     Social History:   reports that she has been smoking cigarettes. She has smoked for the past 20.00 years. She has never used smokeless tobacco. She reports current alcohol use of about 1.0 standard drinks of alcohol per week. She reports that she does not use drugs.  Family History  Problem Relation Age of Onset  . Heart disease Father   . Malignant hyperthermia Father   . Arthritis Father   . Deep vein thrombosis Son   . Diabetes Son   . Obesity Son   . Depression Son   . Hypertension Son   . Parkinson's disease Mother   . Diabetes Daughter   . Hypertension Daughter   . Anxiety disorder Daughter   . Hyperlipidemia Son   . Hypertension Son   . Cancer Maternal Aunt     Medications: Patient's Medications  New Prescriptions    No medications on file  Previous Medications   BREXPIPRAZOLE 1 MG TABS    Take 1 tablet by mouth at bedtime.   BUSPIRONE (BUSPAR) 15 MG TABLET    Take 15 mg by mouth daily.   CALCIUM CITRATE PO    Take 600 mg by mouth 2 (two) times daily.   CLONAZEPAM (KLONOPIN) 0.5 MG TABLET    Take 1 tablet by mouth as needed in middle of the nite to go back to sleep   CRANBERRY EXTRACT PO    Take 2 tablets by mouth daily.  DULOXETINE (CYMBALTA) 60 MG CAPSULE    Take 2 capsules (120 mg total) by mouth daily.   ELIQUIS 5 MG TABS TABLET    TAKE 1 TABLET BY MOUTH TWICE A DAY   FUROSEMIDE (LASIX) 40 MG TABLET    TAKE 1/2 TABLET BY MOUTH EVERY DAY   LEVOTHYROXINE (SYNTHROID, LEVOTHROID) 50 MCG TABLET    TAKE ONE TABLET BY MOUTH ONCE DAILY 30 MINUTES BEFORE BREAKFAST FOR THYROID   MIRABEGRON ER (MYRBETRIQ) 50 MG TB24 TABLET    Take 1 tablet (50 mg total) by mouth daily.   MIRTAZAPINE (REMERON) 15 MG TABLET    Take 7.5 mg by mouth at bedtime.   MULTIPLE VITAMINS-MINERALS (MULTIVITAMIN PO)    Take 1 tablet by mouth.   OXYCODONE-ACETAMINOPHEN (PERCOCET) 10-325 MG TABLET    Take 1 tab every 4 hrs as needed for moderate pain and 2 tabs every 4 hrs as needed for severe pain   TRAZODONE (DESYREL) 100 MG TABLET    Take 2 tablets by mouth at bedtime  Modified Medications   No medications on file  Discontinued Medications   No medications on file     Physical Exam: Unable due to tele-visit.   Labs reviewed: Basic Metabolic Panel: Recent Labs    06/03/18 0955 10/16/18 0849  NA 137 141  K 3.9 4.0  CL 99 102  CO2 31 30  GLUCOSE 100* 96  BUN 30* 31*  CREATININE 1.08* 0.94*  CALCIUM 9.4 9.2  TSH 1.23 2.00   Liver Function Tests: Recent Labs    06/03/18 0955 10/16/18 0849  AST 15 17  ALT 7 8  BILITOT 0.5 0.4  PROT 6.5 6.3   No results for input(s): LIPASE, AMYLASE in the last 8760 hours. No results for input(s): AMMONIA in the last 8760 hours. CBC: Recent Labs    07/04/18 1652 09/06/18 1110  10/16/18 0849  WBC 6.3 8.2 5.1  NEUTROABS 3,679 5,863 3,366  HGB 13.1 13.8 13.2  HCT 38.7 41.3 39.5  MCV 89.2 89.2 89.0  PLT 254 253 221   Lipid Panel: Recent Labs    10/16/18 0849  CHOL 172  HDL 56  LDLCALC 97  TRIG 98  CHOLHDL 3.1   TSH: Recent Labs    06/03/18 0955 10/16/18 0849  TSH 1.23 2.00   A1C: Lab Results  Component Value Date   HGBA1C 5.1 10/16/2018     Assessment/Plan 1. Breast pain, left Pain noted to left breast that comes and goes. No rash, lump, bump or color change - MM Digital Diagnostic Unilat L; Future for further evaluation. -to notify if pain worsens or symptoms change.    2. Nasal congestion Improved at this time.   Next appt: 1 week with Dr Mariea Clonts via tele-visit. Carlos American. Harle Battiest  [facility] [phone]    Follow Up Instructions:    I discussed the assessment and treatment plan with the patient. The patient was provided an opportunity to ask questions and all were answered. The patient agreed with the plan and demonstrated an understanding of the instructions.   The patient was advised to call back or seek an in-person evaluation if the symptoms worsen or if the condition fails to improve as anticipated.  I provided 14 minutes of non-face-to-face time during this encounter.   Lauree Chandler, NP  avs printed and mailed

## 2019-02-27 ENCOUNTER — Encounter: Payer: Self-pay | Admitting: Internal Medicine

## 2019-02-27 ENCOUNTER — Other Ambulatory Visit: Payer: Self-pay

## 2019-02-27 ENCOUNTER — Ambulatory Visit (INDEPENDENT_AMBULATORY_CARE_PROVIDER_SITE_OTHER): Payer: Medicare Other | Admitting: Internal Medicine

## 2019-02-27 DIAGNOSIS — I480 Paroxysmal atrial fibrillation: Secondary | ICD-10-CM

## 2019-02-27 DIAGNOSIS — N644 Mastodynia: Secondary | ICD-10-CM

## 2019-02-27 DIAGNOSIS — D649 Anemia, unspecified: Secondary | ICD-10-CM | POA: Diagnosis not present

## 2019-02-27 DIAGNOSIS — E039 Hypothyroidism, unspecified: Secondary | ICD-10-CM

## 2019-02-27 DIAGNOSIS — N3281 Overactive bladder: Secondary | ICD-10-CM

## 2019-02-27 DIAGNOSIS — G894 Chronic pain syndrome: Secondary | ICD-10-CM

## 2019-02-27 DIAGNOSIS — Z7189 Other specified counseling: Secondary | ICD-10-CM

## 2019-02-27 NOTE — Patient Instructions (Addendum)
Continue to hold your lasix.  Monitor for shortness of breath, swelling or weight gain.  Restart if this occurs and let us know.  Hold your myrbetriq and monitor your urinary frequency and incontinence.  If it's not any worse, stay off the myrbetriq.  If it's notably worse, you may restart the myrbetriq.  Let us know what happens.    Please stop smoking again!  I discovered you had restarted when I was reviewing your chart more after our televisit.    I'll be on the lookout for the results of your mammogram.  Please wear a mask when you go out, follow social distancing and disinfect things you touch after your appointment and wash your hands well when you return home.

## 2019-02-27 NOTE — Progress Notes (Signed)
Patient ID: Andrea [name], female   DOB: 21-Jun-1935, 84 y.o.   MRN: [id number] This service is provided via telemedicine  No vital signs collected/recorded due to the encounter was a telemedicine visit.   Location of patient (ex: home, work):  HOME  Patient consents to a telephone visit:  YES  Location of the provider (ex: office, home): OFFICE  Name of any referring provider:  Kathey Simer, DO  Names of all persons participating in the telemedicine service and their role in the encounter:  Kellsey Ballinas, [name], [place], [place], DO  Time spent on call:  8:39    Provider:  Epiphany Seltzer L. Mariea Clonts, D.O., C.M.D.  Code Status: DNR Goals of Care: REviewed today in terms of covid-19--see vynca Advanced Directives 12/10/2018  Does Patient Have a Medical Advance Directive? Yes  Type of Advance Directive Out of facility DNR (pink MOST or yellow form)  Does patient want to make changes to medical advance directive? No - Patient declined  Copy of [place] in Chart? Yes - validated most recent copy scanned in chart (See row information)  Would patient like information on creating a medical advance directive? -  Pre-existing out of facility DNR order (yellow form or pink MOST form) -     Chief Complaint  Patient presents with  . telephone visit    3mth follow-up    HPI: Patient is a 84 y.o. female seen today for medical management of chronic diseases.    She's a little tired of being in the house.  Her daughter is there on the phone.    Her hemoglobin had dropped from last time.  It's been running in the 12-13 range.  She's not seen blood or dark stool.   She's been having problems with her left breast for over a month.  It's been painful off and on a few hours per day.  Sometimes 30 mins, sometimes 10 minutes.  She has a mammogram scheduled.    She had been having trouble with urgency and incontinence.  They have held her lasix.  She is short of breath a lot  of times.  Not worse.  She's able to walk a couple of blocks w/o trouble.  They are going to watch her legs for swelling.  They were having to change all her clothes 4 times a day.  She is still taking the myrbetriq.  Bladder control is a little better w/o the lasix.  Her daughter started her on both iron and b12.    Not having much pain.  Takes her pain pill about daily.   Sleeping ok at night.   Bowels not moving great--is using her prune juice, miralax sometimes--about every other day.  They've added more fiber.    We reviewed her goals of care in the instance of covid-19 and I documented this in vynca.  Past Medical History:  Diagnosis Date  . Anemia   . Anxiety   . Arthritis    hands, spine   . Blood transfusion   . Cervicalgia   . Constipation   . Cough    Wert-onset 08/2009, as of 2014- resolved   . Deep venous thrombosis ([place])    post op, rec'd/needed  blood thinner   . Depression   . Depression with anxiety   . Dysrhythmia   . Falls frequently    pt. reports that she was falling 3 times a day, has had PT at a facility for a while & now  is getting home PT 2-3 times/ week   . Headache(784.0) 09/27/2011  . History of stress test    15-20 yrs.ago   . Hypotension   . Hypothyroidism   . Lumbar radiculopathy 09/26/2011  . Pneumonia    never been in hosp. for pneumonia   . Prediabetes   . Right hand fracture    Monday  . Shortness of breath   . Sleep apnea    doesn't use CPAP any longer, since weight loss    Past Surgical History:  Procedure Laterality Date  . ANTERIOR CERVICAL DECOMP/DISCECTOMY FUSION N/A 07/09/2013   Procedure: ANTERIOR CERVICAL DECOMPRESSION/DISCECTOMY FUSION 3 LEVELS;  Surgeon: Sinclair Ship, MD;  Location: [facility];  Service: Orthopedics;  Laterality: N/A;  Anterior cervical decompression fusion, cervical 3-4, cervical 4-5-, cervical 5-6 with instrumentation, allograft.  . APPENDECTOMY    . BACK SURGERY     2000  . BREAST EXCISIONAL BIOPSY      breast bxs on left x 2, breast bx of right x 1-all benign  . EYE SURGERY     cataracts bilateral /w IOL  . HERNIA REPAIR     umbilical hernia- 6256  . JOINT REPLACEMENT  2006,2007   bilateral  . LAPAROSCOPIC OVARIAN CYSTECTOMY     not done by laparscopy-abdominal incision  . REPLACEMENT TOTAL KNEE BILATERAL    . TONSILLECTOMY      Allergies  Allergen Reactions  . Clindamycin Hcl Other (See Comments)    REACTION: swelling, pt. Reports that she had a 16 lb. weightgain in one day    Outpatient Encounter Medications as of 02/27/2019  Medication Sig  . Brexpiprazole 1 MG TABS Take 1 tablet by mouth at bedtime.  [name] CALCIUM CITRATE PO Take 600 mg by mouth 2 (two) times daily.  . clonazePAM (KLONOPIN) 0.5 MG tablet Take 1 tablet by mouth as needed in middle of the nite to go back to sleep  . CRANBERRY EXTRACT PO Take 2 tablets by mouth daily.  . DULoxetine (CYMBALTA) 60 MG capsule Take 2 capsules (120 mg total) by mouth daily.  [name] ELIQUIS 5 MG TABS tablet TAKE 1 TABLET BY MOUTH TWICE A DAY  . levothyroxine (SYNTHROID, LEVOTHROID) 50 MCG tablet TAKE ONE TABLET BY MOUTH ONCE DAILY 30 MINUTES BEFORE BREAKFAST FOR THYROID  . mirabegron ER (MYRBETRIQ) 50 MG TB24 tablet Take 1 tablet (50 mg total) by mouth daily.  . mirtazapine (REMERON) 15 MG tablet Take 7.5 mg by mouth at bedtime.  . Multiple Vitamins-Minerals (MULTIVITAMIN PO) Take 1 tablet by mouth.  . oxyCODONE-acetaminophen (PERCOCET) 10-325 MG tablet Take 1 tab every 4 hrs as needed for moderate pain and 2 tabs every 4 hrs as needed for severe pain  . traZODone (DESYREL) 100 MG tablet Take 2 tablets by mouth at bedtime  . furosemide (LASIX) 40 MG tablet TAKE 1/2 TABLET BY MOUTH EVERY DAY  . [DISCONTINUED] busPIRone (BUSPAR) 15 MG tablet Take 15 mg by mouth daily.   No facility-administered encounter medications on file as of 02/27/2019.     Review of Systems:  Review of Systems  Constitutional: Negative for chills, fever and  malaise/fatigue.  HENT: Positive for hearing loss.   Respiratory: Negative for cough and shortness of breath.   Cardiovascular: Negative for chest pain.       Breast pain (HPI)  Gastrointestinal: Positive for constipation. Negative for abdominal pain.  Genitourinary: Positive for frequency and urgency. Negative for dysuria.       Overactive bladder  Musculoskeletal:  Positive for back pain and joint pain. Negative for falls.  Skin: Negative for itching and rash.  Neurological: Negative for dizziness and loss of consciousness.  Endo/Heme/Allergies: Bruises/bleeds easily.  Psychiatric/Behavioral:       Bipolar disorder    Health Maintenance  Topic Date Due  . OPHTHALMOLOGY EXAM  01/02/2019  . FOOT EXAM  02/15/2019  . HEMOGLOBIN A1C  04/16/2019  . INFLUENZA VACCINE  06/21/2019  . URINE MICROALBUMIN  10/22/2019  . TETANUS/TDAP  11/27/2026  . DEXA SCAN  Completed  . PNA vac Low Risk Adult  Completed    Physical Exam: Could not be performed as visit non face-to-face via phone   Labs reviewed: Basic Metabolic Panel: Recent Labs    06/03/18 0955 10/16/18 0849 02/20/19 0843  NA 137 141 140  K 3.9 4.0 3.8  CL 99 102 102  CO2 31 30 31   GLUCOSE 100* 96 109*  BUN 30* 31* 22  CREATININE 1.08* 0.94* 0.95*  CALCIUM 9.4 9.2 9.4  TSH 1.23 2.00 2.27   Liver Function Tests: Recent Labs    06/03/18 0955 10/16/18 0849  AST 15 17  ALT 7 8  BILITOT 0.5 0.4  PROT 6.5 6.3   No results for input(s): LIPASE, AMYLASE in the last 8760 hours. No results for input(s): AMMONIA in the last 8760 hours. CBC: Recent Labs    09/06/18 1110 10/16/18 0849 02/20/19 0843  WBC 8.2 5.1 5.6  NEUTROABS 5,863 3,366 3,797  HGB 13.8 13.2 11.5*  HCT 41.3 39.5 34.9*  MCV 89.2 89.0 85.7  PLT 253 221 270   Lipid Panel: Recent Labs    10/16/18 0849  CHOL 172  HDL 56  LDLCALC 97  TRIG 98  CHOLHDL 3.1   Lab Results  Component Value Date   HGBA1C 5.1 10/16/2018    Assessment/Plan 1.  Normocytic anemia -h/h lower than it's been -on eliquis -no known bleeding -ok to take otc iron and b12 until 4 mo f/u when we'll recheck  2. Breast pain, left -new onset, has mammogram ordered by NP scheduled  3. Chronic pain syndrome -stable with current regimen, continue and monitor  4. Overactive bladder -hold myrbetriq and see if she notices a difference--I'd suggested this before but it appears it was not done -her daughter is with her now and will ensure this happens  5. Paroxysmal A-fib (HCC) -cont eliquis -monitor h/h  6. Adult hypothyroidism -cont levothyroxine, tsh at goal  7. ACP (advance care planning) -discussed goals of care in context of covid-19 -see vynca -16 minutes spent on this portion  Labs/tests ordered:   Orders Placed This Encounter  Procedures  . CBC with Differential/Platelet    Standing Status:   Future    Standing Expiration Date:   02/27/2020  . Basic metabolic panel    Standing Status:   Future    Standing Expiration Date:   02/27/2020    Order Specific Question:   Has the patient fasted?    Answer:   Yes  . Vitamin B12    Standing Status:   Future    Standing Expiration Date:   02/27/2020  . Iron, TIBC and Ferritin Panel    Standing Status:   Future    Standing Expiration Date:   02/27/2020   Next appt: 07/03/2019  Non face-to-face time spent on televisit:  26 minutes  Sherise Geerdes L. Chanse Kagel, D.O. [facility] [address] [place] [address] Cell Phone (Mon-Fri 8am-5pm):  [phone] On Call:  [phone] & follow prompts after 5pm & weekends Office Phone:  [phone] Office Fax:  [phone]

## 2019-03-03 ENCOUNTER — Ambulatory Visit
Admission: RE | Admit: 2019-03-03 | Discharge: 2019-03-03 | Disposition: A | Discharge status: Home / Self Care | Payer: Medicare Other | Source: Ambulatory Visit | Attending: Nurse Practitioner | Admitting: Nurse Practitioner

## 2019-03-03 ENCOUNTER — Other Ambulatory Visit: Payer: Self-pay

## 2019-03-03 ENCOUNTER — Other Ambulatory Visit: Payer: Self-pay | Admitting: Nurse Practitioner

## 2019-03-03 DIAGNOSIS — C50412 Malignant neoplasm of upper-outer quadrant of left female breast: Secondary | ICD-10-CM | POA: Diagnosis not present

## 2019-03-03 DIAGNOSIS — N644 Mastodynia: Secondary | ICD-10-CM

## 2019-03-03 DIAGNOSIS — N6321 Unspecified lump in the left breast, upper outer quadrant: Secondary | ICD-10-CM | POA: Diagnosis not present

## 2019-03-03 DIAGNOSIS — Z170 Estrogen receptor positive status [ER+]: Secondary | ICD-10-CM | POA: Diagnosis not present

## 2019-03-03 DIAGNOSIS — R928 Other abnormal and inconclusive findings on diagnostic imaging of breast: Secondary | ICD-10-CM

## 2019-03-03 DIAGNOSIS — N6320 Unspecified lump in the left breast, unspecified quadrant: Secondary | ICD-10-CM

## 2019-03-04 ENCOUNTER — Encounter: Payer: Self-pay | Admitting: Nurse Practitioner

## 2019-03-04 ENCOUNTER — Ambulatory Visit: Payer: Medicare Other | Admitting: Nurse Practitioner

## 2019-03-04 DIAGNOSIS — D0512 Intraductal carcinoma in situ of left breast: Secondary | ICD-10-CM

## 2019-03-04 NOTE — Progress Notes (Addendum)
This service is provided via telemedicine  No vital signs collected/recorded due to the encounter was a telemedicine visit.   Location of patient (ex: home, work):  Home  Patient consents to a telephone visit: Yes  Location of the provider (ex: office, home): [employer], Office   Name of any referring provider:  Gayland Curry, DO  Names of all persons participating in the telemedicine service and their role in the encounter:  S.Chrae B/CMA, [name], NP, and Patient   Time spent on call:  3 min with medical assistant   Virtual Visit via Telephone Note  I connected with Andrea [name] on 03/04/19 at 12:00 PM EDT by telephone and verified that I am speaking with the correct person using two identifiers.   I discussed the limitations, risks, security and privacy concerns of performing an evaluation and management service by telephone and the availability of in person appointments. I also discussed with the patient that there may be a patient responsible charge related to this service. The patient expressed understanding and agreed to proceed.      Careteam: Patient Care Team: Gayland Curry, DO as PCP - General (Geriatric Medicine) Tanda Rockers, MD as Consulting Physician (Pulmonary Disease) Deveron Furlong, NP as Nurse Practitioner (Psychiatry)  Advanced Directive information    Allergies  Allergen Reactions  . Clindamycin Hcl Other (See Comments)    REACTION: swelling, pt. Reports that she had a 16 lb. weightgain in one day    Chief Complaint  Patient presents with  . Follow-up    Discuss Breast Biopsy results      HPI: Patient is a 84 y.o. female with painful breast mass noted by pt with discharge and was referred [facility] imaging for further evaluation. Ultrasound of the breast noted a 1.7 cm left breast mass associated with architectural distortion and nipple retraction. This is highly suspicious for breast carcinoma. No left axillary  lymphadenopathy. No evidence of right breast malignancy. Recommended biopsy which was done at the time of Ultrasound.   Biopsy results from 03/03/2019 reveal invasive ductal carcinoma, ductal carcinoma in situ and the carcinoma appears grade 3.   Review of Systems:  Review of Systems  Constitutional: Negative for chills and fever.  Respiratory: Negative for cough and shortness of breath.   Cardiovascular: Negative for chest pain and leg swelling.  Skin:       Left breast pain (per HPI)  Psychiatric/Behavioral: Negative for depression and memory loss. The patient is not nervous/anxious and does not have insomnia.     Past Medical History:  Diagnosis Date  . Anemia   . Anxiety   . Arthritis    hands, spine   . Blood transfusion   . Cervicalgia   . Constipation   . Cough    Wert-onset 08/2009, as of 2014- resolved   . Deep venous thrombosis ([place])    post op, rec'd/needed  blood thinner   . Depression   . Depression with anxiety   . Dysrhythmia   . Falls frequently    pt. reports that she was falling 3 times a day, has had PT at a facility for a while & now is getting home PT 2-3 times/ week   . Headache(784.0) 09/27/2011  . History of stress test    15-20 yrs.ago   . Hypotension   . Hypothyroidism   . Lumbar radiculopathy 09/26/2011  . Pneumonia    never been in hosp. for pneumonia   . Prediabetes   .  Right hand fracture    Monday  . Shortness of breath   . Sleep apnea    doesn't use CPAP any longer, since weight loss   Past Surgical History:  Procedure Laterality Date  . ANTERIOR CERVICAL DECOMP/DISCECTOMY FUSION N/A 07/09/2013   Procedure: ANTERIOR CERVICAL DECOMPRESSION/DISCECTOMY FUSION 3 LEVELS;  Surgeon: Sinclair Ship, MD;  Location: [place];  Service: Orthopedics;  Laterality: N/A;  Anterior cervical decompression fusion, cervical 3-4, cervical 4-5-, cervical 5-6 with instrumentation, allograft.  . APPENDECTOMY    . BACK SURGERY     2000  . BREAST  EXCISIONAL BIOPSY     breast bxs on left x 2, breast bx of right x 1-all benign  . EYE SURGERY     cataracts bilateral /w IOL  . HERNIA REPAIR     umbilical hernia- 6720  . JOINT REPLACEMENT  2006,2007   bilateral  . LAPAROSCOPIC OVARIAN CYSTECTOMY     not done by laparscopy-abdominal incision  . REPLACEMENT TOTAL KNEE BILATERAL    . TONSILLECTOMY     Social History:   reports that she has been smoking cigarettes. She has smoked for the past 20.00 years. She has never used smokeless tobacco. She reports current alcohol use of about 1.0 standard drinks of alcohol per week. She reports that she does not use drugs.  Family History  Problem Relation Age of Onset  . Heart disease Father   . Malignant hyperthermia Father   . Arthritis Father   . Deep vein thrombosis Son   . Diabetes Son   . Obesity Son   . Depression Son   . Hypertension Son   . Parkinson's disease Mother   . Diabetes Daughter   . Hypertension Daughter   . Anxiety disorder Daughter   . Hyperlipidemia Son   . Hypertension Son   . Cancer Maternal Aunt     Medications: Patient's Medications  New Prescriptions   No medications on file  Previous Medications   BREXPIPRAZOLE 1 MG TABS    Take 1 tablet by mouth at bedtime.   CALCIUM CITRATE PO    Take 600 mg by mouth 2 (two) times daily.   CLONAZEPAM (KLONOPIN) 0.5 MG TABLET    Take 1 tablet by mouth as needed in middle of the nite to go back to sleep   CRANBERRY EXTRACT PO    Take 2 tablets by mouth daily.   DULOXETINE (CYMBALTA) 60 MG CAPSULE    Take 2 capsules (120 mg total) by mouth daily.   ELIQUIS 5 MG TABS TABLET    TAKE 1 TABLET BY MOUTH TWICE A DAY   FUROSEMIDE (LASIX) 40 MG TABLET    TAKE 1/2 TABLET BY MOUTH EVERY DAY   LEVOTHYROXINE (SYNTHROID, LEVOTHROID) 50 MCG TABLET    TAKE ONE TABLET BY MOUTH ONCE DAILY 30 MINUTES BEFORE BREAKFAST FOR THYROID   MIRABEGRON ER (MYRBETRIQ) 50 MG TB24 TABLET    Take 1 tablet (50 mg total) by mouth daily.   MIRTAZAPINE  (REMERON) 15 MG TABLET    Take 7.5 mg by mouth at bedtime.   MULTIPLE VITAMINS-MINERALS (MULTIVITAMIN PO)    Take 1 tablet by mouth.   OXYCODONE-ACETAMINOPHEN (PERCOCET) 10-325 MG TABLET    Take 1 tab every 4 hrs as needed for moderate pain and 2 tabs every 4 hrs as needed for severe pain   TRAZODONE (DESYREL) 100 MG TABLET    Take 2 tablets by mouth at bedtime  Modified Medications   No medications on  file  Discontinued Medications   No medications on file     Physical Exam: unable due to tele-visit.   Labs reviewed: Basic Metabolic Panel: Recent Labs    06/03/18 0955 10/16/18 0849 02/20/19 0843  NA 137 141 140  K 3.9 4.0 3.8  CL 99 102 102  CO2 31 30 31   GLUCOSE 100* 96 109*  BUN 30* 31* 22  CREATININE 1.08* 0.94* 0.95*  CALCIUM 9.4 9.2 9.4  TSH 1.23 2.00 2.27   Liver Function Tests: Recent Labs    06/03/18 0955 10/16/18 0849  AST 15 17  ALT 7 8  BILITOT 0.5 0.4  PROT 6.5 6.3   No results for input(s): LIPASE, AMYLASE in the last 8760 hours. No results for input(s): AMMONIA in the last 8760 hours. CBC: Recent Labs    09/06/18 1110 10/16/18 0849 02/20/19 0843  WBC 8.2 5.1 5.6  NEUTROABS 5,863 3,366 3,797  HGB 13.8 13.2 11.5*  HCT 41.3 39.5 34.9*  MCV 89.2 89.0 85.7  PLT 253 221 270   Lipid Panel: Recent Labs    10/16/18 0849  CHOL 172  HDL 56  LDLCALC 97  TRIG 98  CHOLHDL 3.1   TSH: Recent Labs    06/03/18 0955 10/16/18 0849 02/20/19 0843  TSH 1.23 2.00 2.27   A1C: Lab Results  Component Value Date   HGBA1C 5.1 10/16/2018     Assessment/Plan 1. Ductal carcinoma in situ (DCIS) of left breast -states she was aware mass was most likely cancer. confirmed diagnosis with pt of newly diagnosised breast cancer confirmed with biopsy. Discussed results with pt and daughter. Pt would like to "get it out" and would like consultation with oncologist for treatment options.  - Ambulatory referral to Hematology / Oncology  Carlos American. Harle Battiest  [facility] [phone]   Follow Up Instructions:    I discussed the assessment and treatment plan with the patient. The patient was provided an opportunity to ask questions and all were answered. The patient agreed with the plan and demonstrated an understanding of the instructions.   The patient was advised to call back or seek an in-person evaluation if the symptoms worsen or if the condition fails to improve as anticipated.  I provided 8 minutes of non-face-to-face time during this encounter. Avs printed and mailed.   Lauree Chandler, NP

## 2019-03-10 DIAGNOSIS — C50912 Malignant neoplasm of unspecified site of left female breast: Secondary | ICD-10-CM | POA: Diagnosis not present

## 2019-03-11 ENCOUNTER — Telehealth: Payer: Self-pay | Admitting: Hematology and Oncology

## 2019-03-11 NOTE — Telephone Encounter (Signed)
A new patient appt has been scheduled for the pt to see Dr. Lindi Adie on 4/22  At 1pm.

## 2019-03-11 NOTE — Progress Notes (Signed)
Location of Breast Cancer: Left Breast  Histology per Pathology Report:  03/03/19 Diagnosis Breast, left, needle core biopsy, 12:30 o'clock - INVASIVE DUCTAL CARCINOMA. - DUCTAL CARCINOMA IN SITU.  Receptor Status: ER(95%), PR (70%), Her2-neu (POS), Ki-(10%)  Did patient present with symptoms or was this found on screening mammography?: She presented with upper left breast pain and white nipple discharge.   Past/Anticipated interventions by surgeon, if any: Dr. Ninfa Linden 03/10/19  Past/Anticipated interventions by medical oncology, if any:  Dr. Lindi Adie 03/10/19  Lymphedema issues, if any: N/A  Pain issues, if any: Yes, her left breast.   SAFETY ISSUES:  Prior radiation? No  Pacemaker/ICD? No  Possible current pregnancy? No  Is the patient on methotrexate? No  Current Complaints / other details:      Andrea [name], Stephani Police, RN 03/11/2019,1:14 PM

## 2019-03-12 ENCOUNTER — Other Ambulatory Visit: Payer: Self-pay

## 2019-03-12 ENCOUNTER — Inpatient Hospital Stay: Payer: Medicare Other | Attending: Hematology and Oncology | Admitting: Hematology and Oncology

## 2019-03-12 ENCOUNTER — Ambulatory Visit
Admission: RE | Admit: 2019-03-12 | Discharge: 2019-03-12 | Disposition: A | Discharge status: Home / Self Care | Payer: Medicare Other | Source: Ambulatory Visit | Attending: Radiation Oncology | Admitting: Radiation Oncology

## 2019-03-12 ENCOUNTER — Telehealth: Payer: Self-pay | Admitting: *Deleted

## 2019-03-12 ENCOUNTER — Encounter: Payer: Self-pay | Admitting: Radiation Oncology

## 2019-03-12 DIAGNOSIS — R7303 Prediabetes: Secondary | ICD-10-CM | POA: Diagnosis not present

## 2019-03-12 DIAGNOSIS — Z86718 Personal history of other venous thrombosis and embolism: Secondary | ICD-10-CM | POA: Insufficient documentation

## 2019-03-12 DIAGNOSIS — Z170 Estrogen receptor positive status [ER+]: Secondary | ICD-10-CM | POA: Insufficient documentation

## 2019-03-12 DIAGNOSIS — G4730 Sleep apnea, unspecified: Secondary | ICD-10-CM | POA: Insufficient documentation

## 2019-03-12 DIAGNOSIS — F418 Other specified anxiety disorders: Secondary | ICD-10-CM | POA: Diagnosis not present

## 2019-03-12 DIAGNOSIS — C50412 Malignant neoplasm of upper-outer quadrant of left female breast: Secondary | ICD-10-CM

## 2019-03-12 DIAGNOSIS — Z79899 Other long term (current) drug therapy: Secondary | ICD-10-CM | POA: Diagnosis not present

## 2019-03-12 DIAGNOSIS — E039 Hypothyroidism, unspecified: Secondary | ICD-10-CM | POA: Insufficient documentation

## 2019-03-12 DIAGNOSIS — Z87891 Personal history of nicotine dependence: Secondary | ICD-10-CM | POA: Diagnosis not present

## 2019-03-12 DIAGNOSIS — M1990 Unspecified osteoarthritis, unspecified site: Secondary | ICD-10-CM | POA: Diagnosis not present

## 2019-03-12 DIAGNOSIS — Z7901 Long term (current) use of anticoagulants: Secondary | ICD-10-CM | POA: Insufficient documentation

## 2019-03-12 MED ORDER — ANASTROZOLE 1 MG PO TABS: 1.0000 mg | ORAL_TABLET | Freq: Every day | ORAL | 3 refills | Status: DC

## 2019-03-12 NOTE — Assessment & Plan Note (Signed)
03/03/2019:Left breast pain and right nipple discharge, mammogram revealed 1.4 cm left breast mass which measured 1.7 x 1 x 1.5 cm retroareolar with mild nipple retraction, no enlarged lymph nodes: Biopsy revealed grade 3 IDC with DCIS ER 95%, PR 70%, Ki-67 10%, HER-2 positive ratio 2.3 and copy #4.2, T1CN0 stage Ia clinical stage  Pathology and radiology counseling: Discussed with the patient, the details of pathology including the type of breast cancer,the clinical staging, the significance of ER, PR and HER-2/neu receptors and the implications for treatment. After reviewing the pathology in detail, we proceeded to discuss the different treatment options between surgery, radiation, chemotherapy, antiestrogen therapies.  Recommendation: 1.  Breast conserving surgery 2.  Adjuvant Herceptin 3.  Adjuvant radiation 4.  Adjuvant antiestrogen therapy  Return to clinic after surgery to discuss final pathology report.

## 2019-03-12 NOTE — Progress Notes (Signed)
[facility] CONSULT NOTE  Patient Care Team: Gayland Curry, DO as PCP - General (Geriatric Medicine) Tanda Rockers, MD as Consulting Physician (Pulmonary Disease) Deveron Furlong, NP as Nurse Practitioner (Psychiatry)  CHIEF COMPLAINTS/PURPOSE OF CONSULTATION:  Newly diagnosed breast cancer  HISTORY OF PRESENTING ILLNESS:  Andrea [name] 84 y.o. female is here because of recent diagnosis of left breast cancer.  Patient had left breast pain and nipple discharge.  This led to evaluation with mammogram and ultrasound that revealed a 1.7 cm area of the left breast retroareolar region which was biopsied pathology revealed grade 3 invasive ductal carcinoma that is ER PR positive HER-2 positive.  She is here today to discuss the treatment plan.  She has extensive health issues and her performance status is ECOG of 2-3.  She is in a wheelchair.  I reviewed her records extensively and collaborated the history with the patient.  SUMMARY OF ONCOLOGIC HISTORY:   Malignant neoplasm of upper-outer quadrant of left breast in female, estrogen receptor positive ([place])   03/03/2019 Initial Diagnosis    Left breast pain and right nipple discharge, mammogram revealed 1.4 cm left breast mass which measured 1.7 x 1 x 1.5 cm retroareolar with mild nipple retraction, no enlarged lymph nodes: Biopsy revealed grade 3 IDC with DCIS ER 95%, PR 70%, Ki-67 10%, HER-2 positive ratio 2.3 and copy #4.2, T1CN0 stage Ia clinical stage    03/12/2019 Cancer Staging    Staging form: Breast, AJCC 8th Edition - Clinical: Stage IA (cT1c, cN0, cM0, G3, ER+, PR+, HER2+) - Signed by Gardenia Phlegm, NP on 03/12/2019      MEDICAL HISTORY:  Past Medical History:  Diagnosis Date  . Anemia   . Anxiety   . Arthritis    hands, spine   . Blood transfusion   . Cervicalgia   . Constipation   . Cough    Wert-onset 08/2009, as of 2014- resolved   . Deep venous thrombosis ([place])    post op, rec'd/needed  blood  thinner   . Depression   . Depression with anxiety   . Dysrhythmia   . Falls frequently    pt. reports that she was falling 3 times a day, has had PT at a facility for a while & now is getting home PT 2-3 times/ week   . Headache(784.0) 09/27/2011  . History of stress test    15-20 yrs.ago   . Hypotension   . Hypothyroidism   . Lumbar radiculopathy 09/26/2011  . Pneumonia    never been in hosp. for pneumonia   . Prediabetes   . Right hand fracture    Monday  . Shortness of breath   . Sleep apnea    doesn't use CPAP any longer, since weight loss    SURGICAL HISTORY: Past Surgical History:  Procedure Laterality Date  . ANTERIOR CERVICAL DECOMP/DISCECTOMY FUSION N/A 07/09/2013   Procedure: ANTERIOR CERVICAL DECOMPRESSION/DISCECTOMY FUSION 3 LEVELS;  Surgeon: Sinclair Ship, MD;  Location: [place];  Service: Orthopedics;  Laterality: N/A;  Anterior cervical decompression fusion, cervical 3-4, cervical 4-5-, cervical 5-6 with instrumentation, allograft.  . APPENDECTOMY    . BACK SURGERY     2000  . BREAST EXCISIONAL BIOPSY     breast bxs on left x 2, breast bx of right x 1-all benign  . EYE SURGERY     cataracts bilateral /w IOL  . HERNIA REPAIR     umbilical hernia- 0092  . JOINT REPLACEMENT  [id number]   bilateral  . LAPAROSCOPIC OVARIAN CYSTECTOMY     not done by laparscopy-abdominal incision  . REPLACEMENT TOTAL KNEE BILATERAL    . TONSILLECTOMY      SOCIAL HISTORY: Social History   Socioeconomic History  . Marital status: Single    Spouse name: Not on file  . Number of children: 3  . Years of education: College  . Highest education level: Not on file  Occupational History  . Occupation: Retired Animal nutritionist  . Financial resource strain: Not very hard  . Food insecurity:    Worry: Never true    Inability: Never true  . Transportation needs:    Medical: No    Non-medical: No  Tobacco Use  . Smoking status: Former Smoker    Years: 20.00    Types:  Cigarettes  . Smokeless tobacco: Never Used  . Tobacco comment: Started back a few months ago as of 02/20/2019. 5-6 cig's daily  Substance and Sexual Activity  . Alcohol use: Yes    Comment: rare  . Drug use: No  . Sexual activity: Never  Lifestyle  . Physical activity:    Days per week: 0 days    Minutes per session: 0 min  . Stress: Only a little  Relationships  . Social connections:    Talks on phone: More than three times a week    Gets together: More than three times a week    Attends religious service: More than 4 times per year    Active member of club or organization: Yes    Attends meetings of clubs or organizations: Never    Relationship status: Divorced  . Intimate partner violence:    Fear of current or ex partner: No    Emotionally abused: No    Physically abused: No    Forced sexual activity: No  Other Topics Concern  . Not on file  Social History Narrative   Patient is single and lives alone.   Patient is retired.   Patient has a college education.   Patient has three adult children   Patient is right-handed.   Patient drinks 3-4 cups of caffeine (soda and coffee) daily.   Walks with walker    FAMILY HISTORY: Family History  Problem Relation Age of Onset  . Heart disease Father   . Malignant hyperthermia Father   . Arthritis Father   . Deep vein thrombosis Son   . Diabetes Son   . Obesity Son   . Depression Son   . Hypertension Son   . Parkinson's disease Mother   . Diabetes Daughter   . Hypertension Daughter   . Anxiety disorder Daughter   . Hyperlipidemia Son   . Hypertension Son   . Cancer Maternal Aunt     ALLERGIES:  is allergic to clindamycin hcl.  MEDICATIONS:  Current Outpatient Medications  Medication Sig Dispense Refill  . Brexpiprazole 1 MG TABS Take 1 tablet by mouth at bedtime.    [name] CALCIUM CITRATE PO Take 600 mg by mouth 2 (two) times daily.    . clonazePAM (KLONOPIN) 0.5 MG tablet Take 1 tablet by mouth as needed in middle  of the nite to go back to sleep 20 tablet 0  . CRANBERRY EXTRACT PO Take 2 tablets by mouth daily.    . DULoxetine (CYMBALTA) 60 MG capsule Take 2 capsules (120 mg total) by mouth daily. 180 capsule 3  . ELIQUIS 5 MG TABS tablet TAKE 1 TABLET BY  MOUTH TWICE A DAY 60 tablet 5  . furosemide (LASIX) 40 MG tablet TAKE 1/2 TABLET BY MOUTH EVERY DAY 45 tablet 1  . levothyroxine (SYNTHROID, LEVOTHROID) 50 MCG tablet TAKE ONE TABLET BY MOUTH ONCE DAILY 30 MINUTES BEFORE BREAKFAST FOR THYROID 90 tablet 0  . mirabegron ER (MYRBETRIQ) 50 MG TB24 tablet Take 1 tablet (50 mg total) by mouth daily. 30 tablet 3  . mirtazapine (REMERON) 15 MG tablet Take 7.5 mg by mouth at bedtime.  2  . Multiple Vitamins-Minerals (MULTIVITAMIN PO) Take 1 tablet by mouth.    . oxyCODONE-acetaminophen (PERCOCET) 10-325 MG tablet Take 1 tab every 4 hrs as needed for moderate pain and 2 tabs every 4 hrs as needed for severe pain 180 tablet 0  . traZODone (DESYREL) 100 MG tablet Take 2 tablets by mouth at bedtime     No current facility-administered medications for this visit.     REVIEW OF SYSTEMS:   Constitutional: Denies fevers, chills or abnormal night sweats Eyes: Denies blurriness of vision, double vision or watery eyes Ears, nose, mouth, throat, and face: Denies mucositis or sore throat Respiratory: Denies cough, dyspnea or wheezes Cardiovascular: Denies palpitation, chest discomfort or lower extremity swelling Gastrointestinal:  Denies nausea, heartburn or change in bowel habits Skin: Denies abnormal skin rashes Lymphatics: Denies new lymphadenopathy or easy bruising Neurological:Denies numbness, tingling or new weaknesses Behavioral/Psych: Mood is stable, no new changes  Breast: Pain in the left breast currently at 4 out of 10 but it can be up to 8 out of 10 All other systems were reviewed with the patient and are negative.  PHYSICAL EXAMINATION: ECOG PERFORMANCE STATUS: 2 - Symptomatic, <50% confined to  bed  Vitals:   03/12/19 1258  BP: 138/62  Pulse: 66  Resp: 17  Temp: 98.3 F (36.8 C)  SpO2: 98%   Filed Weights   03/12/19 1258  Weight: 201 lb 14.4 oz (91.6 kg)    GENERAL:alert, no distress and comfortable SKIN: skin color, texture, turgor are normal, no rashes or significant lesions EYES: normal, conjunctiva are pink and non-injected, sclera clear OROPHARYNX:no exudate, no erythema and lips, buccal mucosa, and tongue normal  NECK: supple, thyroid normal size, non-tender, without nodularity LYMPH:  no palpable lymphadenopathy in the cervical, axillary or inguinal LUNGS: clear to auscultation and percussion with normal breathing effort HEART: regular rate & rhythm and no murmurs and no lower extremity edema ABDOMEN:abdomen soft, non-tender and normal bowel sounds Musculoskeletal:no cyanosis of digits and no clubbing  PSYCH: alert & oriented x 3 with fluent speech NEURO: no focal motor/sensory deficits BREAST: Palpable lump in the left breast superior aspect which is also tender. No palpable axillary or supraclavicular lymphadenopathy (exam performed in the presence of a chaperone)   LABORATORY DATA:  I have reviewed the data as listed Lab Results  Component Value Date   WBC 5.6 02/20/2019   HGB 11.5 (L) 02/20/2019   HCT 34.9 (L) 02/20/2019   MCV 85.7 02/20/2019   PLT 270 02/20/2019   Lab Results  Component Value Date   NA 140 02/20/2019   K 3.8 02/20/2019   CL 102 02/20/2019   CO2 31 02/20/2019    RADIOGRAPHIC STUDIES: I have personally reviewed the radiological reports and agreed with the findings in the report.  ASSESSMENT AND PLAN:  Malignant neoplasm of upper-outer quadrant of left breast in female, estrogen receptor positive ([place]) 03/03/2019:Left breast pain and right nipple discharge, mammogram revealed 1.4 cm left breast mass which measured 1.7 x  1 x 1.5 cm retroareolar with mild nipple retraction, no enlarged lymph nodes: Biopsy revealed grade 3 IDC with  DCIS ER 95%, PR 70%, Ki-67 10%, HER-2 positive ratio 2.3 and copy #4.2, T1CN0 stage Ia clinical stage  Pathology and radiology counseling: Discussed with the patient, the details of pathology including the type of breast cancer,the clinical staging, the significance of ER, PR and HER-2/neu receptors and the implications for treatment. After reviewing the pathology in detail, we proceeded to discuss the different treatment options between surgery, radiation, chemotherapy, antiestrogen therapies.  Recommendation: 1.  Breast conserving surgery 2.  Adjuvant Herceptin 3.  Adjuvant radiation 4.  Adjuvant antiestrogen therapy  Because her surgery could be postponed until June, I started her on anastrozole.  I discussed with her the risks and benefits of anastrozole and she appears to understand them and is willing to proceed.  She will stop anastrozole 3 days prior to surgery.  Return to clinic after surgery to discuss final pathology report.   All questions were answered. The patient knows to call the clinic with any problems, questions or concerns.    Harriette Ohara, MD 03/12/19

## 2019-03-12 NOTE — Telephone Encounter (Signed)
Called pt and spoke to pt and daughter. Denies questions or concerns regarding dx or tx care plan. Contact information provided

## 2019-03-12 NOTE — Progress Notes (Signed)
Radiation Oncology         [phone] ________________________________  Initial outpatient TELEMEDICINE Consultation  Name: Andrea [name] MRN: [id number]  Date: 03/12/2019  DOB: 06-Apr-1935  LP:FXTK, Rexene Edison, DO  [name], MD   REFERRING PHYSICIAN: [name], MD  DIAGNOSIS:    ICD-10-CM   1. Malignant neoplasm of upper-outer quadrant of left breast in female, estrogen receptor positive ([place]) C50.412    Z17.0     Cancer Staging Malignant neoplasm of upper-outer quadrant of left breast in female, estrogen receptor positive ([place]) Staging form: Breast, AJCC 8th Edition - Clinical: Stage IA (cT1c, cN0, cM0, G3, ER+, PR+, HER2+) - Signed by Gardenia Phlegm, NP on 03/12/2019   CHIEF COMPLAINT:   left breast cancer  HISTORY OF PRESENT ILLNESS::Andrea [name] is a 84 y.o. female who presented with left breast pain. She was then found to have a breast abnormality on the following imaging: mammogram  on the date of 03-03-19 showed a left breast mass , 1.4cm.  Symptoms, if any, at that time, were: pain and white nipple discharge.   Ultrasound of breast on 03-03-19 revealed the mass to be 1.7cm in the  12:00 region of the left breast; axilla clinically negative on [place] .   Biopsy   03-03-19 showed invasive ductal carcinoma.  ER status: +; PR status +, Her2 status +; Grade 3.  Patient presents via WebEx with her daughter today due to the pandemic precautions.  She reports chronic back pain, arthritis, uses walker to ambulate, needs help bathing, some incontinence. No ulcers or skin changes on breast.   PREVIOUS RADIATION THERAPY: No  PAST MEDICAL HISTORY:  has a past medical history of Anemia, Anxiety, Arthritis, Blood transfusion, Cervicalgia, Constipation, Cough, Deep venous thrombosis ([place]), Depression, Depression with anxiety, Dysrhythmia, Falls frequently, Headache(784.0) (09/27/2011), History of stress test, Hypotension, Hypothyroidism, Lumbar radiculopathy (09/26/2011),  Pneumonia, Prediabetes, Right hand fracture, Shortness of breath, and Sleep apnea.    PAST SURGICAL HISTORY: Past Surgical History:  Procedure Laterality Date  . ANTERIOR CERVICAL DECOMP/DISCECTOMY FUSION N/A 07/09/2013   Procedure: ANTERIOR CERVICAL DECOMPRESSION/DISCECTOMY FUSION 3 LEVELS;  Surgeon: Sinclair Ship, MD;  Location: [facility];  Service: Orthopedics;  Laterality: N/A;  Anterior cervical decompression fusion, cervical 3-4, cervical 4-5-, cervical 5-6 with instrumentation, allograft.  . APPENDECTOMY    . BACK SURGERY     2000  . BREAST EXCISIONAL BIOPSY     breast bxs on left x 2, breast bx of right x 1-all benign  . EYE SURGERY     cataracts bilateral /w IOL  . HERNIA REPAIR     umbilical hernia- 2409  . JOINT REPLACEMENT  2006,2007   bilateral  . LAPAROSCOPIC OVARIAN CYSTECTOMY     not done by laparscopy-abdominal incision  . REPLACEMENT TOTAL KNEE BILATERAL    . TONSILLECTOMY      FAMILY HISTORY: family history includes Anxiety disorder in her daughter; Arthritis in her father; Cancer in her maternal aunt; Deep vein thrombosis in her son; Depression in her son; Diabetes in her daughter and son; Heart disease in her father; Hyperlipidemia in her son; Hypertension in her daughter, son, and son; Malignant hyperthermia in her father; Obesity in her son; Parkinson's disease in her mother.  SOCIAL HISTORY:  reports that she has quit smoking. Her smoking use included cigarettes. She quit after 20.00 years of use. She has never used smokeless tobacco. She reports current alcohol use. She reports that she does not use drugs.  ALLERGIES: Clindamycin hcl  MEDICATIONS:  Current Outpatient Medications  Medication Sig Dispense Refill  . Brexpiprazole 1 MG TABS Take 1 tablet by mouth at bedtime.    [name] CALCIUM CITRATE PO Take 600 mg by mouth 2 (two) times daily.    . clonazePAM (KLONOPIN) 0.5 MG tablet Take 1 tablet by mouth as needed in middle of the nite to go back to sleep 20  tablet 0  . CRANBERRY EXTRACT PO Take 2 tablets by mouth daily.    . DULoxetine (CYMBALTA) 60 MG capsule Take 2 capsules (120 mg total) by mouth daily. 180 capsule 3  . ELIQUIS 5 MG TABS tablet TAKE 1 TABLET BY MOUTH TWICE A DAY 60 tablet 5  . furosemide (LASIX) 40 MG tablet TAKE 1/2 TABLET BY MOUTH EVERY DAY 45 tablet 1  . levothyroxine (SYNTHROID, LEVOTHROID) 50 MCG tablet TAKE ONE TABLET BY MOUTH ONCE DAILY 30 MINUTES BEFORE BREAKFAST FOR THYROID 90 tablet 0  . mirabegron ER (MYRBETRIQ) 50 MG TB24 tablet Take 1 tablet (50 mg total) by mouth daily. 30 tablet 3  . mirtazapine (REMERON) 15 MG tablet Take 7.5 mg by mouth at bedtime.  2  . Multiple Vitamins-Minerals (MULTIVITAMIN PO) Take 1 tablet by mouth.    . oxyCODONE-acetaminophen (PERCOCET) 10-325 MG tablet Take 1 tab every 4 hrs as needed for moderate pain and 2 tabs every 4 hrs as needed for severe pain 180 tablet 0  . traZODone (DESYREL) 100 MG tablet Take 2 tablets by mouth at bedtime     No current facility-administered medications for this encounter.     REVIEW OF SYSTEMS: A 10+ POINT REVIEW OF SYSTEMS WAS OBTAINED including neurology, dermatology, psychiatry, cardiac, respiratory, lymph, extremities, GI, GU, Musculoskeletal, constitutional, breasts, reproductive, HEENT.  All pertinent positives are noted in the HPI.  All others are negative.   PHYSICAL EXAM:  vitals were not taken for this visit.   General: Alert and oriented, in no acute distress  ECOG = 3  0 - Asymptomatic (Fully active, able to carry on all predisease activities without restriction)  1 - Symptomatic but completely ambulatory (Restricted in physically strenuous activity but ambulatory and able to carry out work of a light or sedentary nature. For example, light housework, office work)  2 - Symptomatic, <50% in bed during the day (Ambulatory and capable of all self care but unable to carry out any work activities. Up and about more than 50% of waking hours)  3  - Symptomatic, >50% in bed, but not bedbound (Capable of only limited self-care, confined to bed or chair 50% or more of waking hours)  4 - Bedbound (Completely disabled. Cannot carry on any self-care. Totally confined to bed or chair)  5 - Death   Eustace Pen MM, Creech RH, Tormey DC, et al. [phone]). "Toxicity and response criteria of the [facility]". [place] Oncol. 5 (6): 649-55   LABORATORY DATA:  Lab Results  Component Value Date   WBC 5.6 02/20/2019   HGB 11.5 (L) 02/20/2019   HCT 34.9 (L) 02/20/2019   MCV 85.7 02/20/2019   PLT 270 02/20/2019   CMP     Component Value Date/Time   NA 140 02/20/2019 0843   NA 139 08/14/2016   K 3.8 02/20/2019 0843   CL 102 02/20/2019 0843   CO2 31 02/20/2019 0843   GLUCOSE 109 (H) 02/20/2019 0843   BUN 22 02/20/2019 0843   BUN 28 (A) 08/14/2016   CREATININE 0.95 (H) 02/20/2019 0843   CALCIUM  9.4 02/20/2019 0843   PROT 6.3 10/16/2018 0849   PROT 6.2 12/20/2015 1357   ALBUMIN 4.0 03/01/2017 0820   ALBUMIN 4.2 12/20/2015 1357   AST 17 10/16/2018 0849   ALT 8 10/16/2018 0849   ALKPHOS 54 03/01/2017 0820   BILITOT 0.4 10/16/2018 0849   BILITOT 0.2 12/20/2015 1357   GFRNONAA 57 (L) 10/16/2018 0849   GFRAA 66 10/16/2018 0849         RADIOGRAPHY: US Breast Ltd Uni Left Inc Axilla  Result Date: 03/03/2019 CLINICAL DATA:  Patient presents with upper left breast pain as well as a history of a white nipple discharge. EXAM: DIGITAL DIAGNOSTIC BILATERAL MAMMOGRAM WITH CAD AND TOMO ULTRASOUND LEFT BREAST COMPARISON:  Previous exam(s). ACR Breast Density Category b: There are scattered areas of fibroglandular density. FINDINGS: In the posterior aspect of the left breast, visualized on the MLO images only, there is a spiculated mass containing a few calcifications. There is associated architectural distortion. Mass measures approximately 1.4 cm in size. There are no other breast masses, no other areas of architectural distortion  and no other suspicious calcifications. Mammographic images were processed with CAD. On physical exam, no mass is palpated in the retroareolar or upper right breast. Targeted ultrasound is performed, showing a hypoechoic mass with irregular, partly ill-defined margins in the left breast at 12 o'clock, retroareolar position, with associated architectural distortion and mild nipple retraction, measuring 1.7 x 1.0 x 1.5 cm. Sonographic evaluation of the left axilla shows no enlarged or abnormal lymph nodes. IMPRESSION: 1. 1.7 cm left breast mass associated with architectural distortion and nipple retraction. This is highly suspicious for breast carcinoma. 2. No left axillary lymphadenopathy. No evidence of right breast malignancy. RECOMMENDATION: 1. Ultrasound-guided core needle biopsy of the left breast mass. This will be performed today. I have discussed the findings and recommendations with the patient. Results were also provided in writing at the conclusion of the visit. If applicable, a reminder letter will be sent to the patient regarding the next appointment. BI-RADS CATEGORY  5: Highly suggestive of malignancy. Electronically Signed   By: Lajean Manes M.D.   On: 03/03/2019 16:01   Mm Diag Breast Tomo Bilateral  Result Date: 03/03/2019 CLINICAL DATA:  Patient presents with upper left breast pain as well as a history of a white nipple discharge. EXAM: DIGITAL DIAGNOSTIC BILATERAL MAMMOGRAM WITH CAD AND TOMO ULTRASOUND LEFT BREAST COMPARISON:  Previous exam(s). ACR Breast Density Category b: There are scattered areas of fibroglandular density. FINDINGS: In the posterior aspect of the left breast, visualized on the MLO images only, there is a spiculated mass containing a few calcifications. There is associated architectural distortion. Mass measures approximately 1.4 cm in size. There are no other breast masses, no other areas of architectural distortion and no other suspicious calcifications. Mammographic  images were processed with CAD. On physical exam, no mass is palpated in the retroareolar or upper right breast. Targeted ultrasound is performed, showing a hypoechoic mass with irregular, partly ill-defined margins in the left breast at 12 o'clock, retroareolar position, with associated architectural distortion and mild nipple retraction, measuring 1.7 x 1.0 x 1.5 cm. Sonographic evaluation of the left axilla shows no enlarged or abnormal lymph nodes. IMPRESSION: 1. 1.7 cm left breast mass associated with architectural distortion and nipple retraction. This is highly suspicious for breast carcinoma. 2. No left axillary lymphadenopathy. No evidence of right breast malignancy. RECOMMENDATION: 1. Ultrasound-guided core needle biopsy of the left breast mass. This will be performed today.  I have discussed the findings and recommendations with the patient. Results were also provided in writing at the conclusion of the visit. If applicable, a reminder letter will be sent to the patient regarding the next appointment. BI-RADS CATEGORY  5: Highly suggestive of malignancy. Electronically Signed   By: Lajean Manes M.D.   On: 03/03/2019 16:01   Mm Clip Placement Left  Result Date: 03/03/2019 CLINICAL DATA:  Status post ultrasound-guided biopsy of a LEFT breast mass at the 12:30 o'clock axis. EXAM: DIAGNOSTIC LEFT MAMMOGRAM POST ULTRASOUND BIOPSY COMPARISON:  Previous exam(s). FINDINGS: Mammographic images were obtained following ultrasound guided biopsy of the LEFT breast mass at the 12:30 o'clock axis. Ribbon shaped clip is appropriately positioned. IMPRESSION: Ribs shaped clip is well-positioned within the targeted mass in the LEFT breast at the 12:30 o'clock axis. Final Assessment: Post Procedure Mammograms for Marker Placement Electronically Signed   By: Franki Cabot M.D.   On: 03/03/2019 16:32   [place] Lt Breast Bx W Loc Dev 1st Lesion Img Bx Spec US Guide  Addendum Date: 03/07/2019   ADDENDUM REPORT: 03/05/2019  11:24 ADDENDUM: Pathology revealed GRADE III INVASIVE DUCTAL CARCINOMA, DUCTAL CARCINOMA IN SITU of the Left breast, 12:30 o'clock. This was found to be concordant by Dr. Franki Cabot. Pathology results were discussed with the patient's daughter, Marlean Mortell by telephone, per patient request. The patient's daughter reported her mother did well after the biopsy with tenderness at the site. Post biopsy instructions and care were reviewed and questions were answered. The patient's daughter was encouraged to call [facility] for any additional concerns. Surgical consultation has been arranged with Dr. Nedra Hai at [facility] on March 10, 2019. Pathology results reported by Terie Purser, RN on 03/05/2019. Electronically Signed   By: Franki Cabot M.D.   On: 03/05/2019 11:24   Result Date: 03/07/2019 CLINICAL DATA:  Patient with a LEFT breast mass presents for ultrasound-guided core biopsy. EXAM: ULTRASOUND GUIDED LEFT BREAST CORE NEEDLE BIOPSY COMPARISON:  Previous exam(s). PROCEDURE: I met with the patient and we discussed the procedure of ultrasound-guided biopsy, including benefits and alternatives. We discussed the high likelihood of a successful procedure. We discussed the risks of the procedure including infection, bleeding, tissue injury, clip migration, and inadequate sampling. Informed written consent was given. The usual time-out protocol was performed immediately prior to the procedure. Lesion quadrant: Upper outer quadrant Using sterile technique and 1% Lidocaine as local anesthetic, under direct ultrasound visualization, a 12 gauge spring-loaded device was used to perform biopsy of the LEFT breast mass at the 12:30 o'clock axisusing a lateral approach. At the conclusion of the procedure, a ribbon shaped tissue marker clip was deployed into the biopsy cavity. Follow-up 2-view mammogram was performed and dictated separately. IMPRESSION: Ultrasound-guided biopsy of  the LEFT breast mass at the 12:30 o'clock axis. No apparent complications. Electronically Signed: By: Franki Cabot M.D. On: 03/03/2019 16:22      IMPRESSION/PLAN: Cancer Staging Malignant neoplasm of upper-outer quadrant of left breast in female, estrogen receptor positive ([place]) Staging form: Breast, AJCC 8th Edition - Clinical: Stage IA (cT1c, cN0, cM0, G3, ER+, PR+, HER2+) - Signed by Gardenia Phlegm, NP on 03/12/2019    It was a pleasure meeting the patient today. We discussed the risks, benefits, and side effects of radiotherapy. I estimate her risk of recurrence in the breast will be 10-20% over the next decade after lumpectomy and antiestrogen therapy, and reduced to under 5% if she undergoes  radiotherapy. Radiotherapy would not affect her life expectancy.  It would be given to the breast over 3-4 weeks.   We discussed the available radiation techniques, and focused on the details of logistics and delivery.   We discussed the risks, benefits, and side effects of radiotherapy.   The patient was encouraged to ask questions that I answered to the best of my ability.   Pt has pending appt to discuss systemic therapy with medical oncology.  Surgery is pending, too.  I would like to see her back after surgery when she is ready to consider the pros/cons of radiotherapy and reassess her pathology with me.  Patient and daughter like that plan.    This encounter was provided by telemedicine platform Webex.  The patient has given verbal consent for this type of encounter and has been advised to only accept a meeting of this type in a secure network environment. The time spent during this encounter was 13 minutes. The attendants for this meeting include Eppie Gibson  and Vinnie Level.  During the encounter, Eppie Gibson was located at [facility] Radiation Oncology Department.  Marybelle Giraldo was located at home.    __________________________________________   Eppie Gibson, MD

## 2019-03-14 ENCOUNTER — Other Ambulatory Visit: Payer: Self-pay | Admitting: Surgery

## 2019-03-14 ENCOUNTER — Telehealth: Payer: Self-pay

## 2019-03-14 DIAGNOSIS — Z853 Personal history of malignant neoplasm of breast: Secondary | ICD-10-CM

## 2019-03-14 NOTE — Telephone Encounter (Signed)
   [facility] Pre-operative Risk Assessment    Request for surgical clearance:  1. What type of surgery is being performed? Left breast mastectomy or lumpectomy  2. When is this surgery scheduled? TBD   3. What type of clearance is required (medical clearance vs. Pharmacy clearance to hold med vs. Both)? Pharmacy  4. Are there any medications that need to be held prior to surgery and how long?Eliquis   5. Practice name and name of physician performing surgery? [facility]/ Dr Ninfa Linden   6. What is your office phone number  [phone]    7.   What is your office fax number [phone]  8.   Anesthesia type (None, local, MAC, general) ? general   Frederik Schmidt 03/14/2019, 11:43 AM  _________________________________________________________________   (provider comments below)

## 2019-03-14 NOTE — Telephone Encounter (Addendum)
   Pt has not been seen in our office since 2012. We do not have any knowledge of her current clinical situation. PCP is prescribing her Eliquis.  Please contact Dr Hollace Kinnier or Joelene Millin, NP for information on her current clinical situation and feasibility of holding her Eliquis.   I will route this to PCP and Dr Ninfa Linden.  Please contact us again if we can help in any way.  Rosaria Ferries, PA-C 03/14/2019 1:42 PM Phone [phone]  [facility] Dr Ninfa Linden Phone [phone] Fax [phone]

## 2019-03-17 ENCOUNTER — Other Ambulatory Visit: Payer: Self-pay | Admitting: *Deleted

## 2019-03-17 DIAGNOSIS — G894 Chronic pain syndrome: Secondary | ICD-10-CM

## 2019-03-17 MED ORDER — OXYCODONE-ACETAMINOPHEN 10-325 MG PO TABS: ORAL_TABLET | ORAL | 0 refills | Status: DC

## 2019-03-17 NOTE — Telephone Encounter (Signed)
Patient requested refill.  [place] Verified LR: 02/12/2019 Pended Rx and sent to Dr. Mariea Clonts for approval.

## 2019-03-19 ENCOUNTER — Other Ambulatory Visit: Payer: Self-pay | Admitting: Surgery

## 2019-03-19 DIAGNOSIS — Z853 Personal history of malignant neoplasm of breast: Secondary | ICD-10-CM

## 2019-03-20 ENCOUNTER — Other Ambulatory Visit: Payer: Self-pay | Admitting: Internal Medicine

## 2019-03-20 ENCOUNTER — Other Ambulatory Visit: Payer: Self-pay | Admitting: Surgery

## 2019-03-20 DIAGNOSIS — Z853 Personal history of malignant neoplasm of breast: Secondary | ICD-10-CM

## 2019-03-21 ENCOUNTER — Telehealth: Payer: Self-pay | Admitting: Hematology and Oncology

## 2019-03-21 NOTE — Telephone Encounter (Signed)
Scheduled appt for 5/20 per sch msg. Called and spoke with patients daughter. Confirmed date and time of appt

## 2019-03-24 ENCOUNTER — Other Ambulatory Visit: Payer: Self-pay | Admitting: *Deleted

## 2019-03-24 DIAGNOSIS — C50412 Malignant neoplasm of upper-outer quadrant of left female breast: Secondary | ICD-10-CM

## 2019-03-24 DIAGNOSIS — Z170 Estrogen receptor positive status [ER+]: Principal | ICD-10-CM

## 2019-03-26 ENCOUNTER — Telehealth: Payer: Self-pay | Admitting: *Deleted

## 2019-03-26 ENCOUNTER — Encounter (HOSPITAL_BASED_OUTPATIENT_CLINIC_OR_DEPARTMENT_OTHER): Payer: Self-pay | Admitting: *Deleted

## 2019-03-26 ENCOUNTER — Other Ambulatory Visit: Payer: Self-pay | Admitting: Surgery

## 2019-03-26 ENCOUNTER — Other Ambulatory Visit: Payer: Self-pay

## 2019-03-26 NOTE — Telephone Encounter (Signed)
Andrea [name] with [facility]-Dr. Trevor Mace office is calling stating that patient is having a Left breast Partial mastectomy on 04/01/19 and having a Radioactive Seed placed on 03/31/19. PreOp is saying they need to get direction from you regarding holding patient's Eliquis. They want to know if patient can start HOLDING her Eliquis on Saturday 03/29/19. Please Advise.

## 2019-03-26 NOTE — Telephone Encounter (Signed)
April with [facility] Notified and agreed.

## 2019-03-26 NOTE — Telephone Encounter (Signed)
Yes.  Andrea [name] should take her eliquis as usual through 03/28/19, then hold all doses on 5/9, 5/10, 5/11 (for the seed implant) and the day of her surgery 5/12.  She may resume postoperatively as soon as surgery is comfortable with her resuming.

## 2019-03-27 ENCOUNTER — Inpatient Hospital Stay: Payer: Medicare Other | Attending: Hematology and Oncology

## 2019-03-27 ENCOUNTER — Other Ambulatory Visit: Payer: Self-pay

## 2019-03-27 ENCOUNTER — Encounter (HOSPITAL_BASED_OUTPATIENT_CLINIC_OR_DEPARTMENT_OTHER)
Admission: RE | Admit: 2019-03-27 | Discharge: 2019-03-27 | Disposition: A | Discharge status: Home / Self Care | Payer: Medicare Other | Source: Ambulatory Visit | Attending: Surgery | Admitting: Surgery

## 2019-03-27 ENCOUNTER — Ambulatory Visit (HOSPITAL_COMMUNITY)
Admission: RE | Admit: 2019-03-27 | Discharge: 2019-03-27 | Disposition: A | Discharge status: Home / Self Care | Payer: Medicare Other | Source: Ambulatory Visit | Attending: Hematology and Oncology | Admitting: Hematology and Oncology

## 2019-03-27 ENCOUNTER — Telehealth: Payer: Self-pay | Admitting: *Deleted

## 2019-03-27 DIAGNOSIS — I959 Hypotension, unspecified: Secondary | ICD-10-CM | POA: Diagnosis not present

## 2019-03-27 DIAGNOSIS — C50412 Malignant neoplasm of upper-outer quadrant of left female breast: Secondary | ICD-10-CM | POA: Insufficient documentation

## 2019-03-27 DIAGNOSIS — Z170 Estrogen receptor positive status [ER+]: Secondary | ICD-10-CM | POA: Diagnosis not present

## 2019-03-27 DIAGNOSIS — I358 Other nonrheumatic aortic valve disorders: Secondary | ICD-10-CM | POA: Diagnosis not present

## 2019-03-27 DIAGNOSIS — R0602 Shortness of breath: Secondary | ICD-10-CM | POA: Diagnosis not present

## 2019-03-27 DIAGNOSIS — I4891 Unspecified atrial fibrillation: Secondary | ICD-10-CM | POA: Diagnosis not present

## 2019-03-27 DIAGNOSIS — G4730 Sleep apnea, unspecified: Secondary | ICD-10-CM | POA: Diagnosis not present

## 2019-03-27 LAB — BASIC METABOLIC PANEL
Anion gap: 8 (ref 5–15)
BUN: 20 mg/dL (ref 8–23)
CO2: 27 mmol/L (ref 22–32)
Calcium: 9.4 mg/dL (ref 8.9–10.3)
Chloride: 105 mmol/L (ref 98–111)
Creatinine, Ser: 0.85 mg/dL (ref 0.44–1.00)
GFR calc Af Amer: >60 mL/min (ref >60)
GFR calc non Af Amer: >60 mL/min (ref >60)
Glucose, Bld: 101 mg/dL — ABNORMAL HIGH (ref 70–99)
Potassium: 4.5 mmol/L (ref 3.5–5.1)
Sodium: 140 mmol/L (ref 135–145)

## 2019-03-27 MED ORDER — PROCHLORPERAZINE MALEATE 10 MG PO TABS: 10.0000 mg | ORAL_TABLET | Freq: Four times a day (QID) | ORAL | 0 refills | Status: DC | PRN

## 2019-03-27 NOTE — Progress Notes (Signed)
Ensure pre surgery drink given with instructions to complete by [place], surgical soap given with instructions, pt verbalized understanding.

## 2019-03-27 NOTE — Progress Notes (Signed)
  Echocardiogram 2D Echocardiogram has been performed.  Andrea [name] M 03/27/2019, 10:42 AM

## 2019-03-27 NOTE — Telephone Encounter (Signed)
Telephone teaching done with pt & daughter.  They came by when she was here for ECHO to pick up pt ed packet.  Encouraged to call for questions.

## 2019-03-28 ENCOUNTER — Other Ambulatory Visit (HOSPITAL_COMMUNITY)
Admission: RE | Admit: 2019-03-28 | Discharge: 2019-03-28 | Disposition: A | Discharge status: Home / Self Care | Payer: Medicare Other | Source: Ambulatory Visit | Attending: Surgery | Admitting: Surgery

## 2019-03-28 ENCOUNTER — Other Ambulatory Visit: Payer: Self-pay

## 2019-03-28 DIAGNOSIS — Z1159 Encounter for screening for other viral diseases: Secondary | ICD-10-CM | POA: Diagnosis not present

## 2019-03-29 LAB — NOVEL CORONAVIRUS, NAA (HOSP ORDER, SEND-OUT TO REF LAB; TAT 18-24 HRS): SARS-CoV-2, NAA: NOT DETECTED

## 2019-03-31 ENCOUNTER — Other Ambulatory Visit: Payer: Self-pay | Admitting: Surgery

## 2019-03-31 ENCOUNTER — Ambulatory Visit
Admission: RE | Admit: 2019-03-31 | Discharge: 2019-03-31 | Disposition: A | Discharge status: Home / Self Care | Payer: Medicare Other | Source: Ambulatory Visit | Attending: Surgery | Admitting: Surgery

## 2019-03-31 ENCOUNTER — Other Ambulatory Visit: Payer: Self-pay

## 2019-03-31 DIAGNOSIS — Z853 Personal history of malignant neoplasm of breast: Secondary | ICD-10-CM

## 2019-03-31 NOTE — H&P (Signed)
Andrea [name] Documented: 03/10/2019 10:12 AM Location: [facility] Patient #: [id number] DOB: 07-07-1935 Single / Language: Andrea [name] / Race: White Female   History of Present Illness (Andrea [name] A. Ninfa Linden MD; 03/10/2019 10:33 AM) The patient is a 84 year old female who presents with breast cancer. This is an 84 year old female referred by Andrea [name] AGNP after recent diagnosis of a left breast cancer. The patient had not had a mammogram since 2013. She presented with pain in the left breast. She has also had some clear nipple discharge. She underwent mammograms and her ultrasound showing her to have a 1.7 cm mass in the left breast at the 12 o'clock position retroareolar. There were no enlarged lymph nodes on the studies. She underwent a stereotactic biopsy which showed both invasive and in situ ductal carcinoma. It was 90% estrogen and 70% progesterone receptor positive. Ki-67 was 10%. She was also HER-2 positive. There is no family history of breast cancer. She has had multiple benign biopsies in the past. She is on blood thinning medication secondary to a history of DVTs and A. fib. Today, she is accompanied by her daughter. She denies chest pain or shortness of breath.   Past Surgical History Andrea [name], CMA; 03/10/2019 10:12 AM) Breast Biopsy  Left. Cataract Surgery  Bilateral. Foot Surgery  Bilateral. Knee Surgery  Bilateral. Oral Surgery  Spinal Surgery - Lower Back  Spinal Surgery - Neck  Tonsillectomy  Ventral / Umbilical Hernia Surgery  Bilateral.  Diagnostic Studies History Andrea [name], CMA; 03/10/2019 10:12 AM) Colonoscopy  never Pap Smear  >5 years ago  Allergies Andrea [name], CMA; 03/10/2019 10:14 AM) Iodinated Contrast Media  Clindamycin HCl *ANTI-INFECTIVE AGENTS - MISC.*  Allergies Reconciled   Medication History (Sabrina Canty, CMA; 03/10/2019 10:15 AM) oxyCODONE-Acetaminophen (10-325MG Tablet, Oral)  Active. clonazePAM (0.5MG Tablet, Oral) Active. busPIRone HCl (5MG Tablet, Oral) Active. Eliquis (5MG Tablet, Oral) Active. Furosemide (40MG Tablet, Oral) Active. Levothyroxine Sodium (50MCG Tablet, Oral) Active. Mirtazapine (15MG Tablet, Oral) Active. Myrbetriq (50MG Tablet ER 24HR, Oral) Active. Medications Reconciled  Social History Andrea [name], CMA; 03/10/2019 10:12 AM) Alcohol use  Occasional alcohol use. Caffeine use  Coffee. No drug use  Tobacco use  Current every day smoker.  Family History Andrea [name], [place]; 03/10/2019 10:12 AM) Arthritis  Daughter, Father, Mother, Sister. Depression  Son. Diabetes Mellitus  Daughter, Son. Heart Disease  Father, Son. Migraine Headache  Mother.  Pregnancy / Birth History Andrea [name], Andrea; 03/10/2019 10:12 AM) Age at menarche  59 years. Age of menopause  <47 Gravida  3 Para  3  Other Problems Andrea [name], CMA; 03/10/2019 10:12 AM) Anxiety Disorder  Arthritis  Atrial Fibrillation  Back Pain  Bladder Problems  Chest pain  Depression  Lump In Breast  Migraine Headache  Pulmonary Embolism / Blood Clot in Legs  Thyroid Disease  Umbilical Hernia Repair     Review of Systems (Sabrina Canty CMA; 03/10/2019 10:12 AM) General Not Present- Appetite Loss, Chills, Fatigue, Fever, Night Sweats, Weight Gain and Weight Loss. Skin Not Present- Change in Wart/Mole, Dryness, Hives, Jaundice, New Lesions, Non-Healing Wounds, Rash and Ulcer. HEENT Not Present- Earache, Hearing Loss, Hoarseness, Nose Bleed, Oral Ulcers, Ringing in the Ears, Seasonal Allergies, Sinus Pain, Sore Throat, Visual Disturbances, Wears glasses/contact lenses and Yellow Eyes. Respiratory Not Present- Bloody sputum, Chronic Cough, Difficulty Breathing, Snoring and Wheezing. Breast Present- Breast Mass, Breast Pain, Nipple Discharge and Skin Changes. Cardiovascular Not Present- Chest Pain, Difficulty Breathing Lying Down, Leg Cramps,  Palpitations, Rapid  Heart Rate, Shortness of Breath and Swelling of Extremities. Gastrointestinal Not Present- Abdominal Pain, Bloating, Bloody Stool, Change in Bowel Habits, Chronic diarrhea, Constipation, Difficulty Swallowing, Excessive gas, Gets full quickly at meals, Hemorrhoids, Indigestion, Nausea, Rectal Pain and Vomiting. Female Genitourinary Present- Frequency, Nocturia and Urgency. Not Present- Painful Urination and Pelvic Pain. Musculoskeletal Present- Back Pain and Muscle Weakness. Not Present- Joint Pain, Joint Stiffness, Muscle Pain and Swelling of Extremities. Psychiatric Present- Anxiety and Depression. Not Present- Bipolar, Change in Sleep Pattern, Fearful and Frequent crying. Hematology Present- Blood Thinners. Not Present- Easy Bruising, Excessive bleeding, Gland problems, HIV and Persistent Infections.  Vitals (Sabrina Canty CMA; 03/10/2019 10:16 AM) 03/10/2019 10:15 AM Weight: 201.4 lb Height: 61in Body Surface Area: 1.89 m Body Mass Index: 38.05 kg/m  Temp.: 97.9F(Oral)  Pulse: 97 (Regular)  BP: 108/68 (Sitting, Left Arm, Standard)       Physical Exam (Gilmer Kaminsky A. Ninfa Linden MD; 03/10/2019 10:33 AM) General Mental Status-Alert. General Appearance-Consistent with stated age. Hydration-Well hydrated. Voice-Normal.  Head and Neck Head-normocephalic, atraumatic with no lesions or palpable masses. Trachea-midline. Thyroid Gland Characteristics - normal size and consistency.  Eye Eyeball - Bilateral-Extraocular movements intact. Sclera/Conjunctiva - Bilateral-No scleral icterus.  Chest and Lung Exam Chest and lung exam reveals -quiet, even and easy respiratory effort with no use of accessory muscles and on auscultation, normal breath sounds, no adventitious sounds and normal vocal resonance. Inspection Chest Wall - Normal. Back - normal.  Breast Breast - Left-Symmetric, Non Tender, No Biopsy scars, no Dimpling, No Inflammation,  No Lumpectomy scars, No Mastectomy scars, No Peau d' Orange. Note: There is a mobile mass subareolar in the left breast. It is mobile. There is mild ecchymosis from the biopsy. There are no enlarged lymph nodes. Breast - Right-Symmetric, Non Tender, No Biopsy scars, no Dimpling, No Inflammation, No Lumpectomy scars, No Mastectomy scars, No Peau d' Orange.  Cardiovascular Cardiovascular examination reveals -normal heart sounds, regular rate and rhythm with no murmurs and normal pedal pulses bilaterally.  Abdomen Inspection Inspection of the abdomen reveals - No Hernias. Skin - Scar - no surgical scars. Palpation/Percussion Palpation and Percussion of the abdomen reveal - Soft, Non Tender, No Rebound tenderness, No Rigidity (guarding) and No hepatosplenomegaly. Auscultation Auscultation of the abdomen reveals - Bowel sounds normal.  Neurologic - Did not examine.  Musculoskeletal - Did not examine.  Lymphatic Head & Neck  General Head & Neck Lymphatics: Bilateral - Description - Normal. Axillary  General Axillary Region: Bilateral - Description - Normal. Tenderness - Non Tender. Femoral & Inguinal - Did not examine.    Assessment & Plan (Shyonna Carlin A. Ninfa Linden MD; 03/10/2019 10:36 AM) BREAST CANCER, LEFT (C50.912) Impression: I have reviewed her mammograms, ultrasound, and pathology results. I gave the patient and her daughter a copy of the pathology report. This is an ER and PR positive, HER-2 positive left breast cancer. We discussed breast conservation with a partial mastectomy and sentinel node biopsy followed by radiation versus mastectomy. We discussed preoperative referrals to both medical and radiation oncology. We discussed that surgery may be delayed secondary to COVID -19 after discussing whether or not she can be placed on anti-chemotherapy preoperatively. She is very much interested in proceeding with surgery as soon as possible. We would also need cardiac clearance and  decisions regarding her blood thinning medication made preoperatively. I will discuss things further after she has seen oncology.  Addendum: After meeting with oncology, wee will proceed with  With a left breast partial mastectomy with RSL and  left axillary sentinel lymph node biopsy as well as port insertion

## 2019-03-31 NOTE — Anesthesia Preprocedure Evaluation (Addendum)
Anesthesia Evaluation  Patient identified by MRN, date of birth, ID band Patient awake    Reviewed: Allergy & Precautions, NPO status , Patient's Chart, lab work & pertinent test results  History of Anesthesia Complications Negative for: history of anesthetic complications  Airway Mallampati: II  TM Distance: >3 FB Neck ROM: Full    Dental  (+) Lower Dentures, Upper Dentures   Pulmonary sleep apnea , Current Smoker,    breath sounds clear to auscultation       Cardiovascular (-) hypertension (denies)+ DVT  + dysrhythmias Atrial Fibrillation  Rhythm:Regular Rate:Normal   '20 TTE - EF 60-65%. Mildly increased left ventricular wall thickness. AV sclerosis without any evidence of stenosis.    Neuro/Psych  Headaches, PSYCHIATRIC DISORDERS Anxiety Depression Bipolar Disorder    GI/Hepatic negative GI ROS, Neg liver ROS,   Endo/Other  diabetesHypothyroidism  Obesity   Renal/GU negative Renal ROS  Female GU complaint     Musculoskeletal  (+) Arthritis ,   Abdominal   Peds  Hematology negative hematology ROS (+)   Anesthesia Other Findings   Reproductive/Obstetrics  Breast cancer                             Anesthesia Physical Anesthesia Plan  ASA: III  Anesthesia Plan: General   Post-op Pain Management:  Regional for Post-op pain   Induction: Intravenous  PONV Risk Score and Plan: 3 and Treatment may vary due to age or medical condition, Ondansetron and Dexamethasone  Airway Management Planned: LMA  Additional Equipment: None  Intra-op Plan:   Post-operative Plan: Extubation in OR  Informed Consent: I have reviewed the patients History and Physical, chart, labs and discussed the procedure including the risks, benefits and alternatives for the proposed anesthesia with the patient or authorized representative who has indicated his/her understanding and acceptance.   Patient has  DNR.   Dental advisory given  Plan Discussed with: CRNA and Anesthesiologist  Anesthesia Plan Comments:        Anesthesia Quick Evaluation

## 2019-04-01 ENCOUNTER — Ambulatory Visit (HOSPITAL_BASED_OUTPATIENT_CLINIC_OR_DEPARTMENT_OTHER)
Admission: RE | Admit: 2019-04-01 | Discharge: 2019-04-01 | Disposition: A | Discharge status: Home / Self Care | Payer: Medicare Other | Attending: Surgery | Admitting: Surgery

## 2019-04-01 ENCOUNTER — Ambulatory Visit (HOSPITAL_BASED_OUTPATIENT_CLINIC_OR_DEPARTMENT_OTHER): Payer: Medicare Other | Admitting: Anesthesiology

## 2019-04-01 ENCOUNTER — Encounter (HOSPITAL_BASED_OUTPATIENT_CLINIC_OR_DEPARTMENT_OTHER): Payer: Self-pay | Admitting: Certified Registered"

## 2019-04-01 ENCOUNTER — Encounter (HOSPITAL_COMMUNITY)
Admission: RE | Admit: 2019-04-01 | Discharge: 2019-04-01 | Disposition: A | Discharge status: Home / Self Care | Payer: Medicare Other | Source: Ambulatory Visit | Attending: Surgery | Admitting: Surgery

## 2019-04-01 ENCOUNTER — Encounter (HOSPITAL_BASED_OUTPATIENT_CLINIC_OR_DEPARTMENT_OTHER)
Admission: RE | Disposition: A | Discharge status: Home / Self Care | Payer: Self-pay | Source: Home / Self Care | Attending: Surgery

## 2019-04-01 ENCOUNTER — Other Ambulatory Visit: Payer: Self-pay

## 2019-04-01 ENCOUNTER — Ambulatory Visit (HOSPITAL_COMMUNITY): Payer: Medicare Other

## 2019-04-01 ENCOUNTER — Ambulatory Visit
Admission: RE | Admit: 2019-04-01 | Discharge: 2019-04-01 | Disposition: A | Discharge status: Home / Self Care | Payer: Medicare Other | Source: Ambulatory Visit | Attending: Surgery | Admitting: Surgery

## 2019-04-01 DIAGNOSIS — Z86711 Personal history of pulmonary embolism: Secondary | ICD-10-CM | POA: Diagnosis not present

## 2019-04-01 DIAGNOSIS — Z79899 Other long term (current) drug therapy: Secondary | ICD-10-CM | POA: Insufficient documentation

## 2019-04-01 DIAGNOSIS — Z853 Personal history of malignant neoplasm of breast: Secondary | ICD-10-CM

## 2019-04-01 DIAGNOSIS — Z950 Presence of cardiac pacemaker: Secondary | ICD-10-CM | POA: Diagnosis not present

## 2019-04-01 DIAGNOSIS — Z86718 Personal history of other venous thrombosis and embolism: Secondary | ICD-10-CM | POA: Insufficient documentation

## 2019-04-01 DIAGNOSIS — G4730 Sleep apnea, unspecified: Secondary | ICD-10-CM | POA: Diagnosis not present

## 2019-04-01 DIAGNOSIS — F419 Anxiety disorder, unspecified: Secondary | ICD-10-CM | POA: Diagnosis not present

## 2019-04-01 DIAGNOSIS — Z170 Estrogen receptor positive status [ER+]: Secondary | ICD-10-CM | POA: Insufficient documentation

## 2019-04-01 DIAGNOSIS — E039 Hypothyroidism, unspecified: Secondary | ICD-10-CM | POA: Insufficient documentation

## 2019-04-01 DIAGNOSIS — Z79890 Hormone replacement therapy (postmenopausal): Secondary | ICD-10-CM | POA: Diagnosis not present

## 2019-04-01 DIAGNOSIS — Z6837 Body mass index (BMI) 37.0-37.9, adult: Secondary | ICD-10-CM | POA: Diagnosis not present

## 2019-04-01 DIAGNOSIS — C50412 Malignant neoplasm of upper-outer quadrant of left female breast: Secondary | ICD-10-CM | POA: Diagnosis not present

## 2019-04-01 DIAGNOSIS — F17200 Nicotine dependence, unspecified, uncomplicated: Secondary | ICD-10-CM | POA: Diagnosis not present

## 2019-04-01 DIAGNOSIS — F319 Bipolar disorder, unspecified: Secondary | ICD-10-CM | POA: Diagnosis not present

## 2019-04-01 DIAGNOSIS — M1990 Unspecified osteoarthritis, unspecified site: Secondary | ICD-10-CM | POA: Insufficient documentation

## 2019-04-01 DIAGNOSIS — E669 Obesity, unspecified: Secondary | ICD-10-CM | POA: Diagnosis not present

## 2019-04-01 DIAGNOSIS — Z538 Procedure and treatment not carried out for other reasons: Secondary | ICD-10-CM | POA: Diagnosis not present

## 2019-04-01 DIAGNOSIS — G8918 Other acute postprocedural pain: Secondary | ICD-10-CM | POA: Diagnosis not present

## 2019-04-01 DIAGNOSIS — Z66 Do not resuscitate: Secondary | ICD-10-CM | POA: Insufficient documentation

## 2019-04-01 DIAGNOSIS — I4891 Unspecified atrial fibrillation: Secondary | ICD-10-CM | POA: Insufficient documentation

## 2019-04-01 DIAGNOSIS — Z7901 Long term (current) use of anticoagulants: Secondary | ICD-10-CM | POA: Insufficient documentation

## 2019-04-01 DIAGNOSIS — C50912 Malignant neoplasm of unspecified site of left female breast: Secondary | ICD-10-CM | POA: Insufficient documentation

## 2019-04-01 DIAGNOSIS — Z452 Encounter for adjustment and management of vascular access device: Secondary | ICD-10-CM

## 2019-04-01 DIAGNOSIS — I10 Essential (primary) hypertension: Secondary | ICD-10-CM | POA: Diagnosis not present

## 2019-04-01 HISTORY — DX: Malignant (primary) neoplasm, unspecified: C80.1

## 2019-04-01 HISTORY — DX: Unspecified atrial fibrillation: I48.91

## 2019-04-01 HISTORY — DX: Unspecified urinary incontinence: R32

## 2019-04-01 HISTORY — PX: RADIOACTIVE SEED GUIDED PARTIAL MASTECTOMY WITH AXILLARY SENTINEL LYMPH NODE BIOPSY: SHX6520

## 2019-04-01 HISTORY — PX: PORTACATH PLACEMENT: SHX2246

## 2019-04-01 SURGERY — RADIOACTIVE SEED GUIDED PARTIAL MASTECTOMY WITH AXILLARY SENTINEL LYMPH NODE BIOPSY
Anesthesia: General | Site: Breast | Laterality: Right

## 2019-04-01 MED ORDER — HEPARIN SOD (PORK) LOCK FLUSH 100 UNIT/ML IV SOLN
INTRAVENOUS | Status: AC
  Filled 2019-04-01: qty 5

## 2019-04-01 MED ORDER — ACETAMINOPHEN 500 MG PO TABS
1000.0000 mg | ORAL_TABLET | ORAL | Status: AC
  Administered 2019-04-01: 07:00:00 1000 mg via ORAL

## 2019-04-01 MED ORDER — OXYCODONE HCL 5 MG/5ML PO SOLN: 5.0000 mg | Freq: Once | ORAL | Status: DC | PRN

## 2019-04-01 MED ORDER — LIDOCAINE HCL (CARDIAC) PF 100 MG/5ML IV SOSY
PREFILLED_SYRINGE | INTRAVENOUS | Status: DC | PRN
  Administered 2019-04-01: 30 mg via INTRAVENOUS

## 2019-04-01 MED ORDER — EPHEDRINE SULFATE 50 MG/ML IJ SOLN
INTRAMUSCULAR | Status: DC | PRN
  Administered 2019-04-01: 10 mg via INTRAVENOUS

## 2019-04-01 MED ORDER — ACETAMINOPHEN 500 MG PO TABS
ORAL_TABLET | ORAL | Status: AC
  Filled 2019-04-01: qty 2

## 2019-04-01 MED ORDER — PROPOFOL 10 MG/ML IV BOLUS
INTRAVENOUS | Status: AC
  Filled 2019-04-01: qty 20

## 2019-04-01 MED ORDER — GABAPENTIN 300 MG PO CAPS
ORAL_CAPSULE | ORAL | Status: AC
  Filled 2019-04-01: qty 1

## 2019-04-01 MED ORDER — CEFAZOLIN SODIUM-DEXTROSE 2-4 GM/100ML-% IV SOLN
2.0000 g | INTRAVENOUS | Status: AC
  Administered 2019-04-01: 2 g via INTRAVENOUS

## 2019-04-01 MED ORDER — FENTANYL CITRATE (PF) 100 MCG/2ML IJ SOLN
50.0000 ug | INTRAMUSCULAR | Status: DC | PRN
  Administered 2019-04-01: 07:00:00 50 ug via INTRAVENOUS

## 2019-04-01 MED ORDER — PROPOFOL 500 MG/50ML IV EMUL
INTRAVENOUS | Status: DC | PRN
  Administered 2019-04-01: 25 ug/kg/min via INTRAVENOUS

## 2019-04-01 MED ORDER — SODIUM BICARBONATE 4.2 % IV SOLN
INTRAVENOUS | Status: AC
  Filled 2019-04-01: qty 10

## 2019-04-01 MED ORDER — SCOPOLAMINE 1 MG/3DAYS TD PT72: 1.0000 | MEDICATED_PATCH | Freq: Once | TRANSDERMAL | Status: DC | PRN

## 2019-04-01 MED ORDER — PROPOFOL 10 MG/ML IV BOLUS
INTRAVENOUS | Status: DC | PRN
  Administered 2019-04-01: 200 mg via INTRAVENOUS

## 2019-04-01 MED ORDER — FENTANYL CITRATE (PF) 100 MCG/2ML IJ SOLN
INTRAMUSCULAR | Status: AC
  Filled 2019-04-01: qty 2

## 2019-04-01 MED ORDER — TECHNETIUM TC 99M SULFUR COLLOID FILTERED
1.0000 | Freq: Once | INTRAVENOUS | Status: AC | PRN
  Administered 2019-04-01: 1 via INTRADERMAL

## 2019-04-01 MED ORDER — FENTANYL CITRATE (PF) 100 MCG/2ML IJ SOLN
INTRAMUSCULAR | Status: DC | PRN
  Administered 2019-04-01: 50 ug via INTRAVENOUS

## 2019-04-01 MED ORDER — FENTANYL CITRATE (PF) 100 MCG/2ML IJ SOLN
25.0000 ug | INTRAMUSCULAR | Status: DC | PRN
  Administered 2019-04-01: 09:00:00 25 ug via INTRAVENOUS

## 2019-04-01 MED ORDER — BUPIVACAINE-EPINEPHRINE (PF) 0.5% -1:200000 IJ SOLN
INTRAMUSCULAR | Status: DC | PRN
  Administered 2019-04-01: 30 mL

## 2019-04-01 MED ORDER — CHLORHEXIDINE GLUCONATE CLOTH 2 % EX PADS: 6.0000 | MEDICATED_PAD | Freq: Once | CUTANEOUS | Status: DC

## 2019-04-01 MED ORDER — OXYCODONE HCL 5 MG PO TABS: 5.0000 mg | ORAL_TABLET | Freq: Once | ORAL | Status: DC | PRN

## 2019-04-01 MED ORDER — LIDOCAINE HCL (PF) 1 % IJ SOLN
INTRAMUSCULAR | Status: AC
  Filled 2019-04-01: qty 30

## 2019-04-01 MED ORDER — CEFAZOLIN SODIUM-DEXTROSE 2-4 GM/100ML-% IV SOLN
INTRAVENOUS | Status: AC
  Filled 2019-04-01: qty 100

## 2019-04-01 MED ORDER — ONDANSETRON HCL 4 MG/2ML IJ SOLN: 4.0000 mg | Freq: Once | INTRAMUSCULAR | Status: DC | PRN

## 2019-04-01 MED ORDER — TRAMADOL HCL 50 MG PO TABS: 50.0000 mg | ORAL_TABLET | Freq: Four times a day (QID) | ORAL | 0 refills | Status: DC | PRN

## 2019-04-01 MED ORDER — ONDANSETRON HCL 4 MG/2ML IJ SOLN
INTRAMUSCULAR | Status: DC | PRN
  Administered 2019-04-01: 4 mg via INTRAVENOUS

## 2019-04-01 MED ORDER — PHENYLEPHRINE HCL (PRESSORS) 10 MG/ML IV SOLN
INTRAVENOUS | Status: DC | PRN
  Administered 2019-04-01 (×2): 40 ug via INTRAVENOUS

## 2019-04-01 MED ORDER — BUPIVACAINE-EPINEPHRINE (PF) 0.5% -1:200000 IJ SOLN
INTRAMUSCULAR | Status: AC
  Filled 2019-04-01: qty 60

## 2019-04-01 MED ORDER — METHYLENE BLUE 0.5 % INJ SOLN
INTRAVENOUS | Status: AC
  Filled 2019-04-01: qty 10

## 2019-04-01 MED ORDER — DEXAMETHASONE SODIUM PHOSPHATE 4 MG/ML IJ SOLN
INTRAMUSCULAR | Status: DC | PRN
  Administered 2019-04-01: 4 mg via INTRAVENOUS

## 2019-04-01 MED ORDER — MIDAZOLAM HCL 2 MG/2ML IJ SOLN: 1.0000 mg | INTRAMUSCULAR | Status: DC | PRN

## 2019-04-01 MED ORDER — BUPIVACAINE-EPINEPHRINE 0.5% -1:200000 IJ SOLN
INTRAMUSCULAR | Status: DC | PRN
  Administered 2019-04-01: 15 mL

## 2019-04-01 MED ORDER — MIDAZOLAM HCL 2 MG/2ML IJ SOLN
INTRAMUSCULAR | Status: AC
  Filled 2019-04-01: qty 2

## 2019-04-01 MED ORDER — GABAPENTIN 300 MG PO CAPS
300.0000 mg | ORAL_CAPSULE | ORAL | Status: AC
  Administered 2019-04-01: 07:00:00 300 mg via ORAL

## 2019-04-01 MED ORDER — LACTATED RINGERS IV SOLN
INTRAVENOUS | Status: DC
  Administered 2019-04-01: 07:00:00 via INTRAVENOUS

## 2019-04-01 SURGICAL SUPPLY — 52 items
APPLIER CLIP 9.375 MED OPEN (MISCELLANEOUS) ×4
BAG DECANTER FOR FLEXI CONT (MISCELLANEOUS) ×4 IMPLANT
BINDER BREAST XLRG (GAUZE/BANDAGES/DRESSINGS) ×4 IMPLANT
BLADE HEX COATED 2.75 (ELECTRODE) ×4 IMPLANT
BLADE SURG 15 STRL LF DISP TIS (BLADE) ×2 IMPLANT
BLADE SURG 15 STRL SS (BLADE) ×2
CANISTER SUCT 1200ML W/VALVE (MISCELLANEOUS) ×4 IMPLANT
CHLORAPREP W/TINT 26 (MISCELLANEOUS) ×4 IMPLANT
CLIP APPLIE 9.375 MED OPEN (MISCELLANEOUS) ×2 IMPLANT
CLIP VESOCCLUDE SM WIDE 6/CT (CLIP) IMPLANT
COVER BACK TABLE REUSABLE LG (DRAPES) ×4 IMPLANT
COVER MAYO STAND REUSABLE (DRAPES) ×4 IMPLANT
COVER PROBE W GEL 5X96 (DRAPES) ×4 IMPLANT
COVER WAND RF STERILE (DRAPES) ×4 IMPLANT
DECANTER SPIKE VIAL GLASS SM (MISCELLANEOUS) ×4 IMPLANT
DERMABOND ADVANCED (GAUZE/BANDAGES/DRESSINGS) ×4
DERMABOND ADVANCED .7 DNX12 (GAUZE/BANDAGES/DRESSINGS) ×4 IMPLANT
DRAPE C-ARM 42X72 X-RAY (DRAPES) ×4 IMPLANT
DRAPE LAPAROSCOPIC ABDOMINAL (DRAPES) ×4 IMPLANT
DRAPE UTILITY XL STRL (DRAPES) ×4 IMPLANT
ELECT REM PT RETURN 9FT ADLT (ELECTROSURGICAL) ×4
ELECTRODE REM PT RTRN 9FT ADLT (ELECTROSURGICAL) ×2 IMPLANT
GAUZE SPONGE 4X4 12PLY STRL LF (GAUZE/BANDAGES/DRESSINGS) IMPLANT
GLOVE SURG SIGNA 7.5 PF LTX (GLOVE) ×4 IMPLANT
GOWN STRL REUS W/ TWL LRG LVL3 (GOWN DISPOSABLE) ×2 IMPLANT
GOWN STRL REUS W/ TWL XL LVL3 (GOWN DISPOSABLE) ×2 IMPLANT
GOWN STRL REUS W/TWL LRG LVL3 (GOWN DISPOSABLE) ×2
GOWN STRL REUS W/TWL XL LVL3 (GOWN DISPOSABLE) ×2
IV KIT MINILOC 20X1 SAFETY (NEEDLE) IMPLANT
KIT MARKER MARGIN INK (KITS) ×4 IMPLANT
KIT PORT POWER 8FR ISP CVUE (Port) IMPLANT
NDL SAFETY ECLIPSE 18X1.5 (NEEDLE) ×2 IMPLANT
NEEDLE HYPO 18GX1.5 SHARP (NEEDLE) ×2
NEEDLE HYPO 25X1 1.5 SAFETY (NEEDLE) ×4 IMPLANT
NS IRRIG 1000ML POUR BTL (IV SOLUTION) ×4 IMPLANT
PACK BASIN DAY SURGERY FS (CUSTOM PROCEDURE TRAY) ×4 IMPLANT
PENCIL BUTTON HOLSTER BLD 10FT (ELECTRODE) ×4 IMPLANT
SLEEVE SCD COMPRESS KNEE MED (MISCELLANEOUS) ×4 IMPLANT
SPONGE LAP 4X18 RFD (DISPOSABLE) ×4 IMPLANT
SUT MNCRL AB 4-0 PS2 18 (SUTURE) ×4 IMPLANT
SUT PROLENE 2 0 SH DA (SUTURE) ×4 IMPLANT
SUT SILK 2 0 SH (SUTURE) IMPLANT
SUT SILK 2 0 TIES 17X18 (SUTURE)
SUT SILK 2-0 18XBRD TIE BLK (SUTURE) IMPLANT
SUT VIC AB 3-0 SH 27 (SUTURE) ×2
SUT VIC AB 3-0 SH 27X BRD (SUTURE) ×2 IMPLANT
SYR CONTROL 10ML LL (SYRINGE) ×4 IMPLANT
TOWEL GREEN STERILE FF (TOWEL DISPOSABLE) ×4 IMPLANT
TRAY FAXITRON CT DISP (TRAY / TRAY PROCEDURE) ×4 IMPLANT
TUBE CONNECTING 20'X1/4 (TUBING) ×1
TUBE CONNECTING 20X1/4 (TUBING) ×3 IMPLANT
YANKAUER SUCT BULB TIP NO VENT (SUCTIONS) IMPLANT

## 2019-04-01 NOTE — Interval H&P Note (Signed)
History and Physical Interval Note:no change in H and P  04/01/2019 7:04 AM  Andrea [name]  has presented today for surgery, with the diagnosis of LEFT BREAST CANCER.  The various methods of treatment have been discussed with the patient and family. After consideration of risks, benefits and other options for treatment, the patient has consented to  Procedure(s): LEFT BREAST PARTIAL MASTECTOMY WITH RADIOACTIVE SEED AND LEFT SENTINEL LYMPH NODE BIOPSY (Left) INSERTION PORT-A-CATH WITH ULTRASOUND (N/A) as a surgical intervention.  The patient's history has been reviewed, patient examined, no change in status, stable for surgery.  I have reviewed the patient's chart and labs.  Questions were answered to the patient's satisfaction.     [name]

## 2019-04-01 NOTE — Transfer of Care (Signed)
Immediate Anesthesia Transfer of Care Note  Patient: Andrea [name]  Procedure(s) Performed: LEFT BREAST PARTIAL MASTECTOMY WITH RADIOACTIVE SEED AND LEFT SENTINEL LYMPH NODE BIOPSY (Left Breast) ATTEMPTED INSERTION PORT-A-CATH WITH ULTRASOUND (Right )  Patient Location: PACU  Anesthesia Type:GA combined with regional for post-op pain  Level of Consciousness: drowsy and patient cooperative  Airway & Oxygen Therapy: Patient Spontanous Breathing and Patient connected to nasal cannula oxygen  Post-op Assessment: Report given to RN and Post -op Vital signs reviewed and stable  Post vital signs: Reviewed and stable  Last Vitals:  Vitals Value Taken Time  BP    Temp    Pulse    Resp    SpO2      Last Pain:  Vitals:   04/01/19 0635  TempSrc: Oral  PainSc: 0-No pain      Patients Stated Pain Goal: 1 (75/05/18 3358)  Complications: No apparent anesthesia complications

## 2019-04-01 NOTE — Progress Notes (Signed)
Assisted Dr. Fransisco Beau with left, ultrasound guided, pectoralis block. Side rails up, monitors on throughout procedure. See vital signs in flow sheet. Tolerated Procedure well.

## 2019-04-01 NOTE — Discharge Instructions (Signed)
Andrea [name] Office Phone Number [phone]  BREAST BIOPSY/ PARTIAL MASTECTOMY: POST OP INSTRUCTIONS  Always review your discharge instruction sheet given to you by the facility where your surgery was performed.  IF YOU HAVE DISABILITY OR FAMILY LEAVE FORMS, YOU MUST BRING THEM TO THE OFFICE FOR PROCESSING.  DO NOT GIVE THEM TO YOUR DOCTOR.  1. A prescription for pain medication may be given to you upon discharge.  Take your pain medication as prescribed, if needed.  If narcotic pain medicine is not needed, then you may take acetaminophen (Tylenol) or ibuprofen (Advil) as needed. 2. Take your usually prescribed medications unless otherwise directed 3. If you need a refill on your pain medication, please contact your pharmacy.  They will contact our office to request authorization.  Prescriptions will not be filled after 5pm or on week-ends. 4. You should eat very light the first 24 hours after surgery, such as soup, crackers, pudding, etc.  Resume your normal diet the day after surgery. 5. Most patients will experience some swelling and bruising in the breast.  Ice packs and a good support bra will help.  Swelling and bruising can take several days to resolve.  6. It is common to experience some constipation if taking pain medication after surgery.  Increasing fluid intake and taking a stool softener will usually help or prevent this problem from occurring.  A mild laxative (Milk of Magnesia or Miralax) should be taken according to package directions if there are no bowel movements after 48 hours. 7. Unless discharge instructions indicate otherwise, you may remove your bandages 24-48 hours after surgery, and you may shower at that time.  You may have steri-strips (small skin tapes) in place directly over the incision.  These strips should be left on the skin for 7-10 days.  If your surgeon used skin glue on the incision, you may shower in 24 hours.  The glue will flake off over the  next 2-3 weeks.  Any sutures or staples will be removed at the office during your follow-up visit. 8. ACTIVITIES:  You may resume regular daily activities (gradually increasing) beginning the next day.  Wearing a good support bra or sports bra minimizes pain and swelling.  You may have sexual intercourse when it is comfortable. a. You may drive when you no longer are taking prescription pain medication, you can comfortably wear a seatbelt, and you can safely maneuver your car and apply brakes. b. RETURN TO WORK:  ______________________________________________________________________________________ 9. You should see your doctor in the office for a follow-up appointment approximately two weeks after your surgery.  Your doctors nurse will typically make your follow-up appointment when she calls you with your pathology report.  Expect your pathology report 2-3 business days after your surgery.  You may call to check if you do not hear from [place] after three days. 10. OTHER INSTRUCTIONS:OK TO REMOVE BINDER TOMORROW AND SHOWER.  YOU ONLY NEED TO PUT THE BINDER BACK ON FOR COMFORT 11. ICE PACK, TYLENOL ALSO FOR PAIN _______________________________________________________________________________________________ _____________________________________________________________________________________________________________________________________ _____________________________________________________________________________________________________________________________________ _____________________________________________________________________________________________________________________________________  WHEN TO CALL YOUR DOCTOR: 1. Fever over 101.0 2. Nausea and/or vomiting. 3. Extreme swelling or bruising. 4. Continued bleeding from incision. 5. Increased pain, redness, or drainage from the incision.  The clinic staff is available to answer your questions during regular business hours.  Please dont  hesitate to call and ask to speak to one of the nurses for clinical concerns.  If you have a medical emergency, go to the nearest emergency room  or call 911.  A surgeon from [facility] is always on call at the hospital.  For further questions, please visit [email or web address]    Post Anesthesia Home Care Instructions  Activity: Get plenty of rest for the remainder of the day. A responsible individual must stay with you for 24 hours following the procedure.  For the next 24 hours, DO NOT: -Drive a car -Paediatric nurse -Drink alcoholic beverages -Take any medication unless instructed by your physician -Make any legal decisions or sign important papers.  Meals: Start with liquid foods such as gelatin or soup. Progress to regular foods as tolerated. Avoid greasy, spicy, heavy foods. If nausea and/or vomiting occur, drink only clear liquids until the nausea and/or vomiting subsides. Call your physician if vomiting continues.  Special Instructions/Symptoms: Your throat may feel dry or sore from the anesthesia or the breathing tube placed in your throat during surgery. If this causes discomfort, gargle with warm salt water. The discomfort should disappear within 24 hours.  If you had a scopolamine patch placed behind your ear for the management of post- operative nausea and/or vomiting:  1. The medication in the patch is effective for 72 hours, after which it should be removed.  Wrap patch in a tissue and discard in the trash. Wash hands thoroughly with soap and water. 2. You may remove the patch earlier than 72 hours if you experience unpleasant side effects which may include dry mouth, dizziness or visual disturbances. 3. Avoid touching the patch. Wash your hands with soap and water after contact with the patch.

## 2019-04-01 NOTE — Anesthesia Procedure Notes (Signed)
Anesthesia Regional Block: Pectoralis block   Pre-Anesthetic Checklist: ,, timeout performed, Correct Patient, Correct Site, Correct Laterality, Correct Procedure, Correct Position, site marked, Risks and benefits discussed,  Surgical consent,  Pre-op evaluation,  At surgeon's request and post-op pain management  Laterality: Left  Prep: chloraprep       Needles:  Injection technique: Single-shot  Needle Type: Echogenic Needle     Needle Length: 10cm  Needle Gauge: 21     Additional Needles:   Narrative:  Start time: 04/01/2019 7:01 AM End time: 04/01/2019 7:05 AM Injection made incrementally with aspirations every 5 mL.  Performed by: Personally  Anesthesiologist: Audry Pili, MD  Additional Notes: No pain on injection. No increased resistance to injection. Injection made in 5cc increments. Good needle visualization. Patient tolerated the procedure well.

## 2019-04-01 NOTE — Anesthesia Procedure Notes (Signed)
Procedure Name: LMA Insertion Date/Time: 04/01/2019 7:32 AM Performed by: Signe Colt, CRNA Pre-anesthesia Checklist: Patient identified, Emergency Drugs available, Suction available and Patient being monitored Patient Re-evaluated:Patient Re-evaluated prior to induction Oxygen Delivery Method: Circle system utilized Preoxygenation: Pre-oxygenation with 100% oxygen Induction Type: IV induction Ventilation: Mask ventilation without difficulty LMA: LMA inserted LMA Size: 3.0 Number of attempts: 1 Airway Equipment and Method: Bite block Placement Confirmation: positive ETCO2 Tube secured with: Tape Dental Injury: Teeth and Oropharynx as per pre-operative assessment

## 2019-04-01 NOTE — Op Note (Signed)
LEFT BREAST PARTIAL MASTECTOMY WITH RADIOACTIVE SEED AND LEFT SENTINEL LYMPH NODE BIOPSY, ATTEMPTED INSERTION PORT-A-CATH WITH ULTRASOUND  Procedure Note  Andrea [name] 04/01/2019   Pre-op Diagnosis: LEFT BREAST CANCER     Post-op Diagnosis: same  Procedure(s): LEFT BREAST PARTIAL MASTECTOMY WITH RADIOACTIVE SEED AND DEEP LEFT AXILLARY SENTINEL LYMPH NODE BIOPSY ATTEMPTED INSERTION PORT-A-CATH WITH ULTRASOUND  Surgeon(s): Andrea Keens, MD  Anesthesia: General  Staff:  Circulator: Philomena Doheny, RN Radiology Technologist: Elberta Spaniel, RT Scrub Person: Clarice Pole  Estimated Blood Loss: Minimal               Specimens: sent to path  Indications: This is an 85 year old female with a ER positive, PR positive, HER-2 positive left breast cancer.  The decision was made to proceed with a left breast partial mastectomy with sentinel lymph node biopsy as well as a Port-A-Cath insertion.  Findings: The radioactive seed and previous marker were confirmed to be in the partial mastectomy specimen.  2 sentinel lymph nodes appear to be removed.  I was unsuccessful at cannulating either the right subclavian vein or right IJ vein and was unable to place a Port-A-Cath.  Procedure: The patient was brought to the operating room and identified the correct patient.  She was placed upon the operating room table and general anesthesia was induced.  Her bilateral breast, chest, axilla, and neck were then prepped and draped in usual sterile fashion.  The patient was placed in the Trendelenburg position.  I anesthetized the skin on the right chest with Marcaine.  I attempted to cannulate the right subclavian vein.  The artery was cannulated on the first attempt.  I made multiple attempts at cannulating the left subclavian vein without success.  At this point I then anesthetized the neck.  Even with ultrasound, I could not cannulate the right internal jugular vein as well.  At this point,  small hematoma was developing in the neck.  The decision was thus made to forego port insertion.  Next identified the radioactive seed in the left breast with the neoprobe.  I anesthetized the skin around the superior part of the areola of the left breast with Marcaine and made an incision with a scalpel I then performed a partial mastectomy removing the tissue around the mass at the 12 position of the breast.  With the aid of neoprobe I dissected around the mass circumferentially.  I took it off the pectoralis fashion.  I then marked all margins with marker paint.  An x-ray was performed on the specimen confirming that the radioactive seed and previous placed marker were in the specimen.  The specimen was then sent to pathology for evaluation.  I next identified an area of increased uptake of radioactive isotope in the left axilla.  I made incision with a scalpel after anesthetizing skin with Marcaine.  I then dissected down into the deep axillary tissue.  There appeared to be 2 lymph nodes together which I excised with the cautery.  These were the sentinel lymph nodes confirmed with the neoprobe and sent to pathology for evaluation.  I could palpate no further lymph nodes and there was no increased uptake radioactive isotope confirmed with the neoprobe in the nodal basin.  At this point, I anesthetized both incisions further with Marcaine.  Hemostasis was achieved with the cautery.  I placed surgical clips around the biopsy cavity of the partial mastectomy site.  I then closed both incisions with interrupted 3-0 Vicryl sutures and  4-0 Monocryl sutures.  Dermabond and a breast binder were then applied.  The patient tolerated procedure well.  All the counts were correct at the end of the procedure.  The patient was then extubated in the operating room and taken in a stable condition to the recovery room.          Andrea [name]   Date: 04/01/2019  Time: 8:58 AM

## 2019-04-01 NOTE — Progress Notes (Signed)
Emotional support during breast injections done by nuc med tech Eddie B.

## 2019-04-01 NOTE — Anesthesia Postprocedure Evaluation (Signed)
Anesthesia Post Note  Patient: Andrea [name]  Procedure(s) Performed: LEFT BREAST PARTIAL MASTECTOMY WITH RADIOACTIVE SEED AND LEFT SENTINEL LYMPH NODE BIOPSY (Left Breast) ATTEMPTED INSERTION PORT-A-CATH WITH ULTRASOUND (Right )     Patient location during evaluation: PACU Anesthesia Type: General Level of consciousness: awake and alert Pain management: pain level controlled Vital Signs Assessment: post-procedure vital signs reviewed and stable Respiratory status: spontaneous breathing, nonlabored ventilation and respiratory function stable Cardiovascular status: blood pressure returned to baseline and stable Postop Assessment: no apparent nausea or vomiting Anesthetic complications: no    Last Vitals:  Vitals:   04/01/19 0945 04/01/19 1000  BP: (!) 123/50 (!) 128/106  Pulse: 66 66  Resp: 12 13  Temp:    SpO2: 99% 99%    Last Pain:  Vitals:   04/01/19 1000  TempSrc:   PainSc: [place] Carollee Nussbaumer

## 2019-04-03 NOTE — Assessment & Plan Note (Addendum)
03/03/2019:Left breast pain and right nipple discharge, mammogram revealed 1.4 cm left breast mass which measured 1.7 x 1 x 1.5 cm retroareolar with mild nipple retraction, no enlarged lymph nodes: Biopsy revealed grade 3 IDC with DCIS ER 95%, PR 70%, Ki-67 10%, HER-2 positive ratio 2.3 and copy #4.2, T1CN0 stage Ia clinical stage  04/01/2019: Left lumpectomy: Ninfa Linden): IDC with DCIS, 2.2cm, HER2 positive by FISH, ER+ (95%), PR+ (70%), Ki67 10% involved margins, 0/1 lymph nodes negative.   Pathology counseling: I discussed the final pathology report of the patient provided  a copy of this report. I discussed the margins as well as lymph node surgeries. We also discussed the final staging along with previously performed ER/PR and HER-2/neu testing.  Treatment plan: 1.  Adjuvant Herceptin 2.  Adjuvant radiation therapy followed by 3.  Adjuvant antiestrogen therapy.  We gave her preoperative anastrozole and she tolerated it very well.

## 2019-04-04 ENCOUNTER — Other Ambulatory Visit: Payer: Self-pay | Admitting: Surgery

## 2019-04-04 ENCOUNTER — Encounter (HOSPITAL_BASED_OUTPATIENT_CLINIC_OR_DEPARTMENT_OTHER): Payer: Self-pay | Admitting: Surgery

## 2019-04-08 NOTE — Progress Notes (Signed)
HEMATOLOGY-ONCOLOGY [facility] VISIT PROGRESS NOTE  I connected with Ms. Vinnie Level by [employer] and verified that I am speaking with the correct person using two identifiers.  I discussed the limitations, risks, security and privacy concerns of performing an evaluation and management service by [facility] and the availability of in person appointments.  I also discussed with the patient that there may be a patient responsible charge related to this service. The patient expressed understanding and agreed to proceed.   [facility] video call Patient location: Home Physician location: Clinic   Patient Care Team: Gayland Curry, DO as PCP - General (Geriatric Medicine) Tanda Rockers, MD as Consulting Physician (Pulmonary Disease) Deveron Furlong, NP as Nurse Practitioner (Psychiatry)  DIAGNOSIS:    ICD-10-CM   1. Malignant neoplasm of upper-outer quadrant of left breast in female, estrogen receptor positive ([place]) C50.412    Z17.0     SUMMARY OF ONCOLOGIC HISTORY:   Malignant neoplasm of upper-outer quadrant of left breast in female, estrogen receptor positive ([place])   03/03/2019 Initial Diagnosis    Left breast pain and right nipple discharge, mammogram revealed 1.4 cm left breast mass which measured 1.7 x 1 x 1.5 cm retroareolar with mild nipple retraction, no enlarged lymph nodes: Biopsy revealed grade 3 IDC with DCIS ER 95%, PR 70%, Ki-67 10%, HER-2 positive ratio 2.3 and copy #4.2, T1CN0 stage Ia clinical stage    03/12/2019 Cancer Staging    Staging form: Breast, AJCC 8th Edition - Clinical: Stage IA (cT1c, cN0, cM0, G3, ER+, PR+, HER2+) - Signed by Gardenia Phlegm, NP on 03/12/2019    04/01/2019 Surgery    Left lumpectomy Ninfa Linden): IDC with DCIS, 2.2cm, HER2 positive by FISH, ER+ (95%), PR+ (70%), Ki67 10% involved margins, 0/1 lymph nodes negative.      CHIEF COMPLIANT: Follow-up s/p lumpectomy to review pathology  INTERVAL HISTORY: Andrea [name]  is a 84 y.o. with above-mentioned history of left breast cancer who underwent a lumpectomy on 04/01/19 for which pathology confirmed grade 2 IDC, 2.2 cm, HER2 positive, ER 95%, PR 70%, Ki67 10%, with involved margins and no lymph node involvement. She will undergo a reexcision on 04/14/19. She presents to the clinic today to review the pathology report and discuss further treatment.   REVIEW OF SYSTEMS:   Constitutional: Denies fevers, chills or abnormal weight loss Eyes: Denies blurriness of vision Ears, nose, mouth, throat, and face: Denies mucositis or sore throat Respiratory: Denies cough, dyspnea or wheezes Cardiovascular: Denies palpitation, chest discomfort Gastrointestinal: Denies nausea, heartburn or change in bowel habits Skin: Denies abnormal skin rashes Lymphatics: Denies new lymphadenopathy or easy bruising Neurological: Denies numbness, tingling or new weaknesses Behavioral/Psych: Mood is stable, no new changes  Extremities: No lower extremity edema Breast: Recent lumpectomy All other systems were reviewed with the patient and are negative.  I have reviewed the past medical history, past surgical history, social history and family history with the patient and they are unchanged from previous note.  ALLERGIES:  is allergic to clindamycin hcl.  MEDICATIONS:  Current Outpatient Medications  Medication Sig Dispense Refill   anastrozole (ARIMIDEX) 1 MG tablet Take 1 tablet (1 mg total) by mouth daily. 90 tablet 3   Brexpiprazole 1 MG TABS Take 1 tablet by mouth at bedtime.     busPIRone (BUSPAR) 5 MG tablet Take 5 mg by mouth 3 (three) times daily.     CALCIUM CITRATE PO Take 600 mg by mouth 2 (two) times daily.  clonazePAM (KLONOPIN) 0.5 MG tablet Take 1 tablet by mouth as needed in middle of the nite to go back to sleep 20 tablet 0   CRANBERRY EXTRACT PO Take 2 tablets by mouth daily.     DULoxetine (CYMBALTA) 60 MG capsule Take 2 capsules (120 mg total) by mouth  daily. 180 capsule 3   ELIQUIS 5 MG TABS tablet TAKE 1 TABLET BY MOUTH TWICE A DAY 60 tablet 5   ferrous sulfate 325 (65 FE) MG tablet Take 325 mg by mouth daily with breakfast.     furosemide (LASIX) 40 MG tablet TAKE 1/2 TABLET BY MOUTH EVERY DAY 45 tablet 1   levothyroxine (SYNTHROID) 50 MCG tablet TAKE ONE TABLET BY MOUTH ONCE DAILY 30 MINUTES BEFORE BREAKFAST FOR THYROID 90 tablet 0   mirabegron ER (MYRBETRIQ) 50 MG TB24 tablet Take 1 tablet (50 mg total) by mouth daily. 30 tablet 3   mirtazapine (REMERON) 15 MG tablet Take 7.5 mg by mouth at bedtime.  2   Multiple Vitamins-Minerals (MULTIVITAMIN PO) Take 1 tablet by mouth.     oxyCODONE-acetaminophen (PERCOCET) 10-325 MG tablet Take 1 tab every 4 hrs as needed for moderate pain and 2 tabs every 4 hrs as needed for severe pain 180 tablet 0   prochlorperazine (COMPAZINE) 10 MG tablet Take 1 tablet (10 mg total) by mouth every 6 (six) hours as needed for nausea or vomiting. 30 tablet 0   traMADol (ULTRAM) 50 MG tablet Take 1 tablet (50 mg total) by mouth every 6 (six) hours as needed. 25 tablet 0   traZODone (DESYREL) 100 MG tablet Take 2 tablets by mouth at bedtime     No current facility-administered medications for this visit.     PHYSICAL EXAMINATION: ECOG PERFORMANCE STATUS: 1 - Symptomatic but completely ambulatory   LABORATORY DATA:  I have reviewed the data as listed CMP Latest Ref Rng & Units 03/27/2019 02/20/2019 10/16/2018  Glucose 70 - 99 mg/dL 101(H) 109(H) 96  BUN 8 - 23 mg/dL 20 22 31(H)  Creatinine 0.44 - 1.00 mg/dL 0.85 0.95(H) 0.94(H)  Sodium 135 - 145 mmol/L 140 140 141  Potassium 3.5 - 5.1 mmol/L 4.5 3.8 4.0  Chloride 98 - 111 mmol/L 105 102 102  CO2 22 - 32 mmol/L '27 31 30  ' Calcium 8.9 - 10.3 mg/dL 9.4 9.4 9.2  Total Protein 6.1 - 8.1 g/dL - - 6.3  Total Bilirubin 0.2 - 1.2 mg/dL - - 0.4  Alkaline Phos 33 - 130 U/L - - -  AST 10 - 35 U/L - - 17  ALT 6 - 29 U/L - - 8    Lab Results  Component Value  Date   WBC 5.6 02/20/2019   HGB 11.5 (L) 02/20/2019   HCT 34.9 (L) 02/20/2019   MCV 85.7 02/20/2019   PLT 270 02/20/2019   NEUTROABS 3,797 02/20/2019    ASSESSMENT & PLAN:  Malignant neoplasm of upper-outer quadrant of left breast in female, estrogen receptor positive ([facility]) 03/03/2019:Left breast pain and right nipple discharge, mammogram revealed 1.4 cm left breast mass which measured 1.7 x 1 x 1.5 cm retroareolar with mild nipple retraction, no enlarged lymph nodes: Biopsy revealed grade 3 IDC with DCIS ER 95%, PR 70%, Ki-67 10%, HER-2 positive ratio 2.3 and copy #4.2, T1CN0 stage Ia clinical stage  04/01/2019: Left lumpectomy: Ninfa Linden): IDC with DCIS, 2.2cm, HER2 positive by FISH, ER+ (95%), PR+ (70%), Ki67 10% involved margins, 0/1 lymph nodes negative.   Pathology counseling: I discussed  the final pathology report of the patient provided  a copy of this report. I discussed the margins as well as lymph node surgeries. We also discussed the final staging along with previously performed ER/PR and HER-2/neu testing.  Treatment plan: Patient needs resection of the positive margins She will need interventional radiology to place a port because it was not possible to put the port in during surgery. 1.  Adjuvant Herceptin 2.  Adjuvant radiation therapy followed by 3.  Adjuvant antiestrogen therapy.  We gave her preoperative anastrozole and she tolerated it very well. Return to clinic to start Herceptin in the middle of June.  No orders of the defined types were placed in this encounter.  The patient has a good understanding of the overall plan. she agrees with it. she will call with any problems that may develop before the next visit here.  Nicholas Lose, MD 04/09/2019  Julious Oka Dorshimer am acting as scribe for Dr. Nicholas Lose.  I have reviewed the above documentation for accuracy and completeness, and I agree with the above.

## 2019-04-09 ENCOUNTER — Other Ambulatory Visit: Payer: Self-pay | Admitting: *Deleted

## 2019-04-09 ENCOUNTER — Inpatient Hospital Stay (HOSPITAL_BASED_OUTPATIENT_CLINIC_OR_DEPARTMENT_OTHER): Payer: Medicare Other | Admitting: Hematology and Oncology

## 2019-04-09 ENCOUNTER — Other Ambulatory Visit: Payer: Self-pay

## 2019-04-09 ENCOUNTER — Encounter (HOSPITAL_BASED_OUTPATIENT_CLINIC_OR_DEPARTMENT_OTHER): Payer: Self-pay | Admitting: *Deleted

## 2019-04-09 DIAGNOSIS — Z170 Estrogen receptor positive status [ER+]: Secondary | ICD-10-CM

## 2019-04-09 DIAGNOSIS — C50412 Malignant neoplasm of upper-outer quadrant of left female breast: Secondary | ICD-10-CM | POA: Diagnosis not present

## 2019-04-09 MED ORDER — LIDOCAINE-PRILOCAINE 2.5-2.5 % EX CREA: TOPICAL_CREAM | CUTANEOUS | 3 refills | Status: DC

## 2019-04-10 ENCOUNTER — Encounter: Payer: Self-pay | Admitting: *Deleted

## 2019-04-10 ENCOUNTER — Other Ambulatory Visit (HOSPITAL_COMMUNITY): Payer: Self-pay | Admitting: Surgery

## 2019-04-10 DIAGNOSIS — C50912 Malignant neoplasm of unspecified site of left female breast: Secondary | ICD-10-CM

## 2019-04-11 ENCOUNTER — Other Ambulatory Visit (HOSPITAL_COMMUNITY)
Admission: RE | Admit: 2019-04-11 | Discharge: 2019-04-11 | Disposition: A | Discharge status: Home / Self Care | Payer: Medicare Other | Source: Ambulatory Visit | Attending: Surgery | Admitting: Surgery

## 2019-04-11 ENCOUNTER — Other Ambulatory Visit: Payer: Self-pay

## 2019-04-11 DIAGNOSIS — Z01812 Encounter for preprocedural laboratory examination: Secondary | ICD-10-CM | POA: Diagnosis not present

## 2019-04-11 DIAGNOSIS — G894 Chronic pain syndrome: Secondary | ICD-10-CM

## 2019-04-11 DIAGNOSIS — Z1159 Encounter for screening for other viral diseases: Secondary | ICD-10-CM | POA: Diagnosis not present

## 2019-04-11 MED ORDER — OXYCODONE-ACETAMINOPHEN 10-325 MG PO TABS: ORAL_TABLET | ORAL | 0 refills | Status: DC

## 2019-04-11 NOTE — Telephone Encounter (Signed)
[place] Database verified and compliance confirmed   Lat filled 03/17/2019. Patient states she knows it a little early yet she needs it for she just had surgery for cancer and got a call today that they did not get it all and she will have to have another procedure.   Dr.Blackman did RX 6 days of Tramadol on 04/01/2019

## 2019-04-11 NOTE — Progress Notes (Signed)
Gatorade 2 drink given with instructions to complete by [place], surgical soap given with instructions, pt verbalized understanding.

## 2019-04-12 LAB — NOVEL CORONAVIRUS, NAA (HOSP ORDER, SEND-OUT TO REF LAB; TAT 18-24 HRS): SARS-CoV-2, NAA: NOT DETECTED

## 2019-04-14 NOTE — H&P (Signed)
Andrea [name] is an 84 y.o. female.   Chief Complaint: left breast cancer with positive margins  HPI: she is s/p left breast partial mastectomy.  Because of positive margins, the decision has been made to proceed to the OR for left breast re-excision. She is without complaints  Past Medical History:  Diagnosis Date  . A-fib ([place])   . Anemia   . Anxiety   . Arthritis    hands, spine   . Bladder incontinence   . Blood transfusion   . Cancer ([place]) 02/2019   left breast cancer  . Cervicalgia   . Constipation   . Cough    Wert-onset 08/2009, as of 2014- resolved   . Deep venous thrombosis ([place])    post op, rec'd/needed  blood thinner   . Depression   . Depression with anxiety   . Dysrhythmia    a-fib  . Falls frequently    pt. reports that she was falling 3 times a day, has had PT at a facility for a while & now is getting home PT 2-3 times/ week   . Headache(784.0) 09/27/2011  . History of stress test    15-20 yrs.ago   . Hypotension   . Hypothyroidism   . Lumbar radiculopathy 09/26/2011  . Pneumonia    never been in hosp. for pneumonia   . Prediabetes   . Right hand fracture    Monday  . Shortness of breath   . Sleep apnea    does not use consistently    Past Surgical History:  Procedure Laterality Date  . ANTERIOR CERVICAL DECOMP/DISCECTOMY FUSION N/A 07/09/2013   Procedure: ANTERIOR CERVICAL DECOMPRESSION/DISCECTOMY FUSION 3 LEVELS;  Surgeon: Sinclair Ship, MD;  Location: [facility];  Service: Orthopedics;  Laterality: N/A;  Anterior cervical decompression fusion, cervical 3-4, cervical 4-5-, cervical 5-6 with instrumentation, allograft.  . APPENDECTOMY    . BACK SURGERY     2000  . BREAST EXCISIONAL BIOPSY     breast bxs on left x 2, breast bx of right x 1-all benign  . EYE SURGERY     cataracts bilateral /w IOL  . HERNIA REPAIR     umbilical hernia- 6295  . JOINT REPLACEMENT  2006,2007   bilateral  . LAPAROSCOPIC OVARIAN CYSTECTOMY     not done by  laparscopy-abdominal incision  . PORTACATH PLACEMENT Right 04/01/2019   Procedure: ATTEMPTED INSERTION PORT-A-CATH WITH ULTRASOUND;  Surgeon: [name], MD;  Location: [facility];  Service: General;  Laterality: Right;  . RADIOACTIVE SEED GUIDED PARTIAL MASTECTOMY WITH AXILLARY SENTINEL LYMPH NODE BIOPSY Left 04/01/2019   Procedure: LEFT BREAST PARTIAL MASTECTOMY WITH RADIOACTIVE SEED AND LEFT SENTINEL LYMPH NODE BIOPSY;  Surgeon: [name], MD;  Location: [facility];  Service: General;  Laterality: Left;  . REPLACEMENT TOTAL KNEE BILATERAL    . TONSILLECTOMY      Family History  Problem Relation Age of Onset  . Heart disease Father   . Malignant hyperthermia Father   . Arthritis Father   . Deep vein thrombosis Son   . Diabetes Son   . Obesity Son   . Depression Son   . Hypertension Son   . Parkinson's disease Mother   . Diabetes Daughter   . Hypertension Daughter   . Anxiety disorder Daughter   . Hyperlipidemia Son   . Hypertension Son   . Cancer Maternal Aunt    Social History:  reports that she has been smoking cigarettes. She has  a 10.00 pack-year smoking history. She has never used smokeless tobacco. She reports current alcohol use. She reports that she does not use drugs.  Allergies:  Allergies  Allergen Reactions  . Clindamycin Hcl Other (See Comments)    REACTION: swelling, pt. Reports that she had a 16 lb. weightgain in one day    No medications prior to admission.    No results found for this or any previous visit (from the past 48 hour(s)). No results found.  Review of Systems  All other systems reviewed and are negative.   Height 5\' 1"  (1.549 m), weight 91.2 kg. Physical Exam  Constitutional: She is oriented to person, place, and time. She appears well-developed. No distress.  Cardiovascular: Normal rate, regular rhythm and normal heart sounds.  Respiratory: Effort normal and breath sounds normal. No respiratory  distress.  GI: Soft. There is no abdominal tenderness.  Musculoskeletal: Normal range of motion.  Neurological: She is alert and oriented to person, place, and time.  Skin: Skin is warm and dry. No erythema.   left breast incision clean  Assessment/Plan Left breast cancer with positive margins  Will proceed back to the OR to re-excise the inferior margins.  The risks of surgery were again discussed including the chance of still having positive margins post op  [name], MD 04/14/2019, 12:21 PM

## 2019-04-15 ENCOUNTER — Encounter (HOSPITAL_BASED_OUTPATIENT_CLINIC_OR_DEPARTMENT_OTHER): Payer: Self-pay | Admitting: *Deleted

## 2019-04-15 ENCOUNTER — Other Ambulatory Visit: Payer: Self-pay

## 2019-04-15 ENCOUNTER — Ambulatory Visit (HOSPITAL_BASED_OUTPATIENT_CLINIC_OR_DEPARTMENT_OTHER): Payer: Medicare Other | Admitting: Certified Registered"

## 2019-04-15 ENCOUNTER — Telehealth: Payer: Self-pay | Admitting: Hematology and Oncology

## 2019-04-15 ENCOUNTER — Encounter (HOSPITAL_BASED_OUTPATIENT_CLINIC_OR_DEPARTMENT_OTHER)
Admission: RE | Disposition: A | Discharge status: Home / Self Care | Payer: Self-pay | Source: Home / Self Care | Attending: Surgery

## 2019-04-15 ENCOUNTER — Ambulatory Visit (HOSPITAL_BASED_OUTPATIENT_CLINIC_OR_DEPARTMENT_OTHER)
Admission: RE | Admit: 2019-04-15 | Discharge: 2019-04-15 | Disposition: A | Discharge status: Home / Self Care | Payer: Medicare Other | Attending: Surgery | Admitting: Surgery

## 2019-04-15 DIAGNOSIS — Z6838 Body mass index (BMI) 38.0-38.9, adult: Secondary | ICD-10-CM | POA: Diagnosis not present

## 2019-04-15 DIAGNOSIS — D649 Anemia, unspecified: Secondary | ICD-10-CM | POA: Diagnosis not present

## 2019-04-15 DIAGNOSIS — Z9012 Acquired absence of left breast and nipple: Secondary | ICD-10-CM | POA: Insufficient documentation

## 2019-04-15 DIAGNOSIS — M1990 Unspecified osteoarthritis, unspecified site: Secondary | ICD-10-CM | POA: Insufficient documentation

## 2019-04-15 DIAGNOSIS — I959 Hypotension, unspecified: Secondary | ICD-10-CM | POA: Diagnosis not present

## 2019-04-15 DIAGNOSIS — I10 Essential (primary) hypertension: Secondary | ICD-10-CM | POA: Diagnosis not present

## 2019-04-15 DIAGNOSIS — E785 Hyperlipidemia, unspecified: Secondary | ICD-10-CM | POA: Diagnosis not present

## 2019-04-15 DIAGNOSIS — Z7901 Long term (current) use of anticoagulants: Secondary | ICD-10-CM | POA: Diagnosis not present

## 2019-04-15 DIAGNOSIS — F419 Anxiety disorder, unspecified: Secondary | ICD-10-CM | POA: Diagnosis not present

## 2019-04-15 DIAGNOSIS — E669 Obesity, unspecified: Secondary | ICD-10-CM | POA: Insufficient documentation

## 2019-04-15 DIAGNOSIS — E039 Hypothyroidism, unspecified: Secondary | ICD-10-CM | POA: Diagnosis not present

## 2019-04-15 DIAGNOSIS — Z66 Do not resuscitate: Secondary | ICD-10-CM | POA: Diagnosis not present

## 2019-04-15 DIAGNOSIS — R7303 Prediabetes: Secondary | ICD-10-CM | POA: Insufficient documentation

## 2019-04-15 DIAGNOSIS — Z170 Estrogen receptor positive status [ER+]: Secondary | ICD-10-CM | POA: Diagnosis not present

## 2019-04-15 DIAGNOSIS — F17210 Nicotine dependence, cigarettes, uncomplicated: Secondary | ICD-10-CM | POA: Diagnosis not present

## 2019-04-15 DIAGNOSIS — Z86718 Personal history of other venous thrombosis and embolism: Secondary | ICD-10-CM | POA: Diagnosis not present

## 2019-04-15 DIAGNOSIS — G4730 Sleep apnea, unspecified: Secondary | ICD-10-CM | POA: Diagnosis not present

## 2019-04-15 DIAGNOSIS — C50912 Malignant neoplasm of unspecified site of left female breast: Secondary | ICD-10-CM | POA: Diagnosis not present

## 2019-04-15 DIAGNOSIS — F319 Bipolar disorder, unspecified: Secondary | ICD-10-CM | POA: Diagnosis not present

## 2019-04-15 DIAGNOSIS — I4891 Unspecified atrial fibrillation: Secondary | ICD-10-CM | POA: Insufficient documentation

## 2019-04-15 DIAGNOSIS — C50412 Malignant neoplasm of upper-outer quadrant of left female breast: Secondary | ICD-10-CM | POA: Diagnosis not present

## 2019-04-15 HISTORY — PX: RE-EXCISION OF BREAST CANCER,SUPERIOR MARGINS: SHX6047

## 2019-04-15 SURGERY — RE-EXCISION OF BREAST CANCER,SUPERIOR MARGINS
Anesthesia: General | Site: Breast | Laterality: Left

## 2019-04-15 MED ORDER — FENTANYL CITRATE (PF) 100 MCG/2ML IJ SOLN
INTRAMUSCULAR | Status: AC
  Filled 2019-04-15: qty 2

## 2019-04-15 MED ORDER — DEXAMETHASONE SODIUM PHOSPHATE 10 MG/ML IJ SOLN
INTRAMUSCULAR | Status: DC | PRN
  Administered 2019-04-15: 5 mg via INTRAVENOUS

## 2019-04-15 MED ORDER — PROPOFOL 10 MG/ML IV BOLUS
INTRAVENOUS | Status: AC
  Filled 2019-04-15: qty 20

## 2019-04-15 MED ORDER — LIDOCAINE 2% (20 MG/ML) 5 ML SYRINGE
INTRAMUSCULAR | Status: AC
  Filled 2019-04-15: qty 5

## 2019-04-15 MED ORDER — GABAPENTIN 300 MG PO CAPS
ORAL_CAPSULE | ORAL | Status: AC
  Filled 2019-04-15: qty 1

## 2019-04-15 MED ORDER — SCOPOLAMINE 1 MG/3DAYS TD PT72: 1.0000 | MEDICATED_PATCH | Freq: Once | TRANSDERMAL | Status: DC | PRN

## 2019-04-15 MED ORDER — GABAPENTIN 300 MG PO CAPS
300.0000 mg | ORAL_CAPSULE | ORAL | Status: AC
  Administered 2019-04-15: 13:00:00 300 mg via ORAL

## 2019-04-15 MED ORDER — ONDANSETRON HCL 4 MG/2ML IJ SOLN
INTRAMUSCULAR | Status: DC | PRN
  Administered 2019-04-15: 4 mg via INTRAVENOUS

## 2019-04-15 MED ORDER — EPHEDRINE SULFATE 50 MG/ML IJ SOLN
INTRAMUSCULAR | Status: DC | PRN
  Administered 2019-04-15: 10 mg via INTRAVENOUS
  Administered 2019-04-15: 15 mg via INTRAVENOUS

## 2019-04-15 MED ORDER — PROPOFOL 10 MG/ML IV BOLUS
INTRAVENOUS | Status: DC | PRN
  Administered 2019-04-15: 160 mg via INTRAVENOUS

## 2019-04-15 MED ORDER — CEFAZOLIN SODIUM-DEXTROSE 2-3 GM-%(50ML) IV SOLR
INTRAVENOUS | Status: DC | PRN
  Administered 2019-04-15: 2 g via INTRAVENOUS

## 2019-04-15 MED ORDER — OXYCODONE HCL 5 MG PO TABS: 5.0000 mg | ORAL_TABLET | Freq: Once | ORAL | Status: DC | PRN

## 2019-04-15 MED ORDER — ACETAMINOPHEN 500 MG PO TABS
ORAL_TABLET | ORAL | Status: AC
  Filled 2019-04-15: qty 2

## 2019-04-15 MED ORDER — DEXAMETHASONE SODIUM PHOSPHATE 10 MG/ML IJ SOLN
INTRAMUSCULAR | Status: AC
  Filled 2019-04-15: qty 1

## 2019-04-15 MED ORDER — ACETAMINOPHEN 500 MG PO TABS
1000.0000 mg | ORAL_TABLET | ORAL | Status: AC
  Administered 2019-04-15: 13:00:00 1000 mg via ORAL

## 2019-04-15 MED ORDER — HYDROMORPHONE HCL 1 MG/ML IJ SOLN: 0.2500 mg | INTRAMUSCULAR | Status: DC | PRN

## 2019-04-15 MED ORDER — PROMETHAZINE HCL 25 MG/ML IJ SOLN: 6.2500 mg | INTRAMUSCULAR | Status: DC | PRN

## 2019-04-15 MED ORDER — CHLORHEXIDINE GLUCONATE CLOTH 2 % EX PADS: 6.0000 | MEDICATED_PAD | Freq: Once | CUTANEOUS | Status: DC

## 2019-04-15 MED ORDER — CEFAZOLIN SODIUM-DEXTROSE 2-4 GM/100ML-% IV SOLN: 2.0000 g | INTRAVENOUS | Status: DC

## 2019-04-15 MED ORDER — BUPIVACAINE-EPINEPHRINE (PF) 0.5% -1:200000 IJ SOLN
INTRAMUSCULAR | Status: DC | PRN
  Administered 2019-04-15: 10 mL

## 2019-04-15 MED ORDER — FENTANYL CITRATE (PF) 100 MCG/2ML IJ SOLN: 50.0000 ug | INTRAMUSCULAR | Status: DC | PRN

## 2019-04-15 MED ORDER — OXYCODONE HCL 5 MG/5ML PO SOLN: 5.0000 mg | Freq: Once | ORAL | Status: DC | PRN

## 2019-04-15 MED ORDER — CEFAZOLIN SODIUM-DEXTROSE 2-4 GM/100ML-% IV SOLN
INTRAVENOUS | Status: AC
  Filled 2019-04-15: qty 100

## 2019-04-15 MED ORDER — MIDAZOLAM HCL 2 MG/2ML IJ SOLN: 1.0000 mg | INTRAMUSCULAR | Status: DC | PRN

## 2019-04-15 MED ORDER — LIDOCAINE HCL (CARDIAC) PF 100 MG/5ML IV SOSY
PREFILLED_SYRINGE | INTRAVENOUS | Status: DC | PRN
  Administered 2019-04-15: 80 mg via INTRAVENOUS

## 2019-04-15 MED ORDER — ONDANSETRON HCL 4 MG/2ML IJ SOLN
INTRAMUSCULAR | Status: AC
  Filled 2019-04-15: qty 2

## 2019-04-15 MED ORDER — LACTATED RINGERS IV SOLN
INTRAVENOUS | Status: DC
  Administered 2019-04-15: 13:00:00 via INTRAVENOUS

## 2019-04-15 SURGICAL SUPPLY — 46 items
BLADE HEX COATED 2.75 (ELECTRODE) ×3 IMPLANT
BLADE SURG 15 STRL LF DISP TIS (BLADE) ×1 IMPLANT
BLADE SURG 15 STRL SS (BLADE) ×2
CANISTER SUCT 1200ML W/VALVE (MISCELLANEOUS) ×3 IMPLANT
CHLORAPREP W/TINT 26 (MISCELLANEOUS) ×3 IMPLANT
CLIP VESOCCLUDE SM WIDE 6/CT (CLIP) IMPLANT
COVER BACK TABLE REUSABLE LG (DRAPES) ×3 IMPLANT
COVER MAYO STAND REUSABLE (DRAPES) ×3 IMPLANT
COVER WAND RF STERILE (DRAPES) IMPLANT
DECANTER SPIKE VIAL GLASS SM (MISCELLANEOUS) IMPLANT
DERMABOND ADVANCED (GAUZE/BANDAGES/DRESSINGS) ×2
DERMABOND ADVANCED .7 DNX12 (GAUZE/BANDAGES/DRESSINGS) ×1 IMPLANT
DRAPE LAPAROTOMY 100X72 PEDS (DRAPES) ×3 IMPLANT
DRAPE UTILITY XL STRL (DRAPES) ×3 IMPLANT
ELECT REM PT RETURN 9FT ADLT (ELECTROSURGICAL) ×3
ELECTRODE REM PT RTRN 9FT ADLT (ELECTROSURGICAL) ×1 IMPLANT
GAUZE SPONGE 4X4 12PLY STRL (GAUZE/BANDAGES/DRESSINGS) ×3 IMPLANT
GLOVE BIO SURGEON STRL SZ7 (GLOVE) ×3 IMPLANT
GLOVE BIOGEL PI IND STRL 7.5 (GLOVE) IMPLANT
GLOVE BIOGEL PI INDICATOR 7.5 (GLOVE) ×2
GLOVE SURG SIGNA 7.5 PF LTX (GLOVE) ×3 IMPLANT
GLOVE SURG SYN 7.5  E (GLOVE) ×2
GLOVE SURG SYN 7.5 E (GLOVE) ×1 IMPLANT
GLOVE SURG SYN 7.5 PF PI (GLOVE) IMPLANT
GOWN STRL REUS W/ TWL LRG LVL3 (GOWN DISPOSABLE) ×1 IMPLANT
GOWN STRL REUS W/ TWL XL LVL3 (GOWN DISPOSABLE) ×1 IMPLANT
GOWN STRL REUS W/TWL LRG LVL3 (GOWN DISPOSABLE) ×2
GOWN STRL REUS W/TWL XL LVL3 (GOWN DISPOSABLE) ×2
KIT MARKER MARGIN INK (KITS) ×2 IMPLANT
NDL HYPO 25X1 1.5 SAFETY (NEEDLE) ×1 IMPLANT
NEEDLE HYPO 25X1 1.5 SAFETY (NEEDLE) ×3 IMPLANT
NS IRRIG 1000ML POUR BTL (IV SOLUTION) IMPLANT
PACK BASIN DAY SURGERY FS (CUSTOM PROCEDURE TRAY) ×3 IMPLANT
PENCIL BUTTON HOLSTER BLD 10FT (ELECTRODE) ×3 IMPLANT
SLEEVE SCD COMPRESS KNEE MED (MISCELLANEOUS) ×3 IMPLANT
SPONGE LAP 4X18 RFD (DISPOSABLE) ×3 IMPLANT
SUT MNCRL AB 4-0 PS2 18 (SUTURE) ×3 IMPLANT
SUT SILK 2 0 SH (SUTURE) ×3 IMPLANT
SUT VIC AB 3-0 SH 27 (SUTURE) ×2
SUT VIC AB 3-0 SH 27X BRD (SUTURE) ×1 IMPLANT
SYR CONTROL 10ML LL (SYRINGE) ×3 IMPLANT
TOWEL GREEN STERILE FF (TOWEL DISPOSABLE) ×3 IMPLANT
TRAY FAXITRON CT DISP (TRAY / TRAY PROCEDURE) IMPLANT
TUBE CONNECTING 20'X1/4 (TUBING) ×1
TUBE CONNECTING 20X1/4 (TUBING) ×1 IMPLANT
YANKAUER SUCT BULB TIP NO VENT (SUCTIONS) ×3 IMPLANT

## 2019-04-15 NOTE — Anesthesia Procedure Notes (Signed)
Procedure Name: LMA Insertion Performed by: Verita Lamb, CRNA Pre-anesthesia Checklist: Patient identified, Suction available, Patient being monitored, Emergency Drugs available and Timeout performed Patient Re-evaluated:Patient Re-evaluated prior to induction Oxygen Delivery Method: Circle system utilized Preoxygenation: Pre-oxygenation with 100% oxygen Induction Type: IV induction LMA: LMA inserted LMA Size: 4.0 Tube type: Oral Number of attempts: 1 Placement Confirmation: positive ETCO2,  CO2 detector and breath sounds checked- equal and bilateral Tube secured with: Tape Dental Injury: Teeth and Oropharynx as per pre-operative assessment

## 2019-04-15 NOTE — Discharge Instructions (Signed)
[facility] °Office Phone Number [phone] ° °BREAST BIOPSY/ PARTIAL MASTECTOMY: POST OP INSTRUCTIONS ° °Always review your discharge instruction sheet given to you by the facility where your surgery was performed. ° °IF YOU HAVE DISABILITY OR FAMILY LEAVE FORMS, YOU MUST BRING THEM TO THE OFFICE FOR PROCESSING.  DO NOT GIVE THEM TO YOUR DOCTOR. ° °1. A prescription for pain medication may be given to you upon discharge.  Take your pain medication as prescribed, if needed.  If narcotic pain medicine is not needed, then you may take acetaminophen (Tylenol) or ibuprofen (Advil) as needed. °2. Take your usually prescribed medications unless otherwise directed °3. If you need a refill on your pain medication, please contact your pharmacy.  They will contact our office to request authorization.  Prescriptions will not be filled after 5pm or on week-ends. °4. You should eat very light the first 24 hours after surgery, such as soup, crackers, pudding, etc.  Resume your normal diet the day after surgery. °5. Most patients will experience some swelling and bruising in the breast.  Ice packs and a good support bra will help.  Swelling and bruising can take several days to resolve.  °6. It is common to experience some constipation if taking pain medication after surgery.  Increasing fluid intake and taking a stool softener will usually help or prevent this problem from occurring.  A mild laxative (Milk of Magnesia or Miralax) should be taken according to package directions if there are no bowel movements after 48 hours. °7. Unless discharge instructions indicate otherwise, you may remove your bandages 24-48 hours after surgery, and you may shower at that time.  You may have steri-strips (small skin tapes) in place directly over the incision.  These strips should be left on the skin for 7-10 days.  If your surgeon used skin glue on the incision, you may shower in 24 hours.  The glue will flake off over the  next 2-3 weeks.  Any sutures or staples will be removed at the office during your follow-up visit. °8. ACTIVITIES:  You may resume regular daily activities (gradually increasing) beginning the next day.  Wearing a good support bra or sports bra minimizes pain and swelling.  You may have sexual intercourse when it is comfortable. °a. You may drive when you no longer are taking prescription pain medication, you can comfortably wear a seatbelt, and you can safely maneuver your car and apply brakes. °b. RETURN TO WORK:  ______________________________________________________________________________________ °9. You should see your doctor in the office for a follow-up appointment approximately two weeks after your surgery.  Your doctor’s nurse will typically make your follow-up appointment when she calls you with your pathology report.  Expect your pathology report 2-3 business days after your surgery.  You may call to check if you do not hear from us after three days. °10. OTHER INSTRUCTIONS: _______________________________________________________________________________________________ _____________________________________________________________________________________________________________________________________ °_____________________________________________________________________________________________________________________________________ °_____________________________________________________________________________________________________________________________________ ° °WHEN TO CALL YOUR DOCTOR: °1. Fever over 101.0 °2. Nausea and/or vomiting. °3. Extreme swelling or bruising. °4. Continued bleeding from incision. °5. Increased pain, redness, or drainage from the incision. ° °The clinic staff is available to answer your questions during regular business hours.  Please don’t hesitate to call and ask to speak to one of the nurses for clinical concerns.  If you have a medical emergency, go to the nearest  emergency room or call 911.  A surgeon from [facility] is always on call at the hospital. ° °For further questions, please visit [email or web address]  ° ° ° ° °  Post Anesthesia Home Care Instructions ° °Activity: °Get plenty of rest for the remainder of the day. A responsible individual must stay with you for 24 hours following the procedure.  °For the next 24 hours, DO NOT: °-Drive a car °-Operate machinery °-Drink alcoholic beverages °-Take any medication unless instructed by your physician °-Make any legal decisions or sign important papers. ° °Meals: °Start with liquid foods such as gelatin or soup. Progress to regular foods as tolerated. Avoid greasy, spicy, heavy foods. If nausea and/or vomiting occur, drink only clear liquids until the nausea and/or vomiting subsides. Call your physician if vomiting continues. ° °Special Instructions/Symptoms: °Your throat may feel dry or sore from the anesthesia or the breathing tube placed in your throat during surgery. If this causes discomfort, gargle with warm salt water. The discomfort should disappear within 24 hours. ° °If you had a scopolamine patch placed behind your ear for the management of post- operative nausea and/or vomiting: ° °1. The medication in the patch is effective for 72 hours, after which it should be removed.  Wrap patch in a tissue and discard in the trash. Wash hands thoroughly with soap and water. °2. You may remove the patch earlier than 72 hours if you experience unpleasant side effects which may include dry mouth, dizziness or visual disturbances. °3. Avoid touching the patch. Wash your hands with soap and water after contact with the patch. °  ° °

## 2019-04-15 NOTE — Transfer of Care (Signed)
Immediate Anesthesia Transfer of Care Note  Patient: Andrea [name]  Procedure(s) Performed: RE-EXCISION OF LEFT BREAST CANCER POSITIVE MARGINS (Left Breast)  Patient Location: PACU  Anesthesia Type:General  Level of Consciousness: awake, alert  and oriented  Airway & Oxygen Therapy: Patient Spontanous Breathing and Patient connected to face mask oxygen  Post-op Assessment: Report given to RN and Post -op Vital signs reviewed and stable  Post vital signs: Reviewed and stable  Last Vitals:  Vitals Value Taken Time  BP 109/58 04/15/2019  1:49 PM  Temp    Pulse 74 04/15/2019  1:51 PM  Resp 13 04/15/2019  1:51 PM  SpO2 100 % 04/15/2019  1:51 PM  Vitals shown include unvalidated device data.  Last Pain:  Vitals:   04/15/19 1225  TempSrc: Oral  PainSc: 6          Complications: No apparent anesthesia complications

## 2019-04-15 NOTE — Telephone Encounter (Signed)
Scheduled appts per sch msg. Called and spoke with daughter of patient. Confirmed dates and times

## 2019-04-15 NOTE — Op Note (Signed)
RE-EXCISION OF LEFT BREAST CANCER POSITIVE MARGINS  Procedure Note  Andrea [name] 04/15/2019   Pre-op Diagnosis: LEFT BREAST CANCER POSITIVE MARGINS     Post-op Diagnosis: same  Procedure(s): RE-EXCISION OF LEFT BREAST CANCER POSITIVE MARGINS  Surgeon(s): [name], MD  Anesthesia: General  Staff:  Circulator: Izora Ribas, RN Scrub Person: Quentin Cornwall A, CST  Estimated Blood Loss: Minimal               Specimens: New inferior margin is sent to pathology.  Indications: This is an 84 year old female who had undergone a partial mastectomy of the left breast.  The final pathology reveals positive inferior margins so the decision was made to excise the inferior margin  Procedure: The patient was brought to the operating room and identifies correct patient.  She is placed upon on the operating table and anesthesia was induced.  Her left breast was prepped and draped in usual sterile fashion.  I anesthetized the skin of the previous incision with Marcaine.  I then made an incision through the old scar with a scalpel.  I then identified the seroma cavity.  I grasped the inferior margins with Allis clamps and then widely excised the inferior margin for several centimeters.  Once it was removed I marked this new margin with marker paint marking all edges.  This was sent to pathology for evaluation.  I achieved hemostasis with the cautery.  I gated the wound further with saline and anesthetized it further with Marcaine.  I then closed the subcutaneous tissue with interrupted 3-0 Vicryl sutures and closed the skin with a running 4-0 Monocryl.  Dermabond was then applied.  The patient tolerated the procedure well.  All the counts were correct at the end of the procedure.  The patient was then extubated in the operating room and taken in a stable condition to the recovery room.          [name]   Date: 04/15/2019  Time: 1:41 PM

## 2019-04-15 NOTE — Interval H&P Note (Signed)
History and Physical Interval Note:no change in H and P  04/15/2019 12:49 PM  Andrea [name]  has presented today for surgery, with the diagnosis of LEFT BREAST Magna.  The various methods of treatment have been discussed with the patient and family. After consideration of risks, benefits and other options for treatment, the patient has consented to  Procedure(s): RE-EXCISION OF LEFT BREAST CANCER POSITIVE MARGINS (Left) as a surgical intervention.  The patient's history has been reviewed, patient examined, no change in status, stable for surgery.  I have reviewed the patient's chart and labs.  Questions were answered to the patient's satisfaction.     [name]

## 2019-04-15 NOTE — Anesthesia Preprocedure Evaluation (Signed)
Anesthesia Evaluation  Patient identified by MRN, date of birth, ID band Patient awake    Reviewed: Allergy & Precautions, NPO status , Patient's Chart, lab work & pertinent test results  History of Anesthesia Complications Negative for: history of anesthetic complications  Airway Mallampati: II  TM Distance: >3 FB Neck ROM: Full    Dental  (+) Lower Dentures, Upper Dentures   Pulmonary sleep apnea , Current Smoker,    breath sounds clear to auscultation       Cardiovascular (-) hypertension (denies)+ DVT  + dysrhythmias Atrial Fibrillation  Rhythm:Regular Rate:Normal   '20 TTE - EF 60-65%. Mildly increased left ventricular wall thickness. AV sclerosis without any evidence of stenosis.    Neuro/Psych  Headaches, PSYCHIATRIC DISORDERS Anxiety Depression Bipolar Disorder    GI/Hepatic negative GI ROS, Neg liver ROS,   Endo/Other  diabetesHypothyroidism  Obesity   Renal/GU negative Renal ROS  Female GU complaint     Musculoskeletal  (+) Arthritis ,   Abdominal   Peds  Hematology negative hematology ROS (+)   Anesthesia Other Findings   Reproductive/Obstetrics  Breast cancer                              Anesthesia Physical  Anesthesia Plan  ASA: III  Anesthesia Plan: General   Post-op Pain Management:    Induction: Intravenous  PONV Risk Score and Plan: 3 and Treatment may vary due to age or medical condition, Ondansetron, Dexamethasone and Midazolam  Airway Management Planned: LMA  Additional Equipment: None  Intra-op Plan:   Post-operative Plan: Extubation in OR  Informed Consent: I have reviewed the patients History and Physical, chart, labs and discussed the procedure including the risks, benefits and alternatives for the proposed anesthesia with the patient or authorized representative who has indicated his/her understanding and acceptance.   Patient has DNR.    Dental advisory given  Plan Discussed with: CRNA and Anesthesiologist  Anesthesia Plan Comments:         Anesthesia Quick Evaluation

## 2019-04-15 NOTE — Anesthesia Postprocedure Evaluation (Signed)
Anesthesia Post Note  Patient: Harper Vandervoort  Procedure(s) Performed: RE-EXCISION OF LEFT BREAST CANCER POSITIVE MARGINS (Left Breast)     Patient location during evaluation: PACU Anesthesia Type: General Level of consciousness: awake and alert Pain management: pain level controlled Vital Signs Assessment: post-procedure vital signs reviewed and stable Respiratory status: spontaneous breathing, nonlabored ventilation and respiratory function stable Cardiovascular status: blood pressure returned to baseline and stable Postop Assessment: no apparent nausea or vomiting Anesthetic complications: no    Last Vitals:  Vitals:   04/15/19 1423 04/15/19 1430  BP: (!) 102/51 118/64  Pulse: 76 74  Resp: 16 16  Temp:  36.9 C  SpO2: 98% 95%    Last Pain:  Vitals:   04/15/19 1430  TempSrc:   PainSc: 0-No pain                 Lynda Rainwater

## 2019-04-16 ENCOUNTER — Encounter (HOSPITAL_BASED_OUTPATIENT_CLINIC_OR_DEPARTMENT_OTHER): Payer: Self-pay | Admitting: Surgery

## 2019-04-16 DIAGNOSIS — I10 Essential (primary) hypertension: Secondary | ICD-10-CM | POA: Diagnosis not present

## 2019-04-16 DIAGNOSIS — R296 Repeated falls: Secondary | ICD-10-CM | POA: Diagnosis not present

## 2019-04-16 DIAGNOSIS — R2689 Other abnormalities of gait and mobility: Secondary | ICD-10-CM | POA: Diagnosis not present

## 2019-04-16 DIAGNOSIS — F419 Anxiety disorder, unspecified: Secondary | ICD-10-CM | POA: Diagnosis not present

## 2019-04-16 DIAGNOSIS — Z87440 Personal history of urinary (tract) infections: Secondary | ICD-10-CM | POA: Diagnosis not present

## 2019-04-16 DIAGNOSIS — F329 Major depressive disorder, single episode, unspecified: Secondary | ICD-10-CM | POA: Diagnosis not present

## 2019-04-16 DIAGNOSIS — E1169 Type 2 diabetes mellitus with other specified complication: Secondary | ICD-10-CM | POA: Diagnosis not present

## 2019-04-16 DIAGNOSIS — Z8673 Personal history of transient ischemic attack (TIA), and cerebral infarction without residual deficits: Secondary | ICD-10-CM | POA: Diagnosis not present

## 2019-04-16 DIAGNOSIS — Z483 Aftercare following surgery for neoplasm: Secondary | ICD-10-CM | POA: Diagnosis not present

## 2019-04-16 DIAGNOSIS — Z86718 Personal history of other venous thrombosis and embolism: Secondary | ICD-10-CM | POA: Diagnosis not present

## 2019-04-16 DIAGNOSIS — Z170 Estrogen receptor positive status [ER+]: Secondary | ICD-10-CM | POA: Diagnosis not present

## 2019-04-16 DIAGNOSIS — I480 Paroxysmal atrial fibrillation: Secondary | ICD-10-CM | POA: Diagnosis not present

## 2019-04-16 DIAGNOSIS — C50412 Malignant neoplasm of upper-outer quadrant of left female breast: Secondary | ICD-10-CM | POA: Diagnosis not present

## 2019-04-17 DIAGNOSIS — F329 Major depressive disorder, single episode, unspecified: Secondary | ICD-10-CM | POA: Diagnosis not present

## 2019-04-17 DIAGNOSIS — C50412 Malignant neoplasm of upper-outer quadrant of left female breast: Secondary | ICD-10-CM | POA: Diagnosis not present

## 2019-04-17 DIAGNOSIS — I480 Paroxysmal atrial fibrillation: Secondary | ICD-10-CM | POA: Diagnosis not present

## 2019-04-17 DIAGNOSIS — E1169 Type 2 diabetes mellitus with other specified complication: Secondary | ICD-10-CM | POA: Diagnosis not present

## 2019-04-17 DIAGNOSIS — I10 Essential (primary) hypertension: Secondary | ICD-10-CM | POA: Diagnosis not present

## 2019-04-17 DIAGNOSIS — Z483 Aftercare following surgery for neoplasm: Secondary | ICD-10-CM | POA: Diagnosis not present

## 2019-04-18 DIAGNOSIS — I10 Essential (primary) hypertension: Secondary | ICD-10-CM | POA: Diagnosis not present

## 2019-04-18 DIAGNOSIS — Z483 Aftercare following surgery for neoplasm: Secondary | ICD-10-CM | POA: Diagnosis not present

## 2019-04-18 DIAGNOSIS — E1169 Type 2 diabetes mellitus with other specified complication: Secondary | ICD-10-CM | POA: Diagnosis not present

## 2019-04-18 DIAGNOSIS — C50412 Malignant neoplasm of upper-outer quadrant of left female breast: Secondary | ICD-10-CM | POA: Diagnosis not present

## 2019-04-18 DIAGNOSIS — I480 Paroxysmal atrial fibrillation: Secondary | ICD-10-CM | POA: Diagnosis not present

## 2019-04-18 DIAGNOSIS — F329 Major depressive disorder, single episode, unspecified: Secondary | ICD-10-CM | POA: Diagnosis not present

## 2019-04-22 DIAGNOSIS — I480 Paroxysmal atrial fibrillation: Secondary | ICD-10-CM | POA: Diagnosis not present

## 2019-04-22 DIAGNOSIS — F329 Major depressive disorder, single episode, unspecified: Secondary | ICD-10-CM | POA: Diagnosis not present

## 2019-04-22 DIAGNOSIS — N3281 Overactive bladder: Secondary | ICD-10-CM | POA: Diagnosis not present

## 2019-04-22 DIAGNOSIS — I10 Essential (primary) hypertension: Secondary | ICD-10-CM | POA: Diagnosis not present

## 2019-04-22 DIAGNOSIS — E1169 Type 2 diabetes mellitus with other specified complication: Secondary | ICD-10-CM | POA: Diagnosis not present

## 2019-04-22 DIAGNOSIS — C50412 Malignant neoplasm of upper-outer quadrant of left female breast: Secondary | ICD-10-CM | POA: Diagnosis not present

## 2019-04-22 DIAGNOSIS — Z483 Aftercare following surgery for neoplasm: Secondary | ICD-10-CM | POA: Diagnosis not present

## 2019-04-24 DIAGNOSIS — C50412 Malignant neoplasm of upper-outer quadrant of left female breast: Secondary | ICD-10-CM | POA: Diagnosis not present

## 2019-04-24 DIAGNOSIS — F329 Major depressive disorder, single episode, unspecified: Secondary | ICD-10-CM | POA: Diagnosis not present

## 2019-04-24 DIAGNOSIS — I480 Paroxysmal atrial fibrillation: Secondary | ICD-10-CM | POA: Diagnosis not present

## 2019-04-24 DIAGNOSIS — Z483 Aftercare following surgery for neoplasm: Secondary | ICD-10-CM | POA: Diagnosis not present

## 2019-04-24 DIAGNOSIS — I10 Essential (primary) hypertension: Secondary | ICD-10-CM | POA: Diagnosis not present

## 2019-04-24 DIAGNOSIS — E1169 Type 2 diabetes mellitus with other specified complication: Secondary | ICD-10-CM | POA: Diagnosis not present

## 2019-04-25 ENCOUNTER — Other Ambulatory Visit: Payer: Self-pay | Admitting: Radiology

## 2019-04-25 ENCOUNTER — Other Ambulatory Visit: Payer: Self-pay | Admitting: Student

## 2019-04-26 ENCOUNTER — Other Ambulatory Visit: Payer: Self-pay | Admitting: Internal Medicine

## 2019-04-26 ENCOUNTER — Other Ambulatory Visit: Payer: Self-pay | Admitting: Radiology

## 2019-04-26 DIAGNOSIS — I480 Paroxysmal atrial fibrillation: Secondary | ICD-10-CM

## 2019-04-27 ENCOUNTER — Other Ambulatory Visit: Payer: Self-pay | Admitting: Internal Medicine

## 2019-04-28 ENCOUNTER — Other Ambulatory Visit: Payer: Self-pay

## 2019-04-28 ENCOUNTER — Encounter (HOSPITAL_COMMUNITY): Payer: Self-pay | Admitting: Interventional Radiology

## 2019-04-28 ENCOUNTER — Ambulatory Visit (HOSPITAL_COMMUNITY)
Admission: RE | Admit: 2019-04-28 | Discharge: 2019-04-28 | Disposition: A | Discharge status: Home / Self Care | Payer: Medicare Other | Source: Ambulatory Visit | Attending: Surgery | Admitting: Surgery

## 2019-04-28 DIAGNOSIS — Z86718 Personal history of other venous thrombosis and embolism: Secondary | ICD-10-CM | POA: Insufficient documentation

## 2019-04-28 DIAGNOSIS — Z96653 Presence of artificial knee joint, bilateral: Secondary | ICD-10-CM | POA: Diagnosis not present

## 2019-04-28 DIAGNOSIS — Z452 Encounter for adjustment and management of vascular access device: Secondary | ICD-10-CM | POA: Diagnosis not present

## 2019-04-28 DIAGNOSIS — C50912 Malignant neoplasm of unspecified site of left female breast: Secondary | ICD-10-CM | POA: Insufficient documentation

## 2019-04-28 DIAGNOSIS — M1990 Unspecified osteoarthritis, unspecified site: Secondary | ICD-10-CM | POA: Diagnosis not present

## 2019-04-28 DIAGNOSIS — I959 Hypotension, unspecified: Secondary | ICD-10-CM | POA: Diagnosis not present

## 2019-04-28 DIAGNOSIS — Z79890 Hormone replacement therapy (postmenopausal): Secondary | ICD-10-CM | POA: Diagnosis not present

## 2019-04-28 DIAGNOSIS — E039 Hypothyroidism, unspecified: Secondary | ICD-10-CM | POA: Diagnosis not present

## 2019-04-28 DIAGNOSIS — G4730 Sleep apnea, unspecified: Secondary | ICD-10-CM | POA: Insufficient documentation

## 2019-04-28 DIAGNOSIS — F17210 Nicotine dependence, cigarettes, uncomplicated: Secondary | ICD-10-CM | POA: Diagnosis not present

## 2019-04-28 DIAGNOSIS — Z8249 Family history of ischemic heart disease and other diseases of the circulatory system: Secondary | ICD-10-CM | POA: Insufficient documentation

## 2019-04-28 DIAGNOSIS — Z8261 Family history of arthritis: Secondary | ICD-10-CM | POA: Diagnosis not present

## 2019-04-28 DIAGNOSIS — R7303 Prediabetes: Secondary | ICD-10-CM | POA: Diagnosis not present

## 2019-04-28 DIAGNOSIS — Z9012 Acquired absence of left breast and nipple: Secondary | ICD-10-CM | POA: Diagnosis not present

## 2019-04-28 DIAGNOSIS — Z7901 Long term (current) use of anticoagulants: Secondary | ICD-10-CM | POA: Insufficient documentation

## 2019-04-28 DIAGNOSIS — I4891 Unspecified atrial fibrillation: Secondary | ICD-10-CM | POA: Insufficient documentation

## 2019-04-28 DIAGNOSIS — Z881 Allergy status to other antibiotic agents status: Secondary | ICD-10-CM | POA: Diagnosis not present

## 2019-04-28 DIAGNOSIS — Z79899 Other long term (current) drug therapy: Secondary | ICD-10-CM | POA: Insufficient documentation

## 2019-04-28 HISTORY — PX: IR IMAGING GUIDED PORT INSERTION: IMG5740

## 2019-04-28 LAB — CBC
HCT: 38.9 % (ref 36.0–46.0)
Hemoglobin: 12.4 g/dL (ref 12.0–15.0)
MCH: 28.4 pg (ref 26.0–34.0)
MCHC: 31.9 g/dL (ref 30.0–36.0)
MCV: 89.2 fL (ref 80.0–100.0)
Platelets: 260 10*3/uL (ref 150–400)
RBC: 4.36 MIL/uL (ref 3.87–5.11)
RDW: 15.3 % (ref 11.5–15.5)
WBC: 7.6 10*3/uL (ref 4.0–10.5)
nRBC: 0 % (ref 0.0–0.2)

## 2019-04-28 LAB — PROTIME-INR
INR: 1 (ref 0.8–1.2)
Prothrombin Time: 13.1 seconds (ref 11.4–15.2)

## 2019-04-28 MED ORDER — HEPARIN SOD (PORK) LOCK FLUSH 100 UNIT/ML IV SOLN
INTRAVENOUS | Status: AC | PRN
  Administered 2019-04-28 (×2): 500 [IU] via INTRAVENOUS

## 2019-04-28 MED ORDER — SODIUM CHLORIDE 0.9 % IV SOLN: INTRAVENOUS | Status: DC

## 2019-04-28 MED ORDER — MIDAZOLAM HCL 2 MG/2ML IJ SOLN
INTRAMUSCULAR | Status: AC
  Filled 2019-04-28: qty 2

## 2019-04-28 MED ORDER — CEFAZOLIN SODIUM-DEXTROSE 2-4 GM/100ML-% IV SOLN
2.0000 g | Freq: Once | INTRAVENOUS | Status: AC
  Administered 2019-04-28: 2 g via INTRAVENOUS

## 2019-04-28 MED ORDER — CEFAZOLIN SODIUM-DEXTROSE 2-4 GM/100ML-% IV SOLN
INTRAVENOUS | Status: AC
  Filled 2019-04-28: qty 100

## 2019-04-28 MED ORDER — FENTANYL CITRATE (PF) 100 MCG/2ML IJ SOLN
INTRAMUSCULAR | Status: AC
  Filled 2019-04-28: qty 2

## 2019-04-28 MED ORDER — FENTANYL CITRATE (PF) 100 MCG/2ML IJ SOLN
INTRAMUSCULAR | Status: AC | PRN
  Administered 2019-04-28 (×2): 25 ug via INTRAVENOUS

## 2019-04-28 MED ORDER — MIDAZOLAM HCL 2 MG/2ML IJ SOLN
INTRAMUSCULAR | Status: AC | PRN
  Administered 2019-04-28: 1 mg via INTRAVENOUS

## 2019-04-28 MED ORDER — LIDOCAINE-EPINEPHRINE (PF) 1 %-1:200000 IJ SOLN
INTRAMUSCULAR | Status: AC
  Filled 2019-04-28: qty 30

## 2019-04-28 MED ORDER — SODIUM CHLORIDE 0.9 % IV SOLN
INTRAVENOUS | Status: AC | PRN
  Administered 2019-04-28: 10 mL/h via INTRAVENOUS

## 2019-04-28 MED ORDER — LIDOCAINE-EPINEPHRINE (PF) 1 %-1:200000 IJ SOLN
INTRAMUSCULAR | Status: AC | PRN
  Administered 2019-04-28: 20 mL

## 2019-04-28 MED ORDER — HEPARIN SOD (PORK) LOCK FLUSH 100 UNIT/ML IV SOLN
INTRAVENOUS | Status: AC
  Filled 2019-04-28: qty 5

## 2019-04-28 NOTE — Procedures (Signed)
Breast ca  S/p RT IJ POWER PORT   TIP SVCRA NO COMP STABLE EBL MIN READY FOR USE FULL REPORT IN PACS

## 2019-04-28 NOTE — Discharge Instructions (Addendum)
Implanted Port Insertion, Care After °This sheet gives you information about how to care for yourself after your procedure. Your health care provider may also give you more specific instructions. If you have problems or questions, contact your health care provider. °What can I expect after the procedure? °After the procedure, it is common to have: °· Discomfort at the port insertion site. °· Bruising on the skin over the port. This should improve over 3-4 days. °Follow these instructions at home: °Port care °· After your port is placed, you will get a manufacturer's information card. The card has information about your port. Keep this card with you at all times. °· Take care of the port as told by your health care provider. Ask your health care provider if you or a family member can get training for taking care of the port at home. A home health care nurse may also take care of the port. °· Make sure to remember what type of port you have. °Incision care ° °  ° °· Follow instructions from your health care provider about how to take care of your port insertion site. Make sure you: °? Wash your hands with soap and water before and after you change your bandage (dressing). If soap and water are not available, use hand sanitizer. °? Change your dressing as told by your health care provider. °? Leave stitches (sutures), skin glue, or adhesive strips in place. These skin closures may need to stay in place for 2 weeks or longer. If adhesive strip edges start to loosen and curl up, you may trim the loose edges. Do not remove adhesive strips completely unless your health care provider tells you to do that. °· Check your port insertion site every day for signs of infection. Check for: °? Redness, swelling, or pain. °? Fluid or blood. °? Warmth. °? Pus or a bad smell. °Activity °· Return to your normal activities as told by your health care provider. Ask your health care provider what activities are safe for you. °· Do not  lift anything that is heavier than 10 lb (4.5 kg), or the limit that you are told, until your health care provider says that it is safe. °General instructions °· Take over-the-counter and prescription medicines only as told by your health care provider. °· Do not take baths, swim, or use a hot tub until your health care provider approves. Ask your health care provider if you may take showers. You may only be allowed to take sponge baths. °· Do not drive for 24 hours if you were given a sedative during your procedure. °· Wear a medical alert bracelet in case of an emergency. This will tell any health care providers that you have a port. °· Keep all follow-up visits as told by your health care provider. This is important. °Contact a health care provider if: °· You cannot flush your port with saline as directed, or you cannot draw blood from the port. °· You have a fever or chills. °· You have redness, swelling, or pain around your port insertion site. °· You have fluid or blood coming from your port insertion site. °· Your port insertion site feels warm to the touch. °· You have pus or a bad smell coming from the port insertion site. °Get help right away if: °· You have chest pain or shortness of breath. °· You have bleeding from your port that you cannot control. °Summary °· Take care of the port as told by your health   care provider. Keep the manufacturer's information card with you at all times. °· Change your dressing as told by your health care provider. °· Contact a health care provider if you have a fever or chills or if you have redness, swelling, or pain around your port insertion site. °· Keep all follow-up visits as told by your health care provider. °This information is not intended to replace advice given to you by your health care provider. Make sure you discuss any questions you have with your health care provider. °Document Released: 08/27/2013 Document Revised: 06/04/2018 Document Reviewed:  06/04/2018 °[employer] Interactive Patient Education © 2019 [employer] ° °Moderate Conscious Sedation, Adult, Care After °These instructions provide you with information about caring for yourself after your procedure. Your health care provider may also give you more specific instructions. Your treatment has been planned according to current medical practices, but problems sometimes occur. Call your health care provider if you have any problems or questions after your procedure. °What can I expect after the procedure? °After your procedure, it is common: °· To feel sleepy for several hours. °· To feel clumsy and have poor balance for several hours. °· To have poor judgment for several hours. °· To vomit if you eat too soon. °Follow these instructions at home: °For at least 24 hours after the procedure: ° °· Do not: °? Participate in activities where you could fall or become injured. °? Drive. °? Use heavy machinery. °? Drink alcohol. °? Take sleeping pills or medicines that cause drowsiness. °? Make important decisions or sign legal documents. °? Take care of children on your own. °· Rest. °Eating and drinking °· Follow the diet recommended by your health care provider. °· If you vomit: °? Drink water, juice, or soup when you can drink without vomiting. °? Make sure you have little or no nausea before eating solid foods. °General instructions °· Have a responsible adult stay with you until you are awake and alert. °· Take over-the-counter and prescription medicines only as told by your health care provider. °· If you smoke, do not smoke without supervision. °· Keep all follow-up visits as told by your health care provider. This is important. °Contact a health care provider if: °· You keep feeling nauseous or you keep vomiting. °· You feel light-headed. °· You develop a rash. °· You have a fever. °Get help right away if: °· You have trouble breathing. °This information is not intended to replace advice given to you  by your health care provider. Make sure you discuss any questions you have with your health care provider. °Document Released: 08/27/2013 Document Revised: 04/10/2016 Document Reviewed: 02/26/2016 °[employer] Interactive Patient Education © 2019 [employer] ° °

## 2019-04-28 NOTE — H&P (Signed)
Chief Complaint: Patient was seen in consultation today for breast cancer  Referring Physician(s): Blackman,Douglas  Supervising Physician: Daryll Brod  Patient Status: [facility] - Out-pt  History of Present Illness: Andrea [name] is a 84 y.o. female with history of a fib, anemia, hypotension, left breast cancer current undergoing current chemoradiation treatment.  She is referred to IR for Port-A-Cath placement at the request of Dr. Rush Farmer.   Patient presents to [facility] today in her usual state of health.  She has been NPO.  She denies fever, chills, shortness of breath, cough, abdominal pain, nausea.  She appropriately held her Eliquis.   Past Medical History:  Diagnosis Date   A-fib ([place])    Anemia    Anxiety    Arthritis    hands, spine    Bladder incontinence    Blood transfusion    Cancer ([place]) 02/2019   left breast cancer   Cervicalgia    Constipation    Cough    [place]-onset 08/2009, as of 2014- resolved    Deep venous thrombosis ([place])    post op, rec'd/needed  blood thinner    Depression    Depression with anxiety    Dysrhythmia    a-fib   Falls frequently    pt. reports that she was falling 3 times a day, has had PT at a facility for a while & now is getting home PT 2-3 times/ week    Headache(784.0) 09/27/2011   History of stress test    15-20 yrs.ago    Hypotension    Hypothyroidism    Lumbar radiculopathy 09/26/2011   Pneumonia    never been in hosp. for pneumonia    Prediabetes    Right hand fracture    Monday   Shortness of breath    Sleep apnea    does not use consistently    Past Surgical History:  Procedure Laterality Date   ANTERIOR CERVICAL DECOMP/DISCECTOMY FUSION N/A 07/09/2013   Procedure: ANTERIOR CERVICAL DECOMPRESSION/DISCECTOMY FUSION 3 LEVELS;  Surgeon: Sinclair Ship, MD;  Location: [facility];  Service: Orthopedics;  Laterality: N/A;  Anterior cervical decompression fusion, cervical 3-4,  cervical 4-5-, cervical 5-6 with instrumentation, allograft.   APPENDECTOMY     BACK SURGERY     2000   BREAST EXCISIONAL BIOPSY     breast bxs on left x 2, breast bx of right x 1-all benign   EYE SURGERY     cataracts bilateral /w IOL   HERNIA REPAIR     umbilical hernia- 2010   JOINT REPLACEMENT  2006,2007   bilateral   LAPAROSCOPIC OVARIAN CYSTECTOMY     not done by laparscopy-abdominal incision   PORTACATH PLACEMENT Right 04/01/2019   Procedure: ATTEMPTED INSERTION PORT-A-CATH WITH ULTRASOUND;  Surgeon: [name], MD;  Location: [place];  Service: General;  Laterality: Right;   RADIOACTIVE SEED GUIDED PARTIAL MASTECTOMY WITH AXILLARY SENTINEL LYMPH NODE BIOPSY Left 04/01/2019   Procedure: LEFT BREAST PARTIAL MASTECTOMY WITH RADIOACTIVE SEED AND LEFT SENTINEL LYMPH NODE BIOPSY;  Surgeon: [name], MD;  Location: [place];  Service: General;  Laterality: Left;   RE-EXCISION OF BREAST CANCER,SUPERIOR MARGINS Left 04/15/2019   Procedure: RE-EXCISION OF LEFT BREAST CANCER POSITIVE MARGINS;  Surgeon: [name], MD;  Location: [place];  Service: General;  Laterality: Left;   REPLACEMENT TOTAL KNEE BILATERAL     TONSILLECTOMY      Allergies: Clindamycin hcl  Medications: Prior to Admission medications  Medication Sig Start Date End Date Taking? Authorizing Provider  anastrozole (ARIMIDEX) 1 MG tablet Take 1 tablet (1 mg total) by mouth daily. 03/12/19  Yes Nicholas Lose, MD  Brexpiprazole 1 MG TABS Take 1 mg by mouth at bedtime.    Yes [provider]  busPIRone (BUSPAR) 5 MG tablet Take 5-10 mg by mouth See admin instructions. 10 MG IN THE MORNING, 5 MG AT DINNER   Yes [provider]  CALCIUM CITRATE PO Take 600 mg by mouth 2 (two) times daily.   Yes [provider]  clonazePAM (KLONOPIN) 0.5 MG tablet Take 1 tablet by mouth as needed in middle of the nite to go back to  sleep 12/07/17  Yes Reed, Tiffany L, DO  DULoxetine (CYMBALTA) 60 MG capsule Take 2 capsules (120 mg total) by mouth daily. 06/17/15  Yes Reed, Tiffany L, DO  ELIQUIS 5 MG TABS tablet TAKE 1 TABLET BY MOUTH TWICE A DAY 01/07/19  Yes Lauree Chandler, NP  ferrous sulfate (FERROUSUL) 325 (65 FE) MG tablet Take 325 mg by mouth daily with breakfast.   Yes [provider]  furosemide (LASIX) 40 MG tablet TAKE 1/2 TABLET BY MOUTH EVERY DAY 04/28/19  Yes Reed, Tiffany L, DO  levothyroxine (SYNTHROID) 50 MCG tablet TAKE ONE TABLET BY MOUTH ONCE DAILY 30 MINUTES BEFORE BREAKFAST FOR THYROID Patient taking differently: Take 50 mcg by mouth daily before breakfast. Take one tablet by mouth once daily 30 minutes before breakfast for thyroid 03/20/19  Yes Reed, Tiffany L, DO  mirtazapine (REMERON) 15 MG tablet Take 7.5 mg by mouth at bedtime. 05/12/15  Yes [provider]  Multiple Vitamins-Minerals (MULTIVITAMIN PO) Take 1 tablet by mouth daily.    Yes [provider]  MYRBETRIQ 50 MG TB24 tablet TAKE 1 TABLET BY MOUTH EVERY DAY 04/28/19  Yes Reed, Tiffany L, DO  oxyCODONE-acetaminophen (PERCOCET) 10-325 MG tablet Take 1 tab every 4 hrs as needed for moderate pain and 2 tabs every 4 hrs as needed for severe pain 04/11/19  Yes Reed, Tiffany L, DO  traMADol (ULTRAM) 50 MG tablet Take 1 tablet (50 mg total) by mouth every 6 (six) hours as needed. Patient taking differently: Take 50 mg by mouth every 6 (six) hours as needed (PAIN).  04/01/19  Yes [name], MD  traZODone (DESYREL) 100 MG tablet Take 200 mg by mouth at bedtime. Take 2 tablets by mouth at bedtime   Yes [provider]  vitamin B-12 (CYANOCOBALAMIN) 500 MCG tablet Take 500 mcg by mouth daily.   Yes [provider]  lidocaine-prilocaine (EMLA) cream Apply to affected area once Patient not taking: Reported on 04/22/2019 04/09/19   Nicholas Lose, MD  prochlorperazine (COMPAZINE) 10 MG tablet Take 1 tablet (10 mg  total) by mouth every 6 (six) hours as needed for nausea or vomiting. Patient not taking: Reported on 04/22/2019 03/27/19   Nicholas Lose, MD     Family History  Problem Relation Age of Onset   Heart disease Father    Malignant hyperthermia Father    Arthritis Father    Deep vein thrombosis Son    Diabetes Son    Obesity Son    Depression Son    Hypertension Son    Parkinson's disease Mother    Diabetes Daughter    Hypertension Daughter    Anxiety disorder Daughter    Hyperlipidemia Son    Hypertension Son    Cancer Maternal Aunt     Social History  Socioeconomic History   Marital status: Single    Spouse name: Not on file   Number of children: 3   Years of education: College   Highest education level: Not on file  Occupational History   Occupation: Retired Designer, multimedia strain: Not very hard   Food insecurity:    Worry: Never true    Inability: Never true   Transportation needs:    Medical: No    Non-medical: No  Tobacco Use   Smoking status: Current Every Day Smoker    Packs/day: 0.50    Years: 20.00    Pack years: 10.00    Types: Cigarettes   Smokeless tobacco: Never Used   Tobacco comment: Started back a few months ago as of 02/20/2019. 5-6 cig's daily  Substance and Sexual Activity   Alcohol use: Yes    Comment: rare   Drug use: No   Sexual activity: Never  Lifestyle   Physical activity:    Days per week: 0 days    Minutes per session: 0 min   Stress: Only a little  Relationships   Social connections:    Talks on phone: More than three times a week    Gets together: More than three times a week    Attends religious service: More than 4 times per year    Active member of club or organization: Yes    Attends meetings of clubs or organizations: Never    Relationship status: Divorced  Other Topics Concern   Not on file  Social History Narrative   Patient is single and lives alone.   Patient is  retired.   Patient has a college education.   Patient has three adult children   Patient is right-handed.   Patient drinks 3-4 cups of caffeine (soda and coffee) daily.   Walks with walker     Review of Systems: A 12 point ROS discussed and pertinent positives are indicated in the HPI above.  All other systems are negative.  Review of Systems  Constitutional: Negative for fatigue and fever.  Respiratory: Negative for cough and shortness of breath.   Cardiovascular: Negative for chest pain.  Gastrointestinal: Negative for abdominal pain, nausea and vomiting.  Musculoskeletal: Negative for back pain.  Psychiatric/Behavioral: Negative for behavioral problems and confusion.    Vital Signs: BP (!) 149/71    Pulse 65    Temp (!) 97.5 F (36.4 C) (Oral)    Ht _0  (1.549 m)    Wt 200 lb (90.7 kg)    SpO2 95%    BMI 37.79 kg/m   Physical Exam Vitals signs and nursing note reviewed.  Constitutional:      Appearance: Normal appearance.  HENT:     Mouth/Throat:     Mouth: Mucous membranes are moist.     Pharynx: Oropharynx is clear.  Neck:     Musculoskeletal: Normal range of motion and neck supple.  Cardiovascular:     Rate and Rhythm: Normal rate and regular rhythm.  Pulmonary:     Effort: Pulmonary effort is normal. No respiratory distress.     Breath sounds: Normal breath sounds.  Abdominal:     General: Abdomen is flat.     Palpations: Abdomen is soft.  Skin:    General: Skin is warm and dry.  Neurological:     General: No focal deficit present.     Mental Status: She is alert and oriented to person, place, and time. Mental  status is at baseline.  Psychiatric:        Mood and Affect: Mood normal.        Behavior: Behavior normal.        Thought Content: Thought content normal.        Judgment: Judgment normal.     MD Evaluation Airway: WNL Heart: WNL Abdomen: WNL Chest/ Lungs: WNL ASA  Classification: 3 Mallampati/Airway Score: One   Imaging: Nm Sentinel  Node Inj-no Rpt (breast)  Result Date: 04/01/2019 Sulfur colloid was injected by the nuclear medicine technologist for melanoma sentinel node.   Mm Breast Surgical Specimen  Result Date: 04/01/2019 CLINICAL DATA:  Specimen radiograph status post left breast lumpectomy. EXAM: SPECIMEN RADIOGRAPH OF THE LEFT BREAST COMPARISON:  Previous exam(s). FINDINGS: Status post excision of the left breast. The radioactive seed and biopsy marker clip are present and completely intact. IMPRESSION: Specimen radiograph of the left breast. Electronically Signed   By: Ammie Ferrier M.D.   On: 04/01/2019 08:33   Dg Chest Port 1v Same Day  Result Date: 04/01/2019 CLINICAL DATA:  Status post port placement attempt EXAM: PORTABLE CHEST 1 VIEW COMPARISON:  07/08/2018 FINDINGS: Cardiac shadow is stable. Aortic calcifications are again seen. The lungs are well aerated bilaterally. No pneumothorax is noted. Postsurgical changes in the cervical spine are noted. No acute bony abnormality is noted. IMPRESSION: No evidence of pneumothorax is noted following attempted port placement. Electronically Signed   By: Inez Catalina M.D.   On: 04/01/2019 09:45   [place] Radioactive Seed Loc  Result Date: 03/31/2019 CLINICAL DATA:  Patient for preoperative localization prior to left lumpectomy. EXAM: MAMMOGRAPHIC GUIDED RADIOACTIVE SEED LOCALIZATION OF THE LEFT BREAST COMPARISON:  Previous exam(s). FINDINGS: Patient presents for radioactive seed localization prior to left breast lumpectomy. I met with the patient and we discussed the procedure of seed localization including benefits and alternatives. We discussed the high likelihood of a successful procedure. We discussed the risks of the procedure including infection, bleeding, tissue injury and further surgery. We discussed the low dose of radioactivity involved in the procedure. Informed, written consent was given. The usual time-out protocol was performed immediately prior to the  procedure. Using mammographic guidance, sterile technique, 1% lidocaine and an I-125 radioactive seed, mass within the 12:30 o'clock was localized using a lateral approach. The follow-up mammogram images confirm the seed in the expected location and were marked for Dr. Ninfa Linden. Follow-up survey of the patient confirms presence of the radioactive seed. Order number of I-125 seed:  [id number]. Total activity:  5.681 millicuries reference Date: 02/07/2019 The patient tolerated the procedure well and was released from the [facility]. She was given instructions regarding seed removal. IMPRESSION: Radioactive seed localization left breast. No apparent complications. Electronically Signed   By: Lovey Newcomer M.D.   On: 03/31/2019 12:12   Mm Clip Placement Left  Result Date: 03/31/2019 CLINICAL DATA:  Patient status post seed placement for localization prior to left lumpectomy. EXAM: DIAGNOSTIC LEFT MAMMOGRAM POST ULTRASOUND-GUIDED RADIOACTIVE SEED PLACEMENT COMPARISON:  Previous exam(s). FINDINGS: Mammographic images were obtained following ultrasound-guided radioactive seed placement. These demonstrate radioactive seed within the mass adjacent to the ribbon shaped biopsy marking clip. IMPRESSION: Appropriate location of the radioactive seed. Final Assessment: Post Procedure Mammograms for Seed Placement Electronically Signed   By: Lovey Newcomer M.D.   On: 03/31/2019 12:10    Labs:  CBC: Recent Labs    09/06/18 1110 10/16/18 0849 02/20/19 0843 04/28/19 0810  WBC 8.2 5.1 5.6 7.6  HGB 13.8 13.2 11.5* 12.4  HCT 41.3 39.5 34.9* 38.9  PLT 253 221 270 260    COAGS: Recent Labs    09/02/18 1134 09/06/18 1043 09/16/18 1535 04/28/19 0810  INR 2.1 2.7 1.9* 1.0    BMP: Recent Labs    06/03/18 0955 10/16/18 0849 02/20/19 0843 03/27/19 1130  NA 137 141 140 140  K 3.9 4.0 3.8 4.5  CL 99 102 102 105  CO2 _0 GLUCOSE 100* 96 109* 101*  BUN 30* 31* 22 20  CALCIUM 9.4 9.2 9.4 9.4    CREATININE 1.08* 0.94* 0.95* 0.85  GFRNONAA 48* 57*  --  >60  GFRAA 56* 66  --  >60    LIVER FUNCTION TESTS: Recent Labs    06/03/18 0955 10/16/18 0849  BILITOT 0.5 0.4  AST 15 17  ALT 7 8  PROT 6.5 6.3    TUMOR MARKERS: No results for input(s): AFPTM, CEA, CA199, CHROMGRNA in the last 8760 hours.  Assessment and Plan: Patient with past medical history of breast cancer in need of durable venous access for chemoradiation.  IR consulted for Port-A-Cath placement at the request of Dr. Rush Farmer. Case reviewed by Dr. Annamaria Boots who approves patient for procedure.  Patient presents today in their usual state of health.  She has been NPO and is not currently on blood thinners.   Risks and benefits of image guided port-a-catheter placement was discussed with the patient including, but not limited to bleeding, infection, pneumothorax, or fibrin sheath development and need for additional procedures.  All of the patient's questions were answered, patient is agreeable to proceed. Consent signed and in chart.  Thank you for this interesting consult.  I greatly enjoyed meeting Andrea [name] and look forward to participating in their care.  A copy of this report was sent to the requesting provider on this date.  Electronically Signed: Docia Barrier, PA 04/28/2019, 10:11 AM   I spent a total of  30 Minutes   in face to face in clinical consultation, greater than 50% of which was counseling/coordinating care for breast cancer.

## 2019-04-29 DIAGNOSIS — I480 Paroxysmal atrial fibrillation: Secondary | ICD-10-CM | POA: Diagnosis not present

## 2019-04-29 DIAGNOSIS — C50412 Malignant neoplasm of upper-outer quadrant of left female breast: Secondary | ICD-10-CM | POA: Diagnosis not present

## 2019-04-29 DIAGNOSIS — I10 Essential (primary) hypertension: Secondary | ICD-10-CM | POA: Diagnosis not present

## 2019-04-29 DIAGNOSIS — E1169 Type 2 diabetes mellitus with other specified complication: Secondary | ICD-10-CM | POA: Diagnosis not present

## 2019-04-29 DIAGNOSIS — Z483 Aftercare following surgery for neoplasm: Secondary | ICD-10-CM | POA: Diagnosis not present

## 2019-04-29 DIAGNOSIS — F329 Major depressive disorder, single episode, unspecified: Secondary | ICD-10-CM | POA: Diagnosis not present

## 2019-04-30 DIAGNOSIS — I480 Paroxysmal atrial fibrillation: Secondary | ICD-10-CM | POA: Diagnosis not present

## 2019-04-30 DIAGNOSIS — E1169 Type 2 diabetes mellitus with other specified complication: Secondary | ICD-10-CM | POA: Diagnosis not present

## 2019-04-30 DIAGNOSIS — F329 Major depressive disorder, single episode, unspecified: Secondary | ICD-10-CM | POA: Diagnosis not present

## 2019-04-30 DIAGNOSIS — I10 Essential (primary) hypertension: Secondary | ICD-10-CM | POA: Diagnosis not present

## 2019-04-30 DIAGNOSIS — C50412 Malignant neoplasm of upper-outer quadrant of left female breast: Secondary | ICD-10-CM | POA: Diagnosis not present

## 2019-04-30 DIAGNOSIS — Z483 Aftercare following surgery for neoplasm: Secondary | ICD-10-CM | POA: Diagnosis not present

## 2019-05-01 DIAGNOSIS — Z483 Aftercare following surgery for neoplasm: Secondary | ICD-10-CM | POA: Diagnosis not present

## 2019-05-01 DIAGNOSIS — F329 Major depressive disorder, single episode, unspecified: Secondary | ICD-10-CM | POA: Diagnosis not present

## 2019-05-01 DIAGNOSIS — I480 Paroxysmal atrial fibrillation: Secondary | ICD-10-CM | POA: Diagnosis not present

## 2019-05-01 DIAGNOSIS — I10 Essential (primary) hypertension: Secondary | ICD-10-CM | POA: Diagnosis not present

## 2019-05-01 DIAGNOSIS — C50412 Malignant neoplasm of upper-outer quadrant of left female breast: Secondary | ICD-10-CM | POA: Diagnosis not present

## 2019-05-01 DIAGNOSIS — E1169 Type 2 diabetes mellitus with other specified complication: Secondary | ICD-10-CM | POA: Diagnosis not present

## 2019-05-02 DIAGNOSIS — I10 Essential (primary) hypertension: Secondary | ICD-10-CM | POA: Diagnosis not present

## 2019-05-02 DIAGNOSIS — Z483 Aftercare following surgery for neoplasm: Secondary | ICD-10-CM | POA: Diagnosis not present

## 2019-05-02 DIAGNOSIS — E1169 Type 2 diabetes mellitus with other specified complication: Secondary | ICD-10-CM | POA: Diagnosis not present

## 2019-05-02 DIAGNOSIS — I480 Paroxysmal atrial fibrillation: Secondary | ICD-10-CM | POA: Diagnosis not present

## 2019-05-02 DIAGNOSIS — C50412 Malignant neoplasm of upper-outer quadrant of left female breast: Secondary | ICD-10-CM | POA: Diagnosis not present

## 2019-05-02 DIAGNOSIS — F329 Major depressive disorder, single episode, unspecified: Secondary | ICD-10-CM | POA: Diagnosis not present

## 2019-05-02 NOTE — Assessment & Plan Note (Signed)
03/03/2019:Left breast pain and right nipple discharge, mammogram revealed 1.4 cm left breast mass which measured 1.7 x 1 x 1.5 cm retroareolar with mild nipple retraction, no enlarged lymph nodes: Biopsy revealed grade 3 IDC with DCIS ER 95%, PR 70%, Ki-67 10%, HER-2 positive ratio 2.3 and copy #4.2, T1CN0 stage Ia clinical stage  04/01/2019: Left lumpectomy: Andrea [name]): IDC with DCIS, 2.2cm, HER2 positive by FISH, ER+ (95%), PR+ (70%), Ki67 10% involved margins, 0/1 lymph nodes negative. 04/15/2019: Resection of the inferior margin: Benign  Treatment plan: 1.  Adjuvant Herceptin 2.  Adjuvant radiation therapy followed by 3.  Adjuvant antiestrogen therapy.  We gave her preoperative anastrozole and she tolerated it very well. She can continue anastrozole during radiation as well.  Return to clinic every 3 weeks for Herceptin every 6 weeks of follow-up with me.

## 2019-05-05 DIAGNOSIS — E1169 Type 2 diabetes mellitus with other specified complication: Secondary | ICD-10-CM | POA: Diagnosis not present

## 2019-05-05 DIAGNOSIS — C50412 Malignant neoplasm of upper-outer quadrant of left female breast: Secondary | ICD-10-CM | POA: Diagnosis not present

## 2019-05-05 DIAGNOSIS — F329 Major depressive disorder, single episode, unspecified: Secondary | ICD-10-CM | POA: Diagnosis not present

## 2019-05-05 DIAGNOSIS — I480 Paroxysmal atrial fibrillation: Secondary | ICD-10-CM | POA: Diagnosis not present

## 2019-05-05 DIAGNOSIS — I10 Essential (primary) hypertension: Secondary | ICD-10-CM | POA: Diagnosis not present

## 2019-05-05 DIAGNOSIS — Z483 Aftercare following surgery for neoplasm: Secondary | ICD-10-CM | POA: Diagnosis not present

## 2019-05-06 DIAGNOSIS — F329 Major depressive disorder, single episode, unspecified: Secondary | ICD-10-CM | POA: Diagnosis not present

## 2019-05-06 DIAGNOSIS — I10 Essential (primary) hypertension: Secondary | ICD-10-CM | POA: Diagnosis not present

## 2019-05-06 DIAGNOSIS — I480 Paroxysmal atrial fibrillation: Secondary | ICD-10-CM | POA: Diagnosis not present

## 2019-05-06 DIAGNOSIS — E1169 Type 2 diabetes mellitus with other specified complication: Secondary | ICD-10-CM | POA: Diagnosis not present

## 2019-05-06 DIAGNOSIS — C50412 Malignant neoplasm of upper-outer quadrant of left female breast: Secondary | ICD-10-CM | POA: Diagnosis not present

## 2019-05-06 DIAGNOSIS — Z483 Aftercare following surgery for neoplasm: Secondary | ICD-10-CM | POA: Diagnosis not present

## 2019-05-06 NOTE — Progress Notes (Signed)
Location of Breast Cancer: Left Breast  Histology per Pathology Report:  03/03/19 Diagnosis Breast, left, needle core biopsy, 12:30 o'clock - INVASIVE DUCTAL CARCINOMA. - DUCTAL CARCINOMA IN SITU.  Receptor Status: ER(95%), PR (70%), Her2-neu (POS), Ki-(10%)  04/01/19 Diagnosis 1. Breast, lumpectomy, left - INVASIVE DUCTAL CARCINOMA GRADE II/III, SPANNING 2.2 CM. - INVASIVE CARCINOMA IS BROADLY PRESENT AT THE INFERIOR MARGIN. - DUCTAL CARCINOMA IN SITU, INTERMEDIATE GRADE. - DUCTAL CARCINOMA IS FOCALLY LESS THAN 0.1 CM TO THE INFERIOR MARGIN. - SEE ONCOLOGY TABLE BELOW. 2. Lymph node, sentinel, biopsy - THERE IS NO EVIDENCE OF CARCINOMA IN 1 O F 1 LYMPH NODE (0/1).  04/15/19 Diagnosis Breast, excision, left new inferior margin - BENIGN BREAST PARENCHYMA PREVIOUS PROCEDURE-RELATED CHANGES - NO EVIDENCE OF IN SITU OR INVASIVE CARCINOMA  Did patient present with symptoms or was this found on screening mammography?: She presented with upper left breast pain and white nipple discharge.   Past/Anticipated interventions by surgeon, if any: 04/01/19  Procedure(s): LEFT BREAST PARTIAL MASTECTOMY WITH RADIOACTIVE SEED AND DEEP LEFT AXILLARY SENTINEL LYMPH NODE BIOPSY ATTEMPTED INSERTION PORT-A-CATH WITH ULTRASOUND Surgeon(s): [name], MD  04/15/19 Procedure(s): RE-EXCISION OF LEFT BREAST CANCER POSITIVE MARGINS Surgeon(s): [name], MD   Past/Anticipated interventions by medical oncology, if any:  04/09/19 Dr. Lindi Adie  Treatment plan: Patient needs resection of the positive margins She will need interventional radiology to place a port because it was not possible to put the port in during surgery. 1.  Adjuvant Herceptin 2.  Adjuvant radiation therapy followed by 3.  Adjuvant antiestrogen therapy.  We gave her preoperative anastrozole and she tolerated it very well. Return to clinic to start Herceptin in the middle of June.  Lymphedema issues, if any:  She denies.   Pain issues, if any: She denies  SAFETY ISSUES:  Prior radiation? No  Pacemaker/ICD? No  Possible current pregnancy? No  Is the patient on methotrexate? No  Current Complaints / other details  BP (!) 119/50 (BP Location: Right Arm, Patient Position: Sitting)   Pulse 64   Temp 98.9 F (37.2 C) (Temporal)   Resp 18   SpO2 97%

## 2019-05-07 DIAGNOSIS — F329 Major depressive disorder, single episode, unspecified: Secondary | ICD-10-CM | POA: Diagnosis not present

## 2019-05-07 DIAGNOSIS — E1169 Type 2 diabetes mellitus with other specified complication: Secondary | ICD-10-CM | POA: Diagnosis not present

## 2019-05-07 DIAGNOSIS — C50412 Malignant neoplasm of upper-outer quadrant of left female breast: Secondary | ICD-10-CM | POA: Diagnosis not present

## 2019-05-07 DIAGNOSIS — I10 Essential (primary) hypertension: Secondary | ICD-10-CM | POA: Diagnosis not present

## 2019-05-07 DIAGNOSIS — Z483 Aftercare following surgery for neoplasm: Secondary | ICD-10-CM | POA: Diagnosis not present

## 2019-05-07 DIAGNOSIS — I480 Paroxysmal atrial fibrillation: Secondary | ICD-10-CM | POA: Diagnosis not present

## 2019-05-08 ENCOUNTER — Other Ambulatory Visit: Payer: Self-pay

## 2019-05-08 DIAGNOSIS — Z170 Estrogen receptor positive status [ER+]: Secondary | ICD-10-CM

## 2019-05-08 DIAGNOSIS — C50412 Malignant neoplasm of upper-outer quadrant of left female breast: Secondary | ICD-10-CM

## 2019-05-08 NOTE — Progress Notes (Signed)
Patient Care Team: Gayland Curry, DO as PCP - General (Geriatric Medicine) Tanda Rockers, MD as Consulting Physician (Pulmonary Disease) Deveron Furlong, NP as Nurse Practitioner (Psychiatry)  DIAGNOSIS:    ICD-10-CM   1. Malignant neoplasm of upper-outer quadrant of left breast in female, estrogen receptor positive ([place])  C50.412    Z17.0     SUMMARY OF ONCOLOGIC HISTORY: Oncology History  Malignant neoplasm of upper-outer quadrant of left breast in female, estrogen receptor positive ([place])  03/03/2019 Initial Diagnosis   Left breast pain and right nipple discharge, mammogram revealed 1.4 cm left breast mass which measured 1.7 x 1 x 1.5 cm retroareolar with mild nipple retraction, no enlarged lymph nodes: Biopsy revealed grade 3 IDC with DCIS ER 95%, PR 70%, Ki-67 10%, HER-2 positive ratio 2.3 and copy #4.2, T1CN0 stage Ia clinical stage   03/12/2019 Cancer Staging   Staging form: Breast, AJCC 8th Edition - Clinical: Stage IA (cT1c, cN0, cM0, G3, ER+, PR+, HER2+) - Signed by Gardenia Phlegm, NP on 03/12/2019   04/01/2019 Surgery   Left lumpectomy Ninfa Linden): IDC with DCIS, 2.2cm, HER2 positive by FISH, ER+ (95%), PR+ (70%), Ki67 10% involved margins, 0/1 lymph nodes negative.    05/09/2019 -  Chemotherapy   The patient had trastuzumab (HERCEPTIN) 735 mg in sodium chloride 0.9 % 250 mL chemo infusion, 8 mg/kg = 735 mg, Intravenous,  Once, 0 of 17 cycles  for chemotherapy treatment.      CHIEF COMPLIANT: Adjuvant Herceptin treatment   INTERVAL HISTORY: Andrea [name] is a 84 y.o. with above-mentioned history of left breast cancer who underwent a lumpectomy followed by a re-excision. Her port was placed on 04/28/19 by Dr. Ninfa Linden. She presents to the clinic today to begin adjuvant treatment with Herceptin.   REVIEW OF SYSTEMS:   Constitutional: Denies fevers, chills or abnormal weight loss Eyes: Denies blurriness of vision Ears, nose, mouth, throat, and face: Denies  mucositis or sore throat Respiratory: Denies cough, dyspnea or wheezes Cardiovascular: Denies palpitation, chest discomfort Gastrointestinal: Denies nausea, heartburn or change in bowel habits Skin: Denies abnormal skin rashes Lymphatics: Denies new lymphadenopathy or easy bruising Neurological: Denies numbness, tingling or new weaknesses Behavioral/Psych: Mood is stable, no new changes  Extremities: No lower extremity edema Breast: denies any pain or lumps or nodules in either breasts All other systems were reviewed with the patient and are negative.  I have reviewed the past medical history, past surgical history, social history and family history with the patient and they are unchanged from previous note.  ALLERGIES:  is allergic to clindamycin hcl.  MEDICATIONS:  Current Outpatient Medications  Medication Sig Dispense Refill  . anastrozole (ARIMIDEX) 1 MG tablet Take 1 tablet (1 mg total) by mouth daily. 90 tablet 3  . Brexpiprazole 1 MG TABS Take 1 mg by mouth at bedtime.     . busPIRone (BUSPAR) 5 MG tablet Take 5-10 mg by mouth See admin instructions. 10 MG IN THE MORNING, 5 MG AT DINNER    . CALCIUM CITRATE PO Take 600 mg by mouth 2 (two) times daily.    . clonazePAM (KLONOPIN) 0.5 MG tablet Take 1 tablet by mouth as needed in middle of the nite to go back to sleep 20 tablet 0  . DULoxetine (CYMBALTA) 60 MG capsule Take 2 capsules (120 mg total) by mouth daily. 180 capsule 3  . ELIQUIS 5 MG TABS tablet TAKE 1 TABLET BY MOUTH TWICE A DAY 60 tablet 5  .  ferrous sulfate (FERROUSUL) 325 (65 FE) MG tablet Take 325 mg by mouth daily with breakfast.    . furosemide (LASIX) 40 MG tablet TAKE 1/2 TABLET BY MOUTH EVERY DAY 45 tablet 1  . levothyroxine (SYNTHROID) 50 MCG tablet TAKE ONE TABLET BY MOUTH ONCE DAILY 30 MINUTES BEFORE BREAKFAST FOR THYROID (Patient taking differently: Take 50 mcg by mouth daily before breakfast. Take one tablet by mouth once daily 30 minutes before breakfast for  thyroid) 90 tablet 0  . lidocaine-prilocaine (EMLA) cream Apply to affected area once (Patient not taking: Reported on 04/22/2019) 30 g 3  . mirtazapine (REMERON) 15 MG tablet Take 7.5 mg by mouth at bedtime.  2  . Multiple Vitamins-Minerals (MULTIVITAMIN PO) Take 1 tablet by mouth daily.     [name] MYRBETRIQ 50 MG TB24 tablet TAKE 1 TABLET BY MOUTH EVERY DAY 30 tablet 3  . oxyCODONE-acetaminophen (PERCOCET) 10-325 MG tablet Take 1 tab every 4 hrs as needed for moderate pain and 2 tabs every 4 hrs as needed for severe pain 180 tablet 0  . prochlorperazine (COMPAZINE) 10 MG tablet Take 1 tablet (10 mg total) by mouth every 6 (six) hours as needed for nausea or vomiting. (Patient not taking: Reported on 04/22/2019) 30 tablet 0  . traMADol (ULTRAM) 50 MG tablet Take 1 tablet (50 mg total) by mouth every 6 (six) hours as needed. (Patient taking differently: Take 50 mg by mouth every 6 (six) hours as needed (PAIN). ) 25 tablet 0  . traZODone (DESYREL) 100 MG tablet Take 200 mg by mouth at bedtime. Take 2 tablets by mouth at bedtime    . vitamin B-12 (CYANOCOBALAMIN) 500 MCG tablet Take 500 mcg by mouth daily.     No current facility-administered medications for this visit.    Facility-Administered Medications Ordered in Other Visits  Medication Dose Route Frequency Provider Last Rate Last Dose  . sodium chloride flush (NS) 0.9 % injection 10 mL  10 mL Intravenous PRN Nicholas Lose, MD   10 mL at 05/09/19 0920    PHYSICAL EXAMINATION: ECOG PERFORMANCE STATUS: 1 - Symptomatic but completely ambulatory  There were no vitals filed for this visit. There were no vitals filed for this visit.  GENERAL: alert, no distress and comfortable SKIN: skin color, texture, turgor are normal, no rashes or significant lesions EYES: normal, Conjunctiva are pink and non-injected, sclera clear OROPHARYNX: no exudate, no erythema and lips, buccal mucosa, and tongue normal  NECK: supple, thyroid normal size, non-tender,  without nodularity LYMPH: no palpable lymphadenopathy in the cervical, axillary or inguinal LUNGS: clear to auscultation and percussion with normal breathing effort HEART: regular rate & rhythm and no murmurs and no lower extremity edema ABDOMEN: abdomen soft, non-tender and normal bowel sounds MUSCULOSKELETAL: no cyanosis of digits and no clubbing  NEURO: alert & oriented x 3 with fluent speech, no focal motor/sensory deficits EXTREMITIES: No lower extremity edema  LABORATORY DATA:  I have reviewed the data as listed CMP Latest Ref Rng & Units 03/27/2019 02/20/2019 10/16/2018  Glucose 70 - 99 mg/dL 101(H) 109(H) 96  BUN 8 - 23 mg/dL 20 22 31(H)  Creatinine 0.44 - 1.00 mg/dL 0.85 0.95(H) 0.94(H)  Sodium 135 - 145 mmol/L 140 140 141  Potassium 3.5 - 5.1 mmol/L 4.5 3.8 4.0  Chloride 98 - 111 mmol/L 105 102 102  CO2 22 - 32 mmol/L '27 31 30  ' Calcium 8.9 - 10.3 mg/dL 9.4 9.4 9.2  Total Protein 6.1 - 8.1 g/dL - - 6.3  Total Bilirubin 0.2 - 1.2 mg/dL - - 0.4  Alkaline Phos 33 - 130 U/L - - -  AST 10 - 35 U/L - - 17  ALT 6 - 29 U/L - - 8    Lab Results  Component Value Date   WBC 7.6 04/28/2019   HGB 12.4 04/28/2019   HCT 38.9 04/28/2019   MCV 89.2 04/28/2019   PLT 260 04/28/2019   NEUTROABS 3,797 02/20/2019    ASSESSMENT & PLAN:  Malignant neoplasm of upper-outer quadrant of left breast in female, estrogen receptor positive ([facility]) 03/03/2019:Left breast pain and right nipple discharge, mammogram revealed 1.4 cm left breast mass which measured 1.7 x 1 x 1.5 cm retroareolar with mild nipple retraction, no enlarged lymph nodes: Biopsy revealed grade 3 IDC with DCIS ER 95%, PR 70%, Ki-67 10%, HER-2 positive ratio 2.3 and copy #4.2, T1CN0 stage Ia clinical stage  04/01/2019: Left lumpectomy: Ninfa Linden): IDC with DCIS, 2.2cm, HER2 positive by FISH, ER+ (95%), PR+ (70%), Ki67 10% involved margins, 0/1 lymph nodes negative. 04/15/2019: Resection of the inferior margin: Benign  Treatment plan:  1.  Adjuvant Herceptin started 05/09/2019 2.  Adjuvant radiation therapy followed by 3.  Adjuvant antiestrogen therapy.  Echocardiogram May 2020: EF 60 to 65% We gave her preoperative anastrozole and she tolerated it very well. She can continue anastrozole during radiation as well.  Return to clinic every 3 weeks for Herceptin every 6 weeks of follow-up with me.   No orders of the defined types were placed in this encounter.  The patient has a good understanding of the overall plan. she agrees with it. she will call with any problems that may develop before the next visit here.  Nicholas Lose, MD 05/09/2019  Julious Oka Dorshimer am acting as scribe for Dr. Nicholas Lose.  I have reviewed the above documentation for accuracy and completeness, and I agree with the above.

## 2019-05-09 ENCOUNTER — Other Ambulatory Visit: Payer: Self-pay | Admitting: *Deleted

## 2019-05-09 ENCOUNTER — Other Ambulatory Visit: Payer: Self-pay

## 2019-05-09 ENCOUNTER — Inpatient Hospital Stay: Payer: Medicare Other

## 2019-05-09 ENCOUNTER — Inpatient Hospital Stay: Payer: Medicare Other | Attending: Hematology and Oncology | Admitting: Hematology and Oncology

## 2019-05-09 DIAGNOSIS — Z5112 Encounter for antineoplastic immunotherapy: Secondary | ICD-10-CM | POA: Diagnosis not present

## 2019-05-09 DIAGNOSIS — Z170 Estrogen receptor positive status [ER+]: Secondary | ICD-10-CM | POA: Diagnosis not present

## 2019-05-09 DIAGNOSIS — C50412 Malignant neoplasm of upper-outer quadrant of left female breast: Secondary | ICD-10-CM | POA: Insufficient documentation

## 2019-05-09 DIAGNOSIS — Z7901 Long term (current) use of anticoagulants: Secondary | ICD-10-CM | POA: Diagnosis not present

## 2019-05-09 DIAGNOSIS — Z9221 Personal history of antineoplastic chemotherapy: Secondary | ICD-10-CM | POA: Diagnosis not present

## 2019-05-09 DIAGNOSIS — G894 Chronic pain syndrome: Secondary | ICD-10-CM

## 2019-05-09 DIAGNOSIS — Z95828 Presence of other vascular implants and grafts: Secondary | ICD-10-CM

## 2019-05-09 DIAGNOSIS — Z79811 Long term (current) use of aromatase inhibitors: Secondary | ICD-10-CM | POA: Diagnosis not present

## 2019-05-09 DIAGNOSIS — Z79899 Other long term (current) drug therapy: Secondary | ICD-10-CM | POA: Insufficient documentation

## 2019-05-09 LAB — CMP (CANCER CENTER ONLY)
ALT: 10 U/L (ref 0–44)
AST: 16 U/L (ref 15–41)
Albumin: 3.3 g/dL — ABNORMAL LOW (ref 3.5–5.0)
Alkaline Phosphatase: 69 U/L (ref 38–126)
Anion gap: 9 (ref 5–15)
BUN: 16 mg/dL (ref 8–23)
CO2: 28 mmol/L (ref 22–32)
Calcium: 9 mg/dL (ref 8.9–10.3)
Chloride: 102 mmol/L (ref 98–111)
Creatinine: 0.99 mg/dL (ref 0.44–1.00)
GFR, Est AFR Am: >60 mL/min (ref >60)
GFR, Estimated: 53 mL/min — ABNORMAL LOW (ref >60)
Glucose, Bld: 116 mg/dL — ABNORMAL HIGH (ref 70–99)
Potassium: 4 mmol/L (ref 3.5–5.1)
Sodium: 139 mmol/L (ref 135–145)
Total Bilirubin: 0.3 mg/dL (ref 0.3–1.2)
Total Protein: 6.6 g/dL (ref 6.5–8.1)

## 2019-05-09 LAB — CBC WITH DIFFERENTIAL (CANCER CENTER ONLY)
Abs Immature Granulocytes: 0.02 10*3/uL (ref 0.00–0.07)
Basophils Absolute: 0 10*3/uL (ref 0.0–0.1)
Basophils Relative: 0 %
Eosinophils Absolute: 0.2 10*3/uL (ref 0.0–0.5)
Eosinophils Relative: 2 %
HCT: 39 % (ref 36.0–46.0)
Hemoglobin: 12.5 g/dL (ref 12.0–15.0)
Immature Granulocytes: 0 %
Lymphocytes Relative: 12 %
Lymphs Abs: 1.1 10*3/uL (ref 0.7–4.0)
MCH: 28 pg (ref 26.0–34.0)
MCHC: 32.1 g/dL (ref 30.0–36.0)
MCV: 87.2 fL (ref 80.0–100.0)
Monocytes Absolute: 0.5 10*3/uL (ref 0.1–1.0)
Monocytes Relative: 6 %
Neutro Abs: 6.9 10*3/uL (ref 1.7–7.7)
Neutrophils Relative %: 80 %
Platelet Count: 242 10*3/uL (ref 150–400)
RBC: 4.47 MIL/uL (ref 3.87–5.11)
RDW: 15.1 % (ref 11.5–15.5)
WBC Count: 8.7 10*3/uL (ref 4.0–10.5)
nRBC: 0 % (ref 0.0–0.2)

## 2019-05-09 MED ORDER — ACETAMINOPHEN 325 MG PO TABS
ORAL_TABLET | ORAL | Status: AC
  Filled 2019-05-09: qty 2

## 2019-05-09 MED ORDER — DIPHENHYDRAMINE HCL 25 MG PO CAPS
25.0000 mg | ORAL_CAPSULE | Freq: Once | ORAL | Status: AC
  Administered 2019-05-09: 25 mg via ORAL

## 2019-05-09 MED ORDER — ACETAMINOPHEN 325 MG PO TABS
650.0000 mg | ORAL_TABLET | Freq: Once | ORAL | Status: AC
  Administered 2019-05-09: 650 mg via ORAL

## 2019-05-09 MED ORDER — DIPHENHYDRAMINE HCL 25 MG PO CAPS
ORAL_CAPSULE | ORAL | Status: AC
  Filled 2019-05-09: qty 1

## 2019-05-09 MED ORDER — TRASTUZUMAB CHEMO 150 MG IV SOLR
750.0000 mg | Freq: Once | INTRAVENOUS | Status: AC
  Administered 2019-05-09: 750 mg via INTRAVENOUS
  Filled 2019-05-09: qty 35.72

## 2019-05-09 MED ORDER — SODIUM CHLORIDE 0.9 % IV SOLN
Freq: Once | INTRAVENOUS | Status: AC
  Administered 2019-05-09: 10:00:00 via INTRAVENOUS
  Filled 2019-05-09: qty 250

## 2019-05-09 MED ORDER — SODIUM CHLORIDE 0.9% FLUSH
10.0000 mL | INTRAVENOUS | Status: DC | PRN
  Administered 2019-05-09: 10 mL
  Filled 2019-05-09: qty 10

## 2019-05-09 MED ORDER — HEPARIN SOD (PORK) LOCK FLUSH 100 UNIT/ML IV SOLN
500.0000 [IU] | Freq: Once | INTRAVENOUS | Status: AC | PRN
  Administered 2019-05-09: 500 [IU]
  Filled 2019-05-09: qty 5

## 2019-05-09 MED ORDER — OXYCODONE-ACETAMINOPHEN 10-325 MG PO TABS: ORAL_TABLET | ORAL | 0 refills | Status: DC

## 2019-05-09 MED ORDER — SODIUM CHLORIDE 0.9% FLUSH
10.0000 mL | INTRAVENOUS | Status: DC | PRN
  Administered 2019-05-09: 09:00:00 10 mL via INTRAVENOUS
  Filled 2019-05-09: qty 10

## 2019-05-09 NOTE — Patient Instructions (Signed)
[facility] Discharge Instructions for Patients Receiving Chemotherapy  Today you received the following chemotherapy agents :  Herceptin.  To help prevent nausea and vomiting after your treatment, we encourage you to take your nausea medication as prescribed.   If you develop nausea and vomiting that is not controlled by your nausea medication, call the clinic.   BELOW ARE SYMPTOMS THAT SHOULD BE REPORTED IMMEDIATELY:  *FEVER GREATER THAN 100.5 F  *CHILLS WITH OR WITHOUT FEVER  NAUSEA AND VOMITING THAT IS NOT CONTROLLED WITH YOUR NAUSEA MEDICATION  *UNUSUAL SHORTNESS OF BREATH  *UNUSUAL BRUISING OR BLEEDING  TENDERNESS IN MOUTH AND THROAT WITH OR WITHOUT PRESENCE OF ULCERS  *URINARY PROBLEMS  *BOWEL PROBLEMS  UNUSUAL RASH Items with * indicate a potential emergency and should be followed up as soon as possible.  Feel free to call the clinic should you have any questions or concerns. The clinic phone number is [phone].  Please show the CHEMO ALERT CARD at check-in to the Emergency Department and triage nurse.   Trastuzumab injection for infusion What is this medicine? TRASTUZUMAB (tras TOO zoo mab) is a monoclonal antibody. It is used to treat breast cancer and stomach cancer. This medicine may be used for other purposes; ask your health care provider or pharmacist if you have questions. COMMON BRAND NAME(S): Herceptin, KANJINTI, OGIVRI What should I tell my health care provider before I take this medicine? They need to know if you have any of these conditions: -heart disease -heart failure -lung or breathing disease, like asthma -an unusual or allergic reaction to trastuzumab, benzyl alcohol, or other medications, foods, dyes, or preservatives -pregnant or trying to get pregnant -breast-feeding How should I use this medicine? This drug is given as an infusion into a vein. It is administered in a hospital or clinic by a specially trained health  care professional. Talk to your pediatrician regarding the use of this medicine in children. This medicine is not approved for use in children. Overdosage: If you think you have taken too much of this medicine contact a poison control center or emergency room at once. NOTE: This medicine is only for you. Do not share this medicine with others. What if I miss a dose? It is important not to miss a dose. Call your doctor or health care professional if you are unable to keep an appointment. What may interact with this medicine? This medicine may interact with the following medications: -certain types of chemotherapy, such as daunorubicin, doxorubicin, epirubicin, and idarubicin This list may not describe all possible interactions. Give your health care provider a list of all the medicines, herbs, non-prescription drugs, or dietary supplements you use. Also tell them if you smoke, drink alcohol, or use illegal drugs. Some items may interact with your medicine. What should I watch for while using this medicine? Visit your doctor for checks on your progress. Report any side effects. Continue your course of treatment even though you feel ill unless your doctor tells you to stop. Call your doctor or health care professional for advice if you get a fever, chills or sore throat, or other symptoms of a cold or flu. Do not treat yourself. Try to avoid being around people who are sick. You may experience fever, chills and shaking during your first infusion. These effects are usually mild and can be treated with other medicines. Report any side effects during the infusion to your health care professional. Fever and chills usually do not happen with later infusions.   Do not become pregnant while taking this medicine or for 7 months after stopping it. Women should inform their doctor if they wish to become pregnant or think they might be pregnant. Women of child-bearing potential will need to have a negative pregnancy  test before starting this medicine. There is a potential for serious side effects to an unborn child. Talk to your health care professional or pharmacist for more information. Do not breast-feed an infant while taking this medicine or for 7 months after stopping it. Women must use effective birth control with this medicine. What side effects may I notice from receiving this medicine? Side effects that you should report to your doctor or health care professional as soon as possible: -allergic reactions like skin rash, itching or hives, swelling of the face, lips, or tongue -chest pain or palpitations -cough -dizziness -feeling faint or lightheaded, falls -fever -general ill feeling or flu-like symptoms -signs of worsening heart failure like breathing problems; swelling in your legs and feet -unusually weak or tired Side effects that usually do not require medical attention (report to your doctor or health care professional if they continue or are bothersome): -bone pain -changes in taste -diarrhea -joint pain -nausea/vomiting -weight loss This list may not describe all possible side effects. Call your doctor for medical advice about side effects. You may report side effects to FDA at 1-800-FDA-1088. Where should I keep my medicine? This drug is given in a hospital or clinic and will not be stored at home. NOTE: This sheet is a summary. It may not cover all possible information. If you have questions about this medicine, talk to your doctor, pharmacist, or health care provider.  2019 Elsevier/Gold Standard (2016-10-31 14:37:52)  

## 2019-05-09 NOTE — Telephone Encounter (Signed)
Patient requested Rx [facility] Verified LR: 04/11/2019 Pended Rx and sent to Dr. Mariea Clonts for approval.

## 2019-05-12 ENCOUNTER — Telehealth: Payer: Self-pay | Admitting: *Deleted

## 2019-05-12 NOTE — Telephone Encounter (Signed)
Called pt to see how she was feeling after her first dose of Herceptin yesterday.  Pt states she is asymptomatic.  I reinforced education on drinking plenty of fluids and to monitor her temperature.  I also educated pt to call us if she does start to experience any symptoms such as severe nausea, mouth sores, diarrhea, constipation, or anything not normal for her.  Pt verbalized understanding and appreciative of call.

## 2019-05-15 ENCOUNTER — Other Ambulatory Visit: Payer: Self-pay

## 2019-05-15 NOTE — Telephone Encounter (Signed)
Spoke with patient's daughter and advised results, we only have 2.5mg  tablet.

## 2019-05-15 NOTE — Telephone Encounter (Signed)
Patients care giver called to ask if we would be able to give patient some samples of Eliquis 5 mg as the medication is becoming to expensive for her but all we have is 2.5 mg  In office would this be alright to give her

## 2019-05-20 ENCOUNTER — Telehealth: Payer: Self-pay | Admitting: Hematology and Oncology

## 2019-05-20 ENCOUNTER — Ambulatory Visit
Admission: RE | Admit: 2019-05-20 | Discharge: 2019-05-20 | Disposition: A | Discharge status: Home / Self Care | Payer: Medicare Other | Source: Ambulatory Visit | Attending: Radiation Oncology | Admitting: Radiation Oncology

## 2019-05-20 ENCOUNTER — Other Ambulatory Visit: Payer: Self-pay

## 2019-05-20 ENCOUNTER — Encounter: Payer: Self-pay | Admitting: Radiation Oncology

## 2019-05-20 DIAGNOSIS — C50412 Malignant neoplasm of upper-outer quadrant of left female breast: Secondary | ICD-10-CM | POA: Diagnosis not present

## 2019-05-20 DIAGNOSIS — Z79811 Long term (current) use of aromatase inhibitors: Secondary | ICD-10-CM | POA: Insufficient documentation

## 2019-05-20 DIAGNOSIS — Z170 Estrogen receptor positive status [ER+]: Secondary | ICD-10-CM | POA: Insufficient documentation

## 2019-05-20 DIAGNOSIS — Z79899 Other long term (current) drug therapy: Secondary | ICD-10-CM | POA: Diagnosis not present

## 2019-05-20 DIAGNOSIS — Z510 Encounter for antineoplastic radiation therapy: Secondary | ICD-10-CM | POA: Diagnosis not present

## 2019-05-20 DIAGNOSIS — M7989 Other specified soft tissue disorders: Secondary | ICD-10-CM | POA: Diagnosis not present

## 2019-05-20 DIAGNOSIS — Z7901 Long term (current) use of anticoagulants: Secondary | ICD-10-CM | POA: Insufficient documentation

## 2019-05-20 DIAGNOSIS — Z9221 Personal history of antineoplastic chemotherapy: Secondary | ICD-10-CM | POA: Diagnosis not present

## 2019-05-20 DIAGNOSIS — Z9012 Acquired absence of left breast and nipple: Secondary | ICD-10-CM | POA: Diagnosis not present

## 2019-05-20 NOTE — Patient Instructions (Signed)
Coronavirus (COVID-19) Are you at risk?  Are you at risk for the Coronavirus (COVID-19)?  To be considered HIGH RISK for Coronavirus (COVID-19), you have to meet the following criteria:  . Traveled to [place], [place], [place], [place] or [place]; or in the [place] to [place], [place], [place], or [place]; and have fever, cough, and shortness of breath within the last 2 weeks of travel OR . Been in close contact with a person diagnosed with COVID-19 within the last 2 weeks and have fever, cough, and shortness of breath . IF YOU DO NOT MEET THESE CRITERIA, YOU ARE CONSIDERED LOW RISK FOR COVID-19.  What to do if you are HIGH RISK for COVID-19?  . If you are having a medical emergency, call 911. . Seek medical care right away. Before you go to a doctor's office, urgent care or emergency department, call ahead and tell them about your recent travel, contact with someone diagnosed with COVID-19, and your symptoms. You should receive instructions from your physician's office regarding next steps of care.  . When you arrive at healthcare provider, tell the healthcare staff immediately you have returned from visiting [place], [place], [place], [place] or [place]; or traveled in the [place] to [place], [place], [place], or [place]; in the last two weeks or you have been in close contact with a person diagnosed with COVID-19 in the last 2 weeks.   . Tell the health care staff about your symptoms: fever, cough and shortness of breath. . After you have been seen by a medical provider, you will be either: o Tested for (COVID-19) and discharged home on quarantine except to seek medical care if symptoms worsen, and asked to  - Stay home and avoid contact with others until you get your results (4-5 days)  - Avoid travel on public transportation if possible (such as bus, train, or airplane) or o Sent to the Emergency Department by EMS for evaluation, COVID-19 testing, and possible  admission depending on your condition and test results.  What to do if you are LOW RISK for COVID-19?  Reduce your risk of any infection by using the same precautions used for avoiding the common cold or flu:  . Wash your hands often with soap and warm water for at least 20 seconds.  If soap and water are not readily available, use an alcohol-based hand sanitizer with at least 60% alcohol.  . If coughing or sneezing, cover your mouth and nose by coughing or sneezing into the elbow areas of your shirt or coat, into a tissue or into your sleeve (not your hands). . Avoid shaking hands with others and consider head nods or verbal greetings only. . Avoid touching your eyes, nose, or mouth with unwashed hands.  . Avoid close contact with people who are sick. . Avoid places or events with large numbers of people in one location, like concerts or sporting events. . Carefully consider travel plans you have or are making. . If you are planning any travel outside or inside the [place], visit the CDC's Travelers' Health webpage for the latest health notices. . If you have some symptoms but not all symptoms, continue to monitor at home and seek medical attention if your symptoms worsen. . If you are having a medical emergency, call 911.   ADDITIONAL HEALTHCARE OPTIONS FOR PATIENTS  [facility] / e-Visit: [email or web address]         [facility]: [phone]  [facility]: [phone]                   [facility]: [phone]   

## 2019-05-20 NOTE — Telephone Encounter (Signed)
Scheduled appt per 6/30 sch message- pt daughter aware of appts added. [name]

## 2019-05-20 NOTE — Progress Notes (Signed)
  Radiation Oncology         [phone]) [phone] ________________________________  Name: Andrea [name] MRN: [id number]  Date: 05/20/2019  DOB: 05-28-1935  SIMULATION AND TREATMENT PLANNING NOTE    Outpatient  DIAGNOSIS:     ICD-10-CM   1. Malignant neoplasm of upper-outer quadrant of left breast in female, estrogen receptor positive ([place])  C50.412    Z17.0     NARRATIVE:  The patient was brought to the [facility].  Identity was confirmed.  All relevant records and images related to the planned course of therapy were reviewed.  The patient freely provided informed written consent to proceed with treatment after reviewing the details related to the planned course of therapy. The consent form was witnessed and verified by the simulation staff.    Then, the patient was set-up in a stable reproducible supine position for radiation therapy with her ipsilateral arm over her head, and her upper body secured in a custom-made Vac-lok device.  CT images were obtained.  Surface markings were placed.  The CT images were loaded into the planning software.    TREATMENT PLANNING NOTE: Treatment planning then occurred.  The radiation prescription was entered and confirmed.     A total of 3 medically necessary complex treatment devices were fabricated and supervised by me: 2 fields with MLCs for custom blocks to protect heart, and lungs;  and, a Vac-lok. MORE COMPLEX DEVICES MAY BE MADE IN DOSIMETRY FOR FIELD IN FIELD BEAMS FOR DOSE HOMOGENEITY.  I have requested : 3D Simulation which is medically necessary to give adequate dose to at risk tissues while sparing lungs and heart.  I have requested a DVH of the following structures: lungs, heart, left lumpectomy cavity.    The patient will receive 40.05 Gy in 15 fractions to the left breast with 2 tangential fields.  This will be followed by a boost.  Optical Surface Tracking Plan:  Since intensity modulated radiotherapy (IMRT) and 3D conformal  radiation treatment methods are predicated on accurate and precise positioning for treatment, intrafraction motion monitoring is medically necessary to ensure accurate and safe treatment delivery. The ability to quantify intrafraction motion without excessive ionizing radiation dose can only be performed with optical surface tracking. Accordingly, surface imaging offers the opportunity to obtain 3D measurements of patient position throughout IMRT and 3D treatments without excessive radiation exposure. I am ordering optical surface tracking for this patient's upcoming course of radiotherapy.  ________________________________   Reference:  Ursula Alert, J, et al. Surface imaging-based analysis of intrafraction motion for breast radiotherapy patients.Journal of [employer], n. 6, nov. 2014. ISSN [id number].  Available at: <http://www.jacmp.org/index.php/jacmp/article/view/4957>.    -----------------------------------  Eppie Gibson, MD

## 2019-05-20 NOTE — Progress Notes (Signed)
Radiation Oncology         [phone] ________________________________  Name: Andrea [name] MRN: [id number]  Date: 05/20/2019  DOB: 21-Jul-1935  Follow-Up New Visit Note  Outpatient  CC: Dub Mikes, MD  Diagnosis:      ICD-10-CM   1. Malignant neoplasm of upper-outer quadrant of left breast in female, estrogen receptor positive ([place])  C50.412    Z17.0     Cancer Staging Malignant neoplasm of upper-outer quadrant of left breast in female, estrogen receptor positive ([place]) Staging form: Breast, AJCC 8th Edition - Clinical: Stage IA (cT1c, cN0, cM0, G3, ER+, PR+, HER2+) - Signed by Gardenia Phlegm, NP on 03/12/2019    - Pathologic Stage pT2, pN0 Left Breast UOQ Invasive Ductal Carcinoma, ER(+) / PR(+) / Her2(+), Grade 2  CHIEF COMPLAINT: Here to discuss management of left breast cancer  Narrative:  The patient returns today for follow-up.  Since initial consultation, she was started on preoperative anastrozole and tolerated it very well.  Left breast partial mastectomy with left sentinel lymph node biopsy on date of 04/01/19 revealed: Invasive ductal carcinoma, spanning 2.2 cm, with invasive carcinoma broadly present at the inferior margin. Ductal carcinoma in situ was also present, intermediate grade, and was focally less than 0.1 cm to the inferior margin. There was no evidence of carcinoma in 1 biopsied lymph node. ER status: 95%, PR status: 70%, Her2 status: positive by FISH; Grade 2.  The patient then underwent re-excision of positive inferior margin on 04/15/19. New margin showed no evidence of in situ or invasive carcinoma.  She began adjuvant treatment with Herceptin on 05/09/19. She returns today for further discussion of adjuvant radiation treatment. Per Dr. Lindi Adie, she will continue on anastrozole during her radiation treatment.  Symptomatically, the patient reports: no issues.  Her daughter was on speaker phone today while I met with the  patient in clinic.  Her daughter did mention that the patient had a reduced appetite and a temperature of 99 degrees after her first infusion of Herceptin.  She is getting better now.  The patient does have chronic swelling of her right lower extremity.        ALLERGIES:  is allergic to clindamycin hcl.  Meds: Current Outpatient Medications  Medication Sig Dispense Refill  . anastrozole (ARIMIDEX) 1 MG tablet Take 1 tablet (1 mg total) by mouth daily. 90 tablet 3  . Brexpiprazole 1 MG TABS Take 1 mg by mouth at bedtime.     . busPIRone (BUSPAR) 5 MG tablet Take 5-10 mg by mouth See admin instructions. 10 MG IN THE MORNING, 5 MG AT DINNER    . CALCIUM CITRATE PO Take 600 mg by mouth 2 (two) times daily.    . clonazePAM (KLONOPIN) 0.5 MG tablet Take 1 tablet by mouth as needed in middle of the nite to go back to sleep 20 tablet 0  . DULoxetine (CYMBALTA) 60 MG capsule Take 2 capsules (120 mg total) by mouth daily. 180 capsule 3  . ELIQUIS 5 MG TABS tablet TAKE 1 TABLET BY MOUTH TWICE A DAY 60 tablet 5  . ferrous sulfate (FERROUSUL) 325 (65 FE) MG tablet Take 325 mg by mouth daily with breakfast.    . furosemide (LASIX) 40 MG tablet TAKE 1/2 TABLET BY MOUTH EVERY DAY 45 tablet 1  . levothyroxine (SYNTHROID) 50 MCG tablet TAKE ONE TABLET BY MOUTH ONCE DAILY 30 MINUTES BEFORE BREAKFAST FOR THYROID (Patient taking differently: Take 50 mcg by  mouth daily before breakfast. Take one tablet by mouth once daily 30 minutes before breakfast for thyroid) 90 tablet 0  . mirtazapine (REMERON) 15 MG tablet Take 7.5 mg by mouth at bedtime.  2  . Multiple Vitamins-Minerals (MULTIVITAMIN PO) Take 1 tablet by mouth daily.     [name] MYRBETRIQ 50 MG TB24 tablet TAKE 1 TABLET BY MOUTH EVERY DAY 30 tablet 3  . oxyCODONE-acetaminophen (PERCOCET) 10-325 MG tablet Take 1 tab every 4 hrs as needed for moderate pain and 2 tabs every 4 hrs as needed for severe pain 180 tablet 0  . traMADol (ULTRAM) 50 MG tablet Take 1 tablet (50  mg total) by mouth every 6 (six) hours as needed. (Patient taking differently: Take 50 mg by mouth every 6 (six) hours as needed (PAIN). ) 25 tablet 0  . traZODone (DESYREL) 100 MG tablet Take 200 mg by mouth at bedtime. Take 2 tablets by mouth at bedtime    . vitamin B-12 (CYANOCOBALAMIN) 500 MCG tablet Take 500 mcg by mouth daily.    [name] lidocaine-prilocaine (EMLA) cream Apply to affected area once (Patient not taking: Reported on 04/22/2019) 30 g 3  . prochlorperazine (COMPAZINE) 10 MG tablet Take 1 tablet (10 mg total) by mouth every 6 (six) hours as needed for nausea or vomiting. (Patient not taking: Reported on 04/22/2019) 30 tablet 0   No current facility-administered medications for this encounter.     Physical Findings:  temporal temperature is 98.9 F (37.2 C). Her blood pressure is 119/50 (abnormal) and her pulse is 64. Her respiration is 18 and oxygen saturation is 97%. .     General: Alert and oriented, in no acute distress She is in a wheelchair  Right lower extremity shows swelling  Breast exam reveals excellent healing at left lumpectomy scar  Lab Findings: Lab Results  Component Value Date   WBC 8.7 05/09/2019   HGB 12.5 05/09/2019   HCT 39.0 05/09/2019   MCV 87.2 05/09/2019   PLT 242 05/09/2019    _0 @  Radiographic Findings: Ir Imaging Guided Port Insertion  Result Date: 04/28/2019 CLINICAL DATA:  Left breast carcinoma EXAM: RIGHT INTERNAL JUGULAR SINGLE LUMEN POWER PORT CATHETER INSERTION Date:  04/28/2019 04/28/2019 11:05 am Radiologist:  Jerilynn Mages. Daryll Brod, MD Guidance:  Ultrasound and fluoroscopic MEDICATIONS: Ancef 2 g; The antibiotic was administered within an appropriate time interval prior to skin puncture. ANESTHESIA/SEDATION: Versed 1.0 mg IV; Fentanyl 50 mcg IV; Moderate Sedation Time:  23 minutes The patient was continuously monitored during the procedure by the interventional radiology nurse under my direct supervision. FLUOROSCOPY TIME:  0 minutes, 36  seconds (1.0 mGy) COMPLICATIONS: None immediate. CONTRAST:  None PROCEDURE: Informed consent was obtained from the patient following explanation of the procedure, risks, benefits and alternatives. The patient understands, agrees and consents for the procedure. All questions were addressed. A time out was performed. Maximal barrier sterile technique utilized including caps, mask, sterile gowns, sterile gloves, large sterile drape, hand hygiene, and 2% chlorhexidine scrub. Under sterile conditions and local anesthesia, right internal jugular micropuncture venous access was performed. Access was performed with ultrasound. Images were obtained for documentation of the patent right internal jugular vein. A guide wire was inserted followed by a transitional dilator. This allowed insertion of a guide wire and catheter into the IVC. Measurements were obtained from the SVC / RA junction back to the right IJ venotomy site. In the right infraclavicular chest, a subcutaneous pocket was created over the second anterior rib. This was  done under sterile conditions and local anesthesia. 1% lidocaine with epinephrine was utilized for this. A 2.5 cm incision was made in the skin. Blunt dissection was performed to create a subcutaneous pocket over the right pectoralis major muscle. The pocket was flushed with saline vigorously. There was adequate hemostasis. The port catheter was assembled and checked for leakage. The port catheter was secured in the pocket with two retention sutures. The tubing was tunneled subcutaneously to the right venotomy site and inserted into the SVC/RA junction through a valved peel-away sheath. Position was confirmed with fluoroscopy. Images were obtained for documentation. The patient tolerated the procedure well. No immediate complications. Incisions were closed in a two layer fashion with 4 - 0 Vicryl suture. Dermabond was applied to the skin. The port catheter was accessed, blood was aspirated followed  by saline and heparin flushes. Needle was removed. A dry sterile dressing was applied. IMPRESSION: Ultrasound and fluoroscopically guided right internal jugular single lumen power port catheter insertion. Tip in the SVC/RA junction. Catheter ready for use. Electronically Signed   By: Jerilynn Mages.  Shick M.D.   On: 04/28/2019 11:16    Impression/Plan: Left Breast Cancer We discussed adjuvant radiotherapy today.  I recommend radiotherapy to the left breast in order to reduce risk of local regional recurrence by two thirds.  The risks, benefits and side effects of this treatment were discussed in detail.  She understands that radiotherapy is associated with skin irritation and fatigue in the acute setting. Late effects can include cosmetic changes and rare injury to internal organs.   She is enthusiastic about proceeding with treatment. A consent form has been signed and placed in her chart.  A total of 3 medically necessary complex treatment devices will be fabricated and supervised by me: 2 fields with MLCs for custom blocks to protect heart, and lungs;  and, a Vac-lok. MORE COMPLEX DEVICES MAY BE MADE IN DOSIMETRY FOR FIELD IN FIELD BEAMS FOR DOSE HOMOGENEITY.  I have requested : 3D Simulation which is medically necessary to give adequate dose to at risk tissues while sparing lungs and heart.  I have requested a DVH of the following structures: lungs, heart, left lumpectomy cavity.    The patient will receive 40.05 Gy in 15 fractions to the left breast with 2 fields.  This will be  followed by a boost.  I spent 15 minutes face to face with the patient and more than 50% of that time was spent in counseling and/or coordination of care. _____________________________________   Eppie Gibson, MD  This document serves as a record of services personally performed by Eppie Gibson, MD. It was created on her behalf by Rae Lips, a trained medical scribe. The creation of this record is based on the scribe's personal  observations and the provider's statements to them. This document has been checked and approved by the attending provider.

## 2019-05-27 DIAGNOSIS — Z510 Encounter for antineoplastic radiation therapy: Secondary | ICD-10-CM | POA: Diagnosis not present

## 2019-05-27 DIAGNOSIS — Z170 Estrogen receptor positive status [ER+]: Secondary | ICD-10-CM | POA: Diagnosis not present

## 2019-05-27 DIAGNOSIS — C50412 Malignant neoplasm of upper-outer quadrant of left female breast: Secondary | ICD-10-CM | POA: Diagnosis not present

## 2019-05-28 ENCOUNTER — Other Ambulatory Visit: Payer: Self-pay

## 2019-05-28 ENCOUNTER — Ambulatory Visit
Admission: RE | Admit: 2019-05-28 | Discharge: 2019-05-28 | Disposition: A | Discharge status: Home / Self Care | Payer: Medicare Other | Source: Ambulatory Visit | Attending: Radiation Oncology | Admitting: Radiation Oncology

## 2019-05-28 DIAGNOSIS — Z170 Estrogen receptor positive status [ER+]: Secondary | ICD-10-CM | POA: Diagnosis not present

## 2019-05-28 DIAGNOSIS — Z510 Encounter for antineoplastic radiation therapy: Secondary | ICD-10-CM | POA: Diagnosis not present

## 2019-05-28 DIAGNOSIS — C50412 Malignant neoplasm of upper-outer quadrant of left female breast: Secondary | ICD-10-CM | POA: Diagnosis not present

## 2019-05-28 DIAGNOSIS — N3941 Urge incontinence: Secondary | ICD-10-CM | POA: Diagnosis not present

## 2019-05-29 ENCOUNTER — Other Ambulatory Visit: Payer: Self-pay

## 2019-05-29 ENCOUNTER — Ambulatory Visit
Admission: RE | Admit: 2019-05-29 | Discharge: 2019-05-29 | Disposition: A | Discharge status: Home / Self Care | Payer: Medicare Other | Source: Ambulatory Visit | Attending: Radiation Oncology | Admitting: Radiation Oncology

## 2019-05-29 DIAGNOSIS — C50412 Malignant neoplasm of upper-outer quadrant of left female breast: Secondary | ICD-10-CM | POA: Diagnosis not present

## 2019-05-29 DIAGNOSIS — Z170 Estrogen receptor positive status [ER+]: Secondary | ICD-10-CM | POA: Diagnosis not present

## 2019-05-29 DIAGNOSIS — Z510 Encounter for antineoplastic radiation therapy: Secondary | ICD-10-CM | POA: Diagnosis not present

## 2019-05-30 ENCOUNTER — Inpatient Hospital Stay: Payer: Medicare Other | Attending: Hematology and Oncology

## 2019-05-30 ENCOUNTER — Ambulatory Visit
Admission: RE | Admit: 2019-05-30 | Discharge: 2019-05-30 | Disposition: A | Discharge status: Home / Self Care | Payer: Medicare Other | Source: Ambulatory Visit | Attending: Radiation Oncology | Admitting: Radiation Oncology

## 2019-05-30 ENCOUNTER — Other Ambulatory Visit: Payer: Self-pay

## 2019-05-30 VITALS — BP 122/72 | HR 67 | Temp 98.7°F | Resp 16 | Ht 61.0 in | Wt 201.2 lb

## 2019-05-30 DIAGNOSIS — C50412 Malignant neoplasm of upper-outer quadrant of left female breast: Secondary | ICD-10-CM | POA: Insufficient documentation

## 2019-05-30 DIAGNOSIS — Z5112 Encounter for antineoplastic immunotherapy: Secondary | ICD-10-CM | POA: Diagnosis not present

## 2019-05-30 DIAGNOSIS — Z79899 Other long term (current) drug therapy: Secondary | ICD-10-CM | POA: Diagnosis not present

## 2019-05-30 DIAGNOSIS — Z9221 Personal history of antineoplastic chemotherapy: Secondary | ICD-10-CM | POA: Insufficient documentation

## 2019-05-30 DIAGNOSIS — Z79811 Long term (current) use of aromatase inhibitors: Secondary | ICD-10-CM | POA: Insufficient documentation

## 2019-05-30 DIAGNOSIS — Z7901 Long term (current) use of anticoagulants: Secondary | ICD-10-CM | POA: Insufficient documentation

## 2019-05-30 DIAGNOSIS — Z923 Personal history of irradiation: Secondary | ICD-10-CM | POA: Insufficient documentation

## 2019-05-30 DIAGNOSIS — Z170 Estrogen receptor positive status [ER+]: Secondary | ICD-10-CM | POA: Insufficient documentation

## 2019-05-30 DIAGNOSIS — Z510 Encounter for antineoplastic radiation therapy: Secondary | ICD-10-CM | POA: Diagnosis not present

## 2019-05-30 MED ORDER — HEPARIN SOD (PORK) LOCK FLUSH 100 UNIT/ML IV SOLN
500.0000 [IU] | Freq: Once | INTRAVENOUS | Status: AC | PRN
  Administered 2019-05-30: 500 [IU]
  Filled 2019-05-30: qty 5

## 2019-05-30 MED ORDER — DIPHENHYDRAMINE HCL 25 MG PO CAPS
25.0000 mg | ORAL_CAPSULE | Freq: Once | ORAL | Status: AC
  Administered 2019-05-30: 10:00:00 25 mg via ORAL

## 2019-05-30 MED ORDER — TRASTUZUMAB CHEMO 150 MG IV SOLR
600.0000 mg | Freq: Once | INTRAVENOUS | Status: AC
  Administered 2019-05-30: 600 mg via INTRAVENOUS
  Filled 2019-05-30: qty 28.57

## 2019-05-30 MED ORDER — SODIUM CHLORIDE 0.9% FLUSH
10.0000 mL | INTRAVENOUS | Status: DC | PRN
  Administered 2019-05-30: 12:00:00 10 mL
  Filled 2019-05-30: qty 10

## 2019-05-30 MED ORDER — ACETAMINOPHEN 325 MG PO TABS
ORAL_TABLET | ORAL | Status: AC
  Filled 2019-05-30: qty 2

## 2019-05-30 MED ORDER — ACETAMINOPHEN 325 MG PO TABS
650.0000 mg | ORAL_TABLET | Freq: Once | ORAL | Status: AC
  Administered 2019-05-30: 650 mg via ORAL

## 2019-05-30 MED ORDER — DIPHENHYDRAMINE HCL 25 MG PO CAPS
ORAL_CAPSULE | ORAL | Status: AC
  Filled 2019-05-30: qty 1

## 2019-05-30 MED ORDER — SODIUM CHLORIDE 0.9 % IV SOLN
Freq: Once | INTRAVENOUS | Status: AC
  Administered 2019-05-30: 10:00:00 via INTRAVENOUS
  Filled 2019-05-30: qty 250

## 2019-05-30 NOTE — Patient Instructions (Signed)
[facility] Discharge Instructions for Patients Receiving Chemotherapy  Today you received the following chemotherapy agents :  Herceptin.  To help prevent nausea and vomiting after your treatment, we encourage you to take your nausea medication as prescribed.   If you develop nausea and vomiting that is not controlled by your nausea medication, call the clinic.   BELOW ARE SYMPTOMS THAT SHOULD BE REPORTED IMMEDIATELY:  *FEVER GREATER THAN 100.5 F  *CHILLS WITH OR WITHOUT FEVER  NAUSEA AND VOMITING THAT IS NOT CONTROLLED WITH YOUR NAUSEA MEDICATION  *UNUSUAL SHORTNESS OF BREATH  *UNUSUAL BRUISING OR BLEEDING  TENDERNESS IN MOUTH AND THROAT WITH OR WITHOUT PRESENCE OF ULCERS  *URINARY PROBLEMS  *BOWEL PROBLEMS  UNUSUAL RASH Items with * indicate a potential emergency and should be followed up as soon as possible.  Feel free to call the clinic should you have any questions or concerns. The clinic phone number is [phone].  Please show the CHEMO ALERT CARD at check-in to the Emergency Department and triage nurse.   Trastuzumab injection for infusion What is this medicine? TRASTUZUMAB (tras TOO zoo mab) is a monoclonal antibody. It is used to treat breast cancer and stomach cancer. This medicine may be used for other purposes; ask your health care provider or pharmacist if you have questions. COMMON BRAND NAME(S): Herceptin, KANJINTI, OGIVRI What should I tell my health care provider before I take this medicine? They need to know if you have any of these conditions: -heart disease -heart failure -lung or breathing disease, like asthma -an unusual or allergic reaction to trastuzumab, benzyl alcohol, or other medications, foods, dyes, or preservatives -pregnant or trying to get pregnant -breast-feeding How should I use this medicine? This drug is given as an infusion into a vein. It is administered in a hospital or clinic by a specially trained health  care professional. Talk to your pediatrician regarding the use of this medicine in children. This medicine is not approved for use in children. Overdosage: If you think you have taken too much of this medicine contact a poison control center or emergency room at once. NOTE: This medicine is only for you. Do not share this medicine with others. What if I miss a dose? It is important not to miss a dose. Call your doctor or health care professional if you are unable to keep an appointment. What may interact with this medicine? This medicine may interact with the following medications: -certain types of chemotherapy, such as daunorubicin, doxorubicin, epirubicin, and idarubicin This list may not describe all possible interactions. Give your health care provider a list of all the medicines, herbs, non-prescription drugs, or dietary supplements you use. Also tell them if you smoke, drink alcohol, or use illegal drugs. Some items may interact with your medicine. What should I watch for while using this medicine? Visit your doctor for checks on your progress. Report any side effects. Continue your course of treatment even though you feel ill unless your doctor tells you to stop. Call your doctor or health care professional for advice if you get a fever, chills or sore throat, or other symptoms of a cold or flu. Do not treat yourself. Try to avoid being around people who are sick. You may experience fever, chills and shaking during your first infusion. These effects are usually mild and can be treated with other medicines. Report any side effects during the infusion to your health care professional. Fever and chills usually do not happen with later infusions.   Do not become pregnant while taking this medicine or for 7 months after stopping it. Women should inform their doctor if they wish to become pregnant or think they might be pregnant. Women of child-bearing potential will need to have a negative pregnancy  test before starting this medicine. There is a potential for serious side effects to an unborn child. Talk to your health care professional or pharmacist for more information. Do not breast-feed an infant while taking this medicine or for 7 months after stopping it. Women must use effective birth control with this medicine. What side effects may I notice from receiving this medicine? Side effects that you should report to your doctor or health care professional as soon as possible: -allergic reactions like skin rash, itching or hives, swelling of the face, lips, or tongue -chest pain or palpitations -cough -dizziness -feeling faint or lightheaded, falls -fever -general ill feeling or flu-like symptoms -signs of worsening heart failure like breathing problems; swelling in your legs and feet -unusually weak or tired Side effects that usually do not require medical attention (report to your doctor or health care professional if they continue or are bothersome): -bone pain -changes in taste -diarrhea -joint pain -nausea/vomiting -weight loss This list may not describe all possible side effects. Call your doctor for medical advice about side effects. You may report side effects to FDA at 1-800-FDA-1088. Where should I keep my medicine? This drug is given in a hospital or clinic and will not be stored at home. NOTE: This sheet is a summary. It may not cover all possible information. If you have questions about this medicine, talk to your doctor, pharmacist, or health care provider.  2019 Elsevier/Gold Standard (2016-10-31 14:37:52)  

## 2019-06-02 ENCOUNTER — Ambulatory Visit
Admission: RE | Admit: 2019-06-02 | Discharge: 2019-06-02 | Disposition: A | Discharge status: Home / Self Care | Payer: Medicare Other | Source: Ambulatory Visit | Attending: Radiation Oncology | Admitting: Radiation Oncology

## 2019-06-02 ENCOUNTER — Other Ambulatory Visit: Payer: Self-pay

## 2019-06-02 DIAGNOSIS — C50412 Malignant neoplasm of upper-outer quadrant of left female breast: Secondary | ICD-10-CM | POA: Diagnosis not present

## 2019-06-02 DIAGNOSIS — Z510 Encounter for antineoplastic radiation therapy: Secondary | ICD-10-CM | POA: Diagnosis not present

## 2019-06-02 DIAGNOSIS — Z170 Estrogen receptor positive status [ER+]: Secondary | ICD-10-CM

## 2019-06-02 MED ORDER — RADIAPLEXRX EX GEL
Freq: Once | CUTANEOUS | Status: AC
  Administered 2019-06-02: 15:00:00 via TOPICAL

## 2019-06-02 MED ORDER — ALRA NON-METALLIC DEODORANT (RAD-ONC)
1.0000 "application " | Freq: Once | TOPICAL | Status: AC
  Administered 2019-06-02: 1 via TOPICAL

## 2019-06-03 ENCOUNTER — Other Ambulatory Visit: Payer: Self-pay

## 2019-06-03 ENCOUNTER — Ambulatory Visit
Admission: RE | Admit: 2019-06-03 | Discharge: 2019-06-03 | Disposition: A | Discharge status: Home / Self Care | Payer: Medicare Other | Source: Ambulatory Visit | Attending: Radiation Oncology | Admitting: Radiation Oncology

## 2019-06-03 DIAGNOSIS — C50412 Malignant neoplasm of upper-outer quadrant of left female breast: Secondary | ICD-10-CM | POA: Diagnosis not present

## 2019-06-03 DIAGNOSIS — Z170 Estrogen receptor positive status [ER+]: Secondary | ICD-10-CM | POA: Diagnosis not present

## 2019-06-03 DIAGNOSIS — Z510 Encounter for antineoplastic radiation therapy: Secondary | ICD-10-CM | POA: Diagnosis not present

## 2019-06-04 ENCOUNTER — Other Ambulatory Visit: Payer: Self-pay

## 2019-06-04 ENCOUNTER — Ambulatory Visit
Admission: RE | Admit: 2019-06-04 | Discharge: 2019-06-04 | Disposition: A | Discharge status: Home / Self Care | Payer: Medicare Other | Source: Ambulatory Visit | Attending: Radiation Oncology | Admitting: Radiation Oncology

## 2019-06-04 DIAGNOSIS — Z170 Estrogen receptor positive status [ER+]: Secondary | ICD-10-CM | POA: Diagnosis not present

## 2019-06-04 DIAGNOSIS — C50412 Malignant neoplasm of upper-outer quadrant of left female breast: Secondary | ICD-10-CM | POA: Diagnosis not present

## 2019-06-04 DIAGNOSIS — Z510 Encounter for antineoplastic radiation therapy: Secondary | ICD-10-CM | POA: Diagnosis not present

## 2019-06-05 ENCOUNTER — Other Ambulatory Visit: Payer: Self-pay

## 2019-06-05 ENCOUNTER — Ambulatory Visit
Admission: RE | Admit: 2019-06-05 | Discharge: 2019-06-05 | Disposition: A | Discharge status: Home / Self Care | Payer: Medicare Other | Source: Ambulatory Visit | Attending: Radiation Oncology | Admitting: Radiation Oncology

## 2019-06-05 DIAGNOSIS — Z510 Encounter for antineoplastic radiation therapy: Secondary | ICD-10-CM | POA: Diagnosis not present

## 2019-06-05 DIAGNOSIS — Z170 Estrogen receptor positive status [ER+]: Secondary | ICD-10-CM | POA: Diagnosis not present

## 2019-06-05 DIAGNOSIS — C50412 Malignant neoplasm of upper-outer quadrant of left female breast: Secondary | ICD-10-CM | POA: Diagnosis not present

## 2019-06-06 ENCOUNTER — Ambulatory Visit
Admission: RE | Admit: 2019-06-06 | Discharge: 2019-06-06 | Disposition: A | Discharge status: Home / Self Care | Payer: Medicare Other | Source: Ambulatory Visit | Attending: Radiation Oncology | Admitting: Radiation Oncology

## 2019-06-06 ENCOUNTER — Other Ambulatory Visit: Payer: Self-pay

## 2019-06-06 DIAGNOSIS — C50412 Malignant neoplasm of upper-outer quadrant of left female breast: Secondary | ICD-10-CM | POA: Diagnosis not present

## 2019-06-06 DIAGNOSIS — Z170 Estrogen receptor positive status [ER+]: Secondary | ICD-10-CM | POA: Diagnosis not present

## 2019-06-06 DIAGNOSIS — Z510 Encounter for antineoplastic radiation therapy: Secondary | ICD-10-CM | POA: Diagnosis not present

## 2019-06-09 ENCOUNTER — Ambulatory Visit
Admission: RE | Admit: 2019-06-09 | Discharge: 2019-06-09 | Disposition: A | Discharge status: Home / Self Care | Payer: Medicare Other | Source: Ambulatory Visit | Attending: Radiation Oncology | Admitting: Radiation Oncology

## 2019-06-09 ENCOUNTER — Other Ambulatory Visit: Payer: Self-pay

## 2019-06-09 DIAGNOSIS — C50412 Malignant neoplasm of upper-outer quadrant of left female breast: Secondary | ICD-10-CM | POA: Diagnosis not present

## 2019-06-09 DIAGNOSIS — Z510 Encounter for antineoplastic radiation therapy: Secondary | ICD-10-CM | POA: Diagnosis not present

## 2019-06-09 DIAGNOSIS — Z170 Estrogen receptor positive status [ER+]: Secondary | ICD-10-CM | POA: Diagnosis not present

## 2019-06-10 ENCOUNTER — Ambulatory Visit
Admission: RE | Admit: 2019-06-10 | Discharge: 2019-06-10 | Disposition: A | Discharge status: Home / Self Care | Payer: Medicare Other | Source: Ambulatory Visit | Attending: Radiation Oncology | Admitting: Radiation Oncology

## 2019-06-10 ENCOUNTER — Other Ambulatory Visit: Payer: Self-pay

## 2019-06-10 DIAGNOSIS — Z170 Estrogen receptor positive status [ER+]: Secondary | ICD-10-CM | POA: Diagnosis not present

## 2019-06-10 DIAGNOSIS — C50412 Malignant neoplasm of upper-outer quadrant of left female breast: Secondary | ICD-10-CM | POA: Diagnosis not present

## 2019-06-10 DIAGNOSIS — Z510 Encounter for antineoplastic radiation therapy: Secondary | ICD-10-CM | POA: Diagnosis not present

## 2019-06-11 ENCOUNTER — Ambulatory Visit
Admission: RE | Admit: 2019-06-11 | Discharge: 2019-06-11 | Disposition: A | Discharge status: Home / Self Care | Payer: Medicare Other | Source: Ambulatory Visit | Attending: Radiation Oncology | Admitting: Radiation Oncology

## 2019-06-11 ENCOUNTER — Other Ambulatory Visit: Payer: Self-pay

## 2019-06-11 ENCOUNTER — Other Ambulatory Visit: Payer: Self-pay | Admitting: *Deleted

## 2019-06-11 DIAGNOSIS — Z510 Encounter for antineoplastic radiation therapy: Secondary | ICD-10-CM | POA: Diagnosis not present

## 2019-06-11 DIAGNOSIS — C50412 Malignant neoplasm of upper-outer quadrant of left female breast: Secondary | ICD-10-CM | POA: Diagnosis not present

## 2019-06-11 DIAGNOSIS — Z170 Estrogen receptor positive status [ER+]: Secondary | ICD-10-CM | POA: Diagnosis not present

## 2019-06-11 DIAGNOSIS — G894 Chronic pain syndrome: Secondary | ICD-10-CM

## 2019-06-11 MED ORDER — OXYCODONE-ACETAMINOPHEN 10-325 MG PO TABS: ORAL_TABLET | ORAL | 0 refills | Status: DC

## 2019-06-11 NOTE — Telephone Encounter (Signed)
Patient requested refill [place] Database Verified LR: 05/09/2019 Pended Rx and sent to Dr. Mariea Clonts for approval.

## 2019-06-12 ENCOUNTER — Other Ambulatory Visit: Payer: Self-pay

## 2019-06-12 ENCOUNTER — Ambulatory Visit
Admission: RE | Admit: 2019-06-12 | Discharge: 2019-06-12 | Disposition: A | Discharge status: Home / Self Care | Payer: Medicare Other | Source: Ambulatory Visit | Attending: Radiation Oncology | Admitting: Radiation Oncology

## 2019-06-12 DIAGNOSIS — Z510 Encounter for antineoplastic radiation therapy: Secondary | ICD-10-CM | POA: Diagnosis not present

## 2019-06-12 DIAGNOSIS — C50412 Malignant neoplasm of upper-outer quadrant of left female breast: Secondary | ICD-10-CM | POA: Diagnosis not present

## 2019-06-12 DIAGNOSIS — Z170 Estrogen receptor positive status [ER+]: Secondary | ICD-10-CM | POA: Diagnosis not present

## 2019-06-13 ENCOUNTER — Other Ambulatory Visit: Payer: Self-pay

## 2019-06-13 ENCOUNTER — Ambulatory Visit
Admission: RE | Admit: 2019-06-13 | Discharge: 2019-06-13 | Disposition: A | Discharge status: Home / Self Care | Payer: Medicare Other | Source: Ambulatory Visit | Attending: Radiation Oncology | Admitting: Radiation Oncology

## 2019-06-13 DIAGNOSIS — Z170 Estrogen receptor positive status [ER+]: Secondary | ICD-10-CM | POA: Diagnosis not present

## 2019-06-13 DIAGNOSIS — C50412 Malignant neoplasm of upper-outer quadrant of left female breast: Secondary | ICD-10-CM | POA: Diagnosis not present

## 2019-06-13 DIAGNOSIS — Z510 Encounter for antineoplastic radiation therapy: Secondary | ICD-10-CM | POA: Diagnosis not present

## 2019-06-16 ENCOUNTER — Ambulatory Visit
Admission: RE | Admit: 2019-06-16 | Discharge: 2019-06-16 | Disposition: A | Discharge status: Home / Self Care | Payer: Medicare Other | Source: Ambulatory Visit | Attending: Radiation Oncology | Admitting: Radiation Oncology

## 2019-06-16 ENCOUNTER — Ambulatory Visit: Payer: Medicare Other | Admitting: Radiation Oncology

## 2019-06-16 ENCOUNTER — Other Ambulatory Visit: Payer: Self-pay

## 2019-06-16 DIAGNOSIS — Z170 Estrogen receptor positive status [ER+]: Secondary | ICD-10-CM | POA: Diagnosis not present

## 2019-06-16 DIAGNOSIS — Z510 Encounter for antineoplastic radiation therapy: Secondary | ICD-10-CM | POA: Diagnosis not present

## 2019-06-16 DIAGNOSIS — C50412 Malignant neoplasm of upper-outer quadrant of left female breast: Secondary | ICD-10-CM | POA: Diagnosis not present

## 2019-06-17 ENCOUNTER — Ambulatory Visit
Admission: RE | Admit: 2019-06-17 | Discharge: 2019-06-17 | Disposition: A | Discharge status: Home / Self Care | Payer: Medicare Other | Source: Ambulatory Visit | Attending: Radiation Oncology | Admitting: Radiation Oncology

## 2019-06-17 ENCOUNTER — Other Ambulatory Visit: Payer: Self-pay

## 2019-06-17 ENCOUNTER — Other Ambulatory Visit: Payer: Self-pay | Admitting: Internal Medicine

## 2019-06-17 DIAGNOSIS — C50412 Malignant neoplasm of upper-outer quadrant of left female breast: Secondary | ICD-10-CM | POA: Diagnosis not present

## 2019-06-17 DIAGNOSIS — Z510 Encounter for antineoplastic radiation therapy: Secondary | ICD-10-CM | POA: Diagnosis not present

## 2019-06-17 DIAGNOSIS — E039 Hypothyroidism, unspecified: Secondary | ICD-10-CM

## 2019-06-17 DIAGNOSIS — Z170 Estrogen receptor positive status [ER+]: Secondary | ICD-10-CM | POA: Diagnosis not present

## 2019-06-18 ENCOUNTER — Other Ambulatory Visit: Payer: Self-pay

## 2019-06-18 ENCOUNTER — Ambulatory Visit
Admission: RE | Admit: 2019-06-18 | Discharge: 2019-06-18 | Disposition: A | Discharge status: Home / Self Care | Payer: Medicare Other | Source: Ambulatory Visit | Attending: Radiation Oncology | Admitting: Radiation Oncology

## 2019-06-18 DIAGNOSIS — C50412 Malignant neoplasm of upper-outer quadrant of left female breast: Secondary | ICD-10-CM | POA: Diagnosis not present

## 2019-06-18 DIAGNOSIS — Z170 Estrogen receptor positive status [ER+]: Secondary | ICD-10-CM | POA: Diagnosis not present

## 2019-06-18 DIAGNOSIS — Z510 Encounter for antineoplastic radiation therapy: Secondary | ICD-10-CM | POA: Diagnosis not present

## 2019-06-19 ENCOUNTER — Other Ambulatory Visit: Payer: Self-pay

## 2019-06-19 ENCOUNTER — Ambulatory Visit
Admission: RE | Admit: 2019-06-19 | Discharge: 2019-06-19 | Disposition: A | Discharge status: Home / Self Care | Payer: Medicare Other | Source: Ambulatory Visit | Attending: Radiation Oncology | Admitting: Radiation Oncology

## 2019-06-19 DIAGNOSIS — C50412 Malignant neoplasm of upper-outer quadrant of left female breast: Secondary | ICD-10-CM | POA: Diagnosis not present

## 2019-06-19 DIAGNOSIS — Z510 Encounter for antineoplastic radiation therapy: Secondary | ICD-10-CM | POA: Diagnosis not present

## 2019-06-19 DIAGNOSIS — Z170 Estrogen receptor positive status [ER+]: Secondary | ICD-10-CM | POA: Diagnosis not present

## 2019-06-19 NOTE — Progress Notes (Signed)
Patient Care Team: Gayland Curry, DO as PCP - General (Geriatric Medicine) Tanda Rockers, MD as Consulting Physician (Pulmonary Disease) Deveron Furlong, NP as Nurse Practitioner (Psychiatry)  DIAGNOSIS:    ICD-10-CM   1. Malignant neoplasm of upper-outer quadrant of left breast in female, estrogen receptor positive ([place])  C50.412    Z17.0     SUMMARY OF ONCOLOGIC HISTORY: Oncology History  Malignant neoplasm of upper-outer quadrant of left breast in female, estrogen receptor positive ([place])  03/03/2019 Initial Diagnosis   Left breast pain and right nipple discharge, mammogram revealed 1.4 cm left breast mass which measured 1.7 x 1 x 1.5 cm retroareolar with mild nipple retraction, no enlarged lymph nodes: Biopsy revealed grade 3 IDC with DCIS ER 95%, PR 70%, Ki-67 10%, HER-2 positive ratio 2.3 and copy #4.2, T1CN0 stage Ia clinical stage   03/12/2019 Cancer Staging   Staging form: Breast, AJCC 8th Edition - Clinical: Stage IA (cT1c, cN0, cM0, G3, ER+, PR+, HER2+) - Signed by Gardenia Phlegm, NP on 03/12/2019   04/01/2019 Surgery   Left lumpectomy Ninfa Linden): IDC with DCIS, 2.2cm, HER2 positive by FISH, ER+ (95%), PR+ (70%), Ki67 10% involved margins, 0/1 lymph nodes negative.    05/09/2019 -  Chemotherapy   The patient had trastuzumab (HERCEPTIN) 750 mg in sodium chloride 0.9 % 250 mL chemo infusion, 735 mg, Intravenous,  Once, 2 of 17 cycles Administration: 750 mg (05/09/2019), 600 mg (05/30/2019)  for chemotherapy treatment.    05/29/2019 -  Radiation Therapy   Adjuvant XRT     CHIEF COMPLIANT: Herceptin maintenance  INTERVAL HISTORY: Andrea [name] is a 84 y.o. with above-mentioned history of left breast cancer who underwent a lumpectomy followed by a re-excision. Sh is currently undergoing radiation treatment and on adjuvant treatment with Herceptin. She presents to the clinic today for treatment.  She does not have any side effects from Herceptin.  She is here in a  wheelchair today.  REVIEW OF SYSTEMS:   Constitutional: Denies fevers, chills or abnormal weight loss, wheelchair-bound Eyes: Denies blurriness of vision Ears, nose, mouth, throat, and face: Denies mucositis or sore throat Respiratory: Denies cough, dyspnea or wheezes Cardiovascular: Denies palpitation, chest discomfort Gastrointestinal: Denies nausea, heartburn or change in bowel habits Skin: Denies abnormal skin rashes Lymphatics: Denies new lymphadenopathy or easy bruising Neurological: Denies numbness, tingling or new weaknesses Behavioral/Psych: Mood is stable, no new changes  Extremities: No lower extremity edema Breast: denies any pain or lumps or nodules in either breasts All other systems were reviewed with the patient and are negative.  I have reviewed the past medical history, past surgical history, social history and family history with the patient and they are unchanged from previous note.  ALLERGIES:  is allergic to clindamycin hcl.  MEDICATIONS:  Current Outpatient Medications  Medication Sig Dispense Refill  . anastrozole (ARIMIDEX) 1 MG tablet Take 1 tablet (1 mg total) by mouth daily. 90 tablet 3  . Brexpiprazole 1 MG TABS Take 1 mg by mouth at bedtime.     . busPIRone (BUSPAR) 5 MG tablet Take 5-10 mg by mouth See admin instructions. 10 MG IN THE MORNING, 5 MG AT DINNER    . CALCIUM CITRATE PO Take 600 mg by mouth 2 (two) times daily.    . clonazePAM (KLONOPIN) 0.5 MG tablet Take 1 tablet by mouth as needed in middle of the nite to go back to sleep 20 tablet 0  . DULoxetine (CYMBALTA) 60 MG capsule Take 2  capsules (120 mg total) by mouth daily. 180 capsule 3  . ELIQUIS 5 MG TABS tablet TAKE 1 TABLET BY MOUTH TWICE A DAY 60 tablet 5  . ferrous sulfate (FERROUSUL) 325 (65 FE) MG tablet Take 325 mg by mouth daily with breakfast.    . furosemide (LASIX) 40 MG tablet TAKE 1/2 TABLET BY MOUTH EVERY DAY 45 tablet 1  . levothyroxine (SYNTHROID) 50 MCG tablet TAKE ONE  TABLET BY MOUTH ONCE DAILY 30 MINUTES BEFORE BREAKFAST FOR THYROID 90 tablet 0  . lidocaine-prilocaine (EMLA) cream Apply to affected area once (Patient not taking: Reported on 04/22/2019) 30 g 3  . mirtazapine (REMERON) 15 MG tablet Take 7.5 mg by mouth at bedtime.  2  . Multiple Vitamins-Minerals (MULTIVITAMIN PO) Take 1 tablet by mouth daily.     [name] MYRBETRIQ 50 MG TB24 tablet TAKE 1 TABLET BY MOUTH EVERY DAY 30 tablet 3  . oxyCODONE-acetaminophen (PERCOCET) 10-325 MG tablet Take 1 tab every 4 hrs as needed for moderate pain and 2 tabs every 4 hrs as needed for severe pain 180 tablet 0  . prochlorperazine (COMPAZINE) 10 MG tablet Take 1 tablet (10 mg total) by mouth every 6 (six) hours as needed for nausea or vomiting. (Patient not taking: Reported on 04/22/2019) 30 tablet 0  . traMADol (ULTRAM) 50 MG tablet Take 1 tablet (50 mg total) by mouth every 6 (six) hours as needed. (Patient taking differently: Take 50 mg by mouth every 6 (six) hours as needed (PAIN). ) 25 tablet 0  . traZODone (DESYREL) 100 MG tablet Take 200 mg by mouth at bedtime. Take 2 tablets by mouth at bedtime    . vitamin B-12 (CYANOCOBALAMIN) 500 MCG tablet Take 500 mcg by mouth daily.     No current facility-administered medications for this visit.     PHYSICAL EXAMINATION: ECOG PERFORMANCE STATUS: 2 - Symptomatic, <50% confined to bed  Vitals:   06/20/19 1002  BP: (!) 152/57  Pulse: 61  Resp: 17  Temp: 98.3 F (36.8 C)  SpO2: 98%   Filed Weights   06/20/19 1002  Weight: 201 lb 1.6 oz (91.2 kg)    GENERAL: alert, no distress and comfortable SKIN: skin color, texture, turgor are normal, no rashes or significant lesions EYES: normal, Conjunctiva are pink and non-injected, sclera clear OROPHARYNX: no exudate, no erythema and lips, buccal mucosa, and tongue normal  NECK: supple, thyroid normal size, non-tender, without nodularity LYMPH: no palpable lymphadenopathy in the cervical, axillary or inguinal LUNGS: clear  to auscultation and percussion with normal breathing effort HEART: regular rate & rhythm and no murmurs and no lower extremity edema ABDOMEN: abdomen soft, non-tender and normal bowel sounds MUSCULOSKELETAL: no cyanosis of digits and no clubbing  NEURO: alert & oriented x 3 with fluent speech, no focal motor/sensory deficits EXTREMITIES: No lower extremity edema  LABORATORY DATA:  I have reviewed the data as listed CMP Latest Ref Rng & Units 06/20/2019 05/09/2019 03/27/2019  Glucose 70 - 99 mg/dL 112(H) 116(H) 101(H)  BUN 8 - 23 mg/dL _0 Creatinine 0.44 - 1.00 mg/dL 0.92 0.99 0.85  Sodium 135 - 145 mmol/L 141 139 140  Potassium 3.5 - 5.1 mmol/L 4.1 4.0 4.5  Chloride 98 - 111 mmol/L 104 102 105  CO2 22 - 32 mmol/L _1 Calcium 8.9 - 10.3 mg/dL 9.4 9.0 9.4  Total Protein 6.5 - 8.1 g/dL 6.3(L) 6.6 -  Total Bilirubin 0.3 - 1.2 mg/dL 0.3 0.3 -  Alkaline Phos 38 - 126 U/L 61 69 -  AST 15 - 41 U/L 16 16 -  ALT 0 - 44 U/L 10 10 -    Lab Results  Component Value Date   WBC 7.3 06/20/2019   HGB 12.2 06/20/2019   HCT 37.7 06/20/2019   MCV 87.3 06/20/2019   PLT 216 06/20/2019   NEUTROABS 5.9 06/20/2019    ASSESSMENT & PLAN:  Malignant neoplasm of upper-outer quadrant of left breast in female, estrogen receptor positive ([facility]) 03/03/2019:Left breast pain and right nipple discharge, mammogram revealed 1.4 cm left breast mass which measured 1.7 x 1 x 1.5 cm retroareolar with mild nipple retraction, no enlarged lymph nodes: Biopsy revealed grade 3 IDC with DCIS ER 95%, PR 70%, Ki-67 10%, HER-2 positive ratio 2.3 and copy #4.2, T1CN0 stage Ia clinical stage  04/01/2019: Left lumpectomy: Ninfa Linden): IDC with DCIS, 2.2cm, HER2 positive by FISH, ER+ (95%), PR+ (70%), Ki67 10% involved margins, 0/1 lymph nodes negative. 04/15/2019: Resection of the inferior margin: Benign  Treatment plan: 1.  Adjuvant Herceptin started 05/09/2019 2.  Adjuvant radiation therapy started 05/29/2019 3.   Adjuvant antiestrogen therapy.  We gave her preoperative anastrozole and she tolerated it very well. She can continue anastrozole during radiation as well.  Return to clinic every 3 weeks for Herceptin every 6 weeks of follow-up with me.    No orders of the defined types were placed in this encounter.  The patient has a good understanding of the overall plan. she agrees with it. she will call with any problems that may develop before the next visit here.  Nicholas Lose, MD 06/20/2019  Julious Oka Dorshimer am acting as scribe for Dr. Nicholas Lose.  I have reviewed the above documentation for accuracy and completeness, and I agree with the above.

## 2019-06-20 ENCOUNTER — Other Ambulatory Visit: Payer: Self-pay

## 2019-06-20 ENCOUNTER — Encounter: Payer: Self-pay | Admitting: *Deleted

## 2019-06-20 ENCOUNTER — Ambulatory Visit
Admission: RE | Admit: 2019-06-20 | Discharge: 2019-06-20 | Disposition: A | Discharge status: Home / Self Care | Payer: Medicare Other | Source: Ambulatory Visit | Attending: Radiation Oncology | Admitting: Radiation Oncology

## 2019-06-20 ENCOUNTER — Inpatient Hospital Stay: Payer: Medicare Other

## 2019-06-20 ENCOUNTER — Other Ambulatory Visit: Payer: Medicare Other

## 2019-06-20 ENCOUNTER — Ambulatory Visit: Payer: Medicare Other

## 2019-06-20 ENCOUNTER — Inpatient Hospital Stay (HOSPITAL_BASED_OUTPATIENT_CLINIC_OR_DEPARTMENT_OTHER): Payer: Medicare Other | Admitting: Hematology and Oncology

## 2019-06-20 DIAGNOSIS — Z79899 Other long term (current) drug therapy: Secondary | ICD-10-CM | POA: Diagnosis not present

## 2019-06-20 DIAGNOSIS — Z923 Personal history of irradiation: Secondary | ICD-10-CM

## 2019-06-20 DIAGNOSIS — Z79811 Long term (current) use of aromatase inhibitors: Secondary | ICD-10-CM

## 2019-06-20 DIAGNOSIS — Z7901 Long term (current) use of anticoagulants: Secondary | ICD-10-CM

## 2019-06-20 DIAGNOSIS — Z510 Encounter for antineoplastic radiation therapy: Secondary | ICD-10-CM | POA: Diagnosis not present

## 2019-06-20 DIAGNOSIS — C50412 Malignant neoplasm of upper-outer quadrant of left female breast: Secondary | ICD-10-CM

## 2019-06-20 DIAGNOSIS — Z9221 Personal history of antineoplastic chemotherapy: Secondary | ICD-10-CM | POA: Diagnosis not present

## 2019-06-20 DIAGNOSIS — Z170 Estrogen receptor positive status [ER+]: Secondary | ICD-10-CM | POA: Diagnosis not present

## 2019-06-20 DIAGNOSIS — Z95828 Presence of other vascular implants and grafts: Secondary | ICD-10-CM

## 2019-06-20 DIAGNOSIS — Z5112 Encounter for antineoplastic immunotherapy: Secondary | ICD-10-CM | POA: Diagnosis not present

## 2019-06-20 LAB — CMP (CANCER CENTER ONLY)
ALT: 10 U/L (ref 0–44)
AST: 16 U/L (ref 15–41)
Albumin: 3.2 g/dL — ABNORMAL LOW (ref 3.5–5.0)
Alkaline Phosphatase: 61 U/L (ref 38–126)
Anion gap: 10 (ref 5–15)
BUN: 20 mg/dL (ref 8–23)
CO2: 27 mmol/L (ref 22–32)
Calcium: 9.4 mg/dL (ref 8.9–10.3)
Chloride: 104 mmol/L (ref 98–111)
Creatinine: 0.92 mg/dL (ref 0.44–1.00)
GFR, Est AFR Am: >60 mL/min (ref >60)
GFR, Estimated: 58 mL/min — ABNORMAL LOW (ref >60)
Glucose, Bld: 112 mg/dL — ABNORMAL HIGH (ref 70–99)
Potassium: 4.1 mmol/L (ref 3.5–5.1)
Sodium: 141 mmol/L (ref 135–145)
Total Bilirubin: 0.3 mg/dL (ref 0.3–1.2)
Total Protein: 6.3 g/dL — ABNORMAL LOW (ref 6.5–8.1)

## 2019-06-20 LAB — CBC WITH DIFFERENTIAL (CANCER CENTER ONLY)
Abs Immature Granulocytes: 0.01 10*3/uL (ref 0.00–0.07)
Basophils Absolute: 0.1 10*3/uL (ref 0.0–0.1)
Basophils Relative: 1 %
Eosinophils Absolute: 0.2 10*3/uL (ref 0.0–0.5)
Eosinophils Relative: 2 %
HCT: 37.7 % (ref 36.0–46.0)
Hemoglobin: 12.2 g/dL (ref 12.0–15.0)
Immature Granulocytes: 0 %
Lymphocytes Relative: 10 %
Lymphs Abs: 0.7 10*3/uL (ref 0.7–4.0)
MCH: 28.2 pg (ref 26.0–34.0)
MCHC: 32.4 g/dL (ref 30.0–36.0)
MCV: 87.3 fL (ref 80.0–100.0)
Monocytes Absolute: 0.4 10*3/uL (ref 0.1–1.0)
Monocytes Relative: 6 %
Neutro Abs: 5.9 10*3/uL (ref 1.7–7.7)
Neutrophils Relative %: 81 %
Platelet Count: 216 10*3/uL (ref 150–400)
RBC: 4.32 MIL/uL (ref 3.87–5.11)
RDW: 14.1 % (ref 11.5–15.5)
WBC Count: 7.3 10*3/uL (ref 4.0–10.5)
nRBC: 0 % (ref 0.0–0.2)

## 2019-06-20 MED ORDER — SODIUM CHLORIDE 0.9% FLUSH
10.0000 mL | INTRAVENOUS | Status: DC | PRN
  Administered 2019-06-20: 13:00:00 10 mL
  Filled 2019-06-20: qty 10

## 2019-06-20 MED ORDER — SODIUM CHLORIDE 0.9% FLUSH
10.0000 mL | INTRAVENOUS | Status: DC | PRN
  Administered 2019-06-20: 10:00:00 10 mL via INTRAVENOUS
  Filled 2019-06-20: qty 10

## 2019-06-20 MED ORDER — HEPARIN SOD (PORK) LOCK FLUSH 100 UNIT/ML IV SOLN
500.0000 [IU] | Freq: Once | INTRAVENOUS | Status: AC | PRN
  Administered 2019-06-20: 13:00:00 500 [IU]
  Filled 2019-06-20: qty 5

## 2019-06-20 MED ORDER — DIPHENHYDRAMINE HCL 25 MG PO CAPS
ORAL_CAPSULE | ORAL | Status: AC
  Filled 2019-06-20: qty 1

## 2019-06-20 MED ORDER — SODIUM CHLORIDE 0.9 % IV SOLN
Freq: Once | INTRAVENOUS | Status: AC
  Administered 2019-06-20: 11:00:00 via INTRAVENOUS
  Filled 2019-06-20: qty 250

## 2019-06-20 MED ORDER — TRASTUZUMAB CHEMO 150 MG IV SOLR
600.0000 mg | Freq: Once | INTRAVENOUS | Status: AC
  Administered 2019-06-20: 12:00:00 600 mg via INTRAVENOUS
  Filled 2019-06-20: qty 14.29

## 2019-06-20 MED ORDER — DIPHENHYDRAMINE HCL 25 MG PO CAPS
25.0000 mg | ORAL_CAPSULE | Freq: Once | ORAL | Status: AC
  Administered 2019-06-20: 25 mg via ORAL

## 2019-06-20 MED ORDER — ACETAMINOPHEN 325 MG PO TABS
ORAL_TABLET | ORAL | Status: AC
  Filled 2019-06-20: qty 2

## 2019-06-20 MED ORDER — ACETAMINOPHEN 325 MG PO TABS
650.0000 mg | ORAL_TABLET | Freq: Once | ORAL | Status: AC
  Administered 2019-06-20: 11:00:00 650 mg via ORAL

## 2019-06-20 NOTE — Assessment & Plan Note (Signed)
03/03/2019:Left breast pain and right nipple discharge, mammogram revealed 1.4 cm left breast mass which measured 1.7 x 1 x 1.5 cm retroareolar with mild nipple retraction, no enlarged lymph nodes: Biopsy revealed grade 3 IDC with DCIS ER 95%, PR 70%, Ki-67 10%, HER-2 positive ratio 2.3 and copy #4.2, T1CN0 stage Ia clinical stage  04/01/2019: Left lumpectomy: Andrea [name]): IDC with DCIS, 2.2cm, HER2 positive by FISH, ER+ (95%), PR+ (70%), Ki67 10% involved margins, 0/1 lymph nodes negative. 04/15/2019: Resection of the inferior margin: Benign  Treatment plan: 1.  Adjuvant Herceptin 2.  Adjuvant radiation therapy followed by 3.  Adjuvant antiestrogen therapy.  We gave her preoperative anastrozole and she tolerated it very well. She can continue anastrozole during radiation as well.  Return to clinic every 3 weeks for Herceptin every 6 weeks of follow-up with me.

## 2019-06-20 NOTE — Patient Instructions (Signed)
[facility] Discharge Instructions for Patients Receiving Chemotherapy  Today you received the following chemotherapy agents :  Herceptin.  To help prevent nausea and vomiting after your treatment, we encourage you to take your nausea medication as prescribed.   If you develop nausea and vomiting that is not controlled by your nausea medication, call the clinic.   BELOW ARE SYMPTOMS THAT SHOULD BE REPORTED IMMEDIATELY:  *FEVER GREATER THAN 100.5 F  *CHILLS WITH OR WITHOUT FEVER  NAUSEA AND VOMITING THAT IS NOT CONTROLLED WITH YOUR NAUSEA MEDICATION  *UNUSUAL SHORTNESS OF BREATH  *UNUSUAL BRUISING OR BLEEDING  TENDERNESS IN MOUTH AND THROAT WITH OR WITHOUT PRESENCE OF ULCERS  *URINARY PROBLEMS  *BOWEL PROBLEMS  UNUSUAL RASH Items with * indicate a potential emergency and should be followed up as soon as possible.  Feel free to call the clinic should you have any questions or concerns. The clinic phone number is [phone].  Please show the CHEMO ALERT CARD at check-in to the Emergency Department and triage nurse.   Trastuzumab injection for infusion What is this medicine? TRASTUZUMAB (tras TOO zoo mab) is a monoclonal antibody. It is used to treat breast cancer and stomach cancer. This medicine may be used for other purposes; ask your health care provider or pharmacist if you have questions. COMMON BRAND NAME(S): Herceptin, KANJINTI, OGIVRI What should I tell my health care provider before I take this medicine? They need to know if you have any of these conditions: -heart disease -heart failure -lung or breathing disease, like asthma -an unusual or allergic reaction to trastuzumab, benzyl alcohol, or other medications, foods, dyes, or preservatives -pregnant or trying to get pregnant -breast-feeding How should I use this medicine? This drug is given as an infusion into a vein. It is administered in a hospital or clinic by a specially trained health  care professional. Talk to your pediatrician regarding the use of this medicine in children. This medicine is not approved for use in children. Overdosage: If you think you have taken too much of this medicine contact a poison control center or emergency room at once. NOTE: This medicine is only for you. Do not share this medicine with others. What if I miss a dose? It is important not to miss a dose. Call your doctor or health care professional if you are unable to keep an appointment. What may interact with this medicine? This medicine may interact with the following medications: -certain types of chemotherapy, such as daunorubicin, doxorubicin, epirubicin, and idarubicin This list may not describe all possible interactions. Give your health care provider a list of all the medicines, herbs, non-prescription drugs, or dietary supplements you use. Also tell them if you smoke, drink alcohol, or use illegal drugs. Some items may interact with your medicine. What should I watch for while using this medicine? Visit your doctor for checks on your progress. Report any side effects. Continue your course of treatment even though you feel ill unless your doctor tells you to stop. Call your doctor or health care professional for advice if you get a fever, chills or sore throat, or other symptoms of a cold or flu. Do not treat yourself. Try to avoid being around people who are sick. You may experience fever, chills and shaking during your first infusion. These effects are usually mild and can be treated with other medicines. Report any side effects during the infusion to your health care professional. Fever and chills usually do not happen with later infusions.   Do not become pregnant while taking this medicine or for 7 months after stopping it. Women should inform their doctor if they wish to become pregnant or think they might be pregnant. Women of child-bearing potential will need to have a negative pregnancy  test before starting this medicine. There is a potential for serious side effects to an unborn child. Talk to your health care professional or pharmacist for more information. Do not breast-feed an infant while taking this medicine or for 7 months after stopping it. Women must use effective birth control with this medicine. What side effects may I notice from receiving this medicine? Side effects that you should report to your doctor or health care professional as soon as possible: -allergic reactions like skin rash, itching or hives, swelling of the face, lips, or tongue -chest pain or palpitations -cough -dizziness -feeling faint or lightheaded, falls -fever -general ill feeling or flu-like symptoms -signs of worsening heart failure like breathing problems; swelling in your legs and feet -unusually weak or tired Side effects that usually do not require medical attention (report to your doctor or health care professional if they continue or are bothersome): -bone pain -changes in taste -diarrhea -joint pain -nausea/vomiting -weight loss This list may not describe all possible side effects. Call your doctor for medical advice about side effects. You may report side effects to FDA at 1-800-FDA-1088. Where should I keep my medicine? This drug is given in a hospital or clinic and will not be stored at home. NOTE: This sheet is a summary. It may not cover all possible information. If you have questions about this medicine, talk to your doctor, pharmacist, or health care provider.  2019 Elsevier/Gold Standard (2016-10-31 14:37:52)  

## 2019-06-22 NOTE — Progress Notes (Signed)
The following biosimilar Ogivri (trastuzumab-dkst) has been selected for use in this patient.  Kennith Center, Pharm.D., CPP 06/22/2019@9 :20 AM

## 2019-06-23 ENCOUNTER — Other Ambulatory Visit: Payer: Self-pay

## 2019-06-23 ENCOUNTER — Ambulatory Visit
Admission: RE | Admit: 2019-06-23 | Discharge: 2019-06-23 | Disposition: A | Discharge status: Home / Self Care | Payer: Medicare Other | Source: Ambulatory Visit | Attending: Radiation Oncology | Admitting: Radiation Oncology

## 2019-06-23 DIAGNOSIS — Z510 Encounter for antineoplastic radiation therapy: Secondary | ICD-10-CM | POA: Diagnosis not present

## 2019-06-23 DIAGNOSIS — Z170 Estrogen receptor positive status [ER+]: Secondary | ICD-10-CM | POA: Diagnosis not present

## 2019-06-23 DIAGNOSIS — C50412 Malignant neoplasm of upper-outer quadrant of left female breast: Secondary | ICD-10-CM | POA: Diagnosis not present

## 2019-06-24 ENCOUNTER — Encounter: Payer: Self-pay | Admitting: Radiation Oncology

## 2019-06-24 ENCOUNTER — Ambulatory Visit
Admission: RE | Admit: 2019-06-24 | Discharge: 2019-06-24 | Disposition: A | Discharge status: Home / Self Care | Payer: Medicare Other | Source: Ambulatory Visit | Attending: Radiation Oncology | Admitting: Radiation Oncology

## 2019-06-24 ENCOUNTER — Encounter: Payer: Self-pay | Admitting: *Deleted

## 2019-06-24 ENCOUNTER — Other Ambulatory Visit: Payer: Self-pay

## 2019-06-24 DIAGNOSIS — Z170 Estrogen receptor positive status [ER+]: Secondary | ICD-10-CM | POA: Diagnosis not present

## 2019-06-24 DIAGNOSIS — Z510 Encounter for antineoplastic radiation therapy: Secondary | ICD-10-CM | POA: Diagnosis not present

## 2019-06-24 DIAGNOSIS — C50412 Malignant neoplasm of upper-outer quadrant of left female breast: Secondary | ICD-10-CM | POA: Diagnosis not present

## 2019-06-27 ENCOUNTER — Other Ambulatory Visit: Payer: Self-pay | Admitting: Nurse Practitioner

## 2019-06-27 DIAGNOSIS — Z7901 Long term (current) use of anticoagulants: Secondary | ICD-10-CM

## 2019-06-30 ENCOUNTER — Other Ambulatory Visit: Payer: Self-pay

## 2019-06-30 ENCOUNTER — Other Ambulatory Visit: Payer: Medicare Other

## 2019-06-30 DIAGNOSIS — N3281 Overactive bladder: Secondary | ICD-10-CM

## 2019-06-30 DIAGNOSIS — D539 Nutritional anemia, unspecified: Secondary | ICD-10-CM | POA: Diagnosis not present

## 2019-06-30 DIAGNOSIS — I480 Paroxysmal atrial fibrillation: Secondary | ICD-10-CM

## 2019-06-30 DIAGNOSIS — D649 Anemia, unspecified: Secondary | ICD-10-CM

## 2019-06-30 DIAGNOSIS — N644 Mastodynia: Secondary | ICD-10-CM

## 2019-06-30 DIAGNOSIS — G894 Chronic pain syndrome: Secondary | ICD-10-CM | POA: Diagnosis not present

## 2019-07-01 LAB — IRON,TIBC AND FERRITIN PANEL
%SAT: 16 % (calc) (ref 16–45)
Ferritin: 43 ng/mL (ref 16–288)
Iron: 56 ug/dL (ref 45–160)
TIBC: 345 mcg/dL (calc) (ref 250–450)

## 2019-07-01 LAB — CBC WITH DIFFERENTIAL/PLATELET
Absolute Monocytes: 429 cells/uL (ref 200–950)
Basophils Absolute: 41 cells/uL (ref 0–200)
Basophils Relative: 0.7 %
Eosinophils Absolute: 151 cells/uL (ref 15–500)
Eosinophils Relative: 2.6 %
HCT: 37.2 % (ref 35.0–45.0)
Hemoglobin: 12.3 g/dL (ref 11.7–15.5)
Lymphs Abs: 905 cells/uL (ref 850–3900)
MCH: 28.5 pg (ref 27.0–33.0)
MCHC: 33.1 g/dL (ref 32.0–36.0)
MCV: 86.3 fL (ref 80.0–100.0)
MPV: 10.1 fL (ref 7.5–12.5)
Monocytes Relative: 7.4 %
Neutro Abs: 4275 cells/uL (ref 1500–7800)
Neutrophils Relative %: 73.7 %
Platelets: 220 10*3/uL (ref 140–400)
RBC: 4.31 10*6/uL (ref 3.80–5.10)
RDW: 14.1 % (ref 11.0–15.0)
Total Lymphocyte: 15.6 %
WBC: 5.8 10*3/uL (ref 3.8–10.8)

## 2019-07-01 LAB — BASIC METABOLIC PANEL
BUN/Creatinine Ratio: 29 (calc) — ABNORMAL HIGH (ref 6–22)
BUN: 27 mg/dL — ABNORMAL HIGH (ref 7–25)
CO2: 25 mmol/L (ref 20–32)
Calcium: 10.2 mg/dL (ref 8.6–10.4)
Chloride: 104 mmol/L (ref 98–110)
Creat: 0.94 mg/dL — ABNORMAL HIGH (ref 0.60–0.88)
Glucose, Bld: 126 mg/dL — ABNORMAL HIGH (ref 65–99)
Potassium: 4.2 mmol/L (ref 3.5–5.3)
Sodium: 139 mmol/L (ref 135–146)

## 2019-07-01 LAB — VITAMIN B12: Vitamin B-12: 1041 pg/mL (ref 200–1100)

## 2019-07-03 ENCOUNTER — Encounter: Payer: Self-pay | Admitting: Internal Medicine

## 2019-07-03 ENCOUNTER — Other Ambulatory Visit: Payer: Self-pay

## 2019-07-03 ENCOUNTER — Ambulatory Visit (INDEPENDENT_AMBULATORY_CARE_PROVIDER_SITE_OTHER): Payer: Medicare Other | Admitting: Internal Medicine

## 2019-07-03 VITALS — BP 128/80 | HR 72 | Temp 98.3°F | Ht 61.0 in | Wt 201.0 lb

## 2019-07-03 DIAGNOSIS — Z9181 History of falling: Secondary | ICD-10-CM | POA: Insufficient documentation

## 2019-07-03 DIAGNOSIS — C50412 Malignant neoplasm of upper-outer quadrant of left female breast: Secondary | ICD-10-CM

## 2019-07-03 DIAGNOSIS — E039 Hypothyroidism, unspecified: Secondary | ICD-10-CM

## 2019-07-03 DIAGNOSIS — N3281 Overactive bladder: Secondary | ICD-10-CM | POA: Diagnosis not present

## 2019-07-03 DIAGNOSIS — F17210 Nicotine dependence, cigarettes, uncomplicated: Secondary | ICD-10-CM

## 2019-07-03 DIAGNOSIS — D649 Anemia, unspecified: Secondary | ICD-10-CM

## 2019-07-03 DIAGNOSIS — G8929 Other chronic pain: Secondary | ICD-10-CM

## 2019-07-03 DIAGNOSIS — M5441 Lumbago with sciatica, right side: Secondary | ICD-10-CM

## 2019-07-03 DIAGNOSIS — G894 Chronic pain syndrome: Secondary | ICD-10-CM | POA: Diagnosis not present

## 2019-07-03 DIAGNOSIS — Z170 Estrogen receptor positive status [ER+]: Secondary | ICD-10-CM | POA: Diagnosis not present

## 2019-07-03 DIAGNOSIS — I480 Paroxysmal atrial fibrillation: Secondary | ICD-10-CM

## 2019-07-03 DIAGNOSIS — Z716 Tobacco abuse counseling: Secondary | ICD-10-CM

## 2019-07-03 NOTE — Progress Notes (Signed)
Location:  [place] [facility]   Advanced Directives 05/20/2019  Does Patient Have a Medical Advance Directive? Yes  Type of Paramedic of [place];Living will  Does patient want to make changes to medical advance directive? -  Copy of [place] in Chart? Yes - validated most recent copy scanned in chart (See row information)  Would patient like information on creating a medical advance directive? -  Pre-existing out of facility DNR order (yellow form or pink MOST form) -     Chief Complaint  Patient presents with  . Medical Management of Chronic Issues    27mth follow-up    HPI: Patient is a 84 y.o. female seen today for medical management of chronic diseases.    She is currently followed by Dr. Lindi Adie for breast cancer of the left breast. Radiation just recently completed. Chemotherapy being done every 3 weeks, with next scheduled 08/01/2019. At times she feels tired and weak. She is taking her anastrozole daily.    At times her appetite is poor. Her daughter tries to get her to eat one meal a day. She also drinks a few glasses of water a day.   Incontinence continues to be an issue for her. She is being followed by urology and plans to see them 07/08/2019. She uses pads and depends briefs throughout the day. She is taking myrbetriq and vesicare daily as prescribed. Her lasix was stopped.  Incontinent issues are worse at night. Daughter claims she has almost fallen trying to get to the bathroom.   No recent falls or injuries. She uses a front wheeled walker to ambulate.   Right sciatica pain has been an issue for a month. She does not know how it began. She has been icing the area and taking her percocet. She also rests when the pain persists.   The stress and anxiety of her breast cancer treatments have brought her back to smoking. She is smoking 4-5 cigarettes a day. A dry cough with expiratory wheezing has also started back with her smoking. She  is not taking any medications to control the cough or wheezing.      Past Medical History:  Diagnosis Date  . A-fib ([place])   . Anemia   . Anxiety   . Arthritis    hands, spine   . Bladder incontinence   . Blood transfusion   . Cancer ([place]) 02/2019   left breast cancer  . Cervicalgia   . Constipation   . Cough    Wert-onset 08/2009, as of 2014- resolved   . Deep venous thrombosis ([place])    post op, rec'd/needed  blood thinner   . Depression   . Depression with anxiety   . Dysrhythmia    a-fib  . Falls frequently    pt. reports that she was falling 3 times a day, has had PT at a facility for a while & now is getting home PT 2-3 times/ week   . Headache(784.0) 09/27/2011  . History of stress test    15-20 yrs.ago   . Hypotension   . Hypothyroidism   . Lumbar radiculopathy 09/26/2011  . Pneumonia    never been in hosp. for pneumonia   . Prediabetes   . Right hand fracture    Monday  . Shortness of breath   . Sleep apnea    does not use consistently    Past Surgical History:  Procedure Laterality Date  . ANTERIOR CERVICAL DECOMP/DISCECTOMY FUSION N/A 07/09/2013  Procedure: ANTERIOR CERVICAL DECOMPRESSION/DISCECTOMY FUSION 3 LEVELS;  Surgeon: Sinclair Ship, MD;  Location: [facility];  Service: Orthopedics;  Laterality: N/A;  Anterior cervical decompression fusion, cervical 3-4, cervical 4-5-, cervical 5-6 with instrumentation, allograft.  . APPENDECTOMY    . BACK SURGERY     2000  . BREAST EXCISIONAL BIOPSY     breast bxs on left x 2, breast bx of right x 1-all benign  . EYE SURGERY     cataracts bilateral /w IOL  . HERNIA REPAIR     umbilical hernia- [id number]  . IR IMAGING GUIDED PORT INSERTION  04/28/2019  . JOINT REPLACEMENT  2006,2007   bilateral  . LAPAROSCOPIC OVARIAN CYSTECTOMY     not done by laparscopy-abdominal incision  . PORTACATH PLACEMENT Right 04/01/2019   Procedure: ATTEMPTED INSERTION PORT-A-CATH WITH ULTRASOUND;  Surgeon: [name], MD;   Location: [place];  Service: General;  Laterality: Right;  . RADIOACTIVE SEED GUIDED PARTIAL MASTECTOMY WITH AXILLARY SENTINEL LYMPH NODE BIOPSY Left 04/01/2019   Procedure: LEFT BREAST PARTIAL MASTECTOMY WITH RADIOACTIVE SEED AND LEFT SENTINEL LYMPH NODE BIOPSY;  Surgeon: [name], MD;  Location: [place];  Service: General;  Laterality: Left;  . RE-EXCISION OF BREAST CANCER,SUPERIOR MARGINS Left 04/15/2019   Procedure: RE-EXCISION OF LEFT BREAST CANCER POSITIVE MARGINS;  Surgeon: [name], MD;  Location: [place];  Service: General;  Laterality: Left;  . REPLACEMENT TOTAL KNEE BILATERAL    . TONSILLECTOMY      Allergies  Allergen Reactions  . Clindamycin Hcl Other (See Comments)    REACTION: swelling, pt. Reports that she had a 16 lb. weightgain in one day    Outpatient Encounter Medications as of 07/03/2019  Medication Sig  . anastrozole (ARIMIDEX) 1 MG tablet Take 1 tablet (1 mg total) by mouth daily.  . Brexpiprazole 1 MG TABS Take 1 mg by mouth at bedtime.   . busPIRone (BUSPAR) 5 MG tablet Take 5-10 mg by mouth See admin instructions. 10 MG IN THE MORNING, 5 MG AT DINNER  . CALCIUM CITRATE PO Take 600 mg by mouth 2 (two) times daily.  . clonazePAM (KLONOPIN) 0.5 MG tablet Take 1 tablet by mouth as needed in middle of the nite to go back to sleep  . DULoxetine (CYMBALTA) 60 MG capsule Take 2 capsules (120 mg total) by mouth daily.  [name] ELIQUIS 5 MG TABS tablet TAKE 1 TABLET BY MOUTH TWICE A DAY  . ferrous sulfate (FERROUSUL) 325 (65 FE) MG tablet Take 325 mg by mouth daily with breakfast.  . furosemide (LASIX) 40 MG tablet TAKE 1/2 TABLET BY MOUTH EVERY DAY  . levothyroxine (SYNTHROID) 50 MCG tablet TAKE ONE TABLET BY MOUTH ONCE DAILY 30 MINUTES BEFORE BREAKFAST FOR THYROID  . mirtazapine (REMERON) 15 MG tablet Take 7.5 mg by mouth at bedtime.  . Multiple Vitamins-Minerals (MULTIVITAMIN PO) Take 1 tablet by mouth  daily.   [name] oxyCODONE-acetaminophen (PERCOCET) 10-325 MG tablet Take 1 tab every 4 hrs as needed for moderate pain and 2 tabs every 4 hrs as needed for severe pain  . solifenacin (VESICARE) 10 MG tablet Take 1 tablet by mouth daily.  . traZODone (DESYREL) 100 MG tablet Take 200 mg by mouth at bedtime. Take 2 tablets by mouth at bedtime  . vitamin B-12 (CYANOCOBALAMIN) 500 MCG tablet Take 500 mcg by mouth daily.  . [DISCONTINUED] MYRBETRIQ 50 MG TB24 tablet TAKE 1 TABLET BY MOUTH EVERY DAY  . [DISCONTINUED] traMADol Veatrice Bourbon)  50 MG tablet Take 1 tablet (50 mg total) by mouth every 6 (six) hours as needed.  . [DISCONTINUED] lidocaine-prilocaine (EMLA) cream Apply to affected area once (Patient not taking: Reported on 04/22/2019)  . [DISCONTINUED] prochlorperazine (COMPAZINE) 10 MG tablet Take 1 tablet (10 mg total) by mouth every 6 (six) hours as needed for nausea or vomiting. (Patient not taking: Reported on 04/22/2019)   No facility-administered encounter medications on file as of 07/03/2019.     Review of Systems:  Review of Systems  Constitutional: Positive for activity change, appetite change and fatigue.  HENT: Negative for dental problem, hearing loss and trouble swallowing.   Respiratory: Positive for cough. Negative for shortness of breath and wheezing.   Cardiovascular: Positive for leg swelling. Negative for chest pain and palpitations.  Gastrointestinal: Negative for constipation, diarrhea and nausea.  Genitourinary: Positive for frequency and urgency. Negative for dysuria and hematuria.  Musculoskeletal: Positive for arthralgias, gait problem and myalgias.       Right sciatica pain, frequent falls   Neurological: Positive for weakness. Negative for light-headedness and headaches.  Hematological: Bruises/bleeds easily.  Psychiatric/Behavioral: Negative for dysphoric mood and sleep disturbance. The patient is not nervous/anxious.     Health Maintenance  Topic Date Due  . OPHTHALMOLOGY  EXAM  01/02/2019  . FOOT EXAM  02/15/2019  . HEMOGLOBIN A1C  04/16/2019  . INFLUENZA VACCINE  06/21/2019  . URINE MICROALBUMIN  10/22/2019  . TETANUS/TDAP  11/27/2026  . DEXA SCAN  Completed  . PNA vac Low Risk Adult  Completed    Physical Exam: Vitals:   07/03/19 0953  BP: 128/80  Pulse: 72  Temp: 98.3 F (36.8 C)  TempSrc: Oral  SpO2: 96%  Weight: 201 lb (91.2 kg)  Height: 5\' 1"  (1.549 m)   Body mass index is 37.98 kg/m. Physical Exam Vitals signs reviewed.  Constitutional:      General: She is not in acute distress.    Appearance: Normal appearance. She is normal weight. She is not ill-appearing.  HENT:     Head: Normocephalic.     Mouth/Throat:     Mouth: Mucous membranes are moist.  Cardiovascular:     Rate and Rhythm: Normal rate and regular rhythm.     Pulses: Normal pulses.     Heart sounds: Normal heart sounds. No murmur.  Pulmonary:     Effort: Pulmonary effort is normal.     Breath sounds: Normal breath sounds.     Comments: Expiratory wheezing with bilateral upper lobes Abdominal:     General: Abdomen is flat. Bowel sounds are normal.     Palpations: Abdomen is soft.  Musculoskeletal: Normal range of motion.        General: Tenderness present.     Right lower leg: Edema present.     Left lower leg: Edema present.     Comments: Tenderness with palpation of right flank and sciatica. Pain rated a 4/10. No effusion or skin discoloration to area.  Skin:    General: Skin is warm and dry.  Neurological:     General: No focal deficit present.     Mental Status: She is alert and oriented to person, place, and time.     Sensory: No sensory deficit.     Motor: Weakness present.     Coordination: Coordination abnormal.     Gait: Gait abnormal.  Psychiatric:        Attention and Perception: Attention and perception normal.  Mood and Affect: Affect is flat.        Speech: Speech normal.        Behavior: Behavior normal. Behavior is cooperative.      Labs reviewed: Basic Metabolic Panel: Recent Labs    10/16/18 0849 02/20/19 0843  05/09/19 0920 06/20/19 0936 06/30/19 1005  NA 141 140   < > 139 141 139  K 4.0 3.8   < > 4.0 4.1 4.2  CL 102 102   < > 102 104 104  CO2 30 31   < > 28 27 25   GLUCOSE 96 109*   < > 116* 112* 126*  BUN 31* 22   < > 16 20 27*  CREATININE 0.94* 0.95*   < > 0.99 0.92 0.94*  CALCIUM 9.2 9.4   < > 9.0 9.4 10.2  TSH 2.00 2.27  --   --   --   --    < > = values in this interval not displayed.   Liver Function Tests: Recent Labs    10/16/18 0849 05/09/19 0920 06/20/19 0936  AST 17 16 16   ALT 8 10 10   ALKPHOS  --  69 61  BILITOT 0.4 0.3 0.3  PROT 6.3 6.6 6.3*  ALBUMIN  --  3.3* 3.2*   No results for input(s): LIPASE, AMYLASE in the last 8760 hours. No results for input(s): AMMONIA in the last 8760 hours. CBC: Recent Labs    05/09/19 0920 06/20/19 0936 06/30/19 1005  WBC 8.7 7.3 5.8  NEUTROABS 6.9 5.9 4,275  HGB 12.5 12.2 12.3  HCT 39.0 37.7 37.2  MCV 87.2 87.3 86.3  PLT 242 216 220   Lipid Panel: Recent Labs    10/16/18 0849  CHOL 172  HDL 56  LDLCALC 97  TRIG 98  CHOLHDL 3.1   Lab Results  Component Value Date   HGBA1C 5.1 10/16/2018    Procedures since last visit: No results found.  Assessment/Plan 1. Malignant neoplasm of upper- outer quadrant of left breast in female, estrogen receptor positive ([place]) - ongoing, followed by Dr. Lindi Adie - continue antiestrogen therapy and scheduled treatments  2. Chronic pain syndrome - stable with current regimen - continue to monitor - continue cymbalta  3. Overactive bladder - followed by urology - continue with myrbetriq and vesicare until seen by urology - encourage using bedside commode at night time to prevent falls with urinary urgency issues - continue to hold lasix unless swelling in ankles occurs  - home health PT/OT recommended, but refused by patient   4. Sciatica, right side - may use topicals for pain - can use  percocet for breakthrough pain - contact PCP if pain persists or does not subside  5. Nicotine dependence, unspecified - counseled patient for 3-10 minutes about smoking cessation and negative health impacts from it - encourage smoking cessation using nicotine path, gum, or lozenges - cough is most likely associated with smoking - encourage other ways to manage stress like reading, meditation, listening to music  6. Normocytic anemia - on eliquis - no symptoms of bleeding - h/h, iron, b12 within normal limits - continue vitamin b12 tablet daily - vitamin B12- future  7. Paroxysmal A-fib ([facility]) - continue eliquis - monitor h/h - complete blood count with differential/platelets  8. Adult hypothyroidism - continue levothyroxine, tsh at goal -TSH, free T3/T4- future  9. History of falling - urinary urgency, medications, and chronic conditions make her a high risk for falling - falls risk plan discussed  with patient and daughter - recommend bedside commode at night with assistance of daughter - recommend supervision with patient when daughter is at work    Labs/tests ordered:  Complete blood count with differential /platelets, complete metabolic count with GFR, TSH, free T3/T4, vitamin B12 Next appt:  2 month follow up

## 2019-07-03 NOTE — Patient Instructions (Signed)
I recommend home health PT, OT. Please work on smoking cessation.  I think that's why you're coughing again now. I'll be on the lookout for what urology says when you return there. Please call your daughter to help you when you get up at night and use the bedside commode.   Use topicals for the right-sided back pain.  Use the percocet as backup.

## 2019-07-08 DIAGNOSIS — N3946 Mixed incontinence: Secondary | ICD-10-CM | POA: Diagnosis not present

## 2019-07-08 DIAGNOSIS — R350 Frequency of micturition: Secondary | ICD-10-CM | POA: Diagnosis not present

## 2019-07-11 ENCOUNTER — Other Ambulatory Visit: Payer: Self-pay | Admitting: *Deleted

## 2019-07-11 ENCOUNTER — Inpatient Hospital Stay: Payer: Medicare Other | Attending: Hematology and Oncology

## 2019-07-11 ENCOUNTER — Other Ambulatory Visit: Payer: Self-pay

## 2019-07-11 VITALS — BP 129/55 | HR 62 | Temp 98.9°F | Resp 18

## 2019-07-11 DIAGNOSIS — G894 Chronic pain syndrome: Secondary | ICD-10-CM

## 2019-07-11 DIAGNOSIS — Z5112 Encounter for antineoplastic immunotherapy: Secondary | ICD-10-CM | POA: Diagnosis not present

## 2019-07-11 DIAGNOSIS — Z9221 Personal history of antineoplastic chemotherapy: Secondary | ICD-10-CM | POA: Insufficient documentation

## 2019-07-11 DIAGNOSIS — Z170 Estrogen receptor positive status [ER+]: Secondary | ICD-10-CM | POA: Insufficient documentation

## 2019-07-11 DIAGNOSIS — Z923 Personal history of irradiation: Secondary | ICD-10-CM | POA: Insufficient documentation

## 2019-07-11 DIAGNOSIS — Z79811 Long term (current) use of aromatase inhibitors: Secondary | ICD-10-CM | POA: Diagnosis not present

## 2019-07-11 DIAGNOSIS — C50412 Malignant neoplasm of upper-outer quadrant of left female breast: Secondary | ICD-10-CM | POA: Diagnosis not present

## 2019-07-11 MED ORDER — ACETAMINOPHEN 325 MG PO TABS
ORAL_TABLET | ORAL | Status: AC
  Filled 2019-07-11: qty 2

## 2019-07-11 MED ORDER — SODIUM CHLORIDE 0.9% FLUSH
10.0000 mL | INTRAVENOUS | Status: DC | PRN
  Administered 2019-07-11: 10 mL
  Filled 2019-07-11: qty 10

## 2019-07-11 MED ORDER — OXYCODONE-ACETAMINOPHEN 10-325 MG PO TABS: ORAL_TABLET | ORAL | 0 refills | Status: DC

## 2019-07-11 MED ORDER — HEPARIN SOD (PORK) LOCK FLUSH 100 UNIT/ML IV SOLN
500.0000 [IU] | Freq: Once | INTRAVENOUS | Status: AC | PRN
  Administered 2019-07-11: 500 [IU]
  Filled 2019-07-11: qty 5

## 2019-07-11 MED ORDER — ACETAMINOPHEN 325 MG PO TABS
650.0000 mg | ORAL_TABLET | Freq: Once | ORAL | Status: AC
  Administered 2019-07-11: 650 mg via ORAL

## 2019-07-11 MED ORDER — SODIUM CHLORIDE 0.9 % IV SOLN
Freq: Once | INTRAVENOUS | Status: AC
  Administered 2019-07-11: 09:00:00 via INTRAVENOUS
  Filled 2019-07-11: qty 250

## 2019-07-11 MED ORDER — DIPHENHYDRAMINE HCL 25 MG PO CAPS
ORAL_CAPSULE | ORAL | Status: AC
  Filled 2019-07-11: qty 1

## 2019-07-11 MED ORDER — DIPHENHYDRAMINE HCL 25 MG PO CAPS
25.0000 mg | ORAL_CAPSULE | Freq: Once | ORAL | Status: AC
  Administered 2019-07-11: 25 mg via ORAL

## 2019-07-11 MED ORDER — TRASTUZUMAB-DKST CHEMO 150 MG IV SOLR
600.0000 mg | Freq: Once | INTRAVENOUS | Status: AC
  Administered 2019-07-11: 10:00:00 600 mg via INTRAVENOUS
  Filled 2019-07-11: qty 28.57

## 2019-07-11 NOTE — Telephone Encounter (Signed)
Last filled 06/11/19

## 2019-07-11 NOTE — Patient Instructions (Signed)
[name] Discharge Instructions for Patients Receiving Chemotherapy  Today you received the following chemotherapy agents Herceptin.  To help prevent nausea and vomiting after your treatment, we encourage you to take your nausea medication as prescribed.   If you develop nausea and vomiting that is not controlled by your nausea medication, call the clinic.   BELOW ARE SYMPTOMS THAT SHOULD BE REPORTED IMMEDIATELY:  *FEVER GREATER THAN 100.5 F  *CHILLS WITH OR WITHOUT FEVER  NAUSEA AND VOMITING THAT IS NOT CONTROLLED WITH YOUR NAUSEA MEDICATION  *UNUSUAL SHORTNESS OF BREATH  *UNUSUAL BRUISING OR BLEEDING  TENDERNESS IN MOUTH AND THROAT WITH OR WITHOUT PRESENCE OF ULCERS  *URINARY PROBLEMS  *BOWEL PROBLEMS  UNUSUAL RASH Items with * indicate a potential emergency and should be followed up as soon as possible.  Feel free to call the clinic should you have any questions or concerns. The clinic phone number is [phone].  Please show the [place] at check-in to the Emergency Department and triage nurse.  Trastuzumab injection for infusion What is this medicine? TRASTUZUMAB (tras TOO zoo mab) is a monoclonal antibody. It is used to treat breast cancer and stomach cancer. This medicine may be used for other purposes; ask your health care provider or pharmacist if you have questions. COMMON BRAND NAME(S): Herceptin, Calla Kicks, OGIVRI What should I tell my health care provider before I take this medicine? They need to know if you have any of these conditions: -heart disease -heart failure -lung or breathing disease, like asthma -an unusual or allergic reaction to trastuzumab, benzyl alcohol, or other medications, foods, dyes, or preservatives -pregnant or trying to get pregnant -breast-feeding How should I use this medicine? This drug is given as an infusion into a vein. It is administered in a hospital or clinic by a specially trained health care  professional. Talk to your pediatrician regarding the use of this medicine in children. This medicine is not approved for use in children. Overdosage: If you think you have taken too much of this medicine contact a poison control center or emergency room at once. NOTE: This medicine is only for you. Do not share this medicine with others. What if I miss a dose? It is important not to miss a dose. Call your doctor or health care professional if you are unable to keep an appointment. What may interact with this medicine? This medicine may interact with the following medications: -certain types of chemotherapy, such as daunorubicin, doxorubicin, epirubicin, and idarubicin This list may not describe all possible interactions. Give your health care provider a list of all the medicines, herbs, non-prescription drugs, or dietary supplements you use. Also tell them if you smoke, drink alcohol, or use illegal drugs. Some items may interact with your medicine. What should I watch for while using this medicine? Visit your doctor for checks on your progress. Report any side effects. Continue your course of treatment even though you feel ill unless your doctor tells you to stop. Call your doctor or health care professional for advice if you get a fever, chills or sore throat, or other symptoms of a cold or flu. Do not treat yourself. Try to avoid being around people who are sick. You may experience fever, chills and shaking during your first infusion. These effects are usually mild and can be treated with other medicines. Report any side effects during the infusion to your health care professional. Fever and chills usually do not happen with later infusions. Do not become  pregnant while taking this medicine or for 7 months after stopping it. Women should inform their doctor if they wish to become pregnant or think they might be pregnant. Women of child-bearing potential will need to have a negative pregnancy test  before starting this medicine. There is a potential for serious side effects to an unborn child. Talk to your health care professional or pharmacist for more information. Do not breast-feed an infant while taking this medicine or for 7 months after stopping it. Women must use effective birth control with this medicine. What side effects may I notice from receiving this medicine? Side effects that you should report to your doctor or health care professional as soon as possible: -allergic reactions like skin rash, itching or hives, swelling of the face, lips, or tongue -chest pain or palpitations -cough -dizziness -feeling faint or lightheaded, falls -fever -general ill feeling or flu-like symptoms -signs of worsening heart failure like breathing problems; swelling in your legs and feet -unusually weak or tired Side effects that usually do not require medical attention (report to your doctor or health care professional if they continue or are bothersome): -bone pain -changes in taste -diarrhea -joint pain -nausea/vomiting -weight loss This list may not describe all possible side effects. Call your doctor for medical advice about side effects. You may report side effects to FDA at 1-800-FDA-1088. Where should I keep my medicine? This drug is given in a hospital or clinic and will not be stored at home. NOTE: This sheet is a summary. It may not cover all possible information. If you have questions about this medicine, talk to your doctor, pharmacist, or health care provider.  2019 Elsevier/Gold Standard (2016-10-31 14:37:52)  [facility] Discharge Instructions for Patients Receiving Chemotherapy  Today you received the following chemotherapy agents Herceptin.  To help prevent nausea and vomiting after your treatment, we encourage you to take your nausea medication as prescribed.   If you develop nausea and vomiting that is not controlled by your nausea medication, call the  clinic.   BELOW ARE SYMPTOMS THAT SHOULD BE REPORTED IMMEDIATELY:  *FEVER GREATER THAN 100.5 F  *CHILLS WITH OR WITHOUT FEVER  NAUSEA AND VOMITING THAT IS NOT CONTROLLED WITH YOUR NAUSEA MEDICATION  *UNUSUAL SHORTNESS OF BREATH  *UNUSUAL BRUISING OR BLEEDING  TENDERNESS IN MOUTH AND THROAT WITH OR WITHOUT PRESENCE OF ULCERS  *URINARY PROBLEMS  *BOWEL PROBLEMS  UNUSUAL RASH Items with * indicate a potential emergency and should be followed up as soon as possible.  Feel free to call the clinic should you have any questions or concerns. The clinic phone number is [phone].  Please show the [place] at check-in to the Emergency Department and triage nurse.  Trastuzumab injection for infusion What is this medicine? TRASTUZUMAB (tras TOO zoo mab) is a monoclonal antibody. It is used to treat breast cancer and stomach cancer. This medicine may be used for other purposes; ask your health care provider or pharmacist if you have questions. COMMON BRAND NAME(S): Herceptin, Calla Kicks, OGIVRI What should I tell my health care provider before I take this medicine? They need to know if you have any of these conditions: -heart disease -heart failure -lung or breathing disease, like asthma -an unusual or allergic reaction to trastuzumab, benzyl alcohol, or other medications, foods, dyes, or preservatives -pregnant or trying to get pregnant -breast-feeding How should I use this medicine? This drug is given as an infusion into a vein. It is administered in a hospital  or clinic by a specially trained health care professional. Talk to your pediatrician regarding the use of this medicine in children. This medicine is not approved for use in children. Overdosage: If you think you have taken too much of this medicine contact a poison control center or emergency room at once. NOTE: This medicine is only for you. Do not share this medicine with others. What if I miss a dose? It is  important not to miss a dose. Call your doctor or health care professional if you are unable to keep an appointment. What may interact with this medicine? This medicine may interact with the following medications: -certain types of chemotherapy, such as daunorubicin, doxorubicin, epirubicin, and idarubicin This list may not describe all possible interactions. Give your health care provider a list of all the medicines, herbs, non-prescription drugs, or dietary supplements you use. Also tell them if you smoke, drink alcohol, or use illegal drugs. Some items may interact with your medicine. What should I watch for while using this medicine? Visit your doctor for checks on your progress. Report any side effects. Continue your course of treatment even though you feel ill unless your doctor tells you to stop. Call your doctor or health care professional for advice if you get a fever, chills or sore throat, or other symptoms of a cold or flu. Do not treat yourself. Try to avoid being around people who are sick. You may experience fever, chills and shaking during your first infusion. These effects are usually mild and can be treated with other medicines. Report any side effects during the infusion to your health care professional. Fever and chills usually do not happen with later infusions. Do not become pregnant while taking this medicine or for 7 months after stopping it. Women should inform their doctor if they wish to become pregnant or think they might be pregnant. Women of child-bearing potential will need to have a negative pregnancy test before starting this medicine. There is a potential for serious side effects to an unborn child. Talk to your health care professional or pharmacist for more information. Do not breast-feed an infant while taking this medicine or for 7 months after stopping it. Women must use effective birth control with this medicine. What side effects may I notice from receiving this  medicine? Side effects that you should report to your doctor or health care professional as soon as possible: -allergic reactions like skin rash, itching or hives, swelling of the face, lips, or tongue -chest pain or palpitations -cough -dizziness -feeling faint or lightheaded, falls -fever -general ill feeling or flu-like symptoms -signs of worsening heart failure like breathing problems; swelling in your legs and feet -unusually weak or tired Side effects that usually do not require medical attention (report to your doctor or health care professional if they continue or are bothersome): -bone pain -changes in taste -diarrhea -joint pain -nausea/vomiting -weight loss This list may not describe all possible side effects. Call your doctor for medical advice about side effects. You may report side effects to FDA at 1-800-FDA-1088. Where should I keep my medicine? This drug is given in a hospital or clinic and will not be stored at home. NOTE: This sheet is a summary. It may not cover all possible information. If you have questions about this medicine, talk to your doctor, pharmacist, or health care provider.  2019 Elsevier/Gold Standard (2016-10-31 14:37:52)  [facility] Discharge Instructions for Patients Receiving Chemotherapy  Today you received the following chemotherapy  agents Herceptin.  To help prevent nausea and vomiting after your treatment, we encourage you to take your nausea medication as prescribed.   If you develop nausea and vomiting that is not controlled by your nausea medication, call the clinic.   BELOW ARE SYMPTOMS THAT SHOULD BE REPORTED IMMEDIATELY:  *FEVER GREATER THAN 100.5 F  *CHILLS WITH OR WITHOUT FEVER  NAUSEA AND VOMITING THAT IS NOT CONTROLLED WITH YOUR NAUSEA MEDICATION  *UNUSUAL SHORTNESS OF BREATH  *UNUSUAL BRUISING OR BLEEDING  TENDERNESS IN MOUTH AND THROAT WITH OR WITHOUT PRESENCE OF ULCERS  *URINARY PROBLEMS  *BOWEL  PROBLEMS  UNUSUAL RASH Items with * indicate a potential emergency and should be followed up as soon as possible.  Feel free to call the clinic should you have any questions or concerns. The clinic phone number is [phone].  Please show the [place] at check-in to the Emergency Department and triage nurse.  Trastuzumab injection for infusion What is this medicine? TRASTUZUMAB (tras TOO zoo mab) is a monoclonal antibody. It is used to treat breast cancer and stomach cancer. This medicine may be used for other purposes; ask your health care provider or pharmacist if you have questions. COMMON BRAND NAME(S): Herceptin, Calla Kicks, OGIVRI What should I tell my health care provider before I take this medicine? They need to know if you have any of these conditions: -heart disease -heart failure -lung or breathing disease, like asthma -an unusual or allergic reaction to trastuzumab, benzyl alcohol, or other medications, foods, dyes, or preservatives -pregnant or trying to get pregnant -breast-feeding How should I use this medicine? This drug is given as an infusion into a vein. It is administered in a hospital or clinic by a specially trained health care professional. Talk to your pediatrician regarding the use of this medicine in children. This medicine is not approved for use in children. Overdosage: If you think you have taken too much of this medicine contact a poison control center or emergency room at once. NOTE: This medicine is only for you. Do not share this medicine with others. What if I miss a dose? It is important not to miss a dose. Call your doctor or health care professional if you are unable to keep an appointment. What may interact with this medicine? This medicine may interact with the following medications: -certain types of chemotherapy, such as daunorubicin, doxorubicin, epirubicin, and idarubicin This list may not describe all possible interactions. Give  your health care provider a list of all the medicines, herbs, non-prescription drugs, or dietary supplements you use. Also tell them if you smoke, drink alcohol, or use illegal drugs. Some items may interact with your medicine. What should I watch for while using this medicine? Visit your doctor for checks on your progress. Report any side effects. Continue your course of treatment even though you feel ill unless your doctor tells you to stop. Call your doctor or health care professional for advice if you get a fever, chills or sore throat, or other symptoms of a cold or flu. Do not treat yourself. Try to avoid being around people who are sick. You may experience fever, chills and shaking during your first infusion. These effects are usually mild and can be treated with other medicines. Report any side effects during the infusion to your health care professional. Fever and chills usually do not happen with later infusions. Do not become pregnant while taking this medicine or for 7 months after stopping it. Women should inform their  doctor if they wish to become pregnant or think they might be pregnant. Women of child-bearing potential will need to have a negative pregnancy test before starting this medicine. There is a potential for serious side effects to an unborn child. Talk to your health care professional or pharmacist for more information. Do not breast-feed an infant while taking this medicine or for 7 months after stopping it. Women must use effective birth control with this medicine. What side effects may I notice from receiving this medicine? Side effects that you should report to your doctor or health care professional as soon as possible: -allergic reactions like skin rash, itching or hives, swelling of the face, lips, or tongue -chest pain or palpitations -cough -dizziness -feeling faint or lightheaded, falls -fever -general ill feeling or flu-like symptoms -signs of worsening heart  failure like breathing problems; swelling in your legs and feet -unusually weak or tired Side effects that usually do not require medical attention (report to your doctor or health care professional if they continue or are bothersome): -bone pain -changes in taste -diarrhea -joint pain -nausea/vomiting -weight loss This list may not describe all possible side effects. Call your doctor for medical advice about side effects. You may report side effects to FDA at 1-800-FDA-1088. Where should I keep my medicine? This drug is given in a hospital or clinic and will not be stored at home. NOTE: This sheet is a summary. It may not cover all possible information. If you have questions about this medicine, talk to your doctor, pharmacist, or health care provider.  2019 Elsevier/Gold Standard (2016-10-31 14:37:52)

## 2019-07-24 NOTE — Progress Notes (Signed)
I called the patient today about her upcoming follow-up appointment in radiation oncology.   Given concerns about the COVID-19 pandemic, I offered a phone assessment with the patient to determine if coming to the clinic was necessary. She accepted.  I let the patient know that I had spoken with Dr. Isidore Moos, and she wanted them to know the importance of washing their hands for at least 20 seconds at a time, especially after going out in public, and before they eat.  Limit going out in public whenever possible. Do not touch your face, unless your hands are clean, such as when bathing. Get plenty of rest, eat well, and stay hydrated.   The patient denies any symptomatic concerns.  Specifically, they report good healing of their skin in the radiation fields.  Skin is intact.    I recommended that she continue skin care by applying oil or lotion with vitamin E to the skin in the radiation fields, BID, for 2 more months.  Continue follow-up with medical oncology - follow-up is scheduled on 08/01/19 with Dr. Lindi Adie and infusion.  I explained that yearly mammograms are important for patients with intact breast tissue, and physical exams are important after mastectomy for patients that cannot undergo mammography.  I encouraged her to call if she had further questions or concerns about her healing. Otherwise, she will follow-up PRN in radiation oncology. Patient is pleased with this plan, and we will cancel her upcoming follow-up to reduce the risk of COVID-19 transmission.

## 2019-07-25 ENCOUNTER — Inpatient Hospital Stay (HOSPITAL_COMMUNITY)
Admission: EM | Admit: 2019-07-25 | Discharge: 2019-08-06 | DRG: 480 | Disposition: A | Discharge status: Skilled Nursing Facility | Payer: Medicare Other | Attending: Family Medicine | Admitting: Family Medicine

## 2019-07-25 ENCOUNTER — Emergency Department (HOSPITAL_COMMUNITY): Payer: Medicare Other

## 2019-07-25 ENCOUNTER — Other Ambulatory Visit: Payer: Self-pay

## 2019-07-25 ENCOUNTER — Encounter (HOSPITAL_COMMUNITY): Payer: Self-pay

## 2019-07-25 ENCOUNTER — Ambulatory Visit
Admission: RE | Admit: 2019-07-25 | Discharge: 2019-07-25 | Disposition: A | Discharge status: Home / Self Care | Payer: Medicare Other | Source: Ambulatory Visit | Attending: Radiation Oncology | Admitting: Radiation Oncology

## 2019-07-25 DIAGNOSIS — Z7901 Long term (current) use of anticoagulants: Secondary | ICD-10-CM

## 2019-07-25 DIAGNOSIS — S72391A Other fracture of shaft of right femur, initial encounter for closed fracture: Secondary | ICD-10-CM | POA: Diagnosis not present

## 2019-07-25 DIAGNOSIS — S79929A Unspecified injury of unspecified thigh, initial encounter: Secondary | ICD-10-CM | POA: Diagnosis not present

## 2019-07-25 DIAGNOSIS — F418 Other specified anxiety disorders: Secondary | ICD-10-CM | POA: Diagnosis present

## 2019-07-25 DIAGNOSIS — K7689 Other specified diseases of liver: Secondary | ICD-10-CM | POA: Diagnosis not present

## 2019-07-25 DIAGNOSIS — Z853 Personal history of malignant neoplasm of breast: Secondary | ICD-10-CM

## 2019-07-25 DIAGNOSIS — M9711XA Periprosthetic fracture around internal prosthetic right knee joint, initial encounter: Principal | ICD-10-CM | POA: Diagnosis present

## 2019-07-25 DIAGNOSIS — D72825 Bandemia: Secondary | ICD-10-CM | POA: Diagnosis not present

## 2019-07-25 DIAGNOSIS — F17200 Nicotine dependence, unspecified, uncomplicated: Secondary | ICD-10-CM

## 2019-07-25 DIAGNOSIS — G4733 Obstructive sleep apnea (adult) (pediatric): Secondary | ICD-10-CM | POA: Diagnosis present

## 2019-07-25 DIAGNOSIS — D62 Acute posthemorrhagic anemia: Secondary | ICD-10-CM | POA: Diagnosis not present

## 2019-07-25 DIAGNOSIS — Z79899 Other long term (current) drug therapy: Secondary | ICD-10-CM

## 2019-07-25 DIAGNOSIS — F05 Delirium due to known physiological condition: Secondary | ICD-10-CM | POA: Diagnosis present

## 2019-07-25 DIAGNOSIS — I951 Orthostatic hypotension: Secondary | ICD-10-CM | POA: Diagnosis present

## 2019-07-25 DIAGNOSIS — Z96653 Presence of artificial knee joint, bilateral: Secondary | ICD-10-CM | POA: Diagnosis present

## 2019-07-25 DIAGNOSIS — G8929 Other chronic pain: Secondary | ICD-10-CM | POA: Diagnosis present

## 2019-07-25 DIAGNOSIS — Z66 Do not resuscitate: Secondary | ICD-10-CM | POA: Diagnosis not present

## 2019-07-25 DIAGNOSIS — E860 Dehydration: Secondary | ICD-10-CM | POA: Diagnosis present

## 2019-07-25 DIAGNOSIS — S7291XA Unspecified fracture of right femur, initial encounter for closed fracture: Secondary | ICD-10-CM | POA: Diagnosis not present

## 2019-07-25 DIAGNOSIS — F331 Major depressive disorder, recurrent, moderate: Secondary | ICD-10-CM | POA: Diagnosis not present

## 2019-07-25 DIAGNOSIS — R509 Fever, unspecified: Secondary | ICD-10-CM

## 2019-07-25 DIAGNOSIS — F329 Major depressive disorder, single episode, unspecified: Secondary | ICD-10-CM | POA: Diagnosis present

## 2019-07-25 DIAGNOSIS — G9340 Encephalopathy, unspecified: Secondary | ICD-10-CM | POA: Diagnosis not present

## 2019-07-25 DIAGNOSIS — R2689 Other abnormalities of gait and mobility: Secondary | ICD-10-CM | POA: Diagnosis not present

## 2019-07-25 DIAGNOSIS — D72829 Elevated white blood cell count, unspecified: Secondary | ICD-10-CM | POA: Diagnosis not present

## 2019-07-25 DIAGNOSIS — I10 Essential (primary) hypertension: Secondary | ICD-10-CM | POA: Diagnosis not present

## 2019-07-25 DIAGNOSIS — E039 Hypothyroidism, unspecified: Secondary | ICD-10-CM | POA: Diagnosis present

## 2019-07-25 DIAGNOSIS — Z8249 Family history of ischemic heart disease and other diseases of the circulatory system: Secondary | ICD-10-CM | POA: Diagnosis not present

## 2019-07-25 DIAGNOSIS — S72491D Other fracture of lower end of right femur, subsequent encounter for closed fracture with routine healing: Secondary | ICD-10-CM | POA: Diagnosis not present

## 2019-07-25 DIAGNOSIS — C50912 Malignant neoplasm of unspecified site of left female breast: Secondary | ICD-10-CM | POA: Diagnosis present

## 2019-07-25 DIAGNOSIS — Z20828 Contact with and (suspected) exposure to other viral communicable diseases: Secondary | ICD-10-CM | POA: Diagnosis present

## 2019-07-25 DIAGNOSIS — R7303 Prediabetes: Secondary | ICD-10-CM

## 2019-07-25 DIAGNOSIS — Z9841 Cataract extraction status, right eye: Secondary | ICD-10-CM

## 2019-07-25 DIAGNOSIS — S0990XA Unspecified injury of head, initial encounter: Secondary | ICD-10-CM | POA: Diagnosis not present

## 2019-07-25 DIAGNOSIS — Y92009 Unspecified place in unspecified non-institutional (private) residence as the place of occurrence of the external cause: Secondary | ICD-10-CM | POA: Diagnosis not present

## 2019-07-25 DIAGNOSIS — Z820 Family history of epilepsy and other diseases of the nervous system: Secondary | ICD-10-CM | POA: Diagnosis not present

## 2019-07-25 DIAGNOSIS — S72341A Displaced spiral fracture of shaft of right femur, initial encounter for closed fracture: Secondary | ICD-10-CM

## 2019-07-25 DIAGNOSIS — Z923 Personal history of irradiation: Secondary | ICD-10-CM

## 2019-07-25 DIAGNOSIS — Z8261 Family history of arthritis: Secondary | ICD-10-CM

## 2019-07-25 DIAGNOSIS — R52 Pain, unspecified: Secondary | ICD-10-CM | POA: Diagnosis not present

## 2019-07-25 DIAGNOSIS — E1165 Type 2 diabetes mellitus with hyperglycemia: Secondary | ICD-10-CM | POA: Diagnosis present

## 2019-07-25 DIAGNOSIS — W19XXXA Unspecified fall, initial encounter: Secondary | ICD-10-CM

## 2019-07-25 DIAGNOSIS — F419 Anxiety disorder, unspecified: Secondary | ICD-10-CM | POA: Diagnosis present

## 2019-07-25 DIAGNOSIS — R2681 Unsteadiness on feet: Secondary | ICD-10-CM | POA: Diagnosis not present

## 2019-07-25 DIAGNOSIS — C50412 Malignant neoplasm of upper-outer quadrant of left female breast: Secondary | ICD-10-CM | POA: Diagnosis not present

## 2019-07-25 DIAGNOSIS — I491 Atrial premature depolarization: Secondary | ICD-10-CM | POA: Diagnosis not present

## 2019-07-25 DIAGNOSIS — E1169 Type 2 diabetes mellitus with other specified complication: Secondary | ICD-10-CM | POA: Diagnosis not present

## 2019-07-25 DIAGNOSIS — Z03818 Encounter for observation for suspected exposure to other biological agents ruled out: Secondary | ICD-10-CM | POA: Diagnosis not present

## 2019-07-25 DIAGNOSIS — Z79891 Long term (current) use of opiate analgesic: Secondary | ICD-10-CM

## 2019-07-25 DIAGNOSIS — D500 Iron deficiency anemia secondary to blood loss (chronic): Secondary | ICD-10-CM | POA: Diagnosis not present

## 2019-07-25 DIAGNOSIS — F17210 Nicotine dependence, cigarettes, uncomplicated: Secondary | ICD-10-CM | POA: Diagnosis present

## 2019-07-25 DIAGNOSIS — S72401A Unspecified fracture of lower end of right femur, initial encounter for closed fracture: Secondary | ICD-10-CM | POA: Diagnosis not present

## 2019-07-25 DIAGNOSIS — Z7401 Bed confinement status: Secondary | ICD-10-CM | POA: Diagnosis not present

## 2019-07-25 DIAGNOSIS — R1030 Lower abdominal pain, unspecified: Secondary | ICD-10-CM | POA: Diagnosis not present

## 2019-07-25 DIAGNOSIS — M2550 Pain in unspecified joint: Secondary | ICD-10-CM | POA: Diagnosis not present

## 2019-07-25 DIAGNOSIS — S7291XD Unspecified fracture of right femur, subsequent encounter for closed fracture with routine healing: Secondary | ICD-10-CM | POA: Diagnosis not present

## 2019-07-25 DIAGNOSIS — S72341D Displaced spiral fracture of shaft of right femur, subsequent encounter for closed fracture with routine healing: Secondary | ICD-10-CM | POA: Diagnosis not present

## 2019-07-25 DIAGNOSIS — I4891 Unspecified atrial fibrillation: Secondary | ICD-10-CM | POA: Diagnosis not present

## 2019-07-25 DIAGNOSIS — R739 Hyperglycemia, unspecified: Secondary | ICD-10-CM

## 2019-07-25 DIAGNOSIS — G9341 Metabolic encephalopathy: Secondary | ICD-10-CM | POA: Diagnosis not present

## 2019-07-25 DIAGNOSIS — N390 Urinary tract infection, site not specified: Secondary | ICD-10-CM | POA: Diagnosis not present

## 2019-07-25 DIAGNOSIS — R410 Disorientation, unspecified: Secondary | ICD-10-CM | POA: Diagnosis not present

## 2019-07-25 DIAGNOSIS — Z4789 Encounter for other orthopedic aftercare: Secondary | ICD-10-CM | POA: Diagnosis not present

## 2019-07-25 DIAGNOSIS — Z96651 Presence of right artificial knee joint: Secondary | ICD-10-CM | POA: Diagnosis not present

## 2019-07-25 DIAGNOSIS — I480 Paroxysmal atrial fibrillation: Secondary | ICD-10-CM | POA: Diagnosis present

## 2019-07-25 DIAGNOSIS — Z419 Encounter for procedure for purposes other than remedying health state, unspecified: Secondary | ICD-10-CM

## 2019-07-25 DIAGNOSIS — R0902 Hypoxemia: Secondary | ICD-10-CM | POA: Diagnosis not present

## 2019-07-25 DIAGNOSIS — I959 Hypotension, unspecified: Secondary | ICD-10-CM | POA: Diagnosis not present

## 2019-07-25 DIAGNOSIS — F32A Depression, unspecified: Secondary | ICD-10-CM

## 2019-07-25 DIAGNOSIS — S72451A Displaced supracondylar fracture without intracondylar extension of lower end of right femur, initial encounter for closed fracture: Secondary | ICD-10-CM

## 2019-07-25 DIAGNOSIS — S79911A Unspecified injury of right hip, initial encounter: Secondary | ICD-10-CM | POA: Diagnosis not present

## 2019-07-25 DIAGNOSIS — Z9842 Cataract extraction status, left eye: Secondary | ICD-10-CM

## 2019-07-25 DIAGNOSIS — E119 Type 2 diabetes mellitus without complications: Secondary | ICD-10-CM | POA: Diagnosis not present

## 2019-07-25 DIAGNOSIS — R278 Other lack of coordination: Secondary | ICD-10-CM | POA: Diagnosis not present

## 2019-07-25 DIAGNOSIS — Z79890 Hormone replacement therapy (postmenopausal): Secondary | ICD-10-CM

## 2019-07-25 LAB — CBC WITH DIFFERENTIAL/PLATELET
Abs Immature Granulocytes: 0.03 10*3/uL (ref 0.00–0.07)
Basophils Absolute: 0 10*3/uL (ref 0.0–0.1)
Basophils Relative: 0 %
Eosinophils Absolute: 0.1 10*3/uL (ref 0.0–0.5)
Eosinophils Relative: 1 %
HCT: 37.2 % (ref 36.0–46.0)
Hemoglobin: 11.9 g/dL — ABNORMAL LOW (ref 12.0–15.0)
Immature Granulocytes: 0 %
Lymphocytes Relative: 5 %
Lymphs Abs: 0.7 10*3/uL (ref 0.7–4.0)
MCH: 29.2 pg (ref 26.0–34.0)
MCHC: 32 g/dL (ref 30.0–36.0)
MCV: 91.2 fL (ref 80.0–100.0)
Monocytes Absolute: 0.6 10*3/uL (ref 0.1–1.0)
Monocytes Relative: 5 %
Neutro Abs: 10.8 10*3/uL — ABNORMAL HIGH (ref 1.7–7.7)
Neutrophils Relative %: 89 %
Platelets: 197 10*3/uL (ref 150–400)
RBC: 4.08 MIL/uL (ref 3.87–5.11)
RDW: 15 % (ref 11.5–15.5)
WBC: 12.2 10*3/uL — ABNORMAL HIGH (ref 4.0–10.5)
nRBC: 0 % (ref 0.0–0.2)

## 2019-07-25 LAB — COMPREHENSIVE METABOLIC PANEL
ALT: 13 U/L (ref 0–44)
AST: 17 U/L (ref 15–41)
Albumin: 3.4 g/dL — ABNORMAL LOW (ref 3.5–5.0)
Alkaline Phosphatase: 58 U/L (ref 38–126)
Anion gap: 9 (ref 5–15)
BUN: 26 mg/dL — ABNORMAL HIGH (ref 8–23)
CO2: 27 mmol/L (ref 22–32)
Calcium: 9.3 mg/dL (ref 8.9–10.3)
Chloride: 102 mmol/L (ref 98–111)
Creatinine, Ser: 0.79 mg/dL (ref 0.44–1.00)
GFR calc Af Amer: >60 mL/min (ref >60)
GFR calc non Af Amer: >60 mL/min (ref >60)
Glucose, Bld: 144 mg/dL — ABNORMAL HIGH (ref 70–99)
Potassium: 4 mmol/L (ref 3.5–5.1)
Sodium: 138 mmol/L (ref 135–145)
Total Bilirubin: 0.6 mg/dL (ref 0.3–1.2)
Total Protein: 6.2 g/dL — ABNORMAL LOW (ref 6.5–8.1)

## 2019-07-25 LAB — PROTIME-INR
INR: 1.2 (ref 0.8–1.2)
Prothrombin Time: 14.9 seconds (ref 11.4–15.2)

## 2019-07-25 LAB — SURGICAL PCR SCREEN
MRSA, PCR: NEGATIVE
Staphylococcus aureus: NEGATIVE

## 2019-07-25 LAB — GLUCOSE, CAPILLARY: Glucose-Capillary: 176 mg/dL — ABNORMAL HIGH (ref 70–99)

## 2019-07-25 LAB — SARS CORONAVIRUS 2 BY RT PCR (HOSPITAL ORDER, PERFORMED IN ~~LOC~~ HOSPITAL LAB): SARS Coronavirus 2: NEGATIVE

## 2019-07-25 LAB — APTT: aPTT: 27 seconds (ref 24–36)

## 2019-07-25 LAB — CBG MONITORING, ED: Glucose-Capillary: 139 mg/dL — ABNORMAL HIGH (ref 70–99)

## 2019-07-25 MED ORDER — ZOLPIDEM TARTRATE 5 MG PO TABS
5.0000 mg | ORAL_TABLET | Freq: Every evening | ORAL | Status: DC | PRN
  Administered 2019-07-25: 5 mg via ORAL
  Filled 2019-07-25: qty 1

## 2019-07-25 MED ORDER — CHLORHEXIDINE GLUCONATE 4 % EX LIQD
60.0000 mL | Freq: Once | CUTANEOUS | Status: AC
  Administered 2019-07-26: 4 via TOPICAL
  Filled 2019-07-25: qty 60

## 2019-07-25 MED ORDER — FLEET ENEMA 7-19 GM/118ML RE ENEM: 1.0000 | ENEMA | Freq: Once | RECTAL | Status: DC | PRN

## 2019-07-25 MED ORDER — BISACODYL 5 MG PO TBEC: 5.0000 mg | DELAYED_RELEASE_TABLET | Freq: Every day | ORAL | Status: DC | PRN

## 2019-07-25 MED ORDER — DOCUSATE SODIUM 100 MG PO CAPS
100.0000 mg | ORAL_CAPSULE | Freq: Two times a day (BID) | ORAL | Status: DC
  Administered 2019-07-25 (×2): 100 mg via ORAL
  Filled 2019-07-25 (×2): qty 1

## 2019-07-25 MED ORDER — ONDANSETRON HCL 4 MG/2ML IJ SOLN
4.0000 mg | Freq: Four times a day (QID) | INTRAMUSCULAR | Status: DC | PRN
  Administered 2019-07-25 – 2019-07-26 (×2): 4 mg via INTRAVENOUS
  Filled 2019-07-25 (×3): qty 2

## 2019-07-25 MED ORDER — ACETAMINOPHEN 650 MG RE SUPP: 650.0000 mg | Freq: Four times a day (QID) | RECTAL | Status: DC | PRN

## 2019-07-25 MED ORDER — POVIDONE-IODINE 10 % EX SWAB
2.0000 "application " | Freq: Once | CUTANEOUS | Status: AC
  Administered 2019-07-26: 2 via TOPICAL

## 2019-07-25 MED ORDER — SENNOSIDES-DOCUSATE SODIUM 8.6-50 MG PO TABS
1.0000 | ORAL_TABLET | Freq: Every evening | ORAL | Status: DC | PRN
  Administered 2019-07-25: 1 via ORAL
  Filled 2019-07-25: qty 1

## 2019-07-25 MED ORDER — OXYCODONE HCL 5 MG PO TABS
5.0000 mg | ORAL_TABLET | ORAL | Status: DC | PRN
  Administered 2019-07-25 (×2): 5 mg via ORAL
  Filled 2019-07-25 (×2): qty 1

## 2019-07-25 MED ORDER — FENTANYL CITRATE (PF) 100 MCG/2ML IJ SOLN
50.0000 ug | Freq: Once | INTRAMUSCULAR | Status: AC
  Administered 2019-07-25: 09:00:00 50 ug via INTRAVENOUS
  Filled 2019-07-25: qty 2

## 2019-07-25 MED ORDER — MORPHINE SULFATE (PF) 2 MG/ML IV SOLN
2.0000 mg | INTRAVENOUS | Status: DC | PRN
  Administered 2019-07-25 (×2): 2 mg via INTRAVENOUS
  Filled 2019-07-25 (×2): qty 1

## 2019-07-25 MED ORDER — ENSURE PRE-SURGERY PO LIQD
296.0000 mL | Freq: Once | ORAL | Status: DC
  Filled 2019-07-25: qty 296

## 2019-07-25 MED ORDER — INSULIN ASPART 100 UNIT/ML ~~LOC~~ SOLN
0.0000 [IU] | Freq: Three times a day (TID) | SUBCUTANEOUS | Status: DC
  Administered 2019-07-25: 3 [IU] via SUBCUTANEOUS
  Administered 2019-07-26: 2 [IU] via SUBCUTANEOUS
  Administered 2019-07-27 – 2019-08-06 (×12): 1 [IU] via SUBCUTANEOUS
  Filled 2019-07-25: qty 0.09

## 2019-07-25 MED ORDER — ACETAMINOPHEN 325 MG PO TABS: 650.0000 mg | ORAL_TABLET | Freq: Four times a day (QID) | ORAL | Status: DC | PRN

## 2019-07-25 MED ORDER — POTASSIUM CHLORIDE IN NACL 20-0.9 MEQ/L-% IV SOLN
INTRAVENOUS | Status: AC
  Administered 2019-07-25 (×2): via INTRAVENOUS
  Filled 2019-07-25: qty 1000

## 2019-07-25 MED ORDER — INSULIN ASPART 100 UNIT/ML ~~LOC~~ SOLN: 0.0000 [IU] | Freq: Every day | SUBCUTANEOUS | Status: DC

## 2019-07-25 MED ORDER — NICOTINE 7 MG/24HR TD PT24
7.0000 mg | MEDICATED_PATCH | Freq: Every day | TRANSDERMAL | Status: DC
  Administered 2019-07-31 – 2019-08-04 (×4): 7 mg via TRANSDERMAL
  Filled 2019-07-25 (×13): qty 1

## 2019-07-25 NOTE — ED Notes (Signed)
Attempted report x1, no answer on receiving unit.  

## 2019-07-25 NOTE — ED Triage Notes (Signed)
Per ems: Pt coming from home c/o right knee pain after falling on it this morning. Hx of knee replacement 15-20 years ago. Pain with any movement. Possible upward right shift.    100 mcg fentanyl given by ems  20 G R AC  184/65 75 HR 94 2 L  14 RR 97.2 temp

## 2019-07-25 NOTE — Progress Notes (Signed)
Pt. Is having nausea and vomiting issues at this time. Will hold off on wearing cpap at this time. Will reassess. RN is aware.

## 2019-07-25 NOTE — H&P (View-Only) (Signed)
ORTHOPAEDIC CONSULTATION  REQUESTING PHYSICIAN: Julianne Rice, MD  PCP:  Gayland Curry, DO  Chief Complaint: Right periprosthetic distal femur fracture  HPI: Andrea [name] is a 84 y.o. female with a history of atrial fibrillation on Eliquis (last dose was 7 PM yesterday), urinary incontinence, depression/anxiety, chronic low back pain with radicular leg pain, frequent falls, sleep apnea, and breast cancer status post surgery and XRT currently receiving chemotherapy IV infusions every 3 weeks (next scheduled on 08/01/2019) who fell while going to the bathroom last night.  She had immediate right thigh pain and deformity.  She was unable to bear weight.  She was brought to the emergency department at [facility], where radiographs revealed a long, spiral periprosthetic right distal femur fracture without loosening of the implant.  A knee immobilizer was placed.  Orthopedic consultation was placed.  I was asked to assume care of the patient by my partner, Dr. Doran Durand.  Past Medical History:  Diagnosis Date   A-fib ([place])    Anemia    Anxiety    Arthritis    hands, spine    Bladder incontinence    Blood transfusion    Cancer ([place]) 02/2019   left breast cancer   Cervicalgia    Constipation    Cough    [place]-onset 08/2009, as of 2014- resolved    Deep venous thrombosis ([place])    post op, rec'd/needed  blood thinner    Depression    Depression with anxiety    Dysrhythmia    a-fib   Falls frequently    pt. reports that she was falling 3 times a day, has had PT at a facility for a while & now is getting home PT 2-3 times/ week    Headache(784.0) 09/27/2011   History of stress test    15-20 yrs.ago    Hypotension    Hypothyroidism    Lumbar radiculopathy 09/26/2011   Pneumonia    never been in hosp. for pneumonia    Prediabetes    Right hand fracture    Monday   Shortness of breath    Sleep apnea    does not use consistently   Past  Surgical History:  Procedure Laterality Date   ANTERIOR CERVICAL DECOMP/DISCECTOMY FUSION N/A 07/09/2013   Procedure: ANTERIOR CERVICAL DECOMPRESSION/DISCECTOMY FUSION 3 LEVELS;  Surgeon: Sinclair Ship, MD;  Location: [place];  Service: Orthopedics;  Laterality: N/A;  Anterior cervical decompression fusion, cervical 3-4, cervical 4-5-, cervical 5-6 with instrumentation, allograft.   APPENDECTOMY     BACK SURGERY     2000   BREAST EXCISIONAL BIOPSY     breast bxs on left x 2, breast bx of right x 1-all benign   EYE SURGERY     cataracts bilateral /w IOL   HERNIA REPAIR     umbilical hernia- [id number]   IR IMAGING GUIDED PORT INSERTION  04/28/2019   JOINT REPLACEMENT  2006,2007   bilateral   LAPAROSCOPIC OVARIAN CYSTECTOMY     not done by laparscopy-abdominal incision   PORTACATH PLACEMENT Right 04/01/2019   Procedure: ATTEMPTED INSERTION PORT-A-CATH WITH ULTRASOUND;  Surgeon: [name], MD;  Location: [place];  Service: General;  Laterality: Right;   RADIOACTIVE SEED GUIDED PARTIAL MASTECTOMY WITH AXILLARY SENTINEL LYMPH NODE BIOPSY Left 04/01/2019   Procedure: LEFT BREAST PARTIAL MASTECTOMY WITH RADIOACTIVE SEED AND LEFT SENTINEL LYMPH NODE BIOPSY;  Surgeon: [name], MD;  Location: [place];  Service: General;  Laterality:  Left;   RE-EXCISION OF BREAST CANCER,SUPERIOR MARGINS Left 04/15/2019   Procedure: RE-EXCISION OF LEFT BREAST CANCER POSITIVE MARGINS;  Surgeon: [name], MD;  Location: [place];  Service: General;  Laterality: Left;   REPLACEMENT TOTAL KNEE BILATERAL     TONSILLECTOMY     Social History   Socioeconomic History   Marital status: Single    Spouse name: Not on file   Number of children: 3   Years of education: College   Highest education level: Not on file  Occupational History   Occupation: Retired Designer, multimedia strain: Not very hard   Food  insecurity    Worry: Never true    Inability: Never true   Transportation needs    Medical: No    Non-medical: No  Tobacco Use   Smoking status: Current Every Day Smoker    Packs/day: 0.50    Years: 20.00    Pack years: 10.00    Types: Cigarettes   Smokeless tobacco: Never Used   Tobacco comment: Started back a few months ago as of 02/20/2019. 5-6 cig's daily  Substance and Sexual Activity   Alcohol use: Yes    Comment: rare   Drug use: No   Sexual activity: Never  Lifestyle   Physical activity    Days per week: 0 days    Minutes per session: 0 min   Stress: Only a little  Relationships   Social connections    Talks on phone: More than three times a week    Gets together: More than three times a week    Attends religious service: More than 4 times per year    Active member of club or organization: Yes    Attends meetings of clubs or organizations: Never    Relationship status: Divorced  Other Topics Concern   Not on file  Social History Narrative   Patient is single and lives alone.   Patient is retired.   Patient has a college education.   Patient has three adult children   Patient is right-handed.   Patient drinks 3-4 cups of caffeine (soda and coffee) daily.   Walks with walker   Family History  Problem Relation Age of Onset   Heart disease Father    Malignant hyperthermia Father    Arthritis Father    Deep vein thrombosis Son    Diabetes Son    Obesity Son    Depression Son    Hypertension Son    Parkinson's disease Mother    Diabetes Daughter    Hypertension Daughter    Anxiety disorder Daughter    Hyperlipidemia Son    Hypertension Son    Cancer Maternal Aunt    Allergies  Allergen Reactions   Clindamycin Hcl Other (See Comments)    REACTION: swelling, pt. Reports that she had a 16 lb. weightgain in one day   Prior to Admission medications   Medication Sig Start Date End Date Taking? Authorizing Provider  anastrozole  (ARIMIDEX) 1 MG tablet Take 1 tablet (1 mg total) by mouth daily. 03/12/19   Nicholas Lose, MD  Brexpiprazole 1 MG TABS Take 1 mg by mouth at bedtime.     [provider]  busPIRone (BUSPAR) 5 MG tablet Take 5-10 mg by mouth See admin instructions. 10 MG IN THE MORNING, 5 MG AT DINNER    [provider]  CALCIUM CITRATE PO Take 600 mg by mouth 2 (two) times daily.  [provider]  clonazePAM (KLONOPIN) 0.5 MG tablet Take 1 tablet by mouth as needed in middle of the nite to go back to sleep 12/07/17   Reed, Tiffany L, DO  DULoxetine (CYMBALTA) 60 MG capsule Take 2 capsules (120 mg total) by mouth daily. 06/17/15   Reed, Tiffany L, DO  ELIQUIS 5 MG TABS tablet TAKE 1 TABLET BY MOUTH TWICE A DAY 06/27/19   Reed, Tiffany L, DO  ferrous sulfate (FERROUSUL) 325 (65 FE) MG tablet Take 325 mg by mouth daily with breakfast.    [provider]  furosemide (LASIX) 40 MG tablet TAKE 1/2 TABLET BY MOUTH EVERY DAY 04/28/19   Reed, Tiffany L, DO  levothyroxine (SYNTHROID) 50 MCG tablet TAKE ONE TABLET BY MOUTH ONCE DAILY 30 MINUTES BEFORE BREAKFAST FOR THYROID 06/17/19   Reed, Tiffany L, DO  mirtazapine (REMERON) 15 MG tablet Take 7.5 mg by mouth at bedtime. 05/12/15   [provider]  Multiple Vitamins-Minerals (MULTIVITAMIN PO) Take 1 tablet by mouth daily.     [provider]  oxybutynin (DITROPAN-XL) 10 MG 24 hr tablet Take 10 mg by mouth daily. 07/08/19   [provider]  oxyCODONE-acetaminophen (PERCOCET) 10-325 MG tablet Take 1 tab every 4 hrs as needed for moderate pain and 2 tabs every 4 hrs as needed for severe pain 07/11/19   Reed, Tiffany L, DO  solifenacin (VESICARE) 10 MG tablet Take 1 tablet by mouth daily. 06/30/19   [provider]  traZODone (DESYREL) 100 MG tablet Take 200 mg by mouth at bedtime. Take 2 tablets by mouth at bedtime    [provider]  vitamin B-12 (CYANOCOBALAMIN) 500 MCG tablet Take 500 mcg by mouth daily.     [provider]   Dg Chest 1 View  Result Date: 07/25/2019 CLINICAL DATA:  Right femur fracture.  Preoperative study. EXAM: CHEST  1 VIEW COMPARISON:  Chest x-ray dated Apr 01, 2019. FINDINGS: Right chest wall port catheter with tip at the cavoatrial junction. The heart size and mediastinal contours are within normal limits. Atherosclerotic calcification of the aortic arch. Normal pulmonary vascularity. No focal consolidation, pleural effusion, or pneumothorax. No acute osseous abnormality. IMPRESSION: No active disease. Electronically Signed   By: Titus Dubin M.D.   On: 07/25/2019 09:36   Dg Knee 1-2 Views Right  Result Date: 07/25/2019 CLINICAL DATA:  Fall. EXAM: RIGHT KNEE - 1-2 VIEW; RIGHT FEMUR 2 VIEWS COMPARISON:  Right knee x-rays dated April 24, 2013. FINDINGS: Prior right total knee arthroplasty. No evidence of hardware failure or loosening. Acute spiral fracture of the distal femoral metadiaphysis with 1 cm medial displacement and up to 3 cm anterior displacement. The fracture does not involve the femoral component. No dislocation. No joint effusion. Soft tissues are unremarkable. IMPRESSION: 1. Acute displaced spiral fracture of the distal femoral metadiaphysis. Electronically Signed   By: Titus Dubin M.D.   On: 07/25/2019 09:33   Ct Head Wo Contrast  Result Date: 07/25/2019 CLINICAL DATA:  Fall on blood thinners. History of breast cancer. EXAM: CT HEAD WITHOUT CONTRAST TECHNIQUE: Contiguous axial images were obtained from the base of the skull through the vertex without intravenous contrast. COMPARISON:  11/23/2013 FINDINGS: Brain: There is no evidence of acute infarct, intracranial hemorrhage, mass, midline shift, or extra-axial fluid collection. Confluent hypodensities in the cerebral white matter bilaterally are stable to slightly increased and nonspecific but compatible with extensive chronic small vessel ischemic disease. Mild cerebral atrophy is not considered abnormal for  age. Vascular: Calcified atherosclerosis at the skull base. No hyperdense vessel. Skull: No fracture focal osseous lesion. Sinuses/Orbits: Visualized paranasal sinuses and mastoid air cells are clear. Bilateral cataract extraction is noted. Other: None. IMPRESSION: 1. No evidence of acute intracranial abnormality. 2. Extensive chronic small vessel ischemic disease. Electronically Signed   By: Logan Bores M.D.   On: 07/25/2019 08:48   Dg Hip Unilat W Or Wo Pelvis 2-3 Views Right  Result Date: 07/25/2019 CLINICAL DATA:  Fall. EXAM: DG HIP (WITH OR WITHOUT PELVIS) 2-3V RIGHT COMPARISON:  Right hip x-rays dated February 24, 2013. FINDINGS: There is no evidence of hip fracture or dislocation. There is no evidence of arthropathy or other focal bone abnormality. Osteopenia. Degenerative changes of the lumbar spine and sacroiliac joints. Soft tissues are unremarkable. IMPRESSION: No acute osseous abnormality. Electronically Signed   By: Titus Dubin M.D.   On: 07/25/2019 09:35   Dg Femur Min 2 Views Right  Result Date: 07/25/2019 CLINICAL DATA:  Fall. EXAM: RIGHT KNEE - 1-2 VIEW; RIGHT FEMUR 2 VIEWS COMPARISON:  Right knee x-rays dated April 24, 2013. FINDINGS: Prior right total knee arthroplasty. No evidence of hardware failure or loosening. Acute spiral fracture of the distal femoral metadiaphysis with 1 cm medial displacement and up to 3 cm anterior displacement. The fracture does not involve the femoral component. No dislocation. No joint effusion. Soft tissues are unremarkable. IMPRESSION: 1. Acute displaced spiral fracture of the distal femoral metadiaphysis. Electronically Signed   By: Titus Dubin M.D.   On: 07/25/2019 09:33    Positive ROS: All other systems have been reviewed and were otherwise negative with the exception of those mentioned in the HPI and as above.  Physical Exam: General: Alert, no acute distress Cardiovascular: No pedal edema Respiratory: No cyanosis, no use of accessory  musculature GI: No organomegaly, abdomen is soft and non-tender Skin: No lesions in the area of chief complaint Neurologic: Sensation intact distally Psychiatric: Patient is competent for consent with normal mood and affect Lymphatic: No axillary or cervical lymphadenopathy  MUSCULOSKELETAL: Examination of the right lower extremity reveals a well-healed anterior incision over the knee.  She has an obvious deformity to the thigh.  She has palpable pedal pulses.  She has positive motor function dorsiflexion, plantarflexion, and great toe extension with strength limited by pain.  She reports intact sensation.  Assessment: Periprosthetic right distal femur fracture not involving the implant. Breast cancer currently receiving chemotherapy. Tobacco abuse.  Plan: I discussed the findings with the patient and her daughter.  We discussed that she has a displaced periprosthetic right distal femur fracture.  She will require open reduction internal fixation of the right femur.  We discussed the importance of smoking cessation to reduce the risk of wound healing issues, infection, and nonunion.  If possible, it would be best to delay the next chemotherapy IV infusion by a few weeks in order to ensure healing of her surgical incision.  Due to OR availability issues, surgery will be tomorrow morning at [facility].  Request transfer and hospitalist admission at [facility].  All questions were solicited and answered.    Bertram Savin, MD Cell [phone]    07/25/2019 10:43 AM

## 2019-07-25 NOTE — ED Notes (Signed)
Ice pack applied to pt's right knee.

## 2019-07-25 NOTE — ED Notes (Signed)
Family at bedside. 

## 2019-07-25 NOTE — ED Notes (Signed)
Patient transported to CT 

## 2019-07-25 NOTE — Consult Note (Signed)
ORTHOPAEDIC CONSULTATION  REQUESTING PHYSICIAN: Julianne Rice, MD  PCP:  Gayland Curry, DO  Chief Complaint: Right periprosthetic distal femur fracture  HPI: Andrea [name] is a 84 y.o. female with a history of atrial fibrillation on Eliquis (last dose was 7 PM yesterday), urinary incontinence, depression/anxiety, chronic low back pain with radicular leg pain, frequent falls, sleep apnea, and breast cancer status post surgery and XRT currently receiving chemotherapy IV infusions every 3 weeks (next scheduled on 08/01/2019) who fell while going to the bathroom last night.  She had immediate right thigh pain and deformity.  She was unable to bear weight.  She was brought to the emergency department at [facility], where radiographs revealed a long, spiral periprosthetic right distal femur fracture without loosening of the implant.  A knee immobilizer was placed.  Orthopedic consultation was placed.  I was asked to assume care of the patient by my partner, Dr. Doran Durand.  Past Medical History:  Diagnosis Date   A-fib ([place])    Anemia    Anxiety    Arthritis    hands, spine    Bladder incontinence    Blood transfusion    Cancer ([place]) 02/2019   left breast cancer   Cervicalgia    Constipation    Cough    [place]-onset 08/2009, as of 2014- resolved    Deep venous thrombosis ([place])    post op, rec'd/needed  blood thinner    Depression    Depression with anxiety    Dysrhythmia    a-fib   Falls frequently    pt. reports that she was falling 3 times a day, has had PT at a facility for a while & now is getting home PT 2-3 times/ week    Headache(784.0) 09/27/2011   History of stress test    15-20 yrs.ago    Hypotension    Hypothyroidism    Lumbar radiculopathy 09/26/2011   Pneumonia    never been in hosp. for pneumonia    Prediabetes    Right hand fracture    Monday   Shortness of breath    Sleep apnea    does not use consistently   Past  Surgical History:  Procedure Laterality Date   ANTERIOR CERVICAL DECOMP/DISCECTOMY FUSION N/A 07/09/2013   Procedure: ANTERIOR CERVICAL DECOMPRESSION/DISCECTOMY FUSION 3 LEVELS;  Surgeon: Sinclair Ship, MD;  Location: [place];  Service: Orthopedics;  Laterality: N/A;  Anterior cervical decompression fusion, cervical 3-4, cervical 4-5-, cervical 5-6 with instrumentation, allograft.   APPENDECTOMY     BACK SURGERY     2000   BREAST EXCISIONAL BIOPSY     breast bxs on left x 2, breast bx of right x 1-all benign   EYE SURGERY     cataracts bilateral /w IOL   HERNIA REPAIR     umbilical hernia- [id number]   IR IMAGING GUIDED PORT INSERTION  04/28/2019   JOINT REPLACEMENT  2006,2007   bilateral   LAPAROSCOPIC OVARIAN CYSTECTOMY     not done by laparscopy-abdominal incision   PORTACATH PLACEMENT Right 04/01/2019   Procedure: ATTEMPTED INSERTION PORT-A-CATH WITH ULTRASOUND;  Surgeon: [name], MD;  Location: [place];  Service: General;  Laterality: Right;   RADIOACTIVE SEED GUIDED PARTIAL MASTECTOMY WITH AXILLARY SENTINEL LYMPH NODE BIOPSY Left 04/01/2019   Procedure: LEFT BREAST PARTIAL MASTECTOMY WITH RADIOACTIVE SEED AND LEFT SENTINEL LYMPH NODE BIOPSY;  Surgeon: [name], MD;  Location: [place];  Service: General;  Laterality:  Left;   RE-EXCISION OF BREAST CANCER,SUPERIOR MARGINS Left 04/15/2019   Procedure: RE-EXCISION OF LEFT BREAST CANCER POSITIVE MARGINS;  Surgeon: [name], MD;  Location: [place];  Service: General;  Laterality: Left;   REPLACEMENT TOTAL KNEE BILATERAL     TONSILLECTOMY     Social History   Socioeconomic History   Marital status: Single    Spouse name: Not on file   Number of children: 3   Years of education: College   Highest education level: Not on file  Occupational History   Occupation: Retired Designer, multimedia strain: Not very hard   Food  insecurity    Worry: Never true    Inability: Never true   Transportation needs    Medical: No    Non-medical: No  Tobacco Use   Smoking status: Current Every Day Smoker    Packs/day: 0.50    Years: 20.00    Pack years: 10.00    Types: Cigarettes   Smokeless tobacco: Never Used   Tobacco comment: Started back a few months ago as of 02/20/2019. 5-6 cig's daily  Substance and Sexual Activity   Alcohol use: Yes    Comment: rare   Drug use: No   Sexual activity: Never  Lifestyle   Physical activity    Days per week: 0 days    Minutes per session: 0 min   Stress: Only a little  Relationships   Social connections    Talks on phone: More than three times a week    Gets together: More than three times a week    Attends religious service: More than 4 times per year    Active member of club or organization: Yes    Attends meetings of clubs or organizations: Never    Relationship status: Divorced  Other Topics Concern   Not on file  Social History Narrative   Patient is single and lives alone.   Patient is retired.   Patient has a college education.   Patient has three adult children   Patient is right-handed.   Patient drinks 3-4 cups of caffeine (soda and coffee) daily.   Walks with walker   Family History  Problem Relation Age of Onset   Heart disease Father    Malignant hyperthermia Father    Arthritis Father    Deep vein thrombosis Son    Diabetes Son    Obesity Son    Depression Son    Hypertension Son    Parkinson's disease Mother    Diabetes Daughter    Hypertension Daughter    Anxiety disorder Daughter    Hyperlipidemia Son    Hypertension Son    Cancer Maternal Aunt    Allergies  Allergen Reactions   Clindamycin Hcl Other (See Comments)    REACTION: swelling, pt. Reports that she had a 16 lb. weightgain in one day   Prior to Admission medications   Medication Sig Start Date End Date Taking? Authorizing Provider  anastrozole  (ARIMIDEX) 1 MG tablet Take 1 tablet (1 mg total) by mouth daily. 03/12/19   Nicholas Lose, MD  Brexpiprazole 1 MG TABS Take 1 mg by mouth at bedtime.     [provider]  busPIRone (BUSPAR) 5 MG tablet Take 5-10 mg by mouth See admin instructions. 10 MG IN THE MORNING, 5 MG AT DINNER    [provider]  CALCIUM CITRATE PO Take 600 mg by mouth 2 (two) times daily.  [provider]  clonazePAM (KLONOPIN) 0.5 MG tablet Take 1 tablet by mouth as needed in middle of the nite to go back to sleep 12/07/17   Reed, Tiffany L, DO  DULoxetine (CYMBALTA) 60 MG capsule Take 2 capsules (120 mg total) by mouth daily. 06/17/15   Reed, Tiffany L, DO  ELIQUIS 5 MG TABS tablet TAKE 1 TABLET BY MOUTH TWICE A DAY 06/27/19   Reed, Tiffany L, DO  ferrous sulfate (FERROUSUL) 325 (65 FE) MG tablet Take 325 mg by mouth daily with breakfast.    [provider]  furosemide (LASIX) 40 MG tablet TAKE 1/2 TABLET BY MOUTH EVERY DAY 04/28/19   Reed, Tiffany L, DO  levothyroxine (SYNTHROID) 50 MCG tablet TAKE ONE TABLET BY MOUTH ONCE DAILY 30 MINUTES BEFORE BREAKFAST FOR THYROID 06/17/19   Reed, Tiffany L, DO  mirtazapine (REMERON) 15 MG tablet Take 7.5 mg by mouth at bedtime. 05/12/15   [provider]  Multiple Vitamins-Minerals (MULTIVITAMIN PO) Take 1 tablet by mouth daily.     [provider]  oxybutynin (DITROPAN-XL) 10 MG 24 hr tablet Take 10 mg by mouth daily. 07/08/19   [provider]  oxyCODONE-acetaminophen (PERCOCET) 10-325 MG tablet Take 1 tab every 4 hrs as needed for moderate pain and 2 tabs every 4 hrs as needed for severe pain 07/11/19   Reed, Tiffany L, DO  solifenacin (VESICARE) 10 MG tablet Take 1 tablet by mouth daily. 06/30/19   [provider]  traZODone (DESYREL) 100 MG tablet Take 200 mg by mouth at bedtime. Take 2 tablets by mouth at bedtime    [provider]  vitamin B-12 (CYANOCOBALAMIN) 500 MCG tablet Take 500 mcg by mouth daily.     [provider]   Dg Chest 1 View  Result Date: 07/25/2019 CLINICAL DATA:  Right femur fracture.  Preoperative study. EXAM: CHEST  1 VIEW COMPARISON:  Chest x-ray dated Apr 01, 2019. FINDINGS: Right chest wall port catheter with tip at the cavoatrial junction. The heart size and mediastinal contours are within normal limits. Atherosclerotic calcification of the aortic arch. Normal pulmonary vascularity. No focal consolidation, pleural effusion, or pneumothorax. No acute osseous abnormality. IMPRESSION: No active disease. Electronically Signed   By: Titus Dubin M.D.   On: 07/25/2019 09:36   Dg Knee 1-2 Views Right  Result Date: 07/25/2019 CLINICAL DATA:  Fall. EXAM: RIGHT KNEE - 1-2 VIEW; RIGHT FEMUR 2 VIEWS COMPARISON:  Right knee x-rays dated April 24, 2013. FINDINGS: Prior right total knee arthroplasty. No evidence of hardware failure or loosening. Acute spiral fracture of the distal femoral metadiaphysis with 1 cm medial displacement and up to 3 cm anterior displacement. The fracture does not involve the femoral component. No dislocation. No joint effusion. Soft tissues are unremarkable. IMPRESSION: 1. Acute displaced spiral fracture of the distal femoral metadiaphysis. Electronically Signed   By: Titus Dubin M.D.   On: 07/25/2019 09:33   Ct Head Wo Contrast  Result Date: 07/25/2019 CLINICAL DATA:  Fall on blood thinners. History of breast cancer. EXAM: CT HEAD WITHOUT CONTRAST TECHNIQUE: Contiguous axial images were obtained from the base of the skull through the vertex without intravenous contrast. COMPARISON:  11/23/2013 FINDINGS: Brain: There is no evidence of acute infarct, intracranial hemorrhage, mass, midline shift, or extra-axial fluid collection. Confluent hypodensities in the cerebral white matter bilaterally are stable to slightly increased and nonspecific but compatible with extensive chronic small vessel ischemic disease. Mild cerebral atrophy is not considered abnormal for  age. Vascular: Calcified atherosclerosis at the skull base. No hyperdense vessel. Skull: No fracture focal osseous lesion. Sinuses/Orbits: Visualized paranasal sinuses and mastoid air cells are clear. Bilateral cataract extraction is noted. Other: None. IMPRESSION: 1. No evidence of acute intracranial abnormality. 2. Extensive chronic small vessel ischemic disease. Electronically Signed   By: Logan Bores M.D.   On: 07/25/2019 08:48   Dg Hip Unilat W Or Wo Pelvis 2-3 Views Right  Result Date: 07/25/2019 CLINICAL DATA:  Fall. EXAM: DG HIP (WITH OR WITHOUT PELVIS) 2-3V RIGHT COMPARISON:  Right hip x-rays dated February 24, 2013. FINDINGS: There is no evidence of hip fracture or dislocation. There is no evidence of arthropathy or other focal bone abnormality. Osteopenia. Degenerative changes of the lumbar spine and sacroiliac joints. Soft tissues are unremarkable. IMPRESSION: No acute osseous abnormality. Electronically Signed   By: Titus Dubin M.D.   On: 07/25/2019 09:35   Dg Femur Min 2 Views Right  Result Date: 07/25/2019 CLINICAL DATA:  Fall. EXAM: RIGHT KNEE - 1-2 VIEW; RIGHT FEMUR 2 VIEWS COMPARISON:  Right knee x-rays dated April 24, 2013. FINDINGS: Prior right total knee arthroplasty. No evidence of hardware failure or loosening. Acute spiral fracture of the distal femoral metadiaphysis with 1 cm medial displacement and up to 3 cm anterior displacement. The fracture does not involve the femoral component. No dislocation. No joint effusion. Soft tissues are unremarkable. IMPRESSION: 1. Acute displaced spiral fracture of the distal femoral metadiaphysis. Electronically Signed   By: Titus Dubin M.D.   On: 07/25/2019 09:33    Positive ROS: All other systems have been reviewed and were otherwise negative with the exception of those mentioned in the HPI and as above.  Physical Exam: General: Alert, no acute distress Cardiovascular: No pedal edema Respiratory: No cyanosis, no use of accessory  musculature GI: No organomegaly, abdomen is soft and non-tender Skin: No lesions in the area of chief complaint Neurologic: Sensation intact distally Psychiatric: Patient is competent for consent with normal mood and affect Lymphatic: No axillary or cervical lymphadenopathy  MUSCULOSKELETAL: Examination of the right lower extremity reveals a well-healed anterior incision over the knee.  She has an obvious deformity to the thigh.  She has palpable pedal pulses.  She has positive motor function dorsiflexion, plantarflexion, and great toe extension with strength limited by pain.  She reports intact sensation.  Assessment: Periprosthetic right distal femur fracture not involving the implant. Breast cancer currently receiving chemotherapy. Tobacco abuse.  Plan: I discussed the findings with the patient and her daughter.  We discussed that she has a displaced periprosthetic right distal femur fracture.  She will require open reduction internal fixation of the right femur.  We discussed the importance of smoking cessation to reduce the risk of wound healing issues, infection, and nonunion.  If possible, it would be best to delay the next chemotherapy IV infusion by a few weeks in order to ensure healing of her surgical incision.  Due to OR availability issues, surgery will be tomorrow morning at [facility].  Request transfer and hospitalist admission at [facility].  All questions were solicited and answered.    Bertram Savin, MD Cell [phone]    07/25/2019 10:43 AM

## 2019-07-25 NOTE — ED Provider Notes (Signed)
[facility] DEPT Provider Note   CSN: [id number] Arrival date & time: 07/25/19  [id number]     History   Chief Complaint Chief Complaint  Patient presents with   Knee Pain    HPI Andrea [name] is a 84 y.o. female.     HPI Patient with history of atrial fibrillation and DVT on Xarelto states that she lost her balance and fell back tucking her right leg under her body weight.  Denies striking her head.  No loss of consciousness.  Complaining of right knee swelling and pain.  Unable to ambulate.  Brought in by EMS.  Given 100 mcg of fentanyl en route.  Patient states her pain is currently improved.  Denies sensory loss. Past Medical History:  Diagnosis Date   A-fib ([place])    Anemia    Anxiety    Arthritis    hands, spine    Bladder incontinence    Blood transfusion    Cancer ([place]) 02/2019   left breast cancer   Cervicalgia    Constipation    Cough    Wert-onset 08/2009, as of 2014- resolved    Deep venous thrombosis ([place])    post op, rec'd/needed  blood thinner    Depression    Depression with anxiety    Dysrhythmia    a-fib   Falls frequently    pt. reports that she was falling 3 times a day, has had PT at a facility for a while & now is getting home PT 2-3 times/ week    Headache(784.0) 09/27/2011   History of stress test    15-20 yrs.ago    Hypotension    Hypothyroidism    Lumbar radiculopathy 09/26/2011   Pneumonia    never been in hosp. for pneumonia    Prediabetes    Right hand fracture    Monday   Shortness of breath    Sleep apnea    does not use consistently    Patient Active Problem List   Diagnosis Date Noted   Personal history of fall 07/03/2019   Cigarette nicotine dependence without complication [id number]   Chronic right-sided low back pain with right-sided sciatica 07/03/2019   Overactive bladder 07/03/2019   Normocytic anemia 07/03/2019   Malignant neoplasm of upper-outer quadrant  of left breast in female, estrogen receptor positive ([place]) 03/12/2019   Chronic deep vein thrombosis (DVT) of proximal vein of both lower extremities ([place]) 10/24/2018   Hypercoagulable state due to atrial fibrillation ([place]) 10/18/2017   Type 2 diabetes mellitus without complication, without long-term current use of insulin ([place]) 10/18/2017   Numbness of toes 11/27/2016   Advanced care planning/counseling discussion 04/20/2016   Obesity 04/20/2016   Depression 04/20/2016   Senile osteopenia 04/20/2016   Paroxysmal A-fib ([place]) 04/20/2016   Hyperlipidemia 04/20/2016   OSA on CPAP 10/12/2014   Raynaud phenomenon 10/12/2014   Chronic pain syndrome 10/12/2014   Mixed urge and stress incontinence 10/12/2014   Snoring 06/19/2014   Hypersomnia with sleep apnea, unspecified 06/19/2014   Nocturia more than twice per night 06/19/2014   Palpitations 06/10/2014   Urinary incontinence 06/10/2014   Scoliosis 06/10/2014   Hyperglycemia 03/05/2014   Insomnia 03/05/2014   Chronically dry eyes 02/12/2014   Insomnia due to anxiety and fear 02/12/2014   Hallucination 01/18/2012   UTI (lower urinary tract infection) 01/18/2012   Headache(784.0) 09/27/2011   H/O: encephalitis 09/27/2011   Hyperlipidemia associated with type 2 diabetes mellitus ([place]) 09/26/2011   Lumbar radiculopathy  09/26/2011   Muscle spasm of back 09/26/2011   Visual disturbance 09/26/2011   Falls frequently 09/25/2011   CVD (cerebrovascular disease) 09/25/2011   Hyponatremia 09/25/2011   Obesity, morbid ([place]) 09/25/2011   Delirium 09/25/2011   Long term (current) use of anticoagulants 02/10/2011   Nonspecific (abnormal) findings on radiological and other examination of body structure 05/05/2010   ABNORMAL LUNG XRAY 05/05/2010   Edema 04/21/2010   DEPRESSION/ANXIETY 03/02/2010   HYPOTENSION 03/02/2010   DYSPNEA 03/02/2010   Cough 03/02/2010   CHEST PAIN 03/02/2010   Arthropathia  08/06/2009   Bipolar I disorder, single manic episode ([place]) 08/06/2009   Chronic pain associated with significant psychosocial dysfunction 08/06/2009   Clinical depression 08/06/2009   Adiposity 08/06/2009   Essential (primary) hypertension 08/06/2009   Adult hypothyroidism 08/06/2009    Past Surgical History:  Procedure Laterality Date   ANTERIOR CERVICAL DECOMP/DISCECTOMY FUSION N/A 07/09/2013   Procedure: ANTERIOR CERVICAL DECOMPRESSION/DISCECTOMY FUSION 3 LEVELS;  Surgeon: Sinclair Ship, MD;  Location: [facility];  Service: Orthopedics;  Laterality: N/A;  Anterior cervical decompression fusion, cervical 3-4, cervical 4-5-, cervical 5-6 with instrumentation, allograft.   APPENDECTOMY     BACK SURGERY     2000   BREAST EXCISIONAL BIOPSY     breast bxs on left x 2, breast bx of right x 1-all benign   EYE SURGERY     cataracts bilateral /w IOL   HERNIA REPAIR     umbilical hernia- [id number]   IR IMAGING GUIDED PORT INSERTION  04/28/2019   JOINT REPLACEMENT  2006,2007   bilateral   LAPAROSCOPIC OVARIAN CYSTECTOMY     not done by laparscopy-abdominal incision   PORTACATH PLACEMENT Right 04/01/2019   Procedure: ATTEMPTED INSERTION PORT-A-CATH WITH ULTRASOUND;  Surgeon: [name], MD;  Location: [facility];  Service: General;  Laterality: Right;   RADIOACTIVE SEED GUIDED PARTIAL MASTECTOMY WITH AXILLARY SENTINEL LYMPH NODE BIOPSY Left 04/01/2019   Procedure: LEFT BREAST PARTIAL MASTECTOMY WITH RADIOACTIVE SEED AND LEFT SENTINEL LYMPH NODE BIOPSY;  Surgeon: [name], MD;  Location: [facility];  Service: General;  Laterality: Left;   RE-EXCISION OF BREAST CANCER,SUPERIOR MARGINS Left 04/15/2019   Procedure: RE-EXCISION OF LEFT BREAST CANCER POSITIVE MARGINS;  Surgeon: [name], MD;  Location: [facility];  Service: General;  Laterality: Left;   REPLACEMENT TOTAL KNEE BILATERAL     TONSILLECTOMY       OB  History   No obstetric history on file.      Home Medications    Prior to Admission medications   Medication Sig Start Date End Date Taking? Authorizing Provider  anastrozole (ARIMIDEX) 1 MG tablet Take 1 tablet (1 mg total) by mouth daily. 03/12/19   Nicholas Lose, MD  Brexpiprazole 1 MG TABS Take 1 mg by mouth at bedtime.     [provider]  busPIRone (BUSPAR) 5 MG tablet Take 5-10 mg by mouth See admin instructions. 10 MG IN THE MORNING, 5 MG AT DINNER    [provider]  CALCIUM CITRATE PO Take 600 mg by mouth 2 (two) times daily.    [provider]  clonazePAM (KLONOPIN) 0.5 MG tablet Take 1 tablet by mouth as needed in middle of the nite to go back to sleep 12/07/17   Reed, Tiffany L, DO  DULoxetine (CYMBALTA) 60 MG capsule Take 2 capsules (120 mg total) by mouth daily. 06/17/15   Reed, Tiffany L, DO  ELIQUIS 5 MG TABS tablet TAKE 1  TABLET BY MOUTH TWICE A DAY 06/27/19   Reed, Tiffany L, DO  ferrous sulfate (FERROUSUL) 325 (65 FE) MG tablet Take 325 mg by mouth daily with breakfast.    [provider]  furosemide (LASIX) 40 MG tablet TAKE 1/2 TABLET BY MOUTH EVERY DAY 04/28/19   Reed, Tiffany L, DO  levothyroxine (SYNTHROID) 50 MCG tablet TAKE ONE TABLET BY MOUTH ONCE DAILY 30 MINUTES BEFORE BREAKFAST FOR THYROID 06/17/19   Reed, Tiffany L, DO  mirtazapine (REMERON) 15 MG tablet Take 7.5 mg by mouth at bedtime. 05/12/15   [provider]  Multiple Vitamins-Minerals (MULTIVITAMIN PO) Take 1 tablet by mouth daily.     [provider]  oxyCODONE-acetaminophen (PERCOCET) 10-325 MG tablet Take 1 tab every 4 hrs as needed for moderate pain and 2 tabs every 4 hrs as needed for severe pain 07/11/19   Reed, Tiffany L, DO  solifenacin (VESICARE) 10 MG tablet Take 1 tablet by mouth daily. 06/30/19   [provider]  traZODone (DESYREL) 100 MG tablet Take 200 mg by mouth at bedtime. Take 2 tablets by mouth at bedtime    [provider]    vitamin B-12 (CYANOCOBALAMIN) 500 MCG tablet Take 500 mcg by mouth daily.    [provider]    Family History Family History  Problem Relation Age of Onset   Heart disease Father    Malignant hyperthermia Father    Arthritis Father    Deep vein thrombosis Son    Diabetes Son    Obesity Son    Depression Son    Hypertension Son    Parkinson's disease Mother    Diabetes Daughter    Hypertension Daughter    Anxiety disorder Daughter    Hyperlipidemia Son    Hypertension Son    Cancer Maternal Aunt     Social History Social History   Tobacco Use   Smoking status: Current Every Day Smoker    Packs/day: 0.50    Years: 20.00    Pack years: 10.00    Types: Cigarettes   Smokeless tobacco: Never Used   Tobacco comment: Started back a few months ago as of 02/20/2019. 5-6 cig's daily  Substance Use Topics   Alcohol use: Yes    Comment: rare   Drug use: No     Allergies   Clindamycin hcl   Review of Systems Review of Systems  Constitutional: Negative for chills and fever.  Eyes: Negative for visual disturbance.  Respiratory: Negative for cough and shortness of breath.   Cardiovascular: Positive for leg swelling. Negative for chest pain.  Gastrointestinal: Negative for abdominal pain, nausea and vomiting.  Musculoskeletal: Positive for arthralgias and joint swelling. Negative for neck pain and neck stiffness.  Skin: Negative for rash and wound.  Neurological: Negative for dizziness, syncope, weakness, light-headedness, numbness and headaches.  All other systems reviewed and are negative.    Physical Exam Updated Vital Signs BP 138/69    Pulse 77    Temp 98.3 F (36.8 C) (Oral)    Resp 18    Ht 5\' 1"  (1.549 m)    Wt 90.7 kg    SpO2 98%    BMI 37.79 kg/m   Physical Exam Vitals signs and nursing note reviewed.  Constitutional:      General: She is not in acute distress.    Appearance: Normal appearance. She is well-developed. She is  not ill-appearing.  HENT:     Head: Normocephalic and atraumatic.  Comments: No obvious head injury.    Nose: Nose normal.     Mouth/Throat:     Mouth: Mucous membranes are moist.  Eyes:     Pupils: Pupils are equal, round, and reactive to light.  Neck:     Musculoskeletal: Normal range of motion and neck supple. No neck rigidity or muscular tenderness.     Comments: No posterior midline cervical tenderness to palpation. Cardiovascular:     Rate and Rhythm: Normal rate and regular rhythm.  Pulmonary:     Effort: Pulmonary effort is normal.     Breath sounds: Normal breath sounds.  Abdominal:     General: Bowel sounds are normal.     Palpations: Abdomen is soft.     Tenderness: There is no abdominal tenderness. There is no guarding or rebound.  Musculoskeletal: Normal range of motion.        General: Swelling and tenderness present.     Comments: Right knee with swelling and decreased range of motion due to pain.  Swelling is mostly over the superior lateral portion of the knee.  Patient has anterior scars from previous knee replacement surgery bilaterally.  Patient has mild tenderness to palpation over the right lateral hip.  Difficulty fully assessing due to pain of the right knee.  Full range of motion of the right ankle.  Faint dorsalis pedis and posterior tibial pulses intact.  Foot is warm.  Lymphadenopathy:     Cervical: No cervical adenopathy.  Skin:    General: Skin is warm and dry.     Findings: No erythema or rash.  Neurological:     General: No focal deficit present.     Mental Status: She is alert and oriented to person, place, and time.     Comments: Moving toes freely of the right foot.  Sensation is fully intact.  Difficulty assessing flexion and extension of the right knee and hip due to pain.  5/5 grip strength bilaterally.  Normal strength of the left lower lower extremity.  Psychiatric:        Behavior: Behavior normal.      ED Treatments / Results   Labs (all labs ordered are listed, but only abnormal results are displayed) Labs Reviewed  CBC WITH DIFFERENTIAL/PLATELET - Abnormal; Notable for the following components:      Result Value   WBC 12.2 (*)    Hemoglobin 11.9 (*)    Neutro Abs 10.8 (*)    All other components within normal limits  COMPREHENSIVE METABOLIC PANEL - Abnormal; Notable for the following components:   Glucose, Bld 144 (*)    BUN 26 (*)    Total Protein 6.2 (*)    Albumin 3.4 (*)    All other components within normal limits  SARS CORONAVIRUS 2 (HOSPITAL ORDER, [facility])  PROTIME-INR  APTT    EKG EKG Interpretation  Date/Time:  Friday July 25 2019 09:12:22 EDT Ventricular Rate:  83 PR Interval:    QRS Duration: 107 QT Interval:  412 QTC Calculation: 485 R Axis:   68 Text Interpretation:  Sinus rhythm Low voltage, precordial leads Baseline wander in lead(s) V2 Confirmed by Julianne Rice [phone]) on 07/25/2019 9:16:10 AM   Radiology Dg Chest 1 View  Result Date: 07/25/2019 CLINICAL DATA:  Right femur fracture.  Preoperative study. EXAM: CHEST  1 VIEW COMPARISON:  Chest x-ray dated Apr 01, 2019. FINDINGS: Right chest wall port catheter with tip at the cavoatrial junction. The heart size and  mediastinal contours are within normal limits. Atherosclerotic calcification of the aortic arch. Normal pulmonary vascularity. No focal consolidation, pleural effusion, or pneumothorax. No acute osseous abnormality. IMPRESSION: No active disease. Electronically Signed   By: Titus Dubin M.D.   On: 07/25/2019 09:36   Dg Knee 1-2 Views Right  Result Date: 07/25/2019 CLINICAL DATA:  Fall. EXAM: RIGHT KNEE - 1-2 VIEW; RIGHT FEMUR 2 VIEWS COMPARISON:  Right knee x-rays dated April 24, 2013. FINDINGS: Prior right total knee arthroplasty. No evidence of hardware failure or loosening. Acute spiral fracture of the distal femoral metadiaphysis with 1 cm medial displacement and up to 3 cm anterior  displacement. The fracture does not involve the femoral component. No dislocation. No joint effusion. Soft tissues are unremarkable. IMPRESSION: 1. Acute displaced spiral fracture of the distal femoral metadiaphysis. Electronically Signed   By: Titus Dubin M.D.   On: 07/25/2019 09:33   Ct Head Wo Contrast  Result Date: 07/25/2019 CLINICAL DATA:  Fall on blood thinners. History of breast cancer. EXAM: CT HEAD WITHOUT CONTRAST TECHNIQUE: Contiguous axial images were obtained from the base of the skull through the vertex without intravenous contrast. COMPARISON:  11/23/2013 FINDINGS: Brain: There is no evidence of acute infarct, intracranial hemorrhage, mass, midline shift, or extra-axial fluid collection. Confluent hypodensities in the cerebral white matter bilaterally are stable to slightly increased and nonspecific but compatible with extensive chronic small vessel ischemic disease. Mild cerebral atrophy is not considered abnormal for age. Vascular: Calcified atherosclerosis at the skull base. No hyperdense vessel. Skull: No fracture focal osseous lesion. Sinuses/Orbits: Visualized paranasal sinuses and mastoid air cells are clear. Bilateral cataract extraction is noted. Other: None. IMPRESSION: 1. No evidence of acute intracranial abnormality. 2. Extensive chronic small vessel ischemic disease. Electronically Signed   By: Logan Bores M.D.   On: 07/25/2019 08:48   Dg Hip Unilat W Or Wo Pelvis 2-3 Views Right  Result Date: 07/25/2019 CLINICAL DATA:  Fall. EXAM: DG HIP (WITH OR WITHOUT PELVIS) 2-3V RIGHT COMPARISON:  Right hip x-rays dated February 24, 2013. FINDINGS: There is no evidence of hip fracture or dislocation. There is no evidence of arthropathy or other focal bone abnormality. Osteopenia. Degenerative changes of the lumbar spine and sacroiliac joints. Soft tissues are unremarkable. IMPRESSION: No acute osseous abnormality. Electronically Signed   By: Titus Dubin M.D.   On: 07/25/2019 09:35   Dg  Femur Min 2 Views Right  Result Date: 07/25/2019 CLINICAL DATA:  Fall. EXAM: RIGHT KNEE - 1-2 VIEW; RIGHT FEMUR 2 VIEWS COMPARISON:  Right knee x-rays dated April 24, 2013. FINDINGS: Prior right total knee arthroplasty. No evidence of hardware failure or loosening. Acute spiral fracture of the distal femoral metadiaphysis with 1 cm medial displacement and up to 3 cm anterior displacement. The fracture does not involve the femoral component. No dislocation. No joint effusion. Soft tissues are unremarkable. IMPRESSION: 1. Acute displaced spiral fracture of the distal femoral metadiaphysis. Electronically Signed   By: Titus Dubin M.D.   On: 07/25/2019 09:33    Procedures Procedures (including critical care time)  Medications Ordered in ED Medications  fentaNYL (SUBLIMAZE) injection 50 mcg (50 mcg Intravenous Given 07/25/19 0925)     Initial Impression / Assessment and Plan / ED Course  I have reviewed the triage vital signs and the nursing notes.  Pertinent labs & imaging results that were available during my care of the patient were reviewed by me and considered in my medical decision making (see chart for details).  Discussed with Dr. Doran Durand.  Recommends placing patient in Jones dressing with knee immobilizer.  He spoke with Dr. Lyla Glassing who will see patient and plan on surgical repair tomorrow.  Requesting patient be transferred to [facility].  Spoke with hospitalist and will arrange admission and transfer.   Final Clinical Impressions(s) / ED Diagnoses   Final diagnoses:  Closed fracture of distal end of right femur, unspecified fracture morphology, initial encounter [facility])    ED Discharge Orders    None       Julianne Rice, MD 07/25/19 1037

## 2019-07-25 NOTE — H&P (Signed)
History and Physical    Andrea [name] [id number] DOB: [birth year] DOA: 07/25/2019  PCP: Gayland Curry, DO Patient coming from: Home.  Intermittently uses cane and walker for ambulation.  Chief Complaint: Fall and right leg pain  HPI: Andrea [name] is a 84 y.o. female with history of A. fib on Eliquis, remote bilateral TKA, left breast cancer status post lumpectomy and radiation now on chemo, OSA on CPAP, hypothyroidism, overactive bladder, depression/anxiety and DM-2 presenting with mechanical fall at home.  History provided by patient and patient's daughter at bedside.  Patient was in her usual state of health up until this morning other than intermittent right knee pain for about 2 weeks.  She got up about 5 AM to go to bathroom and stumbled on her foot and fell backward on hard floor.  She intermittently uses cane and walker.  She did not use a cane or walker this time.  Denies hitting her head or loss of consciousness.  Felt right knee pain and swelling.  Swelling has gotten worse.  Currently has no pain except when she moves her right leg.  Was not able to bear weight.  Family called EMS and she was brought to ED.  She is on Eliquis.  Last dose was about 7 PM yesterday. She denies URI symptoms, fever, chills, headache, vision change, palpitation, dizziness, chest pain, shortness of breath, nausea, vomiting, abdominal pain, UTI symptoms, focal weakness, numbness or tingling.  She lives with her daughter.  Smokes about 5 to 6 cigarettes a day.  Interested in nicotine patch.  She denies alcohol recreational drug use.  In ED, hemodynamically stable.  CBC, CMP and PTT/PT/INR not impressive.  COVID-19 negative.  CT head and portable CXR without acute finding.  EKG features normal sinus rhythm.  X-ray of right knee and right femur revealed acute displaced spiral fracture of the distal femoral metadiaphysis.  Orthopedic surgery, Dr. Lyla Glassing consulted and recommended admission to [facility]  with a plan to go to the OR tomorrow.  ROS All review of system negative except for pertinent positives and negatives as history of present illness above. PMH Past Medical History:  Diagnosis Date  . A-fib ([place])   . Anemia   . Anxiety   . Arthritis    hands, spine   . Bladder incontinence   . Blood transfusion   . Cancer ([place]) 02/2019   left breast cancer  . Cervicalgia   . Constipation   . Cough    [place]-onset 08/2009, as of 2014- resolved   . Deep venous thrombosis ([place])    post op, rec'd/needed  blood thinner   . Depression   . Depression with anxiety   . Dysrhythmia    a-fib  . Falls frequently    pt. reports that she was falling 3 times a day, has had PT at a facility for a while & now is getting home PT 2-3 times/ week   . Headache(784.0) 09/27/2011  . History of stress test    15-20 yrs.ago   . Hypotension   . Hypothyroidism   . Lumbar radiculopathy 09/26/2011  . Pneumonia    never been in hosp. for pneumonia   . Prediabetes   . Right hand fracture    Monday  . Shortness of breath   . Sleep apnea    does not use consistently   PSH Past Surgical History:  Procedure Laterality Date  . ANTERIOR CERVICAL DECOMP/DISCECTOMY FUSION N/A 07/09/2013   Procedure: ANTERIOR CERVICAL DECOMPRESSION/DISCECTOMY  FUSION 3 LEVELS;  Surgeon: Sinclair Ship, MD;  Location: [place];  Service: Orthopedics;  Laterality: N/A;  Anterior cervical decompression fusion, cervical 3-4, cervical 4-5-, cervical 5-6 with instrumentation, allograft.  . APPENDECTOMY    . BACK SURGERY     2000  . BREAST EXCISIONAL BIOPSY     breast bxs on left x 2, breast bx of right x 1-all benign  . EYE SURGERY     cataracts bilateral /w IOL  . HERNIA REPAIR     umbilical hernia- [id number]  . IR IMAGING GUIDED PORT INSERTION  04/28/2019  . JOINT REPLACEMENT  2006,2007   bilateral  . LAPAROSCOPIC OVARIAN CYSTECTOMY     not done by laparscopy-abdominal incision  . PORTACATH PLACEMENT Right 04/01/2019    Procedure: ATTEMPTED INSERTION PORT-A-CATH WITH ULTRASOUND;  Surgeon: [name], MD;  Location: [place];  Service: General;  Laterality: Right;  . RADIOACTIVE SEED GUIDED PARTIAL MASTECTOMY WITH AXILLARY SENTINEL LYMPH NODE BIOPSY Left 04/01/2019   Procedure: LEFT BREAST PARTIAL MASTECTOMY WITH RADIOACTIVE SEED AND LEFT SENTINEL LYMPH NODE BIOPSY;  Surgeon: [name], MD;  Location: [place];  Service: General;  Laterality: Left;  . RE-EXCISION OF BREAST CANCER,SUPERIOR MARGINS Left 04/15/2019   Procedure: RE-EXCISION OF LEFT BREAST CANCER POSITIVE MARGINS;  Surgeon: [name], MD;  Location: [place];  Service: General;  Laterality: Left;  . REPLACEMENT TOTAL KNEE BILATERAL    . TONSILLECTOMY     Fam HX Family History  Problem Relation Age of Onset  . Heart disease Father   . Malignant hyperthermia Father   . Arthritis Father   . Deep vein thrombosis Son   . Diabetes Son   . Obesity Son   . Depression Son   . Hypertension Son   . Parkinson's disease Mother   . Diabetes Daughter   . Hypertension Daughter   . Anxiety disorder Daughter   . Hyperlipidemia Son   . Hypertension Son   . Cancer Maternal Aunt     Social Hx  reports that she has been smoking cigarettes. She has a 10.00 pack-year smoking history. She has never used smokeless tobacco. She reports current alcohol use. She reports that she does not use drugs.  Allergy Allergies  Allergen Reactions  . Clindamycin Hcl Other (See Comments)    REACTION: swelling, pt. Reports that she had a 16 lb. weightgain in one day   Home Meds Prior to Admission medications   Medication Sig Start Date End Date Taking? Authorizing Provider  anastrozole (ARIMIDEX) 1 MG tablet Take 1 tablet (1 mg total) by mouth daily. 03/12/19   Nicholas Lose, MD  Brexpiprazole 1 MG TABS Take 1 mg by mouth at bedtime.     [provider]  busPIRone (BUSPAR) 5 MG tablet Take  5-10 mg by mouth See admin instructions. 10 MG IN THE MORNING, 5 MG AT DINNER    [provider]  CALCIUM CITRATE PO Take 600 mg by mouth 2 (two) times daily.    [provider]  clonazePAM (KLONOPIN) 0.5 MG tablet Take 1 tablet by mouth as needed in middle of the nite to go back to sleep 12/07/17   Reed, Tiffany L, DO  DULoxetine (CYMBALTA) 60 MG capsule Take 2 capsules (120 mg total) by mouth daily. 06/17/15   Reed, Tiffany L, DO  ELIQUIS 5 MG TABS tablet TAKE 1 TABLET BY MOUTH TWICE A DAY 06/27/19   Reed, Tiffany L, DO  ferrous sulfate (  FERROUSUL) 325 (65 FE) MG tablet Take 325 mg by mouth daily with breakfast.    [provider]  furosemide (LASIX) 40 MG tablet TAKE 1/2 TABLET BY MOUTH EVERY DAY 04/28/19   Reed, Tiffany L, DO  levothyroxine (SYNTHROID) 50 MCG tablet TAKE ONE TABLET BY MOUTH ONCE DAILY 30 MINUTES BEFORE BREAKFAST FOR THYROID 06/17/19   Reed, Tiffany L, DO  mirtazapine (REMERON) 15 MG tablet Take 7.5 mg by mouth at bedtime. 05/12/15   [provider]  Multiple Vitamins-Minerals (MULTIVITAMIN PO) Take 1 tablet by mouth daily.     [provider]  oxybutynin (DITROPAN-XL) 10 MG 24 hr tablet Take 10 mg by mouth daily. 07/08/19   [provider]  oxyCODONE-acetaminophen (PERCOCET) 10-325 MG tablet Take 1 tab every 4 hrs as needed for moderate pain and 2 tabs every 4 hrs as needed for severe pain 07/11/19   Reed, Tiffany L, DO  solifenacin (VESICARE) 10 MG tablet Take 1 tablet by mouth daily. 06/30/19   [provider]  traZODone (DESYREL) 100 MG tablet Take 200 mg by mouth at bedtime. Take 2 tablets by mouth at bedtime    [provider]  vitamin B-12 (CYANOCOBALAMIN) 500 MCG tablet Take 500 mcg by mouth daily.    [provider]    Physical Exam: Vitals:   07/25/19 0911 07/25/19 0930 07/25/19 1000 07/25/19 1030  BP: (!) 142/73 135/66 138/69 (!) 129/57  Pulse: 81 79 77 75  Resp: 15 16 18 12   Temp:       TempSrc:      SpO2: 95% 97% 98% 99%  Weight:      Height:        GENERAL: No acute distress.  Appears well.  HEENT: MMM.  Vision and hearing grossly intact.  NECK: Supple.  No apparent JVD.  RESP:  No IWOB. Good air movement bilaterally. CVS:  RRR. Heart sounds normal.  ABD/GI/GU: Bowel sounds present. Soft. Non tender.  MSK/EXT: Right lower extremity in immobilizer.  No swelling distally.  DP pulses symmetric bilaterally. SKIN: no apparent skin lesion or wound NEURO: Awake, alert and oriented appropriately.  Cranial nerves, motor and sensory grossly intact.  Gait not tested motor strength in right lower extremity. PSYCH: Calm. Normal affect.   Personally Reviewed Radiological Exams Dg Chest 1 View  Result Date: 07/25/2019 CLINICAL DATA:  Right femur fracture.  Preoperative study. EXAM: CHEST  1 VIEW COMPARISON:  Chest x-ray dated Apr 01, 2019. FINDINGS: Right chest wall port catheter with tip at the cavoatrial junction. The heart size and mediastinal contours are within normal limits. Atherosclerotic calcification of the aortic arch. Normal pulmonary vascularity. No focal consolidation, pleural effusion, or pneumothorax. No acute osseous abnormality. IMPRESSION: No active disease. Electronically Signed   By: Titus Dubin M.D.   On: 07/25/2019 09:36   Dg Knee 1-2 Views Right  Result Date: 07/25/2019 CLINICAL DATA:  Fall. EXAM: RIGHT KNEE - 1-2 VIEW; RIGHT FEMUR 2 VIEWS COMPARISON:  Right knee x-rays dated April 24, 2013. FINDINGS: Prior right total knee arthroplasty. No evidence of hardware failure or loosening. Acute spiral fracture of the distal femoral metadiaphysis with 1 cm medial displacement and up to 3 cm anterior displacement. The fracture does not involve the femoral component. No dislocation. No joint effusion. Soft tissues are unremarkable. IMPRESSION: 1. Acute displaced spiral fracture of the distal femoral metadiaphysis. Electronically Signed   By: Titus Dubin M.D.   On:  07/25/2019 09:33   Ct Head Wo Contrast  Result Date: 07/25/2019 CLINICAL DATA:  Fall on blood thinners. History of breast cancer. EXAM: CT HEAD WITHOUT CONTRAST TECHNIQUE: Contiguous axial images were obtained from the base of the skull through the vertex without intravenous contrast. COMPARISON:  11/23/2013 FINDINGS: Brain: There is no evidence of acute infarct, intracranial hemorrhage, mass, midline shift, or extra-axial fluid collection. Confluent hypodensities in the cerebral white matter bilaterally are stable to slightly increased and nonspecific but compatible with extensive chronic small vessel ischemic disease. Mild cerebral atrophy is not considered abnormal for age. Vascular: Calcified atherosclerosis at the skull base. No hyperdense vessel. Skull: No fracture focal osseous lesion. Sinuses/Orbits: Visualized paranasal sinuses and mastoid air cells are clear. Bilateral cataract extraction is noted. Other: None. IMPRESSION: 1. No evidence of acute intracranial abnormality. 2. Extensive chronic small vessel ischemic disease. Electronically Signed   By: Logan Bores M.D.   On: 07/25/2019 08:48   Dg Hip Unilat W Or Wo Pelvis 2-3 Views Right  Result Date: 07/25/2019 CLINICAL DATA:  Fall. EXAM: DG HIP (WITH OR WITHOUT PELVIS) 2-3V RIGHT COMPARISON:  Right hip x-rays dated February 24, 2013. FINDINGS: There is no evidence of hip fracture or dislocation. There is no evidence of arthropathy or other focal bone abnormality. Osteopenia. Degenerative changes of the lumbar spine and sacroiliac joints. Soft tissues are unremarkable. IMPRESSION: No acute osseous abnormality. Electronically Signed   By: Titus Dubin M.D.   On: 07/25/2019 09:35   Dg Femur Min 2 Views Right  Result Date: 07/25/2019 CLINICAL DATA:  Fall. EXAM: RIGHT KNEE - 1-2 VIEW; RIGHT FEMUR 2 VIEWS COMPARISON:  Right knee x-rays dated April 24, 2013. FINDINGS: Prior right total knee arthroplasty. No evidence of hardware failure or loosening. Acute  spiral fracture of the distal femoral metadiaphysis with 1 cm medial displacement and up to 3 cm anterior displacement. The fracture does not involve the femoral component. No dislocation. No joint effusion. Soft tissues are unremarkable. IMPRESSION: 1. Acute displaced spiral fracture of the distal femoral metadiaphysis. Electronically Signed   By: Titus Dubin M.D.   On: 07/25/2019 09:33     Personally Reviewed Labs: CBC: Recent Labs  Lab 07/25/19 0734  WBC 12.2*  NEUTROABS 10.8*  HGB 11.9*  HCT 37.2  MCV 91.2  PLT [id number]   Basic Metabolic Panel: Recent Labs  Lab 07/25/19 0734  NA 138  K 4.0  CL 102  CO2 27  GLUCOSE 144*  BUN 26*  CREATININE 0.79  CALCIUM 9.3   GFR: Estimated Creatinine Clearance: 55.6 mL/min (by C-G formula based on SCr of 0.79 mg/dL). Liver Function Tests: Recent Labs  Lab 07/25/19 0734  AST 17  ALT 13  ALKPHOS 58  BILITOT 0.6  PROT 6.2*  ALBUMIN 3.4*   No results for input(s): LIPASE, AMYLASE in the last 168 hours. No results for input(s): AMMONIA in the last 168 hours. Coagulation Profile: Recent Labs  Lab 07/25/19 0734  INR 1.2   Cardiac Enzymes: No results for input(s): CKTOTAL, CKMB, CKMBINDEX, TROPONINI in the last 168 hours. BNP (last 3 results) No results for input(s): PROBNP in the last 8760 hours. HbA1C: No results for input(s): HGBA1C in the last 72 hours. CBG: No results for input(s): GLUCAP in the last 168 hours. Lipid Profile: No results for input(s): CHOL, HDL, LDLCALC, TRIG, CHOLHDL, LDLDIRECT in the last 72 hours. Thyroid Function Tests: No results for input(s): TSH, T4TOTAL, FREET4, T3FREE, THYROIDAB in the last 72 hours. Anemia Panel: No results for input(s): VITAMINB12, FOLATE, FERRITIN, TIBC, IRON, RETICCTPCT  in the last 72 hours. Urine analysis:    Component Value Date/Time   COLORURINE YELLOW 07/11/2018 1020   APPEARANCEUR CLEAR 07/11/2018 1020   APPEARANCEUR Cloudy (A) 09/22/2014 1015   LABSPEC 1.012  07/11/2018 1020   PHURINE 6.5 07/11/2018 1020   GLUCOSEU NEGATIVE 07/11/2018 1020   HGBUR NEGATIVE 07/11/2018 1020   BILIRUBINUR neg 09/06/2018 1110   BILIRUBINUR Negative 09/22/2014 1015   KETONESUR NEGATIVE 07/11/2018 1020   PROTEINUR Negative 09/06/2018 1110   PROTEINUR NEGATIVE 07/11/2018 1020   UROBILINOGEN 0.2 09/06/2018 1110   UROBILINOGEN 0.2 03/11/2013 2003   NITRITE neg 09/06/2018 1110   NITRITE NEGATIVE 07/11/2018 1020   LEUKOCYTESUR Negative 09/06/2018 1110   LEUKOCYTESUR 2+ (A) 09/22/2014 1015    Sepsis Labs:  None  Personally Reviewed EKG:  Normal sinus rhythm without acute finding  Assessment/Plan Mechanical fall at home Periprosthetic displaced right distal femoral fracture -Right knee and femoral x-ray revealed acute displaced spiral fracture of the distal femoral metadiaphysis -Dr. Lyla Glassing to take patient to OR in the morning on 9/5. Patient's major cardiac risk is the surgery itself.  -Complete bedrest -PRN pain medications for pain control -Hold home Eliquis.  Last dose 9/3 at 7 PM. -SCD to LLE for VT prophylaxis -N.p.o. after midnight -IV fluid after midnight -PureWick or Foley catheter  Paroxysmal A. Fib: Now in NSR.  Chad-2-Vasc 6-7.  On Eliquis for anticoagulation.  Not on rate or rhythm control. -Hold Eliquis.  Last dose on 9/3 at 7 PM.  Resume after surgery. -Although chad-2-vasc score is high, it is reasonable to forego bridging as patient is in NSR.  Hyperglycemia: History of prediabetes.  Not on medication. Glucose 140 on BMP. -Check hemoglobin A1c -SSI and CBG monitoring  Hypothyroidism: Stable -Continue home Synthroid after med rec  Anxiety/depression: Stable -Continue home Rexulti, BuSpar, Cymbalta, trazodone and Remeron after med rec.  OSA on CPAP -Continue nightly CPAP  Left breast cancer status post lumpectomy and radiation now on chemotherapy -We will add oncology to treatment team  Tobacco use disorder: Smokes about 5 to 6  cigarettes a day. -Discussed the importance of cessation -Nicotine patch  Overactive bladder -Continue home oxybutynin after med rec.  DVT prophylaxis: SCD to left lower extremity Code Status: DNR Family Communication: Patient's daughter at bedside Disposition Plan: Admit to MedSurg floor at [place] called: Orthopedic surgery, Dr. Lyla Glassing Admission status: Inpatient   Mercy Riding MD [facility]  If 7PM-7AM, please contact night-coverage [email or web address] Password [email or web address]  07/25/2019, 11:18 AM

## 2019-07-25 NOTE — ED Notes (Signed)
Daughter, Tammi Klippel, took pts belongings (including hearing aids) home when she left [facility].  Bobbi notified of pts transfer status and provided with new room number at [facility].

## 2019-07-25 NOTE — ED Notes (Signed)
ED TO INPATIENT HANDOFF REPORT  Name/Age/Gender Andrea [name] 84 y.o. female  Code Status    Code Status Orders  (From admission, onward)         Start     Ordered   07/25/19 1201  Do not attempt resuscitation (DNR)  Continuous    Question Answer Comment  In the event of cardiac or respiratory ARREST Do not call a "code blue"   In the event of cardiac or respiratory ARREST Do not perform Intubation, CPR, defibrillation or ACLS   In the event of cardiac or respiratory ARREST Use medication by any route, position, wound care, and other measures to relive pain and suffering. May use oxygen, suction and manual treatment of airway obstruction as needed for comfort.      07/25/19 1200        Code Status History    Date Active Date Inactive Code Status Order ID Comments User Context   04/20/2016 1200 04/01/2019 0606 DNR [id number] Discussed at appt 04/20/16.  Pt reports she [place] and is ready to go when it's time.  Does not want resuscitation.  Is going to complete a living will and hcpoa also.  [place] completed and given to her. Gayland Curry, DO Outpatient   01/18/2012 1113 01/20/2012 2012 DNR [id number]  Wynn Banker, RN ED   09/25/2011 2251 09/28/2011 1523 Full Code [id number]  Martie Round, RN Inpatient   Advance Care Planning Activity    Advance Directive Documentation     Most Recent Value  Type of Advance Directive  Healthcare Power of Attorney, Living will  Pre-existing out of facility DNR order (yellow form or pink MOST form)  -  "MOST" Form in Place?  -      Home/SNF/Other Home  Chief Complaint fall  on knee  Level of Care/Admitting Diagnosis ED Disposition    ED Disposition Condition [place]: [place] [100100]  Level of Care: Med-Surg [16]  Covid Evaluation: Asymptomatic Screening Protocol (No Symptoms)  Diagnosis: Right femoral fracture [facility]) [id number]  Admitting Physician: Mercy Riding  [id number]  Attending Physician: Mercy Riding [id number]  Estimated length of stay: past midnight tomorrow  Certification:: I certify this patient will need inpatient services for at least 2 midnights  PT Class (Do Not Modify): Inpatient [101]  PT Acc Code (Do Not Modify): Private [1]       Medical History Past Medical History:  Diagnosis Date  . A-fib ([place])   . Anemia   . Anxiety   . Arthritis    hands, spine   . Bladder incontinence   . Blood transfusion   . Cancer ([place]) 02/2019   left breast cancer  . Cervicalgia   . Constipation   . Cough    Wert-onset 08/2009, as of 2014- resolved   . Deep venous thrombosis ([place])    post op, rec'd/needed  blood thinner   . Depression   . Depression with anxiety   . Dysrhythmia    a-fib  . Falls frequently    pt. reports that she was falling 3 times a day, has had PT at a facility for a while & now is getting home PT 2-3 times/ week   . Headache(784.0) 09/27/2011  . History of stress test    15-20 yrs.ago   . Hypotension   . Hypothyroidism   . Lumbar radiculopathy 09/26/2011  . Pneumonia    never been in  hosp. for pneumonia   . Prediabetes   . Right hand fracture    Monday  . Shortness of breath   . Sleep apnea    does not use consistently    Allergies Allergies  Allergen Reactions  . Clindamycin Hcl Other (See Comments)    REACTION: swelling, pt. Reports that she had a 16 lb. weightgain in one day    IV Location/Drains/Wounds Patient Lines/Drains/Airways Status   Active Line/Drains/Airways    Name:   Placement date:   Placement time:   Site:   Days:   Implanted Port Right Chest   -    -    Chest      Peripheral IV 07/25/19 Anterior;Proximal;Right Forearm   07/25/19    0644    Forearm   less than 1   Incision (Closed) 04/01/19 Breast Left   04/01/19    0844     115   Incision (Closed) 04/01/19 Axilla Left   04/01/19    0848     115   Incision (Closed) 04/15/19 Breast Left   04/15/19    1329     101           Labs/Imaging Results for orders placed or performed during the hospital encounter of 07/25/19 (from the past 48 hour(s))  CBC with Differential/Platelet     Status: Abnormal   Collection Time: 07/25/19  7:34 AM  Result Value Ref Range   WBC 12.2 (H) 4.0 - 10.5 K/uL   RBC 4.08 3.87 - 5.11 MIL/uL   Hemoglobin 11.9 (L) 12.0 - 15.0 g/dL   HCT 37.2 36.0 - 46.0 %   MCV 91.2 80.0 - 100.0 fL   MCH 29.2 26.0 - 34.0 pg   MCHC 32.0 30.0 - 36.0 g/dL   RDW 15.0 11.5 - 15.5 %   Platelets 197 150 - 400 K/uL   nRBC 0.0 0.0 - 0.2 %   Neutrophils Relative % 89 %   Neutro Abs 10.8 (H) 1.7 - 7.7 K/uL   Lymphocytes Relative 5 %   Lymphs Abs 0.7 0.7 - 4.0 K/uL   Monocytes Relative 5 %   Monocytes Absolute 0.6 0.1 - 1.0 K/uL   Eosinophils Relative 1 %   Eosinophils Absolute 0.1 0.0 - 0.5 K/uL   Basophils Relative 0 %   Basophils Absolute 0.0 0.0 - 0.1 K/uL   Immature Granulocytes 0 %   Abs Immature Granulocytes 0.03 0.00 - 0.07 K/uL    Comment: Performed at [facility], [place] [address], [place], [place] [address]  Comprehensive metabolic panel     Status: Abnormal   Collection Time: 07/25/19  7:34 AM  Result Value Ref Range   Sodium 138 135 - 145 mmol/L   Potassium 4.0 3.5 - 5.1 mmol/L   Chloride 102 98 - 111 mmol/L   CO2 27 22 - 32 mmol/L   Glucose, Bld 144 (H) 70 - 99 mg/dL   BUN 26 (H) 8 - 23 mg/dL   Creatinine, Ser 0.79 0.44 - 1.00 mg/dL   Calcium 9.3 8.9 - 10.3 mg/dL   Total Protein 6.2 (L) 6.5 - 8.1 g/dL   Albumin 3.4 (L) 3.5 - 5.0 g/dL   AST 17 15 - 41 U/L   ALT 13 0 - 44 U/L   Alkaline Phosphatase 58 38 - 126 U/L   Total Bilirubin 0.6 0.3 - 1.2 mg/dL   GFR calc non Af Amer >60 >60 mL/min   GFR calc Af Amer >60 >  60 mL/min   Anion gap 9 5 - 15    Comment: Performed at [facility], [place] [address] [place], [place] [address]  Protime-INR     Status: None   Collection Time: 07/25/19  7:34 AM  Result Value Ref Range   Prothrombin Time 14.9  11.4 - 15.2 seconds   INR 1.2 0.8 - 1.2    Comment: (NOTE) INR goal varies based on device and disease states. Performed at [facility], [place] [address] [place], [place] [address]   APTT     Status: None   Collection Time: 07/25/19  7:34 AM  Result Value Ref Range   aPTT 27 24 - 36 seconds    Comment: Performed at [facility], [place] [address] [place], [place] [address]  SARS Coronavirus [facility] order, Performed in [facility] hospital lab) Nasopharyngeal Nasopharyngeal Swab     Status: None   Collection Time: 07/25/19  7:39 AM   Specimen: Nasopharyngeal Swab  Result Value Ref Range   SARS Coronavirus 2 NEGATIVE NEGATIVE    Comment: (NOTE) If result is NEGATIVE SARS-CoV-2 target nucleic acids are NOT DETECTED. The SARS-CoV-2 RNA is generally detectable in upper and lower  respiratory specimens during the acute phase of infection. The lowest  concentration of SARS-CoV-2 viral copies this assay can detect is 250  copies / mL. A negative result does not preclude SARS-CoV-2 infection  and should not be used as the sole basis for treatment or other  patient management decisions.  A negative result may occur with  improper specimen collection / handling, submission of specimen other  than nasopharyngeal swab, presence of viral mutation(s) within the  areas targeted by this assay, and inadequate number of viral copies  (<250 copies / mL). A negative result must be combined with clinical  observations, patient history, and epidemiological information. If result is POSITIVE SARS-CoV-2 target nucleic acids are DETECTED. The SARS-CoV-2 RNA is generally detectable in upper and lower  respiratory specimens dur ing the acute phase of infection.  Positive  results are indicative of active infection with SARS-CoV-2.  Clinical  correlation with patient history and other diagnostic information is  necessary to determine patient infection status.   Positive results do  not rule out bacterial infection or co-infection with other viruses. If result is PRESUMPTIVE POSTIVE SARS-CoV-2 nucleic acids MAY BE PRESENT.   A presumptive positive result was obtained on the submitted specimen  and confirmed on repeat testing.  While 2019 novel coronavirus  (SARS-CoV-2) nucleic acids may be present in the submitted sample  additional confirmatory testing may be necessary for epidemiological  and / or clinical management purposes  to differentiate between  SARS-CoV-2 and other Sarbecovirus currently known to infect humans.  If clinically indicated additional testing with an alternate test  methodology [phone]) is advised. The SARS-CoV-2 RNA is generally  detectable in upper and lower respiratory sp ecimens during the acute  phase of infection. The expected result is Negative. Fact Sheet for Patients:  StrictlyIdeas.no Fact Sheet for Healthcare Providers: BankingDealers.co.za This test is not yet approved or cleared by the [place] FDA and has been authorized for detection and/or diagnosis of SARS-CoV-2 by FDA under an Emergency Use Authorization (EUA).  This EUA will remain in effect (meaning this test can be used) for the duration of the COVID-19 declaration under Section 564(b)(1) of the Act, 21 U.S.C. section 360bbb-3(b)(1), unless the authorization is terminated or revoked sooner. Performed at [facility], [place] [address] [place], [place] [address]    Dg Chest 1 View  Result Date: 07/25/2019 CLINICAL DATA:  Right femur fracture.  Preoperative study. EXAM: CHEST  1 VIEW COMPARISON:  Chest x-ray dated Apr 01, 2019. FINDINGS: Right chest wall port catheter with tip at the cavoatrial junction. The heart size and mediastinal contours are within normal limits. Atherosclerotic calcification of the aortic arch. Normal pulmonary vascularity. No focal consolidation, pleural  effusion, or pneumothorax. No acute osseous abnormality. IMPRESSION: No active disease. Electronically Signed   By: Titus Dubin M.D.   On: 07/25/2019 09:36   Dg Knee 1-2 Views Right  Result Date: 07/25/2019 CLINICAL DATA:  Fall. EXAM: RIGHT KNEE - 1-2 VIEW; RIGHT FEMUR 2 VIEWS COMPARISON:  Right knee x-rays dated April 24, 2013. FINDINGS: Prior right total knee arthroplasty. No evidence of hardware failure or loosening. Acute spiral fracture of the distal femoral metadiaphysis with 1 cm medial displacement and up to 3 cm anterior displacement. The fracture does not involve the femoral component. No dislocation. No joint effusion. Soft tissues are unremarkable. IMPRESSION: 1. Acute displaced spiral fracture of the distal femoral metadiaphysis. Electronically Signed   By: Titus Dubin M.D.   On: 07/25/2019 09:33   Ct Head Wo Contrast  Result Date: 07/25/2019 CLINICAL DATA:  Fall on blood thinners. History of breast cancer. EXAM: CT HEAD WITHOUT CONTRAST TECHNIQUE: Contiguous axial images were obtained from the base of the skull through the vertex without intravenous contrast. COMPARISON:  11/23/2013 FINDINGS: Brain: There is no evidence of acute infarct, intracranial hemorrhage, mass, midline shift, or extra-axial fluid collection. Confluent hypodensities in the cerebral white matter bilaterally are stable to slightly increased and nonspecific but compatible with extensive chronic small vessel ischemic disease. Mild cerebral atrophy is not considered abnormal for age. Vascular: Calcified atherosclerosis at the skull base. No hyperdense vessel. Skull: No fracture focal osseous lesion. Sinuses/Orbits: Visualized paranasal sinuses and mastoid air cells are clear. Bilateral cataract extraction is noted. Other: None. IMPRESSION: 1. No evidence of acute intracranial abnormality. 2. Extensive chronic small vessel ischemic disease. Electronically Signed   By: Logan Bores M.D.   On: 07/25/2019 08:48   Dg Hip  Unilat W Or Wo Pelvis 2-3 Views Right  Result Date: 07/25/2019 CLINICAL DATA:  Fall. EXAM: DG HIP (WITH OR WITHOUT PELVIS) 2-3V RIGHT COMPARISON:  Right hip x-rays dated February 24, 2013. FINDINGS: There is no evidence of hip fracture or dislocation. There is no evidence of arthropathy or other focal bone abnormality. Osteopenia. Degenerative changes of the lumbar spine and sacroiliac joints. Soft tissues are unremarkable. IMPRESSION: No acute osseous abnormality. Electronically Signed   By: Titus Dubin M.D.   On: 07/25/2019 09:35   Dg Femur Min 2 Views Right  Result Date: 07/25/2019 CLINICAL DATA:  Fall. EXAM: RIGHT KNEE - 1-2 VIEW; RIGHT FEMUR 2 VIEWS COMPARISON:  Right knee x-rays dated April 24, 2013. FINDINGS: Prior right total knee arthroplasty. No evidence of hardware failure or loosening. Acute spiral fracture of the distal femoral metadiaphysis with 1 cm medial displacement and up to 3 cm anterior displacement. The fracture does not involve the femoral component. No dislocation. No joint effusion. Soft tissues are unremarkable. IMPRESSION: 1. Acute displaced spiral fracture of the distal femoral metadiaphysis. Electronically Signed   By: Titus Dubin M.D.   On: 07/25/2019 09:33    Pending Labs [employer] (From admission, onward)    Start     Ordered   Signed and Held  Hemoglobin A1c  Once,   R    Comments: To assess prior glycemic control    Signed and Held   Signed and Held  Hemoglobin A1c  Once,   R     Signed and Held   Signed and Held  Basic metabolic panel  Tomorrow morning,   R     Signed and Held   Signed and Held  CBC  Tomorrow morning,   R     Signed and Held          Vitals/Pain Today's Vitals   07/25/19 1000 07/25/19 1030 07/25/19 1130 07/25/19 1154  BP: 138/69 (!) 129/57 126/64   Pulse: 77 75 80   Resp: 18 12 16    Temp:      TempSrc:      SpO2: 98% 99% 99%   Weight:      Height:      PainSc:    5     Isolation Precautions No active  isolations  Medications Medications  oxyCODONE (Oxy IR/ROXICODONE) immediate release tablet 5 mg (has no administration in time range)  insulin aspart (novoLOG) injection 0-9 Units (has no administration in time range)  morphine 2 MG/ML injection 2 mg (has no administration in time range)  fentaNYL (SUBLIMAZE) injection 50 mcg (50 mcg Intravenous Given 07/25/19 0925)    Mobility walks with device

## 2019-07-25 NOTE — Plan of Care (Signed)
  Problem: Health Behavior/Discharge Planning: Goal: Ability to manage health-related needs will improve Outcome: Progressing   Problem: Clinical Measurements: Goal: Ability to maintain clinical measurements within normal limits will improve 07/25/2019 1722 by Chaney Malling, RN Outcome: Progressing 07/25/2019 1722 by Chaney Malling, RN Outcome: Progressing

## 2019-07-25 NOTE — ED Notes (Signed)
[facility] notified of need for transport. °

## 2019-07-26 ENCOUNTER — Inpatient Hospital Stay (HOSPITAL_COMMUNITY): Payer: Medicare Other

## 2019-07-26 ENCOUNTER — Other Ambulatory Visit: Payer: Self-pay

## 2019-07-26 ENCOUNTER — Inpatient Hospital Stay (HOSPITAL_COMMUNITY): Payer: Medicare Other | Admitting: Anesthesiology

## 2019-07-26 ENCOUNTER — Encounter (HOSPITAL_COMMUNITY)
Admission: EM | Disposition: A | Discharge status: Skilled Nursing Facility | Payer: Self-pay | Source: Home / Self Care | Attending: Student

## 2019-07-26 ENCOUNTER — Encounter (HOSPITAL_COMMUNITY): Payer: Self-pay | Admitting: Registered Nurse

## 2019-07-26 DIAGNOSIS — S72401A Unspecified fracture of lower end of right femur, initial encounter for closed fracture: Secondary | ICD-10-CM

## 2019-07-26 DIAGNOSIS — I10 Essential (primary) hypertension: Secondary | ICD-10-CM

## 2019-07-26 HISTORY — PX: ORIF PERIPROSTHETIC FRACTURE: SHX5034

## 2019-07-26 LAB — CBC
HCT: 31.9 % — ABNORMAL LOW (ref 36.0–46.0)
Hemoglobin: 10.7 g/dL — ABNORMAL LOW (ref 12.0–15.0)
MCH: 29.6 pg (ref 26.0–34.0)
MCHC: 33.5 g/dL (ref 30.0–36.0)
MCV: 88.4 fL (ref 80.0–100.0)
Platelets: 214 10*3/uL (ref 150–400)
RBC: 3.61 MIL/uL — ABNORMAL LOW (ref 3.87–5.11)
RDW: 14.5 % (ref 11.5–15.5)
WBC: 8.4 10*3/uL (ref 4.0–10.5)
nRBC: 0 % (ref 0.0–0.2)

## 2019-07-26 LAB — GLUCOSE, CAPILLARY
Glucose-Capillary: 157 mg/dL — ABNORMAL HIGH (ref 70–99)
Glucose-Capillary: 163 mg/dL — ABNORMAL HIGH (ref 70–99)
Glucose-Capillary: 187 mg/dL — ABNORMAL HIGH (ref 70–99)
Glucose-Capillary: 142 mg/dL — ABNORMAL HIGH (ref 70–99)

## 2019-07-26 LAB — BASIC METABOLIC PANEL
Anion gap: 9 (ref 5–15)
BUN: 24 mg/dL — ABNORMAL HIGH (ref 8–23)
CO2: 25 mmol/L (ref 22–32)
Calcium: 8.9 mg/dL (ref 8.9–10.3)
Chloride: 102 mmol/L (ref 98–111)
Creatinine, Ser: 0.74 mg/dL (ref 0.44–1.00)
GFR calc Af Amer: >60 mL/min (ref >60)
GFR calc non Af Amer: >60 mL/min (ref >60)
Glucose, Bld: 167 mg/dL — ABNORMAL HIGH (ref 70–99)
Potassium: 4.3 mmol/L (ref 3.5–5.1)
Sodium: 136 mmol/L (ref 135–145)

## 2019-07-26 LAB — SARS CORONAVIRUS 2 (TAT 6-24 HRS): SARS Coronavirus 2: NEGATIVE

## 2019-07-26 SURGERY — OPEN REDUCTION INTERNAL FIXATION (ORIF) PERIPROSTHETIC FRACTURE
Anesthesia: General | Site: Leg Upper | Laterality: Right

## 2019-07-26 MED ORDER — ESMOLOL HCL 100 MG/10ML IV SOLN
INTRAVENOUS | Status: AC
  Filled 2019-07-26: qty 10

## 2019-07-26 MED ORDER — PROPOFOL 10 MG/ML IV BOLUS
INTRAVENOUS | Status: DC | PRN
  Administered 2019-07-26: 100 mg via INTRAVENOUS

## 2019-07-26 MED ORDER — APIXABAN 2.5 MG PO TABS
2.5000 mg | ORAL_TABLET | Freq: Two times a day (BID) | ORAL | Status: DC
  Administered 2019-07-27: 2.5 mg via ORAL
  Filled 2019-07-26: qty 1

## 2019-07-26 MED ORDER — PHENYLEPHRINE 40 MCG/ML (10ML) SYRINGE FOR IV PUSH (FOR BLOOD PRESSURE SUPPORT)
PREFILLED_SYRINGE | INTRAVENOUS | Status: AC
  Filled 2019-07-26: qty 20

## 2019-07-26 MED ORDER — CEFAZOLIN SODIUM-DEXTROSE 2-4 GM/100ML-% IV SOLN
2.0000 g | INTRAVENOUS | Status: AC
  Administered 2019-07-26: 2 g via INTRAVENOUS

## 2019-07-26 MED ORDER — PHENYLEPHRINE 40 MCG/ML (10ML) SYRINGE FOR IV PUSH (FOR BLOOD PRESSURE SUPPORT)
PREFILLED_SYRINGE | INTRAVENOUS | Status: DC | PRN
  Administered 2019-07-26 (×4): 80 ug via INTRAVENOUS

## 2019-07-26 MED ORDER — ALBUMIN HUMAN 5 % IV SOLN
INTRAVENOUS | Status: DC | PRN
  Administered 2019-07-26 (×3): via INTRAVENOUS

## 2019-07-26 MED ORDER — CEFAZOLIN SODIUM-DEXTROSE 2-4 GM/100ML-% IV SOLN
INTRAVENOUS | Status: AC
  Filled 2019-07-26: qty 100

## 2019-07-26 MED ORDER — ONDANSETRON HCL 4 MG PO TABS: 4.0000 mg | ORAL_TABLET | Freq: Four times a day (QID) | ORAL | Status: DC | PRN

## 2019-07-26 MED ORDER — SUGAMMADEX SODIUM 200 MG/2ML IV SOLN
INTRAVENOUS | Status: DC | PRN
  Administered 2019-07-26: 200 mg via INTRAVENOUS

## 2019-07-26 MED ORDER — METOCLOPRAMIDE HCL 5 MG/ML IJ SOLN: 5.0000 mg | Freq: Three times a day (TID) | INTRAMUSCULAR | Status: DC | PRN

## 2019-07-26 MED ORDER — ONDANSETRON HCL 4 MG/2ML IJ SOLN
INTRAMUSCULAR | Status: AC
  Filled 2019-07-26: qty 2

## 2019-07-26 MED ORDER — ONDANSETRON HCL 4 MG/2ML IJ SOLN
INTRAMUSCULAR | Status: DC | PRN
  Administered 2019-07-26 (×2): 4 mg via INTRAVENOUS

## 2019-07-26 MED ORDER — MENTHOL 3 MG MT LOZG: 1.0000 | LOZENGE | OROMUCOSAL | Status: DC | PRN

## 2019-07-26 MED ORDER — TRANEXAMIC ACID-NACL 1000-0.7 MG/100ML-% IV SOLN
1000.0000 mg | INTRAVENOUS | Status: AC
  Administered 2019-07-26: 1000 mg via INTRAVENOUS

## 2019-07-26 MED ORDER — DEXAMETHASONE SODIUM PHOSPHATE 10 MG/ML IJ SOLN
INTRAMUSCULAR | Status: DC | PRN
  Administered 2019-07-26: 10 mg via INTRAVENOUS

## 2019-07-26 MED ORDER — DEXAMETHASONE SODIUM PHOSPHATE 10 MG/ML IJ SOLN
INTRAMUSCULAR | Status: AC
  Filled 2019-07-26: qty 1

## 2019-07-26 MED ORDER — SODIUM CHLORIDE 0.9 % IV SOLN
INTRAVENOUS | Status: DC | PRN
  Administered 2019-07-26: 09:00:00 20 ug/min via INTRAVENOUS

## 2019-07-26 MED ORDER — EPHEDRINE 5 MG/ML INJ
INTRAVENOUS | Status: AC
  Filled 2019-07-26: qty 10

## 2019-07-26 MED ORDER — ACETAMINOPHEN 10 MG/ML IV SOLN
INTRAVENOUS | Status: DC | PRN
  Administered 2019-07-26: 1000 mg via INTRAVENOUS

## 2019-07-26 MED ORDER — GLYCOPYRROLATE PF 0.2 MG/ML IJ SOSY
PREFILLED_SYRINGE | INTRAMUSCULAR | Status: AC
  Filled 2019-07-26: qty 1

## 2019-07-26 MED ORDER — FENTANYL CITRATE (PF) 250 MCG/5ML IJ SOLN
INTRAMUSCULAR | Status: AC
  Filled 2019-07-26: qty 5

## 2019-07-26 MED ORDER — ONDANSETRON HCL 4 MG/2ML IJ SOLN
4.0000 mg | Freq: Four times a day (QID) | INTRAMUSCULAR | Status: DC | PRN
  Administered 2019-07-26 – 2019-08-04 (×8): 4 mg via INTRAVENOUS
  Filled 2019-07-26 (×8): qty 2

## 2019-07-26 MED ORDER — FENTANYL CITRATE (PF) 250 MCG/5ML IJ SOLN
INTRAMUSCULAR | Status: DC | PRN
  Administered 2019-07-26: 50 ug via INTRAVENOUS
  Administered 2019-07-26: 25 ug via INTRAVENOUS
  Administered 2019-07-26 (×4): 50 ug via INTRAVENOUS

## 2019-07-26 MED ORDER — EPHEDRINE SULFATE-NACL 50-0.9 MG/10ML-% IV SOSY
PREFILLED_SYRINGE | INTRAVENOUS | Status: DC | PRN
  Administered 2019-07-26: 5 mg via INTRAVENOUS

## 2019-07-26 MED ORDER — 0.9 % SODIUM CHLORIDE (POUR BTL) OPTIME
TOPICAL | Status: DC | PRN
  Administered 2019-07-26: 09:00:00 1000 mL

## 2019-07-26 MED ORDER — DOCUSATE SODIUM 100 MG PO CAPS
100.0000 mg | ORAL_CAPSULE | Freq: Two times a day (BID) | ORAL | Status: DC
  Administered 2019-07-26 – 2019-07-31 (×10): 100 mg via ORAL
  Filled 2019-07-26 (×10): qty 1

## 2019-07-26 MED ORDER — TRANEXAMIC ACID-NACL 1000-0.7 MG/100ML-% IV SOLN
INTRAVENOUS | Status: AC
  Filled 2019-07-26: qty 100

## 2019-07-26 MED ORDER — LACTATED RINGERS IV SOLN
INTRAVENOUS | Status: DC | PRN
  Administered 2019-07-26 (×2): via INTRAVENOUS

## 2019-07-26 MED ORDER — CEFAZOLIN SODIUM-DEXTROSE 2-4 GM/100ML-% IV SOLN
2.0000 g | Freq: Four times a day (QID) | INTRAVENOUS | Status: AC
  Administered 2019-07-26 (×2): 2 g via INTRAVENOUS
  Filled 2019-07-26 (×3): qty 100

## 2019-07-26 MED ORDER — LIDOCAINE 2% (20 MG/ML) 5 ML SYRINGE
INTRAMUSCULAR | Status: DC | PRN
  Administered 2019-07-26: 60 mg via INTRAVENOUS

## 2019-07-26 MED ORDER — PHENOL 1.4 % MT LIQD: 1.0000 | OROMUCOSAL | Status: DC | PRN

## 2019-07-26 MED ORDER — PROPOFOL 10 MG/ML IV BOLUS
INTRAVENOUS | Status: AC
  Filled 2019-07-26: qty 20

## 2019-07-26 MED ORDER — ROCURONIUM BROMIDE 10 MG/ML (PF) SYRINGE
PREFILLED_SYRINGE | INTRAVENOUS | Status: DC | PRN
  Administered 2019-07-26: 20 mg via INTRAVENOUS
  Administered 2019-07-26: 50 mg via INTRAVENOUS
  Administered 2019-07-26: 20 mg via INTRAVENOUS

## 2019-07-26 MED ORDER — ACETAMINOPHEN 10 MG/ML IV SOLN
INTRAVENOUS | Status: AC
  Filled 2019-07-26: qty 100

## 2019-07-26 MED ORDER — METOCLOPRAMIDE HCL 5 MG PO TABS: 5.0000 mg | ORAL_TABLET | Freq: Three times a day (TID) | ORAL | Status: DC | PRN

## 2019-07-26 SURGICAL SUPPLY — 71 items
APPLICATOR CHLORAPREP 3ML ORNG (MISCELLANEOUS) ×3 IMPLANT
BAG DECANTER FOR FLEXI CONT (MISCELLANEOUS) ×3 IMPLANT
BIT DRILL 4.3 (BIT) ×2
BIT DRILL 4.3MM (BIT) ×1
BIT DRILL 4.3X300MM (BIT) IMPLANT
BIT DRILL LONG 3.3 (BIT) ×1 IMPLANT
BIT DRILL LONG 3.3MM (BIT) ×1
BIT DRILL QC 3.3X195 (BIT) ×2 IMPLANT
BRUSH FEMORAL CANAL (MISCELLANEOUS) IMPLANT
CANISTER WOUND CARE 500ML ATS (WOUND CARE) ×2 IMPLANT
CAP LOCK NCB (Cap) ×10 IMPLANT
COVER BACK TABLE 24X17X13 BIG (DRAPES) IMPLANT
COVER WAND RF STERILE (DRAPES) ×3 IMPLANT
DERMABOND ADVANCED (GAUZE/BANDAGES/DRESSINGS) ×2
DERMABOND ADVANCED .7 DNX12 (GAUZE/BANDAGES/DRESSINGS) ×1 IMPLANT
DRAPE HIP W/POCKET STRL (MISCELLANEOUS) ×3 IMPLANT
DRAPE IMP U-DRAPE 54X76 (DRAPES) ×3 IMPLANT
DRAPE INCISE IOBAN 66X45 STRL (DRAPES) ×3 IMPLANT
DRAPE INCISE IOBAN 85X60 (DRAPES) ×3 IMPLANT
DRAPE ORTHO SPLIT 77X108 STRL (DRAPES)
DRAPE POUCH INSTRU U-SHP 10X18 (DRAPES) ×3 IMPLANT
DRAPE SURG ORHT 6 SPLT 77X108 (DRAPES) IMPLANT
DRAPE U-SHAPE 47X51 STRL (DRAPES) ×3 IMPLANT
DRESSING PREVENA PLUS CUSTOM (GAUZE/BANDAGES/DRESSINGS) IMPLANT
DRSG AQUACEL AG ADV 3.5X10 (GAUZE/BANDAGES/DRESSINGS) ×3 IMPLANT
DRSG AQUACEL AG ADV 3.5X14 (GAUZE/BANDAGES/DRESSINGS) ×3 IMPLANT
DRSG PREVENA PLUS CUSTOM (GAUZE/BANDAGES/DRESSINGS) ×3
DURAPREP 26ML APPLICATOR (WOUND CARE) ×3 IMPLANT
ELECT CAUTERY BLADE 6.4 (BLADE) ×3 IMPLANT
ELECT REM PT RETURN 9FT ADLT (ELECTROSURGICAL) ×3
ELECTRODE REM PT RTRN 9FT ADLT (ELECTROSURGICAL) ×1 IMPLANT
GLOVE BIO SURGEON STRL SZ8.5 (GLOVE) ×6 IMPLANT
GLOVE BIOGEL PI IND STRL 8.5 (GLOVE) ×1 IMPLANT
GLOVE BIOGEL PI INDICATOR 8.5 (GLOVE) ×2
GOWN STRL REUS W/ TWL LRG LVL3 (GOWN DISPOSABLE) ×2 IMPLANT
GOWN STRL REUS W/TWL 2XL LVL3 (GOWN DISPOSABLE) ×3 IMPLANT
GOWN STRL REUS W/TWL LRG LVL3 (GOWN DISPOSABLE) ×4
HANDPIECE INTERPULSE COAX TIP (DISPOSABLE)
HOOD PEEL AWAY FLYTE STAYCOOL (MISCELLANEOUS) ×6 IMPLANT
K-WIRE 2.0 (WIRE) ×4
K-WIRE FXSTD 280X2XNS SS (WIRE) ×2
KIT BASIN OR (CUSTOM PROCEDURE TRAY) ×3 IMPLANT
KIT DRSG PREVENA PLUS 7DAY 125 (MISCELLANEOUS) ×2 IMPLANT
KIT TURNOVER KIT B (KITS) ×3 IMPLANT
KWIRE FXSTD 280X2XNS SS (WIRE) IMPLANT
MANIFOLD NEPTUNE II (INSTRUMENTS) ×3 IMPLANT
NS IRRIG 1000ML POUR BTL (IV SOLUTION) ×6 IMPLANT
PACK TOTAL JOINT (CUSTOM PROCEDURE TRAY) ×3 IMPLANT
PACK UNIVERSAL I (CUSTOM PROCEDURE TRAY) ×3 IMPLANT
PAD ARMBOARD 7.5X6 YLW CONV (MISCELLANEOUS) ×6 IMPLANT
PLATE DISTAL FEMUR 15H 317M RT (Plate) ×2 IMPLANT
SCREW 5.0 70MM (Screw) ×4 IMPLANT
SCREW 5.0 80MM (Screw) ×4 IMPLANT
SCREW CORTICAL NCB 5.0X65 (Screw) ×2 IMPLANT
SCREW NCB 4.0X36MM (Screw) ×2 IMPLANT
SCREW NCB 5.0X34MM (Screw) ×4 IMPLANT
SCREW NCB 5.0X36MM (Screw) ×4 IMPLANT
SEALER BIPOLAR AQUA 6.0 (INSTRUMENTS) ×3 IMPLANT
SET HNDPC FAN SPRY TIP SCT (DISPOSABLE) IMPLANT
SUT ETHIBOND NAB CT1 #1 30IN (SUTURE) ×3 IMPLANT
SUT MON AB 2-0 CT1 36 (SUTURE) ×3 IMPLANT
SUT VIC AB 1 CT1 27 (SUTURE) ×6
SUT VIC AB 1 CT1 27XBRD ANBCTR (SUTURE) ×3 IMPLANT
SUT VIC AB 2-0 CT1 27 (SUTURE) ×4
SUT VIC AB 2-0 CT1 TAPERPNT 27 (SUTURE) ×2 IMPLANT
SUT VLOC 180 0 24IN GS25 (SUTURE) ×3 IMPLANT
TOWEL GREEN STERILE (TOWEL DISPOSABLE) ×3 IMPLANT
TOWEL GREEN STERILE FF (TOWEL DISPOSABLE) ×3 IMPLANT
TOWER CARTRIDGE SMART MIX (DISPOSABLE) IMPLANT
TRAY FOLEY MTR SLVR 16FR STAT (SET/KITS/TRAYS/PACK) ×3 IMPLANT
WATER STERILE IRR 1000ML POUR (IV SOLUTION) ×6 IMPLANT

## 2019-07-26 NOTE — Anesthesia Postprocedure Evaluation (Signed)
Anesthesia Post Note  Patient: Andrea [name]  Procedure(s) Performed: OPEN REDUCTION INTERNAL FIXATION (ORIF) RIGHT FEMUR PERIPROSTHETIC FRACTURE (Right Leg Upper)     Patient location during evaluation: PACU Anesthesia Type: General Level of consciousness: awake Pain management: pain level controlled Vital Signs Assessment: post-procedure vital signs reviewed and stable Respiratory status: spontaneous breathing, nonlabored ventilation, respiratory function stable and patient connected to nasal cannula oxygen Cardiovascular status: blood pressure returned to baseline and stable Postop Assessment: no apparent nausea or vomiting Anesthetic complications: no    Last Vitals:  Vitals:   07/26/19 1336 07/26/19 1559  BP: 139/67 137/65  Pulse: 83 87  Resp: 14 14  Temp: 36.8 C 36.9 C  SpO2: 95% 100%    Last Pain:  Vitals:   07/26/19 1559  TempSrc: Oral  PainSc:                  Karyl Kinnier Ellender

## 2019-07-26 NOTE — Plan of Care (Signed)
  Problem: Education: Goal: Knowledge of General Education information will improve Description: Including pain rating scale, medication(s)/side effects and non-pharmacologic comfort measures Outcome: Progressing   Problem: Pain Managment: Goal: General experience of comfort will improve Outcome: Progressing   Problem: Safety: Goal: Ability to remain free from injury will improve Outcome: Progressing   

## 2019-07-26 NOTE — Transfer of Care (Signed)
Immediate Anesthesia Transfer of Care Note  Patient: Andrea [name]  Procedure(s) Performed: OPEN REDUCTION INTERNAL FIXATION (ORIF) RIGHT FEMUR PERIPROSTHETIC FRACTURE (Right Leg Upper)  Patient Location: PACU  Anesthesia Type:General  Level of Consciousness: awake and patient cooperative; mentation within baseline  Airway & Oxygen Therapy: Patient Spontanous Breathing and Patient connected to face mask oxygen  Post-op Assessment: Report given to RN and Post -op Vital signs reviewed and stable  Post vital signs: Reviewed and stable  Last Vitals:  Vitals Value Taken Time  BP 162/71 07/26/19 1218  Temp    Pulse 88 07/26/19 1221  Resp 17 07/26/19 1221  SpO2 100 % 07/26/19 1221  Vitals shown include unvalidated device data.  Last Pain:  Vitals:   07/26/19 0259  TempSrc: Oral  PainSc:       Patients Stated Pain Goal: 3 ([id number])  Complications: No apparent anesthesia complications

## 2019-07-26 NOTE — Plan of Care (Signed)
  Problem: Safety: Goal: Ability to remain free from injury will improve Outcome: Progressing   

## 2019-07-26 NOTE — Interval H&P Note (Signed)
History and Physical Interval Note:  07/26/2019 8:19 AM  Andrea [name]  has presented today for surgery, with the diagnosis of Periprosthetic fracture.  The various methods of treatment have been discussed with the patient and family. After consideration of risks, benefits and other options for treatment, the patient has consented to  Procedure(s): OPEN REDUCTION INTERNAL FIXATION (ORIF) RIGHT FEMUR PERIPROSTHETIC FRACTURE (Right) as a surgical intervention.  The patient's history has been reviewed, patient examined, no change in status, stable for surgery.  I have reviewed the patient's chart and labs.  Questions were answered to the patient's satisfaction.    The risks, benefits, and alternatives were discussed with the patient. There are risks associated with the surgery including, but not limited to, problems with anesthesia (death), infection, differences in leg length/angulation/rotation, fracture of bones, loosening or failure of implants, malunion, nonunion, hematoma (blood accumulation) which may require surgical drainage, blood clots, pulmonary embolism, nerve injury (foot drop), and blood vessel injury. The patient understands these risks and elects to proceed.    Hilton Cork Quang Thorpe

## 2019-07-26 NOTE — Anesthesia Preprocedure Evaluation (Addendum)
Anesthesia Evaluation  Patient identified by MRN, date of birth, ID band Patient awake    Reviewed: Allergy & Precautions, NPO status , Patient's Chart, lab work & pertinent test results  Airway Mallampati: III  TM Distance: >3 FB Neck ROM: Full    Dental  (+) Edentulous Upper, Edentulous Lower   Pulmonary sleep apnea and Continuous Positive Airway Pressure Ventilation , Current Smoker and Patient abstained from smoking.,  Hard of hearing   Pulmonary exam normal breath sounds clear to auscultation       Cardiovascular + DVT  + dysrhythmias Atrial Fibrillation  Rhythm:Irregular Rate:Normal  ECG: SR, rate 84  ECHO: 1. The left ventricle has normal systolic function with an ejection fraction of 60-65%. The cavity size was normal. There is mildly increased left ventricular wall thickness. Left ventricular diastolic parameters were normal.  2. EF normal but abnormal GLS -14.1 suggest f/u in 3 months.  3. The right ventricle has normal systolic function. The cavity was normal. There is no increase in right ventricular wall thickness.  4. The mitral valve is degenerative. Mild thickening of the mitral valve leaflet. Mild calcification of the mitral valve leaflet.  5. The aortic valve is tricuspid. Mild thickening of the aortic valve. Sclerosis without any evidence of stenosis of the aortic valve.   Neuro/Psych  Headaches, PSYCHIATRIC DISORDERS Anxiety Depression Bipolar Disorder  Neuromuscular disease    GI/Hepatic negative GI ROS, Neg liver ROS,   Endo/Other  diabetesHypothyroidism   Renal/GU negative Renal ROS     Musculoskeletal Lumbar radiculopathy   Abdominal (+) + obese,   Peds  Hematology  (+) anemia ,   Anesthesia Other Findings Periprosthetic fracture  Reproductive/Obstetrics                           Anesthesia Physical Anesthesia Plan  ASA: III  Anesthesia Plan: General   Post-op Pain  Management:    Induction: Intravenous  PONV Risk Score and Plan: 2 and Ondansetron, Dexamethasone and Treatment may vary due to age or medical condition  Airway Management Planned: Oral ETT  Additional Equipment:   Intra-op Plan:   Post-operative Plan: Extubation in OR  Informed Consent: I have reviewed the patients History and Physical, chart, labs and discussed the procedure including the risks, benefits and alternatives for the proposed anesthesia with the patient or authorized representative who has indicated his/her understanding and acceptance.   Patient has DNR.  Discussed DNR with patient.     Plan Discussed with: CRNA  Anesthesia Plan Comments: (Patient response to DNR questions: Yes to medications No to chest compressions  No to defibrillation )       Anesthesia Quick Evaluation

## 2019-07-26 NOTE — Op Note (Signed)
OPERATIVE REPORT   07/26/2019  12:37 PM  PATIENT:  Andrea [name]   SURGEON:  Bertram Savin, MD  ASSISTANT:  Griffith Citron, PA-C.   PREOPERATIVE DIAGNOSIS: Right periprosthetic distal femur fracture.  POSTOPERATIVE DIAGNOSIS:  Same.  PROCEDURE: Open reduction internal fixation right femur. Application of negative pressure incisional wound dressing. Interpretation of fluoroscopic images.  ANESTHESIA:   GETA.  ANTIBIOTICS: 2 g Ancef.  IMPLANTS: Zimmer NCB periprosthetic distal femur locking plate, 15 hole. 5.0 mm distal interlocking screw with locking caps x5. 5.0 mm proximal interlocking screw x3. 4.0 mm proximal interlocking screw x1. 20-gauge stainless steel wire.  SPECIMENS: None.  COMPLICATIONS: None.  DISPOSITION: Stable to PACU.  SURGICAL INDICATIONS:  Andrea [name] is a 84 y.o. female with multiple medical problems including atrial fibrillation on Eliquis, urinary incontinence, depression/anxiety, chronic low back pain with leg pain, frequent falls, sleep apnea, and breast cancer status post surgery and XRT currently receiving IV chemotherapy infusions who fell at home.  She [place].  She had right thigh pain and inability to weight-bear.  Radiographs in the emergency department revealed a periprosthetic right distal femur fracture.  She was admitted by the hospitalist for perioperative risk stratification and medical optimization.  We waited 36 hours due to previous Eliquis usage.  She was indicated for ORIF of the right femur.  The risks, benefits, and alternatives were discussed with the patient preoperatively including but not limited to the risks of infection, bleeding, nerve / blood vessel injury, malunion, nonunion, cardiopulmonary complications, the need for repeat surgery, among others, and the patient was willing to proceed.  PROCEDURE IN DETAIL: Identified the patient holding area  using 2 identifiers.  The surgical site was marked by myself.  She was taken to the operating room, and general anesthesia was induced on her bed.  Foley catheter was placed.  She was then transferred to the operating room table and flipped into the left lateral decubitus position.  Axillary roll was placed.  She was secured on a beanbag.  All bony prominences were well-padded.  The right lower extremity was prepped and draped in the normal sterile surgical fashion.  Timeout was called, verifying site and site of surgery.  She did receive IV Ancef within 60 minutes of beginning the procedure.  Fluoroscopy was used to define her anatomy.  I created a standard longitudinal incision over the distal aspect of the femur.  Full-thickness skin flaps were created.  I incised the IT band in line with fibers.  The vastus lateralis was identified.  The vastus lateralis was released off the posterior intermuscular septum and the muscle was retracted anteriorly.  I identified the entire femur fracture.  There was a large butterfly fragment consisting of the anterolateral cortex of the distal femur.  The fracture was comminuted.  The fracture terminated just proximal to the anterior flange of the femoral component.  The knee replacement was stable and intact.  I irrigated the fracture hematoma.  The fracture was reduced with appropriate traction and rotation.  There was so much comminution that I was not able to clamp the fracture into a stable configuration.  Therefore I elected to place a total of 4 sets of double 20-gauge stainless steel wire subperiosteally.  The wires were sequentially tightened while traction was held.  I then used AP and lateral fluoroscopy views of the femur to confirm a near anatomic reduction.  The wires were  then clipped and the knots were tucked anteriorly using a bone tamp.  I then selected a 15 hole NCB periprosthetic distal femur plate.  The plate was attached to the jig.  The plate was slid  into place underneath the vastus lateralis muscle.  I confirmed position of the plate on the AP and lateral fluoroscopy views.  The plate was pinned with a K wire distally.  Proximally, a drill bit was placed bicortically through the pent ultimate hole using a separate stab incision.  I then placed a total of 5 bicortical screws in the distal articular block.  I placed a total of 4 bicortical proximal interlocking screws.  The most proximal screw was a 4.0 millimeter screw to reduce the stress riser.  The others 3 screws were 5.0 millimeter screws.  I placed locking caps over the screws in the distal articular block.  Final AP and lateral fluoroscopy views were obtained of the proximal and distal femur to confirm reduction of the fracture and hardware placement.  The wound was copiously irrigated with saline.  I closed the IT band with #1 interrupted Vicryl suture.  Deep fatty tissue was closed with 2-0 Vicryl suture.  Deep dermal layer was closed with 2-0 Monocryl suture.  Skin was reapproximated with staples. Prevena incisional wound VAC was applied over the entire incision plus the 2 proximal stab incisions according to manufacturer technique.  Suction was applied at 125 mmHg with excellent seal.  The patient was then flipped supine and extubated.  She was taken to the PACU in stable condition.  Sponge, needle, and instrument counts were correct at the end of the case x2.  There were no known complications.  POSTOPERATIVE PLAN: The patient will be readmitted to the hospitalist.  Touchdown weightbearing right lower extremity.  Mobilize out of bed with physical therapy.  Unlimited range of motion to the right knee.  Begin Eliquis at 2.5 mg p.o. twice daily tomorrow morning.  After 48 to 72 hours, the full home dose can be resumed.  Patient will undergo disposition planning.  Delay IV chemotherapy until at least the wound has healed.  Upon discharge, convert house VAC suction unit to a portable Prevena suction  unit.  She will need to return to the office 7 days from discharge for removal of the incisional wound VAC.

## 2019-07-26 NOTE — Progress Notes (Signed)
Pt. Remains unable to wear cpap at this time due to feeling nauseous. RN is aware and will continue to monitor pt.

## 2019-07-26 NOTE — Progress Notes (Signed)
RT went to reassess pt for possible cpap but pt. Was asleep. RN stated not to awake pt. And that pt. Was given med for nausea.

## 2019-07-26 NOTE — Anesthesia Procedure Notes (Signed)
Procedure Name: Intubation Date/Time: 07/26/2019 8:30 AM Performed by: Jearld Pies, CRNA Pre-anesthesia Checklist: Patient identified, Emergency Drugs available, Suction available and Patient being monitored Patient Re-evaluated:Patient Re-evaluated prior to induction Oxygen Delivery Method: Circle System Utilized Preoxygenation: Pre-oxygenation with 100% oxygen Induction Type: IV induction and Rapid sequence Laryngoscope Size: Glidescope and 3 Grade View: Grade I Tube type: Oral Tube size: 7.0 mm Number of attempts: 1 Airway Equipment and Method: Stylet Placement Confirmation: ETT inserted through vocal cords under direct vision,  positive ETCO2 and breath sounds checked- equal and bilateral Secured at: 22 cm Tube secured with: Tape Dental Injury: Teeth and Oropharynx as per pre-operative assessment  Comments: Glidescope utilized d/t previous ACDF in 2014; RSI d/t patient reports of nausea in pre-operative holding.

## 2019-07-26 NOTE — Progress Notes (Signed)
PROGRESS NOTE  Andrea [name] [id number] DOB: 1935/03/22 DOA: 07/25/2019 PCP: Gayland Curry, DO   LOS: 1 day   Brief Narrative / Interim history: 84 year old female with history of A. fib on Eliquis, remote bilateral TKA, left breast cancer status post lumpectomy and radiation now on chemo, OSA on CPAP, hypothyroidism, depression/anxiety, type 2 diabetes mellitus, who presents to the hospital after having a fall at home and pain in her right leg.  Imaging on admission revealed acute displaced spiral fracture of the distal femoral metadiaphysis.  Orthopedic surgery was consulted and patient will be taken to the OR on 9/5  Subjective: Seen postop, quite foggy. No complaints   Assessment & Plan: Active Problems:   Right femoral fracture (HCC)   Principal Problem Periprosthetic displaced right distal femoral fracture -Orthopedic surgery consulted, appreciate input, patient to be taken to the OR on 9/5.  Resume Eliquis peri-op per orthopedic surgery -Pain control, PT postop  Active Problems Paroxysmal A. fib, currently in sinus rhythm -Resume Eliquis as per orthopedic surgery recommendations -She does not appear to be on beta-blockers or calcium channel blockers at home  Hypothyroidism -Continue Synthroid  OSA on CPAP -Continue nightly CPAP  Left breast cancer status post lumpectomy and radiation, now on chemotherapy -Followed by Dr. Lindi Adie as an outpatient  Tobacco use -Nicotine patch  Anxiety/depression -Continue home medications   Scheduled Meds: . [MAR Hold] docusate sodium  100 mg Oral BID  . feeding supplement  296 mL Oral Once  . [MAR Hold] insulin aspart  0-5 Units Subcutaneous QHS  . [MAR Hold] insulin aspart  0-9 Units Subcutaneous TID WC  . [MAR Hold] nicotine  7 mg Transdermal Daily   Continuous Infusions: . 0.9 % NaCl with KCl 20 mEq / L 75 mL/hr at 07/25/19 2335   PRN Meds:.0.9 % irrigation (POUR BTL), [MAR Hold] acetaminophen **OR** [MAR Hold]  acetaminophen, [MAR Hold] bisacodyl, [MAR Hold]  morphine injection, [MAR Hold] ondansetron (ZOFRAN) IV, [MAR Hold] oxyCODONE, [MAR Hold] senna-docusate, [MAR Hold] sodium phosphate, [MAR Hold] zolpidem  DVT prophylaxis: Eliquis post op Code Status: DNR Family Communication: no family at bedside  Disposition Plan: TBD  Consultants:   Orthopedic surgery   Procedures:   None   Antimicrobials:  None    Objective: Vitals:   07/25/19 1230 07/25/19 1429 07/25/19 1951 07/26/19 0259  BP: (!) 123/52 (!) 151/61 (!) 154/69 (!) 148/58  Pulse: 78 80 81 78  Resp: 15 16    Temp:  99.3 F (37.4 C) 98.8 F (37.1 C) 98.6 F (37 C)  TempSrc:  Oral Oral Oral  SpO2: 98% 98% 92% 100%  Weight:    92.2 kg  Height:        Intake/Output Summary (Last 24 hours) at 07/26/2019 1018 Last data filed at 07/26/2019 1016 Gross per 24 hour  Intake 913.9 ml  Output 1300 ml  Net -386.1 ml   Filed Weights   07/25/19 0644 07/26/19 0259  Weight: 90.7 kg 92.2 kg    Examination:  Constitutional: NAD Eyes: PERRL, lids and conjunctivae normal ENMT: Mucous membranes are moist.  Respiratory: clear to auscultation bilaterally, no wheezing, no crackles. Normal respiratory effort.  Cardiovascular: Regular rate and rhythm, no murmurs / rubs / gallops. No LE edema. 2+ pedal pulses. Abdomen: no tenderness. Bowel sounds positive.  Musculoskeletal: no clubbing / cyanosis. Skin: no rashes  Data Reviewed: I have independently reviewed following labs and imaging studies   CBC: Recent Labs  Lab 07/25/19 0734 07/26/19 0401  WBC 12.2* 8.4  NEUTROABS 10.8*  --   HGB 11.9* 10.7*  HCT 37.2 31.9*  MCV 91.2 88.4  PLT 197 [id number]   Basic Metabolic Panel: Recent Labs  Lab 07/25/19 0734 07/26/19 0401  NA 138 136  K 4.0 4.3  CL 102 102  CO2 27 25  GLUCOSE 144* 167*  BUN 26* 24*  CREATININE 0.79 0.74  CALCIUM 9.3 8.9   GFR: Estimated Creatinine Clearance: 56.1 mL/min (by C-G formula based on SCr of 0.74  mg/dL). Liver Function Tests: Recent Labs  Lab 07/25/19 0734  AST 17  ALT 13  ALKPHOS 58  BILITOT 0.6  PROT 6.2*  ALBUMIN 3.4*   No results for input(s): LIPASE, AMYLASE in the last 168 hours. No results for input(s): AMMONIA in the last 168 hours. Coagulation Profile: Recent Labs  Lab 07/25/19 0734  INR 1.2   Cardiac Enzymes: No results for input(s): CKTOTAL, CKMB, CKMBINDEX, TROPONINI in the last 168 hours. BNP (last 3 results) No results for input(s): PROBNP in the last 8760 hours. HbA1C: No results for input(s): HGBA1C in the last 72 hours. CBG: Recent Labs  Lab 07/25/19 1323 07/25/19 2111 07/26/19 0612  GLUCAP 139* 176* 157*   Lipid Profile: No results for input(s): CHOL, HDL, LDLCALC, TRIG, CHOLHDL, LDLDIRECT in the last 72 hours. Thyroid Function Tests: No results for input(s): TSH, T4TOTAL, FREET4, T3FREE, THYROIDAB in the last 72 hours. Anemia Panel: No results for input(s): VITAMINB12, FOLATE, FERRITIN, TIBC, IRON, RETICCTPCT in the last 72 hours. Urine analysis:    Component Value Date/Time   COLORURINE YELLOW 07/11/2018 1020   APPEARANCEUR CLEAR 07/11/2018 1020   APPEARANCEUR Cloudy (A) 09/22/2014 1015   LABSPEC 1.012 07/11/2018 1020   PHURINE 6.5 07/11/2018 1020   GLUCOSEU NEGATIVE 07/11/2018 1020   HGBUR NEGATIVE 07/11/2018 1020   BILIRUBINUR neg 09/06/2018 1110   BILIRUBINUR Negative 09/22/2014 1015   KETONESUR NEGATIVE 07/11/2018 1020   PROTEINUR Negative 09/06/2018 1110   PROTEINUR NEGATIVE 07/11/2018 1020   UROBILINOGEN 0.2 09/06/2018 1110   UROBILINOGEN 0.2 03/11/2013 2003   NITRITE neg 09/06/2018 1110   NITRITE NEGATIVE 07/11/2018 1020   LEUKOCYTESUR Negative 09/06/2018 1110   LEUKOCYTESUR 2+ (A) 09/22/2014 1015   Sepsis Labs: Invalid input(s): PROCALCITONIN, LACTICIDVEN  Recent Results (from the past 240 hour(s))  SARS Coronavirus 2 [facility] order, Performed in [facility] hospital lab) Nasopharyngeal Nasopharyngeal Swab      Status: None   Collection Time: 07/25/19  7:39 AM   Specimen: Nasopharyngeal Swab  Result Value Ref Range Status   SARS Coronavirus 2 NEGATIVE NEGATIVE Final    Comment: (NOTE) If result is NEGATIVE SARS-CoV-2 target nucleic acids are NOT DETECTED. The SARS-CoV-2 RNA is generally detectable in upper and lower  respiratory specimens during the acute phase of infection. The lowest  concentration of SARS-CoV-2 viral copies this assay can detect is 250  copies / mL. A negative result does not preclude SARS-CoV-2 infection  and should not be used as the sole basis for treatment or other  patient management decisions.  A negative result may occur with  improper specimen collection / handling, submission of specimen other  than nasopharyngeal swab, presence of viral mutation(s) within the  areas targeted by this assay, and inadequate number of viral copies  (<250 copies / mL). A negative result must be combined with clinical  observations, patient history, and epidemiological information. If result is POSITIVE SARS-CoV-2 target nucleic acids are DETECTED. The SARS-CoV-2 RNA is generally detectable in upper and  lower  respiratory specimens dur ing the acute phase of infection.  Positive  results are indicative of active infection with SARS-CoV-2.  Clinical  correlation with patient history and other diagnostic information is  necessary to determine patient infection status.  Positive results do  not rule out bacterial infection or co-infection with other viruses. If result is PRESUMPTIVE POSTIVE SARS-CoV-2 nucleic acids MAY BE PRESENT.   A presumptive positive result was obtained on the submitted specimen  and confirmed on repeat testing.  While 2019 novel coronavirus  (SARS-CoV-2) nucleic acids may be present in the submitted sample  additional confirmatory testing may be necessary for epidemiological  and / or clinical management purposes  to differentiate between  SARS-CoV-2 and other  Sarbecovirus currently known to infect humans.  If clinically indicated additional testing with an alternate test  methodology [id number]) is advised. The SARS-CoV-2 RNA is generally  detectable in upper and lower respiratory sp ecimens during the acute  phase of infection. The expected result is Negative. Fact Sheet for Patients:  StrictlyIdeas.no Fact Sheet for Healthcare Providers: BankingDealers.co.za This test is not yet approved or cleared by the [place] FDA and has been authorized for detection and/or diagnosis of SARS-CoV-2 by FDA under an Emergency Use Authorization (EUA).  This EUA will remain in effect (meaning this test can be used) for the duration of the COVID-19 declaration under Section 564(b)(1) of the Act, 21 U.S.C. section 360bbb-3(b)(1), unless the authorization is terminated or revoked sooner. Performed at [facility], [place] [address], [place], [place] [address]   Surgical PCR screen     Status: None   Collection Time: 07/25/19  5:34 PM   Specimen: Nasal Mucosa; Nasal Swab  Result Value Ref Range Status   MRSA, PCR NEGATIVE NEGATIVE Final   Staphylococcus aureus NEGATIVE NEGATIVE Final    Comment: (NOTE) The Xpert SA Assay (FDA approved for NASAL specimens in patients 32 years of age and older), is one component of a comprehensive surveillance program. It is not intended to diagnose infection nor to guide or monitor treatment. Performed at [facility], [place] [address] [place], [place] [address]   SARS CORONAVIRUS 2 (TAT 6-24 HRS) Nasopharyngeal Nasopharyngeal Swab     Status: None   Collection Time: 07/25/19  6:23 PM   Specimen: Nasopharyngeal Swab  Result Value Ref Range Status   SARS Coronavirus 2 NEGATIVE NEGATIVE Final    Comment: (NOTE) SARS-CoV-2 target nucleic acids are NOT DETECTED. The SARS-CoV-2 RNA is generally detectable in upper and lower respiratory specimens during  the acute phase of infection. Negative results do not preclude SARS-CoV-2 infection, do not rule out co-infections with other pathogens, and should not be used as the sole basis for treatment or other patient management decisions. Negative results must be combined with clinical observations, patient history, and epidemiological information. The expected result is Negative. Fact Sheet for Patients: SugarRoll.be Fact Sheet for Healthcare Providers: https://www.woods-mathews.com/ This test is not yet approved or cleared by the [place] FDA and  has been authorized for detection and/or diagnosis of SARS-CoV-2 by FDA under an Emergency Use Authorization (EUA). This EUA will remain  in effect (meaning this test can be used) for the duration of the COVID-19 declaration under Section 56 4(b)(1) of the Act, 21 U.S.C. section 360bbb-3(b)(1), unless the authorization is terminated or revoked sooner. Performed at [facility], [place] [address], [place], [place] [address]       Radiology Studies: Dg Chest 1 View  Result Date: 07/25/2019 CLINICAL DATA:  Right femur fracture.  Preoperative study. EXAM: CHEST  1 VIEW COMPARISON:  Chest x-ray dated Apr 01, 2019. FINDINGS: Right chest wall port catheter with tip at the cavoatrial junction. The heart size and mediastinal contours are within normal limits. Atherosclerotic calcification of the aortic arch. Normal pulmonary vascularity. No focal consolidation, pleural effusion, or pneumothorax. No acute osseous abnormality. IMPRESSION: No active disease. Electronically Signed   By: Titus Dubin M.D.   On: 07/25/2019 09:36   Dg Knee 1-2 Views Right  Result Date: 07/25/2019 CLINICAL DATA:  Fall. EXAM: RIGHT KNEE - 1-2 VIEW; RIGHT FEMUR 2 VIEWS COMPARISON:  Right knee x-rays dated April 24, 2013. FINDINGS: Prior right total knee arthroplasty. No evidence of hardware failure or loosening. Acute spiral fracture of  the distal femoral metadiaphysis with 1 cm medial displacement and up to 3 cm anterior displacement. The fracture does not involve the femoral component. No dislocation. No joint effusion. Soft tissues are unremarkable. IMPRESSION: 1. Acute displaced spiral fracture of the distal femoral metadiaphysis. Electronically Signed   By: Titus Dubin M.D.   On: 07/25/2019 09:33   Ct Head Wo Contrast  Result Date: 07/25/2019 CLINICAL DATA:  Fall on blood thinners. History of breast cancer. EXAM: CT HEAD WITHOUT CONTRAST TECHNIQUE: Contiguous axial images were obtained from the base of the skull through the vertex without intravenous contrast. COMPARISON:  11/23/2013 FINDINGS: Brain: There is no evidence of acute infarct, intracranial hemorrhage, mass, midline shift, or extra-axial fluid collection. Confluent hypodensities in the cerebral white matter bilaterally are stable to slightly increased and nonspecific but compatible with extensive chronic small vessel ischemic disease. Mild cerebral atrophy is not considered abnormal for age. Vascular: Calcified atherosclerosis at the skull base. No hyperdense vessel. Skull: No fracture focal osseous lesion. Sinuses/Orbits: Visualized paranasal sinuses and mastoid air cells are clear. Bilateral cataract extraction is noted. Other: None. IMPRESSION: 1. No evidence of acute intracranial abnormality. 2. Extensive chronic small vessel ischemic disease. Electronically Signed   By: Logan Bores M.D.   On: 07/25/2019 08:48   Dg Hip Unilat W Or Wo Pelvis 2-3 Views Right  Result Date: 07/25/2019 CLINICAL DATA:  Fall. EXAM: DG HIP (WITH OR WITHOUT PELVIS) 2-3V RIGHT COMPARISON:  Right hip x-rays dated February 24, 2013. FINDINGS: There is no evidence of hip fracture or dislocation. There is no evidence of arthropathy or other focal bone abnormality. Osteopenia. Degenerative changes of the lumbar spine and sacroiliac joints. Soft tissues are unremarkable. IMPRESSION: No acute osseous  abnormality. Electronically Signed   By: Titus Dubin M.D.   On: 07/25/2019 09:35   Dg Femur Min 2 Views Right  Result Date: 07/25/2019 CLINICAL DATA:  Fall. EXAM: RIGHT KNEE - 1-2 VIEW; RIGHT FEMUR 2 VIEWS COMPARISON:  Right knee x-rays dated April 24, 2013. FINDINGS: Prior right total knee arthroplasty. No evidence of hardware failure or loosening. Acute spiral fracture of the distal femoral metadiaphysis with 1 cm medial displacement and up to 3 cm anterior displacement. The fracture does not involve the femoral component. No dislocation. No joint effusion. Soft tissues are unremarkable. IMPRESSION: 1. Acute displaced spiral fracture of the distal femoral metadiaphysis. Electronically Signed   By: Titus Dubin M.D.   On: 07/25/2019 09:33    Marzetta Board, MD, PhD [facility]  Contact via  [email or web address]  [place] P: [phone] F: [phone]

## 2019-07-27 LAB — CBC
HCT: 20.4 % — ABNORMAL LOW (ref 36.0–46.0)
Hemoglobin: 6.8 g/dL — CL (ref 12.0–15.0)
MCH: 29.4 pg (ref 26.0–34.0)
MCHC: 33.3 g/dL (ref 30.0–36.0)
MCV: 88.3 fL (ref 80.0–100.0)
Platelets: 177 10*3/uL (ref 150–400)
RBC: 2.31 MIL/uL — ABNORMAL LOW (ref 3.87–5.11)
RDW: 14.9 % (ref 11.5–15.5)
WBC: 12.1 10*3/uL — ABNORMAL HIGH (ref 4.0–10.5)
nRBC: 0 % (ref 0.0–0.2)

## 2019-07-27 LAB — BASIC METABOLIC PANEL
Anion gap: 10 (ref 5–15)
BUN: 25 mg/dL — ABNORMAL HIGH (ref 8–23)
CO2: 25 mmol/L (ref 22–32)
Calcium: 8.5 mg/dL — ABNORMAL LOW (ref 8.9–10.3)
Chloride: 100 mmol/L (ref 98–111)
Creatinine, Ser: 0.75 mg/dL (ref 0.44–1.00)
GFR calc Af Amer: >60 mL/min (ref >60)
GFR calc non Af Amer: >60 mL/min (ref >60)
Glucose, Bld: 144 mg/dL — ABNORMAL HIGH (ref 70–99)
Potassium: 3.8 mmol/L (ref 3.5–5.1)
Sodium: 135 mmol/L (ref 135–145)

## 2019-07-27 LAB — GLUCOSE, CAPILLARY
Glucose-Capillary: 136 mg/dL — ABNORMAL HIGH (ref 70–99)
Glucose-Capillary: 142 mg/dL — ABNORMAL HIGH (ref 70–99)
Glucose-Capillary: 126 mg/dL — ABNORMAL HIGH (ref 70–99)
Glucose-Capillary: 131 mg/dL — ABNORMAL HIGH (ref 70–99)

## 2019-07-27 LAB — POCT I-STAT 4, (NA,K, GLUC, HGB,HCT)
Glucose, Bld: 165 mg/dL — ABNORMAL HIGH (ref 70–99)
HCT: 30 % — ABNORMAL LOW (ref 36.0–46.0)
Hemoglobin: 10.2 g/dL — ABNORMAL LOW (ref 12.0–15.0)
Potassium: 4.4 mmol/L (ref 3.5–5.1)
Sodium: 136 mmol/L (ref 135–145)

## 2019-07-27 LAB — PREPARE RBC (CROSSMATCH)

## 2019-07-27 LAB — HEMOGLOBIN AND HEMATOCRIT, BLOOD
HCT: 23.2 % — ABNORMAL LOW (ref 36.0–46.0)
Hemoglobin: 7.9 g/dL — ABNORMAL LOW (ref 12.0–15.0)

## 2019-07-27 MED ORDER — SODIUM CHLORIDE 0.9% IV SOLUTION: Freq: Once | INTRAVENOUS | Status: DC

## 2019-07-27 MED ORDER — APIXABAN 2.5 MG PO TABS
2.5000 mg | ORAL_TABLET | Freq: Two times a day (BID) | ORAL | Status: DC
  Administered 2019-07-28 – 2019-07-31 (×7): 2.5 mg via ORAL
  Filled 2019-07-27 (×7): qty 1

## 2019-07-27 NOTE — Plan of Care (Signed)
  Problem: Safety: Goal: Ability to remain free from injury will improve Outcome: Progressing   

## 2019-07-27 NOTE — Progress Notes (Signed)
PT Cancellation Note  Patient Details Name: Andrea [name] MRN: [id number] DOB: 1935/06/21   Cancelled Treatment:    Reason Eval/Treat Not Completed: Other (comment)   Pt's Hgb is low, and she is about to receive blood; she has also just started lunch;  Will follow up later today as time allows;  Otherwise, will follow up for PT tomorrow;   Thank you,  Roney Marion, PT  Acute Rehabilitation Services Pager [phone] Office [phone]     Colletta [place] 07/27/2019, 12:20 PM

## 2019-07-27 NOTE — Progress Notes (Signed)
   Subjective:  Patient reports pain as moderate.  Feeling "ok" right now.  Awaiting a unit of pRBCs.  Denies CP/SOB/N/V.  Objective:   VITALS:   Vitals:   07/27/19 0419 07/27/19 0500 07/27/19 0543 07/27/19 0819  BP: (!) 162/56  (!) 163/71 (!) 162/60  Pulse: 93  92 98  Resp:    14  Temp: 98.5 F (36.9 C)   98.4 F (36.9 C)  TempSrc: Axillary   Oral  SpO2: 98%  97% 97%  Weight:  92 kg    Height:        Neurologically intact Neurovascular intact Sensation intact distally Intact pulses distally Dorsiflexion/Plantar flexion intact Incision: dressing C/D/I and VAC in place on incision Compartment soft .   Lab Results  Component Value Date   WBC 12.1 (H) 07/27/2019   HGB 6.8 (LL) 07/27/2019   HCT 20.4 (L) 07/27/2019   MCV 88.3 07/27/2019   PLT 177 07/27/2019   BMET    Component Value Date/Time   NA 135 07/27/2019 0727   NA 139 08/14/2016   K 3.8 07/27/2019 0727   CL 100 07/27/2019 0727   CO2 25 07/27/2019 0727   GLUCOSE 144 (H) 07/27/2019 0727   BUN 25 (H) 07/27/2019 0727   BUN 28 (A) 08/14/2016   CREATININE 0.75 07/27/2019 0727   CREATININE 0.94 (H) 06/30/2019 1005   CALCIUM 8.5 (L) 07/27/2019 0727   GFRNONAA >60 07/27/2019 0727   GFRNONAA 58 (L) 06/20/2019 0936   GFRNONAA 57 (L) 10/16/2018 0849   GFRAA >60 07/27/2019 0727   GFRAA >60 06/20/2019 0936   GFRAA 66 10/16/2018 0849     Assessment/Plan: 1 Day Post-Op   Active Problems:   Right femoral fracture (HCC)   Up with therapy TDWB to RLE- ROM at knee as tolerated Maintain VAC to RLE, change to home battery pack before DC home eliquis 2.5 mg bid with SCDs for DVT pps  Medical management per medicine team   Nicholes Stairs 07/27/2019, 8:55 AM   Geralynn Rile, MD [phone]

## 2019-07-27 NOTE — Plan of Care (Signed)

## 2019-07-27 NOTE — Discharge Instructions (Signed)
Dr. Rod Can Adult Hip & Knee Specialist [facility] [address] [place], [place] [address] [phone]   POSTOPERATIVE DIRECTIONS    Rehabilitation, Guidelines Following Surgery   WEIGHT BEARING Other:  Touch down weight bearing right leg with walker   [place] items at home which could result in a fall. This includes throw rugs or furniture in walking pathways.  Continue medications as instructed at time of discharge.  You may have some home medications which will be placed on hold until you complete the course of blood thinner medication. Do not put on socks or shoes without following the instructions of your caregivers.   Sit on chairs with arms. Use the chair arms to help push yourself up when arising.  Arrange for the use of a toilet seat elevator so you are not sitting low.   Walk with walker as instructed.  You may resume a sexual relationship in one month or when given the OK by your caregiver.  Use walker as long as suggested by your caregivers.  Avoid periods of inactivity such as sitting longer than an hour when not asleep. This helps prevent blood clots.  You may return to work once you are cleared by Engineer, production.  Do not drive a car for 6 weeks or until released by your surgeon.  Do not drive while taking narcotics.  Wear elastic stockings for two weeks following surgery during the day but you may remove then at night.  Make sure you keep all of your appointments after your operation with all of your doctors and caregivers. You should call the office at the above phone number and make an appointment for approximately two weeks after the date of your surgery. Please pick up a stool softener and laxative for home use as long as you are requiring pain medications.  ICE to the affected hip every three hours for 30 minutes at a time and then as needed for pain and swelling. Continue to use ice on the hip for  pain and swelling from surgery. You may notice swelling that will progress down to the foot and ankle.  This is normal after surgery.  Elevate the leg when you are not up walking on it.   It is important for you to complete the blood thinner medication as prescribed by your doctor.  Continue to use the breathing machine which will help keep your temperature down.  It is common for your temperature to cycle up and down following surgery, especially at night when you are not up moving around and exerting yourself.  The breathing machine keeps your lungs expanded and your temperature down.  RANGE OF MOTION AND STRENGTHENING EXERCISES  These exercises are designed to help you keep full movement of your hip joint. Follow your caregiver's or physical therapist's instructions. Perform all exercises about fifteen times, three times per day or as directed. Exercise both hips, even if you have had only one joint replacement. These exercises can be done on a training (exercise) mat, on the floor, on a table or on a bed. Use whatever works the best and is most comfortable for you. Use music or television while you are exercising so that the exercises are a pleasant break in your day. This will make your life better with the exercises acting as a break in routine you can look forward to.  Lying on your back, slowly slide your foot toward your buttocks, raising your knee up  off the floor. Then slowly slide your foot back down until your leg is straight again.  Lying on your back spread your legs as far apart as you can without causing discomfort.  Lying on your side, raise your upper leg and foot straight up from the floor as far as is comfortable. Slowly lower the leg and repeat.  Lying on your back, tighten up the muscle in the front of your thigh (quadriceps muscles). You can do this by keeping your leg straight and trying to raise your heel off the floor. This helps strengthen the largest muscle supporting your  knee.  Lying on your back, tighten up the muscles of your buttocks both with the legs straight and with the knee bent at a comfortable angle while keeping your heel on the floor.   SKILLED REHAB INSTRUCTIONS: If the patient is transferred to a skilled rehab facility following release from the hospital, a list of the current medications will be sent to the facility for the patient to continue.  When discharged from the skilled rehab facility, please have the facility set up the patient's [place] prior to being released. Also, the skilled facility will be responsible for providing the patient with their medications at time of release from the facility to include their pain medication and their blood thinner medication. If the patient is still at the rehab facility at time of the two week follow up appointment, the skilled rehab facility will also need to assist the patient in arranging follow up appointment in our office and any transportation needs.  MAKE SURE YOU:  Understand these instructions.  Will watch your condition.  Will get help right away if you are not doing well or get worse.  Pick up stool softner and laxative for home use following surgery while on pain medications. Keep incisional VAC dressing clean and dry. Do NOT remove. Charge VAC unit nightly. Continue to use ice for pain and swelling after surgery. Do not use any lotions or creams on the incision until instructed by your surgeon.     Information on my medicine - ELIQUIS (apixaban)  This medication education was reviewed with me or my healthcare representative as part of my discharge preparation.    Why was Eliquis prescribed for you? Eliquis was prescribed for you to reduce the risk of a blood clot forming that can cause a stroke if you have a medical condition called atrial fibrillation (a type of irregular heartbeat).  What do You need to know about Eliquis ? Take your Eliquis TWICE DAILY -  one tablet in the morning and one tablet in the evening with or without food. If you have difficulty swallowing the tablet whole please discuss with your pharmacist how to take the medication safely.  Take Eliquis exactly as prescribed by your doctor and DO NOT stop taking Eliquis without talking to the doctor who prescribed the medication.  Stopping may increase your risk of developing a stroke.  Refill your prescription before you run out.  After discharge, you should have regular check-up appointments with your healthcare provider that is prescribing your Eliquis.  In the future your dose may need to be changed if your kidney function or weight changes by a significant amount or as you get older.  What do you do if you miss a dose? If you miss a dose, take it as soon as you remember on the same day and resume taking twice daily.  Do not take more than  one dose of ELIQUIS at the same time to make up a missed dose.  Important Safety Information A possible side effect of Eliquis is bleeding. You should call your healthcare provider right away if you experience any of the following: ? Bleeding from an injury or your nose that does not stop. ? Unusual colored urine (red or dark brown) or unusual colored stools (red or black). ? Unusual bruising for unknown reasons. ? A serious fall or if you hit your head (even if there is no bleeding).  Some medicines may interact with Eliquis and might increase your risk of bleeding or clotting while on Eliquis. To help avoid this, consult your healthcare provider or pharmacist prior to using any new prescription or non-prescription medications, including herbals, vitamins, non-steroidal anti-inflammatory drugs (NSAIDs) and supplements.  This website has more information on Eliquis (apixaban): http://www.eliquis.com/eliquis/home

## 2019-07-27 NOTE — Progress Notes (Signed)
PROGRESS NOTE  Andrea [name] [id number] DOB: June 16, 1935 DOA: 07/25/2019 PCP: Gayland Curry, DO   LOS: 2 days   Brief Narrative / Interim history: 84 year old female with history of A. fib on Eliquis, remote bilateral TKA, left breast cancer status post lumpectomy and radiation now on chemo, OSA on CPAP, hypothyroidism, depression/anxiety, type 2 diabetes mellitus, who presents to the hospital after having a fall at home and pain in her right leg.  Imaging on admission revealed acute displaced spiral fracture of the distal femoral metadiaphysis.  Orthopedic surgery was consulted and patient will be taken to the OR on 9/5  Subjective: Feels weak this morning, denies any chest pain, denies any shortness of breath.  No abdominal pain, no nausea or vomiting  Assessment & Plan: Active Problems:   Right femoral fracture (HCC)   Principal Problem Periprosthetic displaced right distal femoral fracture -Orthopedic surgery consulted, appreciate input, she is status post operative repair with ORIF right femur, wound VAC in place, touchdown weightbearing on the right lower extremity.  Eliquis to begin at 2.5 mg twice daily on 9/6, and after 48-72 hours of full home dose to be resumed.  Praveena wound VAC on discharge -PT to evaluate  Active Problems Paroxysmal A. fib, currently in sinus rhythm -Resume Eliquis as per orthopedic surgery -She does not appear to be on beta-blockers or calcium channel blockers at home  Acute blood loss anemia -Postop, hemoglobin 6.8 this morning, will transfuse 1 unit of packed red blood cells.  No clinical evidence of bleeding suspect there is also a dilutional component  Hypothyroidism -Continue Synthroid  OSA on CPAP -Continue nightly CPAP  Left breast cancer status post lumpectomy and radiation, now on chemotherapy -Followed by Dr. Lindi Adie as an outpatient  Tobacco use -Nicotine patch  Anxiety/depression -Continue home medications   Scheduled  Meds: . sodium chloride   Intravenous Once  . [START ON 07/28/2019] apixaban  2.5 mg Oral BID  . docusate sodium  100 mg Oral BID  . insulin aspart  0-5 Units Subcutaneous QHS  . insulin aspart  0-9 Units Subcutaneous TID WC  . nicotine  7 mg Transdermal Daily   Continuous Infusions:  PRN Meds:.bisacodyl, menthol-cetylpyridinium **OR** phenol, metoCLOPramide **OR** metoCLOPramide (REGLAN) injection, morphine injection, ondansetron **OR** ondansetron (ZOFRAN) IV, oxyCODONE, senna-docusate, sodium phosphate, zolpidem  DVT prophylaxis: Eliquis post op Code Status: DNR Family Communication: no family at bedside  Disposition Plan: TBD  Consultants:   Orthopedic surgery   Procedures:   None   Antimicrobials:  None    Objective: Vitals:   07/27/19 0419 07/27/19 0500 07/27/19 0543 07/27/19 0819  BP: (!) 162/56  (!) 163/71 (!) 162/60  Pulse: 93  92 98  Resp:    14  Temp: 98.5 F (36.9 C)   98.4 F (36.9 C)  TempSrc: Axillary   Oral  SpO2: 98%  97% 97%  Weight:  92 kg    Height:        Intake/Output Summary (Last 24 hours) at 07/27/2019 1135 Last data filed at 07/27/2019 0420 Gross per 24 hour  Intake 1234 ml  Output 800 ml  Net 434 ml   Filed Weights   07/25/19 0644 07/26/19 0259 07/27/19 0500  Weight: 90.7 kg 92.2 kg 92 kg    Examination:  Constitutional: No distress Eyes: Pale conjunctive a, no scleral icterus ENMT: Moist mucous membranes Respiratory: Clear to auscultation bilaterally without wheezing or crackles, normal respiratory effort Cardiovascular: Regular rate and rhythm, no murmurs appreciated.  No peripheral  edema, good pulses Abdomen: Soft, nontender, nondistended, positive bowel sounds Musculoskeletal: no clubbing / cyanosis. Skin: No rashes seen  Data Reviewed: I have independently reviewed following labs and imaging studies   CBC: Recent Labs  Lab 07/25/19 0734 07/26/19 0401 07/26/19 1012 07/27/19 0727  WBC 12.2* 8.4  --  12.1*  NEUTROABS  10.8*  --   --   --   HGB 11.9* 10.7* 10.2* 6.8*  HCT 37.2 31.9* 30.0* 20.4*  MCV 91.2 88.4  --  88.3  PLT 197 214  --  [id number]   Basic Metabolic Panel: Recent Labs  Lab 07/25/19 0734 07/26/19 0401 07/26/19 1012 07/27/19 0727  NA 138 136 136 135  K 4.0 4.3 4.4 3.8  CL 102 102  --  100  CO2 27 25  --  25  GLUCOSE 144* 167* 165* 144*  BUN 26* 24*  --  25*  CREATININE 0.79 0.74  --  0.75  CALCIUM 9.3 8.9  --  8.5*   GFR: Estimated Creatinine Clearance: 56.1 mL/min (by C-G formula based on SCr of 0.75 mg/dL). Liver Function Tests: Recent Labs  Lab 07/25/19 0734  AST 17  ALT 13  ALKPHOS 58  BILITOT 0.6  PROT 6.2*  ALBUMIN 3.4*   No results for input(s): LIPASE, AMYLASE in the last 168 hours. No results for input(s): AMMONIA in the last 168 hours. Coagulation Profile: Recent Labs  Lab 07/25/19 0734  INR 1.2   Cardiac Enzymes: No results for input(s): CKTOTAL, CKMB, CKMBINDEX, TROPONINI in the last 168 hours. BNP (last 3 results) No results for input(s): PROBNP in the last 8760 hours. HbA1C: No results for input(s): HGBA1C in the last 72 hours. CBG: Recent Labs  Lab 07/26/19 0612 07/26/19 1221 07/26/19 1600 07/26/19 2058 07/27/19 0618  GLUCAP 157* 163* 187* 142* 136*   Lipid Profile: No results for input(s): CHOL, HDL, LDLCALC, TRIG, CHOLHDL, LDLDIRECT in the last 72 hours. Thyroid Function Tests: No results for input(s): TSH, T4TOTAL, FREET4, T3FREE, THYROIDAB in the last 72 hours. Anemia Panel: No results for input(s): VITAMINB12, FOLATE, FERRITIN, TIBC, IRON, RETICCTPCT in the last 72 hours. Urine analysis:    Component Value Date/Time   COLORURINE YELLOW 07/11/2018 1020   APPEARANCEUR CLEAR 07/11/2018 1020   APPEARANCEUR Cloudy (A) 09/22/2014 1015   LABSPEC 1.012 07/11/2018 1020   PHURINE 6.5 07/11/2018 1020   GLUCOSEU NEGATIVE 07/11/2018 1020   HGBUR NEGATIVE 07/11/2018 1020   BILIRUBINUR neg 09/06/2018 1110   BILIRUBINUR Negative 09/22/2014 1015    KETONESUR NEGATIVE 07/11/2018 1020   PROTEINUR Negative 09/06/2018 1110   PROTEINUR NEGATIVE 07/11/2018 1020   UROBILINOGEN 0.2 09/06/2018 1110   UROBILINOGEN 0.2 03/11/2013 2003   NITRITE neg 09/06/2018 1110   NITRITE NEGATIVE 07/11/2018 1020   LEUKOCYTESUR Negative 09/06/2018 1110   LEUKOCYTESUR 2+ (A) 09/22/2014 1015   Sepsis Labs: Invalid input(s): PROCALCITONIN, LACTICIDVEN  Recent Results (from the past 240 hour(s))  SARS Coronavirus 2 [facility] order, Performed in [facility] hospital lab) Nasopharyngeal Nasopharyngeal Swab     Status: None   Collection Time: 07/25/19  7:39 AM   Specimen: Nasopharyngeal Swab  Result Value Ref Range Status   SARS Coronavirus 2 NEGATIVE NEGATIVE Final    Comment: (NOTE) If result is NEGATIVE SARS-CoV-2 target nucleic acids are NOT DETECTED. The SARS-CoV-2 RNA is generally detectable in upper and lower  respiratory specimens during the acute phase of infection. The lowest  concentration of SARS-CoV-2 viral copies this assay can detect is 250  copies /  mL. A negative result does not preclude SARS-CoV-2 infection  and should not be used as the sole basis for treatment or other  patient management decisions.  A negative result may occur with  improper specimen collection / handling, submission of specimen other  than nasopharyngeal swab, presence of viral mutation(s) within the  areas targeted by this assay, and inadequate number of viral copies  (<250 copies / mL). A negative result must be combined with clinical  observations, patient history, and epidemiological information. If result is POSITIVE SARS-CoV-2 target nucleic acids are DETECTED. The SARS-CoV-2 RNA is generally detectable in upper and lower  respiratory specimens dur ing the acute phase of infection.  Positive  results are indicative of active infection with SARS-CoV-2.  Clinical  correlation with patient history and other diagnostic information is  necessary to determine  patient infection status.  Positive results do  not rule out bacterial infection or co-infection with other viruses. If result is PRESUMPTIVE POSTIVE SARS-CoV-2 nucleic acids MAY BE PRESENT.   A presumptive positive result was obtained on the submitted specimen  and confirmed on repeat testing.  While 2019 novel coronavirus  (SARS-CoV-2) nucleic acids may be present in the submitted sample  additional confirmatory testing may be necessary for epidemiological  and / or clinical management purposes  to differentiate between  SARS-CoV-2 and other Sarbecovirus currently known to infect humans.  If clinically indicated additional testing with an alternate test  methodology [phone]) is advised. The SARS-CoV-2 RNA is generally  detectable in upper and lower respiratory sp ecimens during the acute  phase of infection. The expected result is Negative. Fact Sheet for Patients:  StrictlyIdeas.no Fact Sheet for Healthcare Providers: BankingDealers.co.za This test is not yet approved or cleared by the [place] FDA and has been authorized for detection and/or diagnosis of SARS-CoV-2 by FDA under an Emergency Use Authorization (EUA).  This EUA will remain in effect (meaning this test can be used) for the duration of the COVID-19 declaration under Section 564(b)(1) of the Act, 21 U.S.C. section 360bbb-3(b)(1), unless the authorization is terminated or revoked sooner. Performed at [facility], [place] [address], [place], [place] [address]   Surgical PCR screen     Status: None   Collection Time: 07/25/19  5:34 PM   Specimen: Nasal Mucosa; Nasal Swab  Result Value Ref Range Status   MRSA, PCR NEGATIVE NEGATIVE Final   Staphylococcus aureus NEGATIVE NEGATIVE Final    Comment: (NOTE) The Xpert SA Assay (FDA approved for NASAL specimens in patients 78 years of age and older), is one component of a comprehensive surveillance  program. It is not intended to diagnose infection nor to guide or monitor treatment. Performed at [facility], [place] [address] [place], [place] [address]   SARS CORONAVIRUS 2 (TAT 6-24 HRS) Nasopharyngeal Nasopharyngeal Swab     Status: None   Collection Time: 07/25/19  6:23 PM   Specimen: Nasopharyngeal Swab  Result Value Ref Range Status   SARS Coronavirus 2 NEGATIVE NEGATIVE Final    Comment: (NOTE) SARS-CoV-2 target nucleic acids are NOT DETECTED. The SARS-CoV-2 RNA is generally detectable in upper and lower respiratory specimens during the acute phase of infection. Negative results do not preclude SARS-CoV-2 infection, do not rule out co-infections with other pathogens, and should not be used as the sole basis for treatment or other patient management decisions. Negative results must be combined with clinical observations, patient history, and epidemiological information. The expected result is Negative. Fact Sheet  for Patients: SugarRoll.be Fact Sheet for Healthcare Providers: https://www.woods-mathews.com/ This test is not yet approved or cleared by the [place] FDA and  has been authorized for detection and/or diagnosis of SARS-CoV-2 by FDA under an Emergency Use Authorization (EUA). This EUA will remain  in effect (meaning this test can be used) for the duration of the COVID-19 declaration under Section 56 4(b)(1) of the Act, 21 U.S.C. section 360bbb-3(b)(1), unless the authorization is terminated or revoked sooner. Performed at [facility], [place] [address], [place], [place] [address]       Radiology Studies: Dg C-arm 1-60 Min  Result Date: 07/26/2019 CLINICAL DATA:  Right femoral fracture fixation. EXAM: DG C-ARM 1-60 MIN CONTRAST:  None. FLUOROSCOPY TIME:  Fluoroscopy Time:  2 minutes 20 seconds COMPARISON:  July 26, 2019 FINDINGS: Intraoperative images from sideplate and screw fixation of distal femoral  fracture demonstrate placement of hardware with improved alignment at the fracture line. Expected soft tissue swelling. IMPRESSION: Intraoperative images from sideplate and screw fixation of distal femoral fracture with improved alignment at the fracture line. Electronically Signed   By: Fidela Salisbury M.D.   On: 07/26/2019 13:00   Dg Femur, Min 2 Views Right  Result Date: 07/26/2019 CLINICAL DATA:  Right femur fixation. EXAM: RIGHT FEMUR 2 VIEWS COMPARISON:  July 25, 2019 FINDINGS: Intraoperative fluoroscopic images from sideplate and screw fixation of distal femoral fracture demonstrate placement of hardware and improved alignment at the fracture line. Intact right knee arthroplasty is noted. Fluoroscopy time is recorded as 2 minutes 20 seconds. IMPRESSION: Intraoperative fluoroscopic images from sideplate and screw fixation of distal femoral fracture. Electronically Signed   By: Fidela Salisbury M.D.   On: 07/26/2019 12:58   Dg Femur Port, Min 2 Views Right  Result Date: 07/26/2019 CLINICAL DATA:  Status post ORIF of femur fracture. EXAM: RIGHT FEMUR PORTABLE 2 VIEW COMPARISON:  07/25/2019. FINDINGS: Interval open reduction and internal fixation of distal femur fracture. Screw and plate device with cerclage wires are identified and appear to be in anatomic alignment. No complicating features identified. Previous right knee arthroplasty. IMPRESSION: 1. Status post ORIF of distal femur fracture. Electronically Signed   By: Kerby Moors M.D.   On: 07/26/2019 13:14    Marzetta Board, MD, PhD [facility]  Contact via  [email or web address]  [place] P: [phone] F: [phone]

## 2019-07-28 LAB — TYPE AND SCREEN
ABO/RH(D): A POS
ABO/RH(D): A POS
Antibody Screen: NEGATIVE
Antibody Screen: NEGATIVE
Unit division: 0
Unit division: 0

## 2019-07-28 LAB — GLUCOSE, CAPILLARY
Glucose-Capillary: 132 mg/dL — ABNORMAL HIGH (ref 70–99)
Glucose-Capillary: 121 mg/dL — ABNORMAL HIGH (ref 70–99)
Glucose-Capillary: 117 mg/dL — ABNORMAL HIGH (ref 70–99)
Glucose-Capillary: 141 mg/dL — ABNORMAL HIGH (ref 70–99)

## 2019-07-28 LAB — CBC
HCT: 24.5 % — ABNORMAL LOW (ref 36.0–46.0)
Hemoglobin: 8.3 g/dL — ABNORMAL LOW (ref 12.0–15.0)
MCH: 29.3 pg (ref 26.0–34.0)
MCHC: 33.9 g/dL (ref 30.0–36.0)
MCV: 86.6 fL (ref 80.0–100.0)
Platelets: 168 10*3/uL (ref 150–400)
RBC: 2.83 MIL/uL — ABNORMAL LOW (ref 3.87–5.11)
RDW: 14.9 % (ref 11.5–15.5)
WBC: 10.8 10*3/uL — ABNORMAL HIGH (ref 4.0–10.5)
nRBC: 0 % (ref 0.0–0.2)

## 2019-07-28 LAB — BPAM RBC
Blood Product Expiration Date: 202009262359
Blood Product Expiration Date: 202009262359
ISSUE DATE / TIME: 202009061149
ISSUE DATE / TIME: 202009061149
Unit Type and Rh: 6200
Unit Type and Rh: 6200

## 2019-07-28 LAB — BASIC METABOLIC PANEL
Anion gap: 10 (ref 5–15)
BUN: 19 mg/dL (ref 8–23)
CO2: 24 mmol/L (ref 22–32)
Calcium: 8.2 mg/dL — ABNORMAL LOW (ref 8.9–10.3)
Chloride: 98 mmol/L (ref 98–111)
Creatinine, Ser: 0.63 mg/dL (ref 0.44–1.00)
GFR calc Af Amer: >60 mL/min (ref >60)
GFR calc non Af Amer: >60 mL/min (ref >60)
Glucose, Bld: 123 mg/dL — ABNORMAL HIGH (ref 70–99)
Potassium: 3.5 mmol/L (ref 3.5–5.1)
Sodium: 132 mmol/L — ABNORMAL LOW (ref 135–145)

## 2019-07-28 MED ORDER — OXYBUTYNIN CHLORIDE ER 10 MG PO TB24
10.0000 mg | ORAL_TABLET | Freq: Every day | ORAL | Status: DC
  Administered 2019-07-28 – 2019-08-02 (×6): 10 mg via ORAL
  Filled 2019-07-28 (×6): qty 1

## 2019-07-28 MED ORDER — BREXPIPRAZOLE 1 MG PO TABS
1.0000 mg | ORAL_TABLET | Freq: Every day | ORAL | Status: DC
  Administered 2019-07-28: 1 mg via ORAL
  Filled 2019-07-28 (×2): qty 1

## 2019-07-28 MED ORDER — DULOXETINE HCL 60 MG PO CPEP
120.0000 mg | ORAL_CAPSULE | Freq: Every day | ORAL | Status: DC
  Administered 2019-07-28 – 2019-07-29 (×2): 120 mg via ORAL
  Filled 2019-07-28: qty 2

## 2019-07-28 MED ORDER — BUSPIRONE HCL 5 MG PO TABS
10.0000 mg | ORAL_TABLET | Freq: Every day | ORAL | Status: DC
  Administered 2019-07-28 – 2019-07-29 (×2): 10 mg via ORAL
  Filled 2019-07-28: qty 2

## 2019-07-28 MED ORDER — LEVOTHYROXINE SODIUM 50 MCG PO TABS
50.0000 ug | ORAL_TABLET | Freq: Every day | ORAL | Status: DC
  Administered 2019-07-29 – 2019-08-06 (×9): 50 ug via ORAL
  Filled 2019-07-28 (×9): qty 1

## 2019-07-28 MED ORDER — MIRTAZAPINE 15 MG PO TABS
7.5000 mg | ORAL_TABLET | Freq: Every day | ORAL | Status: DC
  Administered 2019-07-28 – 2019-07-31 (×4): 7.5 mg via ORAL
  Filled 2019-07-28 (×4): qty 1

## 2019-07-28 MED ORDER — BUSPIRONE HCL 5 MG PO TABS
5.0000 mg | ORAL_TABLET | Freq: Every day | ORAL | Status: DC
  Administered 2019-07-28: 5 mg via ORAL
  Filled 2019-07-28: qty 1

## 2019-07-28 MED ORDER — VITAMIN B-12 1000 MCG PO TABS
500.0000 ug | ORAL_TABLET | Freq: Every day | ORAL | Status: DC
  Administered 2019-07-28 – 2019-08-06 (×10): 500 ug via ORAL
  Filled 2019-07-28 (×11): qty 1

## 2019-07-28 NOTE — Progress Notes (Signed)
Subjective: 2 Days Post-Op Procedure(s) (LRB): OPEN REDUCTION INTERNAL FIXATION (ORIF) RIGHT FEMUR PERIPROSTHETIC FRACTURE (Right) Patient reports pain as moderate.  Objective: Vital signs in last 24 hours: Temp:  [98.4 F (36.9 C)-99.5 F (37.5 C)] 99.1 F (37.3 C) (09/07 0737) Pulse Rate:  [82-93] 82 (09/07 0737) Resp:  [14-20] 16 (09/07 0737) BP: (142-159)/(56-69) 159/64 (09/07 0737) SpO2:  [95 %-99 %] 97 % (09/07 0737) Weight:  [92.2 kg] 92.2 kg (09/07 0500)  Intake/Output from previous day: 09/06 0701 - 09/07 0700 In: 228 [P.O.:228] Out: 1400 [Urine:1400] Intake/Output this shift: Total I/O In: 240 [P.O.:240] Out: 400 [Urine:400]  Recent Labs    07/26/19 0401 07/26/19 1012 07/27/19 0727 07/27/19 1812 07/28/19 0430  HGB 10.7* 10.2* 6.8* 7.9* 8.3*   Recent Labs    07/27/19 0727 07/27/19 1812 07/28/19 0430  WBC 12.1*  --  10.8*  RBC 2.31*  --  2.83*  HCT 20.4* 23.2* 24.5*  PLT 177  --  168   Recent Labs    07/27/19 0727 07/28/19 0430  NA 135 132*  K 3.8 3.5  CL 100 98  CO2 25 24  BUN 25* 19  CREATININE 0.75 0.63  GLUCOSE 144* 123*  CALCIUM 8.5* 8.2*   No results for input(s): LABPT, INR in the last 72 hours.  Sensation intact distally Intact pulses distally Dorsiflexion/Plantar flexion intact Incision: dressing C/D/I and no drainage Compartment soft   Assessment/Plan: 2 Days Post-Op Procedure(s) (LRB): OPEN REDUCTION INTERNAL FIXATION (ORIF) RIGHT FEMUR PERIPROSTHETIC FRACTURE (Right) See yesterday note no change,  Doing well on rounds. I spoke with patients daughter and answered questions.     Andrea [name] [name] 07/28/2019, 11:07 AM

## 2019-07-28 NOTE — Evaluation (Signed)
Physical Therapy Evaluation Patient Details Name: Andrea [name] MRN: [id number] DOB: Mar 24, 1935 Today's Date: 07/28/2019   History of Present Illness  Andrea [name] is a 84 y.o. female with history of A. fib on Eliquis, remote bilateral TKA, left breast cancer status post lumpectomy and radiation now on chemo, OSA on CPAP, hypothyroidism, overactive bladder, depression/anxiety and DM-2 presenting with mechanical fall at home resulting in distal femur periprosthetic fx, no s/p ORIF, TWB, unlimited ROM knee  Clinical Impression  Pt presents with an overall decrease in functional mobility secondary to above. PTA, pt required assistance for ADLs and stair negotiation, ambulated with RW throughout her home with supervision from daughter. Educ on precautions, positioning, therex, and importance of mobility. Today, pt able to perform bed mobility with mod assist and stand using the RW and max assist, unable to perform bed<>chair transfer this session secondary to fatigue, weakness and pain. In standing pt with increased work on breathing and increased posterior lean limiting ability to perform transfer. Pt would benefit from continued acute PT services to maximize functional mobility and independence prior to d/c to SNF for follow up therapy.      Follow Up Recommendations SNF    Equipment Recommendations  (tbd next venue)    Recommendations for Other Services OT consult     Precautions / Restrictions Precautions Precautions: Fall Restrictions Weight Bearing Restrictions: Yes RLE Weight Bearing: Touchdown weight bearing      Mobility  Bed Mobility Overal bed mobility: Needs Assistance Bed Mobility: Supine to Sit;Sit to Supine     Supine to sit: Min assist;HOB elevated Sit to supine: Mod assist   General bed mobility comments: min assist for supine>sit with heavy reliance on use of bed rails and therapist assisting with R LE management, mod assist for sit>supine for B LE  management  Transfers Overall transfer level: Needs assistance Equipment used: Rolling walker (2 wheeled) Transfers: Sit to/from Stand Sit to Stand: Max assist         General transfer comment: Pt performed x 2 sit<>stands throughout this session max assist from elevated EOB with use of RW for UE support. Pt performed x 1 sit<>stands from elevated EOB without AD and therapist in front of patient to facilitate increased hip extension, max assist. Pt unable to perform stand pivot or squat pivot transfer to recliner this session secondary to pain and weakness.  Ambulation/Gait                Stairs            Wheelchair Mobility    Modified Rankin (Stroke Patients Only)       Balance Overall balance assessment: Needs assistance Sitting-balance support: Feet supported;Bilateral upper extremity supported Sitting balance-[name]: Poor Sitting balance - Comments: posterior lean in sitting requiring UE support using bedrails to maintain balance or external support from therapist- min assist Postural control: Posterior lean Standing balance support: Bilateral upper extremity supported Standing balance-[name]: Poor Standing balance comment: pt with strong posterior lean in standing with RW requiring max external assist from therapist to maintain standing balance                             Pertinent Vitals/Pain Pain Assessment: Faces Faces Pain Scale: Hurts little more Pain Location: R LE Pain Descriptors / Indicators: Aching;Grimacing;Guarding Pain Intervention(s): Limited activity within patient's tolerance;Monitored during session;Repositioned    Home Living Family/patient expects to be discharged to:: Private residence  Living Arrangements: Children(lives with daughter) Available Help at Discharge: Family;Personal care attendant(hired caregiver M-F from 8am-2pm) Type of Home: House Home Access: Stairs to enter Entrance Stairs-Rails:  Chemical engineer of Steps: 5 STE Home Layout: One level Home Equipment: Walker - 2 wheels;Bedside commode;Shower seat Additional Comments: per pt report    Prior Function Level of Independence: Needs assistance   Gait / Transfers Assistance Needed: pt ambulated Mod I with RW, assistance for stair negotiation  ADL's / Homemaking Assistance Needed: assistance for bathing/dressing from hired caregiver        Hand Dominance   Dominant Hand: Right    Extremity/Trunk Assessment   Upper Extremity Assessment Upper Extremity Assessment: Overall WFL for tasks assessed    Lower Extremity Assessment Lower Extremity Assessment: RLE deficits/detail RLE Deficits / Details: R LE PROM knee flexion to 30 degrees (limited secondary to pain), pt achieves full knee extension PROM. Strength and ROM WFL at the hip and ankle RLE: Unable to fully assess due to pain    Cervical / Trunk Assessment Cervical / Trunk Assessment: Kyphotic  Communication   Communication: No difficulties  Cognition Arousal/Alertness: Awake/alert Behavior During Therapy: WFL for tasks assessed/performed Overall Cognitive Status: Within Functional Limits for tasks assessed                                 General Comments: Pt noted to have short term memory deficits, pt reports recent trouble with memory at home      General Comments      Exercises     Assessment/Plan    PT Assessment Patient needs continued PT services  PT Problem List Decreased strength;Decreased knowledge of use of DME;Decreased activity tolerance;Decreased balance;Pain;Decreased mobility       PT Treatment Interventions DME instruction;Balance training;Gait training;Stair training;Functional mobility training;Patient/family education;Therapeutic activities;Therapeutic exercise;Wheelchair mobility training    PT Goals (Current goals can be found in the Care Plan section)  Acute Rehab PT Goals Patient Stated  Goal: "to go back to my daughter's house" PT Goal Formulation: With patient Time For Goal Achievement: 08/11/19 Potential to Achieve Goals: Fair    Frequency Min 3X/week   Barriers to discharge Inaccessible home environment pt reports 5 STE with rails    Co-evaluation               AM-PAC PT "6 Clicks" Mobility  Outcome Measure Help needed turning from your back to your side while in a flat bed without using bedrails?: A Little Help needed moving from lying on your back to sitting on the side of a flat bed without using bedrails?: A Lot Help needed moving to and from a bed to a chair (including a wheelchair)?: Total Help needed standing up from a chair using your arms (e.g., wheelchair or bedside chair)?: A Lot Help needed to walk in hospital room?: Total Help needed climbing 3-5 steps with a railing? : Total 6 Click Score: 10    End of Session Equipment Utilized During Treatment: Gait belt Activity Tolerance: Patient limited by fatigue;Patient limited by pain Patient left: in bed;with bed alarm set;with call bell/phone within reach Nurse Communication: Mobility status PT Visit Diagnosis: Other abnormalities of gait and mobility (R26.89);Pain;Unsteadiness on feet (R26.81) Pain - Right/Left: Right Pain - part of body: Leg    Time: [id number] PT Time Calculation (min) (ACUTE ONLY): 29 min   Charges:   PT Evaluation $PT Eval Moderate Complexity: 1 Mod PT Treatments $  Therapeutic Activity: 8-22 mins        Netta Corrigan, PT, DPT Acute Rehab Office [place] 07/28/2019, 10:25 AM

## 2019-07-28 NOTE — Progress Notes (Signed)
PROGRESS NOTE  Andrea [name] [id number] DOB: 1935-05-27 DOA: 07/25/2019 PCP: Gayland Curry, DO   LOS: 3 days   Brief Narrative / Interim history: 84 year old female with history of A. fib on Eliquis, remote bilateral TKA, left breast cancer status post lumpectomy and radiation now on chemo, OSA on CPAP, hypothyroidism, depression/anxiety, type 2 diabetes mellitus, who presents to the hospital after having a fall at home and pain in her right leg.  Imaging on admission revealed acute displaced spiral fracture of the distal femoral metadiaphysis.  Orthopedic surgery was consulted and patient will be taken to the OR on 9/5  Subjective: -No complaints this morning.  Denies any chest pain, denies any abdominal pain, no nausea or vomiting.  Denies any hip pain  Assessment & Plan: Active Problems:   Right femoral fracture (HCC)   Principal Problem Periprosthetic displaced right distal femoral fracture -Orthopedic surgery consulted, appreciate input, she is status post operative repair with ORIF right femur, wound VAC in place, touchdown weightbearing on the right lower extremity.  Eliquis to begin at 2.5 mg twice daily on 9/6, and after 48-72 hours of full home dose to be resumed.  Praveena wound VAC on discharge -PT evaluated patient and recommended SNF, patient somewhat reticent.  Consulted Education officer, museum  Active Problems Paroxysmal A. fib, currently in sinus rhythm -Continue Eliquis -She does not appear to be on beta-blockers or calcium channel blockers at home  Acute blood loss anemia -Down to 6.8 on 07/27/2019, status post 1 unit of packed red blood cells, hemoglobin improved appropriately and will continue to monitor.  No bleeding  Hypothyroidism -Continue Synthroid  OSA on CPAP -Continue nightly CPAP  Left breast cancer status post lumpectomy and radiation, now on chemotherapy -Followed by Dr. Lindi Adie as an outpatient  Tobacco use -Nicotine patch  Anxiety/depression  -Continue home medications   Scheduled Meds: . sodium chloride   Intravenous Once  . apixaban  2.5 mg Oral BID  . docusate sodium  100 mg Oral BID  . insulin aspart  0-5 Units Subcutaneous QHS  . insulin aspart  0-9 Units Subcutaneous TID WC  . nicotine  7 mg Transdermal Daily   Continuous Infusions:  PRN Meds:.bisacodyl, menthol-cetylpyridinium **OR** phenol, metoCLOPramide **OR** metoCLOPramide (REGLAN) injection, morphine injection, ondansetron **OR** ondansetron (ZOFRAN) IV, oxyCODONE, senna-docusate, sodium phosphate, zolpidem  DVT prophylaxis: She is on Eliquis Code Status: DNR Family Communication: no family at bedside  Disposition Plan: TBD, SNF placement pending  Consultants:   Orthopedic surgery   Procedures:   ORIF right femur  Antimicrobials:  None    Objective: Vitals:   07/27/19 2140 07/28/19 0500 07/28/19 0617 07/28/19 0737  BP: (!) 146/66  (!) 151/67 (!) 159/64  Pulse: 88  87 82  Resp:   18 16  Temp: 98.4 F (36.9 C)  98.4 F (36.9 C) 99.1 F (37.3 C)  TempSrc: Oral  Oral Oral  SpO2: 96%  96% 97%  Weight:  92.2 kg    Height:        Intake/Output Summary (Last 24 hours) at 07/28/2019 1117 Last data filed at 07/28/2019 0900 Gross per 24 hour  Intake 354 ml  Output 1800 ml  Net -1446 ml   Filed Weights   07/26/19 0259 07/27/19 0500 07/28/19 0500  Weight: 92.2 kg 92 kg 92.2 kg    Examination:  Constitutional: No distress Eyes: No scleral icterus ENMT: Moist mucous membranes Respiratory: Lungs are clear bilaterally, no wheezing, no crackles, good respiratory effort Cardiovascular: Regular rate and  rhythm, no murmurs appreciated.  No edema, good pulses Abdomen: Soft, NT, ND, positive bowel sounds Musculoskeletal: no clubbing / cyanosis. Skin: No rashes  Data Reviewed: I have independently reviewed following labs and imaging studies   CBC: Recent Labs  Lab 07/25/19 0734 07/26/19 0401 07/26/19 1012 07/27/19 0727 07/27/19 1812  07/28/19 0430  WBC 12.2* 8.4  --  12.1*  --  10.8*  NEUTROABS 10.8*  --   --   --   --   --   HGB 11.9* 10.7* 10.2* 6.8* 7.9* 8.3*  HCT 37.2 31.9* 30.0* 20.4* 23.2* 24.5*  MCV 91.2 88.4  --  88.3  --  86.6  PLT 197 214  --  177  --  [id number]   Basic Metabolic Panel: Recent Labs  Lab 07/25/19 0734 07/26/19 0401 07/26/19 1012 07/27/19 0727 07/28/19 0430  NA 138 136 136 135 132*  K 4.0 4.3 4.4 3.8 3.5  CL 102 102  --  100 98  CO2 27 25  --  25 24  GLUCOSE 144* 167* 165* 144* 123*  BUN 26* 24*  --  25* 19  CREATININE 0.79 0.74  --  0.75 0.63  CALCIUM 9.3 8.9  --  8.5* 8.2*   GFR: Estimated Creatinine Clearance: 56.1 mL/min (by C-G formula based on SCr of 0.63 mg/dL). Liver Function Tests: Recent Labs  Lab 07/25/19 0734  AST 17  ALT 13  ALKPHOS 58  BILITOT 0.6  PROT 6.2*  ALBUMIN 3.4*   No results for input(s): LIPASE, AMYLASE in the last 168 hours. No results for input(s): AMMONIA in the last 168 hours. Coagulation Profile: Recent Labs  Lab 07/25/19 0734  INR 1.2   Cardiac Enzymes: No results for input(s): CKTOTAL, CKMB, CKMBINDEX, TROPONINI in the last 168 hours. BNP (last 3 results) No results for input(s): PROBNP in the last 8760 hours. HbA1C: No results for input(s): HGBA1C in the last 72 hours. CBG: Recent Labs  Lab 07/27/19 0618 07/27/19 1300 07/27/19 1753 07/27/19 2148 07/28/19 0620  GLUCAP 136* 142* 126* 131* 132*   Lipid Profile: No results for input(s): CHOL, HDL, LDLCALC, TRIG, CHOLHDL, LDLDIRECT in the last 72 hours. Thyroid Function Tests: No results for input(s): TSH, T4TOTAL, FREET4, T3FREE, THYROIDAB in the last 72 hours. Anemia Panel: No results for input(s): VITAMINB12, FOLATE, FERRITIN, TIBC, IRON, RETICCTPCT in the last 72 hours. Urine analysis:    Component Value Date/Time   COLORURINE YELLOW 07/11/2018 1020   APPEARANCEUR CLEAR 07/11/2018 1020   APPEARANCEUR Cloudy (A) 09/22/2014 1015   LABSPEC 1.012 07/11/2018 1020   PHURINE  6.5 07/11/2018 1020   GLUCOSEU NEGATIVE 07/11/2018 1020   HGBUR NEGATIVE 07/11/2018 1020   BILIRUBINUR neg 09/06/2018 1110   BILIRUBINUR Negative 09/22/2014 1015   KETONESUR NEGATIVE 07/11/2018 1020   PROTEINUR Negative 09/06/2018 1110   PROTEINUR NEGATIVE 07/11/2018 1020   UROBILINOGEN 0.2 09/06/2018 1110   UROBILINOGEN 0.2 03/11/2013 2003   NITRITE neg 09/06/2018 1110   NITRITE NEGATIVE 07/11/2018 1020   LEUKOCYTESUR Negative 09/06/2018 1110   LEUKOCYTESUR 2+ (A) 09/22/2014 1015   Sepsis Labs: Invalid input(s): PROCALCITONIN, LACTICIDVEN  Recent Results (from the past 240 hour(s))  SARS Coronavirus 2 [facility] order, Performed in [facility] hospital lab) Nasopharyngeal Nasopharyngeal Swab     Status: None   Collection Time: 07/25/19  7:39 AM   Specimen: Nasopharyngeal Swab  Result Value Ref Range Status   SARS Coronavirus 2 NEGATIVE NEGATIVE Final    Comment: (NOTE) If result is NEGATIVE SARS-CoV-2 target  nucleic acids are NOT DETECTED. The SARS-CoV-2 RNA is generally detectable in upper and lower  respiratory specimens during the acute phase of infection. The lowest  concentration of SARS-CoV-2 viral copies this assay can detect is 250  copies / mL. A negative result does not preclude SARS-CoV-2 infection  and should not be used as the sole basis for treatment or other  patient management decisions.  A negative result may occur with  improper specimen collection / handling, submission of specimen other  than nasopharyngeal swab, presence of viral mutation(s) within the  areas targeted by this assay, and inadequate number of viral copies  (<250 copies / mL). A negative result must be combined with clinical  observations, patient history, and epidemiological information. If result is POSITIVE SARS-CoV-2 target nucleic acids are DETECTED. The SARS-CoV-2 RNA is generally detectable in upper and lower  respiratory specimens dur ing the acute phase of infection.  Positive   results are indicative of active infection with SARS-CoV-2.  Clinical  correlation with patient history and other diagnostic information is  necessary to determine patient infection status.  Positive results do  not rule out bacterial infection or co-infection with other viruses. If result is PRESUMPTIVE POSTIVE SARS-CoV-2 nucleic acids MAY BE PRESENT.   A presumptive positive result was obtained on the submitted specimen  and confirmed on repeat testing.  While 2019 novel coronavirus  (SARS-CoV-2) nucleic acids may be present in the submitted sample  additional confirmatory testing may be necessary for epidemiological  and / or clinical management purposes  to differentiate between  SARS-CoV-2 and other Sarbecovirus currently known to infect humans.  If clinically indicated additional testing with an alternate test  methodology [phone]) is advised. The SARS-CoV-2 RNA is generally  detectable in upper and lower respiratory sp ecimens during the acute  phase of infection. The expected result is Negative. Fact Sheet for Patients:  StrictlyIdeas.no Fact Sheet for Healthcare Providers: BankingDealers.co.za This test is not yet approved or cleared by the [place] FDA and has been authorized for detection and/or diagnosis of SARS-CoV-2 by FDA under an Emergency Use Authorization (EUA).  This EUA will remain in effect (meaning this test can be used) for the duration of the COVID-19 declaration under Section 564(b)(1) of the Act, 21 U.S.C. section 360bbb-3(b)(1), unless the authorization is terminated or revoked sooner. Performed at [facility], [place] [address], [place], [place] [address]   Surgical PCR screen     Status: None   Collection Time: 07/25/19  5:34 PM   Specimen: Nasal Mucosa; Nasal Swab  Result Value Ref Range Status   MRSA, PCR NEGATIVE NEGATIVE Final   Staphylococcus aureus NEGATIVE NEGATIVE Final     Comment: (NOTE) The Xpert SA Assay (FDA approved for NASAL specimens in patients 33 years of age and older), is one component of a comprehensive surveillance program. It is not intended to diagnose infection nor to guide or monitor treatment. Performed at [facility], [place] [address], [place], [place] [address]   SARS CORONAVIRUS 2 (TAT 6-24 HRS) Nasopharyngeal Nasopharyngeal Swab     Status: None   Collection Time: 07/25/19  6:23 PM   Specimen: Nasopharyngeal Swab  Result Value Ref Range Status   SARS Coronavirus 2 NEGATIVE NEGATIVE Final    Comment: (NOTE) SARS-CoV-2 target nucleic acids are NOT DETECTED. The SARS-CoV-2 RNA is generally detectable in upper and lower respiratory specimens during the acute phase of infection. Negative results do not preclude SARS-CoV-2 infection, do not rule  out co-infections with other pathogens, and should not be used as the sole basis for treatment or other patient management decisions. Negative results must be combined with clinical observations, patient history, and epidemiological information. The expected result is Negative. Fact Sheet for Patients: SugarRoll.be Fact Sheet for Healthcare Providers: https://www.woods-mathews.com/ This test is not yet approved or cleared by the [place] FDA and  has been authorized for detection and/or diagnosis of SARS-CoV-2 by FDA under an Emergency Use Authorization (EUA). This EUA will remain  in effect (meaning this test can be used) for the duration of the COVID-19 declaration under Section 56 4(b)(1) of the Act, 21 U.S.C. section 360bbb-3(b)(1), unless the authorization is terminated or revoked sooner. Performed at [facility], [place] [address] [place], [place] [address]       Radiology Studies: Dg Femur [place], [place] 2 Views Right  Result Date: 07/26/2019 CLINICAL DATA:  Status post ORIF of femur fracture. EXAM: RIGHT FEMUR PORTABLE 2 VIEW  COMPARISON:  07/25/2019. FINDINGS: Interval open reduction and internal fixation of distal femur fracture. Screw and plate device with cerclage wires are identified and appear to be in anatomic alignment. No complicating features identified. Previous right knee arthroplasty. IMPRESSION: 1. Status post ORIF of distal femur fracture. Electronically Signed   By: Kerby Moors M.D.   On: 07/26/2019 13:14    Marzetta Board, MD, PhD [facility]  Contact via  [email or web address]  [place] P: [phone] F: [phone]

## 2019-07-28 NOTE — Plan of Care (Signed)
  Problem: Activity: Goal: Risk for activity intolerance will decrease Outcome: Progressing   Problem: Coping: Goal: Level of anxiety will decrease Outcome: Progressing   Problem: Pain Managment: Goal: General experience of comfort will improve Outcome: Progressing   Problem: Safety: Goal: Ability to remain free from injury will improve Outcome: Progressing   

## 2019-07-28 NOTE — Progress Notes (Signed)
Patient refused CPAP tonight 

## 2019-07-28 NOTE — Progress Notes (Signed)
Initial Nutrition Assessment  RD working remotely.  DOCUMENTATION CODES:   Obesity unspecified  INTERVENTION:   - Ensure Max po BID, each supplement provides 150 kcal and 30 grams of protein  - MVI with minerals daily  NUTRITION DIAGNOSIS:   Increased nutrient needs related to post-op healing, wound healing as evidenced by estimated needs.  GOAL:   Patient will meet greater than or equal to 90% of their needs  MONITOR:   PO intake, Supplement acceptance, Labs, Weight trends, Skin, I & O's  REASON FOR ASSESSMENT:   Consult Wound healing, Hip fracture protocol  ASSESSMENT:   84 year old female who presented to the ED on 9/04 with c/o right knee pain after falling. PMH of DVT, atrial fibrillation, breast cancer s/p surgery and XRT currently receiving chemotherapy, T2DM. Pt found to have right periprosthetic distal femur fracture.   9/05 - s/p ORIF right femur periprosthetic fracture, VAC placement  Therapies recommending SNF.  Per RN edema assessment, pt with nonpitting edema to RLE.  Unable to reach pt via phone call to room. RD will order an oral nutrition supplement to aid pt in meeting kcal and protein needs. Will also order daily MVI to aid in wound healing.  Meal Completion: 0-100% x last 5 recorded meals  Medications reviewed and include: Colace, SSI  Labs reviewed: sodium 132, hemoglobin 8.3 CBG's: 121-142 x 24 hours  UOP: 1400 ml x 24 hours  NUTRITION - FOCUSED PHYSICAL EXAM:  Unable to complete at this time. RD working remotely.  Diet Order:   Diet Order            Diet Carb Modified Fluid consistency: Thin; Room service appropriate? Yes  Diet effective now              EDUCATION NEEDS:   No education needs have been identified at this time  Skin:  Skin Assessment: Skin Integrity Issues: Skin Integrity Issues: Wound Vac: right leg  Last BM:  07/24/19  Height:   Ht Readings from Last 1 Encounters:  07/25/19 5\' 1"  (1.549 m)     Weight:   Wt Readings from Last 1 Encounters:  07/28/19 92.2 kg    Ideal Body Weight:  47.7 kg  BMI:  Body mass index is 38.41 kg/m.  Estimated Nutritional Needs:   Kcal:  1500-1700  Protein:  85-100 grams  Fluid:  1.5-1.7 L    Gaynell Face, MS, RD, LDN Inpatient Clinical Dietitian Pager: [phone] Weekend/After Hours: [phone]

## 2019-07-28 NOTE — Care Management Important Message (Signed)
Important Message  Patient Details  Name: Andrea [name] MRN: [id number] Date of Birth: 1935-08-26   Medicare Important Message Given:  Yes     Memory Argue 07/28/2019, 3:13 PM

## 2019-07-29 ENCOUNTER — Inpatient Hospital Stay (HOSPITAL_COMMUNITY): Payer: Medicare Other

## 2019-07-29 ENCOUNTER — Encounter (HOSPITAL_COMMUNITY): Payer: Self-pay | Admitting: Orthopedic Surgery

## 2019-07-29 LAB — URINALYSIS, ROUTINE W REFLEX MICROSCOPIC
Bacteria, UA: NONE SEEN
Bilirubin Urine: NEGATIVE
Glucose, UA: NEGATIVE mg/dL
Ketones, ur: 20 mg/dL — AB
Leukocytes,Ua: NEGATIVE
Nitrite: NEGATIVE
Protein, ur: NEGATIVE mg/dL
Specific Gravity, Urine: 1.015 (ref 1.005–1.030)
pH: 6 (ref 5.0–8.0)

## 2019-07-29 LAB — BLOOD GAS, ARTERIAL
Acid-Base Excess: 3.8 mmol/L — ABNORMAL HIGH (ref 0.0–2.0)
Bicarbonate: 26.8 mmol/L (ref 20.0–28.0)
Drawn by: 39899
FIO2: 21
O2 Saturation: 97.3 %
Patient temperature: 99.8
pCO2 arterial: 34.9 mmHg (ref 32.0–48.0)
pH, Arterial: 7.501 — ABNORMAL HIGH (ref 7.350–7.450)
pO2, Arterial: 83.1 mmHg (ref 83.0–108.0)

## 2019-07-29 LAB — BASIC METABOLIC PANEL
Anion gap: 9 (ref 5–15)
BUN: 17 mg/dL (ref 8–23)
CO2: 25 mmol/L (ref 22–32)
Calcium: 8.3 mg/dL — ABNORMAL LOW (ref 8.9–10.3)
Chloride: 97 mmol/L — ABNORMAL LOW (ref 98–111)
Creatinine, Ser: 0.6 mg/dL (ref 0.44–1.00)
GFR calc Af Amer: >60 mL/min (ref >60)
GFR calc non Af Amer: >60 mL/min (ref >60)
Glucose, Bld: 135 mg/dL — ABNORMAL HIGH (ref 70–99)
Potassium: 3.3 mmol/L — ABNORMAL LOW (ref 3.5–5.1)
Sodium: 131 mmol/L — ABNORMAL LOW (ref 135–145)

## 2019-07-29 LAB — GLUCOSE, CAPILLARY
Glucose-Capillary: 133 mg/dL — ABNORMAL HIGH (ref 70–99)
Glucose-Capillary: 119 mg/dL — ABNORMAL HIGH (ref 70–99)
Glucose-Capillary: 131 mg/dL — ABNORMAL HIGH (ref 70–99)
Glucose-Capillary: 133 mg/dL — ABNORMAL HIGH (ref 70–99)
Glucose-Capillary: 113 mg/dL — ABNORMAL HIGH (ref 70–99)

## 2019-07-29 LAB — CBC
HCT: 24.8 % — ABNORMAL LOW (ref 36.0–46.0)
Hemoglobin: 8.5 g/dL — ABNORMAL LOW (ref 12.0–15.0)
MCH: 29.2 pg (ref 26.0–34.0)
MCHC: 34.3 g/dL (ref 30.0–36.0)
MCV: 85.2 fL (ref 80.0–100.0)
Platelets: 171 10*3/uL (ref 150–400)
RBC: 2.91 MIL/uL — ABNORMAL LOW (ref 3.87–5.11)
RDW: 14.4 % (ref 11.5–15.5)
WBC: 11 10*3/uL — ABNORMAL HIGH (ref 4.0–10.5)
nRBC: 0 % (ref 0.0–0.2)

## 2019-07-29 MED ORDER — OXYCODONE HCL 5 MG PO TABS: 5.0000 mg | ORAL_TABLET | ORAL | 0 refills | Status: DC | PRN

## 2019-07-29 MED ORDER — POTASSIUM CHLORIDE CRYS ER 20 MEQ PO TBCR
40.0000 meq | EXTENDED_RELEASE_TABLET | Freq: Once | ORAL | Status: AC
  Administered 2019-07-29: 40 meq via ORAL
  Filled 2019-07-29: qty 2

## 2019-07-29 NOTE — Progress Notes (Signed)
PROGRESS NOTE  Andrea [name] [id number] DOB: Nov 12, 1935 DOA: 07/25/2019 PCP: Gayland Curry, DO   LOS: 4 days   Brief Narrative / Interim history: 84 year old female with history of A. fib on Eliquis, remote bilateral TKA, left breast cancer status post lumpectomy and radiation now on chemo, OSA on CPAP, hypothyroidism, depression/anxiety, type 2 diabetes mellitus, who presents to the hospital after having a fall at home and pain in her right leg.  Imaging on admission revealed acute displaced spiral fracture of the distal femoral metadiaphysis.  Orthopedic surgery was consulted and patient will be taken to the OR on 9/5  Subjective: -No complaints this morning but patient extremely drowsy and noninteractive  Assessment & Plan: Active Problems:   Right femoral fracture (HCC)   Principal Problem Periprosthetic displaced right distal femoral fracture -Orthopedic surgery consulted, appreciate input, she is status post operative repair with ORIF right femur, wound VAC in place, touchdown weightbearing on the right lower extremity.  Eliquis to begin at 2.5 mg twice daily on 9/6, and after 72 hours of full home dose to be resumed on 9/9.  Praveena wound VAC on discharge -PT recommending SNF  Active Problems Acute metabolic encephalopathy/drowsiness -Noticed on 9/8, patient significantly less interactive.  She has no focal deficits, is able to wake up when called and does follow commands, she tells me her name however drifts back asleep.  MAR does not show any pain medications recently.  Caregiver is at bedside and states that this is not her baseline she is normally wide-awake, alert, interactive.  Denies any prior history of dementia.  Given history of OSA will obtain a ABG to rule out hypercarbia, obtain a CT scan of the brain to evaluate for CVA given the fact that she has A. fib and has been off Eliquis perioperatively, send urinalysis to evaluate for infection.  Meanwhile continue to  closely monitor.  Will place on telemetry.  Vital signs are stable and blood work this morning was fairly unremarkable.  Hold BuSpar, brexpiprazole, Cymbalta  Paroxysmal A. fib, currently in sinus rhythm -Continue Eliquis -She does not appear to be on beta-blockers or calcium channel blockers at home  Acute blood loss anemia -Down to 6.8 on 07/27/2019, status post 1 unit of packed red blood cells, hemoglobin improved appropriately and will continue to monitor.  No bleeding  Hypothyroidism -Continue Synthroid  OSA on CPAP -Continue nightly CPAP  Left breast cancer status post lumpectomy and radiation, now on chemotherapy -Followed by Dr. Lindi Adie as an outpatient  Tobacco use -Nicotine patch  Anxiety/depression -Continue home medications   Scheduled Meds: . sodium chloride   Intravenous Once  . apixaban  2.5 mg Oral BID  . Brexpiprazole  1 mg Oral QHS  . busPIRone  10 mg Oral Daily  . busPIRone  5 mg Oral Q supper  . docusate sodium  100 mg Oral BID  . DULoxetine  120 mg Oral Daily  . insulin aspart  0-5 Units Subcutaneous QHS  . insulin aspart  0-9 Units Subcutaneous TID WC  . levothyroxine  50 mcg Oral Q0600  . mirtazapine  7.5 mg Oral QHS  . nicotine  7 mg Transdermal Daily  . oxybutynin  10 mg Oral Daily  . vitamin B-12  500 mcg Oral Daily   Continuous Infusions:  PRN Meds:.bisacodyl, menthol-cetylpyridinium **OR** phenol, metoCLOPramide **OR** metoCLOPramide (REGLAN) injection, morphine injection, ondansetron **OR** ondansetron (ZOFRAN) IV, oxyCODONE, senna-docusate, sodium phosphate, zolpidem  DVT prophylaxis: She is on Eliquis Code Status: DNR Family  Communication: Caregiver at the bedside Disposition Plan: TBD, SNF placement pending  Consultants:   Orthopedic surgery   Procedures:   ORIF right femur  Antimicrobials:  None    Objective: Vitals:   07/29/19 0254 07/29/19 0500 07/29/19 0818 07/29/19 1228  BP: (!) 148/76  (!) 141/62 (!) 148/63  Pulse:  82  79 79  Resp: 16  16 18   Temp: 98.2 F (36.8 C)  98.3 F (36.8 C) 99.1 F (37.3 C)  TempSrc: Oral  Oral Oral  SpO2: 100%  98% 98%  Weight:  84.6 kg    Height:        Intake/Output Summary (Last 24 hours) at 07/29/2019 1324 Last data filed at 07/29/2019 0951 Gross per 24 hour  Intake 480 ml  Output 1850 ml  Net -1370 ml   Filed Weights   07/27/19 0500 07/28/19 0500 07/29/19 0500  Weight: 92 kg 92.2 kg 84.6 kg    Examination:  Constitutional: NAD however very drowsy Eyes: No icterus ENMT: Moist mucous membranes Respiratory: Lungs are clear to auscultation bilaterally, no wheezing, no crackles Cardiovascular: Regular rate and rhythm, no murmurs.  No peripheral edema Abdomen: Soft, nontender, nondistended, positive bowel sounds Musculoskeletal: No clubbing or cyanosis Skin: No rashes seen  Data Reviewed: I have independently reviewed following labs and imaging studies   CBC: Recent Labs  Lab 07/25/19 0734 07/26/19 0401 07/26/19 1012 07/27/19 0727 07/27/19 1812 07/28/19 0430 07/29/19 0348  WBC 12.2* 8.4  --  12.1*  --  10.8* 11.0*  NEUTROABS 10.8*  --   --   --   --   --   --   HGB 11.9* 10.7* 10.2* 6.8* 7.9* 8.3* 8.5*  HCT 37.2 31.9* 30.0* 20.4* 23.2* 24.5* 24.8*  MCV 91.2 88.4  --  88.3  --  86.6 85.2  PLT 197 214  --  177  --  168 [id number]   Basic Metabolic Panel: Recent Labs  Lab 07/25/19 0734 07/26/19 0401 07/26/19 1012 07/27/19 0727 07/28/19 0430 07/29/19 0348  NA 138 136 136 135 132* 131*  K 4.0 4.3 4.4 3.8 3.5 3.3*  CL 102 102  --  100 98 97*  CO2 27 25  --  25 24 25   GLUCOSE 144* 167* 165* 144* 123* 135*  BUN 26* 24*  --  25* 19 17  CREATININE 0.79 0.74  --  0.75 0.63 0.60  CALCIUM 9.3 8.9  --  8.5* 8.2* 8.3*   GFR: Estimated Creatinine Clearance: 53.5 mL/min (by C-G formula based on SCr of 0.6 mg/dL). Liver Function Tests: Recent Labs  Lab 07/25/19 0734  AST 17  ALT 13  ALKPHOS 58  BILITOT 0.6  PROT 6.2*  ALBUMIN 3.4*   No results  for input(s): LIPASE, AMYLASE in the last 168 hours. No results for input(s): AMMONIA in the last 168 hours. Coagulation Profile: Recent Labs  Lab 07/25/19 0734  INR 1.2   Cardiac Enzymes: No results for input(s): CKTOTAL, CKMB, CKMBINDEX, TROPONINI in the last 168 hours. BNP (last 3 results) No results for input(s): PROBNP in the last 8760 hours. HbA1C: No results for input(s): HGBA1C in the last 72 hours. CBG: Recent Labs  Lab 07/28/19 1126 07/28/19 1650 07/28/19 2041 07/29/19 0637 07/29/19 1125  GLUCAP 121* 117* 141* 119* 131*   Lipid Profile: No results for input(s): CHOL, HDL, LDLCALC, TRIG, CHOLHDL, LDLDIRECT in the last 72 hours. Thyroid Function Tests: No results for input(s): TSH, T4TOTAL, FREET4, T3FREE, THYROIDAB in the last 72 hours. Anemia  Panel: No results for input(s): VITAMINB12, FOLATE, FERRITIN, TIBC, IRON, RETICCTPCT in the last 72 hours. Urine analysis:    Component Value Date/Time   COLORURINE YELLOW 07/11/2018 1020   APPEARANCEUR CLEAR 07/11/2018 1020   APPEARANCEUR Cloudy (A) 09/22/2014 1015   LABSPEC 1.012 07/11/2018 1020   PHURINE 6.5 07/11/2018 1020   GLUCOSEU NEGATIVE 07/11/2018 1020   HGBUR NEGATIVE 07/11/2018 1020   BILIRUBINUR neg 09/06/2018 1110   BILIRUBINUR Negative 09/22/2014 1015   KETONESUR NEGATIVE 07/11/2018 1020   PROTEINUR Negative 09/06/2018 1110   PROTEINUR NEGATIVE 07/11/2018 1020   UROBILINOGEN 0.2 09/06/2018 1110   UROBILINOGEN 0.2 03/11/2013 2003   NITRITE neg 09/06/2018 1110   NITRITE NEGATIVE 07/11/2018 1020   LEUKOCYTESUR Negative 09/06/2018 1110   LEUKOCYTESUR 2+ (A) 09/22/2014 1015   Sepsis Labs: Invalid input(s): PROCALCITONIN, LACTICIDVEN  Recent Results (from the past 240 hour(s))  SARS Coronavirus 2 [facility] order, Performed in [facility] hospital lab) Nasopharyngeal Nasopharyngeal Swab     Status: None   Collection Time: 07/25/19  7:39 AM   Specimen: Nasopharyngeal Swab  Result Value Ref Range Status    SARS Coronavirus 2 NEGATIVE NEGATIVE Final    Comment: (NOTE) If result is NEGATIVE SARS-CoV-2 target nucleic acids are NOT DETECTED. The SARS-CoV-2 RNA is generally detectable in upper and lower  respiratory specimens during the acute phase of infection. The lowest  concentration of SARS-CoV-2 viral copies this assay can detect is 250  copies / mL. A negative result does not preclude SARS-CoV-2 infection  and should not be used as the sole basis for treatment or other  patient management decisions.  A negative result may occur with  improper specimen collection / handling, submission of specimen other  than nasopharyngeal swab, presence of viral mutation(s) within the  areas targeted by this assay, and inadequate number of viral copies  (<250 copies / mL). A negative result must be combined with clinical  observations, patient history, and epidemiological information. If result is POSITIVE SARS-CoV-2 target nucleic acids are DETECTED. The SARS-CoV-2 RNA is generally detectable in upper and lower  respiratory specimens dur ing the acute phase of infection.  Positive  results are indicative of active infection with SARS-CoV-2.  Clinical  correlation with patient history and other diagnostic information is  necessary to determine patient infection status.  Positive results do  not rule out bacterial infection or co-infection with other viruses. If result is PRESUMPTIVE POSTIVE SARS-CoV-2 nucleic acids MAY BE PRESENT.   A presumptive positive result was obtained on the submitted specimen  and confirmed on repeat testing.  While 2019 novel coronavirus  (SARS-CoV-2) nucleic acids may be present in the submitted sample  additional confirmatory testing may be necessary for epidemiological  and / or clinical management purposes  to differentiate between  SARS-CoV-2 and other Sarbecovirus currently known to infect humans.  If clinically indicated additional testing with an alternate test   methodology [phone]) is advised. The SARS-CoV-2 RNA is generally  detectable in upper and lower respiratory sp ecimens during the acute  phase of infection. The expected result is Negative. Fact Sheet for Patients:  StrictlyIdeas.no Fact Sheet for Healthcare Providers: BankingDealers.co.za This test is not yet approved or cleared by the [place] FDA and has been authorized for detection and/or diagnosis of SARS-CoV-2 by FDA under an Emergency Use Authorization (EUA).  This EUA will remain in effect (meaning this test can be used) for the duration of the COVID-19 declaration under Section 564(b)(1) of the Act, 21 U.S.C.  section 360bbb-3(b)(1), unless the authorization is terminated or revoked sooner. Performed at [facility], [place] [address] [place], [place] [address]   Surgical PCR screen     Status: None   Collection Time: 07/25/19  5:34 PM   Specimen: Nasal Mucosa; Nasal Swab  Result Value Ref Range Status   MRSA, PCR NEGATIVE NEGATIVE Final   Staphylococcus aureus NEGATIVE NEGATIVE Final    Comment: (NOTE) The Xpert SA Assay (FDA approved for NASAL specimens in patients 34 years of age and older), is one component of a comprehensive surveillance program. It is not intended to diagnose infection nor to guide or monitor treatment. Performed at [facility], [place] [address] [place], [place] [address]   SARS CORONAVIRUS 2 (TAT 6-24 HRS) Nasopharyngeal Nasopharyngeal Swab     Status: None   Collection Time: 07/25/19  6:23 PM   Specimen: Nasopharyngeal Swab  Result Value Ref Range Status   SARS Coronavirus 2 NEGATIVE NEGATIVE Final    Comment: (NOTE) SARS-CoV-2 target nucleic acids are NOT DETECTED. The SARS-CoV-2 RNA is generally detectable in upper and lower respiratory specimens during the acute phase of infection. Negative results do not preclude SARS-CoV-2 infection, do not rule out  co-infections with other pathogens, and should not be used as the sole basis for treatment or other patient management decisions. Negative results must be combined with clinical observations, patient history, and epidemiological information. The expected result is Negative. Fact Sheet for Patients: SugarRoll.be Fact Sheet for Healthcare Providers: https://www.woods-mathews.com/ This test is not yet approved or cleared by the [place] FDA and  has been authorized for detection and/or diagnosis of SARS-CoV-2 by FDA under an Emergency Use Authorization (EUA). This EUA will remain  in effect (meaning this test can be used) for the duration of the COVID-19 declaration under Section 56 4(b)(1) of the Act, 21 U.S.C. section 360bbb-3(b)(1), unless the authorization is terminated or revoked sooner. Performed at [facility], [place] [address] [place], [place] [address]       Radiology Studies: No results found.  Marzetta Board, MD, PhD [facility]  Contact via  [email or web address]  [place] P: [phone] F: [phone]

## 2019-07-29 NOTE — Progress Notes (Signed)
MEWS Guidelines - (patients age 82 and over)  Red - At High Risk for Deterioration Yellow - At risk for Deterioration  1. Go to room and assess patient 2. Validate data. Is this patient's baseline? If data confirmed: 3. Is this an acute change? 4. Administer prn meds/treatments as ordered. 5. Note Sepsis score 6. Review goals of care 7. Sports coach, RRT nurse and Provider. 8. Ask Provider to come to bedside.  9. Document patient condition/interventions/response. 10. Increase frequency of vital signs and focused assessments to at least q15 minutes x 4, then q30 minutes x2. - If stable, then q1h x3, then q4h x3 and then q8h or dept. routine. - If unstable, contact Provider & RRT nurse. Prepare for possible transfer. 11. Add entry in progress notes using the smart phrase ".MEWS". 1. Go to room and assess patient 2. Validate data. Is this patient's baseline? If data confirmed: 3. Is this an acute change? 4. Administer prn meds/treatments as ordered? 5. Note Sepsis score 6. Review goals of care 7. Sports coach and Provider 8. Call RRT nurse as needed. 9. Document patient condition/interventions/response. 10. Increase frequency of vital signs and focused assessments to at least q2h x2. - If stable, then q4h x2 and then q8h or dept. routine. - If unstable, contact Provider & RRT nurse. Prepare for possible transfer. 11. Add entry in progress notes using the smart phrase ".MEWS".  Green - Likely stable Lavender - Comfort Care Only  1. Continue routine/ordered monitoring.  2. Review goals of care. 1. Continue routine/ordered monitoring. 2. Review goals of care.   CCMD called to inform of HR 175 SVT.  Placed patient on yellow [place].  HR back to normal.  Patient is stable. On-call MD made aware.

## 2019-07-29 NOTE — Progress Notes (Signed)
Patient refused CPAP. Stated that she feels sick to her stomach.

## 2019-07-29 NOTE — Progress Notes (Addendum)
    Subjective:  No events. Pt sleepy.  Objective:   VITALS:   Vitals:   07/28/19 2152 07/29/19 0254 07/29/19 0500 07/29/19 0818  BP:  (!) 148/76  (!) 141/62  Pulse: 79 82  79  Resp: 18 16  16   Temp:  98.2 F (36.8 C)  98.3 F (36.8 C)  TempSrc:  Oral  Oral  SpO2: 97% 100%  98%  Weight:   84.6 kg   Height:        NAD ABD soft Intact pulses distally Incision: dressing C/D/I Compartment soft iVAC intact Unable to assess motor/sensory due to sleepiness  Lab Results  Component Value Date   WBC 11.0 (H) 07/29/2019   HGB 8.5 (L) 07/29/2019   HCT 24.8 (L) 07/29/2019   MCV 85.2 07/29/2019   PLT 171 07/29/2019   BMET    Component Value Date/Time   NA 131 (L) 07/29/2019 0348   NA 139 08/14/2016   K 3.3 (L) 07/29/2019 0348   CL 97 (L) 07/29/2019 0348   CO2 25 07/29/2019 0348   GLUCOSE 135 (H) 07/29/2019 0348   BUN 17 07/29/2019 0348   BUN 28 (A) 08/14/2016   CREATININE 0.60 07/29/2019 0348   CREATININE 0.94 (H) 06/30/2019 1005   CALCIUM 8.3 (L) 07/29/2019 0348   GFRNONAA >60 07/29/2019 0348   GFRNONAA 58 (L) 06/20/2019 0936   GFRNONAA 57 (L) 10/16/2018 0849   GFRAA >60 07/29/2019 0348   GFRAA >60 06/20/2019 0936   GFRAA 66 10/16/2018 0849     Assessment/Plan: 3 Days Post-Op   Active Problems:   Right femoral fracture (Andrea [name])   TDWB with walker DVT ppx: Eliquis, SCDs, TEDS PO pain control PT/OT Dispo: D/C planning, convert VAC unit to portable Prevena unit upon d/c   Andrea [name] Andrea [name] 07/29/2019, 11:48 AM   Rod Can, MD Cell: [phone] [place] is now [facility]  [place] [address] [place], [place], [place] [address] Phone: [phone] [email or web address] Facebook  [employer]

## 2019-07-29 NOTE — Progress Notes (Signed)
Patient and caregiver notified of lab results from [facility].

## 2019-07-29 NOTE — Evaluation (Signed)
Occupational Therapy Evaluation Patient Details Name: Andrea [name] MRN: [id number] DOB: 1935-04-14 Today's Date: 07/29/2019    History of Present Illness Andrea [name] is a 84 y.o. female with history of A. fib on Eliquis, remote bilateral TKA, left breast cancer status post lumpectomy and radiation now on chemo, OSA on CPAP, hypothyroidism, overactive bladder, depression/anxiety and DM-2 presenting with mechanical fall at home resulting [name] distal femur periprosthetic fx, no s/p ORIF, TWB, unlimited ROM knee   Clinical Impression   This 84 yo female admitted and underwent above presents to acute OT with increased lethargy today thus affecting her ability to participate as much [name] OT eval (as she did yesterday [name] PT eval). Followed one step commands intermittently and with increased time, needed Max -total A for bed mobility, limited eye opening all affecting her ability to A with basic ADLs. She will benefit from acute OT with follow up at SNF to work back towards PLOF.     Follow Up Recommendations  SNF;Supervision/Assistance - 24 hour    Equipment Recommendations  Other (comment)(TBD next venue)       Precautions / Restrictions Precautions Precautions: Fall Restrictions Weight Bearing Restrictions: Yes RLE Weight Bearing: Touchdown weight bearing RLE Partial Weight Bearing Percentage or Pounds: Per order pt is partial WB'ing (30%) but [name] op note it states TDWB--secure chat text with Andrea [name] and pt is TDWB.      Mobility Bed Mobility Overal bed mobility: Needs Assistance Bed Mobility: Supine to Sit;Sit to Supine     Supine to sit: Max assist Sit to supine: Total assist         Balance Overall balance assessment: Needs assistance Sitting-balance support: No upper extremity supported;Feet supported Sitting balance-[name]: Poor Sitting balance - Comments: Pt not attempting to self correct posture, even when cued did not as well. Slowly leaning more and more  to left Postural control: Left lateral lean                                 ADL either performed or assessed with clinical judgement   ADL                                         General ADL Comments: total A     Vision   Additional Comments: Pt kept eyes shut most of session unless asked to open them            Pertinent Vitals/Pain Pain Location: When asked where--she could not state --"I dont' know" Pain Descriptors / Indicators: Moaning(once had her sitting EOB) Pain Intervention(s): Repositioned(Andrea [name] to check on patient)     Hand Dominance Right   Extremity/Trunk Assessment Upper Extremity Assessment Upper Extremity Assessment: Generalized weakness           Communication Communication Communication: No difficulties   Cognition Arousal/Alertness: Lethargic   Overall Cognitive Status: Impaired/Different from baseline                                 General Comments: normally conversant per [name] that is [name] room with her (helps her at home)              Home Living Family/patient expects to be discharged to:: Skilled nursing facility Living Arrangements:  Children(lives with dtr) Available Help at Discharge: Family;Personal care attendant([name] M-F 8am-2pm) Type of Home: House Home Access: Stairs to enter Entrance Stairs-Number of Steps: 5 STE Entrance Stairs-Rails: Left;Right Home Layout: One level     Bathroom Shower/Tub: Teacher, early years/pre: Standard     Home Equipment: Environmental consultant - 2 wheels;Bedside commode;Shower seat          Prior Functioning/Environment Level of Independence: Needs assistance  Gait / Transfers Assistance Needed: pt ambulated Mod I with RW, assistance for stair negotiation ADL's / Homemaking Assistance Needed: assistance for bathing/dressing from hired caregiver            OT Problem List: Decreased strength;Decreased range of motion;Decreased activity  tolerance;Impaired balance (sitting and/or standing);Decreased cognition;Decreased safety awareness;Decreased knowledge of use of DME or AE      OT Treatment/Interventions: Self-care/ADL training;DME and/or AE instruction;Patient/family education;Balance training    OT Goals(Current goals can be found [name] the care plan section) Acute Rehab OT Goals Patient Stated Goal: pt unable to state OT Goal Formulation: Patient unable to participate [name] goal setting Time For Goal Achievement: 08/12/19 Potential to Achieve Goals: Fair  OT Frequency: Min 1X/week              AM-PAC OT "6 Clicks" Daily Activity     Outcome Measure Help from another person eating meals?: Total Help from another person taking care of personal grooming?: Total Help from another person toileting, which includes using toliet, bedpan, or urinal?: Total Help from another person bathing (including washing, rinsing, drying)?: Total Help from another person to put on and taking off regular upper body clothing?: Total Help from another person to put on and taking off regular lower body clothing?: Total 6 Click Score: 6   End of Session Nurse Communication: (pt quit lethargic, [name] asking about meds she had been given)  Activity Tolerance: Patient limited by lethargy Patient left: [name] bed;with call bell/phone within reach;with bed alarm set;with family/visitor present([name])  OT Visit Diagnosis: Other abnormalities of gait and mobility (R26.89);Muscle weakness (generalized) (M62.81);Other symptoms and signs involving cognitive function                Time: 1023-1059 OT Time Calculation (min): 36 min Charges:  OT General Charges $OT Visit: 1 Visit OT Evaluation $OT Eval Moderate Complexity: 1 Mod OT Treatments $Self Care/Home Management : 8-22 mins  Andrea [name], OTR/L Acute [employer] Pager [phone] Office [phone]     Andrea [name] 07/29/2019, 11:31 AM

## 2019-07-30 DIAGNOSIS — F419 Anxiety disorder, unspecified: Secondary | ICD-10-CM

## 2019-07-30 DIAGNOSIS — R410 Disorientation, unspecified: Secondary | ICD-10-CM

## 2019-07-30 DIAGNOSIS — D500 Iron deficiency anemia secondary to blood loss (chronic): Secondary | ICD-10-CM

## 2019-07-30 DIAGNOSIS — G9341 Metabolic encephalopathy: Secondary | ICD-10-CM

## 2019-07-30 DIAGNOSIS — S72341D Displaced spiral fracture of shaft of right femur, subsequent encounter for closed fracture with routine healing: Secondary | ICD-10-CM

## 2019-07-30 DIAGNOSIS — E1169 Type 2 diabetes mellitus with other specified complication: Secondary | ICD-10-CM

## 2019-07-30 LAB — COMPREHENSIVE METABOLIC PANEL
ALT: 28 U/L (ref 0–44)
AST: 28 U/L (ref 15–41)
Albumin: 2.8 g/dL — ABNORMAL LOW (ref 3.5–5.0)
Alkaline Phosphatase: 51 U/L (ref 38–126)
Anion gap: 10 (ref 5–15)
BUN: 20 mg/dL (ref 8–23)
CO2: 23 mmol/L (ref 22–32)
Calcium: 8.2 mg/dL — ABNORMAL LOW (ref 8.9–10.3)
Chloride: 99 mmol/L (ref 98–111)
Creatinine, Ser: 0.67 mg/dL (ref 0.44–1.00)
GFR calc Af Amer: >60 mL/min (ref >60)
GFR calc non Af Amer: >60 mL/min (ref >60)
Glucose, Bld: 118 mg/dL — ABNORMAL HIGH (ref 70–99)
Potassium: 3.7 mmol/L (ref 3.5–5.1)
Sodium: 132 mmol/L — ABNORMAL LOW (ref 135–145)
Total Bilirubin: 1.2 mg/dL (ref 0.3–1.2)
Total Protein: 5.3 g/dL — ABNORMAL LOW (ref 6.5–8.1)

## 2019-07-30 LAB — GLUCOSE, CAPILLARY
Glucose-Capillary: 118 mg/dL — ABNORMAL HIGH (ref 70–99)
Glucose-Capillary: 132 mg/dL — ABNORMAL HIGH (ref 70–99)
Glucose-Capillary: 114 mg/dL — ABNORMAL HIGH (ref 70–99)
Glucose-Capillary: 139 mg/dL — ABNORMAL HIGH (ref 70–99)

## 2019-07-30 LAB — CBC
HCT: 24.5 % — ABNORMAL LOW (ref 36.0–46.0)
Hemoglobin: 8.4 g/dL — ABNORMAL LOW (ref 12.0–15.0)
MCH: 29.6 pg (ref 26.0–34.0)
MCHC: 34.3 g/dL (ref 30.0–36.0)
MCV: 86.3 fL (ref 80.0–100.0)
Platelets: 201 10*3/uL (ref 150–400)
RBC: 2.84 MIL/uL — ABNORMAL LOW (ref 3.87–5.11)
RDW: 14.5 % (ref 11.5–15.5)
WBC: 9.1 10*3/uL (ref 4.0–10.5)
nRBC: 0 % (ref 0.0–0.2)

## 2019-07-30 LAB — IRON AND TIBC
Iron: 22 ug/dL — ABNORMAL LOW (ref 28–170)
Saturation Ratios: 9 % — ABNORMAL LOW (ref 10.4–31.8)
TIBC: 249 ug/dL — ABNORMAL LOW (ref 250–450)
UIBC: 227 ug/dL

## 2019-07-30 LAB — RETICULOCYTES
Immature Retic Fract: 29.9 % — ABNORMAL HIGH (ref 2.3–15.9)
RBC.: 2.85 MIL/uL — ABNORMAL LOW (ref 3.87–5.11)
Retic Count, Absolute: 141.6 10*3/uL (ref 19.0–186.0)
Retic Ct Pct: 5 % — ABNORMAL HIGH (ref 0.4–3.1)

## 2019-07-30 LAB — TSH: TSH: 1.414 u[IU]/mL (ref 0.350–4.500)

## 2019-07-30 LAB — FERRITIN: Ferritin: 40 ng/mL (ref 11–307)

## 2019-07-30 LAB — FOLATE: Folate: 23.8 ng/mL (ref >5.9)

## 2019-07-30 LAB — VITAMIN B12: Vitamin B-12: 2198 pg/mL — ABNORMAL HIGH (ref 180–914)

## 2019-07-30 MED ORDER — DULOXETINE HCL 60 MG PO CPEP
60.0000 mg | ORAL_CAPSULE | Freq: Every day | ORAL | Status: DC
  Administered 2019-07-30 – 2019-08-06 (×8): 60 mg via ORAL
  Filled 2019-07-30 (×8): qty 1

## 2019-07-30 MED ORDER — SODIUM CHLORIDE 0.9 % IV BOLUS
500.0000 mL | Freq: Once | INTRAVENOUS | Status: AC
  Administered 2019-07-30: 500 mL via INTRAVENOUS

## 2019-07-30 MED ORDER — TRAZODONE HCL 100 MG PO TABS: 100.0000 mg | ORAL_TABLET | Freq: Every day | ORAL | Status: DC

## 2019-07-30 MED ORDER — DULOXETINE HCL 60 MG PO CPEP: 60.0000 mg | ORAL_CAPSULE | Freq: Every day | ORAL | 3 refills | Status: DC

## 2019-07-30 MED ORDER — SENNOSIDES-DOCUSATE SODIUM 8.6-50 MG PO TABS: 1.0000 | ORAL_TABLET | Freq: Two times a day (BID) | ORAL | Status: DC | PRN

## 2019-07-30 MED ORDER — NICOTINE 7 MG/24HR TD PT24: 7.0000 mg | MEDICATED_PATCH | Freq: Every day | TRANSDERMAL | 0 refills | Status: DC

## 2019-07-30 MED ORDER — SODIUM CHLORIDE 0.9 % IV SOLN
510.0000 mg | Freq: Once | INTRAVENOUS | Status: AC
  Administered 2019-07-31: 510 mg via INTRAVENOUS
  Filled 2019-07-30 (×2): qty 17

## 2019-07-30 NOTE — Care Management Important Message (Signed)
Important Message  Patient Details  Name: Andrea [name] MRN: [id number] Date of Birth: 1935/01/17   Medicare Important Message Given:  Yes     Memory Argue 07/30/2019, 2:13 PM

## 2019-07-30 NOTE — Progress Notes (Addendum)
    Subjective:  No c/o currently. Became dizzy & hypotensive when she sat up with therapy today.  Objective:   VITALS:   Vitals:   07/30/19 0747 07/30/19 1227 07/30/19 1409 07/30/19 1521  BP: (!) 156/63 (!) 160/67 98/63 (!) 139/58  Pulse: 82 79 (!) 120 80  Resp: 16 18  17   Temp: 99.3 F (37.4 C) 98.7 F (37.1 C)  98.8 F (37.1 C)  TempSrc: Oral Oral  Oral  SpO2: 97% 98%  96%  Weight:      Height:        NAD ABD soft Sensation intact distally Intact pulses distally Dorsiflexion/Plantar flexion intact Incision: dressing C/D/I Compartment soft iVAC intact   Lab Results  Component Value Date   WBC 9.1 07/30/2019   HGB 8.4 (L) 07/30/2019   HCT 24.5 (L) 07/30/2019   MCV 86.3 07/30/2019   PLT 201 07/30/2019   BMET    Component Value Date/Time   NA 132 (L) 07/30/2019 0428   NA 139 08/14/2016   K 3.7 07/30/2019 0428   CL 99 07/30/2019 0428   CO2 23 07/30/2019 0428   GLUCOSE 118 (H) 07/30/2019 0428   BUN 20 07/30/2019 0428   BUN 28 (A) 08/14/2016   CREATININE 0.67 07/30/2019 0428   CREATININE 0.94 (H) 06/30/2019 1005   CALCIUM 8.2 (L) 07/30/2019 0428   GFRNONAA >60 07/30/2019 0428   GFRNONAA 58 (L) 06/20/2019 0936   GFRNONAA 57 (L) 10/16/2018 0849   GFRAA >60 07/30/2019 0428   GFRAA >60 06/20/2019 0936   GFRAA 66 10/16/2018 0849     Assessment/Plan: 4 Days Post-Op   Active Problems:   Right femoral fracture (HCC)   TDWB with walker DVT ppx: Eliquis, SCDs, TEDS PO pain control PT/OT Hypotn per primary team Dispo: D/C planning, convert VAC unit to portable Prevena unit upon d/c   Andrea [name] 07/30/2019, 3:39 PM   Rod Can, MD Cell: [phone] [place] is now [facility]  [place] [address], [place], [place] [address] Phone: [phone] [email or web address] Facebook  [employer]

## 2019-07-30 NOTE — Progress Notes (Signed)
PROGRESS NOTE  Andrea [name] [id number] DOB: [birth year]   PCP: Gayland Curry, DO  Patient is from: home  DOA: 07/25/2019 LOS: 5  Brief Narrative / Interim history: 84 y.o.femalewith history ofA. fib on Eliquis, remote bilateral TKA, left breast cancer status post lumpectomy and radiation now on chemo, OSA on CPAP, hypothyroidism, overactive bladder, depression/anxiety and DM-2 presenting with mechanical fall at home and acute displaced spiral fracture of the distal femoral metadiaphysis.  She underwent open reduction internal fixation of right femoral with application of wound VAC on 07/26/2019 by Dr. Lyla Glassing.  Postop course complicated by acute encephalopathy/drowsiness without focal neuro deficit.  Work-up including CT head and urinalysis were negative.  She has no elevated bicarb to suggest CO2 retention.  She she was on low-dose Remeron but not taking pain medications.   Patient was evaluated by PT/OT who recommended SNF for ongoing PT/OT.[name]  She is on Eliquis for A. Fib. She would be touchdown weightbearing on the right LE with walker.  She will be discharged with portable Praveena unit.  See individual problem list below for more on hospital course.  Subjective:   Objective: Vitals:   07/30/19 0747 07/30/19 1227 07/30/19 1409 07/30/19 1521  BP: (!) 156/63 (!) 160/67 98/63 (!) 139/58  Pulse: 82 79 (!) 120 80  Resp: 16 18  17   Temp: 99.3 F (37.4 C) 98.7 F (37.1 C)  98.8 F (37.1 C)  TempSrc: Oral Oral  Oral  SpO2: 97% 98%  96%  Weight:      Height:        Intake/Output Summary (Last 24 hours) at 07/30/2019 1828 Last data filed at 07/30/2019 0900 Gross per 24 hour  Intake 480 ml  Output 550 ml  Net -70 ml   Filed Weights   07/27/19 0500 07/28/19 0500 07/29/19 0500  Weight: 92 kg 92.2 kg 84.6 kg    Examination:  GENERAL: No acute distress.  Appears well.  HEENT: MMM.  Vision and hearing grossly intact.  NECK: Supple.  No apparent JVD.  RESP:  No IWOB.  Good air movement bilaterally. CVS: IR.  Normal rate. Heart sounds normal.  ABD/GI/GU: Bowel sounds present. Soft. Non tender.  MSK/EXT:  Moves extremities. No apparent deformity or edema.  SKIN: no apparent skin lesion or wound NEURO: Awake, alert and oriented to self and partial place only.  She knows she is in [place].  Follows some command.  Cranial, motor, light sensation grossly intact..  No gross deficit.  PSYCH: Calm.  Flat affect.  Assessment & Plan: Periprosthetic displaced right distal femoral fracture -ORIF right femur, wound VAC in place, touchdown weightbearing on the right lower extremity.  -Eliquis to begin at 2.5 mg twice daily on 9/6, and after 72 hours of full home dose to be resumedon 9/9. Praveena wound VAC on discharge -PT recommending SNF for ongoing PT/OT.  Acute metabolic encephalopathy/drowsiness: Suspect delirium. Noted to be less interactive on 9/8.  No focal neuro deficit.  Only on small dose Remeron at night.  Has not been taking pain medications.  Reportedly wide awake alert and interactive at baseline per family.  Denies history of dementia.  CT head without acute intracranial finding.  Urinalysis normal.  No hypercarbia on BMP.She has no fever or respiratory symptoms to suggest infectious process.  TSH and vitamin B12 normal. -Adjustment to psych medications as below.  Paroxysmal A. fib, currently in sinus rhythm -Continue Eliquis -Not on rate or rhythm control  Acute blood loss anemia: Anemia  panel consistent with iron deficiency. -Hgb dropped to 6.8 on 9/6 postop.  Transfused 1 unit and up to 8.5 and remained stable. -Will infuse Feraheme 9/10  Hypothyroidism: TSH within normal. -Continue Synthroid  OSA on CPAP -Continue nightly CPAP  Left breast cancer status post lumpectomy and radiation, now on chemotherapy -Followed by Dr. Lindi Adie as an outpatient  Tobacco use -Nicotine patch  Anxiety/depression -Reduced home Cymbalta to 60  mg although this is activating.  -Continue home low-dose Remeron. -Hold home trazodone.  Nutrition: Reportedly poor p.o. intake per RN. -IV normal saline bolus  DVT prophylaxis: On Eliquis Code Status: DNR/DNI Family Communication: Patient and/or RN. Available if any question. Disposition Plan: SNF.  Medically ready.  Encephalopathy likely delirium Consultants: Orthopedic surgery  Procedures:  9/5-ORIF by Dr. Lyla Glassing. MRSA PCR negative.  Microbiology summarized: [id number] negative.  Antimicrobials: Anti-infectives (From admission, onward)   Start     Dose/Rate Route Frequency Ordered Stop   07/26/19 1430  ceFAZolin (ANCEF) IVPB 2g/100 mL premix     2 g 200 mL/hr over 30 Minutes Intravenous Every 6 hours 07/26/19 1353 07/26/19 2335   07/26/19 0645  ceFAZolin (ANCEF) IVPB 2g/100 mL premix     2 g 200 mL/hr over 30 Minutes Intravenous On call to O.R. 07/26/19 [id number] 07/26/19 0903   07/26/19 0641  ceFAZolin (ANCEF) 2-4 GM/100ML-% IVPB    Note to Pharmacy: Henrine Screws   : cabinet override      07/26/19 0641 07/26/19 0833      Sch Meds:  Scheduled Meds: . sodium chloride   Intravenous Once  . apixaban  2.5 mg Oral BID  . docusate sodium  100 mg Oral BID  . DULoxetine  60 mg Oral Daily  . insulin aspart  0-5 Units Subcutaneous QHS  . insulin aspart  0-9 Units Subcutaneous TID WC  . levothyroxine  50 mcg Oral Q0600  . mirtazapine  7.5 mg Oral QHS  . nicotine  7 mg Transdermal Daily  . oxybutynin  10 mg Oral Daily  . vitamin B-12  500 mcg Oral Daily   Continuous Infusions: PRN Meds:.bisacodyl, menthol-cetylpyridinium **OR** phenol, metoCLOPramide **OR** metoCLOPramide (REGLAN) injection, morphine injection, ondansetron **OR** ondansetron (ZOFRAN) IV, oxyCODONE, senna-docusate, sodium phosphate, zolpidem   I have personally reviewed the following labs and images: CBC: Recent Labs  Lab 07/25/19 0734 07/26/19 0401  07/27/19 0727 07/27/19 1812 07/28/19 0430 07/29/19  0348 07/30/19 0428  WBC 12.2* 8.4  --  12.1*  --  10.8* 11.0* 9.1  NEUTROABS 10.8*  --   --   --   --   --   --   --   HGB 11.9* 10.7*   < > 6.8* 7.9* 8.3* 8.5* 8.4*  HCT 37.2 31.9*   < > 20.4* 23.2* 24.5* 24.8* 24.5*  MCV 91.2 88.4  --  88.3  --  86.6 85.2 86.3  PLT 197 214  --  177  --  168 171 201   < > = values in this interval not displayed.   BMP &GFR Recent Labs  Lab 07/26/19 0401 07/26/19 1012 07/27/19 0727 07/28/19 0430 07/29/19 0348 07/30/19 0428  NA 136 136 135 132* 131* 132*  K 4.3 4.4 3.8 3.5 3.3* 3.7  CL 102  --  100 98 97* 99  CO2 25  --  25 24 25 23   GLUCOSE 167* 165* 144* 123* 135* 118*  BUN 24*  --  25* 19 17 20   CREATININE 0.74  --  0.75 0.63 0.60  0.67  CALCIUM 8.9  --  8.5* 8.2* 8.3* 8.2*   Estimated Creatinine Clearance: 53.5 mL/min (by C-G formula based on SCr of 0.67 mg/dL). Liver & Pancreas: Recent Labs  Lab 07/25/19 0734 07/30/19 0428  AST 17 28  ALT 13 28  ALKPHOS 58 51  BILITOT 0.6 1.2  PROT 6.2* 5.3*  ALBUMIN 3.4* 2.8*   No results for input(s): LIPASE, AMYLASE in the last 168 hours. No results for input(s): AMMONIA in the last 168 hours. Diabetic: No results for input(s): HGBA1C in the last 72 hours. Recent Labs  Lab 07/29/19 1636 07/29/19 2112 07/30/19 0701 07/30/19 1147 07/30/19 1616  GLUCAP 133* 113* 118* 132* 114*   Cardiac Enzymes: No results for input(s): CKTOTAL, CKMB, CKMBINDEX, TROPONINI in the last 168 hours. No results for input(s): PROBNP in the last 8760 hours. Coagulation Profile: Recent Labs  Lab 07/25/19 0734  INR 1.2   Thyroid Function Tests: Recent Labs    07/30/19 1438  TSH 1.414   Lipid Profile: No results for input(s): CHOL, HDL, LDLCALC, TRIG, CHOLHDL, LDLDIRECT in the last 72 hours. Anemia Panel: Recent Labs    07/30/19 1438  VITAMINB12 2,198*  FOLATE 23.8  FERRITIN 40  TIBC 249*  IRON 22*  RETICCTPCT 5.0*   Urine analysis:    Component Value Date/Time   COLORURINE YELLOW  07/29/2019 1350   APPEARANCEUR CLEAR 07/29/2019 1350   APPEARANCEUR Cloudy (A) 09/22/2014 1015   LABSPEC 1.015 07/29/2019 1350   PHURINE 6.0 07/29/2019 1350   GLUCOSEU NEGATIVE 07/29/2019 1350   HGBUR SMALL (A) 07/29/2019 1350   BILIRUBINUR NEGATIVE 07/29/2019 1350   BILIRUBINUR neg 09/06/2018 1110   BILIRUBINUR Negative 09/22/2014 1015   KETONESUR 20 (A) 07/29/2019 1350   PROTEINUR NEGATIVE 07/29/2019 1350   UROBILINOGEN 0.2 09/06/2018 1110   UROBILINOGEN 0.2 03/11/2013 2003   NITRITE NEGATIVE 07/29/2019 1350   LEUKOCYTESUR NEGATIVE 07/29/2019 1350   Sepsis Labs: Invalid input(s): PROCALCITONIN, [place]  Microbiology: Recent Results (from the past 240 hour(s))  SARS Coronavirus 2 [facility] order, Performed in [facility] hospital lab) Nasopharyngeal Nasopharyngeal Swab     Status: None   Collection Time: 07/25/19  7:39 AM   Specimen: Nasopharyngeal Swab  Result Value Ref Range Status   SARS Coronavirus 2 NEGATIVE NEGATIVE Final    Comment: (NOTE) If result is NEGATIVE SARS-CoV-2 target nucleic acids are NOT DETECTED. The SARS-CoV-2 RNA is generally detectable in upper and lower  respiratory specimens during the acute phase of infection. The lowest  concentration of SARS-CoV-2 viral copies this assay can detect is 250  copies / mL. A negative result does not preclude SARS-CoV-2 infection  and should not be used as the sole basis for treatment or other  patient management decisions.  A negative result may occur with  improper specimen collection / handling, submission of specimen other  than nasopharyngeal swab, presence of viral mutation(s) within the  areas targeted by this assay, and inadequate number of viral copies  (<250 copies / mL). A negative result must be combined with clinical  observations, patient history, and epidemiological information. If result is POSITIVE SARS-CoV-2 target nucleic acids are DETECTED. The SARS-CoV-2 RNA is generally detectable in upper  and lower  respiratory specimens dur ing the acute phase of infection.  Positive  results are indicative of active infection with SARS-CoV-2.  Clinical  correlation with patient history and other diagnostic information is  necessary to determine patient infection status.  Positive results do  not rule out bacterial infection  or co-infection with other viruses. If result is PRESUMPTIVE POSTIVE SARS-CoV-2 nucleic acids MAY BE PRESENT.   A presumptive positive result was obtained on the submitted specimen  and confirmed on repeat testing.  While 2019 novel coronavirus  (SARS-CoV-2) nucleic acids may be present in the submitted sample  additional confirmatory testing may be necessary for epidemiological  and / or clinical management purposes  to differentiate between  SARS-CoV-2 and other Sarbecovirus currently known to infect humans.  If clinically indicated additional testing with an alternate test  methodology [phone]) is advised. The SARS-CoV-2 RNA is generally  detectable in upper and lower respiratory sp ecimens during the acute  phase of infection. The expected result is Negative. Fact Sheet for Patients:  StrictlyIdeas.no Fact Sheet for Healthcare Providers: BankingDealers.co.za This test is not yet approved or cleared by the [place] FDA and has been authorized for detection and/or diagnosis of SARS-CoV-2 by FDA under an Emergency Use Authorization (EUA).  This EUA will remain in effect (meaning this test can be used) for the duration of the COVID-19 declaration under Section 564(b)(1) of the Act, 21 U.S.C. section 360bbb-3(b)(1), unless the authorization is terminated or revoked sooner. Performed at [facility], [place] [address] [place], [place] [address]   Surgical PCR screen     Status: None   Collection Time: 07/25/19  5:34 PM   Specimen: Nasal Mucosa; Nasal Swab  Result Value Ref Range Status    MRSA, PCR NEGATIVE NEGATIVE Final   Staphylococcus aureus NEGATIVE NEGATIVE Final    Comment: (NOTE) The Xpert SA Assay (FDA approved for NASAL specimens in patients 12 years of age and older), is one component of a comprehensive surveillance program. It is not intended to diagnose infection nor to guide or monitor treatment. Performed at [facility], [place] [address], [place], [place] [address]   SARS CORONAVIRUS 2 (TAT 6-24 HRS) Nasopharyngeal Nasopharyngeal Swab     Status: None   Collection Time: 07/25/19  6:23 PM   Specimen: Nasopharyngeal Swab  Result Value Ref Range Status   SARS Coronavirus 2 NEGATIVE NEGATIVE Final    Comment: (NOTE) SARS-CoV-2 target nucleic acids are NOT DETECTED. The SARS-CoV-2 RNA is generally detectable in upper and lower respiratory specimens during the acute phase of infection. Negative results do not preclude SARS-CoV-2 infection, do not rule out co-infections with other pathogens, and should not be used as the sole basis for treatment or other patient management decisions. Negative results must be combined with clinical observations, patient history, and epidemiological information. The expected result is Negative. Fact Sheet for Patients: SugarRoll.be Fact Sheet for Healthcare Providers: https://www.woods-mathews.com/ This test is not yet approved or cleared by the [place] FDA and  has been authorized for detection and/or diagnosis of SARS-CoV-2 by FDA under an Emergency Use Authorization (EUA). This EUA will remain  in effect (meaning this test can be used) for the duration of the COVID-19 declaration under Section 56 4(b)(1) of the Act, 21 U.S.C. section 360bbb-3(b)(1), unless the authorization is terminated or revoked sooner. Performed at [facility], [place] [address] [place], [place] [address]     Radiology Studies: Ct Head Wo Contrast  Result Date: 07/29/2019 CLINICAL DATA:   Encephalopathy EXAM: CT HEAD WITHOUT CONTRAST TECHNIQUE: Contiguous axial images were obtained from the base of the skull through the vertex without intravenous contrast. COMPARISON:  Head CT 07/25/2019 FINDINGS: Brain: There is no acute intracranial hemorrhage or acute demarcated cortical infarction. No evidence of intracranial  mass. No midline shift or extra-axial fluid collection. Advanced hypoattenuation within the cerebral white matter is similar to prior examination and nonspecific, but consistent with chronic small vessel ischemic disease. Mild generalized parenchymal atrophy. Vascular: No hyperdense vessel. Atherosclerotic calcification of the carotid artery siphons. Skull: No calvarial fracture or suspicious osseous lesion. Sinuses/Orbits: Imaged orbits demonstrate no acute abnormality. No significant paranasal sinus disease or mastoid effusion at the imaged levels. IMPRESSION: No evidence of acute intracranial abnormality. Mild generalized parenchymal atrophy with advanced chronic small vessel ischemic disease. Electronically Signed   By: Kellie Simmering   On: 07/29/2019 19:33    Taye T. [place]  If 7PM-7AM, please contact night-coverage [email or web address] Password [facility] 07/30/2019, 6:28 PM

## 2019-07-30 NOTE — Plan of Care (Signed)
  Problem: Education: Goal: Knowledge of General Education information will improve Description Including pain rating scale, medication(s)/side effects and non-pharmacologic comfort measures Outcome: Progressing   

## 2019-07-30 NOTE — TOC Initial Note (Addendum)
Transition of Care [facility] [place]) - Initial/Assessment Note    Patient Details  Name: Andrea [name] MRN: [id number] Date of Birth: 02/05/1935  [facility] CM/SW Contact:    Sable Feil, LCSW Phone Number: 07/30/2019, 5:58 PM  Clinical Narrative:  CSW visited room and patient was lying in bed and did not respond when spoken to. She napped during CSW's visit. Patient's caregiver, [name] talked with CSW. Ms. Suella Broad reported that she has been patient's caregiver for 8 years and she is like family to patient and her daughter Angelita Ingles.  She explained that she was 3 days a week with patient from 8 am to 1:30 pm, but this changed recently and she now works 5 days a week and usually leaves between 1 - 2 pm after getting her in bed before she leaves. Caregiver reported that Ms. Rolls has been falling at home recently and fell this past Friday, came to hospital on the 4th and had surgery this past Saturday. Per Ms. Suella Broad, the patient expressed concern that Ms. Mcaskill has not been eating (is nauseous per caregiver) or drinking since being hospitalized and has only had a few ice chips, and in addition had what she thinks was a seizure today while on the potty. Ms. Suella Broad reported that prior to hospitalization Ms. Keagy was alert, oriented, could walk with a can, fix her own food with minimal assistance, took care of her own bills, and took walks with her when she felt up to it, could get in and out of the bed with no help and could go down the steps on the outside of her home. Ms. Ayers added that patient's daughter assisted her mother with bathing and did her hair. Per Ms. Suella Broad, patient has breast cancer and has had radiation and is still getting chemotherapy.  CSW talked with patient's daughter, Brittiany Messman by phone and she confirmed that Ms. Suella Broad has been with patient many years, is like family and added that she and [name] are the primary caregivers for her mother. Daughter reported that  she has a brother who lives in [place], who does not provide much assistance with their mother and she also has a brother that lives in [place]. When asked, daughter responded that her mom has been to rehab at [facility], however her mother left before the rehab was over because she did not like the care she was receiving. CSW explained SNF consult, facility search process and gave Medicare.gov SNF list to caregiver per daughter's request.                 Expected Discharge Plan: Skilled Nursing Facility Barriers to Discharge: Continued Medical Work up   Patient Goals and CMS Choice   CMS Medicare.gov Compare Post Acute Care list provided to:: Patient Represenative (must comment)(SNF list left with caregiver [name] to be given to daughter Kinzlie Perrault) Choice offered to / list presented to : Adult Children  Expected Discharge Plan and Services Expected Discharge Plan: [place] In-house Referral: Clinical Social Work   Post Acute Care Choice: [place]   Expected Discharge Date: 07/30/19                                   Prior Living Arrangements/Services   Lives with:: Adult Children(Daughter Thanya Voeks)   Do you feel safe going back to the place where you live?: No   Daughter in  agreement with SNF for ST rehab prior to patient returning home  Need for Family Participation in Patient Care: No (Comment) Care giver support system in place?: Yes (comment) Current home services: [facility] Criminal Activity/Legal Involvement Pertinent to Current Situation/Hospitalization: No - Comment as needed  Activities of Daily Living Home Assistive Devices/Equipment: Bedside commode/3-in-1, CPAP, Eyeglasses, Grab bars in shower, Walker (specify type), Shower chair with back ADL Screening (condition at time of admission) Patient's cognitive ability adequate to safely complete daily activities?: Yes Is the patient deaf or have difficulty hearing?:  No Does the patient have difficulty seeing, even when wearing glasses/contacts?: No Does the patient have difficulty concentrating, remembering, or making decisions?: No Patient able to express need for assistance with ADLs?: Yes Does the patient have difficulty dressing or bathing?: Yes Independently performs ADLs?: No Communication: Independent Dressing (OT): Needs assistance Is this a change from baseline?: Change from baseline, expected to last >3 days Grooming: Needs assistance Is this a change from baseline?: Change from baseline, expected to last >3 days Feeding: Needs assistance Is this a change from baseline?: Change from baseline, expected to last >3 days Bathing: Needs assistance Is this a change from baseline?: Change from baseline, expected to last >3 days Toileting: Dependent Is this a change from baseline?: Change from baseline, expected to last >3days In/Out Bed: Dependent Is this a change from baseline?: Change from baseline, expected to last >3 days Walks in Home: Dependent Is this a change from baseline?: Change from baseline, expected to last >3 days Does the patient have difficulty walking or climbing stairs?: Yes Weakness of Legs: Right Weakness of Arms/Hands: None  Permission Sought/Granted Permission sought to share information with : Other (comment)(Patient not fully oriented and unable to give consent) Permission granted to share information with : No(Patient unable to give consent)              Emotional Assessment Appearance:: Appears stated age Attitude/Demeanor/Rapport: Other (comment)(Patient was quiet during visit) Affect (typically observed): Quiet Orientation: : Oriented to Self Alcohol / Substance Use: Tobacco Use, Alcohol Use, Illicit Drugs(Per H&P patient smokes, has current alcohol consumption (amt. unknown) and does not use illicit drugs) Psych Involvement: No (comment)  Admission diagnosis:  Fall [W19.XXXA] Closed fracture of distal  end of right femur, unspecified fracture morphology, initial encounter [facility]) [S72.401A] Patient Active Problem List   Diagnosis Date Noted  . Right femoral fracture ([place]) 07/25/2019  . Personal history of fall 07/03/2019  . Cigarette nicotine dependence without complication [id number]  . Chronic right-sided low back pain with right-sided sciatica 07/03/2019  . Overactive bladder 07/03/2019  . Normocytic anemia 07/03/2019  . Malignant neoplasm of upper-outer quadrant of left breast in female, estrogen receptor positive ([place]) 03/12/2019  . Chronic deep vein thrombosis (DVT) of proximal vein of both lower extremities ([place]) 10/24/2018  . Hypercoagulable state due to atrial fibrillation ([place]) 10/18/2017  . Type 2 diabetes mellitus without complication, without long-term current use of insulin ([place]) 10/18/2017  . Numbness of toes 11/27/2016  . Advanced care planning/counseling discussion 04/20/2016  . Obesity 04/20/2016  . Depression 04/20/2016  . Senile osteopenia 04/20/2016  . Paroxysmal A-fib ([place]) 04/20/2016  . Hyperlipidemia 04/20/2016  . OSA on CPAP 10/12/2014  . Raynaud phenomenon 10/12/2014  . Chronic pain syndrome 10/12/2014  . Mixed urge and stress incontinence 10/12/2014  . Snoring 06/19/2014  . Hypersomnia with sleep apnea, unspecified 06/19/2014  . Nocturia more than twice per night 06/19/2014  . Palpitations 06/10/2014  . Urinary incontinence 06/10/2014  .  Scoliosis 06/10/2014  . Hyperglycemia 03/05/2014  . Insomnia 03/05/2014  . Chronically dry eyes 02/12/2014  . Insomnia due to anxiety and fear 02/12/2014  . Hallucination 01/18/2012  . UTI (lower urinary tract infection) 01/18/2012  . Headache(784.0) 09/27/2011  . H/O: encephalitis 09/27/2011  . Hyperlipidemia associated with type 2 diabetes mellitus ([place]) 09/26/2011  . Lumbar radiculopathy 09/26/2011  . Muscle spasm of back 09/26/2011  . Visual disturbance 09/26/2011  . Falls frequently 09/25/2011  . CVD  (cerebrovascular disease) 09/25/2011  . Hyponatremia 09/25/2011  . Obesity, morbid ([place]) 09/25/2011  . Delirium 09/25/2011  . Long term (current) use of anticoagulants 02/10/2011  . Nonspecific (abnormal) findings on radiological and other examination of body structure 05/05/2010  . ABNORMAL LUNG XRAY 05/05/2010  . Edema 04/21/2010  . DEPRESSION/ANXIETY 03/02/2010  . HYPOTENSION 03/02/2010  . DYSPNEA 03/02/2010  . Cough 03/02/2010  . CHEST PAIN 03/02/2010  . Arthropathia 08/06/2009  . Bipolar I disorder, single manic episode ([place]) 08/06/2009  . Chronic pain associated with significant psychosocial dysfunction 08/06/2009  . Clinical depression 08/06/2009  . Adiposity 08/06/2009  . Essential (primary) hypertension 08/06/2009  . Adult hypothyroidism 08/06/2009   PCP:  Gayland Curry, DO Pharmacy:   [id number] - , [place]. AT [address]. [place] [address] Phone: [phone] Fax: [phone]  [id number] - [place], [place]. AT [address]. [place] [address] Phone: [phone] Fax: [phone]    Social Determinants of Health (SDOH) Interventions  No SDOH interventions needed at this time.  Readmission Risk Interventions No flowsheet data found.

## 2019-07-30 NOTE — NC FL2 (Signed)
[place] LEVEL OF CARE SCREENING TOOL     IDENTIFICATION  Patient Name: Andrea [name] Birthdate: 15-Jan-1935 Sex: female Admission Date (Current Location): 07/25/2019  [facility] and [place] Number:  Herbalist and Address:  [facility] [place], [place] [address], [place], [place] [address]      Provider Number: [id number]  Attending Physician Name and Address:  Mercy Riding, MD  Relative Name and Phone Number:  Dayli Priester - daughter; [phone]    Current Level of Care: Hospital Recommended Level of Care: [place] Prior Approval Number:    Date Approved/Denied:   PASRR Number: [id number]  Discharge Plan: SNF    Current Diagnoses: Patient Active Problem List   Diagnosis Date Noted  . Right femoral fracture ([place]) 07/25/2019  . Personal history of fall 07/03/2019  . Cigarette nicotine dependence without complication [id number]  . Chronic right-sided low back pain with right-sided sciatica 07/03/2019  . Overactive bladder 07/03/2019  . Normocytic anemia 07/03/2019  . Malignant neoplasm of upper-outer quadrant of left breast in female, estrogen receptor positive ([place]) 03/12/2019  . Chronic deep vein thrombosis (DVT) of proximal vein of both lower extremities ([place]) 10/24/2018  . Hypercoagulable state due to atrial fibrillation ([place]) 10/18/2017  . Type 2 diabetes mellitus without complication, without long-term current use of insulin ([place]) 10/18/2017  . Numbness of toes 11/27/2016  . Advanced care planning/counseling discussion 04/20/2016  . Obesity 04/20/2016  . Depression 04/20/2016  . Senile osteopenia 04/20/2016  . Paroxysmal A-fib ([place]) 04/20/2016  . Hyperlipidemia 04/20/2016  . OSA on CPAP 10/12/2014  . Raynaud phenomenon 10/12/2014  . Chronic pain syndrome 10/12/2014  . Mixed urge and stress incontinence 10/12/2014  . Snoring 06/19/2014  . Hypersomnia with sleep apnea, unspecified 06/19/2014  . Nocturia  more than twice per night 06/19/2014  . Palpitations 06/10/2014  . Urinary incontinence 06/10/2014  . Scoliosis 06/10/2014  . Hyperglycemia 03/05/2014  . Insomnia 03/05/2014  . Chronically dry eyes 02/12/2014  . Insomnia due to anxiety and fear 02/12/2014  . Hallucination 01/18/2012  . UTI (lower urinary tract infection) 01/18/2012  . Headache(784.0) 09/27/2011  . H/O: encephalitis 09/27/2011  . Hyperlipidemia associated with type 2 diabetes mellitus ([place]) 09/26/2011  . Lumbar radiculopathy 09/26/2011  . Muscle spasm of back 09/26/2011  . Visual disturbance 09/26/2011  . Falls frequently 09/25/2011  . CVD (cerebrovascular disease) 09/25/2011  . Hyponatremia 09/25/2011  . Obesity, morbid ([place]) 09/25/2011  . Delirium 09/25/2011  . Long term (current) use of anticoagulants 02/10/2011  . Nonspecific (abnormal) findings on radiological and other examination of body structure 05/05/2010  . ABNORMAL LUNG XRAY 05/05/2010  . Edema 04/21/2010  . DEPRESSION/ANXIETY 03/02/2010  . HYPOTENSION 03/02/2010  . DYSPNEA 03/02/2010  . Cough 03/02/2010  . CHEST PAIN 03/02/2010  . Arthropathia 08/06/2009  . Bipolar I disorder, single manic episode ([place]) 08/06/2009  . Chronic pain associated with significant psychosocial dysfunction 08/06/2009  . Clinical depression 08/06/2009  . Adiposity 08/06/2009  . Essential (primary) hypertension 08/06/2009  . Adult hypothyroidism 08/06/2009    Orientation RESPIRATION BLADDER Height & Weight     Self  Normal External catheter(placed 9/6) Weight: 186 lb 8.2 oz (84.6 kg) Height:  5\' 1"  (154.9 cm)  BEHAVIORAL SYMPTOMS/MOOD NEUROLOGICAL BOWEL NUTRITION STATUS      Continent Diet(Carb modified)  AMBULATORY STATUS COMMUNICATION OF NEEDS Skin   Total Care(Patient unable to ambulate at tihs time due to right Femoral fracture) Verbally Surgical wounds, Wound Vac(Closed incision  right leg, closed incision left brrast, closed incision left axilla; Wound vac placed  on right lateral leg)                       Personal Care Assistance Level of Assistance  Bathing, Feeding, Dressing Bathing Assistance: Maximum assistance Feeding assistance: Limited assistance Dressing Assistance: Maximum assistance     Functional Limitations Info  Sight, Hearing, Speech Sight Info: Impaired(wears glasses) Hearing Info: Impaired(wears hearring aides) Speech Info: Adequate    SPECIAL CARE FACTORS FREQUENCY  PT (By licensed PT), OT (By licensed OT)     PT Frequency: Evaluated 9/7 at hospital. PT at [facility] Eval and Treat, a minimum of 5 days per week OT Frequency: Evaluated 0/8 at hospital. OT at SNF Eval and Treat, a minimum of 5 days per week            Contractures Contractures Info: Not present    Additional Factors Info  Code Status, Allergies, Insulin Sliding Scale Code Status Info: DNR Allergies Info: Clindamycin Hcl   Insulin Sliding Scale Info: 0-5 Units daily at bedtime; 0-9 Units 3 times per day with meals       Current Medications (07/30/2019):  This is the current hospital active medication list Current Facility-Administered Medications  Medication Dose Route Frequency Provider Last Rate Last Dose  . 0.9 %  sodium chloride infusion (Manually program via Guardrails IV Fluids)   Intravenous Once Caren Griffins, MD      . apixaban Arne Cleveland) tablet 2.5 mg  2.5 mg Oral BID Caren Griffins, MD   2.5 mg at 07/30/19 1006  . bisacodyl (DULCOLAX) EC tablet 5 mg  5 mg Oral Daily PRN Swinteck, Aaron Edelman, MD      . docusate sodium (COLACE) capsule 100 mg  100 mg Oral BID Rod Can, MD   100 mg at 07/30/19 1007  . DULoxetine (CYMBALTA) DR capsule 60 mg  60 mg Oral Daily Wendee Beavers T, MD   60 mg at 07/30/19 1329  . [START ON 07/31/2019] ferumoxytol (FERAHEME) 510 mg in sodium chloride 0.9 % 100 mL IVPB  510 mg Intravenous Once Gonfa, Taye T, MD      . insulin aspart (novoLOG) injection 0-5 Units  0-5 Units Subcutaneous QHS Swinteck, Aaron Edelman, MD       . insulin aspart (novoLOG) injection 0-9 Units  0-9 Units Subcutaneous TID WC Rod Can, MD   1 Units at 07/30/19 1213  . levothyroxine (SYNTHROID) tablet 50 mcg  50 mcg Oral Q0600 Caren Griffins, MD   50 mcg at 07/30/19 0522  . menthol-cetylpyridinium (CEPACOL) lozenge 3 mg  1 lozenge Oral PRN Swinteck, Aaron Edelman, MD       Or  . phenol (CHLORASEPTIC) mouth spray 1 spray  1 spray Mouth/Throat PRN Swinteck, Aaron Edelman, MD      . metoCLOPramide (REGLAN) tablet 5-10 mg  5-10 mg Oral Q8H PRN Swinteck, Aaron Edelman, MD       Or  . metoCLOPramide (REGLAN) injection 5-10 mg  5-10 mg Intravenous Q8H PRN Swinteck, Aaron Edelman, MD      . mirtazapine (REMERON) tablet 7.5 mg  7.5 mg Oral QHS Caren Griffins, MD   7.5 mg at 07/29/19 2108  . morphine 2 MG/ML injection 2 mg  2 mg Intravenous Q3H PRN Rod Can, MD   2 mg at 07/25/19 2334  . nicotine (NICODERM CQ - dosed in mg/24 hr) patch 7 mg  7 mg Transdermal Daily Rod Can, MD      .  ondansetron (ZOFRAN) tablet 4 mg  4 mg Oral Q6H PRN Swinteck, Aaron Edelman, MD       Or  . ondansetron (ZOFRAN) injection 4 mg  4 mg Intravenous Q6H PRN Rod Can, MD   4 mg at 07/30/19 1214  . oxybutynin (DITROPAN-XL) 24 hr tablet 10 mg  10 mg Oral Daily Caren Griffins, MD   10 mg at 07/30/19 1007  . oxyCODONE (Oxy IR/ROXICODONE) immediate release tablet 5 mg  5 mg Oral Q4H PRN Rod Can, MD   5 mg at 07/25/19 2131  . senna-docusate (Senokot-S) tablet 1 tablet  1 tablet Oral QHS PRN Rod Can, MD   1 tablet at 07/25/19 1532  . sodium phosphate (FLEET) 7-19 GM/118ML enema 1 enema  1 enema Rectal Once PRN Swinteck, Aaron Edelman, MD      . vitamin B-12 (CYANOCOBALAMIN) tablet 500 mcg  500 mcg Oral Daily Marzetta Board M, MD   500 mcg at 07/30/19 1007  . zolpidem (AMBIEN) tablet 5 mg  5 mg Oral QHS PRN Rod Can, MD   5 mg at 07/25/19 2131     Discharge Medications: Please see discharge summary for a list of discharge medications.  Relevant Imaging  Results:  Relevant Lab Results:   Additional Information s#[id number].  Sable Feil, LCSW

## 2019-07-30 NOTE — Care Management (Signed)
CM consult acknowledged for HH/DME arrangement. PT/OT eval complete with SNF vs home with 24hr supervision/assistance recommended. CM team will continue to follow and will discuss the POC with the patient and her family.  Midge Minium RN, BSN, NCM-BC, ACM-RN [phone]

## 2019-07-30 NOTE — Progress Notes (Addendum)
Physical Therapy Treatment Patient Details Name: Andrea [name] MRN: [id number] DOB: 1935/11/06 Today's Date: 07/30/2019    History of Present Illness Andrea [name] is a 84 y.o. female with history of A. fib on Eliquis, remote bilateral TKA, left breast cancer status post lumpectomy and radiation now on chemo, OSA on CPAP, hypothyroidism, overactive bladder, depression/anxiety and DM-2 presenting with mechanical fall at home resulting in distal femur periprosthetic fx, no s/p ORIF, TWB, unlimited ROM knee    PT Comments    Pt performed transfer training into standing at walker x2.  She required mod +2 and max/total assistance +2 to move from bed to bedside commode. Once seated on commode patient went into extensor tone and began to shake uncontrollably.  It appeared seizure like.  Informed RN and assessed BP. Moved patient back to bed without RW and +2 max assistance.  Based on function she continues to require SNF placement.  Plan next session for progression of transfer training to tolerance.  BP post incident 98/63, returned to supine and BP increased 125/54.  RN to report incident to MD.    Follow Up Recommendations  SNF     Equipment Recommendations  (TBD at next venue)    Recommendations for Other Services       Precautions / Restrictions Precautions Precautions: Fall Restrictions Weight Bearing Restrictions: Yes RLE Weight Bearing: Partial weight bearing RLE Partial Weight Bearing Percentage or Pounds: 30% per MD    Mobility  Bed Mobility Overal bed mobility: Needs Assistance Bed Mobility: Supine to Sit;Sit to Supine     Supine to sit: Mod assist;+2 for physical assistance Sit to supine: Max assist;+2 for physical assistance   General bed mobility comments: Pt performed mobility to move to edge of bed with assistance +2.  Pt required assistance to move LEs to edge of bed and elevate trunk into sitting.  Pt required increased assistance to return to supine after  seizure like episode sitting on Bedside commode.  Transfers Overall transfer level: Needs assistance Equipment used: Rolling walker (2 wheeled);None Transfers: Sit to/from [facility] Sit to Stand: Mod assist;+2 safety/equipment;From elevated surface(x2 on 3rd attempt required max +2 with out RW to return to bed after seizure like episode.) Stand pivot transfers: Max assist;Total assist;+2 physical assistance       General transfer comment: Pt performed sit to stand from bed x 2 trials from elevated surface height.  Pt required cues for hand placement and assistance to boost into standing.  during 2nd trial assisted patient to commode. After about 30 sec on commode she presented in extensor tone and started to shake.  Called RN and assessed BP.  Ambulation/Gait                 Stairs             Wheelchair Mobility    Modified Rankin (Stroke Patients Only)       Balance     Sitting balance-[name]: Poor       Standing balance-[name]: Poor                              Cognition Arousal/Alertness: Awake/alert Behavior During Therapy: WFL for tasks assessed/performed Overall Cognitive Status: Impaired/Different from baseline                                 General  Comments: Caregiver reports patient is different from baseline.      Exercises      General Comments        Pertinent Vitals/Pain Pain Assessment: 0-10 Pain Score: 7  Pain Location: R hip Pain Descriptors / Indicators: Grimacing;Guarding;Moaning Pain Intervention(s): Monitored during session;Repositioned    Home Living                      Prior Function            PT Goals (current goals can now be found in the care plan section) Acute Rehab PT Goals Patient Stated Goal: pt unable to state Potential to Achieve Goals: Fair Progress towards PT goals: Progressing toward goals    Frequency    Min 3X/week      PT  Plan Current plan remains appropriate    Co-evaluation              AM-PAC PT "6 Clicks" Mobility   Outcome Measure  Help needed turning from your back to your side while in a flat bed without using bedrails?: A Lot Help needed moving from lying on your back to sitting on the side of a flat bed without using bedrails?: A Lot Help needed moving to and from a bed to a chair (including a wheelchair)?: Total Help needed standing up from a chair using your arms (e.g., wheelchair or bedside chair)?: A Lot Help needed to walk in hospital room?: Total Help needed climbing 3-5 steps with a railing? : Total 6 Click Score: 9    End of Session Equipment Utilized During Treatment: Gait belt Activity Tolerance: Patient limited by fatigue;Patient limited by pain Patient left: in bed;with bed alarm set;with call bell/phone within reach Nurse Communication: Mobility status PT Visit Diagnosis: Other abnormalities of gait and mobility (R26.89);Pain;Unsteadiness on feet (R26.81) Pain - Right/Left: Right Pain - part of body: Leg     Time: 1355-1420 PT Time Calculation (min) (ACUTE ONLY): 25 min  Charges:  $Therapeutic Activity: 23-37 mins                     Governor Rooks, PTA Acute Rehabilitation Services Pager [phone] Office [phone]     Andrea [name] 07/30/2019, 4:34 PM

## 2019-07-30 NOTE — Progress Notes (Signed)
Paged to patient's room r/t patient having episode of low BP and dizziness.  Patient assisted back to bed, vital signs reassessed, Dr. Cyndia Skeeters sent secure message, new orders received.

## 2019-07-31 ENCOUNTER — Inpatient Hospital Stay (HOSPITAL_COMMUNITY): Payer: Medicare Other

## 2019-07-31 ENCOUNTER — Other Ambulatory Visit: Payer: Self-pay | Admitting: *Deleted

## 2019-07-31 DIAGNOSIS — D72825 Bandemia: Secondary | ICD-10-CM

## 2019-07-31 DIAGNOSIS — R509 Fever, unspecified: Secondary | ICD-10-CM

## 2019-07-31 DIAGNOSIS — R1030 Lower abdominal pain, unspecified: Secondary | ICD-10-CM

## 2019-07-31 DIAGNOSIS — Z5181 Encounter for therapeutic drug level monitoring: Secondary | ICD-10-CM

## 2019-07-31 LAB — CBC WITH DIFFERENTIAL/PLATELET
Abs Immature Granulocytes: 0.06 10*3/uL (ref 0.00–0.07)
Basophils Absolute: 0 10*3/uL (ref 0.0–0.1)
Basophils Relative: 0 %
Eosinophils Absolute: 0 10*3/uL (ref 0.0–0.5)
Eosinophils Relative: 0 %
HCT: 26.2 % — ABNORMAL LOW (ref 36.0–46.0)
Hemoglobin: 8.6 g/dL — ABNORMAL LOW (ref 12.0–15.0)
Immature Granulocytes: 0 %
Lymphocytes Relative: 9 %
Lymphs Abs: 1.3 10*3/uL (ref 0.7–4.0)
MCH: 29.2 pg (ref 26.0–34.0)
MCHC: 32.8 g/dL (ref 30.0–36.0)
MCV: 88.8 fL (ref 80.0–100.0)
Monocytes Absolute: 1.1 10*3/uL — ABNORMAL HIGH (ref 0.1–1.0)
Monocytes Relative: 8 %
Neutro Abs: 11.9 10*3/uL — ABNORMAL HIGH (ref 1.7–7.7)
Neutrophils Relative %: 83 %
Platelets: 264 10*3/uL (ref 150–400)
RBC: 2.95 MIL/uL — ABNORMAL LOW (ref 3.87–5.11)
RDW: 14.8 % (ref 11.5–15.5)
WBC: 14.5 10*3/uL — ABNORMAL HIGH (ref 4.0–10.5)
nRBC: 0.1 % (ref 0.0–0.2)

## 2019-07-31 LAB — COMPREHENSIVE METABOLIC PANEL
ALT: 26 U/L (ref 0–44)
AST: 24 U/L (ref 15–41)
Albumin: 2.8 g/dL — ABNORMAL LOW (ref 3.5–5.0)
Alkaline Phosphatase: 56 U/L (ref 38–126)
Anion gap: 10 (ref 5–15)
BUN: 24 mg/dL — ABNORMAL HIGH (ref 8–23)
CO2: 23 mmol/L (ref 22–32)
Calcium: 8.2 mg/dL — ABNORMAL LOW (ref 8.9–10.3)
Chloride: 99 mmol/L (ref 98–111)
Creatinine, Ser: 0.71 mg/dL (ref 0.44–1.00)
GFR calc Af Amer: >60 mL/min (ref >60)
GFR calc non Af Amer: >60 mL/min (ref >60)
Glucose, Bld: 125 mg/dL — ABNORMAL HIGH (ref 70–99)
Potassium: 3.8 mmol/L (ref 3.5–5.1)
Sodium: 132 mmol/L — ABNORMAL LOW (ref 135–145)
Total Bilirubin: 0.8 mg/dL (ref 0.3–1.2)
Total Protein: 5.4 g/dL — ABNORMAL LOW (ref 6.5–8.1)

## 2019-07-31 LAB — URINALYSIS, ROUTINE W REFLEX MICROSCOPIC
Bilirubin Urine: NEGATIVE
Glucose, UA: NEGATIVE mg/dL
Ketones, ur: 20 mg/dL — AB
Nitrite: NEGATIVE
Protein, ur: 30 mg/dL — AB
Specific Gravity, Urine: 1.024 (ref 1.005–1.030)
WBC, UA: >50 WBC/hpf — ABNORMAL HIGH (ref 0–5)
pH: 6 (ref 5.0–8.0)

## 2019-07-31 LAB — GLUCOSE, CAPILLARY
Glucose-Capillary: 131 mg/dL — ABNORMAL HIGH (ref 70–99)
Glucose-Capillary: 135 mg/dL — ABNORMAL HIGH (ref 70–99)
Glucose-Capillary: 114 mg/dL — ABNORMAL HIGH (ref 70–99)
Glucose-Capillary: 113 mg/dL — ABNORMAL HIGH (ref 70–99)

## 2019-07-31 LAB — HEMOGLOBIN AND HEMATOCRIT, BLOOD
HCT: 26.2 % — ABNORMAL LOW (ref 36.0–46.0)
Hemoglobin: 8.5 g/dL — ABNORMAL LOW (ref 12.0–15.0)

## 2019-07-31 LAB — MAGNESIUM: Magnesium: 1.9 mg/dL (ref 1.7–2.4)

## 2019-07-31 LAB — PHOSPHORUS: Phosphorus: 2.5 mg/dL (ref 2.5–4.6)

## 2019-07-31 LAB — SARS CORONAVIRUS 2 (TAT 6-24 HRS): SARS Coronavirus 2: NEGATIVE

## 2019-07-31 MED ORDER — IOHEXOL 300 MG/ML  SOLN
100.0000 mL | Freq: Once | INTRAMUSCULAR | Status: AC | PRN
  Administered 2019-07-31: 100 mL via INTRAVENOUS

## 2019-07-31 MED ORDER — FERROUS SULFATE 325 (65 FE) MG PO TABS
325.0000 mg | ORAL_TABLET | Freq: Two times a day (BID) | ORAL | Status: DC
  Administered 2019-07-31 – 2019-08-06 (×13): 325 mg via ORAL
  Filled 2019-07-31 (×13): qty 1

## 2019-07-31 MED ORDER — MORPHINE SULFATE (PF) 2 MG/ML IV SOLN: 2.0000 mg | INTRAVENOUS | Status: DC | PRN

## 2019-07-31 MED ORDER — SODIUM CHLORIDE 0.9 % IV SOLN
1.0000 g | INTRAVENOUS | Status: DC
  Administered 2019-07-31 – 2019-08-03 (×4): 1 g via INTRAVENOUS
  Filled 2019-07-31 (×4): qty 10

## 2019-07-31 MED ORDER — APIXABAN 5 MG PO TABS
5.0000 mg | ORAL_TABLET | Freq: Two times a day (BID) | ORAL | Status: DC
  Administered 2019-07-31 – 2019-08-01 (×3): 5 mg via ORAL
  Filled 2019-07-31 (×3): qty 1

## 2019-07-31 MED ORDER — SODIUM CHLORIDE 0.9 % IV SOLN
INTRAVENOUS | Status: AC
  Administered 2019-07-31: 17:00:00 via INTRAVENOUS

## 2019-07-31 MED ORDER — ACETAMINOPHEN 325 MG PO TABS
650.0000 mg | ORAL_TABLET | Freq: Four times a day (QID) | ORAL | Status: DC | PRN
  Administered 2019-07-31 – 2019-08-06 (×5): 650 mg via ORAL
  Filled 2019-07-31 (×5): qty 2

## 2019-07-31 NOTE — Progress Notes (Signed)
PROGRESS NOTE  Andrea [name] [id number] DOB: Sep 13, 1935   PCP: Gayland Curry, DO  Patient is from: home  DOA: 07/25/2019 LOS: 6  Brief Narrative / Interim history: 84 y.o.femalewith history ofA. fib on Eliquis, remote bilateral TKA, left breast cancer status post lumpectomy and radiation now on chemo, OSA on CPAP, hypothyroidism, overactive bladder, depression/anxiety and DM-2 presenting with mechanical fall at home and acute displaced spiral fracture of the distal femoral metadiaphysis.  She underwent open reduction internal fixation of right femoral with application of wound VAC on 07/26/2019 by Dr. Lyla Glassing.  Postop course complicated by acute encephalopathy/drowsiness without focal neuro deficit.  Work-up including CT head and urinalysis were negative.  She has no elevated bicarb to suggest CO2 retention.  She she was on low-dose Remeron but not taking pain medications.   Patient had abdominal pain, fever to 100.3 (geriatric) and leukocytosis to 14.5 with bandemia on 9/10.  She continues to look drowsy and weak.  Had 4 BMs charted over the last 24 hours but has been receiving Colace.  Obtained UA, urine culture, chest x-ray and CT abdomen and pelvis.   Subjective: No major events overnight.  Febrile to 100.3 (geriatric) this morning.  Reports low abdominal pain.  Denies chest pain, shortness of breath and problem with urination.  However, she is not a reliable historian.  She is oriented to self, partial place and family name only.  She also looks weak with flat affect.  Objective: Vitals:   07/30/19 2037 07/31/19 0500 07/31/19 0508 07/31/19 0731  BP: (!) 116/51  (!) 143/67 (!) 138/58  Pulse: 82  79 79  Resp:   14 16  Temp:   98.7 F (37.1 C) 100.3 F (37.9 C)  TempSrc:   Oral Oral  SpO2: 97%  97% 99%  Weight:  87.8 kg    Height:        Intake/Output Summary (Last 24 hours) at 07/31/2019 1201 Last data filed at 07/31/2019 0900 Gross per 24 hour  Intake 240 ml   Output 1400 ml  Net -1160 ml   Filed Weights   07/28/19 0500 07/29/19 0500 07/31/19 0500  Weight: 92.2 kg 84.6 kg 87.8 kg    Examination:  GENERAL: No acute distress.  Looks weak.  Flat affect. HEENT: MMM.  Vision and hearing grossly intact.  NECK: Supple.  No apparent JVD.  RESP: On RA.  No IWOB.  Fair air movement bilaterally. CVS: IR.  Normal rate. Heart sounds normal.  ABD/GI/GU: Bowel sounds present. Soft.  Mild tenderness across lower abdomen. MSK/EXT:  Moves extremities. No apparent deformity or edema.  SKIN: Wound VAC over RLE.  No surrounding skin erythema or signs of infection. NEURO: Awake, alert and oriented self, partial place and family name only.  Moves all extremities symmetrically.  No focal neuro deficit. PSYCH: Calm but flat affect.  Assessment & Plan: Periprosthetic displaced right distal femoral fracture -ORIF right femur, wound VAC in place, touchdown weightbearing on the right lower extremity.  -Increase Eliquis to 5 mg twice daily. -Praveena wound VAC on discharge -PT recommending SNF for ongoing PT/OT.  Acute metabolic encephalopathy/drowsiness: Noted to be less interactive since 9/8.  Only oriented to self, partial place and family name.  Now with fever, leukocytosis and lower abdominal pain concerning for infectious process.  She also have flat affect concerning for major depression.  Delirium is a possibility as well.  No focal neuro deficit.  Adjustments were made to his psych medications on 9/8.  No  history of dementia.  Reportedly wide awake alert and interactive at baseline per family. Some increased frequency of BMs but on Colace.  CT head without acute intracranial finding.  Urinalysis normal.  No hypercarbia on BMP.She has no fever or respiratory symptoms to suggest infectious process.  TSH and vitamin B12 normal. -We will obtain repeat UA, urine culture, CT abdomen and pelvis and portable chest x-ray -Will consider testing for C. difficile if  continues to have frequent bowel movement after stopping Colace.  Paroxysmal A. fib, currently in sinus rhythm -Continue Eliquis as above -Not on rate or rhythm control  Acute blood loss anemia: Anemia panel consistent with iron deficiency. -Hgb dropped to 6.8 on 9/6 postop.  Transfused 1 unit and up to 8.5 and remained stable. -IV Feraheme today.  Hypothyroidism: TSH within normal. -Continue Synthroid  OSA on CPAP -Continue nightly CPAP  Left breast cancer status post lumpectomy and radiation, now on chemotherapy -Followed by Dr. Lindi Adie as an outpatient  Tobacco use -Nicotine patch  Anxiety/depression -Reduced home Cymbalta to 60 mg although this is activating.  -Continue home low-dose Remeron. -Hold home trazodone.  Nutrition: Reportedly poor p.o. intake per RN. -Consult nutrition. -Check prealbumin level.  DVT prophylaxis: On Eliquis Code Status: DNR/DNI Family Communication: Patient and/or RN. Available if any question. Disposition Plan: Remains inpatient for further work-up.  Patient has new fever, leukocytosis and significant encephalopathy Consultants: Orthopedic surgery  Procedures:  9/5-ORIF by Dr. Lyla Glassing. MRSA PCR negative.  Microbiology summarized: [id number] negative.  Antimicrobials: Anti-infectives (From admission, onward)   Start     Dose/Rate Route Frequency Ordered Stop   07/26/19 1430  ceFAZolin (ANCEF) IVPB 2g/100 mL premix     2 g 200 mL/hr over 30 Minutes Intravenous Every 6 hours 07/26/19 1353 07/26/19 2335   07/26/19 0645  ceFAZolin (ANCEF) IVPB 2g/100 mL premix     2 g 200 mL/hr over 30 Minutes Intravenous On call to O.R. 07/26/19 [id number] 07/26/19 0903   07/26/19 0641  ceFAZolin (ANCEF) 2-4 GM/100ML-% IVPB    Note to Pharmacy: Henrine Screws   : cabinet override      07/26/19 0641 07/26/19 0833      Sch Meds:  Scheduled Meds: . sodium chloride   Intravenous Once  . apixaban  2.5 mg Oral BID  . DULoxetine  60 mg Oral Daily  .  ferrous sulfate  325 mg Oral BID WC  . insulin aspart  0-5 Units Subcutaneous QHS  . insulin aspart  0-9 Units Subcutaneous TID WC  . levothyroxine  50 mcg Oral Q0600  . mirtazapine  7.5 mg Oral QHS  . nicotine  7 mg Transdermal Daily  . oxybutynin  10 mg Oral Daily  . vitamin B-12  500 mcg Oral Daily   Continuous Infusions: PRN Meds:.acetaminophen, bisacodyl, menthol-cetylpyridinium **OR** phenol, metoCLOPramide **OR** metoCLOPramide (REGLAN) injection, morphine injection, ondansetron **OR** ondansetron (ZOFRAN) IV, oxyCODONE, senna-docusate, sodium phosphate, zolpidem   I have personally reviewed the following labs and images: CBC: Recent Labs  Lab 07/25/19 0734  07/27/19 0727  07/28/19 0430 07/29/19 0348 07/30/19 0428 07/31/19 0425 07/31/19 0905  WBC 12.2*   < > 12.1*  --  10.8* 11.0* 9.1  --  14.5*  NEUTROABS 10.8*  --   --   --   --   --   --   --  11.9*  HGB 11.9*   < > 6.8*   < > 8.3* 8.5* 8.4* 8.5* 8.6*  HCT 37.2   < > 20.4*   < >  24.5* 24.8* 24.5* 26.2* 26.2*  MCV 91.2   < > 88.3  --  86.6 85.2 86.3  --  88.8  PLT 197   < > 177  --  168 171 201  --  264   < > = values in this interval not displayed.   BMP &GFR Recent Labs  Lab 07/27/19 0727 07/28/19 0430 07/29/19 0348 07/30/19 0428 07/31/19 0425  NA 135 132* 131* 132* 132*  K 3.8 3.5 3.3* 3.7 3.8  CL 100 98 97* 99 99  CO2 25 24 25 23 23   GLUCOSE 144* 123* 135* 118* 125*  BUN 25* 19 17 20  24*  CREATININE 0.75 0.63 0.60 0.67 0.71  CALCIUM 8.5* 8.2* 8.3* 8.2* 8.2*  MG  --   --   --   --  1.9  PHOS  --   --   --   --  2.5   Estimated Creatinine Clearance: 54.6 mL/min (by C-G formula based on SCr of 0.71 mg/dL). Liver & Pancreas: Recent Labs  Lab 07/25/19 0734 07/30/19 0428 07/31/19 0425  AST 17 28 24   ALT 13 28 26   ALKPHOS 58 51 56  BILITOT 0.6 1.2 0.8  PROT 6.2* 5.3* 5.4*  ALBUMIN 3.4* 2.8* 2.8*   No results for input(s): LIPASE, AMYLASE in the last 168 hours. No results for input(s): AMMONIA in  the last 168 hours. Diabetic: No results for input(s): HGBA1C in the last 72 hours. Recent Labs  Lab 07/30/19 1147 07/30/19 1616 07/30/19 2109 07/31/19 0706 07/31/19 1143  GLUCAP 132* 114* 139* 131* 135*   Cardiac Enzymes: No results for input(s): CKTOTAL, CKMB, CKMBINDEX, TROPONINI in the last 168 hours. No results for input(s): PROBNP in the last 8760 hours. Coagulation Profile: Recent Labs  Lab 07/25/19 0734  INR 1.2   Thyroid Function Tests: Recent Labs    07/30/19 1438  TSH 1.414   Lipid Profile: No results for input(s): CHOL, HDL, LDLCALC, TRIG, CHOLHDL, LDLDIRECT in the last 72 hours. Anemia Panel: Recent Labs    07/30/19 1438  VITAMINB12 2,198*  FOLATE 23.8  FERRITIN 40  TIBC 249*  IRON 22*  RETICCTPCT 5.0*   Urine analysis:    Component Value Date/Time   COLORURINE YELLOW 07/29/2019 1350   APPEARANCEUR CLEAR 07/29/2019 1350   APPEARANCEUR Cloudy (A) 09/22/2014 1015   LABSPEC 1.015 07/29/2019 1350   PHURINE 6.0 07/29/2019 1350   GLUCOSEU NEGATIVE 07/29/2019 1350   HGBUR SMALL (A) 07/29/2019 1350   BILIRUBINUR NEGATIVE 07/29/2019 1350   BILIRUBINUR neg 09/06/2018 1110   BILIRUBINUR Negative 09/22/2014 1015   KETONESUR 20 (A) 07/29/2019 1350   PROTEINUR NEGATIVE 07/29/2019 1350   UROBILINOGEN 0.2 09/06/2018 1110   UROBILINOGEN 0.2 03/11/2013 2003   NITRITE NEGATIVE 07/29/2019 1350   LEUKOCYTESUR NEGATIVE 07/29/2019 1350   Sepsis Labs: Invalid input(s): PROCALCITONIN, [place]  Microbiology: Recent Results (from the past 240 hour(s))  SARS Coronavirus 2 [facility] order, Performed in [facility] hospital lab) Nasopharyngeal Nasopharyngeal Swab     Status: None   Collection Time: 07/25/19  7:39 AM   Specimen: Nasopharyngeal Swab  Result Value Ref Range Status   SARS Coronavirus 2 NEGATIVE NEGATIVE Final    Comment: (NOTE) If result is NEGATIVE SARS-CoV-2 target nucleic acids are NOT DETECTED. The SARS-CoV-2 RNA is generally detectable in  upper and lower  respiratory specimens during the acute phase of infection. The lowest  concentration of SARS-CoV-2 viral copies this assay can detect is 250  copies / mL. A negative  result does not preclude SARS-CoV-2 infection  and should not be used as the sole basis for treatment or other  patient management decisions.  A negative result may occur with  improper specimen collection / handling, submission of specimen other  than nasopharyngeal swab, presence of viral mutation(s) within the  areas targeted by this assay, and inadequate number of viral copies  (<250 copies / mL). A negative result must be combined with clinical  observations, patient history, and epidemiological information. If result is POSITIVE SARS-CoV-2 target nucleic acids are DETECTED. The SARS-CoV-2 RNA is generally detectable in upper and lower  respiratory specimens dur ing the acute phase of infection.  Positive  results are indicative of active infection with SARS-CoV-2.  Clinical  correlation with patient history and other diagnostic information is  necessary to determine patient infection status.  Positive results do  not rule out bacterial infection or co-infection with other viruses. If result is PRESUMPTIVE POSTIVE SARS-CoV-2 nucleic acids MAY BE PRESENT.   A presumptive positive result was obtained on the submitted specimen  and confirmed on repeat testing.  While 2019 novel coronavirus  (SARS-CoV-2) nucleic acids may be present in the submitted sample  additional confirmatory testing may be necessary for epidemiological  and / or clinical management purposes  to differentiate between  SARS-CoV-2 and other Sarbecovirus currently known to infect humans.  If clinically indicated additional testing with an alternate test  methodology [phone]) is advised. The SARS-CoV-2 RNA is generally  detectable in upper and lower respiratory sp ecimens during the acute  phase of infection. The expected result is  Negative. Fact Sheet for Patients:  StrictlyIdeas.no Fact Sheet for Healthcare Providers: BankingDealers.co.za This test is not yet approved or cleared by the [place] FDA and has been authorized for detection and/or diagnosis of SARS-CoV-2 by FDA under an Emergency Use Authorization (EUA).  This EUA will remain in effect (meaning this test can be used) for the duration of the COVID-19 declaration under Section 564(b)(1) of the Act, 21 U.S.C. section 360bbb-3(b)(1), unless the authorization is terminated or revoked sooner. Performed at [facility], [place] [address] [place], [place] [address]   Surgical PCR screen     Status: None   Collection Time: 07/25/19  5:34 PM   Specimen: Nasal Mucosa; Nasal Swab  Result Value Ref Range Status   MRSA, PCR NEGATIVE NEGATIVE Final   Staphylococcus aureus NEGATIVE NEGATIVE Final    Comment: (NOTE) The Xpert SA Assay (FDA approved for NASAL specimens in patients 29 years of age and older), is one component of a comprehensive surveillance program. It is not intended to diagnose infection nor to guide or monitor treatment. Performed at [facility], [place] [address], [place], [place] [address]   SARS CORONAVIRUS 2 (TAT 6-24 HRS) Nasopharyngeal Nasopharyngeal Swab     Status: None   Collection Time: 07/25/19  6:23 PM   Specimen: Nasopharyngeal Swab  Result Value Ref Range Status   SARS Coronavirus 2 NEGATIVE NEGATIVE Final    Comment: (NOTE) SARS-CoV-2 target nucleic acids are NOT DETECTED. The SARS-CoV-2 RNA is generally detectable in upper and lower respiratory specimens during the acute phase of infection. Negative results do not preclude SARS-CoV-2 infection, do not rule out co-infections with other pathogens, and should not be used as the sole basis for treatment or other patient management decisions. Negative results must be combined with clinical observations,  patient history, and epidemiological information. The expected result is Negative. Fact Sheet for Patients: SugarRoll.be  Fact Sheet for Healthcare Providers: https://www.woods-mathews.com/ This test is not yet approved or cleared by the [place] FDA and  has been authorized for detection and/or diagnosis of SARS-CoV-2 by FDA under an Emergency Use Authorization (EUA). This EUA will remain  in effect (meaning this test can be used) for the duration of the COVID-19 declaration under Section 56 4(b)(1) of the Act, 21 U.S.C. section 360bbb-3(b)(1), unless the authorization is terminated or revoked sooner. Performed at [facility], [place] [address], [place], [place] [address]     Radiology Studies: No results found.  Joangel Vanosdol T. [place]  If 7PM-7AM, please contact night-coverage [email or web address] Password [email or web address] 07/31/2019, 12:01 PM

## 2019-07-31 NOTE — Progress Notes (Signed)
Attempted to call patient's daughter over the phone for update.  No answer.  Automatic response  "we are not able to complete you call at this time".

## 2019-07-31 NOTE — TOC Progression Note (Addendum)
Transition of Care [facility]) - Progression Note    Patient Details  Name: Andrea [name] MRN: [id number] Date of Birth: 1935-07-13  Transition of [facility] CM/SW Contact  Sharlet Salina Mila Homer, LCSW Phone Number: 07/31/2019, 5:16 PM  Clinical Narrative:   Daughter, Mykaylah Custodio [phone]) provided with bed offers and chose [facility]. Narda Rutherford, admissions director contacted and can take patient, however just has one bed available tomorrow. MD contacted via secure chat regarding patient's readiness for discharge and he indicated that he is not sure if she will d/c tomorrow as patient will have a CT of abdomen and will be started on abx tomorrow. Admissions director informed. Contacted daughter and provided update and that she may have to select another facility if her mom does not discharge on Friday. Ms. Ebright expressed understanding.    Expected Discharge Plan: Skilled Nursing Facility Barriers to Discharge: Continued Medical Work up  Expected Discharge Plan and Services Expected Discharge Plan: [place] In-house Referral: Clinical Social Work   Post Acute Care Choice: [place]   Expected Discharge Date: 07/30/19                                    Social Determinants of Health (SDOH) Interventions    Readmission Risk Interventions No flowsheet data found.

## 2019-07-31 NOTE — Progress Notes (Signed)
Pt going down for CT.

## 2019-07-31 NOTE — Progress Notes (Signed)
Pt not wearing CPAP. 

## 2019-08-01 ENCOUNTER — Other Ambulatory Visit: Payer: Medicare Other

## 2019-08-01 ENCOUNTER — Ambulatory Visit: Payer: Medicare Other

## 2019-08-01 ENCOUNTER — Other Ambulatory Visit (HOSPITAL_COMMUNITY): Payer: Medicare Other

## 2019-08-01 ENCOUNTER — Ambulatory Visit: Payer: Medicare Other | Admitting: Hematology and Oncology

## 2019-08-01 DIAGNOSIS — I951 Orthostatic hypotension: Secondary | ICD-10-CM

## 2019-08-01 DIAGNOSIS — N390 Urinary tract infection, site not specified: Secondary | ICD-10-CM

## 2019-08-01 DIAGNOSIS — E860 Dehydration: Secondary | ICD-10-CM

## 2019-08-01 LAB — CBC
HCT: 24.6 % — ABNORMAL LOW (ref 36.0–46.0)
Hemoglobin: 8.1 g/dL — ABNORMAL LOW (ref 12.0–15.0)
MCH: 29 pg (ref 26.0–34.0)
MCHC: 32.9 g/dL (ref 30.0–36.0)
MCV: 88.2 fL (ref 80.0–100.0)
Platelets: 269 10*3/uL (ref 150–400)
RBC: 2.79 MIL/uL — ABNORMAL LOW (ref 3.87–5.11)
RDW: 14.8 % (ref 11.5–15.5)
WBC: 9.4 10*3/uL (ref 4.0–10.5)
nRBC: 0 % (ref 0.0–0.2)

## 2019-08-01 LAB — URINE CULTURE: Culture: 80000 — AB

## 2019-08-01 LAB — BASIC METABOLIC PANEL
Anion gap: 9 (ref 5–15)
BUN: 16 mg/dL (ref 8–23)
CO2: 24 mmol/L (ref 22–32)
Calcium: 8.2 mg/dL — ABNORMAL LOW (ref 8.9–10.3)
Chloride: 102 mmol/L (ref 98–111)
Creatinine, Ser: 0.72 mg/dL (ref 0.44–1.00)
GFR calc Af Amer: >60 mL/min (ref >60)
GFR calc non Af Amer: >60 mL/min (ref >60)
Glucose, Bld: 95 mg/dL (ref 70–99)
Potassium: 3.7 mmol/L (ref 3.5–5.1)
Sodium: 135 mmol/L (ref 135–145)

## 2019-08-01 LAB — GLUCOSE, CAPILLARY
Glucose-Capillary: 96 mg/dL (ref 70–99)
Glucose-Capillary: 103 mg/dL — ABNORMAL HIGH (ref 70–99)
Glucose-Capillary: 124 mg/dL — ABNORMAL HIGH (ref 70–99)
Glucose-Capillary: 106 mg/dL — ABNORMAL HIGH (ref 70–99)

## 2019-08-01 LAB — MAGNESIUM: Magnesium: 1.9 mg/dL (ref 1.7–2.4)

## 2019-08-01 MED ORDER — PHENYLEPHRINE HCL-NACL 10-0.9 MG/250ML-% IV SOLN
INTRAVENOUS | Status: AC
  Filled 2019-08-01: qty 500

## 2019-08-01 NOTE — Plan of Care (Signed)
No complaints of abd pain this AM, CT abd completed.

## 2019-08-01 NOTE — Progress Notes (Signed)
PROGRESS NOTE  Andrea [name] [id number] DOB: 03-04-1935   PCP: Gayland Curry, DO  Patient is from: home  DOA: 07/25/2019 LOS: 7  Brief Narrative / Interim history: 84 y.o.femalewith history ofA. fib on Eliquis, remote bilateral TKA, left breast cancer status post lumpectomy and radiation now on chemo, OSA on CPAP, hypothyroidism, overactive bladder, depression/anxiety and DM-2 presenting with mechanical fall at home and acute displaced spiral fracture of the distal femoral metadiaphysis.  She underwent open reduction internal fixation of right femoral with application of wound VAC on 07/26/2019 by Dr. Lyla Glassing.  Postop course complicated by acute encephalopathy/drowsiness without focal neuro deficit.  Work-up including CT head and urinalysis were negative.  She has no elevated bicarb to suggest CO2 retention.  She she was on low-dose Remeron but not taking pain medications.   Patient had abdominal pain, fever to 100.3 (geriatric) and leukocytosis to 14.5 with bandemia on 9/10.  She continues to look drowsy and weak.  Had 4 BMs charted over the last 24 hours but has been receiving Colace.  Obtained UA, urine culture, chest x-ray and CT abdomen and pelvis.   Subjective: No major events overnight.  No further fever.  No complaint this morning.  She denies chest pain, dyspnea, cough, nausea, vomiting, abdominal pain or UTI symptoms.  However, she is slow and only oriented to self and place to rely on a history. Objective: Vitals:   07/31/19 1329 07/31/19 1953 08/01/19 0427 08/01/19 0758  BP: (!) 145/62 (!) 143/64 (!) 147/60 (!) 144/59  Pulse: 84 73 78 75  Resp: 16  15 16   Temp: 98.8 F (37.1 C) 98.8 F (37.1 C) 98.8 F (37.1 C) 98.5 F (36.9 C)  TempSrc: Oral Oral Oral Oral  SpO2: 100% 97% 95% 98%  Weight:      Height:        Intake/Output Summary (Last 24 hours) at 08/01/2019 1247 Last data filed at 08/01/2019 1000 Gross per 24 hour  Intake 1274.58 ml  Output 1800 ml   Net -525.42 ml   Filed Weights   07/28/19 0500 07/29/19 0500 07/31/19 0500  Weight: 92.2 kg 84.6 kg 87.8 kg    Examination:  GENERAL: No acute distress.  Slow and weak HEENT: MMM.  Vision and hearing grossly intact.  NECK: Supple.  No apparent JVD.  RESP:  No IWOB. Good air movement bilaterally. CVS:  RRR. Heart sounds normal.  ABD/GI/GU: Bowel sounds present. Soft. Non tender.  MSK/EXT:  Moves extremities. No apparent deformity or edema.  SKIN: Wound VAC over RLE.  No surrounding skin erythema or swelling. NEURO: Slow and sleepy but arises to voice.  Looks weak.  Follows some command.  Oriented to self and place only.  No gross focal neuro deficit. PSYCH: Calm.  And flat affect.  Assessment & Plan: Periprosthetic displaced right distal femoral fracture -ORIF right femur, wound VAC in place, touchdown weightbearing on the right lower extremity.  -Increase Eliquis to 5 mg twice daily. -Praveena wound VAC on discharge -PT recommending SNF for ongoing PT/OT.  Acute metabolic encephalopathy/drowsiness: noted to be less interactive since 9/8. No history of dementia.  Extensive work-up for encephalopathy with unremarkable CT head, CXR, CT A/P, TSH and vitamin B12.  However, had mild fever, leukocytosis and LABP.  Urinalysis concerning for UTI and dehydration with ketonuria. -IV ceftriaxone on 9/10>>. -Follow cultures -Delirium precautions.  Dehydration: Likely due to poor p.o. intake -Continue IV fluid.  Orthostatic hypotension: Not on BP meds. -IV fluid as above -PT/OT  and TED hose -If no improvement, will consider midodrine  Paroxysmal A. fib, currently in sinus rhythm -Continue Eliquis as above -Not on rate or rhythm control  Acute blood loss anemia: Anemia panel consistent with iron deficiency. -Hgb dropped to 6.8 on 9/6 postop.  Transfused 1 unit and up to 8.5 and remained stable. -IV Feraheme 9/10  Hypothyroidism: TSH within normal. -Continue Synthroid  OSA  on CPAP -Encourage nightly CPAP-reportedly has been refusing.  Left breast cancer status post lumpectomy and radiation, now on chemotherapy -Followed by Dr. Lindi Adie as an outpatient  Tobacco use -Nicotine patch  Anxiety/depression -Reduced home Cymbalta to 60 mg although this is activating.  -Hold home Remeron and trazodone.  Nutrition: Reportedly poor p.o. intake per RN. -Appreciate dietitian input -Check prealbumin level.  DVT prophylaxis: On Eliquis Code Status: DNR/DNI Family Communication: Patient and/or RN. Available if any question. Disposition Plan: Remains inpatient due to encephalopathy, poor p.o. intake, dehydration and orthostatic hypotension.  On IV ceftriaxone and IV fluid. Consultants: Orthopedic surgery  Procedures:  9/5-ORIF by Dr. Lyla Glassing. MRSA PCR negative.  Microbiology summarized: [id number] negative.  Antimicrobials: Anti-infectives (From admission, onward)   Start     Dose/Rate Route Frequency Ordered Stop   07/31/19 1615  cefTRIAXone (ROCEPHIN) 1 g in sodium chloride 0.9 % 100 mL IVPB     1 g 200 mL/hr over 30 Minutes Intravenous Every 24 hours 07/31/19 1600     07/26/19 1430  ceFAZolin (ANCEF) IVPB 2g/100 mL premix     2 g 200 mL/hr over 30 Minutes Intravenous Every 6 hours 07/26/19 1353 07/26/19 2335   07/26/19 0645  ceFAZolin (ANCEF) IVPB 2g/100 mL premix     2 g 200 mL/hr over 30 Minutes Intravenous On call to O.R. 07/26/19 [id number] 07/26/19 0903   07/26/19 0641  ceFAZolin (ANCEF) 2-4 GM/100ML-% IVPB    Note to Pharmacy: Henrine Screws   : cabinet override      07/26/19 0641 07/26/19 0833      Sch Meds:  Scheduled Meds: . apixaban  5 mg Oral BID  . DULoxetine  60 mg Oral Daily  . ferrous sulfate  325 mg Oral BID WC  . insulin aspart  0-5 Units Subcutaneous QHS  . insulin aspart  0-9 Units Subcutaneous TID WC  . levothyroxine  50 mcg Oral Q0600  . mirtazapine  7.5 mg Oral QHS  . nicotine  7 mg Transdermal Daily  . oxybutynin  10 mg Oral  Daily  . vitamin B-12  500 mcg Oral Daily   Continuous Infusions: . sodium chloride Stopped (07/31/19 2121)  . cefTRIAXone (ROCEPHIN)  IV Stopped (07/31/19 1756)   PRN Meds:.acetaminophen, bisacodyl, menthol-cetylpyridinium **OR** phenol, morphine injection, ondansetron **OR** ondansetron (ZOFRAN) IV, oxyCODONE, senna-docusate, sodium phosphate, zolpidem   I have personally reviewed the following labs and images: CBC: Recent Labs  Lab 07/28/19 0430 07/29/19 0348 07/30/19 0428 07/31/19 0425 07/31/19 0905 08/01/19 0630  WBC 10.8* 11.0* 9.1  --  14.5* 9.4  NEUTROABS  --   --   --   --  11.9*  --   HGB 8.3* 8.5* 8.4* 8.5* 8.6* 8.1*  HCT 24.5* 24.8* 24.5* 26.2* 26.2* 24.6*  MCV 86.6 85.2 86.3  --  88.8 88.2  PLT 168 171 201  --  264 269   BMP &GFR Recent Labs  Lab 07/28/19 0430 07/29/19 0348 07/30/19 0428 07/31/19 0425 08/01/19 0630  NA 132* 131* 132* 132* 135  K 3.5 3.3* 3.7 3.8 3.7  CL 98 97* 99  99 102  CO2 24 25 23 23 24   GLUCOSE 123* 135* 118* 125* 95  BUN 19 17 20  24* 16  CREATININE 0.63 0.60 0.67 0.71 0.72  CALCIUM 8.2* 8.3* 8.2* 8.2* 8.2*  MG  --   --   --  1.9 1.9  PHOS  --   --   --  2.5  --    Estimated Creatinine Clearance: 54.6 mL/min (by C-G formula based on SCr of 0.72 mg/dL). Liver & Pancreas: Recent Labs  Lab 07/30/19 0428 07/31/19 0425  AST 28 24  ALT 28 26  ALKPHOS 51 56  BILITOT 1.2 0.8  PROT 5.3* 5.4*  ALBUMIN 2.8* 2.8*   No results for input(s): LIPASE, AMYLASE in the last 168 hours. No results for input(s): AMMONIA in the last 168 hours. Diabetic: No results for input(s): HGBA1C in the last 72 hours. Recent Labs  Lab 07/31/19 1143 07/31/19 1704 07/31/19 2059 08/01/19 0647 08/01/19 1112  GLUCAP 135* 114* 113* 96 103*   Cardiac Enzymes: No results for input(s): CKTOTAL, CKMB, CKMBINDEX, TROPONINI in the last 168 hours. No results for input(s): PROBNP in the last 8760 hours. Coagulation Profile: No results for input(s): INR,  PROTIME in the last 168 hours. Thyroid Function Tests: Recent Labs    07/30/19 1438  TSH 1.414   Lipid Profile: No results for input(s): CHOL, HDL, LDLCALC, TRIG, CHOLHDL, LDLDIRECT in the last 72 hours. Anemia Panel: Recent Labs    07/30/19 1438  VITAMINB12 2,198*  FOLATE 23.8  FERRITIN 40  TIBC 249*  IRON 22*  RETICCTPCT 5.0*   Urine analysis:    Component Value Date/Time   COLORURINE AMBER (A) 07/31/2019 1501   APPEARANCEUR CLEAR 07/31/2019 1501   APPEARANCEUR Cloudy (A) 09/22/2014 1015   LABSPEC 1.024 07/31/2019 1501   PHURINE 6.0 07/31/2019 1501   GLUCOSEU NEGATIVE 07/31/2019 1501   HGBUR SMALL (A) 07/31/2019 1501   BILIRUBINUR NEGATIVE 07/31/2019 1501   BILIRUBINUR neg 09/06/2018 1110   BILIRUBINUR Negative 09/22/2014 1015   KETONESUR 20 (A) 07/31/2019 1501   PROTEINUR 30 (A) 07/31/2019 1501   UROBILINOGEN 0.2 09/06/2018 1110   UROBILINOGEN 0.2 03/11/2013 2003   NITRITE NEGATIVE 07/31/2019 1501   LEUKOCYTESUR LARGE (A) 07/31/2019 1501   Sepsis Labs: Invalid input(s): PROCALCITONIN, [place]  Microbiology: Recent Results (from the past 240 hour(s))  SARS Coronavirus 2 [facility] order, Performed in [facility] hospital lab) Nasopharyngeal Nasopharyngeal Swab     Status: None   Collection Time: 07/25/19  7:39 AM   Specimen: Nasopharyngeal Swab  Result Value Ref Range Status   SARS Coronavirus 2 NEGATIVE NEGATIVE Final    Comment: (NOTE) If result is NEGATIVE SARS-CoV-2 target nucleic acids are NOT DETECTED. The SARS-CoV-2 RNA is generally detectable in upper and lower  respiratory specimens during the acute phase of infection. The lowest  concentration of SARS-CoV-2 viral copies this assay can detect is 250  copies / mL. A negative result does not preclude SARS-CoV-2 infection  and should not be used as the sole basis for treatment or other  patient management decisions.  A negative result may occur with  improper specimen collection / handling,  submission of specimen other  than nasopharyngeal swab, presence of viral mutation(s) within the  areas targeted by this assay, and inadequate number of viral copies  (<250 copies / mL). A negative result must be combined with clinical  observations, patient history, and epidemiological information. If result is POSITIVE SARS-CoV-2 target nucleic acids are DETECTED. The SARS-CoV-2 RNA  is generally detectable in upper and lower  respiratory specimens dur ing the acute phase of infection.  Positive  results are indicative of active infection with SARS-CoV-2.  Clinical  correlation with patient history and other diagnostic information is  necessary to determine patient infection status.  Positive results do  not rule out bacterial infection or co-infection with other viruses. If result is PRESUMPTIVE POSTIVE SARS-CoV-2 nucleic acids MAY BE PRESENT.   A presumptive positive result was obtained on the submitted specimen  and confirmed on repeat testing.  While 2019 novel coronavirus  (SARS-CoV-2) nucleic acids may be present in the submitted sample  additional confirmatory testing may be necessary for epidemiological  and / or clinical management purposes  to differentiate between  SARS-CoV-2 and other Sarbecovirus currently known to infect humans.  If clinically indicated additional testing with an alternate test  methodology [id number]) is advised. The SARS-CoV-2 RNA is generally  detectable in upper and lower respiratory sp ecimens during the acute  phase of infection. The expected result is Negative. Fact Sheet for Patients:  StrictlyIdeas.no Fact Sheet for Healthcare Providers: BankingDealers.co.za This test is not yet approved or cleared by the [place] FDA and has been authorized for detection and/or diagnosis of SARS-CoV-2 by FDA under an Emergency Use Authorization (EUA).  This EUA will remain in effect (meaning this test can be  used) for the duration of the COVID-19 declaration under Section 564(b)(1) of the Act, 21 U.S.C. section 360bbb-3(b)(1), unless the authorization is terminated or revoked sooner. Performed at [facility], [place] [address], [place], [place] [address]   Surgical PCR screen     Status: None   Collection Time: 07/25/19  5:34 PM   Specimen: Nasal Mucosa; Nasal Swab  Result Value Ref Range Status   MRSA, PCR NEGATIVE NEGATIVE Final   Staphylococcus aureus NEGATIVE NEGATIVE Final    Comment: (NOTE) The Xpert SA Assay (FDA approved for NASAL specimens in patients 48 years of age and older), is one component of a comprehensive surveillance program. It is not intended to diagnose infection nor to guide or monitor treatment. Performed at [facility], [place] [address], [place], [place] [address]   SARS CORONAVIRUS 2 (TAT 6-24 HRS) Nasopharyngeal Nasopharyngeal Swab     Status: None   Collection Time: 07/25/19  6:23 PM   Specimen: Nasopharyngeal Swab  Result Value Ref Range Status   SARS Coronavirus 2 NEGATIVE NEGATIVE Final    Comment: (NOTE) SARS-CoV-2 target nucleic acids are NOT DETECTED. The SARS-CoV-2 RNA is generally detectable in upper and lower respiratory specimens during the acute phase of infection. Negative results do not preclude SARS-CoV-2 infection, do not rule out co-infections with other pathogens, and should not be used as the sole basis for treatment or other patient management decisions. Negative results must be combined with clinical observations, patient history, and epidemiological information. The expected result is Negative. Fact Sheet for Patients: SugarRoll.be Fact Sheet for Healthcare Providers: https://www.woods-mathews.com/ This test is not yet approved or cleared by the [place] FDA and  has been authorized for detection and/or diagnosis of SARS-CoV-2 by FDA under an Emergency Use  Authorization (EUA). This EUA will remain  in effect (meaning this test can be used) for the duration of the COVID-19 declaration under Section 56 4(b)(1) of the Act, 21 U.S.C. section 360bbb-3(b)(1), unless the authorization is terminated or revoked sooner. Performed at [facility], [place] [address], [place], [place] [address]   SARS CORONAVIRUS 2 (TAT  6-24 HRS)     Status: None   Collection Time: 07/31/19  5:24 AM  Result Value Ref Range Status   SARS Coronavirus 2 NEGATIVE NEGATIVE Final    Comment: (NOTE) SARS-CoV-2 target nucleic acids are NOT DETECTED. The SARS-CoV-2 RNA is generally detectable in upper and lower respiratory specimens during the acute phase of infection. Negative results do not preclude SARS-CoV-2 infection, do not rule out co-infections with other pathogens, and should not be used as the sole basis for treatment or other patient management decisions. Negative results must be combined with clinical observations, patient history, and epidemiological information. The expected result is Negative. Fact Sheet for Patients: SugarRoll.be Fact Sheet for Healthcare Providers: https://www.woods-mathews.com/ This test is not yet approved or cleared by the [place] FDA and  has been authorized for detection and/or diagnosis of SARS-CoV-2 by FDA under an Emergency Use Authorization (EUA). This EUA will remain  in effect (meaning this test can be used) for the duration of the COVID-19 declaration under Section 56 4(b)(1) of the Act, 21 U.S.C. section 360bbb-3(b)(1), unless the authorization is terminated or revoked sooner. Performed at [facility], [place] [address] [place], [place] [address]   Culture, Urine     Status: Abnormal   Collection Time: 07/31/19 12:00 PM   Specimen: Urine, Random  Result Value Ref Range Status   Specimen Description URINE, RANDOM  Final   Special Requests   Final    NONE Performed  at [facility], [place] [address], [place], [place] [address]    Culture (A)  Final    80,000 COLONIES/mL MULTIPLE SPECIES PRESENT, SUGGEST RECOLLECTION   Report Status 08/01/2019 FINAL  Final  Culture, blood (routine x 2)     Status: None (Preliminary result)   Collection Time: 07/31/19  4:45 PM   Specimen: BLOOD RIGHT HAND  Result Value Ref Range Status   Specimen Description BLOOD RIGHT HAND  Final   Special Requests   Final    BOTTLES DRAWN AEROBIC ONLY Blood Culture adequate volume   Culture   Final    NO GROWTH < 24 HOURS Performed at [facility], [place] [address], [place], [place] [address]    Report Status PENDING  Incomplete  Culture, blood (routine x 2)     Status: None (Preliminary result)   Collection Time: 07/31/19  4:45 PM   Specimen: BLOOD RIGHT HAND  Result Value Ref Range Status   Specimen Description BLOOD RIGHT HAND  Final   Special Requests   Final    BOTTLES DRAWN AEROBIC ONLY Blood Culture adequate volume   Culture   Final    NO GROWTH < 24 HOURS Performed at [facility], [place] [address], [place], [place] [address]    Report Status PENDING  Incomplete    Radiology Studies: Ct Abdomen Pelvis W Contrast  Result Date: 08/01/2019 CLINICAL DATA:  Abdominal pain, fever EXAM: CT ABDOMEN AND PELVIS WITH CONTRAST TECHNIQUE: Multidetector CT imaging of the abdomen and pelvis was performed using the standard protocol following bolus administration of intravenous contrast. CONTRAST:  160mL OMNIPAQUE IOHEXOL 300 MG/ML  SOLN COMPARISON:  None. FINDINGS: Lower chest: No confluent opacities.  Heart is normal size. Hepatobiliary: Hepatic cysts. No suspicious focal hepatic abnormality or biliary ductal dilatation. Gallbladder unremarkable. Pancreas: No focal abnormality or ductal dilatation. Spleen: No focal abnormality.  Normal size. Adrenals/Urinary Tract: No adrenal abnormality. No focal renal abnormality. No stones or hydronephrosis. Urinary bladder is  unremarkable. Stomach/Bowel:  Stomach, large and small bowel grossly unremarkable. Vascular/Lymphatic: Aortic atherosclerosis. No enlarged abdominal or pelvic lymph nodes. Reproductive: Uterus and adnexa unremarkable.  No mass. Other: No free fluid or free air. Musculoskeletal: No acute bony abnormality. IMPRESSION: No acute findings in the abdomen or pelvis. Aortic atherosclerosis. Hepatic cysts. Electronically Signed   By: Rolm Baptise M.D.   On: 08/01/2019 00:59    [employer] [employer] [facility]  If 7PM-7AM, please contact night-coverage [email or web address] Password [facility] 08/01/2019, 12:47 PM

## 2019-08-01 NOTE — Progress Notes (Signed)
Physical Therapy Treatment Patient Details Name: Andrea [name] MRN: [id number] DOB: 1935-05-18 Today's Date: 08/01/2019    History of Present Illness Andrea [name] is a 84 y.o. female with history of A. fib on Eliquis, remote bilateral TKA, left breast cancer status post lumpectomy and radiation now on chemo, OSA on CPAP, hypothyroidism, overactive bladder, depression/anxiety and DM-2 presenting with mechanical fall at home resulting in distal femur periprosthetic fx, no s/p ORIF, TWB, unlimited ROM knee    PT Comments    Pt performed supine LE exercises and transition from supine>sitting>standing.  Obtained orthostatic vitals and she was unable to maintain standing throughout reading of standing BP due to dizziness.  Pt presents with a blank stare and inability to hold her trunk up.   Pt continues to require SNF.  Charge nurse into assess post session as primary nurse on lunch break.   BPs Supine 155/60 Seated 134/77 Standing 104/60   Follow Up Recommendations  SNF     Equipment Recommendations  (TBD)    Recommendations for Other Services OT consult     Precautions / Restrictions Precautions Precautions: Fall Restrictions Weight Bearing Restrictions: Yes RLE Weight Bearing: Partial weight bearing RLE Partial Weight Bearing Percentage or Pounds: 30% per MD    Mobility  Bed Mobility Overal bed mobility: Needs Assistance Bed Mobility: Supine to Sit     Supine to sit: Mod assist;+2 for physical assistance     General bed mobility comments: Pt required mod +2 for trunk elevation and LE advancement.  Transfers Overall transfer level: Needs assistance Equipment used: Ambulation equipment used(stedy) Transfers: Sit to/from Stand Sit to Stand: Mod assist;+2 physical assistance         General transfer comment: Assistance to boost into standing from elevate surface.  Pt required cues for 30 % weight bearing.  Obtained orthostatic vitals during  session.  Ambulation/Gait Ambulation/Gait assistance: (NT remains unable to tolerate standing.)               Stairs             Wheelchair Mobility    Modified Rankin (Stroke Patients Only)       Balance Overall balance assessment: Needs assistance Sitting-balance support: No upper extremity supported;Feet supported Sitting balance-[name]: Poor       Standing balance-[name]: Poor                              Cognition Arousal/Alertness: Awake/alert Behavior During Therapy: WFL for tasks assessed/performed Overall Cognitive Status: Within Functional Limits for tasks assessed                                 General Comments: Caregiver reports patient is different from baseline.      Exercises General Exercises - Lower Extremity Ankle Circles/Pumps: AROM;Both;10 reps;Supine Quad Sets: AROM;Right;10 reps;Supine Heel Slides: AROM;Right;10 reps;Supine Hip ABduction/ADduction: AROM;Right;10 reps;Supine    General Comments        Pertinent Vitals/Pain Pain Assessment: 0-10 Pain Score: 4  Pain Location: R hip Pain Descriptors / Indicators: Discomfort Pain Intervention(s): Monitored during session;Repositioned    Home Living                      Prior Function            PT Goals (current goals can now be found in the care  plan section) Acute Rehab PT Goals Patient Stated Goal: pt unable to state Potential to Achieve Goals: Fair Progress towards PT goals: Progressing toward goals    Frequency    Min 3X/week      PT Plan Current plan remains appropriate    Co-evaluation              AM-PAC PT "6 Clicks" Mobility   Outcome Measure  Help needed turning from your back to your side while in a flat bed without using bedrails?: A Lot Help needed moving from lying on your back to sitting on the side of a flat bed without using bedrails?: A Lot Help needed moving to and from a bed to a chair  (including a wheelchair)?: A Lot Help needed standing up from a chair using your arms (e.g., wheelchair or bedside chair)?: A Lot Help needed to walk in hospital room?: Total Help needed climbing 3-5 steps with a railing? : Total 6 Click Score: 10    End of Session Equipment Utilized During Treatment: Gait belt Activity Tolerance: Patient limited by fatigue;Patient limited by pain Patient left: in bed;with bed alarm set;with call bell/phone within reach Nurse Communication: Mobility status(informed of orthostatic vitals) PT Visit Diagnosis: Other abnormalities of gait and mobility (R26.89);Pain;Unsteadiness on feet (R26.81) Pain - Right/Left: Right Pain - part of body: Leg     Time: [id number] PT Time Calculation (min) (ACUTE ONLY): 21 min  Charges:  $Therapeutic Activity: 8-22 mins                     Governor Rooks, PTA Acute Rehabilitation Services Pager [phone] Office [phone]     Derrica Sieg Eli Hose 08/01/2019, 3:27 PM

## 2019-08-01 NOTE — Progress Notes (Signed)
Pt refuses CPAP for tonight.   

## 2019-08-02 LAB — BASIC METABOLIC PANEL
Anion gap: 9 (ref 5–15)
BUN: 24 mg/dL — ABNORMAL HIGH (ref 8–23)
CO2: 22 mmol/L (ref 22–32)
Calcium: 8.1 mg/dL — ABNORMAL LOW (ref 8.9–10.3)
Chloride: 103 mmol/L (ref 98–111)
Creatinine, Ser: 0.71 mg/dL (ref 0.44–1.00)
GFR calc Af Amer: >60 mL/min (ref >60)
GFR calc non Af Amer: >60 mL/min (ref >60)
Glucose, Bld: 111 mg/dL — ABNORMAL HIGH (ref 70–99)
Potassium: 3.5 mmol/L (ref 3.5–5.1)
Sodium: 134 mmol/L — ABNORMAL LOW (ref 135–145)

## 2019-08-02 LAB — GLUCOSE, CAPILLARY
Glucose-Capillary: 114 mg/dL — ABNORMAL HIGH (ref 70–99)
Glucose-Capillary: 121 mg/dL — ABNORMAL HIGH (ref 70–99)
Glucose-Capillary: 119 mg/dL — ABNORMAL HIGH (ref 70–99)
Glucose-Capillary: 99 mg/dL (ref 70–99)
Glucose-Capillary: 106 mg/dL — ABNORMAL HIGH (ref 70–99)

## 2019-08-02 LAB — HEMOGLOBIN AND HEMATOCRIT, BLOOD
HCT: 28.9 % — ABNORMAL LOW (ref 36.0–46.0)
Hemoglobin: 9.4 g/dL — ABNORMAL LOW (ref 12.0–15.0)

## 2019-08-02 LAB — PREPARE RBC (CROSSMATCH)

## 2019-08-02 LAB — CBC
HCT: 20.9 % — ABNORMAL LOW (ref 36.0–46.0)
HCT: 21.2 % — ABNORMAL LOW (ref 36.0–46.0)
Hemoglobin: 6.8 g/dL — CL (ref 12.0–15.0)
Hemoglobin: 7.1 g/dL — ABNORMAL LOW (ref 12.0–15.0)
MCH: 29.4 pg (ref 26.0–34.0)
MCH: 30.1 pg (ref 26.0–34.0)
MCHC: 32.5 g/dL (ref 30.0–36.0)
MCHC: 33.5 g/dL (ref 30.0–36.0)
MCV: 90.5 fL (ref 80.0–100.0)
MCV: 89.8 fL (ref 80.0–100.0)
Platelets: 340 10*3/uL (ref 150–400)
Platelets: 317 10*3/uL (ref 150–400)
RBC: 2.31 MIL/uL — ABNORMAL LOW (ref 3.87–5.11)
RBC: 2.36 MIL/uL — ABNORMAL LOW (ref 3.87–5.11)
RDW: 15.1 % (ref 11.5–15.5)
RDW: 15.2 % (ref 11.5–15.5)
WBC: 11.8 10*3/uL — ABNORMAL HIGH (ref 4.0–10.5)
WBC: 11.8 10*3/uL — ABNORMAL HIGH (ref 4.0–10.5)
nRBC: 0.3 % — ABNORMAL HIGH (ref 0.0–0.2)
nRBC: 0.3 % — ABNORMAL HIGH (ref 0.0–0.2)

## 2019-08-02 LAB — OCCULT BLOOD X 1 CARD TO LAB, STOOL: Fecal Occult Bld: POSITIVE — AB

## 2019-08-02 LAB — MAGNESIUM: Magnesium: 1.7 mg/dL (ref 1.7–2.4)

## 2019-08-02 MED ORDER — OXYCODONE HCL 5 MG PO TABS
5.0000 mg | ORAL_TABLET | Freq: Four times a day (QID) | ORAL | Status: DC | PRN
  Administered 2019-08-02: 20:00:00 5 mg via ORAL
  Filled 2019-08-02 (×2): qty 1

## 2019-08-02 MED ORDER — BETHANECHOL CHLORIDE 10 MG PO TABS
10.0000 mg | ORAL_TABLET | Freq: Three times a day (TID) | ORAL | Status: DC
  Administered 2019-08-02 – 2019-08-06 (×12): 10 mg via ORAL
  Filled 2019-08-02 (×14): qty 1

## 2019-08-02 MED ORDER — SODIUM CHLORIDE 0.9% IV SOLUTION
Freq: Once | INTRAVENOUS | Status: AC
  Administered 2019-08-02: 15:00:00 via INTRAVENOUS

## 2019-08-02 MED ORDER — PANTOPRAZOLE SODIUM 40 MG IV SOLR
40.0000 mg | Freq: Two times a day (BID) | INTRAVENOUS | Status: DC
  Administered 2019-08-02 – 2019-08-04 (×5): 40 mg via INTRAVENOUS
  Filled 2019-08-02 (×5): qty 40

## 2019-08-02 MED ORDER — PROPOFOL 1000 MG/100ML IV EMUL
INTRAVENOUS | Status: AC
  Filled 2019-08-02: qty 100

## 2019-08-02 NOTE — Progress Notes (Signed)
Patient was disoriented to place, time, and situation but arouses with stimulation and voice.  Patient pupils non reactive.

## 2019-08-02 NOTE — Progress Notes (Signed)
Towards end of shift pt more alert and seems closer to her baseline. Son was in room with pt for most of evening. Pt is now a&O x2. Handoff given to Paulette Blanch, RN.

## 2019-08-02 NOTE — Progress Notes (Signed)
PROGRESS NOTE  Andrea [name] [id number] DOB: 1935/10/12   PCP: Gayland Curry, DO  Patient is from: home  DOA: 07/25/2019 LOS: 8  Brief Narrative / Interim history: 84 y.o.femalewith history ofA. fib on Eliquis, remote bilateral TKA, left breast cancer status post lumpectomy and radiation now on chemo, OSA on CPAP, hypothyroidism, overactive bladder, depression/anxiety and DM-2 presenting with mechanical fall at home and acute displaced spiral fracture of the distal femoral metadiaphysis.  She underwent open reduction internal fixation of right femoral with application of wound VAC on 07/26/2019 by Dr. Lyla Glassing.  Postop course complicated by acute encephalopathy/drowsiness without focal neuro deficit.  Work-up including CT head and urinalysis were negative.  She has no elevated bicarb to suggest CO2 retention.  She she was on low-dose Remeron but not taking pain medications.   Patient had abdominal pain, fever to 100.3 (geriatric) and leukocytosis to 14.5 with bandemia on 9/10.  She continues to look drowsy and weak.  Had 4 BMs charted over the last 24 hours but has been receiving Colace.  Obtained UA, urine culture, chest x-ray and CT abdomen and pelvis.   Subjective: No major events overnight of this morning.  Hemoglobin dropped to 6.8.  Repeat hemoglobin 7.1.  No obvious bleeding.  However, she is on Eliquis.  Continues to renal status.  She is a sleepy this morning but arises to voice.  No complaints.  She denies chest pain, dyspnea, abdominal pain or UTI symptoms.  However, she is only oriented to self, partial place and family members.  Not sure if she can provide reliable story.  Objective: Vitals:   08/01/19 2046 08/02/19 0419 08/02/19 0826 08/02/19 0836  BP: (!) 147/59 (!) 143/60 (!) 122/49 (!) 121/57  Pulse: 81 79 76   Resp: 17 20    Temp: 98.7 F (37.1 C) 99.5 F (37.5 C) 98.5 F (36.9 C)   TempSrc: Oral Oral Oral   SpO2: 98% 97% 96%   Weight:      Height:         Intake/Output Summary (Last 24 hours) at 08/02/2019 1343 Last data filed at 08/02/2019 1145 Gross per 24 hour  Intake 0 ml  Output 950 ml  Net -950 ml   Filed Weights   07/28/19 0500 07/29/19 0500 07/31/19 0500  Weight: 92.2 kg 84.6 kg 87.8 kg    Examination:  GENERAL: No acute distress.  Sleepy but rises to voice.  Follows some commands. HEENT: MMM.  Vision and hearing grossly intact.  NECK: Supple.  No apparent JVD.  RESP:  No IWOB. Good air movement bilaterally. CVS:  RRR. Heart sounds normal.  ABD/GI/GU: Bowel sounds present. Soft. Non tender.  MSK/EXT:  Moves extremities. No apparent deformity or edema.  SKIN: Surgical wound dressing/VAC over RLE clean, dry and intact. NEURO: Sleepy but rises to voice easily.  Cranial nerves intact.  Moves all extremities symmetrically but weak all over. PSYCH: Calm but sleepy.  Assessment & Plan: Periprosthetic displaced right distal femoral fracture -ORIF right femur, wound VAC in place, touchdown weightbearing on the right lower extremity.  -Hold Eliquis.  Hgb down to 6.8.  Resume at 2.5 mg twice daily if H&H stable after transfusion. -SCD for VT prophylaxis. -Praveena wound VAC on discharge -PT recommending SNF for ongoing PT/OT.  Acute metabolic encephalopathy/drowsiness: Likely delirium.  She has waxing and waning mental status. No history of dementia.  Extensive work-up for encephalopathy with unremarkable CT head, CXR, CT A/P, TSH and vitamin B12.  However, had  mild fever, leukocytosis and LABP.  Urinalysis concerning for UTI and dehydration with ketonuria. -IV ceftriaxone on 9/10>>. -Urine culture with 80,000 colonies of multiple species -Follow repeat urine culture-could be falsely negative given antibiotics. -Delirium precautions.  Dehydration: Likely due to poor p.o. intake -Requires intermittent IV fluid.  Orthostatic hypotension: Not on BP meds. -PT/OT and TED hose -If no improvement, will consider midodrine   Paroxysmal A. fib, currently in sinus rhythm -Eliquis as above -NSR without medications.  Acute blood loss anemia: Anemia panel consistent with iron deficiency. -Hgb 12 (admit) >> 6.8 (postop) > 1u> 8.0 >> 6.8>1u>  -Asked RN to secure 2 good IV lines -Received IV Feraheme 9/10 denies no mechanical noise -Hemoccult   Hypothyroidism: TSH within normal. -Continue Synthroid  OSA on CPAP -Encourage nightly CPAP-reportedly has been refusing.  Left breast cancer status post lumpectomy and radiation, now on chemotherapy -Followed by Dr. Lindi Adie as an outpatient  Tobacco use -Nicotine patch  Anxiety/depression -Reduced home Cymbalta to 60 mg although this is activating.  -Hold home Remeron and trazodone.  Nutrition: Reportedly poor p.o. intake per RN. -Appreciate dietitian input -Check prealbumin level.  DVT prophylaxis: On Eliquis Code Status: DNR/DNI Family Communication: Updated patient's daughter over the phone.  She is in agreement with blood transfusion and care as above. Disposition Plan: Remains inpatient to ensure stable Consultants: Orthopedic surgery  Procedures:  9/5-ORIF by Dr. Lyla Glassing. MRSA PCR negative.  Microbiology summarized: [id number] negative.  Antimicrobials: Anti-infectives (From admission, onward)   Start     Dose/Rate Route Frequency Ordered Stop   07/31/19 1615  cefTRIAXone (ROCEPHIN) 1 g in sodium chloride 0.9 % 100 mL IVPB     1 g 200 mL/hr over 30 Minutes Intravenous Every 24 hours 07/31/19 1600     07/26/19 1430  ceFAZolin (ANCEF) IVPB 2g/100 mL premix     2 g 200 mL/hr over 30 Minutes Intravenous Every 6 hours 07/26/19 1353 07/26/19 2335   07/26/19 0645  ceFAZolin (ANCEF) IVPB 2g/100 mL premix     2 g 200 mL/hr over 30 Minutes Intravenous On call to O.R. 07/26/19 [id number] 07/26/19 0903   07/26/19 0641  ceFAZolin (ANCEF) 2-4 GM/100ML-% IVPB    Note to Pharmacy: Henrine Screws   : cabinet override      07/26/19 0641 07/26/19 0833      Sch  Meds:  Scheduled Meds: . sodium chloride   Intravenous Once  . DULoxetine  60 mg Oral Daily  . ferrous sulfate  325 mg Oral BID WC  . insulin aspart  0-5 Units Subcutaneous QHS  . insulin aspart  0-9 Units Subcutaneous TID WC  . levothyroxine  50 mcg Oral Q0600  . nicotine  7 mg Transdermal Daily  . oxybutynin  10 mg Oral Daily  . pantoprazole (PROTONIX) IV  40 mg Intravenous Q12H  . vitamin B-12  500 mcg Oral Daily   Continuous Infusions: . cefTRIAXone (ROCEPHIN)  IV Stopped (08/01/19 1744)   PRN Meds:.acetaminophen, bisacodyl, menthol-cetylpyridinium **OR** phenol, ondansetron **OR** ondansetron (ZOFRAN) IV, oxyCODONE, senna-docusate, sodium phosphate, zolpidem   I have personally reviewed the following labs and images: CBC: Recent Labs  Lab 07/30/19 0428 07/31/19 0425 07/31/19 0905 08/01/19 0630 08/02/19 0712 08/02/19 0941  WBC 9.1  --  14.5* 9.4 11.8* 11.8*  NEUTROABS  --   --  11.9*  --   --   --   HGB 8.4* 8.5* 8.6* 8.1* 6.8* 7.1*  HCT 24.5* 26.2* 26.2* 24.6* 20.9* 21.2*  MCV 86.3  --  88.8 88.2 90.5 89.8  PLT 201  --  264 269 340 317   BMP &GFR Recent Labs  Lab 07/29/19 0348 07/30/19 0428 07/31/19 0425 08/01/19 0630 08/02/19 0537  NA 131* 132* 132* 135 134*  K 3.3* 3.7 3.8 3.7 3.5  CL 97* 99 99 102 103  CO2 25 23 23 24 22   GLUCOSE 135* 118* 125* 95 111*  BUN 17 20 24* 16 24*  CREATININE 0.60 0.67 0.71 0.72 0.71  CALCIUM 8.3* 8.2* 8.2* 8.2* 8.1*  MG  --   --  1.9 1.9 1.7  PHOS  --   --  2.5  --   --    Estimated Creatinine Clearance: 54.6 mL/min (by C-G formula based on SCr of 0.71 mg/dL). Liver & Pancreas: Recent Labs  Lab 07/30/19 0428 07/31/19 0425  AST 28 24  ALT 28 26  ALKPHOS 51 56  BILITOT 1.2 0.8  PROT 5.3* 5.4*  ALBUMIN 2.8* 2.8*   No results for input(s): LIPASE, AMYLASE in the last 168 hours. No results for input(s): AMMONIA in the last 168 hours. Diabetic: No results for input(s): HGBA1C in the last 72 hours. Recent Labs  Lab  08/01/19 1559 08/01/19 2149 08/02/19 0627 08/02/19 0831 08/02/19 1135  GLUCAP 124* 106* 114* 121* 119*   Cardiac Enzymes: No results for input(s): CKTOTAL, CKMB, CKMBINDEX, TROPONINI in the last 168 hours. No results for input(s): PROBNP in the last 8760 hours. Coagulation Profile: No results for input(s): INR, PROTIME in the last 168 hours. Thyroid Function Tests: Recent Labs    07/30/19 1438  TSH 1.414   Lipid Profile: No results for input(s): CHOL, HDL, LDLCALC, TRIG, CHOLHDL, LDLDIRECT in the last 72 hours. Anemia Panel: Recent Labs    07/30/19 1438  VITAMINB12 2,198*  FOLATE 23.8  FERRITIN 40  TIBC 249*  IRON 22*  RETICCTPCT 5.0*   Urine analysis:    Component Value Date/Time   COLORURINE AMBER (A) 07/31/2019 1501   APPEARANCEUR CLEAR 07/31/2019 1501   APPEARANCEUR Cloudy (A) 09/22/2014 1015   LABSPEC 1.024 07/31/2019 1501   PHURINE 6.0 07/31/2019 1501   GLUCOSEU NEGATIVE 07/31/2019 1501   HGBUR SMALL (A) 07/31/2019 1501   BILIRUBINUR NEGATIVE 07/31/2019 1501   BILIRUBINUR neg 09/06/2018 1110   BILIRUBINUR Negative 09/22/2014 1015   KETONESUR 20 (A) 07/31/2019 1501   PROTEINUR 30 (A) 07/31/2019 1501   UROBILINOGEN 0.2 09/06/2018 1110   UROBILINOGEN 0.2 03/11/2013 2003   NITRITE NEGATIVE 07/31/2019 1501   LEUKOCYTESUR LARGE (A) 07/31/2019 1501   Sepsis Labs: Invalid input(s): PROCALCITONIN, [place]  Microbiology: Recent Results (from the past 240 hour(s))  SARS Coronavirus 2 [facility] order, Performed in [facility] hospital lab) Nasopharyngeal Nasopharyngeal Swab     Status: None   Collection Time: 07/25/19  7:39 AM   Specimen: Nasopharyngeal Swab  Result Value Ref Range Status   SARS Coronavirus 2 NEGATIVE NEGATIVE Final    Comment: (NOTE) If result is NEGATIVE SARS-CoV-2 target nucleic acids are NOT DETECTED. The SARS-CoV-2 RNA is generally detectable in upper and lower  respiratory specimens during the acute phase of infection. The lowest   concentration of SARS-CoV-2 viral copies this assay can detect is 250  copies / mL. A negative result does not preclude SARS-CoV-2 infection  and should not be used as the sole basis for treatment or other  patient management decisions.  A negative result may occur with  improper specimen collection / handling, submission of specimen other  than nasopharyngeal swab, presence of  viral mutation(s) within the  areas targeted by this assay, and inadequate number of viral copies  (<250 copies / mL). A negative result must be combined with clinical  observations, patient history, and epidemiological information. If result is POSITIVE SARS-CoV-2 target nucleic acids are DETECTED. The SARS-CoV-2 RNA is generally detectable in upper and lower  respiratory specimens dur ing the acute phase of infection.  Positive  results are indicative of active infection with SARS-CoV-2.  Clinical  correlation with patient history and other diagnostic information is  necessary to determine patient infection status.  Positive results do  not rule out bacterial infection or co-infection with other viruses. If result is PRESUMPTIVE POSTIVE SARS-CoV-2 nucleic acids MAY BE PRESENT.   A presumptive positive result was obtained on the submitted specimen  and confirmed on repeat testing.  While 2019 novel coronavirus  (SARS-CoV-2) nucleic acids may be present in the submitted sample  additional confirmatory testing may be necessary for epidemiological  and / or clinical management purposes  to differentiate between  SARS-CoV-2 and other Sarbecovirus currently known to infect humans.  If clinically indicated additional testing with an alternate test  methodology [id number]) is advised. The SARS-CoV-2 RNA is generally  detectable in upper and lower respiratory sp ecimens during the acute  phase of infection. The expected result is Negative. Fact Sheet for Patients:  StrictlyIdeas.no Fact Sheet  for Healthcare Providers: BankingDealers.co.za This test is not yet approved or cleared by the [place] FDA and has been authorized for detection and/or diagnosis of SARS-CoV-2 by FDA under an Emergency Use Authorization (EUA).  This EUA will remain in effect (meaning this test can be used) for the duration of the COVID-19 declaration under Section 564(b)(1) of the Act, 21 U.S.C. section 360bbb-3(b)(1), unless the authorization is terminated or revoked sooner. Performed at [facility], [place] [address] [place], [place] [address]   Surgical PCR screen     Status: None   Collection Time: 07/25/19  5:34 PM   Specimen: Nasal Mucosa; Nasal Swab  Result Value Ref Range Status   MRSA, PCR NEGATIVE NEGATIVE Final   Staphylococcus aureus NEGATIVE NEGATIVE Final    Comment: (NOTE) The Xpert SA Assay (FDA approved for NASAL specimens in patients 82 years of age and older), is one component of a comprehensive surveillance program. It is not intended to diagnose infection nor to guide or monitor treatment. Performed at [facility], [place] [address], [place], [place] [address]   SARS CORONAVIRUS 2 (TAT 6-24 HRS) Nasopharyngeal Nasopharyngeal Swab     Status: None   Collection Time: 07/25/19  6:23 PM   Specimen: Nasopharyngeal Swab  Result Value Ref Range Status   SARS Coronavirus 2 NEGATIVE NEGATIVE Final    Comment: (NOTE) SARS-CoV-2 target nucleic acids are NOT DETECTED. The SARS-CoV-2 RNA is generally detectable in upper and lower respiratory specimens during the acute phase of infection. Negative results do not preclude SARS-CoV-2 infection, do not rule out co-infections with other pathogens, and should not be used as the sole basis for treatment or other patient management decisions. Negative results must be combined with clinical observations, patient history, and epidemiological information. The expected result is Negative. Fact  Sheet for Patients: SugarRoll.be Fact Sheet for Healthcare Providers: https://www.woods-mathews.com/ This test is not yet approved or cleared by the [place] FDA and  has been authorized for detection and/or diagnosis of SARS-CoV-2 by FDA under an Emergency Use Authorization (EUA). This EUA will remain  in effect (meaning this  test can be used) for the duration of the COVID-19 declaration under Section 56 4(b)(1) of the Act, 21 U.S.C. section 360bbb-3(b)(1), unless the authorization is terminated or revoked sooner. Performed at [facility], [place] [address], [place], [place] [address]   SARS CORONAVIRUS 2 (TAT 6-24 HRS)     Status: None   Collection Time: 07/31/19  5:24 AM  Result Value Ref Range Status   SARS Coronavirus 2 NEGATIVE NEGATIVE Final    Comment: (NOTE) SARS-CoV-2 target nucleic acids are NOT DETECTED. The SARS-CoV-2 RNA is generally detectable in upper and lower respiratory specimens during the acute phase of infection. Negative results do not preclude SARS-CoV-2 infection, do not rule out co-infections with other pathogens, and should not be used as the sole basis for treatment or other patient management decisions. Negative results must be combined with clinical observations, patient history, and epidemiological information. The expected result is Negative. Fact Sheet for Patients: SugarRoll.be Fact Sheet for Healthcare Providers: https://www.woods-mathews.com/ This test is not yet approved or cleared by the [place] FDA and  has been authorized for detection and/or diagnosis of SARS-CoV-2 by FDA under an Emergency Use Authorization (EUA). This EUA will remain  in effect (meaning this test can be used) for the duration of the COVID-19 declaration under Section 56 4(b)(1) of the Act, 21 U.S.C. section 360bbb-3(b)(1), unless the authorization is terminated or revoked  sooner. Performed at [facility], [place] [address] [place], [place] [address]   Culture, Urine     Status: Abnormal   Collection Time: 07/31/19 12:00 PM   Specimen: Urine, Random  Result Value Ref Range Status   Specimen Description URINE, RANDOM  Final   Special Requests   Final    NONE Performed at [facility], [place] [address], [place], [place] [address]    Culture (A)  Final    80,000 COLONIES/mL MULTIPLE SPECIES PRESENT, SUGGEST RECOLLECTION   Report Status 08/01/2019 FINAL  Final  Culture, blood (routine x 2)     Status: None (Preliminary result)   Collection Time: 07/31/19  4:45 PM   Specimen: BLOOD RIGHT HAND  Result Value Ref Range Status   Specimen Description BLOOD RIGHT HAND  Final   Special Requests   Final    BOTTLES DRAWN AEROBIC ONLY Blood Culture adequate volume   Culture   Final    NO GROWTH 2 DAYS Performed at [facility], [place] [address], [place], [place] [address]    Report Status PENDING  Incomplete  Culture, blood (routine x 2)     Status: None (Preliminary result)   Collection Time: 07/31/19  4:45 PM   Specimen: BLOOD RIGHT HAND  Result Value Ref Range Status   Specimen Description BLOOD RIGHT HAND  Final   Special Requests   Final    BOTTLES DRAWN AEROBIC ONLY Blood Culture adequate volume   Culture   Final    NO GROWTH 2 DAYS Performed at [facility], [place] [address], [place],  [address]    Report Status PENDING  Incomplete    Radiology Studies: No results found.   T. Fulton  If 7PM-7AM, please contact night-coverage [email or web address] Password [facility] 08/02/2019, 1:43 PM

## 2019-08-02 NOTE — Progress Notes (Signed)
CRITICAL VALUE STICKER  CRITICAL VALUE: hgb 6.8  DATE & TIME NOTIFIED: 09/12 [id number]  MD NOTIFIED: yes text paged on call hospitalist awaiting orders. Will continue to monitor pt.

## 2019-08-02 NOTE — Progress Notes (Signed)
Went to pt's room for assessment and med pass. Pt difficult to arouse from sleep. Pt oriented x1 to self disoriented to place, time, and situation. Pt is very lethargic. Pt could not follow pen light and was staring. Pupils were reactive to light. See detailed assessment in epic. Pt could follow simple commands. VS obtained and Rapid response nurse called orders to check CBG also. VS stable. CBG 121.  0815 Rapid response nurse came to room to see pt and assessed pt. Pt responding more to commands. MD called and made aware of change in pt status. MD gave orders and stated to continue to monitor pt. Will continue to monitor pt.

## 2019-08-02 NOTE — Significant Event (Addendum)
Rapid Response Event Note  Overview: Neurologic - AMS  Initial Focused Assessment: Called by nurse with concerns of patient not responding and being as alert as she was much before. Nurse was obtaining VS/BS, I asked the nurse reach out the MD again and administer Narcan 0.4 mg IV (post-op hip surgery, intermittently received narcotics/opoids) and that I would come as soon as I could (with another patient in an emergency).   When I arrived, patient was awake, eyes open, seems to look out, but could track my finger, EOM were intact, able to follow commands, MOE x 4 - generalized weakness but equally as weak, no sensory loss and no facial asymmetry, + CR, pupils 38mm brisk/round/reactive/equal. VS and BS were normal. Nurse had already spoken to the MD, per MD will hold off on Narcan, repeat CBC (H/H 6.8/20.9 - down from 8.1/24.6). MD to come see patient. Patient was requesting to get out of bed and sit in the chair. Not in acute distress. Skin warm to touch, no fever per nurse.   Interventions: -- PIV x 2 started (18G LFA) and (20G RFA).   Plan of Care: -- Repeat CBC  -- OOB to chair -- Delirium Precautions.   1345 -  Called for an update, spoke with the nurse, patient remains the same, MD did come see her after I did. PRBCs ready for pick up, nurse to transfuse.  [place] for an update, per nurse, patient is more awake now and has improved. Of note + FOBT, MD aware.  Event Summary:  Call Time 0822 Arrival Time 0832 End Time 0900   Kimberlin Scheel R

## 2019-08-02 NOTE — Progress Notes (Signed)
Spoke with MD regarding pt retaining urine, bladder scan showed > 415 ml and he stated to monitor pt to see if she will void on her own in 1 hr. If pt does not void in 1 hr then bladder scan again and if pt is retaining 600 or more to insert foley catheter. Will continue to monitor pt.

## 2019-08-02 NOTE — Progress Notes (Signed)
Md aware of positive result on fecal occult. Will continue to monitor pt.

## 2019-08-02 NOTE — Progress Notes (Signed)
Occupational Therapy Treatment Patient Details Name: Andrea [name] MRN: [id number] DOB: [birth year] Today's Date: 08/02/2019    History of present illness Andrea [name] is a 84 y.o. female with history of A. fib on Eliquis, remote bilateral TKA, left breast cancer status post lumpectomy and radiation now on chemo, OSA on CPAP, hypothyroidism, overactive bladder, depression/anxiety and DM-2 presenting with mechanical fall at home resulting in distal femur periprosthetic fx, no s/p ORIF, TWB, unlimited ROM knee   OT comments  Pt. Was very lethargic when OT entered room. Pt. Nurse informed therapist that she was very lethargic and rapid response was called earlier secondary to pt. Not responding to stimuli. The rapid response tear found patient alert and not in distress. Pt. Hemoglobin low per nursing and MD had not decided if he was going to transfuse. Nursing ok therapy working with patient. Pt. Was Mod A with supine to sit and sit to supine. Pt. Was requiring assist to maintain balance. Pt. Stated while sitting EOB to not let her fall pnt was keeping her eyes closed during session and required prompting to respond to therapist. Pt. Was able to perform grooming tasks at [facility] A level.   Follow Up Recommendations       Equipment Recommendations       Recommendations for Other Services      Precautions / Restrictions Precautions Precautions: Fall Restrictions RLE Weight Bearing: Partial weight bearing RLE Partial Weight Bearing Percentage or Pounds: 30       Mobility Bed Mobility         Supine to sit: Mod assist Sit to supine: Mod assist   General bed mobility comments: Pt. required cues for technique  Transfers                      Balance     Sitting balance-[name]: Poor Sitting balance - Comments: Pt. kept eyes closed unless cued to open eyes                                   ADL either performed or assessed with clinical judgement    ADL                                         General ADL Comments: Pt. was  Max a  to wash    hands.     Vision       Perception     Praxis      Cognition Arousal/Alertness: Lethargic                                              Exercises     Shoulder Instructions       General Comments      Pertinent Vitals/ Pain       Pain Assessment: No/denies pain  Home Living                                          Prior Functioning/Environment              Frequency  Progress Toward Goals  OT Goals(current goals can now be found in the care plan section)  Progress towards OT goals: Progressing toward goals  Acute Rehab OT Goals Patient Stated Goal: none stated  Plan Discharge plan remains appropriate    Co-evaluation                 AM-PAC OT "6 Clicks" Daily Activity     Outcome Measure   Help from another person eating meals?: Total Help from another person taking care of personal grooming?: Total Help from another person toileting, which includes using toliet, bedpan, or urinal?: Total Help from another person bathing (including washing, rinsing, drying)?: Total Help from another person to put on and taking off regular upper body clothing?: Total Help from another person to put on and taking off regular lower body clothing?: Total 6 Click Score: 6    End of Session        Activity Tolerance Patient limited by lethargy   Patient Left in bed;with call bell/phone within reach;with bed alarm set   Nurse Communication (ok therapy)        Time: 0940-1010 OT Time Calculation (min): 30 min  Charges: OT General Charges $OT Visit: 1 Visit OT Treatments $Therapeutic Activity: [id number] mins  6 clicks  Zekiel Torian 08/02/2019, 1:24 PM

## 2019-08-03 DIAGNOSIS — D62 Acute posthemorrhagic anemia: Secondary | ICD-10-CM

## 2019-08-03 LAB — URINALYSIS, ROUTINE W REFLEX MICROSCOPIC
Bacteria, UA: NONE SEEN
Bilirubin Urine: NEGATIVE
Glucose, UA: NEGATIVE mg/dL
Ketones, ur: 5 mg/dL — AB
Leukocytes,Ua: NEGATIVE
Nitrite: NEGATIVE
Protein, ur: NEGATIVE mg/dL
Specific Gravity, Urine: 1.011 (ref 1.005–1.030)
pH: 6 (ref 5.0–8.0)

## 2019-08-03 LAB — BPAM RBC
Blood Product Expiration Date: 202010052359
ISSUE DATE / TIME: 202009121425
Unit Type and Rh: 6200

## 2019-08-03 LAB — PHOSPHORUS: Phosphorus: 2.7 mg/dL (ref 2.5–4.6)

## 2019-08-03 LAB — TYPE AND SCREEN
ABO/RH(D): A POS
Antibody Screen: NEGATIVE
Unit division: 0

## 2019-08-03 LAB — BASIC METABOLIC PANEL
Anion gap: 10 (ref 5–15)
BUN: 21 mg/dL (ref 8–23)
CO2: 21 mmol/L — ABNORMAL LOW (ref 22–32)
Calcium: 8.3 mg/dL — ABNORMAL LOW (ref 8.9–10.3)
Chloride: 102 mmol/L (ref 98–111)
Creatinine, Ser: 0.64 mg/dL (ref 0.44–1.00)
GFR calc Af Amer: >60 mL/min (ref >60)
GFR calc non Af Amer: >60 mL/min (ref >60)
Glucose, Bld: 114 mg/dL — ABNORMAL HIGH (ref 70–99)
Potassium: 4.1 mmol/L (ref 3.5–5.1)
Sodium: 133 mmol/L — ABNORMAL LOW (ref 135–145)

## 2019-08-03 LAB — MAGNESIUM: Magnesium: 1.8 mg/dL (ref 1.7–2.4)

## 2019-08-03 LAB — CBC
HCT: 27.3 % — ABNORMAL LOW (ref 36.0–46.0)
Hemoglobin: 9.1 g/dL — ABNORMAL LOW (ref 12.0–15.0)
MCH: 29.7 pg (ref 26.0–34.0)
MCHC: 33.3 g/dL (ref 30.0–36.0)
MCV: 89.2 fL (ref 80.0–100.0)
Platelets: 316 10*3/uL (ref 150–400)
RBC: 3.06 MIL/uL — ABNORMAL LOW (ref 3.87–5.11)
RDW: 15.5 % (ref 11.5–15.5)
WBC: 12.9 10*3/uL — ABNORMAL HIGH (ref 4.0–10.5)
nRBC: 0.4 % — ABNORMAL HIGH (ref 0.0–0.2)

## 2019-08-03 LAB — GLUCOSE, CAPILLARY
Glucose-Capillary: 110 mg/dL — ABNORMAL HIGH (ref 70–99)
Glucose-Capillary: 105 mg/dL — ABNORMAL HIGH (ref 70–99)
Glucose-Capillary: 120 mg/dL — ABNORMAL HIGH (ref 70–99)
Glucose-Capillary: 105 mg/dL — ABNORMAL HIGH (ref 70–99)

## 2019-08-03 MED ORDER — APIXABAN 5 MG PO TABS
5.0000 mg | ORAL_TABLET | Freq: Two times a day (BID) | ORAL | Status: DC
  Administered 2019-08-03 – 2019-08-06 (×6): 5 mg via ORAL
  Filled 2019-08-03 (×6): qty 1

## 2019-08-03 NOTE — Progress Notes (Signed)
Pt was reassessed, extraocular movements now WDL. Will continue to monitor.  Adams, RN 08/03/2019 4:32 PM

## 2019-08-03 NOTE — Progress Notes (Signed)
PROGRESS NOTE  Andrea [name] [id number] DOB: [birth year]   PCP: Gayland Curry, DO  Patient is from: home  DOA: 07/25/2019 LOS: 9  Brief Narrative / Interim history: 84 y.o.femalewith history ofA. fib on Eliquis, remote bilateral TKA, left breast cancer status post lumpectomy and radiation now on chemo, OSA on CPAP, hypothyroidism, overactive bladder, depression/anxiety and DM-2 presenting with mechanical fall at home and acute displaced spiral fracture of the distal femoral metadiaphysis.  She underwent open reduction internal fixation of right femoral with application of wound VAC on 07/26/2019 by Dr. Lyla Glassing.  Postop course complicated by acute encephalopathy/drowsiness without focal neuro deficit.  Work-up including CT head and urinalysis were negative.  She has no elevated bicarb to suggest CO2 retention.  She she was on low-dose Remeron but not taking pain medications.   Patient had abdominal pain, fever to 100.3 (geriatric) and leukocytosis to 14.5 with bandemia on 9/10.  She continues to look drowsy and weak.  Had 4 BMs charted over the last 24 hours but has been receiving Colace.  Obtained UA, urine culture, chest x-ray and CT abdomen and pelvis.  08/03/2019: Patient seen.  No new complaints.  Patient reports feeling better today.  Patient seems more coherent today.  H&H is stable.  Will repeat urinalysis and urine culture.  Will restart patient's Eliquis, and will monitor H&H closely.  Subjective: No fever or chills No chest pain No nausea vomiting No shortness of breath  Objective: Vitals:   08/02/19 1947 08/03/19 0412 08/03/19 0825 08/03/19 1542  BP: (!) 149/59 (!) 156/65 (!) 157/66 (!) 134/55  Pulse: 72 80 79 92  Resp: 17 17 17 15   Temp: 98.7 F (37.1 C) 98.5 F (36.9 C) 98.5 F (36.9 C) 99.4 F (37.4 C)  TempSrc: Oral Oral Oral Oral  SpO2: 100% 99% 99% 96%  Weight:      Height:        Intake/Output Summary (Last 24 hours) at 08/03/2019 1557 Last data  filed at 08/03/2019 1548 Gross per 24 hour  Intake 1134 ml  Output 1850 ml  Net -716 ml   Filed Weights   07/28/19 0500 07/29/19 0500 07/31/19 0500  Weight: 92.2 kg 84.6 kg 87.8 kg    Examination:  GENERAL: No acute distress.  Awake and alert.  Patient is communicative. HEENT: MILD PALLOR.  NO JAUNDICE.    NECK: Supple.  No apparent JVD.  RESP: Clear to auscultation. CVS: S1-S2. ABD: Obese, soft and nontender.  Organs are difficult to assess.   MSK/EXT: Right lower extremity is swollen from recent surgery referral distal right femoral fracture.   NEURO: Awake, alert and coherent.  Patient moves all extremities.  Assessment & Plan: Periprosthetic displaced right distal femoral fracture -ORIF right femur, wound VAC in place, touchdown weightbearing on the right lower extremity.  -Restart Eliquis.  Monitor H/H closely.  Hemoglobin today is 9.1 g/dL. -Praveena wound VAC on discharge -PT recommending SNF for ongoing PT/OT.  Acute metabolic encephalopathy/drowsiness: Likely delirium.  She has waxing and waning mental status. No history of dementia.  Extensive work-up for encephalopathy with unremarkable CT head, CXR, CT A/P, TSH and vitamin B12.  However, had mild fever, leukocytosis and LABP.  Urinalysis concerning for UTI and dehydration with ketonuria. -IV ceftriaxone on 9/10>>. -Urine culture with 80,000 colonies of multiple species -Follow repeat urine culture-could be falsely negative given antibiotics. -Delirium precautions. 08/03/2019: Encephalopathy seems to be resolving.  Leukocytosis persists.  Will repeat UA and urine culture.  Dehydration: Likely  due to poor p.o. intake -Requires intermittent IV fluid. 08/03/2019: Continue to monitor closely.  Orthostatic hypotension: Not on BP meds. -PT/OT and TED hose -If no improvement, will consider midodrine  Paroxysmal A. fib, currently in sinus rhythm -Eliquis as above -NSR without medications.  Acute blood loss anemia:  Anemia panel consistent with iron deficiency. -Hgb 12 (admit) >> 6.8 (postop) > 1u> 8.0 >> 6.8>1u>  -Asked RN to secure 2 good IV lines -Received IV Feraheme 9/10 denies no mechanical noise -Hemoccult 08/03/2019: Hemoglobin is stable.  Continue to monitor closely.  Hypothyroidism: TSH within normal. -Continue Synthroid  OSA on CPAP -Encourage nightly CPAP-reportedly has been refusing.  Left breast cancer status post lumpectomy and radiation, now on chemotherapy -Followed by Dr. Lindi Adie as an outpatient  Tobacco use -Nicotine patch  Anxiety/depression -Reduced home Cymbalta to 60 mg although this is activating.  -Hold home Remeron and trazodone.  Nutrition: Reportedly poor p.o. intake per RN. -Appreciate dietitian input -Check prealbumin level.  DVT prophylaxis: On Eliquis Code Status: DNR/DNI Family Communication:  Disposition Plan: Remains inpatient to ensure stable Consultants: Orthopedic surgery  Procedures:  9/5-ORIF by Dr. Lyla Glassing. MRSA PCR negative.  Microbiology summarized: [id number] negative.  Antimicrobials: Anti-infectives (From admission, onward)   Start     Dose/Rate Route Frequency Ordered Stop   07/31/19 1615  cefTRIAXone (ROCEPHIN) 1 g in sodium chloride 0.9 % 100 mL IVPB     1 g 200 mL/hr over 30 Minutes Intravenous Every 24 hours 07/31/19 1600     07/26/19 1430  ceFAZolin (ANCEF) IVPB 2g/100 mL premix     2 g 200 mL/hr over 30 Minutes Intravenous Every 6 hours 07/26/19 1353 07/26/19 2335   07/26/19 0645  ceFAZolin (ANCEF) IVPB 2g/100 mL premix     2 g 200 mL/hr over 30 Minutes Intravenous On call to O.R. 07/26/19 [id number] 07/26/19 0903   07/26/19 0641  ceFAZolin (ANCEF) 2-4 GM/100ML-% IVPB    Note to Pharmacy: Henrine Screws   : cabinet override      07/26/19 0641 07/26/19 0833      Sch Meds:  Scheduled Meds: . bethanechol  10 mg Oral TID  . DULoxetine  60 mg Oral Daily  . ferrous sulfate  325 mg Oral BID WC  . insulin aspart  0-5 Units  Subcutaneous QHS  . insulin aspart  0-9 Units Subcutaneous TID WC  . levothyroxine  50 mcg Oral Q0600  . nicotine  7 mg Transdermal Daily  . pantoprazole (PROTONIX) IV  40 mg Intravenous Q12H  . vitamin B-12  500 mcg Oral Daily   Continuous Infusions: . cefTRIAXone (ROCEPHIN)  IV 1 g (08/03/19 1521)   PRN Meds:.acetaminophen, bisacodyl, menthol-cetylpyridinium **OR** phenol, ondansetron **OR** ondansetron (ZOFRAN) IV, oxyCODONE, senna-docusate, sodium phosphate, zolpidem   I have personally reviewed the following labs and images: CBC: Recent Labs  Lab 07/31/19 0905 08/01/19 0630 08/02/19 0712 08/02/19 0941 08/02/19 2134 08/03/19 0441  WBC 14.5* 9.4 11.8* 11.8*  --  12.9*  NEUTROABS 11.9*  --   --   --   --   --   HGB 8.6* 8.1* 6.8* 7.1* 9.4* 9.1*  HCT 26.2* 24.6* 20.9* 21.2* 28.9* 27.3*  MCV 88.8 88.2 90.5 89.8  --  89.2  PLT 264 269 340 317  --  316   BMP &GFR Recent Labs  Lab 07/30/19 0428 07/31/19 0425 08/01/19 0630 08/02/19 0537 08/03/19 0441  NA 132* 132* 135 134* 133*  K 3.7 3.8 3.7 3.5 4.1  CL 99 99  102 103 102  CO2 23 23 24 22  21*  GLUCOSE 118* 125* 95 111* 114*  BUN 20 24* 16 24* 21  CREATININE 0.67 0.71 0.72 0.71 0.64  CALCIUM 8.2* 8.2* 8.2* 8.1* 8.3*  MG  --  1.9 1.9 1.7 1.8  PHOS  --  2.5  --   --  2.7   Estimated Creatinine Clearance: 54.6 mL/min (by C-G formula based on SCr of 0.64 mg/dL). Liver & Pancreas: Recent Labs  Lab 07/30/19 0428 07/31/19 0425  AST 28 24  ALT 28 26  ALKPHOS 51 56  BILITOT 1.2 0.8  PROT 5.3* 5.4*  ALBUMIN 2.8* 2.8*   No results for input(s): LIPASE, AMYLASE in the last 168 hours. No results for input(s): AMMONIA in the last 168 hours. Diabetic: No results for input(s): HGBA1C in the last 72 hours. Recent Labs  Lab 08/02/19 1135 08/02/19 1709 08/02/19 2109 08/03/19 0632 08/03/19 1153  GLUCAP 119* 99 106* 110* 105*   Cardiac Enzymes: No results for input(s): CKTOTAL, CKMB, CKMBINDEX, TROPONINI in the last  168 hours. No results for input(s): PROBNP in the last 8760 hours. Coagulation Profile: No results for input(s): INR, PROTIME in the last 168 hours. Thyroid Function Tests: No results for input(s): TSH, T4TOTAL, FREET4, T3FREE, THYROIDAB in the last 72 hours. Lipid Profile: No results for input(s): CHOL, HDL, LDLCALC, TRIG, CHOLHDL, LDLDIRECT in the last 72 hours. Anemia Panel: No results for input(s): VITAMINB12, FOLATE, FERRITIN, TIBC, IRON, RETICCTPCT in the last 72 hours. Urine analysis:    Component Value Date/Time   COLORURINE AMBER (A) 07/31/2019 1501   APPEARANCEUR CLEAR 07/31/2019 1501   APPEARANCEUR Cloudy (A) 09/22/2014 1015   LABSPEC 1.024 07/31/2019 1501   PHURINE 6.0 07/31/2019 1501   GLUCOSEU NEGATIVE 07/31/2019 1501   HGBUR SMALL (A) 07/31/2019 1501   BILIRUBINUR NEGATIVE 07/31/2019 1501   BILIRUBINUR neg 09/06/2018 1110   BILIRUBINUR Negative 09/22/2014 1015   KETONESUR 20 (A) 07/31/2019 1501   PROTEINUR 30 (A) 07/31/2019 1501   UROBILINOGEN 0.2 09/06/2018 1110   UROBILINOGEN 0.2 03/11/2013 2003   NITRITE NEGATIVE 07/31/2019 1501   LEUKOCYTESUR LARGE (A) 07/31/2019 1501   Sepsis Labs: Invalid input(s): PROCALCITONIN, Ava  Microbiology: Recent Results (from the past 240 hour(s))  SARS Coronavirus 2 [facility] order, Performed in [facility] hospital lab) Nasopharyngeal Nasopharyngeal Swab     Status: None   Collection Time: 07/25/19  7:39 AM   Specimen: Nasopharyngeal Swab  Result Value Ref Range Status   SARS Coronavirus 2 NEGATIVE NEGATIVE Final    Comment: (NOTE) If result is NEGATIVE SARS-CoV-2 target nucleic acids are NOT DETECTED. The SARS-CoV-2 RNA is generally detectable in upper and lower  respiratory specimens during the acute phase of infection. The lowest  concentration of SARS-CoV-2 viral copies this assay can detect is 250  copies / mL. A negative result does not preclude SARS-CoV-2 infection  and should not be used as the sole basis  for treatment or other  patient management decisions.  A negative result may occur with  improper specimen collection / handling, submission of specimen other  than nasopharyngeal swab, presence of viral mutation(s) within the  areas targeted by this assay, and inadequate number of viral copies  (<250 copies / mL). A negative result must be combined with clinical  observations, patient history, and epidemiological information. If result is POSITIVE SARS-CoV-2 target nucleic acids are DETECTED. The SARS-CoV-2 RNA is generally detectable in upper and lower  respiratory specimens dur ing the acute phase  of infection.  Positive  results are indicative of active infection with SARS-CoV-2.  Clinical  correlation with patient history and other diagnostic information is  necessary to determine patient infection status.  Positive results do  not rule out bacterial infection or co-infection with other viruses. If result is PRESUMPTIVE POSTIVE SARS-CoV-2 nucleic acids MAY BE PRESENT.   A presumptive positive result was obtained on the submitted specimen  and confirmed on repeat testing.  While 2019 novel coronavirus  (SARS-CoV-2) nucleic acids may be present in the submitted sample  additional confirmatory testing may be necessary for epidemiological  and / or clinical management purposes  to differentiate between  SARS-CoV-2 and other Sarbecovirus currently known to infect humans.  If clinically indicated additional testing with an alternate test  methodology [phone]) is advised. The SARS-CoV-2 RNA is generally  detectable in upper and lower respiratory sp ecimens during the acute  phase of infection. The expected result is Negative. Fact Sheet for Patients:  StrictlyIdeas.no Fact Sheet for Healthcare Providers: BankingDealers.co.za This test is not yet approved or cleared by the [place] FDA and has been authorized for detection and/or  diagnosis of SARS-CoV-2 by FDA under an Emergency Use Authorization (EUA).  This EUA will remain in effect (meaning this test can be used) for the duration of the COVID-19 declaration under Section 564(b)(1) of the Act, 21 U.S.C. section 360bbb-3(b)(1), unless the authorization is terminated or revoked sooner. Performed at [facility], [place] [address], [place], [place] [address]   Surgical PCR screen     Status: None   Collection Time: 07/25/19  5:34 PM   Specimen: Nasal Mucosa; Nasal Swab  Result Value Ref Range Status   MRSA, PCR NEGATIVE NEGATIVE Final   Staphylococcus aureus NEGATIVE NEGATIVE Final    Comment: (NOTE) The Xpert SA Assay (FDA approved for NASAL specimens in patients 48 years of age and older), is one component of a comprehensive surveillance program. It is not intended to diagnose infection nor to guide or monitor treatment. Performed at [facility], [place] [address], [place], [place] [address]   SARS CORONAVIRUS 2 (TAT 6-24 HRS) Nasopharyngeal Nasopharyngeal Swab     Status: None   Collection Time: 07/25/19  6:23 PM   Specimen: Nasopharyngeal Swab  Result Value Ref Range Status   SARS Coronavirus 2 NEGATIVE NEGATIVE Final    Comment: (NOTE) SARS-CoV-2 target nucleic acids are NOT DETECTED. The SARS-CoV-2 RNA is generally detectable in upper and lower respiratory specimens during the acute phase of infection. Negative results do not preclude SARS-CoV-2 infection, do not rule out co-infections with other pathogens, and should not be used as the sole basis for treatment or other patient management decisions. Negative results must be combined with clinical observations, patient history, and epidemiological information. The expected result is Negative. Fact Sheet for Patients: SugarRoll.be Fact Sheet for Healthcare Providers: https://www.woods-mathews.com/ This test is not yet approved or cleared  by the [place] FDA and  has been authorized for detection and/or diagnosis of SARS-CoV-2 by FDA under an Emergency Use Authorization (EUA). This EUA will remain  in effect (meaning this test can be used) for the duration of the COVID-19 declaration under Section 56 4(b)(1) of the Act, 21 U.S.C. section 360bbb-3(b)(1), unless the authorization is terminated or revoked sooner. Performed at [facility], [place] [address] [place], [place] [address]   SARS CORONAVIRUS 2 (TAT 6-24 HRS)     Status: None   Collection Time: 07/31/19  5:24  AM  Result Value Ref Range Status   SARS Coronavirus 2 NEGATIVE NEGATIVE Final    Comment: (NOTE) SARS-CoV-2 target nucleic acids are NOT DETECTED. The SARS-CoV-2 RNA is generally detectable in upper and lower respiratory specimens during the acute phase of infection. Negative results do not preclude SARS-CoV-2 infection, do not rule out co-infections with other pathogens, and should not be used as the sole basis for treatment or other patient management decisions. Negative results must be combined with clinical observations, patient history, and epidemiological information. The expected result is Negative. Fact Sheet for Patients: SugarRoll.be Fact Sheet for Healthcare Providers: https://www.woods-mathews.com/ This test is not yet approved or cleared by the [place] FDA and  has been authorized for detection and/or diagnosis of SARS-CoV-2 by FDA under an Emergency Use Authorization (EUA). This EUA will remain  in effect (meaning this test can be used) for the duration of the COVID-19 declaration under Section 56 4(b)(1) of the Act, 21 U.S.C. section 360bbb-3(b)(1), unless the authorization is terminated or revoked sooner. Performed at [facility], [place] [address] [place], [place] [address]   Culture, Urine     Status: Abnormal   Collection Time: 07/31/19 12:00 PM   Specimen: Urine,  Random  Result Value Ref Range Status   Specimen Description URINE, RANDOM  Final   Special Requests   Final    NONE Performed at [facility], [place] [address] [place], [place] [address]    Culture (A)  Final    80,000 COLONIES/mL MULTIPLE SPECIES PRESENT, SUGGEST RECOLLECTION   Report Status 08/01/2019 FINAL  Final  Culture, blood (routine x 2)     Status: None (Preliminary result)   Collection Time: 07/31/19  4:45 PM   Specimen: BLOOD RIGHT HAND  Result Value Ref Range Status   Specimen Description BLOOD RIGHT HAND  Final   Special Requests   Final    BOTTLES DRAWN AEROBIC ONLY Blood Culture adequate volume   Culture   Final    NO GROWTH 3 DAYS Performed at [facility], [place] [address], [place], [place] [address]    Report Status PENDING  Incomplete  Culture, blood (routine x 2)     Status: None (Preliminary result)   Collection Time: 07/31/19  4:45 PM   Specimen: BLOOD RIGHT HAND  Result Value Ref Range Status   Specimen Description BLOOD RIGHT HAND  Final   Special Requests   Final    BOTTLES DRAWN AEROBIC ONLY Blood Culture adequate volume   Culture   Final    NO GROWTH 3 DAYS Performed at [facility], [place] [address], [place], [place] [address]    Report Status PENDING  Incomplete    Radiology Studies: No results found.  Bonnell Public, M.D. [facility]  If 7PM-7AM, please contact night-coverage [email or web address] Password [facility] 08/03/2019, 3:57 PM

## 2019-08-04 ENCOUNTER — Encounter (HOSPITAL_COMMUNITY): Payer: Self-pay | Admitting: *Deleted

## 2019-08-04 LAB — GLUCOSE, CAPILLARY
Glucose-Capillary: 102 mg/dL — ABNORMAL HIGH (ref 70–99)
Glucose-Capillary: 103 mg/dL — ABNORMAL HIGH (ref 70–99)
Glucose-Capillary: 105 mg/dL — ABNORMAL HIGH (ref 70–99)
Glucose-Capillary: 97 mg/dL (ref 70–99)

## 2019-08-04 LAB — CBC WITH DIFFERENTIAL/PLATELET
Abs Immature Granulocytes: 0.14 10*3/uL — ABNORMAL HIGH (ref 0.00–0.07)
Basophils Absolute: 0.1 10*3/uL (ref 0.0–0.1)
Basophils Relative: 1 %
Eosinophils Absolute: 0.2 10*3/uL (ref 0.0–0.5)
Eosinophils Relative: 2 %
HCT: 26.4 % — ABNORMAL LOW (ref 36.0–46.0)
Hemoglobin: 8.7 g/dL — ABNORMAL LOW (ref 12.0–15.0)
Immature Granulocytes: 2 %
Lymphocytes Relative: 16 %
Lymphs Abs: 1.4 10*3/uL (ref 0.7–4.0)
MCH: 30.5 pg (ref 26.0–34.0)
MCHC: 33 g/dL (ref 30.0–36.0)
MCV: 92.6 fL (ref 80.0–100.0)
Monocytes Absolute: 0.7 10*3/uL (ref 0.1–1.0)
Monocytes Relative: 8 %
Neutro Abs: 6.3 10*3/uL (ref 1.7–7.7)
Neutrophils Relative %: 71 %
Platelets: 303 10*3/uL (ref 150–400)
RBC: 2.85 MIL/uL — ABNORMAL LOW (ref 3.87–5.11)
RDW: 16.9 % — ABNORMAL HIGH (ref 11.5–15.5)
WBC: 8.7 10*3/uL (ref 4.0–10.5)
nRBC: 0.2 % (ref 0.0–0.2)

## 2019-08-04 MED ORDER — PRO-STAT SUGAR FREE PO LIQD
30.0000 mL | Freq: Two times a day (BID) | ORAL | Status: DC
  Administered 2019-08-04 – 2019-08-06 (×5): 30 mL via ORAL
  Filled 2019-08-04 (×4): qty 30

## 2019-08-04 MED ORDER — PANTOPRAZOLE SODIUM 40 MG PO TBEC
40.0000 mg | DELAYED_RELEASE_TABLET | Freq: Two times a day (BID) | ORAL | Status: DC
  Administered 2019-08-04 – 2019-08-06 (×4): 40 mg via ORAL
  Filled 2019-08-04 (×4): qty 1

## 2019-08-04 MED ORDER — PROMETHAZINE HCL 25 MG PO TABS
12.5000 mg | ORAL_TABLET | Freq: Four times a day (QID) | ORAL | Status: DC | PRN
  Administered 2019-08-04 (×2): 12.5 mg via ORAL
  Filled 2019-08-04 (×2): qty 1

## 2019-08-04 MED ORDER — CEPHALEXIN 500 MG PO CAPS
500.0000 mg | ORAL_CAPSULE | Freq: Three times a day (TID) | ORAL | Status: DC
  Administered 2019-08-04 – 2019-08-06 (×6): 500 mg via ORAL
  Filled 2019-08-04 (×6): qty 1

## 2019-08-04 NOTE — Progress Notes (Signed)
Pt has not voided since removal of foley last evening; bladder scanned, 150ml noted in bladder. On call provider notified, no new orders at this time.

## 2019-08-04 NOTE — Progress Notes (Signed)
Pt bladder scanned, 80ml noted to be in bladder. Foley removed at this time, placed purewick to pt to continue to measure UOP.

## 2019-08-04 NOTE — Progress Notes (Signed)
  Patient Name: Andrea [name] MRN: [id number] DOB: 1935/01/22 Referring Physician: [name] (Profile Not Attached) Date of Service: 06/24/2019 [facility], [place]                                                        End Of Treatment Note  Diagnoses: C50.412-Malignant neoplasm of upper-outer quadrant of left female breast     Cancer Staging Malignant neoplasm of upper-outer quadrant of left breast in female, estrogen receptor positive ([place]) Staging form: Breast, AJCC 8th Edition - Clinical: Stage IA (cT1c, cN0, cM0, G3, ER+, PR+, HER2+) - Signed by Gardenia Phlegm, NP on 03/12/2019    - Pathologic Stage pT2, pN0 Left Breast UOQ Invasive Ductal Carcinoma, ER(+) / PR(+) / Her2(+), Grade 2   Intent: Curative  Radiation Treatment Dates: 05/28/2019 through 06/24/2019 Site Technique Total Dose (Gy) Dose per Fx Completed Fx Beam Energies  Breast: Breast_Lt 3D 40.05/40.05 2.67 15/15 6X  Breast: Breast_Lt_Bst 3D 10/10 2 5/5 6X   Narrative: The patient tolerated radiation therapy relatively well.   Plan: The patient will follow-up with radiation oncology in 1 month -----------------------------------  Eppie Gibson, MD

## 2019-08-04 NOTE — Progress Notes (Signed)
Nutrition Follow-up   RD working remotely.  DOCUMENTATION CODES:   Obesity unspecified  INTERVENTION:  Provide 30 ml Prostat po BID, each supplement provides 100 kcal and 15 grams of protein.   Encourage adequate PO intake.   NUTRITION DIAGNOSIS:   Increased nutrient needs related to post-op healing, wound healing as evidenced by estimated needs; ongoing  GOAL:   Patient will meet greater than or equal to 90% of their needs; progressing  MONITOR:   PO intake, Supplement acceptance, Labs, Weight trends, Skin, I & O's  REASON FOR ASSESSMENT:   Consult Wound healing, Hip fracture protocol  ASSESSMENT:   84 year old female who presented to the ED on 9/04 with c/o right knee pain after falling. PMH of DVT, atrial fibrillation, breast cancer s/p surgery and XRT currently receiving chemotherapy, T2DM. Pt found to have right periprosthetic distal femur fracture.  9/05 - s/p ORIF right femur periprosthetic fracture, VAC placement  Per MD, encephalopathy resolving. Meal completion has been 60-85%. Noted Ensure Max no longer ordered. RD to order nutritional supplements to aid in caloric and protein needs as well as in wound healing. Labs and medications reviewed.   Diet Order:   Diet Order            Diet general        Diet Carb Modified Fluid consistency: Thin; Room service appropriate? Yes  Diet effective now              EDUCATION NEEDS:   No education needs have been identified at this time  Skin:  Skin Assessment: Skin Integrity Issues: Skin Integrity Issues:: Incisions, Wound VAC Wound Vac: R leg Incisions: R leg  Last BM:  9/12  Height:   Ht Readings from Last 1 Encounters:  07/25/19 5\' 1"  (1.549 m)    Weight:   Wt Readings from Last 1 Encounters:  07/31/19 87.8 kg    Ideal Body Weight:  47.7 kg  BMI:  Body mass index is 36.57 kg/m.  Estimated Nutritional Needs:   Kcal:  1500-1700  Protein:  85-100 grams  Fluid:  1.5-1.7  L    Corrin Parker, MS, RD, LDN Pager # [phone] After hours/ weekend pager # [phone]

## 2019-08-04 NOTE — Progress Notes (Signed)
PROGRESS NOTE  Andrea [name] [id number] DOB: Jul 29, 1935   PCP: Gayland Curry, DO  Patient is from: home  DOA: 07/25/2019 LOS: 38  Brief Narrative / Interim history: 84 y.o.femalewith history ofA. fib on Eliquis, remote bilateral TKA, left breast cancer status post lumpectomy and radiation now on chemo, OSA on CPAP, hypothyroidism, overactive bladder, depression/anxiety and DM-2 presenting with mechanical fall at home and acute displaced spiral fracture of the distal femoral metadiaphysis.  She underwent open reduction internal fixation of right femoral with application of wound VAC on 07/26/2019 by Dr. Lyla Glassing.  Postop course complicated by acute encephalopathy/drowsiness without focal neuro deficit.  Work-up including CT head and urinalysis were negative.  She has no elevated bicarb to suggest CO2 retention.  She she was on low-dose Remeron but not taking pain medications.   Patient had abdominal pain, fever to 100.3 (geriatric) and leukocytosis to 14.5 with bandemia on 9/10.  She continues to look drowsy and weak.  Had 4 BMs charted over the last 24 hours but has been receiving Colace.  Obtained UA, urine culture, chest x-ray and CT abdomen and pelvis.  08/03/2019: Patient seen.  No new complaints.  Patient reports feeling better today.  Patient seems more coherent today.  H&H is stable.  Will repeat urinalysis and urine culture.  Will restart patient's Eliquis, and will monitor H&H closely.  08/04/2019: Patient continues to improve.  Patient stable for discharge once nausea vomiting resolved.  Altered mentation has improved significantly.  Subjective: Nausea and vomiting reported. No fever or chills No chest pain No nausea vomiting No shortness of breath  Objective: Vitals:   08/03/19 1542 08/03/19 1944 08/04/19 0327 08/04/19 0930  BP: (!) 134/55 (!) 111/52 (!) 129/53 (!) 142/59  Pulse: 92 71 77 70  Resp: 15   18  Temp: 99.4 F (37.4 C) 98 F (36.7 C) 97.7 F (36.5 C)  98.2 F (36.8 C)  TempSrc: Oral Oral Oral Oral  SpO2: 96% 98% 99% 100%  Weight:      Height:        Intake/Output Summary (Last 24 hours) at 08/04/2019 1651 Last data filed at 08/03/2019 2330 Gross per 24 hour  Intake 114 ml  Output 625 ml  Net -511 ml   Filed Weights   07/28/19 0500 07/29/19 0500 07/31/19 0500  Weight: 92.2 kg 84.6 kg 87.8 kg    Examination:  GENERAL: No acute distress.  Awake and alert.  Patient is communicative. HEENT: MILD PALLOR.  NO JAUNDICE.    NECK: Supple.  No apparent JVD.  RESP: Clear to auscultation. CVS: S1-S2. ABD: Obese, soft and nontender.  Organs are difficult to assess.   MSK/EXT: Right lower extremity is swollen from recent surgery referral distal right femoral fracture.   NEURO: Awake, alert and coherent.  Patient moves all extremities.  Assessment & Plan: Periprosthetic displaced right distal femoral fracture -ORIF right femur, wound VAC in place, touchdown weightbearing on the right lower extremity.  -Restart Eliquis.  Monitor H/H closely.  Hemoglobin today is 9.1 g/dL. -Praveena wound VAC on discharge -PT recommending SNF for ongoing PT/OT.  Acute metabolic encephalopathy/drowsiness: Likely delirium.  She has waxing and waning mental status. No history of dementia.  Extensive work-up for encephalopathy with unremarkable CT head, CXR, CT A/P, TSH and vitamin B12.  However, had mild fever, leukocytosis and LABP.  Urinalysis concerning for UTI and dehydration with ketonuria. -IV ceftriaxone on 9/10>>. -Urine culture with 80,000 colonies of multiple species -Follow repeat urine culture-could be  falsely negative given antibiotics. -Delirium precautions. 08/03/2019: Encephalopathy seems to be resolving.  Leukocytosis persists.  Will repeat UA and urine culture. 08/04/2019: Encephalopathy has resolved significantly.  Repeat UA is nonrevealing.  Leukocytosis has resolved.  Dehydration:  Likely due to poor p.o. intake -Resolved  significantly.   08/04/2019: Continue to monitor closely.  Orthostatic hypotension: Not on BP meds. -PT/OT and TED hose -If no improvement, will consider midodrine  Paroxysmal A. fib, currently in sinus rhythm -Eliquis as above -NSR without medications.  Acute blood loss anemia: Anemia panel consistent with iron deficiency. -Hgb 12 (admit) >> 6.8 (postop) > 1u> 8.0 >> 6.8>1u>  -Asked RN to secure 2 good IV lines -Received IV Feraheme 9/10 denies no mechanical noise -Hemoccult 08/03/2019: Hemoglobin is stable.  Continue to monitor closely.  Hypothyroidism: TSH within normal. -Continue Synthroid  OSA on CPAP -Encourage nightly CPAP-reportedly has been refusing.  Left breast cancer status post lumpectomy and radiation, now on chemotherapy -Followed by Dr. Lindi Adie as an outpatient  Tobacco use -Nicotine patch  Anxiety/depression -Reduced home Cymbalta to 60 mg although this is activating.  -Hold home Remeron and trazodone.  Nutrition: Reportedly poor p.o. intake per RN. -Appreciate dietitian input -Check prealbumin level.  Nausea and vomiting: Managed supportively.  DVT prophylaxis: On Eliquis Code Status: DNR/DNI Family Communication:  Disposition Plan: Remains inpatient to ensure stable Consultants: Orthopedic surgery  Procedures:  9/5-ORIF by Dr. Lyla Glassing. MRSA PCR negative.  Microbiology summarized: [id number] negative.  Antimicrobials: Anti-infectives (From admission, onward)   Start     Dose/Rate Route Frequency Ordered Stop   08/04/19 1400  cephALEXin (KEFLEX) capsule 500 mg     500 mg Oral Every 8 hours 08/04/19 1011 08/07/19 1359   07/31/19 1615  cefTRIAXone (ROCEPHIN) 1 g in sodium chloride 0.9 % 100 mL IVPB  Status:  Discontinued     1 g 200 mL/hr over 30 Minutes Intravenous Every 24 hours 07/31/19 1600 08/04/19 1011   07/26/19 1430  ceFAZolin (ANCEF) IVPB 2g/100 mL premix     2 g 200 mL/hr over 30 Minutes Intravenous Every 6 hours 07/26/19 1353  07/26/19 2335   07/26/19 0645  ceFAZolin (ANCEF) IVPB 2g/100 mL premix     2 g 200 mL/hr over 30 Minutes Intravenous On call to O.R. 07/26/19 [id number] 07/26/19 0903   07/26/19 0641  ceFAZolin (ANCEF) 2-4 GM/100ML-% IVPB    Note to Pharmacy: Henrine Screws   : cabinet override      07/26/19 0641 07/26/19 0833      Sch Meds:  Scheduled Meds: . apixaban  5 mg Oral BID  . bethanechol  10 mg Oral TID  . cephALEXin  500 mg Oral Q8H  . DULoxetine  60 mg Oral Daily  . feeding supplement (PRO-STAT SUGAR FREE 64)  30 mL Oral BID  . ferrous sulfate  325 mg Oral BID WC  . insulin aspart  0-5 Units Subcutaneous QHS  . insulin aspart  0-9 Units Subcutaneous TID WC  . levothyroxine  50 mcg Oral Q0600  . nicotine  7 mg Transdermal Daily  . pantoprazole  40 mg Oral BID  . vitamin B-12  500 mcg Oral Daily   Continuous Infusions:  PRN Meds:.acetaminophen, bisacodyl, menthol-cetylpyridinium **OR** phenol, oxyCODONE, promethazine, senna-docusate, sodium phosphate, zolpidem   I have personally reviewed the following labs and images: CBC: Recent Labs  Lab 07/31/19 0905 08/01/19 0630 08/02/19 0712 08/02/19 0941 08/02/19 2134 08/03/19 0441 08/04/19 0407  WBC 14.5* 9.4 11.8* 11.8*  --  12.9* 8.7  NEUTROABS 11.9*  --   --   --   --   --  6.3  HGB 8.6* 8.1* 6.8* 7.1* 9.4* 9.1* 8.7*  HCT 26.2* 24.6* 20.9* 21.2* 28.9* 27.3* 26.4*  MCV 88.8 88.2 90.5 89.8  --  89.2 92.6  PLT 264 269 340 317  --  316 303   BMP &GFR Recent Labs  Lab 07/30/19 0428 07/31/19 0425 08/01/19 0630 08/02/19 0537 08/03/19 0441  NA 132* 132* 135 134* 133*  K 3.7 3.8 3.7 3.5 4.1  CL 99 99 102 103 102  CO2 23 23 24 22  21*  GLUCOSE 118* 125* 95 111* 114*  BUN 20 24* 16 24* 21  CREATININE 0.67 0.71 0.72 0.71 0.64  CALCIUM 8.2* 8.2* 8.2* 8.1* 8.3*  MG  --  1.9 1.9 1.7 1.8  PHOS  --  2.5  --   --  2.7   Estimated Creatinine Clearance: 54.6 mL/min (by C-G formula based on SCr of 0.64 mg/dL). Liver & Pancreas: Recent  Labs  Lab 07/30/19 0428 07/31/19 0425  AST 28 24  ALT 28 26  ALKPHOS 51 56  BILITOT 1.2 0.8  PROT 5.3* 5.4*  ALBUMIN 2.8* 2.8*   No results for input(s): LIPASE, AMYLASE in the last 168 hours. No results for input(s): AMMONIA in the last 168 hours. Diabetic: No results for input(s): HGBA1C in the last 72 hours. Recent Labs  Lab 08/03/19 1153 08/03/19 1626 08/03/19 2118 08/04/19 0635 08/04/19 1204  GLUCAP 105* 120* 105* 102* 103*   Cardiac Enzymes: No results for input(s): CKTOTAL, CKMB, CKMBINDEX, TROPONINI in the last 168 hours. No results for input(s): PROBNP in the last 8760 hours. Coagulation Profile: No results for input(s): INR, PROTIME in the last 168 hours. Thyroid Function Tests: No results for input(s): TSH, T4TOTAL, FREET4, T3FREE, THYROIDAB in the last 72 hours. Lipid Profile: No results for input(s): CHOL, HDL, LDLCALC, TRIG, CHOLHDL, LDLDIRECT in the last 72 hours. Anemia Panel: No results for input(s): VITAMINB12, FOLATE, FERRITIN, TIBC, IRON, RETICCTPCT in the last 72 hours. Urine analysis:    Component Value Date/Time   COLORURINE YELLOW 08/03/2019 1838   APPEARANCEUR CLEAR 08/03/2019 1838   APPEARANCEUR Cloudy (A) 09/22/2014 1015   LABSPEC 1.011 08/03/2019 1838   PHURINE 6.0 08/03/2019 1838   GLUCOSEU NEGATIVE 08/03/2019 1838   HGBUR SMALL (A) 08/03/2019 1838   BILIRUBINUR NEGATIVE 08/03/2019 1838   BILIRUBINUR neg 09/06/2018 1110   BILIRUBINUR Negative 09/22/2014 1015   KETONESUR 5 (A) 08/03/2019 1838   PROTEINUR NEGATIVE 08/03/2019 1838   UROBILINOGEN 0.2 09/06/2018 1110   UROBILINOGEN 0.2 03/11/2013 2003   NITRITE NEGATIVE 08/03/2019 1838   LEUKOCYTESUR NEGATIVE 08/03/2019 1838   Sepsis Labs: Invalid input(s): PROCALCITONIN, [place]  Microbiology: Recent Results (from the past 240 hour(s))  Surgical PCR screen     Status: None   Collection Time: 07/25/19  5:34 PM   Specimen: Nasal Mucosa; Nasal Swab  Result Value Ref Range Status    MRSA, PCR NEGATIVE NEGATIVE Final   Staphylococcus aureus NEGATIVE NEGATIVE Final    Comment: (NOTE) The Xpert SA Assay (FDA approved for NASAL specimens in patients 79 years of age and older), is one component of a comprehensive surveillance program. It is not intended to diagnose infection nor to guide or monitor treatment. Performed at [facility], [place] [address], [place], [place] [address]   SARS CORONAVIRUS 2 (TAT 6-24 HRS) Nasopharyngeal Nasopharyngeal Swab     Status: None   Collection Time: 07/25/19  6:23 PM   Specimen: Nasopharyngeal Swab  Result Value Ref Range Status   SARS Coronavirus 2 NEGATIVE NEGATIVE Final    Comment: (NOTE) SARS-CoV-2 target nucleic acids are NOT DETECTED. The SARS-CoV-2 RNA is generally detectable in upper and lower respiratory specimens during the acute phase of infection. Negative results do not preclude SARS-CoV-2 infection, do not rule out co-infections with other pathogens, and should not be used as the sole basis for treatment or other patient management decisions. Negative results must be combined with clinical observations, patient history, and epidemiological information. The expected result is Negative. Fact Sheet for Patients: SugarRoll.be Fact Sheet for Healthcare Providers: https://www.woods-mathews.com/ This test is not yet approved or cleared by the [place] FDA and  has been authorized for detection and/or diagnosis of SARS-CoV-2 by FDA under an Emergency Use Authorization (EUA). This EUA will remain  in effect (meaning this test can be used) for the duration of the COVID-19 declaration under Section 56 4(b)(1) of the Act, 21 U.S.C. section 360bbb-3(b)(1), unless the authorization is terminated or revoked sooner. Performed at [facility], [place] [address] [place], [place] [address]   SARS CORONAVIRUS 2 (TAT 6-24 HRS)     Status: None   Collection Time: 07/31/19   5:24 AM  Result Value Ref Range Status   SARS Coronavirus 2 NEGATIVE NEGATIVE Final    Comment: (NOTE) SARS-CoV-2 target nucleic acids are NOT DETECTED. The SARS-CoV-2 RNA is generally detectable in upper and lower respiratory specimens during the acute phase of infection. Negative results do not preclude SARS-CoV-2 infection, do not rule out co-infections with other pathogens, and should not be used as the sole basis for treatment or other patient management decisions. Negative results must be combined with clinical observations, patient history, and epidemiological information. The expected result is Negative. Fact Sheet for Patients: SugarRoll.be Fact Sheet for Healthcare Providers: https://www.woods-mathews.com/ This test is not yet approved or cleared by the [place] FDA and  has been authorized for detection and/or diagnosis of SARS-CoV-2 by FDA under an Emergency Use Authorization (EUA). This EUA will remain  in effect (meaning this test can be used) for the duration of the COVID-19 declaration under Section 56 4(b)(1) of the Act, 21 U.S.C. section 360bbb-3(b)(1), unless the authorization is terminated or revoked sooner. Performed at [facility], [place] [address], [place], [place] [address]   Culture, Urine     Status: Abnormal   Collection Time: 07/31/19 12:00 PM   Specimen: Urine, Random  Result Value Ref Range Status   Specimen Description URINE, RANDOM  Final   Special Requests   Final    NONE Performed at [facility], [place] [address], [place], [place] [address]    Culture (A)  Final    80,000 COLONIES/mL MULTIPLE SPECIES PRESENT, SUGGEST RECOLLECTION   Report Status 08/01/2019 FINAL  Final  Culture, blood (routine x 2)     Status: None (Preliminary result)   Collection Time: 07/31/19  4:45 PM   Specimen: BLOOD RIGHT HAND  Result Value Ref Range Status   Specimen Description BLOOD RIGHT HAND  Final    Special Requests   Final    BOTTLES DRAWN AEROBIC ONLY Blood Culture adequate volume   Culture   Final    NO GROWTH 4 DAYS Performed at [facility], [place] [address], [place], [place] [address]    Report Status PENDING  Incomplete  Culture, blood (routine x 2)     Status: None (Preliminary  result)   Collection Time: 07/31/19  4:45 PM   Specimen: BLOOD RIGHT HAND  Result Value Ref Range Status   Specimen Description BLOOD RIGHT HAND  Final   Special Requests   Final    BOTTLES DRAWN AEROBIC ONLY Blood Culture adequate volume   Culture   Final    NO GROWTH 4 DAYS Performed at [facility], [address], [place], [place] [address]    Report Status PENDING  Incomplete    Radiology Studies: No results found.  Bonnell Public, M.D. [facility]  If 7PM-7AM, please contact night-coverage [email or web address] Password [facility] 08/04/2019, 4:51 PM

## 2019-08-04 NOTE — Clinical Social Work Note (Signed)
CSW updated [name], admissions Mudlogger at [facility] that patient not medically stable for discharge at this time.Patient will be followed by a CSW and will be discharged to a skilled facility [facility] if they have a bed when patient stable for discharge) once medically stable.  Chaysen Tillman Givens, MSW, LCSW Licensed Clinical Social Worker [place] [phone]

## 2019-08-04 NOTE — Progress Notes (Signed)
PT Cancellation Note  Patient Details Name: Andrea [name] MRN: [id number] DOB: 03/14/1935   Cancelled Treatment:    Reason Eval/Treat Not Completed: Other (comment) Attempted to see pt for PT treatment. Pt's family member reports that pt has been vomiting and that "it may not be a good time". Pt was given anti emesis medication however RN reports the pt is still nauseous. PT will continue to follow acutely.    Earney Navy, PTA Acute Rehabilitation Services Pager: [phone] Office: [phone]   08/04/2019, 3:48 PM

## 2019-08-05 LAB — CULTURE, BLOOD (ROUTINE X 2)
Culture: NO GROWTH
Culture: NO GROWTH
Special Requests: ADEQUATE
Special Requests: ADEQUATE

## 2019-08-05 LAB — CBC WITH DIFFERENTIAL/PLATELET
Abs Immature Granulocytes: 0.06 10*3/uL (ref 0.00–0.07)
Basophils Absolute: 0 10*3/uL (ref 0.0–0.1)
Basophils Relative: 0 %
Eosinophils Absolute: 0.1 10*3/uL (ref 0.0–0.5)
Eosinophils Relative: 1 %
HCT: 25.8 % — ABNORMAL LOW (ref 36.0–46.0)
Hemoglobin: 8.8 g/dL — ABNORMAL LOW (ref 12.0–15.0)
Immature Granulocytes: 1 %
Lymphocytes Relative: 10 %
Lymphs Abs: 0.9 10*3/uL (ref 0.7–4.0)
MCH: 31.2 pg (ref 26.0–34.0)
MCHC: 34.1 g/dL (ref 30.0–36.0)
MCV: 91.5 fL (ref 80.0–100.0)
Monocytes Absolute: 0.6 10*3/uL (ref 0.1–1.0)
Monocytes Relative: 6 %
Neutro Abs: 7.6 10*3/uL (ref 1.7–7.7)
Neutrophils Relative %: 82 %
Platelets: 313 10*3/uL (ref 150–400)
RBC: 2.82 MIL/uL — ABNORMAL LOW (ref 3.87–5.11)
RDW: 17.3 % — ABNORMAL HIGH (ref 11.5–15.5)
WBC: 9.2 10*3/uL (ref 4.0–10.5)
nRBC: 0 % (ref 0.0–0.2)

## 2019-08-05 LAB — URINE CULTURE: Culture: NO GROWTH

## 2019-08-05 LAB — COMPREHENSIVE METABOLIC PANEL
ALT: 18 U/L (ref 0–44)
AST: 22 U/L (ref 15–41)
Albumin: 2.6 g/dL — ABNORMAL LOW (ref 3.5–5.0)
Alkaline Phosphatase: 57 U/L (ref 38–126)
Anion gap: 11 (ref 5–15)
BUN: 13 mg/dL (ref 8–23)
CO2: 23 mmol/L (ref 22–32)
Calcium: 8.6 mg/dL — ABNORMAL LOW (ref 8.9–10.3)
Chloride: 101 mmol/L (ref 98–111)
Creatinine, Ser: 0.64 mg/dL (ref 0.44–1.00)
GFR calc Af Amer: >60 mL/min (ref >60)
GFR calc non Af Amer: >60 mL/min (ref >60)
Glucose, Bld: 109 mg/dL — ABNORMAL HIGH (ref 70–99)
Potassium: 3.6 mmol/L (ref 3.5–5.1)
Sodium: 135 mmol/L (ref 135–145)
Total Bilirubin: 0.9 mg/dL (ref 0.3–1.2)
Total Protein: 5.2 g/dL — ABNORMAL LOW (ref 6.5–8.1)

## 2019-08-05 LAB — GLUCOSE, CAPILLARY
Glucose-Capillary: 102 mg/dL — ABNORMAL HIGH (ref 70–99)
Glucose-Capillary: 105 mg/dL — ABNORMAL HIGH (ref 70–99)
Glucose-Capillary: 117 mg/dL — ABNORMAL HIGH (ref 70–99)
Glucose-Capillary: 153 mg/dL — ABNORMAL HIGH (ref 70–99)

## 2019-08-05 LAB — MAGNESIUM: Magnesium: 1.7 mg/dL (ref 1.7–2.4)

## 2019-08-05 NOTE — Progress Notes (Signed)
Physical Therapy Treatment Patient Details Name: Andrea [name] MRN: [id number] DOB: [birth year] Today's Date: 08/05/2019    History of Present Illness Andrea [name] is a 84 y.o. female with history of A. fib on Eliquis, remote bilateral TKA, left breast cancer status post lumpectomy and radiation now on chemo, OSA on CPAP, hypothyroidism, overactive bladder, depression/anxiety and DM-2 presenting with mechanical fall at home resulting in distal femur periprosthetic fx, no s/p ORIF, TWB, unlimited ROM knee    PT Comments    Pt performed LE exercises and progression OOB to standing.  From elevated surface she required min +2 from lower surface she required mod +2.  Pt continues to have episodes of blank stares and slow processing.  RN informed.    Pt did tolerate standing better this session.  Plan for progression to standing with RW with possible gt if able to maintain weight bearing.  Continue to recommend SNF placement at this time.     Follow Up Recommendations  SNF     Equipment Recommendations  (TBD)    Recommendations for Other Services       Precautions / Restrictions Precautions Precautions: Fall Restrictions Weight Bearing Restrictions: Yes RLE Weight Bearing: Partial weight bearing RLE Partial Weight Bearing Percentage or Pounds: 30% per MD    Mobility  Bed Mobility Overal bed mobility: Needs Assistance Bed Mobility: Supine to Sit     Supine to sit: Min assist;+2 for physical assistance     General bed mobility comments: assist to manuever RLE off of bed and scoot hips to EOB,  Pt required extended amount of time.  Transfers Overall transfer level: Needs assistance Equipment used: Ambulation equipment used(stedy) Transfers: Sit to/from Stand Sit to Stand: Min assist;Mod assist;+2 physical assistance         General transfer comment: MIN A +2 from EOB ( elevate surface); MOD A +2 from regular toilet; Assist to intially power up into standing; cues for  weight bearing restrictions  Ambulation/Gait                 Stairs             Wheelchair Mobility    Modified Rankin (Stroke Patients Only)       Balance Overall balance assessment: Needs assistance Sitting-balance support: No upper extremity supported;Feet supported Sitting balance-[name]: Fair Sitting balance - Comments: able to static sit with close min guard   Standing balance support: Bilateral upper extremity supported Standing balance-[name]: Poor Standing balance comment: reliant on external support                            Cognition Arousal/Alertness: Lethargic Behavior During Therapy: Flat affect Overall Cognitive Status: Impaired/Different from baseline Area of Impairment: Orientation;Attention;Following commands;Awareness                 Orientation Level: Disoriented to;Time Current Attention Level: Focused   Following Commands: Follows one step commands inconsistently;Follows one step commands with increased time   Awareness: Intellectual   General Comments: pt very flat with several episodes of pt starring off no responding to commands; increased time required for initiation      Exercises General Exercises - Lower Extremity Ankle Circles/Pumps: AROM;Both;10 reps;Supine Quad Sets: AROM;Right;10 reps;Supine Heel Slides: AROM;Right;10 reps;Supine Hip ABduction/ADduction: AROM;Right;10 reps;Supine    General Comments        Pertinent Vitals/Pain Pain Assessment: Faces Faces Pain Scale: Hurts little more Pain Location: R hip  Pain Descriptors / Indicators: Discomfort Pain Intervention(s): Monitored during session;Limited activity within patient's tolerance    Home Living                      Prior Function            PT Goals (current goals can now be found in the care plan section) Acute Rehab PT Goals Patient Stated Goal: none stated Potential to Achieve Goals: Fair Progress towards  PT goals: Progressing toward goals    Frequency    Min 3X/week      PT Plan Current plan remains appropriate    Co-evaluation   Reason for Co-Treatment: Necessary to address cognition/behavior during functional activity;For patient/therapist safety;To address functional/ADL transfers   OT goals addressed during session: ADL's and self-care      AM-PAC PT "6 Clicks" Mobility   Outcome Measure  Help needed turning from your back to your side while in a flat bed without using bedrails?: A Lot Help needed moving from lying on your back to sitting on the side of a flat bed without using bedrails?: A Lot Help needed moving to and from a bed to a chair (including a wheelchair)?: A Lot Help needed standing up from a chair using your arms (e.g., wheelchair or bedside chair)?: A Lot Help needed to walk in hospital room?: Total Help needed climbing 3-5 steps with a railing? : Total 6 Click Score: 10    End of Session Equipment Utilized During Treatment: Gait belt Activity Tolerance: Patient limited by fatigue;Patient limited by pain Patient left: in bed;with bed alarm set;with call bell/phone within reach Nurse Communication: Mobility status PT Visit Diagnosis: Other abnormalities of gait and mobility (R26.89);Pain;Unsteadiness on feet (R26.81) Pain - Right/Left: Right Pain - part of body: Leg     Time: [id number] PT Time Calculation (min) (ACUTE ONLY): 36 min  Charges:  $Therapeutic Activity: 8-22 mins                     Governor Rooks, PTA Acute Rehabilitation Services Pager [phone] Office [phone]     Andrea [name] 08/05/2019, 4:15 PM

## 2019-08-05 NOTE — Care Management (Addendum)
CSW updated Andrea [name], admissions Mudlogger at [facility] that patient is nearing medically stability to transition to rehab. Per Renard [facility]per should have a bed available if patient is medically stable for discharge this week., with no covid test required, if she discharges within the next 1-2 days, since last test was negative on 07/31/19. CM updated Dr. Doristine Bosworth.  Midge Minium RN, BSN, NCM-BC, ACM-RN [phone]

## 2019-08-05 NOTE — Progress Notes (Signed)
Occupational Therapy Treatment Patient Details Name: Andrea [name] MRN: [id number] DOB: [birth year] Today's Date: 08/05/2019    History of present illness Andrea [name] is a 84 y.o. female with history of A. fib on Eliquis, remote bilateral TKA, left breast cancer status post lumpectomy and radiation now on chemo, OSA on CPAP, hypothyroidism, overactive bladder, depression/anxiety and DM-2 presenting with mechanical fall at home resulting in distal femur periprosthetic fx, no s/p ORIF, TWB, unlimited ROM knee   OT comments  Pt seen with PT for functional mobility progression. Overall, pt MIN +2 for bed mobility to advance RLE to EOB and scoot hip forward. MIN guard +2 for initial sit>stand from EOB with stedy but MOD +2 for sit>stand from regular toilet using stedy. Total A for pericare after BM. Pt complete seated grooming of oral care in recliner with MIN cues to initiate task and MOD to clean dentures. Pt required verbal cues throughout session to remind pt to carryover WB status functionally. Pt is slow to initiate and requires increased time to follow commands. Frequent episodes of pt "starring off" needing to be redirected to task.  DC plan remains appropriate. Will continue to follow for acute OT needs.   Follow Up Recommendations  SNF;Supervision/Assistance - 24 hour    Equipment Recommendations  Other (comment)(tbd to next venue)    Recommendations for Other Services      Precautions / Restrictions Precautions Precautions: Fall Restrictions Weight Bearing Restrictions: Yes RLE Weight Bearing: Partial weight bearing RLE Partial Weight Bearing Percentage or Pounds: 30       Mobility Bed Mobility Overal bed mobility: Needs Assistance Bed Mobility: Supine to Sit     Supine to sit: Min assist;+2 for physical assistance     General bed mobility comments: assist to manuever RLE off of bed and scoot hips to EOB  Transfers Overall transfer level: Needs  assistance Equipment used: Ambulation equipment used(stedy) Transfers: Sit to/from Stand Sit to Stand: Min assist;Mod assist;+2 physical assistance         General transfer comment: MIN A +2 from EOB; MOD A +2 from regular toilet; Assist to intially power up into standing; cues for weight bearing restrictions    Balance Overall balance assessment: Needs assistance Sitting-balance support: No upper extremity supported;Feet supported Sitting balance-[name]: Fair Sitting balance - Comments: able to static sit with close min guard   Standing balance support: Bilateral upper extremity supported Standing balance-[name]: Poor Standing balance comment: reliant on external support                           ADL either performed or assessed with clinical judgement   ADL Overall ADL's : Needs assistance/impaired     Grooming: Oral care;Sitting;Moderate assistance Grooming Details (indicate cue type and reason): MOD A to clean dentures; MIN cues to initiate putting dentures back in                 Toilet Transfer: +2 for physical assistance;+2 for safety/equipment;Moderate assistance;Regular Toilet(stedy) Toilet Transfer Details (indicate cue type and reason): MOD +2 sit>stand off regular toilet; stedy for safety Toileting- Clothing Manipulation and Hygiene: +2 for safety/equipment;Total assistance;Sit to/from stand Toileting - Clothing Manipulation Details (indicate cue type and reason): standing in stedy     Functional mobility during ADLs: Maximal assistance(used stedy; only able to sit>stand) General ADL Comments: MIN verbal cues to initiate oral care of cleaning dentures; slow to process; slow to initiate  Vision       Perception     Praxis      Cognition Arousal/Alertness: Lethargic Behavior During Therapy: Flat affect Overall Cognitive Status: Impaired/Different from baseline Area of Impairment: Orientation;Attention;Following  commands;Awareness                 Orientation Level: Disoriented to;Time(able to recall name, place, and situation; thought it was April) Current Attention Level: Focused   Following Commands: Follows one step commands inconsistently;Follows one step commands with increased time   Awareness: Intellectual   General Comments: pt very flat with several episodes of pt starring off no responding to commands; increased time required for initiation        Exercises     Shoulder Instructions       General Comments      Pertinent Vitals/ Pain       Pain Assessment: Faces Faces Pain Scale: Hurts a little bit Pain Location: R hip Pain Descriptors / Indicators: Discomfort Pain Intervention(s): Monitored during session;Repositioned  Home Living                                          Prior Functioning/Environment              Frequency  Min 1X/week        Progress Toward Goals  OT Goals(current goals can now be found in the care plan section)  Progress towards OT goals: Progressing toward goals  Acute Rehab OT Goals Time For Goal Achievement: 08/12/19 Potential to Achieve Goals: [place] Discharge plan remains appropriate    Co-evaluation    PT/OT/SLP Co-Evaluation/Treatment: Yes Reason for Co-Treatment: Necessary to address cognition/behavior during functional activity;For patient/therapist safety;To address functional/ADL transfers   OT goals addressed during session: ADL's and self-care      AM-PAC OT "6 Clicks" Daily Activity     Outcome Measure   Help from another person eating meals?: A Lot Help from another person taking care of personal grooming?: A Lot Help from another person toileting, which includes using toliet, bedpan, or urinal?: A Lot Help from another person bathing (including washing, rinsing, drying)?: A Lot Help from another person to put on and taking off regular upper body clothing?: A Lot Help from another  person to put on and taking off regular lower body clothing?: A Lot 6 Click Score: 12    End of Session Equipment Utilized During Treatment: Gait belt(stedy)  OT Visit Diagnosis: Other abnormalities of gait and mobility (R26.89);Muscle weakness (generalized) (M62.81);Other symptoms and signs involving cognitive function   Activity Tolerance Patient tolerated treatment well   Patient Left in chair;with call bell/phone within reach;with bed alarm set   Nurse Communication Mobility status;Other (comment)(pt with black BM in toilet; needs to be looked at)        Time: [id number] OT Time Calculation (min): 33 min  Charges: OT General Charges $OT Visit: 1 Visit OT Treatments $Self Care/Home Management : 23-37 mins  [place], [facility] [phone] [place] 08/05/2019, 3:53 PM

## 2019-08-05 NOTE — Progress Notes (Signed)
PROGRESS NOTE  Andrea [name] [id number] DOB: 25-May-1935   PCP: Gayland Curry, DO  Patient is from: home  DOA: 07/25/2019 LOS: 11  Brief Narrative / Interim history: 84 y.o.femalewith history ofA. fib on Eliquis, remote bilateral TKA, left breast cancer status post lumpectomy and radiation now on chemo, OSA on CPAP, hypothyroidism, overactive bladder, depression/anxiety and DM-2 presented to ED on 07/25/2019 with mechanical fall at home and was diagnosed with acute displaced spiral fracture of the distal femoral metadiaphysis.  She was subsequently admitted to hospital service and orthopedics were consulted.  She underwent open reduction internal fixation of right femoral with application of wound VAC on 07/26/2019 by Dr. Lyla Glassing.  Postop course complicated by acute encephalopathy/drowsiness without focal neuro deficit.  Work-up including CT head and urinalysis were negative.   She she was on low-dose Remeron but not taking pain medications.   Patient had abdominal pain, fever to 100.3 (geriatric) and leukocytosis to 14.5 with bandemia on 9/10.  She continued to look drowsy and weak.  Obtained UA, urine culture, chest x-ray and CT abdomen and pelvis and all of them are unremarkable  Assessment & Plan: Periprosthetic displaced right distal femoral fracture -ORIF right femur, wound VAC in place, touchdown weightbearing on the right lower extremity.   Monitor H/H closely.  Hemoglobin today is 8.8. -Praveena wound VAC on discharge -PT recommending SNF for ongoing PT/OT.  Acute metabolic encephalopathy/drowsiness: Likely delirium.  . No history of dementia.  Extensive work-up for encephalopathy with unremarkable CT head, CXR, CT A/P, TSH and vitamin B12.  However, had mild fever, leukocytosis and LABP.  Urinalysis concerning for UTI and dehydration with ketonuria. -IV ceftriaxone on 9/10>>.  914 220 which was switched to Keflex -Urine culture with 80,000 colonies of multiple species  -Delirium precautions.  Leukocytosis resolved.  She is afebrile.  She is coherent.  Almost back to baseline.  Dehydration:  Resolved.  Orthostatic hypotension: Not on BP meds. -PT/OT and TED hose -If no improvement, will consider midodrine  Paroxysmal A. fib, currently in sinus rhythm -Eliquis as above -NSR without medications.  Acute blood loss anemia: Anemia panel consistent with iron deficiency. -Hgb 12 (admit) >> 6.8 (postop) > 1u> 8.0 >> 6.8>1u>  -Asked RN to secure 2 good IV lines -Received IV Feraheme 9/10 denies no mechanical noise -Hemoccult 08/03/2019: Hemoglobin is stable.  Continue to monitor closely.  Hypothyroidism: TSH within normal. -Continue Synthroid  OSA on CPAP -Encourage nightly CPAP-reportedly has been refusing.  Left breast cancer status post lumpectomy and radiation, now on chemotherapy -Followed by Dr. Lindi Adie as an outpatient  Tobacco use -Nicotine patch  Anxiety/depression -Reduced home Cymbalta to 60 mg although this is activating.  -Hold home Remeron and trazodone.  Nutrition: Reportedly poor p.o. intake per RN. -Appreciate dietitian input -Check prealbumin level.  Nausea and vomiting: Managed supportively.  DVT prophylaxis: On Eliquis Code Status: DNR/DNI Family Communication:  Disposition Plan: Remains inpatient to ensure stable.  Potential discharge tomorrow to SNF. Consultants: Orthopedic surgery  Procedures:  9/5-ORIF by Dr. Lyla Glassing. MRSA PCR negative.  Microbiology summarized: [id number] negative.   Subjective: Patient seen and examined.  Caregiver at the bedside.  Patient had no complaint.  She was comfortable.  She was oriented to the place and the [place].  He missed current month and year.  Objective: Vitals:   08/04/19 2320 08/05/19 0345 08/05/19 0500 08/05/19 0749  BP:  (!) 161/68  138/64  Pulse:  82  81  Resp:  17  16  Temp:  99.8 F (37.7 C) 98.2 F (36.8 C)  98.7 F (37.1 C)  TempSrc: Oral Oral  Oral   SpO2:  98%  98%  Weight:   100.2 kg   Height:        Intake/Output Summary (Last 24 hours) at 08/05/2019 1504 Last data filed at 08/05/2019 1244 Gross per 24 hour  Intake 840 ml  Output 2000 ml  Net -1160 ml   Filed Weights   07/29/19 0500 07/31/19 0500 08/05/19 0500  Weight: 84.6 kg 87.8 kg 100.2 kg    Examination:  General exam: Appears calm and comfortable  Respiratory system: Clear to auscultation. Respiratory effort normal. Cardiovascular system: S1 & S2 heard, RRR. No JVD, murmurs, rubs, gallops or clicks. No pedal edema. Gastrointestinal system: Abdomen is nondistended, soft and nontender. No organomegaly or masses felt. Normal bowel sounds heard. Central nervous system: Alert and oriented x2. No focal neurological deficits. Skin: No rashes, lesions or ulcers.  Psychiatry: Judgement and insight appear poor, mood & affect appropriate.    Antimicrobials: Anti-infectives (From admission, onward)   Start     Dose/Rate Route Frequency Ordered Stop   08/04/19 1400  cephALEXin (KEFLEX) capsule 500 mg     500 mg Oral Every 8 hours 08/04/19 1011 08/07/19 1359   07/31/19 1615  cefTRIAXone (ROCEPHIN) 1 g in sodium chloride 0.9 % 100 mL IVPB  Status:  Discontinued     1 g 200 mL/hr over 30 Minutes Intravenous Every 24 hours 07/31/19 1600 08/04/19 1011   07/26/19 1430  ceFAZolin (ANCEF) IVPB 2g/100 mL premix     2 g 200 mL/hr over 30 Minutes Intravenous Every 6 hours 07/26/19 1353 07/26/19 2335   07/26/19 0645  ceFAZolin (ANCEF) IVPB 2g/100 mL premix     2 g 200 mL/hr over 30 Minutes Intravenous On call to O.R. 07/26/19 [id number] 07/26/19 0903   07/26/19 0641  ceFAZolin (ANCEF) 2-4 GM/100ML-% IVPB    Note to Pharmacy: Henrine Screws   : cabinet override      07/26/19 0641 07/26/19 0833      Sch Meds:  Scheduled Meds: . apixaban  5 mg Oral BID  . bethanechol  10 mg Oral TID  . cephALEXin  500 mg Oral Q8H  . DULoxetine  60 mg Oral Daily  . feeding supplement (PRO-STAT SUGAR FREE  64)  30 mL Oral BID  . ferrous sulfate  325 mg Oral BID WC  . insulin aspart  0-5 Units Subcutaneous QHS  . insulin aspart  0-9 Units Subcutaneous TID WC  . levothyroxine  50 mcg Oral Q0600  . nicotine  7 mg Transdermal Daily  . pantoprazole  40 mg Oral BID  . vitamin B-12  500 mcg Oral Daily   Continuous Infusions:  PRN Meds:.acetaminophen, bisacodyl, menthol-cetylpyridinium **OR** phenol, oxyCODONE, promethazine, senna-docusate, sodium phosphate, zolpidem   I have personally reviewed the following labs and images: CBC: Recent Labs  Lab 07/31/19 0905  08/02/19 0712 08/02/19 0941 08/02/19 2134 08/03/19 0441 08/04/19 0407 08/05/19 1144  WBC 14.5*   < > 11.8* 11.8*  --  12.9* 8.7 9.2  NEUTROABS 11.9*  --   --   --   --   --  6.3 7.6  HGB 8.6*   < > 6.8* 7.1* 9.4* 9.1* 8.7* 8.8*  HCT 26.2*   < > 20.9* 21.2* 28.9* 27.3* 26.4* 25.8*  MCV 88.8   < > 90.5 89.8  --  89.2 92.6 91.5  PLT 264   < > 340  317  --  316 303 313   < > = values in this interval not displayed.   BMP &GFR Recent Labs  Lab 07/31/19 0425 08/01/19 0630 08/02/19 0537 08/03/19 0441 08/05/19 1144  NA 132* 135 134* 133* 135  K 3.8 3.7 3.5 4.1 3.6  CL 99 102 103 102 101  CO2 23 24 22  21* 23  GLUCOSE 125* 95 111* 114* 109*  BUN 24* 16 24* 21 13  CREATININE 0.71 0.72 0.71 0.64 0.64  CALCIUM 8.2* 8.2* 8.1* 8.3* 8.6*  MG 1.9 1.9 1.7 1.8 1.7  PHOS 2.5  --   --  2.7  --    Estimated Creatinine Clearance: 58.9 mL/min (by C-G formula based on SCr of 0.64 mg/dL). Liver & Pancreas: Recent Labs  Lab 07/30/19 0428 07/31/19 0425 08/05/19 1144  AST 28 24 22   ALT 28 26 18   ALKPHOS 51 56 57  BILITOT 1.2 0.8 0.9  PROT 5.3* 5.4* 5.2*  ALBUMIN 2.8* 2.8* 2.6*   No results for input(s): LIPASE, AMYLASE in the last 168 hours. No results for input(s): AMMONIA in the last 168 hours. Diabetic: No results for input(s): HGBA1C in the last 72 hours. Recent Labs  Lab 08/04/19 1204 08/04/19 1638 08/04/19 2138  08/05/19 0627 08/05/19 1136  GLUCAP 103* 105* 97 102* 105*   Cardiac Enzymes: No results for input(s): CKTOTAL, CKMB, CKMBINDEX, TROPONINI in the last 168 hours. No results for input(s): PROBNP in the last 8760 hours. Coagulation Profile: No results for input(s): INR, PROTIME in the last 168 hours. Thyroid Function Tests: No results for input(s): TSH, T4TOTAL, FREET4, T3FREE, THYROIDAB in the last 72 hours. Lipid Profile: No results for input(s): CHOL, HDL, LDLCALC, TRIG, CHOLHDL, LDLDIRECT in the last 72 hours. Anemia Panel: No results for input(s): VITAMINB12, FOLATE, FERRITIN, TIBC, IRON, RETICCTPCT in the last 72 hours. Urine analysis:    Component Value Date/Time   COLORURINE YELLOW 08/03/2019 1838   APPEARANCEUR CLEAR 08/03/2019 1838   APPEARANCEUR Cloudy (A) 09/22/2014 1015   LABSPEC 1.011 08/03/2019 1838   PHURINE 6.0 08/03/2019 1838   GLUCOSEU NEGATIVE 08/03/2019 1838   HGBUR SMALL (A) 08/03/2019 1838   BILIRUBINUR NEGATIVE 08/03/2019 1838   BILIRUBINUR neg 09/06/2018 1110   BILIRUBINUR Negative 09/22/2014 1015   KETONESUR 5 (A) 08/03/2019 1838   PROTEINUR NEGATIVE 08/03/2019 1838   UROBILINOGEN 0.2 09/06/2018 1110   UROBILINOGEN 0.2 03/11/2013 2003   NITRITE NEGATIVE 08/03/2019 1838   LEUKOCYTESUR NEGATIVE 08/03/2019 1838   Sepsis Labs: Invalid input(s): PROCALCITONIN, [place]  Microbiology: Recent Results (from the past 240 hour(s))  SARS CORONAVIRUS 2 (TAT 6-24 HRS)     Status: None   Collection Time: 07/31/19  5:24 AM  Result Value Ref Range Status   SARS Coronavirus 2 NEGATIVE NEGATIVE Final    Comment: (NOTE) SARS-CoV-2 target nucleic acids are NOT DETECTED. The SARS-CoV-2 RNA is generally detectable in upper and lower respiratory specimens during the acute phase of infection. Negative results do not preclude SARS-CoV-2 infection, do not rule out co-infections with other pathogens, and should not be used as the sole basis for treatment or other  patient management decisions. Negative results must be combined with clinical observations, patient history, and epidemiological information. The expected result is Negative. Fact Sheet for Patients: SugarRoll.be Fact Sheet for Healthcare Providers: https://www.woods-mathews.com/ This test is not yet approved or cleared by the [place] FDA and  has been authorized for detection and/or diagnosis of SARS-CoV-2 by FDA under an Emergency Use Authorization (EUA).  This EUA will remain  in effect (meaning this test can be used) for the duration of the COVID-19 declaration under Section 56 4(b)(1) of the Act, 21 U.S.C. section 360bbb-3(b)(1), unless the authorization is terminated or revoked sooner. Performed at [facility], [place] [address] [place], [place] [address]   Culture, Urine     Status: Abnormal   Collection Time: 07/31/19 12:00 PM   Specimen: Urine, Random  Result Value Ref Range Status   Specimen Description URINE, RANDOM  Final   Special Requests   Final    NONE Performed at [facility], [place] [address], [place], [place] [address]    Culture (A)  Final    80,000 COLONIES/mL MULTIPLE SPECIES PRESENT, SUGGEST RECOLLECTION   Report Status 08/01/2019 FINAL  Final  Culture, blood (routine x 2)     Status: None   Collection Time: 07/31/19  4:45 PM   Specimen: BLOOD RIGHT HAND  Result Value Ref Range Status   Specimen Description BLOOD RIGHT HAND  Final   Special Requests   Final    BOTTLES DRAWN AEROBIC ONLY Blood Culture adequate volume   Culture   Final    NO GROWTH 5 DAYS Performed at [facility], [place] [address], [place], [place] [address]    Report Status 08/05/2019 FINAL  Final  Culture, blood (routine x 2)     Status: None   Collection Time: 07/31/19  4:45 PM   Specimen: BLOOD RIGHT HAND  Result Value Ref Range Status   Specimen Description BLOOD RIGHT HAND  Final   Special Requests   Final     BOTTLES DRAWN AEROBIC ONLY Blood Culture adequate volume   Culture   Final    NO GROWTH 5 DAYS Performed at [facility], [place] [address], [place], [place] [address]    Report Status 08/05/2019 FINAL  Final  Culture, Urine     Status: None   Collection Time: 08/03/19  6:30 PM   Specimen: Urine, Catheterized  Result Value Ref Range Status   Specimen Description URINE, CATHETERIZED  Final   Special Requests NONE  Final   Culture   Final    NO GROWTH Performed at [place] [address] [place],  [address]    Report Status 08/05/2019 FINAL  Final    Radiology Studies: No results found.  Ishmael Holter, M.D. [facility]  If 7PM-7AM, please contact night-coverage [email or web address] Password [facility] 08/05/2019, 3:04 PM

## 2019-08-06 DIAGNOSIS — Z4789 Encounter for other orthopedic aftercare: Secondary | ICD-10-CM | POA: Diagnosis not present

## 2019-08-06 DIAGNOSIS — S7291XD Unspecified fracture of right femur, subsequent encounter for closed fracture with routine healing: Secondary | ICD-10-CM | POA: Diagnosis not present

## 2019-08-06 DIAGNOSIS — C439 Malignant melanoma of skin, unspecified: Secondary | ICD-10-CM | POA: Diagnosis not present

## 2019-08-06 DIAGNOSIS — C50912 Malignant neoplasm of unspecified site of left female breast: Secondary | ICD-10-CM | POA: Diagnosis not present

## 2019-08-06 DIAGNOSIS — S72491D Other fracture of lower end of right femur, subsequent encounter for closed fracture with routine healing: Secondary | ICD-10-CM | POA: Diagnosis not present

## 2019-08-06 DIAGNOSIS — C50412 Malignant neoplasm of upper-outer quadrant of left female breast: Secondary | ICD-10-CM | POA: Diagnosis not present

## 2019-08-06 DIAGNOSIS — N3281 Overactive bladder: Secondary | ICD-10-CM | POA: Diagnosis not present

## 2019-08-06 DIAGNOSIS — R2681 Unsteadiness on feet: Secondary | ICD-10-CM | POA: Diagnosis not present

## 2019-08-06 DIAGNOSIS — R2689 Other abnormalities of gait and mobility: Secondary | ICD-10-CM | POA: Diagnosis not present

## 2019-08-06 DIAGNOSIS — I959 Hypotension, unspecified: Secondary | ICD-10-CM | POA: Diagnosis not present

## 2019-08-06 DIAGNOSIS — S72341D Displaced spiral fracture of shaft of right femur, subsequent encounter for closed fracture with routine healing: Secondary | ICD-10-CM | POA: Diagnosis not present

## 2019-08-06 DIAGNOSIS — I480 Paroxysmal atrial fibrillation: Secondary | ICD-10-CM | POA: Diagnosis not present

## 2019-08-06 DIAGNOSIS — S72001A Fracture of unspecified part of neck of right femur, initial encounter for closed fracture: Secondary | ICD-10-CM | POA: Diagnosis not present

## 2019-08-06 DIAGNOSIS — Z7401 Bed confinement status: Secondary | ICD-10-CM | POA: Diagnosis not present

## 2019-08-06 DIAGNOSIS — F05 Delirium due to known physiological condition: Secondary | ICD-10-CM | POA: Diagnosis not present

## 2019-08-06 DIAGNOSIS — F331 Major depressive disorder, recurrent, moderate: Secondary | ICD-10-CM | POA: Diagnosis not present

## 2019-08-06 DIAGNOSIS — M2550 Pain in unspecified joint: Secondary | ICD-10-CM | POA: Diagnosis not present

## 2019-08-06 DIAGNOSIS — R410 Disorientation, unspecified: Secondary | ICD-10-CM | POA: Diagnosis not present

## 2019-08-06 DIAGNOSIS — M9701XD Periprosthetic fracture around internal prosthetic right hip joint, subsequent encounter: Secondary | ICD-10-CM | POA: Diagnosis not present

## 2019-08-06 DIAGNOSIS — E039 Hypothyroidism, unspecified: Secondary | ICD-10-CM | POA: Diagnosis not present

## 2019-08-06 DIAGNOSIS — E038 Other specified hypothyroidism: Secondary | ICD-10-CM | POA: Diagnosis not present

## 2019-08-06 DIAGNOSIS — W19XXXD Unspecified fall, subsequent encounter: Secondary | ICD-10-CM | POA: Diagnosis not present

## 2019-08-06 DIAGNOSIS — R278 Other lack of coordination: Secondary | ICD-10-CM | POA: Diagnosis not present

## 2019-08-06 DIAGNOSIS — G4733 Obstructive sleep apnea (adult) (pediatric): Secondary | ICD-10-CM | POA: Diagnosis not present

## 2019-08-06 DIAGNOSIS — G9341 Metabolic encephalopathy: Secondary | ICD-10-CM | POA: Diagnosis not present

## 2019-08-06 DIAGNOSIS — S79929A Unspecified injury of unspecified thigh, initial encounter: Secondary | ICD-10-CM | POA: Diagnosis not present

## 2019-08-06 DIAGNOSIS — I4891 Unspecified atrial fibrillation: Secondary | ICD-10-CM | POA: Diagnosis not present

## 2019-08-06 DIAGNOSIS — G4730 Sleep apnea, unspecified: Secondary | ICD-10-CM | POA: Diagnosis not present

## 2019-08-06 DIAGNOSIS — F322 Major depressive disorder, single episode, severe without psychotic features: Secondary | ICD-10-CM | POA: Diagnosis not present

## 2019-08-06 DIAGNOSIS — F419 Anxiety disorder, unspecified: Secondary | ICD-10-CM | POA: Diagnosis not present

## 2019-08-06 DIAGNOSIS — F329 Major depressive disorder, single episode, unspecified: Secondary | ICD-10-CM | POA: Diagnosis not present

## 2019-08-06 LAB — BASIC METABOLIC PANEL
Anion gap: 11 (ref 5–15)
BUN: 14 mg/dL (ref 8–23)
CO2: 25 mmol/L (ref 22–32)
Calcium: 8.7 mg/dL — ABNORMAL LOW (ref 8.9–10.3)
Chloride: 102 mmol/L (ref 98–111)
Creatinine, Ser: 0.65 mg/dL (ref 0.44–1.00)
GFR calc Af Amer: >60 mL/min (ref >60)
GFR calc non Af Amer: >60 mL/min (ref >60)
Glucose, Bld: 107 mg/dL — ABNORMAL HIGH (ref 70–99)
Potassium: 3.5 mmol/L (ref 3.5–5.1)
Sodium: 138 mmol/L (ref 135–145)

## 2019-08-06 LAB — CBC WITH DIFFERENTIAL/PLATELET
Abs Immature Granulocytes: 0.09 10*3/uL — ABNORMAL HIGH (ref 0.00–0.07)
Basophils Absolute: 0.1 10*3/uL (ref 0.0–0.1)
Basophils Relative: 1 %
Eosinophils Absolute: 0.2 10*3/uL (ref 0.0–0.5)
Eosinophils Relative: 2 %
HCT: 26.4 % — ABNORMAL LOW (ref 36.0–46.0)
Hemoglobin: 8.9 g/dL — ABNORMAL LOW (ref 12.0–15.0)
Immature Granulocytes: 1 %
Lymphocytes Relative: 14 %
Lymphs Abs: 1.3 10*3/uL (ref 0.7–4.0)
MCH: 31.6 pg (ref 26.0–34.0)
MCHC: 33.7 g/dL (ref 30.0–36.0)
MCV: 93.6 fL (ref 80.0–100.0)
Monocytes Absolute: 0.7 10*3/uL (ref 0.1–1.0)
Monocytes Relative: 8 %
Neutro Abs: 6.8 10*3/uL (ref 1.7–7.7)
Neutrophils Relative %: 74 %
Platelets: 307 10*3/uL (ref 150–400)
RBC: 2.82 MIL/uL — ABNORMAL LOW (ref 3.87–5.11)
RDW: 18.4 % — ABNORMAL HIGH (ref 11.5–15.5)
WBC: 9.1 10*3/uL (ref 4.0–10.5)
nRBC: 0 % (ref 0.0–0.2)

## 2019-08-06 LAB — GLUCOSE, CAPILLARY
Glucose-Capillary: 111 mg/dL — ABNORMAL HIGH (ref 70–99)
Glucose-Capillary: 130 mg/dL — ABNORMAL HIGH (ref 70–99)

## 2019-08-06 NOTE — Progress Notes (Signed)
[facility] arrived for transport to [facility]. Daughter at bedside.

## 2019-08-06 NOTE — Discharge Summary (Signed)
Physician Discharge Summary  Andrea [name] [id number] DOB: September 03, 1935 DOA: 07/25/2019  PCP: Gayland Curry, DO  Admit date: 07/25/2019 Discharge date: 08/06/2019  Admitted From: Home Disposition: SNF  Recommendations for Outpatient Follow-up:  1. Follow up with PCP in 1-2 weeks 2. Follow-up with orthopedics in 1 to 2 weeks 3. Please obtain BMP/CBC in one week 4. Please follow up on the following pending results:  Home Health: None Equipment/Devices: None  Discharge Condition: Stable CODE STATUS: DNR Diet recommendation: Cardiac  Subjective: Patient seen and examined.  No family member present bedside.  She had no complaint.  She was alert and oriented x3, at her baseline.  Brief/Interim Summary: 84 y.o.femalewith history ofA. fib on Eliquis, remote bilateral TKA, left breast cancer status post lumpectomy and radiation now on chemo, OSA on CPAP, hypothyroidism, overactive bladder, depression/anxiety and DM-2 presented to ED on 07/25/2019 with mechanical fall at homeand was diagnosed with acute displaced spiral fracture of the distal femoral metadiaphysis.  She was subsequently admitted to hospital service and orthopedics were consulted.  She underwent open reduction internal fixation of right femoral with application of wound VAC on 07/26/2019 by Dr. Lyla Glassing.Postop course complicated by acute encephalopathy/drowsiness without focal neuro deficit.Work-up including CT head and urinalysis were negative.  She she was on low-dose Remeron but not taking pain medications.   Patient had abdominal pain, fever to 100.3 (geriatric) and leukocytosis to 14.5 with bandemia on 9/10.  She continued to look drowsy and weak.  Obtained UA, urine culture, chest x-ray and CT abdomen and pelvis and all of them are unremarkable however despite of that, she was treated with 5-day of IV antibiotics.  Her encephalopathy improved.  She is currently alert and oriented x3, likely at her baseline.  Her A.  fib is controlled.  She has been started on Eliquis.  She was seen by PT OT and they recommended SNF which has been arranged for her so she is going to be discharged in stable condition.  She is going to SNF with wound VAC.  Wound VAC orders per orthopedic.  Discharge Diagnoses:  Active Problems:   Delirium due to general medical condition   Right femoral fracture [facility])    Discharge Instructions  Discharge Instructions    Call MD for:  persistant nausea and vomiting   Complete by: As directed    Call MD for:  redness, tenderness, or signs of infection (pain, swelling, redness, odor or green/yellow discharge around incision site)   Complete by: As directed    Call MD for:  severe uncontrolled pain   Complete by: As directed    Call MD for:  temperature >100.4   Complete by: As directed    Diet general   Complete by: As directed    Discharge patient   Complete by: As directed    Discharge disposition: 03-Skilled [place]   Discharge patient date: 08/06/2019   Increase activity slowly   Complete by: As directed      Allergies as of 08/06/2019      Reactions   Clindamycin Hcl Other (See Comments)   REACTION: swelling, pt. Reports that she had a 16 lb. weightgain in one day      Medication List    STOP taking these medications   furosemide 40 MG tablet Commonly known as: LASIX   oxyCODONE-acetaminophen 10-325 MG tablet Commonly known as: PERCOCET     TAKE these medications   anastrozole 1 MG tablet Commonly known as: ARIMIDEX Take 1 tablet (  1 mg total) by mouth daily.   brexpiprazole 1 MG Tabs tablet Commonly known as: REXULTI Take 1 mg by mouth at bedtime.   busPIRone 5 MG tablet Commonly known as: BUSPAR Take 5-10 mg by mouth 2 (two) times daily. 10 MG IN THE MORNING, 5 MG AT DINNER   CALCIUM CITRATE PO Take 600 mg by mouth daily.   clonazePAM 0.5 MG tablet Commonly known as: KLONOPIN Take 1 tablet by mouth as needed in middle of the nite to go back  to sleep   CRANBERRY PO Take 2 tablets by mouth daily.   DULoxetine 60 MG capsule Commonly known as: CYMBALTA Take 1 capsule (60 mg total) by mouth daily. What changed: how much to take   Eliquis 5 MG Tabs tablet Generic drug: apixaban TAKE 1 TABLET BY MOUTH TWICE A DAY What changed: how much to take   levothyroxine 50 MCG tablet Commonly known as: SYNTHROID TAKE ONE TABLET BY MOUTH ONCE DAILY 30 MINUTES BEFORE BREAKFAST FOR THYROID What changed: See the new instructions.   lidocaine 4 % cream Commonly known as: LMX Apply 1 application topically as needed (port).   mirtazapine 15 MG tablet Commonly known as: REMERON Take 7.5 mg by mouth at bedtime.   MULTIVITAMIN PO Take 1 tablet by mouth daily.   nicotine 7 mg/24hr patch Commonly known as: NICODERM CQ - dosed in mg/24 hr Place 1 patch (7 mg total) onto the skin daily.   oxybutynin 10 MG 24 hr tablet Commonly known as: DITROPAN-XL Take 10 mg by mouth daily.   oxyCODONE 5 MG immediate release tablet Commonly known as: Oxy IR/ROXICODONE Take 1 tablet (5 mg total) by mouth every 4 (four) hours as needed for moderate pain.   senna-docusate 8.6-50 MG tablet Commonly known as: Senokot-S Take 1 tablet by mouth 2 (two) times daily as needed for mild constipation.   traZODone 100 MG tablet Commonly known as: DESYREL Take 1 tablet (100 mg total) by mouth at bedtime. Take 2 tablets by mouth at bedtime What changed: how much to take   vitamin B-12 500 MCG tablet Commonly known as: CYANOCOBALAMIN Take 500 mcg by mouth daily.      Follow-up Information    Swinteck, Aaron Edelman, MD. Schedule an appointment as soon as possible for a visit on 08/08/2019.   Specialty: Orthopedic Surgery Why: [facility] removal Contact information: [address] [place] [address] [id number]        Hollace Kinnier L, DO. Schedule an appointment as soon as possible for a visit in 1 week(s).   Specialty: Geriatric  Medicine Contact information: [place]. [place] [address] [phone]          Allergies  Allergen Reactions  . Clindamycin Hcl Other (See Comments)    REACTION: swelling, pt. Reports that she had a 16 lb. weightgain in one day    Consultations: Orthopedics   Procedures/Studies: Dg Chest 1 View  Result Date: 07/25/2019 CLINICAL DATA:  Right femur fracture.  Preoperative study. EXAM: CHEST  1 VIEW COMPARISON:  Chest x-ray dated Apr 01, 2019. FINDINGS: Right chest wall port catheter with tip at the cavoatrial junction. The heart size and mediastinal contours are within normal limits. Atherosclerotic calcification of the aortic arch. Normal pulmonary vascularity. No focal consolidation, pleural effusion, or pneumothorax. No acute osseous abnormality. IMPRESSION: No active disease. Electronically Signed   By: Titus Dubin M.D.   On: 07/25/2019 09:36   Dg Knee 1-2 Views Right  Result Date:  07/25/2019 CLINICAL DATA:  Fall. EXAM: RIGHT KNEE - 1-2 VIEW; RIGHT FEMUR 2 VIEWS COMPARISON:  Right knee x-rays dated April 24, 2013. FINDINGS: Prior right total knee arthroplasty. No evidence of hardware failure or loosening. Acute spiral fracture of the distal femoral metadiaphysis with 1 cm medial displacement and up to 3 cm anterior displacement. The fracture does not involve the femoral component. No dislocation. No joint effusion. Soft tissues are unremarkable. IMPRESSION: 1. Acute displaced spiral fracture of the distal femoral metadiaphysis. Electronically Signed   By: Titus Dubin M.D.   On: 07/25/2019 09:33   Ct Head Wo Contrast  Result Date: 07/29/2019 CLINICAL DATA:  Encephalopathy EXAM: CT HEAD WITHOUT CONTRAST TECHNIQUE: Contiguous axial images were obtained from the base of the skull through the vertex without intravenous contrast. COMPARISON:  Head CT 07/25/2019 FINDINGS: Brain: There is no acute intracranial hemorrhage or acute demarcated cortical infarction. No evidence of  intracranial mass. No midline shift or extra-axial fluid collection. Advanced hypoattenuation within the cerebral white matter is similar to prior examination and nonspecific, but consistent with chronic small vessel ischemic disease. Mild generalized parenchymal atrophy. Vascular: No hyperdense vessel. Atherosclerotic calcification of the carotid artery siphons. Skull: No calvarial fracture or suspicious osseous lesion. Sinuses/Orbits: Imaged orbits demonstrate no acute abnormality. No significant paranasal sinus disease or mastoid effusion at the imaged levels. IMPRESSION: No evidence of acute intracranial abnormality. Mild generalized parenchymal atrophy with advanced chronic small vessel ischemic disease. Electronically Signed   By: Kellie Simmering   On: 07/29/2019 19:33   Ct Head Wo Contrast  Result Date: 07/25/2019 CLINICAL DATA:  Fall on blood thinners. History of breast cancer. EXAM: CT HEAD WITHOUT CONTRAST TECHNIQUE: Contiguous axial images were obtained from the base of the skull through the vertex without intravenous contrast. COMPARISON:  11/23/2013 FINDINGS: Brain: There is no evidence of acute infarct, intracranial hemorrhage, mass, midline shift, or extra-axial fluid collection. Confluent hypodensities in the cerebral white matter bilaterally are stable to slightly increased and nonspecific but compatible with extensive chronic small vessel ischemic disease. Mild cerebral atrophy is not considered abnormal for age. Vascular: Calcified atherosclerosis at the skull base. No hyperdense vessel. Skull: No fracture focal osseous lesion. Sinuses/Orbits: Visualized paranasal sinuses and mastoid air cells are clear. Bilateral cataract extraction is noted. Other: None. IMPRESSION: 1. No evidence of acute intracranial abnormality. 2. Extensive chronic small vessel ischemic disease. Electronically Signed   By: Logan Bores M.D.   On: 07/25/2019 08:48   Ct Abdomen Pelvis W Contrast  Result Date:  08/01/2019 CLINICAL DATA:  Abdominal pain, fever EXAM: CT ABDOMEN AND PELVIS WITH CONTRAST TECHNIQUE: Multidetector CT imaging of the abdomen and pelvis was performed using the standard protocol following bolus administration of intravenous contrast. CONTRAST:  127mL OMNIPAQUE IOHEXOL 300 MG/ML  SOLN COMPARISON:  None. FINDINGS: Lower chest: No confluent opacities.  Heart is normal size. Hepatobiliary: Hepatic cysts. No suspicious focal hepatic abnormality or biliary ductal dilatation. Gallbladder unremarkable. Pancreas: No focal abnormality or ductal dilatation. Spleen: No focal abnormality.  Normal size. Adrenals/Urinary Tract: No adrenal abnormality. No focal renal abnormality. No stones or hydronephrosis. Urinary bladder is unremarkable. Stomach/Bowel: Stomach, large and small bowel grossly unremarkable. Vascular/Lymphatic: Aortic atherosclerosis. No enlarged abdominal or pelvic lymph nodes. Reproductive: Uterus and adnexa unremarkable.  No mass. Other: No free fluid or free air. Musculoskeletal: No acute bony abnormality. IMPRESSION: No acute findings in the abdomen or pelvis. Aortic atherosclerosis. Hepatic cysts. Electronically Signed   By: Rolm Baptise M.D.   On: 08/01/2019  00:59   Dg Chest Port 1 View  Result Date: 07/31/2019 CLINICAL DATA:  Fever. EXAM: PORTABLE CHEST 1 VIEW COMPARISON:  Radiograph of July 25, 2019. FINDINGS: The heart size and mediastinal contours are within normal limits. No pneumothorax or pleural effusion is noted. Right internal jugular Port-A-Cath is unchanged in position. Left lung is clear. Minimal right basilar subsegmental atelectasis or scarring is noted. The visualized skeletal structures are unremarkable. IMPRESSION: Minimal right basilar subsegmental atelectasis or scarring. Electronically Signed   By: Marijo Conception M.D.   On: 07/31/2019 13:49   Dg C-arm 1-60 Min  Result Date: 07/26/2019 CLINICAL DATA:  Right femoral fracture fixation. EXAM: DG C-ARM 1-60 MIN  CONTRAST:  None. FLUOROSCOPY TIME:  Fluoroscopy Time:  2 minutes 20 seconds COMPARISON:  July 26, 2019 FINDINGS: Intraoperative images from sideplate and screw fixation of distal femoral fracture demonstrate placement of hardware with improved alignment at the fracture line. Expected soft tissue swelling. IMPRESSION: Intraoperative images from sideplate and screw fixation of distal femoral fracture with improved alignment at the fracture line. Electronically Signed   By: Fidela Salisbury M.D.   On: 07/26/2019 13:00   Dg Hip Unilat W Or Wo Pelvis 2-3 Views Right  Result Date: 07/25/2019 CLINICAL DATA:  Fall. EXAM: DG HIP (WITH OR WITHOUT PELVIS) 2-3V RIGHT COMPARISON:  Right hip x-rays dated February 24, 2013. FINDINGS: There is no evidence of hip fracture or dislocation. There is no evidence of arthropathy or other focal bone abnormality. Osteopenia. Degenerative changes of the lumbar spine and sacroiliac joints. Soft tissues are unremarkable. IMPRESSION: No acute osseous abnormality. Electronically Signed   By: Titus Dubin M.D.   On: 07/25/2019 09:35   Dg Femur, Min 2 Views Right  Result Date: 07/26/2019 CLINICAL DATA:  Right femur fixation. EXAM: RIGHT FEMUR 2 VIEWS COMPARISON:  July 25, 2019 FINDINGS: Intraoperative fluoroscopic images from sideplate and screw fixation of distal femoral fracture demonstrate placement of hardware and improved alignment at the fracture line. Intact right knee arthroplasty is noted. Fluoroscopy time is recorded as 2 minutes 20 seconds. IMPRESSION: Intraoperative fluoroscopic images from sideplate and screw fixation of distal femoral fracture. Electronically Signed   By: Fidela Salisbury M.D.   On: 07/26/2019 12:58   Dg Femur Min 2 Views Right  Result Date: 07/25/2019 CLINICAL DATA:  Fall. EXAM: RIGHT KNEE - 1-2 VIEW; RIGHT FEMUR 2 VIEWS COMPARISON:  Right knee x-rays dated April 24, 2013. FINDINGS: Prior right total knee arthroplasty. No evidence of hardware  failure or loosening. Acute spiral fracture of the distal femoral metadiaphysis with 1 cm medial displacement and up to 3 cm anterior displacement. The fracture does not involve the femoral component. No dislocation. No joint effusion. Soft tissues are unremarkable. IMPRESSION: 1. Acute displaced spiral fracture of the distal femoral metadiaphysis. Electronically Signed   By: Titus Dubin M.D.   On: 07/25/2019 09:33   Dg Femur Port, Min 2 Views Right  Result Date: 07/26/2019 CLINICAL DATA:  Status post ORIF of femur fracture. EXAM: RIGHT FEMUR PORTABLE 2 VIEW COMPARISON:  07/25/2019. FINDINGS: Interval open reduction and internal fixation of distal femur fracture. Screw and plate device with cerclage wires are identified and appear to be in anatomic alignment. No complicating features identified. Previous right knee arthroplasty. IMPRESSION: 1. Status post ORIF of distal femur fracture. Electronically Signed   By: Kerby Moors M.D.   On: 07/26/2019 13:14      Discharge Exam: Vitals:   08/06/19 0452 08/06/19 0734  BP: [name])  137/57 (!) 132/59  Pulse: 68 67  Resp:  14  Temp: 98.4 F (36.9 C) 98 F (36.7 C)  SpO2: 99% 100%   Vitals:   08/05/19 1959 08/06/19 0452 08/06/19 0500 08/06/19 0734  BP: (!) 117/50 (!) 137/57  (!) 132/59  Pulse: 89 68  67  Resp: 20   14  Temp: 98.9 F (37.2 C) 98.4 F (36.9 C)  98 F (36.7 C)  TempSrc: Oral Oral  Oral  SpO2: 98% 99%  100%  Weight:   97.6 kg   Height:        General: Pt is alert, awake, not in acute distress Cardiovascular: RRR, S1/S2 +, no rubs, no gallops Respiratory: CTA bilaterally, no wheezing, no rhonchi Abdominal: Soft, NT, ND, bowel sounds + Extremities: no edema, no cyanosis.  Wound VAC attached to the right hip area.    The results of significant diagnostics from this hospitalization (including imaging, microbiology, ancillary and laboratory) are listed below for reference.     Microbiology: Recent Results (from the past  240 hour(s))  SARS CORONAVIRUS 2 (TAT 6-24 HRS)     Status: None   Collection Time: 07/31/19  5:24 AM  Result Value Ref Range Status   SARS Coronavirus 2 NEGATIVE NEGATIVE Final    Comment: (NOTE) SARS-CoV-2 target nucleic acids are NOT DETECTED. The SARS-CoV-2 RNA is generally detectable in upper and lower respiratory specimens during the acute phase of infection. Negative results do not preclude SARS-CoV-2 infection, do not rule out co-infections with other pathogens, and should not be used as the sole basis for treatment or other patient management decisions. Negative results must be combined with clinical observations, patient history, and epidemiological information. The expected result is Negative. Fact Sheet for Patients: SugarRoll.be Fact Sheet for Healthcare Providers: https://www.woods-mathews.com/ This test is not yet approved or cleared by the [place] FDA and  has been authorized for detection and/or diagnosis of SARS-CoV-2 by FDA under an Emergency Use Authorization (EUA). This EUA will remain  in effect (meaning this test can be used) for the duration of the COVID-19 declaration under Section 56 4(b)(1) of the Act, 21 U.S.C. section 360bbb-3(b)(1), unless the authorization is terminated or revoked sooner. Performed at [facility], [place] [address], [place], [place] [address]   Culture, Urine     Status: Abnormal   Collection Time: 07/31/19 12:00 PM   Specimen: Urine, Random  Result Value Ref Range Status   Specimen Description URINE, RANDOM  Final   Special Requests   Final    NONE Performed at [facility], [place] [address] [place],  [address]    Culture (A)  Final    80,000 COLONIES/mL MULTIPLE SPECIES PRESENT, SUGGEST RECOLLECTION   Report Status 08/01/2019 FINAL  Final  Culture, blood (routine x 2)     Status: None   Collection Time: 07/31/19  4:45 PM   Specimen: BLOOD RIGHT HAND  Result  Value Ref Range Status   Specimen Description BLOOD RIGHT HAND  Final   Special Requests   Final    BOTTLES DRAWN AEROBIC ONLY Blood Culture adequate volume   Culture   Final    NO GROWTH 5 DAYS Performed at [facility], [place] [address] [place],  [address]    Report Status 08/05/2019 FINAL  Final  Culture, blood (routine x 2)     Status: None   Collection Time: 07/31/19  4:45 PM   Specimen: BLOOD RIGHT HAND  Result Value  Ref Range Status   Specimen Description BLOOD RIGHT HAND  Final   Special Requests   Final    BOTTLES DRAWN AEROBIC ONLY Blood Culture adequate volume   Culture   Final    NO GROWTH 5 DAYS Performed at [facility], [address] [place], [place] [address]    Report Status 08/05/2019 FINAL  Final  Culture, Urine     Status: None   Collection Time: 08/03/19  6:30 PM   Specimen: Urine, Catheterized  Result Value Ref Range Status   Specimen Description URINE, CATHETERIZED  Final   Special Requests NONE  Final   Culture   Final    NO GROWTH Performed at [place] [address] [place], [place] [address]    Report Status 08/05/2019 FINAL  Final     Labs: BNP (last 3 results) No results for input(s): BNP in the last 8760 hours. Basic Metabolic Panel: Recent Labs  Lab 07/31/19 0425 08/01/19 0630 08/02/19 0537 08/03/19 0441 08/05/19 1144 08/06/19 0334  NA 132* 135 134* 133* 135 138  K 3.8 3.7 3.5 4.1 3.6 3.5  CL 99 102 103 102 101 102  CO2 23 24 22  21* 23 25  GLUCOSE 125* 95 111* 114* 109* 107*  BUN 24* 16 24* 21 13 14   CREATININE 0.71 0.72 0.71 0.64 0.64 0.65  CALCIUM 8.2* 8.2* 8.1* 8.3* 8.6* 8.7*  MG 1.9 1.9 1.7 1.8 1.7  --   PHOS 2.5  --   --  2.7  --   --    Liver Function Tests: Recent Labs  Lab 07/31/19 0425 08/05/19 1144  AST 24 22  ALT 26 18  ALKPHOS 56 57  BILITOT 0.8 0.9  PROT 5.4* 5.2*  ALBUMIN 2.8* 2.6*   No results for input(s): LIPASE, AMYLASE in the last 168 hours. No results for input(s):  AMMONIA in the last 168 hours. CBC: Recent Labs  Lab 07/31/19 0905  08/02/19 0941 08/02/19 2134 08/03/19 0441 08/04/19 0407 08/05/19 1144 08/06/19 0334  WBC 14.5*   < > 11.8*  --  12.9* 8.7 9.2 9.1  NEUTROABS 11.9*  --   --   --   --  6.3 7.6 6.8  HGB 8.6*   < > 7.1* 9.4* 9.1* 8.7* 8.8* 8.9*  HCT 26.2*   < > 21.2* 28.9* 27.3* 26.4* 25.8* 26.4*  MCV 88.8   < > 89.8  --  89.2 92.6 91.5 93.6  PLT 264   < > 317  --  316 303 313 307   < > = values in this interval not displayed.   Cardiac Enzymes: No results for input(s): CKTOTAL, CKMB, CKMBINDEX, TROPONINI in the last 168 hours. BNP: Invalid input(s): POCBNP CBG: Recent Labs  Lab 08/05/19 0627 08/05/19 1136 08/05/19 1550 08/05/19 2131 08/06/19 0647  GLUCAP 102* 105* 117* 153* 111*   D-Dimer No results for input(s): DDIMER in the last 72 hours. Hgb A1c No results for input(s): HGBA1C in the last 72 hours. Lipid Profile No results for input(s): CHOL, HDL, LDLCALC, TRIG, CHOLHDL, LDLDIRECT in the last 72 hours. Thyroid function studies No results for input(s): TSH, T4TOTAL, T3FREE, THYROIDAB in the last 72 hours.  Invalid input(s): FREET3 Anemia work up No results for input(s): VITAMINB12, FOLATE, FERRITIN, TIBC, IRON, RETICCTPCT in the last 72 hours. Urinalysis    Component Value Date/Time   COLORURINE YELLOW 08/03/2019 1838   APPEARANCEUR CLEAR 08/03/2019 1838   APPEARANCEUR Cloudy (A) 09/22/2014 1015  LABSPEC 1.011 08/03/2019 1838   PHURINE 6.0 08/03/2019 1838   GLUCOSEU NEGATIVE 08/03/2019 1838   HGBUR SMALL (A) 08/03/2019 1838   BILIRUBINUR NEGATIVE 08/03/2019 1838   BILIRUBINUR neg 09/06/2018 1110   BILIRUBINUR Negative 09/22/2014 1015   KETONESUR 5 (A) 08/03/2019 1838   PROTEINUR NEGATIVE 08/03/2019 1838   UROBILINOGEN 0.2 09/06/2018 1110   UROBILINOGEN 0.2 03/11/2013 2003   NITRITE NEGATIVE 08/03/2019 1838   LEUKOCYTESUR NEGATIVE 08/03/2019 1838   Sepsis Labs Invalid input(s): PROCALCITONIN,  WBC,   LACTICIDVEN Microbiology Recent Results (from the past 240 hour(s))  SARS CORONAVIRUS 2 (TAT 6-24 HRS)     Status: None   Collection Time: 07/31/19  5:24 AM  Result Value Ref Range Status   SARS Coronavirus 2 NEGATIVE NEGATIVE Final    Comment: (NOTE) SARS-CoV-2 target nucleic acids are NOT DETECTED. The SARS-CoV-2 RNA is generally detectable in upper and lower respiratory specimens during the acute phase of infection. Negative results do not preclude SARS-CoV-2 infection, do not rule out co-infections with other pathogens, and should not be used as the sole basis for treatment or other patient management decisions. Negative results must be combined with clinical observations, patient history, and epidemiological information. The expected result is Negative. Fact Sheet for Patients: SugarRoll.be Fact Sheet for Healthcare Providers: https://www.woods-mathews.com/ This test is not yet approved or cleared by the [place] FDA and  has been authorized for detection and/or diagnosis of SARS-CoV-2 by FDA under an Emergency Use Authorization (EUA). This EUA will remain  in effect (meaning this test can be used) for the duration of the COVID-19 declaration under Section 56 4(b)(1) of the Act, 21 U.S.C. section 360bbb-3(b)(1), unless the authorization is terminated or revoked sooner. Performed at [facility], [place] [address] [place], [place] [address]   Culture, Urine     Status: Abnormal   Collection Time: 07/31/19 12:00 PM   Specimen: Urine, Random  Result Value Ref Range Status   Specimen Description URINE, RANDOM  Final   Special Requests   Final    NONE Performed at [facility], [place] [address] [place], [place] [address]    Culture (A)  Final    80,000 COLONIES/mL MULTIPLE SPECIES PRESENT, SUGGEST RECOLLECTION   Report Status 08/01/2019 FINAL  Final  Culture, blood (routine x 2)     Status: None   Collection Time:  07/31/19  4:45 PM   Specimen: BLOOD RIGHT HAND  Result Value Ref Range Status   Specimen Description BLOOD RIGHT HAND  Final   Special Requests   Final    BOTTLES DRAWN AEROBIC ONLY Blood Culture adequate volume   Culture   Final    NO GROWTH 5 DAYS Performed at [facility], [place] [address], [place], [place] [address]    Report Status 08/05/2019 FINAL  Final  Culture, blood (routine x 2)     Status: None   Collection Time: 07/31/19  4:45 PM   Specimen: BLOOD RIGHT HAND  Result Value Ref Range Status   Specimen Description BLOOD RIGHT HAND  Final   Special Requests   Final    BOTTLES DRAWN AEROBIC ONLY Blood Culture adequate volume   Culture   Final    NO GROWTH 5 DAYS Performed at [facility], [place] [address] [place], [place] [address]    Report Status 08/05/2019 FINAL  Final  Culture, Urine     Status: None   Collection Time: 08/03/19  6:30 PM  Specimen: Urine, Catheterized  Result Value Ref Range Status   Specimen Description URINE, CATHETERIZED  Final   Special Requests NONE  Final   Culture   Final    NO GROWTH Performed at [facility], [address], [place], [place] [address]    Report Status 08/05/2019 FINAL  Final     Time coordinating discharge: Over 30 minutes  SIGNED:   Darliss Cheney, MD  [facility] 08/06/2019, 9:06 AM Pager [phone]  If 7PM-7AM, please contact night-coverage [email or web address] Password [email or web address]

## 2019-08-06 NOTE — Progress Notes (Signed)
Pt transported o 5c d/c unit until PTAR pick up at 1330. Pt was in stable condition. Report given to [place] at [place].

## 2019-08-06 NOTE — TOC Transition Note (Addendum)
Transition of Care [facility]) - CM/SW Discharge Note   Patient Details  Name: Andrea [name] MRN: [id number] Date of Birth: 04-Jun-1935  Transition of Care [facility]) CM/SW Contact:  Midge Minium RN, BSN, NCM-BC, ACM-RN [phone] Phone Number: 08/06/2019, 11:26 AM   Clinical Narrative:    Patient medically stable to transition to [facility] today. CM updated [name] (SNF Admissions Coordinator); patient will be in room # [phone]; report can be called to: [phone]. PTAR arranged for 1330; CM updated the primary care nurse. SNF packet placed in the in basket hub for the admissions coordinator. Patients caregiver, [name], made aware of DC today to SNF and stated she will provide the patients home cpap to the facility. No further needs from CM.     Final next level of care: Skilled Nursing Facility Barriers to Discharge: No Barriers Identified   Patient Goals and CMS Choice   CMS Medicare.gov Compare Post Acute Care list provided to:: Patient Represenative (must comment)(SNF list left with caregiver [name] to be given to daughter [name]) Choice offered to / list presented to : Adult Children  Discharge Placement              Patient chooses bed at: [place] Patient to be transferred to facility by: PTAR @ 1330 Name of family member notified: Patient's caregiver, [name] (verbal permission was given by patients daughter [name]) Patient and family notified of of transfer: 08/06/19  Discharge Plan and Services In-house Referral: Clinical Social Work   Post Acute Care Choice: [place]             Social Determinants of Health (SDOH) Interventions     Readmission Risk Interventions No flowsheet data found.

## 2019-08-06 NOTE — Progress Notes (Signed)
Prevenia device in place.

## 2019-08-07 DIAGNOSIS — I480 Paroxysmal atrial fibrillation: Secondary | ICD-10-CM | POA: Diagnosis not present

## 2019-08-07 DIAGNOSIS — S72491D Other fracture of lower end of right femur, subsequent encounter for closed fracture with routine healing: Secondary | ICD-10-CM | POA: Diagnosis not present

## 2019-08-07 DIAGNOSIS — C50912 Malignant neoplasm of unspecified site of left female breast: Secondary | ICD-10-CM | POA: Diagnosis not present

## 2019-08-07 DIAGNOSIS — E038 Other specified hypothyroidism: Secondary | ICD-10-CM | POA: Diagnosis not present

## 2019-08-08 DIAGNOSIS — C439 Malignant melanoma of skin, unspecified: Secondary | ICD-10-CM | POA: Diagnosis not present

## 2019-08-08 DIAGNOSIS — S72001A Fracture of unspecified part of neck of right femur, initial encounter for closed fracture: Secondary | ICD-10-CM | POA: Diagnosis not present

## 2019-08-08 DIAGNOSIS — M978XXA Periprosthetic fracture around other internal prosthetic joint, initial encounter: Secondary | ICD-10-CM | POA: Insufficient documentation

## 2019-08-08 DIAGNOSIS — F322 Major depressive disorder, single episode, severe without psychotic features: Secondary | ICD-10-CM | POA: Diagnosis not present

## 2019-08-08 DIAGNOSIS — G9341 Metabolic encephalopathy: Secondary | ICD-10-CM | POA: Diagnosis not present

## 2019-08-08 DIAGNOSIS — E039 Hypothyroidism, unspecified: Secondary | ICD-10-CM | POA: Diagnosis not present

## 2019-08-08 DIAGNOSIS — G4730 Sleep apnea, unspecified: Secondary | ICD-10-CM | POA: Diagnosis not present

## 2019-08-08 DIAGNOSIS — I4891 Unspecified atrial fibrillation: Secondary | ICD-10-CM | POA: Diagnosis not present

## 2019-08-12 DIAGNOSIS — M9701XD Periprosthetic fracture around internal prosthetic right hip joint, subsequent encounter: Secondary | ICD-10-CM | POA: Diagnosis not present

## 2019-08-14 DIAGNOSIS — I480 Paroxysmal atrial fibrillation: Secondary | ICD-10-CM | POA: Diagnosis not present

## 2019-08-14 DIAGNOSIS — E038 Other specified hypothyroidism: Secondary | ICD-10-CM | POA: Diagnosis not present

## 2019-08-14 DIAGNOSIS — S72491D Other fracture of lower end of right femur, subsequent encounter for closed fracture with routine healing: Secondary | ICD-10-CM | POA: Diagnosis not present

## 2019-08-14 DIAGNOSIS — C50912 Malignant neoplasm of unspecified site of left female breast: Secondary | ICD-10-CM | POA: Diagnosis not present

## 2019-08-17 DIAGNOSIS — N3281 Overactive bladder: Secondary | ICD-10-CM | POA: Diagnosis not present

## 2019-08-17 DIAGNOSIS — S72491D Other fracture of lower end of right femur, subsequent encounter for closed fracture with routine healing: Secondary | ICD-10-CM | POA: Diagnosis not present

## 2019-08-17 DIAGNOSIS — W19XXXD Unspecified fall, subsequent encounter: Secondary | ICD-10-CM | POA: Diagnosis not present

## 2019-08-17 DIAGNOSIS — I480 Paroxysmal atrial fibrillation: Secondary | ICD-10-CM | POA: Diagnosis not present

## 2019-08-17 DIAGNOSIS — G4733 Obstructive sleep apnea (adult) (pediatric): Secondary | ICD-10-CM | POA: Diagnosis not present

## 2019-08-17 DIAGNOSIS — G9341 Metabolic encephalopathy: Secondary | ICD-10-CM | POA: Diagnosis not present

## 2019-08-17 DIAGNOSIS — F419 Anxiety disorder, unspecified: Secondary | ICD-10-CM | POA: Diagnosis not present

## 2019-08-17 DIAGNOSIS — F329 Major depressive disorder, single episode, unspecified: Secondary | ICD-10-CM | POA: Diagnosis not present

## 2019-08-17 DIAGNOSIS — C50912 Malignant neoplasm of unspecified site of left female breast: Secondary | ICD-10-CM | POA: Diagnosis not present

## 2019-08-18 ENCOUNTER — Telehealth: Payer: Self-pay

## 2019-08-18 NOTE — Telephone Encounter (Signed)
RN spoke with patient's daughter, Mortimer Fries.  Pt with recent fracture to femur.  Per daughter, surgeon encouraging patient to hold on therapy scheduled for 08/22/2019.  Pt is currently non weight bearing.  RN cancelled upcoming appointments for maintenance Herceptin.  Pt's daughter will call back after follow up with surgeon in 3 weeks.

## 2019-08-19 ENCOUNTER — Other Ambulatory Visit: Payer: Self-pay

## 2019-08-19 ENCOUNTER — Ambulatory Visit (INDEPENDENT_AMBULATORY_CARE_PROVIDER_SITE_OTHER): Payer: Medicare Other | Admitting: Family

## 2019-08-19 ENCOUNTER — Encounter: Payer: Self-pay | Admitting: Family

## 2019-08-19 DIAGNOSIS — R2681 Unsteadiness on feet: Secondary | ICD-10-CM | POA: Diagnosis not present

## 2019-08-19 DIAGNOSIS — W19XXXD Unspecified fall, subsequent encounter: Secondary | ICD-10-CM | POA: Diagnosis not present

## 2019-08-19 DIAGNOSIS — R399 Unspecified symptoms and signs involving the genitourinary system: Secondary | ICD-10-CM | POA: Diagnosis not present

## 2019-08-19 DIAGNOSIS — Z8781 Personal history of (healed) traumatic fracture: Secondary | ICD-10-CM | POA: Diagnosis not present

## 2019-08-19 DIAGNOSIS — R410 Disorientation, unspecified: Secondary | ICD-10-CM

## 2019-08-19 DIAGNOSIS — E119 Type 2 diabetes mellitus without complications: Secondary | ICD-10-CM | POA: Diagnosis not present

## 2019-08-19 DIAGNOSIS — M25551 Pain in right hip: Secondary | ICD-10-CM | POA: Diagnosis not present

## 2019-08-19 DIAGNOSIS — F419 Anxiety disorder, unspecified: Secondary | ICD-10-CM

## 2019-08-19 DIAGNOSIS — Z9989 Dependence on other enabling machines and devices: Secondary | ICD-10-CM | POA: Diagnosis not present

## 2019-08-19 DIAGNOSIS — I4891 Unspecified atrial fibrillation: Secondary | ICD-10-CM | POA: Diagnosis not present

## 2019-08-19 DIAGNOSIS — F339 Major depressive disorder, recurrent, unspecified: Secondary | ICD-10-CM

## 2019-08-19 DIAGNOSIS — G4733 Obstructive sleep apnea (adult) (pediatric): Secondary | ICD-10-CM | POA: Diagnosis not present

## 2019-08-19 DIAGNOSIS — S7291XD Unspecified fracture of right femur, subsequent encounter for closed fracture with routine healing: Secondary | ICD-10-CM | POA: Diagnosis not present

## 2019-08-19 DIAGNOSIS — Z96653 Presence of artificial knee joint, bilateral: Secondary | ICD-10-CM | POA: Diagnosis not present

## 2019-08-19 DIAGNOSIS — C50412 Malignant neoplasm of upper-outer quadrant of left female breast: Secondary | ICD-10-CM | POA: Diagnosis not present

## 2019-08-19 MED ORDER — UNABLE TO FIND: 0 refills | Status: DC

## 2019-08-19 MED ORDER — OXYCODONE HCL 5 MG PO TABS: 5.0000 mg | ORAL_TABLET | ORAL | 0 refills | Status: DC | PRN

## 2019-08-19 NOTE — Progress Notes (Signed)
This service is provided via telemedicine  No vital signs collected/recorded due to the encounter was a telemedicine visit.   Location of patient (ex: home, work):  Home   Patient consents to a telephone visit:  Yes  Location of the provider (ex: office, home):  Office   Name of any referring provider: Hollace Kinnier, [place] of all persons participating in the telemedicine service and their role in the encounter:  Baylynn Shifflett NP, Ruthell Rummage CMA, and Vinnie Level and [name] caregiver   Time spent on call:  Ruthell Rummage CMA, spent 9 Minutes on phone with patient.     [place] [facility]  Provider: Marlowe Sax, NP  Code Status: FULL Goals of Care:  Advanced Directives 07/25/2019  Does Patient Have a Medical Advance Directive? Yes  Type of Paramedic of [place];Living will  Does patient want to make changes to medical advance directive? No - Patient declined  Copy of [place] in Chart? No - copy requested  Would patient like information on creating a medical advance directive? -  Pre-existing out of facility DNR order (yellow form or pink MOST form) -     Chief Complaint  Patient presents with   Acute Visit    Complains of foul smelling urine with confusion unable to come to office.     HPI: Patient is a 84 y.o. female seen today for an acute visit for foul smelling urine with increased confusion since discharged from the hospital 08/18/2019.Patient care giver with patient.she denies any fever,chill or dysuria.she states had increased confusion during her recent hospital admission and was treated with I.V antibiotics.  She is status post hospitalization 07/25/2019- 08/06/2019 post mechanical fall at home.she sustained a displaced spiral fracture of the distal right  femoral metadiaphysis.she underwent open reduction internal fixation of right femoral with application of wound VAC on 07/26/2019 by Dr. Lyla Glassing.Her hospital stay was  complicated by acute encephalopathy and drowsiness without any focal neuro deficit. Temp 100.3 WBC 14.5 on 07/31/2019.she had CXR,CT head and urinalysis were negative.she was treated with I.V antibiotics x 5 days with much improvement.Her Afib is under controlled and her home EliQuis was continued.she was seen by PT/OT who recommended SNF with wound vac per Orthopedic.patient is back home with care giver. Patient was discharge back home 08/18/2019.She states Physical Therapy will visit tomorrow.[facility] Nurse was in to visit today prior to visit per patient.  Care giver states having difficulties transferring patient to wheelchair states not moving leg due to pain.Awaiting Physical Therapy to instructed with transfer.she states might need another CNA to assist with transfer but awaiting PT for now will notify provider's office.     Past Medical History:  Diagnosis Date   A-fib ([place])    Anemia    Anxiety    Arthritis    hands, spine    Bladder incontinence    Blood transfusion    Cancer ([place]) 02/2019   left breast cancer   Cervicalgia    Constipation    Cough    [place]-onset 08/2009, as of 2014- resolved    Deep venous thrombosis ([place])    post op, rec'd/needed  blood thinner    Depression    Depression with anxiety    Dysrhythmia    a-fib   Falls frequently    pt. reports that she was falling 3 times a day, has had PT at a facility for a while & now is getting home PT 2-3  times/ week    Headache(784.0) 09/27/2011   History of stress test    15-20 yrs.ago    Hypotension    Hypothyroidism    Lumbar radiculopathy 09/26/2011   Pneumonia    never been in hosp. for pneumonia    Prediabetes    Right hand fracture    Monday   Shortness of breath    Sleep apnea    does not use consistently    Past Surgical History:  Procedure Laterality Date   ANTERIOR CERVICAL DECOMP/DISCECTOMY FUSION N/A 07/09/2013   Procedure: ANTERIOR CERVICAL  DECOMPRESSION/DISCECTOMY FUSION 3 LEVELS;  Surgeon: Sinclair Ship, MD;  Location: [place];  Service: Orthopedics;  Laterality: N/A;  Anterior cervical decompression fusion, cervical 3-4, cervical 4-5-, cervical 5-6 with instrumentation, allograft.   APPENDECTOMY     BACK SURGERY     2000   BREAST EXCISIONAL BIOPSY     breast bxs on left x 2, breast bx of right x 1-all benign   EYE SURGERY     cataracts bilateral /w IOL   HERNIA REPAIR     umbilical hernia- [id number]   IR IMAGING GUIDED PORT INSERTION  04/28/2019   JOINT REPLACEMENT  2006,2007   bilateral   LAPAROSCOPIC OVARIAN CYSTECTOMY     not done by laparscopy-abdominal incision   ORIF PERIPROSTHETIC FRACTURE Right 07/26/2019   Procedure: OPEN REDUCTION INTERNAL FIXATION (ORIF) RIGHT FEMUR PERIPROSTHETIC FRACTURE;  Surgeon: Rod Can, MD;  Location: [place];  Service: Orthopedics;  Laterality: Right;   PORTACATH PLACEMENT Right 04/01/2019   Procedure: ATTEMPTED INSERTION PORT-A-CATH WITH ULTRASOUND;  Surgeon: [name], MD;  Location: [place];  Service: General;  Laterality: Right;   RADIOACTIVE SEED GUIDED PARTIAL MASTECTOMY WITH AXILLARY SENTINEL LYMPH NODE BIOPSY Left 04/01/2019   Procedure: LEFT BREAST PARTIAL MASTECTOMY WITH RADIOACTIVE SEED AND LEFT SENTINEL LYMPH NODE BIOPSY;  Surgeon: [name], MD;  Location: [place];  Service: General;  Laterality: Left;   RE-EXCISION OF BREAST CANCER,SUPERIOR MARGINS Left 04/15/2019   Procedure: RE-EXCISION OF LEFT BREAST CANCER POSITIVE MARGINS;  Surgeon: [name], MD;  Location: [place];  Service: General;  Laterality: Left;   REPLACEMENT TOTAL KNEE BILATERAL     TONSILLECTOMY      Allergies  Allergen Reactions   Clindamycin Hcl Other (See Comments)    REACTION: swelling, pt. Reports that she had a 16 lb. weightgain in one day    Outpatient Encounter Medications as of 08/19/2019  Medication  Sig   anastrozole (ARIMIDEX) 1 MG tablet Take 1 tablet (1 mg total) by mouth daily.   Brexpiprazole 1 MG TABS Take 1 mg by mouth at bedtime.    busPIRone (BUSPAR) 5 MG tablet Take 5-10 mg by mouth 2 (two) times daily. 10 MG IN THE MORNING, 5 MG AT DINNER   CALCIUM CITRATE PO Take 600 mg by mouth daily.    clonazePAM (KLONOPIN) 0.5 MG tablet Take 1 tablet by mouth as needed in middle of the nite to go back to sleep   CRANBERRY PO Take 2 tablets by mouth daily.   DULoxetine (CYMBALTA) 60 MG capsule Take 1 capsule (60 mg total) by mouth daily.   ELIQUIS 5 MG TABS tablet TAKE 1 TABLET BY MOUTH TWICE A DAY (Patient taking differently: Take 5 mg by mouth 2 (two) times daily. )   levothyroxine (SYNTHROID) 50 MCG tablet TAKE ONE TABLET BY MOUTH ONCE DAILY 30 MINUTES BEFORE BREAKFAST FOR THYROID (Patient taking differently: Take 50  mcg by mouth daily before breakfast. Take one tablet by mouth once daily 30 minutes before breakfast for thyroid)   lidocaine (LMX) 4 % cream Apply 1 application topically as needed (port).   mirtazapine (REMERON) 15 MG tablet Take 7.5 mg by mouth at bedtime.   Multiple Vitamins-Minerals (MULTIVITAMIN PO) Take 1 tablet by mouth daily.    nicotine (NICODERM CQ - DOSED IN MG/24 HR) 7 mg/24hr patch Place 1 patch (7 mg total) onto the skin daily.   oxybutynin (DITROPAN-XL) 10 MG 24 hr tablet Take 10 mg by mouth daily.   oxyCODONE (OXY IR/ROXICODONE) 5 MG immediate release tablet Take 1 tablet (5 mg total) by mouth every 4 (four) hours as needed for moderate pain.   senna-docusate (SENOKOT-S) 8.6-50 MG tablet Take 1 tablet by mouth 2 (two) times daily as needed for mild constipation.   traZODone (DESYREL) 100 MG tablet Take 1 tablet (100 mg total) by mouth at bedtime. Take 2 tablets by mouth at bedtime   vitamin B-12 (CYANOCOBALAMIN) 500 MCG tablet Take 500 mcg by mouth daily.   No facility-administered encounter medications on file as of 08/19/2019.     Review  of Systems:  Review of Systems  Constitutional: Negative for appetite change, chills, fatigue and fever.  Respiratory: Negative for cough, chest tightness, shortness of breath and wheezing.   Cardiovascular: Negative for chest pain, palpitations and leg swelling.  Gastrointestinal: Negative for abdominal distention, abdominal pain, constipation, diarrhea, nausea and vomiting.  Endocrine: Negative for cold intolerance, heat intolerance, polydipsia, polyphagia and polyuria.  Genitourinary: Negative for decreased urine volume, difficulty urinating, dysuria, flank pain, frequency and urgency.  Musculoskeletal: Positive for arthralgias and gait problem.       Status post right femur fracture post ORIF with Dr.Swinteck   Skin: Negative for color change, pallor and rash.  Neurological: Negative for dizziness, weakness, light-headedness, numbness and headaches.  Psychiatric/Behavioral: Positive for confusion. Negative for agitation and sleep disturbance. The patient is not nervous/anxious.     Health Maintenance  Topic Date Due   OPHTHALMOLOGY EXAM  01/02/2019   FOOT EXAM  02/15/2019   HEMOGLOBIN A1C  04/16/2019   INFLUENZA VACCINE  06/21/2019   URINE MICROALBUMIN  10/22/2019   TETANUS/TDAP  11/27/2026   DEXA SCAN  Completed   PNA vac Low Risk Adult  Completed    Physical Exam: There were no vitals filed for this visit. There is no height or weight on file to calculate BMI. Physical Exam  Unable to complete on telephone visit. Labs reviewed: Basic Metabolic Panel: Recent Labs    10/16/18 0849 02/20/19 0843  07/30/19 1438 07/31/19 0425  08/02/19 0537 08/03/19 0441 08/05/19 1144 08/06/19 0334  NA 141 140   < >  --  132*   < > 134* 133* 135 138  K 4.0 3.8   < >  --  3.8   < > 3.5 4.1 3.6 3.5  CL 102 102   < >  --  99   < > 103 102 101 102  CO2 30 31   < >  --  23   < > 22 21* 23 25  GLUCOSE 96 109*   < >  --  125*   < > 111* 114* 109* 107*  BUN 31* 22   < >  --  24*   <  > 24* 21 13 14   CREATININE 0.94* 0.95*   < >  --  0.71   < > 0.71 0.64 0.64  0.65  CALCIUM 9.2 9.4   < >  --  8.2*   < > 8.1* 8.3* 8.6* 8.7*  MG  --   --   --   --  1.9   < > 1.7 1.8 1.7  --   PHOS  --   --   --   --  2.5  --   --  2.7  --   --   TSH 2.00 2.27  --  1.414  --   --   --   --   --   --    < > = values in this interval not displayed.   Liver Function Tests: Recent Labs    07/30/19 0428 07/31/19 0425 08/05/19 1144  AST 28 24 22   ALT 28 26 18   ALKPHOS 51 56 57  BILITOT 1.2 0.8 0.9  PROT 5.3* 5.4* 5.2*  ALBUMIN 2.8* 2.8* 2.6*   CBC: Recent Labs    08/04/19 0407 08/05/19 1144 08/06/19 0334  WBC 8.7 9.2 9.1  NEUTROABS 6.3 7.6 6.8  HGB 8.7* 8.8* 8.9*  HCT 26.4* 25.8* 26.4*  MCV 92.6 91.5 93.6  PLT 303 313 307   Lipid Panel: Recent Labs    10/16/18 0849  CHOL 172  HDL 56  LDLCALC 97  TRIG 98  CHOLHDL 3.1   Lab Results  Component Value Date   HGBA1C 5.1 10/16/2018    Procedures since last visit: Dg Chest 1 View  Result Date: 07/25/2019 CLINICAL DATA:  Right femur fracture.  Preoperative study. EXAM: CHEST  1 VIEW COMPARISON:  Chest x-ray dated Apr 01, 2019. FINDINGS: Right chest wall port catheter with tip at the cavoatrial junction. The heart size and mediastinal contours are within normal limits. Atherosclerotic calcification of the aortic arch. Normal pulmonary vascularity. No focal consolidation, pleural effusion, or pneumothorax. No acute osseous abnormality. IMPRESSION: No active disease. Electronically Signed   By: Titus Dubin M.D.   On: 07/25/2019 09:36   Dg Knee 1-2 Views Right  Result Date: 07/25/2019 CLINICAL DATA:  Fall. EXAM: RIGHT KNEE - 1-2 VIEW; RIGHT FEMUR 2 VIEWS COMPARISON:  Right knee x-rays dated April 24, 2013. FINDINGS: Prior right total knee arthroplasty. No evidence of hardware failure or loosening. Acute spiral fracture of the distal femoral metadiaphysis with 1 cm medial displacement and up to 3 cm anterior displacement. The  fracture does not involve the femoral component. No dislocation. No joint effusion. Soft tissues are unremarkable. IMPRESSION: 1. Acute displaced spiral fracture of the distal femoral metadiaphysis. Electronically Signed   By: Titus Dubin M.D.   On: 07/25/2019 09:33   Ct Head Wo Contrast  Result Date: 07/29/2019 CLINICAL DATA:  Encephalopathy EXAM: CT HEAD WITHOUT CONTRAST TECHNIQUE: Contiguous axial images were obtained from the base of the skull through the vertex without intravenous contrast. COMPARISON:  Head CT 07/25/2019 FINDINGS: Brain: There is no acute intracranial hemorrhage or acute demarcated cortical infarction. No evidence of intracranial mass. No midline shift or extra-axial fluid collection. Advanced hypoattenuation within the cerebral white matter is similar to prior examination and nonspecific, but consistent with chronic small vessel ischemic disease. Mild generalized parenchymal atrophy. Vascular: No hyperdense vessel. Atherosclerotic calcification of the carotid artery siphons. Skull: No calvarial fracture or suspicious osseous lesion. Sinuses/Orbits: Imaged orbits demonstrate no acute abnormality. No significant paranasal sinus disease or mastoid effusion at the imaged levels. IMPRESSION: No evidence of acute intracranial abnormality. Mild generalized parenchymal atrophy with advanced chronic small vessel ischemic disease. Electronically Signed  By: Kellie Simmering   On: 07/29/2019 19:33   Ct Head Wo Contrast  Result Date: 07/25/2019 CLINICAL DATA:  Fall on blood thinners. History of breast cancer. EXAM: CT HEAD WITHOUT CONTRAST TECHNIQUE: Contiguous axial images were obtained from the base of the skull through the vertex without intravenous contrast. COMPARISON:  11/23/2013 FINDINGS: Brain: There is no evidence of acute infarct, intracranial hemorrhage, mass, midline shift, or extra-axial fluid collection. Confluent hypodensities in the cerebral white matter bilaterally are stable to  slightly increased and nonspecific but compatible with extensive chronic small vessel ischemic disease. Mild cerebral atrophy is not considered abnormal for age. Vascular: Calcified atherosclerosis at the skull base. No hyperdense vessel. Skull: No fracture focal osseous lesion. Sinuses/Orbits: Visualized paranasal sinuses and mastoid air cells are clear. Bilateral cataract extraction is noted. Other: None. IMPRESSION: 1. No evidence of acute intracranial abnormality. 2. Extensive chronic small vessel ischemic disease. Electronically Signed   By: Logan Bores M.D.   On: 07/25/2019 08:48   Ct Abdomen Pelvis W Contrast  Result Date: 08/01/2019 CLINICAL DATA:  Abdominal pain, fever EXAM: CT ABDOMEN AND PELVIS WITH CONTRAST TECHNIQUE: Multidetector CT imaging of the abdomen and pelvis was performed using the standard protocol following bolus administration of intravenous contrast. CONTRAST:  156mL OMNIPAQUE IOHEXOL 300 MG/ML  SOLN COMPARISON:  None. FINDINGS: Lower chest: No confluent opacities.  Heart is normal size. Hepatobiliary: Hepatic cysts. No suspicious focal hepatic abnormality or biliary ductal dilatation. Gallbladder unremarkable. Pancreas: No focal abnormality or ductal dilatation. Spleen: No focal abnormality.  Normal size. Adrenals/Urinary Tract: No adrenal abnormality. No focal renal abnormality. No stones or hydronephrosis. Urinary bladder is unremarkable. Stomach/Bowel: Stomach, large and small bowel grossly unremarkable. Vascular/Lymphatic: Aortic atherosclerosis. No enlarged abdominal or pelvic lymph nodes. Reproductive: Uterus and adnexa unremarkable.  No mass. Other: No free fluid or free air. Musculoskeletal: No acute bony abnormality. IMPRESSION: No acute findings in the abdomen or pelvis. Aortic atherosclerosis. Hepatic cysts. Electronically Signed   By: Rolm Baptise M.D.   On: 08/01/2019 00:59   Dg Chest Port 1 View  Result Date: 07/31/2019 CLINICAL DATA:  Fever. EXAM: PORTABLE CHEST 1  VIEW COMPARISON:  Radiograph of July 25, 2019. FINDINGS: The heart size and mediastinal contours are within normal limits. No pneumothorax or pleural effusion is noted. Right internal jugular Port-A-Cath is unchanged in position. Left lung is clear. Minimal right basilar subsegmental atelectasis or scarring is noted. The visualized skeletal structures are unremarkable. IMPRESSION: Minimal right basilar subsegmental atelectasis or scarring. Electronically Signed   By: Marijo Conception M.D.   On: 07/31/2019 13:49   Dg C-arm 1-60 Min  Result Date: 07/26/2019 CLINICAL DATA:  Right femoral fracture fixation. EXAM: DG C-ARM 1-60 MIN CONTRAST:  None. FLUOROSCOPY TIME:  Fluoroscopy Time:  2 minutes 20 seconds COMPARISON:  July 26, 2019 FINDINGS: Intraoperative images from sideplate and screw fixation of distal femoral fracture demonstrate placement of hardware with improved alignment at the fracture line. Expected soft tissue swelling. IMPRESSION: Intraoperative images from sideplate and screw fixation of distal femoral fracture with improved alignment at the fracture line. Electronically Signed   By: Fidela Salisbury M.D.   On: 07/26/2019 13:00   Dg Hip Unilat W Or Wo Pelvis 2-3 Views Right  Result Date: 07/25/2019 CLINICAL DATA:  Fall. EXAM: DG HIP (WITH OR WITHOUT PELVIS) 2-3V RIGHT COMPARISON:  Right hip x-rays dated February 24, 2013. FINDINGS: There is no evidence of hip fracture or dislocation. There is no evidence of arthropathy or other  focal bone abnormality. Osteopenia. Degenerative changes of the lumbar spine and sacroiliac joints. Soft tissues are unremarkable. IMPRESSION: No acute osseous abnormality. Electronically Signed   By: Titus Dubin M.D.   On: 07/25/2019 09:35   Dg Femur, Min 2 Views Right  Result Date: 07/26/2019 CLINICAL DATA:  Right femur fixation. EXAM: RIGHT FEMUR 2 VIEWS COMPARISON:  July 25, 2019 FINDINGS: Intraoperative fluoroscopic images from sideplate and screw  fixation of distal femoral fracture demonstrate placement of hardware and improved alignment at the fracture line. Intact right knee arthroplasty is noted. Fluoroscopy time is recorded as 2 minutes 20 seconds. IMPRESSION: Intraoperative fluoroscopic images from sideplate and screw fixation of distal femoral fracture. Electronically Signed   By: Fidela Salisbury M.D.   On: 07/26/2019 12:58   Dg Femur Min 2 Views Right  Result Date: 07/25/2019 CLINICAL DATA:  Fall. EXAM: RIGHT KNEE - 1-2 VIEW; RIGHT FEMUR 2 VIEWS COMPARISON:  Right knee x-rays dated April 24, 2013. FINDINGS: Prior right total knee arthroplasty. No evidence of hardware failure or loosening. Acute spiral fracture of the distal femoral metadiaphysis with 1 cm medial displacement and up to 3 cm anterior displacement. The fracture does not involve the femoral component. No dislocation. No joint effusion. Soft tissues are unremarkable. IMPRESSION: 1. Acute displaced spiral fracture of the distal femoral metadiaphysis. Electronically Signed   By: Titus Dubin M.D.   On: 07/25/2019 09:33   Dg Femur Port, Min 2 Views Right  Result Date: 07/26/2019 CLINICAL DATA:  Status post ORIF of femur fracture. EXAM: RIGHT FEMUR PORTABLE 2 VIEW COMPARISON:  07/25/2019. FINDINGS: Interval open reduction and internal fixation of distal femur fracture. Screw and plate device with cerclage wires are identified and appear to be in anatomic alignment. No complicating features identified. Previous right knee arthroplasty. IMPRESSION: 1. Status post ORIF of distal femur fracture. Electronically Signed   By: Kerby Moors M.D.   On: 07/26/2019 13:14    Assessment/Plan 1. Urinary tract infection symptoms No fever reported.encouraged to increase fluid intake.  -  [facility] Nurse to obtain :   Urine specimen for U/A and C/S rule UTI - CBC/diff and BMP  Dispense: 1 application; Refill: 0  2. Acute right hip pain Statu post ORIF of right right Femur  -  oxyCODONE (OXY IR/ROXICODONE) 5 MG immediate release tablet; Take 1 tablet (5 mg total) by mouth every 4 (four) hours as needed for moderate pain.  Dispense: 30 tablet; Refill: 0 - UNABLE TO FIND; [facility] Nurse to obtain :   CBC/diff and BMP  Dispense: 1 application; Refill: 0 - Follow up with Orthopedic as directed   3. Status post closed fracture of right femur - oxyCODONE (OXY IR/ROXICODONE) 5 MG immediate release tablet; Take 1 tablet (5 mg total) by mouth every 4 (four) hours as needed for moderate pain.  Dispense: 30 tablet; Refill: 0 - UNABLE TO FIND; [facility] Nurse to obtain :  CBC/diff and BMP  Dispense: 1 application; Refill: 0  4. Unsteady gait Requires assistance with transfer. Home Health Physical therapy will visit 08/20/2019. Continue with fall and safety precautions.   5. Confusion Home health Nurse to obtain Urine specimen for U/A and C/S rule UTI. CBC/diff and BMP  Labs/tests ordered:  CBC/diff and BMP and Urine specimen for U/A and C/S rule UTI orders faxed to [facility] Nurse by Ruthell Rummage CMA.  Next appt:  09/04/2019 with Dr.Reed for falls and weakness.   Spent 21 minutes of  non-face to face with patient

## 2019-08-20 ENCOUNTER — Other Ambulatory Visit: Payer: Self-pay | Admitting: *Deleted

## 2019-08-20 NOTE — Patient Outreach (Signed)
Member assessed for potential [facility] Care Management needs as a benefit of  [place] [employer].  Verified in Patient Andrea [name] that member discharged on 08/17/19. Telephone call made to mobile phone number on file. Spoke with daughter Andrea [name]. Patient identifiers confirmed. Andrea [name] reports Andrea [name] is doing well and that member has [facility] home [facility]. Andrea [name] (daughter) also reports that they have caregivers arranged during the day. Andrea [name] states they are currently in contact with PCP regarding potential UTI. Andrea [name] declines [facility] Care Management needs.   Provided writer and [place] Management contact information to the daughter to contact in [facility] services are needed in the future.   Andrea Rolling, MSN-Ed, RN,BSN [place] Acute Care Coordinator [phone] [facility]) [phone]  (Toll free office)

## 2019-08-21 DIAGNOSIS — N390 Urinary tract infection, site not specified: Secondary | ICD-10-CM | POA: Diagnosis not present

## 2019-08-21 DIAGNOSIS — E119 Type 2 diabetes mellitus without complications: Secondary | ICD-10-CM | POA: Diagnosis not present

## 2019-08-21 DIAGNOSIS — I4891 Unspecified atrial fibrillation: Secondary | ICD-10-CM | POA: Diagnosis not present

## 2019-08-21 DIAGNOSIS — C50412 Malignant neoplasm of upper-outer quadrant of left female breast: Secondary | ICD-10-CM | POA: Diagnosis not present

## 2019-08-21 LAB — BASIC METABOLIC PANEL
BUN: 16 (ref 4–21)
Creatinine: 0.9 (ref 0.5–1.1)
Glucose: 107
Potassium: 4.4 (ref 3.4–5.3)
Sodium: 139 (ref 137–147)

## 2019-08-21 LAB — CBC AND DIFFERENTIAL
HCT: 35 — AB (ref 36–46)
Hemoglobin: 11 — AB (ref 12.0–16.0)
Platelets: 219 (ref 150–399)
WBC: 6.1

## 2019-08-22 ENCOUNTER — Ambulatory Visit: Payer: Medicare Other

## 2019-08-22 ENCOUNTER — Ambulatory Visit: Payer: Medicare Other | Admitting: Hematology and Oncology

## 2019-08-22 ENCOUNTER — Other Ambulatory Visit (HOSPITAL_COMMUNITY): Payer: Medicare Other

## 2019-08-22 ENCOUNTER — Other Ambulatory Visit: Payer: Medicare Other

## 2019-08-22 ENCOUNTER — Telehealth: Payer: Self-pay | Admitting: Internal Medicine

## 2019-08-22 DIAGNOSIS — G4733 Obstructive sleep apnea (adult) (pediatric): Secondary | ICD-10-CM

## 2019-08-22 NOTE — Telephone Encounter (Signed)
cpap machine has stopped working. [facility] has looked at the machine & said it needs to be replaced but needs an order.  Order needs to be for cpap machine/mask(nose canula) & supplies  Thanks, Vilinda Blanks

## 2019-08-22 NOTE — Telephone Encounter (Signed)
Stamped and faxed to [facility]

## 2019-08-22 NOTE — Telephone Encounter (Signed)
Prescription should have just printed out.  Sounds like it needs to be faxed to [facility].

## 2019-08-22 NOTE — Addendum Note (Signed)
Addended by: Gayland Curry on: 08/22/2019 02:42 PM   Modules accepted: Orders

## 2019-08-25 ENCOUNTER — Encounter: Payer: Self-pay | Admitting: *Deleted

## 2019-08-28 ENCOUNTER — Encounter: Payer: Self-pay | Admitting: Internal Medicine

## 2019-08-28 ENCOUNTER — Other Ambulatory Visit: Payer: Self-pay

## 2019-08-28 ENCOUNTER — Ambulatory Visit (INDEPENDENT_AMBULATORY_CARE_PROVIDER_SITE_OTHER): Payer: Medicare Other | Admitting: Internal Medicine

## 2019-08-28 DIAGNOSIS — C50412 Malignant neoplasm of upper-outer quadrant of left female breast: Secondary | ICD-10-CM | POA: Diagnosis not present

## 2019-08-28 DIAGNOSIS — Z9989 Dependence on other enabling machines and devices: Secondary | ICD-10-CM | POA: Diagnosis not present

## 2019-08-28 DIAGNOSIS — F05 Delirium due to known physiological condition: Secondary | ICD-10-CM

## 2019-08-28 DIAGNOSIS — N3281 Overactive bladder: Secondary | ICD-10-CM

## 2019-08-28 DIAGNOSIS — Z66 Do not resuscitate: Secondary | ICD-10-CM

## 2019-08-28 DIAGNOSIS — G4733 Obstructive sleep apnea (adult) (pediatric): Secondary | ICD-10-CM

## 2019-08-28 DIAGNOSIS — R0683 Snoring: Secondary | ICD-10-CM

## 2019-08-28 DIAGNOSIS — Z170 Estrogen receptor positive status [ER+]: Secondary | ICD-10-CM | POA: Diagnosis not present

## 2019-08-28 DIAGNOSIS — Z8781 Personal history of (healed) traumatic fracture: Secondary | ICD-10-CM | POA: Diagnosis not present

## 2019-08-28 MED ORDER — OXYCODONE-ACETAMINOPHEN 5-325 MG PO TABS: 1.0000 | ORAL_TABLET | ORAL | 0 refills | Status: DC | PRN

## 2019-08-28 NOTE — Progress Notes (Signed)
Patient ID: Andrea [name], female   DOB: 05-29-1935, 84 y.o.   MRN: [id number]    This service is provided via telemedicine  No vital signs collected/recorded due to the encounter was a telemedicine visit.   Location of patient (ex: home, work):  Home  Patient consents to a telephone visit:  Yes  Location of the provider (ex: office, home): [employer], Office   Name of any referring provider:  N/A  Names of all persons participating in the telemedicine service and their role in the encounter:  Analisse Randle L. Lanitra Battaglini, DO, Chrae B/CMA, Patient, and caregiver (  Time spent on call:  13 min with medical assistant     Location:  [facility] clinic Provider:  Javaeh Muscatello L. Mariea Clonts, D.O., C.M.D.  Code Status: DNR Goals of Care:  Advanced Directives 08/28/2019  Does Patient Have a Medical Advance Directive? Yes  Type of Advance Directive Out of facility DNR (pink MOST or yellow form)  Does patient want to make changes to medical advance directive? No - Patient declined  Copy of [place] in Chart? -  Would patient like information on creating a medical advance directive? -  Pre-existing out of facility DNR order (yellow form or pink MOST form) -     Chief Complaint  Patient presents with  . Face-to-Face    Home Health face-to-face/virtual visit for CPAP   . Urinary Tract Infection    [facility] processed a urinalysis and urine curlture, results were to be sent to Dr.Lauree Yurick  . Medication Management    Pain medication not work, patient would like to know if she can be switched to Percocet 5-325 mg   . Advanced Directive    Re-activate DNR order     HPI: Patient is a 84 y.o. female seen today for multiple issues.    Says her treatment is slow.  She is hurting quite a bit.  Her broken leg hurts her top to bottom.  She has to baby that.  She has to take two rather than one for pain relief.  She's now on oxycodone alone w/o tylenol.    virtual visit for documentation about  CPAP usage and benefit of therapy.    Urine culture was mixed organisms due to clean catch.  Reviewed she must be very clean before the sample.  She was still at [facility] when this started and it looked like she had blood in the urine.  She is not having s/s of UTI now and the sample did not show blood.  Discussed importance of drinking plenty of water.  Is now on oxybutynin which is keeping her incontinence under wraps.    Pain medication is not working for her.  CPAP therapy:  She knows when she doesn't use it, she's like a bull in a [place] closet.  She's exhausted the next day without it.  Last night, she was rooting around all night w/o it.  CPAP setting remains 6cm H20.   She still agrees to DNR order.  This was only discontinued due to need for hip surgery 07/26/19.  She'd been really loopy when she came home from [facility] but got gradually better over the weekend and this week.  Past Medical History:  Diagnosis Date  . A-fib ([place])   . Anemia   . Anxiety   . Arthritis    hands, spine   . Bladder incontinence   . Blood transfusion   . Cancer ([place]) 02/2019   left breast cancer  .  Cervicalgia   . Constipation   . Cough    [place]-onset 08/2009, as of 2014- resolved   . Deep venous thrombosis ([place])    post op, rec'd/needed  blood thinner   . Depression   . Depression with anxiety   . Dysrhythmia    a-fib  . Falls frequently    pt. reports that she was falling 3 times a day, has had PT at a facility for a while & now is getting home PT 2-3 times/ week   . Headache(784.0) 09/27/2011  . History of stress test    15-20 yrs.ago   . Hypotension   . Hypothyroidism   . Lumbar radiculopathy 09/26/2011  . Pneumonia    never been in hosp. for pneumonia   . Prediabetes   . Right hand fracture    Monday  . Shortness of breath   . Sleep apnea    does not use consistently    Past Surgical History:  Procedure Laterality Date  . ANTERIOR CERVICAL DECOMP/DISCECTOMY FUSION N/A  07/09/2013   Procedure: ANTERIOR CERVICAL DECOMPRESSION/DISCECTOMY FUSION 3 LEVELS;  Surgeon: Sinclair Ship, MD;  Location: [place];  Service: Orthopedics;  Laterality: N/A;  Anterior cervical decompression fusion, cervical 3-4, cervical 4-5-, cervical 5-6 with instrumentation, allograft.  . APPENDECTOMY    . BACK SURGERY     2000  . BREAST EXCISIONAL BIOPSY     breast bxs on left x 2, breast bx of right x 1-all benign  . EYE SURGERY     cataracts bilateral /w IOL  . HERNIA REPAIR     umbilical hernia- 6503  . IR IMAGING GUIDED PORT INSERTION  04/28/2019  . JOINT REPLACEMENT  2006,2007   bilateral  . LAPAROSCOPIC OVARIAN CYSTECTOMY     not done by laparscopy-abdominal incision  . ORIF PERIPROSTHETIC FRACTURE Right 07/26/2019   Procedure: OPEN REDUCTION INTERNAL FIXATION (ORIF) RIGHT FEMUR PERIPROSTHETIC FRACTURE;  Surgeon: Rod Can, MD;  Location: [place];  Service: Orthopedics;  Laterality: Right;  . PORTACATH PLACEMENT Right 04/01/2019   Procedure: ATTEMPTED INSERTION PORT-A-CATH WITH ULTRASOUND;  Surgeon: [name], MD;  Location: [place];  Service: General;  Laterality: Right;  . RADIOACTIVE SEED GUIDED PARTIAL MASTECTOMY WITH AXILLARY SENTINEL LYMPH NODE BIOPSY Left 04/01/2019   Procedure: LEFT BREAST PARTIAL MASTECTOMY WITH RADIOACTIVE SEED AND LEFT SENTINEL LYMPH NODE BIOPSY;  Surgeon: [name], MD;  Location: [place];  Service: General;  Laterality: Left;  . RE-EXCISION OF BREAST CANCER,SUPERIOR MARGINS Left 04/15/2019   Procedure: RE-EXCISION OF LEFT BREAST CANCER POSITIVE MARGINS;  Surgeon: [name], MD;  Location: [place];  Service: General;  Laterality: Left;  . REPLACEMENT TOTAL KNEE BILATERAL    . TONSILLECTOMY      Allergies  Allergen Reactions  . Clindamycin Hcl Other (See Comments)    REACTION: swelling, pt. Reports that she had a 16 lb. weightgain in one day    Outpatient Encounter  Medications as of 08/28/2019  Medication Sig  . anastrozole (ARIMIDEX) 1 MG tablet Take 1 tablet (1 mg total) by mouth daily.  . Brexpiprazole 1 MG TABS Take 1 mg by mouth at bedtime.   . busPIRone (BUSPAR) 5 MG tablet Take 5-10 mg by mouth 2 (two) times daily. 10 MG IN THE MORNING, 5 MG AT DINNER  . CALCIUM CITRATE PO Take 600 mg by mouth daily.   . clonazePAM (KLONOPIN) 0.5 MG tablet Take 1 tablet by mouth as needed in middle of the  nite to go back to sleep  . CRANBERRY PO Take 2 tablets by mouth daily.  . DULoxetine (CYMBALTA) 60 MG capsule Take 1 capsule (60 mg total) by mouth daily.  [name] ELIQUIS 5 MG TABS tablet TAKE 1 TABLET BY MOUTH TWICE A DAY  . levothyroxine (SYNTHROID) 50 MCG tablet TAKE ONE TABLET BY MOUTH ONCE DAILY 30 MINUTES BEFORE BREAKFAST FOR THYROID  . lidocaine (LMX) 4 % cream Apply 1 application topically as needed (port).  . mirtazapine (REMERON) 15 MG tablet Take 7.5 mg by mouth at bedtime.  . Multiple Vitamins-Minerals (MULTIVITAMIN PO) Take 1 tablet by mouth daily.   [name] oxybutynin (DITROPAN-XL) 10 MG 24 hr tablet Take 10 mg by mouth daily.  [name] oxyCODONE (OXY IR/ROXICODONE) 5 MG immediate release tablet Take 1 tablet (5 mg total) by mouth every 4 (four) hours as needed for moderate pain.  [name] senna-docusate (SENOKOT-S) 8.6-50 MG tablet Take 1 tablet by mouth 2 (two) times daily as needed for mild constipation.  . traZODone (DESYREL) 100 MG tablet Take 100 mg by mouth at bedtime.  . vitamin B-12 (CYANOCOBALAMIN) 500 MCG tablet Take 500 mcg by mouth daily.  . [DISCONTINUED] UNABLE TO FIND [facility] Nurse to obtain :  1. Urine specimen for U/A and C/S rule UTI 2. CBC/diff and BMP   No facility-administered encounter medications on file as of 08/28/2019.     Review of Systems:  Review of Systems  Constitutional: Positive for malaise/fatigue. Negative for chills and fever.  HENT: Negative for congestion.   Eyes: Negative for blurred vision.       Glasses   Respiratory: Negative for shortness of breath.   Cardiovascular: Negative for chest pain, palpitations and leg swelling.  Gastrointestinal: Positive for constipation. Negative for abdominal pain, blood in stool and melena.  Genitourinary: Negative for dysuria, frequency, hematuria and urgency.       Improved with oxybutynin; no further hematuria  Musculoskeletal: Positive for joint pain. Negative for falls.  Skin: Negative for rash.  Neurological: Negative for loss of consciousness and headaches.  Endo/Heme/Allergies: Bruises/bleeds easily.  Psychiatric/Behavioral: Positive for depression and memory loss. The patient is nervous/anxious and has insomnia.        Not sleeping well w/o CPAP    Health Maintenance  Topic Date Due  . OPHTHALMOLOGY EXAM  01/02/2019  . FOOT EXAM  02/15/2019  . HEMOGLOBIN A1C  04/16/2019  . INFLUENZA VACCINE  06/21/2019  . URINE MICROALBUMIN  10/22/2019  . TETANUS/TDAP  11/27/2026  . DEXA SCAN  Completed  . PNA vac Low Risk Adult  Completed    Physical Exam: There were no vitals filed for this visit. There is no height or weight on file to calculate BMI. Physical Exam Vitals signs reviewed.  Constitutional:      General: She is not in acute distress.    Appearance: She is not toxic-appearing.     Comments: Hair has thinned  HENT:     Head: Normocephalic and atraumatic.  Eyes:     Comments: Wearing glasses  Pulmonary:     Effort: Pulmonary effort is normal.     Breath sounds: Normal breath sounds.  Skin:    General: Skin is warm and dry.     Coloration: Skin is pale.  Neurological:     Mental Status: She is alert and oriented to person, place, and time. Mental status is at baseline.  Psychiatric:        Mood and Affect: Mood normal.  Labs reviewed: Basic Metabolic Panel: Recent Labs    10/16/18 0849 02/20/19 0843  07/30/19 1438 07/31/19 0425  08/02/19 0537 08/03/19 0441 08/05/19 1144 08/06/19 0334 08/21/19  NA 141 140   < >  --   132*   < > 134* 133* 135 138 139  K 4.0 3.8   < >  --  3.8   < > 3.5 4.1 3.6 3.5 4.4  CL 102 102   < >  --  99   < > 103 102 101 102  --   CO2 30 31   < >  --  23   < > 22 21* 23 25  --   GLUCOSE 96 109*   < >  --  125*   < > 111* 114* 109* 107*  --   BUN 31* 22   < >  --  24*   < > 24* '21 13 14 16  ' CREATININE 0.94* 0.95*   < >  --  0.71   < > 0.71 0.64 0.64 0.65 0.9  CALCIUM 9.2 9.4   < >  --  8.2*   < > 8.1* 8.3* 8.6* 8.7*  --   MG  --   --   --   --  1.9   < > 1.7 1.8 1.7  --   --   PHOS  --   --   --   --  2.5  --   --  2.7  --   --   --   TSH 2.00 2.27  --  1.414  --   --   --   --   --   --   --    < > = values in this interval not displayed.   Liver Function Tests: Recent Labs    07/30/19 0428 07/31/19 0425 08/05/19 1144  AST '28 24 22  ' ALT '28 26 18  ' ALKPHOS 51 56 57  BILITOT 1.2 0.8 0.9  PROT 5.3* 5.4* 5.2*  ALBUMIN 2.8* 2.8* 2.6*   No results for input(s): LIPASE, AMYLASE in the last 8760 hours. No results for input(s): AMMONIA in the last 8760 hours. CBC: Recent Labs    08/04/19 0407 08/05/19 1144 08/06/19 0334 08/21/19  WBC 8.7 9.2 9.1 6.1  NEUTROABS 6.3 7.6 6.8  --   HGB 8.7* 8.8* 8.9* 11.0*  HCT 26.4* 25.8* 26.4* 35*  MCV 92.6 91.5 93.6  --   PLT 303 313 307 219   Lipid Panel: Recent Labs    10/16/18 0849  CHOL 172  HDL 56  LDLCALC 97  TRIG 98  CHOLHDL 3.1   Lab Results  Component Value Date   HGBA1C 5.1 10/16/2018   UA from 10/2 with 2+ LE, 6-10 wbc, few bacteria, no blood Urine culture with mixed organisms suggesting contamination (sample obtained by "clean catch" in incontinent female   Procedures since last visit: Ct Head Wo Contrast  Result Date: 07/29/2019 CLINICAL DATA:  Encephalopathy EXAM: CT HEAD WITHOUT CONTRAST TECHNIQUE: Contiguous axial images were obtained from the base of the skull through the vertex without intravenous contrast. COMPARISON:  Head CT 07/25/2019 FINDINGS: Brain: There is no acute intracranial hemorrhage or acute  demarcated cortical infarction. No evidence of intracranial mass. No midline shift or extra-axial fluid collection. Advanced hypoattenuation within the cerebral white matter is similar to prior examination and nonspecific, but consistent with chronic small vessel ischemic disease. Mild generalized parenchymal atrophy. Vascular: No hyperdense vessel. Atherosclerotic calcification of  the carotid artery siphons. Skull: No calvarial fracture or suspicious osseous lesion. Sinuses/Orbits: Imaged orbits demonstrate no acute abnormality. No significant paranasal sinus disease or mastoid effusion at the imaged levels. IMPRESSION: No evidence of acute intracranial abnormality. Mild generalized parenchymal atrophy with advanced chronic small vessel ischemic disease. Electronically Signed   By: Kellie Simmering   On: 07/29/2019 19:33   Ct Abdomen Pelvis W Contrast  Result Date: 08/01/2019 CLINICAL DATA:  Abdominal pain, fever EXAM: CT ABDOMEN AND PELVIS WITH CONTRAST TECHNIQUE: Multidetector CT imaging of the abdomen and pelvis was performed using the standard protocol following bolus administration of intravenous contrast. CONTRAST:  145m OMNIPAQUE IOHEXOL 300 MG/ML  SOLN COMPARISON:  None. FINDINGS: Lower chest: No confluent opacities.  Heart is normal size. Hepatobiliary: Hepatic cysts. No suspicious focal hepatic abnormality or biliary ductal dilatation. Gallbladder unremarkable. Pancreas: No focal abnormality or ductal dilatation. Spleen: No focal abnormality.  Normal size. Adrenals/Urinary Tract: No adrenal abnormality. No focal renal abnormality. No stones or hydronephrosis. Urinary bladder is unremarkable. Stomach/Bowel: Stomach, large and small bowel grossly unremarkable. Vascular/Lymphatic: Aortic atherosclerosis. No enlarged abdominal or pelvic lymph nodes. Reproductive: Uterus and adnexa unremarkable.  No mass. Other: No free fluid or free air. Musculoskeletal: No acute bony abnormality. IMPRESSION: No acute  findings in the abdomen or pelvis. Aortic atherosclerosis. Hepatic cysts. Electronically Signed   By: KRolm BaptiseM.D.   On: 08/01/2019 00:59   Dg Chest Port 1 View  Result Date: 07/31/2019 CLINICAL DATA:  Fever. EXAM: PORTABLE CHEST 1 VIEW COMPARISON:  Radiograph of July 25, 2019. FINDINGS: The heart size and mediastinal contours are within normal limits. No pneumothorax or pleural effusion is noted. Right internal jugular Port-A-Cath is unchanged in position. Left lung is clear. Minimal right basilar subsegmental atelectasis or scarring is noted. The visualized skeletal structures are unremarkable. IMPRESSION: Minimal right basilar subsegmental atelectasis or scarring. Electronically Signed   By: JMarijo ConceptionM.D.   On: 07/31/2019 13:49    Assessment/Plan  1. DNR (do not resuscitate) -continues to request DNR status - DNR (Do Not Resuscitate)  2. Status post closed fracture of right femur - pain was better controlled with percocet than oxycodone so requests this and I ordered it - oxyCODONE-acetaminophen (PERCOCET) 5-325 MG tablet; Take 1 tablet by mouth every 4 (four) hours as needed for severe pain.  Dispense: 180 tablet; Refill: 0  3. OSA on CPAP -had been on 6cm H20 setting since commenced in 2015 -she is in need of a new machine, mask and all associated equipment and oxygen tubing -without the CPAP, she snores loudly, struggles to rest and has excessive daytime sleepiness  4. Delirium due to general medical condition -improved now that she's adjusted back at her sister's home after several transfers of location lately, hip fx, surgery, medication changes, cancer txs  5. Overactive bladder -is now on oxybutynin which is working well for her incontinence (did review it adds to her anticholinergic burden putting her at higher risk for delirium)  6. Snoring -bothersome w/o cpap  7. Malignant neoplasm of upper-outer quadrant of left breast in female, estrogen receptor positive  ([place] -Stage 1A (cT1c, CN0, cM0, G3, ER+, PR+, HER2+ -completed XRT 8/4 and was begun on chemotherapy with first infusion on 8/21, but then was in hospital 9/4 to 9/16 with hip fx  Labs/tests ordered:  No new--just had labs 10/1 Next appt:  Reschedule appt to 2 mos from now as pt does not yet have ramp built to leave her sister's  home so it's quite a production to get her out (was meant to come next week)   Desi Carby L. Raylyn Carton, D.O. [facility] [address], [place] [address] Cell Phone (Mon-Fri 8am-5pm):  [phone] On Call:  [phone] & follow prompts after 5pm & weekends Office Phone:  [phone] Office Fax:  [phone]

## 2019-09-04 ENCOUNTER — Encounter: Payer: Self-pay | Admitting: Internal Medicine

## 2019-09-04 ENCOUNTER — Other Ambulatory Visit: Payer: Self-pay

## 2019-09-04 ENCOUNTER — Ambulatory Visit (INDEPENDENT_AMBULATORY_CARE_PROVIDER_SITE_OTHER): Payer: Medicare Other | Admitting: Internal Medicine

## 2019-09-04 VITALS — BP 124/64 | HR 79 | Temp 97.9°F

## 2019-09-04 DIAGNOSIS — C50412 Malignant neoplasm of upper-outer quadrant of left female breast: Secondary | ICD-10-CM

## 2019-09-04 DIAGNOSIS — Z170 Estrogen receptor positive status [ER+]: Secondary | ICD-10-CM | POA: Diagnosis not present

## 2019-09-04 DIAGNOSIS — Z8781 Personal history of (healed) traumatic fracture: Secondary | ICD-10-CM | POA: Diagnosis not present

## 2019-09-04 DIAGNOSIS — Z716 Tobacco abuse counseling: Secondary | ICD-10-CM

## 2019-09-04 DIAGNOSIS — N898 Other specified noninflammatory disorders of vagina: Secondary | ICD-10-CM | POA: Diagnosis not present

## 2019-09-04 NOTE — Progress Notes (Signed)
This service is provided via telemedicine  No vital signs collected/recorded due to the encounter was a telemedicine visit.   Location of patient (ex: home, work):  Home   Patient consents to a telephone visit:  Yes  Location of the provider (ex: office, home):  Office    Names of all persons participating in the telemedicine service and their role in the encounter:  Jemuel Laursen Tawni Carnes., Ruthell Rummage CMA, Vinnie Level, Deliah Goody (caregiver)  Time spent on call: Ruthell Rummage spent 11 minutes on phone with patient.     Provider:  Rexene Edison. Mariea Clonts, D.O., C.M.D.  Code Status: DNR Goals of Care:  Advanced Directives 08/28/2019  Does Patient Have a Medical Advance Directive? Yes  Type of Advance Directive Out of facility DNR (pink MOST or yellow form)  Does patient want to make changes to medical advance directive? No - Patient declined  Copy of [place] in Chart? -  Would patient like information on creating a medical advance directive? -  Pre-existing out of facility DNR order (yellow form or pink MOST form) -     Chief Complaint  Patient presents with  . Acute Visit    Daughter still believes patient may have bladder infection. Daughter states dark color urine. [employer] feels patient may need urine tested.     HPI: Patient is a 84 y.o. female spoken with by phone today for concerns that pt may still have UTI.  Her daughter has noticed that when she's wiping her, there's been a brown discharge.  She's had no pain with urinating.  She is on arimidex.  No increased frequency or urgency.    She's only to put 30% of her weight on her bad leg.  She feels like she doesn't know how much that is.  Recommended she talk with PT about this.    She is still having one cigarette twice a day.   Past Medical History:  Diagnosis Date  . A-fib ([place])   . Anemia   . Anxiety   . Arthritis    hands, spine   . Bladder incontinence   . Blood transfusion   . Cancer ([place])  02/2019   left breast cancer  . Cervicalgia   . Constipation   . Cough    Wert-onset 08/2009, as of 2014- resolved   . Deep venous thrombosis ([place])    post op, rec'd/needed  blood thinner   . Depression   . Depression with anxiety   . Dysrhythmia    a-fib  . Falls frequently    pt. reports that she was falling 3 times a day, has had PT at a facility for a while & now is getting home PT 2-3 times/ week   . Headache(784.0) 09/27/2011  . History of stress test    15-20 yrs.ago   . Hypotension   . Hypothyroidism   . Lumbar radiculopathy 09/26/2011  . Pneumonia    never been in hosp. for pneumonia   . Prediabetes   . Right hand fracture    Monday  . Shortness of breath   . Sleep apnea    does not use consistently    Past Surgical History:  Procedure Laterality Date  . ANTERIOR CERVICAL DECOMP/DISCECTOMY FUSION N/A 07/09/2013   Procedure: ANTERIOR CERVICAL DECOMPRESSION/DISCECTOMY FUSION 3 LEVELS;  Surgeon: Sinclair Ship, MD;  Location: [facility];  Service: Orthopedics;  Laterality: N/A;  Anterior cervical decompression fusion, cervical 3-4, cervical 4-5-, cervical 5-6 with instrumentation, allograft.  [name]  APPENDECTOMY    . BACK SURGERY     2000  . BREAST EXCISIONAL BIOPSY     breast bxs on left x 2, breast bx of right x 1-all benign  . EYE SURGERY     cataracts bilateral /w IOL  . HERNIA REPAIR     umbilical hernia- [id number]  . IR IMAGING GUIDED PORT INSERTION  04/28/2019  . JOINT REPLACEMENT  2006,2007   bilateral  . LAPAROSCOPIC OVARIAN CYSTECTOMY     not done by laparscopy-abdominal incision  . ORIF PERIPROSTHETIC FRACTURE Right 07/26/2019   Procedure: OPEN REDUCTION INTERNAL FIXATION (ORIF) RIGHT FEMUR PERIPROSTHETIC FRACTURE;  Surgeon: Rod Can, MD;  Location: [facility];  Service: Orthopedics;  Laterality: Right;  . PORTACATH PLACEMENT Right 04/01/2019   Procedure: ATTEMPTED INSERTION PORT-A-CATH WITH ULTRASOUND;  Surgeon: [name], MD;  Location: [facility];  Service: General;  Laterality: Right;  . RADIOACTIVE SEED GUIDED PARTIAL MASTECTOMY WITH AXILLARY SENTINEL LYMPH NODE BIOPSY Left 04/01/2019   Procedure: LEFT BREAST PARTIAL MASTECTOMY WITH RADIOACTIVE SEED AND LEFT SENTINEL LYMPH NODE BIOPSY;  Surgeon: [name], MD;  Location: [facility];  Service: General;  Laterality: Left;  . RE-EXCISION OF BREAST CANCER,SUPERIOR MARGINS Left 04/15/2019   Procedure: RE-EXCISION OF LEFT BREAST CANCER POSITIVE MARGINS;  Surgeon: [name], MD;  Location: [facility];  Service: General;  Laterality: Left;  . REPLACEMENT TOTAL KNEE BILATERAL    . TONSILLECTOMY      Allergies  Allergen Reactions  . Clindamycin Hcl Other (See Comments)    REACTION: swelling, pt. Reports that she had a 16 lb. weightgain in one day    Outpatient Encounter Medications as of 09/04/2019  Medication Sig  . anastrozole (ARIMIDEX) 1 MG tablet Take 1 tablet (1 mg total) by mouth daily.  . Brexpiprazole 1 MG TABS Take 1 mg by mouth at bedtime.   . busPIRone (BUSPAR) 5 MG tablet Take 5-10 mg by mouth 2 (two) times daily. 10 MG IN THE MORNING, 5 MG AT DINNER  . CALCIUM CITRATE PO Take 600 mg by mouth daily.   . clonazePAM (KLONOPIN) 0.5 MG tablet Take 1 tablet by mouth as needed in middle of the nite to go back to sleep  . CRANBERRY PO Take 2 tablets by mouth daily.  . DULoxetine (CYMBALTA) 60 MG capsule Take 1 capsule (60 mg total) by mouth daily.  [name] ELIQUIS 5 MG TABS tablet TAKE 1 TABLET BY MOUTH TWICE A DAY  . levothyroxine (SYNTHROID) 50 MCG tablet TAKE ONE TABLET BY MOUTH ONCE DAILY 30 MINUTES BEFORE BREAKFAST FOR THYROID  . lidocaine (LMX) 4 % cream Apply 1 application topically as needed (port).  . mirtazapine (REMERON) 15 MG tablet Take 7.5 mg by mouth at bedtime.  . Multiple Vitamins-Minerals (MULTIVITAMIN PO) Take 1 tablet by mouth daily.   [name] oxybutynin (DITROPAN-XL) 10 MG 24 hr tablet Take 10 mg by mouth daily.   [name] oxyCODONE-acetaminophen (PERCOCET) 5-325 MG tablet Take 1 tablet by mouth every 4 (four) hours as needed for severe pain.  [name] senna-docusate (SENOKOT-S) 8.6-50 MG tablet Take 1 tablet by mouth 2 (two) times daily as needed for mild constipation.  . traZODone (DESYREL) 100 MG tablet Take 100 mg by mouth at bedtime.  . vitamin B-12 (CYANOCOBALAMIN) 500 MCG tablet Take 500 mcg by mouth daily.   No facility-administered encounter medications on file as of 09/04/2019.     Review of Systems:  Review of Systems  Constitutional: Negative for  chills and fever.  HENT: Positive for hearing loss. Negative for congestion.   Respiratory: Negative for shortness of breath.   Cardiovascular: Negative for chest pain, palpitations and leg swelling.  Gastrointestinal: Negative for abdominal pain.  Genitourinary: Negative for dysuria, flank pain, frequency, hematuria and urgency.       Brown discharge when wiping  Musculoskeletal: Negative for falls.  Skin: Negative for itching and rash.  Neurological: Negative for dizziness and loss of consciousness.  Endo/Heme/Allergies: Does not bruise/bleed easily.  Psychiatric/Behavioral: Positive for depression and memory loss. The patient is nervous/anxious.     Health Maintenance  Topic Date Due  . OPHTHALMOLOGY EXAM  01/02/2019  . FOOT EXAM  02/15/2019  . HEMOGLOBIN A1C  04/16/2019  . INFLUENZA VACCINE  06/21/2019  . URINE MICROALBUMIN  10/22/2019  . TETANUS/TDAP  11/27/2026  . DEXA SCAN  Completed  . PNA vac Low Risk Adult  Completed    Physical Exam: Could not be performed as visit non face-to-face via phone   Labs reviewed: Basic Metabolic Panel: Recent Labs    10/16/18 0849 02/20/19 0843  07/30/19 1438 07/31/19 0425  08/02/19 0537 08/03/19 0441 08/05/19 1144 08/06/19 0334 08/21/19  NA 141 140   < >  --  132*   < > 134* 133* 135 138 139  K 4.0 3.8   < >  --  3.8   < > 3.5 4.1 3.6 3.5 4.4  CL 102 102   < >  --  99   < > 103 102 101 102   --   CO2 30 31   < >  --  23   < > 22 21* 23 25  --   GLUCOSE 96 109*   < >  --  125*   < > 111* 114* 109* 107*  --   BUN 31* 22   < >  --  24*   < > 24* 21 13 14 16   CREATININE 0.94* 0.95*   < >  --  0.71   < > 0.71 0.64 0.64 0.65 0.9  CALCIUM 9.2 9.4   < >  --  8.2*   < > 8.1* 8.3* 8.6* 8.7*  --   MG  --   --   --   --  1.9   < > 1.7 1.8 1.7  --   --   PHOS  --   --   --   --  2.5  --   --  2.7  --   --   --   TSH 2.00 2.27  --  1.414  --   --   --   --   --   --   --    < > = values in this interval not displayed.   Liver Function Tests: Recent Labs    07/30/19 0428 07/31/19 0425 08/05/19 1144  AST 28 24 22   ALT 28 26 18   ALKPHOS 51 56 57  BILITOT 1.2 0.8 0.9  PROT 5.3* 5.4* 5.2*  ALBUMIN 2.8* 2.8* 2.6*   No results for input(s): LIPASE, AMYLASE in the last 8760 hours. No results for input(s): AMMONIA in the last 8760 hours. CBC: Recent Labs    08/04/19 0407 08/05/19 1144 08/06/19 0334 08/21/19  WBC 8.7 9.2 9.1 6.1  NEUTROABS 6.3 7.6 6.8  --   HGB 8.7* 8.8* 8.9* 11.0*  HCT 26.4* 25.8* 26.4* 35*  MCV 92.6 91.5 93.6  --   PLT 303 313 307  219   Lipid Panel: Recent Labs    10/16/18 0849  CHOL 172  HDL 56  LDLCALC 97  TRIG 98  CHOLHDL 3.1   Lab Results  Component Value Date   HGBA1C 5.1 10/16/2018    Assessment/Plan 1. Vaginal discharge -sounds like classic discharge side effect from arimidex -no other s/s concerning for UTI at this time  2. Malignant neoplasm of upper-outer quadrant of left breast in female, estrogen receptor positive ([place]) -continues on arimidex s/p XRT, chemo and left mastectomy  3. Status post closed fracture of right femur -is to be 30% weightbearing--advised to discuss with her PT how to go about this maneuver  4. Tobacco abuse counseling -counseled again about benefit of smoking cessation--she is not ready--she feels like smoking helps her nerves  Labs/tests ordered:  No new Next appt:  10/30/2019 Non face-to-face time spent  on televisit:  21 mins  Synda Bagent L. Dom Haverland, D.O. [facility] [address], [place] [address] Cell Phone (Mon-Fri 8am-5pm):  [phone] On Call:  [phone] & follow prompts after 5pm & weekends Office Phone:  [phone] Office Fax:  [phone]

## 2019-09-08 ENCOUNTER — Other Ambulatory Visit: Payer: Self-pay

## 2019-09-08 ENCOUNTER — Ambulatory Visit (INDEPENDENT_AMBULATORY_CARE_PROVIDER_SITE_OTHER): Payer: Medicare Other | Admitting: Family

## 2019-09-08 ENCOUNTER — Encounter: Payer: Self-pay | Admitting: Family

## 2019-09-08 DIAGNOSIS — R399 Unspecified symptoms and signs involving the genitourinary system: Secondary | ICD-10-CM

## 2019-09-08 MED ORDER — UNABLE TO FIND: 0 refills | Status: DC

## 2019-09-08 NOTE — Progress Notes (Addendum)
This service is provided via telemedicine  No vital signs collected/recorded due to the encounter was a telemedicine visit.   Location of patient (ex: home, work):  Home   Patient consents to a telephone visit:  Yes   Location of the provider (ex: office, home):   Office  Name of any referring provider:  Hollace Kinnier, [place] of all persons participating in the telemedicine service and their role in the encounter:  Marlowe Sax, NP, Ruthell Rummage CMA, Vinnie Level and caregiver [name]   Time spent on call:  Ruthell Rummage CMA spent minutes on phone with patient.   Location:      Place of Service:    Provider: Akane Tessier FNP-C  Gayland Curry, DO  Patient Care Team: Gayland Curry, DO as PCP - General (Geriatric Medicine) Tanda Rockers, MD as Consulting Physician (Pulmonary Disease) Deveron Furlong, NP as Nurse Practitioner (Psychiatry)  Extended Emergency Contact Information Primary Emergency Contact: Wik,Roberta(Bobbi) Address: [place], [place] [address] [name] Phone: [phone] Mobile Phone: [phone] Relation: Daughter Secondary Emergency Contact: Norland,David Address: [address]           [place], [place] [address] [place] Phone: [phone] Work Phone: [phone] Relation: Son  Code Status:  DNR Goals of care: Advanced Directive information Advanced Directives 08/28/2019  Does Patient Have a Medical Advance Directive? Yes  Type of Advance Directive Out of facility DNR (pink MOST or yellow form)  Does patient want to make changes to medical advance directive? No - Patient declined  Copy of [place] in Chart? -  Would patient like information on creating a medical advance directive? -  Pre-existing out of facility DNR order (yellow form or pink MOST form) -     Chief Complaint  Patient presents with  . Acute Visit    Patient has urgency when  urinates wants order to be placed with [facility] to collect urine specimen on patient     HPI:  Pt is a 84 y.o. female seen today for an acute visit for evaluation of urine urgency and burning sensation with urination x 3 days.Care giver also states patient urine is dark orange in color just like when she was at the hospital.she denies any fever,chills,abdominal pain,nausea or vomiting.she has had some confusion per care giver.she has HHN for ongoing right leg fracture.   Past Medical History:  Diagnosis Date  . A-fib ([place])   . Anemia   . Anxiety   . Arthritis    hands, spine   . Bladder incontinence   . Blood transfusion   . Cancer ([place]) 02/2019   left breast cancer  . Cervicalgia   . Constipation   . Cough    Wert-onset 08/2009, as of 2014- resolved   . Deep venous thrombosis ([place])    post op, rec'd/needed  blood thinner   . Depression   . Depression with anxiety   . Dysrhythmia    a-fib  . Falls frequently    pt. reports that she was falling 3 times a day, has had PT at a facility for a while & now is getting home PT 2-3 times/ week   . Headache(784.0) 09/27/2011  . History of stress test    15-20 yrs.ago   . Hypotension   . Hypothyroidism   . Lumbar radiculopathy 09/26/2011  . Pneumonia  never been in hosp. for pneumonia   . Prediabetes   . Right hand fracture    Monday  . Shortness of breath   . Sleep apnea    does not use consistently   Past Surgical History:  Procedure Laterality Date  . ANTERIOR CERVICAL DECOMP/DISCECTOMY FUSION N/A 07/09/2013   Procedure: ANTERIOR CERVICAL DECOMPRESSION/DISCECTOMY FUSION 3 LEVELS;  Surgeon: Sinclair Ship, MD;  Location: [facility];  Service: Orthopedics;  Laterality: N/A;  Anterior cervical decompression fusion, cervical 3-4, cervical 4-5-, cervical 5-6 with instrumentation, allograft.  . APPENDECTOMY    . BACK SURGERY     2000  . BREAST EXCISIONAL BIOPSY     breast bxs on left x 2, breast bx of right x  1-all benign  . EYE SURGERY     cataracts bilateral /w IOL  . HERNIA REPAIR     umbilical hernia- [id number]  . IR IMAGING GUIDED PORT INSERTION  04/28/2019  . JOINT REPLACEMENT  2006,2007   bilateral  . LAPAROSCOPIC OVARIAN CYSTECTOMY     not done by laparscopy-abdominal incision  . ORIF PERIPROSTHETIC FRACTURE Right 07/26/2019   Procedure: OPEN REDUCTION INTERNAL FIXATION (ORIF) RIGHT FEMUR PERIPROSTHETIC FRACTURE;  Surgeon: Rod Can, MD;  Location: [facility];  Service: Orthopedics;  Laterality: Right;  . PORTACATH PLACEMENT Right 04/01/2019   Procedure: ATTEMPTED INSERTION PORT-A-CATH WITH ULTRASOUND;  Surgeon: [name], MD;  Location: [facility];  Service: General;  Laterality: Right;  . RADIOACTIVE SEED GUIDED PARTIAL MASTECTOMY WITH AXILLARY SENTINEL LYMPH NODE BIOPSY Left 04/01/2019   Procedure: LEFT BREAST PARTIAL MASTECTOMY WITH RADIOACTIVE SEED AND LEFT SENTINEL LYMPH NODE BIOPSY;  Surgeon: [name], MD;  Location: [facility];  Service: General;  Laterality: Left;  . RE-EXCISION OF BREAST CANCER,SUPERIOR MARGINS Left 04/15/2019   Procedure: RE-EXCISION OF LEFT BREAST CANCER POSITIVE MARGINS;  Surgeon: [name], MD;  Location: [facility];  Service: General;  Laterality: Left;  . REPLACEMENT TOTAL KNEE BILATERAL    . TONSILLECTOMY      Allergies  Allergen Reactions  . Clindamycin Hcl Other (See Comments)    REACTION: swelling, pt. Reports that she had a 16 lb. weightgain in one day    Outpatient Encounter Medications as of 09/08/2019  Medication Sig  . anastrozole (ARIMIDEX) 1 MG tablet Take 1 tablet (1 mg total) by mouth daily.  . Brexpiprazole 1 MG TABS Take 1 mg by mouth at bedtime.   . busPIRone (BUSPAR) 5 MG tablet Take 5-10 mg by mouth 2 (two) times daily. 10 MG IN THE MORNING, 5 MG AT DINNER  . CALCIUM CITRATE PO Take 600 mg by mouth daily.   . clonazePAM (KLONOPIN) 0.5 MG tablet Take 1 tablet by mouth as  needed in middle of the nite to go back to sleep  . CRANBERRY PO Take 2 tablets by mouth daily.  . DULoxetine (CYMBALTA) 60 MG capsule Take 1 capsule (60 mg total) by mouth daily.  [name] ELIQUIS 5 MG TABS tablet TAKE 1 TABLET BY MOUTH TWICE A DAY  . levothyroxine (SYNTHROID) 50 MCG tablet TAKE ONE TABLET BY MOUTH ONCE DAILY 30 MINUTES BEFORE BREAKFAST FOR THYROID  . lidocaine (LMX) 4 % cream Apply 1 application topically as needed (port).  . mirtazapine (REMERON) 15 MG tablet Take 7.5 mg by mouth at bedtime.  . Multiple Vitamins-Minerals (MULTIVITAMIN PO) Take 1 tablet by mouth daily.   [name] oxybutynin (DITROPAN-XL) 10 MG 24 hr tablet Take 10 mg by mouth daily.  [name]  oxyCODONE-acetaminophen (PERCOCET) 5-325 MG tablet Take 1 tablet by mouth every 4 (four) hours as needed for severe pain.  [name] senna-docusate (SENOKOT-S) 8.6-50 MG tablet Take 1 tablet by mouth 2 (two) times daily as needed for mild constipation.  . traZODone (DESYREL) 100 MG tablet Take 100 mg by mouth at bedtime.  . vitamin B-12 (CYANOCOBALAMIN) 500 MCG tablet Take 500 mcg by mouth daily.   No facility-administered encounter medications on file as of 09/08/2019.     Review of Systems  Constitutional: Negative for appetite change, chills and fever.  Respiratory: Negative for cough, chest tightness, shortness of breath and wheezing.   Cardiovascular: Negative for chest pain and palpitations.       Right leg fracture and swelling   Gastrointestinal: Negative for abdominal distention, abdominal pain, constipation, diarrhea, nausea and vomiting.  Genitourinary: Positive for urgency. Negative for decreased urine volume, difficulty urinating and flank pain.       Burning with urination   Musculoskeletal: Positive for arthralgias and gait problem.  Skin: Negative for color change, pallor and rash.  Neurological: Negative for dizziness, light-headedness and headaches.  Psychiatric/Behavioral: Negative for agitation and sleep disturbance. The  patient is not nervous/anxious.     Immunization History  Administered Date(s) Administered  . Influenza Split 08/14/2013  . Influenza Whole 08/20/2009  . Influenza, High Dose Seasonal PF 09/11/2017, 07/25/2018  . Influenza,inj,Quad PF,6+ Mos 08/04/2014, 09/06/2015, 07/10/2016  . Pneumococcal Conjugate-13 01/14/2015  . Pneumococcal Polysaccharide-23 07/11/2013  . Td 11/20/2005, 11/27/2016   Pertinent  Health Maintenance Due  Topic Date Due  . OPHTHALMOLOGY EXAM  01/02/2019  . FOOT EXAM  02/15/2019  . HEMOGLOBIN A1C  04/16/2019  . INFLUENZA VACCINE  06/21/2019  . URINE MICROALBUMIN  10/22/2019  . DEXA SCAN  Completed  . PNA vac Low Risk Adult  Completed   Fall Risk  08/28/2019 07/03/2019 02/27/2019 01/10/2019 12/10/2018  Falls in the past year? 1 1 0 1 1  Number falls in past yr: 1 1 0 0 0  Comment - - - - -  Injury with Fall? 1 0 0 0 1  Comment Broke right leg - - - -  Risk for fall due to : History of fall(s);Impaired balance/gait - - - -  Follow up - - - - -   There were no vitals filed for this visit. There is no height or weight on file to calculate BMI. Physical Exam  Unable to complete on telephone visit  Labs reviewed: Recent Labs    07/31/19 0425  08/02/19 0537 08/03/19 0441 08/05/19 1144 08/06/19 0334 08/21/19  NA 132*   < > 134* 133* 135 138 139  K 3.8   < > 3.5 4.1 3.6 3.5 4.4  CL 99   < > 103 102 101 102  --   CO2 23   < > 22 21* 23 25  --   GLUCOSE 125*   < > 111* 114* 109* 107*  --   BUN 24*   < > 24* 21 13 14 16   CREATININE 0.71   < > 0.71 0.64 0.64 0.65 0.9  CALCIUM 8.2*   < > 8.1* 8.3* 8.6* 8.7*  --   MG 1.9   < > 1.7 1.8 1.7  --   --   PHOS 2.5  --   --  2.7  --   --   --    < > = values in this interval not displayed.   Recent Labs  07/30/19 0428 07/31/19 0425 08/05/19 1144  AST 28 24 22   ALT 28 26 18   ALKPHOS 51 56 57  BILITOT 1.2 0.8 0.9  PROT 5.3* 5.4* 5.2*  ALBUMIN 2.8* 2.8* 2.6*   Recent Labs    08/04/19 0407 08/05/19 1144  08/06/19 0334 08/21/19  WBC 8.7 9.2 9.1 6.1  NEUTROABS 6.3 7.6 6.8  --   HGB 8.7* 8.8* 8.9* 11.0*  HCT 26.4* 25.8* 26.4* 35*  MCV 92.6 91.5 93.6  --   PLT 303 313 307 219   Lab Results  Component Value Date   TSH 1.414 07/30/2019   Lab Results  Component Value Date   HGBA1C 5.1 10/16/2018   Lab Results  Component Value Date   CHOL 172 10/16/2018   HDL 56 10/16/2018   LDLCALC 97 10/16/2018   TRIG 98 10/16/2018   CHOLHDL 3.1 10/16/2018    Significant Diagnostic Results in last 30 days:  No results found.  Assessment/Plan   Urinary tract infection symptoms Afebrile.Reports burning and urgency with urination.care giver reports dark orange urine and some confusion.encouraged to Increase fluid  intake and Notify Provider's Office if running any fever. - home health Nurse to collect urine specimen for urine Analysis culture and sensitivity.  Family/ staff Communication: Reviewed plan of care with patient and care giver.   Labs/tests ordered: Urine analysis,culture and sensitivity order faxed to [facility] Nurse to collect specimen.   Spent 12 minutes of non-face to face with patient    Sandrea Hughs, NP

## 2019-09-08 NOTE — Patient Instructions (Addendum)
1.Increase fluid  intake   2. Notify Provider's Office if running any fever

## 2019-09-09 DIAGNOSIS — N390 Urinary tract infection, site not specified: Secondary | ICD-10-CM | POA: Diagnosis not present

## 2019-09-11 ENCOUNTER — Telehealth: Payer: Self-pay | Admitting: Hematology and Oncology

## 2019-09-11 NOTE — Telephone Encounter (Signed)
Returned patient's phone call regarding cancelling 10/23 appointments, spoke with patient's daughter and patient has an injury and will not be able to come to her appointments nor do they know when they're able to reschedule for treatments.   Message to provider.

## 2019-09-12 ENCOUNTER — Encounter: Payer: Self-pay | Admitting: *Deleted

## 2019-09-12 ENCOUNTER — Ambulatory Visit: Payer: Medicare Other | Admitting: Hematology and Oncology

## 2019-09-12 ENCOUNTER — Other Ambulatory Visit: Payer: Medicare Other

## 2019-09-12 ENCOUNTER — Ambulatory Visit: Payer: Medicare Other

## 2019-09-17 ENCOUNTER — Other Ambulatory Visit: Payer: Self-pay | Admitting: Internal Medicine

## 2019-09-17 DIAGNOSIS — E039 Hypothyroidism, unspecified: Secondary | ICD-10-CM

## 2019-09-30 ENCOUNTER — Other Ambulatory Visit: Payer: Self-pay | Admitting: *Deleted

## 2019-09-30 DIAGNOSIS — Z8781 Personal history of (healed) traumatic fracture: Secondary | ICD-10-CM

## 2019-09-30 MED ORDER — OXYCODONE-ACETAMINOPHEN 5-325 MG PO TABS: 1.0000 | ORAL_TABLET | ORAL | 0 refills | Status: DC | PRN

## 2019-09-30 NOTE — Telephone Encounter (Signed)
Patient requested refill [facility] Database Verified LR: 08/28/19 Pended Rx and sent to Dr. Mariea Clonts for approval.

## 2019-10-01 ENCOUNTER — Encounter: Payer: Self-pay | Admitting: Family

## 2019-10-01 ENCOUNTER — Telehealth: Payer: Self-pay

## 2019-10-01 ENCOUNTER — Ambulatory Visit (INDEPENDENT_AMBULATORY_CARE_PROVIDER_SITE_OTHER): Payer: Medicare Other | Admitting: Family

## 2019-10-01 ENCOUNTER — Other Ambulatory Visit: Payer: Self-pay

## 2019-10-01 DIAGNOSIS — K1120 Sialoadenitis, unspecified: Secondary | ICD-10-CM | POA: Diagnosis not present

## 2019-10-01 MED ORDER — SACCHAROMYCES BOULARDII 250 MG PO CAPS: 250.0000 mg | ORAL_CAPSULE | Freq: Two times a day (BID) | ORAL | 0 refills | Status: AC

## 2019-10-01 MED ORDER — DOXYCYCLINE HYCLATE 100 MG PO TABS: 100.0000 mg | ORAL_TABLET | Freq: Two times a day (BID) | ORAL | 0 refills | Status: DC

## 2019-10-01 NOTE — Telephone Encounter (Signed)
Andrea [name] patient's home health aide called to get an virtual visit for patient as she has swollen saliva  glands under her right ear appointment made with Dinah for 1:00 pm today

## 2019-10-01 NOTE — Progress Notes (Signed)
This service is provided via telemedicine  No vital signs collected/recorded due to the encounter was a telemedicine visit.   Location of patient (ex: home, work):  Home   Patient consents to a telephone visit:  Yes  Location of the provider (ex: office, home):  Office   Name of any referring provider:  Hollace Kinnier, [place] of all persons participating in the telemedicine service and their role in the encounter:  Lashawn Orrego,NP, Ruthell Rummage CMA, patient, Daughter, and caregiver   Time spent on call: Ruthell Rummage CMA spent 10 minutes  on phone with patient.    Provider: Adrin Julian FNP-C  Gayland Curry, DO  Patient Care Team: Gayland Curry, DO as PCP - General (Geriatric Medicine) Tanda Rockers, MD as Consulting Physician (Pulmonary Disease) Deveron Furlong, NP as Nurse Practitioner (Psychiatry)  Extended Emergency Contact Information Primary Emergency Contact: Gambale,Roberta(Bobbi) Address: [place], [place] [address] [name] of [place] Phone: [phone] Mobile Phone: [phone] Relation: Daughter Secondary Emergency Contact: Arreaga,David Address: [address]           [place], [place] [address] [place] Phone: [phone] Work Phone: [phone] Relation: Son  Code Status:  DNR Goals of care: Advanced Directive information Advanced Directives 08/28/2019  Does Patient Have a Medical Advance Directive? Yes  Type of Advance Directive Out of facility DNR (pink MOST or yellow form)  Does patient want to make changes to medical advance directive? No - Patient declined  Copy of [place] in Chart? -  Would patient like information on creating a medical advance directive? -  Pre-existing out of facility DNR order (yellow form or pink MOST form) -     Chief Complaint  Patient presents with  . Acute Visit    Swollen salvilary glands under right ear, daughter first noticed  sunday evening also states patient had low grade fever sunday or monday but not certain.     HPI:  Pt is a 84 y.o. female seen today for an acute visit for evaluation of swollen salvilary glands on right ear.Patient spoke and daughter spoke with provider via video call.Daughter states  first noticed swelling on sunday evening.she had a low grade fever  99.8 on sunday and until Monday.swelling and redness has spread to in front of the ear and jaw area.she states areas is very tender to touch.Daughter has applied cold and warm compressor with some decreased in swelling.she has also taken Tylenol in between her regular pain medication.she denies any drainage or changes in her ears.she is usually hard of hearing and wears hearing aids. She states awaiting for ordered C-PAP machine.has not been able to sleep well. Her appetite has not been so good with the swelling on the jaw/ear area.states unable to come to the office because she does not have a ramp to get out of the house.     Past Medical History:  Diagnosis Date  . A-fib ([place])   . Anemia   . Anxiety   . Arthritis    hands, spine   . Bladder incontinence   . Blood transfusion   . Cancer ([place]) 02/2019   left breast cancer  . Cervicalgia   . Constipation   . Cough    Wert-onset 08/2009, as of 2014- resolved   . Deep venous thrombosis ([place] [place])    post op, rec'd/needed  blood thinner   . Depression   .  Depression with anxiety   . Dysrhythmia    a-fib  . Falls frequently    pt. reports that she was falling 3 times a day, has had PT at a facility for a while & now is getting home PT 2-3 times/ week   . Headache(784.0) 09/27/2011  . History of stress test    15-20 yrs.ago   . Hypotension   . Hypothyroidism   . Lumbar radiculopathy 09/26/2011  . Pneumonia    never been in hosp. for pneumonia   . Prediabetes   . Right hand fracture    Monday  . Shortness of breath   . Sleep apnea    does not use consistently   Past Surgical History:   Procedure Laterality Date  . ANTERIOR CERVICAL DECOMP/DISCECTOMY FUSION N/A 07/09/2013   Procedure: ANTERIOR CERVICAL DECOMPRESSION/DISCECTOMY FUSION 3 LEVELS;  Surgeon: Sinclair Ship, MD;  Location: [facility];  Service: Orthopedics;  Laterality: N/A;  Anterior cervical decompression fusion, cervical 3-4, cervical 4-5-, cervical 5-6 with instrumentation, allograft.  . APPENDECTOMY    . BACK SURGERY     2000  . BREAST EXCISIONAL BIOPSY     breast bxs on left x 2, breast bx of right x 1-all benign  . EYE SURGERY     cataracts bilateral /w IOL  . HERNIA REPAIR     umbilical hernia- [id number]  . IR IMAGING GUIDED PORT INSERTION  04/28/2019  . JOINT REPLACEMENT  2006,2007   bilateral  . LAPAROSCOPIC OVARIAN CYSTECTOMY     not done by laparscopy-abdominal incision  . ORIF PERIPROSTHETIC FRACTURE Right 07/26/2019   Procedure: OPEN REDUCTION INTERNAL FIXATION (ORIF) RIGHT FEMUR PERIPROSTHETIC FRACTURE;  Surgeon: Rod Can, MD;  Location: [facility];  Service: Orthopedics;  Laterality: Right;  . PORTACATH PLACEMENT Right 04/01/2019   Procedure: ATTEMPTED INSERTION PORT-A-CATH WITH ULTRASOUND;  Surgeon: [name], MD;  Location: [facility];  Service: General;  Laterality: Right;  . RADIOACTIVE SEED GUIDED PARTIAL MASTECTOMY WITH AXILLARY SENTINEL LYMPH NODE BIOPSY Left 04/01/2019   Procedure: LEFT BREAST PARTIAL MASTECTOMY WITH RADIOACTIVE SEED AND LEFT SENTINEL LYMPH NODE BIOPSY;  Surgeon: [name], MD;  Location: [facility];  Service: General;  Laterality: Left;  . RE-EXCISION OF BREAST CANCER,SUPERIOR MARGINS Left 04/15/2019   Procedure: RE-EXCISION OF LEFT BREAST CANCER POSITIVE MARGINS;  Surgeon: [name], MD;  Location: [facility];  Service: General;  Laterality: Left;  . REPLACEMENT TOTAL KNEE BILATERAL    . TONSILLECTOMY      Allergies  Allergen Reactions  . Clindamycin Hcl Other (See Comments)    REACTION: swelling, pt.  Reports that she had a 16 lb. weightgain in one day    Outpatient Encounter Medications as of 10/01/2019  Medication Sig  . anastrozole (ARIMIDEX) 1 MG tablet Take 1 tablet (1 mg total) by mouth daily.  . Brexpiprazole 1 MG TABS Take 1 mg by mouth at bedtime.   . busPIRone (BUSPAR) 5 MG tablet Take 5-10 mg by mouth 2 (two) times daily. 10 MG IN THE MORNING, 5 MG AT DINNER  . CALCIUM CITRATE PO Take 600 mg by mouth daily.   . clonazePAM (KLONOPIN) 0.5 MG tablet Take 1 tablet by mouth as needed in middle of the nite to go back to sleep  . CRANBERRY PO Take 2 tablets by mouth daily.  . DULoxetine (CYMBALTA) 60 MG capsule Take 1 capsule (60 mg total) by mouth daily.  [name] ELIQUIS 5 MG TABS tablet TAKE 1 TABLET  BY MOUTH TWICE A DAY  . levothyroxine (SYNTHROID) 50 MCG tablet TAKE ONE TABLET BY MOUTH ONCE DAILY 30 MINUTES BEFORE BREAKFAST FOR THYROID  . lidocaine (LMX) 4 % cream Apply 1 application topically as needed (port).  . mirtazapine (REMERON) 15 MG tablet Take 7.5 mg by mouth at bedtime.  . Multiple Vitamins-Minerals (MULTIVITAMIN PO) Take 1 tablet by mouth daily.   [name] oxybutynin (DITROPAN-XL) 10 MG 24 hr tablet Take 10 mg by mouth daily.  [name] oxyCODONE-acetaminophen (PERCOCET) 5-325 MG tablet Take 1 tablet by mouth every 4 (four) hours as needed for severe pain.  [name] senna-docusate (SENOKOT-S) 8.6-50 MG tablet Take 1 tablet by mouth 2 (two) times daily as needed for mild constipation.  . traZODone (DESYREL) 100 MG tablet Take 100 mg by mouth at bedtime.  [name] UNABLE TO FIND Med Name: Home Health Nurse to Obtain urine specimen for Urine analysis,culture and Sensitivity  . vitamin B-12 (CYANOCOBALAMIN) 500 MCG tablet Take 500 mcg by mouth daily.   No facility-administered encounter medications on file as of 10/01/2019.     Review of Systems  Constitutional: Negative for chills and fatigue.       Had fever 09/28/2019 -09/29/2019.Appetite not good since she has been sick.   HENT: Positive for hearing  loss. Negative for congestion, rhinorrhea, sinus pressure, sinus pain, sneezing and sore throat.        HOH   Eyes: Positive for visual disturbance. Negative for discharge, redness and itching.       Wears eye glasses   Respiratory: Negative for cough, chest tightness, shortness of breath and wheezing.   Cardiovascular: Negative for chest pain, palpitations and leg swelling.  Musculoskeletal: Positive for arthralgias and gait problem.  Skin: Negative for color change, pallor and rash.  Neurological: Negative for dizziness, light-headedness and headaches.  Psychiatric/Behavioral: Negative for agitation and sleep disturbance. The patient is not nervous/anxious.     Immunization History  Administered Date(s) Administered  . Influenza Split 08/14/2013  . Influenza Whole 08/20/2009  . Influenza, High Dose Seasonal PF 09/11/2017, 07/25/2018  . Influenza,inj,Quad PF,6+ Mos 08/04/2014, 09/06/2015, 07/10/2016  . Pneumococcal Conjugate-13 01/14/2015  . Pneumococcal Polysaccharide-23 07/11/2013  . Td 11/20/2005, 11/27/2016   Pertinent  Health Maintenance Due  Topic Date Due  . OPHTHALMOLOGY EXAM  01/02/2019  . FOOT EXAM  02/15/2019  . HEMOGLOBIN A1C  04/16/2019  . INFLUENZA VACCINE  06/21/2019  . URINE MICROALBUMIN  10/22/2019  . DEXA SCAN  Completed  . PNA vac Low Risk Adult  Completed   Fall Risk  10/01/2019 08/28/2019 07/03/2019 02/27/2019 01/10/2019  Falls in the past year? 1 1 1  0 1  Number falls in past yr: 1 1 1  0 0  Comment - - - - -  Injury with Fall? 1 1 0 0 0  Comment - Broke right leg - - -  Risk for fall due to : - History of fall(s);Impaired balance/gait - - -  Follow up - - - - -   There were no vitals filed for this visit. There is no height or weight on file to calculate BMI. Physical Exam Constitutional:      General: She is not in acute distress.    Appearance: She is not ill-appearing.  HENT:     Head: Normocephalic.     Mouth/Throat:     Mouth: Mucous membranes  are moist.     Pharynx: Oropharynx is clear. No oropharyngeal exudate or posterior oropharyngeal erythema.  Eyes:  General: No scleral icterus.       Right eye: No discharge.        Left eye: No discharge.     Conjunctiva/sclera: Conjunctivae normal.     Comments: Corrective lens in place   Pulmonary:     Effort: Pulmonary effort is normal. No respiratory distress.  Lymphadenopathy:     Cervical: No cervical adenopathy.  Skin:    Comments: Right parotid gland swollen and diffuse redness down to jaw area noted  Neurological:     Mental Status: She is alert. Mental status is at baseline.     Gait: Gait abnormal.  Psychiatric:        Mood and Affect: Mood normal.        Behavior: Behavior normal.        Thought Content: Thought content normal.        Judgment: Judgment normal.    Labs reviewed: Recent Labs    07/31/19 0425  08/02/19 0537 08/03/19 0441 08/05/19 1144 08/06/19 0334 08/21/19  NA 132*   < > 134* 133* 135 138 139  K 3.8   < > 3.5 4.1 3.6 3.5 4.4  CL 99   < > 103 102 101 102  --   CO2 23   < > 22 21* 23 25  --   GLUCOSE 125*   < > 111* 114* 109* 107*  --   BUN 24*   < > 24* 21 13 14 16   CREATININE 0.71   < > 0.71 0.64 0.64 0.65 0.9  CALCIUM 8.2*   < > 8.1* 8.3* 8.6* 8.7*  --   MG 1.9   < > 1.7 1.8 1.7  --   --   PHOS 2.5  --   --  2.7  --   --   --    < > = values in this interval not displayed.   Recent Labs    07/30/19 0428 07/31/19 0425 08/05/19 1144  AST 28 24 22   ALT 28 26 18   ALKPHOS 51 56 57  BILITOT 1.2 0.8 0.9  PROT 5.3* 5.4* 5.2*  ALBUMIN 2.8* 2.8* 2.6*   Recent Labs    08/04/19 0407 08/05/19 1144 08/06/19 0334 08/21/19  WBC 8.7 9.2 9.1 6.1  NEUTROABS 6.3 7.6 6.8  --   HGB 8.7* 8.8* 8.9* 11.0*  HCT 26.4* 25.8* 26.4* 35*  MCV 92.6 91.5 93.6  --   PLT 303 313 307 219   Lab Results  Component Value Date   TSH 1.414 07/30/2019   Lab Results  Component Value Date   HGBA1C 5.1 10/16/2018   Lab Results  Component Value Date    CHOL 172 10/16/2018   HDL 56 10/16/2018   LDLCALC 97 10/16/2018   TRIG 98 10/16/2018   CHOLHDL 3.1 10/16/2018    Significant Diagnostic Results in last 30 days:  No results found.  Assessment/Plan  Parotiditis Afebrile though has been taking Tylenol.swelling and erythema noted.Tender when daughter touch swollen area.continue with warm compressor daily for 5-10 minutes as needed.will treat with a course of Doxycycline twice daily x 10 days along with Probiotics. Encouraged to notify provider if symptoms worsen or not resolved.  - doxycycline (VIBRA-TABS) 100 MG tablet; Take 1 tablet (100 mg total) by mouth 2 (two) times daily.  Dispense: 20 tablet; Refill: 0 - saccharomyces boulardii (FLORASTOR) 250 MG capsule; Take 1 capsule (250 mg total) by mouth 2 (two) times daily for 10 days.  Dispense: 20 capsule; Refill: 0  Family/ staff Communication: Reviewed plan of care with patient and Daughter.   Labs/tests ordered: None   Spent 15 minutes of face to face video call with patient and daughter.    Sandrea Hughs, NP

## 2019-10-01 NOTE — Patient Instructions (Signed)
-   continue with warm compressor to swollen area daily for 5-10 minutes. - Tylenol as needed for pain and fever

## 2019-10-20 ENCOUNTER — Other Ambulatory Visit: Payer: Self-pay | Admitting: *Deleted

## 2019-10-20 ENCOUNTER — Other Ambulatory Visit: Payer: Self-pay | Admitting: Hematology and Oncology

## 2019-10-20 DIAGNOSIS — Z5181 Encounter for therapeutic drug level monitoring: Secondary | ICD-10-CM

## 2019-10-20 DIAGNOSIS — Z79899 Other long term (current) drug therapy: Secondary | ICD-10-CM

## 2019-10-21 ENCOUNTER — Telehealth: Payer: Self-pay | Admitting: *Deleted

## 2019-10-21 NOTE — Telephone Encounter (Signed)
Received call from pt and pt daughter Tammi Klippel requesting a virtual visit with Dr. Lindi Adie to discuss tx in detail vs palliative care considering pt age.  Apt scheduled and pt daughter verbalized understanding.

## 2019-10-21 NOTE — Progress Notes (Signed)
HEMATOLOGY-ONCOLOGY DOXIMITY VISIT PROGRESS NOTE  I connected with Andrea [name] on 10/22/2019 at  3:45 PM EST by Doximity video conference and verified that I am speaking with the correct person using two identifiers.  I discussed the limitations, risks, security and privacy concerns of performing an evaluation and management service by Doximity and the availability of in person appointments.  I also discussed with the patient that there may be a patient responsible charge related to this service. The patient expressed understanding and agreed to proceed.  Patient's Location: Home Physician Location: Clinic  CHIEF COMPLIANT: Follow-up to discuss treatment plan   INTERVAL HISTORY: Andrea [name] is a 84 y.o. female with above-mentioned history of left breast cancer who underwent a lumpectomyfollowed by a re-excision, radiation, adjuvant treatment with Herceptin, and who is currently on anastrozole.   She had fractured her leg and is still undergoing recovery and rehabilitation.  Her performance status has declined dramatically. I discussed her situation with herself and her daughter.  Oncology History  Malignant neoplasm of upper-outer quadrant of left breast in female, estrogen receptor positive ([place])  03/03/2019 Initial Diagnosis   Left breast pain and right nipple discharge, mammogram revealed 1.4 cm left breast mass which measured 1.7 x 1 x 1.5 cm retroareolar with mild nipple retraction, no enlarged lymph nodes: Biopsy revealed grade 3 IDC with DCIS ER 95%, PR 70%, Ki-67 10%, HER-2 positive ratio 2.3 and copy #4.2, T1CN0 stage Ia clinical stage   03/12/2019 Cancer Staging   Staging form: Breast, AJCC 8th Edition - Clinical: Stage IA (cT1c, cN0, cM0, G3, ER+, PR+, HER2+) - Signed by Gardenia Phlegm, NP on 03/12/2019   04/01/2019 Surgery   Left lumpectomy Ninfa [facility]): IDC with DCIS, 2.2cm, HER2 positive by FISH, ER+ (95%), PR+ (70%), Ki67 10% involved margins, 0/1 lymph nodes  negative.    05/09/2019 -  Chemotherapy   The patient had trastuzumab (HERCEPTIN) 750 mg in sodium chloride 0.9 % 250 mL chemo infusion, 735 mg, Intravenous,  Once, 3 of 3 cycles Administration: 750 mg (05/09/2019), 600 mg (05/30/2019), 600 mg (06/20/2019) trastuzumab-dkst (OGIVRI) 600 mg in sodium chloride 0.9 % 250 mL chemo infusion, 600 mg (100 % of original dose 600 mg), Intravenous,  Once, 1 of 14 cycles Dose modification: 600 mg (original dose 600 mg, Cycle 4, Reason: Other (see comments), Comment: Biosimilar Conversion) Administration: 600 mg (07/11/2019)  for chemotherapy treatment.    05/29/2019 -  Radiation Therapy   Adjuvant XRT     REVIEW OF SYSTEMS:   Constitutional: Denies fevers, chills or abnormal weight loss Eyes: Denies blurriness of vision Ears, nose, mouth, throat, and face: Denies mucositis or sore throat Respiratory: Denies cough, dyspnea or wheezes Cardiovascular: Denies palpitation, chest discomfort Gastrointestinal:  Denies nausea, heartburn or change in bowel habits Skin: Denies abnormal skin rashes Lymphatics: Denies new lymphadenopathy or easy bruising Neurological:Denies numbness, tingling or new weaknesses Behavioral/Psych: Mood is stable, no new changes  Extremities: No lower extremity edema Breast: denies any pain or lumps or nodules in either breasts All other systems were reviewed with the patient and are negative.  Observations/Objective:  There were no vitals filed for this visit. There is no height or weight on file to calculate BMI.  I have reviewed the data as listed CMP Latest Ref Rng & Units 08/21/2019 08/06/2019 08/05/2019  Glucose 70 - 99 mg/dL - 107(H) 109(H)  BUN 4 - '21 16 14 13  ' Creatinine 0.5 - 1.1 0.9 0.65 0.64  Sodium 137 - 147  139 138 135  Potassium 3.4 - 5.3 4.4 3.5 3.6  Chloride 98 - 111 mmol/L - 102 101  CO2 22 - 32 mmol/L - 25 23  Calcium 8.9 - 10.3 mg/dL - 8.7(L) 8.6(L)  Total Protein 6.5 - 8.1 g/dL - - 5.2(L)  Total Bilirubin  0.3 - 1.2 mg/dL - - 0.9  Alkaline Phos 38 - 126 U/L - - 57  AST 15 - 41 U/L - - 22  ALT 0 - 44 U/L - - 18    Lab Results  Component Value Date   WBC 6.1 08/21/2019   HGB 11.0 (A) 08/21/2019   HCT 35 (A) 08/21/2019   MCV 93.6 08/06/2019   PLT 219 08/21/2019   NEUTROABS 6.8 08/06/2019      Assessment Plan:  Malignant neoplasm of upper-outer quadrant of left breast in female, estrogen receptor positive ([facility]) 03/03/2019:Left breast pain and right nipple discharge, mammogram revealed 1.4 cm left breast mass which measured 1.7 x 1 x 1.5 cm retroareolar with mild nipple retraction, no enlarged lymph nodes: Biopsy revealed grade 3 IDC with DCIS ER 95%, PR 70%, Ki-67 10%, HER-2 positive ratio 2.3 and copy #4.2, T1CN0 stage Ia clinical stage  04/01/2019: Left lumpectomy:(Andrea [name]): IDC with DCIS, 2.2cm, HER2 positive by FISH, ER+ (95%), PR+ (70%), Ki67 10% involved margins, 0/1 lymph nodes negative. 04/15/2019: Resection of the inferior margin: Benign  Treatment plan: 1.Adjuvant Herceptin started 05/09/2019 discontinued August 2020 when she had fracture 2.Adjuvant radiation therapy started 05/29/2019 3.Adjuvant antiestrogen therapy.:  Discontinued 10/22/2019 because of osteoporotic fracture and declining performance status  Treatment plan: We had lengthy discussion about goals of care.  Patient is extremely frail and with the fracture she is being set back profoundly. Based on this we decided to discontinue further Herceptin and also discontinue anastrozole therapy. Goal is comfort care.  She does not need routine follow-ups with [place] and can be seen on an as-needed basis.  I discussed the assessment and treatment plan with the patient. The patient was provided an opportunity to ask questions and all were answered. The patient agreed with the plan and demonstrated an understanding of the instructions. The patient was advised to call back or seek an in-person evaluation if the symptoms  worsen or if the condition fails to improve as anticipated.   I provided 15 minutes of face-to-face Doximity time during this encounter.    Rulon Eisenmenger, MD 10/22/2019   I, Molly Dorshimer, am acting as scribe for Nicholas Lose, MD.  I have reviewed the above documentation for accuracy and completeness, and I agree with the above.

## 2019-10-22 ENCOUNTER — Inpatient Hospital Stay: Payer: Medicare Other | Attending: Hematology and Oncology | Admitting: Hematology and Oncology

## 2019-10-22 DIAGNOSIS — Z170 Estrogen receptor positive status [ER+]: Secondary | ICD-10-CM

## 2019-10-22 DIAGNOSIS — C50412 Malignant neoplasm of upper-outer quadrant of left female breast: Secondary | ICD-10-CM | POA: Diagnosis not present

## 2019-10-22 NOTE — Assessment & Plan Note (Signed)
03/03/2019:Left breast pain and right nipple discharge, mammogram revealed 1.4 cm left breast mass which measured 1.7 x 1 x 1.5 cm retroareolar with mild nipple retraction, no enlarged lymph nodes: Biopsy revealed grade 3 IDC with DCIS ER 95%, PR 70%, Ki-67 10%, HER-2 positive ratio 2.3 and copy #4.2, T1CN0 stage Ia clinical stage  04/01/2019: Left lumpectomy:([facility]): IDC with DCIS, 2.2cm, HER2 positive by FISH, ER+ (95%), PR+ (70%), Ki67 10% involved margins, 0/1 lymph nodes negative. 04/15/2019: Resection of the inferior margin: Benign  Treatment plan: 1.Adjuvant Herceptin started 05/09/2019 2.Adjuvant radiation therapy started 05/29/2019 3.Adjuvant antiestrogen therapy.  We gave her preoperative anastrozole and she tolerated it very well. She can continue anastrozole during radiation as well.  Return to clinic every 3 weeks for Herceptin every 6 weeks of follow-up with me.

## 2019-10-23 ENCOUNTER — Other Ambulatory Visit (HOSPITAL_COMMUNITY): Payer: Medicare Other

## 2019-10-23 ENCOUNTER — Encounter: Payer: Self-pay | Admitting: *Deleted

## 2019-10-24 ENCOUNTER — Ambulatory Visit: Payer: Medicare Other

## 2019-10-24 ENCOUNTER — Other Ambulatory Visit: Payer: Medicare Other

## 2019-10-24 ENCOUNTER — Ambulatory Visit: Payer: Medicare Other | Admitting: Hematology and Oncology

## 2019-10-27 DIAGNOSIS — M9701XD Periprosthetic fracture around internal prosthetic right hip joint, subsequent encounter: Secondary | ICD-10-CM | POA: Diagnosis not present

## 2019-10-29 ENCOUNTER — Other Ambulatory Visit (HOSPITAL_COMMUNITY): Payer: Medicare Other

## 2019-10-30 ENCOUNTER — Ambulatory Visit: Payer: Medicare Other | Admitting: Internal Medicine

## 2019-11-06 IMAGING — CT CT HEAD W/O CM
3 series · 16 of 47 positions shown, 19 images · non-contrast
Comparison: 11/23/2013

CLINICAL DATA: Fall on blood thinners. History of breast cancer.

EXAM:
CT HEAD WITHOUT CONTRAST
TECHNIQUE: Contiguous axial images were obtained from the base of the skull
through the vertex without intravenous contrast.

[Series 2: head wo · axial · 0.47mm/px · z∈[-127,+8]mm · 10 of 33 slices shown, 13 images]
[im 3/33  brain]
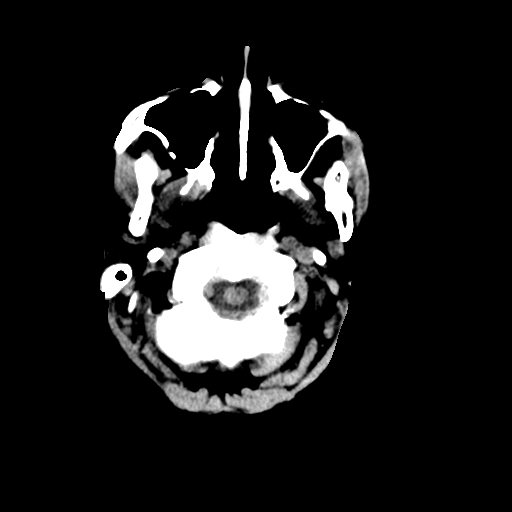
[im 3/33  bone]
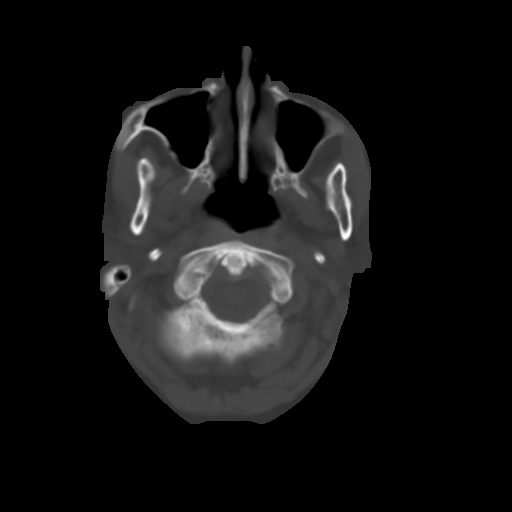
[im 6/33  brain]
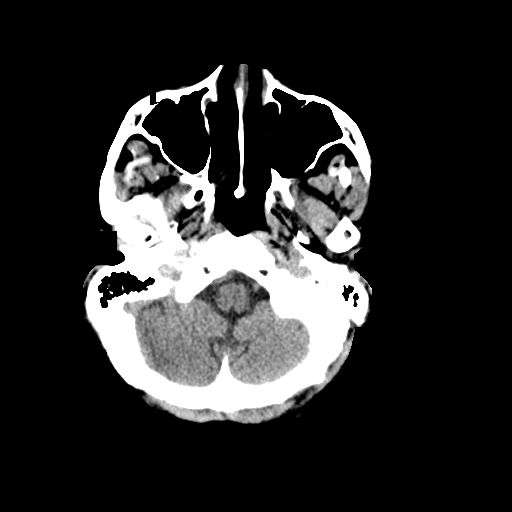
[im 9/33  brain]
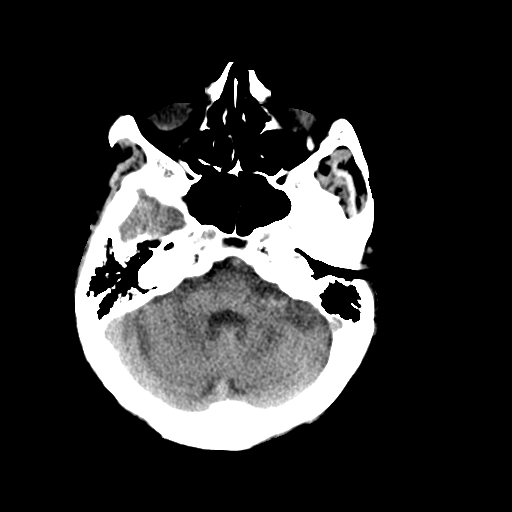
[im 12/33  brain]
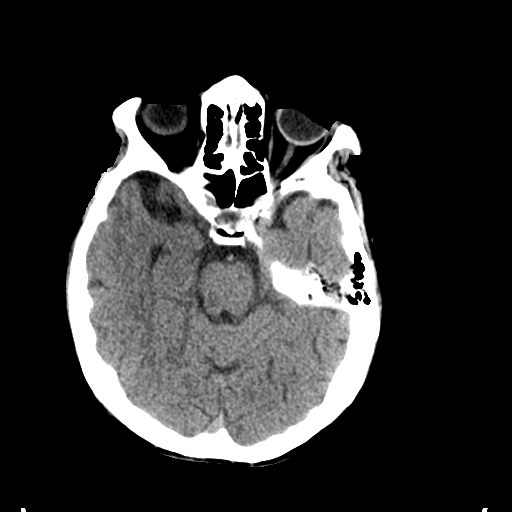
[im 15/33  brain]
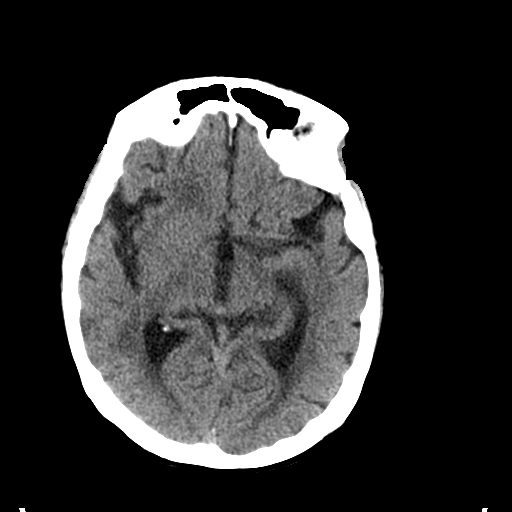
[im 15/33  bone]
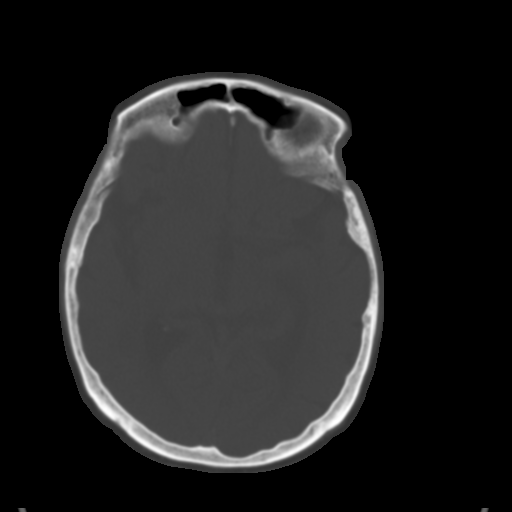
[im 18/33  brain]
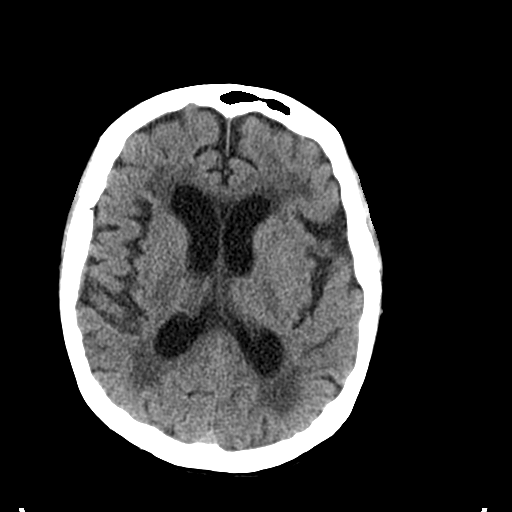
[im 21/33  brain]
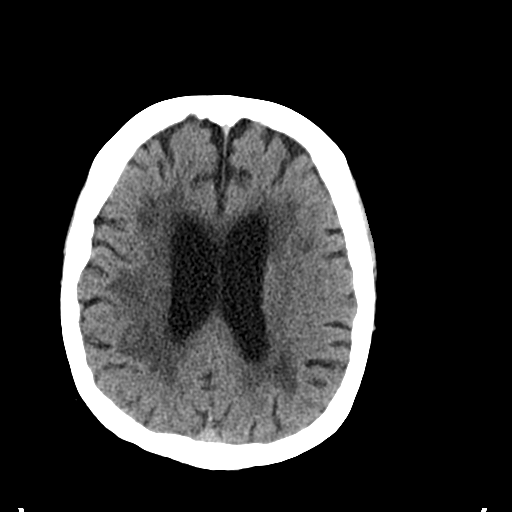
[im 25/33  brain]
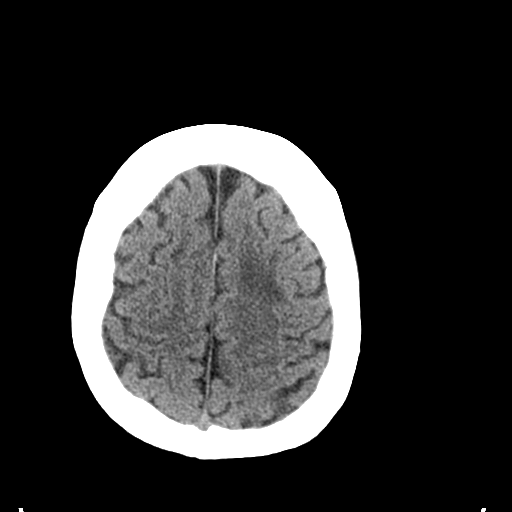
[im 27/33  brain]
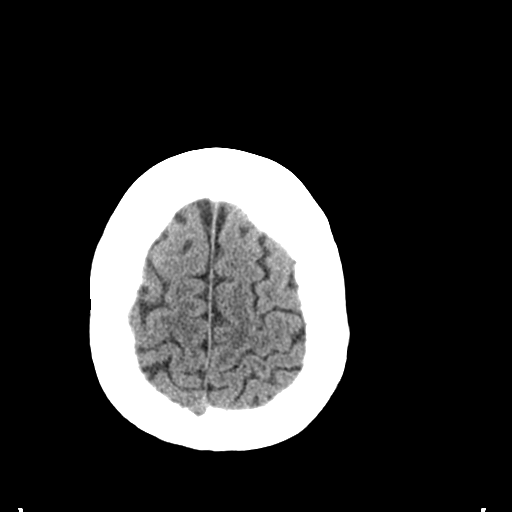
[im 27/33  bone]
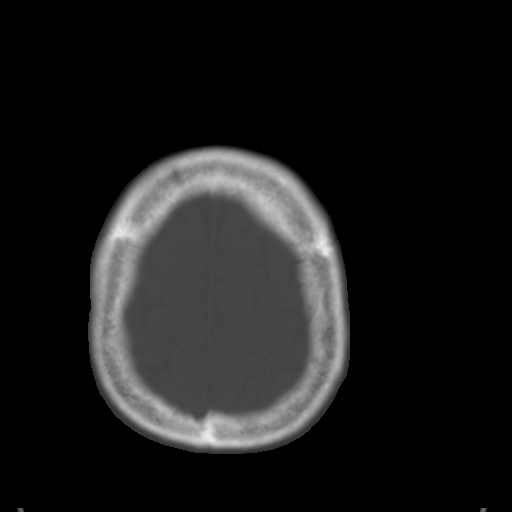
[im 30/33  brain]
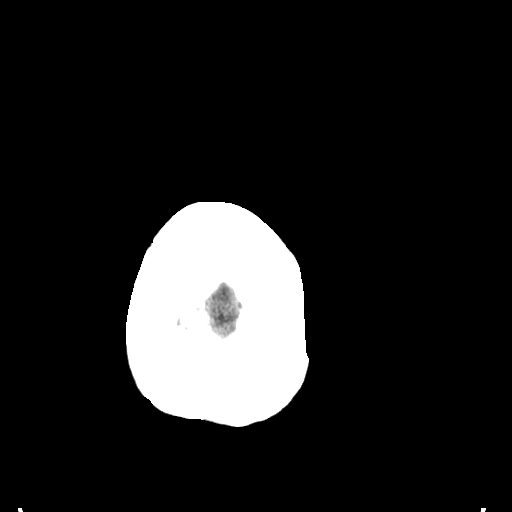

[Series 4: coronal soft tissue · coronal · 0.32mm/px · 3 of 64 slices shown]
[im 22/64  brain]
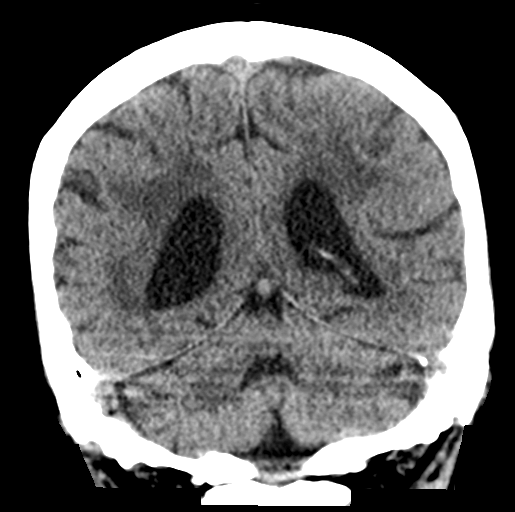
[im 29/64  brain]
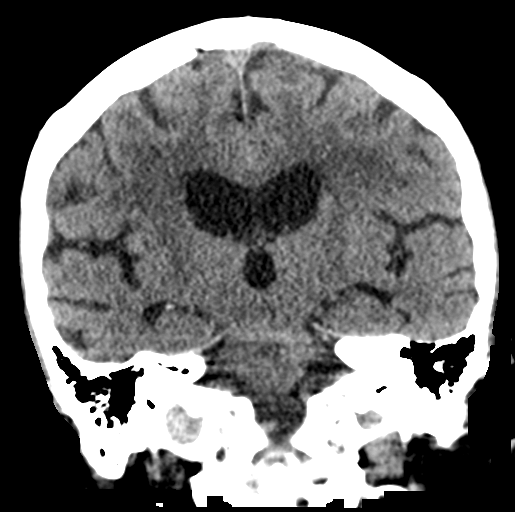
[im 36/64  brain]
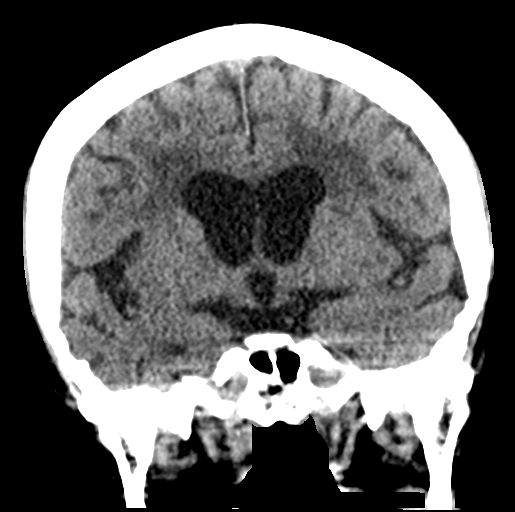

[Series 5: sagittal soft tissue · sagittal · 0.32mm/px · 3 of 52 slices shown]
[im 18/52  brain]
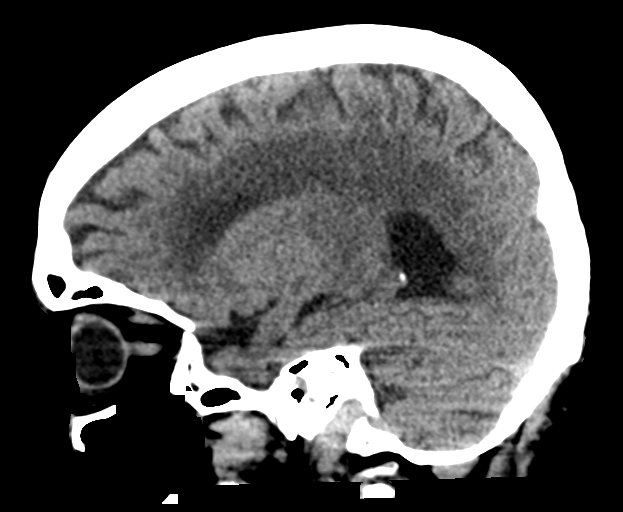
[im 26/52  brain]
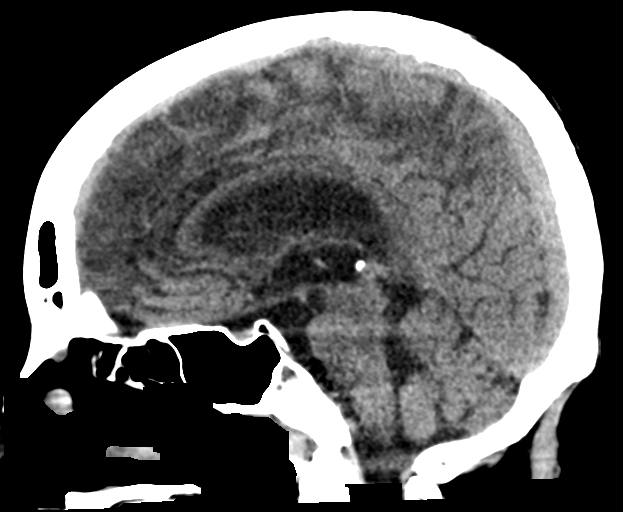
[im 35/52  brain]
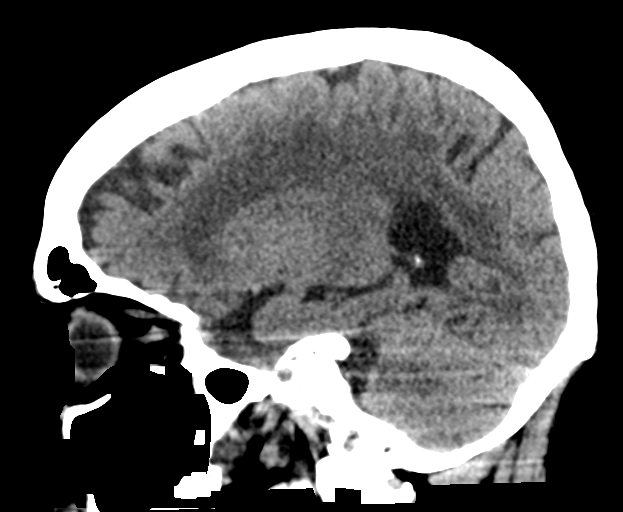

[16 of 47 positions shown; findings below may reference images not displayed]

FINDINGS: Brain: There is no evidence of acute infarct, intracranial
hemorrhage, mass, midline shift, or extra-axial fluid collection.
Confluent hypodensities in the cerebral white matter bilaterally are
stable to slightly increased and nonspecific but compatible with
extensive chronic small vessel ischemic disease. Mild cerebral
atrophy is not considered abnormal for age.

Vascular: Calcified atherosclerosis at the skull base. No hyperdense
vessel.

Skull: No fracture focal osseous lesion.

Sinuses/Orbits: Visualized paranasal sinuses and mastoid air cells
are clear. Bilateral cataract extraction is noted.

Other: None.
IMPRESSION: 1. No evidence of acute intracranial abnormality.
2. Extensive chronic small vessel ischemic disease.

## 2019-11-06 IMAGING — CR DG HIP (WITH OR WITHOUT PELVIS) 2-3V*R*
3 series · 3 of 3 positions shown · non-contrast
Comparison: Right hip x-rays dated February 24, 2013.

CLINICAL DATA: Fall.

EXAM:
DG HIP (WITH OR WITHOUT PELVIS) 2-3V RIGHT

[x pelvis]
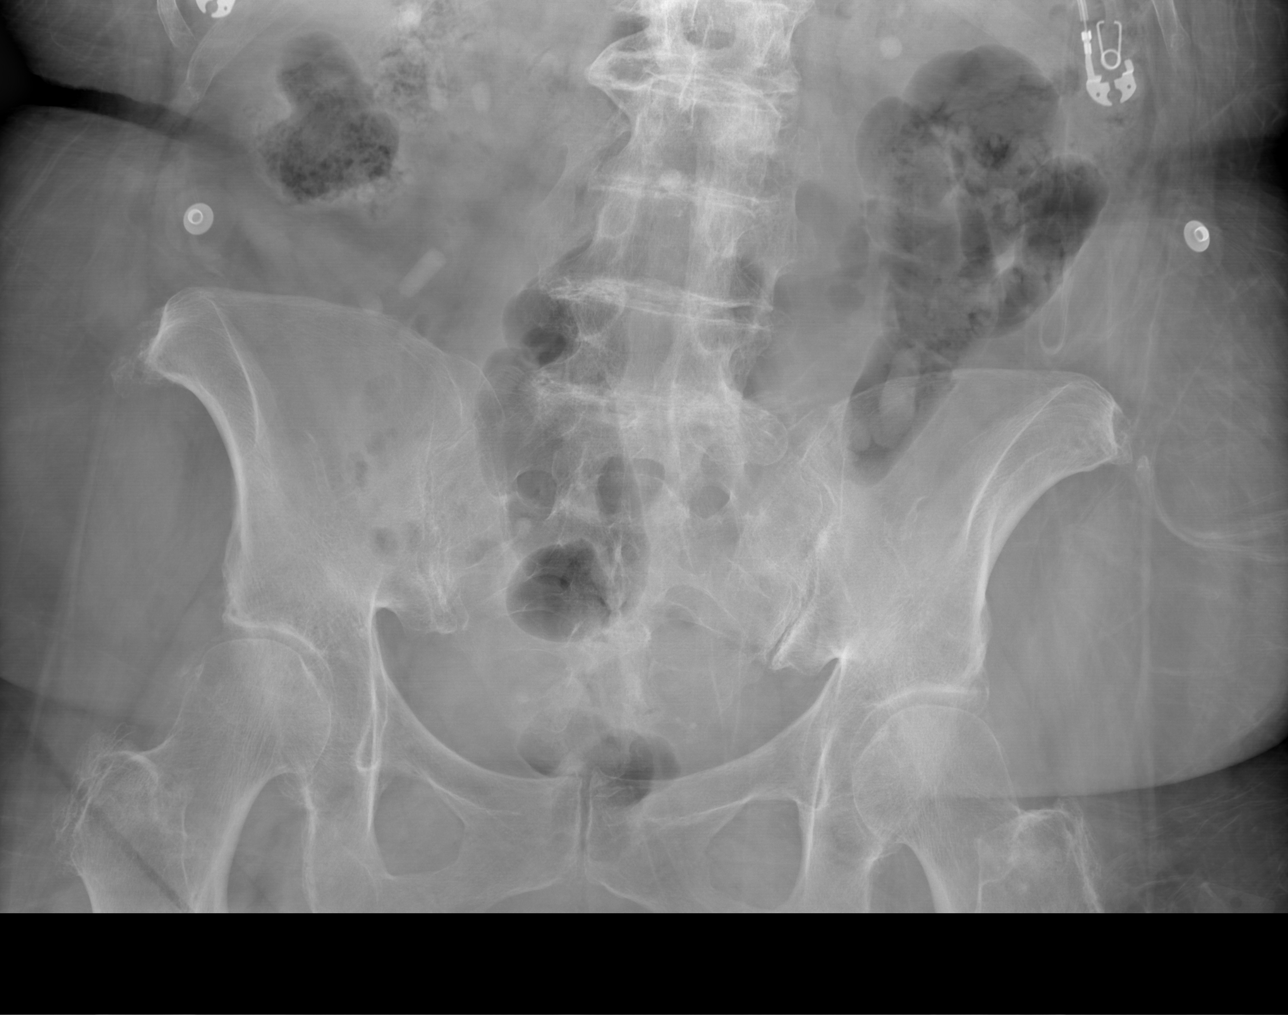

[x hip ap right]
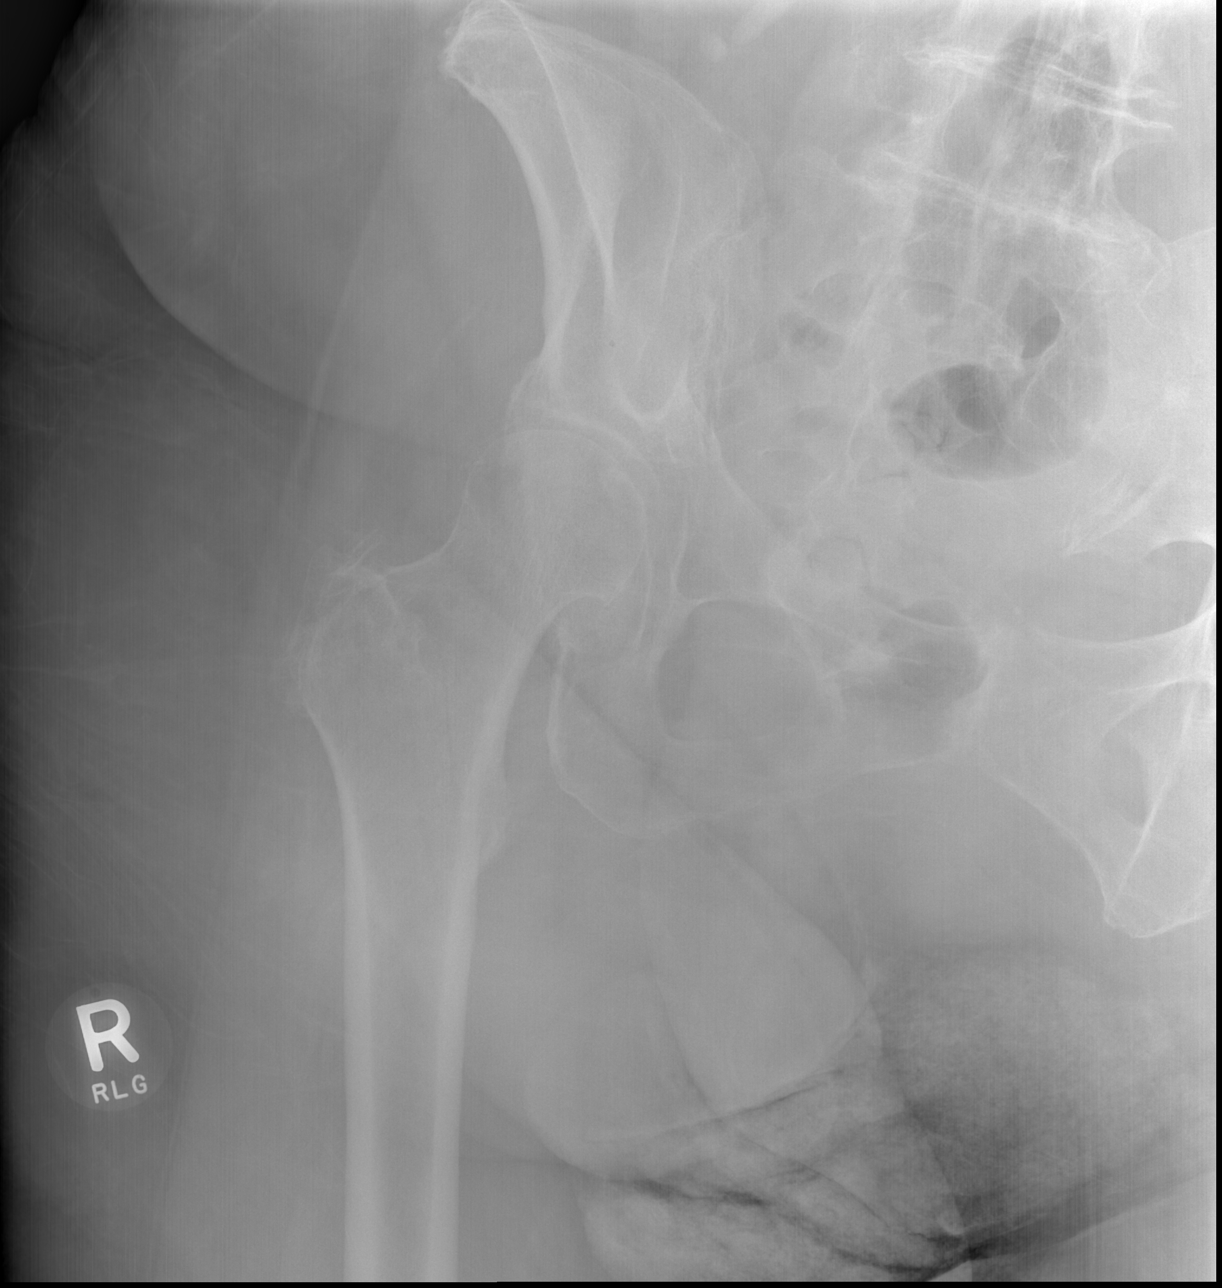

[w hip lat right]
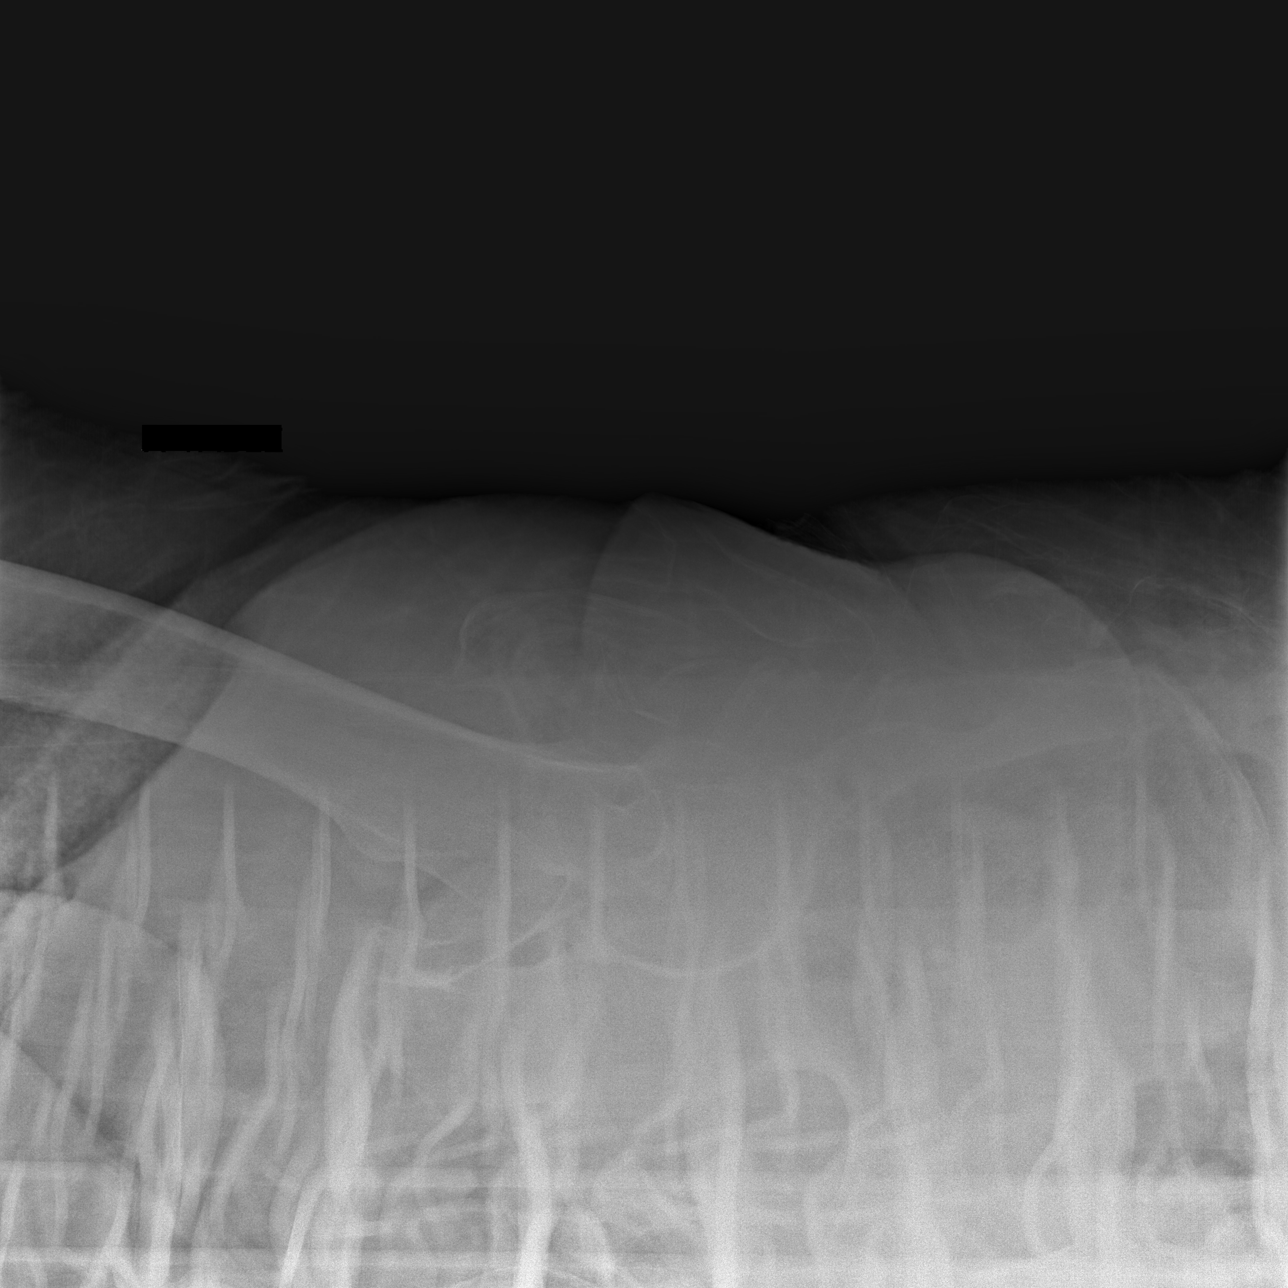

[3 of 3 positions shown; findings below may reference images not displayed]

FINDINGS: There is no evidence of hip fracture or dislocation. There is no
evidence of arthropathy or other focal bone abnormality. Osteopenia.
Degenerative changes of the lumbar spine and sacroiliac joints. Soft
tissues are unremarkable.
IMPRESSION: No acute osseous abnormality.

## 2019-11-07 IMAGING — RF DG C-ARM 1-60 MIN
1 series · 5 of 5 positions shown · IV contrast (agent unspecified)
Comparison: July 26, 2019

CLINICAL DATA: Right femoral fracture fixation.

EXAM:
DG C-ARM 1-60 MIN
CONTRAST:  None.
FLUOROSCOPY TIME:  Fluoroscopy Time:  2 minutes 20 seconds

[Series 1: run · 5 of 5 slices shown]
[im 1/5]
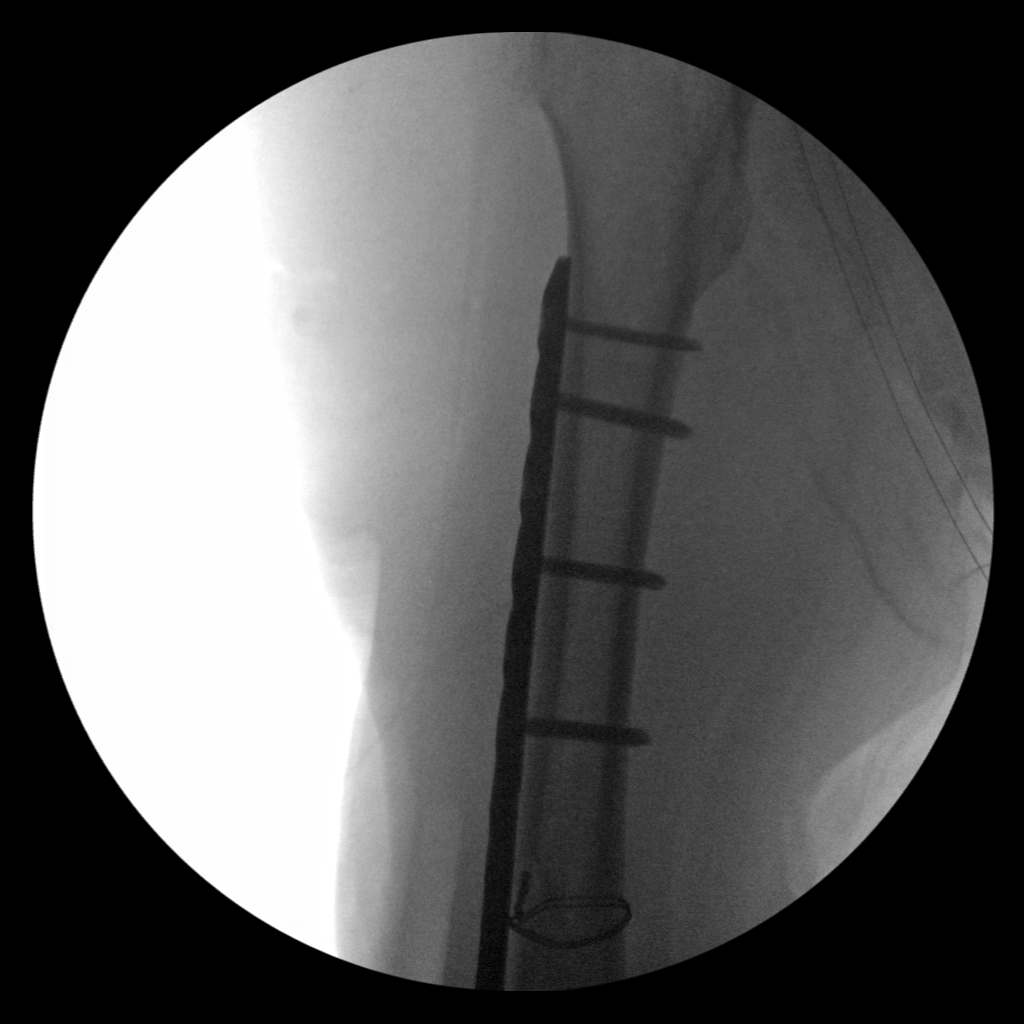
[im 2/5]
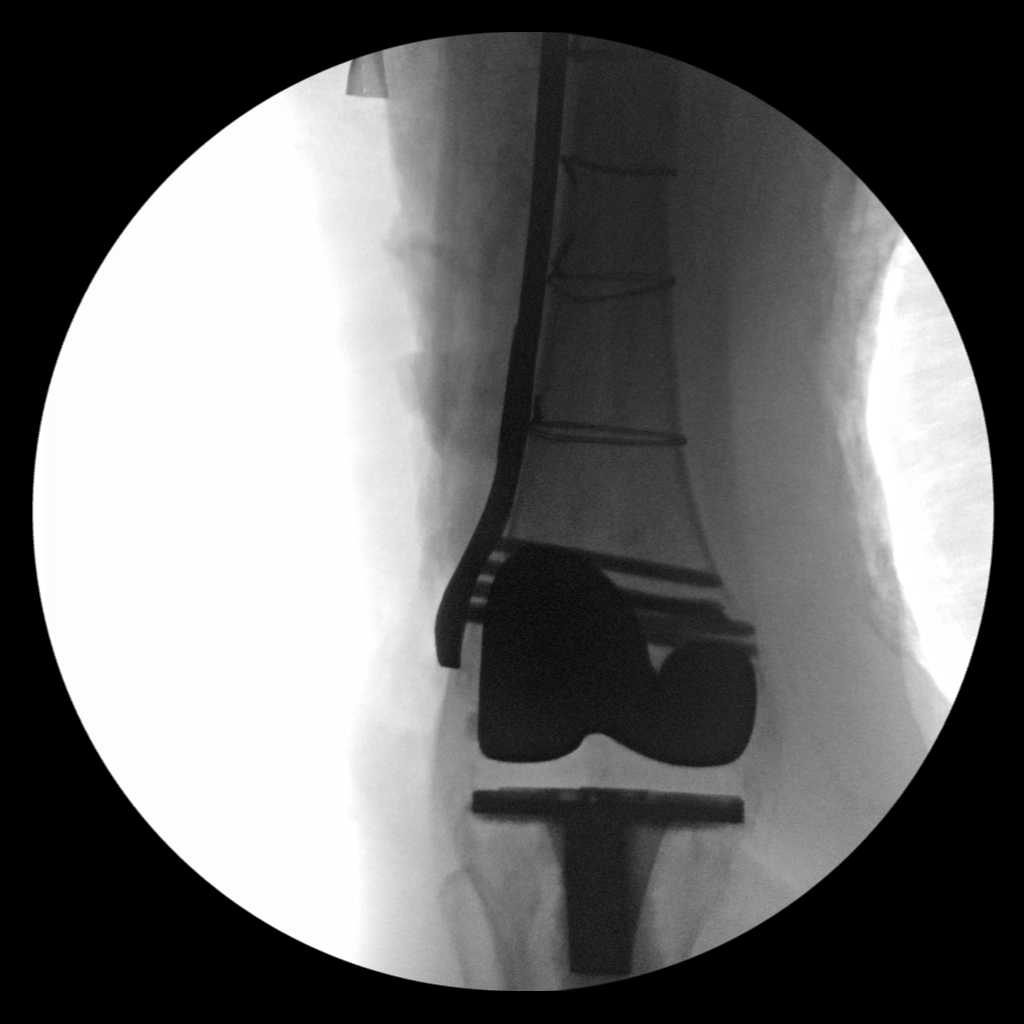
[im 3/5]
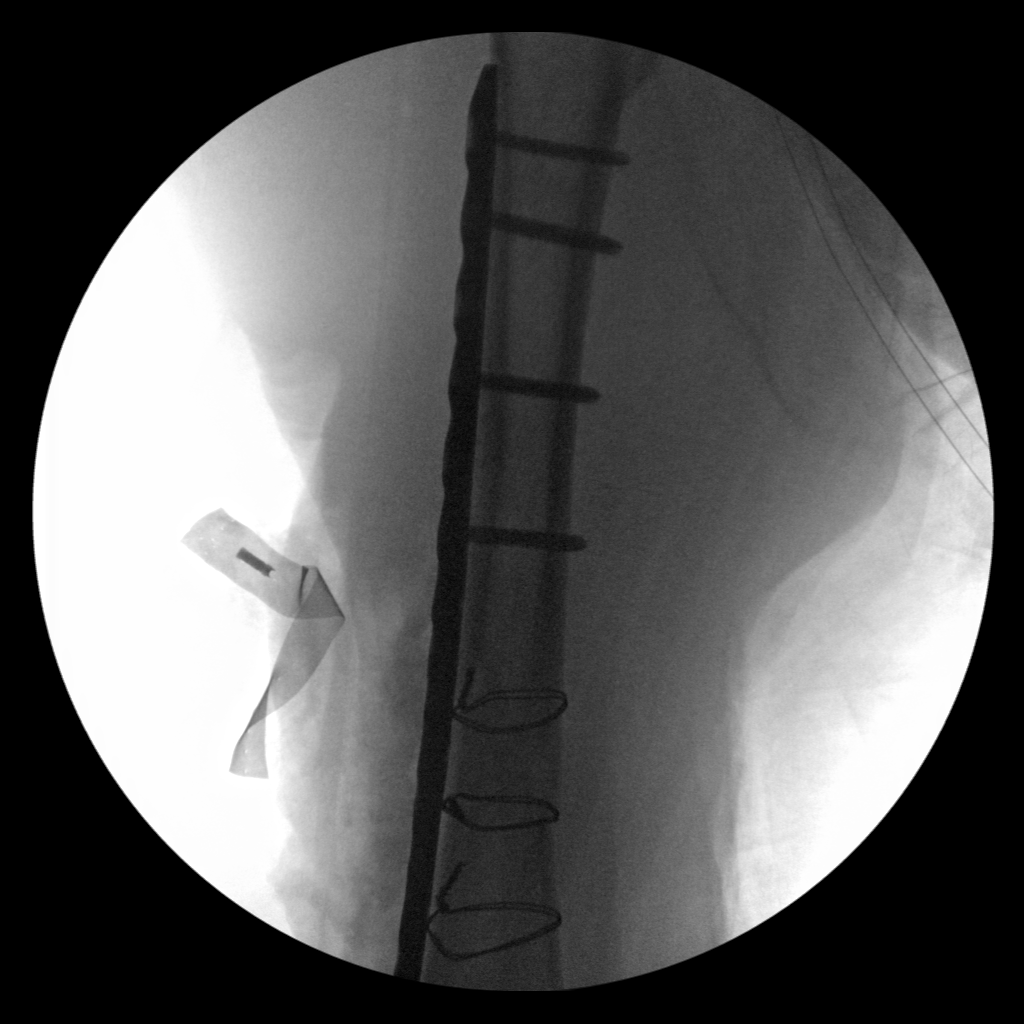
[im 4/5]
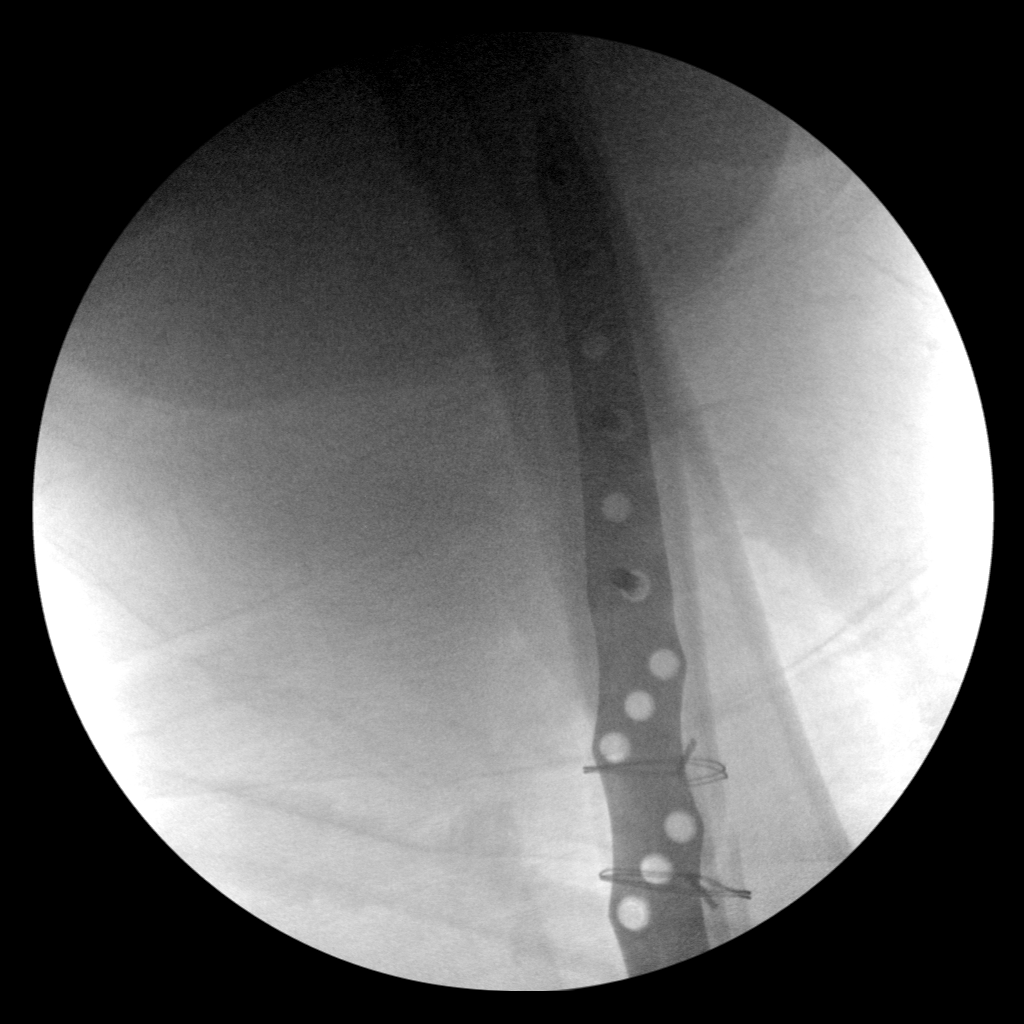
[im 5/5]
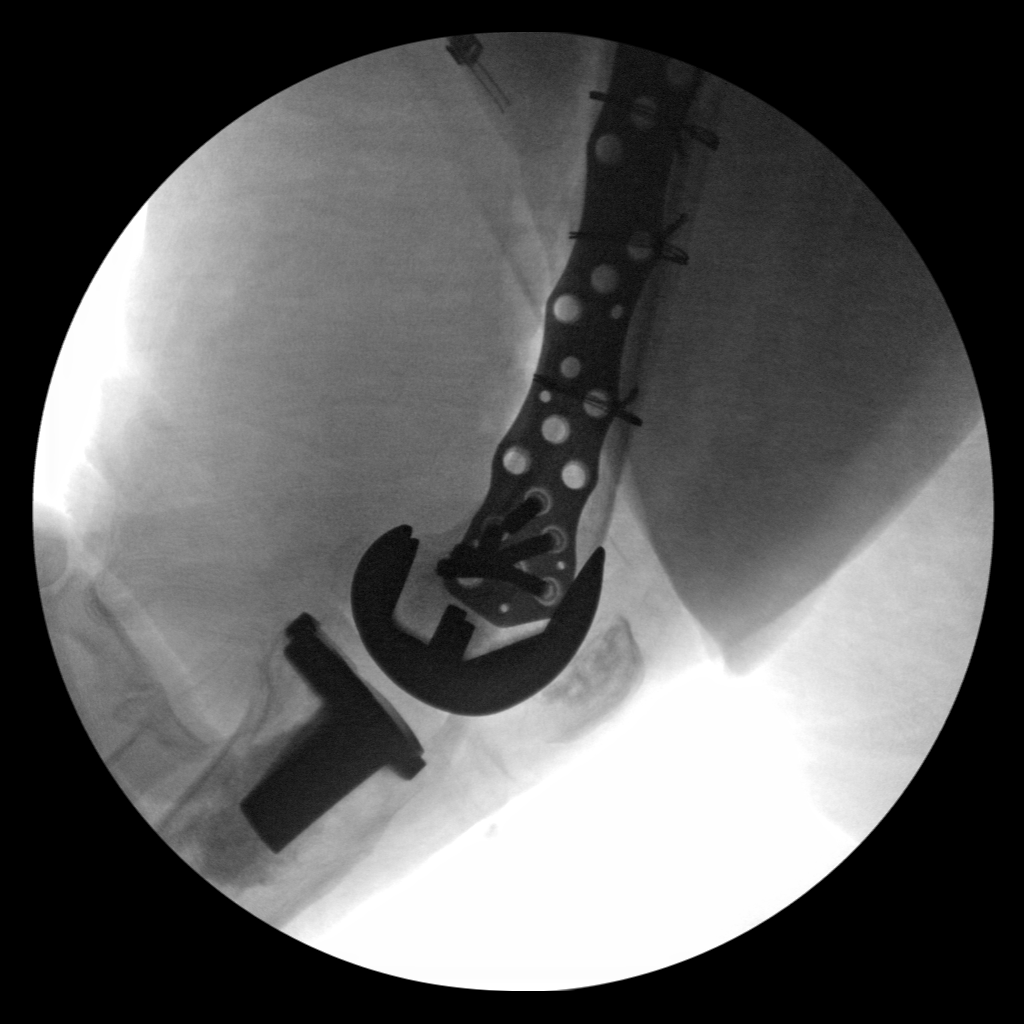

[5 of 5 positions shown; findings below may reference images not displayed]

FINDINGS: Intraoperative images from sideplate and screw fixation of distal
femoral fracture demonstrate placement of hardware with improved
alignment at the fracture line.

Expected soft tissue swelling.
IMPRESSION: Intraoperative images from sideplate and screw fixation of distal
femoral fracture with improved alignment at the fracture line.

## 2019-11-07 IMAGING — RF DG FEMUR 2+V*R*
1 series · 5 of 5 positions shown · non-contrast
Comparison: July 25, 2019

CLINICAL DATA: Right femur fixation.

EXAM:
RIGHT FEMUR 2 VIEWS

[Series 1: run · 5 of 5 slices shown]
[im 1/5]
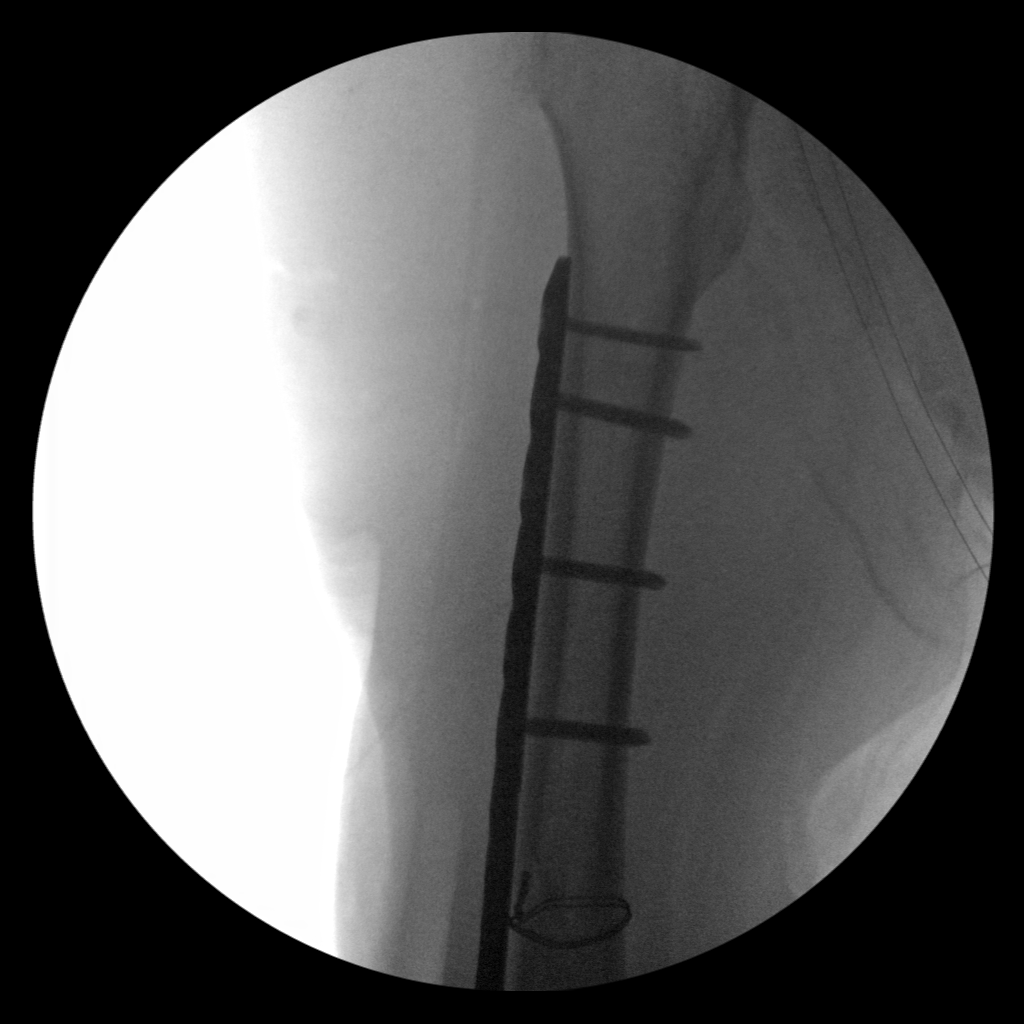
[im 2/5]
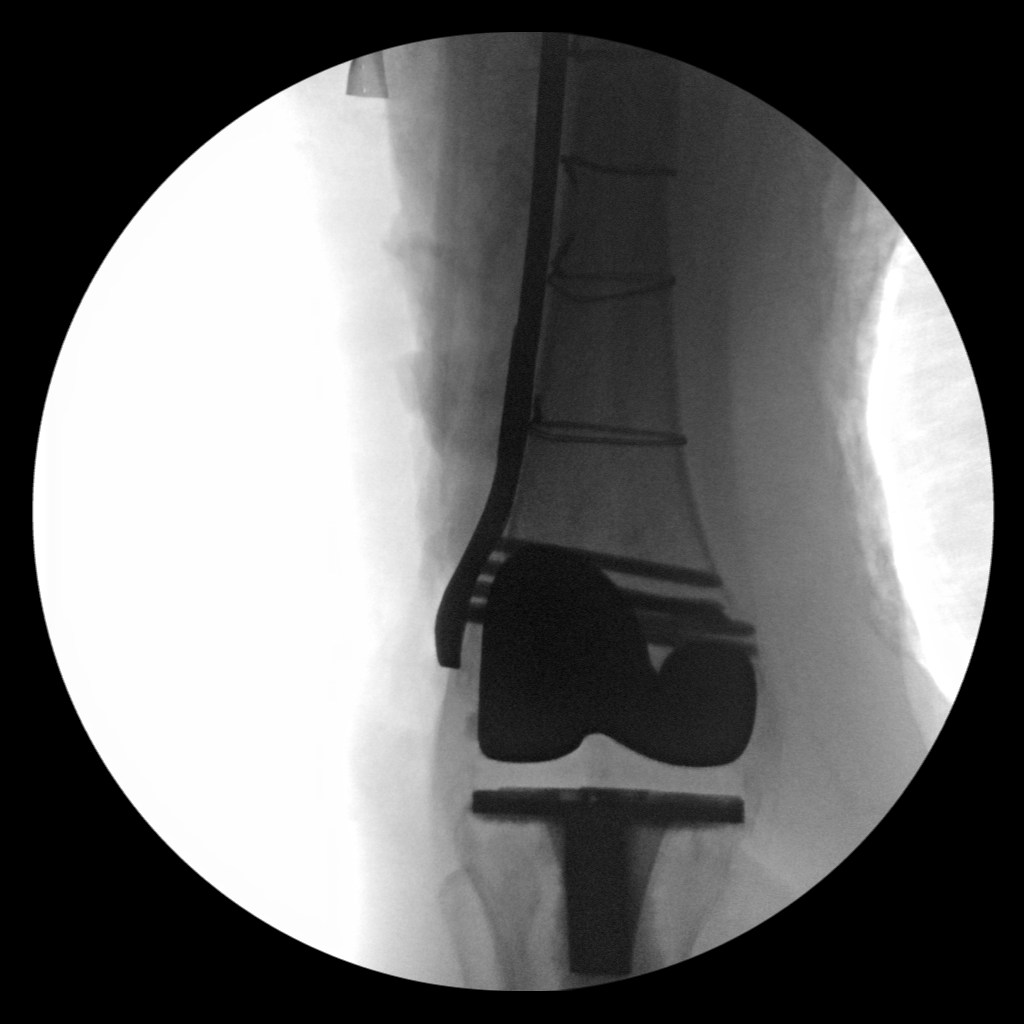
[im 3/5]
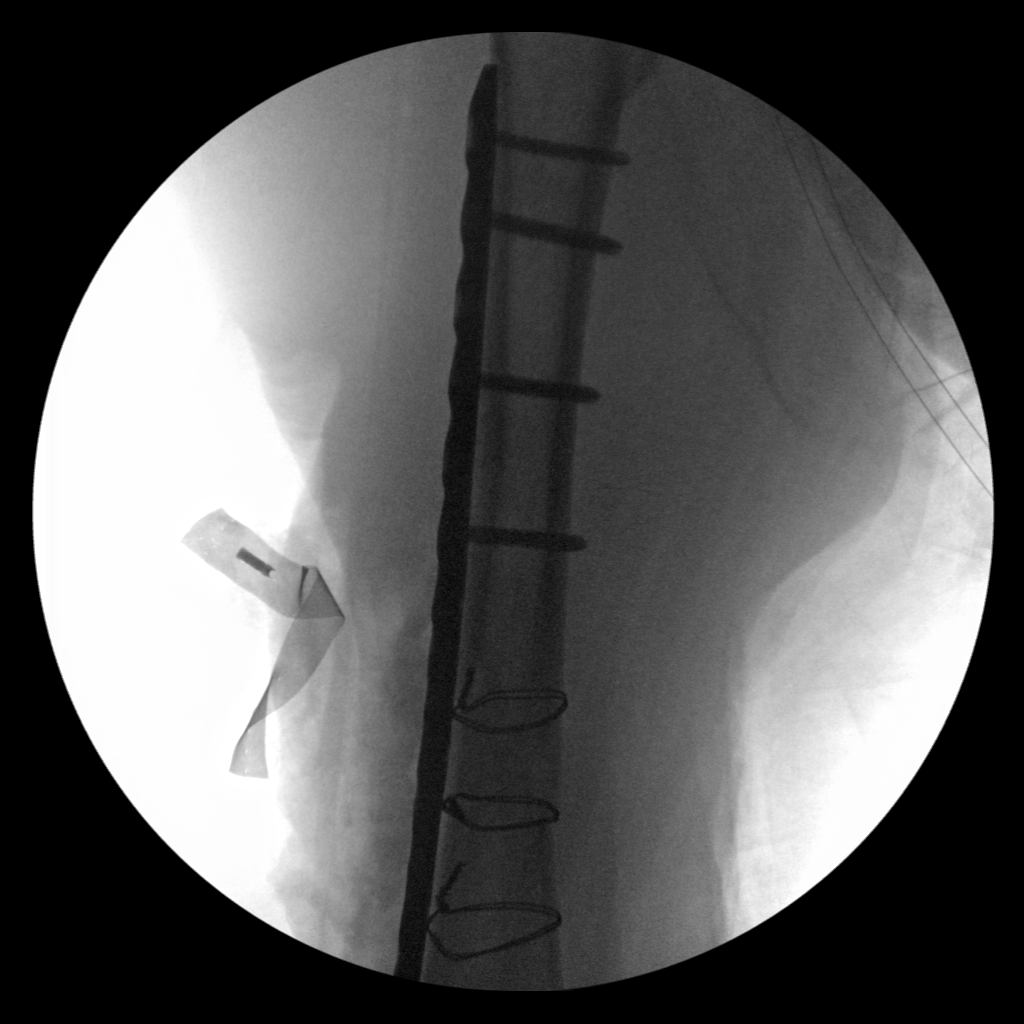
[im 4/5]
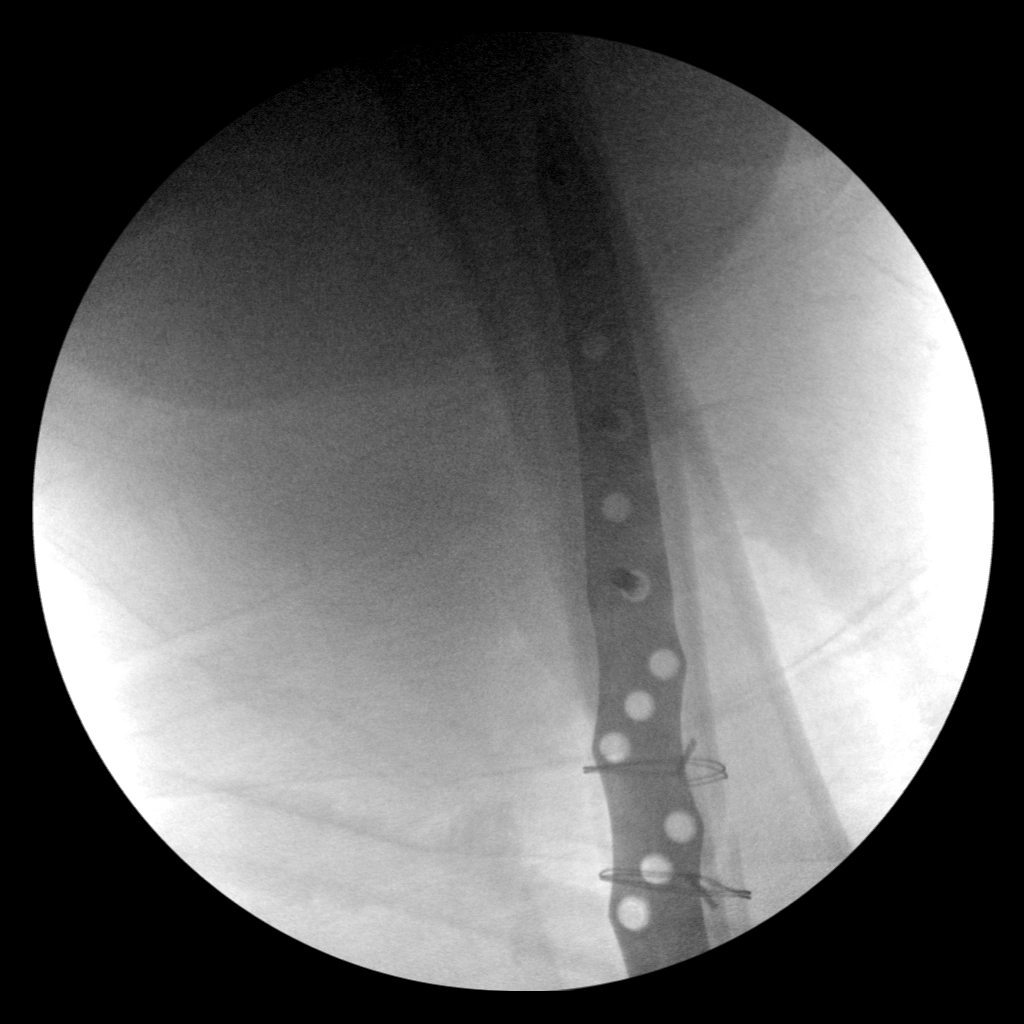
[im 5/5]
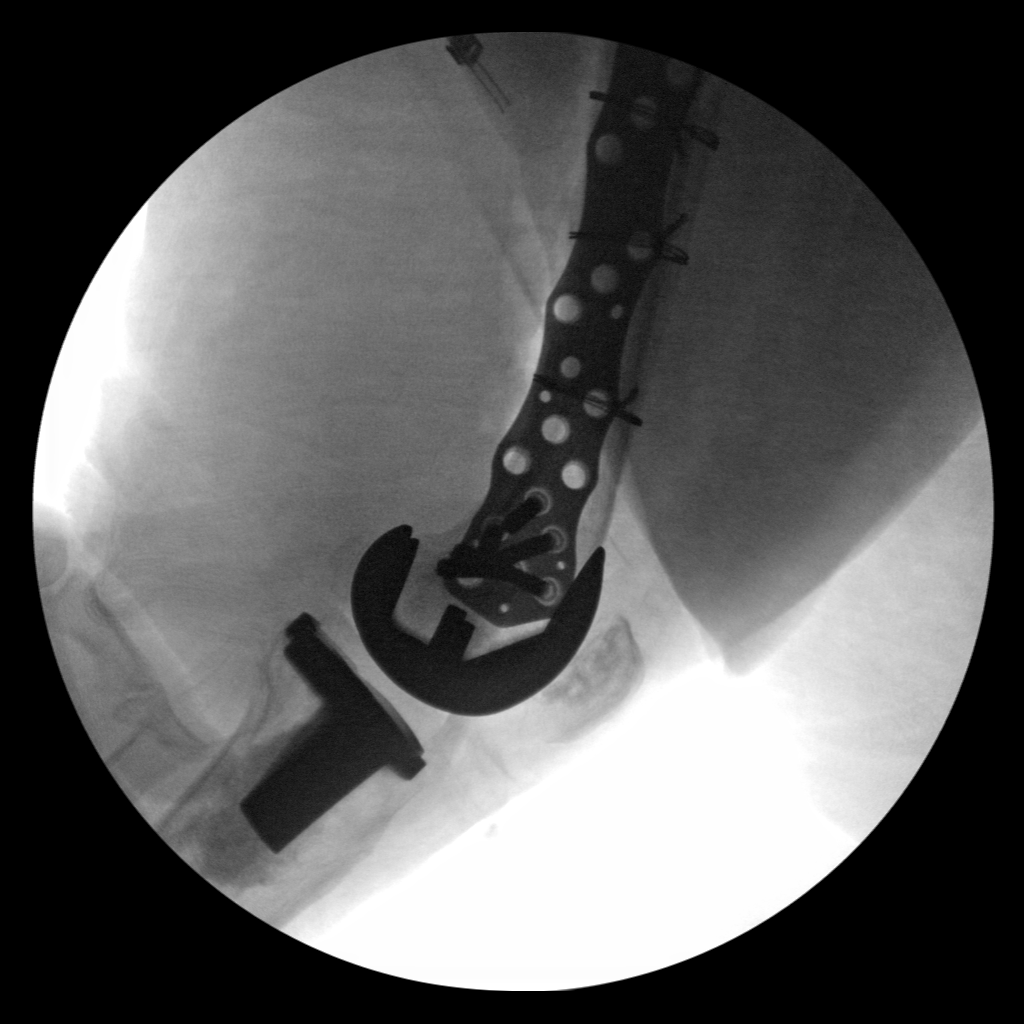

[5 of 5 positions shown; findings below may reference images not displayed]

FINDINGS: Intraoperative fluoroscopic images from sideplate and screw fixation
of distal femoral fracture demonstrate placement of hardware and
improved alignment at the fracture line. Intact right knee
arthroplasty is noted.

Fluoroscopy time is recorded as 2 minutes 20 seconds.
IMPRESSION: Intraoperative fluoroscopic images from sideplate and screw fixation
of distal femoral fracture.

## 2019-11-10 ENCOUNTER — Ambulatory Visit: Payer: Medicare Other | Admitting: Internal Medicine

## 2019-11-12 ENCOUNTER — Other Ambulatory Visit: Payer: Self-pay | Admitting: *Deleted

## 2019-11-12 DIAGNOSIS — Z8781 Personal history of (healed) traumatic fracture: Secondary | ICD-10-CM

## 2019-11-12 MED ORDER — OXYCODONE-ACETAMINOPHEN 5-325 MG PO TABS: 1.0000 | ORAL_TABLET | ORAL | 0 refills | Status: DC | PRN

## 2019-11-12 NOTE — Telephone Encounter (Signed)
Patient caregiver requested refill. [place] Verified  LR: 09/30/2019 Pended Rx and sent to Dr. Mariea Clonts for approval.   Appointment 11/28/2019 to update Narcotic Contract.

## 2019-11-25 ENCOUNTER — Encounter: Payer: Self-pay | Admitting: Family

## 2019-11-25 ENCOUNTER — Encounter: Payer: Medicare Other | Admitting: Family

## 2019-11-25 ENCOUNTER — Other Ambulatory Visit: Payer: Self-pay

## 2019-11-25 ENCOUNTER — Ambulatory Visit (INDEPENDENT_AMBULATORY_CARE_PROVIDER_SITE_OTHER): Payer: Medicare Other | Admitting: Family

## 2019-11-25 DIAGNOSIS — R197 Diarrhea, unspecified: Secondary | ICD-10-CM | POA: Diagnosis not present

## 2019-11-25 NOTE — Progress Notes (Signed)
This service is provided via telemedicine  No vital signs collected/recorded due to the encounter was a telemedicine visit.   Location of patient (ex: home, work):  Home  Patient consents to a telephone visit:  Yes  Location of the provider (ex: office, home):  Office   Name of any referring provider:  [place] of all persons participating in the telemedicine service and their role in the encounter:  Marlowe Sax, NP, Ruthell Rummage CMA, patient and caregiver [name]   Time spent on call: Ruthell Rummage CMA spent 8 minutes on phone with patient   Provider: Zaydin Billey FNP-C  Gayland Curry, DO  Patient Care Team: Gayland Curry, DO as PCP - General (Geriatric Medicine) Tanda Rockers, MD as Consulting Physician (Pulmonary Disease) Deveron Furlong, NP as Nurse Practitioner (Psychiatry)  Extended Emergency Contact Information Primary Emergency Contact: Devore,Roberta(Bobbi) Address: [address]          [place], [place] [address] [name] Phone: [phone] Mobile Phone: [phone] Relation: Daughter Secondary Emergency Contact: Maxham,David Address: [address]           [place], [place] [address] [place] Phone: [phone] Work Phone: [phone] Relation: Son  Code Status:  DNR Goals of care: Advanced Directive information Advanced Directives 11/25/2019  Does Patient Have a Medical Advance Directive? Yes  Type of Advance Directive Out of facility DNR (pink MOST or yellow form)  Does patient want to make changes to medical advance directive? No - Patient declined  Copy of [place] in Chart? -  Would patient like information on creating a medical advance directive? -  Pre-existing out of facility DNR order (yellow form or pink MOST form) Yellow form placed in chart (order not valid for inpatient use)     Chief Complaint  Patient presents with  . Acute Visit    Patient's  caregiver [name] states patient has symptoms of Covid  fever, chills and diarrhea since last thursday. Daughter would like to know if patient needs to get tested.     HPI:  Pt is a 85 y.o. female seen today for an acute visit for evaluation of diarrhea x 5 days.Patient's daughter states patient had pumpkin pie on Thursday 11/19/2019 wonders whether the pie was bad.After eating the pie on Thursday patient developed diarrhea early morning on Friday 11/20/2019 and has diarrhea over th weekend.Last night 10/23/2020 patient started running a fever 103 - 104 daughter gave her tylenol,applied ice,wet compressor and essential oil with much improvement.Temp went down.she has had small amount of diarrhea today.No fever today.she was given imodium. She denies any exposure to person with COVID-19.also denies any cough,runny nose,loss of taste/smell or shortness of breath.   Past Medical History:  Diagnosis Date  . A-fib ([place])   . Anemia   . Anxiety   . Arthritis    hands, spine   . Bladder incontinence   . Blood transfusion   . Cancer ([place]) 02/2019   left breast cancer  . Cervicalgia   . Constipation   . Cough    Wert-onset 08/2009, as of 2014- resolved   . Deep venous thrombosis ([place])    post op, rec'd/needed  blood thinner   . Depression   . Depression with anxiety   . Dysrhythmia    a-fib  . Falls frequently    pt. reports that she was falling 3 times a day, has had PT at a facility  for a while & now is getting home PT 2-3 times/ week   . Headache(784.0) 09/27/2011  . History of stress test    15-20 yrs.ago   . Hypotension   . Hypothyroidism   . Lumbar radiculopathy 09/26/2011  . Pneumonia    never been in hosp. for pneumonia   . Prediabetes   . Right hand fracture    Monday  . Shortness of breath   . Sleep apnea    does not use consistently   Past Surgical History:  Procedure Laterality Date  . ANTERIOR CERVICAL DECOMP/DISCECTOMY FUSION N/A 07/09/2013   Procedure: ANTERIOR CERVICAL  DECOMPRESSION/DISCECTOMY FUSION 3 LEVELS;  Surgeon: Sinclair Ship, MD;  Location: [facility];  Service: Orthopedics;  Laterality: N/A;  Anterior cervical decompression fusion, cervical 3-4, cervical 4-5-, cervical 5-6 with instrumentation, allograft.  . APPENDECTOMY    . BACK SURGERY     2000  . BREAST EXCISIONAL BIOPSY     breast bxs on left x 2, breast bx of right x 1-all benign  . EYE SURGERY     cataracts bilateral /w IOL  . HERNIA REPAIR     umbilical hernia- [id number]  . IR IMAGING GUIDED PORT INSERTION  04/28/2019  . JOINT REPLACEMENT  2006,2007   bilateral  . LAPAROSCOPIC OVARIAN CYSTECTOMY     not done by laparscopy-abdominal incision  . ORIF PERIPROSTHETIC FRACTURE Right 07/26/2019   Procedure: OPEN REDUCTION INTERNAL FIXATION (ORIF) RIGHT FEMUR PERIPROSTHETIC FRACTURE;  Surgeon: Rod Can, MD;  Location: [facility];  Service: Orthopedics;  Laterality: Right;  . PORTACATH PLACEMENT Right 04/01/2019   Procedure: ATTEMPTED INSERTION PORT-A-CATH WITH ULTRASOUND;  Surgeon: [name], MD;  Location: [facility];  Service: General;  Laterality: Right;  . RADIOACTIVE SEED GUIDED PARTIAL MASTECTOMY WITH AXILLARY SENTINEL LYMPH NODE BIOPSY Left 04/01/2019   Procedure: LEFT BREAST PARTIAL MASTECTOMY WITH RADIOACTIVE SEED AND LEFT SENTINEL LYMPH NODE BIOPSY;  Surgeon: [name], MD;  Location: [facility];  Service: General;  Laterality: Left;  . RE-EXCISION OF BREAST CANCER,SUPERIOR MARGINS Left 04/15/2019   Procedure: RE-EXCISION OF LEFT BREAST CANCER POSITIVE MARGINS;  Surgeon: [name], MD;  Location: [facility];  Service: General;  Laterality: Left;  . REPLACEMENT TOTAL KNEE BILATERAL    . TONSILLECTOMY      Allergies  Allergen Reactions  . Clindamycin Hcl Other (See Comments)    REACTION: swelling, pt. Reports that she had a 16 lb. weightgain in one day    Outpatient Encounter Medications as of 11/25/2019  Medication Sig   . Brexpiprazole 1 MG TABS Take 1 mg by mouth at bedtime.   . busPIRone (BUSPAR) 5 MG tablet Take 5-10 mg by mouth 2 (two) times daily. 10 MG IN THE MORNING, 5 MG AT DINNER  . CALCIUM CITRATE PO Take 600 mg by mouth daily.   . clonazePAM (KLONOPIN) 0.5 MG tablet Take 1 tablet by mouth as needed in middle of the nite to go back to sleep  . CRANBERRY PO Take 2 tablets by mouth daily.  . DULoxetine (CYMBALTA) 60 MG capsule Take 1 capsule (60 mg total) by mouth daily.  [name] ELIQUIS 5 MG TABS tablet TAKE 1 TABLET BY MOUTH TWICE A DAY  . levothyroxine (SYNTHROID) 50 MCG tablet TAKE ONE TABLET BY MOUTH ONCE DAILY 30 MINUTES BEFORE BREAKFAST FOR THYROID  . lidocaine (LMX) 4 % cream Apply 1 application topically as needed (port).  . mirtazapine (REMERON) 15 MG tablet Take 7.5 mg by  mouth at bedtime.  . Multiple Vitamins-Minerals (MULTIVITAMIN PO) Take 1 tablet by mouth daily.   [name] oxybutynin (DITROPAN-XL) 10 MG 24 hr tablet Take 10 mg by mouth daily.  [name] oxyCODONE-acetaminophen (PERCOCET) 5-325 MG tablet Take 1 tablet by mouth every 4 (four) hours as needed for severe pain.  [name] senna-docusate (SENOKOT-S) 8.6-50 MG tablet Take 1 tablet by mouth 2 (two) times daily as needed for mild constipation.  . traZODone (DESYREL) 100 MG tablet Take 100 mg by mouth at bedtime.  [name] UNABLE TO FIND Med Name: Home Health Nurse to Obtain urine specimen for Urine analysis,culture and Sensitivity  . vitamin B-12 (CYANOCOBALAMIN) 500 MCG tablet Take 500 mcg by mouth daily.   No facility-administered encounter medications on file as of 11/25/2019.    Review of Systems  Constitutional: Negative for chills, fatigue and fever.  HENT: Negative for congestion, rhinorrhea, sinus pressure, sinus pain, sneezing, sore throat and trouble swallowing.   Eyes: Positive for visual disturbance. Negative for discharge, redness and itching.       Wears eye glasses  Respiratory: Negative for cough, chest tightness, shortness of breath and  wheezing.   Cardiovascular: Negative for chest pain, palpitations and leg swelling.  Gastrointestinal: Negative for abdominal distention, abdominal pain, constipation, nausea and vomiting.       Diarrhea   Genitourinary: Negative for difficulty urinating, dysuria, flank pain, frequency and urgency.  Musculoskeletal: Positive for arthralgias and gait problem. Negative for myalgias.  Skin: Negative for color change, pallor and rash.  Neurological: Negative for dizziness, light-headedness and headaches.  Psychiatric/Behavioral: Negative for agitation, behavioral problems and sleep disturbance. The patient is not nervous/anxious.     Immunization History  Administered Date(s) Administered  . Influenza Split 08/14/2013  . Influenza Whole 08/20/2009  . Influenza, High Dose Seasonal PF 09/11/2017, 07/25/2018  . Influenza,inj,Quad PF,6+ Mos 08/04/2014, 09/06/2015, 07/10/2016  . Pneumococcal Conjugate-13 01/14/2015  . Pneumococcal Polysaccharide-23 07/11/2013  . Td 11/20/2005, 11/27/2016   Pertinent  Health Maintenance Due  Topic Date Due  . OPHTHALMOLOGY EXAM  01/02/2019  . FOOT EXAM  02/15/2019  . HEMOGLOBIN A1C  04/16/2019  . INFLUENZA VACCINE  06/21/2019  . URINE MICROALBUMIN  10/22/2019  . DEXA SCAN  Completed  . PNA vac Low Risk Adult  Completed   Fall Risk  11/25/2019 10/01/2019 08/28/2019 07/03/2019 02/27/2019  Falls in the past year? 0 1 1 1  0  Number falls in past yr: 0 1 1 1  0  Comment - - - - -  Injury with Fall? 0 1 1 0 0  Comment - - Broke right leg - -  Risk for fall due to : - - History of fall(s);Impaired balance/gait - -  Follow up - - - - -   There were no vitals filed for this visit. There is no height or weight on file to calculate BMI. Physical Exam Unable to complete on telephone visit.   Labs reviewed: Recent Labs    07/31/19 0425 08/02/19 0537 08/03/19 0441 08/05/19 1144 08/06/19 0334 08/21/19 0000  NA 132* 134* 133* 135 138 139  K 3.8 3.5 4.1 3.6 3.5  4.4  CL 99 103 102 101 102  --   CO2 23 22 21* 23 25  --   GLUCOSE 125* 111* 114* 109* 107*  --   BUN 24* 24* 21 13 14 16   CREATININE 0.71 0.71 0.64 0.64 0.65 0.9  CALCIUM 8.2* 8.1* 8.3* 8.6* 8.7*  --   MG 1.9 1.7 1.8 1.7  --   --  PHOS 2.5  --  2.7  --   --   --    Recent Labs    07/30/19 0428 07/31/19 0425 08/05/19 1144  AST 28 24 22   ALT 28 26 18   ALKPHOS 51 56 57  BILITOT 1.2 0.8 0.9  PROT 5.3* 5.4* 5.2*  ALBUMIN 2.8* 2.8* 2.6*   Recent Labs    08/04/19 0407 08/05/19 1144 08/06/19 0334 08/21/19 0000  WBC 8.7 9.2 9.1 6.1  NEUTROABS 6.3 7.6 6.8  --   HGB 8.7* 8.8* 8.9* 11.0*  HCT 26.4* 25.8* 26.4* 35*  MCV 92.6 91.5 93.6  --   PLT 303 313 307 219   Lab Results  Component Value Date   TSH 1.414 07/30/2019   Lab Results  Component Value Date   HGBA1C 5.1 10/16/2018   Lab Results  Component Value Date   CHOL 172 10/16/2018   HDL 56 10/16/2018   LDLCALC 97 10/16/2018   TRIG 98 10/16/2018   CHOLHDL 3.1 10/16/2018    Significant Diagnostic Results in last 30 days:  No results found.  Assessment/Plan   Diarrhea, unspecified type Had fever but none today.diarrhea has decreased with use of imodium. - encouraged to increased fluid intake,bland diet for 24 hrs then advance as tolerated. - Notify provider or go to ED if fever persist or diarrhea worsen. - continue on Imodium as needed for diarrhea.   Will order lab work or send for COVID-19 testing if diarrhea persist or symptoms worsen.   Family/ staff Communication: Reviewed plan of care with patient, daughter and care giver   Labs/tests ordered: None   Spent 13 minutes of non-face to face with patient    Sandrea Hughs, NP

## 2019-11-25 NOTE — Patient Instructions (Signed)
-   Increase fluid intake - continue on imodium as needed for diarrhea and notify provider or go to ED  if fever > 100 or symptoms worsen    Bland Diet A bland diet consists of foods that are often soft and do not have a lot of fat, fiber, or extra seasonings. Foods without fat, fiber, or seasoning are easier for the body to digest. They are also less likely to irritate your mouth, throat, stomach, and other parts of your digestive system. A bland diet is sometimes called a BRAT diet. What is my plan? Your health care provider or food and nutrition specialist (dietitian) may recommend specific changes to your diet to prevent symptoms or to treat your symptoms. These changes may include:  Eating small meals often.  Cooking food until it is soft enough to chew easily.  Chewing your food well.  Drinking fluids slowly.  Not eating foods that are very spicy, sour, or fatty.  Not eating citrus fruits, such as oranges and grapefruit. What do I need to know about this diet?  Eat a variety of foods from the bland diet food list.  Do not follow a bland diet longer than needed.  Ask your health care provider whether you should take vitamins or supplements. What foods can I eat? Grains  Hot cereals, such as cream of wheat. Rice. Bread, crackers, or tortillas made from refined white flour. Vegetables Canned or cooked vegetables. Mashed or boiled potatoes. Fruits  Bananas. Applesauce. Other types of cooked or canned fruit with the skin and seeds removed, such as canned peaches or pears. Meats and other proteins  Scrambled eggs. Creamy peanut butter or other nut butters. Lean, well-cooked meats, such as chicken or fish. Tofu. Soups or broths. Dairy Low-fat dairy products, such as milk, cottage cheese, or yogurt. Beverages  Water. Herbal tea. Apple juice. Fats and oils Mild salad dressings. Canola or olive oil. Sweets and desserts Pudding. Custard. Fruit gelatin. Ice cream. The items  listed above may not be a complete list of recommended foods and beverages. Contact a dietitian for more options. What foods are not recommended? Grains Whole grain breads and cereals. Vegetables Raw vegetables. Fruits Raw fruits, especially citrus, berries, or dried fruits. Dairy Whole fat dairy foods. Beverages Caffeinated drinks. Alcohol. Seasonings and condiments Strongly flavored seasonings or condiments. Hot sauce. Salsa. Other foods Spicy foods. Fried foods. Sour foods, such as pickled or fermented foods. Foods with high sugar content. Foods high in fiber. The items listed above may not be a complete list of foods and beverages to avoid. Contact a dietitian for more information. Summary  A bland diet consists of foods that are often soft and do not have a lot of fat, fiber, or extra seasonings.  Foods without fat, fiber, or seasoning are easier for the body to digest.  Check with your health care provider to see how long you should follow this diet plan. It is not meant to be followed for long periods. This information is not intended to replace advice given to you by your health care provider. Make sure you discuss any questions you have with your health care provider. Document Revised: 12/05/2017 Document Reviewed: 12/05/2017 Elsevier Patient Education  2020 [employer].

## 2019-11-26 ENCOUNTER — Other Ambulatory Visit: Payer: Self-pay

## 2019-11-26 ENCOUNTER — Telehealth: Payer: Self-pay | Admitting: Internal Medicine

## 2019-11-26 ENCOUNTER — Ambulatory Visit (INDEPENDENT_AMBULATORY_CARE_PROVIDER_SITE_OTHER): Payer: Medicare Other | Admitting: Family

## 2019-11-26 ENCOUNTER — Encounter: Payer: Self-pay | Admitting: Family

## 2019-11-26 ENCOUNTER — Ambulatory Visit: Payer: Self-pay | Admitting: Family

## 2019-11-26 VITALS — BP 103/47 | HR 76 | Temp 98.6°F

## 2019-11-26 DIAGNOSIS — Z1152 Encounter for screening for COVID-19: Secondary | ICD-10-CM

## 2019-11-26 DIAGNOSIS — R197 Diarrhea, unspecified: Secondary | ICD-10-CM | POA: Diagnosis not present

## 2019-11-26 NOTE — Progress Notes (Signed)
This service is provided via telemedicine  No vital signs collected/recorded due to the encounter was a telemedicine visit.   Location of patient (ex: home, work):  home  Patient consents to a telephone visit:  Yes  Location of the provider (ex: office, home):  Office   Name of any referring provider:  Hollace Kinnier, [place] of all persons participating in the telemedicine service and their role in the encounter:  Marlowe Sax, NP, Ruthell Rummage CMA, and patient   Time spent on call:  Ruthell Rummage CMA spent  4 minutes on phone with patient    Provider: Robi Dewolfe FNP-C  Gayland Curry, DO  Patient Care Team: Gayland Curry, DO as PCP - General (Geriatric Medicine) Tanda Rockers, MD as Consulting Physician (Pulmonary Disease) Deveron Furlong, NP as Nurse Practitioner (Psychiatry)  Extended Emergency Contact Information Primary Emergency Contact: Popelka,Roberta(Bobbi) Address: [address]          [place], [place] [address] [name] Phone: [phone] Mobile Phone: [phone] Relation: Daughter Secondary Emergency Contact: Earlywine,David Address: [address]           [place], [place] [address] [place] Phone: [phone] Work Phone: [phone] Relation: Son  Code Status:  DNR Goals of care: Advanced Directive information Advanced Directives 11/25/2019  Does Patient Have a Medical Advance Directive? Yes  Type of Advance Directive Out of facility DNR (pink MOST or yellow form)  Does patient want to make changes to medical advance directive? No - Patient declined  Copy of [place] in Chart? -  Would patient like information on creating a medical advance directive? -  Pre-existing out of facility DNR order (yellow form or pink MOST form) Yellow form placed in chart (order not valid for inpatient use)     Chief Complaint  Patient presents with  . Acute Visit    Caregiver states patient is  still having issues with fever of 100, diarrhea, and still a lot weak. Would like to see if we can prescribe anything Imodium not working. Also would like to know if patient needs to be tested for COVID.     HPI:  Pt is a 85 y.o. female seen today for an acute visit for evaluation of diarrhea and fever.Caregiver states patient is still having issues with fever Temp was close to 100 overnight but down to 98.6 this morning.She states had diarrhea all over the floor this morning.Care giver states patient has a lot weakness.Almost fell last evening but care giver caught her with a gait belt.Care giver would like to see if we can prescribe anything Imodium not working.she states using imodium which expired in July,2020.Agrees to buy new bottle of imodium.Also would like to know if patient needs to be tested for COVID.states patient has not been in contact with anyone sick with COVId-19 but has two care givers one works with school.patient was also out one week ago working with Physical Therapy.she was able to eat well this morning had a bowl of oatmeal.Has increased her fluid intake.she denies any loss of taste,smell.chest tightness,body aches or shortness of breath.    Past Medical History:  Diagnosis Date  . A-fib ([place])   . Anemia   . Anxiety   . Arthritis    hands, spine   . Bladder incontinence   . Blood transfusion   . Cancer ([facility] [facility]) 02/2019   left breast cancer  . Cervicalgia   . Constipation   .  Cough    Wert-onset 08/2009, as of 2014- resolved   . Deep venous thrombosis ([facility])    post op, rec'd/needed  blood thinner   . Depression   . Depression with anxiety   . Dysrhythmia    a-fib  . Falls frequently    pt. reports that she was falling 3 times a day, has had PT at a facility for a while & now is getting home PT 2-3 times/ week   . Headache(784.0) 09/27/2011  . History of stress test    15-20 yrs.ago   . Hypotension   . Hypothyroidism   . Lumbar radiculopathy 09/26/2011  .  Pneumonia    never been in hosp. for pneumonia   . Prediabetes   . Right hand fracture    Monday  . Shortness of breath   . Sleep apnea    does not use consistently   Past Surgical History:  Procedure Laterality Date  . ANTERIOR CERVICAL DECOMP/DISCECTOMY FUSION N/A 07/09/2013   Procedure: ANTERIOR CERVICAL DECOMPRESSION/DISCECTOMY FUSION 3 LEVELS;  Surgeon: Sinclair Ship, MD;  Location: [facility];  Service: Orthopedics;  Laterality: N/A;  Anterior cervical decompression fusion, cervical 3-4, cervical 4-5-, cervical 5-6 with instrumentation, allograft.  . APPENDECTOMY    . BACK SURGERY     2000  . BREAST EXCISIONAL BIOPSY     breast bxs on left x 2, breast bx of right x 1-all benign  . EYE SURGERY     cataracts bilateral /w IOL  . HERNIA REPAIR     umbilical hernia- [id number]  . IR IMAGING GUIDED PORT INSERTION  04/28/2019  . JOINT REPLACEMENT  2006,2007   bilateral  . LAPAROSCOPIC OVARIAN CYSTECTOMY     not done by laparscopy-abdominal incision  . ORIF PERIPROSTHETIC FRACTURE Right 07/26/2019   Procedure: OPEN REDUCTION INTERNAL FIXATION (ORIF) RIGHT FEMUR PERIPROSTHETIC FRACTURE;  Surgeon: Rod Can, MD;  Location: [facility];  Service: Orthopedics;  Laterality: Right;  . PORTACATH PLACEMENT Right 04/01/2019   Procedure: ATTEMPTED INSERTION PORT-A-CATH WITH ULTRASOUND;  Surgeon: [name], MD;  Location: [facility];  Service: General;  Laterality: Right;  . RADIOACTIVE SEED GUIDED PARTIAL MASTECTOMY WITH AXILLARY SENTINEL LYMPH NODE BIOPSY Left 04/01/2019   Procedure: LEFT BREAST PARTIAL MASTECTOMY WITH RADIOACTIVE SEED AND LEFT SENTINEL LYMPH NODE BIOPSY;  Surgeon: [name], MD;  Location: [facility];  Service: General;  Laterality: Left;  . RE-EXCISION OF BREAST CANCER,SUPERIOR MARGINS Left 04/15/2019   Procedure: RE-EXCISION OF LEFT BREAST CANCER POSITIVE MARGINS;  Surgeon: [name], MD;  Location: [facility];   Service: General;  Laterality: Left;  . REPLACEMENT TOTAL KNEE BILATERAL    . TONSILLECTOMY      Allergies  Allergen Reactions  . Clindamycin Hcl Other (See Comments)    REACTION: swelling, pt. Reports that she had a 16 lb. weightgain in one day    Outpatient Encounter Medications as of 11/26/2019  Medication Sig  . Brexpiprazole 1 MG TABS Take 1 mg by mouth at bedtime.   . busPIRone (BUSPAR) 5 MG tablet Take 5-10 mg by mouth 2 (two) times daily. 10 MG IN THE MORNING, 5 MG AT DINNER  . CALCIUM CITRATE PO Take 600 mg by mouth daily.   . clonazePAM (KLONOPIN) 0.5 MG tablet Take 1 tablet by mouth as needed in middle of the nite to go back to sleep  . CRANBERRY PO Take 2 tablets by mouth daily.  . DULoxetine (CYMBALTA) 60 MG capsule Take 1  capsule (60 mg total) by mouth daily.  [name] ELIQUIS 5 MG TABS tablet TAKE 1 TABLET BY MOUTH TWICE A DAY  . levothyroxine (SYNTHROID) 50 MCG tablet TAKE ONE TABLET BY MOUTH ONCE DAILY 30 MINUTES BEFORE BREAKFAST FOR THYROID  . lidocaine (LMX) 4 % cream Apply 1 application topically as needed (port).  . mirtazapine (REMERON) 15 MG tablet Take 7.5 mg by mouth at bedtime.  . Multiple Vitamins-Minerals (MULTIVITAMIN PO) Take 1 tablet by mouth daily.   [name] oxybutynin (DITROPAN-XL) 10 MG 24 hr tablet Take 10 mg by mouth daily.  [name] oxyCODONE-acetaminophen (PERCOCET) 5-325 MG tablet Take 1 tablet by mouth every 4 (four) hours as needed for severe pain.  [name] senna-docusate (SENOKOT-S) 8.6-50 MG tablet Take 1 tablet by mouth 2 (two) times daily as needed for mild constipation.  . traZODone (DESYREL) 100 MG tablet Take 100 mg by mouth at bedtime.  [name] UNABLE TO FIND Med Name: Home Health Nurse to Obtain urine specimen for Urine analysis,culture and Sensitivity  . vitamin B-12 (CYANOCOBALAMIN) 500 MCG tablet Take 500 mcg by mouth daily.   No facility-administered encounter medications on file as of 11/26/2019.    Review of Systems  Constitutional: Negative for appetite change,  chills and fever.       Temp 98.6   HENT: Negative for congestion, rhinorrhea, sinus pressure, sinus pain, sneezing and sore throat.   Eyes: Negative for pain, discharge, redness and itching.  Respiratory: Negative for chest tightness, shortness of breath and wheezing.   Cardiovascular: Negative for chest pain, palpitations and leg swelling.  Gastrointestinal: Positive for diarrhea. Negative for abdominal distention, constipation, nausea and vomiting.       Lower abdomen cramping " feels like raking".  Genitourinary: Negative for difficulty urinating, dysuria, flank pain, frequency and urgency.  Musculoskeletal: Positive for gait problem.  Skin: Negative for color change, pallor and rash.  Neurological: Negative for dizziness, light-headedness and headaches.       Reports generalized weakness   Psychiatric/Behavioral: Negative for agitation, behavioral problems and sleep disturbance. The patient is not nervous/anxious.     Immunization History  Administered Date(s) Administered  . Influenza Split 08/14/2013  . Influenza Whole 08/20/2009  . Influenza, High Dose Seasonal PF 09/11/2017, 07/25/2018  . Influenza,inj,Quad PF,6+ Mos 08/04/2014, 09/06/2015, 07/10/2016  . Pneumococcal Conjugate-13 01/14/2015  . Pneumococcal Polysaccharide-23 07/11/2013  . Td 11/20/2005, 11/27/2016   Pertinent  Health Maintenance Due  Topic Date Due  . OPHTHALMOLOGY EXAM  01/02/2019  . FOOT EXAM  02/15/2019  . HEMOGLOBIN A1C  04/16/2019  . INFLUENZA VACCINE  06/21/2019  . URINE MICROALBUMIN  10/22/2019  . DEXA SCAN  Completed  . PNA vac Low Risk Adult  Completed   Fall Risk  11/26/2019 11/25/2019 10/01/2019 08/28/2019 07/03/2019  Falls in the past year? 1 0 1 1 1   Number falls in past yr: 0 0 1 1 1   Comment - - - - -  Injury with Fall? 0 0 1 1 0  Comment - - - Broke right leg -  Risk for fall due to : - - - History of fall(s);Impaired balance/gait -  Follow up - - - - -   There were no vitals filed for  this visit. There is no height or weight on file to calculate BMI. Physical Exam Unable to complete on telephone visit.   Labs reviewed: Recent Labs    07/31/19 0425 08/02/19 0537 08/03/19 0441 08/05/19 1144 08/06/19 0334 08/21/19 0000  NA 132* 134* 133* 135  138 139  K 3.8 3.5 4.1 3.6 3.5 4.4  CL 99 103 102 101 102  --   CO2 23 22 21* 23 25  --   GLUCOSE 125* 111* 114* 109* 107*  --   BUN 24* 24* 21 13 14 16   CREATININE 0.71 0.71 0.64 0.64 0.65 0.9  CALCIUM 8.2* 8.1* 8.3* 8.6* 8.7*  --   MG 1.9 1.7 1.8 1.7  --   --   PHOS 2.5  --  2.7  --   --   --    Recent Labs    07/30/19 0428 07/31/19 0425 08/05/19 1144  AST 28 24 22   ALT 28 26 18   ALKPHOS 51 56 57  BILITOT 1.2 0.8 0.9  PROT 5.3* 5.4* 5.2*  ALBUMIN 2.8* 2.8* 2.6*   Recent Labs    08/04/19 0407 08/05/19 1144 08/06/19 0334 08/21/19 0000  WBC 8.7 9.2 9.1 6.1  NEUTROABS 6.3 7.6 6.8  --   HGB 8.7* 8.8* 8.9* 11.0*  HCT 26.4* 25.8* 26.4* 35*  MCV 92.6 91.5 93.6  --   PLT 303 313 307 219   Lab Results  Component Value Date   TSH 1.414 07/30/2019   Lab Results  Component Value Date   HGBA1C 5.1 10/16/2018   Lab Results  Component Value Date   CHOL 172 10/16/2018   HDL 56 10/16/2018   LDLCALC 97 10/16/2018   TRIG 98 10/16/2018   CHOLHDL 3.1 10/16/2018    Significant Diagnostic Results in last 30 days:  No results found.  Assessment/Plan 1. Diarrhea, unspecified type Had temp 100 overnight but Temp down to 98.6 today.care giver states imodium bottle expired 05/2019.Advised to obtain another imodium.will rule out COVID-19.Recommended to go to ED but care giver would like to be tested for COVID-19.   - Novel Coronavirus, NAA ([facility]); Future  2. Encounter for screening for COVID-19 Unclear if exposed to COVID-19.continue to wear facial mask,social distance and hand hygiene per CDC guidelines.   - Novel Coronavirus, NAA ([facility]); Future  3. Generalized weakness  Suspected due to diarrhea.Advised  to go to ED but would like to be tested for COVID-19.Encouraged to go to ED if symptoms worsen.   Family/ staff Communication: Reviewed plan of care with patient and care giver.   Labs/tests ordered: - Novel Coronavirus, NAA ([facility]); Future  Next appointment: as needed   Spent 13 minutes of non-face to face with patient    Sandrea Hughs, NP

## 2019-11-26 NOTE — Telephone Encounter (Signed)
Order place of COVID-19 testing.Platient /daughter to book appointment through patient's [facility]

## 2019-11-26 NOTE — Telephone Encounter (Signed)
Called and instructed [name] on website to go to so they can sign up for an appointment and location. She verbalized understanding and states she will call back if she has questions.

## 2019-11-26 NOTE — Patient Instructions (Signed)
This information is directly available on the CDC website: https://www.cdc.gov/coronavirus/2019-ncov/if-you-are-sick/steps-when-sick.html    Source:CDC Reference to specific commercial products, manufacturers, companies, or trademarks does not constitute its endorsement or recommendation by the U.S. Government, Department of Health and Human Services, or Centers for Disease Control and Prevention.  

## 2019-11-26 NOTE — Telephone Encounter (Signed)
Patient's caregiver,  [name] called.  She stated patient had telehealth visit with Dinah Ngetich today and a covid test was to be set up in [place].  She has checked Mychart and doesn't see it scheduled.  Please call [name] regarding this.

## 2019-11-27 ENCOUNTER — Ambulatory Visit: Payer: Medicare Other | Attending: Internal Medicine

## 2019-11-27 DIAGNOSIS — Z20822 Contact with and (suspected) exposure to covid-19: Secondary | ICD-10-CM | POA: Diagnosis not present

## 2019-11-28 ENCOUNTER — Ambulatory Visit: Payer: Medicare Other | Admitting: Internal Medicine

## 2019-11-29 LAB — NOVEL CORONAVIRUS, NAA: SARS-CoV-2, NAA: NOT DETECTED

## 2019-12-01 ENCOUNTER — Telehealth: Payer: Self-pay | Admitting: *Deleted

## 2019-12-01 DIAGNOSIS — R509 Fever, unspecified: Secondary | ICD-10-CM | POA: Diagnosis not present

## 2019-12-01 DIAGNOSIS — R350 Frequency of micturition: Secondary | ICD-10-CM

## 2019-12-01 NOTE — Telephone Encounter (Signed)
Please call back and ask about all of the covid symptoms and UTI symptoms.  I'm concerned she does have covid even though she tested negative.  She should be following 14 day quarantine from onset of symptoms.  If she's having actual UTI symptoms, they can bring in a clean catch urine sample for UA c+s (send off).

## 2019-12-01 NOTE — Telephone Encounter (Signed)
Pt has an appointment on Thursday and would like to bring in a sample of urine to be tested to get patient on a antibiotic before the appointment. Daughter states pt been sick a week, also with a fever twice daily that they are controlling by tylenol. Pt also had diarrhea for several days.Saturday and Sunday pt woke up with a 101.0 fever. I did explain with the fever we can not bring pt in due to covid symptoms, pt did test negative 4 days ago.

## 2019-12-01 NOTE — Telephone Encounter (Signed)
Left message on VM with results, urine order and culture put in.

## 2019-12-01 NOTE — Telephone Encounter (Signed)
Left detailed message to have pt's daughter return my call.

## 2019-12-01 NOTE — Telephone Encounter (Signed)
Some of her confusion is related to a bit of cognitive impairment, also.  I'm concerned we may be testing her too often for UTIs and she will develop resistance.  Due to fevers, ok to provide clean catch urine sample.  Yes, appt thurs will need to be a video visit.

## 2019-12-01 NOTE — Telephone Encounter (Signed)
Daughter called back and stated pt has confusion which she thinks is a UTI. No other symptoms of Covid, per daughter the confusion is not new. She did have diarrhea earlier this week and that come and goes. I did tell her we would have to change the visit on Thursday to Video or telephone visit.

## 2019-12-02 NOTE — Telephone Encounter (Signed)
UA appears positive, but need culture to determine if antibiotic actually needed.

## 2019-12-03 LAB — URINALYSIS
Bilirubin Urine: NEGATIVE
Glucose, UA: NEGATIVE
Ketones, ur: NEGATIVE
Nitrite: NEGATIVE
Specific Gravity, Urine: 1.014 (ref 1.001–1.03)
pH: 6.5 (ref 5.0–8.0)

## 2019-12-03 LAB — URINE CULTURE
MICRO NUMBER:: 10030776
SPECIMEN QUALITY:: ADEQUATE

## 2019-12-03 MED ORDER — CIPROFLOXACIN HCL 500 MG PO TABS: 500.0000 mg | ORAL_TABLET | Freq: Every day | ORAL | 0 refills | Status: AC

## 2019-12-03 NOTE — Addendum Note (Signed)
Addended by: Gayland Curry on: 12/03/2019 05:05 PM   Modules accepted: Orders

## 2019-12-03 NOTE — Telephone Encounter (Signed)
Begin cipro 500mg  po daily for 7 days for klebsiella UTI

## 2019-12-04 ENCOUNTER — Encounter (INDEPENDENT_AMBULATORY_CARE_PROVIDER_SITE_OTHER): Payer: Self-pay | Admitting: Internal Medicine

## 2019-12-04 ENCOUNTER — Telehealth: Payer: Self-pay | Admitting: *Deleted

## 2019-12-04 ENCOUNTER — Other Ambulatory Visit: Payer: Self-pay

## 2019-12-04 NOTE — Telephone Encounter (Signed)
-----   Message from Gayland Curry, DO sent at 12/03/2019  5:04 PM EST ----- Begin cipro 500mg  po daily for 7 days for klebsiella UTI

## 2019-12-04 NOTE — Telephone Encounter (Signed)
rx sent to pharmacy by e-script  

## 2019-12-04 NOTE — Progress Notes (Signed)
error 

## 2019-12-05 NOTE — Progress Notes (Signed)
erroneous

## 2019-12-12 DIAGNOSIS — M9701XD Periprosthetic fracture around internal prosthetic right hip joint, subsequent encounter: Secondary | ICD-10-CM | POA: Diagnosis not present

## 2019-12-12 DIAGNOSIS — M7051 Other bursitis of knee, right knee: Secondary | ICD-10-CM | POA: Diagnosis not present

## 2019-12-16 ENCOUNTER — Telehealth: Payer: Self-pay

## 2019-12-16 ENCOUNTER — Telehealth (INDEPENDENT_AMBULATORY_CARE_PROVIDER_SITE_OTHER): Payer: Medicare Other | Admitting: Family

## 2019-12-16 ENCOUNTER — Other Ambulatory Visit: Payer: Self-pay

## 2019-12-16 DIAGNOSIS — R2681 Unsteadiness on feet: Secondary | ICD-10-CM

## 2019-12-16 DIAGNOSIS — R197 Diarrhea, unspecified: Secondary | ICD-10-CM

## 2019-12-16 DIAGNOSIS — R410 Disorientation, unspecified: Secondary | ICD-10-CM

## 2019-12-16 DIAGNOSIS — R531 Weakness: Secondary | ICD-10-CM | POA: Diagnosis not present

## 2019-12-16 NOTE — Patient Instructions (Addendum)
-   Encouraged fluid and oral intake. -  Take patient to ED if symptoms worsen or running a fever or chills. -  Home health urgent order place for PT to work with patient. Order placed for  [facility] PT for gait stability,exercise,ROM and muscle strengthening. -  [facility] HHRN to draw blood work CBC/diff and CMP to rule out infection and metabolic etiology and  urine specimen for U/A and C/S rule out UTI  -  Gastroenterology referral if diarrhea persist

## 2019-12-16 NOTE — Telephone Encounter (Signed)
Scheduled patient for video visit at 1:30 with [facility]

## 2019-12-16 NOTE — Progress Notes (Signed)
Patient ID: Andrea [name], female   DOB: Mar 25, 1935, 85 y.o.   MRN: [id number] This service is provided via telemedicine  No vital signs collected/recorded due to the encounter was a telemedicine visit.   Location of patient (ex: home, work):  HOME  Patient consents to a telephone visit:  YES  Location of the provider (ex: office, home):  OFFICE  Name of any referring provider:  TIFFANY REED, DO  Names of all persons participating in the telemedicine service and their role in the encounter:  PATIENT, [name], [name], [facility] Kasson Lamere, NP  Time spent on call:10 minutes   Provider: Antonia Culbertson FNP-C  Gayland Curry, DO  Patient Care Team: Gayland Curry, DO as PCP - General (Geriatric Medicine) Tanda Rockers, MD as Consulting Physician (Pulmonary Disease) Deveron Furlong, NP as Nurse Practitioner (Psychiatry)  Extended Emergency Contact Information Primary Emergency Contact: Calder,Roberta(Bobbi) Address: [address]          [place], [place] [address] [name] of [place] Phone: [phone] Mobile Phone: [phone] Relation: Daughter Secondary Emergency Contact: Basquez,David Address: [address]           [place], [place] [address] [place] Phone: [phone] Work Phone: [phone] Relation: Son  Code Status:  DNR Goals of care: Advanced Directive information Advanced Directives 12/04/2019  Does Patient Have a Medical Advance Directive? Yes  Type of Advance Directive Out of facility DNR (pink MOST or yellow form)  Does patient want to make changes to medical advance directive? No - Patient declined  Copy of [place] in Chart? -  Would patient like information on creating a medical advance directive? -  Pre-existing out of facility DNR order (yellow form or pink MOST form) Yellow form placed in chart (order not valid for inpatient use)     Chief Complaint  Patient presents with  . Acute Visit     POSSIBLE UTI    HPI:  Pt is a 85 y.o. female seen today for an acute visit for evaluation of possible urinary tract infection.Patient's daughter provided HPI information patient falling asleep on and off during visit.she had diarrhea 12/10/2019 but no fever.Daughter states took patient to Orthopedic surgeon on 12/12/2019 for evaluation of right knee and periprosthetic Fracture of the hip  states was told lab worker was normal except high platelets and inflammatory markers.No labs for review.States surgeon thought patient might still be having a UTI since she completed her oral Thursday 12/11/2019 Cipro .she was noted to be feeling better on Saturday 12/13/2019 had cheddar soup made her have diarrhea.she woke up at 4: 30 am with nausea and vomiting and very weak.Daughter had to call the brother to assist with putting patient back to bed.she slept whole day on Sunday.On Monday 12/15/2019 she was still weak and had diarrhea.took imodium.she required help with getting up slid to the floor and was assisted by the son and daughter.she feels much better today has had toast and jello.No diarrhea.  Daughter concerned since patient has on going diarrhea weakness and tends to be confused seeing things sometimes especially at night.she wonders whether patient still has a UTI and if we can treat with another round of antibiotics.she has had no fever,chills or symptoms of UTI though states usually present with weakness and confusion when she has UTI's. She's does not want to take her to the ED due to concerns for COVID-19 infections.she prefers to have HHN and physical therapy to work with her.Her COVID-19  test was negative on 11/27/2019.  She has worked with [facility] in the past would like referral to [facility].   Past Medical History:  Diagnosis Date  . A-fib ([place])   . Anemia   . Anxiety   . Arthritis    hands, spine   . Bladder incontinence   . Blood transfusion   . Cancer ([place]) 02/2019   left  breast cancer  . Cervicalgia   . Constipation   . Cough    Wert-onset 08/2009, as of 2014- resolved   . Deep venous thrombosis ([place])    post op, rec'd/needed  blood thinner   . Depression   . Depression with anxiety   . Dysrhythmia    a-fib  . Falls frequently    pt. reports that she was falling 3 times a day, has had PT at a facility for a while & now is getting home PT 2-3 times/ week   . Headache(784.0) 09/27/2011  . History of stress test    15-20 yrs.ago   . Hypotension   . Hypothyroidism   . Lumbar radiculopathy 09/26/2011  . Pneumonia    never been in hosp. for pneumonia   . Prediabetes   . Right hand fracture    Monday  . Shortness of breath   . Sleep apnea    does not use consistently   Past Surgical History:  Procedure Laterality Date  . ANTERIOR CERVICAL DECOMP/DISCECTOMY FUSION N/A 07/09/2013   Procedure: ANTERIOR CERVICAL DECOMPRESSION/DISCECTOMY FUSION 3 LEVELS;  Surgeon: Sinclair Ship, MD;  Location: [place];  Service: Orthopedics;  Laterality: N/A;  Anterior cervical decompression fusion, cervical 3-4, cervical 4-5-, cervical 5-6 with instrumentation, allograft.  . APPENDECTOMY    . BACK SURGERY     2000  . BREAST EXCISIONAL BIOPSY     breast bxs on left x 2, breast bx of right x 1-all benign  . EYE SURGERY     cataracts bilateral /w IOL  . HERNIA REPAIR     umbilical hernia- [id number]  . IR IMAGING GUIDED PORT INSERTION  04/28/2019  . JOINT REPLACEMENT  2006,2007   bilateral  . LAPAROSCOPIC OVARIAN CYSTECTOMY     not done by laparscopy-abdominal incision  . ORIF PERIPROSTHETIC FRACTURE Right 07/26/2019   Procedure: OPEN REDUCTION INTERNAL FIXATION (ORIF) RIGHT FEMUR PERIPROSTHETIC FRACTURE;  Surgeon: Rod Can, MD;  Location: [facility];  Service: Orthopedics;  Laterality: Right;  . PORTACATH PLACEMENT Right 04/01/2019   Procedure: ATTEMPTED INSERTION PORT-A-CATH WITH ULTRASOUND;  Surgeon: [name], MD;  Location: [facility];   Service: General;  Laterality: Right;  . RADIOACTIVE SEED GUIDED PARTIAL MASTECTOMY WITH AXILLARY SENTINEL LYMPH NODE BIOPSY Left 04/01/2019   Procedure: LEFT BREAST PARTIAL MASTECTOMY WITH RADIOACTIVE SEED AND LEFT SENTINEL LYMPH NODE BIOPSY;  Surgeon: [name], MD;  Location: [facility];  Service: General;  Laterality: Left;  . RE-EXCISION OF BREAST CANCER,SUPERIOR MARGINS Left 04/15/2019   Procedure: RE-EXCISION OF LEFT BREAST CANCER POSITIVE MARGINS;  Surgeon: [name], MD;  Location: [facility];  Service: General;  Laterality: Left;  . REPLACEMENT TOTAL KNEE BILATERAL    . TONSILLECTOMY      Allergies  Allergen Reactions  . Clindamycin Hcl Other (See Comments)    REACTION: swelling, pt. Reports that she had a 16 lb. weightgain in one day    Outpatient Encounter Medications as of 12/16/2019  Medication Sig  . Brexpiprazole 1 MG TABS Take 1 mg by mouth at  bedtime.   . busPIRone (BUSPAR) 5 MG tablet Take 5-10 mg by mouth 2 (two) times daily. 10 MG IN THE MORNING, 5 MG AT DINNER  . CALCIUM CITRATE PO Take 600 mg by mouth daily.   . clonazePAM (KLONOPIN) 0.5 MG tablet Take 1 tablet by mouth as needed in middle of the nite to go back to sleep  . CRANBERRY PO Take 2 tablets by mouth daily.  . DULoxetine (CYMBALTA) 60 MG capsule Take 1 capsule (60 mg total) by mouth daily.  [name] ELIQUIS 5 MG TABS tablet TAKE 1 TABLET BY MOUTH TWICE A DAY  . levothyroxine (SYNTHROID) 50 MCG tablet TAKE ONE TABLET BY MOUTH ONCE DAILY 30 MINUTES BEFORE BREAKFAST FOR THYROID  . lidocaine (LMX) 4 % cream Apply 1 application topically as needed (port).  . mirtazapine (REMERON) 15 MG tablet Take 7.5 mg by mouth at bedtime.  . Multiple Vitamins-Minerals (MULTIVITAMIN PO) Take 1 tablet by mouth daily.   [name] oxybutynin (DITROPAN-XL) 10 MG 24 hr tablet Take 10 mg by mouth daily.  [name] oxyCODONE-acetaminophen (PERCOCET) 5-325 MG tablet Take 1 tablet by mouth every 4 (four) hours as  needed for severe pain.  [name] senna-docusate (SENOKOT-S) 8.6-50 MG tablet Take 1 tablet by mouth 2 (two) times daily as needed for mild constipation.  . traZODone (DESYREL) 100 MG tablet Take 100 mg by mouth at bedtime.  . vitamin B-12 (CYANOCOBALAMIN) 500 MCG tablet Take 500 mcg by mouth daily.  . [DISCONTINUED] UNABLE TO FIND Med Name: [facility] Nurse to Obtain urine specimen for Urine analysis,culture and Sensitivity   No facility-administered encounter medications on file as of 12/16/2019.    Review of Systems  Constitutional: Positive for fatigue. Negative for appetite change, chills and fever.  HENT: Negative for congestion, sinus pressure, sinus pain, sneezing and sore throat.   Respiratory: Negative for cough, chest tightness, shortness of breath and wheezing.   Cardiovascular: Negative for chest pain, palpitations and leg swelling.  Gastrointestinal: Negative for abdominal distention, abdominal pain, constipation and vomiting.       Diarrhea and nausea per HPI   Genitourinary: Negative for difficulty urinating, dysuria, flank pain, frequency and urgency.  Musculoskeletal: Positive for gait problem.  Skin: Negative for color change, pallor and rash.  Neurological: Negative for dizziness, light-headedness and headaches.       Generalized weakness   Psychiatric/Behavioral: Negative for agitation and sleep disturbance. The patient is not nervous/anxious.        Confused per daughter    Immunization History  Administered Date(s) Administered  . Influenza Split 08/14/2013  . Influenza Whole 08/20/2009  . Influenza, High Dose Seasonal PF 09/11/2017, 07/25/2018  . Influenza,inj,Quad PF,6+ Mos 08/04/2014, 09/06/2015, 07/10/2016  . Pneumococcal Conjugate-13 01/14/2015  . Pneumococcal Polysaccharide-23 07/11/2013  . Td 11/20/2005, 11/27/2016   Pertinent  Health Maintenance Due  Topic Date Due  . OPHTHALMOLOGY EXAM  01/02/2019  . FOOT EXAM  02/15/2019  . HEMOGLOBIN A1C  04/16/2019   . INFLUENZA VACCINE  06/21/2019  . URINE MICROALBUMIN  10/22/2019  . DEXA SCAN  Completed  . PNA vac Low Risk Adult  Completed   Fall Risk  12/04/2019 11/26/2019 11/25/2019 10/01/2019 08/28/2019  Falls in the past year? 1 1 0 1 1  Number falls in past yr: 0 0 0 1 1  Comment - - - - -  Injury with Fall? 0 0 0 1 1  Comment - - - - Broke right leg  Risk for fall due to : - - - -  History of fall(s);Impaired balance/gait  Follow up - - - - -    There were no vitals filed for this visit. There is no height or weight on file to calculate BMI. Physical Exam Constitutional:      General: She is not in acute distress.    Appearance: She is not ill-appearing.  HENT:     Head: Normocephalic.  Eyes:     General: No scleral icterus.       Right eye: No discharge.        Left eye: No discharge.     Conjunctiva/sclera: Conjunctivae normal.  Pulmonary:     Effort: Pulmonary effort is normal. No respiratory distress.  Abdominal:     General: There is no distension.  Musculoskeletal:     Comments: Unsteady gait.  Neurological:     Mental Status: She is alert.     Gait: Gait abnormal.     Comments: Alert but falls asleep during visit but easily arosable.   Psychiatric:        Mood and Affect: Mood normal.        Speech: Speech normal.        Behavior: Behavior normal.        Thought Content: Thought content normal.        Judgment: Judgment normal.    Labs reviewed: Recent Labs    07/31/19 0425 08/01/19 0630 08/02/19 0537 08/02/19 0537 08/03/19 0441 08/03/19 0441 08/05/19 1144 08/06/19 0334 08/21/19 0000  NA 132*   < > 134*   < > 133*   < > 135 138 139  K 3.8   < > 3.5   < > 4.1   < > 3.6 3.5 4.4  CL 99   < > 103   < > 102  --  101 102  --   CO2 23   < > 22   < > 21*  --  23 25  --   GLUCOSE 125*   < > 111*   < > 114*  --  109* 107*  --   BUN 24*   < > 24*   < > 21   < > 13 14 16   CREATININE 0.71   < > 0.71   < > 0.64   < > 0.64 0.65 0.9  CALCIUM 8.2*   < > 8.1*   < > 8.3*   --  8.6* 8.7*  --   MG 1.9   < > 1.7  --  1.8  --  1.7  --   --   PHOS 2.5  --   --   --  2.7  --   --   --   --    < > = values in this interval not displayed.   Recent Labs    07/30/19 0428 07/31/19 0425 08/05/19 1144  AST 28 24 22   ALT 28 26 18   ALKPHOS 51 56 57  BILITOT 1.2 0.8 0.9  PROT 5.3* 5.4* 5.2*  ALBUMIN 2.8* 2.8* 2.6*   Recent Labs    08/04/19 0407 08/04/19 0407 08/05/19 1144 08/06/19 0334 08/21/19 0000  WBC 8.7   < > 9.2 9.1 6.1  NEUTROABS 6.3  --  7.6 6.8  --   HGB 8.7*   < > 8.8* 8.9* 11.0*  HCT 26.4*   < > 25.8* 26.4* 35*  MCV 92.6  --  91.5 93.6  --   PLT 303   < > 313 307 219   < > =  values in this interval not displayed.   Lab Results  Component Value Date   TSH 1.414 07/30/2019   Lab Results  Component Value Date   HGBA1C 5.1 10/16/2018   Lab Results  Component Value Date   CHOL 172 10/16/2018   HDL 56 10/16/2018   LDLCALC 97 10/16/2018   TRIG 98 10/16/2018   CHOLHDL 3.1 10/16/2018    Significant Diagnostic Results in last 30 days:  No results found.  Assessment/Plan 1. Generalized weakness Daughter reports no fever or chills.suspected possible due to her ongoing intermittent diarrhea.Recommended ED for further evaluation but Daughter declines due to concerns for COVID-19 infections exposure. Encouraged to increase fluid and oral intake.Advised to take patient to ED if symptoms worsen or running fever or chills.Request PT to work with patient.Has used [facility] in the past. Urgent Order placed for [facility] PT for gait stability,exercise,ROM and muscle strengthening.  - Ambulatory referral to [facility]  2. Unsteady gait Slid down while being assist ed with daughter no injuries.Home health Physical therapist as above. - Ambulatory referral to Home Health  3. Diarrhea, unspecified type Ongoing.continue to encourage hydration.continue on imodium.Recommend follow up with Gastroenterology daughter thinks cheddar soup caused diarrhea  though has had this on and off.    4. Confusion Per daughter has worsen seeing things suspect Urinary tract infection.Declines to send patient to ED for further evaluation.Prefer for [facility] to draw blood work and collect urine specimen at home.   - CBC/diff and CMP to rule out infection and metabolic etiology. [facility] Nurse to collect urine specimen for U/A and C/S rule out UTI  - Ambulatory referral to [facility] staff Communication: Reviewed plan of care with patient and daughter.   Labs/tests ordered:  CBC/diff,CMP, U/A and C/S   Next Appointment: as needed if symptoms worsen or not resolved.   Spent 15 minutes of face to face video with patient and daughter.   Sandrea Hughs, NP

## 2019-12-16 NOTE — Telephone Encounter (Signed)
Schedule for a video visit or telephone visit.

## 2019-12-16 NOTE — Telephone Encounter (Signed)
Incoming call received from [name], patients caregiver and she would like for Andrea [name] to be seen today by Webb Silversmith.  Patient still not thinking clear and diarrhea (last episode was last night).  Patient had a low-grade fever that resolved Sunday.  Patient was seen by another specialist and He recommended that she follow-up with her PCP's office to be re-evaluated for UTI and mentioned she may need another round of antibiotics. Patient recently completed Cipro 500 mg daily for 7 days.  Due to diarrhea being a covid symptom message sent to [facility] to approve or deny in office appointment.  Please advise

## 2019-12-17 ENCOUNTER — Encounter: Payer: Self-pay | Admitting: Family

## 2019-12-17 NOTE — Addendum Note (Signed)
Addended by: Despina Hidden on: 12/17/2019 08:35 AM   Modules accepted: Orders

## 2019-12-18 DIAGNOSIS — F419 Anxiety disorder, unspecified: Secondary | ICD-10-CM | POA: Diagnosis not present

## 2019-12-18 DIAGNOSIS — D649 Anemia, unspecified: Secondary | ICD-10-CM

## 2019-12-18 DIAGNOSIS — G4733 Obstructive sleep apnea (adult) (pediatric): Secondary | ICD-10-CM | POA: Diagnosis not present

## 2019-12-18 DIAGNOSIS — Z86718 Personal history of other venous thrombosis and embolism: Secondary | ICD-10-CM | POA: Diagnosis not present

## 2019-12-18 DIAGNOSIS — R197 Diarrhea, unspecified: Secondary | ICD-10-CM | POA: Diagnosis not present

## 2019-12-18 DIAGNOSIS — I4891 Unspecified atrial fibrillation: Secondary | ICD-10-CM | POA: Diagnosis not present

## 2019-12-18 DIAGNOSIS — R2681 Unsteadiness on feet: Secondary | ICD-10-CM | POA: Diagnosis not present

## 2019-12-18 DIAGNOSIS — N390 Urinary tract infection, site not specified: Secondary | ICD-10-CM | POA: Diagnosis not present

## 2019-12-18 DIAGNOSIS — R7303 Prediabetes: Secondary | ICD-10-CM | POA: Diagnosis not present

## 2019-12-18 DIAGNOSIS — Z87440 Personal history of urinary (tract) infections: Secondary | ICD-10-CM | POA: Diagnosis not present

## 2019-12-18 DIAGNOSIS — M542 Cervicalgia: Secondary | ICD-10-CM | POA: Diagnosis not present

## 2019-12-18 DIAGNOSIS — F339 Major depressive disorder, recurrent, unspecified: Secondary | ICD-10-CM | POA: Diagnosis not present

## 2019-12-18 DIAGNOSIS — M5416 Radiculopathy, lumbar region: Secondary | ICD-10-CM | POA: Diagnosis not present

## 2019-12-18 DIAGNOSIS — M1990 Unspecified osteoarthritis, unspecified site: Secondary | ICD-10-CM

## 2019-12-18 DIAGNOSIS — R410 Disorientation, unspecified: Secondary | ICD-10-CM | POA: Diagnosis not present

## 2019-12-18 DIAGNOSIS — R531 Weakness: Secondary | ICD-10-CM | POA: Diagnosis not present

## 2019-12-18 LAB — CBC: RBC: 3.04 — AB (ref 3.87–5.11)

## 2019-12-18 LAB — BASIC METABOLIC PANEL
BUN: 12 (ref 4–21)
CO2: 29 — AB (ref 13–22)
Chloride: 101 (ref 99–108)
Creatinine: 0.8 (ref 0.5–1.1)
Glucose: 105
Potassium: 4.1 (ref 3.4–5.3)
Sodium: 136 — AB (ref 137–147)

## 2019-12-18 LAB — COMPREHENSIVE METABOLIC PANEL
Albumin: 2.7 — AB (ref 3.5–5.0)
Calcium: 9.1 (ref 8.7–10.7)
Globulin: 2.2

## 2019-12-18 LAB — CBC AND DIFFERENTIAL
HCT: 26 — AB (ref 36–46)
Hemoglobin: 8.4 — AB (ref 12.0–16.0)
Platelets: 407 — AB (ref 150–399)
WBC: 6.5

## 2019-12-18 LAB — HEPATIC FUNCTION PANEL
ALT: 5 — AB (ref 7–35)
AST: 11 — AB (ref 13–35)

## 2019-12-19 ENCOUNTER — Encounter: Payer: Self-pay | Admitting: Internal Medicine

## 2019-12-22 DIAGNOSIS — R197 Diarrhea, unspecified: Secondary | ICD-10-CM | POA: Diagnosis not present

## 2019-12-22 DIAGNOSIS — R531 Weakness: Secondary | ICD-10-CM | POA: Diagnosis not present

## 2019-12-22 DIAGNOSIS — N390 Urinary tract infection, site not specified: Secondary | ICD-10-CM | POA: Diagnosis not present

## 2019-12-22 DIAGNOSIS — R2681 Unsteadiness on feet: Secondary | ICD-10-CM | POA: Diagnosis not present

## 2019-12-22 DIAGNOSIS — I4891 Unspecified atrial fibrillation: Secondary | ICD-10-CM | POA: Diagnosis not present

## 2019-12-22 DIAGNOSIS — R410 Disorientation, unspecified: Secondary | ICD-10-CM | POA: Diagnosis not present

## 2019-12-24 DIAGNOSIS — R531 Weakness: Secondary | ICD-10-CM | POA: Diagnosis not present

## 2019-12-24 DIAGNOSIS — R197 Diarrhea, unspecified: Secondary | ICD-10-CM | POA: Diagnosis not present

## 2019-12-24 DIAGNOSIS — R410 Disorientation, unspecified: Secondary | ICD-10-CM | POA: Diagnosis not present

## 2019-12-24 DIAGNOSIS — I4891 Unspecified atrial fibrillation: Secondary | ICD-10-CM | POA: Diagnosis not present

## 2019-12-24 DIAGNOSIS — N390 Urinary tract infection, site not specified: Secondary | ICD-10-CM | POA: Diagnosis not present

## 2019-12-24 DIAGNOSIS — R2681 Unsteadiness on feet: Secondary | ICD-10-CM | POA: Diagnosis not present

## 2019-12-25 DIAGNOSIS — N390 Urinary tract infection, site not specified: Secondary | ICD-10-CM | POA: Diagnosis not present

## 2019-12-25 DIAGNOSIS — I4891 Unspecified atrial fibrillation: Secondary | ICD-10-CM | POA: Diagnosis not present

## 2019-12-25 DIAGNOSIS — R531 Weakness: Secondary | ICD-10-CM | POA: Diagnosis not present

## 2019-12-25 DIAGNOSIS — R2681 Unsteadiness on feet: Secondary | ICD-10-CM | POA: Diagnosis not present

## 2019-12-25 DIAGNOSIS — R197 Diarrhea, unspecified: Secondary | ICD-10-CM | POA: Diagnosis not present

## 2019-12-25 DIAGNOSIS — R410 Disorientation, unspecified: Secondary | ICD-10-CM | POA: Diagnosis not present

## 2019-12-26 DIAGNOSIS — R197 Diarrhea, unspecified: Secondary | ICD-10-CM | POA: Diagnosis not present

## 2019-12-26 DIAGNOSIS — R2681 Unsteadiness on feet: Secondary | ICD-10-CM | POA: Diagnosis not present

## 2019-12-26 DIAGNOSIS — R410 Disorientation, unspecified: Secondary | ICD-10-CM | POA: Diagnosis not present

## 2019-12-26 DIAGNOSIS — N390 Urinary tract infection, site not specified: Secondary | ICD-10-CM | POA: Diagnosis not present

## 2019-12-26 DIAGNOSIS — R531 Weakness: Secondary | ICD-10-CM | POA: Diagnosis not present

## 2019-12-26 DIAGNOSIS — I4891 Unspecified atrial fibrillation: Secondary | ICD-10-CM | POA: Diagnosis not present

## 2019-12-27 DIAGNOSIS — Z23 Encounter for immunization: Secondary | ICD-10-CM | POA: Diagnosis not present

## 2019-12-29 ENCOUNTER — Other Ambulatory Visit: Payer: Self-pay | Admitting: *Deleted

## 2019-12-29 DIAGNOSIS — Z8781 Personal history of (healed) traumatic fracture: Secondary | ICD-10-CM

## 2019-12-29 DIAGNOSIS — R410 Disorientation, unspecified: Secondary | ICD-10-CM | POA: Diagnosis not present

## 2019-12-29 DIAGNOSIS — R2681 Unsteadiness on feet: Secondary | ICD-10-CM | POA: Diagnosis not present

## 2019-12-29 DIAGNOSIS — I4891 Unspecified atrial fibrillation: Secondary | ICD-10-CM | POA: Diagnosis not present

## 2019-12-29 DIAGNOSIS — R531 Weakness: Secondary | ICD-10-CM | POA: Diagnosis not present

## 2019-12-29 DIAGNOSIS — R197 Diarrhea, unspecified: Secondary | ICD-10-CM | POA: Diagnosis not present

## 2019-12-29 DIAGNOSIS — N390 Urinary tract infection, site not specified: Secondary | ICD-10-CM | POA: Diagnosis not present

## 2019-12-29 MED ORDER — OXYCODONE-ACETAMINOPHEN 5-325 MG PO TABS: 1.0000 | ORAL_TABLET | ORAL | 0 refills | Status: DC | PRN

## 2019-12-29 NOTE — Telephone Encounter (Signed)
Patient caregiver requested refill [place] Verified LR: 11/12/19 Pended Rx and sent to Dr. Mariea Clonts for approval.

## 2019-12-30 DIAGNOSIS — M7051 Other bursitis of knee, right knee: Secondary | ICD-10-CM | POA: Diagnosis not present

## 2019-12-30 DIAGNOSIS — M9701XD Periprosthetic fracture around internal prosthetic right hip joint, subsequent encounter: Secondary | ICD-10-CM | POA: Diagnosis not present

## 2020-01-02 DIAGNOSIS — R2681 Unsteadiness on feet: Secondary | ICD-10-CM | POA: Diagnosis not present

## 2020-01-02 DIAGNOSIS — R197 Diarrhea, unspecified: Secondary | ICD-10-CM | POA: Diagnosis not present

## 2020-01-02 DIAGNOSIS — I4891 Unspecified atrial fibrillation: Secondary | ICD-10-CM | POA: Diagnosis not present

## 2020-01-02 DIAGNOSIS — R531 Weakness: Secondary | ICD-10-CM | POA: Diagnosis not present

## 2020-01-02 DIAGNOSIS — R410 Disorientation, unspecified: Secondary | ICD-10-CM | POA: Diagnosis not present

## 2020-01-02 DIAGNOSIS — N390 Urinary tract infection, site not specified: Secondary | ICD-10-CM | POA: Diagnosis not present

## 2020-01-05 DIAGNOSIS — N390 Urinary tract infection, site not specified: Secondary | ICD-10-CM | POA: Diagnosis not present

## 2020-01-05 DIAGNOSIS — R531 Weakness: Secondary | ICD-10-CM | POA: Diagnosis not present

## 2020-01-05 DIAGNOSIS — R197 Diarrhea, unspecified: Secondary | ICD-10-CM | POA: Diagnosis not present

## 2020-01-05 DIAGNOSIS — I4891 Unspecified atrial fibrillation: Secondary | ICD-10-CM | POA: Diagnosis not present

## 2020-01-05 DIAGNOSIS — R2681 Unsteadiness on feet: Secondary | ICD-10-CM | POA: Diagnosis not present

## 2020-01-05 DIAGNOSIS — R410 Disorientation, unspecified: Secondary | ICD-10-CM | POA: Diagnosis not present

## 2020-01-09 DIAGNOSIS — R2681 Unsteadiness on feet: Secondary | ICD-10-CM | POA: Diagnosis not present

## 2020-01-09 DIAGNOSIS — R197 Diarrhea, unspecified: Secondary | ICD-10-CM | POA: Diagnosis not present

## 2020-01-09 DIAGNOSIS — R531 Weakness: Secondary | ICD-10-CM | POA: Diagnosis not present

## 2020-01-09 DIAGNOSIS — N390 Urinary tract infection, site not specified: Secondary | ICD-10-CM | POA: Diagnosis not present

## 2020-01-09 DIAGNOSIS — I4891 Unspecified atrial fibrillation: Secondary | ICD-10-CM | POA: Diagnosis not present

## 2020-01-09 DIAGNOSIS — R410 Disorientation, unspecified: Secondary | ICD-10-CM | POA: Diagnosis not present

## 2020-01-12 DIAGNOSIS — N390 Urinary tract infection, site not specified: Secondary | ICD-10-CM | POA: Diagnosis not present

## 2020-01-12 DIAGNOSIS — R2681 Unsteadiness on feet: Secondary | ICD-10-CM | POA: Diagnosis not present

## 2020-01-12 DIAGNOSIS — I4891 Unspecified atrial fibrillation: Secondary | ICD-10-CM | POA: Diagnosis not present

## 2020-01-12 DIAGNOSIS — R531 Weakness: Secondary | ICD-10-CM | POA: Diagnosis not present

## 2020-01-12 DIAGNOSIS — R410 Disorientation, unspecified: Secondary | ICD-10-CM | POA: Diagnosis not present

## 2020-01-12 DIAGNOSIS — R197 Diarrhea, unspecified: Secondary | ICD-10-CM | POA: Diagnosis not present

## 2020-01-13 DIAGNOSIS — R2681 Unsteadiness on feet: Secondary | ICD-10-CM | POA: Diagnosis not present

## 2020-01-13 DIAGNOSIS — N390 Urinary tract infection, site not specified: Secondary | ICD-10-CM | POA: Diagnosis not present

## 2020-01-13 DIAGNOSIS — R531 Weakness: Secondary | ICD-10-CM | POA: Diagnosis not present

## 2020-01-13 DIAGNOSIS — R197 Diarrhea, unspecified: Secondary | ICD-10-CM | POA: Diagnosis not present

## 2020-01-13 DIAGNOSIS — I4891 Unspecified atrial fibrillation: Secondary | ICD-10-CM | POA: Diagnosis not present

## 2020-01-13 DIAGNOSIS — R410 Disorientation, unspecified: Secondary | ICD-10-CM | POA: Diagnosis not present

## 2020-01-15 DIAGNOSIS — I4891 Unspecified atrial fibrillation: Secondary | ICD-10-CM | POA: Diagnosis not present

## 2020-01-15 DIAGNOSIS — R531 Weakness: Secondary | ICD-10-CM | POA: Diagnosis not present

## 2020-01-15 DIAGNOSIS — R197 Diarrhea, unspecified: Secondary | ICD-10-CM | POA: Diagnosis not present

## 2020-01-15 DIAGNOSIS — R2681 Unsteadiness on feet: Secondary | ICD-10-CM | POA: Diagnosis not present

## 2020-01-15 DIAGNOSIS — R410 Disorientation, unspecified: Secondary | ICD-10-CM | POA: Diagnosis not present

## 2020-01-15 DIAGNOSIS — N390 Urinary tract infection, site not specified: Secondary | ICD-10-CM | POA: Diagnosis not present

## 2020-01-16 ENCOUNTER — Encounter: Payer: Self-pay | Admitting: Physician Assistant

## 2020-01-16 ENCOUNTER — Telehealth: Payer: Self-pay | Admitting: *Deleted

## 2020-01-16 DIAGNOSIS — R197 Diarrhea, unspecified: Secondary | ICD-10-CM

## 2020-01-16 NOTE — Telephone Encounter (Signed)
Appears she already had a visit with [place] for this and it has persisted so must not have just been her cheddar soup.  Referral placed to GI as mentioned in NP note.

## 2020-01-16 NOTE — Telephone Encounter (Signed)
Pt caregiver calling asking for a referral to GI, per caregiver pt keep having diarrhea off and on. Please advise

## 2020-01-17 DIAGNOSIS — D649 Anemia, unspecified: Secondary | ICD-10-CM | POA: Diagnosis not present

## 2020-01-17 DIAGNOSIS — R531 Weakness: Secondary | ICD-10-CM | POA: Diagnosis not present

## 2020-01-17 DIAGNOSIS — R2681 Unsteadiness on feet: Secondary | ICD-10-CM | POA: Diagnosis not present

## 2020-01-17 DIAGNOSIS — R410 Disorientation, unspecified: Secondary | ICD-10-CM | POA: Diagnosis not present

## 2020-01-17 DIAGNOSIS — R7303 Prediabetes: Secondary | ICD-10-CM | POA: Diagnosis not present

## 2020-01-17 DIAGNOSIS — F339 Major depressive disorder, recurrent, unspecified: Secondary | ICD-10-CM | POA: Diagnosis not present

## 2020-01-17 DIAGNOSIS — M5416 Radiculopathy, lumbar region: Secondary | ICD-10-CM | POA: Diagnosis not present

## 2020-01-17 DIAGNOSIS — I4891 Unspecified atrial fibrillation: Secondary | ICD-10-CM | POA: Diagnosis not present

## 2020-01-17 DIAGNOSIS — R197 Diarrhea, unspecified: Secondary | ICD-10-CM | POA: Diagnosis not present

## 2020-01-17 DIAGNOSIS — F419 Anxiety disorder, unspecified: Secondary | ICD-10-CM | POA: Diagnosis not present

## 2020-01-17 DIAGNOSIS — M1990 Unspecified osteoarthritis, unspecified site: Secondary | ICD-10-CM | POA: Diagnosis not present

## 2020-01-17 DIAGNOSIS — Z87440 Personal history of urinary (tract) infections: Secondary | ICD-10-CM | POA: Diagnosis not present

## 2020-01-17 DIAGNOSIS — N390 Urinary tract infection, site not specified: Secondary | ICD-10-CM | POA: Diagnosis not present

## 2020-01-17 DIAGNOSIS — M542 Cervicalgia: Secondary | ICD-10-CM | POA: Diagnosis not present

## 2020-01-17 DIAGNOSIS — Z23 Encounter for immunization: Secondary | ICD-10-CM | POA: Diagnosis not present

## 2020-01-17 DIAGNOSIS — G4733 Obstructive sleep apnea (adult) (pediatric): Secondary | ICD-10-CM | POA: Diagnosis not present

## 2020-01-17 DIAGNOSIS — Z86718 Personal history of other venous thrombosis and embolism: Secondary | ICD-10-CM | POA: Diagnosis not present

## 2020-01-21 DIAGNOSIS — N390 Urinary tract infection, site not specified: Secondary | ICD-10-CM | POA: Diagnosis not present

## 2020-01-21 DIAGNOSIS — R410 Disorientation, unspecified: Secondary | ICD-10-CM | POA: Diagnosis not present

## 2020-01-21 DIAGNOSIS — R531 Weakness: Secondary | ICD-10-CM | POA: Diagnosis not present

## 2020-01-21 DIAGNOSIS — I4891 Unspecified atrial fibrillation: Secondary | ICD-10-CM | POA: Diagnosis not present

## 2020-01-21 DIAGNOSIS — R197 Diarrhea, unspecified: Secondary | ICD-10-CM | POA: Diagnosis not present

## 2020-01-21 DIAGNOSIS — R2681 Unsteadiness on feet: Secondary | ICD-10-CM | POA: Diagnosis not present

## 2020-01-22 DIAGNOSIS — R410 Disorientation, unspecified: Secondary | ICD-10-CM | POA: Diagnosis not present

## 2020-01-22 DIAGNOSIS — R197 Diarrhea, unspecified: Secondary | ICD-10-CM | POA: Diagnosis not present

## 2020-01-22 DIAGNOSIS — R531 Weakness: Secondary | ICD-10-CM | POA: Diagnosis not present

## 2020-01-22 DIAGNOSIS — M9701XD Periprosthetic fracture around internal prosthetic right hip joint, subsequent encounter: Secondary | ICD-10-CM | POA: Diagnosis not present

## 2020-01-22 DIAGNOSIS — I4891 Unspecified atrial fibrillation: Secondary | ICD-10-CM | POA: Diagnosis not present

## 2020-01-22 DIAGNOSIS — R2681 Unsteadiness on feet: Secondary | ICD-10-CM | POA: Diagnosis not present

## 2020-01-22 DIAGNOSIS — N390 Urinary tract infection, site not specified: Secondary | ICD-10-CM | POA: Diagnosis not present

## 2020-01-26 DIAGNOSIS — N390 Urinary tract infection, site not specified: Secondary | ICD-10-CM | POA: Diagnosis not present

## 2020-01-26 DIAGNOSIS — R410 Disorientation, unspecified: Secondary | ICD-10-CM | POA: Diagnosis not present

## 2020-01-26 DIAGNOSIS — I4891 Unspecified atrial fibrillation: Secondary | ICD-10-CM | POA: Diagnosis not present

## 2020-01-26 DIAGNOSIS — R2681 Unsteadiness on feet: Secondary | ICD-10-CM | POA: Diagnosis not present

## 2020-01-26 DIAGNOSIS — R531 Weakness: Secondary | ICD-10-CM | POA: Diagnosis not present

## 2020-01-26 DIAGNOSIS — R197 Diarrhea, unspecified: Secondary | ICD-10-CM | POA: Diagnosis not present

## 2020-01-28 ENCOUNTER — Ambulatory Visit (INDEPENDENT_AMBULATORY_CARE_PROVIDER_SITE_OTHER): Payer: Medicare Other | Admitting: Physician Assistant

## 2020-01-28 ENCOUNTER — Encounter: Payer: Self-pay | Admitting: Physician Assistant

## 2020-01-28 VITALS — BP 94/60 | HR 73 | Temp 98.2°F

## 2020-01-28 DIAGNOSIS — Z1212 Encounter for screening for malignant neoplasm of rectum: Secondary | ICD-10-CM | POA: Diagnosis not present

## 2020-01-28 DIAGNOSIS — Z1211 Encounter for screening for malignant neoplasm of colon: Secondary | ICD-10-CM

## 2020-01-28 DIAGNOSIS — R197 Diarrhea, unspecified: Secondary | ICD-10-CM

## 2020-01-28 NOTE — Patient Instructions (Signed)
If you are age 83 or older, your body mass index should be between 23-30. Your There is no height or weight on file to calculate BMI. If this is out of the aforementioned range listed, please consider follow up with your Primary Care Provider.  If you are age 62 or younger, your body mass index should be between 19-25. Your There is no height or weight on file to calculate BMI. If this is out of the aformentioned range listed, please consider follow up with your Primary Care Provider.   Continue Probiotic and Lactaid.

## 2020-01-28 NOTE — Progress Notes (Signed)
Chief Complaint: Diarrhea  HPI:    Andrea [name] is an 85 year old Caucasian female with a past medical history as listed below including A. fib on Eliquis (03/27/2019 echo with LVEF 60-65%), breast cancer, anxiety, DVT, depression and others, who was referred to me by Gayland Curry, DO for a complaint of diarrhea.      07/31/2019 CT abdomen pelvis with contrast with no acute findings, aortic atherosclerosis and hepatic cysts.    12/16/2019 patient had a video visit with geriatric medicine.  It was noted that she had diarrhea 12/10/2019 but no fever.  Apparently saw orthopedic surgeon at that time thought she might have a UTI.  Had completed Cipro 12/11/2019.  Apparently had some cheddar soup 12/13/2019 which made her wake up and have diarrhea.  She continues with diarrhea and was very weak.  Apparently took some Imodium.  It was recommended she go to the ED but daughter declined due to concerns for Covid.    Today, the patient presents to clinic accompanied by her caregiver of 10 years and they tell me that she was having trouble with diarrhea for a couple months, but over the past 2 weeks they started using Lactaid, as well as a probiotic, and some digestive health advantage and all of her symptoms are gone.  Now having solid bowel movements.    Patient tells me she has never had screening for colorectal cancer.    Denies fever, chills, weight loss, blood in her stool or symptoms that awaken her from sleep.  Past Medical History:  Diagnosis Date  . A-fib ([place])   . Anemia   . Anxiety   . Arthritis    hands, spine   . Bladder incontinence   . Blood transfusion   . Cancer ([place]) 02/2019   left breast cancer  . Cervicalgia   . Constipation   . Cough    Wert-onset 08/2009, as of 2014- resolved   . Deep venous thrombosis ([place])    post op, rec'd/needed  blood thinner   . Depression   . Depression with anxiety   . Dysrhythmia    a-fib  . Falls frequently    pt. reports that she was falling 3  times a day, has had PT at a facility for a while & now is getting home PT 2-3 times/ week   . Headache(784.0) 09/27/2011  . History of stress test    15-20 yrs.ago   . Hypotension   . Hypothyroidism   . Lumbar radiculopathy 09/26/2011  . Pneumonia    never been in hosp. for pneumonia   . Prediabetes   . Right hand fracture    Monday  . Shortness of breath   . Sleep apnea    does not use consistently    Past Surgical History:  Procedure Laterality Date  . ANTERIOR CERVICAL DECOMP/DISCECTOMY FUSION N/A 07/09/2013   Procedure: ANTERIOR CERVICAL DECOMPRESSION/DISCECTOMY FUSION 3 LEVELS;  Surgeon: Sinclair Ship, MD;  Location: [facility];  Service: Orthopedics;  Laterality: N/A;  Anterior cervical decompression fusion, cervical 3-4, cervical 4-5-, cervical 5-6 with instrumentation, allograft.  . APPENDECTOMY    . BACK SURGERY     2000  . BREAST EXCISIONAL BIOPSY     breast bxs on left x 2, breast bx of right x 1-all benign  . EYE SURGERY     cataracts bilateral /w IOL  . HERNIA REPAIR     umbilical hernia- [id number]  . IR IMAGING GUIDED PORT INSERTION  04/28/2019  .  JOINT REPLACEMENT  2006,2007   bilateral  . LAPAROSCOPIC OVARIAN CYSTECTOMY     not done by laparscopy-abdominal incision  . ORIF PERIPROSTHETIC FRACTURE Right 07/26/2019   Procedure: OPEN REDUCTION INTERNAL FIXATION (ORIF) RIGHT FEMUR PERIPROSTHETIC FRACTURE;  Surgeon: Rod Can, MD;  Location: [facility];  Service: Orthopedics;  Laterality: Right;  . PORTACATH PLACEMENT Right 04/01/2019   Procedure: ATTEMPTED INSERTION PORT-A-CATH WITH ULTRASOUND;  Surgeon: [name], MD;  Location: [place];  Service: General;  Laterality: Right;  . RADIOACTIVE SEED GUIDED PARTIAL MASTECTOMY WITH AXILLARY SENTINEL LYMPH NODE BIOPSY Left 04/01/2019   Procedure: LEFT BREAST PARTIAL MASTECTOMY WITH RADIOACTIVE SEED AND LEFT SENTINEL LYMPH NODE BIOPSY;  Surgeon: [name], MD;  Location: [place];   Service: General;  Laterality: Left;  . RE-EXCISION OF BREAST CANCER,SUPERIOR MARGINS Left 04/15/2019   Procedure: RE-EXCISION OF LEFT BREAST CANCER POSITIVE MARGINS;  Surgeon: [name], MD;  Location: [facility];  Service: General;  Laterality: Left;  . REPLACEMENT TOTAL KNEE BILATERAL    . TONSILLECTOMY      Current Outpatient Medications  Medication Sig Dispense Refill  . Brexpiprazole 1 MG TABS Take 1 mg by mouth at bedtime.     . busPIRone (BUSPAR) 5 MG tablet Take 5-10 mg by mouth 2 (two) times daily. 10 MG IN THE MORNING, 5 MG AT DINNER    . CALCIUM CITRATE PO Take 600 mg by mouth daily.     . clonazePAM (KLONOPIN) 0.5 MG tablet Take 1 tablet by mouth as needed in middle of the nite to go back to sleep 20 tablet 0  . CRANBERRY PO Take 2 tablets by mouth daily.    . DULoxetine (CYMBALTA) 60 MG capsule Take 1 capsule (60 mg total) by mouth daily. 180 capsule 3  . ELIQUIS 5 MG TABS tablet TAKE 1 TABLET BY MOUTH TWICE A DAY 60 tablet 5  . levothyroxine (SYNTHROID) 50 MCG tablet TAKE ONE TABLET BY MOUTH ONCE DAILY 30 MINUTES BEFORE BREAKFAST FOR THYROID 90 tablet 1  . lidocaine (LMX) 4 % cream Apply 1 application topically as needed (port).    . mirtazapine (REMERON) 15 MG tablet Take 7.5 mg by mouth at bedtime.  2  . Multiple Vitamins-Minerals (MULTIVITAMIN PO) Take 1 tablet by mouth daily.     [name] oxybutynin (DITROPAN-XL) 10 MG 24 hr tablet Take 10 mg by mouth daily.    [name] oxyCODONE-acetaminophen (PERCOCET) 5-325 MG tablet Take 1 tablet by mouth every 4 (four) hours as needed for severe pain. 180 tablet 0  . senna-docusate (SENOKOT-S) 8.6-50 MG tablet Take 1 tablet by mouth 2 (two) times daily as needed for mild constipation.    . traZODone (DESYREL) 100 MG tablet Take 100 mg by mouth at bedtime.    . vitamin B-12 (CYANOCOBALAMIN) 500 MCG tablet Take 500 mcg by mouth daily.     No current facility-administered medications for this visit.    Allergies as of  01/28/2020 - Review Complete 12/04/2019  Allergen Reaction Noted  . Clindamycin hcl Other (See Comments)     Family History  Problem Relation Age of Onset  . Heart disease Father   . Malignant hyperthermia Father   . Arthritis Father   . Deep vein thrombosis Son   . Diabetes Son   . Obesity Son   . Depression Son   . Hypertension Son   . Parkinson's disease Mother   . Diabetes Daughter   . Hypertension Daughter   .  Anxiety disorder Daughter   . Hyperlipidemia Son   . Hypertension Son   . Cancer Maternal Aunt     Social History   Socioeconomic History  . Marital status: Single    Spouse name: Not on file  . Number of children: 3  . Years of education: College  . Highest education level: Not on file  Occupational History  . Occupation: Retired Therapist, sports  Tobacco Use  . Smoking status: Current Some Day Smoker    Packs/day: 0.50    Years: 20.00    Pack years: 10.00    Types: Cigarettes  . Smokeless tobacco: Never Used  . Tobacco comment: Started back a few months ago as of 02/20/2019. 5-6 cig's daily  Substance and Sexual Activity  . Alcohol use: Yes    Comment: Glass of wine 1-2 times a year at Christmas  . Drug use: No  . Sexual activity: Never  Other Topics Concern  . Not on file  Social History Narrative   Patient is single and lives alone.   Patient is retired.   Patient has a college education.   Patient has three adult children   Patient is right-handed.   Patient drinks 3-4 cups of caffeine (soda and coffee) daily.   Walks with walker   Social Determinants of Health   Financial Resource Strain:   . Difficulty of Paying Living Expenses: Not on file  Food Insecurity:   . Worried About Charity fundraiser in the Last Year: Not on file  . Ran Out of Food in the Last Year: Not on file  Transportation Needs:   . Lack of Transportation (Medical): Not on file  . Lack of Transportation (Non-Medical): Not on file  Physical Activity:   . Days of Exercise per Week:  Not on file  . Minutes of Exercise per Session: Not on file  Stress:   . Feeling of Stress : Not on file  Social Connections:   . Frequency of Communication with Friends and Family: Not on file  . Frequency of Social Gatherings with Friends and Family: Not on file  . Attends Religious Services: Not on file  . Active Member of Clubs or Organizations: Not on file  . Attends Archivist Meetings: Not on file  . Marital Status: Not on file  Intimate Partner Violence:   . Fear of Current or Ex-Partner: Not on file  . Emotionally Abused: Not on file  . Physically Abused: Not on file  . Sexually Abused: Not on file    Review of Systems:    Constitutional: No weight loss, fever or chills Skin: No rash  Cardiovascular: No chest pain  Respiratory: No SOB  Gastrointestinal: See HPI and otherwise negative Genitourinary: No dysuria  Neurological: No headache Musculoskeletal: No new muscle or joint pain Hematologic: No bleeding  Psychiatric: No history of depression or anxiety   Physical Exam:  Vital signs: BP 94/60   Pulse 73   Temp 98.2 F (36.8 C)   Constitutional:   Pleasant frail-appearing elderly Caucasian female appears to be in NAD, Well developed, Well nourished, alert and cooperative Head:  Normocephalic and atraumatic. Eyes:   PEERL, EOMI. No icterus. Conjunctiva pink. Ears:  Normal auditory acuity. Neck:  Supple Throat: Oral cavity and pharynx without inflammation, swelling or lesion.  Respiratory: Respirations even and unlabored. Lungs clear to auscultation bilaterally.   No wheezes, crackles, or rhonchi.  Cardiovascular: Normal S1, S2. No MRG. Regular rate and rhythm. No peripheral edema, cyanosis  or pallor.  Gastrointestinal:  Soft, nondistended, nontender. No rebound or guarding. Normal bowel sounds. No appreciable masses or hepatomegaly. Rectal:  Not performed.  Msk:  Symmetrical without gross deformities. Without edema, no deformity or joint abnormality.   Ambulating in wheelchair Neurologic:  Alert and  oriented x4;  grossly normal neurologically.  Skin:   Dry and intact without significant lesions or rashes. Psychiatric: Demonstrates good judgement and reason without abnormal affect or behaviors.  RELEVANT LABS AND IMAGING: CBC    Component Value Date/Time   WBC 6.5 12/18/2019 0000   WBC 9.1 08/06/2019 0334   RBC 3.04 (A) 12/18/2019 1500   HGB 8.4 (A) 12/18/2019 0000   HGB 12.2 06/20/2019 0936   HGB 13.2 09/15/2015 0816   HCT 26 (A) 12/18/2019 0000   HCT 40.2 09/15/2015 0816   PLT 407 (A) 12/18/2019 0000   PLT 216 06/20/2019 0936   PLT 199 09/15/2015 0816   MCV 93.6 08/06/2019 0334   MCV 89 09/15/2015 0816   MCH 31.6 08/06/2019 0334   MCHC 33.7 08/06/2019 0334   RDW 18.4 (H) 08/06/2019 0334   RDW 14.3 09/15/2015 0816   LYMPHSABS 1.3 08/06/2019 0334   LYMPHSABS 2.1 09/15/2015 0816   MONOABS 0.7 08/06/2019 0334   EOSABS 0.2 08/06/2019 0334   EOSABS 0.2 09/15/2015 0816   BASOSABS 0.1 08/06/2019 0334   BASOSABS 0.0 09/15/2015 0816    CMP     Component Value Date/Time   NA 136 (A) 12/18/2019 1500   K 4.1 12/18/2019 1500   CL 101 12/18/2019 1500   CO2 29 (A) 12/18/2019 1500   GLUCOSE 107 (H) 08/06/2019 0334   BUN 12 12/18/2019 1500   CREATININE 0.8 12/18/2019 1500   CREATININE 0.65 08/06/2019 0334   CREATININE 0.94 (H) 06/30/2019 1005   CALCIUM 9.1 12/18/2019 1500   PROT 5.2 (L) 08/05/2019 1144   PROT 6.2 12/20/2015 1357   ALBUMIN 2.7 (A) 12/18/2019 1500   ALBUMIN 4.2 12/20/2015 1357   AST 11 (A) 12/18/2019 1500   AST 16 06/20/2019 0936   ALT 5 (A) 12/18/2019 1500   ALT 10 06/20/2019 0936   ALKPHOS 57 08/05/2019 1144   BILITOT 0.9 08/05/2019 1144   BILITOT 0.3 06/20/2019 0936   GFRNONAA >60 08/06/2019 0334   GFRNONAA 58 (L) 06/20/2019 0936   GFRNONAA 57 (L) 10/16/2018 0849   GFRAA >60 08/06/2019 0334   GFRAA >60 06/20/2019 0936   GFRAA 66 10/16/2018 0849    Assessment: 1.  Diarrhea: Resolved now that the  patient is on Lactaid and a probiotic 2.  Screening for colorectal cancer: Patient has never had screening for colorectal cancer but is not interested  Plan: 1.  Discussed screening with the patient in depth.  Also discussed Cologuard as a different option.  Patient is not interested in colon cancer screening as she feels fine and would not do anything about cancer anyways. 2.  Recommend they continue Lactaid as well as a probiotic and digestive health advantage for at least the next couple of months.  They can then see how she does without them.  If has diarrhea again can continue. 3.  Patient to follow in clinic with [place] as needed in the future.  She was assigned to Dr. Carlean Purl this morning.  Ellouise Newer, PA-C [facility] 01/28/2020, 10:39 AM  Cc: Gayland Curry, DO

## 2020-02-02 DIAGNOSIS — I4891 Unspecified atrial fibrillation: Secondary | ICD-10-CM | POA: Diagnosis not present

## 2020-02-02 DIAGNOSIS — R531 Weakness: Secondary | ICD-10-CM | POA: Diagnosis not present

## 2020-02-02 DIAGNOSIS — N390 Urinary tract infection, site not specified: Secondary | ICD-10-CM | POA: Diagnosis not present

## 2020-02-02 DIAGNOSIS — R2681 Unsteadiness on feet: Secondary | ICD-10-CM | POA: Diagnosis not present

## 2020-02-02 DIAGNOSIS — R197 Diarrhea, unspecified: Secondary | ICD-10-CM | POA: Diagnosis not present

## 2020-02-02 DIAGNOSIS — R410 Disorientation, unspecified: Secondary | ICD-10-CM | POA: Diagnosis not present

## 2020-02-03 DIAGNOSIS — M7051 Other bursitis of knee, right knee: Secondary | ICD-10-CM | POA: Diagnosis not present

## 2020-02-03 DIAGNOSIS — M9701XD Periprosthetic fracture around internal prosthetic right hip joint, subsequent encounter: Secondary | ICD-10-CM | POA: Diagnosis not present

## 2020-02-06 ENCOUNTER — Other Ambulatory Visit: Payer: Self-pay | Admitting: *Deleted

## 2020-02-06 DIAGNOSIS — Z8781 Personal history of (healed) traumatic fracture: Secondary | ICD-10-CM

## 2020-02-06 MED ORDER — OXYCODONE-ACETAMINOPHEN 5-325 MG PO TABS: 1.0000 | ORAL_TABLET | ORAL | 0 refills | Status: DC | PRN

## 2020-02-10 DIAGNOSIS — M6281 Muscle weakness (generalized): Secondary | ICD-10-CM | POA: Diagnosis not present

## 2020-02-10 DIAGNOSIS — M25561 Pain in right knee: Secondary | ICD-10-CM | POA: Diagnosis not present

## 2020-02-10 DIAGNOSIS — R262 Difficulty in walking, not elsewhere classified: Secondary | ICD-10-CM | POA: Diagnosis not present

## 2020-02-11 ENCOUNTER — Other Ambulatory Visit: Payer: Self-pay | Admitting: Orthopedic Surgery

## 2020-02-11 DIAGNOSIS — M9701XD Periprosthetic fracture around internal prosthetic right hip joint, subsequent encounter: Secondary | ICD-10-CM

## 2020-02-17 DIAGNOSIS — H18519 Endothelial corneal dystrophy, unspecified eye: Secondary | ICD-10-CM | POA: Diagnosis not present

## 2020-02-17 DIAGNOSIS — M6281 Muscle weakness (generalized): Secondary | ICD-10-CM | POA: Diagnosis not present

## 2020-02-17 DIAGNOSIS — R262 Difficulty in walking, not elsewhere classified: Secondary | ICD-10-CM | POA: Diagnosis not present

## 2020-02-17 DIAGNOSIS — M25561 Pain in right knee: Secondary | ICD-10-CM | POA: Diagnosis not present

## 2020-02-19 DIAGNOSIS — M25561 Pain in right knee: Secondary | ICD-10-CM | POA: Diagnosis not present

## 2020-02-19 DIAGNOSIS — R262 Difficulty in walking, not elsewhere classified: Secondary | ICD-10-CM | POA: Diagnosis not present

## 2020-02-19 DIAGNOSIS — M6281 Muscle weakness (generalized): Secondary | ICD-10-CM | POA: Diagnosis not present

## 2020-02-26 DIAGNOSIS — M25561 Pain in right knee: Secondary | ICD-10-CM | POA: Diagnosis not present

## 2020-02-26 DIAGNOSIS — R262 Difficulty in walking, not elsewhere classified: Secondary | ICD-10-CM | POA: Diagnosis not present

## 2020-02-26 DIAGNOSIS — M6281 Muscle weakness (generalized): Secondary | ICD-10-CM | POA: Diagnosis not present

## 2020-03-01 ENCOUNTER — Other Ambulatory Visit: Payer: Self-pay | Admitting: Internal Medicine

## 2020-03-01 ENCOUNTER — Ambulatory Visit
Admission: RE | Admit: 2020-03-01 | Discharge: 2020-03-01 | Disposition: A | Discharge status: Home / Self Care | Payer: Medicare Other | Source: Ambulatory Visit | Attending: Orthopedic Surgery | Admitting: Orthopedic Surgery

## 2020-03-01 ENCOUNTER — Other Ambulatory Visit: Payer: Self-pay

## 2020-03-01 DIAGNOSIS — Z7901 Long term (current) use of anticoagulants: Secondary | ICD-10-CM

## 2020-03-01 DIAGNOSIS — S72331A Displaced oblique fracture of shaft of right femur, initial encounter for closed fracture: Secondary | ICD-10-CM | POA: Diagnosis not present

## 2020-03-01 DIAGNOSIS — M7989 Other specified soft tissue disorders: Secondary | ICD-10-CM | POA: Diagnosis not present

## 2020-03-01 DIAGNOSIS — M9701XD Periprosthetic fracture around internal prosthetic right hip joint, subsequent encounter: Secondary | ICD-10-CM

## 2020-03-01 NOTE — Telephone Encounter (Signed)
rx sent to pharmacy by e-script  

## 2020-03-02 DIAGNOSIS — R262 Difficulty in walking, not elsewhere classified: Secondary | ICD-10-CM | POA: Diagnosis not present

## 2020-03-02 DIAGNOSIS — M6281 Muscle weakness (generalized): Secondary | ICD-10-CM | POA: Diagnosis not present

## 2020-03-02 DIAGNOSIS — M25561 Pain in right knee: Secondary | ICD-10-CM | POA: Diagnosis not present

## 2020-03-09 DIAGNOSIS — M25561 Pain in right knee: Secondary | ICD-10-CM | POA: Diagnosis not present

## 2020-03-09 DIAGNOSIS — R262 Difficulty in walking, not elsewhere classified: Secondary | ICD-10-CM | POA: Diagnosis not present

## 2020-03-09 DIAGNOSIS — M6281 Muscle weakness (generalized): Secondary | ICD-10-CM | POA: Diagnosis not present

## 2020-03-16 DIAGNOSIS — M25561 Pain in right knee: Secondary | ICD-10-CM | POA: Diagnosis not present

## 2020-03-16 DIAGNOSIS — M6281 Muscle weakness (generalized): Secondary | ICD-10-CM | POA: Diagnosis not present

## 2020-03-16 DIAGNOSIS — M9701XD Periprosthetic fracture around internal prosthetic right hip joint, subsequent encounter: Secondary | ICD-10-CM | POA: Diagnosis not present

## 2020-03-16 DIAGNOSIS — R262 Difficulty in walking, not elsewhere classified: Secondary | ICD-10-CM | POA: Diagnosis not present

## 2020-03-17 DIAGNOSIS — F341 Dysthymic disorder: Secondary | ICD-10-CM | POA: Diagnosis not present

## 2020-03-17 DIAGNOSIS — F411 Generalized anxiety disorder: Secondary | ICD-10-CM | POA: Diagnosis not present

## 2020-03-17 DIAGNOSIS — F331 Major depressive disorder, recurrent, moderate: Secondary | ICD-10-CM | POA: Diagnosis not present

## 2020-03-18 DIAGNOSIS — M6281 Muscle weakness (generalized): Secondary | ICD-10-CM | POA: Diagnosis not present

## 2020-03-18 DIAGNOSIS — R262 Difficulty in walking, not elsewhere classified: Secondary | ICD-10-CM | POA: Diagnosis not present

## 2020-03-18 DIAGNOSIS — M25561 Pain in right knee: Secondary | ICD-10-CM | POA: Diagnosis not present

## 2020-03-30 DIAGNOSIS — M25561 Pain in right knee: Secondary | ICD-10-CM | POA: Diagnosis not present

## 2020-03-30 DIAGNOSIS — M6281 Muscle weakness (generalized): Secondary | ICD-10-CM | POA: Diagnosis not present

## 2020-03-30 DIAGNOSIS — R262 Difficulty in walking, not elsewhere classified: Secondary | ICD-10-CM | POA: Diagnosis not present

## 2020-04-01 DIAGNOSIS — R262 Difficulty in walking, not elsewhere classified: Secondary | ICD-10-CM | POA: Diagnosis not present

## 2020-04-01 DIAGNOSIS — M25561 Pain in right knee: Secondary | ICD-10-CM | POA: Diagnosis not present

## 2020-04-01 DIAGNOSIS — M6281 Muscle weakness (generalized): Secondary | ICD-10-CM | POA: Diagnosis not present

## 2020-04-02 ENCOUNTER — Other Ambulatory Visit: Payer: Self-pay | Admitting: *Deleted

## 2020-04-02 DIAGNOSIS — Z8781 Personal history of (healed) traumatic fracture: Secondary | ICD-10-CM

## 2020-04-02 MED ORDER — OXYCODONE-ACETAMINOPHEN 5-325 MG PO TABS: 1.0000 | ORAL_TABLET | ORAL | 0 refills | Status: DC | PRN

## 2020-04-02 NOTE — Telephone Encounter (Signed)
Patient caregiver Nicki requested refill. [place] Verified LR: 02/06/2020 Has not updated contract. Pended Rx and sent to Dr. Mariea Clonts for approval.

## 2020-04-06 DIAGNOSIS — R262 Difficulty in walking, not elsewhere classified: Secondary | ICD-10-CM | POA: Diagnosis not present

## 2020-04-06 DIAGNOSIS — M6281 Muscle weakness (generalized): Secondary | ICD-10-CM | POA: Diagnosis not present

## 2020-04-06 DIAGNOSIS — M25561 Pain in right knee: Secondary | ICD-10-CM | POA: Diagnosis not present

## 2020-04-08 DIAGNOSIS — M6281 Muscle weakness (generalized): Secondary | ICD-10-CM | POA: Diagnosis not present

## 2020-04-08 DIAGNOSIS — M25561 Pain in right knee: Secondary | ICD-10-CM | POA: Diagnosis not present

## 2020-04-08 DIAGNOSIS — R262 Difficulty in walking, not elsewhere classified: Secondary | ICD-10-CM | POA: Diagnosis not present

## 2020-04-13 DIAGNOSIS — M6281 Muscle weakness (generalized): Secondary | ICD-10-CM | POA: Diagnosis not present

## 2020-04-13 DIAGNOSIS — R262 Difficulty in walking, not elsewhere classified: Secondary | ICD-10-CM | POA: Diagnosis not present

## 2020-04-13 DIAGNOSIS — M25561 Pain in right knee: Secondary | ICD-10-CM | POA: Diagnosis not present

## 2020-04-15 DIAGNOSIS — M6281 Muscle weakness (generalized): Secondary | ICD-10-CM | POA: Diagnosis not present

## 2020-04-15 DIAGNOSIS — M25561 Pain in right knee: Secondary | ICD-10-CM | POA: Diagnosis not present

## 2020-04-15 DIAGNOSIS — R262 Difficulty in walking, not elsewhere classified: Secondary | ICD-10-CM | POA: Diagnosis not present

## 2020-04-20 DIAGNOSIS — R262 Difficulty in walking, not elsewhere classified: Secondary | ICD-10-CM | POA: Diagnosis not present

## 2020-04-20 DIAGNOSIS — M6281 Muscle weakness (generalized): Secondary | ICD-10-CM | POA: Diagnosis not present

## 2020-04-20 DIAGNOSIS — M25561 Pain in right knee: Secondary | ICD-10-CM | POA: Diagnosis not present

## 2020-04-21 ENCOUNTER — Other Ambulatory Visit: Payer: Self-pay

## 2020-04-21 ENCOUNTER — Encounter: Payer: Self-pay | Admitting: Family

## 2020-04-21 ENCOUNTER — Ambulatory Visit (INDEPENDENT_AMBULATORY_CARE_PROVIDER_SITE_OTHER): Payer: Medicare Other | Admitting: Family

## 2020-04-21 ENCOUNTER — Ambulatory Visit
Admission: RE | Admit: 2020-04-21 | Discharge: 2020-04-21 | Disposition: A | Discharge status: Home / Self Care | Payer: Medicare Other | Source: Ambulatory Visit | Attending: Family | Admitting: Family

## 2020-04-21 VITALS — BP 130/86 | HR 81 | Temp 97.8°F | Resp 16 | Ht 61.0 in | Wt 163.4 lb

## 2020-04-21 DIAGNOSIS — R0602 Shortness of breath: Secondary | ICD-10-CM | POA: Diagnosis not present

## 2020-04-21 DIAGNOSIS — G8929 Other chronic pain: Secondary | ICD-10-CM

## 2020-04-21 DIAGNOSIS — M25561 Pain in right knee: Secondary | ICD-10-CM | POA: Diagnosis not present

## 2020-04-21 NOTE — Patient Instructions (Signed)
-   Please get chest X-ray done at [facility] [address] at [place]

## 2020-04-21 NOTE — Progress Notes (Signed)
Provider: Oriel Rumbold FNP-C  Gayland Curry, DO  Patient Care Team: Gayland Curry, DO as PCP - General (Geriatric Medicine) Tanda Rockers, MD as Consulting Physician (Pulmonary Disease) Deveron Furlong, NP as Nurse Practitioner (Psychiatry)  Extended Emergency Contact Information Primary Emergency Contact: Dentremont,Roberta(Bobbi) Address: [place], [place] [address] [name] of [place] Phone: [phone] Mobile Phone: [phone] Relation: Daughter Secondary Emergency Contact: Kestner,David Address: [address]           [place], [place] [address] [place] Phone: [phone] Work Phone: [phone] Relation: Son  Code Status:  DNR Goals of care: Advanced Directive information Advanced Directives 12/04/2019  Does Patient Have a Medical Advance Directive? Yes  Type of Advance Directive Out of facility DNR (pink MOST or yellow form)  Does patient want to make changes to medical advance directive? No - Patient declined  Copy of [place] in Chart? -  Would patient like information on creating a medical advance directive? -  Pre-existing out of facility DNR order (yellow form or pink MOST form) Yellow form placed in chart (order not valid for inpatient use)     Chief Complaint  Patient presents with  . Acute Visit    Ongoing breathing difficulties.    HPI:  Pt is a 85 y.o. female seen today for an acute visit for evaluation of   shortness of breath with exertion x several days.Also has a non-productive cough.she denies any worsening swelling on the legs.states was on furosemide but was discontinued.she request Albuterol inhaler to use when short of breath though denies any wheezing.states had inhaler several years ago.Has had no abrupt weight.Patient excited that she has lost weight She was treated for left cancer 03/03/2019 but chemotherapy and radiation was discontinued 10/22/2019 after she fell and  sustained fracture due to osteoporosis.Her goals of care is comfort care due to her fraility.   Right knee - complains of swelling and pain which started January 6th,2021.Knee gets hot but no redness.Has seen [facility] at [facility] seen 03/16/2020.Care giver states will call Dr.Swintech office for an appointment  For evaluation of knee pain and swelling.X-rays already done declines repeat of Knee X-ray.she is on Percocet 5-325 mg tablet every 4 hours as needed.  Would like Pure wick female catheter to use at night instead of getting up.Care giver states patient used Pure wick during hospitalization which helped so that patient doesn't soak the bed at night.Also thinks this will prevent patient from getting up and falling at night.Advised to check with medical supplies unclear if covered by her insurance then notify provider's office if script required.   Past Medical History:  Diagnosis Date  . A-fib ([facility])   . Anemia   . Anxiety   . Arthritis    hands, spine   . Bladder incontinence   . Blood transfusion   . Cancer ([facility]) 02/2019   left breast cancer  . Cervicalgia   . Constipation   . Cough    Wert-onset 08/2009, as of 2014- resolved   . Deep venous thrombosis ([facility])    post op, rec'd/needed  blood thinner   . Depression   . Depression with anxiety   . Dysrhythmia    a-fib  . Falls frequently    pt. reports that she was falling 3 times a day, has had PT at a facility for a while & now is getting home PT 2-3 times/ week   .  Headache(784.0) 09/27/2011  . History of stress test    15-20 yrs.ago   . Hypotension   . Hypothyroidism   . Lumbar radiculopathy 09/26/2011  . Pneumonia    never been in hosp. for pneumonia   . Prediabetes   . Right hand fracture    Monday  . Shortness of breath   . Sleep apnea    does not use consistently   Past Surgical History:  Procedure Laterality Date  . ANTERIOR CERVICAL DECOMP/DISCECTOMY FUSION N/A 07/09/2013   Procedure: ANTERIOR  CERVICAL DECOMPRESSION/DISCECTOMY FUSION 3 LEVELS;  Surgeon: Sinclair Ship, MD;  Location: [facility];  Service: Orthopedics;  Laterality: N/A;  Anterior cervical decompression fusion, cervical 3-4, cervical 4-5-, cervical 5-6 with instrumentation, allograft.  . APPENDECTOMY    . BACK SURGERY     2000  . BREAST EXCISIONAL BIOPSY     breast bxs on left x 2, breast bx of right x 1-all benign  . EYE SURGERY     cataracts bilateral /w IOL  . HERNIA REPAIR     umbilical hernia- [id number]  . IR IMAGING GUIDED PORT INSERTION  04/28/2019  . JOINT REPLACEMENT  2006,2007   bilateral  . LAPAROSCOPIC OVARIAN CYSTECTOMY     not done by laparscopy-abdominal incision  . ORIF PERIPROSTHETIC FRACTURE Right 07/26/2019   Procedure: OPEN REDUCTION INTERNAL FIXATION (ORIF) RIGHT FEMUR PERIPROSTHETIC FRACTURE;  Surgeon: Rod Can, MD;  Location: [facility];  Service: Orthopedics;  Laterality: Right;  . PORTACATH PLACEMENT Right 04/01/2019   Procedure: ATTEMPTED INSERTION PORT-A-CATH WITH ULTRASOUND;  Surgeon: [name], MD;  Location: [facility];  Service: General;  Laterality: Right;  . RADIOACTIVE SEED GUIDED PARTIAL MASTECTOMY WITH AXILLARY SENTINEL LYMPH NODE BIOPSY Left 04/01/2019   Procedure: LEFT BREAST PARTIAL MASTECTOMY WITH RADIOACTIVE SEED AND LEFT SENTINEL LYMPH NODE BIOPSY;  Surgeon: [name], MD;  Location: [facility];  Service: General;  Laterality: Left;  . RE-EXCISION OF BREAST CANCER,SUPERIOR MARGINS Left 04/15/2019   Procedure: RE-EXCISION OF LEFT BREAST CANCER POSITIVE MARGINS;  Surgeon: [name], MD;  Location: [facility];  Service: General;  Laterality: Left;  . REPLACEMENT TOTAL KNEE BILATERAL    . TONSILLECTOMY      Allergies  Allergen Reactions  . Clindamycin Hcl Other (See Comments)    REACTION: swelling, pt. Reports that she had a 16 lb. weightgain in one day    Outpatient Encounter Medications as of 04/21/2020    Medication Sig  . Brexpiprazole 1 MG TABS Take 1 mg by mouth at bedtime.   . busPIRone (BUSPAR) 5 MG tablet Take 5-10 mg by mouth 2 (two) times daily. 10 MG IN THE MORNING, 5 MG AT DINNER  . CALCIUM CITRATE PO Take 600 mg by mouth daily.   . clonazePAM (KLONOPIN) 0.5 MG tablet Take 1 tablet by mouth as needed in middle of the nite to go back to sleep  . CRANBERRY PO Take 2 tablets by mouth daily.  . DULoxetine (CYMBALTA) 60 MG capsule Take 1 capsule (60 mg total) by mouth daily.  [name] ELIQUIS 5 MG TABS tablet TAKE 1 TABLET BY MOUTH TWICE A DAY  . levothyroxine (SYNTHROID) 50 MCG tablet TAKE ONE TABLET BY MOUTH ONCE DAILY 30 MINUTES BEFORE BREAKFAST FOR THYROID  . lidocaine (LMX) 4 % cream Apply 1 application topically as needed (port).  . mirtazapine (REMERON) 15 MG tablet Take 7.5 mg by mouth at bedtime.  . Multiple Vitamins-Minerals (MULTIVITAMIN PO) Take 1 tablet by mouth  daily.   . oxybutynin (DITROPAN-XL) 10 MG 24 hr tablet Take 10 mg by mouth daily.  [name] oxyCODONE-acetaminophen (PERCOCET) 5-325 MG tablet Take 1 tablet by mouth every 4 (four) hours as needed for severe pain.  [name] senna-docusate (SENOKOT-S) 8.6-50 MG tablet Take 1 tablet by mouth 2 (two) times daily as needed for mild constipation.  . traZODone (DESYREL) 100 MG tablet Take 100 mg by mouth at bedtime.  . vitamin B-12 (CYANOCOBALAMIN) 500 MCG tablet Take 500 mcg by mouth daily.   No facility-administered encounter medications on file as of 04/21/2020.    Review of Systems  Constitutional: Negative for appetite change, chills, fatigue and fever.  Respiratory: Negative for cough, chest tightness and wheezing.        Shortness of breath with exertion   Cardiovascular: Positive for leg swelling. Negative for chest pain and palpitations.  Gastrointestinal: Negative for abdominal distention, abdominal pain, constipation, diarrhea, nausea and vomiting.  Genitourinary: Negative for difficulty urinating, dysuria, flank pain and urgency.   Musculoskeletal: Positive for arthralgias and gait problem.       Right knee pain   Skin: Negative for color change, pallor and rash.    Immunization History  Administered Date(s) Administered  . Influenza Split 08/14/2013  . Influenza Whole 08/20/2009  . Influenza, High Dose Seasonal PF 09/11/2017, 07/25/2018  . Influenza,inj,Quad PF,6+ Mos 08/04/2014, 09/06/2015, 07/10/2016  . Pneumococcal Conjugate-13 01/14/2015  . Pneumococcal Polysaccharide-23 07/11/2013  . Td 11/20/2005, 11/27/2016   Pertinent  Health Maintenance Due  Topic Date Due  . OPHTHALMOLOGY EXAM  01/02/2019  . FOOT EXAM  02/15/2019  . HEMOGLOBIN A1C  04/16/2019  . URINE MICROALBUMIN  10/22/2019  . INFLUENZA VACCINE  06/20/2020  . DEXA SCAN  Completed  . PNA vac Low Risk Adult  Completed   Fall Risk  12/04/2019 11/26/2019 11/25/2019 10/01/2019 08/28/2019  Falls in the past year? 1 1 0 1 1  Number falls in past yr: 0 0 0 1 1  Comment - - - - -  Injury with Fall? 0 0 0 1 1  Comment - - - - Broke right leg  Risk for fall due to : - - - - History of fall(s);Impaired balance/gait  Follow up - - - - -    There were no vitals filed for this visit. There is no height or weight on file to calculate BMI. Physical Exam Vitals reviewed.  Constitutional:      General: She is not in acute distress.    Appearance: She is obese. She is not ill-appearing.  HENT:     Head: Normocephalic.     Nose: Nose normal. No congestion or rhinorrhea.     Mouth/Throat:     Mouth: Mucous membranes are moist.     Pharynx: Oropharynx is clear. No oropharyngeal exudate or posterior oropharyngeal erythema.  Eyes:     General: No scleral icterus.       Right eye: No discharge.        Left eye: No discharge.     Extraocular Movements: Extraocular movements intact.     Conjunctiva/sclera: Conjunctivae normal.     Pupils: Pupils are equal, round, and reactive to light.  Neck:     Vascular: No carotid bruit.  Cardiovascular:     Rate and  Rhythm: Normal rate and regular rhythm.     Pulses: Normal pulses.     Heart sounds: Normal heart sounds. No murmur. No friction rub. No gallop.   Pulmonary:  Effort: Pulmonary effort is normal. No respiratory distress.     Breath sounds: Normal breath sounds. No wheezing, rhonchi or rales.  Chest:     Chest wall: No tenderness.  Abdominal:     General: Bowel sounds are normal. There is no distension.     Palpations: Abdomen is soft. There is no mass.     Tenderness: There is no abdominal tenderness. There is no right CVA tenderness, left CVA tenderness, guarding or rebound.  Musculoskeletal:        General: No tenderness.     Cervical back: Normal range of motion. No tenderness.     Right lower leg: No edema.     Left lower leg: No edema.     Comments: Unsteady gait walks with front wheel walker   Lymphadenopathy:     Cervical: No cervical adenopathy.  Skin:    General: Skin is warm.     Coloration: Skin is not pale.     Findings: No erythema or rash.  Neurological:     Mental Status: She is alert and oriented to person, place, and time.     Cranial Nerves: No cranial nerve deficit.     Motor: No weakness.     Gait: Gait abnormal.  Psychiatric:        Mood and Affect: Mood normal.        Behavior: Behavior normal.        Thought Content: Thought content normal.        Judgment: Judgment normal.    Labs reviewed: Recent Labs    07/31/19 0425 08/01/19 0630 08/02/19 0537 08/02/19 0537 08/03/19 0441 08/03/19 0441 08/05/19 1144 08/05/19 1144 08/06/19 0334 08/21/19 0000 12/18/19 1500  NA 132*   < > 134*   < > 133*   < > 135   < > 138 139 136*  K 3.8   < > 3.5   < > 4.1   < > 3.6   < > 3.5 4.4 4.1  CL 99   < > 103   < > 102   < > 101  --  102  --  101  CO2 23   < > 22   < > 21*   < > 23  --  25  --  29*  GLUCOSE 125*   < > 111*   < > 114*  --  109*  --  107*  --   --   BUN 24*   < > 24*   < > 21   < > 13   < > 14 16 12   CREATININE 0.71   < > 0.71   < > 0.64   < >  0.64   < > 0.65 0.9 0.8  CALCIUM 8.2*   < > 8.1*   < > 8.3*   < > 8.6*  --  8.7*  --  9.1  MG 1.9   < > 1.7  --  1.8  --  1.7  --   --   --   --   PHOS 2.5  --   --   --  2.7  --   --   --   --   --   --    < > = values in this interval not displayed.   Recent Labs    07/30/19 0428 07/30/19 0428 07/31/19 0425 08/05/19 1144 12/18/19 1500  AST 28   < > 24 22 11*  ALT 28   < >  26 18 5*  ALKPHOS 51  --  56 57  --   BILITOT 1.2  --  0.8 0.9  --   PROT 5.3*  --  5.4* 5.2*  --   ALBUMIN 2.8*   < > 2.8* 2.6* 2.7*   < > = values in this interval not displayed.   Recent Labs    08/04/19 0407 08/04/19 0407 08/05/19 1144 08/05/19 1144 08/06/19 0334 08/21/19 0000 12/18/19 0000  WBC 8.7   < > 9.2   < > 9.1 6.1 6.5  NEUTROABS 6.3  --  7.6  --  6.8  --   --   HGB 8.7*   < > 8.8*   < > 8.9* 11.0* 8.4*  HCT 26.4*   < > 25.8*   < > 26.4* 35* 26*  MCV 92.6  --  91.5  --  93.6  --   --   PLT 303   < > 313   < > 307 219 407*   < > = values in this interval not displayed.   Lab Results  Component Value Date   TSH 1.414 07/30/2019   Lab Results  Component Value Date   HGBA1C 5.1 10/16/2018   Lab Results  Component Value Date   CHOL 172 10/16/2018   HDL 56 10/16/2018   LDLCALC 97 10/16/2018   TRIG 98 10/16/2018   CHOLHDL 3.1 10/16/2018    Significant Diagnostic Results in last 30 days:  No results found.  Assessment/Plan 1. Shortness of breath Afebrile.No abrupt weight gain or edema.No shortness of breath noted.CTA bilateral.trace leg edema noted. - DG Chest 2 View; Future to rule out acute abnormalities.  2. Chronic pain of right knee - continue on Percocet 5-325 mg tablet one by mouth every 4 hrs as needed for pain. - follow up with Dr.Swintech Aaron Edelman for evaluation.   Family/ staff Communication: Reviewed plan of care with patient and care giver verbalized understanding.   Labs/tests ordered: None   Next Appointment: 3 months for medical management of chronic  issues.  Sandrea Hughs, NP

## 2020-04-22 ENCOUNTER — Other Ambulatory Visit: Payer: Self-pay

## 2020-04-22 ENCOUNTER — Inpatient Hospital Stay: Payer: Medicare Other | Attending: Hematology and Oncology | Admitting: Hematology and Oncology

## 2020-04-22 ENCOUNTER — Inpatient Hospital Stay: Payer: Medicare Other

## 2020-04-22 DIAGNOSIS — Z9221 Personal history of antineoplastic chemotherapy: Secondary | ICD-10-CM | POA: Diagnosis not present

## 2020-04-22 DIAGNOSIS — Z7901 Long term (current) use of anticoagulants: Secondary | ICD-10-CM | POA: Diagnosis not present

## 2020-04-22 DIAGNOSIS — Z170 Estrogen receptor positive status [ER+]: Secondary | ICD-10-CM | POA: Diagnosis not present

## 2020-04-22 DIAGNOSIS — C170 Malignant neoplasm of duodenum: Secondary | ICD-10-CM | POA: Insufficient documentation

## 2020-04-22 DIAGNOSIS — N644 Mastodynia: Secondary | ICD-10-CM | POA: Insufficient documentation

## 2020-04-22 DIAGNOSIS — Z9223 Personal history of estrogen therapy: Secondary | ICD-10-CM | POA: Diagnosis not present

## 2020-04-22 DIAGNOSIS — Z9181 History of falling: Secondary | ICD-10-CM | POA: Diagnosis not present

## 2020-04-22 DIAGNOSIS — R0602 Shortness of breath: Secondary | ICD-10-CM

## 2020-04-22 DIAGNOSIS — Z79899 Other long term (current) drug therapy: Secondary | ICD-10-CM

## 2020-04-22 DIAGNOSIS — C50412 Malignant neoplasm of upper-outer quadrant of left female breast: Secondary | ICD-10-CM | POA: Diagnosis not present

## 2020-04-22 DIAGNOSIS — Z923 Personal history of irradiation: Secondary | ICD-10-CM | POA: Diagnosis not present

## 2020-04-22 LAB — CBC WITH DIFFERENTIAL (CANCER CENTER ONLY)
Abs Immature Granulocytes: 0.02 10*3/uL (ref 0.00–0.07)
Basophils Absolute: 0.1 10*3/uL (ref 0.0–0.1)
Basophils Relative: 1 %
Eosinophils Absolute: 0.1 10*3/uL (ref 0.0–0.5)
Eosinophils Relative: 2 %
HCT: 31.7 % — ABNORMAL LOW (ref 36.0–46.0)
Hemoglobin: 9.6 g/dL — ABNORMAL LOW (ref 12.0–15.0)
Immature Granulocytes: 0 %
Lymphocytes Relative: 13 %
Lymphs Abs: 1 10*3/uL (ref 0.7–4.0)
MCH: 24.9 pg — ABNORMAL LOW (ref 26.0–34.0)
MCHC: 30.3 g/dL (ref 30.0–36.0)
MCV: 82.1 fL (ref 80.0–100.0)
Monocytes Absolute: 0.4 10*3/uL (ref 0.1–1.0)
Monocytes Relative: 5 %
Neutro Abs: 6.3 10*3/uL (ref 1.7–7.7)
Neutrophils Relative %: 79 %
Platelet Count: 313 10*3/uL (ref 150–400)
RBC: 3.86 MIL/uL — ABNORMAL LOW (ref 3.87–5.11)
RDW: 16 % — ABNORMAL HIGH (ref 11.5–15.5)
WBC Count: 7.9 10*3/uL (ref 4.0–10.5)
nRBC: 0 % (ref 0.0–0.2)

## 2020-04-22 LAB — CMP (CANCER CENTER ONLY)
ALT: 7 U/L (ref 0–44)
AST: 14 U/L — ABNORMAL LOW (ref 15–41)
Albumin: 3.2 g/dL — ABNORMAL LOW (ref 3.5–5.0)
Alkaline Phosphatase: 101 U/L (ref 38–126)
Anion gap: 9 (ref 5–15)
BUN: 18 mg/dL (ref 8–23)
CO2: 27 mmol/L (ref 22–32)
Calcium: 9.6 mg/dL (ref 8.9–10.3)
Chloride: 103 mmol/L (ref 98–111)
Creatinine: 0.84 mg/dL (ref 0.44–1.00)
GFR, Est AFR Am: >60 mL/min (ref >60)
GFR, Estimated: >60 mL/min (ref >60)
Glucose, Bld: 99 mg/dL (ref 70–99)
Potassium: 4.4 mmol/L (ref 3.5–5.1)
Sodium: 139 mmol/L (ref 135–145)
Total Bilirubin: 0.3 mg/dL (ref 0.3–1.2)
Total Protein: 6.9 g/dL (ref 6.5–8.1)

## 2020-04-22 LAB — SAMPLE TO BLOOD BANK

## 2020-04-22 MED ORDER — FUROSEMIDE 20 MG PO TABS: 20.0000 mg | ORAL_TABLET | Freq: Every day | ORAL | 0 refills | Status: DC

## 2020-04-22 MED ORDER — POTASSIUM CHLORIDE CRYS ER 20 MEQ PO TBCR: 20.0000 meq | EXTENDED_RELEASE_TABLET | Freq: Every day | ORAL | 0 refills | Status: DC

## 2020-04-22 NOTE — Progress Notes (Signed)
Patient Care Team: Gayland Curry, DO as PCP - General (Geriatric Medicine) Tanda Rockers, MD as Consulting Physician (Pulmonary Disease) Deveron Furlong, NP as Nurse Practitioner (Psychiatry)  DIAGNOSIS:  Encounter Diagnosis  Name Primary?   Malignant neoplasm of upper-outer quadrant of left breast in female, estrogen receptor positive ([facility])     SUMMARY OF ONCOLOGIC HISTORY: Oncology History  Malignant neoplasm of upper-outer quadrant of left breast in female, estrogen receptor positive ([facility])  03/03/2019 Initial Diagnosis   Left breast pain and right nipple discharge, mammogram revealed 1.4 cm left breast mass which measured 1.7 x 1 x 1.5 cm retroareolar with mild nipple retraction, no enlarged lymph nodes: Biopsy revealed grade 3 IDC with DCIS ER 95%, PR 70%, Ki-67 10%, HER-2 positive ratio 2.3 and copy #4.2, T1CN0 stage Ia clinical stage   03/12/2019 Cancer Staging   Staging form: Breast, AJCC 8th Edition - Clinical: Stage IA (cT1c, cN0, cM0, G3, ER+, PR+, HER2+) - Signed by Gardenia Phlegm, NP on 03/12/2019   04/01/2019 Surgery   Left lumpectomy [facility]): IDC with DCIS, 2.2cm, HER2 positive by FISH, ER+ (95%), PR+ (70%), Ki67 10% involved margins, 0/1 lymph nodes negative.    05/09/2019 - 07/31/2019 Chemotherapy   The patient had trastuzumab (HERCEPTIN) 750 mg in sodium chloride 0.9 % 250 mL chemo infusion, 735 mg, Intravenous,  Once, 3 of 3 cycles Administration: 750 mg (05/09/2019), 600 mg (05/30/2019), 600 mg (06/20/2019) trastuzumab-dkst (OGIVRI) 600 mg in sodium chloride 0.9 % 250 mL chemo infusion, 600 mg (100 % of original dose 600 mg), Intravenous,  Once, 1 of 14 cycles Dose modification: 600 mg (original dose 600 mg, Cycle 4, Reason: Other (see comments), Comment: Biosimilar Conversion) Administration: 600 mg (07/11/2019)  for chemotherapy treatment.    05/29/2019 -  Radiation Therapy   Adjuvant XRT     CHIEF COMPLIANT: Complaining of left breast and chest  wall pain  INTERVAL HISTORY: Stefania Goulart is a 85 year old lady who had a previous history of breast cancer after undergoing lumpectomy and radiation she went on Herceptin and anastrozole for 3 months and discontinued therapy after her performance status declined following fractures of her hip.  She has not been on any active treatment and today she came in complaining of left chest and breast pain.  This has been going on for 1 to 2 weeks.  She was also complaining of shortness of breath to minimal exertion.  She is accompanied by her caregiver today.   ALLERGIES:  is allergic to clindamycin hcl.  MEDICATIONS:  Current Outpatient Medications  Medication Sig Dispense Refill   Brexpiprazole 1 MG TABS Take 1 mg by mouth at bedtime.      busPIRone (BUSPAR) 5 MG tablet Take 5-10 mg by mouth 2 (two) times daily. 10 MG IN THE MORNING, 5 MG AT DINNER     CALCIUM CITRATE PO Take 600 mg by mouth daily.      clonazePAM (KLONOPIN) 0.5 MG tablet Take 1 tablet by mouth as needed in middle of the nite to go back to sleep 20 tablet 0   CRANBERRY PO Take 2 tablets by mouth daily.     DULoxetine (CYMBALTA) 60 MG capsule Take 1 capsule (60 mg total) by mouth daily. 180 capsule 3   ELIQUIS 5 MG TABS tablet TAKE 1 TABLET BY MOUTH TWICE A DAY 60 tablet 5   levothyroxine (SYNTHROID) 50 MCG tablet TAKE ONE TABLET BY MOUTH ONCE DAILY 30 MINUTES BEFORE BREAKFAST FOR THYROID 90 tablet  1   lidocaine (LMX) 4 % cream Apply 1 application topically as needed (port).     mirtazapine (REMERON) 15 MG tablet Take 7.5 mg by mouth at bedtime.  2   Multiple Vitamins-Minerals (MULTIVITAMIN PO) Take 1 tablet by mouth daily.      oxybutynin (DITROPAN-XL) 10 MG 24 hr tablet Take 10 mg by mouth daily.     oxyCODONE-acetaminophen (PERCOCET) 5-325 MG tablet Take 1 tablet by mouth every 4 (four) hours as needed for severe pain. 180 tablet 0   Phenazopyridine HCl (AZO TABS PO) Take 1 tablet by mouth in the morning, at  noon, and at bedtime.     senna-docusate (SENOKOT-S) 8.6-50 MG tablet Take 1 tablet by mouth 2 (two) times daily as needed for mild constipation.     traZODone (DESYREL) 100 MG tablet Take 100 mg by mouth at bedtime.     vitamin B-12 (CYANOCOBALAMIN) 500 MCG tablet Take 500 mcg by mouth daily.     No current facility-administered medications for this visit.    PHYSICAL EXAMINATION: ECOG PERFORMANCE STATUS: 1 - Symptomatic but completely ambulatory  Vitals:   04/22/20 1204  BP: (!) 110/50  Pulse: (!) 57  Resp: 17  Temp: 98.7 F (37.1 C)  SpO2: 100%   Filed Weights   04/22/20 1204  Weight: 163 lb 6.4 oz (74.1 kg)    BREAST no palpable lumps or nodules in the left breast or chest wall.  Is very tender to deep palpation.  LABORATORY DATA:  I have reviewed the data as listed CMP Latest Ref Rng & Units 04/22/2020 12/18/2019 08/21/2019  Glucose 70 - 99 mg/dL 99 - -  BUN 8 - 23 mg/dL '18 12 16  ' Creatinine 0.44 - 1.00 mg/dL 0.84 0.8 0.9  Sodium 135 - 145 mmol/L 139 136(A) 139  Potassium 3.5 - 5.1 mmol/L 4.4 4.1 4.4  Chloride 98 - 111 mmol/L 103 101 -  CO2 22 - 32 mmol/L 27 29(A) -  Calcium 8.9 - 10.3 mg/dL 9.6 9.1 -  Total Protein 6.5 - 8.1 g/dL 6.9 - -  Total Bilirubin 0.3 - 1.2 mg/dL 0.3 - -  Alkaline Phos 38 - 126 U/L 101 - -  AST 15 - 41 U/L 14(L) 11(A) -  ALT 0 - 44 U/L 7 5(A) -    Lab Results  Component Value Date   WBC 7.9 04/22/2020   HGB 9.6 (L) 04/22/2020   HCT 31.7 (L) 04/22/2020   MCV 82.1 04/22/2020   PLT 313 04/22/2020   NEUTROABS 6.3 04/22/2020    ASSESSMENT & PLAN:  Malignant neoplasm of upper-outer quadrant of left breast in female, estrogen receptor positive ([facility]) 03/03/2019:Left breast pain and right nipple discharge, mammogram revealed 1.4 cm left breast mass which measured 1.7 x 1 x 1.5 cm retroareolar with mild nipple retraction, no enlarged lymph nodes: Biopsy revealed grade 3 IDC with DCIS ER 95%, PR 70%, Ki-67 10%, HER-2 positive ratio 2.3 and  copy #4.2, T1CN0 stage Ia clinical stage  04/01/2019: Left lumpectomy:([facility]): IDC with DCIS, 2.2cm, HER2 positive by FISH, ER+ (95%), PR+ (70%), Ki67 10% involved margins, 0/1 lymph nodes negative. 04/15/2019: Resection of the inferior margin: Benign  Treatment summary: 1.Adjuvant Herceptinstarted 05/09/2019 discontinued August 2020 when she had fracture 2.Adjuvant radiation therapystarted 05/29/2019 3.Adjuvant antiestrogen therapy.:  Discontinued 10/22/2019 because of osteoporotic fracture and declining performance status ------------------------------------------------------------------------------------------------------------------------------------------ Breast pain: I do not feel any lumps or nodules in the breast.  It is extremely tender to palpation suggestive of musculoskeletal  etiology.  Chest x-ray done by her primary care physician recently did not reveal any rib fractures.  However she had fallen a week before all of the pain started.  I suspect that the pain is related to the fall.  If the pain persist for more than a month then we can perform a CT chest and a mammogram for further evaluation. I instructed them to call us.  Shortness of breath to minimal exertion: I suspect it is deconditioning.  We will perform CBC and CMP today.  Her hemoglobin is 9.6 and hence does not require any blood transfusion.  Return to clinic on an as-needed basis.    Orders Placed This Encounter  Procedures   CBC with Differential ([facility] Only)    Standing Status:   Future    Number of Occurrences:   1    Standing Expiration Date:   04/22/2021   CMP ([facility] only)    Standing Status:   Future    Number of Occurrences:   1    Standing Expiration Date:   04/22/2021   Sample to Blood Bank    Standing Status:   Future    Number of Occurrences:   1    Standing Expiration Date:   04/22/2021   The patient has a good understanding of the overall plan. she agrees with it. she  will call with any problems that may develop before the next visit here. Total time spent: 30 mins including face to face time and time spent for planning, charting and co-ordination of care   Harriette Ohara, MD 04/22/20

## 2020-04-22 NOTE — Assessment & Plan Note (Signed)
03/03/2019:Left breast pain and right nipple discharge, mammogram revealed 1.4 cm left breast mass which measured 1.7 x 1 x 1.5 cm retroareolar with mild nipple retraction, no enlarged lymph nodes: Biopsy revealed grade 3 IDC with DCIS ER 95%, PR 70%, Ki-67 10%, HER-2 positive ratio 2.3 and copy #4.2, T1CN0 stage Ia clinical stage  04/01/2019: Left lumpectomy:([facility]): IDC with DCIS, 2.2cm, HER2 positive by FISH, ER+ (95%), PR+ (70%), Ki67 10% involved margins, 0/1 lymph nodes negative. 04/15/2019: Resection of the inferior margin: Benign  Treatment summary: 1.Adjuvant Herceptinstarted 05/09/2019 discontinued August 2020 when she had fracture 2.Adjuvant radiation therapystarted 05/29/2019 3.Adjuvant antiestrogen therapy.:  Discontinued 10/22/2019 because of osteoporotic fracture and declining performance status ------------------------------------------------------------------------------------------------------------------------------------------ Breast pain:

## 2020-04-26 ENCOUNTER — Other Ambulatory Visit: Payer: Self-pay

## 2020-04-26 DIAGNOSIS — R609 Edema, unspecified: Secondary | ICD-10-CM

## 2020-04-26 DIAGNOSIS — R0602 Shortness of breath: Secondary | ICD-10-CM

## 2020-04-26 DIAGNOSIS — Z79899 Other long term (current) drug therapy: Secondary | ICD-10-CM

## 2020-04-27 DIAGNOSIS — M6281 Muscle weakness (generalized): Secondary | ICD-10-CM | POA: Diagnosis not present

## 2020-04-27 DIAGNOSIS — M25561 Pain in right knee: Secondary | ICD-10-CM | POA: Diagnosis not present

## 2020-04-27 DIAGNOSIS — R262 Difficulty in walking, not elsewhere classified: Secondary | ICD-10-CM | POA: Diagnosis not present

## 2020-04-29 DIAGNOSIS — R262 Difficulty in walking, not elsewhere classified: Secondary | ICD-10-CM | POA: Diagnosis not present

## 2020-04-29 DIAGNOSIS — M25561 Pain in right knee: Secondary | ICD-10-CM | POA: Diagnosis not present

## 2020-04-29 DIAGNOSIS — M6281 Muscle weakness (generalized): Secondary | ICD-10-CM | POA: Diagnosis not present

## 2020-05-04 DIAGNOSIS — M25561 Pain in right knee: Secondary | ICD-10-CM | POA: Diagnosis not present

## 2020-05-04 DIAGNOSIS — R262 Difficulty in walking, not elsewhere classified: Secondary | ICD-10-CM | POA: Diagnosis not present

## 2020-05-04 DIAGNOSIS — M6281 Muscle weakness (generalized): Secondary | ICD-10-CM | POA: Diagnosis not present

## 2020-05-07 ENCOUNTER — Other Ambulatory Visit: Payer: Medicare Other

## 2020-05-07 ENCOUNTER — Other Ambulatory Visit: Payer: Self-pay

## 2020-05-07 DIAGNOSIS — R609 Edema, unspecified: Secondary | ICD-10-CM

## 2020-05-07 DIAGNOSIS — R0602 Shortness of breath: Secondary | ICD-10-CM

## 2020-05-07 DIAGNOSIS — Z79899 Other long term (current) drug therapy: Secondary | ICD-10-CM

## 2020-05-07 LAB — BASIC METABOLIC PANEL
BUN/Creatinine Ratio: 39 (calc) — ABNORMAL HIGH (ref 6–22)
BUN: 30 mg/dL — ABNORMAL HIGH (ref 7–25)
CO2: 26 mmol/L (ref 20–32)
Calcium: 9.6 mg/dL (ref 8.6–10.4)
Chloride: 101 mmol/L (ref 98–110)
Creat: 0.76 mg/dL (ref 0.60–0.88)
Glucose, Bld: 117 mg/dL — ABNORMAL HIGH (ref 65–99)
Potassium: 4.2 mmol/L (ref 3.5–5.3)
Sodium: 139 mmol/L (ref 135–146)

## 2020-05-11 ENCOUNTER — Other Ambulatory Visit: Payer: Self-pay | Admitting: *Deleted

## 2020-05-11 DIAGNOSIS — Z8781 Personal history of (healed) traumatic fracture: Secondary | ICD-10-CM

## 2020-05-11 MED ORDER — OXYCODONE-ACETAMINOPHEN 5-325 MG PO TABS: 1.0000 | ORAL_TABLET | ORAL | 0 refills | Status: DC | PRN

## 2020-05-11 NOTE — Telephone Encounter (Signed)
Requested refill Pended Rx and sent to Dr. Mariea Clonts for approval Epic LR: 04/02/20

## 2020-05-26 ENCOUNTER — Other Ambulatory Visit: Payer: Self-pay

## 2020-05-26 ENCOUNTER — Encounter: Payer: Self-pay | Admitting: Critical Care Medicine

## 2020-05-26 ENCOUNTER — Ambulatory Visit (INDEPENDENT_AMBULATORY_CARE_PROVIDER_SITE_OTHER): Payer: Medicare Other | Admitting: Critical Care Medicine

## 2020-05-26 VITALS — BP 120/70 | HR 69 | Ht 61.0 in | Wt 185.8 lb

## 2020-05-26 DIAGNOSIS — Z720 Tobacco use: Secondary | ICD-10-CM | POA: Diagnosis not present

## 2020-05-26 DIAGNOSIS — Z7901 Long term (current) use of anticoagulants: Secondary | ICD-10-CM | POA: Diagnosis not present

## 2020-05-26 DIAGNOSIS — R0609 Other forms of dyspnea: Secondary | ICD-10-CM

## 2020-05-26 DIAGNOSIS — Z86711 Personal history of pulmonary embolism: Secondary | ICD-10-CM

## 2020-05-26 DIAGNOSIS — R0600 Dyspnea, unspecified: Secondary | ICD-10-CM | POA: Diagnosis not present

## 2020-05-26 MED ORDER — ALBUTEROL SULFATE HFA 108 (90 BASE) MCG/ACT IN AERS
2.0000 | INHALATION_SPRAY | Freq: Four times a day (QID) | RESPIRATORY_TRACT | 11 refills | Status: DC | PRN
Start: 2020-05-26 — End: 2023-01-17

## 2020-05-26 NOTE — Patient Instructions (Addendum)
Thank you for visiting Dr. Carlis Abbott at [facility]. We recommend the following: Orders Placed This Encounter  Procedures  . Pulmonary function test   Orders Placed This Encounter  Procedures  . Pulmonary function test    Standing Status:   Future    Standing Expiration Date:   05/26/2021    Order Specific Question:   Where should this test be performed?    Answer:   [facility]    Order Specific Question:   Full PFT: includes the following: basic spirometry, spirometry pre & post bronchodilator, diffusion capacity (DLCO), lung volumes    Answer:   Full PFT    Meds ordered this encounter  Medications  . albuterol (VENTOLIN HFA) 108 (90 Base) MCG/ACT inhaler    Sig: Inhale 2 puffs into the lungs every 6 (six) hours as needed for wheezing or shortness of breath.    Dispense:  18 g    Refill:  11    No follow-ups on file.    Please do your part to reduce the spread of COVID-19.  It is very important that you stop smoking or vaping. This is the single most important thing that you can do to improve your lung health.   S = Set a quit date. T = Tell family, friends, and the people around you that you plan to quit. A = Anticipate or plan ahead for the tough times you'll face while quitting. R = Remove cigarettes and other tobacco products from your home, car, and work. T = Talk to [place] about getting help to quit.  If you need help, please reach out to our office or the smoking cessation resources available: [place] Smoking Cessation Class: [phone] 1-800-QUIT-NOW www.BeTobaccoFree.gov

## 2020-05-26 NOTE — Progress Notes (Signed)
Synopsis: Referred in July 2021 for DOE by Mariea Clonts, Tiffany L, DO.  Previously patient of Dr. Melvyn Novas in 2014.  Subjective:   PATIENT ID: Andrea [name] GENDER: female DOB: 08-30-1935, MRN: [id number]  Chief Complaint  Patient presents with  . Consult    Patient is here for shortness of breath worse with exertion. Dry cough. Walks at home with room walker otherwise uses wheelchair for long walks. Broke leg in Sept 2020    Andrea [name] is an 86 y/o woman with a history of recurrent VTEs on chronic AC, breast cancer s/p lumpectomy and radiation, femur fracture in 2020 who presents for evaluation of DOE.  She is accompanied today by her caretaker [name] who has been caring for her for about 10 years.  Her shortness of breath began about 10 months ago around the time that she was hospitalized for her femur fracture.  She subsequently had an episode of prolonged diarrhea that followed her hospitalization.  She then had breast cancer surgery and radiation.  She only has dyspnea with exertion, never symptoms at rest.  Her breathing is episodic.  Sometimes she can do the same amount of activity without shortness of breath and other times can barely walk a few feet.  She previously had trouble every time she got up the car where she was hyperventilating, but now this has not happened in the last month.  She had an episode in her house yesterday.  There does not seem to be an association of time of day or other precipitating factors.  There is no associated chest pain, palpitations, dizziness, nausea, coughing, or wheezing.  They bought a portable home oxygen concentrator off the home shopping network called [employer] oxygen which gives 1 to 2 breaths of 95% oxygen.  This seems to help her shortness of breath.  She has never measured low saturations at home.  She continues working with physical therapy twice per week to try and improve walking since her femur fracture.  At first she thought the shortness of breath was due  to anxiety, but now thinks there is something else wrong.  She continues to smoke 1 to 2 cigarettes/day.  She is unsure how many years she smoked, and has previously quit for many years.  At the heaviest she smoked 0.5 packs/day.  She is unsure at this time if she wants to quit.  Has no known history of COPD or asthma.  She has never been prescribed inhalers but wants a rescue inhaler to try.   In the past month she has gained 20 pounds since I doctors appointment on 04/21/2020.  She previously lost about 60 pounds from her prolonged illness.  She does not weigh herself at home.  She endorses chronic lower extremity edema for which she is supposed to take Lasix, but has not due to urinary incontinence.  She is not sure if her shortness of breath correlates to her leg edema.  She denies abdominal distention or feeling as though her clothes are getting too tight.  She feels that her breathing is the same over the last month despite the weight gain.  She follows with Dr. Lindi Adie (04/22/20 note reviewed), who is concerned that her dyspnea on exertion could be related to deconditioning given everything she has been through for the past year.  Her chronic anemia is stable and does not require blood transfusion. She is on chronic Eliquis for DVTs in RLE 1971, 1976, 2012-- all unprovoked, and in 2014 after neck surgery.  Past Medical History:  Diagnosis Date  . A-fib ([place])   . Anemia   . Anxiety   . Arthritis    hands, spine   . Bladder incontinence   . Blood transfusion   . Cancer ([place]) 02/2019   left breast cancer  . Cervicalgia   . Constipation   . Cough    Wert-onset 08/2009, as of 2014- resolved   . Deep venous thrombosis ([place])    post op, rec'd/needed  blood thinner   . Depression   . Depression with anxiety   . Dysrhythmia    a-fib  . Falls frequently    pt. reports that she was falling 3 times a day, has had PT at a facility for a while & now is getting home PT 2-3 times/ week   .  Headache(784.0) 09/27/2011  . History of stress test    15-20 yrs.ago   . Hypotension   . Hypothyroidism   . Lumbar radiculopathy 09/26/2011  . Pneumonia    never been in hosp. for pneumonia   . Prediabetes   . Right hand fracture    Monday  . Shortness of breath   . Sleep apnea    does not use consistently     Family History  Problem Relation Age of Onset  . Heart disease Father   . Malignant hyperthermia Father   . Arthritis Father   . Deep vein thrombosis Son   . Diabetes Son   . Obesity Son   . Depression Son   . Hypertension Son   . Parkinson's disease Mother   . Diabetes Daughter   . Hypertension Daughter   . Anxiety disorder Daughter   . Hyperlipidemia Son   . Hypertension Son   . Cancer Maternal Aunt      Past Surgical History:  Procedure Laterality Date  . ANTERIOR CERVICAL DECOMP/DISCECTOMY FUSION N/A 07/09/2013   Procedure: ANTERIOR CERVICAL DECOMPRESSION/DISCECTOMY FUSION 3 LEVELS;  Surgeon: Sinclair Ship, MD;  Location: [facility];  Service: Orthopedics;  Laterality: N/A;  Anterior cervical decompression fusion, cervical 3-4, cervical 4-5-, cervical 5-6 with instrumentation, allograft.  . APPENDECTOMY    . BACK SURGERY     2000  . BREAST EXCISIONAL BIOPSY     breast bxs on left x 2, breast bx of right x 1-all benign  . EYE SURGERY     cataracts bilateral /w IOL  . HERNIA REPAIR     umbilical hernia- 5102  . IR IMAGING GUIDED PORT INSERTION  04/28/2019  . JOINT REPLACEMENT  2006,2007   bilateral  . LAPAROSCOPIC OVARIAN CYSTECTOMY     not done by laparscopy-abdominal incision  . ORIF PERIPROSTHETIC FRACTURE Right 07/26/2019   Procedure: OPEN REDUCTION INTERNAL FIXATION (ORIF) RIGHT FEMUR PERIPROSTHETIC FRACTURE;  Surgeon: Rod Can, MD;  Location: [facility];  Service: Orthopedics;  Laterality: Right;  . PORTACATH PLACEMENT Right 04/01/2019   Procedure: ATTEMPTED INSERTION PORT-A-CATH WITH ULTRASOUND;  Surgeon: [name], MD;  Location: [facility];  Service: General;  Laterality: Right;  . RADIOACTIVE SEED GUIDED PARTIAL MASTECTOMY WITH AXILLARY SENTINEL LYMPH NODE BIOPSY Left 04/01/2019   Procedure: LEFT BREAST PARTIAL MASTECTOMY WITH RADIOACTIVE SEED AND LEFT SENTINEL LYMPH NODE BIOPSY;  Surgeon: [name], MD;  Location: [facility];  Service: General;  Laterality: Left;  . RE-EXCISION OF BREAST CANCER,SUPERIOR MARGINS Left 04/15/2019   Procedure: RE-EXCISION OF LEFT BREAST CANCER POSITIVE MARGINS;  Surgeon: [name], MD;  Location: [facility];  Service:  General;  Laterality: Left;  . REPLACEMENT TOTAL KNEE BILATERAL    . TONSILLECTOMY      Social History   Socioeconomic History  . Marital status: Single    Spouse name: Not on file  . Number of children: 3  . Years of education: College  . Highest education level: Not on file  Occupational History  . Occupation: Retired Therapist, sports  Tobacco Use  . Smoking status: Current Some Day Smoker    Packs/day: 0.25    Years: 20.00    Pack years: 5.00    Types: Cigarettes  . Smokeless tobacco: Never Used  . Tobacco comment: 1-2 cigarettes a day  Vaping Use  . Vaping Use: Never used  Substance and Sexual Activity  . Alcohol use: Yes    Comment: Glass of wine 1-2 times a year at Christmas  . Drug use: No  . Sexual activity: Never  Other Topics Concern  . Not on file  Social History Narrative   Patient is single and lives alone.   Patient is retired.   Patient has a college education.   Patient has three adult children   Patient is right-handed.   Patient drinks 3-4 cups of caffeine (soda and coffee) daily.   Walks with walker   Social Determinants of Health   Financial Resource Strain:   . Difficulty of Paying Living Expenses:   Food Insecurity:   . Worried About Charity fundraiser in the Last Year:   . Arboriculturist in the Last Year:   Transportation Needs:   . Film/video editor (Medical):   [name] Lack of  Transportation (Non-Medical):   Physical Activity:   . Days of Exercise per Week:   . Minutes of Exercise per Session:   Stress:   . Feeling of Stress :   Social Connections:   . Frequency of Communication with Friends and Family:   . Frequency of Social Gatherings with Friends and Family:   . Attends Religious Services:   . Active Member of Clubs or Organizations:   . Attends Archivist Meetings:   [name] Marital Status:   Intimate Partner Violence:   . Fear of Current or Ex-Partner:   . Emotionally Abused:   [name] Physically Abused:   . Sexually Abused:      Allergies  Allergen Reactions  . Clindamycin Hcl Other (See Comments)    REACTION: swelling, pt. Reports that she had a 16 lb. weightgain in one day     Immunization History  Administered Date(s) Administered  . Influenza Split 08/14/2013  . Influenza Whole 08/20/2009  . Influenza, High Dose Seasonal PF 09/11/2017, 07/25/2018  . Influenza,inj,Quad PF,6+ Mos 08/04/2014, 09/06/2015, 07/10/2016  . Pneumococcal Conjugate-13 01/14/2015  . Pneumococcal Polysaccharide-23 07/11/2013  . Td 11/20/2005, 11/27/2016    Outpatient Medications Prior to Visit  Medication Sig Dispense Refill  . Brexpiprazole 1 MG TABS Take 1 mg by mouth at bedtime.     . busPIRone (BUSPAR) 5 MG tablet Take 5-10 mg by mouth 2 (two) times daily. 10 MG IN THE MORNING, 5 MG AT DINNER    . CALCIUM CITRATE PO Take 600 mg by mouth daily.     . clonazePAM (KLONOPIN) 0.5 MG tablet Take 1 tablet by mouth as needed in middle of the nite to go back to sleep 20 tablet 0  . CRANBERRY PO Take 2 tablets by mouth daily.    . DULoxetine (CYMBALTA) 60 MG capsule Take 1 capsule (60 mg  total) by mouth daily. 180 capsule 3  . ELIQUIS 5 MG TABS tablet TAKE 1 TABLET BY MOUTH TWICE A DAY 60 tablet 5  . furosemide (LASIX) 20 MG tablet Take 1 tablet (20 mg total) by mouth daily. 90 tablet 0  . levothyroxine (SYNTHROID) 50 MCG tablet TAKE ONE TABLET BY MOUTH ONCE DAILY 30  MINUTES BEFORE BREAKFAST FOR THYROID 90 tablet 1  . lidocaine (LMX) 4 % cream Apply 1 application topically as needed (port).    . mirtazapine (REMERON) 15 MG tablet Take 7.5 mg by mouth at bedtime.  2  . Multiple Vitamins-Minerals (MULTIVITAMIN PO) Take 1 tablet by mouth daily.     [name] oxybutynin (DITROPAN-XL) 10 MG 24 hr tablet Take 10 mg by mouth daily.    [name] oxyCODONE-acetaminophen (PERCOCET) 5-325 MG tablet Take 1 tablet by mouth every 4 (four) hours as needed for severe pain. 180 tablet 0  . Phenazopyridine HCl (AZO TABS PO) Take 1 tablet by mouth in the morning, at noon, and at bedtime.    . potassium chloride SA (KLOR-CON) 20 MEQ tablet Take 1 tablet (20 mEq total) by mouth daily. 90 tablet 0  . senna-docusate (SENOKOT-S) 8.6-50 MG tablet Take 1 tablet by mouth 2 (two) times daily as needed for mild constipation.    . traZODone (DESYREL) 100 MG tablet Take 100 mg by mouth at bedtime.    . vitamin B-12 (CYANOCOBALAMIN) 500 MCG tablet Take 500 mcg by mouth daily.     No facility-administered medications prior to visit.    Review of Systems  Constitutional: Negative for chills and fever.       Previous weight loss and gaining back weight  Respiratory: Positive for shortness of breath. Negative for cough.   Cardiovascular: Positive for leg swelling. Negative for chest pain and palpitations.  Gastrointestinal: Negative for heartburn, nausea and vomiting.  Genitourinary: Positive for urgency. Negative for hematuria.       Incontinence  Skin: Negative for rash.  Neurological: Negative for focal weakness.  Endo/Heme/Allergies: Negative for environmental allergies.     Objective:   Vitals:   05/26/20 1452  BP: 120/70  Pulse: 69  SpO2: 96%  Weight: 185 lb 12.8 oz (84.3 kg)  Height: 5\' 1"  (1.549 m)   96% on  RA BMI Readings from Last 3 Encounters:  05/26/20 35.11 kg/m  04/22/20 30.87 kg/m  04/21/20 30.87 kg/m   Wt Readings from Last 3 Encounters:  05/26/20 185 lb 12.8 oz  (84.3 kg)  04/22/20 163 lb 6.4 oz (74.1 kg)  04/21/20 163 lb 6.4 oz (74.1 kg)    Physical Exam Vitals reviewed.  Constitutional:      Appearance: She is obese.     Comments: Chronically ill-appearing woman sitting in a wheelchair  HENT:     Head: Normocephalic and atraumatic.  Eyes:     General: No scleral icterus. Cardiovascular:     Rate and Rhythm: Normal rate and regular rhythm.     Comments: No JVD when sitting upright. Pulmonary:     Comments: Sitting comfortably in room air, no conversational dyspnea. No observed coughing. Clear to auscultation bilaterally. Abdominal:     General: There is no distension.     Palpations: Abdomen is soft.  Musculoskeletal:        General: No deformity.     Cervical back: Neck supple.     Comments: Mild bilateral lower extremity nonpitting edema. Brace on R knee.  Lymphadenopathy:     Cervical: No cervical adenopathy.  Skin:    General: Skin is warm and dry.     Comments: Chronic venous stasis changes lower shins, R>L  Neurological:     General: No focal deficit present.     Mental Status: She is alert.     Coordination: Coordination normal.  Psychiatric:        Mood and Affect: Mood normal.        Behavior: Behavior normal.      CBC    Component Value Date/Time   WBC 7.9 04/22/2020 1240   WBC 9.1 08/06/2019 0334   RBC 3.86 (L) 04/22/2020 1240   HGB 9.6 (L) 04/22/2020 1240   HGB 13.2 09/15/2015 0816   HCT 31.7 (L) 04/22/2020 1240   HCT 40.2 09/15/2015 0816   PLT 313 04/22/2020 1240   PLT 199 09/15/2015 0816   MCV 82.1 04/22/2020 1240   MCV 89 09/15/2015 0816   MCH 24.9 (L) 04/22/2020 1240   MCHC 30.3 04/22/2020 1240   RDW 16.0 (H) 04/22/2020 1240   RDW 14.3 09/15/2015 0816   LYMPHSABS 1.0 04/22/2020 1240   LYMPHSABS 2.1 09/15/2015 0816   MONOABS 0.4 04/22/2020 1240   EOSABS 0.1 04/22/2020 1240   EOSABS 0.2 09/15/2015 0816   BASOSABS 0.1 04/22/2020 1240   BASOSABS 0.0 09/15/2015 0816    CHEMISTRY No results for  input(s): NA, K, CL, CO2, GLUCOSE, BUN, CREATININE, CALCIUM, MG, PHOS in the last 168 hours. Estimated Creatinine Clearance: 53.4 mL/min (by C-G formula based on SCr of 0.76 mg/dL).   Chest Imaging- films reviewed: CXR, 2 view 04/21/2020-right-sided Port-A-Cath in place, minimal dependent left pleural thickening versus scarring.  No silhouetting.  No lung opacities or masses.  Pulmonary Functions Testing Results: No flowsheet data found.   Echocardiogram 03/27/2019: LVEF 60 to 65%, mildly increased LV wall thickness but normal diastolic function.  Normal LA, RV, RA.  Mild PR, aortic sclerosis without stenosis, mild TR.      Assessment & Plan:     ICD-10-CM   1. Tobacco abuse  Z72.0 Pulmonary function test  2. DOE (dyspnea on exertion)  R06.00 Pulmonary function test  3. Chronic anticoagulation  Z79.01   4. History of pulmonary embolus (PE)  Z86.711     DOE- likely multifactorial from deconditioning, chronic anemia, possibly smoking related lung disease, less likely pulmonary edema. No evidence of PH on previous echocardiograms.  -PFTs -Trial of albuterol every 6 hours as needed.  We discussed the potential side effects of agitation, tremors, anxiety with albuterol and the potential to mask progressive pulmonary symptoms such as pulmonary edema symptomatic management. -Given her weight gain and shortness of breath, seems reasonable to give her Lasix her primary care physician has prescribed trying.  She is resistant to this due to urinary incontinence. -Strongly recommend smoking cessation  Chronic anticoagulation for recurrent VTEs- mostly unprovoked -Continue Eliquis twice daily indefinitely unless risk of bleeding complications supersedes her risk of recurrent VTE.  Tobacco abuse -Strongly recommend cessation; discussed the risks of ongoing tobacco use. -Outside recommended age range for lung cancer screening.  RTC in 1 to 2 months after PFTs.   Current Outpatient Medications:   .  Brexpiprazole 1 MG TABS, Take 1 mg by mouth at bedtime. , Disp: , Rfl:  .  busPIRone (BUSPAR) 5 MG tablet, Take 5-10 mg by mouth 2 (two) times daily. 10 MG IN THE MORNING, 5 MG AT DINNER, Disp: , Rfl:  .  CALCIUM CITRATE PO, Take 600 mg by mouth daily. , Disp: ,  Rfl:  .  clonazePAM (KLONOPIN) 0.5 MG tablet, Take 1 tablet by mouth as needed in middle of the nite to go back to sleep, Disp: 20 tablet, Rfl: 0 .  CRANBERRY PO, Take 2 tablets by mouth daily., Disp: , Rfl:  .  DULoxetine (CYMBALTA) 60 MG capsule, Take 1 capsule (60 mg total) by mouth daily., Disp: 180 capsule, Rfl: 3 .  ELIQUIS 5 MG TABS tablet, TAKE 1 TABLET BY MOUTH TWICE A DAY, Disp: 60 tablet, Rfl: 5 .  furosemide (LASIX) 20 MG tablet, Take 1 tablet (20 mg total) by mouth daily., Disp: 90 tablet, Rfl: 0 .  levothyroxine (SYNTHROID) 50 MCG tablet, TAKE ONE TABLET BY MOUTH ONCE DAILY 30 MINUTES BEFORE BREAKFAST FOR THYROID, Disp: 90 tablet, Rfl: 1 .  lidocaine (LMX) 4 % cream, Apply 1 application topically as needed (port)., Disp: , Rfl:  .  mirtazapine (REMERON) 15 MG tablet, Take 7.5 mg by mouth at bedtime., Disp: , Rfl: 2 .  Multiple Vitamins-Minerals (MULTIVITAMIN PO), Take 1 tablet by mouth daily. , Disp: , Rfl:  .  oxybutynin (DITROPAN-XL) 10 MG 24 hr tablet, Take 10 mg by mouth daily., Disp: , Rfl:  .  oxyCODONE-acetaminophen (PERCOCET) 5-325 MG tablet, Take 1 tablet by mouth every 4 (four) hours as needed for severe pain., Disp: 180 tablet, Rfl: 0 .  Phenazopyridine HCl (AZO TABS PO), Take 1 tablet by mouth in the morning, at noon, and at bedtime., Disp: , Rfl:  .  potassium chloride SA (KLOR-CON) 20 MEQ tablet, Take 1 tablet (20 mEq total) by mouth daily., Disp: 90 tablet, Rfl: 0 .  senna-docusate (SENOKOT-S) 8.6-50 MG tablet, Take 1 tablet by mouth 2 (two) times daily as needed for mild constipation., Disp: , Rfl:  .  traZODone (DESYREL) 100 MG tablet, Take 100 mg by mouth at bedtime., Disp: , Rfl:  .  vitamin B-12  (CYANOCOBALAMIN) 500 MCG tablet, Take 500 mcg by mouth daily., Disp: , Rfl:  .  albuterol (VENTOLIN HFA) 108 (90 Base) MCG/ACT inhaler, Inhale 2 puffs into the lungs every 6 (six) hours as needed for wheezing or shortness of breath., Disp: 18 g, Rfl: [place] Katlynne Mckercher, DO [facility] 05/26/2020 3:24 PM

## 2020-05-28 ENCOUNTER — Other Ambulatory Visit: Payer: Self-pay | Admitting: Internal Medicine

## 2020-05-28 DIAGNOSIS — E039 Hypothyroidism, unspecified: Secondary | ICD-10-CM

## 2020-06-03 DIAGNOSIS — M6281 Muscle weakness (generalized): Secondary | ICD-10-CM | POA: Diagnosis not present

## 2020-06-03 DIAGNOSIS — R262 Difficulty in walking, not elsewhere classified: Secondary | ICD-10-CM | POA: Diagnosis not present

## 2020-06-03 DIAGNOSIS — M25561 Pain in right knee: Secondary | ICD-10-CM | POA: Diagnosis not present

## 2020-06-07 DIAGNOSIS — M25561 Pain in right knee: Secondary | ICD-10-CM | POA: Diagnosis not present

## 2020-06-07 DIAGNOSIS — R262 Difficulty in walking, not elsewhere classified: Secondary | ICD-10-CM | POA: Diagnosis not present

## 2020-06-07 DIAGNOSIS — M6281 Muscle weakness (generalized): Secondary | ICD-10-CM | POA: Diagnosis not present

## 2020-06-10 DIAGNOSIS — M25561 Pain in right knee: Secondary | ICD-10-CM | POA: Diagnosis not present

## 2020-06-10 DIAGNOSIS — M6281 Muscle weakness (generalized): Secondary | ICD-10-CM | POA: Diagnosis not present

## 2020-06-10 DIAGNOSIS — R262 Difficulty in walking, not elsewhere classified: Secondary | ICD-10-CM | POA: Diagnosis not present

## 2020-06-11 DIAGNOSIS — R350 Frequency of micturition: Secondary | ICD-10-CM | POA: Diagnosis not present

## 2020-06-11 DIAGNOSIS — N3946 Mixed incontinence: Secondary | ICD-10-CM | POA: Diagnosis not present

## 2020-06-14 DIAGNOSIS — R262 Difficulty in walking, not elsewhere classified: Secondary | ICD-10-CM | POA: Diagnosis not present

## 2020-06-14 DIAGNOSIS — M6281 Muscle weakness (generalized): Secondary | ICD-10-CM | POA: Diagnosis not present

## 2020-06-14 DIAGNOSIS — M25561 Pain in right knee: Secondary | ICD-10-CM | POA: Diagnosis not present

## 2020-06-17 DIAGNOSIS — M6281 Muscle weakness (generalized): Secondary | ICD-10-CM | POA: Diagnosis not present

## 2020-06-17 DIAGNOSIS — M25561 Pain in right knee: Secondary | ICD-10-CM | POA: Diagnosis not present

## 2020-06-17 DIAGNOSIS — R262 Difficulty in walking, not elsewhere classified: Secondary | ICD-10-CM | POA: Diagnosis not present

## 2020-06-21 DIAGNOSIS — R262 Difficulty in walking, not elsewhere classified: Secondary | ICD-10-CM | POA: Diagnosis not present

## 2020-06-21 DIAGNOSIS — M25561 Pain in right knee: Secondary | ICD-10-CM | POA: Diagnosis not present

## 2020-06-21 DIAGNOSIS — M6281 Muscle weakness (generalized): Secondary | ICD-10-CM | POA: Diagnosis not present

## 2020-06-25 DIAGNOSIS — B351 Tinea unguium: Secondary | ICD-10-CM | POA: Diagnosis not present

## 2020-06-25 DIAGNOSIS — M25561 Pain in right knee: Secondary | ICD-10-CM | POA: Diagnosis not present

## 2020-06-25 DIAGNOSIS — M6281 Muscle weakness (generalized): Secondary | ICD-10-CM | POA: Diagnosis not present

## 2020-06-25 DIAGNOSIS — L600 Ingrowing nail: Secondary | ICD-10-CM | POA: Diagnosis not present

## 2020-06-25 DIAGNOSIS — R262 Difficulty in walking, not elsewhere classified: Secondary | ICD-10-CM | POA: Diagnosis not present

## 2020-06-28 DIAGNOSIS — M25561 Pain in right knee: Secondary | ICD-10-CM | POA: Diagnosis not present

## 2020-06-28 DIAGNOSIS — M6281 Muscle weakness (generalized): Secondary | ICD-10-CM | POA: Diagnosis not present

## 2020-06-28 DIAGNOSIS — R262 Difficulty in walking, not elsewhere classified: Secondary | ICD-10-CM | POA: Diagnosis not present

## 2020-07-07 DIAGNOSIS — M6281 Muscle weakness (generalized): Secondary | ICD-10-CM | POA: Diagnosis not present

## 2020-07-07 DIAGNOSIS — M25561 Pain in right knee: Secondary | ICD-10-CM | POA: Diagnosis not present

## 2020-07-07 DIAGNOSIS — R262 Difficulty in walking, not elsewhere classified: Secondary | ICD-10-CM | POA: Diagnosis not present

## 2020-07-14 DIAGNOSIS — M25561 Pain in right knee: Secondary | ICD-10-CM | POA: Diagnosis not present

## 2020-07-14 DIAGNOSIS — R262 Difficulty in walking, not elsewhere classified: Secondary | ICD-10-CM | POA: Diagnosis not present

## 2020-07-14 DIAGNOSIS — M6281 Muscle weakness (generalized): Secondary | ICD-10-CM | POA: Diagnosis not present

## 2020-07-21 DIAGNOSIS — M6281 Muscle weakness (generalized): Secondary | ICD-10-CM | POA: Diagnosis not present

## 2020-07-21 DIAGNOSIS — M25561 Pain in right knee: Secondary | ICD-10-CM | POA: Diagnosis not present

## 2020-07-21 DIAGNOSIS — R262 Difficulty in walking, not elsewhere classified: Secondary | ICD-10-CM | POA: Diagnosis not present

## 2020-07-22 ENCOUNTER — Encounter: Payer: Self-pay | Admitting: Internal Medicine

## 2020-07-22 ENCOUNTER — Ambulatory Visit (INDEPENDENT_AMBULATORY_CARE_PROVIDER_SITE_OTHER): Payer: Medicare Other | Admitting: Internal Medicine

## 2020-07-22 ENCOUNTER — Other Ambulatory Visit: Payer: Self-pay

## 2020-07-22 VITALS — BP 118/62 | HR 63 | Temp 97.3°F | Ht 61.0 in | Wt 185.0 lb

## 2020-07-22 DIAGNOSIS — Z716 Tobacco abuse counseling: Secondary | ICD-10-CM | POA: Diagnosis not present

## 2020-07-22 DIAGNOSIS — E119 Type 2 diabetes mellitus without complications: Secondary | ICD-10-CM | POA: Diagnosis not present

## 2020-07-22 DIAGNOSIS — M7052 Other bursitis of knee, left knee: Secondary | ICD-10-CM

## 2020-07-22 DIAGNOSIS — I480 Paroxysmal atrial fibrillation: Secondary | ICD-10-CM | POA: Diagnosis not present

## 2020-07-22 DIAGNOSIS — N644 Mastodynia: Secondary | ICD-10-CM | POA: Diagnosis not present

## 2020-07-22 DIAGNOSIS — Z853 Personal history of malignant neoplasm of breast: Secondary | ICD-10-CM | POA: Diagnosis not present

## 2020-07-22 DIAGNOSIS — Z8781 Personal history of (healed) traumatic fracture: Secondary | ICD-10-CM

## 2020-07-22 DIAGNOSIS — Z23 Encounter for immunization: Secondary | ICD-10-CM

## 2020-07-22 MED ORDER — OXYCODONE-ACETAMINOPHEN 5-325 MG PO TABS: 1.0000 | ORAL_TABLET | ORAL | 0 refills | Status: DC | PRN

## 2020-07-22 NOTE — Progress Notes (Signed)
Location:  [facility] clinic Provider:  Mak Bonny L. Mariea Clonts, D.O., C.M.D.  Code Status: DNR Goals of Care:  Advanced Directives 07/22/2020  Does Patient Have a Medical Advance Directive? Yes  Type of Advance Directive Out of facility DNR (pink MOST or yellow form)  Does patient want to make changes to medical advance directive? No - Patient declined  Copy of [place] in Chart? -  Would patient like information on creating a medical advance directive? -  Pre-existing out of facility DNR order (yellow form or pink MOST form) Pink MOST/Yellow Form most recent copy in chart - Physician notified to receive inpatient order     Chief Complaint  Patient presents with  . Medical Management of Chronic Issues    3 month follow up   . Health Maintenance    Influenza, foot exam, urine microalbumin, A1c    HPI: Patient is a 85 y.o. female seen today for medical management of chronic diseases.    Snoodle dog chewed up her one hearing aid.  One is done for and one got fixed.    Has been getting therapy.  Was making progress until a week ago.  She skipped her exercises with visitors.  She's having a lot of knee pain.  Needs pain pills to be on hand.  Tylenol is not cutting it when it's bad.    They kept going back to her surgeon for red, painful knee.  Then they went to [place] who said she had bursitis.  She's using a brace on the medial aspect of her knee.  Using the voltaren gel.  She had radiation and then was in the middle of some chemo.  Her leg crumbled (periprosthetic hip fx) from it and they opted to stop her chemo.  She has stopped the treatments.    She has been back to see him once due to some pain a couple of months ago and she was told she had scar tissue.  Left breast hurt a week ago--she thinks she wants another mammogram for fear it's come back.  She has not told her daughter about the increased pain more.  Memory has gotten worse through chemo, hip fx.  She has  another caregiver also.    eliquis is suddently expensive $130 instead of 5--? Donut hole  Unable to do urine microalbumin today as urinated just before leaving.    Past Medical History:  Diagnosis Date  . A-fib ([place])   . Anemia   . Anxiety   . Arthritis    hands, spine   . Bladder incontinence   . Blood transfusion   . Cancer ([place]) 02/2019   left breast cancer  . Cervicalgia   . Constipation   . Cough    [place]-onset 08/2009, as of 2014- resolved   . Deep venous thrombosis ([place])    post op, rec'd/needed  blood thinner   . Depression   . Depression with anxiety   . Dysrhythmia    a-fib  . Falls frequently    pt. reports that she was falling 3 times a day, has had PT at a facility for a while & now is getting home PT 2-3 times/ week   . Headache(784.0) 09/27/2011  . History of stress test    15-20 yrs.ago   . Hypotension   . Hypothyroidism   . Lumbar radiculopathy 09/26/2011  . Pneumonia    never been in hosp. for pneumonia   . Prediabetes   . Right  hand fracture    Monday  . Shortness of breath   . Sleep apnea    does not use consistently    Past Surgical History:  Procedure Laterality Date  . ANTERIOR CERVICAL DECOMP/DISCECTOMY FUSION N/A 07/09/2013   Procedure: ANTERIOR CERVICAL DECOMPRESSION/DISCECTOMY FUSION 3 LEVELS;  Surgeon: Sinclair Ship, MD;  Location: [place];  Service: Orthopedics;  Laterality: N/A;  Anterior cervical decompression fusion, cervical 3-4, cervical 4-5-, cervical 5-6 with instrumentation, allograft.  . APPENDECTOMY    . BACK SURGERY     2000  . BREAST EXCISIONAL BIOPSY     breast bxs on left x 2, breast bx of right x 1-all benign  . EYE SURGERY     cataracts bilateral /w IOL  . HERNIA REPAIR     umbilical hernia- 6213  . IR IMAGING GUIDED PORT INSERTION  04/28/2019  . JOINT REPLACEMENT  2006,2007   bilateral  . LAPAROSCOPIC OVARIAN CYSTECTOMY     not done by laparscopy-abdominal incision  . LEG SURGERY    . ORIF PERIPROSTHETIC  FRACTURE Right 07/26/2019   Procedure: OPEN REDUCTION INTERNAL FIXATION (ORIF) RIGHT FEMUR PERIPROSTHETIC FRACTURE;  Surgeon: Rod Can, MD;  Location: [place];  Service: Orthopedics;  Laterality: Right;  . PORTACATH PLACEMENT Right 04/01/2019   Procedure: ATTEMPTED INSERTION PORT-A-CATH WITH ULTRASOUND;  Surgeon: [name], MD;  Location: [place];  Service: General;  Laterality: Right;  . RADIOACTIVE SEED GUIDED PARTIAL MASTECTOMY WITH AXILLARY SENTINEL LYMPH NODE BIOPSY Left 04/01/2019   Procedure: LEFT BREAST PARTIAL MASTECTOMY WITH RADIOACTIVE SEED AND LEFT SENTINEL LYMPH NODE BIOPSY;  Surgeon: [name], MD;  Location: [place];  Service: General;  Laterality: Left;  . RE-EXCISION OF BREAST CANCER,SUPERIOR MARGINS Left 04/15/2019   Procedure: RE-EXCISION OF LEFT BREAST CANCER POSITIVE MARGINS;  Surgeon: [name], MD;  Location: [place];  Service: General;  Laterality: Left;  . REPLACEMENT TOTAL KNEE BILATERAL    . TONSILLECTOMY      Allergies  Allergen Reactions  . Clindamycin Hcl Other (See Comments)    REACTION: swelling, pt. Reports that she had a 16 lb. weightgain in one day    Outpatient Encounter Medications as of 07/22/2020  Medication Sig  . albuterol (VENTOLIN HFA) 108 (90 Base) MCG/ACT inhaler Inhale 2 puffs into the lungs every 6 (six) hours as needed for wheezing or shortness of breath.  . Brexpiprazole 1 MG TABS Take 1 mg by mouth at bedtime.   . busPIRone (BUSPAR) 5 MG tablet Take 5-10 mg by mouth 2 (two) times daily. 10 MG IN THE MORNING, 5 MG AT DINNER  . CALCIUM CITRATE PO Take 600 mg by mouth daily.   . clonazePAM (KLONOPIN) 0.5 MG tablet Take 1 tablet by mouth as needed in middle of the nite to go back to sleep  . CRANBERRY PO Take 2 tablets by mouth daily.  . D-Mannose 500 MG CAPS Take by mouth daily.  . diclofenac Sodium (VOLTAREN) 1 % GEL Apply 4 g topically. 2 to 3 times daily  .  DULoxetine (CYMBALTA) 60 MG capsule Take 1 capsule (60 mg total) by mouth daily.  [name] ELIQUIS 5 MG TABS tablet TAKE 1 TABLET BY MOUTH TWICE A DAY  . furosemide (LASIX) 20 MG tablet Take 1 tablet (20 mg total) by mouth daily.  [name] levothyroxine (SYNTHROID) 50 MCG tablet TAKE ONE TABLET BY MOUTH ONCE DAILY 30 MINUTES BEFORE BREAKFAST FOR THYROID  . mirtazapine (REMERON) 15 MG tablet Take 7.5  mg by mouth at bedtime.  . Multiple Vitamins-Minerals (MULTIVITAMIN PO) Take 1 tablet by mouth daily.   [name] oxybutynin (DITROPAN-XL) 10 MG 24 hr tablet Take 10 mg by mouth daily.  [name] oxyCODONE-acetaminophen (PERCOCET) 5-325 MG tablet Take 1 tablet by mouth every 4 (four) hours as needed for severe pain.  . potassium chloride SA (KLOR-CON) 20 MEQ tablet Take 1 tablet (20 mEq total) by mouth daily.  [name] senna-docusate (SENOKOT-S) 8.6-50 MG tablet Take 1 tablet by mouth 2 (two) times daily as needed for mild constipation.  . traZODone (DESYREL) 100 MG tablet Take 100 mg by mouth at bedtime.  . vitamin B-12 (CYANOCOBALAMIN) 500 MCG tablet Take 500 mcg by mouth daily.  . [DISCONTINUED] oxyCODONE-acetaminophen (PERCOCET) 5-325 MG tablet Take 1 tablet by mouth every 4 (four) hours as needed for severe pain.  . [DISCONTINUED] lidocaine (LMX) 4 % cream Apply 1 application topically as needed (port).  . [DISCONTINUED] Phenazopyridine HCl (AZO TABS PO) Take 1 tablet by mouth in the morning, at noon, and at bedtime.   No facility-administered encounter medications on file as of 07/22/2020.    Review of Systems:  Review of Systems  Constitutional: Positive for malaise/fatigue. Negative for chills and fever.  HENT: Positive for hearing loss.   Eyes: Negative for blurred vision.  Respiratory: Negative for cough and shortness of breath.        Still smoking 2-3 cigarettes per day  Cardiovascular: Negative for chest pain, palpitations and leg swelling.       Left breast pain  Gastrointestinal: Negative for abdominal pain,  constipation and diarrhea.  Genitourinary: Negative for dysuria.  Musculoskeletal: Positive for joint pain. Negative for falls.  Skin: Negative for itching and rash.  Neurological: Positive for weakness. Negative for dizziness and loss of consciousness.  Endo/Heme/Allergies: Bruises/bleeds easily.  Psychiatric/Behavioral: Positive for memory loss. Negative for depression. The patient is not nervous/anxious and does not have insomnia.     Health Maintenance  Topic Date Due  . HEMOGLOBIN A1C  04/16/2019  . URINE MICROALBUMIN  10/22/2019  . OPHTHALMOLOGY EXAM  02/16/2021  . FOOT EXAM  07/22/2021  . TETANUS/TDAP  11/27/2026  . INFLUENZA VACCINE  Completed  . DEXA SCAN  Completed  . COVID-19 Vaccine  Completed  . PNA vac Low Risk Adult  Completed    Physical Exam: Vitals:   07/22/20 1044  BP: 118/62  Pulse: 63  Temp: (!) 97.3 F (36.3 C)  SpO2: 97%  Weight: 185 lb (83.9 kg)  Height: 5\' 1"  (1.549 m)   Body mass index is 34.96 kg/m. Physical Exam Vitals reviewed.  Constitutional:      General: She is not in acute distress.    Appearance: Normal appearance. She is not toxic-appearing.     Comments: Moving very slowly with walker  HENT:     Head: Normocephalic and atraumatic.  Eyes:     Comments: glasses  Cardiovascular:     Rate and Rhythm: Rhythm irregular.     Heart sounds: No murmur heard.   Pulmonary:     Effort: Pulmonary effort is normal.     Breath sounds: Normal breath sounds. No wheezing, rhonchi or rales.  Chest:     Comments: Left upper outer quadrant of breast is tender and laterally toward axilla but I do not feel a mass or nodule Abdominal:     General: Bowel sounds are normal.     Palpations: Abdomen is soft.     Tenderness: There is no abdominal tenderness.  Musculoskeletal:     Comments: Wearing brace on right knee, still tender medially, walking VERY slowly with walker and caregiver  Skin:    General: Skin is warm and dry.     Coloration: Skin  is pale.  Neurological:     Mental Status: She is alert.     Motor: Weakness present.     Gait: Gait abnormal.     Comments: Short-term memory is considerably worse   Psychiatric:        Mood and Affect: Mood normal.     Labs reviewed: Basic Metabolic Panel: Recent Labs    07/30/19 0428 07/30/19 1438 07/31/19 0425 08/01/19 0630 08/02/19 0537 08/02/19 0537 08/03/19 0441 08/03/19 0441 08/05/19 1144 08/05/19 1144 08/06/19 0334 08/21/19 0000 12/18/19 1500 04/22/20 1240 05/07/20 0956  NA   < >  --  132*   < > 134*   < > 133*   < > 135   < > 138   < > 136* 139 139  K   < >  --  3.8   < > 3.5   < > 4.1   < > 3.6   < > 3.5   < > 4.1 4.4 4.2  CL   < >  --  99   < > 103   < > 102   < > 101   < > 102  --  101 103 101  CO2   < >  --  23   < > 22   < > 21*   < > 23   < > 25  --  29* 27 26  GLUCOSE   < >  --  125*   < > 111*   < > 114*   < > 109*   < > 107*  --   --  99 117*  BUN   < >  --  24*   < > 24*   < > 21   < > 13   < > 14   < > 12 18 30*  CREATININE   < >  --  0.71   < > 0.71   < > 0.64   < > 0.64   < > 0.65   < > 0.8 0.84 0.76  CALCIUM   < >  --  8.2*   < > 8.1*   < > 8.3*   < > 8.6*   < > 8.7*  --  9.1 9.6 9.6  MG  --   --  1.9   < > 1.7  --  1.8  --  1.7  --   --   --   --   --   --   PHOS  --   --  2.5  --   --   --  2.7  --   --   --   --   --   --   --   --   TSH  --  1.414  --   --   --   --   --   --   --   --   --   --   --   --   --    < > = values in this interval not displayed.   Liver Function Tests: Recent Labs    07/31/19 0425 07/31/19 0425 08/05/19 1144 12/18/19 1500 04/22/20 1240  AST 24   < > 22 11* 14*  ALT  26   < > 18 5* 7  ALKPHOS 56  --  57  --  101  BILITOT 0.8  --  0.9  --  0.3  PROT 5.4*  --  5.2*  --  6.9  ALBUMIN 2.8*   < > 2.6* 2.7* 3.2*   < > = values in this interval not displayed.   No results for input(s): LIPASE, AMYLASE in the last 8760 hours. No results for input(s): AMMONIA in the last 8760 hours. CBC: Recent Labs     08/05/19 1144 08/05/19 1144 08/06/19 0334 08/06/19 0334 08/21/19 0000 12/18/19 0000 04/22/20 1240  WBC 9.2   < > 9.1  --  6.1 6.5 7.9  NEUTROABS 7.6  --  6.8  --   --   --  6.3  HGB 8.8*   < > 8.9*   < > 11.0* 8.4* 9.6*  HCT 25.8*   < > 26.4*   < > 35* 26* 31.7*  MCV 91.5  --  93.6  --   --   --  82.1  PLT 313   < > 307   < > 219 407* 313   < > = values in this interval not displayed.   Lipid Panel: No results for input(s): CHOL, HDL, LDLCALC, TRIG, CHOLHDL, LDLDIRECT in the last 8760 hours. Lab Results  Component Value Date   HGBA1C 5.1 10/16/2018    Assessment/Plan 1. Breast pain, left - worse over past week -pt requesting mammogram, but she was no longer tolerating chemo or antiestrogens, had prior XRT so I'm not sure what would be done for her at this point--discussed this with her and we decided to check with Dr. Lindi Adie who agreed that comfort measures were more appropriate--we will be reaching back out to [facility] to relay this due to her function remaining poor - CBC with Differential/Platelet; Future  2. History of breast cancer - as in #1 - CBC with Differential/Platelet; Future - Hemoglobin A1c; Future  3. Status post closed fracture of right femur - is still recovering slowly, skipped her therapy for a week and has declined again considerably afterward - oxyCODONE-acetaminophen (PERCOCET) 5-325 MG tablet; Take 1 tablet by mouth every 4 (four) hours as needed for severe pain.  Dispense: 180 tablet; Refill: ) was renewed for her right knee bursitis and prior hip fx  4. Need for influenza vaccination - Flu Vaccine QUAD High Dose(Fluad)  given  5. Bursitis of left knee, unspecified bursa -cont voltaren gel, percocet when severe  6. Tobacco abuse counseling -counseled on smoking cessation, but she reports it's her one vice and she's not ready to stop again  7. Paroxysmal A-fib ([facility]) - continues on NOAC and rate is controlled - CBC with Differential/Platelet;  Future  8. Type 2 diabetes mellitus without complication, without long-term current use of insulin ([place]) -overdue for recheck hba1c, could not do urine micro due to just emptying bladder before coming here  Lab Results  Component Value Date   HGBA1C 5.1 10/16/2018   - Hemoglobin Q0H - Basic metabolic panel - COMPLETE METABOLIC PANEL WITH GFR; Future - Hemoglobin A1c; Future   Labs/tests ordered:   Lab Orders     Hemoglobin A1c     Basic metabolic panel     CBC with Differential/Platelet     COMPLETE METABOLIC PANEL WITH GFR     Hemoglobin A1c  Next appt:  F/u 4 mos med mgt, fasting labs before (left w/o making)  Azula Zappia L. Allona Gondek, D.O.  [facility] [address] [place], [place] [address] Cell Phone (Mon-Fri 8am-5pm):  [phone] On Call:  [phone] & follow prompts after 5pm & weekends Office Phone:  [phone] Office Fax:  [phone]

## 2020-07-23 DIAGNOSIS — H18519 Endothelial corneal dystrophy, unspecified eye: Secondary | ICD-10-CM | POA: Diagnosis not present

## 2020-07-23 LAB — BASIC METABOLIC PANEL
BUN: 22 mg/dL (ref 7–25)
CO2: 26 mmol/L (ref 20–32)
Calcium: 9.1 mg/dL (ref 8.6–10.4)
Chloride: 102 mmol/L (ref 98–110)
Creat: 0.81 mg/dL (ref 0.60–0.88)
Glucose, Bld: 94 mg/dL (ref 65–139)
Potassium: 4.2 mmol/L (ref 3.5–5.3)
Sodium: 139 mmol/L (ref 135–146)

## 2020-07-23 LAB — HEMOGLOBIN A1C
Hgb A1c MFr Bld: 5.2 % of total Hgb (ref <5.7)
Mean Plasma Glucose: 103 (calc)
eAG (mmol/L): 5.7 (calc)

## 2020-07-23 NOTE — Progress Notes (Signed)
Sugar average remains normal.   Electrolytes and kidneys look fine. See message in your staff messages about Dr. Geralyn Flash response to mammogram question.  Please call her about that today.

## 2020-07-25 ENCOUNTER — Other Ambulatory Visit: Payer: Self-pay | Admitting: Family

## 2020-07-25 DIAGNOSIS — Z79899 Other long term (current) drug therapy: Secondary | ICD-10-CM

## 2020-07-28 ENCOUNTER — Ambulatory Visit (INDEPENDENT_AMBULATORY_CARE_PROVIDER_SITE_OTHER): Payer: Medicare Other | Admitting: Critical Care Medicine

## 2020-07-28 ENCOUNTER — Other Ambulatory Visit: Payer: Self-pay

## 2020-07-28 ENCOUNTER — Encounter: Payer: Self-pay | Admitting: Critical Care Medicine

## 2020-07-28 VITALS — BP 106/60 | HR 67 | Temp 97.3°F | Ht 62.0 in | Wt 190.0 lb

## 2020-07-28 DIAGNOSIS — R0609 Other forms of dyspnea: Secondary | ICD-10-CM

## 2020-07-28 DIAGNOSIS — Z720 Tobacco use: Secondary | ICD-10-CM | POA: Diagnosis not present

## 2020-07-28 DIAGNOSIS — R0600 Dyspnea, unspecified: Secondary | ICD-10-CM | POA: Diagnosis not present

## 2020-07-28 DIAGNOSIS — R5381 Other malaise: Secondary | ICD-10-CM

## 2020-07-28 LAB — PULMONARY FUNCTION TEST
FEF 25-75 Post: 2.66 L/sec
FEF 25-75 Pre: 1.81 L/sec
FEF2575-%Change-Post: 46 %
FEF2575-%Pred-Post: 233 %
FEF2575-%Pred-Pre: 159 %
FEV1-%Change-Post: 11 %
FEV1-%Pred-Post: 133 %
FEV1-%Pred-Pre: 120 %
FEV1-Post: 2.21 L
FEV1-Pre: 1.99 L
FEV1FVC-%Change-Post: 3 %
FEV1FVC-%Pred-Pre: 104 %
FEV6-%Change-Post: 7 %
FEV6-%Pred-Post: 132 %
FEV6-%Pred-Pre: 122 %
FEV6-Post: 2.78 L
FEV6-Pre: 2.58 L
FEV6FVC-%Pred-Post: 106 %
FEV6FVC-%Pred-Pre: 106 %
FVC-%Change-Post: 7 %
FVC-%Pred-Post: 124 %
FVC-%Pred-Pre: 116 %
FVC-Post: 2.78 L
FVC-Pre: 2.6 L
Post FEV1/FVC ratio: 79 %
Post FEV6/FVC ratio: 100 %
Pre FEV1/FVC ratio: 77 %
Pre FEV6/FVC Ratio: 100 %

## 2020-07-28 NOTE — Patient Instructions (Addendum)
Thank you for visiting Dr. Carlis Abbott at [facility]. We recommend the following:  Keep using albuterol as needed with activity.  Glad you got your flu shot already.    Return in about 6 months (around 01/25/2021). with Dr. Erin Fulling.    Please do your part to reduce the spread of COVID-19.

## 2020-07-28 NOTE — Progress Notes (Signed)
Spirometry pre and post performed today. 

## 2020-07-28 NOTE — Progress Notes (Signed)
Synopsis: Referred in July 2021 for DOE by Andrea [name], Andrea L, DO.  Previously patient of Dr. Melvyn [name] in 2014.  Subjective:   PATIENT ID: Andrea [name] GENDER: female DOB: 1935/05/14, MRN: [id number]  Chief Complaint  Patient presents with  . Follow-up    Did PFT today. Patient is doing better since last visit    Andrea [name] is an 85 year old woman who presents for follow-up. She has a history of breast cancer and hip fracture, prompting stopping of anti-estrogen therapy.  She is accompanied today by her caregiver.  At her last visit she was tried on albuterol as needed, which she only ever needs with activity.  She never shortness of breath with rest.  She has used albuterol a few times a symptom benefit.  She is not when she is short of breath walking 3 feet into her house.  Her daughter is continue to try to get her to use cans of oxygen that she has bought on [employer], but the patient notices more benefit from albuterol than the cans of oxygen.  PCP note reviewed from 07/22/2020.  DNR on [place] MOST form signed. She has been recommended for palliative care by PCP and oncology given her intolerance from breast cancer treatment.    OV 05/26/20: Andrea [name] is an 85 y/o woman with a history of recurrent VTEs on chronic AC, breast cancer s/p lumpectomy and radiation, femur fracture in 2020 who presents for evaluation of DOE.  She is accompanied today by her caretaker Andrea [name] who has been caring for her for about 10 years.  Her shortness of breath began about 10 months ago around the time that she was hospitalized for her femur fracture.  She subsequently had an episode of prolonged diarrhea that followed her hospitalization.  She then had breast cancer surgery and radiation.  She only has dyspnea with exertion, never symptoms at rest.  Her breathing is episodic.  Sometimes she can do the same amount of activity without shortness of breath and other times can barely walk a few feet.  She previously had trouble every  time she got up the car where she was hyperventilating, but now this has not happened in the last month.  She had an episode in her house yesterday.  There does not seem to be an association of time of day or other precipitating factors.  There is no associated chest pain, palpitations, dizziness, nausea, coughing, or wheezing.  They bought a portable home oxygen concentrator off the home shopping network called [employer] oxygen which gives 1 to 2 breaths of 95% oxygen.  This seems to help her shortness of breath.  She has never measured low saturations at home.  She continues working with physical therapy twice per week to try and improve walking since her femur fracture.  At first she thought the shortness of breath was due to anxiety, but now thinks there is something else wrong.  She continues to smoke 1 to 2 cigarettes/day.  She is unsure how many years she smoked, and has previously quit for many years.  At the heaviest she smoked 0.5 packs/day.  She is unsure at this time if she wants to quit.  Has no known history of COPD or asthma.  She has never been prescribed inhalers but wants a rescue inhaler to try.   In the past month she has gained 20 pounds since I doctors appointment on 04/21/2020.  She previously lost about 60 pounds from her prolonged illness.  She does  not weigh herself at home.  She endorses chronic lower extremity edema for which she is supposed to take Lasix, but has not due to urinary incontinence.  She is not sure if her shortness of breath correlates to her leg edema.  She denies abdominal distention or feeling as though her clothes are getting too tight.  She feels that her breathing is the same over the last month despite the weight gain.  She follows with Andrea [name] (04/22/20 note reviewed), who is concerned that her dyspnea on exertion could be related to deconditioning given everything she has been through for the past year.  Her chronic anemia is stable and does not require blood  transfusion. She is on chronic Eliquis for DVTs in RLE 1971, 1976, 2012-- all unprovoked, and in 2014 after neck surgery.   Past Medical History:  Diagnosis Date  . A-fib ([place])   . Anemia   . Anxiety   . Arthritis    hands, spine   . Bladder incontinence   . Blood transfusion   . Cancer ([place]) 02/2019   left breast cancer  . Cervicalgia   . Constipation   . Cough    Wert-onset 08/2009, as of 2014- resolved   . Deep venous thrombosis ([place])    post op, rec'd/needed  blood thinner   . Depression   . Depression with anxiety   . Dysrhythmia    a-fib  . Falls frequently    pt. reports that she was falling 3 times a day, has had PT at a facility for a while & now is getting home PT 2-3 times/ week   . Headache(784.0) 09/27/2011  . History of stress test    15-20 yrs.ago   . Hypotension   . Hypothyroidism   . Lumbar radiculopathy 09/26/2011  . Pneumonia    never been in hosp. for pneumonia   . Prediabetes   . Right hand fracture    Monday  . Shortness of breath   . Sleep apnea    does not use consistently     Family History  Problem Relation Age of Onset  . Heart disease Father   . Malignant hyperthermia Father   . Arthritis Father   . Deep vein thrombosis Son   . Diabetes Son   . Obesity Son   . Depression Son   . Hypertension Son   . Parkinson's disease Mother   . Diabetes Daughter   . Hypertension Daughter   . Anxiety disorder Daughter   . Hyperlipidemia Son   . Hypertension Son   . Cancer Maternal Aunt      Past Surgical History:  Procedure Laterality Date  . ANTERIOR CERVICAL DECOMP/DISCECTOMY FUSION N/A 07/09/2013   Procedure: ANTERIOR CERVICAL DECOMPRESSION/DISCECTOMY FUSION 3 LEVELS;  Surgeon: Sinclair Ship, MD;  Location: [facility];  Service: Orthopedics;  Laterality: N/A;  Anterior cervical decompression fusion, cervical 3-4, cervical 4-5-, cervical 5-6 with instrumentation, allograft.  . APPENDECTOMY    . BACK SURGERY     2000  . BREAST  EXCISIONAL BIOPSY     breast bxs on left x 2, breast bx of right x 1-all benign  . EYE SURGERY     cataracts bilateral /w IOL  . HERNIA REPAIR     umbilical hernia- 1884  . IR IMAGING GUIDED PORT INSERTION  04/28/2019  . JOINT REPLACEMENT  2006,2007   bilateral  . LAPAROSCOPIC OVARIAN CYSTECTOMY     not done by laparscopy-abdominal incision  . LEG SURGERY    .  ORIF PERIPROSTHETIC FRACTURE Right 07/26/2019   Procedure: OPEN REDUCTION INTERNAL FIXATION (ORIF) RIGHT FEMUR PERIPROSTHETIC FRACTURE;  Surgeon: Rod Can, MD;  Location: [facility];  Service: Orthopedics;  Laterality: Right;  . PORTACATH PLACEMENT Right 04/01/2019   Procedure: ATTEMPTED INSERTION PORT-A-CATH WITH ULTRASOUND;  Surgeon: [name], MD;  Location: [facility];  Service: General;  Laterality: Right;  . RADIOACTIVE SEED GUIDED PARTIAL MASTECTOMY WITH AXILLARY SENTINEL LYMPH NODE BIOPSY Left 04/01/2019   Procedure: LEFT BREAST PARTIAL MASTECTOMY WITH RADIOACTIVE SEED AND LEFT SENTINEL LYMPH NODE BIOPSY;  Surgeon: [name], MD;  Location: [facility];  Service: General;  Laterality: Left;  . RE-EXCISION OF BREAST CANCER,SUPERIOR MARGINS Left 04/15/2019   Procedure: RE-EXCISION OF LEFT BREAST CANCER POSITIVE MARGINS;  Surgeon: [name], MD;  Location: [facility];  Service: General;  Laterality: Left;  . REPLACEMENT TOTAL KNEE BILATERAL    . TONSILLECTOMY      Social History   Socioeconomic History  . Marital status: Single    Spouse name: Not on file  . Number of children: 3  . Years of education: College  . Highest education level: Not on file  Occupational History  . Occupation: Retired Therapist, sports  Tobacco Use  . Smoking status: Current Some Day Smoker    Packs/day: 0.25    Years: 20.00    Pack years: 5.00    Types: Cigarettes  . Smokeless tobacco: Never Used  . Tobacco comment: 1-2 cigarettes a day  Vaping Use  . Vaping Use: Never used  Substance and  Sexual Activity  . Alcohol use: Yes    Comment: Glass of wine 1-2 times a year at Christmas  . Drug use: No  . Sexual activity: Never  Other Topics Concern  . Not on file  Social History Narrative   Patient is single and lives alone.   Patient is retired.   Patient has a college education.   Patient has three adult children   Patient is right-handed.   Patient drinks 3-4 cups of caffeine (soda and coffee) daily.   Walks with walker   Social Determinants of Health   Financial Resource Strain:   . Difficulty of Paying Living Expenses: Not on file  Food Insecurity:   . Worried About Charity fundraiser in the Last Year: Not on file  . Ran Out of Food in the Last Year: Not on file  Transportation Needs:   . Lack of Transportation (Medical): Not on file  . Lack of Transportation (Non-Medical): Not on file  Physical Activity:   . Days of Exercise per Week: Not on file  . Minutes of Exercise per Session: Not on file  Stress:   . Feeling of Stress : Not on file  Social Connections:   . Frequency of Communication with Friends and Family: Not on file  . Frequency of Social Gatherings with Friends and Family: Not on file  . Attends Religious Services: Not on file  . Active Member of Clubs or Organizations: Not on file  . Attends Archivist Meetings: Not on file  . Marital Status: Not on file  Intimate Partner Violence:   . Fear of Current or Ex-Partner: Not on file  . Emotionally Abused: Not on file  . Physically Abused: Not on file  . Sexually Abused: Not on file     Allergies  Allergen Reactions  . Clindamycin Hcl Other (See Comments)    REACTION: swelling, pt. Reports that she had a  16 lb. weightgain in one day     Immunization History  Administered Date(s) Administered  . Fluad Quad(high Dose 65+) 07/22/2020  . Influenza Split 08/14/2013  . Influenza Whole 08/20/2009  . Influenza, High Dose Seasonal PF 09/11/2017, 07/25/2018  . Influenza,inj,Quad PF,6+ Mos  08/04/2014, 09/06/2015, 07/10/2016  . PFIZER SARS-COV-2 Vaccination 12/27/2019, 01/17/2020  . Pneumococcal Conjugate-13 01/14/2015  . Pneumococcal Polysaccharide-23 07/11/2013  . Td 11/20/2005, 11/27/2016    Outpatient Medications Prior to Visit  Medication Sig Dispense Refill  . albuterol (VENTOLIN HFA) 108 (90 Base) MCG/ACT inhaler Inhale 2 puffs into the lungs every 6 (six) hours as needed for wheezing or shortness of breath. 18 g 11  . Brexpiprazole 1 MG TABS Take 1 mg by mouth at bedtime.     . busPIRone (BUSPAR) 5 MG tablet Take 5-10 mg by mouth 2 (two) times daily. 10 MG IN THE MORNING, 5 MG AT DINNER    . CALCIUM CITRATE PO Take 600 mg by mouth daily.     . clonazePAM (KLONOPIN) 0.5 MG tablet Take 1 tablet by mouth as needed in middle of the nite to go back to sleep 20 tablet 0  . CRANBERRY PO Take 2 tablets by mouth daily.    . D-Mannose 500 MG CAPS Take by mouth daily.    . diclofenac Sodium (VOLTAREN) 1 % GEL Apply 4 g topically. 2 to 3 times daily    . DULoxetine (CYMBALTA) 60 MG capsule Take 1 capsule (60 mg total) by mouth daily. 180 capsule 3  . ELIQUIS 5 MG TABS tablet TAKE 1 TABLET BY MOUTH TWICE A DAY 60 tablet 5  . furosemide (LASIX) 20 MG tablet Take 1 tablet (20 mg total) by mouth daily. 90 tablet 0  . KLOR-CON M20 20 MEQ tablet TAKE 1 TABLET BY MOUTH EVERY DAY 90 tablet 0  . levothyroxine (SYNTHROID) 50 MCG tablet TAKE ONE TABLET BY MOUTH ONCE DAILY 30 MINUTES BEFORE BREAKFAST FOR THYROID 90 tablet 1  . mirtazapine (REMERON) 15 MG tablet Take 7.5 mg by mouth at bedtime.  2  . Multiple Vitamins-Minerals (MULTIVITAMIN PO) Take 1 tablet by mouth daily.     [name] oxybutynin (DITROPAN-XL) 10 MG 24 hr tablet Take 10 mg by mouth daily.    [name] oxyCODONE-acetaminophen (PERCOCET) 5-325 MG tablet Take 1 tablet by mouth every 4 (four) hours as needed for severe pain. 180 tablet 0  . senna-docusate (SENOKOT-S) 8.6-50 MG tablet Take 1 tablet by mouth 2 (two) times daily as needed for mild  constipation.    . traZODone (DESYREL) 100 MG tablet Take 100 mg by mouth at bedtime.    . vitamin B-12 (CYANOCOBALAMIN) 500 MCG tablet Take 500 mcg by mouth daily.     No facility-administered medications prior to visit.    Review of Systems  Constitutional: Negative for chills and fever.       Previous weight loss and gaining back weight  Respiratory: Positive for shortness of breath. Negative for cough.   Cardiovascular: Positive for leg swelling. Negative for chest pain and palpitations.  Gastrointestinal: Negative for heartburn, nausea and vomiting.  Genitourinary: Positive for urgency. Negative for hematuria.       Incontinence  Skin: Negative for rash.  Neurological: Negative for focal weakness.  Endo/Heme/Allergies: Negative for environmental allergies.     Objective:   Vitals:   07/28/20 1050  BP: 106/60  Pulse: 67  Temp: (!) 97.3 F (36.3 C)  TempSrc: Temporal  SpO2: 98%  Weight: 190 lb (  86.2 kg)  Height: 5\' 2"  (1.575 m)   98% on  RA BMI Readings from Last 3 Encounters:  07/28/20 34.75 kg/m  07/22/20 34.96 kg/m  05/26/20 35.11 kg/m   Wt Readings from Last 3 Encounters:  07/28/20 190 lb (86.2 kg)  07/22/20 185 lb (83.9 kg)  05/26/20 185 lb 12.8 oz (84.3 kg)    Physical Exam Vitals reviewed.  Constitutional:      General: She is not in acute distress.    Appearance: Normal appearance.     Comments: Frail-appearing elderly woman sitting in a wheelchair  HENT:     Head: Normocephalic and atraumatic.  Eyes:     General: No scleral icterus. Cardiovascular:     Rate and Rhythm: Normal rate and regular rhythm.     Heart sounds: No murmur heard.   Pulmonary:     Comments: Breathing comfortably in room air, clear to auscultation bilaterally.  No conversational dyspnea. Abdominal:     General: There is no distension.     Palpations: Abdomen is soft.     Tenderness: There is no abdominal tenderness.  Musculoskeletal:        General: No deformity.      Cervical back: Neck supple.     Comments: Mild symmetric distal LE edema  Lymphadenopathy:     Cervical: No cervical adenopathy.  Skin:    General: Skin is warm and dry.     Findings: No rash.  Neurological:     General: No focal deficit present.     Mental Status: She is alert.     Coordination: Coordination normal.  Psychiatric:        Mood and Affect: Mood normal.        Behavior: Behavior normal.      CBC    Component Value Date/Time   WBC 7.9 04/22/2020 1240   WBC 9.1 08/06/2019 0334   RBC 3.86 ([name]) 04/22/2020 1240   HGB 9.6 ([name]) 04/22/2020 1240   HGB 13.2 09/15/2015 0816   HCT 31.7 ([name]) 04/22/2020 1240   HCT 40.2 09/15/2015 0816   PLT 313 04/22/2020 1240   PLT 199 09/15/2015 0816   MCV 82.1 04/22/2020 1240   MCV 89 09/15/2015 0816   MCH 24.9 ([name]) 04/22/2020 1240   MCHC 30.3 04/22/2020 1240   RDW 16.0 (H) 04/22/2020 1240   RDW 14.3 09/15/2015 0816   LYMPHSABS 1.0 04/22/2020 1240   LYMPHSABS 2.1 09/15/2015 0816   MONOABS 0.4 04/22/2020 1240   EOSABS 0.1 04/22/2020 1240   EOSABS 0.2 09/15/2015 0816   BASOSABS 0.1 04/22/2020 1240   BASOSABS 0.0 09/15/2015 0816    CHEMISTRY Recent Labs  Lab 07/22/20 1139  NA 139  K 4.2  CL 102  CO2 26  GLUCOSE 94  BUN 22  CREATININE 0.81  CALCIUM 9.1   Estimated Creatinine Clearance: 53.6 mL/min (by C-G formula based on SCr of 0.81 mg/dL).   Chest Imaging- films reviewed: CXR, 2 view 04/21/2020-right-sided Port-A-Cath in place, minimal dependent left pleural thickening versus scarring.  No silhouetting.  No lung opacities or masses.  Pulmonary Functions Testing Results: PFT Results Latest Ref Rng & Units 07/28/2020  FVC-Pre [name] 2.60  FVC-Predicted Pre % 116  FVC-Post [name] 2.78  FVC-Predicted Post % 124  Pre FEV1/FVC % % 77  Post FEV1/FCV % % 79  FEV1-Pre [name] 1.99  FEV1-Predicted Pre % 120  FEV1-Post [name] 2.21   2021-no significant obstruction or bronchodilator reversibility.  Unable to perform lung volumes or diffusion  measurements.  Flow volume loops with flow curvature suggestive of obstruction.  Echocardiogram 03/27/2019: LVEF 60 to 65%, mildly increased LV wall thickness but normal diastolic function.  Normal LA, RV, RA.  Mild PR, aortic sclerosis without stenosis, mild TR.      Assessment & Plan:     ICD-10-CM   1. DOE (dyspnea on exertion)  R06.00   2. Physical deconditioning  R53.81     DOE- likely multifactorial from deconditioning, chronic anemia, possibly smoking related lung disease.  No obstruction or significant bronchodilator reversibility on PFTs.  No evidence of PH on previous echocardiograms.  -Spirometry reviewed today -Continue albuterol Q 6 hours as needed for shortness of breath. -Strongly recommend smoking cessation  Chronic anticoagulation for recurrent VTEs- mostly unprovoked -Continue Eliquis twice daily indefinitely unless risk of bleeding complications supersedes her risk of recurrent VTE.  Tobacco abuse -Strongly recommend cessation; discussed the risks of ongoing tobacco use. -Outside recommended age range for lung cancer screening.  RTC in 6 months with Dr. Erin Fulling.   Current Outpatient Medications:  .  albuterol (VENTOLIN HFA) 108 (90 Base) MCG/ACT inhaler, Inhale 2 puffs into the lungs every 6 (six) hours as needed for wheezing or shortness of breath., Disp: 18 g, Rfl: 11 .  Brexpiprazole 1 MG TABS, Take 1 mg by mouth at bedtime. , Disp: , Rfl:  .  busPIRone (BUSPAR) 5 MG tablet, Take 5-10 mg by mouth 2 (two) times daily. 10 MG IN THE MORNING, 5 MG AT DINNER, Disp: , Rfl:  .  CALCIUM CITRATE PO, Take 600 mg by mouth daily. , Disp: , Rfl:  .  clonazePAM (KLONOPIN) 0.5 MG tablet, Take 1 tablet by mouth as needed in middle of the nite to go back to sleep, Disp: 20 tablet, Rfl: 0 .  CRANBERRY PO, Take 2 tablets by mouth daily., Disp: , Rfl:  .  D-Mannose 500 MG CAPS, Take by mouth daily., Disp: , Rfl:  .  diclofenac Sodium (VOLTAREN) 1 % GEL, Apply 4 g topically. 2 to 3  times daily, Disp: , Rfl:  .  DULoxetine (CYMBALTA) 60 MG capsule, Take 1 capsule (60 mg total) by mouth daily., Disp: 180 capsule, Rfl: 3 .  ELIQUIS 5 MG TABS tablet, TAKE 1 TABLET BY MOUTH TWICE A DAY, Disp: 60 tablet, Rfl: 5 .  furosemide (LASIX) 20 MG tablet, Take 1 tablet (20 mg total) by mouth daily., Disp: 90 tablet, Rfl: 0 .  KLOR-CON M20 20 MEQ tablet, TAKE 1 TABLET BY MOUTH EVERY DAY, Disp: 90 tablet, Rfl: 0 .  levothyroxine (SYNTHROID) 50 MCG tablet, TAKE ONE TABLET BY MOUTH ONCE DAILY 30 MINUTES BEFORE BREAKFAST FOR THYROID, Disp: 90 tablet, Rfl: 1 .  mirtazapine (REMERON) 15 MG tablet, Take 7.5 mg by mouth at bedtime., Disp: , Rfl: 2 .  Multiple Vitamins-Minerals (MULTIVITAMIN PO), Take 1 tablet by mouth daily. , Disp: , Rfl:  .  oxybutynin (DITROPAN-XL) 10 MG 24 hr tablet, Take 10 mg by mouth daily., Disp: , Rfl:  .  oxyCODONE-acetaminophen (PERCOCET) 5-325 MG tablet, Take 1 tablet by mouth every 4 (four) hours as needed for severe pain., Disp: 180 tablet, Rfl: 0 .  senna-docusate (SENOKOT-S) 8.6-50 MG tablet, Take 1 tablet by mouth 2 (two) times daily as needed for mild constipation., Disp: , Rfl:  .  traZODone (DESYREL) 100 MG tablet, Take 100 mg by mouth at bedtime., Disp: , Rfl:  .  vitamin B-12 (CYANOCOBALAMIN) 500 MCG tablet, Take 500 mcg by mouth daily., Disp: ,  Rfl:      Julian Hy, DO [facility] 07/28/2020 11:05 AM

## 2020-07-29 DIAGNOSIS — F331 Major depressive disorder, recurrent, moderate: Secondary | ICD-10-CM | POA: Diagnosis not present

## 2020-07-29 DIAGNOSIS — M6281 Muscle weakness (generalized): Secondary | ICD-10-CM | POA: Diagnosis not present

## 2020-07-29 DIAGNOSIS — F341 Dysthymic disorder: Secondary | ICD-10-CM | POA: Diagnosis not present

## 2020-07-29 DIAGNOSIS — F411 Generalized anxiety disorder: Secondary | ICD-10-CM | POA: Diagnosis not present

## 2020-07-29 DIAGNOSIS — R262 Difficulty in walking, not elsewhere classified: Secondary | ICD-10-CM | POA: Diagnosis not present

## 2020-07-29 DIAGNOSIS — M25561 Pain in right knee: Secondary | ICD-10-CM | POA: Diagnosis not present

## 2020-08-04 DIAGNOSIS — M25561 Pain in right knee: Secondary | ICD-10-CM | POA: Diagnosis not present

## 2020-08-04 DIAGNOSIS — M6281 Muscle weakness (generalized): Secondary | ICD-10-CM | POA: Diagnosis not present

## 2020-08-04 DIAGNOSIS — R262 Difficulty in walking, not elsewhere classified: Secondary | ICD-10-CM | POA: Diagnosis not present

## 2020-08-12 DIAGNOSIS — F411 Generalized anxiety disorder: Secondary | ICD-10-CM | POA: Diagnosis not present

## 2020-08-12 DIAGNOSIS — F341 Dysthymic disorder: Secondary | ICD-10-CM | POA: Diagnosis not present

## 2020-08-12 DIAGNOSIS — F332 Major depressive disorder, recurrent severe without psychotic features: Secondary | ICD-10-CM | POA: Diagnosis not present

## 2020-08-30 DIAGNOSIS — Z23 Encounter for immunization: Secondary | ICD-10-CM | POA: Diagnosis not present

## 2020-09-27 ENCOUNTER — Other Ambulatory Visit: Payer: Self-pay | Admitting: Internal Medicine

## 2020-09-27 DIAGNOSIS — Z7901 Long term (current) use of anticoagulants: Secondary | ICD-10-CM

## 2020-09-30 DIAGNOSIS — I739 Peripheral vascular disease, unspecified: Secondary | ICD-10-CM | POA: Diagnosis not present

## 2020-09-30 DIAGNOSIS — L603 Nail dystrophy: Secondary | ICD-10-CM | POA: Diagnosis not present

## 2020-11-23 ENCOUNTER — Other Ambulatory Visit: Payer: Self-pay | Admitting: Internal Medicine

## 2020-11-23 DIAGNOSIS — E039 Hypothyroidism, unspecified: Secondary | ICD-10-CM

## 2020-11-23 NOTE — Telephone Encounter (Signed)
Last TSH was greater than 12 months ago, notation made on pending appointment 11/25/20 to get labs if provider feels it is necessary

## 2020-11-25 ENCOUNTER — Ambulatory Visit (INDEPENDENT_AMBULATORY_CARE_PROVIDER_SITE_OTHER): Payer: Medicare Other | Admitting: Internal Medicine

## 2020-11-25 ENCOUNTER — Encounter: Payer: Self-pay | Admitting: Internal Medicine

## 2020-11-25 ENCOUNTER — Other Ambulatory Visit: Payer: Self-pay

## 2020-11-25 VITALS — BP 110/62 | HR 61 | Temp 97.3°F | Ht 62.0 in | Wt 190.0 lb

## 2020-11-25 DIAGNOSIS — Z170 Estrogen receptor positive status [ER+]: Secondary | ICD-10-CM | POA: Diagnosis not present

## 2020-11-25 DIAGNOSIS — E119 Type 2 diabetes mellitus without complications: Secondary | ICD-10-CM

## 2020-11-25 DIAGNOSIS — E1169 Type 2 diabetes mellitus with other specified complication: Secondary | ICD-10-CM

## 2020-11-25 DIAGNOSIS — Z853 Personal history of malignant neoplasm of breast: Secondary | ICD-10-CM | POA: Diagnosis not present

## 2020-11-25 DIAGNOSIS — C50412 Malignant neoplasm of upper-outer quadrant of left female breast: Secondary | ICD-10-CM

## 2020-11-25 DIAGNOSIS — D689 Coagulation defect, unspecified: Secondary | ICD-10-CM

## 2020-11-25 DIAGNOSIS — N644 Mastodynia: Secondary | ICD-10-CM

## 2020-11-25 DIAGNOSIS — I480 Paroxysmal atrial fibrillation: Secondary | ICD-10-CM

## 2020-11-25 DIAGNOSIS — R569 Unspecified convulsions: Secondary | ICD-10-CM | POA: Insufficient documentation

## 2020-11-25 DIAGNOSIS — I825Y3 Chronic embolism and thrombosis of unspecified deep veins of proximal lower extremity, bilateral: Secondary | ICD-10-CM

## 2020-11-25 DIAGNOSIS — E039 Hypothyroidism, unspecified: Secondary | ICD-10-CM

## 2020-11-25 DIAGNOSIS — F309 Manic episode, unspecified: Secondary | ICD-10-CM

## 2020-11-25 NOTE — Progress Notes (Signed)
Location:  [facility] clinic Provider:  Alexah Kivett L. Mariea Clonts, D.O., C.M.D.  Code Status: DNR Goals of Care:  Advanced Directives 11/25/2020  Does Patient Have a Medical Advance Directive? Yes  Type of Advance Directive Out of facility DNR (pink MOST or yellow form)  Does patient want to make changes to medical advance directive? No - Patient declined  Copy of [place] in Chart? -  Would patient like information on creating a medical advance directive? -  Pre-existing out of facility DNR order (yellow form or pink MOST form) Pink MOST/Yellow Form most recent copy in chart - Physician notified to receive inpatient order     Chief Complaint  Patient presents with  . Medical Management of Chronic Issues    4 month follow up   . Health Maintenance    Urine Microalbumin    HPI: Patient is a 86 y.o. female seen today for medical management of chronic diseases.    Still has pain in left breast area that is likely scar tissue.  Avoiding further testing.  H/o breast ca.  Had challenges with depression b/w visits and in winter, but doing better.   Double vision--went to a specialist for this in [place].  She no longer sees double at a distance.  Still having trouble reading--can only do a few words at a time.  They go back next month.  She wants to walk and can do so with the walker.  Some deconditioning from being inside over the winter.    Not having other pain.    Appetite is still good.    She has to urinate too often when takes her 1/2 lasix every other day.  She refuses anymore than it.  Has to be changed 4-5 times.  No UTIs in a year.  Wears diaper and pad and just goes as soon as gets urge.    She is still smoking 1-2 cigarettes a day.    Past Medical History:  Diagnosis Date  . A-fib ([place])   . Anemia   . Anxiety   . Arthritis    hands, spine   . Bladder incontinence   . Blood transfusion   . Cancer ([place]) 02/2019   left breast cancer  . Cervicalgia   .  Constipation   . Cough    Wert-onset 08/2009, as of 2014- resolved   . Deep venous thrombosis ([place])    post op, rec'd/needed  blood thinner   . Depression   . Depression with anxiety   . Dysrhythmia    a-fib  . Falls frequently    pt. reports that she was falling 3 times a day, has had PT at a facility for a while & now is getting home PT 2-3 times/ week   . Headache(784.0) 09/27/2011  . History of stress test    15-20 yrs.ago   . Hypotension   . Hypothyroidism   . Lumbar radiculopathy 09/26/2011  . Pneumonia    never been in hosp. for pneumonia   . Prediabetes   . Right hand fracture    Monday  . Shortness of breath   . Sleep apnea    does not use consistently    Past Surgical History:  Procedure Laterality Date  . ANTERIOR CERVICAL DECOMP/DISCECTOMY FUSION N/A 07/09/2013   Procedure: ANTERIOR CERVICAL DECOMPRESSION/DISCECTOMY FUSION 3 LEVELS;  Surgeon: Sinclair Ship, MD;  Location: [facility];  Service: Orthopedics;  Laterality: N/A;  Anterior cervical decompression fusion, cervical 3-4, cervical 4-5-, cervical  5-6 with instrumentation, allograft.  . APPENDECTOMY    . BACK SURGERY     2000  . BREAST EXCISIONAL BIOPSY     breast bxs on left x 2, breast bx of right x 1-all benign  . EYE SURGERY     cataracts bilateral /w IOL  . HERNIA REPAIR     umbilical hernia- [id number]  . IR IMAGING GUIDED PORT INSERTION  04/28/2019  . JOINT REPLACEMENT  2006,2007   bilateral  . LAPAROSCOPIC OVARIAN CYSTECTOMY     not done by laparscopy-abdominal incision  . LEG SURGERY    . ORIF PERIPROSTHETIC FRACTURE Right 07/26/2019   Procedure: OPEN REDUCTION INTERNAL FIXATION (ORIF) RIGHT FEMUR PERIPROSTHETIC FRACTURE;  Surgeon: Rod Can, MD;  Location: [facility];  Service: Orthopedics;  Laterality: Right;  . PORTACATH PLACEMENT Right 04/01/2019   Procedure: ATTEMPTED INSERTION PORT-A-CATH WITH ULTRASOUND;  Surgeon: [name], MD;  Location: [facility];  Service: General;   Laterality: Right;  . RADIOACTIVE SEED GUIDED PARTIAL MASTECTOMY WITH AXILLARY SENTINEL LYMPH NODE BIOPSY Left 04/01/2019   Procedure: LEFT BREAST PARTIAL MASTECTOMY WITH RADIOACTIVE SEED AND LEFT SENTINEL LYMPH NODE BIOPSY;  Surgeon: [name], MD;  Location: [facility];  Service: General;  Laterality: Left;  . RE-EXCISION OF BREAST CANCER,SUPERIOR MARGINS Left 04/15/2019   Procedure: RE-EXCISION OF LEFT BREAST CANCER POSITIVE MARGINS;  Surgeon: [name], MD;  Location: [facility];  Service: General;  Laterality: Left;  . REPLACEMENT TOTAL KNEE BILATERAL    . TONSILLECTOMY      Allergies  Allergen Reactions  . Clindamycin Hcl Other (See Comments)    REACTION: swelling, pt. Reports that she had a 16 lb. weightgain in one day    Outpatient Encounter Medications as of 11/25/2020  Medication Sig  . albuterol (VENTOLIN HFA) 108 (90 Base) MCG/ACT inhaler Inhale 2 puffs into the lungs every 6 (six) hours as needed for wheezing or shortness of breath.  . Brexpiprazole 1 MG TABS Take 1 mg by mouth at bedtime.   . busPIRone (BUSPAR) 5 MG tablet Take 5-10 mg by mouth 2 (two) times daily. 10 MG IN THE MORNING, 5 MG AT DINNER  . CALCIUM CITRATE PO Take 600 mg by mouth daily.   . clonazePAM (KLONOPIN) 0.5 MG tablet Take 1 tablet by mouth as needed in middle of the nite to go back to sleep  . CRANBERRY PO Take 2 tablets by mouth daily.  . D-Mannose 500 MG CAPS Take by mouth daily.  . DULoxetine (CYMBALTA) 60 MG capsule Take 1 capsule (60 mg total) by mouth daily.  [name] ELIQUIS 5 MG TABS tablet TAKE 1 TABLET BY MOUTH TWICE A DAY  . furosemide (LASIX) 20 MG tablet Take 1 tablet (20 mg total) by mouth daily.  [name] KLOR-CON M20 20 MEQ tablet TAKE 1 TABLET BY MOUTH EVERY DAY  . levothyroxine (SYNTHROID) 50 MCG tablet Take 1 tablet (50 mcg total) by mouth daily before breakfast. Labs overdue  . oxybutynin (DITROPAN-XL) 10 MG 24 hr tablet Take 10 mg by mouth daily.  [name]  oxyCODONE-acetaminophen (PERCOCET) 5-325 MG tablet Take 1 tablet by mouth every 4 (four) hours as needed for severe pain.  [name] senna-docusate (SENOKOT-S) 8.6-50 MG tablet Take 1 tablet by mouth 2 (two) times daily as needed for mild constipation.  . traZODone (DESYREL) 100 MG tablet Take 100 mg by mouth at bedtime.  . vitamin B-12 (CYANOCOBALAMIN) 500 MCG tablet Take 500 mcg by mouth daily.  . [DISCONTINUED]  diclofenac Sodium (VOLTAREN) 1 % GEL Apply 4 g topically. 2 to 3 times daily  . mirtazapine (REMERON) 15 MG tablet Take 7.5 mg by mouth at bedtime.  . Multiple Vitamins-Minerals (MULTIVITAMIN PO) Take 1 tablet by mouth daily.    No facility-administered encounter medications on file as of 11/25/2020.    Review of Systems:  Review of Systems  Constitutional: Positive for malaise/fatigue. Negative for chills and fever.  HENT: Positive for hearing loss. Negative for congestion and sore throat.   Eyes: Positive for double vision. Negative for blurred vision.  Respiratory: Negative for cough and shortness of breath.   Cardiovascular: Negative for chest pain, palpitations and leg swelling.  Gastrointestinal: Negative for abdominal pain and constipation.  Genitourinary: Positive for frequency and urgency. Negative for dysuria.  Musculoskeletal: Negative for falls and joint pain.  Skin: Negative for itching and rash.  Neurological: Positive for weakness. Negative for dizziness and loss of consciousness.  Endo/Heme/Allergies: Bruises/bleeds easily.  Psychiatric/Behavioral: Positive for depression and memory loss. The patient is not nervous/anxious and does not have insomnia.     Health Maintenance  Topic Date Due  . URINE MICROALBUMIN  10/22/2019  . HEMOGLOBIN A1C  01/19/2021  . OPHTHALMOLOGY EXAM  02/16/2021  . COVID-19 Vaccine (4 - Booster for Pfizer series) 02/28/2021  . FOOT EXAM  07/22/2021  . TETANUS/TDAP  11/27/2026  . INFLUENZA VACCINE  Completed  . DEXA SCAN  Completed  . PNA vac  Low Risk Adult  Completed    Physical Exam: Vitals:   11/25/20 1144  BP: 110/62  Pulse: 61  Temp: (!) 97.3 F (36.3 C)  TempSrc: Temporal  SpO2: 96%  Weight: 190 lb (86.2 kg)  Height: 5\' 2"  (1.575 m)   Body mass index is 34.75 kg/m. Physical Exam Vitals reviewed.  Constitutional:      General: She is not in acute distress.    Appearance: Normal appearance. She is obese. She is not toxic-appearing.  HENT:     Head: Normocephalic and atraumatic.  Eyes:     Extraocular Movements: Extraocular movements intact.     Pupils: Pupils are equal, round, and reactive to light.  Cardiovascular:     Rate and Rhythm: Rhythm irregular.     Heart sounds: No murmur heard.   Pulmonary:     Effort: Pulmonary effort is normal.     Breath sounds: Normal breath sounds. No wheezing, rhonchi or rales.  Abdominal:     General: Bowel sounds are normal.  Musculoskeletal:        General: Normal range of motion.     Right lower leg: No edema.     Left lower leg: No edema.  Neurological:     General: No focal deficit present.     Mental Status: She is alert and oriented to person, place, and time.     Motor: Weakness present.     Gait: Gait abnormal.  Psychiatric:        Mood and Affect: Mood normal.    Labs reviewed: Basic Metabolic Panel: Recent Labs    04/22/20 1240 05/07/20 0956 07/22/20 1139  NA 139 139 139  K 4.4 4.2 4.2  CL 103 101 102  CO2 27 26 26   GLUCOSE 99 117* 94  BUN 18 30* 22  CREATININE 0.84 0.76 0.81  CALCIUM 9.6 9.6 9.1   Liver Function Tests: Recent Labs    12/18/19 1500 04/22/20 1240  AST 11* 14*  ALT 5* 7  ALKPHOS  --  101  BILITOT  --  0.3  PROT  --  6.9  ALBUMIN 2.7* 3.2*   No results for input(s): LIPASE, AMYLASE in the last 8760 hours. No results for input(s): AMMONIA in the last 8760 hours. CBC: Recent Labs    12/18/19 0000 04/22/20 1240  WBC 6.5 7.9  NEUTROABS  --  6.3  HGB 8.4* 9.6*  HCT 26* 31.7*  MCV  --  82.1  PLT 407* 313    Lipid Panel: No results for input(s): CHOL, HDL, LDLCALC, TRIG, CHOLHDL, LDLDIRECT in the last 8760 hours. Lab Results  Component Value Date   HGBA1C 5.2 07/22/2020   Assessment/Plan 1. Bipolar I disorder, single manic episode ([place]) -recently had period of increased depression in winter and after concern that breast cancer may have recurred -has improved again in spirits  2. Type 2 diabetes mellitus with other specified complication, without long-term current use of insulin ([facility]) -control has been good - Microalbumin / creatinine urine ratio  3. Chronic deep vein thrombosis (DVT) of proximal vein of both lower extremities ([facility]) -some edema of right greater than left ankles chronically -remains on eliquis  4. Paroxysmal A-fib ([facility]) -cont eliquis, rate controlled w/o meds - CBC with Differential/Platelet  5. Obesity, morbid ([place]) -ongoing, but mobility has declined recently and she's deconditioned--as she's willing, her caregiver will get her up to walk with walker and gait belt when out  6. Seizures ([place]) -h/o, not on active treatment  7. Coagulation disorder ([place]) -continue eliquis therapy  8. Malignant neoplasm of upper-outer quadrant of left breast in female, estrogen receptor positive ([place]) -left breast has had some pain and felt likely to be scarring and decision made to treat palliatively avoiding further mammograms and imaging  9. Hypothyroidism, unspecified type - cont levothyroxine therapy - TSH  10.  Type 2 diabetes mellitus without complication, without long-term current use of insulin (HCC) -check labs - Hemoglobin A1c - COMPLETE METABOLIC PANEL WITH GFR  12. Breast pain, left -likely scar tissue, no further mammograms, treating paliatively with other comorbidities and decreased function - CBC with Differential/Platelet  Labs/tests ordered:   Orders Placed This Encounter  Procedures  . TSH  . Microalbumin / creatinine urine ratio  also other labs  released that were ordered for before visit  Next appt:  4 mos med mgt, fasting for labs same day   Tawan Degroote L. Caden Fukushima, D.O. [facility] [address], [place] [address] Cell Phone (Mon-Fri 8am-5pm):  [phone] On Call:  [phone] & follow prompts after 5pm & weekends Office Phone:  [phone] Office Fax:  [phone]

## 2020-11-26 LAB — CBC WITH DIFFERENTIAL/PLATELET
Absolute Monocytes: 372 cells/uL (ref 200–950)
Basophils Absolute: 41 cells/uL (ref 0–200)
Basophils Relative: 0.7 %
Eosinophils Absolute: 171 cells/uL (ref 15–500)
Eosinophils Relative: 2.9 %
HCT: 35 % (ref 35.0–45.0)
Hemoglobin: 11.2 g/dL — ABNORMAL LOW (ref 11.7–15.5)
Lymphs Abs: 1097 cells/uL (ref 850–3900)
MCH: 25.9 pg — ABNORMAL LOW (ref 27.0–33.0)
MCHC: 32 g/dL (ref 32.0–36.0)
MCV: 80.8 fL (ref 80.0–100.0)
MPV: 10 fL (ref 7.5–12.5)
Monocytes Relative: 6.3 %
Neutro Abs: 4219 cells/uL (ref 1500–7800)
Neutrophils Relative %: 71.5 %
Platelets: 289 10*3/uL (ref 140–400)
RBC: 4.33 10*6/uL (ref 3.80–5.10)
RDW: 14.4 % (ref 11.0–15.0)
Total Lymphocyte: 18.6 %
WBC: 5.9 10*3/uL (ref 3.8–10.8)

## 2020-11-26 LAB — HEMOGLOBIN A1C
Hgb A1c MFr Bld: 5.3 % of total Hgb (ref <5.7)
Mean Plasma Glucose: 105 mg/dL
eAG (mmol/L): 5.8 mmol/L

## 2020-11-26 LAB — COMPLETE METABOLIC PANEL WITH GFR
AG Ratio: 1.3 (calc) (ref 1.0–2.5)
ALT: 7 U/L (ref 6–29)
AST: 13 U/L (ref 10–35)
Albumin: 3.6 g/dL (ref 3.6–5.1)
Alkaline phosphatase (APISO): 67 U/L (ref 37–153)
BUN/Creatinine Ratio: 31 (calc) — ABNORMAL HIGH (ref 6–22)
BUN: 28 mg/dL — ABNORMAL HIGH (ref 7–25)
CO2: 28 mmol/L (ref 20–32)
Calcium: 9.6 mg/dL (ref 8.6–10.4)
Chloride: 102 mmol/L (ref 98–110)
Creat: 0.9 mg/dL — ABNORMAL HIGH (ref 0.60–0.88)
GFR, Est African American: 69 mL/min/{1.73_m2} (ref >=60)
GFR, Est Non African American: 59 mL/min/{1.73_m2} — ABNORMAL LOW (ref >=60)
Globulin: 2.7 g/dL (calc) (ref 1.9–3.7)
Glucose, Bld: 101 mg/dL (ref 65–139)
Potassium: 4.2 mmol/L (ref 3.5–5.3)
Sodium: 139 mmol/L (ref 135–146)
Total Bilirubin: 0.3 mg/dL (ref 0.2–1.2)
Total Protein: 6.3 g/dL (ref 6.1–8.1)

## 2020-11-26 LAB — TSH: TSH: 1.05 mIU/L (ref 0.40–4.50)

## 2020-11-27 LAB — MICROALBUMIN / CREATININE URINE RATIO
Creatinine, Urine: 56 mg/dL (ref 20–275)
Microalb Creat Ratio: 7 mcg/mg creat (ref <30)
Microalb, Ur: 0.4 mg/dL

## 2020-11-29 NOTE — Progress Notes (Signed)
No significant protein in urine Thyroid in normal range Sugar average in normal range Kidney tests suggest she is not drinking enough water Liver is normal Anemia has improved considerably since June.  :)

## 2020-12-22 ENCOUNTER — Other Ambulatory Visit: Payer: Self-pay | Admitting: Internal Medicine

## 2020-12-22 DIAGNOSIS — E039 Hypothyroidism, unspecified: Secondary | ICD-10-CM

## 2020-12-23 ENCOUNTER — Other Ambulatory Visit: Payer: Self-pay

## 2020-12-23 ENCOUNTER — Ambulatory Visit (INDEPENDENT_AMBULATORY_CARE_PROVIDER_SITE_OTHER): Payer: Medicare Other | Admitting: Nurse Practitioner

## 2020-12-23 ENCOUNTER — Encounter: Payer: Self-pay | Admitting: Nurse Practitioner

## 2020-12-23 ENCOUNTER — Telehealth: Payer: Self-pay

## 2020-12-23 DIAGNOSIS — E2839 Other primary ovarian failure: Secondary | ICD-10-CM | POA: Diagnosis not present

## 2020-12-23 DIAGNOSIS — Z0000 Encounter for general adult medical examination without abnormal findings: Secondary | ICD-10-CM | POA: Diagnosis not present

## 2020-12-23 NOTE — Telephone Encounter (Signed)
[name], stelmach are scheduled for a virtual visit with your provider today.    Just as we do with appointments in the office, we must obtain your consent to participate.  Your consent will be active for this visit and any virtual visit you may have with one of our providers in the next 365 days.    If you have a MyChart account, I can also send a copy of this consent to you electronically.  All virtual visits are billed to your insurance company just like a traditional visit in the office.  As this is a virtual visit, video technology does not allow for your provider to perform a traditional examination.  This may limit your provider's ability to fully assess your condition.  If your provider identifies any concerns that need to be evaluated in person or the need to arrange testing such as labs, EKG, etc, we will make arrangements to do so.    Although advances in technology are sophisticated, we cannot ensure that it will always work on either your end or our end.  If the connection with a video visit is poor, we may have to switch to a telephone visit.  With either a video or telephone visit, we are not always able to ensure that we have a secure connection.   I need to obtain your verbal consent now.   Are you willing to proceed with your visit today?   Andrea [name] has provided verbal consent on 12/23/2020 for a virtual visit (video or telephone).   Carroll Kinds, CMA 12/23/2020  10:59 AM

## 2020-12-23 NOTE — Progress Notes (Signed)
This service is provided via telemedicine  No vital signs collected/recorded due to the encounter was a telemedicine visit.   Location of patient (ex: home, work):  Home  Patient consents to a telephone visit: Yes, see encounter dated 12/23/2020  Location of the provider (ex: office, home):  [place]  Name of any referring provider:  Hollace Kinnier, MD  Names of all persons participating in the telemedicine service and their role in the encounter:  [name], Nurse Practitioner, Carroll Kinds, CMA, and patient.   Time spent on call:  12 minutes with medical assistant

## 2020-12-23 NOTE — Patient Instructions (Signed)
Andrea [name] , Thank you for taking time to come for your Medicare Wellness Visit. I appreciate your ongoing commitment to your health goals. Please review the following plan we discussed and let me know if I can assist you in the future.   Screening recommendations/referrals: Colonoscopy aged out Mammogram aged out Bone Density RECOMMENDED at this time, call [phone] Recommended yearly ophthalmology/optometry visit for glaucoma screening and checkup Recommended yearly dental visit for hygiene and checkup  Vaccinations: Influenza vaccine up to date Pneumococcal vaccine up to date Tdap vaccine up to date Shingles vaccine RECOMMENDED- to get at your local pharmacy     Advanced directives: on file.   Conditions/risks identified: advanced age, smoking, obesity   Next appointment: year    Preventive Care 40 Years and Older, Female Preventive care refers to lifestyle choices and visits with your health care provider that can promote health and wellness. What does preventive care include?  A yearly physical exam. This is also called an annual well check.  Dental exams once or twice a year.  Routine eye exams. Ask your health care provider how often you should have your eyes checked.  Personal lifestyle choices, including:  Daily care of your teeth and gums.  Regular physical activity.  Eating a healthy diet.  Avoiding tobacco and drug use.  Limiting alcohol use.  Practicing safe sex.  Taking low-dose aspirin every day.  Taking vitamin and mineral supplements as recommended by your health care provider. What happens during an annual well check? The services and screenings done by your health care provider during your annual well check will depend on your age, overall health, lifestyle risk factors, and family history of disease. Counseling  Your health care provider may ask you questions about your:  Alcohol use.  Tobacco use.  Drug use.  Emotional  well-being.  Home and relationship well-being.  Sexual activity.  Eating habits.  History of falls.  Memory and ability to understand (cognition).  Work and work Statistician.  Reproductive health. Screening  You may have the following tests or measurements:  Height, weight, and BMI.  Blood pressure.  Lipid and cholesterol levels. These may be checked every 5 years, or more frequently if you are over 47 years old.  Skin check.  Lung cancer screening. You may have this screening every year starting at age 85 if you have a 30-pack-year history of smoking and currently smoke or have quit within the past 15 years.  Fecal occult blood test (FOBT) of the stool. You may have this test every year starting at age 43.  Flexible sigmoidoscopy or colonoscopy. You may have a sigmoidoscopy every 5 years or a colonoscopy every 10 years starting at age 31.  Hepatitis C blood test.  Hepatitis B blood test.  Sexually transmitted disease (STD) testing.  Diabetes screening. This is done by checking your blood sugar (glucose) after you have not eaten for a while (fasting). You may have this done every 1-3 years.  Bone density scan. This is done to screen for osteoporosis. You may have this done starting at age 71.  Mammogram. This may be done every 1-2 years. Talk to your health care provider about how often you should have regular mammograms. Talk with your health care provider about your test results, treatment options, and if necessary, the need for more tests. Vaccines  Your health care provider may recommend certain vaccines, such as:  Influenza vaccine. This is recommended every year.  Tetanus, diphtheria, and acellular pertussis (Tdap, Td)  vaccine. You may need a Td booster every 10 years.  Zoster vaccine. You may need this after age 42.  Pneumococcal 13-valent conjugate (PCV13) vaccine. One dose is recommended after age 4.  Pneumococcal polysaccharide (PPSV23) vaccine. One  dose is recommended after age 2. Talk to your health care provider about which screenings and vaccines you need and how often you need them. This information is not intended to replace advice given to you by your health care provider. Make sure you discuss any questions you have with your health care provider. Document Released: 12/03/2015 Document Revised: 07/26/2016 Document Reviewed: 09/07/2015 Elsevier Interactive Patient Education  2017 [place] Prevention in the Home Falls can cause injuries. They can happen to people of all ages. There are many things you can do to make your home safe and to help prevent falls. What can I do on the outside of my home?  Regularly fix the edges of walkways and driveways and fix any cracks.  Remove anything that might make you trip as you walk through a door, such as a raised step or threshold.  Trim any bushes or trees on the path to your home.  Use bright outdoor lighting.  Clear any walking paths of anything that might make someone trip, such as rocks or tools.  Regularly check to see if handrails are loose or broken. Make sure that both sides of any steps have handrails.  Any raised decks and porches should have guardrails on the edges.  Have any leaves, snow, or ice cleared regularly.  Use sand or salt on walking paths during winter.  Clean up any spills in your garage right away. This includes oil or grease spills. What can I do in the bathroom?  Use night lights.  Install grab bars by the toilet and in the tub and shower. Do not use towel bars as grab bars.  Use non-skid mats or decals in the tub or shower.  If you need to sit down in the shower, use a plastic, non-slip stool.  Keep the floor dry. Clean up any water that spills on the floor as soon as it happens.  Remove soap buildup in the tub or shower regularly.  Attach bath mats securely with double-sided non-slip rug tape.  Do not have throw rugs and other  things on the floor that can make you trip. What can I do in the bedroom?  Use night lights.  Make sure that you have a light by your bed that is easy to reach.  Do not use any sheets or blankets that are too big for your bed. They should not hang down onto the floor.  Have a firm chair that has side arms. You can use this for support while you get dressed.  Do not have throw rugs and other things on the floor that can make you trip. What can I do in the kitchen?  Clean up any spills right away.  Avoid walking on wet floors.  Keep items that you use a lot in easy-to-reach places.  If you need to reach something above you, use a strong step stool that has a grab bar.  Keep electrical cords out of the way.  Do not use floor polish or wax that makes floors slippery. If you must use wax, use non-skid floor wax.  Do not have throw rugs and other things on the floor that can make you trip. What can I do with my stairs?  Do not leave  any items on the stairs.  Make sure that there are handrails on both sides of the stairs and use them. Fix handrails that are broken or loose. Make sure that handrails are as long as the stairways.  Check any carpeting to make sure that it is firmly attached to the stairs. Fix any carpet that is loose or worn.  Avoid having throw rugs at the top or bottom of the stairs. If you do have throw rugs, attach them to the floor with carpet tape.  Make sure that you have a light switch at the top of the stairs and the bottom of the stairs. If you do not have them, ask someone to add them for you. What else can I do to help prevent falls?  Wear shoes that:  Do not have high heels.  Have rubber bottoms.  Are comfortable and fit you well.  Are closed at the toe. Do not wear sandals.  If you use a stepladder:  Make sure that it is fully opened. Do not climb a closed stepladder.  Make sure that both sides of the stepladder are locked into place.  Ask  someone to hold it for you, if possible.  Clearly mark and make sure that you can see:  Any grab bars or handrails.  First and last steps.  Where the edge of each step is.  Use tools that help you move around (mobility aids) if they are needed. These include:  Canes.  Walkers.  Scooters.  Crutches.  Turn on the lights when you go into a dark area. Replace any light bulbs as soon as they burn out.  Set up your furniture so you have a clear path. Avoid moving your furniture around.  If any of your floors are uneven, fix them.  If there are any pets around you, be aware of where they are.  Review your medicines with your doctor. Some medicines can make you feel dizzy. This can increase your chance of falling. Ask your doctor what other things that you can do to help prevent falls. This information is not intended to replace advice given to you by your health care provider. Make sure you discuss any questions you have with your health care provider. Document Released: 09/02/2009 Document Revised: 04/13/2016 Document Reviewed: 12/11/2014 Elsevier Interactive Patient Education  2017 [employer].

## 2020-12-23 NOTE — Progress Notes (Signed)
Subjective:   Andrea [name] is a 86 y.o. female who presents for Medicare Annual (Subsequent) preventive examination.  Review of Systems           Objective:    There were no vitals filed for this visit. There is no height or weight on file to calculate BMI.  Advanced Directives 12/23/2020 11/25/2020 07/22/2020 04/21/2020 12/04/2019 11/25/2019 08/28/2019  Does Patient Have a Medical Advance Directive? Yes Yes Yes Yes Yes Yes Yes  Type of Advance Directive Out of facility DNR (pink MOST or yellow form) Out of facility DNR (pink MOST or yellow form) Out of facility DNR (pink MOST or yellow form) Out of facility DNR (pink MOST or yellow form) Out of facility DNR (pink MOST or yellow form) Out of facility DNR (pink MOST or yellow form) Out of facility DNR (pink MOST or yellow form)  Does patient want to make changes to medical advance directive? No - Patient declined No - Patient declined No - Patient declined No - Patient declined No - Patient declined No - Patient declined No - Patient declined  Copy of [place] in [place]  Would patient like information on creating a medical advance directive? - - - - - - -  Pre-existing out of facility DNR order (yellow form or pink MOST form) Yellow form placed in chart (order not valid for inpatient use) Pink MOST/Yellow Form most recent copy in chart - Physician notified to receive inpatient order Pink MOST/Yellow Form most recent copy in chart - Physician notified to receive inpatient order - Yellow form placed in chart (order not valid for inpatient use) Yellow form placed in chart (order not valid for inpatient use) -    Current Medications (verified) Outpatient Encounter Medications as of 12/23/2020  Medication Sig  . albuterol (VENTOLIN HFA) 108 (90 Base) MCG/ACT inhaler Inhale 2 puffs into the lungs every 6 (six) hours as needed for wheezing or shortness of breath.  . Brexpiprazole 1 MG TABS Take 1 mg by mouth at  bedtime.   . busPIRone (BUSPAR) 5 MG tablet Take 5-10 mg by mouth 2 (two) times daily. 10 MG IN THE MORNING, 5 MG AT DINNER  . CALCIUM CITRATE PO Take 600 mg by mouth daily.   . clonazePAM (KLONOPIN) 0.5 MG tablet Take 1 tablet by mouth as needed in middle of the nite to go back to sleep  . CRANBERRY PO Take 2 tablets by mouth daily.  . D-Mannose 500 MG CAPS Take by mouth daily.  . DULoxetine (CYMBALTA) 60 MG capsule Take 1 capsule (60 mg total) by mouth daily.  [name] ELIQUIS 5 MG TABS tablet TAKE 1 TABLET BY MOUTH TWICE A DAY  . furosemide (LASIX) 20 MG tablet Take 1 tablet (20 mg total) by mouth daily.  [name] KLOR-CON M20 20 MEQ tablet TAKE 1 TABLET BY MOUTH EVERY DAY  . levothyroxine (SYNTHROID) 50 MCG tablet Take 1 tablet (50 mcg total) by mouth daily before breakfast.  . mirtazapine (REMERON) 15 MG tablet Take 7.5 mg by mouth at bedtime.  . Multiple Vitamins-Minerals (MULTIVITAMIN PO) Take 1 tablet by mouth daily.   [name] oxybutynin (DITROPAN-XL) 10 MG 24 hr tablet Take 10 mg by mouth daily.  [name] oxyCODONE-acetaminophen (PERCOCET) 5-325 MG tablet Take 1 tablet by mouth every 4 (four) hours as needed for severe pain.  [name] senna-docusate (SENOKOT-S) 8.6-50 MG tablet Take 1 tablet by mouth 2 (two) times daily as  needed for mild constipation.  . traZODone (DESYREL) 100 MG tablet Take 100 mg by mouth at bedtime.  . vitamin B-12 (CYANOCOBALAMIN) 500 MCG tablet Take 500 mcg by mouth daily.   No facility-administered encounter medications on file as of 12/23/2020.    Allergies (verified) Clindamycin hcl   History: Past Medical History:  Diagnosis Date  . A-fib ([place])   . Anemia   . Anxiety   . Arthritis    hands, spine   . Bladder incontinence   . Blood transfusion   . Cancer ([place]) 02/2019   left breast cancer  . Cervicalgia   . Constipation   . Cough    [place]-onset 08/2009, as of 2014- resolved   . Deep venous thrombosis ([place])    post op, rec'd/needed  blood thinner   . Depression   .  Depression with anxiety   . Dysrhythmia    a-fib  . Falls frequently    pt. reports that she was falling 3 times a day, has had PT at a facility for a while & now is getting home PT 2-3 times/ week   . Headache(784.0) 09/27/2011  . History of stress test    15-20 yrs.ago   . Hypotension   . Hypothyroidism   . Lumbar radiculopathy 09/26/2011  . Pneumonia    never been in hosp. for pneumonia   . Prediabetes   . Right hand fracture    Monday  . Shortness of breath   . Sleep apnea    does not use consistently   Past Surgical History:  Procedure Laterality Date  . ANTERIOR CERVICAL DECOMP/DISCECTOMY FUSION N/A 07/09/2013   Procedure: ANTERIOR CERVICAL DECOMPRESSION/DISCECTOMY FUSION 3 LEVELS;  Surgeon: Sinclair Ship, MD;  Location: [facility];  Service: Orthopedics;  Laterality: N/A;  Anterior cervical decompression fusion, cervical 3-4, cervical 4-5-, cervical 5-6 with instrumentation, allograft.  . APPENDECTOMY    . BACK SURGERY     2000  . BREAST EXCISIONAL BIOPSY     breast bxs on left x 2, breast bx of right x 1-all benign  . EYE SURGERY     cataracts bilateral /w IOL  . HERNIA REPAIR     umbilical hernia- 1610  . IR IMAGING GUIDED PORT INSERTION  04/28/2019  . JOINT REPLACEMENT  2006,2007   bilateral  . LAPAROSCOPIC OVARIAN CYSTECTOMY     not done by laparscopy-abdominal incision  . LEG SURGERY    . ORIF PERIPROSTHETIC FRACTURE Right 07/26/2019   Procedure: OPEN REDUCTION INTERNAL FIXATION (ORIF) RIGHT FEMUR PERIPROSTHETIC FRACTURE;  Surgeon: Rod Can, MD;  Location: [facility];  Service: Orthopedics;  Laterality: Right;  . PORTACATH PLACEMENT Right 04/01/2019   Procedure: ATTEMPTED INSERTION PORT-A-CATH WITH ULTRASOUND;  Surgeon: [name], MD;  Location: [facility];  Service: General;  Laterality: Right;  . RADIOACTIVE SEED GUIDED PARTIAL MASTECTOMY WITH AXILLARY SENTINEL LYMPH NODE BIOPSY Left 04/01/2019   Procedure: LEFT BREAST PARTIAL MASTECTOMY  WITH RADIOACTIVE SEED AND LEFT SENTINEL LYMPH NODE BIOPSY;  Surgeon: [name], MD;  Location: [facility];  Service: General;  Laterality: Left;  . RE-EXCISION OF BREAST CANCER,SUPERIOR MARGINS Left 04/15/2019   Procedure: RE-EXCISION OF LEFT BREAST CANCER POSITIVE MARGINS;  Surgeon: [name], MD;  Location: [facility];  Service: General;  Laterality: Left;  . REPLACEMENT TOTAL KNEE BILATERAL    . TONSILLECTOMY     Family History  Problem Relation Age of Onset  . Heart disease Father   . Malignant hyperthermia  Father   . Arthritis Father   . Deep vein thrombosis Son   . Diabetes Son   . Obesity Son   . Depression Son   . Hypertension Son   . Parkinson's disease Mother   . Diabetes Daughter   . Hypertension Daughter   . Anxiety disorder Daughter   . Hyperlipidemia Son   . Hypertension Son   . Cancer Maternal Aunt    Social History   Socioeconomic History  . Marital status: Single    Spouse name: Not on file  . Number of children: 3  . Years of education: College  . Highest education level: Not on file  Occupational History  . Occupation: Retired Therapist, sports  Tobacco Use  . Smoking status: Current Some Day Smoker    Packs/day: 0.25    Years: 20.00    Pack years: 5.00    Types: Cigarettes  . Smokeless tobacco: Never Used  . Tobacco comment: 1-2 cigarettes a day  Vaping Use  . Vaping Use: Never used  Substance and Sexual Activity  . Alcohol use: Yes    Comment: Glass of wine 1-2 times a year at Christmas  . Drug use: No  . Sexual activity: Never  Other Topics Concern  . Not on file  Social History Narrative   Patient is single and lives alone.   Patient is retired.   Patient has a college education.   Patient has three adult children   Patient is right-handed.   Patient drinks 3-4 cups of caffeine (soda and coffee) daily.   Walks with walker   Social Determinants of Health   Financial Resource Strain: Not on file  Food  Insecurity: Not on file  Transportation Needs: Not on file  Physical Activity: Not on file  Stress: Not on file  Social Connections: Not on file    Tobacco Counseling Ready to quit: Not Answered Counseling given: Not Answered Comment: 1-2 cigarettes a day   Clinical Intake:  Pre-visit preparation completed: Yes  Pain : No/denies pain     BMI - recorded: 34 Nutritional Risks: None Diabetes: No  How often do you need to have someone help you when you read instructions, pamphlets, or other written materials from your doctor or pharmacy?: 5 - Always What is the last grade level you completed in school?: college  Diabetic?no         Activities of Daily Living No flowsheet data found.  Patient Care Team: Gayland Curry, DO as PCP - General (Geriatric Medicine) Tanda Rockers, MD as Consulting Physician (Pulmonary Disease) Deveron Furlong, NP as Nurse Practitioner (Psychiatry)  Indicate any recent Medical Services you may have received from other than [facility] providers in the past year (date may be approximate).     Assessment:   This is a routine wellness examination for [facility].  Hearing/Vision screen  Hearing Screening   125Hz  250Hz  500Hz  1000Hz  2000Hz  3000Hz  4000Hz  6000Hz  8000Hz   Right ear:           Left ear:           Comments: Patient wears hearing aids  Vision Screening Comments: Patient wears glasses. Patient has had eye exam within past year. Patient sees Dr. Mancel Bale.  Dietary issues and exercise activities discussed:    Goals    . Quit Smoking    . Weight (lb) < 150 lb (68 kg)     Starting 11/27/2016, I will attempt to decrease my weight by 50 lbs to reach a goal  weight of 150 lb.  12/10/2018 Current exercise limited by lower back pain and left shoulder pain     . Weight (lb) < 170 lb (77.1 kg)     Pt will start going for walks to lose weight      Depression Screen PHQ 2/9 Scores 12/23/2020 11/25/2020 07/22/2020 12/04/2019 07/03/2019 02/27/2019 12/10/2018   PHQ - 2 Score 0 0 0 0 0 0 0  PHQ- 9 Score - - - - - - -    Fall Risk Fall Risk  12/23/2020 11/25/2020 07/22/2020 04/21/2020 12/04/2019  Falls in the past year? 1 1 1 1 1   Number falls in past yr: 1 0 1 1 0  Comment - - Care giver stated 6 falls - -  Injury with Fall? 0 0 0 0 0  Comment - - - - -  Risk for fall due to : - - - - -  Follow up - - - - -    [place]:  Any stairs in or around the home? No  If so, are there any without handrails? No  Home free of loose throw rugs in walkways, pet beds, electrical cords, etc? No  Adequate lighting in your home to reduce risk of falls? Yes   ASSISTIVE DEVICES UTILIZED TO PREVENT FALLS:  Life alert? Yes  Use of a cane, walker or w/c? Yes  Grab bars in the bathroom? No  Shower chair or bench in shower? No  Elevated toilet seat or a handicapped toilet? Yes   TIMED UP AND GO:  Was the test performed? No .    Cognitive Function: MMSE - Mini Mental State Exam 12/10/2018 12/03/2017 11/27/2016 03/05/2014  Orientation to time 5 5 5 5   Orientation to Place 4 4 5 5   Registration 3 3 3 3   Attention/ Calculation 4 5 5 5   Recall 2 1 3 3   Language- name 2 objects 2 2 2 2   Language- repeat 1 1 1 1   Language- follow 3 step command 2 3 3 3   Language- read & follow direction 1 1 1 1   Write a sentence 1 1 1 1   Copy design 0 1 1 1   Total score 25 27 30 30      6CIT Screen 12/23/2020  What Year? 0 points  What month? 0 points  What time? 0 points  Count back from 20 4 points  Months in reverse 4 points  Repeat phrase 10 points  Total Score 18    Immunizations Immunization History  Administered Date(s) Administered  . Fluad Quad(high Dose 65+) 07/22/2020  . Influenza Split 08/14/2013  . Influenza Whole 08/20/2009  . Influenza, High Dose Seasonal PF 09/11/2017, 07/25/2018  . Influenza,inj,Quad PF,6+ Mos 08/04/2014, 09/06/2015, 07/10/2016  . PFIZER(Purple Top)SARS-COV-2 Vaccination 12/27/2019, 01/17/2020,  08/30/2020  . Pneumococcal Conjugate-13 01/14/2015  . Pneumococcal Polysaccharide-23 07/11/2013  . Td 11/20/2005, 11/27/2016    TDAP status: Up to date  Flu Vaccine status: Up to date  Pneumococcal vaccine status: Up to date  Covid-19 vaccine status: Completed vaccines  Qualifies for Shingles Vaccine? Yes   Zostavax completed No   Shingrix Completed?: No.    Education has been provided regarding the importance of this vaccine. Patient has been advised to call insurance company to determine out of pocket expense if they have not yet received this vaccine. Advised may also receive vaccine at local pharmacy or Health Dept. Verbalized acceptance and understanding.  Screening Tests Health Maintenance  Topic Date  Due  . OPHTHALMOLOGY EXAM  02/16/2021  . COVID-19 Vaccine (4 - Booster for Pfizer series) 02/28/2021  . HEMOGLOBIN A1C  05/25/2021  . FOOT EXAM  07/22/2021  . URINE MICROALBUMIN  11/26/2021  . TETANUS/TDAP  11/27/2026  . INFLUENZA VACCINE  Completed  . DEXA SCAN  Completed  . PNA vac Low Risk Adult  Completed    Health Maintenance  There are no preventive care reminders to display for this patient.  Colorectal cancer screening: No longer required.   Mammogram status: No longer required due to age.  Bone Density status: Ordered today. Pt provided with contact info and advised to call to schedule appt.  Lung Cancer Screening: (Low Dose CT Chest recommended if Age 47-80 years, 30 pack-year currently smoking OR have quit w/in 15years.) does not qualify.   Additional Screening:  Hepatitis C Screening: does not qualify;   Vision Screening: Recommended annual ophthalmology exams for early detection of glaucoma and other disorders of the eye. Is the patient up to date with their annual eye exam?  Yes  Who is the provider or what is the name of the office in which the patient attends annual eye exams? Dr Mancel Bale If pt is not established with a provider, would they like to  be referred to a provider to establish care? No .   Dental Screening: Recommended annual dental exams for proper oral hygiene  Community Resource Referral / Chronic Care Management: CRR required this visit?  No   CCM required this visit?  No      Plan:     I have personally reviewed and noted the following in the patient's chart:   . Medical and social history . Use of alcohol, tobacco or illicit drugs  . Current medications and supplements . Functional ability and status . Nutritional status . Physical activity . Advanced directives . List of other physicians . Hospitalizations, surgeries, and ER visits in previous 12 months . Vitals . Screenings to include cognitive, depression, and falls . Referrals and appointments  In addition, I have reviewed and discussed with patient certain preventive protocols, quality metrics, and best practice recommendations. A written personalized care plan for preventive services as well as general preventive health recommendations were provided to patient.     Lauree Chandler, NP   12/23/2020    Virtual Visit via Telephone Note  I connected with@ on 12/23/20 at 10:30 AM EST by telephone and verified that I am speaking with the correct person using two identifiers.  Location: Patient: home Provider: [facility]   I discussed the limitations, risks, security and privacy concerns of performing an evaluation and management service by telephone and the availability of in person appointments. I also discussed with the patient that there may be a patient responsible charge related to this service. The patient expressed understanding and agreed to proceed.   I discussed the assessment and treatment plan with the patient. The patient was provided an opportunity to ask questions and all were answered. The patient agreed with the plan and demonstrated an understanding of the instructions.   The patient was advised to call back or seek an in-person  evaluation if the symptoms worsen or if the condition fails to improve as anticipated.  I provided 18 minutes of non-face-to-face time during this encounter.  Carlos American. Harle Battiest Avs printed and mailed

## 2020-12-28 ENCOUNTER — Other Ambulatory Visit: Payer: Self-pay | Admitting: *Deleted

## 2020-12-28 DIAGNOSIS — Z8781 Personal history of (healed) traumatic fracture: Secondary | ICD-10-CM

## 2020-12-28 MED ORDER — OXYCODONE-ACETAMINOPHEN 5-325 MG PO TABS: 1.0000 | ORAL_TABLET | ORAL | 0 refills | Status: DC | PRN

## 2020-12-28 NOTE — Telephone Encounter (Signed)
[name] called and requested refill No Contract on File Epic LR: 07/22/2020 Pended Rx and sent to Dr. Mariea Clonts for approval.

## 2021-01-10 ENCOUNTER — Encounter: Payer: Self-pay | Admitting: Internal Medicine

## 2021-01-13 ENCOUNTER — Other Ambulatory Visit: Payer: Self-pay | Admitting: Internal Medicine

## 2021-01-13 DIAGNOSIS — I739 Peripheral vascular disease, unspecified: Secondary | ICD-10-CM | POA: Diagnosis not present

## 2021-01-13 DIAGNOSIS — Z79899 Other long term (current) drug therapy: Secondary | ICD-10-CM

## 2021-01-13 DIAGNOSIS — L603 Nail dystrophy: Secondary | ICD-10-CM | POA: Diagnosis not present

## 2021-01-20 ENCOUNTER — Ambulatory Visit (INDEPENDENT_AMBULATORY_CARE_PROVIDER_SITE_OTHER): Payer: Medicare Other | Admitting: Internal Medicine

## 2021-01-20 ENCOUNTER — Other Ambulatory Visit: Payer: Self-pay

## 2021-01-20 ENCOUNTER — Encounter: Payer: Self-pay | Admitting: Internal Medicine

## 2021-01-20 VITALS — BP 118/82 | HR 68 | Temp 97.3°F | Ht 62.0 in | Wt 198.0 lb

## 2021-01-20 DIAGNOSIS — R569 Unspecified convulsions: Secondary | ICD-10-CM | POA: Diagnosis not present

## 2021-01-20 DIAGNOSIS — Z170 Estrogen receptor positive status [ER+]: Secondary | ICD-10-CM

## 2021-01-20 DIAGNOSIS — D649 Anemia, unspecified: Secondary | ICD-10-CM | POA: Diagnosis not present

## 2021-01-20 DIAGNOSIS — C50412 Malignant neoplasm of upper-outer quadrant of left female breast: Secondary | ICD-10-CM

## 2021-01-20 LAB — COMPLETE METABOLIC PANEL WITH GFR
AG Ratio: 1.4 (calc) (ref 1.0–2.5)
ALT: 6 U/L (ref 6–29)
AST: 13 U/L (ref 10–35)
Albumin: 3.7 g/dL (ref 3.6–5.1)
Alkaline phosphatase (APISO): 70 U/L (ref 37–153)
BUN/Creatinine Ratio: 26 (calc) — ABNORMAL HIGH (ref 6–22)
BUN: 24 mg/dL (ref 7–25)
CO2: 29 mmol/L (ref 20–32)
Calcium: 8.9 mg/dL (ref 8.6–10.4)
Chloride: 101 mmol/L (ref 98–110)
Creat: 0.94 mg/dL — ABNORMAL HIGH (ref 0.60–0.88)
GFR, Est African American: 65 mL/min/{1.73_m2} (ref >=60)
GFR, Est Non African American: 56 mL/min/{1.73_m2} — ABNORMAL LOW (ref >=60)
Globulin: 2.6 g/dL (calc) (ref 1.9–3.7)
Glucose, Bld: 98 mg/dL (ref 65–139)
Potassium: 4.2 mmol/L (ref 3.5–5.3)
Sodium: 138 mmol/L (ref 135–146)
Total Bilirubin: 0.3 mg/dL (ref 0.2–1.2)
Total Protein: 6.3 g/dL (ref 6.1–8.1)

## 2021-01-20 LAB — CBC WITH DIFFERENTIAL/PLATELET
Absolute Monocytes: 456 cells/uL (ref 200–950)
Basophils Absolute: 42 cells/uL (ref 0–200)
Basophils Relative: 0.7 %
Eosinophils Absolute: 138 cells/uL (ref 15–500)
Eosinophils Relative: 2.3 %
HCT: 33.3 % — ABNORMAL LOW (ref 35.0–45.0)
Hemoglobin: 10.7 g/dL — ABNORMAL LOW (ref 11.7–15.5)
Lymphs Abs: 1272 cells/uL (ref 850–3900)
MCH: 26.4 pg — ABNORMAL LOW (ref 27.0–33.0)
MCHC: 32.1 g/dL (ref 32.0–36.0)
MCV: 82 fL (ref 80.0–100.0)
MPV: 9.4 fL (ref 7.5–12.5)
Monocytes Relative: 7.6 %
Neutro Abs: 4092 cells/uL (ref 1500–7800)
Neutrophils Relative %: 68.2 %
Platelets: 307 10*3/uL (ref 140–400)
RBC: 4.06 10*6/uL (ref 3.80–5.10)
RDW: 13.6 % (ref 11.0–15.0)
Total Lymphocyte: 21.2 %
WBC: 6 10*3/uL (ref 3.8–10.8)

## 2021-01-20 NOTE — Progress Notes (Signed)
Location:  [facility] clinic Provider:  Camara Renstrom L. Mariea Clonts, D.O., C.M.D.  Code Status: DNR Goals of Care:  Advanced Directives 12/23/2020  Does Patient Have a Medical Advance Directive? Yes  Type of Advance Directive Out of facility DNR (pink MOST or yellow form)  Does patient want to make changes to medical advance directive? No - Patient declined  Copy of [place] in Chart? -  Would patient like information on creating a medical advance directive? -  Pre-existing out of facility DNR order (yellow form or pink MOST form) Yellow form placed in chart (order not valid for inpatient use)     Chief Complaint  Patient presents with  . Acute Visit    Complains of shaking uncontrollably this morning around 5am, unsure if it wasn't a seizure. Blood Pressure running low, lowest reading has been 102/51 with pulse of 62.     HPI: Patient is a 86 y.o. female seen today for  Acute visit about 5am episode.  She wsa standing up with her daughter.  Suddenly, her chest felt heavy.  She started clawing her hands in the air and flung herself backward.  They have a camera on her ar night.   She doesn't know what happened.  It was uncontrollable.  Looked like a sizure. When she stands, she gets nervous and anxious and almost like a small panic attack.  It doesn't last long.  Episode in the hosipta when she broke her hip looked similar.  She lost bladder control of bladder and everything during it.  She did not know what was going on.  She said to her daughter that someone was trying to get her.  She also felt heavy in her chest and she felt like she was going to pass out after the spell.    Urine was not cloudy, looked normal.  She had 1/2 lasix yesterday.  Did drink 35 oz water about.    Her breathing has been bothersome to her for about a year or so and that's when the lasix got added.  CXR in June had small pleural effusion.  They're doing lasix qod.  If she takes it daily it totally wears her  out.  Uses pad and adult diapers.    Past Medical History:  Diagnosis Date  . A-fib ([place])   . Anemia   . Anxiety   . Arthritis    hands, spine   . Bladder incontinence   . Blood transfusion   . Cancer ([place]) 02/2019   left breast cancer  . Cervicalgia   . Constipation   . Cough    [place]ert-onset 08/2009, as of 2014- resolved   . Deep venous thrombosis ([place])    post op, rec'd/needed  blood thinner   . Depression   . Depression with anxiety   . Dysrhythmia    a-fib  . Falls frequently    pt. reports that she was falling 3 times a day, has had PT at a facility for a while & now is getting home PT 2-3 times/ week   . Headache(784.0) 09/27/2011  . History of stress test    15-20 yrs.ago   . Hypotension   . Hypothyroidism   . Lumbar radiculopathy 09/26/2011  . Pneumonia    never been in hosp. for pneumonia   . Prediabetes   . Right hand fracture    Monday  . Shortness of breath   . Sleep apnea    does not use consistently  Past Surgical History:  Procedure Laterality Date  . ANTERIOR CERVICAL DECOMP/DISCECTOMY FUSION N/A 07/09/2013   Procedure: ANTERIOR CERVICAL DECOMPRESSION/DISCECTOMY FUSION 3 LEVELS;  Surgeon: Sinclair Ship, MD;  Location: [place];  Service: Orthopedics;  Laterality: N/A;  Anterior cervical decompression fusion, cervical 3-4, cervical 4-5-, cervical 5-6 with instrumentation, allograft.  . APPENDECTOMY    . BACK SURGERY     2000  . BREAST EXCISIONAL BIOPSY     breast bxs on left x 2, breast bx of right x 1-all benign  . EYE SURGERY     cataracts bilateral /w IOL  . HERNIA REPAIR     umbilical hernia- 1610  . IR IMAGING GUIDED PORT INSERTION  04/28/2019  . JOINT REPLACEMENT  2006,2007   bilateral  . LAPAROSCOPIC OVARIAN CYSTECTOMY     not done by laparscopy-abdominal incision  . LEG SURGERY    . ORIF PERIPROSTHETIC FRACTURE Right 07/26/2019   Procedure: OPEN REDUCTION INTERNAL FIXATION (ORIF) RIGHT FEMUR PERIPROSTHETIC FRACTURE;  Surgeon: Rod Can, MD;  Location: [place];  Service: Orthopedics;  Laterality: Right;  . PORTACATH PLACEMENT Right 04/01/2019   Procedure: ATTEMPTED INSERTION PORT-A-CATH WITH ULTRASOUND;  Surgeon: [name], MD;  Location: [place];  Service: General;  Laterality: Right;  . RADIOACTIVE SEED GUIDED PARTIAL MASTECTOMY WITH AXILLARY SENTINEL LYMPH NODE BIOPSY Left 04/01/2019   Procedure: LEFT BREAST PARTIAL MASTECTOMY WITH RADIOACTIVE SEED AND LEFT SENTINEL LYMPH NODE BIOPSY;  Surgeon: [name], MD;  Location: [place];  Service: General;  Laterality: Left;  . RE-EXCISION OF BREAST CANCER,SUPERIOR MARGINS Left 04/15/2019   Procedure: RE-EXCISION OF LEFT BREAST CANCER POSITIVE MARGINS;  Surgeon: [name], MD;  Location: [place];  Service: General;  Laterality: Left;  . REPLACEMENT TOTAL KNEE BILATERAL    . TONSILLECTOMY      Allergies  Allergen Reactions  . Clindamycin Hcl Other (See Comments)    REACTION: swelling, pt. Reports that she had a 16 lb. weightgain in one day    Outpatient Encounter Medications as of 01/20/2021  Medication Sig  . albuterol (VENTOLIN HFA) 108 (90 Base) MCG/ACT inhaler Inhale 2 puffs into the lungs every 6 (six) hours as needed for wheezing or shortness of breath.  . Brexpiprazole 1 MG TABS Take 1 mg by mouth at bedtime.   . busPIRone (BUSPAR) 5 MG tablet Take 5-10 mg by mouth 2 (two) times daily. 10 MG IN THE MORNING, 5 MG AT DINNER  . CALCIUM CITRATE PO Take 600 mg by mouth daily.   . clonazePAM (KLONOPIN) 0.5 MG tablet Take 1 tablet by mouth as needed in middle of the nite to go back to sleep  . CRANBERRY PO Take 2 tablets by mouth daily.  . D-Mannose 500 MG CAPS Take by mouth daily.  . DULoxetine (CYMBALTA) 60 MG capsule Take 1 capsule (60 mg total) by mouth daily.  [name] ELIQUIS 5 MG TABS tablet TAKE 1 TABLET BY MOUTH TWICE A DAY  . furosemide (LASIX) 20 MG tablet Take 1 tablet (20 mg total) by mouth  daily.  [name] KLOR-CON M20 20 MEQ tablet TAKE 1 TABLET BY MOUTH EVERY DAY  . levothyroxine (SYNTHROID) 50 MCG tablet Take 1 tablet (50 mcg total) by mouth daily before breakfast.  . mirtazapine (REMERON) 15 MG tablet Take 7.5 mg by mouth at bedtime.  . Multiple Vitamins-Minerals (MULTIVITAMIN PO) Take 1 tablet by mouth daily.   [name] oxybutynin (DITROPAN-XL) 10 MG 24 hr tablet Take 10 mg by  mouth daily.  [name] oxyCODONE-acetaminophen (PERCOCET) 5-325 MG tablet Take 1 tablet by mouth every 4 (four) hours as needed for severe pain.  [name] senna-docusate (SENOKOT-S) 8.6-50 MG tablet Take 1 tablet by mouth 2 (two) times daily as needed for mild constipation.  . traZODone (DESYREL) 100 MG tablet Take 100 mg by mouth at bedtime.  . vitamin B-12 (CYANOCOBALAMIN) 500 MCG tablet Take 500 mcg by mouth daily.   No facility-administered encounter medications on file as of 01/20/2021.    Review of Systems:  Review of Systems  Constitutional: Positive for malaise/fatigue. Negative for chills and fever.  HENT: Positive for hearing loss. Negative for congestion and sore throat.   Eyes: Negative for blurred vision.  Respiratory: Negative for cough and shortness of breath.   Cardiovascular: Negative for chest pain, palpitations and leg swelling.  Gastrointestinal: Negative for abdominal pain.  Genitourinary: Negative for dysuria.  Musculoskeletal: Positive for back pain. Negative for falls.  Skin: Negative for itching and rash.  Neurological: Positive for seizures, loss of consciousness and weakness. Negative for dizziness.  Endo/Heme/Allergies: Bruises/bleeds easily.  Psychiatric/Behavioral: Positive for depression and memory loss. The patient is nervous/anxious and has insomnia.     Health Maintenance  Topic Date Due  . OPHTHALMOLOGY EXAM  02/16/2021  . COVID-19 Vaccine (4 - Booster for Pfizer series) 02/28/2021  . HEMOGLOBIN A1C  05/25/2021  . FOOT EXAM  07/22/2021  . URINE MICROALBUMIN  11/26/2021  .  TETANUS/TDAP  11/27/2026  . INFLUENZA VACCINE  Completed  . DEXA SCAN  Completed  . PNA vac Low Risk Adult  Completed  . HPV VACCINES  Aged Out    Physical Exam: Vitals:   01/20/21 1154  BP: 118/82  Pulse: 68  Temp: (!) 97.3 F (36.3 C)  TempSrc: Skin  SpO2: 98%  Weight: 198 lb (89.8 kg)  Height: 5\' 2"  (1.575 m)   Body mass index is 36.21 kg/m. Physical Exam Vitals reviewed.  Constitutional:      General: She is not in acute distress.    Appearance: She is obese. She is not toxic-appearing.  HENT:     Head: Normocephalic and atraumatic.  Eyes:     Comments: glasses  Cardiovascular:     Rate and Rhythm: Rhythm irregular.     Heart sounds: No murmur heard.   Pulmonary:     Effort: Pulmonary effort is normal.     Breath sounds: Normal breath sounds. No wheezing.  Abdominal:     General: Bowel sounds are normal. There is no distension.     Palpations: Abdomen is soft.     Tenderness: There is no abdominal tenderness. There is no guarding or rebound.  Musculoskeletal:        General: Normal range of motion.     Right lower leg: No edema.     Left lower leg: No edema.  Skin:    General: Skin is warm and dry.     Capillary Refill: Capillary refill takes more than 3 seconds.     Comments: Dry, tenting; also dry mucus membranes  Neurological:     General: No focal deficit present.     Mental Status: She is alert and oriented to person, place, and time.  Psychiatric:        Mood and Affect: Mood normal.        Behavior: Behavior normal.     Labs reviewed: Basic Metabolic Panel: Recent Labs    05/07/20 0956 07/22/20 1139 11/25/20 1220  NA 139 139  139  K 4.2 4.2 4.2  CL 101 102 102  CO2 26 26 28   GLUCOSE 117* 94 101  BUN 30* 22 28*  CREATININE 0.76 0.81 0.90*  CALCIUM 9.6 9.1 9.6  TSH  --   --  1.05   Liver Function Tests: Recent Labs    04/22/20 1240 11/25/20 1220  AST 14* 13  ALT 7 7  ALKPHOS 101  --   BILITOT 0.3 0.3  PROT 6.9 6.3  ALBUMIN  3.2*  --    No results for input(s): LIPASE, AMYLASE in the last 8760 hours. No results for input(s): AMMONIA in the last 8760 hours. CBC: Recent Labs    04/22/20 1240 11/25/20 1220  WBC 7.9 5.9  NEUTROABS 6.3 4,219  HGB 9.6* 11.2*  HCT 31.7* 35.0  MCV 82.1 80.8  PLT 313 289   Lipid Panel: No results for input(s): CHOL, HDL, LDLCALC, TRIG, CHOLHDL, LDLDIRECT in the last 8760 hours. Lab Results  Component Value Date   HGBA1C 5.3 11/25/2020    Procedures since last visit: No results found.  Assessment/Plan 1. Seizure ([place]) -certainly sounds like she had a recurrence of seizure -r/o electrolyte abnormality or acute anemia as cause -had one episode in hospital before so if no electrolyte explanation found, would initiate keppra tx to prevent recurrence -hopefully not metastatic disease  - CBC with Differential/Platelet - COMPLETE METABOLIC PANEL WITH GFR  2. Postoperative anemia - had after hip fx, f/u labs - CBC with Differential/Platelet  3. Malignant neoplasm of upper-outer quadrant of left breast in female, estrogen receptor positive ([place]) -has had episodes of chest pain--history not entirely clear, but seems to start with the tenderness of her left breast that she's been having that she opted not to look into further -we discussed her goals again today and she does not want more cardiac testing and would not want cath, stents, or surgery if indicated at this time with all of her other medical illnesses -offered palliative care at home and patient was open to it  Labs/tests ordered:   Lab Orders     CBC with Differential/Platelet     COMPLETE METABOLIC PANEL WITH GFR  Next appt:  Caregiver chose Dinah NP for them to f/u with as she's seen them before  [place]. Dametri Ozburn, D.O. [facility] [address], [place] [address] Cell Phone (Mon-Fri 8am-5pm):  [phone] On Call:  [phone] & follow prompts after 5pm  & weekends Office Phone:  [phone] Office Fax:  [phone]

## 2021-01-21 ENCOUNTER — Other Ambulatory Visit: Payer: Self-pay

## 2021-01-21 MED ORDER — LEVETIRACETAM 500 MG PO TABS: 500.0000 mg | ORAL_TABLET | Freq: Two times a day (BID) | ORAL | 5 refills | Status: DC

## 2021-01-21 NOTE — Progress Notes (Signed)
She does appear a bit dehydrated, but not severely.  Hydration with mostly water and just a small glass of gatorade each day should help keep this in check.  I still do suspect she had a seizure based upon the history.  I recommend she take keppra 500mg  twice daily to prevent recurrence because she's now had two events.

## 2021-02-02 DIAGNOSIS — F332 Major depressive disorder, recurrent severe without psychotic features: Secondary | ICD-10-CM | POA: Diagnosis not present

## 2021-02-02 DIAGNOSIS — F341 Dysthymic disorder: Secondary | ICD-10-CM | POA: Diagnosis not present

## 2021-02-02 DIAGNOSIS — F411 Generalized anxiety disorder: Secondary | ICD-10-CM | POA: Diagnosis not present

## 2021-02-23 DIAGNOSIS — Z23 Encounter for immunization: Secondary | ICD-10-CM | POA: Diagnosis not present

## 2021-02-28 ENCOUNTER — Encounter: Payer: Self-pay | Admitting: Family

## 2021-02-28 ENCOUNTER — Ambulatory Visit (INDEPENDENT_AMBULATORY_CARE_PROVIDER_SITE_OTHER): Payer: Medicare Other | Admitting: Family

## 2021-02-28 ENCOUNTER — Other Ambulatory Visit: Payer: Self-pay

## 2021-02-28 VITALS — BP 90/60 | HR 60 | Temp 96.9°F | Resp 16 | Ht 62.0 in | Wt 203.0 lb

## 2021-02-28 DIAGNOSIS — N3281 Overactive bladder: Secondary | ICD-10-CM | POA: Diagnosis not present

## 2021-02-28 MED ORDER — MIRABEGRON ER 25 MG PO TB24: 25.0000 mg | ORAL_TABLET | Freq: Every day | ORAL | 0 refills | Status: DC

## 2021-02-28 NOTE — Patient Instructions (Addendum)
Home health Physical Therapy ordered for Kigel exercises.Home health Agency will call you for appintment. Please collect urine specimen and return to office to check for any urianry tract infection   Overactive Bladder, Adult  Overactive bladder is a condition in which a person has a sudden and frequent need to urinate. A person might also leak urine if he or she cannot get to the bathroom fast enough (urinary incontinence). Sometimes, symptoms can interfere with work or social activities. What are the causes? Overactive bladder is associated with poor nerve signals between your bladder and your brain. Your bladder may get the signal to empty before it is full. You may also have very sensitive muscles that make your bladder squeeze too soon. This condition may also be caused by other factors, such as:  Medical conditions: ? Urinary tract infection. ? Infection of nearby tissues. ? Prostate enlargement. ? Bladder stones, inflammation, or tumors. ? Diabetes. ? Muscle or nerve weakness, especially from these conditions:  A spinal cord injury.  Stroke.  Multiple sclerosis.  Parkinson's disease.  Other causes: ? Surgery on the uterus or urethra. ? Drinking too much caffeine or alcohol. ? Certain medicines, especially those that eliminate extra fluid in the body (diuretics). ? Constipation. What increases the risk? You may be at greater risk for overactive bladder if you:  Are an older adult.  Smoke.  Are going through menopause.  Have prostate problems.  Have a neurological disease, such as stroke, dementia, Parkinson's disease, or multiple sclerosis (MS).  Eat or drink alcohol, spicy food, caffeine, and other things that irritate the bladder.  Are overweight or obese. What are the signs or symptoms? Symptoms of this condition include a sudden, strong urge to urinate. Other symptoms include:  Leaking urine.  Urinating 8 or more times a day.  Waking up to urinate 2 or  more times overnight. How is this diagnosed? This condition may be diagnosed based on:  Your symptoms and medical history.  A physical exam.  Blood or urine tests to check for possible causes, such as infection. You may also need to see a health care provider who specializes in urinary tract problems. This is called a urologist. How is this treated? Treatment for overactive bladder depends on the cause of your condition and whether it is mild or severe. Treatment may include:  Bladder training, such as: ? Learning to control the urge to urinate by following a schedule to urinate at regular intervals. ? Doing Kegel exercises to strengthen the pelvic floor muscles that support your bladder.  Special devices, such as: ? Biofeedback. This uses sensors to help you become aware of your body's signals. ? Electrical stimulation. This uses electrodes placed inside the body (implanted) or outside the body. These electrodes send gentle pulses of electricity to strengthen the nerves or muscles that control the bladder. ? Women may use a plastic device, called a pessary, that fits into the vagina and supports the bladder.  Medicines, such as: ? Antibiotics to treat bladder infection. ? Antispasmodics to stop the bladder from releasing urine at the wrong time. ? Tricyclic antidepressants to relax bladder muscles. ? Injections of botulinum toxin type A directly into the bladder tissue to relax bladder muscles.  Surgery, such as: ? A device may be implanted to help manage the nerve signals that control urination. ? An electrode may be implanted to stimulate electrical signals in the bladder. ? A procedure may be done to change the shape of the bladder. This is  done only in very severe cases. Follow these instructions at home: Eating and drinking  Make diet or lifestyle changes recommended by your health care provider. These may include: ? Drinking fluids throughout the day and not only with  meals. ? Cutting down on caffeine or alcohol. ? Eating a healthy and balanced diet to prevent constipation. This may include:  Choosing foods that are high in fiber, such as beans, whole grains, and fresh fruits and vegetables.  Limiting foods that are high in fat and processed sugars, such as fried and sweet foods.   Lifestyle  Lose weight if needed.  Do not use any products that contain nicotine or tobacco. These include cigarettes, chewing tobacco, and vaping devices, such as e-cigarettes. If you need help quitting, ask your health care provider.   General instructions  Take over-the-counter and prescription medicines only as told by your health care provider.  If you were prescribed an antibiotic medicine, take it as told by your health care provider. Do not stop taking the antibiotic even if you start to feel better.  Use any implants or pessary as told by your health care provider.  If needed, wear pads to absorb urine leakage.  Keep a log to track how much and when you drink, and when you need to urinate. This will help your health care provider monitor your condition.  Keep all follow-up visits. This is important. Contact a health care provider if:  You have a fever or chills.  Your symptoms do not get better with treatment.  Your pain and discomfort get worse.  You have more frequent urges to urinate. Get help right away if:  You are not able to control your bladder. Summary  Overactive bladder refers to a condition in which a person has a sudden and frequent need to urinate.  Several conditions may lead to an overactive bladder.  Treatment for overactive bladder depends on the cause and severity of your condition.  Making lifestyle changes, doing Kegel exercises, keeping a log, and taking medicines can help with this condition. This information is not intended to replace advice given to you by your health care provider. Make sure you discuss any questions you  have with your health care provider. Document Revised: 07/26/2020 Document Reviewed: 07/26/2020 Elsevier Patient Education  2021 [employer].  Mirabegron Extended-release Oral Tablets What is this medicine? MIRABEGRON (MIR a BEG ron) is used to treat overactive bladder. This medicine reduces the amount of bathroom visits. It may also help to control wetting accidents. This medicine may be used for other purposes; ask your health care provider or pharmacist if you have questions. COMMON BRAND NAME(S): Myrbetriq What should I tell my health care provider before I take this medicine? They need to know if you have any of these conditions:  high blood pressure  kidney disease  liver disease  problems urinating  prostate disease  an unusual or allergic reaction to mirabegron, other medicines, foods, dyes, or preservatives  pregnant or trying to get pregnant  breast-feeding How should I use this medicine? Take this medicine by mouth with water. Take it as directed on the prescription label at the same time every day. Do not cut, crush or chew this medicine. Swallow the tablets whole. Adults can take it with or without food. Children should take it with food. Keep taking it unless your health care provider tells you to stop. Talk to your health care provider about the use of this medicine in children. While it  may be prescribed for children as young as 3 years for selected conditions, precautions do apply. Overdosage: If you think you have taken too much of this medicine contact a poison control center or emergency room at once. NOTE: This medicine is only for you. Do not share this medicine with others. What if I miss a dose? If you miss a dose, take it as soon as you can unless it is more than 12 hours late. If it is more than 12 hours late, skip the missed dose. Take the next dose at the normal time. What may interact with this  medicine?  codeine  desipramine  digoxin  flecainide  MAOIs like Carbex, Eldepryl, Marplan, Nardil, and Parnate  methadone  metoprolol  pimozide  propafenone  thioridazine  warfarin This list may not describe all possible interactions. Give your health care provider a list of all the medicines, herbs, non-prescription drugs, or dietary supplements you use. Also tell them if you smoke, drink alcohol, or use illegal drugs. Some items may interact with your medicine. What should I watch for while using this medicine? Visit your health care provider for regular checks on your progress. Tell your health care provider if your symptoms do not start to get better or if they get worse. Check your blood pressure as directed. Ask your doctor or health care professional what your blood pressure should be and when you should contact him or her. What side effects may I notice from receiving this medicine? Side effects that you should report to your doctor or health care professional as soon as possible:  allergic reactions (skin rash, itching or hives; swelling of the face, lips, or tongue)  increase in blood pressure  fast, irregular heartbeat  infection (fever, chills, pain or trouble passing urine)  trouble passing urine Side effects that usually do not require medical attention (report these to your doctor or health care professional if they continue or are bothersome):  constipation  dry mouth  headache  nausea  sore throat This list may not describe all possible side effects. Call your doctor for medical advice about side effects. You may report side effects to FDA at 1-800-FDA-1088. Where should I keep my medicine? Keep out of the reach of children and pets. Store at room temperature between 20 and 25 degrees C (68 and 77 degrees F). Get rid of any unused medicine after the expiration date. To get rid of medicines that are no longer needed or have expired:  Take the  medicine to a medicine take-back program. Check with your pharmacy or law enforcement to find a location.  If you cannot return the medicine, check the label or package insert to see if the medicine should be thrown out in the garbage or flushed down the toilet. If you are not sure, ask your health care provider. If it is safe to put it in the trash, empty the medicine out of the container. Mix the medicine with cat litter, dirt, coffee grounds, or other unwanted substance. Seal the mixture in a bag or container. Put it in the trash. NOTE: This sheet is a summary. It may not cover all possible information. If you have questions about this medicine, talk to your doctor, pharmacist, or health care provider.  2021 Elsevier/Gold Standard (2020-08-06 09:56:57)

## 2021-02-28 NOTE — Progress Notes (Signed)
Provider: Marlowe Sax FNP-C  Prisca Gearing, Nelda Bucks, NP  Patient Care Team: Travez Stancil, Nelda Bucks, NP as PCP - General (Family Medicine) Tanda Rockers, MD as Consulting Physician (Pulmonary Disease) Deveron Furlong, NP as Nurse Practitioner (Psychiatry)  Extended Emergency Contact Information Primary Emergency Contact: Byland,Roberta(Bobbi) Address: [address]          [place], [place] [address] [name] of [place] Phone: [phone] Mobile Phone: [phone] Relation: Daughter Secondary Emergency Contact: Dipalma,David Address: [address]           [place], [place] [address] [place] Phone: [phone] Work Phone: [phone] Relation: Son  Code Status:  DNR Goals of care: Advanced Directive information Advanced Directives 02/28/2021  Does Patient Have a Medical Advance Directive? Yes  Type of Advance Directive Out of facility DNR (pink MOST or yellow form);Healthcare Power of Attorney  Does patient want to make changes to medical advance directive? No - Patient declined  Copy of [place] in Chart? Yes - validated most recent copy scanned in chart (See row information)  Would patient like information on creating a medical advance directive? -  Pre-existing out of facility DNR order (yellow form or pink MOST form) -     Chief Complaint  Patient presents with  . Acute Visit    Urgency to use the bathroom. Uses bathroom on herself.     HPI:  Pt is a 86 y.o. female seen today for an acute visit for evaluation of urine urgency.she is here with her care giver.Has had increased urine urgency sometimes does not make it to the bathroom.whenever she tells the care giver that she needs to use the bathroom by the time she stands the urine just runs down.used to be able to make it to the bathroom but now has no control when she has urgency.she denies any fever,chills,dysuria,Frequency,abdominal or back pain.appetite is good and no  increased confusion.     Past Medical History:  Diagnosis Date  . A-fib ([facility])   . Anemia   . Anxiety   . Arthritis    hands, spine   . Bladder incontinence   . Blood transfusion   . Cancer ([facility]) 02/2019   left breast cancer  . Cervicalgia   . Constipation   . Cough    Wert-onset 08/2009, as of 2014- resolved   . Deep venous thrombosis ([facility])    post op, rec'd/needed  blood thinner   . Depression   . Depression with anxiety   . Dysrhythmia    a-fib  . Falls frequently    pt. reports that she was falling 3 times a day, has had PT at a facility for a while & now is getting home PT 2-3 times/ week   . Headache(784.0) 09/27/2011  . History of stress test    15-20 yrs.ago   . Hypotension   . Hypothyroidism   . Lumbar radiculopathy 09/26/2011  . Pneumonia    never been in hosp. for pneumonia   . Prediabetes   . Right hand fracture    Monday  . Shortness of breath   . Sleep apnea    does not use consistently   Past Surgical History:  Procedure Laterality Date  . ANTERIOR CERVICAL DECOMP/DISCECTOMY FUSION N/A 07/09/2013   Procedure: ANTERIOR CERVICAL DECOMPRESSION/DISCECTOMY FUSION 3 LEVELS;  Surgeon: Sinclair Ship, MD;  Location: [facility];  Service: Orthopedics;  Laterality: N/A;  Anterior cervical decompression fusion, cervical 3-4, cervical 4-5-, cervical 5-6 with  instrumentation, allograft.  . APPENDECTOMY    . BACK SURGERY     2000  . BREAST EXCISIONAL BIOPSY     breast bxs on left x 2, breast bx of right x 1-all benign  . EYE SURGERY     cataracts bilateral /w IOL  . HERNIA REPAIR     umbilical hernia- [id number]  . IR IMAGING GUIDED PORT INSERTION  04/28/2019  . JOINT REPLACEMENT  2006,2007   bilateral  . LAPAROSCOPIC OVARIAN CYSTECTOMY     not done by laparscopy-abdominal incision  . LEG SURGERY    . ORIF PERIPROSTHETIC FRACTURE Right 07/26/2019   Procedure: OPEN REDUCTION INTERNAL FIXATION (ORIF) RIGHT FEMUR PERIPROSTHETIC FRACTURE;  Surgeon: Rod Can, MD;   Location: [facility];  Service: Orthopedics;  Laterality: Right;  . PORTACATH PLACEMENT Right 04/01/2019   Procedure: ATTEMPTED INSERTION PORT-A-CATH WITH ULTRASOUND;  Surgeon: [name], MD;  Location: [facility];  Service: General;  Laterality: Right;  . RADIOACTIVE SEED GUIDED PARTIAL MASTECTOMY WITH AXILLARY SENTINEL LYMPH NODE BIOPSY Left 04/01/2019   Procedure: LEFT BREAST PARTIAL MASTECTOMY WITH RADIOACTIVE SEED AND LEFT SENTINEL LYMPH NODE BIOPSY;  Surgeon: [name], MD;  Location: [facility];  Service: General;  Laterality: Left;  . RE-EXCISION OF BREAST CANCER,SUPERIOR MARGINS Left 04/15/2019   Procedure: RE-EXCISION OF LEFT BREAST CANCER POSITIVE MARGINS;  Surgeon: [name], MD;  Location: [facility];  Service: General;  Laterality: Left;  . REPLACEMENT TOTAL KNEE BILATERAL    . TONSILLECTOMY      Allergies  Allergen Reactions  . Clindamycin Hcl Other (See Comments)    REACTION: swelling, pt. Reports that she had a 16 lb. weightgain in one day    Outpatient Encounter Medications as of 02/28/2021  Medication Sig  . albuterol (VENTOLIN HFA) 108 (90 Base) MCG/ACT inhaler Inhale 2 puffs into the lungs every 6 (six) hours as needed for wheezing or shortness of breath.  . Brexpiprazole 1 MG TABS Take 1 mg by mouth at bedtime.   . busPIRone (BUSPAR) 5 MG tablet Take 5-10 mg by mouth 2 (two) times daily. 10 MG IN THE MORNING, 5 MG AT DINNER  . CALCIUM CITRATE PO Take 600 mg by mouth daily.   . clonazePAM (KLONOPIN) 0.5 MG tablet Take 1 tablet by mouth as needed in middle of the nite to go back to sleep  . CRANBERRY PO Take 2 tablets by mouth daily.  . D-Mannose 500 MG CAPS Take by mouth daily.  . DULoxetine (CYMBALTA) 30 MG capsule Take 30 mg by mouth in the morning, at noon, and at bedtime.  [name] ELIQUIS 5 MG TABS tablet TAKE 1 TABLET BY MOUTH TWICE A DAY  . furosemide (LASIX) 20 MG tablet Take 1 tablet (20 mg total) by mouth  daily.  [name] KLOR-CON M20 20 MEQ tablet TAKE 1 TABLET BY MOUTH EVERY DAY  . levETIRAcetam (KEPPRA) 500 MG tablet Take 1 tablet (500 mg total) by mouth 2 (two) times daily.  [name] levothyroxine (SYNTHROID) 50 MCG tablet Take 1 tablet (50 mcg total) by mouth daily before breakfast.  . mirtazapine (REMERON) 15 MG tablet Take 7.5 mg by mouth at bedtime.  . Multiple Vitamins-Minerals (MULTIVITAMIN PO) Take 1 tablet by mouth daily.   [name] oxybutynin (DITROPAN-XL) 10 MG 24 hr tablet Take 10 mg by mouth daily.  [name] oxyCODONE-acetaminophen (PERCOCET) 5-325 MG tablet Take 1 tablet by mouth every 4 (four) hours as needed for severe pain.  [name] senna-docusate (SENOKOT-S) 8.6-50 MG  tablet Take 1 tablet by mouth 2 (two) times daily as needed for mild constipation.  . traZODone (DESYREL) 100 MG tablet Take 100 mg by mouth at bedtime.  . vitamin B-12 (CYANOCOBALAMIN) 500 MCG tablet Take 500 mcg by mouth daily.  . [DISCONTINUED] DULoxetine (CYMBALTA) 60 MG capsule Take 1 capsule (60 mg total) by mouth daily.   No facility-administered encounter medications on file as of 02/28/2021.    Review of Systems  Constitutional: Negative for appetite change, chills, fatigue and fever.  Respiratory: Negative for cough, chest tightness, shortness of breath and wheezing.   Cardiovascular: Negative for chest pain, palpitations and leg swelling.  Gastrointestinal: Negative for abdominal distention, abdominal pain, constipation, diarrhea, nausea and vomiting.  Endocrine: Negative for polydipsia, polyphagia and polyuria.  Genitourinary: Positive for urgency. Negative for difficulty urinating, dysuria, flank pain, frequency and hematuria.  Musculoskeletal: Positive for arthralgias and gait problem. Negative for back pain.  Skin: Negative for color change, pallor and rash.  Psychiatric/Behavioral: Negative for agitation, behavioral problems, confusion and sleep disturbance. The patient is not nervous/anxious.     Immunization History   Administered Date(s) Administered  . Fluad Quad(high Dose 65+) 07/22/2020  . Influenza Split 08/14/2013  . Influenza Whole 08/20/2009  . Influenza, High Dose Seasonal PF 09/11/2017, 07/25/2018  . Influenza,inj,Quad PF,6+ Mos 08/04/2014, 09/06/2015, 07/10/2016  . PFIZER Comirnaty(Gray Top)Covid-19 Tri-Sucrose Vaccine 02/23/2021  . PFIZER(Purple Top)SARS-COV-2 Vaccination 12/27/2019, 01/17/2020, 08/30/2020  . Pneumococcal Conjugate-13 01/14/2015  . Pneumococcal Polysaccharide-23 07/11/2013  . Td 11/20/2005, 11/27/2016   Pertinent  Health Maintenance Due  Topic Date Due  . OPHTHALMOLOGY EXAM  02/16/2021  . HEMOGLOBIN A1C  05/25/2021  . INFLUENZA VACCINE  06/20/2021  . FOOT EXAM  07/22/2021  . URINE MICROALBUMIN  11/26/2021  . DEXA SCAN  Completed  . PNA vac Low Risk Adult  Completed   Fall Risk  02/28/2021 12/23/2020 11/25/2020 07/22/2020 04/21/2020  Falls in the past year? 0 1 1 1 1   Number falls in past yr: 0 1 0 1 1  Comment - - - Care giver stated 6 falls -  Injury with Fall? 0 0 0 0 0  Comment - - - - -  Risk for fall due to : - - - - -  Follow up - - - - -   Functional Status Survey:    Vitals:   02/28/21 0927  BP: 90/60  Pulse: 60  Resp: 16  Temp: (!) 96.9 F (36.1 C)  SpO2: 90%  Weight: 203 lb (92.1 kg)  Height: 5\' 2"  (1.575 m)   Body mass index is 37.13 kg/m. Physical Exam Vitals reviewed.  Constitutional:      General: She is not in acute distress.    Appearance: She is obese. She is not ill-appearing.  Eyes:     General: No scleral icterus.       Right eye: No discharge.        Left eye: No discharge.     Conjunctiva/sclera: Conjunctivae normal.     Pupils: Pupils are equal, round, and reactive to light.  Cardiovascular:     Rate and Rhythm: Normal rate and regular rhythm.     Pulses: Normal pulses.     Heart sounds: Normal heart sounds. No murmur heard. No friction rub. No gallop.   Pulmonary:     Effort: Pulmonary effort is normal. No respiratory  distress.     Breath sounds: Normal breath sounds. No wheezing or rales.  Chest:     Chest  wall: No tenderness.  Abdominal:     General: Bowel sounds are normal. There is no distension.     Palpations: Abdomen is soft. There is no mass.     Tenderness: There is no abdominal tenderness. There is no right CVA tenderness, left CVA tenderness, guarding or rebound.  Musculoskeletal:        General: No swelling or tenderness.     Right lower leg: No edema.     Left lower leg: No edema.     Comments: Unsteady gait walks with a walker   Skin:    General: Skin is warm and dry.     Coloration: Skin is not pale.     Findings: No erythema or rash.  Neurological:     Mental Status: She is alert. Mental status is at baseline.     Cranial Nerves: No cranial nerve deficit.     Motor: No weakness.     Gait: Gait abnormal.  Psychiatric:        Mood and Affect: Mood normal.        Behavior: Behavior normal.        Thought Content: Thought content normal.        Judgment: Judgment normal.     Labs reviewed: Recent Labs    07/22/20 1139 11/25/20 1220 01/20/21 0000  NA 139 139 138  K 4.2 4.2 4.2  CL 102 102 101  CO2 26 28 29   GLUCOSE 94 101 98  BUN 22 28* 24  CREATININE 0.81 0.90* 0.94*  CALCIUM 9.1 9.6 8.9   Recent Labs    04/22/20 1240 11/25/20 1220 01/20/21 0000  AST 14* 13 13  ALT 7 7 6   ALKPHOS 101  --   --   BILITOT 0.3 0.3 0.3  PROT 6.9 6.3 6.3  ALBUMIN 3.2*  --   --    Recent Labs    04/22/20 1240 11/25/20 1220 01/20/21 0000  WBC 7.9 5.9 6.0  NEUTROABS 6.3 4,219 4,092  HGB 9.6* 11.2* 10.7*  HCT 31.7* 35.0 33.3*  MCV 82.1 80.8 82.0  PLT 313 289 307   Lab Results  Component Value Date   TSH 1.05 11/25/2020   Lab Results  Component Value Date   HGBA1C 5.3 11/25/2020   Lab Results  Component Value Date   CHOL 172 10/16/2018   HDL 56 10/16/2018   LDLCALC 97 10/16/2018   TRIG 98 10/16/2018   CHOLHDL 3.1 10/16/2018    Significant Diagnostic Results in  last 30 days:  No results found.  Assessment/Plan  Overactive bladder Afebrile. Worsening urgency and incontinence  Negative exam finding. Start on mirabegron as below.side effects discussed to notify provider if B/p > 140/90 additional education information on mirabegron provided on AVS  - additional education information provided for [facility] - mirabegron ER (MYRBETRIQ) 25 MG TB24 tablet; Take 1 tablet (25 mg total) by mouth daily.  Dispense: 30 tablet; Refill: 0 - Ambulatory referral to [facility] for Kigel exercises  - will obtain urine specimen to rule out urinary tract infection.unable to provide specimen during visit will take urine cup home tend care giver will return specimen to office in the morning for U/A and C/S   - encouraged to increase water intake to 6-8 glassed daily   Family/ staff Communication: Reviewed plan of care with patient and Care giver verbalized understanding.   Labs/tests ordered: will bring urine specimen for U/A and C/S   Next Appointment: one month for follow up [facility]  Sandrea Hughs, NP

## 2021-03-01 ENCOUNTER — Other Ambulatory Visit: Payer: Self-pay

## 2021-03-01 DIAGNOSIS — R350 Frequency of micturition: Secondary | ICD-10-CM

## 2021-03-01 NOTE — Progress Notes (Unsigned)
ua

## 2021-03-02 DIAGNOSIS — F411 Generalized anxiety disorder: Secondary | ICD-10-CM | POA: Diagnosis not present

## 2021-03-02 DIAGNOSIS — F341 Dysthymic disorder: Secondary | ICD-10-CM | POA: Diagnosis not present

## 2021-03-02 DIAGNOSIS — F332 Major depressive disorder, recurrent severe without psychotic features: Secondary | ICD-10-CM | POA: Diagnosis not present

## 2021-03-03 ENCOUNTER — Other Ambulatory Visit: Payer: Self-pay | Admitting: Nurse Practitioner

## 2021-03-03 ENCOUNTER — Telehealth: Payer: Self-pay

## 2021-03-03 LAB — URINALYSIS, ROUTINE W REFLEX MICROSCOPIC
Bilirubin Urine: NEGATIVE
Glucose, UA: NEGATIVE
Hgb urine dipstick: NEGATIVE
Hyaline Cast: NONE SEEN /LPF
Ketones, ur: NEGATIVE
Nitrite: NEGATIVE
Protein, ur: NEGATIVE
RBC / HPF: NONE SEEN /HPF (ref 0–2)
Specific Gravity, Urine: 1.009 (ref 1.001–1.03)
Squamous Epithelial / HPF: NONE SEEN /HPF (ref <=5)
pH: 6.5 (ref 5.0–8.0)

## 2021-03-03 LAB — URINE CULTURE
MICRO NUMBER:: 11763834
SPECIMEN QUALITY:: ADEQUATE

## 2021-03-03 LAB — MICROSCOPIC MESSAGE

## 2021-03-03 MED ORDER — NITROFURANTOIN MONOHYD MACRO 100 MG PO CAPS: 100.0000 mg | ORAL_CAPSULE | Freq: Two times a day (BID) | ORAL | 0 refills | Status: DC

## 2021-03-03 NOTE — Telephone Encounter (Signed)
Incoming call received form patients caregiver asking if we can initiate verification for patient assistance to assist with cost of myrbetriq   Verification form accessed online, filled out and given to Ngetich, Nelda Bucks, NP to review and sign  Once paperwork is signed Ngetich, Nelda Bucks, NP to give to administrative staff to process via faxing form to [phone].

## 2021-03-07 NOTE — Telephone Encounter (Signed)
Initial verification Paper work signed and given to Systems analyst to fax.

## 2021-03-25 ENCOUNTER — Encounter: Payer: Self-pay | Admitting: Family

## 2021-03-25 ENCOUNTER — Ambulatory Visit (INDEPENDENT_AMBULATORY_CARE_PROVIDER_SITE_OTHER): Payer: Medicare Other | Admitting: Family

## 2021-03-25 ENCOUNTER — Other Ambulatory Visit: Payer: Self-pay

## 2021-03-25 DIAGNOSIS — N3281 Overactive bladder: Secondary | ICD-10-CM

## 2021-03-25 MED ORDER — MIRABEGRON ER 25 MG PO TB24: 25.0000 mg | ORAL_TABLET | Freq: Every day | ORAL | 5 refills | Status: DC

## 2021-03-25 NOTE — Patient Instructions (Signed)
Overactive Bladder, Adult  Overactive bladder is a condition in which a person has a sudden and frequent need to urinate. A person might also leak urine if he or she cannot get to the bathroom fast enough (urinary incontinence). Sometimes, symptoms can interfere with work or social activities. What are the causes? Overactive bladder is associated with poor nerve signals between your bladder and your brain. Your bladder may get the signal to empty before it is full. You may also have very sensitive muscles that make your bladder squeeze too soon. This condition may also be caused by other factors, such as:  Medical conditions: ? Urinary tract infection. ? Infection of nearby tissues. ? Prostate enlargement. ? Bladder stones, inflammation, or tumors. ? Diabetes. ? Muscle or nerve weakness, especially from these conditions:  A spinal cord injury.  Stroke.  Multiple sclerosis.  Parkinson's disease.  Other causes: ? Surgery on the uterus or urethra. ? Drinking too much caffeine or alcohol. ? Certain medicines, especially those that eliminate extra fluid in the body (diuretics). ? Constipation. What increases the risk? You may be at greater risk for overactive bladder if you:  Are an older adult.  Smoke.  Are going through menopause.  Have prostate problems.  Have a neurological disease, such as stroke, dementia, Parkinson's disease, or multiple sclerosis (MS).  Eat or drink alcohol, spicy food, caffeine, and other things that irritate the bladder.  Are overweight or obese. What are the signs or symptoms? Symptoms of this condition include a sudden, strong urge to urinate. Other symptoms include:  Leaking urine.  Urinating 8 or more times a day.  Waking up to urinate 2 or more times overnight. How is this diagnosed? This condition may be diagnosed based on:  Your symptoms and medical history.  A physical exam.  Blood or urine tests to check for possible causes,  such as infection. You may also need to see a health care provider who specializes in urinary tract problems. This is called a urologist. How is this treated? Treatment for overactive bladder depends on the cause of your condition and whether it is mild or severe. Treatment may include:  Bladder training, such as: ? Learning to control the urge to urinate by following a schedule to urinate at regular intervals. ? Doing Kegel exercises to strengthen the pelvic floor muscles that support your bladder.  Special devices, such as: ? Biofeedback. This uses sensors to help you become aware of your body's signals. ? Electrical stimulation. This uses electrodes placed inside the body (implanted) or outside the body. These electrodes send gentle pulses of electricity to strengthen the nerves or muscles that control the bladder. ? Women may use a plastic device, called a pessary, that fits into the vagina and supports the bladder.  Medicines, such as: ? Antibiotics to treat bladder infection. ? Antispasmodics to stop the bladder from releasing urine at the wrong time. ? Tricyclic antidepressants to relax bladder muscles. ? Injections of botulinum toxin type A directly into the bladder tissue to relax bladder muscles.  Surgery, such as: ? A device may be implanted to help manage the nerve signals that control urination. ? An electrode may be implanted to stimulate electrical signals in the bladder. ? A procedure may be done to change the shape of the bladder. This is done only in very severe cases. Follow these instructions at home: Eating and drinking  Make diet or lifestyle changes recommended by your health care provider. These may include: ? Drinking fluids   throughout the day and not only with meals. ? Cutting down on caffeine or alcohol. ? Eating a healthy and balanced diet to prevent constipation. This may include:  Choosing foods that are high in fiber, such as beans, whole grains, and  fresh fruits and vegetables.  Limiting foods that are high in fat and processed sugars, such as fried and sweet foods.   Lifestyle  Lose weight if needed.  Do not use any products that contain nicotine or tobacco. These include cigarettes, chewing tobacco, and vaping devices, such as e-cigarettes. If you need help quitting, ask your health care provider.   General instructions  Take over-the-counter and prescription medicines only as told by your health care provider.  If you were prescribed an antibiotic medicine, take it as told by your health care provider. Do not stop taking the antibiotic even if you start to feel better.  Use any implants or pessary as told by your health care provider.  If needed, wear pads to absorb urine leakage.  Keep a log to track how much and when you drink, and when you need to urinate. This will help your health care provider monitor your condition.  Keep all follow-up visits. This is important. Contact a health care provider if:  You have a fever or chills.  Your symptoms do not get better with treatment.  Your pain and discomfort get worse.  You have more frequent urges to urinate. Get help right away if:  You are not able to control your bladder. Summary  Overactive bladder refers to a condition in which a person has a sudden and frequent need to urinate.  Several conditions may lead to an overactive bladder.  Treatment for overactive bladder depends on the cause and severity of your condition.  Making lifestyle changes, doing Kegel exercises, keeping a log, and taking medicines can help with this condition. This information is not intended to replace advice given to you by your health care provider. Make sure you discuss any questions you have with your health care provider. Document Revised: 07/26/2020 Document Reviewed: 07/26/2020 Elsevier Patient Education  2021 [employer]  

## 2021-03-25 NOTE — Progress Notes (Signed)
Provider: Marlowe Sax FNP-C  Saisha Hogue, Nelda Bucks, NP  Patient Care Team: Tayte Childers, Nelda Bucks, NP as PCP - General (Family Medicine) Tanda Rockers, MD as Consulting Physician (Pulmonary Disease) Deveron Furlong, NP as Nurse Practitioner (Psychiatry)  Extended Emergency Contact Information Primary Emergency Contact: Swavely,Roberta(Bobbi) Address: [address]          [place], [place] [address] [name] of [place] Phone: [phone] Mobile Phone: [phone] Relation: Daughter Secondary Emergency Contact: Byus,David Address: [address]           [place],  [address] [place] Phone: [phone] Work Phone: [phone] Relation: Son  Code Status:  DNR Goals of care: Advanced Directive information Advanced Directives 03/25/2021  Does Patient Have a Medical Advance Directive? Yes  Type of Advance Directive Out of facility DNR (pink MOST or yellow form)  Does patient want to make changes to medical advance directive? No - Patient declined  Copy of [place] in Chart? -  Would patient like information on creating a medical advance directive? -  Pre-existing out of facility DNR order (yellow form or pink MOST form) -     Chief Complaint  Patient presents with  . Medical Management of Chronic Issues    1 month follow up  . Health Maintenance    Discuss the need for Eye exam.    HPI:  Pt is a 86 y.o. female seen today for an acute visit for one month follow up for over active bladder.she was here 02/28/2021 OAB symptoms started on Mybetriq 25 mg tablet daily.States has tremendously helped with her symptoms.she denies any side effects.Blood pressure remains at her baseline.    Past Medical History:  Diagnosis Date  . A-fib ([place])   . Anemia   . Anxiety   . Arthritis    hands, spine   . Bladder incontinence   . Blood transfusion   . Cancer ([place]) 02/2019   left breast cancer  . Cervicalgia   . Constipation   .  Cough    [place]ert-onset 08/2009, as of 2014- resolved   . Deep venous thrombosis ([place])    post op, rec'd/needed  blood thinner   . Depression   . Depression with anxiety   . Dysrhythmia    a-fib  . Falls frequently    pt. reports that she was falling 3 times a day, has had PT at a facility for a while & now is getting home PT 2-3 times/ week   . Headache(784.0) 09/27/2011  . History of stress test    15-20 yrs.ago   . Hypotension   . Hypothyroidism   . Lumbar radiculopathy 09/26/2011  . Pneumonia    never been in hosp. for pneumonia   . Prediabetes   . Right hand fracture    Monday  . Shortness of breath   . Sleep apnea    does not use consistently   Past Surgical History:  Procedure Laterality Date  . ANTERIOR CERVICAL DECOMP/DISCECTOMY FUSION N/A 07/09/2013   Procedure: ANTERIOR CERVICAL DECOMPRESSION/DISCECTOMY FUSION 3 LEVELS;  Surgeon: Sinclair Ship, MD;  Location: [facility];  Service: Orthopedics;  Laterality: N/A;  Anterior cervical decompression fusion, cervical 3-4, cervical 4-5-, cervical 5-6 with instrumentation, allograft.  . APPENDECTOMY    . BACK SURGERY     2000  . BREAST EXCISIONAL BIOPSY     breast bxs on left x 2, breast bx of right x 1-all benign  . EYE SURGERY  cataracts bilateral /w IOL  . HERNIA REPAIR     umbilical hernia- [id number]  . IR IMAGING GUIDED PORT INSERTION  04/28/2019  . JOINT REPLACEMENT  2006,2007   bilateral  . LAPAROSCOPIC OVARIAN CYSTECTOMY     not done by laparscopy-abdominal incision  . LEG SURGERY    . ORIF PERIPROSTHETIC FRACTURE Right 07/26/2019   Procedure: OPEN REDUCTION INTERNAL FIXATION (ORIF) RIGHT FEMUR PERIPROSTHETIC FRACTURE;  Surgeon: Rod Can, MD;  Location: [facility];  Service: Orthopedics;  Laterality: Right;  . PORTACATH PLACEMENT Right 04/01/2019   Procedure: ATTEMPTED INSERTION PORT-A-CATH WITH ULTRASOUND;  Surgeon: [name], MD;  Location: [facility];  Service: General;  Laterality: Right;  .  RADIOACTIVE SEED GUIDED PARTIAL MASTECTOMY WITH AXILLARY SENTINEL LYMPH NODE BIOPSY Left 04/01/2019   Procedure: LEFT BREAST PARTIAL MASTECTOMY WITH RADIOACTIVE SEED AND LEFT SENTINEL LYMPH NODE BIOPSY;  Surgeon: [name], MD;  Location: [facility];  Service: General;  Laterality: Left;  . RE-EXCISION OF BREAST CANCER,SUPERIOR MARGINS Left 04/15/2019   Procedure: RE-EXCISION OF LEFT BREAST CANCER POSITIVE MARGINS;  Surgeon: [name], MD;  Location: [facility];  Service: General;  Laterality: Left;  . REPLACEMENT TOTAL KNEE BILATERAL    . TONSILLECTOMY      Allergies  Allergen Reactions  . Clindamycin Hcl Other (See Comments)    REACTION: swelling, pt. Reports that she had a 16 lb. weightgain in one day    Outpatient Encounter Medications as of 03/25/2021  Medication Sig  . albuterol (VENTOLIN HFA) 108 (90 Base) MCG/ACT inhaler Inhale 2 puffs into the lungs every 6 (six) hours as needed for wheezing or shortness of breath.  . Brexpiprazole 1 MG TABS Take 1 mg by mouth at bedtime.   . busPIRone (BUSPAR) 5 MG tablet Take 5-10 mg by mouth 2 (two) times daily. 10 MG IN THE MORNING, 5 MG AT DINNER  . CALCIUM CITRATE PO Take 600 mg by mouth daily.   . clonazePAM (KLONOPIN) 0.5 MG tablet Take 1 tablet by mouth as needed in middle of the nite to go back to sleep  . CRANBERRY PO Take 2 tablets by mouth daily.  . D-Mannose 500 MG CAPS Take by mouth daily.  . DULoxetine (CYMBALTA) 30 MG capsule Take 30 mg by mouth in the morning, at noon, and at bedtime.  [name] ELIQUIS 5 MG TABS tablet TAKE 1 TABLET BY MOUTH TWICE A DAY  . furosemide (LASIX) 20 MG tablet Take 1 tablet (20 mg total) by mouth daily.  [name] KLOR-CON M20 20 MEQ tablet TAKE 1 TABLET BY MOUTH EVERY DAY  . levETIRAcetam (KEPPRA) 500 MG tablet Take 1 tablet (500 mg total) by mouth 2 (two) times daily.  [name] levothyroxine (SYNTHROID) 50 MCG tablet Take 1 tablet (50 mcg total) by mouth daily before breakfast.   . mirabegron ER (MYRBETRIQ) 25 MG TB24 tablet Take 1 tablet (25 mg total) by mouth daily.  . mirtazapine (REMERON) 15 MG tablet Take 7.5 mg by mouth at bedtime.  . Multiple Vitamins-Minerals (MULTIVITAMIN PO) Take 1 tablet by mouth daily.   . nitrofurantoin, macrocrystal-monohydrate, (MACROBID) 100 MG capsule Take 1 capsule (100 mg total) by mouth 2 (two) times daily.  [name] oxybutynin (DITROPAN-XL) 10 MG 24 hr tablet Take 10 mg by mouth daily.  [name] oxyCODONE-acetaminophen (PERCOCET) 5-325 MG tablet Take 1 tablet by mouth every 4 (four) hours as needed for severe pain.  [name] senna-docusate (SENOKOT-S) 8.6-50 MG tablet Take 1 tablet by mouth 2 (two)  times daily as needed for mild constipation.  . traZODone (DESYREL) 100 MG tablet Take 100 mg by mouth at bedtime.  . vitamin B-12 (CYANOCOBALAMIN) 500 MCG tablet Take 500 mcg by mouth daily.   No facility-administered encounter medications on file as of 03/25/2021.    Review of Systems  Constitutional: Negative for appetite change, chills, fatigue, fever and unexpected weight change.  HENT: Negative for congestion, dental problem, ear discharge, ear pain, facial swelling, hearing loss, nosebleeds, postnasal drip, rhinorrhea, sinus pressure, sinus pain, sneezing, sore throat, tinnitus and trouble swallowing.   Eyes: Negative for pain, discharge, redness, itching and visual disturbance.  Respiratory: Negative for cough, chest tightness, shortness of breath and wheezing.   Cardiovascular: Negative for chest pain, palpitations and leg swelling.  Gastrointestinal: Negative for abdominal distention, abdominal pain, blood in stool, constipation, diarrhea, nausea and vomiting.  Endocrine: Negative for cold intolerance, heat intolerance, polydipsia, polyphagia and polyuria.  Genitourinary: Negative for difficulty urinating, dysuria, flank pain, frequency and urgency.       OAB symptoms have improved on myrbetriq   Musculoskeletal: Positive for arthralgias and gait  problem. Negative for joint swelling, myalgias, neck pain and neck stiffness.  Skin: Negative for color change, pallor, rash and wound.  Neurological: Negative for dizziness, syncope, speech difficulty, weakness, light-headedness, numbness and headaches.  Hematological: Does not bruise/bleed easily.  Psychiatric/Behavioral: Negative for agitation, behavioral problems, confusion, hallucinations, self-injury, sleep disturbance and suicidal ideas. The patient is not nervous/anxious.     Immunization History  Administered Date(s) Administered  . Fluad Quad(high Dose 65+) 07/22/2020  . Influenza Split 08/14/2013  . Influenza Whole 08/20/2009  . Influenza, High Dose Seasonal PF 09/11/2017, 07/25/2018  . Influenza,inj,Quad PF,6+ Mos 08/04/2014, 09/06/2015, 07/10/2016  . PFIZER Comirnaty(Gray Top)Covid-19 Tri-Sucrose Vaccine 02/23/2021  . PFIZER(Purple Top)SARS-COV-2 Vaccination 12/27/2019, 01/17/2020, 08/30/2020  . Pneumococcal Conjugate-13 01/14/2015  . Pneumococcal Polysaccharide-23 07/11/2013  . Td 11/20/2005, 11/27/2016   Pertinent  Health Maintenance Due  Topic Date Due  . OPHTHALMOLOGY EXAM  02/16/2021  . HEMOGLOBIN A1C  05/25/2021  . INFLUENZA VACCINE  06/20/2021  . FOOT EXAM  07/22/2021  . URINE MICROALBUMIN  11/26/2021  . DEXA SCAN  Completed  . PNA vac Low Risk Adult  Completed   Fall Risk  03/25/2021 02/28/2021 12/23/2020 11/25/2020 07/22/2020  Falls in the past year? 0 0 1 1 1   Number falls in past yr: 0 0 1 0 1  Comment - - - - Care giver stated 6 falls  Injury with Fall? 0 0 0 0 0  Comment - - - - -  Risk for fall due to : - - - - -  Follow up - - - - -   Functional Status Survey:    Vitals:   03/25/21 0950  BP: 110/60  Pulse: 71  Resp: 16  Temp: (!) 97.5 F (36.4 C)  SpO2: 96%  Weight: 208 lb 12.8 oz (94.7 kg)  Height: 5\' 2"  (1.575 m)   Body mass index is 38.19 kg/m. Physical Exam Vitals reviewed.  Constitutional:      General: She is not in acute distress.     Appearance: Normal appearance. She is normal weight. She is not ill-appearing or diaphoretic.  HENT:     Head: Normocephalic.     Nose: Nose normal. No congestion or rhinorrhea.     Mouth/Throat:     Mouth: Mucous membranes are moist.     Pharynx: Oropharynx is clear. No oropharyngeal exudate or posterior oropharyngeal erythema.  Eyes:  General: No scleral icterus.       Right eye: No discharge.        Left eye: No discharge.     Extraocular Movements: Extraocular movements intact.     Conjunctiva/sclera: Conjunctivae normal.     Pupils: Pupils are equal, round, and reactive to light.  Cardiovascular:     Rate and Rhythm: Normal rate and regular rhythm.     Pulses: Normal pulses.     Heart sounds: Normal heart sounds. No murmur heard. No friction rub. No gallop.   Pulmonary:     Effort: Pulmonary effort is normal. No respiratory distress.     Breath sounds: Normal breath sounds. No wheezing, rhonchi or rales.  Chest:     Chest wall: No tenderness.  Abdominal:     General: Bowel sounds are normal. There is no distension.     Palpations: Abdomen is soft. There is no mass.     Tenderness: There is no abdominal tenderness. There is no right CVA tenderness, left CVA tenderness, guarding or rebound.  Musculoskeletal:        General: No swelling or tenderness.     Right lower leg: No edema.     Left lower leg: No edema.     Comments: Unsteady gait   Skin:    General: Skin is warm and dry.     Coloration: Skin is not pale.     Findings: No bruising, erythema, lesion or rash.  Neurological:     Mental Status: She is alert and oriented to person, place, and time.     Cranial Nerves: No cranial nerve deficit.     Sensory: No sensory deficit.     Motor: No weakness.     Coordination: Coordination normal.     Gait: Gait abnormal.  Psychiatric:        Mood and Affect: Mood normal.        Speech: Speech normal.        Behavior: Behavior normal.        Thought Content: Thought  content normal.        Judgment: Judgment normal.     Labs reviewed: Recent Labs    07/22/20 1139 11/25/20 1220 01/20/21 0000  NA 139 139 138  K 4.2 4.2 4.2  CL 102 102 101  CO2 26 28 29   GLUCOSE 94 101 98  BUN 22 28* 24  CREATININE 0.81 0.90* 0.94*  CALCIUM 9.1 9.6 8.9   Recent Labs    04/22/20 1240 11/25/20 1220 01/20/21 0000  AST 14* 13 13  ALT 7 7 6   ALKPHOS 101  --   --   BILITOT 0.3 0.3 0.3  PROT 6.9 6.3 6.3  ALBUMIN 3.2*  --   --    Recent Labs    04/22/20 1240 11/25/20 1220 01/20/21 0000  WBC 7.9 5.9 6.0  NEUTROABS 6.3 4,219 4,092  HGB 9.6* 11.2* 10.7*  HCT 31.7* 35.0 33.3*  MCV 82.1 80.8 82.0  PLT 313 289 307   Lab Results  Component Value Date   TSH 1.05 11/25/2020   Lab Results  Component Value Date   HGBA1C 5.3 11/25/2020   Lab Results  Component Value Date   CHOL 172 10/16/2018   HDL 56 10/16/2018   LDLCALC 97 10/16/2018   TRIG 98 10/16/2018   CHOLHDL 3.1 10/16/2018    Significant Diagnostic Results in last 30 days:  No results found.  Assessment/Plan   Overactive bladder Myrbetriq effective. Continue on mirabegron  One month mirabegron samples provided by CMA Jasmine  - additional education information provided on AVS  - mirabegron ER (MYRBETRIQ) 25 MG TB24 tablet; Take 1 tablet (25 mg total) by mouth daily.  Dispense: 30 tablet; Refill: 5  Family/ staff Communication: Reviewed plan of care with patient and care giver verbalized understanding.  Labs/tests ordered: None   Next Appointment: As needed if symptoms worsen or fail to improve    Sandrea Hughs, NP

## 2021-03-29 ENCOUNTER — Ambulatory Visit: Payer: Medicare Other | Admitting: Family

## 2021-03-31 ENCOUNTER — Ambulatory Visit: Payer: Medicare Other | Admitting: Family

## 2021-04-04 ENCOUNTER — Other Ambulatory Visit: Payer: Self-pay

## 2021-04-04 DIAGNOSIS — R0602 Shortness of breath: Secondary | ICD-10-CM

## 2021-04-04 DIAGNOSIS — E039 Hypothyroidism, unspecified: Secondary | ICD-10-CM

## 2021-04-04 DIAGNOSIS — Z7901 Long term (current) use of anticoagulants: Secondary | ICD-10-CM

## 2021-04-04 MED ORDER — FUROSEMIDE 20 MG PO TABS: 20.0000 mg | ORAL_TABLET | Freq: Every day | ORAL | 0 refills | Status: DC

## 2021-04-04 MED ORDER — ELIQUIS 5 MG PO TABS: 1.0000 | ORAL_TABLET | Freq: Two times a day (BID) | ORAL | 5 refills | Status: DC

## 2021-04-04 MED ORDER — LEVOTHYROXINE SODIUM 50 MCG PO TABS: 50.0000 ug | ORAL_TABLET | Freq: Every day | ORAL | 0 refills | Status: DC

## 2021-04-04 NOTE — Telephone Encounter (Signed)
Patient daughter "Tammi Klippel" called and states that patient needs refill on medications Lasix, Levothyroxine, and Eliquis; Medications sent into pharmacy for patient.

## 2021-04-12 ENCOUNTER — Other Ambulatory Visit: Payer: Self-pay | Admitting: *Deleted

## 2021-04-12 NOTE — Patient Outreach (Addendum)
[facility]) Care Management  04/12/2021  Andrea [name] [birth year] [id number]   RN Health Coach attempted follow up outreach call to patient.  Patient was unavailable. HIPPA compliance voicemail message left with return callback number.  Plan: RN will call patient again within 10 days.  [place] Management [phone]  [phone] update Caregiver returned call and stated that she would have the daughter to return call tomorrow. Daughter is a Pharmacist, hospital.  [facility] Care Management [phone]

## 2021-04-19 ENCOUNTER — Other Ambulatory Visit: Payer: Self-pay | Admitting: Family Medicine

## 2021-04-19 DIAGNOSIS — M79661 Pain in right lower leg: Secondary | ICD-10-CM | POA: Diagnosis not present

## 2021-04-19 DIAGNOSIS — M25561 Pain in right knee: Secondary | ICD-10-CM | POA: Diagnosis not present

## 2021-04-20 ENCOUNTER — Other Ambulatory Visit: Payer: Self-pay | Admitting: *Deleted

## 2021-04-20 DIAGNOSIS — Z79899 Other long term (current) drug therapy: Secondary | ICD-10-CM

## 2021-04-20 MED ORDER — POTASSIUM CHLORIDE CRYS ER 20 MEQ PO TBCR: 20.0000 meq | EXTENDED_RELEASE_TABLET | Freq: Every day | ORAL | 0 refills | Status: DC

## 2021-04-20 NOTE — Telephone Encounter (Signed)
[facility] 

## 2021-04-22 DIAGNOSIS — I739 Peripheral vascular disease, unspecified: Secondary | ICD-10-CM | POA: Diagnosis not present

## 2021-04-22 DIAGNOSIS — M25561 Pain in right knee: Secondary | ICD-10-CM | POA: Diagnosis not present

## 2021-04-22 DIAGNOSIS — L603 Nail dystrophy: Secondary | ICD-10-CM | POA: Diagnosis not present

## 2021-04-27 DIAGNOSIS — M7051 Other bursitis of knee, right knee: Secondary | ICD-10-CM | POA: Diagnosis not present

## 2021-04-27 DIAGNOSIS — R531 Weakness: Secondary | ICD-10-CM | POA: Diagnosis not present

## 2021-04-27 DIAGNOSIS — R262 Difficulty in walking, not elsewhere classified: Secondary | ICD-10-CM | POA: Diagnosis not present

## 2021-04-29 DIAGNOSIS — M7051 Other bursitis of knee, right knee: Secondary | ICD-10-CM | POA: Diagnosis not present

## 2021-04-29 DIAGNOSIS — R262 Difficulty in walking, not elsewhere classified: Secondary | ICD-10-CM | POA: Diagnosis not present

## 2021-04-29 DIAGNOSIS — R531 Weakness: Secondary | ICD-10-CM | POA: Diagnosis not present

## 2021-05-02 ENCOUNTER — Other Ambulatory Visit: Payer: Self-pay | Admitting: *Deleted

## 2021-05-02 DIAGNOSIS — Z8781 Personal history of (healed) traumatic fracture: Secondary | ICD-10-CM

## 2021-05-02 MED ORDER — OXYCODONE-ACETAMINOPHEN 5-325 MG PO TABS
1.0000 | ORAL_TABLET | ORAL | 0 refills | Status: DC | PRN
Start: 2021-05-02 — End: 2021-08-03

## 2021-05-02 NOTE — Telephone Encounter (Signed)
Patient's daughter requested refill.  No Contract on File, added note to upcoming appointment Epic LR: 12/28/2020 Pended Rx and sent to [facility] for approval.

## 2021-05-03 DIAGNOSIS — R531 Weakness: Secondary | ICD-10-CM | POA: Diagnosis not present

## 2021-05-03 DIAGNOSIS — R262 Difficulty in walking, not elsewhere classified: Secondary | ICD-10-CM | POA: Diagnosis not present

## 2021-05-03 DIAGNOSIS — M7051 Other bursitis of knee, right knee: Secondary | ICD-10-CM | POA: Diagnosis not present

## 2021-05-05 DIAGNOSIS — M7051 Other bursitis of knee, right knee: Secondary | ICD-10-CM | POA: Diagnosis not present

## 2021-05-05 DIAGNOSIS — R531 Weakness: Secondary | ICD-10-CM | POA: Diagnosis not present

## 2021-05-05 DIAGNOSIS — R262 Difficulty in walking, not elsewhere classified: Secondary | ICD-10-CM | POA: Diagnosis not present

## 2021-05-09 ENCOUNTER — Ambulatory Visit
Admission: RE | Admit: 2021-05-09 | Discharge: 2021-05-09 | Disposition: A | Discharge status: Home / Self Care | Payer: Medicare Other | Source: Ambulatory Visit | Attending: Family Medicine | Admitting: Family Medicine

## 2021-05-09 ENCOUNTER — Other Ambulatory Visit: Payer: Self-pay | Admitting: *Deleted

## 2021-05-09 DIAGNOSIS — M7051 Other bursitis of knee, right knee: Secondary | ICD-10-CM | POA: Diagnosis not present

## 2021-05-09 DIAGNOSIS — M25561 Pain in right knee: Secondary | ICD-10-CM | POA: Diagnosis not present

## 2021-05-09 DIAGNOSIS — R262 Difficulty in walking, not elsewhere classified: Secondary | ICD-10-CM | POA: Diagnosis not present

## 2021-05-09 DIAGNOSIS — R531 Weakness: Secondary | ICD-10-CM | POA: Diagnosis not present

## 2021-05-09 DIAGNOSIS — M25461 Effusion, right knee: Secondary | ICD-10-CM | POA: Diagnosis not present

## 2021-05-09 NOTE — Patient Outreach (Signed)
[facility]) Care Management  05/09/2021  Andrea [name] 1935-08-24 [id number]   RN Health Coach attempted follow up outreach call to patient.  Patient was unavailable. HIPPA compliance voicemail message left with return callback number.  Plan: RN will call patient again within 30 days.  [facility] Management [phone]

## 2021-05-10 ENCOUNTER — Telehealth: Payer: Self-pay

## 2021-05-10 ENCOUNTER — Ambulatory Visit (INDEPENDENT_AMBULATORY_CARE_PROVIDER_SITE_OTHER): Payer: Medicare Other | Admitting: Family

## 2021-05-10 ENCOUNTER — Other Ambulatory Visit: Payer: Self-pay

## 2021-05-10 ENCOUNTER — Ambulatory Visit: Payer: Medicare Other | Admitting: Family

## 2021-05-10 ENCOUNTER — Encounter: Payer: Self-pay | Admitting: Family

## 2021-05-10 VITALS — BP 110/60 | HR 68 | Temp 96.8°F | Ht 62.0 in | Wt 202.0 lb

## 2021-05-10 DIAGNOSIS — Z471 Aftercare following joint replacement surgery: Secondary | ICD-10-CM | POA: Diagnosis not present

## 2021-05-10 DIAGNOSIS — M25561 Pain in right knee: Secondary | ICD-10-CM | POA: Diagnosis not present

## 2021-05-10 DIAGNOSIS — G8929 Other chronic pain: Secondary | ICD-10-CM

## 2021-05-10 DIAGNOSIS — Z96651 Presence of right artificial knee joint: Secondary | ICD-10-CM | POA: Diagnosis not present

## 2021-05-10 DIAGNOSIS — F341 Dysthymic disorder: Secondary | ICD-10-CM

## 2021-05-10 DIAGNOSIS — N390 Urinary tract infection, site not specified: Secondary | ICD-10-CM

## 2021-05-10 DIAGNOSIS — R197 Diarrhea, unspecified: Secondary | ICD-10-CM

## 2021-05-10 NOTE — Patient Instructions (Signed)
Please collect a clean catch urine specimen and return to the office to check for infection.   Urinary Tract Infection, Adult  A urinary tract infection (UTI) is an infection of any part of the urinary tract. The urinary tract includes the kidneys, ureters, bladder, and urethra.These organs make, store, and get rid of urine in the body. An upper UTI affects the ureters and kidneys. A lower UTI affects the bladderand urethra. What are the causes? Most urinary tract infections are caused by bacteria in your genital area around your urethra, where urine leaves your body. These bacteria grow andcause inflammation of your urinary tract. What increases the risk? You are more likely to develop this condition if: You have a urinary catheter that stays in place. You are not able to control when you urinate or have a bowel movement (incontinence). You are female and you: Use a spermicide or diaphragm for birth control. Have low estrogen levels. Are pregnant. You have certain genes that increase your risk. You are sexually active. You take antibiotic medicines. You have a condition that causes your flow of urine to slow down, such as: An enlarged prostate, if you are female. Blockage in your urethra. A kidney stone. A nerve condition that affects your bladder control (neurogenic bladder). Not getting enough to drink, or not urinating often. You have certain medical conditions, such as: Diabetes. A weak disease-fighting system (immunesystem). Sickle cell disease. Gout. Spinal cord injury. What are the signs or symptoms? Symptoms of this condition include: Needing to urinate right away (urgency). Frequent urination. This may include small amounts of urine each time you urinate. Pain or burning with urination. Blood in the urine. Urine that smells bad or unusual. Trouble urinating. Cloudy urine. Vaginal discharge, if you are female. Pain in the abdomen or the lower back. You may also  have: Vomiting or a decreased appetite. Confusion. Irritability or tiredness. A fever or chills. Diarrhea. The first symptom in older adults may be confusion. In some cases, they may nothave any symptoms until the infection has worsened. How is this diagnosed? This condition is diagnosed based on your medical history and a physical exam. You may also have other tests, including: Urine tests. Blood tests. Tests for STIs (sexually transmitted infections). If you have had more than one UTI, a cystoscopy or imaging studies may be doneto determine the cause of the infections. How is this treated? Treatment for this condition includes: Antibiotic medicine. Over-the-counter medicines to treat discomfort. Drinking enough water to stay hydrated. If you have frequent infections or have other conditions such as a kidney stone, you may need to see a health care provider who specializes in the urinary tract (urologist). In rare cases, urinary tract infections can cause sepsis. Sepsis is a life-threatening condition that occurs when the body responds to an infection. Sepsis is treated in the hospital with IV antibiotics, fluids, and othermedicines. Follow these instructions at home:  Medicines Take over-the-counter and prescription medicines only as told by your health care provider. If you were prescribed an antibiotic medicine, take it as told by your health care provider. Do not stop using the antibiotic even if you start to feel better. General instructions Make sure you: Empty your bladder often and completely. Do not hold urine for long periods of time. Empty your bladder after sex. Wipe from front to back after urinating or having a bowel movement if you are female. Use each tissue only one time when you wipe. Drink enough fluid to keep your urine  pale yellow. Keep all follow-up visits. This is important. Contact a health care provider if: Your symptoms do not get better after 1-2  days. Your symptoms go away and then return. Get help right away if: You have severe pain in your back or your lower abdomen. You have a fever or chills. You have nausea or vomiting. Summary A urinary tract infection (UTI) is an infection of any part of the urinary tract, which includes the kidneys, ureters, bladder, and urethra. Most urinary tract infections are caused by bacteria in your genital area. Treatment for this condition often includes antibiotic medicines. If you were prescribed an antibiotic medicine, take it as told by your health care provider. Do not stop using the antibiotic even if you start to feel better. Keep all follow-up visits. This is important. This information is not intended to replace advice given to you by your health care provider. Make sure you discuss any questions you have with your healthcare provider. Document Revised: 06/18/2020 Document Reviewed: 06/18/2020 Elsevier Patient Education  [employer].

## 2021-05-10 NOTE — Telephone Encounter (Signed)
Patient was in office today for a possible UTI.   Patient was unable to collect urine specimen.   Patients caregiver Olegario Shearer was given a urine hat, labeled cup, stationary wipe, and bag to collect urine specimen at home (ok per Marlowe Sax, NP) and to return to [facility].  The urine culture  and POC Urinalysis dip orders were placed in the visit encounter from today.  1.) Culture order was signed by Webb Silversmith and given to [place]. I had a verbal conversation with Hassan Rowan explaining patient and/or caregiver will return with sample, preferably today.   2.) A medical assistant (Din[facility] if available) will need to manually dip urine and place results in today's visit encounter under "Enter results", again order was already placed.

## 2021-05-10 NOTE — Progress Notes (Signed)
Provider: Marlowe Sax FNP-C  Javis Abboud, Nelda Bucks, NP  Patient Care Team: Vanice Rappa, Nelda Bucks, NP as PCP - General (Family Medicine) Tanda Rockers, MD as Consulting Physician (Pulmonary Disease) Deveron Furlong, NP as Nurse Practitioner (Psychiatry) Pleasant, Eppie Gibson, RN as [facility]  Extended Emergency Contact Information Primary Emergency Contact: Carnevale,Roberta(Bobbi) Address: [address]          [place], [place] [address] [name] of [place] Phone: [phone] Mobile Phone: [phone] Relation: Daughter Secondary Emergency Contact: Maurin,David Address: [address]           [name], [place] [address] [place] Phone: [phone] Work Phone: [phone] Relation: Son  Code Status:  DNR Goals of care: Advanced Directive information Advanced Directives 03/25/2021  Does Patient Have a Medical Advance Directive? Yes  Type of Advance Directive Out of facility DNR (pink MOST or yellow form)  Does patient want to make changes to medical advance directive? No - Patient declined  Copy of [place] in Chart? -  Would patient like information on creating a medical advance directive? -  Pre-existing out of facility DNR order (yellow form or pink MOST form) -     Chief Complaint  Patient presents with   Acute Visit    Possible UTI, patients caregiver and daughter (not present) states patient appears to be more confused than usual. Patient also with diarrhea. Update treatment agreement for oxycodone and clonazepam. Here with caregiver Vicky who states patient may have to take supplies home to collect urine sample and bring back,    HPI:  Pt is a 86 y.o. female seen today for an acute visit for evaluation of increased confusion per patient's daughter report.Patient has been more confused than LeftTube.co.za of lower abdominal pain and burning with urination.denies any fever or chills.  Also  reports diarrhea over the weekend.Had x 2 episode yesterday.Had pepto bismol last night. Appetite is usually good.but did not eat much yesterday.No blood in the stool.  Narcotic use contract also reviewed and signed by patient this visit. Continues to require clonazepam for anxiety. Also requires oxycodone -Apap 5/325 mg tablet one every 4 hrs as needed for chronic right knee pain. No side effects reported.     Past Medical History:  Diagnosis Date   A-fib ([facility])    Anemia    Anxiety    Arthritis    hands, spine    Bladder incontinence    Blood transfusion    Cancer ([facility]) 02/2019   left breast cancer   Cervicalgia    Constipation    Cough    [place]-onset 08/2009, as of 2014- resolved    Deep venous thrombosis ([facility])    post op, rec'd/needed  blood thinner    Depression    Depression with anxiety    Dysrhythmia    a-fib   Falls frequently    pt. reports that she was falling 3 times a day, has had PT at a facility for a while & now is getting home PT 2-3 times/ week    Headache(784.0) 09/27/2011   History of stress test    15-20 yrs.ago    Hypotension    Hypothyroidism    Lumbar radiculopathy 09/26/2011   Pneumonia    never been in hosp. for pneumonia    Prediabetes    Right hand fracture    Monday   Shortness of breath    Sleep apnea    does not use  consistently   Past Surgical History:  Procedure Laterality Date   ANTERIOR CERVICAL DECOMP/DISCECTOMY FUSION N/A 07/09/2013   Procedure: ANTERIOR CERVICAL DECOMPRESSION/DISCECTOMY FUSION 3 LEVELS;  Surgeon: Sinclair Ship, MD;  Location: [facility];  Service: Orthopedics;  Laterality: N/A;  Anterior cervical decompression fusion, cervical 3-4, cervical 4-5-, cervical 5-6 with instrumentation, allograft.   APPENDECTOMY     BACK SURGERY     2000   BREAST EXCISIONAL BIOPSY     breast bxs on left x 2, breast bx of right x 1-all benign   EYE SURGERY     cataracts bilateral /w IOL   HERNIA REPAIR     umbilical hernia- 3428    IR IMAGING GUIDED PORT INSERTION  04/28/2019   JOINT REPLACEMENT  2006,2007   bilateral   LAPAROSCOPIC OVARIAN CYSTECTOMY     not done by laparscopy-abdominal incision   LEG SURGERY     ORIF PERIPROSTHETIC FRACTURE Right 07/26/2019   Procedure: OPEN REDUCTION INTERNAL FIXATION (ORIF) RIGHT FEMUR PERIPROSTHETIC FRACTURE;  Surgeon: Rod Can, MD;  Location: [facility];  Service: Orthopedics;  Laterality: Right;   PORTACATH PLACEMENT Right 04/01/2019   Procedure: ATTEMPTED INSERTION PORT-A-CATH WITH ULTRASOUND;  Surgeon: [name], MD;  Location: [facility];  Service: General;  Laterality: Right;   RADIOACTIVE SEED GUIDED PARTIAL MASTECTOMY WITH AXILLARY SENTINEL LYMPH NODE BIOPSY Left 04/01/2019   Procedure: LEFT BREAST PARTIAL MASTECTOMY WITH RADIOACTIVE SEED AND LEFT SENTINEL LYMPH NODE BIOPSY;  Surgeon: [name], MD;  Location: [facility];  Service: General;  Laterality: Left;   RE-EXCISION OF BREAST CANCER,SUPERIOR MARGINS Left 04/15/2019   Procedure: RE-EXCISION OF LEFT BREAST CANCER POSITIVE MARGINS;  Surgeon: [name], MD;  Location: [facility];  Service: General;  Laterality: Left;   REPLACEMENT TOTAL KNEE BILATERAL     TONSILLECTOMY      Allergies  Allergen Reactions   Clindamycin Hcl Other (See Comments)    REACTION: swelling, pt. Reports that she had a 16 lb. weightgain in one day    Outpatient Encounter Medications as of 05/10/2021  Medication Sig   albuterol (VENTOLIN HFA) 108 (90 Base) MCG/ACT inhaler Inhale 2 puffs into the lungs every 6 (six) hours as needed for wheezing or shortness of breath.   apixaban (ELIQUIS) 5 MG TABS tablet Take 1 tablet (5 mg total) by mouth 2 (two) times daily.   Brexpiprazole 1 MG TABS Take 1 mg by mouth at bedtime.    busPIRone (BUSPAR) 5 MG tablet Take 5-10 mg by mouth 2 (two) times daily. 10 MG IN THE MORNING, 5 MG AT DINNER   CALCIUM CITRATE PO Take 600 mg by mouth daily.     clonazePAM (KLONOPIN) 0.5 MG tablet Take 1 tablet by mouth as needed in middle of the nite to go back to sleep   CRANBERRY PO Take 2 tablets by mouth daily.   D-Mannose 500 MG CAPS Take by mouth daily.   DULoxetine (CYMBALTA) 30 MG capsule Take 30 mg by mouth in the morning, at noon, and at bedtime.   furosemide (LASIX) 20 MG tablet Take 1 tablet (20 mg total) by mouth daily.   levETIRAcetam (KEPPRA) 500 MG tablet Take 1 tablet (500 mg total) by mouth 2 (two) times daily.   levothyroxine (SYNTHROID) 50 MCG tablet Take 1 tablet (50 mcg total) by mouth daily before breakfast.   mirabegron ER (MYRBETRIQ) 25 MG TB24 tablet Take 1 tablet (25 mg total) by mouth daily.   mirtazapine (REMERON) 15  MG tablet Take 7.5 mg by mouth at bedtime.   Multiple Vitamins-Minerals (MULTIVITAMIN PO) Take 1 tablet by mouth daily.    nitrofurantoin, macrocrystal-monohydrate, (MACROBID) 100 MG capsule Take 1 capsule (100 mg total) by mouth 2 (two) times daily.   oxybutynin (DITROPAN-XL) 10 MG 24 hr tablet Take 10 mg by mouth daily.   oxyCODONE-acetaminophen (PERCOCET) 5-325 MG tablet Take 1 tablet by mouth every 4 (four) hours as needed for severe pain.   potassium chloride SA (KLOR-CON M20) 20 MEQ tablet Take 1 tablet (20 mEq total) by mouth daily.   senna-docusate (SENOKOT-S) 8.6-50 MG tablet Take 1 tablet by mouth 2 (two) times daily as needed for mild constipation.   traZODone (DESYREL) 100 MG tablet Take 100 mg by mouth at bedtime.   vitamin B-12 (CYANOCOBALAMIN) 500 MCG tablet Take 500 mcg by mouth daily.   No facility-administered encounter medications on file as of 05/10/2021.    Review of Systems  Constitutional:  Negative for appetite change, chills, fatigue and fever.       4 lbs weight loss   Respiratory:  Negative for cough, chest tightness, shortness of breath and wheezing.   Cardiovascular:  Negative for chest pain, palpitations and leg swelling.  Gastrointestinal:  Positive for abdominal pain.  Negative for abdominal distention, blood in stool, constipation, nausea and vomiting.       Had diarrhea over the weekend and yesterday but none today   Genitourinary:  Positive for dysuria. Negative for difficulty urinating, flank pain, frequency and urgency.  Musculoskeletal:  Positive for gait problem. Negative for arthralgias, back pain, joint swelling and myalgias.  Skin:  Negative for color change, pallor and rash.  Psychiatric/Behavioral:  Negative for agitation, behavioral problems, confusion, hallucinations, self-injury, sleep disturbance and suicidal ideas. The patient is not nervous/anxious.    Immunization History  Administered Date(s) Administered   Fluad Quad(high Dose 65+) 07/22/2020   Influenza Split 08/14/2013   Influenza Whole 08/20/2009   Influenza, High Dose Seasonal PF 09/11/2017, 07/25/2018   Influenza,inj,Quad PF,6+ Mos 08/04/2014, 09/06/2015, 07/10/2016   PFIZER Comirnaty(Gray Top)Covid-19 Tri-Sucrose Vaccine 02/23/2021   PFIZER(Purple Top)SARS-COV-2 Vaccination 12/27/2019, 01/17/2020, 08/30/2020   Pneumococcal Conjugate-13 01/14/2015   Pneumococcal Polysaccharide-23 07/11/2013   Td 11/20/2005, 11/27/2016   Pertinent  Health Maintenance Due  Topic Date Due   OPHTHALMOLOGY EXAM  02/16/2021   HEMOGLOBIN A1C  05/25/2021   INFLUENZA VACCINE  06/20/2021   FOOT EXAM  07/22/2021   URINE MICROALBUMIN  11/26/2021   DEXA SCAN  Completed   PNA vac Low Risk Adult  Completed   Fall Risk  03/25/2021 02/28/2021 12/23/2020 11/25/2020 07/22/2020  Falls in the past year? 0 0 1 1 1   Number falls in past yr: 0 0 1 0 1  Comment - - - - Care giver stated 6 falls  Injury with Fall? 0 0 0 0 0  Comment - - - - -  Risk for fall due to : - - - - -  Follow up - - - - -   Functional Status Survey:    Vitals:   05/10/21 0844  BP: 110/60  Pulse: 68  Temp: (!) 96.8 F (36 C)  TempSrc: Temporal  SpO2: 99%  Weight: 202 lb (91.6 kg)  Height: 5\' 2"  (1.575 m)   Body mass index is 36.95  kg/m. Physical Exam Vitals reviewed.  Constitutional:      General: She is not in acute distress.    Appearance: Normal appearance. She is obese. She is not  ill-appearing or diaphoretic.  HENT:     Head: Normocephalic.  Cardiovascular:     Rate and Rhythm: Normal rate and regular rhythm.     Pulses: Normal pulses.     Heart sounds: Normal heart sounds. No murmur heard.   No friction rub. No gallop.  Pulmonary:     Effort: Pulmonary effort is normal. No respiratory distress.     Breath sounds: Normal breath sounds. No wheezing, rhonchi or rales.  Chest:     Chest wall: No tenderness.  Abdominal:     General: Bowel sounds are normal. There is no distension.     Palpations: Abdomen is soft. There is no mass.     Tenderness: There is abdominal tenderness in the suprapubic area. There is no right CVA tenderness, left CVA tenderness, guarding or rebound.  Skin:    General: Skin is warm and dry.     Coloration: Skin is not pale.     Findings: No bruising, erythema, lesion or rash.  Neurological:     Mental Status: She is alert and oriented to person, place, and time.     Motor: No weakness.     Gait: Gait normal.  Psychiatric:        Mood and Affect: Mood normal.        Speech: Speech normal.        Behavior: Behavior normal.        Thought Content: Thought content normal.        Judgment: Judgment normal.    Labs reviewed: Recent Labs    07/22/20 1139 11/25/20 1220 01/20/21 0000  NA 139 139 138  K 4.2 4.2 4.2  CL 102 102 101  CO2 26 28 29   GLUCOSE 94 101 98  BUN 22 28* 24  CREATININE 0.81 0.90* 0.94*  CALCIUM 9.1 9.6 8.9   Recent Labs    11/25/20 1220 01/20/21 0000  AST 13 13  ALT 7 6  BILITOT 0.3 0.3  PROT 6.3 6.3   Recent Labs    11/25/20 1220 01/20/21 0000  WBC 5.9 6.0  NEUTROABS 4,219 4,092  HGB 11.2* 10.7*  HCT 35.0 33.3*  MCV 80.8 82.0  PLT 289 307   Lab Results  Component Value Date   TSH 1.05 11/25/2020   Lab Results  Component Value  Date   HGBA1C 5.3 11/25/2020   Lab Results  Component Value Date   CHOL 172 10/16/2018   HDL 56 10/16/2018   LDLCALC 97 10/16/2018   TRIG 98 10/16/2018   CHOLHDL 3.1 10/16/2018    Significant Diagnostic Results in last 30 days:  CT KNEE RIGHT WO CONTRAST  Result Date: 05/10/2021 CLINICAL DATA:  Anterior right knee pain for 6 months EXAM: CT OF THE RIGHT KNEE WITHOUT CONTRAST TECHNIQUE: Multidetector CT imaging of the right knee was performed according to the standard protocol. Multiplanar CT image reconstructions were also generated. COMPARISON:  03/01/2020 FINDINGS: Bones/Joint/Cartilage Postsurgical changes from right total knee arthroplasty. Arthroplasty components appear in their expected alignment without evidence of periprosthetic fracture or lucency. There is also lateral sideplate and screw fixation construct along the distal femur traversing a healed distal femoral metadiaphyseal fracture. Prominent metallic streak artifact from patient's hardware significantly degrades evaluation of the adjacent structures, particularly the patella Patella appears normally aligned. Small patellar enthesophytes superiorly. Chronic calcifications along the medial margin of the distal quadriceps tendon. Trace knee joint effusion without hematocrit or fat-fluid level. Ligaments Suboptimally assessed by CT. Muscles and Tendons Musculotendinous structures appear  within normal limits by CT. Extensor mechanism appears intact. Soft tissues No fluid collection or hematoma. IMPRESSION: 1. Postsurgical changes from right total knee arthroplasty without evidence of complication. Within the limitations of this exam, no findings are identified to explain patient's anterior knee pain. 2. Partially visualized healed distal femoral metadiaphyseal fracture status post ORIF. 3. Trace knee joint effusion, nonspecific. Electronically Signed   By: Davina Poke D.O.   On: 05/10/2021 08:30    Assessment/Plan  1. Symptomatic  urinary tract infection Afebrile.reports dysuria. Suprapubic tenderness noted on exam.No bladder distention noted.  - encouraged to increase water intake. - unable to provide specimen this visit will provide urine specimen cup and supplies to collect specimen at home then care giver will return to office.  - Advised to notify provider if symptoms worsen or fail to improve - POC Urinalysis Dipstick - Culture, Urine  2. Diarrhea, unspecified type Worst over the week end but seems to be improving.No abdomina tenders except suprapubic which could be related to symptoms of UTI - hold stool softeners until diarrhea resolve - continue with hydration  - continue with imodium as needed   3. Chronic pain of right knee Continue current pain regimen Narcotic use contract reviewed and signed today  4. DEPRESSION/ANXIETY Mood stable.continue on clonazepam,Duloxetine  Clonazepam uses contract updated this visit.    Family/ staff Communication: Reviewed plan of care with patient  Labs/tests ordered:  - POC Urinalysis Dipstick - Culture, Urine  Next Appointment: As needed if symptoms worsen or fail to improve    Sandrea Hughs, NP

## 2021-05-10 NOTE — Telephone Encounter (Signed)
Order for POC Urine Dip was removed from the office visit encounter as it prevented Andrea [name] from closing her note. When patient returns urine sample an orders only encounter will need to be created to result POC Urine Dip   (DX: Symptomatic urinary tract infection)

## 2021-05-11 ENCOUNTER — Other Ambulatory Visit (INDEPENDENT_AMBULATORY_CARE_PROVIDER_SITE_OTHER): Payer: Medicare Other

## 2021-05-11 DIAGNOSIS — N390 Urinary tract infection, site not specified: Secondary | ICD-10-CM | POA: Diagnosis not present

## 2021-05-11 DIAGNOSIS — R531 Weakness: Secondary | ICD-10-CM | POA: Diagnosis not present

## 2021-05-11 DIAGNOSIS — M7051 Other bursitis of knee, right knee: Secondary | ICD-10-CM | POA: Diagnosis not present

## 2021-05-11 DIAGNOSIS — R262 Difficulty in walking, not elsewhere classified: Secondary | ICD-10-CM | POA: Diagnosis not present

## 2021-05-11 LAB — POCT URINALYSIS DIPSTICK
Bilirubin, UA: NEGATIVE
Glucose, UA: NEGATIVE
Nitrite, UA: POSITIVE
Protein, UA: NEGATIVE
Spec Grav, UA: >=1.03 — AB (ref 1.010–1.025)
Urobilinogen, UA: NEGATIVE E.U./dL — AB
pH, UA: 6 (ref 5.0–8.0)

## 2021-05-12 NOTE — Telephone Encounter (Signed)
Completed yesterday

## 2021-05-13 ENCOUNTER — Other Ambulatory Visit: Payer: Self-pay

## 2021-05-13 LAB — URINE CULTURE
MICRO NUMBER:: 12041160
SPECIMEN QUALITY:: ADEQUATE

## 2021-05-13 MED ORDER — SACCHAROMYCES BOULARDII 250 MG PO CAPS
250.0000 mg | ORAL_CAPSULE | Freq: Two times a day (BID) | ORAL | 0 refills | Status: DC
Start: 2021-05-13 — End: 2021-05-24

## 2021-05-13 MED ORDER — CIPROFLOXACIN HCL 500 MG PO TABS: 500.0000 mg | ORAL_TABLET | Freq: Two times a day (BID) | ORAL | 0 refills | Status: AC

## 2021-05-16 DIAGNOSIS — R262 Difficulty in walking, not elsewhere classified: Secondary | ICD-10-CM | POA: Diagnosis not present

## 2021-05-16 DIAGNOSIS — M7051 Other bursitis of knee, right knee: Secondary | ICD-10-CM | POA: Diagnosis not present

## 2021-05-16 DIAGNOSIS — R531 Weakness: Secondary | ICD-10-CM | POA: Diagnosis not present

## 2021-05-18 DIAGNOSIS — R262 Difficulty in walking, not elsewhere classified: Secondary | ICD-10-CM | POA: Diagnosis not present

## 2021-05-18 DIAGNOSIS — R531 Weakness: Secondary | ICD-10-CM | POA: Diagnosis not present

## 2021-05-18 DIAGNOSIS — M7051 Other bursitis of knee, right knee: Secondary | ICD-10-CM | POA: Diagnosis not present

## 2021-05-19 ENCOUNTER — Other Ambulatory Visit: Payer: Self-pay | Admitting: *Deleted

## 2021-05-19 NOTE — Patient Outreach (Addendum)
[facility]) Care Management  05/19/2021  Andrea [name] [birth year] [phone]   RN Health Coach attempted follow up outreach call to patient.  Patient was unavailable. HIPPA compliance voicemail message left with return callback number.  Plan: Unsuccessful outreach letter sent to patient  RN will call patient again within 10 days.  [facility] [phone]

## 2021-05-24 ENCOUNTER — Other Ambulatory Visit: Payer: Self-pay

## 2021-05-24 ENCOUNTER — Ambulatory Visit (INDEPENDENT_AMBULATORY_CARE_PROVIDER_SITE_OTHER): Payer: Medicare Other | Admitting: Family

## 2021-05-24 ENCOUNTER — Encounter: Payer: Self-pay | Admitting: Family

## 2021-05-24 VITALS — BP 120/80 | HR 77 | Temp 97.3°F | Resp 16 | Ht 62.0 in | Wt 206.0 lb

## 2021-05-24 DIAGNOSIS — G8929 Other chronic pain: Secondary | ICD-10-CM

## 2021-05-24 DIAGNOSIS — M25561 Pain in right knee: Secondary | ICD-10-CM

## 2021-05-24 DIAGNOSIS — N3281 Overactive bladder: Secondary | ICD-10-CM

## 2021-05-24 DIAGNOSIS — E039 Hypothyroidism, unspecified: Secondary | ICD-10-CM | POA: Diagnosis not present

## 2021-05-24 DIAGNOSIS — E538 Deficiency of other specified B group vitamins: Secondary | ICD-10-CM

## 2021-05-24 DIAGNOSIS — E559 Vitamin D deficiency, unspecified: Secondary | ICD-10-CM | POA: Diagnosis not present

## 2021-05-24 DIAGNOSIS — R569 Unspecified convulsions: Secondary | ICD-10-CM | POA: Diagnosis not present

## 2021-05-24 DIAGNOSIS — M7051 Other bursitis of knee, right knee: Secondary | ICD-10-CM | POA: Diagnosis not present

## 2021-05-24 DIAGNOSIS — I480 Paroxysmal atrial fibrillation: Secondary | ICD-10-CM | POA: Diagnosis not present

## 2021-05-24 DIAGNOSIS — F341 Dysthymic disorder: Secondary | ICD-10-CM

## 2021-05-24 DIAGNOSIS — R262 Difficulty in walking, not elsewhere classified: Secondary | ICD-10-CM | POA: Diagnosis not present

## 2021-05-24 NOTE — Patient Instructions (Signed)
-   Please get your  shingrix vaccine at your pharmacy   - Also schedule appointment with your Ophthalmology for annual eye exam

## 2021-05-24 NOTE — Progress Notes (Signed)
Provider: Marlowe Sax FNP-C   Leandrew Keech, Nelda Bucks, NP  Patient Care Team: Regnald Bowens, Nelda Bucks, NP as PCP - General (Family Medicine) Tanda Rockers, MD as Consulting Physician (Pulmonary Disease) Deveron Furlong, NP as Nurse Practitioner (Psychiatry) Pleasant, Eppie Gibson, RN as [facility]  Extended Emergency Contact Information Primary Emergency Contact: Valenza,Roberta(Bobbi) Address: [address]          [place], [place] [address] [name] of [place] Phone: [phone] Mobile Phone: [phone] Relation: Daughter Secondary Emergency Contact: Haft,David Address: [address]           [place],  [address] [place] Phone: [phone] Work Phone: [phone] Relation: Son  Code Status:  DNR Goals of care: Advanced Directive information Advanced Directives 05/24/2021  Does Patient Have a Medical Advance Directive? Yes  Type of Advance Directive Out of facility DNR (pink MOST or yellow form)  Does patient want to make changes to medical advance directive? No - Patient declined  Copy of [place] in Chart? -  Would patient like information on creating a medical advance directive? -  Pre-existing out of facility DNR order (yellow form or pink MOST form) -     Chief Complaint  Patient presents with   Medical Management of Chronic Issues    4 month follow up.    Health Maintenance    Discuss the need for Eye exam.    Immunizations    Discuss the need for Shingrix vaccine.     HPI:  Pt is a 86 y.o. female seen today for 4 months follow up for medical management of chronic diseases.She is here with her care giver.states recent symptoms of urinary tract infection have resolved.She has completed 7 days course of Cipro.Mirabegron and oxybutynin helps with urine frequency symptoms.   States has trouble reading stuff that are far.she was seen by her eye doctor had prescription glasses but makes  her to see double.Care giver states daughter will schedule patient to see eye specialist.   Chronic pain on knee controlled with current pain medication.No drowsiness reported.No fall episode reported.Has care givers that assist with her ADL's. Would like vit D level checked.  Appetite has been good.Has gain weight. States seeps well at night. No seizure reported.takes Keppra twice daily as directed.   Past Medical History:  Diagnosis Date   A-fib ([facility])    Anemia    Anxiety    Arthritis    hands, spine    Bladder incontinence    Blood transfusion    Cancer ([facility]) 02/2019   left breast cancer   Cervicalgia    Constipation    Cough    [place]-onset 08/2009, as of 2014- resolved    Deep venous thrombosis ([facility])    post op, rec'd/needed  blood thinner    Depression    Depression with anxiety    Dysrhythmia    a-fib   Falls frequently    pt. reports that she was falling 3 times a day, has had PT at a facility for a while & now is getting home PT 2-3 times/ week    Headache(784.0) 09/27/2011   History of stress test    15-20 yrs.ago    Hypotension    Hypothyroidism    Lumbar radiculopathy 09/26/2011   Pneumonia    never been in hosp. for pneumonia    Prediabetes    Right hand fracture    Monday   Shortness of breath  Sleep apnea    does not use consistently   Past Surgical History:  Procedure Laterality Date   ANTERIOR CERVICAL DECOMP/DISCECTOMY FUSION N/A 07/09/2013   Procedure: ANTERIOR CERVICAL DECOMPRESSION/DISCECTOMY FUSION 3 LEVELS;  Surgeon: Sinclair Ship, MD;  Location: [facility];  Service: Orthopedics;  Laterality: N/A;  Anterior cervical decompression fusion, cervical 3-4, cervical 4-5-, cervical 5-6 with instrumentation, allograft.   APPENDECTOMY     BACK SURGERY     2000   BREAST EXCISIONAL BIOPSY     breast bxs on left x 2, breast bx of right x 1-all benign   EYE SURGERY     cataracts bilateral /w IOL   HERNIA REPAIR     umbilical hernia- 7619   IR  IMAGING GUIDED PORT INSERTION  04/28/2019   JOINT REPLACEMENT  2006,2007   bilateral   LAPAROSCOPIC OVARIAN CYSTECTOMY     not done by laparscopy-abdominal incision   LEG SURGERY     ORIF PERIPROSTHETIC FRACTURE Right 07/26/2019   Procedure: OPEN REDUCTION INTERNAL FIXATION (ORIF) RIGHT FEMUR PERIPROSTHETIC FRACTURE;  Surgeon: Rod Can, MD;  Location: [facility];  Service: Orthopedics;  Laterality: Right;   PORTACATH PLACEMENT Right 04/01/2019   Procedure: ATTEMPTED INSERTION PORT-A-CATH WITH ULTRASOUND;  Surgeon: [name], MD;  Location: [facility];  Service: General;  Laterality: Right;   RADIOACTIVE SEED GUIDED PARTIAL MASTECTOMY WITH AXILLARY SENTINEL LYMPH NODE BIOPSY Left 04/01/2019   Procedure: LEFT BREAST PARTIAL MASTECTOMY WITH RADIOACTIVE SEED AND LEFT SENTINEL LYMPH NODE BIOPSY;  Surgeon: [name], MD;  Location: [facility];  Service: General;  Laterality: Left;   RE-EXCISION OF BREAST CANCER,SUPERIOR MARGINS Left 04/15/2019   Procedure: RE-EXCISION OF LEFT BREAST CANCER POSITIVE MARGINS;  Surgeon: [name], MD;  Location: [facility];  Service: General;  Laterality: Left;   REPLACEMENT TOTAL KNEE BILATERAL     TONSILLECTOMY      Allergies  Allergen Reactions   Clindamycin Hcl Other (See Comments)    REACTION: swelling, pt. Reports that she had a 16 lb. weightgain in one day    Allergies as of 05/24/2021       Reactions   Clindamycin Hcl Other (See Comments)   REACTION: swelling, pt. Reports that she had a 16 lb. weightgain in one day        Medication List        Accurate as of May 24, 2021 11:21 AM. If you have any questions, ask your nurse or doctor.          albuterol 108 (90 Base) MCG/ACT inhaler Commonly known as: VENTOLIN HFA Inhale 2 puffs into the lungs every 6 (six) hours as needed for wheezing or shortness of breath.   brexpiprazole 1 MG Tabs tablet Commonly known as: REXULTI Take 1  mg by mouth at bedtime.   busPIRone 5 MG tablet Commonly known as: BUSPAR Take 5-10 mg by mouth 2 (two) times daily. 10 MG IN THE MORNING, 5 MG AT DINNER   CALCIUM CITRATE PO Take 600 mg by mouth daily.   clonazePAM 0.5 MG tablet Commonly known as: KLONOPIN Take 1 tablet by mouth as needed in middle of the nite to go back to sleep   CRANBERRY PO Take 2 tablets by mouth daily.   D-Mannose 500 MG Caps Take by mouth daily.   DULoxetine 30 MG capsule Commonly known as: CYMBALTA Take 30 mg by mouth in the morning, at noon, and at bedtime.   Eliquis 5 MG Tabs tablet  Generic drug: apixaban Take 1 tablet (5 mg total) by mouth 2 (two) times daily.   furosemide 20 MG tablet Commonly known as: LASIX Take 1 tablet (20 mg total) by mouth daily.   levETIRAcetam 500 MG tablet Commonly known as: Keppra Take 1 tablet (500 mg total) by mouth 2 (two) times daily.   levothyroxine 50 MCG tablet Commonly known as: SYNTHROID Take 1 tablet (50 mcg total) by mouth daily before breakfast.   mirabegron ER 25 MG Tb24 tablet Commonly known as: Myrbetriq Take 1 tablet (25 mg total) by mouth daily.   mirtazapine 15 MG tablet Commonly known as: REMERON Take 7.5 mg by mouth at bedtime.   MULTIVITAMIN PO Take 1 tablet by mouth daily.   nitrofurantoin (macrocrystal-monohydrate) 100 MG capsule Commonly known as: Macrobid Take 1 capsule (100 mg total) by mouth 2 (two) times daily.   oxybutynin 10 MG 24 hr tablet Commonly known as: DITROPAN-XL Take 10 mg by mouth daily.   oxyCODONE-acetaminophen 5-325 MG tablet Commonly known as: Percocet Take 1 tablet by mouth every 4 (four) hours as needed for severe pain.   potassium chloride SA 20 MEQ tablet Commonly known as: Klor-Con M20 Take 1 tablet (20 mEq total) by mouth daily.   saccharomyces boulardii 250 MG capsule Commonly known as: Florastor Take 1 capsule (250 mg total) by mouth 2 (two) times daily.   senna-docusate 8.6-50 MG  tablet Commonly known as: Senokot-S Take 1 tablet by mouth 2 (two) times daily as needed for mild constipation.   traZODone 100 MG tablet Commonly known as: DESYREL Take 100 mg by mouth at bedtime.   vitamin B-12 500 MCG tablet Commonly known as: CYANOCOBALAMIN Take 500 mcg by mouth daily.        Review of Systems  Constitutional:  Negative for appetite change, chills, fatigue, fever and unexpected weight change.  HENT:  Positive for dental problem and hearing loss. Negative for congestion, ear discharge, ear pain, facial swelling, nosebleeds, postnasal drip, rhinorrhea, sinus pressure, sinus pain, sneezing, sore throat, tinnitus and trouble swallowing.        HOH wears hearing aids  Has missing teeth wears dentures   Eyes:  Positive for visual disturbance. Negative for pain, discharge, redness and itching.       Unable to read stuff that are far.daughter will schedule appointment with her eye doctor  Respiratory:  Negative for cough, chest tightness, shortness of breath and wheezing.   Cardiovascular:  Negative for chest pain, palpitations and leg swelling.  Gastrointestinal:  Negative for abdominal distention, abdominal pain, blood in stool, constipation, diarrhea, nausea and vomiting.  Endocrine: Negative for cold intolerance, heat intolerance, polydipsia, polyphagia and polyuria.  Genitourinary:  Negative for difficulty urinating, dysuria, flank pain and urgency.       OAB symptoms controlled   Musculoskeletal:  Positive for arthralgias and gait problem. Negative for joint swelling, myalgias, neck pain and neck stiffness.       Chronic Knee pain   Skin:  Negative for color change, pallor, rash and wound.  Neurological:  Negative for dizziness, syncope, speech difficulty, weakness, light-headedness, numbness and headaches.  Hematological:  Does not bruise/bleed easily.  Psychiatric/Behavioral:  Negative for agitation, behavioral problems, confusion, hallucinations, self-injury  and suicidal ideas.        Depression,anxiety and insomnia medication effective    Immunization History  Administered Date(s) Administered   Fluad Quad(high Dose 65+) 07/22/2020   Influenza Split 08/14/2013   Influenza Whole 08/20/2009   Influenza, High Dose Seasonal  PF 09/11/2017, 07/25/2018   Influenza,inj,Quad PF,6+ Mos 08/04/2014, 09/06/2015, 07/10/2016   PFIZER Comirnaty(Gray Top)Covid-19 Tri-Sucrose Vaccine 02/23/2021   PFIZER(Purple Top)SARS-COV-2 Vaccination 12/27/2019, 01/17/2020, 08/30/2020   Pneumococcal Conjugate-13 01/14/2015   Pneumococcal Polysaccharide-23 07/11/2013   Td 11/20/2005, 11/27/2016   Pertinent  Health Maintenance Due  Topic Date Due   OPHTHALMOLOGY EXAM  02/16/2021   HEMOGLOBIN A1C  05/25/2021   INFLUENZA VACCINE  06/20/2021   FOOT EXAM  07/22/2021   URINE MICROALBUMIN  11/26/2021   DEXA SCAN  Completed   PNA vac Low Risk Adult  Completed   Fall Risk  05/24/2021 03/25/2021 02/28/2021 12/23/2020 11/25/2020  Falls in the past year? 0 0 0 1 1  Number falls in past yr: 0 0 0 1 0  Comment - - - - -  Injury with Fall? 0 0 0 0 0  Comment - - - - -  Risk for fall due to : No Fall Risks - - - -  Follow up Falls evaluation completed - - - -   Functional Status Survey:    Vitals:   05/24/21 1113  Height: '5\' 2"'  (1.575 m)   Body mass index is 36.95 kg/m. Physical Exam Vitals reviewed.  Constitutional:      General: She is not in acute distress.    Appearance: Normal appearance. She is normal weight. She is not ill-appearing or diaphoretic.  HENT:     Head: Normocephalic.     Right Ear: Tympanic membrane, ear canal and external ear normal. There is no impacted cerumen.     Left Ear: Tympanic membrane, ear canal and external ear normal. There is no impacted cerumen.     Ears:     Comments: Bilateral hearing aids in place     Nose: Nose normal. No congestion or rhinorrhea.     Mouth/Throat:     Mouth: Mucous membranes are moist.     Pharynx: Oropharynx is  clear. No oropharyngeal exudate or posterior oropharyngeal erythema.  Eyes:     General: No scleral icterus.       Right eye: No discharge.        Left eye: No discharge.     Extraocular Movements: Extraocular movements intact.     Conjunctiva/sclera: Conjunctivae normal.     Pupils: Pupils are equal, round, and reactive to light.  Neck:     Vascular: No carotid bruit.  Cardiovascular:     Rate and Rhythm: Normal rate and regular rhythm.     Pulses: Normal pulses.     Heart sounds: Normal heart sounds. No murmur heard.   No friction rub. No gallop.  Pulmonary:     Effort: Pulmonary effort is normal. No respiratory distress.     Breath sounds: Normal breath sounds. No wheezing, rhonchi or rales.  Chest:     Chest wall: No tenderness.  Abdominal:     General: Bowel sounds are normal. There is no distension.     Palpations: Abdomen is soft. There is no mass.     Tenderness: There is no abdominal tenderness. There is no right CVA tenderness, left CVA tenderness, guarding or rebound.  Musculoskeletal:        General: No swelling or tenderness.     Cervical back: Normal range of motion. No rigidity or tenderness.     Right lower leg: No edema.     Left lower leg: No edema.     Comments: Unsteady gait walks short distance with a walker on wheelchair during visit  Arthritic changes to knee  Lymphadenopathy:     Cervical: No cervical adenopathy.  Skin:    General: Skin is warm and dry.     Coloration: Skin is not pale.     Findings: No bruising, erythema, lesion or rash.  Neurological:     Mental Status: She is alert and oriented to person, place, and time.     Cranial Nerves: No cranial nerve deficit.     Sensory: No sensory deficit.     Motor: No weakness.     Coordination: Coordination normal.     Gait: Gait abnormal.  Psychiatric:        Mood and Affect: Mood normal.        Speech: Speech normal.        Behavior: Behavior normal.        Thought Content: Thought content  normal.        Judgment: Judgment normal.    Labs reviewed: Recent Labs    07/22/20 1139 11/25/20 1220 01/20/21 0000  NA 139 139 138  K 4.2 4.2 4.2  CL 102 102 101  CO2 '26 28 29  ' GLUCOSE 94 101 98  BUN 22 28* 24  CREATININE 0.81 0.90* 0.94*  CALCIUM 9.1 9.6 8.9   Recent Labs    11/25/20 1220 01/20/21 0000  AST 13 13  ALT 7 6  BILITOT 0.3 0.3  PROT 6.3 6.3   Recent Labs    11/25/20 1220 01/20/21 0000  WBC 5.9 6.0  NEUTROABS 4,219 4,092  HGB 11.2* 10.7*  HCT 35.0 33.3*  MCV 80.8 82.0  PLT 289 307   Lab Results  Component Value Date   TSH 1.05 11/25/2020   Lab Results  Component Value Date   HGBA1C 5.3 11/25/2020   Lab Results  Component Value Date   CHOL 172 10/16/2018   HDL 56 10/16/2018   LDLCALC 97 10/16/2018   TRIG 98 10/16/2018   CHOLHDL 3.1 10/16/2018    Significant Diagnostic Results in last 30 days:  CT KNEE RIGHT WO CONTRAST  Result Date: 05/10/2021 CLINICAL DATA:  Anterior right knee pain for 6 months EXAM: CT OF THE RIGHT KNEE WITHOUT CONTRAST TECHNIQUE: Multidetector CT imaging of the right knee was performed according to the standard protocol. Multiplanar CT image reconstructions were also generated. COMPARISON:  03/01/2020 FINDINGS: Bones/Joint/Cartilage Postsurgical changes from right total knee arthroplasty. Arthroplasty components appear in their expected alignment without evidence of periprosthetic fracture or lucency. There is also lateral sideplate and screw fixation construct along the distal femur traversing a healed distal femoral metadiaphyseal fracture. Prominent metallic streak artifact from patient's hardware significantly degrades evaluation of the adjacent structures, particularly the patella Patella appears normally aligned. Small patellar enthesophytes superiorly. Chronic calcifications along the medial margin of the distal quadriceps tendon. Trace knee joint effusion without hematocrit or fat-fluid level. Ligaments Suboptimally  assessed by CT. Muscles and Tendons Musculotendinous structures appear within normal limits by CT. Extensor mechanism appears intact. Soft tissues No fluid collection or hematoma. IMPRESSION: 1. Postsurgical changes from right total knee arthroplasty without evidence of complication. Within the limitations of this exam, no findings are identified to explain patient's anterior knee pain. 2. Partially visualized healed distal femoral metadiaphyseal fracture status post ORIF. 3. Trace knee joint effusion, nonspecific. Electronically Signed   By: Davina Poke D.O.   On: 05/10/2021 08:30    Assessment/Plan  1. Hypothyroidism, unspecified type  Lab Results  Component Value Date   TSH 1.05 11/25/2020  - continue on  levothyroxine 50 mcg daily on empty stomach. - TSH  2. Paroxysmal A-fib ([facility]) HR controlled. Continue on apixaban  - CBC with Differential/Platelet - CMP with eGFR([facility])  3. Seizures ([facility]) No seizure activity reported Continue Levetiractam  - CBC with Differential/Platelet - CMP with eGFR([facility])  4. Overactive bladder Symptoms stable on oxybutynin and mirabegron  No SE reported.  5. DEPRESSION/ANXIETY Mood stable  Continue on Duloxetine,clonazepam and Buspar  - TSH  6. Vitamin B12 deficiency  Continue on vitamin B - 12  500 mcg daily  - Vitamin B12  7. Vitamin D deficiency Continue on D supplement. - Vitamin D, 1,25-dihydroxy  8. Chronic pain of right knee Continues to require oxycodone no sedation reported. PDMP reviewed.Narcotic contract in place   Family/ staff Communication: Reviewed plan of care with patient and care giver   Labs/tests ordered:  - TSH - CBC with Differential/Platelet - CMP with eGFR([facility]) - Vitamin B12 - Vitamin D, 1,25-dihydroxy  Next Appointment : 6 months for medical management of chronic issues.Fasting Labs prior to visit.    Sandrea Hughs, NP

## 2021-05-26 ENCOUNTER — Other Ambulatory Visit: Payer: Self-pay | Admitting: *Deleted

## 2021-05-26 NOTE — Patient Outreach (Addendum)
[facility]) Care Management  05/26/2021  Andrea [name] [birth year] [phone]   RN CM attempted 3rd outreach call to patient to discuss services of [facility]. Patient was unavailable. HIPPA compliance voicemail message was left with return callback number.  Plan: RN will follow up again within 30 days RN sent unsuccessful outreach letter to patient POA(daughter)  [facility] Management [phone]

## 2021-05-27 LAB — COMPLETE METABOLIC PANEL WITH GFR
AG Ratio: 1.4 (calc) (ref 1.0–2.5)
ALT: 6 U/L (ref 6–29)
AST: 12 U/L (ref 10–35)
Albumin: 3.6 g/dL (ref 3.6–5.1)
Alkaline phosphatase (APISO): 59 U/L (ref 37–153)
BUN/Creatinine Ratio: 22 (calc) (ref 6–22)
BUN: 20 mg/dL (ref 7–25)
CO2: 26 mmol/L (ref 20–32)
Calcium: 9.5 mg/dL (ref 8.6–10.4)
Chloride: 102 mmol/L (ref 98–110)
Creat: 0.93 mg/dL — ABNORMAL HIGH (ref 0.60–0.88)
GFR, Est African American: 66 mL/min/{1.73_m2} (ref >=60)
GFR, Est Non African American: 57 mL/min/{1.73_m2} — ABNORMAL LOW (ref >=60)
Globulin: 2.6 g/dL (calc) (ref 1.9–3.7)
Glucose, Bld: 96 mg/dL (ref 65–99)
Potassium: 4.6 mmol/L (ref 3.5–5.3)
Sodium: 139 mmol/L (ref 135–146)
Total Bilirubin: 0.4 mg/dL (ref 0.2–1.2)
Total Protein: 6.2 g/dL (ref 6.1–8.1)

## 2021-05-27 LAB — CBC WITH DIFFERENTIAL/PLATELET
Absolute Monocytes: 462 cells/uL (ref 200–950)
Basophils Absolute: 59 cells/uL (ref 0–200)
Basophils Relative: 0.9 %
Eosinophils Absolute: 139 cells/uL (ref 15–500)
Eosinophils Relative: 2.1 %
HCT: 37 % (ref 35.0–45.0)
Hemoglobin: 11.4 g/dL — ABNORMAL LOW (ref 11.7–15.5)
Lymphs Abs: 1234 cells/uL (ref 850–3900)
MCH: 25.7 pg — ABNORMAL LOW (ref 27.0–33.0)
MCHC: 30.8 g/dL — ABNORMAL LOW (ref 32.0–36.0)
MCV: 83.5 fL (ref 80.0–100.0)
MPV: 9.6 fL (ref 7.5–12.5)
Monocytes Relative: 7 %
Neutro Abs: 4706 cells/uL (ref 1500–7800)
Neutrophils Relative %: 71.3 %
Platelets: 302 10*3/uL (ref 140–400)
RBC: 4.43 10*6/uL (ref 3.80–5.10)
RDW: 14.1 % (ref 11.0–15.0)
Total Lymphocyte: 18.7 %
WBC: 6.6 10*3/uL (ref 3.8–10.8)

## 2021-05-27 LAB — TSH: TSH: 1.26 mIU/L (ref 0.40–4.50)

## 2021-05-27 LAB — VITAMIN D 1,25 DIHYDROXY
Vitamin D 1, 25 (OH)2 Total: 24 pg/mL (ref 18–72)
Vitamin D2 1, 25 (OH)2: 10 pg/mL
Vitamin D3 1, 25 (OH)2: 14 pg/mL

## 2021-05-27 LAB — VITAMIN B12: Vitamin B-12: 1272 pg/mL — ABNORMAL HIGH (ref 200–1100)

## 2021-05-30 ENCOUNTER — Telehealth: Payer: Self-pay | Admitting: *Deleted

## 2021-05-30 NOTE — Telephone Encounter (Signed)
Can pick up urine specimen cup for U/A and C/S

## 2021-05-30 NOTE — Telephone Encounter (Signed)
Patient Caregiver, Mack Guise and stated that patient finished antibiotic given and she thought Monday that she was better. But towards the end of the week into the weekend patient was getting increasingly confused. Wonders if another Urine Specimen needs to be cultured.   Wants to know if she should bring her in for an appointment or come by and get a cup to send for urine culture.   Please Advise.

## 2021-05-31 ENCOUNTER — Other Ambulatory Visit (INDEPENDENT_AMBULATORY_CARE_PROVIDER_SITE_OTHER): Payer: Medicare Other

## 2021-05-31 DIAGNOSIS — R350 Frequency of micturition: Secondary | ICD-10-CM

## 2021-05-31 DIAGNOSIS — R262 Difficulty in walking, not elsewhere classified: Secondary | ICD-10-CM | POA: Diagnosis not present

## 2021-05-31 DIAGNOSIS — M7051 Other bursitis of knee, right knee: Secondary | ICD-10-CM | POA: Diagnosis not present

## 2021-05-31 DIAGNOSIS — R531 Weakness: Secondary | ICD-10-CM | POA: Diagnosis not present

## 2021-05-31 LAB — POCT URINALYSIS DIPSTICK
Bilirubin, UA: NEGATIVE
Blood, UA: NEGATIVE
Glucose, UA: NEGATIVE
Ketones, UA: POSITIVE
Nitrite, UA: NEGATIVE
Protein, UA: NEGATIVE
Spec Grav, UA: 1.025 (ref 1.010–1.025)
Urobilinogen, UA: NEGATIVE E.U./dL — AB
pH, UA: 5 (ref 5.0–8.0)

## 2021-05-31 NOTE — Telephone Encounter (Signed)
Daughter has already taken care of this per [facility]

## 2021-06-03 ENCOUNTER — Other Ambulatory Visit: Payer: Self-pay

## 2021-06-03 LAB — URINE CULTURE
MICRO NUMBER:: 12110374
SPECIMEN QUALITY:: ADEQUATE

## 2021-06-03 MED ORDER — AMOXICILLIN-POT CLAVULANATE 500-125 MG PO TABS: 1.0000 | ORAL_TABLET | Freq: Two times a day (BID) | ORAL | 0 refills | Status: DC

## 2021-06-03 MED ORDER — SACCHAROMYCES BOULARDII 250 MG PO CAPS: 250.0000 mg | ORAL_CAPSULE | Freq: Two times a day (BID) | ORAL | 0 refills | Status: DC

## 2021-06-06 ENCOUNTER — Ambulatory Visit
Admission: RE | Admit: 2021-06-06 | Discharge: 2021-06-06 | Disposition: A | Discharge status: Home / Self Care | Payer: Medicare Other | Source: Ambulatory Visit | Attending: Nurse Practitioner | Admitting: Nurse Practitioner

## 2021-06-06 ENCOUNTER — Other Ambulatory Visit: Payer: Self-pay

## 2021-06-06 DIAGNOSIS — Z780 Asymptomatic menopausal state: Secondary | ICD-10-CM | POA: Diagnosis not present

## 2021-06-06 DIAGNOSIS — M8589 Other specified disorders of bone density and structure, multiple sites: Secondary | ICD-10-CM | POA: Diagnosis not present

## 2021-06-06 DIAGNOSIS — E2839 Other primary ovarian failure: Secondary | ICD-10-CM

## 2021-06-07 DIAGNOSIS — M7051 Other bursitis of knee, right knee: Secondary | ICD-10-CM | POA: Diagnosis not present

## 2021-06-07 DIAGNOSIS — R262 Difficulty in walking, not elsewhere classified: Secondary | ICD-10-CM | POA: Diagnosis not present

## 2021-06-07 DIAGNOSIS — R531 Weakness: Secondary | ICD-10-CM | POA: Diagnosis not present

## 2021-06-09 DIAGNOSIS — R262 Difficulty in walking, not elsewhere classified: Secondary | ICD-10-CM | POA: Diagnosis not present

## 2021-06-09 DIAGNOSIS — R531 Weakness: Secondary | ICD-10-CM | POA: Diagnosis not present

## 2021-06-09 DIAGNOSIS — M7051 Other bursitis of knee, right knee: Secondary | ICD-10-CM | POA: Diagnosis not present

## 2021-06-14 ENCOUNTER — Encounter: Payer: Self-pay | Admitting: Family

## 2021-06-14 ENCOUNTER — Ambulatory Visit (INDEPENDENT_AMBULATORY_CARE_PROVIDER_SITE_OTHER): Payer: Medicare Other | Admitting: Family

## 2021-06-14 ENCOUNTER — Other Ambulatory Visit: Payer: Self-pay

## 2021-06-14 VITALS — BP 122/70 | HR 79 | Temp 97.8°F | Ht 62.0 in | Wt 198.0 lb

## 2021-06-14 DIAGNOSIS — M7051 Other bursitis of knee, right knee: Secondary | ICD-10-CM | POA: Diagnosis not present

## 2021-06-14 DIAGNOSIS — R531 Weakness: Secondary | ICD-10-CM | POA: Diagnosis not present

## 2021-06-14 DIAGNOSIS — R410 Disorientation, unspecified: Secondary | ICD-10-CM

## 2021-06-14 DIAGNOSIS — N390 Urinary tract infection, site not specified: Secondary | ICD-10-CM

## 2021-06-14 DIAGNOSIS — R262 Difficulty in walking, not elsewhere classified: Secondary | ICD-10-CM | POA: Diagnosis not present

## 2021-06-14 LAB — POCT URINALYSIS DIPSTICK
Bilirubin, UA: NEGATIVE
Glucose, UA: NEGATIVE
Ketones, UA: NEGATIVE
Nitrite, UA: NEGATIVE
Protein, UA: NEGATIVE
Spec Grav, UA: 1.01 (ref 1.010–1.025)
Urobilinogen, UA: 0.2 E.U./dL
pH, UA: 7 (ref 5.0–8.0)

## 2021-06-14 NOTE — Patient Instructions (Signed)
-   Urine specimen send to lab will call you in 2-3 days with result   -  Notify provide rif symptoms worsen or running any fever or chills.

## 2021-06-14 NOTE — Progress Notes (Signed)
Provider: Marlowe Sax FNP-C  Walker Paddack, Nelda Bucks, NP  Patient Care Team: Dinesh Ulysse, Nelda Bucks, NP as PCP - General (Family Medicine) Tanda Rockers, MD as Consulting Physician (Pulmonary Disease) Deveron Furlong, NP as Nurse Practitioner (Psychiatry) Pleasant, Eppie Gibson, RN as [place] [facility]  Extended Emergency Contact Information Primary Emergency Contact: Croghan,Roberta(Bobbi) Address: [address]          [place], [place] [address] [name] Phone: [phone] Mobile Phone: [phone] Relation: Daughter Secondary Emergency Contact: Leyba,David Address: [address]           [place], [place] [address] [place] Phone: [phone] Work Phone: [phone] Relation: Son  Code Status:  DNR Goals of care: Advanced Directive information Advanced Directives 05/24/2021  Does Patient Have a Medical Advance Directive? Yes  Type of Advance Directive Out of facility DNR (pink MOST or yellow form)  Does patient want to make changes to medical advance directive? No - Patient declined  Copy of [place] in Chart? -  Would patient like information on creating a medical advance directive? -  Pre-existing out of facility DNR order (yellow form or pink MOST form) -     Chief Complaint  Patient presents with   Acute Visit    Patient c/o of increased confusion. No pain or discomfort when urinating. Patient finished ABX on Friday for UTI. Patient is here with her daughter Tammi Klippel. Bobbi would like referral to neurology if no UTI. Discuss port (still in place) that was used when patient was receiving chemotherapy over a year ago. Patient questions if it should be removed.     HPI:  Pt is a 86 y.o. female seen today for an acute visit for evaluation of increased confusion.she is here with daughter.states completed UTI antibiotics 5 days ago.states has been confused after completing antibiotics. She denies any  fever,chills,abdominal or back pain.  Daughter is concerned ith her confusion would like referral to neurologist if urine culture is negative for UTI.    Past Medical History:  Diagnosis Date   A-fib ([place])    Anemia    Anxiety    Arthritis    hands, spine    Bladder incontinence    Blood transfusion    Cancer ([place]) 02/2019   left breast cancer   Cervicalgia    Constipation    Cough    [place]-onset 08/2009, as of 2014- resolved    Deep venous thrombosis ([place])    post op, rec'd/needed  blood thinner    Depression    Depression with anxiety    Dysrhythmia    a-fib   Falls frequently    pt. reports that she was falling 3 times a day, has had PT at a facility for a while & now is getting home PT 2-3 times/ week    Headache(784.0) 09/27/2011   History of stress test    15-20 yrs.ago    Hypotension    Hypothyroidism    Lumbar radiculopathy 09/26/2011   Pneumonia    never been in hosp. for pneumonia    Prediabetes    Right hand fracture    Monday   Shortness of breath    Sleep apnea    does not use consistently   Past Surgical History:  Procedure Laterality Date   ANTERIOR CERVICAL DECOMP/DISCECTOMY FUSION N/A 07/09/2013   Procedure: ANTERIOR CERVICAL DECOMPRESSION/DISCECTOMY FUSION 3 LEVELS;  Surgeon: Sinclair Ship, MD;  Location: [facility];  Service: Orthopedics;  Laterality: N/A;  Anterior cervical decompression fusion, cervical 3-4, cervical 4-5-, cervical 5-6 with instrumentation, allograft.   APPENDECTOMY     BACK SURGERY     2000   BREAST EXCISIONAL BIOPSY     breast bxs on left x 2, breast bx of right x 1-all benign   EYE SURGERY     cataracts bilateral /w IOL   HERNIA REPAIR     umbilical hernia- [id number]   IR IMAGING GUIDED PORT INSERTION  04/28/2019   JOINT REPLACEMENT  2006,2007   bilateral   LAPAROSCOPIC OVARIAN CYSTECTOMY     not done by laparscopy-abdominal incision   LEG SURGERY     ORIF PERIPROSTHETIC FRACTURE Right 07/26/2019   Procedure: OPEN REDUCTION  INTERNAL FIXATION (ORIF) RIGHT FEMUR PERIPROSTHETIC FRACTURE;  Surgeon: Rod Can, MD;  Location: [facility];  Service: Orthopedics;  Laterality: Right;   PORTACATH PLACEMENT Right 04/01/2019   Procedure: ATTEMPTED INSERTION PORT-A-CATH WITH ULTRASOUND;  Surgeon: [name], MD;  Location: [facility];  Service: General;  Laterality: Right;   RADIOACTIVE SEED GUIDED PARTIAL MASTECTOMY WITH AXILLARY SENTINEL LYMPH NODE BIOPSY Left 04/01/2019   Procedure: LEFT BREAST PARTIAL MASTECTOMY WITH RADIOACTIVE SEED AND LEFT SENTINEL LYMPH NODE BIOPSY;  Surgeon: [name], MD;  Location: [facility];  Service: General;  Laterality: Left;   RE-EXCISION OF BREAST CANCER,SUPERIOR MARGINS Left 04/15/2019   Procedure: RE-EXCISION OF LEFT BREAST CANCER POSITIVE MARGINS;  Surgeon: [name], MD;  Location: [facility];  Service: General;  Laterality: Left;   REPLACEMENT TOTAL KNEE BILATERAL     TONSILLECTOMY      Allergies  Allergen Reactions   Clindamycin Hcl Other (See Comments)    REACTION: swelling, pt. Reports that she had a 16 lb. weightgain in one day    Outpatient Encounter Medications as of 06/14/2021  Medication Sig   albuterol (VENTOLIN HFA) 108 (90 Base) MCG/ACT inhaler Inhale 2 puffs into the lungs every 6 (six) hours as needed for wheezing or shortness of breath.   apixaban (ELIQUIS) 5 MG TABS tablet Take 1 tablet (5 mg total) by mouth 2 (two) times daily.   Brexpiprazole 1 MG TABS Take 1 mg by mouth at bedtime.    busPIRone (BUSPAR) 5 MG tablet Take 5-10 mg by mouth 2 (two) times daily. 10 MG IN THE MORNING, 5 MG AT DINNER   CALCIUM CITRATE PO Take 600 mg by mouth daily.    clonazePAM (KLONOPIN) 0.5 MG tablet Take 1 tablet by mouth as needed in middle of the nite to go back to sleep   CRANBERRY PO Take 2 tablets by mouth daily.   D-Mannose 500 MG CAPS Take by mouth daily.   DULoxetine (CYMBALTA) 30 MG capsule Take 30 mg by mouth in  the morning, at noon, and at bedtime.   furosemide (LASIX) 20 MG tablet Take 1 tablet (20 mg total) by mouth daily.   levETIRAcetam (KEPPRA) 500 MG tablet Take 1 tablet (500 mg total) by mouth 2 (two) times daily.   levothyroxine (SYNTHROID) 50 MCG tablet Take 1 tablet (50 mcg total) by mouth daily before breakfast.   mirabegron ER (MYRBETRIQ) 25 MG TB24 tablet Take 1 tablet (25 mg total) by mouth daily.   mirtazapine (REMERON) 15 MG tablet Take 7.5 mg by mouth at bedtime.   Multiple Vitamins-Minerals (MULTIVITAMIN PO) Take 1 tablet by mouth daily.    oxybutynin (DITROPAN-XL) 10 MG 24 hr tablet Take 10 mg by mouth daily.  oxyCODONE-acetaminophen (PERCOCET) 5-325 MG tablet Take 1 tablet by mouth every 4 (four) hours as needed for severe pain.   potassium chloride SA (KLOR-CON M20) 20 MEQ tablet Take 1 tablet (20 mEq total) by mouth daily.   Probiotic Product (PROBIOTIC DAILY PO) Take 1 tablet by mouth daily. OTC   senna-docusate (SENOKOT-S) 8.6-50 MG tablet Take 1 tablet by mouth 2 (two) times daily as needed for mild constipation.   traZODone (DESYREL) 100 MG tablet Take 100 mg by mouth at bedtime.   vitamin B-12 (CYANOCOBALAMIN) 500 MCG tablet Take 500 mcg by mouth daily.   [DISCONTINUED] amoxicillin-clavulanate (AUGMENTIN) 500-125 MG tablet Take 1 tablet (500 mg total) by mouth in the morning and at bedtime.   [DISCONTINUED] saccharomyces boulardii (FLORASTOR) 250 MG capsule Take 1 capsule (250 mg total) by mouth 2 (two) times daily. Take Probiotics florastor 250 mg capsule one by mouth twice daily x 10 days to prevent diarrhea associated with antibiotics. (Patient not taking: Reported on 06/14/2021)   No facility-administered encounter medications on file as of 06/14/2021.    Review of Systems  Constitutional:  Negative for appetite change, chills, fatigue, fever and unexpected weight change.  HENT:  Negative for congestion, dental problem, ear discharge, ear pain, facial swelling, hearing  loss, nosebleeds, postnasal drip, rhinorrhea, sinus pressure, sinus pain, sneezing, sore throat, tinnitus and trouble swallowing.   Eyes:  Negative for pain, discharge, redness, itching and visual disturbance.  Respiratory:  Negative for cough, chest tightness, shortness of breath and wheezing.   Cardiovascular:  Negative for chest pain, palpitations and leg swelling.  Gastrointestinal:  Negative for abdominal distention, abdominal pain, blood in stool, constipation, diarrhea, nausea and vomiting.  Genitourinary:  Negative for difficulty urinating, dysuria, flank pain, frequency and urgency.  Musculoskeletal:  Positive for gait problem. Negative for arthralgias, back pain and joint swelling.  Skin:  Negative for color change, pallor, rash and wound.  Neurological:  Negative for dizziness, syncope, speech difficulty, weakness, light-headedness, numbness and headaches.  Hematological:  Does not bruise/bleed easily.  Psychiatric/Behavioral:  Positive for confusion. Negative for agitation, behavioral problems, hallucinations and sleep disturbance. The patient is not nervous/anxious.    Immunization History  Administered Date(s) Administered   Fluad Quad(high Dose 65+) 07/22/2020   Influenza Split 08/14/2013   Influenza Whole 08/20/2009   Influenza, High Dose Seasonal PF 09/11/2017, 07/25/2018   Influenza,inj,Quad PF,6+ Mos 08/04/2014, 09/06/2015, 07/10/2016   PFIZER Comirnaty(Gray Top)Covid-19 Tri-Sucrose Vaccine 02/23/2021   PFIZER(Purple Top)SARS-COV-2 Vaccination 12/27/2019, 01/17/2020, 08/30/2020   Pneumococcal Conjugate-13 01/14/2015   Pneumococcal Polysaccharide-23 07/11/2013   Td 11/20/2005, 11/27/2016   Pertinent  Health Maintenance Due  Topic Date Due   OPHTHALMOLOGY EXAM  02/16/2021   HEMOGLOBIN A1C  05/25/2021   INFLUENZA VACCINE  06/20/2021   FOOT EXAM  07/22/2021   URINE MICROALBUMIN  11/26/2021   DEXA SCAN  Completed   PNA vac Low Risk Adult  Completed   Fall Risk   05/24/2021 03/25/2021 02/28/2021 12/23/2020 11/25/2020  Falls in the past year? 0 0 0 1 1  Number falls in past yr: 0 0 0 1 0  Comment - - - - -  Injury with Fall? 0 0 0 0 0  Comment - - - - -  Risk for fall due to : No Fall Risks - - - -  Follow up Falls evaluation completed - - - -   Functional Status Survey:    Vitals:   06/14/21 1443  BP: 122/70  Pulse: 79  Temp: 97.8  F (36.6 C)  TempSrc: Temporal  SpO2: 98%  Weight: 198 lb (89.8 kg)  Height: '5\' 2"'$  (1.575 m)   Body mass index is 36.21 kg/m. Physical Exam Vitals reviewed.  Constitutional:      General: She is not in acute distress.    Appearance: Normal appearance. She is normal weight. She is not ill-appearing or diaphoretic.  HENT:     Head: Normocephalic.     Ears:     Comments: Bilateral hearing aids in place     Nose: Nose normal. No congestion or rhinorrhea.     Mouth/Throat:     Mouth: Mucous membranes are moist.     Pharynx: Oropharynx is clear. No oropharyngeal exudate or posterior oropharyngeal erythema.  Eyes:     General: No scleral icterus.       Right eye: No discharge.        Left eye: No discharge.     Extraocular Movements: Extraocular movements intact.     Conjunctiva/sclera: Conjunctivae normal.     Pupils: Pupils are equal, round, and reactive to light.  Neck:     Vascular: No carotid bruit.  Cardiovascular:     Rate and Rhythm: Normal rate and regular rhythm.     Pulses: Normal pulses.     Heart sounds: Normal heart sounds. No murmur heard.   No friction rub. No gallop.  Pulmonary:     Effort: Pulmonary effort is normal. No respiratory distress.     Breath sounds: Normal breath sounds. No wheezing, rhonchi or rales.  Chest:     Chest wall: No tenderness.  Abdominal:     General: Bowel sounds are normal. There is no distension.     Palpations: Abdomen is soft. There is no mass.     Tenderness: There is no abdominal tenderness. There is no right CVA tenderness, left CVA tenderness, guarding or  rebound.  Musculoskeletal:        General: No swelling or tenderness. Normal range of motion.     Cervical back: Normal range of motion. No rigidity or tenderness.     Right lower leg: No edema.     Left lower leg: No edema.  Lymphadenopathy:     Cervical: No cervical adenopathy.  Skin:    General: Skin is warm and dry.     Coloration: Skin is not pale.     Findings: No bruising, erythema, lesion or rash.  Neurological:     Mental Status: She is alert and oriented to person, place, and time.     Cranial Nerves: No cranial nerve deficit.     Sensory: No sensory deficit.     Motor: No weakness.     Coordination: Coordination normal.     Gait: Gait normal.  Psychiatric:        Mood and Affect: Mood normal.        Speech: Speech normal.        Behavior: Behavior normal.        Thought Content: Thought content normal.        Cognition and Memory: Memory is impaired.        Judgment: Judgment normal.    Labs reviewed: Recent Labs    11/25/20 1220 01/20/21 0000 05/24/21 1149  NA 139 138 139  K 4.2 4.2 4.6  CL 102 101 102  CO2 '28 29 26  '$ GLUCOSE 101 98 96  BUN 28* 24 20  CREATININE 0.90* 0.94* 0.93*  CALCIUM 9.6 8.9 9.5   Recent  Labs    11/25/20 1220 01/20/21 0000 05/24/21 1149  AST '13 13 12  '$ ALT '7 6 6  '$ BILITOT 0.3 0.3 0.4  PROT 6.3 6.3 6.2   Recent Labs    11/25/20 1220 01/20/21 0000 05/24/21 1149  WBC 5.9 6.0 6.6  NEUTROABS 4,219 4,092 4,706  HGB 11.2* 10.7* 11.4*  HCT 35.0 33.3* 37.0  MCV 80.8 82.0 83.5  PLT 289 307 302   Lab Results  Component Value Date   TSH 1.26 05/24/2021   Lab Results  Component Value Date   HGBA1C 5.3 11/25/2020   Lab Results  Component Value Date   CHOL 172 10/16/2018   HDL 56 10/16/2018   LDLCALC 97 10/16/2018   TRIG 98 10/16/2018   CHOLHDL 3.1 10/16/2018    Significant Diagnostic Results in last 30 days:  DG Bone Density  Result Date: 06/07/2021 EXAM: DUAL X-RAY ABSORPTIOMETRY (DXA) FOR BONE MINERAL DENSITY  IMPRESSION: Referring Physician:  Lauree Chandler Your patient completed a bone mineral density test using GE Lunar iDXA system (analysis version: 16). Technologist: [place] PATIENT: Name: Semiko, Gambone Patient ID: [id number] Birth Date: Mar 28, 1935 Height: 62.0 in. Sex: Female Measured: 06/06/2021 Weight: 200.0 lbs. Indications: Advanced Age, Caucasian, Estrogen Deficient, Family Hist. (Parent hip fracture), Postmenopausal, Right hip replacement, History of Fracture (Adult) (V15.51) Fractures: Rt hip Treatments: Calcium (E943.0), Vitamin D (E933.5) ASSESSMENT: The BMD measured at Femur Neck is 0.724 g/cm2 with a T-score of -2.3. This patient is considered osteopenic/low bone mass according to [facility]) criteria. The quality of the exam is limited by the patient having difficulty achieving and maintaining positioning due to mobility issues. The lumbar spine was excluded due to degenerative changes. Right hip excluded due to surgical hardware. Site Region Measured Date Measured Age YA BMD Significant CHANGE T-score Left Femur Neck 06/06/2023 83.9 -2.3 0.724 g/cm2 Left Femur Total 06/06/2021 83.9 -2.0 0.752 g/cm2 Left Forearm Radius 33% 06/06/2021 83.9 -1.6 0.745 g/cm2 World Health Organization [facility]) criteria for post-menopausal, Caucasian Women: Normal       T-score at or above -1 SD Osteopenia   T-score between -1 and -2.5 SD Osteoporosis T-score at or below -2.5 SD RECOMMENDATION: 1. All patients should optimize calcium and vitamin D intake. 2. Consider FDA-approved medical therapies in postmenopausal women and men aged 81 years and older, based on the following: a. A hip or vertebral (clinical or morphometric) fracture. b. T-score = -2.5 at the femoral neck or spine after appropriate evaluation to exclude secondary causes. c. Low bone mass (T-score between -1.0 and -2.5 at the femoral neck or spine) and a 10-year probability of a hip fracture = 3% or a 10-year probability of a major  osteoporosis-related fracture = 20% based on the [place]-adapted WHO algorithm. d. Clinician judgment and/or patient preferences may indicate treatment for people with 10-year fracture probabilities above or below these levels. FOLLOW-UP: Patients with diagnosis of osteoporosis or at high risk for fracture should have regular bone mineral density tests.? Patients eligible for Medicare are allowed routine testing every 2 years.? The testing frequency can be increased to one year for patients who have rapidly progressing disease, are receiving or discontinuing medical therapy to restore bone mass, or have additional risk factors. I have reviewed this study and agree with the findings. [facility] FRAX* 10-year Probability of Fracture Based on femoral neck BMD: Femur (Left) Major Osteoporotic Fracture: 39.0% Hip Fracture:  25.6% Population:                  [place] (Caucasian) Risk Factors: Family Hist. (Parent hip fracture), History of Fracture (Adult) (V15.51) *FRAX is a [place] of [employer] for Metabolic Bone Disease, a [place] (WHO) [facility]. ASSESSMENT: The probability of a major osteoporotic fracture is 39.0% within the next ten years. The probability of a hip fracture is 25.6% within the next ten years. I have reviewed this report and agree with the above findings. Mark A. Thornton Papas, M.D. [facility] Electronically Signed   By: Lavonia Dana M.D.   On: 06/07/2021 07:56    Assessment/Plan 1. Symptomatic urinary tract infection Afebrile.Daughter reports increased confusion after completion of her antibiotics for recent UTI  - POC Urinalysis Dipstick indicates right yellow cloudy,trace blood and moderate Leukocytes and negative for blood.will send for culture made aware culture will return in 2-3 days then will call with results.  - Urine Culture  2. Confusion Afebrile.possible UTI verse cognitive impairment  due to her advance age.will rule out acute etiologies.Daughter would like Neuro referral if negative for UTI  - POC Urinalysis Dipstick - Urine Culture  Family/ staff Communication: Reviewed plan of care with patient and daughter verbalized understanding.  Labs/tests ordered:  - POC Urinalysis Dipstick  - Urine Culture Next Appointment: Has appointment   Sandrea Hughs, NP

## 2021-06-16 DIAGNOSIS — R531 Weakness: Secondary | ICD-10-CM | POA: Diagnosis not present

## 2021-06-16 DIAGNOSIS — R262 Difficulty in walking, not elsewhere classified: Secondary | ICD-10-CM | POA: Diagnosis not present

## 2021-06-16 DIAGNOSIS — M7051 Other bursitis of knee, right knee: Secondary | ICD-10-CM | POA: Diagnosis not present

## 2021-06-16 LAB — URINE CULTURE
MICRO NUMBER:: 12165119
SPECIMEN QUALITY:: ADEQUATE

## 2021-06-17 ENCOUNTER — Other Ambulatory Visit: Payer: Self-pay

## 2021-06-17 DIAGNOSIS — N390 Urinary tract infection, site not specified: Secondary | ICD-10-CM

## 2021-06-17 MED ORDER — CIPROFLOXACIN HCL 500 MG PO TABS: 500.0000 mg | ORAL_TABLET | Freq: Two times a day (BID) | ORAL | 0 refills | Status: DC

## 2021-06-17 MED ORDER — SACCHAROMYCES BOULARDII 250 MG PO CAPS: 250.0000 mg | ORAL_CAPSULE | Freq: Two times a day (BID) | ORAL | 0 refills | Status: DC

## 2021-06-21 DIAGNOSIS — R531 Weakness: Secondary | ICD-10-CM | POA: Diagnosis not present

## 2021-06-21 DIAGNOSIS — M7051 Other bursitis of knee, right knee: Secondary | ICD-10-CM | POA: Diagnosis not present

## 2021-06-21 DIAGNOSIS — R262 Difficulty in walking, not elsewhere classified: Secondary | ICD-10-CM | POA: Diagnosis not present

## 2021-06-23 ENCOUNTER — Other Ambulatory Visit: Payer: Self-pay | Admitting: Family

## 2021-06-23 DIAGNOSIS — E039 Hypothyroidism, unspecified: Secondary | ICD-10-CM

## 2021-06-23 DIAGNOSIS — M7051 Other bursitis of knee, right knee: Secondary | ICD-10-CM | POA: Diagnosis not present

## 2021-06-23 DIAGNOSIS — R262 Difficulty in walking, not elsewhere classified: Secondary | ICD-10-CM | POA: Diagnosis not present

## 2021-06-23 DIAGNOSIS — R531 Weakness: Secondary | ICD-10-CM | POA: Diagnosis not present

## 2021-06-27 ENCOUNTER — Other Ambulatory Visit: Payer: Self-pay | Admitting: *Deleted

## 2021-06-27 MED ORDER — LEVETIRACETAM 500 MG PO TABS: 500.0000 mg | ORAL_TABLET | Freq: Two times a day (BID) | ORAL | 5 refills | Status: DC

## 2021-06-27 NOTE — Telephone Encounter (Signed)
[facility] 

## 2021-06-28 ENCOUNTER — Ambulatory Visit: Payer: Self-pay | Admitting: *Deleted

## 2021-06-28 ENCOUNTER — Other Ambulatory Visit: Payer: Self-pay | Admitting: *Deleted

## 2021-06-28 DIAGNOSIS — R531 Weakness: Secondary | ICD-10-CM | POA: Diagnosis not present

## 2021-06-28 DIAGNOSIS — M7051 Other bursitis of knee, right knee: Secondary | ICD-10-CM | POA: Diagnosis not present

## 2021-06-28 DIAGNOSIS — R262 Difficulty in walking, not elsewhere classified: Secondary | ICD-10-CM | POA: Diagnosis not present

## 2021-06-28 NOTE — Patient Outreach (Signed)
[facility]) Care Management  06/28/2021  Andrea [name] 1935-07-19 [id number]   RN Health Coach made multiple unsuccessful outreach calls. RN sent unsuccessful outreach letter with no response,  Plan: Case closure due to unsuccessful outreach  [place] [phone]

## 2021-07-01 ENCOUNTER — Other Ambulatory Visit: Payer: Self-pay | Admitting: Family

## 2021-07-01 DIAGNOSIS — R0602 Shortness of breath: Secondary | ICD-10-CM

## 2021-07-04 ENCOUNTER — Telehealth: Payer: Self-pay

## 2021-07-04 NOTE — Telephone Encounter (Signed)
Bobbi, patient's daughter came in to pick up samples of mybetriq and stated that she wanted to know if she could have her mother's urine rechecked. She states she had finished antibiotic and is still having symptoms. She is still having confusion. Tammi Klippel would like to know if she can just come pick up cup to do urine or if you would like for her to make an appointment.  Please advise.   Message routed to Marlowe Sax, NP

## 2021-07-04 NOTE — Telephone Encounter (Signed)
Andrea [name] will try to pick up cup tomorrow for urine.

## 2021-07-04 NOTE — Telephone Encounter (Signed)
Daughter may pickup cup for urine analysis,culture and sensitivity since she was recent seen.will consider referral to Urologist if urine results are positive for UTI.

## 2021-07-05 ENCOUNTER — Other Ambulatory Visit (INDEPENDENT_AMBULATORY_CARE_PROVIDER_SITE_OTHER): Payer: Medicare Other

## 2021-07-05 DIAGNOSIS — R262 Difficulty in walking, not elsewhere classified: Secondary | ICD-10-CM | POA: Diagnosis not present

## 2021-07-05 DIAGNOSIS — R399 Unspecified symptoms and signs involving the genitourinary system: Secondary | ICD-10-CM | POA: Diagnosis not present

## 2021-07-05 DIAGNOSIS — M7051 Other bursitis of knee, right knee: Secondary | ICD-10-CM | POA: Diagnosis not present

## 2021-07-05 DIAGNOSIS — R531 Weakness: Secondary | ICD-10-CM | POA: Diagnosis not present

## 2021-07-05 LAB — POCT URINALYSIS DIPSTICK
Bilirubin, UA: NEGATIVE
Glucose, UA: NEGATIVE
Ketones, UA: NEGATIVE
Nitrite, UA: NEGATIVE
Protein, UA: NEGATIVE
Spec Grav, UA: 1.01 (ref 1.010–1.025)
Urobilinogen, UA: 0.2 E.U./dL
pH, UA: 6.5 (ref 5.0–8.0)

## 2021-07-07 DIAGNOSIS — R262 Difficulty in walking, not elsewhere classified: Secondary | ICD-10-CM | POA: Diagnosis not present

## 2021-07-07 DIAGNOSIS — M7051 Other bursitis of knee, right knee: Secondary | ICD-10-CM | POA: Diagnosis not present

## 2021-07-07 DIAGNOSIS — R531 Weakness: Secondary | ICD-10-CM | POA: Diagnosis not present

## 2021-07-07 LAB — URINE CULTURE
MICRO NUMBER:: 12253565
SPECIMEN QUALITY:: ADEQUATE

## 2021-07-08 ENCOUNTER — Other Ambulatory Visit: Payer: Self-pay

## 2021-07-08 ENCOUNTER — Other Ambulatory Visit: Payer: Self-pay | Admitting: Family

## 2021-07-08 DIAGNOSIS — N390 Urinary tract infection, site not specified: Secondary | ICD-10-CM

## 2021-07-08 MED ORDER — SACCHAROMYCES BOULARDII 250 MG PO CAPS: 250.0000 mg | ORAL_CAPSULE | Freq: Two times a day (BID) | ORAL | 0 refills | Status: DC

## 2021-07-08 MED ORDER — CIPROFLOXACIN HCL 500 MG PO TABS: 500.0000 mg | ORAL_TABLET | Freq: Two times a day (BID) | ORAL | 0 refills | Status: DC

## 2021-07-12 DIAGNOSIS — R531 Weakness: Secondary | ICD-10-CM | POA: Diagnosis not present

## 2021-07-12 DIAGNOSIS — R262 Difficulty in walking, not elsewhere classified: Secondary | ICD-10-CM | POA: Diagnosis not present

## 2021-07-12 DIAGNOSIS — M7051 Other bursitis of knee, right knee: Secondary | ICD-10-CM | POA: Diagnosis not present

## 2021-07-14 DIAGNOSIS — R531 Weakness: Secondary | ICD-10-CM | POA: Diagnosis not present

## 2021-07-14 DIAGNOSIS — M7051 Other bursitis of knee, right knee: Secondary | ICD-10-CM | POA: Diagnosis not present

## 2021-07-14 DIAGNOSIS — R262 Difficulty in walking, not elsewhere classified: Secondary | ICD-10-CM | POA: Diagnosis not present

## 2021-07-19 DIAGNOSIS — N390 Urinary tract infection, site not specified: Secondary | ICD-10-CM | POA: Diagnosis not present

## 2021-07-19 DIAGNOSIS — R262 Difficulty in walking, not elsewhere classified: Secondary | ICD-10-CM | POA: Diagnosis not present

## 2021-07-19 DIAGNOSIS — M7051 Other bursitis of knee, right knee: Secondary | ICD-10-CM | POA: Diagnosis not present

## 2021-07-19 DIAGNOSIS — R531 Weakness: Secondary | ICD-10-CM | POA: Diagnosis not present

## 2021-07-19 DIAGNOSIS — N3281 Overactive bladder: Secondary | ICD-10-CM | POA: Diagnosis not present

## 2021-07-21 DIAGNOSIS — R531 Weakness: Secondary | ICD-10-CM | POA: Diagnosis not present

## 2021-07-21 DIAGNOSIS — R262 Difficulty in walking, not elsewhere classified: Secondary | ICD-10-CM | POA: Diagnosis not present

## 2021-07-21 DIAGNOSIS — M7051 Other bursitis of knee, right knee: Secondary | ICD-10-CM | POA: Diagnosis not present

## 2021-07-22 DIAGNOSIS — L603 Nail dystrophy: Secondary | ICD-10-CM | POA: Diagnosis not present

## 2021-07-22 DIAGNOSIS — I739 Peripheral vascular disease, unspecified: Secondary | ICD-10-CM | POA: Diagnosis not present

## 2021-07-22 DIAGNOSIS — H18519 Endothelial corneal dystrophy, unspecified eye: Secondary | ICD-10-CM | POA: Diagnosis not present

## 2021-07-26 DIAGNOSIS — M7051 Other bursitis of knee, right knee: Secondary | ICD-10-CM | POA: Diagnosis not present

## 2021-07-26 DIAGNOSIS — R531 Weakness: Secondary | ICD-10-CM | POA: Diagnosis not present

## 2021-07-26 DIAGNOSIS — R262 Difficulty in walking, not elsewhere classified: Secondary | ICD-10-CM | POA: Diagnosis not present

## 2021-07-28 DIAGNOSIS — R262 Difficulty in walking, not elsewhere classified: Secondary | ICD-10-CM | POA: Diagnosis not present

## 2021-07-28 DIAGNOSIS — M7051 Other bursitis of knee, right knee: Secondary | ICD-10-CM | POA: Diagnosis not present

## 2021-07-28 DIAGNOSIS — R531 Weakness: Secondary | ICD-10-CM | POA: Diagnosis not present

## 2021-08-02 DIAGNOSIS — R531 Weakness: Secondary | ICD-10-CM | POA: Diagnosis not present

## 2021-08-02 DIAGNOSIS — M7051 Other bursitis of knee, right knee: Secondary | ICD-10-CM | POA: Diagnosis not present

## 2021-08-02 DIAGNOSIS — R262 Difficulty in walking, not elsewhere classified: Secondary | ICD-10-CM | POA: Diagnosis not present

## 2021-08-03 ENCOUNTER — Other Ambulatory Visit: Payer: Self-pay | Admitting: *Deleted

## 2021-08-03 DIAGNOSIS — Z8781 Personal history of (healed) traumatic fracture: Secondary | ICD-10-CM

## 2021-08-03 MED ORDER — OXYCODONE-ACETAMINOPHEN 5-325 MG PO TABS: 1.0000 | ORAL_TABLET | ORAL | 0 refills | Status: DC | PRN

## 2021-08-03 NOTE — Telephone Encounter (Signed)
Patient daughter requested refill.  Epic LR: 05/02/2021 Contract on File.  Pended Rx and sent to [facility] for approval.

## 2021-08-04 DIAGNOSIS — R531 Weakness: Secondary | ICD-10-CM | POA: Diagnosis not present

## 2021-08-04 DIAGNOSIS — M7051 Other bursitis of knee, right knee: Secondary | ICD-10-CM | POA: Diagnosis not present

## 2021-08-04 DIAGNOSIS — R262 Difficulty in walking, not elsewhere classified: Secondary | ICD-10-CM | POA: Diagnosis not present

## 2021-08-10 DIAGNOSIS — M7051 Other bursitis of knee, right knee: Secondary | ICD-10-CM | POA: Diagnosis not present

## 2021-08-10 DIAGNOSIS — R262 Difficulty in walking, not elsewhere classified: Secondary | ICD-10-CM | POA: Diagnosis not present

## 2021-08-10 DIAGNOSIS — R531 Weakness: Secondary | ICD-10-CM | POA: Diagnosis not present

## 2021-08-11 DIAGNOSIS — M7051 Other bursitis of knee, right knee: Secondary | ICD-10-CM | POA: Diagnosis not present

## 2021-08-11 DIAGNOSIS — R531 Weakness: Secondary | ICD-10-CM | POA: Diagnosis not present

## 2021-08-11 DIAGNOSIS — R262 Difficulty in walking, not elsewhere classified: Secondary | ICD-10-CM | POA: Diagnosis not present

## 2021-08-12 DIAGNOSIS — N390 Urinary tract infection, site not specified: Secondary | ICD-10-CM | POA: Diagnosis not present

## 2021-08-22 DIAGNOSIS — F341 Dysthymic disorder: Secondary | ICD-10-CM | POA: Diagnosis not present

## 2021-08-22 DIAGNOSIS — F332 Major depressive disorder, recurrent severe without psychotic features: Secondary | ICD-10-CM | POA: Diagnosis not present

## 2021-08-22 DIAGNOSIS — F411 Generalized anxiety disorder: Secondary | ICD-10-CM | POA: Diagnosis not present

## 2021-08-23 DIAGNOSIS — R262 Difficulty in walking, not elsewhere classified: Secondary | ICD-10-CM | POA: Diagnosis not present

## 2021-08-23 DIAGNOSIS — R531 Weakness: Secondary | ICD-10-CM | POA: Diagnosis not present

## 2021-08-23 DIAGNOSIS — M7051 Other bursitis of knee, right knee: Secondary | ICD-10-CM | POA: Diagnosis not present

## 2021-08-24 DIAGNOSIS — N950 Postmenopausal bleeding: Secondary | ICD-10-CM | POA: Diagnosis not present

## 2021-08-30 DIAGNOSIS — N950 Postmenopausal bleeding: Secondary | ICD-10-CM | POA: Diagnosis not present

## 2021-08-30 DIAGNOSIS — N858 Other specified noninflammatory disorders of uterus: Secondary | ICD-10-CM | POA: Diagnosis not present

## 2021-09-07 DIAGNOSIS — N3946 Mixed incontinence: Secondary | ICD-10-CM | POA: Diagnosis not present

## 2021-09-14 DIAGNOSIS — Z23 Encounter for immunization: Secondary | ICD-10-CM | POA: Diagnosis not present

## 2021-09-24 ENCOUNTER — Other Ambulatory Visit: Payer: Self-pay | Admitting: Family

## 2021-09-24 DIAGNOSIS — Z7901 Long term (current) use of anticoagulants: Secondary | ICD-10-CM

## 2021-09-26 ENCOUNTER — Other Ambulatory Visit: Payer: Self-pay | Admitting: Family

## 2021-09-26 DIAGNOSIS — F411 Generalized anxiety disorder: Secondary | ICD-10-CM | POA: Diagnosis not present

## 2021-09-26 DIAGNOSIS — F332 Major depressive disorder, recurrent severe without psychotic features: Secondary | ICD-10-CM | POA: Diagnosis not present

## 2021-09-26 DIAGNOSIS — E039 Hypothyroidism, unspecified: Secondary | ICD-10-CM

## 2021-09-26 DIAGNOSIS — F341 Dysthymic disorder: Secondary | ICD-10-CM | POA: Diagnosis not present

## 2021-09-28 ENCOUNTER — Telehealth: Payer: Self-pay

## 2021-09-28 NOTE — Telephone Encounter (Signed)
Daughter, Andrea [name] called wanting a referral to neurology due to question of increase of dementia. Patient was given appointment for 09/29/21 with Marlowe Sax, NP

## 2021-09-29 ENCOUNTER — Ambulatory Visit (INDEPENDENT_AMBULATORY_CARE_PROVIDER_SITE_OTHER): Payer: Medicare Other | Admitting: Family

## 2021-09-29 ENCOUNTER — Other Ambulatory Visit: Payer: Self-pay

## 2021-09-29 ENCOUNTER — Encounter: Payer: Self-pay | Admitting: Family

## 2021-09-29 VITALS — BP 118/78 | HR 69 | Temp 97.5°F | Ht 62.0 in | Wt 206.8 lb

## 2021-09-29 DIAGNOSIS — R4189 Other symptoms and signs involving cognitive functions and awareness: Secondary | ICD-10-CM

## 2021-09-29 DIAGNOSIS — N390 Urinary tract infection, site not specified: Secondary | ICD-10-CM

## 2021-09-29 DIAGNOSIS — R410 Disorientation, unspecified: Secondary | ICD-10-CM

## 2021-09-29 LAB — POCT URINALYSIS DIPSTICK
Bilirubin, UA: NEGATIVE
Blood, UA: NEGATIVE
Glucose, UA: NEGATIVE
Ketones, UA: NEGATIVE
Nitrite, UA: NEGATIVE
Protein, UA: NEGATIVE
Spec Grav, UA: 1.01 (ref 1.010–1.025)
Urobilinogen, UA: 0.2 E.U./dL
pH, UA: 7 (ref 5.0–8.0)

## 2021-09-29 NOTE — Progress Notes (Signed)
Provider: Marlowe Sax FNP-C  Charmagne Buhl, Nelda Bucks, NP  Patient Care Team: Dajia Gunnels, Nelda Bucks, NP as PCP - General (Family Medicine) Tanda Rockers, MD as Consulting Physician (Pulmonary Disease) Deveron Furlong, NP as Nurse Practitioner (Psychiatry)  Extended Emergency Contact Information Primary Emergency Contact: Soderman,Roberta(Bobbi) Address: [address]          [place], [place] [address] [name] of [place] Phone: [phone] Mobile Phone: [phone] Relation: Daughter Secondary Emergency Contact: Broad,David Address: [address]           [place], [place] [address] [place] Phone: [phone] Work Phone: [phone] Relation: Son  Code Status:  DNR Goals of care: Advanced Directive information Advanced Directives 05/24/2021  Does Patient Have a Medical Advance Directive? Yes  Type of Advance Directive Out of facility DNR (pink MOST or yellow form)  Does patient want to make changes to medical advance directive? No - Patient declined  Copy of [place] in Chart? -  Would patient like information on creating a medical advance directive? -  Pre-existing out of facility DNR order (yellow form or pink MOST form) -     Chief Complaint  Patient presents with   Acute Visit    Patient returns to the office for a referral to neurology.     HPI:  Pt is a 86 y.o. female seen today for an acute visit for referral to neurologist.Care giver states she is more confused and more forgetful. Sometimes tells family she slept in a different room but woke up in her room.care giver states there are only two rooms one for the daughter and the other for patient.some times holds walker but does not know what to do with it.Care giver has to remind her things.states usually has a UTI whenever she has such symptoms but would like Neurology evaluation.  She denies any denies any fever,chills,nausea,vomiting,abdominal pain,flank  pain,urgency,frequency,dysuria,difficult urination or hematuria,    Past Medical History:  Diagnosis Date   A-fib ([place])    Anemia    Anxiety    Arthritis    hands, spine    Bladder incontinence    Blood transfusion    Cancer ([place]) 02/2019   left breast cancer   Cervicalgia    Constipation    Cough    [place]-onset 08/2009, as of 2014- resolved    Deep venous thrombosis ([place])    post op, rec'd/needed  blood thinner    Depression    Depression with anxiety    Dysrhythmia    a-fib   Falls frequently    pt. reports that she was falling 3 times a day, has had PT at a facility for a while & now is getting home PT 2-3 times/ week    Headache(784.0) 09/27/2011   History of stress test    15-20 yrs.ago    Hypotension    Hypothyroidism    Lumbar radiculopathy 09/26/2011   Pneumonia    never been in hosp. for pneumonia    Prediabetes    Right hand fracture    Monday   Shortness of breath    Sleep apnea    does not use consistently   Past Surgical History:  Procedure Laterality Date   ANTERIOR CERVICAL DECOMP/DISCECTOMY FUSION N/A 07/09/2013   Procedure: ANTERIOR CERVICAL DECOMPRESSION/DISCECTOMY FUSION 3 LEVELS;  Surgeon: Sinclair Ship, MD;  Location: [facility];  Service: Orthopedics;  Laterality: N/A;  Anterior cervical decompression fusion, cervical 3-4, cervical 4-5-, cervical 5-6 with  instrumentation, allograft.   APPENDECTOMY     BACK SURGERY     2000   BREAST EXCISIONAL BIOPSY     breast bxs on left x 2, breast bx of right x 1-all benign   EYE SURGERY     cataracts bilateral /w IOL   HERNIA REPAIR     umbilical hernia- 1751   IR IMAGING GUIDED PORT INSERTION  04/28/2019   JOINT REPLACEMENT  2006,2007   bilateral   LAPAROSCOPIC OVARIAN CYSTECTOMY     not done by laparscopy-abdominal incision   LEG SURGERY     ORIF PERIPROSTHETIC FRACTURE Right 07/26/2019   Procedure: OPEN REDUCTION INTERNAL FIXATION (ORIF) RIGHT FEMUR PERIPROSTHETIC FRACTURE;  Surgeon: Rod Can, MD;  Location: [facility];  Service: Orthopedics;  Laterality: Right;   PORTACATH PLACEMENT Right 04/01/2019   Procedure: ATTEMPTED INSERTION PORT-A-CATH WITH ULTRASOUND;  Surgeon: [name], MD;  Location: [facility];  Service: General;  Laterality: Right;   RADIOACTIVE SEED GUIDED PARTIAL MASTECTOMY WITH AXILLARY SENTINEL LYMPH NODE BIOPSY Left 04/01/2019   Procedure: LEFT BREAST PARTIAL MASTECTOMY WITH RADIOACTIVE SEED AND LEFT SENTINEL LYMPH NODE BIOPSY;  Surgeon: [name], MD;  Location: [facility];  Service: General;  Laterality: Left;   RE-EXCISION OF BREAST CANCER,SUPERIOR MARGINS Left 04/15/2019   Procedure: RE-EXCISION OF LEFT BREAST CANCER POSITIVE MARGINS;  Surgeon: [name], MD;  Location: [facility];  Service: General;  Laterality: Left;   REPLACEMENT TOTAL KNEE BILATERAL     TONSILLECTOMY      Allergies  Allergen Reactions   Clindamycin Hcl Other (See Comments)    REACTION: swelling, pt. Reports that she had a 16 lb. weightgain in one day    Outpatient Encounter Medications as of 09/29/2021  Medication Sig   albuterol (VENTOLIN HFA) 108 (90 Base) MCG/ACT inhaler Inhale 2 puffs into the lungs every 6 (six) hours as needed for wheezing or shortness of breath.   Brexpiprazole 1 MG TABS Take 1 mg by mouth at bedtime.    busPIRone (BUSPAR) 5 MG tablet Take 5-10 mg by mouth 2 (two) times daily. 10 MG IN THE MORNING, 5 MG AT DINNER   CALCIUM CITRATE PO Take 600 mg by mouth daily.    ciprofloxacin (CIPRO) 500 MG tablet Take 1 tablet (500 mg total) by mouth 2 (two) times daily.   clonazePAM (KLONOPIN) 0.5 MG tablet Take 1 tablet by mouth as needed in middle of the nite to go back to sleep   CRANBERRY PO Take 2 tablets by mouth daily.   D-Mannose 500 MG CAPS Take by mouth daily.   DULoxetine (CYMBALTA) 30 MG capsule Take 30 mg by mouth in the morning, at noon, and at bedtime.   ELIQUIS 5 MG TABS tablet TAKE 1  TABLET BY MOUTH TWICE A DAY   furosemide (LASIX) 20 MG tablet TAKE 1 TABLET BY MOUTH EVERY DAY   levETIRAcetam (KEPPRA) 500 MG tablet Take 1 tablet (500 mg total) by mouth 2 (two) times daily.   levothyroxine (SYNTHROID) 50 MCG tablet TAKE 1 TABLET BY MOUTH EVERY DAY BEFORE BREAKFAST   mirtazapine (REMERON) 15 MG tablet Take 7.5 mg by mouth at bedtime.   Multiple Vitamins-Minerals (MULTIVITAMIN PO) Take 1 tablet by mouth daily.    oxybutynin (DITROPAN-XL) 10 MG 24 hr tablet Take 10 mg by mouth daily.   oxyCODONE-acetaminophen (PERCOCET) 5-325 MG tablet Take 1 tablet by mouth every 4 (four) hours as needed for severe pain.   potassium chloride SA (  KLOR-CON M20) 20 MEQ tablet Take 1 tablet (20 mEq total) by mouth daily.   Probiotic Product (PROBIOTIC DAILY PO) Take 1 tablet by mouth daily. OTC   senna-docusate (SENOKOT-S) 8.6-50 MG tablet Take 1 tablet by mouth 2 (two) times daily as needed for mild constipation.   traZODone (DESYREL) 100 MG tablet Take 100 mg by mouth at bedtime.   vitamin B-12 (CYANOCOBALAMIN) 500 MCG tablet Take 500 mcg by mouth daily.   [DISCONTINUED] mirabegron ER (MYRBETRIQ) 25 MG TB24 tablet Take 1 tablet (25 mg total) by mouth daily.   [DISCONTINUED] saccharomyces boulardii (FLORASTOR) 250 MG capsule Take 1 capsule (250 mg total) by mouth 2 (two) times daily.   No facility-administered encounter medications on file as of 09/29/2021.    Review of Systems  Constitutional:  Negative for appetite change, chills, fatigue, fever and unexpected weight change.  HENT:  Negative for congestion, dental problem, ear discharge, ear pain, facial swelling, hearing loss, nosebleeds, postnasal drip, rhinorrhea, sinus pressure, sinus pain, sneezing, sore throat, tinnitus and trouble swallowing.   Eyes:  Negative for pain, discharge, redness, itching and visual disturbance.  Respiratory:  Negative for cough, chest tightness, shortness of breath and wheezing.   Cardiovascular:  Negative  for chest pain, palpitations and leg swelling.  Gastrointestinal:  Negative for abdominal distention, abdominal pain, constipation, diarrhea, nausea and vomiting.  Genitourinary:  Negative for difficulty urinating, dysuria, flank pain, frequency and urgency.  Musculoskeletal:  Positive for arthralgias and gait problem. Negative for back pain, joint swelling, myalgias, neck pain and neck stiffness.       Right knee pain   Skin:  Negative for color change, pallor, rash and wound.  Neurological:  Negative for dizziness, syncope, speech difficulty, weakness, light-headedness, numbness and headaches.  Psychiatric/Behavioral:  Negative for agitation, behavioral problems, hallucinations and sleep disturbance. The patient is not nervous/anxious.        Memory lapse    Immunization History  Administered Date(s) Administered   Fluad Quad(high Dose 65+) 07/22/2020   Influenza Split 08/14/2013   Influenza Whole 08/20/2009   Influenza, High Dose Seasonal PF 09/11/2017, 07/25/2018   Influenza,inj,Quad PF,6+ Mos 08/04/2014, 09/06/2015, 07/10/2016   PFIZER Comirnaty(Gray Top)Covid-19 Tri-Sucrose Vaccine 02/23/2021   PFIZER(Purple Top)SARS-COV-2 Vaccination 12/27/2019, 01/17/2020, 08/30/2020   Pneumococcal Conjugate-13 01/14/2015   Pneumococcal Polysaccharide-23 07/11/2013   Td 11/20/2005, 11/27/2016   Pertinent  Health Maintenance Due  Topic Date Due   OPHTHALMOLOGY EXAM  02/16/2021   HEMOGLOBIN A1C  05/25/2021   INFLUENZA VACCINE  06/20/2021   FOOT EXAM  07/22/2021   URINE MICROALBUMIN  11/26/2021   DEXA SCAN  Completed   Fall Risk 12/23/2020 02/28/2021 03/25/2021 05/24/2021 09/29/2021  Falls in the past year? 1 0 0 0 0  Number of falls in past year - - - - -  Was there an injury with Fall? 0 0 0 0 0  Was there an injury with Fall? - - - - -  Fall Risk Category Calculator 2 0 0 0 0  Fall Risk Category Moderate Low Low Low Low  Patient Fall Risk Level Moderate fall risk Low fall risk Low fall risk  Low fall risk Low fall risk  Patient at Risk for Falls Due to - - - No Fall Risks No Fall Risks  Fall risk Follow up - - - Falls evaluation completed Falls evaluation completed   Functional Status Survey:    Vitals:   09/29/21 1458  BP: 118/78  Pulse: 69  Temp: (!) 97.5 F (36.4  C)  SpO2: 99%  Weight: 206 lb 12.8 oz (93.8 kg)  Height: 5\' 2"  (1.575 m)   Body mass index is 37.82 kg/m. Physical Exam Vitals reviewed.  Constitutional:      General: She is not in acute distress.    Appearance: Normal appearance. She is obese. She is not ill-appearing or diaphoretic.  HENT:     Head: Normocephalic.     Right Ear: Tympanic membrane, ear canal and external ear normal. There is no impacted cerumen.     Left Ear: Tympanic membrane, ear canal and external ear normal. There is no impacted cerumen.     Nose: Nose normal. No congestion or rhinorrhea.     Mouth/Throat:     Mouth: Mucous membranes are moist.     Pharynx: Oropharynx is clear. No oropharyngeal exudate or posterior oropharyngeal erythema.  Eyes:     General: No scleral icterus.       Right eye: No discharge.        Left eye: No discharge.     Extraocular Movements: Extraocular movements intact.     Conjunctiva/sclera: Conjunctivae normal.     Pupils: Pupils are equal, round, and reactive to light.  Cardiovascular:     Rate and Rhythm: Normal rate and regular rhythm.     Pulses: Normal pulses.     Heart sounds: Normal heart sounds. No murmur heard.   No friction rub. No gallop.  Pulmonary:     Effort: Pulmonary effort is normal. No respiratory distress.     Breath sounds: Normal breath sounds. No wheezing, rhonchi or rales.  Chest:     Chest wall: No tenderness.  Abdominal:     General: Bowel sounds are normal. There is no distension.     Palpations: Abdomen is soft. There is no mass.     Tenderness: There is no abdominal tenderness. There is no right CVA tenderness, left CVA tenderness, guarding or rebound.   Musculoskeletal:        General: No swelling or tenderness. Normal range of motion.     Right lower leg: No edema.     Left lower leg: No edema.     Comments: Unsteady gait on wheelchair during visit   Skin:    General: Skin is warm and dry.     Coloration: Skin is not pale.     Findings: No bruising, erythema, lesion or rash.  Neurological:     Mental Status: She is alert.     Cranial Nerves: No cranial nerve deficit.     Sensory: No sensory deficit.     Motor: No weakness.     Coordination: Coordination normal.     Gait: Gait abnormal.     Comments: Worsening memory loss   Psychiatric:        Mood and Affect: Mood normal.        Speech: Speech normal.        Behavior: Behavior normal.        Thought Content: Thought content normal.        Cognition and Memory: Memory is impaired.     Comments: Scored 13/30 on MMSE     Labs reviewed: Recent Labs    11/25/20 1220 01/20/21 0000 05/24/21 1149  NA 139 138 139  K 4.2 4.2 4.6  CL 102 101 102  CO2 28 29 26   GLUCOSE 101 98 96  BUN 28* 24 20  CREATININE 0.90* 0.94* 0.93*  CALCIUM 9.6 8.9 9.5   Recent Labs  11/25/20 1220 01/20/21 0000 05/24/21 1149  AST 13 13 12   ALT 7 6 6   BILITOT 0.3 0.3 0.4  PROT 6.3 6.3 6.2   Recent Labs    11/25/20 1220 01/20/21 0000 05/24/21 1149  WBC 5.9 6.0 6.6  NEUTROABS 4,219 4,092 4,706  HGB 11.2* 10.7* 11.4*  HCT 35.0 33.3* 37.0  MCV 80.8 82.0 83.5  PLT 289 307 302   Lab Results  Component Value Date   TSH 1.26 05/24/2021   Lab Results  Component Value Date   HGBA1C 5.3 11/25/2020   Lab Results  Component Value Date   CHOL 172 10/16/2018   HDL 56 10/16/2018   LDLCALC 97 10/16/2018   TRIG 98 10/16/2018   CHOLHDL 3.1 10/16/2018    Significant Diagnostic Results in last 30 days:  No results found.  Assessment/Plan 1. Cognitive impairment Worsening memory loss  Scored 13/30 on MMSE severe cognitive impairment  Request Neuro referral  - Ambulatory referral to  Neurology  2. Confusion Possible multifactorial due to worsening cognitive  or ?UTI  Will obtain urine specimen to rule out UTI Urine dip stick showed yellow clear urine negative for blood and Nitrites but positive for moderate Leukocytes.  - Culture, Urine Notify provider if symptoms worsen or fail to improve   3. Frequent UTI Afebrile  - POC Urinalysis Dipstick showed yellow clear urine negative for blood and Nitrites but positive for moderate Leukocytes.  Family/ staff Communication: Reviewed plan of care with patient and care giver verbalized understanding   Labs/tests ordered:  - Culture, Urine - POC Urinalysis   Next Appointment: As needed if symptoms worsen or fail to improve    Sandrea Hughs, NP

## 2021-09-29 NOTE — Patient Instructions (Signed)
-   Notify provider if symptoms worsen or fail to improve  °

## 2021-10-02 LAB — URINE CULTURE
MICRO NUMBER:: 12621804
SPECIMEN QUALITY:: ADEQUATE

## 2021-10-04 DIAGNOSIS — Z20828 Contact with and (suspected) exposure to other viral communicable diseases: Secondary | ICD-10-CM | POA: Diagnosis not present

## 2021-10-05 DIAGNOSIS — R102 Pelvic and perineal pain: Secondary | ICD-10-CM | POA: Diagnosis not present

## 2021-10-10 DIAGNOSIS — N950 Postmenopausal bleeding: Secondary | ICD-10-CM | POA: Diagnosis not present

## 2021-10-16 NOTE — Progress Notes (Signed)
Assessment/Plan:   Andrea [name] is a 86 y.o. year old female with risk factors including  age, anxiety,depression, L Breast cancer, h/o DVT, Afib on AC, recurrent UTIs, hypothyroidism, prediabetes, OSA not consistent with CPAP,   seen today for evaluation of memory loss.  Last MMSE performed as her PCP was 13/30.  Andrea [name] declined repeating Andrea MMSE or [place] here.  Results are consistent with dementia, likely mixed vascular and Alzheimer's disease.  Currently, she is not on antidementia medications.    Recommendations:   Dementia without behavioral disturbance likely mixed vascular and Alzheimer's disease  MRI brain with/without contrast to assess for underlying structural abnormality and assess vascular load  Discussed safety both in and out of Andrea home.  Discussed Andrea importance of regular daily schedule with inclusion of crossword puzzles to maintain brain function.  Continue to monitor mood with PCP.  Stay active at least 30 minutes at least 3 times a week.  Naps should be scheduled and should be no longer than 60 minutes and should not occur after 2 PM.  Mediterranean diet is recommended  Folllow up once results above are available   Subjective:    Andrea [name] is seen in neurologic consultation at Andrea request of Ngetich, Dinah C, NP for Andrea evaluation of memory loss.  Andrea [name] is accompanied by her caregiver who supplements Andrea history. This is a 86 y.o. year old female who has had worsening memory issues for about few months when she would get UTIs ( 5 bouts of them ).  However, Andrea caregiver reports that her memory changes may have been present for at least 1-1/2-year, when she appears to be more disoriented, and has more trouble to remember new information, or people's names, or "mixes them ".  For example, she has a son who lives in [place], she cannot remember his name.  She denies repeating Andrea same stories or asking Andrea same questions.  However, she has been  forgetting for example, how to sit in a chair or which way she needs to go when she is going from one room to another.  Andrea [name] states that "it may have been there for a longer time than that, but I did not want to tell anyone".  She denies leaving objects in unusual places.  She lives with her daughter.  Her mood is usually good, only when Andrea caregiver is not there, she may show more concern, and feel "more down".  When Andrea caregiver is there, she has more social interaction.  She no longer drives.  She sleeps well, "a lot more than before ".  She denies any vivid dreams or sleepwalking.  No hallucinations or paranoia.  She uses a walker to ambulate, and a wheelchair at times.  In Andrea past, she tried physical therapy 2-3 times a week, but she is no longer interested, "she did not like it ".  No recent falls or head injuries.  Andrea caregiver is not aware of any hygiene concerns, she needs assistance with bathing and dressing.  Andrea caregiver is in charge of Andrea medications, and Andrea finances are taking care of by Andrea daughter.  Her appetite is good, denies trouble swallowing.  She no longer cooks. Denies headaches, double vision, dizziness, focal numbness or tingling, unilateral weakness or tremors or anosmia. No history of seizures.  As mentioned above, she has frequent UTIs, wears pads and pull ups , retention, denies constipation or diarrhea.  She denies anosmia, she did not  have any recent COVID.  She has OSA, but lately has not been using Andrea CPAP, because "makes me cold ".  She denies any alcohol or tobacco abuse.  Family history negative for Alzheimer's disease or any other type of dementia.   Labs 05/2021 TSH 1.26, B12 1272    Allergies  Allergen Reactions   Clindamycin Hcl Other (See Comments)    REACTION: swelling, pt. Reports that she had a 16 lb. weightgain in one day    Current Outpatient Medications  Medication Instructions   albuterol (VENTOLIN HFA) 108 (90 Base) MCG/ACT inhaler 2  puffs, Inhalation, Every 6 hours PRN   brexpiprazole (REXULTI) 1 mg, Oral, Daily at bedtime   busPIRone (BUSPAR) 5-10 mg, Oral, 2 times daily, 10 MG IN Andrea MORNING, 5 MG AT DINNER   CALCIUM CITRATE PO 600 mg, Oral, Daily   ciprofloxacin (CIPRO) 500 mg, Oral, 2 times daily   clonazePAM (KLONOPIN) 0.5 MG tablet Take 1 tablet by mouth as needed in middle of Andrea nite to go back to sleep   CRANBERRY PO 2 tablets, Oral, Daily   D-Mannose 500 MG CAPS Oral, Daily   DULoxetine (CYMBALTA) 30 mg, Oral, 3 times daily   ELIQUIS 5 MG TABS tablet TAKE 1 TABLET BY MOUTH TWICE A DAY   furosemide (LASIX) 20 MG tablet TAKE 1 TABLET BY MOUTH EVERY DAY   levETIRAcetam (KEPPRA) 500 mg, Oral, 2 times daily   levothyroxine (SYNTHROID) 50 MCG tablet TAKE 1 TABLET BY MOUTH EVERY DAY BEFORE BREAKFAST   mirtazapine (REMERON) 7.5 mg, Oral, Daily at bedtime   Multiple Vitamins-Minerals (MULTIVITAMIN PO) 1 tablet, Oral, Daily   oxybutynin (DITROPAN-XL) 10 mg, Oral, Daily   oxyCODONE-acetaminophen (PERCOCET) 5-325 MG tablet 1 tablet, Oral, Every 4 hours PRN   potassium chloride SA (KLOR-CON M20) 20 MEQ tablet 20 mEq, Oral, Daily   Probiotic Product (PROBIOTIC DAILY PO) 1 tablet, Oral, Daily, OTC   senna-docusate (SENOKOT-S) 8.6-50 MG tablet 1 tablet, Oral, 2 times daily PRN   traZODone (DESYREL) 100 mg, Oral, Daily at bedtime   vitamin B-12 (CYANOCOBALAMIN) 500 mcg, Oral, Daily     VITALS:  There were no vitals filed for this visit. Depression screen [facility] 2/9 12/23/2020 11/25/2020 07/22/2020 12/04/2019 07/03/2019  Decreased Interest 0 0 0 0 0  Down, Depressed, Hopeless 0 0 0 0 0  PHQ - 2 Score 0 0 0 0 0  Some recent data might be hidden    PHYSICAL EXAM   HEENT:  Normocephalic, atraumatic. Andrea mucous membranes are moist. Andrea superficial temporal arteries are without ropiness or tenderness. Cardiovascular: Regular rate and rhythm. Lungs: Clear to auscultation bilaterally. Neck: There are no carotid bruits noted  bilaterally.  NEUROLOGICAL: No flowsheet data found. MMSE - Mini Mental State Exam 09/29/2021 12/10/2018 12/03/2017  Orientation to time 1 5 5   Orientation to Place 2 4 4   Registration 2 3 3   Attention/ Calculation 0 4 5  Recall 2 2 1   Language- name 2 objects 2 2 2   Language- repeat 1 1 1   Language- follow 3 step command 2 2 3   Language- read & follow direction 1 1 1   Write a sentence 0 1 1  Copy design 0 0 1  Total score 13 25 27     No flowsheet data found.   Orientation:  Alert and oriented to person, not to place and time. No aphasia or dysarthria. Fund of knowledge is reduced.  Recent  and remote memory intact.  Attention and concentration  are reduced  Able to name objects and repeat phrases.   Cranial nerves: There is good facial symmetry. Extraocular muscles are intact and visual fields are full to confrontational testing. Speech is fluent and clear. Soft palate rises symmetrically and there is no tongue deviation. Hearing is intact to conversational tone. Tone: Tone is good throughout. Sensation: Sensation is intact to light touch and pinprick throughout. Vibration is intact at Andrea bilateral big toe.There is no extinction with double simultaneous stimulation. There is no sensory dermatomal level identified. Coordination: Andrea [name] has no difficulty with RAM's or FNF bilaterally. Normal finger to nose  Motor: Strength is 5/5 in Andrea bilateral upper and lower extremities. There is no pronator drift. There are no fasciculations noted. DTR's: Deep tendon reflexes are 2/4 at Andrea bilateral biceps, triceps, brachioradialis, patella and achilles.  Plantar responses are downgoing bilaterally. Gait and Station: Andrea [name] is in a wheelchair, gait has not been tested.  Thank you for allowing [place] Andrea opportunity to participate in Andrea care of this nice [name]. Please do not hesitate to contact us for any questions or concerns.   Total time spent on today's visit was 60 minutes, including  both face-to-face time and nonface-to-face time.  Time included that spent on review of records (prior notes available to me/labs/imaging if pertinent), discussing treatment and goals, answering [name]'s questions and coordinating care.  Cc:  Ngetich, Nelda Bucks, NP  Sharene Butters 10/16/2021 5:17 PM

## 2021-10-17 ENCOUNTER — Encounter: Payer: Self-pay | Admitting: Physician Assistant

## 2021-10-17 ENCOUNTER — Ambulatory Visit (INDEPENDENT_AMBULATORY_CARE_PROVIDER_SITE_OTHER): Payer: Medicare Other | Admitting: Physician Assistant

## 2021-10-17 ENCOUNTER — Other Ambulatory Visit: Payer: Self-pay

## 2021-10-17 VITALS — BP 111/70 | HR 73 | Resp 20 | Ht 62.0 in | Wt 200.0 lb

## 2021-10-17 DIAGNOSIS — R413 Other amnesia: Secondary | ICD-10-CM | POA: Diagnosis not present

## 2021-10-17 DIAGNOSIS — F0390 Unspecified dementia without behavioral disturbance: Secondary | ICD-10-CM

## 2021-10-17 NOTE — Patient Instructions (Addendum)
It was a pleasure to see you today at our office.   Recommendations:  Follow up in  3 months MRI brain  Will entertain starting on Aricept pending on the results of the MRI , if so , willStart Donepezil 10 mg :Take half tablet (5 mg) daily for 2 weeks, then increase to the full tablet at 10 mg daily. Side effects discussed     RECOMMENDATIONS FOR ALL PATIENTS WITH MEMORY PROBLEMS: 1. Continue to exercise (Recommend 30 minutes of walking everyday, or 3 hours every week) 2. Increase social interactions - continue going to [place] and enjoy social gatherings with friends and family 3. Eat healthy, avoid fried foods and eat more fruits and vegetables 4. Maintain adequate blood pressure, blood sugar, and blood cholesterol level. Reducing the risk of stroke and cardiovascular disease also helps promoting better memory. 5. Avoid stressful situations. Live a simple life and avoid aggravations. Organize your time and prepare for the next day in anticipation. 6. Sleep well, avoid any interruptions of sleep and avoid any distractions in the bedroom that may interfere with adequate sleep quality 7. Avoid sugar, avoid sweets as there is a strong link between excessive sugar intake, diabetes, and cognitive impairment We discussed the Mediterranean diet, which has been shown to help patients reduce the risk of progressive memory disorders and reduces cardiovascular risk. This includes eating fish, eat fruits and green leafy vegetables, nuts like almonds and hazelnuts, walnuts, and also use olive oil. Avoid fast foods and fried foods as much as possible. Avoid sweets and sugar as sugar use has been linked to worsening of memory function.  There is always a concern of gradual progression of memory problems. If this is the case, then we may need to adjust level of care according to patient needs. Support, both to the patient and caregiver, should then be put into place.    FALL PRECAUTIONS: Be cautious when  walking. Scan the area for obstacles that may increase the risk of trips and falls. When getting up in the mornings, sit up at the edge of the bed for a few minutes before getting out of bed. Consider elevating the bed at the head end to avoid drop of blood pressure when getting up. Walk always in a well-lit room (use night lights in the walls). Avoid area rugs or power cords from appliances in the middle of the walkways. Use a walker or a cane if necessary and consider physical therapy for balance exercise. Get your eyesight checked regularly.  FINANCIAL OVERSIGHT: Supervision, especially oversight when making financial decisions or transactions is also recommended.  HOME SAFETY: Consider the safety of the kitchen when operating appliances like stoves, microwave oven, and blender. Consider having supervision and share cooking responsibilities until no longer able to participate in those. Accidents with firearms and other hazards in the house should be identified and addressed as well.   ABILITY TO BE LEFT ALONE: If patient is unable to contact 911 operator, consider using LifeLine, or when the need is there, arrange for someone to stay with patients. Smoking is a fire hazard, consider supervision or cessation. Risk of wandering should be assessed by caregiver and if detected at any point, supervision and safe proof recommendations should be instituted.  MEDICATION SUPERVISION: Inability to self-administer medication needs to be constantly addressed. Implement a mechanism to ensure safe administration of the medications.   DRIVING: Regarding driving, in patients with progressive memory problems, driving will be impaired. We advise to have someone else do  the driving if trouble finding directions or if minor accidents are reported. Independent driving assessment is available to determine safety of driving.   If you are interested in the driving assessment, you can contact the following:  [employer]  [employer] in [place]  [place] [phone]  [facility] [phone] or [phone]  We have sent a referral to [facility] for your MRI and they will call you directly to schedule your appointment. They are located at [place]. If you need to contact them directly please call [phone].

## 2021-10-21 DIAGNOSIS — L603 Nail dystrophy: Secondary | ICD-10-CM | POA: Diagnosis not present

## 2021-10-21 DIAGNOSIS — I739 Peripheral vascular disease, unspecified: Secondary | ICD-10-CM | POA: Diagnosis not present

## 2021-10-27 ENCOUNTER — Telehealth: Payer: Self-pay | Admitting: *Deleted

## 2021-10-27 ENCOUNTER — Telehealth: Payer: Self-pay | Admitting: Physician Assistant

## 2021-10-27 DIAGNOSIS — Z20822 Contact with and (suspected) exposure to covid-19: Secondary | ICD-10-CM | POA: Diagnosis not present

## 2021-10-27 NOTE — Telephone Encounter (Signed)
Patient daughter dropped off Handicap Placard for Andrea [name] to review and fill out.  Placed in Andrea [name]'s folder for signature.  To call daughter, Andrea [name] once signed and ready for pickup [phone]

## 2021-10-27 NOTE — Telephone Encounter (Signed)
Patient's caregiver, Jocelyn Lamer, called and said they have not heard anything about scheduling a brain scan.  She also said the patient has gotten much worse with her memory over the last week and she is concerned.

## 2021-10-27 NOTE — Telephone Encounter (Signed)
Will call patient to contact [place] at [phone]

## 2021-10-28 DIAGNOSIS — N3946 Mixed incontinence: Secondary | ICD-10-CM | POA: Diagnosis not present

## 2021-10-28 DIAGNOSIS — Z20822 Contact with and (suspected) exposure to covid-19: Secondary | ICD-10-CM | POA: Diagnosis not present

## 2021-10-28 NOTE — Telephone Encounter (Signed)
Tammi Klippel, daughter notified that form is ready for pick up. Placed in drawer up front.  Copy sent to scanning.

## 2021-10-31 DIAGNOSIS — F341 Dysthymic disorder: Secondary | ICD-10-CM | POA: Diagnosis not present

## 2021-10-31 DIAGNOSIS — F332 Major depressive disorder, recurrent severe without psychotic features: Secondary | ICD-10-CM | POA: Diagnosis not present

## 2021-10-31 DIAGNOSIS — F411 Generalized anxiety disorder: Secondary | ICD-10-CM | POA: Diagnosis not present

## 2021-11-02 ENCOUNTER — Other Ambulatory Visit: Payer: Self-pay

## 2021-11-02 DIAGNOSIS — Z8781 Personal history of (healed) traumatic fracture: Secondary | ICD-10-CM

## 2021-11-02 MED ORDER — OXYCODONE-ACETAMINOPHEN 5-325 MG PO TABS: 1.0000 | ORAL_TABLET | ORAL | 0 refills | Status: DC | PRN

## 2021-11-02 NOTE — Telephone Encounter (Signed)
Incoming call from patients daughter requesting refill on Oxycodone.   RX last refilled on 08/03/2021  Treatment agreement on file from 08/03/21

## 2021-11-11 ENCOUNTER — Other Ambulatory Visit: Payer: Self-pay

## 2021-11-11 ENCOUNTER — Ambulatory Visit
Admission: RE | Admit: 2021-11-11 | Discharge: 2021-11-11 | Disposition: A | Discharge status: Home / Self Care | Payer: Medicare Other | Source: Ambulatory Visit | Attending: Physician Assistant | Admitting: Physician Assistant

## 2021-11-11 DIAGNOSIS — I6782 Cerebral ischemia: Secondary | ICD-10-CM | POA: Diagnosis not present

## 2021-11-11 DIAGNOSIS — G319 Degenerative disease of nervous system, unspecified: Secondary | ICD-10-CM | POA: Diagnosis not present

## 2021-11-11 DIAGNOSIS — Z981 Arthrodesis status: Secondary | ICD-10-CM | POA: Diagnosis not present

## 2021-11-11 DIAGNOSIS — R413 Other amnesia: Secondary | ICD-10-CM | POA: Diagnosis not present

## 2021-11-15 ENCOUNTER — Other Ambulatory Visit: Payer: Self-pay | Admitting: Physician Assistant

## 2021-11-15 MED ORDER — DONEPEZIL HCL 10 MG PO TABS: ORAL_TABLET | ORAL | 11 refills | Status: DC

## 2021-11-16 ENCOUNTER — Telehealth: Payer: Self-pay | Admitting: Physician Assistant

## 2021-11-16 NOTE — Telephone Encounter (Signed)
Called daughter back and let her know prescription has been sent in to the [facility] on [address] and went over dose and frequency with patients daughter. They had no further questions at this time

## 2021-11-16 NOTE — Telephone Encounter (Signed)
Bobbi, [name]'s daughter is returning a call to someone

## 2021-11-24 ENCOUNTER — Other Ambulatory Visit: Payer: Self-pay

## 2021-11-24 ENCOUNTER — Encounter: Payer: Self-pay | Admitting: Family

## 2021-11-24 ENCOUNTER — Ambulatory Visit (INDEPENDENT_AMBULATORY_CARE_PROVIDER_SITE_OTHER): Payer: Medicare Other | Admitting: Family

## 2021-11-24 VITALS — BP 120/70 | HR 67 | Temp 97.3°F | Resp 16 | Ht 62.0 in | Wt 209.4 lb

## 2021-11-24 DIAGNOSIS — N3281 Overactive bladder: Secondary | ICD-10-CM | POA: Diagnosis not present

## 2021-11-24 DIAGNOSIS — R569 Unspecified convulsions: Secondary | ICD-10-CM | POA: Diagnosis not present

## 2021-11-24 DIAGNOSIS — F309 Manic episode, unspecified: Secondary | ICD-10-CM

## 2021-11-24 DIAGNOSIS — F341 Dysthymic disorder: Secondary | ICD-10-CM | POA: Diagnosis not present

## 2021-11-24 DIAGNOSIS — I480 Paroxysmal atrial fibrillation: Secondary | ICD-10-CM | POA: Diagnosis not present

## 2021-11-24 DIAGNOSIS — E1169 Type 2 diabetes mellitus with other specified complication: Secondary | ICD-10-CM | POA: Diagnosis not present

## 2021-11-24 DIAGNOSIS — E039 Hypothyroidism, unspecified: Secondary | ICD-10-CM | POA: Diagnosis not present

## 2021-11-24 NOTE — Progress Notes (Signed)
Provider: Marlowe Sax FNP-C   Dannon Nguyenthi, Nelda Bucks, NP  Patient Care Team: Nalda Shackleford, Nelda Bucks, NP as PCP - General (Family Medicine) Tanda Rockers, MD as Consulting Physician (Pulmonary Disease) Deveron Furlong, NP as Nurse Practitioner (Psychiatry)  Extended Emergency Contact Information Primary Emergency Contact: Lessley,Roberta(Bobbi) Address: [address]          [place], [place] [address] [name] Phone: [phone] Mobile Phone: [phone] Relation: Daughter Secondary Emergency Contact: Swofford,David Address: [address]           [place], [place] [address] [place] Phone: [phone] Work Phone: [phone] Relation: Son  Code Status:  DNR Goals of care: Advanced Directive information Advanced Directives 11/24/2021  Does Patient Have a Medical Advance Directive? Yes  Type of Advance Directive Out of facility DNR (pink MOST or yellow form)  Does patient want to make changes to medical advance directive? No - Patient declined  Copy of [place] in Chart? -  Would patient like information on creating a medical advance directive? -  Pre-existing out of facility DNR order (yellow form or pink MOST form) -     Chief Complaint  Patient presents with   Medical Management of Chronic Issues    6 month follow up   Health Maintenance    Discuss the need for Eye exam, Hemoglobin A1C, Foot exam, and Urine Microalbumin.   Immunizations    Discuss the need for Shingrix vaccine, Covid Booster, and Influenza vaccine.    HPI:  Pt is a 87 y.o. female seen today for 6 months follow up for medical management of chronic diseases.she is here with care giver.States memory continue to decline no change with Aricept.follows up with Neurologist.MRI done 11/11/2021 showed advance chronic small vessel ischemic changes within cerebral white matter progressed compared to previous MRI.also mild -moderate generalized cerebral atrophy  with mild cerebellar atrophy. She was advised to follow up in 3 months with Neurologist.  States does not know why she is here. Care giver states usually takes a nap after breakfast and afternoon.  Appetite is good eats everything that is set in front of her.Has had 9 lbs weight gain over past 6 months. Right knee pain under controlled with current pain medication.  Reports no recent fall episode States was seen in October ,2022 [facility]  Continues to follow up with [facility] giver states has completed required COVID-19 booster vaccine and influenza vaccine     Past Medical History:  Diagnosis Date   A-fib ([place])    Anemia    Anxiety    Arthritis    hands, spine    Bladder incontinence    Blood transfusion    Cancer ([place]) 02/2019   left breast cancer   Cervicalgia    Constipation    Cough    Wert-onset 08/2009, as of 2014- resolved    Deep venous thrombosis ([place])    post op, rec'd/needed  blood thinner    Depression    Depression with anxiety    Dysrhythmia    a-fib   Falls frequently    pt. reports that she was falling 3 times a day, has had PT at a facility for a while & now is getting home PT 2-3 times/ week    Headache(784.0) 09/27/2011   History of stress test    15-20 yrs.ago    Hypotension    Hypothyroidism    Lumbar radiculopathy 09/26/2011  Pneumonia    never been in hosp. for pneumonia    Prediabetes    Right hand fracture    Monday   Shortness of breath    Sleep apnea    does not use consistently   Past Surgical History:  Procedure Laterality Date   ANTERIOR CERVICAL DECOMP/DISCECTOMY FUSION N/A 07/09/2013   Procedure: ANTERIOR CERVICAL DECOMPRESSION/DISCECTOMY FUSION 3 LEVELS;  Surgeon: Sinclair Ship, MD;  Location: [facility];  Service: Orthopedics;  Laterality: N/A;  Anterior cervical decompression fusion, cervical 3-4, cervical 4-5-, cervical 5-6 with instrumentation, allograft.   APPENDECTOMY     BACK SURGERY     2000   BREAST  EXCISIONAL BIOPSY     breast bxs on left x 2, breast bx of right x 1-all benign   EYE SURGERY     cataracts bilateral /w IOL   HERNIA REPAIR     umbilical hernia- 1660   IR IMAGING GUIDED PORT INSERTION  04/28/2019   JOINT REPLACEMENT  2006,2007   bilateral   LAPAROSCOPIC OVARIAN CYSTECTOMY     not done by laparscopy-abdominal incision   LEG SURGERY     ORIF PERIPROSTHETIC FRACTURE Right 07/26/2019   Procedure: OPEN REDUCTION INTERNAL FIXATION (ORIF) RIGHT FEMUR PERIPROSTHETIC FRACTURE;  Surgeon: Rod Can, MD;  Location: [facility];  Service: Orthopedics;  Laterality: Right;   PORTACATH PLACEMENT Right 04/01/2019   Procedure: ATTEMPTED INSERTION PORT-A-CATH WITH ULTRASOUND;  Surgeon: [name], MD;  Location: [facility];  Service: General;  Laterality: Right;   RADIOACTIVE SEED GUIDED PARTIAL MASTECTOMY WITH AXILLARY SENTINEL LYMPH NODE BIOPSY Left 04/01/2019   Procedure: LEFT BREAST PARTIAL MASTECTOMY WITH RADIOACTIVE SEED AND LEFT SENTINEL LYMPH NODE BIOPSY;  Surgeon: [name], MD;  Location: [facility];  Service: General;  Laterality: Left;   RE-EXCISION OF BREAST CANCER,SUPERIOR MARGINS Left 04/15/2019   Procedure: RE-EXCISION OF LEFT BREAST CANCER POSITIVE MARGINS;  Surgeon: [name], MD;  Location: [facility];  Service: General;  Laterality: Left;   REPLACEMENT TOTAL KNEE BILATERAL     TONSILLECTOMY      Allergies  Allergen Reactions   Clindamycin Hcl Other (See Comments)    REACTION: swelling, pt. Reports that she had a 16 lb. weightgain in one day    Allergies as of 11/24/2021       Reactions   Clindamycin Hcl Other (See Comments)   REACTION: swelling, pt. Reports that she had a 16 lb. weightgain in one day        Medication List        Accurate as of November 24, 2021  9:37 AM. If you have any questions, ask your nurse or doctor.          STOP taking these medications    ciprofloxacin 500 MG  tablet Commonly known as: Cipro Stopped by: Sandrea Hughs, NP   levETIRAcetam 500 MG tablet Commonly known as: Keppra Stopped by: Sandrea Hughs, NP       TAKE these medications    albuterol 108 (90 Base) MCG/ACT inhaler Commonly known as: VENTOLIN HFA Inhale 2 puffs into the lungs every 6 (six) hours as needed for wheezing or shortness of breath.   brexpiprazole 1 MG Tabs tablet Commonly known as: REXULTI Take 1 mg by mouth at bedtime.   busPIRone 5 MG tablet Commonly known as: BUSPAR Take 5-10 mg by mouth 2 (two) times daily. 10 MG IN THE MORNING, 5 MG AT DINNER   CALCIUM CITRATE PO Take  600 mg by mouth daily.   clonazePAM 0.5 MG tablet Commonly known as: KLONOPIN Take 1 tablet by mouth as needed in middle of the nite to go back to sleep   CRANBERRY PO Take 2 tablets by mouth daily.   D-Mannose 500 MG Caps Take by mouth daily.   donepezil 10 MG tablet Commonly known as: ARICEPT Take half tablet (5 mg) daily for 2 weeks, then increase to the full tablet at 10 mg daily   DULoxetine 30 MG capsule Commonly known as: CYMBALTA Take 30 mg by mouth in the morning, at noon, and at bedtime.   Eliquis 5 MG Tabs tablet Generic drug: apixaban TAKE 1 TABLET BY MOUTH TWICE A DAY   furosemide 20 MG tablet Commonly known as: LASIX TAKE 1 TABLET BY MOUTH EVERY DAY   levothyroxine 50 MCG tablet Commonly known as: SYNTHROID TAKE 1 TABLET BY MOUTH EVERY DAY BEFORE BREAKFAST   mirtazapine 15 MG tablet Commonly known as: REMERON Take 7.5 mg by mouth at bedtime.   MULTIVITAMIN PO Take 1 tablet by mouth daily.   oxybutynin 10 MG 24 hr tablet Commonly known as: DITROPAN-XL Take 10 mg by mouth daily.   oxyCODONE-acetaminophen 5-325 MG tablet Commonly known as: Percocet Take 1 tablet by mouth every 4 (four) hours as needed for severe pain.   potassium chloride SA 20 MEQ tablet Commonly known as: Klor-Con M20 Take 1 tablet (20 mEq total) by mouth daily.    PROBIOTIC DAILY PO Take 1 tablet by mouth daily. OTC   senna-docusate 8.6-50 MG tablet Commonly known as: Senokot-S Take 1 tablet by mouth 2 (two) times daily as needed for mild constipation.   traZODone 100 MG tablet Commonly known as: DESYREL Take 100 mg by mouth at bedtime.   vitamin B-12 500 MCG tablet Commonly known as: CYANOCOBALAMIN Take 500 mcg by mouth daily.        Review of Systems  Constitutional:  Negative for appetite change, chills, fatigue, fever and unexpected weight change.  HENT:  Negative for congestion, dental problem, ear discharge, ear pain, facial swelling, hearing loss, nosebleeds, postnasal drip, rhinorrhea, sinus pressure, sinus pain, sneezing, sore throat, tinnitus and trouble swallowing.   Eyes:  Positive for visual disturbance. Negative for pain, discharge, redness and itching.       Wear eye glasses follows [facility]   Respiratory:  Negative for cough, chest tightness, shortness of breath and wheezing.   Cardiovascular:  Negative for chest pain, palpitations and leg swelling.  Gastrointestinal:  Negative for abdominal distention, abdominal pain, blood in stool, constipation, diarrhea, nausea and vomiting.  Endocrine: Negative for cold intolerance, heat intolerance, polydipsia, polyphagia and polyuria.  Genitourinary:  Negative for difficulty urinating, dysuria, flank pain and urgency.       OAB wears adult size pull ups   Musculoskeletal:  Positive for arthralgias and gait problem. Negative for back pain, joint swelling, myalgias, neck pain and neck stiffness.  Skin:  Negative for color change, pallor, rash and wound.  Neurological:  Negative for dizziness, syncope, speech difficulty, weakness, light-headedness, numbness and headaches.  Hematological:  Does not bruise/bleed easily.  Psychiatric/Behavioral:  Negative for agitation, behavioral problems, confusion, hallucinations, self-injury, sleep disturbance and suicidal ideas. The patient is  not nervous/anxious.    Immunization History  Administered Date(s) Administered   Fluad Quad(high Dose 65+) 07/22/2020   Influenza Split 08/14/2013   Influenza Whole 08/20/2009   Influenza, High Dose Seasonal PF 09/11/2017, 07/25/2018   Influenza,inj,Quad PF,6+ Mos 08/04/2014,  09/06/2015, 07/10/2016   PFIZER Comirnaty(Gray Top)Covid-19 Tri-Sucrose Vaccine 02/23/2021   PFIZER(Purple Top)SARS-COV-2 Vaccination 12/27/2019, 01/17/2020, 08/30/2020   Pneumococcal Conjugate-13 01/14/2015   Pneumococcal Polysaccharide-23 07/11/2013   Td 11/20/2005, 11/27/2016   Pertinent  Health Maintenance Due  Topic Date Due   OPHTHALMOLOGY EXAM  02/16/2021   HEMOGLOBIN A1C  05/25/2021   INFLUENZA VACCINE  06/20/2021   FOOT EXAM  07/22/2021   URINE MICROALBUMIN  11/26/2021   DEXA SCAN  Completed   Fall Risk 03/25/2021 05/24/2021 09/29/2021 10/17/2021 11/24/2021  Falls in the past year? 0 0 0 0 0  Number of falls in past year - - - - -  Was there an injury with Fall? 0 0 0 0 0  Was there an injury with Fall? - - - - -  Fall Risk Category Calculator 0 0 0 0 0  Fall Risk Category _0   Patient Fall Risk Level Low fall risk Low fall risk Low fall risk High fall risk Low fall risk  Patient at Risk for Falls Due to - No Fall Risks No Fall Risks - No Fall Risks  Fall risk Follow up - Falls evaluation completed Falls evaluation completed - Falls evaluation completed   Functional Status Survey:    Vitals:   11/24/21 0929  BP: 120/70  Pulse: 67  Resp: 16  Temp: (!) 97.3 F (36.3 C)  SpO2: 92%  Weight: 209 lb 6.4 oz (95 kg)  Height: _1  (1.575 m)   Body mass index is 38.3 kg/m. Physical Exam Vitals reviewed.  Constitutional:      General: She is not in acute distress.    Appearance: Normal appearance. She is obese. She is not ill-appearing or diaphoretic.  HENT:     Head: Normocephalic.     Right Ear: Tympanic membrane, ear canal and external ear normal. There is no impacted  cerumen.     Left Ear: Tympanic membrane, ear canal and external ear normal. There is no impacted cerumen.     Ears:     Comments: Bilateral hearing aids in place     Nose: Nose normal. No congestion or rhinorrhea.     Mouth/Throat:     Mouth: Mucous membranes are moist.     Pharynx: Oropharynx is clear. No oropharyngeal exudate or posterior oropharyngeal erythema.  Eyes:     General: No scleral icterus.       Right eye: No discharge.        Left eye: No discharge.     Extraocular Movements: Extraocular movements intact.     Conjunctiva/sclera: Conjunctivae normal.     Pupils: Pupils are equal, round, and reactive to light.  Neck:     Vascular: No carotid bruit.  Cardiovascular:     Rate and Rhythm: Normal rate and regular rhythm.     Pulses: Normal pulses.     Heart sounds: Normal heart sounds. No murmur heard.   No friction rub. No gallop.  Pulmonary:     Effort: Pulmonary effort is normal. No respiratory distress.     Breath sounds: Normal breath sounds. No wheezing, rhonchi or rales.  Chest:     Chest wall: No tenderness.  Abdominal:     General: Bowel sounds are normal. There is no distension.     Palpations: Abdomen is soft. There is no mass.     Tenderness: There is no abdominal tenderness. There is no right CVA tenderness, left CVA tenderness, guarding or rebound.  Musculoskeletal:  General: No swelling or tenderness.     Cervical back: Normal range of motion. No rigidity or tenderness.     Right lower leg: No edema.     Left lower leg: No edema.     Comments: Unsteady gait walks short distance with walker.Has wheelchair assisted by care giver   Lymphadenopathy:     Cervical: No cervical adenopathy.  Skin:    General: Skin is warm and dry.     Coloration: Skin is not pale.     Findings: No bruising, erythema, lesion or rash.  Neurological:     Mental Status: She is alert. Mental status is at baseline.     Cranial Nerves: No cranial nerve deficit.      Sensory: No sensory deficit.     Motor: No weakness.     Coordination: Coordination normal.     Gait: Gait abnormal.  Psychiatric:        Mood and Affect: Mood normal.        Speech: Speech normal.        Behavior: Behavior normal.        Thought Content: Thought content normal.        Cognition and Memory: Memory is impaired.        Judgment: Judgment normal.    Labs reviewed: Recent Labs    11/25/20 1220 01/20/21 0000 05/24/21 1149  NA 139 138 139  K 4.2 4.2 4.6  CL 102 101 102  CO2 _0 GLUCOSE 101 98 96  BUN 28* 24 20  CREATININE 0.90* 0.94* 0.93*  CALCIUM 9.6 8.9 9.5   Recent Labs    11/25/20 1220 01/20/21 0000 05/24/21 1149  AST _1 ALT _2 BILITOT 0.3 0.3 0.4  PROT 6.3 6.3 6.2   Recent Labs    11/25/20 1220 01/20/21 0000 05/24/21 1149  WBC 5.9 6.0 6.6  NEUTROABS 4,219 4,092 4,706  HGB 11.2* 10.7* 11.4*  HCT 35.0 33.3* 37.0  MCV 80.8 82.0 83.5  PLT 289 307 302   Lab Results  Component Value Date   TSH 1.26 05/24/2021   Lab Results  Component Value Date   HGBA1C 5.3 11/25/2020   Lab Results  Component Value Date   CHOL 172 10/16/2018   HDL 56 10/16/2018   LDLCALC 97 10/16/2018   TRIG 98 10/16/2018   CHOLHDL 3.1 10/16/2018    Significant Diagnostic Results in last 30 days:  MR BRAIN WO CONTRAST  Result Date: 11/11/2021 CLINICAL DATA:  Provided history: Memory loss. EXAM: MRI HEAD WITHOUT CONTRAST TECHNIQUE: Multiplanar, multiecho pulse sequences of the brain and surrounding structures were obtained without intravenous contrast. COMPARISON:  Prior head CT examinations 07/29/2019 and earlier. Brain MRI 09/25/2011. FINDINGS: Brain: Mild intermittent motion degradation. Mild-to-moderate generalized cerebral atrophy. Comparatively mild cerebellar atrophy. Commensurate prominence of the ventricles and sulci. Advanced patchy and confluent T2 FLAIR hyperintense signal abnormality within the cerebral white matter, nonspecific but  compatible with chronic small vessel ischemic disease. There is no acute infarct. No evidence of an intracranial mass. No chronic intracranial blood products. No extra-axial fluid collection. No midline shift. Vascular: Maintained flow voids within the proximal large arterial vessels. Skull and upper cervical spine: No focal suspicious marrow lesion. Susceptibility artifact arising from cervical spinal fusion hardware. Sinuses/Orbits: Visualized orbits show no acute finding. Bilateral lens replacements. Minimal mucosal thickening within the bilateral ethmoid sinuses. Other: Trace fluid within the bilateral mastoid air cells. IMPRESSION: No evidence of acute intracranial  abnormality. Advanced chronic small vessel ischemic changes within the cerebral white matter, progressed from the brain MRI of 09/25/2011. Mild-to-moderate generalized cerebral atrophy, with comparatively mild cerebellar atrophy, also progressed. Minimal bilateral ethmoid sinus mucosal thickening. Trace fluid within the bilateral mastoid air cells. Electronically Signed   By: Kellie Simmering D.O.   On: 11/11/2021 17:23    Assessment/Plan  1. Type 2 diabetes mellitus with other specified complication, without long-term current use of insulin (HCC) Lab Results  Component Value Date   HGBA1C 5.3 11/25/2020  Diet controlled  - Microalbumin / creatinine urine ratio - Hemoglobin A1c - CBC with Differential/Platelet; Future - CMP with eGFR([facility]); Future - TSH; Future - Lipid panel; Future  2. Acquired hypothyroidism Lab Results  Component Value Date   TSH 1.26 05/24/2021  Continue on levothyroxine  - TSH; Future  3. Paroxysmal A-fib (HCC) HR controlled  Continue on Eliquis  Continue to follow up with Cardiologist  No signs of bleeding reported  - CBC with Differential/Platelet; Future  4. Seizures ([place]) No recent seizure reported  Off keppra  - continue to monitor   5. Overactive bladder Chronic  Continue on oxybutynin.Not  recommended for her age but has been on it for several years and has been effective for her.  6. DEPRESSION/ANXIETY Mood stable  Continue on Duloxetine, remeron and Clonazepam  - TSH; Future  7. Bipolar I disorder, single manic episode ([place]) Mood stable  Continue current medication   Family/ staff Communication: Reviewed plan of care with patient and care giver verbalized understanding.   Labs/tests ordered:  - CBC with Differential/Platelet - CMP with eGFR([facility]) - TSH - Hgb A1C - Lipid panel - urine microAlbumin   Next Appointment : 6 months for medical management of chronic issues.Fasting Labs prior to visit.    Sandrea Hughs, NP

## 2021-11-25 ENCOUNTER — Other Ambulatory Visit (INDEPENDENT_AMBULATORY_CARE_PROVIDER_SITE_OTHER): Payer: Medicare Other

## 2021-11-25 ENCOUNTER — Other Ambulatory Visit: Payer: Self-pay

## 2021-11-25 ENCOUNTER — Telehealth: Payer: Self-pay | Admitting: Physician Assistant

## 2021-11-25 DIAGNOSIS — R413 Other amnesia: Secondary | ICD-10-CM

## 2021-11-25 DIAGNOSIS — E871 Hypo-osmolality and hyponatremia: Secondary | ICD-10-CM

## 2021-11-25 DIAGNOSIS — R4182 Altered mental status, unspecified: Secondary | ICD-10-CM

## 2021-11-25 LAB — MICROALBUMIN / CREATININE URINE RATIO
Creatinine, Urine: 93 mg/dL (ref 20–275)
Microalb Creat Ratio: 14 mcg/mg creat (ref <30)
Microalb, Ur: 1.3 mg/dL

## 2021-11-25 LAB — COMPREHENSIVE METABOLIC PANEL WITH GFR
ALT: 8 U/L (ref 0–35)
AST: 14 U/L (ref 0–37)
Albumin: 3.7 g/dL (ref 3.5–5.2)
Alkaline Phosphatase: 63 U/L (ref 39–117)
BUN: 26 mg/dL — ABNORMAL HIGH (ref 6–23)
CO2: 26 meq/L (ref 19–32)
Calcium: 9.1 mg/dL (ref 8.4–10.5)
Chloride: 100 meq/L (ref 96–112)
Creatinine, Ser: 1.14 mg/dL (ref 0.40–1.20)
GFR: 44.23 mL/min — ABNORMAL LOW
Glucose, Bld: 126 mg/dL — ABNORMAL HIGH (ref 70–99)
Potassium: 4.3 meq/L (ref 3.5–5.1)
Sodium: 136 meq/L (ref 135–145)
Total Bilirubin: 0.3 mg/dL (ref 0.2–1.2)
Total Protein: 6.2 g/dL (ref 6.0–8.3)

## 2021-11-25 LAB — HEMOGLOBIN A1C
Hgb A1c MFr Bld: 5.2 % of total Hgb (ref <5.7)
Mean Plasma Glucose: 103 mg/dL
eAG (mmol/L): 5.7 mmol/L

## 2021-11-25 NOTE — Telephone Encounter (Signed)
Also needs cmp done for labs per Clarise Cruz

## 2021-11-25 NOTE — Telephone Encounter (Signed)
Pt c/o: possible seizure Missed medications?  Not on any for this Sleep deprived?  yes Alcohol intake?  No Back to their usual baseline self?  YES Current medications none for seizures   Eyes rolled back like for a few seconds, back to normal today. Please advise.

## 2021-11-25 NOTE — Telephone Encounter (Signed)
Patient's daughter Andrea [name] called wanting Andrea [name] advice on jerking her mom experienced on the commode that was seizure-like.  Patient has not been seen for this previously at the office.  She said she happened before about a year ago.

## 2021-11-25 NOTE — Telephone Encounter (Signed)
Advised of labs, patient will shedule EEG appt when she comes in for labs today for the next opening slot

## 2021-11-28 NOTE — Telephone Encounter (Signed)
Left message to call office at 1055 11/28/2021

## 2021-11-28 NOTE — Telephone Encounter (Signed)
Notified of normal labs, to follow up as scheduled.

## 2021-12-19 DIAGNOSIS — F341 Dysthymic disorder: Secondary | ICD-10-CM | POA: Diagnosis not present

## 2021-12-19 DIAGNOSIS — F411 Generalized anxiety disorder: Secondary | ICD-10-CM | POA: Diagnosis not present

## 2021-12-19 DIAGNOSIS — F332 Major depressive disorder, recurrent severe without psychotic features: Secondary | ICD-10-CM | POA: Diagnosis not present

## 2021-12-22 ENCOUNTER — Ambulatory Visit (INDEPENDENT_AMBULATORY_CARE_PROVIDER_SITE_OTHER): Payer: Medicare Other | Admitting: Neurology

## 2021-12-22 ENCOUNTER — Other Ambulatory Visit: Payer: Self-pay

## 2021-12-22 DIAGNOSIS — R4182 Altered mental status, unspecified: Secondary | ICD-10-CM | POA: Diagnosis not present

## 2021-12-27 ENCOUNTER — Encounter: Payer: Self-pay | Admitting: Nurse Practitioner

## 2021-12-27 ENCOUNTER — Ambulatory Visit (INDEPENDENT_AMBULATORY_CARE_PROVIDER_SITE_OTHER): Payer: Medicare Other | Admitting: Nurse Practitioner

## 2021-12-27 ENCOUNTER — Other Ambulatory Visit: Payer: Self-pay

## 2021-12-27 ENCOUNTER — Telehealth: Payer: Self-pay

## 2021-12-27 DIAGNOSIS — R269 Unspecified abnormalities of gait and mobility: Secondary | ICD-10-CM | POA: Diagnosis not present

## 2021-12-27 DIAGNOSIS — Z0000 Encounter for general adult medical examination without abnormal findings: Secondary | ICD-10-CM | POA: Diagnosis not present

## 2021-12-27 DIAGNOSIS — R29898 Other symptoms and signs involving the musculoskeletal system: Secondary | ICD-10-CM | POA: Diagnosis not present

## 2021-12-27 NOTE — Progress Notes (Signed)
Subjective:   Andrea [name] is a 87 y.o. female who presents for Medicare Annual (Subsequent) preventive examination.  Review of Systems           Objective:    There were no vitals filed for this visit. There is no height or weight on file to calculate BMI.  Advanced Directives 12/27/2021 11/24/2021 10/17/2021 05/24/2021 03/25/2021 02/28/2021 12/23/2020  Does Patient Have a Medical Advance Directive? Yes Yes Yes Yes Yes Yes Yes  Type of Advance Directive Out of facility DNR (pink MOST or yellow form) Out of facility DNR (pink MOST or yellow form) - Out of facility DNR (pink MOST or yellow form) Out of facility DNR (pink MOST or yellow form) Out of facility DNR (pink MOST or yellow form);Healthcare Power of [employer] of facility DNR (pink MOST or yellow form)  Does patient want to make changes to medical advance directive? No - Patient declined No - Patient declined - No - Patient declined No - Patient declined No - Patient declined No - Patient declined  Copy of [place] in Chart? - - - - - Yes - validated most recent copy scanned in chart (See row information) -  Would patient like information on creating a medical advance directive? - - - - - - -  Pre-existing out of facility DNR order (yellow form or pink MOST form) Yellow form placed in chart (order not valid for inpatient use) - - - - - Yellow form placed in chart (order not valid for inpatient use)    Current Medications (verified) Outpatient Encounter Medications as of 12/27/2021  Medication Sig   albuterol (VENTOLIN HFA) 108 (90 Base) MCG/ACT inhaler Inhale 2 puffs into the lungs every 6 (six) hours as needed for wheezing or shortness of breath.   Brexpiprazole 1 MG TABS Take 1 mg by mouth at bedtime.    busPIRone (BUSPAR) 5 MG tablet Take 5-10 mg by mouth 2 (two) times daily. 10 MG IN THE MORNING, 5 MG AT DINNER   CALCIUM CITRATE PO Take 600 mg by mouth daily.    clonazePAM (KLONOPIN) 0.5 MG tablet Take 1  tablet by mouth as needed in middle of the nite to go back to sleep   CRANBERRY PO Take 2 tablets by mouth daily.   D-Mannose 500 MG CAPS Take by mouth daily.   donepezil (ARICEPT) 10 MG tablet Take half tablet (5 mg) daily for 2 weeks, then increase to the full tablet at 10 mg daily   DULoxetine (CYMBALTA) 30 MG capsule Take 30 mg by mouth in the morning, at noon, and at bedtime.   ELIQUIS 5 MG TABS tablet TAKE 1 TABLET BY MOUTH TWICE A DAY   furosemide (LASIX) 20 MG tablet TAKE 1 TABLET BY MOUTH EVERY DAY   levothyroxine (SYNTHROID) 50 MCG tablet TAKE 1 TABLET BY MOUTH EVERY DAY BEFORE BREAKFAST   mirtazapine (REMERON) 15 MG tablet Take 7.5 mg by mouth at bedtime.   Multiple Vitamins-Minerals (MULTIVITAMIN PO) Take 1 tablet by mouth daily.    oxybutynin (DITROPAN-XL) 10 MG 24 hr tablet Take 10 mg by mouth daily.   oxyCODONE-acetaminophen (PERCOCET) 5-325 MG tablet Take 1 tablet by mouth every 4 (four) hours as needed for severe pain.   potassium chloride SA (KLOR-CON M20) 20 MEQ tablet Take 1 tablet (20 mEq total) by mouth daily.   Probiotic Product (PROBIOTIC DAILY PO) Take 1 tablet by mouth daily. OTC   senna-docusate (SENOKOT-S) 8.6-50 MG tablet Take  1 tablet by mouth 2 (two) times daily as needed for mild constipation.   traZODone (DESYREL) 100 MG tablet Take 100 mg by mouth at bedtime.   vitamin B-12 (CYANOCOBALAMIN) 500 MCG tablet Take 500 mcg by mouth daily.   No facility-administered encounter medications on file as of 12/27/2021.    Allergies (verified) Clindamycin hcl   History: Past Medical History:  Diagnosis Date   A-fib ([place])    Anemia    Anxiety    Arthritis    hands, spine    Bladder incontinence    Blood transfusion    Cancer ([place]) 02/2019   left breast cancer   Cervicalgia    Constipation    Cough    [place]-onset 08/2009, as of 2014- resolved    Deep venous thrombosis ([place])    post op, rec'd/needed  blood thinner    Depression    Depression with anxiety     Dysrhythmia    a-fib   Falls frequently    pt. reports that she was falling 3 times a day, has had PT at a facility for a while & now is getting home PT 2-3 times/ week    Headache(784.0) 09/27/2011   History of stress test    15-20 yrs.ago    Hypotension    Hypothyroidism    Lumbar radiculopathy 09/26/2011   Pneumonia    never been in hosp. for pneumonia    Prediabetes    Right hand fracture    Monday   Shortness of breath    Sleep apnea    does not use consistently   Past Surgical History:  Procedure Laterality Date   ANTERIOR CERVICAL DECOMP/DISCECTOMY FUSION N/A 07/09/2013   Procedure: ANTERIOR CERVICAL DECOMPRESSION/DISCECTOMY FUSION 3 LEVELS;  Surgeon: Sinclair Ship, MD;  Location: [facility];  Service: Orthopedics;  Laterality: N/A;  Anterior cervical decompression fusion, cervical 3-4, cervical 4-5-, cervical 5-6 with instrumentation, allograft.   APPENDECTOMY     BACK SURGERY     2000   BREAST EXCISIONAL BIOPSY     breast bxs on left x 2, breast bx of right x 1-all benign   EYE SURGERY     cataracts bilateral /w IOL   HERNIA REPAIR     umbilical hernia- 4259   IR IMAGING GUIDED PORT INSERTION  04/28/2019   JOINT REPLACEMENT  2006,2007   bilateral   LAPAROSCOPIC OVARIAN CYSTECTOMY     not done by laparscopy-abdominal incision   LEG SURGERY     ORIF PERIPROSTHETIC FRACTURE Right 07/26/2019   Procedure: OPEN REDUCTION INTERNAL FIXATION (ORIF) RIGHT FEMUR PERIPROSTHETIC FRACTURE;  Surgeon: Rod Can, MD;  Location: [facility];  Service: Orthopedics;  Laterality: Right;   PORTACATH PLACEMENT Right 04/01/2019   Procedure: ATTEMPTED INSERTION PORT-A-CATH WITH ULTRASOUND;  Surgeon: [name], MD;  Location: [facility];  Service: General;  Laterality: Right;   RADIOACTIVE SEED GUIDED PARTIAL MASTECTOMY WITH AXILLARY SENTINEL LYMPH NODE BIOPSY Left 04/01/2019   Procedure: LEFT BREAST PARTIAL MASTECTOMY WITH RADIOACTIVE SEED AND LEFT SENTINEL LYMPH NODE  BIOPSY;  Surgeon: [name], MD;  Location: [facility];  Service: General;  Laterality: Left;   RE-EXCISION OF BREAST CANCER,SUPERIOR MARGINS Left 04/15/2019   Procedure: RE-EXCISION OF LEFT BREAST CANCER POSITIVE MARGINS;  Surgeon: [name], MD;  Location: [facility];  Service: General;  Laterality: Left;   REPLACEMENT TOTAL KNEE BILATERAL     TONSILLECTOMY     Family History  Problem Relation Age of Onset  Heart disease Father    Malignant hyperthermia Father    Arthritis Father    Deep vein thrombosis Son    Diabetes Son    Obesity Son    Depression Son    Hypertension Son    Parkinson's disease Mother    Diabetes Daughter    Hypertension Daughter    Anxiety disorder Daughter    Hyperlipidemia Son    Hypertension Son    Cancer Maternal Aunt    Social History   Socioeconomic History   Marital status: Single    Spouse name: Not on file   Number of children: 3   Years of education: College   Highest education level: Not on file  Occupational History   Occupation: Retired Therapist, sports  Tobacco Use   Smoking status: Former    Packs/day: 0.25    Years: 20.00    Pack years: 5.00    Types: Cigarettes    Quit date: 01/18/2021    Years since quitting: 0.9   Smokeless tobacco: Never   Tobacco comments:    1-2 cigarettes a day  Vaping Use   Vaping Use: Never used  Substance and Sexual Activity   Alcohol use: Not Currently    Comment: Glass of wine 1-2 times a year at Christmas   Drug use: No   Sexual activity: Never  Other Topics Concern   Not on file  Social History Narrative   Patient is single and lives alone.   Patient is retired.   Patient has a college education.   Patient has three adult children   Patient is right-handed.   Patient drinks 3-4 cups of caffeine (soda and coffee) daily.   Walks with walker   Social Determinants of Health   Financial Resource Strain: Not on file  Food Insecurity: Not on file   Transportation Needs: Not on file  Physical Activity: Not on file  Stress: Not on file  Social Connections: Not on file    Tobacco Counseling Counseling given: Not Answered Tobacco comments: 1-2 cigarettes a day   Clinical Intake:                 Diabetic?no         Activities of Daily Living No flowsheet data found.  Patient Care Team: Ngetich, Nelda Bucks, NP as PCP - General (Family Medicine) Tanda Rockers, MD as Consulting Physician (Pulmonary Disease) Deveron Furlong, NP as Nurse Practitioner (Psychiatry)  Indicate any recent Medical Services you may have received from other than [facility] providers in the past year (date may be approximate).     Assessment:   This is a routine wellness examination for [facility].  Hearing/Vision screen Hearing Screening - Comments:: Patient wears hearing aids. Vision Screening - Comments:: Patient has some vision problems. Patient has appointment scheduled. Patient goes to [facility] care.  Dietary issues and exercise activities discussed:     Goals Addressed   None    Depression Screen PHQ 2/9 Scores 12/27/2021 12/23/2020 11/25/2020 07/22/2020 12/04/2019 07/03/2019 02/27/2019  PHQ - 2 Score 0 0 0 0 0 0 0  PHQ- 9 Score - - - - - - -    Fall Risk Fall Risk  12/27/2021 11/24/2021 10/17/2021 09/29/2021 05/24/2021  Falls in the past year? 0 0 0 0 0  Number falls in past yr: 0 0 0 0 0  Comment - - - - -  Injury with Fall? 0 0 0 0 0  Comment - - - - -  Risk  for fall due to : No Fall Risks No Fall Risks - No Fall Risks No Fall Risks  Follow up Falls evaluation completed Falls evaluation completed - Falls evaluation completed Falls evaluation completed    [place]:  Any stairs in or around the home? No  If so, are there any without handrails? No  Home free of loose throw rugs in walkways, pet beds, electrical cords, etc? Yes  Adequate lighting in your home to reduce risk of falls? Yes   ASSISTIVE  DEVICES UTILIZED TO PREVENT FALLS:  Life alert? Yes  Use of a cane, walker or w/c? Yes  Grab bars in the bathroom? No  Shower chair or bench in shower? No  Elevated toilet seat or a handicapped toilet? Yes   TIMED UP AND GO:  Was the test performed? No .   Cognitive Function: MMSE - Mini Mental State Exam 09/29/2021 12/10/2018 12/03/2017 11/27/2016 03/05/2014  Orientation to time 1 5 5 5 5   Orientation to Place 2 4 4 5 5   Registration 2 3 3 3 3   Attention/ Calculation 0 4 5 5 5   Recall 2 2 1 3 3   Language- name 2 objects 2 2 2 2 2   Language- repeat 1 1 1 1 1   Language- follow 3 step command 2 2 3 3 3   Language- read & follow direction 1 1 1 1 1   Write a sentence 0 1 1 1 1   Copy design 0 0 1 1 1   Total score 13 25 27 30 30      6CIT Screen 12/23/2020  What Year? 0 points  What month? 0 points  What time? 0 points  Count back from 20 4 points  Months in reverse 4 points  Repeat phrase 10 points  Total Score 18    Immunizations Immunization History  Administered Date(s) Administered   Fluad Quad(high Dose 65+) 07/22/2020   Influenza Split 08/14/2013   Influenza Whole 08/20/2009   Influenza, High Dose Seasonal PF 09/11/2017, 07/25/2018, 09/14/2021   Influenza,inj,Quad PF,6+ Mos 08/04/2014, 09/06/2015, 07/10/2016   PFIZER Comirnaty(Gray Top)Covid-19 Tri-Sucrose Vaccine 02/23/2021   PFIZER(Purple Top)SARS-COV-2 Vaccination 12/27/2019, 01/17/2020, 08/30/2020, 09/14/2021   Pneumococcal Conjugate-13 01/14/2015   Pneumococcal Polysaccharide-23 07/11/2013   Td 11/20/2005, 11/27/2016    TDAP status: Up to date  Flu Vaccine status: Up to date  Pneumococcal vaccine status: Up to date  Covid-19 vaccine status: Information provided on how to obtain vaccines.   Qualifies for Shingles Vaccine? Yes   Zostavax completed No   Shingrix Completed?: No.    Education has been provided regarding the importance of this vaccine. Patient has been advised to call insurance company to  determine out of pocket expense if they have not yet received this vaccine. Advised may also receive vaccine at local pharmacy or Health Dept. Verbalized acceptance and understanding.  Screening Tests Health Maintenance  Topic Date Due   Zoster Vaccines- Shingrix (1 of 2) Never done   OPHTHALMOLOGY EXAM  02/16/2021   FOOT EXAM  07/22/2021   COVID-19 Vaccine (6 - Booster for Pfizer series) 11/09/2021   HEMOGLOBIN A1C  05/24/2022   URINE MICROALBUMIN  11/24/2022   TETANUS/TDAP  11/27/2026   Pneumonia Vaccine 34+ Years old  Completed   INFLUENZA VACCINE  Completed   DEXA SCAN  Completed   HPV VACCINES  Aged Out    Health Maintenance  Health Maintenance Due  Topic Date Due   Zoster Vaccines- Shingrix (1 of 2) Never done  OPHTHALMOLOGY EXAM  02/16/2021   FOOT EXAM  07/22/2021   COVID-19 Vaccine (6 - Booster for Pfizer series) 11/09/2021    Colorectal cancer screening: No longer required.   Mammogram status: No longer required due to age.  Bone Density status: Completed 06/06/21. Results reflect: Bone density results: OSTEOPENIA. Repeat every 2 years.  Lung Cancer Screening: (Low Dose CT Chest recommended if Age 25-80 years, 30 pack-year currently smoking OR have quit w/in 15years.) does not qualify.    Additional Screening:  Hepatitis C Screening: does not qualify  Vision Screening: Recommended annual ophthalmology exams for early detection of glaucoma and other disorders of the eye. Is the patient up to date with their annual eye exam?  Yes  Who is the provider or what is the name of the office in which the patient attends annual eye exams? Dr Mancel Bale If pt is not established with a provider, would they like to be referred to a provider to establish care? No .   Dental Screening: Recommended annual dental exams for proper oral hygiene  Community Resource Referral / Chronic Care Management: CRR required this visit?  No   CCM required this visit?  No      Plan:      I have personally reviewed and noted the following in the patients chart:   Medical and social history Use of alcohol, tobacco or illicit drugs  Current medications and supplements including opioid prescriptions.  Functional ability and status Nutritional status Physical activity Advanced directives List of other physicians Hospitalizations, surgeries, and ER visits in previous 12 months Vitals Screenings to include cognitive, depression, and falls Referrals and appointments  In addition, I have reviewed and discussed with patient certain preventive protocols, quality metrics, and best practice recommendations. A written personalized care plan for preventive services as well as general preventive health recommendations were provided to patient.     Lauree Chandler, NP   12/27/2021    Virtual Visit via Telephone Note  I connected with patient 12/27/21 at 11:30 AM EST by telephone and verified that I am speaking with the correct person using two identifiers.  Location: Patient: home Provider: [facility]   I discussed the limitations, risks, security and privacy concerns of performing an evaluation and management service by telephone and the availability of in person appointments. I also discussed with the patient that there may be a patient responsible charge related to this service. The patient expressed understanding and agreed to proceed.   I discussed the assessment and treatment plan with the patient. The patient was provided an opportunity to ask questions and all were answered. The patient agreed with the plan and demonstrated an understanding of the instructions.   The patient was advised to call back or seek an in-person evaluation if the symptoms worsen or if the condition fails to improve as anticipated.  I provided 15 minutes of non-face-to-face time during this encounter.  Carlos American. Harle Battiest Avs printed and mailed

## 2021-12-27 NOTE — Telephone Encounter (Signed)
[name], cinnamon are scheduled for a virtual visit with your provider today.    Just as we do with appointments in the office, we must obtain your consent to participate.  Your consent will be active for this visit and any virtual visit you may have with one of our providers in the next 365 days.    If you have a MyChart account, I can also send a copy of this consent to you electronically.  All virtual visits are billed to your insurance company just like a traditional visit in the office.  As this is a virtual visit, video technology does not allow for your provider to perform a traditional examination.  This may limit your provider's ability to fully assess your condition.  If your provider identifies any concerns that need to be evaluated in person or the need to arrange testing such as labs, EKG, etc, we will make arrangements to do so.    Although advances in technology are sophisticated, we cannot ensure that it will always work on either your end or our end.  If the connection with a video visit is poor, we may have to switch to a telephone visit.  With either a video or telephone visit, we are not always able to ensure that we have a secure connection.   I need to obtain your verbal consent now.   Are you willing to proceed with your visit today?   Andrea [name] has provided verbal consent on 12/27/2021 for a virtual visit (video or telephone).   Carroll Kinds, CMA 12/27/2021  11:13 AM

## 2021-12-27 NOTE — Progress Notes (Signed)
This service is provided via telemedicine  No vital signs collected/recorded due to the encounter was a telemedicine visit.   Location of patient (ex: home, work):  Home  Patient consents to a telephone visit:  Yes, see encounter dated 12/27/2021  Location of the provider (ex: office, home):  [place]  Name of any referring provider:  Ngetich, Dinah, NP  Names of all persons participating in the telemedicine service and their role in the encounter:  [name], Nurse Practitioner, Carroll Kinds, CMA, patient and Caregiver, Olegario Shearer.  Time spent on call:  12 minutes with medical assistant

## 2021-12-27 NOTE — Patient Instructions (Signed)
Andrea [name] , Thank you for taking time to come for your Medicare Wellness Visit. I appreciate your ongoing commitment to your health goals. Please review the following plan we discussed and let me know if I can assist you in the future.   Screening recommendations/referrals: Colonoscopy aged out Mammogram aged out Bone Density up to date Recommended yearly ophthalmology/optometry visit for glaucoma screening and checkup Recommended yearly dental visit for hygiene and checkup  Vaccinations: Influenza vaccine up to date Pneumococcal vaccine up date Tdap vaccine up to date Shingles vaccine RECOMMENDED- to get at local pharmacy  Advanced directives: on file   Conditions/risks identified: advance age, fall risk, progressive memory loss  Next appointment: yearly for AWV   Preventive Care 44 Years and Older, Female Preventive care refers to lifestyle choices and visits with your health care provider that can promote health and wellness. What does preventive care include? A yearly physical exam. This is also called an annual well check. Dental exams once or twice a year. Routine eye exams. Ask your health care provider how often you should have your eyes checked. Personal lifestyle choices, including: Daily care of your teeth and gums. Regular physical activity. Eating a healthy diet. Avoiding tobacco and drug use. Limiting alcohol use. Practicing safe sex. Taking low-dose aspirin every day. Taking vitamin and mineral supplements as recommended by your health care provider. What happens during an annual well check? The services and screenings done by your health care provider during your annual well check will depend on your age, overall health, lifestyle risk factors, and family history of disease. Counseling  Your health care provider may ask you questions about your: Alcohol use. Tobacco use. Drug use. Emotional well-being. Home and relationship well-being. Sexual  activity. Eating habits. History of falls. Memory and ability to understand (cognition). Work and work Statistician. Reproductive health. Screening  You may have the following tests or measurements: Height, weight, and BMI. Blood pressure. Lipid and cholesterol levels. These may be checked every 5 years, or more frequently if you are over 92 years old. Skin check. Lung cancer screening. You may have this screening every year starting at age 64 if you have a 30-pack-year history of smoking and currently smoke or have quit within the past 15 years. Fecal occult blood test (FOBT) of the stool. You may have this test every year starting at age 25. Flexible sigmoidoscopy or colonoscopy. You may have a sigmoidoscopy every 5 years or a colonoscopy every 10 years starting at age 81. Hepatitis C blood test. Hepatitis B blood test. Sexually transmitted disease (STD) testing. Diabetes screening. This is done by checking your blood sugar (glucose) after you have not eaten for a while (fasting). You may have this done every 1-3 years. Bone density scan. This is done to screen for osteoporosis. You may have this done starting at age 64. Mammogram. This may be done every 1-2 years. Talk to your health care provider about how often you should have regular mammograms. Talk with your health care provider about your test results, treatment options, and if necessary, the need for more tests. Vaccines  Your health care provider may recommend certain vaccines, such as: Influenza vaccine. This is recommended every year. Tetanus, diphtheria, and acellular pertussis (Tdap, Td) vaccine. You may need a Td booster every 10 years. Zoster vaccine. You may need this after age 53. Pneumococcal 13-valent conjugate (PCV13) vaccine. One dose is recommended after age 92. Pneumococcal polysaccharide (PPSV23) vaccine. One dose is recommended after age 71. Talk  to your health care provider about which screenings and vaccines  you need and how often you need them. This information is not intended to replace advice given to you by your health care provider. Make sure you discuss any questions you have with your health care provider. Document Released: 12/03/2015 Document Revised: 07/26/2016 Document Reviewed: 09/07/2015 Elsevier Interactive Patient Education  2017 [place] Prevention in the Home Falls can cause injuries. They can happen to people of all ages. There are many things you can do to make your home safe and to help prevent falls. What can I do on the outside of my home? Regularly fix the edges of walkways and driveways and fix any cracks. Remove anything that might make you trip as you walk through a door, such as a raised step or threshold. Trim any bushes or trees on the path to your home. Use bright outdoor lighting. Clear any walking paths of anything that might make someone trip, such as rocks or tools. Regularly check to see if handrails are loose or broken. Make sure that both sides of any steps have handrails. Any raised decks and porches should have guardrails on the edges. Have any leaves, snow, or ice cleared regularly. Use sand or salt on walking paths during winter. Clean up any spills in your garage right away. This includes oil or grease spills. What can I do in the bathroom? Use night lights. Install grab bars by the toilet and in the tub and shower. Do not use towel bars as grab bars. Use non-skid mats or decals in the tub or shower. If you need to sit down in the shower, use a plastic, non-slip stool. Keep the floor dry. Clean up any water that spills on the floor as soon as it happens. Remove soap buildup in the tub or shower regularly. Attach bath mats securely with double-sided non-slip rug tape. Do not have throw rugs and other things on the floor that can make you trip. What can I do in the bedroom? Use night lights. Make sure that you have a light by your bed that  is easy to reach. Do not use any sheets or blankets that are too big for your bed. They should not hang down onto the floor. Have a firm chair that has side arms. You can use this for support while you get dressed. Do not have throw rugs and other things on the floor that can make you trip. What can I do in the kitchen? Clean up any spills right away. Avoid walking on wet floors. Keep items that you use a lot in easy-to-reach places. If you need to reach something above you, use a strong step stool that has a grab bar. Keep electrical cords out of the way. Do not use floor polish or wax that makes floors slippery. If you must use wax, use non-skid floor wax. Do not have throw rugs and other things on the floor that can make you trip. What can I do with my stairs? Do not leave any items on the stairs. Make sure that there are handrails on both sides of the stairs and use them. Fix handrails that are broken or loose. Make sure that handrails are as long as the stairways. Check any carpeting to make sure that it is firmly attached to the stairs. Fix any carpet that is loose or worn. Avoid having throw rugs at the top or bottom of the stairs. If you do have throw rugs,  attach them to the floor with carpet tape. Make sure that you have a light switch at the top of the stairs and the bottom of the stairs. If you do not have them, ask someone to add them for you. What else can I do to help prevent falls? Wear shoes that: Do not have high heels. Have rubber bottoms. Are comfortable and fit you well. Are closed at the toe. Do not wear sandals. If you use a stepladder: Make sure that it is fully opened. Do not climb a closed stepladder. Make sure that both sides of the stepladder are locked into place. Ask someone to hold it for you, if possible. Clearly mark and make sure that you can see: Any grab bars or handrails. First and last steps. Where the edge of each step is. Use tools that help you  move around (mobility aids) if they are needed. These include: Canes. Walkers. Scooters. Crutches. Turn on the lights when you go into a dark area. Replace any light bulbs as soon as they burn out. Set up your furniture so you have a clear path. Avoid moving your furniture around. If any of your floors are uneven, fix them. If there are any pets around you, be aware of where they are. Review your medicines with your doctor. Some medicines can make you feel dizzy. This can increase your chance of falling. Ask your doctor what other things that you can do to help prevent falls. This information is not intended to replace advice given to you by your health care provider. Make sure you discuss any questions you have with your health care provider. Document Released: 09/02/2009 Document Revised: 04/13/2016 Document Reviewed: 12/11/2014 Elsevier Interactive Patient Education  2017 [employer].

## 2021-12-28 NOTE — Procedures (Signed)
ELECTROENCEPHALOGRAM REPORT  Date of Study: 12/22/2021  Patient's Name: Andrea [name] MRN: [id number] Date of Birth: 06/28/1935  Referring Provider: Sharene Butters, PA-C  Clinical History: This is an 87 year old woman with episodes of jerking. EEG for classification.  Medications: KEPPRA 500 mg VENTOLIN HFA 108 (90 Base) MCG/ACT inhaler REXULTI 1 mg BUSPAR 5-10 mg CALCIUM CITRATE PO 600 mg CIPRO 500 mg KLONOPIN 0.5 MG tablet CRANBERRY PO D-Mannose 500 MG CAPS CYMBALTA 30 mg ELIQUIS 5 MG TABS tablet LASIX 20 MG tablet SYNTHROID 50 MCG tablet REMERON  7.5 mg MULTIVITAMIN PO DITROPAN-XL PERCOCET 5-325 MG tablet KLOR-CON M20 20 MEQ tablet PROBIOTIC DAILY PO SENOKOT-S 8.6-50 MG tablet DESYREL 100 mg CYANOCOBALAMIN 500 mcg  Technical Summary: A multichannel digital EEG recording measured by the international 10-20 system with electrodes applied with paste and impedances below 5000 ohms performed in our laboratory with EKG monitoring in an awake and asleep patient.  Hyperventilation was not performed. Photic stimulation was performed.  The digital EEG was referentially recorded, reformatted, and digitally filtered in a variety of bipolar and referential montages for optimal display.    Description: The patient is awake and asleep during the recording.  During maximal wakefulness, there is a symmetric, medium voltage 8 Hz posterior dominant rhythm that attenuates with eye opening.  The record is symmetric.  During drowsiness and sleep, there is an increase in theta and delta slowing of the background with vertex waves seen. Photic stimulation did not elicit any abnormalities.  There were no epileptiform discharges or electrographic seizures seen.    EKG lead was unremarkable.  Impression: This awake and asleep EEG is normal.    Clinical Correlation: A normal EEG does not exclude a clinical diagnosis of epilepsy.  If further clinical questions remain, prolonged EEG may be helpful.   Clinical correlation is advised.   Ellouise Newer, M.D.

## 2021-12-28 NOTE — Telephone Encounter (Signed)
This encounter was created in error - please disregard.

## 2021-12-29 DIAGNOSIS — H16141 Punctate keratitis, right eye: Secondary | ICD-10-CM | POA: Diagnosis not present

## 2021-12-29 DIAGNOSIS — H18519 Endothelial corneal dystrophy, unspecified eye: Secondary | ICD-10-CM | POA: Diagnosis not present

## 2021-12-30 ENCOUNTER — Other Ambulatory Visit: Payer: Self-pay | Admitting: Family

## 2021-12-30 DIAGNOSIS — R0602 Shortness of breath: Secondary | ICD-10-CM

## 2021-12-31 ENCOUNTER — Other Ambulatory Visit: Payer: Self-pay | Admitting: Family

## 2021-12-31 DIAGNOSIS — Z79899 Other long term (current) drug therapy: Secondary | ICD-10-CM

## 2022-01-02 DIAGNOSIS — R262 Difficulty in walking, not elsewhere classified: Secondary | ICD-10-CM | POA: Diagnosis not present

## 2022-01-02 DIAGNOSIS — M6281 Muscle weakness (generalized): Secondary | ICD-10-CM | POA: Diagnosis not present

## 2022-01-05 DIAGNOSIS — R262 Difficulty in walking, not elsewhere classified: Secondary | ICD-10-CM | POA: Diagnosis not present

## 2022-01-05 DIAGNOSIS — M6281 Muscle weakness (generalized): Secondary | ICD-10-CM | POA: Diagnosis not present

## 2022-01-10 DIAGNOSIS — H18519 Endothelial corneal dystrophy, unspecified eye: Secondary | ICD-10-CM | POA: Diagnosis not present

## 2022-01-10 DIAGNOSIS — M6281 Muscle weakness (generalized): Secondary | ICD-10-CM | POA: Diagnosis not present

## 2022-01-10 DIAGNOSIS — R262 Difficulty in walking, not elsewhere classified: Secondary | ICD-10-CM | POA: Diagnosis not present

## 2022-01-10 DIAGNOSIS — H16141 Punctate keratitis, right eye: Secondary | ICD-10-CM | POA: Diagnosis not present

## 2022-01-13 DIAGNOSIS — M6281 Muscle weakness (generalized): Secondary | ICD-10-CM | POA: Diagnosis not present

## 2022-01-13 DIAGNOSIS — R262 Difficulty in walking, not elsewhere classified: Secondary | ICD-10-CM | POA: Diagnosis not present

## 2022-01-17 DIAGNOSIS — M6281 Muscle weakness (generalized): Secondary | ICD-10-CM | POA: Diagnosis not present

## 2022-01-17 DIAGNOSIS — R262 Difficulty in walking, not elsewhere classified: Secondary | ICD-10-CM | POA: Diagnosis not present

## 2022-01-18 ENCOUNTER — Ambulatory Visit (INDEPENDENT_AMBULATORY_CARE_PROVIDER_SITE_OTHER): Payer: Medicare Other | Admitting: Physician Assistant

## 2022-01-18 ENCOUNTER — Encounter: Payer: Self-pay | Admitting: Family

## 2022-01-18 ENCOUNTER — Other Ambulatory Visit: Payer: Self-pay

## 2022-01-18 VITALS — HR 81 | Resp 18 | Ht 62.0 in | Wt 205.0 lb

## 2022-01-18 DIAGNOSIS — F01518 Vascular dementia, unspecified severity, with other behavioral disturbance: Secondary | ICD-10-CM

## 2022-01-18 DIAGNOSIS — F03918 Unspecified dementia, unspecified severity, with other behavioral disturbance: Secondary | ICD-10-CM | POA: Diagnosis not present

## 2022-01-18 NOTE — Progress Notes (Signed)
Assessment/Plan:      Dementia with  behavioral disturbance likely mixed vascular and Alzheimer's disease   Recommendations:  Discussed safety both in and out of the home.  Discussed the importance of regular daily schedule with inclusion of crossword puzzles to maintain brain function.  Continue to monitor mood by PCP Stay active at least 30 minutes at least 3 times a week.  Naps should be scheduled and should be no longer than 60 minutes and should not occur after 2 PM.  Mediterranean diet is recommended  Control cardiovascular risk factors  Continue donepezil 10 mg daily Side effects were discussed Follow up in  6 months.   Case discussed with Dr. Delice Lesch who agrees with the plan     Subjective:    Andrea [name] is a very pleasant 87 y.o. RH female  seen today in follow up for memory loss. This patient is accompanied in the office by her caregiver who supplements the history.  Previous records as well as any outside records available were reviewed prior to todays visit.  Patient was last seen at our office on 10/17/21  at which time her MMSE was 13/30. Patient is currently on donepezil 10 mg daily tolerating well.  Since her last visit, the patient reports her memory being about the same, although at night may be worse.  She continues to repeating herself, asking the same questions.  She denies leaving objects in unusual places.  She lives with her daughter.  She may have occasional hallucinations, stating that "there are some folks that they are coming to visit me ".  She does not have much social interaction, she tried the senior center, but did not enjoy, as she has "never been to social ".   Her mood is usually good, only when the caregiver is not there, she may show more concern, and feel "more down".  When the caregiver is there, she has more social interaction.  She no longer drives.  She sleeps well, "a lot more than before ".  She denies any vivid dreams or sleepwalking.   No hallucinations or paranoia.  She uses a walker to ambulate, and a wheelchair at times.  If she does not have the walker in front of her, she will have a panic episode, breathing heavier.  She is on PT both at home and at [facility].  No recent falls or head injuries.  The caregiver is not aware of any hygiene concerns, she needs assistance with bathing and dressing.  The caregiver is in charge of the medications, and the finances are taking care of by the daughter.  Her appetite is good, denies trouble swallowing.  She no longer cooks. Denies headaches, double vision, dizziness, focal numbness or tingling, unilateral weakness or tremors or anosmia. No history of seizures.  As mentioned above, she has frequent UTIs, wears pads and pull ups, retention, denies constipation or diarrhea.  She denies anosmia, she did not have any recent COVID.  She has OSA, but lately has not been using the CPAP, because "makes me cold ".     Labs 05/2021 TSH 1.26, B12 1272  Initial visit 10/17/2021 the patient is seen in neurologic consultation at the request of Ngetich, [place], NP for the evaluation of memory loss.  The patient is accompanied by her caregiver who supplements the history.This is a 87 y.o. year old female who has had worsening memory issues for about few months when she would get UTIs ( 5 bouts  of them ).  However, the caregiver reports that her memory changes may have been present for at least 1-1/2-year, when she appears to be more disoriented, and has more trouble to remember new information, or people's names, or "mixes them ".  For example, she has a son who lives in [place], she cannot remember his name.  She denies repeating the same stories or asking the same questions.  However, she has been forgetting for example, how to sit in a chair or which way she needs to go when she is going from one room to another.  The patient states that "it may have been there for a longer time than that, but I did  not want to tell anyone".  She denies leaving objects in unusual places.  She lives with her daughter.  Her mood is usually good, only when the caregiver is not there, she may show more concern, and feel "more down".  When the caregiver is there, she has more social interaction.  She no longer drives.  She sleeps well, "a lot more than before ".  She denies any vivid dreams or sleepwalking.  No hallucinations or paranoia.  She uses a walker to ambulate, and a wheelchair at times.  In the past, she tried physical therapy 2-3 times a week, but she is no longer interested, "she did not like it ".  No recent falls or head injuries.  The caregiver is not aware of any hygiene concerns, she needs assistance with bathing and dressing.  The caregiver is in charge of the medications, and the finances are taking care of by the daughter.  Her appetite is good, denies trouble swallowing.  She no longer cooks. Denies headaches, double vision, dizziness, focal numbness or tingling, unilateral weakness or tremors or anosmia. No history of seizures.  As mentioned above, she has frequent UTIs, wears pads and pull ups , retention, denies constipation or diarrhea.  She denies anosmia, she did not have any recent COVID.  She has OSA, but lately has not been using the CPAP, because "makes me cold ".  She denies any alcohol or tobacco abuse.  Family history negative for Alzheimer's disease or any other type of dementia.   Labs 05/2021 TSH 1.26, B12 1272  PREVIOUS MEDICATIONS:   CURRENT MEDICATIONS:  Outpatient Encounter Medications as of 01/18/2022  Medication Sig   albuterol (VENTOLIN HFA) 108 (90 Base) MCG/ACT inhaler Inhale 2 puffs into the lungs every 6 (six) hours as needed for wheezing or shortness of breath.   Brexpiprazole 1 MG TABS Take 1 mg by mouth at bedtime.    busPIRone (BUSPAR) 5 MG tablet Take 5-10 mg by mouth 2 (two) times daily. 10 MG IN THE MORNING, 5 MG AT DINNER   CALCIUM CITRATE PO Take 600 mg by mouth  daily.    clonazePAM (KLONOPIN) 0.5 MG tablet Take 1 tablet by mouth as needed in middle of the nite to go back to sleep   CRANBERRY PO Take 2 tablets by mouth daily.   D-Mannose 500 MG CAPS Take by mouth daily.   donepezil (ARICEPT) 10 MG tablet Take half tablet (5 mg) daily for 2 weeks, then increase to the full tablet at 10 mg daily   DULoxetine (CYMBALTA) 30 MG capsule Take 30 mg by mouth in the morning, at noon, and at bedtime.   ELIQUIS 5 MG TABS tablet TAKE 1 TABLET BY MOUTH TWICE A DAY   furosemide (LASIX) 20 MG tablet TAKE 1 TABLET BY  MOUTH EVERY DAY   levothyroxine (SYNTHROID) 50 MCG tablet TAKE 1 TABLET BY MOUTH EVERY DAY BEFORE BREAKFAST   mirtazapine (REMERON) 15 MG tablet Take 7.5 mg by mouth at bedtime.   Multiple Vitamins-Minerals (MULTIVITAMIN PO) Take 1 tablet by mouth daily.    oxybutynin (DITROPAN-XL) 10 MG 24 hr tablet Take 10 mg by mouth daily.   oxyCODONE-acetaminophen (PERCOCET) 5-325 MG tablet Take 1 tablet by mouth every 4 (four) hours as needed for severe pain.   potassium chloride SA (KLOR-CON M20) 20 MEQ tablet TAKE 1 TABLET BY MOUTH EVERY DAY   Probiotic Product (PROBIOTIC DAILY PO) Take 1 tablet by mouth daily. OTC   senna-docusate (SENOKOT-S) 8.6-50 MG tablet Take 1 tablet by mouth 2 (two) times daily as needed for mild constipation.   traZODone (DESYREL) 100 MG tablet Take 100 mg by mouth at bedtime.   vitamin B-12 (CYANOCOBALAMIN) 500 MCG tablet Take 500 mcg by mouth daily.   No facility-administered encounter medications on file as of 01/18/2022.     Objective:     PHYSICAL EXAMINATION:    VITALS:   Vitals:   01/18/22 1127  Pulse: 81  Resp: 18  SpO2: 96%  Weight: 205 lb (93 kg)  Height: 5\' 2"  (1.575 m)    GEN:  The patient appears stated age and is in NAD. HEENT:  Normocephalic, atraumatic.   Neurological examination:  General: NAD, well-groomed, appears stated age. Orientation: The patient is alert. Oriented to person, place not to  date Cranial nerves: There is good facial symmetry.The speech is fluent and clear. No aphasia or dysarthria. Fund of knowledge is appropriate. Recent and remote memory are impaired. Attention and concentration are reduced.  Able to name objects and repeat phrases.  Hearing is intact to conversational tone.    Sensation: Sensation is intact to light touch throughout Motor: Strength is at least antigravity x4. Tremors: none  DTR's 2/4 in UE/LE    No flowsheet data found. MMSE - Mini Mental State Exam 09/29/2021 12/10/2018 12/03/2017  Orientation to time 1 5 5   Orientation to Place 2 4 4   Registration 2 3 3   Attention/ Calculation 0 4 5  Recall 2 2 1   Language- name 2 objects 2 2 2   Language- repeat 1 1 1   Language- follow 3 step command 2 2 3   Language- read & follow direction 1 1 1   Write a sentence 0 1 1  Copy design 0 0 1  Total score 13 25 27     No flowsheet data found.     Movement examination: Tone: There is normal tone in the UE/LE Abnormal movements:  no tremor.  No myoclonus.  No asterixis.   Coordination:  There is no decremation with RAM's. Normal finger to nose  Gait and Station: The patient has difficulty arising out of a deep-seated chair without the use of the hands, becoming very anxious . Unable to test gait or stride.       Total time spent on today's visit was 30  minutes, including both face-to-face time and nonface-to-face time. Time included that spent on review of records (prior notes available to me/labs/imaging if pertinent), discussing treatment and goals, answering patient's questions and coordinating care.  Cc:  Ngetich, Nelda B[place], NP Sharene Butters, PA-C

## 2022-01-18 NOTE — Patient Instructions (Signed)
It was a pleasure to see you today at our office.  ? ?Recommendations: ? ?Continue Aricept 10 mg daily.  ?Follow up in 6 months ? ? ?RECOMMENDATIONS FOR ALL PATIENTS WITH MEMORY PROBLEMS: ?1. Continue to exercise (Recommend 30 minutes of walking everyday, or 3 hours every week) ?2. Increase social interactions - continue going to [place] and enjoy social gatherings with friends and family ?3. Eat healthy, avoid fried foods and eat more fruits and vegetables ?4. Maintain adequate blood pressure, blood sugar, and blood cholesterol level. Reducing the risk of stroke and cardiovascular disease also helps promoting better memory. ?5. Avoid stressful situations. Live a simple life and avoid aggravations. Organize your time and prepare for the next day in anticipation. ?6. Sleep well, avoid any interruptions of sleep and avoid any distractions in the bedroom that may interfere with adequate sleep quality ?7. Avoid sugar, avoid sweets as there is a strong link between excessive sugar intake, diabetes, and cognitive impairment ?We discussed the Mediterranean diet, which has been shown to help patients reduce the risk of progressive memory disorders and reduces cardiovascular risk. This includes eating fish, eat fruits and green leafy vegetables, nuts like almonds and hazelnuts, walnuts, and also use olive oil. Avoid fast foods and fried foods as much as possible. Avoid sweets and sugar as sugar use has been linked to worsening of memory function. ? ?There is always a concern of gradual progression of memory problems. If this is the case, then we may need to adjust level of care according to patient needs. Support, both to the patient and caregiver, should then be put into place.  ? ? ?FALL PRECAUTIONS: Be cautious when walking. Scan the area for obstacles that may increase the risk of trips and falls. When getting up in the mornings, sit up at the edge of the bed for a few minutes before getting out of bed. Consider elevating  the bed at the head end to avoid drop of blood pressure when getting up. Walk always in a well-lit room (use night lights in the walls). Avoid area rugs or power cords from appliances in the middle of the walkways. Use a walker or a cane if necessary and consider physical therapy for balance exercise. Get your eyesight checked regularly. ? ?FINANCIAL OVERSIGHT: Supervision, especially oversight when making financial decisions or transactions is also recommended. ? ?HOME SAFETY: Consider the safety of the kitchen when operating appliances like stoves, microwave oven, and blender. Consider having supervision and share cooking responsibilities until no longer able to participate in those. Accidents with firearms and other hazards in the house should be identified and addressed as well. ? ? ?ABILITY TO BE LEFT ALONE: If patient is unable to contact 911 operator, consider using [employer]Line, or when the need is there, arrange for someone to stay with patients. Smoking is a fire hazard, consider supervision or cessation. Risk of wandering should be assessed by caregiver and if detected at any point, supervision and safe proof recommendations should be instituted. ? ?MEDICATION SUPERVISION: Inability to self-administer medication needs to be constantly addressed. Implement a mechanism to ensure safe administration of the medications. ? ?

## 2022-01-19 ENCOUNTER — Encounter: Payer: Self-pay | Admitting: Family

## 2022-01-19 ENCOUNTER — Ambulatory Visit (INDEPENDENT_AMBULATORY_CARE_PROVIDER_SITE_OTHER): Payer: Medicare Other | Admitting: Family

## 2022-01-19 VITALS — BP 130/80 | HR 63 | Temp 97.5°F | Resp 16 | Ht 62.0 in | Wt 201.6 lb

## 2022-01-19 DIAGNOSIS — R262 Difficulty in walking, not elsewhere classified: Secondary | ICD-10-CM | POA: Diagnosis not present

## 2022-01-19 DIAGNOSIS — R350 Frequency of micturition: Secondary | ICD-10-CM | POA: Diagnosis not present

## 2022-01-19 DIAGNOSIS — M6281 Muscle weakness (generalized): Secondary | ICD-10-CM | POA: Diagnosis not present

## 2022-01-19 DIAGNOSIS — R1030 Lower abdominal pain, unspecified: Secondary | ICD-10-CM

## 2022-01-19 DIAGNOSIS — F01518 Vascular dementia, unspecified severity, with other behavioral disturbance: Secondary | ICD-10-CM | POA: Insufficient documentation

## 2022-01-19 LAB — POCT URINALYSIS DIPSTICK
Bilirubin, UA: NEGATIVE
Glucose, UA: NEGATIVE
Nitrite, UA: POSITIVE
Protein, UA: POSITIVE — AB
Spec Grav, UA: 1.01 (ref 1.010–1.025)
Urobilinogen, UA: NEGATIVE E.U./dL — AB
pH, UA: 7 (ref 5.0–8.0)

## 2022-01-19 MED ORDER — CIPROFLOXACIN HCL 500 MG PO TABS: 500.0000 mg | ORAL_TABLET | Freq: Two times a day (BID) | ORAL | 0 refills | Status: AC

## 2022-01-19 MED ORDER — DONEPEZIL HCL 10 MG PO TABS: 10.0000 mg | ORAL_TABLET | Freq: Every day | ORAL | 11 refills | Status: DC

## 2022-01-19 NOTE — Patient Instructions (Signed)
Increase fluid intake  ? ?- Notify provider if symptoms get worsen or no relief  ? ?Urinary Tract Infection, Adult ?A urinary tract infection (UTI) is an infection of any part of the urinary tract. The urinary tract includes the kidneys, ureters, bladder, and urethra. These organs make, store, and get rid of urine in the body. ?An upper UTI affects the ureters and kidneys. A lower UTI affects the bladder and urethra. ?What are the causes? ?Most urinary tract infections are caused by bacteria in your genital area around your urethra, where urine leaves your body. These bacteria grow and cause inflammation of your urinary tract. ?What increases the risk? ?You are more likely to develop this condition if: ?You have a urinary catheter that stays in place. ?You are not able to control when you urinate or have a bowel movement (incontinence). ?You are female and you: ?Use a spermicide or diaphragm for birth control. ?Have low estrogen levels. ?Are pregnant. ?You have certain genes that increase your risk. ?You are sexually active. ?You take antibiotic medicines. ?You have a condition that causes your flow of urine to slow down, such as: ?An enlarged prostate, if you are female. ?Blockage in your urethra. ?A kidney stone. ?A nerve condition that affects your bladder control (neurogenic bladder). ?Not getting enough to drink, or not urinating often. ?You have certain medical conditions, such as: ?Diabetes. ?A weak disease-fighting system (immunesystem). ?Sickle cell disease. ?Gout. ?Spinal cord injury. ?What are the signs or symptoms? ?Symptoms of this condition include: ?Needing to urinate right away (urgency). ?Frequent urination. This may include small amounts of urine each time you urinate. ?Pain or burning with urination. ?Blood in the urine. ?Urine that smells bad or unusual. ?Trouble urinating. ?Cloudy urine. ?Vaginal discharge, if you are female. ?Pain in the abdomen or the lower back. ?You may also have: ?Vomiting or  a decreased appetite. ?Confusion. ?Irritability or tiredness. ?A fever or chills. ?Diarrhea. ?The first symptom in older adults may be confusion. In some cases, they may not have any symptoms until the infection has worsened. ?How is this diagnosed? ?This condition is diagnosed based on your medical history and a physical exam. You may also have other tests, including: ?Urine tests. ?Blood tests. ?Tests for STIs (sexually transmitted infections). ?If you have had more than one UTI, a cystoscopy or imaging studies may be done to determine the cause of the infections. ?How is this treated? ?Treatment for this condition includes: ?Antibiotic medicine. ?Over-the-counter medicines to treat discomfort. ?Drinking enough water to stay hydrated. ?If you have frequent infections or have other conditions such as a kidney stone, you may need to see a health care provider who specializes in the urinary tract (urologist). ?In rare cases, urinary tract infections can cause sepsis. Sepsis is a life-threatening condition that occurs when the body responds to an infection. Sepsis is treated in the hospital with IV antibiotics, fluids, and other medicines. ?Follow these instructions at home: ?Medicines ?Take over-the-counter and prescription medicines only as told by your health care provider. ?If you were prescribed an antibiotic medicine, take it as told by your health care provider. Do not stop using the antibiotic even if you start to feel better. ?General instructions ?Make sure you: ?Empty your bladder often and completely. Do not hold urine for long periods of time. ?Empty your bladder after sex. ?Wipe from front to back after urinating or having a bowel movement if you are female. Use each tissue only one time when you wipe. ?Drink enough fluid to  keep your urine pale yellow. ?Keep all follow-up visits. This is important. ?Contact a health care provider if: ?Your symptoms do not get better after 1-2 days. ?Your symptoms go  away and then return. ?Get help right away if: ?You have severe pain in your back or your lower abdomen. ?You have a fever or chills. ?You have nausea or vomiting. ?Summary ?A urinary tract infection (UTI) is an infection of any part of the urinary tract, which includes the kidneys, ureters, bladder, and urethra. ?Most urinary tract infections are caused by bacteria in your genital area. ?Treatment for this condition often includes antibiotic medicines. ?If you were prescribed an antibiotic medicine, take it as told by your health care provider. Do not stop using the antibiotic even if you start to feel better. ?Keep all follow-up visits. This is important. ?This information is not intended to replace advice given to you by your health care provider. Make sure you discuss any questions you have with your health care provider. ?Document Revised: 06/18/2020 Document Reviewed: 06/18/2020 ?Elsevier Patient Education ? [place]. ? ?

## 2022-01-19 NOTE — Progress Notes (Signed)
Provider: Marlowe Sax FNP-C  Jaylin Roundy, Nelda Bucks, NP  Patient Care Team: Larrisa Cravey, Nelda Bucks, NP as PCP - General (Family Medicine) Tanda Rockers, MD as Consulting Physician (Pulmonary Disease) Deveron Furlong, NP as Nurse Practitioner (Psychiatry)  Extended Emergency Contact Information Primary Emergency Contact: Stonesifer,Roberta(Bobbi) Address: [address]          [place], [place] [address] [name] of [place] Phone: [phone] Mobile Phone: [phone] Relation: Daughter Secondary Emergency Contact: Mesmer,David Address: [address]           [place], [place] [address] [place] Phone: [phone] Work Phone: [phone] Relation: Son  Code Status:  DNR Goals of care: Advanced Directive information Advanced Directives 01/18/2022  Does Patient Have a Medical Advance Directive? Yes  Type of Advance Directive Out of facility DNR (pink MOST or yellow form)  Does patient want to make changes to medical advance directive? No - Patient declined  Copy of [place] in Chart? -  Would patient like information on creating a medical advance directive? -  Pre-existing out of facility DNR order (yellow form or pink MOST form) -     Chief Complaint  Patient presents with   Acute Visit    Patient complains of lower abdominal pain after eating lunch, urinary frequency at night. Patient states area under breast hurts.     HPI:  Pt is a 87 y.o. female seen today for an acute visit for evaluation of lower abdominal pain after eating lunch ,urine frequency x several years. Lower back pain hurts when she has lower abdominal Care giver states left breast was painful when she lifted the breast had redness so she applied cream and redness resolved.Has hx of breast cancer on left breast.  She denies any fever,chills    Past Medical History:  Diagnosis Date   A-fib ([place])    Anemia    Anxiety    Arthritis    hands, spine    Bladder  incontinence    Blood transfusion    Cancer ([place]) 02/2019   left breast cancer   Cervicalgia    Constipation    Cough    [place]-onset 08/2009, as of 2014- resolved    Deep venous thrombosis ([place])    post op, rec'd/needed  blood thinner    Depression    Depression with anxiety    Dysrhythmia    a-fib   Falls frequently    pt. reports that she was falling 3 times a day, has had PT at a facility for a while & now is getting home PT 2-3 times/ week    Headache(784.0) 09/27/2011   History of stress test    15-20 yrs.ago    Hypotension    Hypothyroidism    Lumbar radiculopathy 09/26/2011   Pneumonia    never been in hosp. for pneumonia    Prediabetes    Right hand fracture    Monday   Shortness of breath    Sleep apnea    does not use consistently   Past Surgical History:  Procedure Laterality Date   ANTERIOR CERVICAL DECOMP/DISCECTOMY FUSION N/A 07/09/2013   Procedure: ANTERIOR CERVICAL DECOMPRESSION/DISCECTOMY FUSION 3 LEVELS;  Surgeon: Sinclair Ship, MD;  Location: [facility];  Service: Orthopedics;  Laterality: N/A;  Anterior cervical decompression fusion, cervical 3-4, cervical 4-5-, cervical 5-6 with instrumentation, allograft.   APPENDECTOMY     BACK SURGERY     2000   BREAST EXCISIONAL BIOPSY  breast bxs on left x 2, breast bx of right x 1-all benign   EYE SURGERY     cataracts bilateral /w IOL   HERNIA REPAIR     umbilical hernia- 0454   IR IMAGING GUIDED PORT INSERTION  04/28/2019   JOINT REPLACEMENT  2006,2007   bilateral   LAPAROSCOPIC OVARIAN CYSTECTOMY     not done by laparscopy-abdominal incision   LEG SURGERY     ORIF PERIPROSTHETIC FRACTURE Right 07/26/2019   Procedure: OPEN REDUCTION INTERNAL FIXATION (ORIF) RIGHT FEMUR PERIPROSTHETIC FRACTURE;  Surgeon: Rod Can, MD;  Location: [facility];  Service: Orthopedics;  Laterality: Right;   PORTACATH PLACEMENT Right 04/01/2019   Procedure: ATTEMPTED INSERTION PORT-A-CATH WITH ULTRASOUND;  Surgeon: [name], MD;  Location: [facility];  Service: General;  Laterality: Right;   RADIOACTIVE SEED GUIDED PARTIAL MASTECTOMY WITH AXILLARY SENTINEL LYMPH NODE BIOPSY Left 04/01/2019   Procedure: LEFT BREAST PARTIAL MASTECTOMY WITH RADIOACTIVE SEED AND LEFT SENTINEL LYMPH NODE BIOPSY;  Surgeon: [name], MD;  Location: [facility];  Service: General;  Laterality: Left;   RE-EXCISION OF BREAST CANCER,SUPERIOR MARGINS Left 04/15/2019   Procedure: RE-EXCISION OF LEFT BREAST CANCER POSITIVE MARGINS;  Surgeon: [name], MD;  Location: [facility];  Service: General;  Laterality: Left;   REPLACEMENT TOTAL KNEE BILATERAL     TONSILLECTOMY      Allergies  Allergen Reactions   Clindamycin Hcl Other (See Comments)    REACTION: swelling, pt. Reports that she had a 16 lb. weightgain in one day    Outpatient Encounter Medications as of 01/19/2022  Medication Sig   albuterol (VENTOLIN HFA) 108 (90 Base) MCG/ACT inhaler Inhale 2 puffs into the lungs every 6 (six) hours as needed for wheezing or shortness of breath.   Brexpiprazole 1 MG TABS Take 1 mg by mouth at bedtime.    busPIRone (BUSPAR) 5 MG tablet Take 5-10 mg by mouth 2 (two) times daily. 10 MG IN THE MORNING, 5 MG AT DINNER   CALCIUM CITRATE PO Take 600 mg by mouth daily.    clonazePAM (KLONOPIN) 0.5 MG tablet Take 1 tablet by mouth as needed in middle of the nite to go back to sleep   CRANBERRY PO Take 2 tablets by mouth daily.   D-Mannose 500 MG CAPS Take by mouth daily.   donepezil (ARICEPT) 10 MG tablet Take 1 tablet (10 mg total) by mouth at bedtime. Take 1  tablet at 10 mg daily   DULoxetine (CYMBALTA) 30 MG capsule Take 30 mg by mouth in the morning, at noon, and at bedtime.   ELIQUIS 5 MG TABS tablet TAKE 1 TABLET BY MOUTH TWICE A DAY   furosemide (LASIX) 20 MG tablet TAKE 1 TABLET BY MOUTH EVERY DAY   levothyroxine (SYNTHROID) 50 MCG tablet TAKE 1 TABLET BY MOUTH EVERY DAY BEFORE  BREAKFAST   mirtazapine (REMERON) 15 MG tablet Take 7.5 mg by mouth at bedtime.   Multiple Vitamins-Minerals (MULTIVITAMIN PO) Take 1 tablet by mouth daily.    oxybutynin (DITROPAN-XL) 10 MG 24 hr tablet Take 10 mg by mouth daily.   oxyCODONE-acetaminophen (PERCOCET) 5-325 MG tablet Take 1 tablet by mouth every 4 (four) hours as needed for severe pain.   potassium chloride SA (KLOR-CON M20) 20 MEQ tablet TAKE 1 TABLET BY MOUTH EVERY DAY   Probiotic Product (PROBIOTIC DAILY PO) Take 1 tablet by mouth daily. OTC   senna-docusate (SENOKOT-S) 8.6-50 MG tablet Take 1 tablet by mouth  2 (two) times daily as needed for mild constipation.   traZODone (DESYREL) 100 MG tablet Take 100 mg by mouth at bedtime.   vitamin B-12 (CYANOCOBALAMIN) 500 MCG tablet Take 500 mcg by mouth daily.   [DISCONTINUED] donepezil (ARICEPT) 10 MG tablet Take half tablet (5 mg) daily for 2 weeks, then increase to the full tablet at 10 mg daily   No facility-administered encounter medications on file as of 01/19/2022.    Review of Systems  Constitutional:  Negative for appetite change, chills, fatigue, fever and unexpected weight change.  Respiratory:  Negative for cough, chest tightness, shortness of breath and wheezing.   Cardiovascular:  Negative for chest pain, palpitations and leg swelling.  Gastrointestinal:  Positive for abdominal pain. Negative for abdominal distention, blood in stool, constipation, diarrhea, nausea and vomiting.       Lower abdominal pain   Genitourinary:  Positive for frequency. Negative for difficulty urinating, dysuria, flank pain and urgency.  Musculoskeletal:  Positive for arthralgias, back pain and gait problem. Negative for joint swelling, myalgias, neck pain and neck stiffness.  Neurological:  Negative for dizziness, speech difficulty, weakness, light-headedness, numbness and headaches.  Psychiatric/Behavioral:  Negative for agitation, behavioral problems, confusion, hallucinations and sleep  disturbance. The patient is not nervous/anxious.        Memory loss    Immunization History  Administered Date(s) Administered   Fluad Quad(high Dose 65+) 07/22/2020   Influenza Split 08/14/2013   Influenza Whole 08/20/2009   Influenza, High Dose Seasonal PF 09/11/2017, 07/25/2018, 09/14/2021   Influenza,inj,Quad PF,6+ Mos 08/04/2014, 09/06/2015, 07/10/2016   PFIZER Comirnaty(Gray Top)Covid-19 Tri-Sucrose Vaccine 02/23/2021   PFIZER(Purple Top)SARS-COV-2 Vaccination 12/27/2019, 01/17/2020, 08/30/2020, 09/14/2021   Pneumococcal Conjugate-13 01/14/2015   Pneumococcal Polysaccharide-23 07/11/2013   Td 11/20/2005, 11/27/2016   Pertinent  Health Maintenance Due  Topic Date Due   OPHTHALMOLOGY EXAM  02/16/2021   FOOT EXAM  07/22/2021   HEMOGLOBIN A1C  05/24/2022   URINE MICROALBUMIN  11/24/2022   INFLUENZA VACCINE  Completed   DEXA SCAN  Completed   Fall Risk 10/17/2021 11/24/2021 12/27/2021 01/18/2022 01/18/2022  Falls in the past year? 0 0 0 0 0  Number of falls in past year - - - - -  Was there an injury with Fall? 0 0 0 0 0  Was there an injury with Fall? - - - - -  Fall Risk Category Calculator 0 0 0 0 0  Fall Risk Category Low Low Low Low Low  Patient Fall Risk Level High fall risk Low fall risk Low fall risk High fall risk Low fall risk  Patient at Risk for Falls Due to - No Fall Risks No Fall Risks - No Fall Risks  Fall risk Follow up - Falls evaluation completed Falls evaluation completed - Falls evaluation completed   Functional Status Survey:    Vitals:   01/19/22 1425  BP: 130/80  Pulse: 63  Resp: 16  Temp: (!) 97.5 F (36.4 C)  SpO2: 93%  Weight: 201 lb 9.6 oz (91.4 kg)  Height: 5\' 2"  (1.575 m)   Body mass index is 36.87 kg/m. Physical Exam Vitals reviewed.  Constitutional:      General: She is not in acute distress.    Appearance: Normal appearance. She is normal weight. She is not ill-appearing or diaphoretic.  Eyes:     General: No scleral icterus.        Right eye: No discharge.        Left eye: No discharge.  Conjunctiva/sclera: Conjunctivae normal.     Pupils: Pupils are equal, round, and reactive to light.  Cardiovascular:     Rate and Rhythm: Normal rate and regular rhythm.     Pulses: Normal pulses.     Heart sounds: Normal heart sounds. No murmur heard.   No friction rub. No gallop.  Pulmonary:     Effort: Pulmonary effort is normal. No respiratory distress.     Breath sounds: Normal breath sounds. No wheezing, rhonchi or rales.  Chest:     Chest wall: No tenderness.  Abdominal:     General: Bowel sounds are normal. There is no distension.     Palpations: Abdomen is soft. There is no mass.     Tenderness: There is no right CVA tenderness, left CVA tenderness, guarding or rebound.     Comments: Supra pubic tenderness   Musculoskeletal:        General: No swelling or tenderness.     Right lower leg: No edema.     Left lower leg: No edema.     Comments: On wheelchair during visit   Skin:    General: Skin is warm and dry.     Coloration: Skin is not pale.     Findings: No bruising, erythema, lesion or rash.  Neurological:     Mental Status: She is alert and oriented to person, place, and time.     Cranial Nerves: No cranial nerve deficit.     Sensory: No sensory deficit.     Motor: No weakness.     Coordination: Coordination normal.     Gait: Gait abnormal.  Psychiatric:        Mood and Affect: Mood normal.        Speech: Speech normal.        Behavior: Behavior normal.        Thought Content: Thought content normal.        Cognition and Memory: Memory is impaired.        Judgment: Judgment normal.    Labs reviewed: Recent Labs    01/20/21 0000 05/24/21 1149 11/25/21 1502  NA 138 139 136  K 4.2 4.6 4.3  CL 101 102 100  CO2 29 26 26   GLUCOSE 98 96 126*  BUN 24 20 26*  CREATININE 0.94* 0.93* 1.14  CALCIUM 8.9 9.5 9.1   Recent Labs    01/20/21 0000 05/24/21 1149 11/25/21 1502  AST 13 12 14   ALT 6 6 8    ALKPHOS  --   --  63  BILITOT 0.3 0.4 0.3  PROT 6.3 6.2 6.2  ALBUMIN  --   --  3.7   Recent Labs    01/20/21 0000 05/24/21 1149  WBC 6.0 6.6  NEUTROABS 4,092 4,706  HGB 10.7* 11.4*  HCT 33.3* 37.0  MCV 82.0 83.5  PLT 307 302   Lab Results  Component Value Date   TSH 1.26 05/24/2021   Lab Results  Component Value Date   HGBA1C 5.2 11/24/2021   Lab Results  Component Value Date   CHOL 172 10/16/2018   HDL 56 10/16/2018   LDLCALC 97 10/16/2018   TRIG 98 10/16/2018   CHOLHDL 3.1 10/16/2018    Significant Diagnostic Results in last 30 days:  EEG adult  Result Date: 12/22/2021 Cameron Sprang, MD     12/28/2021  3:58 PM ELECTROENCEPHALOGRAM REPORT Date of Study: 12/22/2021 Patient's Name: Andrea [name] MRN: [id number] Date of Birth: October 17, 1935 Referring Provider: Sharene Butters, PA-C Clinical  History: This is an 87 year old woman with episodes of jerking. EEG for classification. Medications: KEPPRA 500 mg VENTOLIN HFA 108 (90 Base) MCG/ACT inhaler REXULTI 1 mg BUSPAR 5-10 mg CALCIUM CITRATE PO 600 mg CIPRO 500 mg KLONOPIN 0.5 MG tablet CRANBERRY PO D-Mannose 500 MG CAPS CYMBALTA 30 mg ELIQUIS 5 MG TABS tablet LASIX 20 MG tablet SYNTHROID 50 MCG tablet REMERON  7.5 mg MULTIVITAMIN PO DITROPAN-XL PERCOCET 5-325 MG tablet KLOR-CON M20 20 MEQ tablet PROBIOTIC DAILY PO SENOKOT-S 8.6-50 MG tablet DESYREL 100 mg CYANOCOBALAMIN 500 mcg Technical Summary: A multichannel digital EEG recording measured by the international 10-20 system with electrodes applied with paste and impedances below 5000 ohms performed in our laboratory with EKG monitoring in an awake and asleep patient.  Hyperventilation was not performed. Photic stimulation was performed.  The digital EEG was referentially recorded, reformatted, and digitally filtered in a variety of bipolar and referential montages for optimal display.  Description: The patient is awake and asleep during the recording.  During maximal wakefulness, there is  a symmetric, medium voltage 8 Hz posterior dominant rhythm that attenuates with eye opening.  The record is symmetric.  During drowsiness and sleep, there is an increase in theta and delta slowing of the background with vertex waves seen. Photic stimulation did not elicit any abnormalities.  There were no epileptiform discharges or electrographic seizures seen.  EKG lead was unremarkable. Impression: This awake and asleep EEG is normal.  Clinical Correlation: A normal EEG does not exclude a clinical diagnosis of epilepsy.  If further clinical questions remain, prolonged EEG may be helpful.  Clinical correlation is advised. Ellouise Newer, M.D.    Assessment/Plan 1. Urinary frequency Afebrile  - POC Urinalysis Dipstick indicates yellow cloudy urine with moderate blood,Nitrites positive and large 3 + leukocytes  Discussed results with patient and care giver option given to wait for final urine culture or start on ABX prefers to start on ABX while waiting for urine culture. Start on Cipro. Has taken Cipro in the past without any complication.  - Urine Culture - ciprofloxacin (CIPRO) 500 MG tablet; Take 1 tablet (500 mg total) by mouth 2 (two) times daily for 7 days.  Dispense: 14 tablet; Refill: 0  2. Lower abdominal pain Suprapubic tenderness.  Start on  - ciprofloxacin (CIPRO) 500 MG tablet; Take 1 tablet (500 mg total) by mouth 2 (two) times daily for 7 days.  Dispense: 14 tablet; Refill: 0  Family/ staff Communication: Reviewed plan of care with patient and Care giver   Labs/tests ordered:  - POC Urinalysis Dipstick  - Urine Culture  Next Appointment: As needed if symptoms worsen or fail to improve    Sandrea Hughs, NP

## 2022-01-20 DIAGNOSIS — I739 Peripheral vascular disease, unspecified: Secondary | ICD-10-CM | POA: Diagnosis not present

## 2022-01-20 DIAGNOSIS — L603 Nail dystrophy: Secondary | ICD-10-CM | POA: Diagnosis not present

## 2022-01-21 LAB — URINE CULTURE
MICRO NUMBER:: 13081072
SPECIMEN QUALITY:: ADEQUATE

## 2022-01-25 DIAGNOSIS — F341 Dysthymic disorder: Secondary | ICD-10-CM | POA: Diagnosis not present

## 2022-01-25 DIAGNOSIS — F411 Generalized anxiety disorder: Secondary | ICD-10-CM | POA: Diagnosis not present

## 2022-01-25 DIAGNOSIS — F332 Major depressive disorder, recurrent severe without psychotic features: Secondary | ICD-10-CM | POA: Diagnosis not present

## 2022-01-27 DIAGNOSIS — M6281 Muscle weakness (generalized): Secondary | ICD-10-CM | POA: Diagnosis not present

## 2022-01-27 DIAGNOSIS — R262 Difficulty in walking, not elsewhere classified: Secondary | ICD-10-CM | POA: Diagnosis not present

## 2022-01-31 DIAGNOSIS — M6281 Muscle weakness (generalized): Secondary | ICD-10-CM | POA: Diagnosis not present

## 2022-01-31 DIAGNOSIS — R262 Difficulty in walking, not elsewhere classified: Secondary | ICD-10-CM | POA: Diagnosis not present

## 2022-02-01 DIAGNOSIS — N3946 Mixed incontinence: Secondary | ICD-10-CM | POA: Diagnosis not present

## 2022-02-02 ENCOUNTER — Other Ambulatory Visit: Payer: Self-pay | Admitting: Family

## 2022-02-02 DIAGNOSIS — R262 Difficulty in walking, not elsewhere classified: Secondary | ICD-10-CM | POA: Diagnosis not present

## 2022-02-02 DIAGNOSIS — M6281 Muscle weakness (generalized): Secondary | ICD-10-CM | POA: Diagnosis not present

## 2022-02-03 DIAGNOSIS — R262 Difficulty in walking, not elsewhere classified: Secondary | ICD-10-CM | POA: Diagnosis not present

## 2022-02-03 DIAGNOSIS — M6281 Muscle weakness (generalized): Secondary | ICD-10-CM | POA: Diagnosis not present

## 2022-02-06 ENCOUNTER — Other Ambulatory Visit: Payer: Self-pay

## 2022-02-06 DIAGNOSIS — Z8781 Personal history of (healed) traumatic fracture: Secondary | ICD-10-CM

## 2022-02-06 MED ORDER — OXYCODONE-ACETAMINOPHEN 5-325 MG PO TABS: 1.0000 | ORAL_TABLET | ORAL | 0 refills | Status: DC | PRN

## 2022-02-06 NOTE — Telephone Encounter (Signed)
Patient's daughter called requesting refill on patient's oxycodone 5/325 mg tablet one every 4 hours as needed for severe pain. Opiod contract on file and up to date. ? ?Medication pended and sent to Marlowe Sax, NP for approval. ?

## 2022-02-07 DIAGNOSIS — M6281 Muscle weakness (generalized): Secondary | ICD-10-CM | POA: Diagnosis not present

## 2022-02-08 DIAGNOSIS — M6281 Muscle weakness (generalized): Secondary | ICD-10-CM | POA: Diagnosis not present

## 2022-02-08 DIAGNOSIS — R262 Difficulty in walking, not elsewhere classified: Secondary | ICD-10-CM | POA: Diagnosis not present

## 2022-02-14 DIAGNOSIS — M6281 Muscle weakness (generalized): Secondary | ICD-10-CM | POA: Diagnosis not present

## 2022-02-14 DIAGNOSIS — R262 Difficulty in walking, not elsewhere classified: Secondary | ICD-10-CM | POA: Diagnosis not present

## 2022-02-16 DIAGNOSIS — N390 Urinary tract infection, site not specified: Secondary | ICD-10-CM | POA: Diagnosis not present

## 2022-02-16 DIAGNOSIS — Z20822 Contact with and (suspected) exposure to covid-19: Secondary | ICD-10-CM | POA: Diagnosis not present

## 2022-02-16 DIAGNOSIS — N3000 Acute cystitis without hematuria: Secondary | ICD-10-CM | POA: Diagnosis not present

## 2022-02-16 DIAGNOSIS — B957 Other staphylococcus as the cause of diseases classified elsewhere: Secondary | ICD-10-CM | POA: Diagnosis not present

## 2022-02-20 ENCOUNTER — Ambulatory Visit (INDEPENDENT_AMBULATORY_CARE_PROVIDER_SITE_OTHER): Payer: Medicare Other | Admitting: Family

## 2022-02-20 VITALS — BP 116/80 | HR 61 | Temp 97.2°F | Resp 18 | Ht 62.0 in | Wt 205.0 lb

## 2022-02-20 DIAGNOSIS — R5383 Other fatigue: Secondary | ICD-10-CM

## 2022-02-20 DIAGNOSIS — E538 Deficiency of other specified B group vitamins: Secondary | ICD-10-CM | POA: Diagnosis not present

## 2022-02-20 DIAGNOSIS — R4182 Altered mental status, unspecified: Secondary | ICD-10-CM | POA: Diagnosis not present

## 2022-02-20 NOTE — Progress Notes (Signed)
? ?Provider: Marlowe Sax FNP-C ? ?Allison Silva, Nelda Bucks, NP ? ?Patient Care Team: ?Dariona Postma, Nelda Bucks, NP as PCP - General (Family Medicine) ?Tanda Rockers, MD as Consulting Physician (Pulmonary Disease) ?Deveron Furlong, NP as Nurse Practitioner (Psychiatry) ? ?Extended Emergency Contact Information ?Primary Emergency Contact: Warrick,Roberta(Bobbi) ?Address: [address] ?         [place], [place] [address] [place] ?Home Phone: [phone] ?Mobile Phone: [phone] ?Relation: Daughter ?Secondary Emergency Contact: Wilhelmi,David ?Address: [address]  ?         [place], [place] [address] [place] ?Home Phone: [phone] ?Work Phone: [phone] ?Relation: Son ? ?Code Status:  DNR ?Goals of care: Advanced Directive information ? ?  01/18/2022  ?  4:50 PM  ?Advanced Directives  ?Does Patient Have a Medical Advance Directive? Yes  ?Type of Advance Directive Out of facility DNR (pink MOST or yellow form)  ?Does patient want to make changes to medical advance directive? No - Patient declined  ? ? ? ?Chief Complaint  ?Patient presents with  ? Fatigue  ? Altered Mental Status  ? Labs Only  ? ? ?HPI:  ?Pt is a 87 y.o. female seen today for an acute visit for evaluation of fatigue and altered mental status x 2 weeks.Has not been eating much during the past week says she not hungry but when food is served she eats.  She was seen by urologist recently and urine specimen was negative for urinary tract infection.  POA would like to have lab work done since patient has not been acting like her normal self.  Symptoms times yes states starring.  Has had psych behaviors in the past with hallucination and possible dementia decline.States having some depression. Also has significant medical history of bipolar ? ? ?Past Medical History:  ?Diagnosis Date  ? A-fib ([place])   ? Anemia   ? Anxiety   ? Arthritis   ? hands, spine   ? Bladder incontinence   ? Blood transfusion   ? Cancer ([facility]) 02/2019  ? left  breast cancer  ? Cervicalgia   ? Constipation   ? Cough   ? Wert-onset 08/2009, as of 2014- resolved   ? Deep venous thrombosis ([facility])   ? post op, rec'd/needed  blood thinner   ? Depression   ? Depression with anxiety   ? Dysrhythmia   ? a-fib  ? Falls frequently   ? pt. reports that she was falling 3 times a day, has had PT at a facility for a while & now is getting home PT 2-3 times/ week   ? Headache(784.0) 09/27/2011  ? History of stress test   ? 15-20 yrs.ago   ? Hypotension   ? Hypothyroidism   ? Lumbar radiculopathy 09/26/2011  ? Pneumonia   ? never been in hosp. for pneumonia   ? Prediabetes   ? Right hand fracture   ? Monday  ? Shortness of breath   ? Sleep apnea   ? does not use consistently  ? ?Past Surgical History:  ?Procedure Laterality Date  ? ANTERIOR CERVICAL DECOMP/DISCECTOMY FUSION N/A 07/09/2013  ? Procedure: ANTERIOR CERVICAL DECOMPRESSION/DISCECTOMY FUSION 3 LEVELS;  Surgeon: Sinclair Ship, MD;  Location: [facility];  Service: Orthopedics;  Laterality: N/A;  Anterior cervical decompression fusion, cervical 3-4, cervical 4-5-, cervical 5-6 with instrumentation, allograft.  ? APPENDECTOMY    ? BACK SURGERY    ? 2000  ? BREAST EXCISIONAL BIOPSY    ? breast bxs  on left x 2, breast bx of right x 1-all benign  ? EYE SURGERY    ? cataracts bilateral /w IOL  ? HERNIA REPAIR    ? umbilical hernia- [id number]  ? IR IMAGING GUIDED PORT INSERTION  04/28/2019  ? JOINT REPLACEMENT  2006,2007  ? bilateral  ? LAPAROSCOPIC OVARIAN CYSTECTOMY    ? not done by laparscopy-abdominal incision  ? LEG SURGERY    ? ORIF PERIPROSTHETIC FRACTURE Right 07/26/2019  ? Procedure: OPEN REDUCTION INTERNAL FIXATION (ORIF) RIGHT FEMUR PERIPROSTHETIC FRACTURE;  Surgeon: Rod Can, MD;  Location: [facility];  Service: Orthopedics;  Laterality: Right;  ? PORTACATH PLACEMENT Right 04/01/2019  ? Procedure: ATTEMPTED INSERTION PORT-A-CATH WITH ULTRASOUND;  Surgeon: [name], MD;  Location: [facility];  Service: General;   Laterality: Right;  ? RADIOACTIVE SEED GUIDED PARTIAL MASTECTOMY WITH AXILLARY SENTINEL LYMPH NODE BIOPSY Left 04/01/2019  ? Procedure: LEFT BREAST PARTIAL MASTECTOMY WITH RADIOACTIVE SEED AND LEFT SENTINEL LYMPH NODE BIOPSY;  Surgeon: [name], MD;  Location: [facility];  Service: General;  Laterality: Left;  ? RE-EXCISION OF BREAST CANCER,SUPERIOR MARGINS Left 04/15/2019  ? Procedure: RE-EXCISION OF LEFT BREAST CANCER POSITIVE MARGINS;  Surgeon: [name], MD;  Location: [facility];  Service: General;  Laterality: Left;  ? REPLACEMENT TOTAL KNEE BILATERAL    ? TONSILLECTOMY    ? ? ?Allergies  ?Allergen Reactions  ? Clindamycin Hcl Other (See Comments)  ?  REACTION: swelling, pt. Reports that she had a 16 lb. weightgain in one day  ? ? ?Outpatient Encounter Medications as of 02/20/2022  ?Medication Sig  ? albuterol (VENTOLIN HFA) 108 (90 Base) MCG/ACT inhaler Inhale 2 puffs into the lungs every 6 (six) hours as needed for wheezing or shortness of breath.  ? Brexpiprazole 1 MG TABS Take 1 mg by mouth at bedtime.   ? busPIRone (BUSPAR) 5 MG tablet Take 5-10 mg by mouth 2 (two) times daily. 10 MG IN THE MORNING, 5 MG AT DINNER  ? CALCIUM CITRATE PO Take 600 mg by mouth daily.   ? clonazePAM (KLONOPIN) 0.5 MG tablet Take 1 tablet by mouth as needed in middle of the nite to go back to sleep  ? CRANBERRY PO Take 2 tablets by mouth daily.  ? D-Mannose 500 MG CAPS Take by mouth daily.  ? donepezil (ARICEPT) 10 MG tablet Take 1 tablet (10 mg total) by mouth at bedtime. Take 1  tablet at 10 mg daily  ? DULoxetine (CYMBALTA) 30 MG capsule Take 30 mg by mouth in the morning, at noon, and at bedtime.  ? ELIQUIS 5 MG TABS tablet TAKE 1 TABLET BY MOUTH TWICE A DAY  ? furosemide (LASIX) 20 MG tablet TAKE 1 TABLET BY MOUTH EVERY DAY  ? levothyroxine (SYNTHROID) 50 MCG tablet Take 1 tablet (50 mcg total) by mouth daily before breakfast. Dx E03.9  ? mirtazapine (REMERON) 15 MG tablet Take  7.5 mg by mouth at bedtime.  ? Multiple Vitamins-Minerals (MULTIVITAMIN PO) Take 1 tablet by mouth daily.   ? oxybutynin (DITROPAN-XL) 10 MG 24 hr tablet Take 10 mg by mouth daily.  ? oxyCODONE-acetaminophen (PERCOCET) 5-325 MG tablet Take 1 tablet by mouth every 4 (four) hours as needed for severe pain.  ? potassium chloride SA (KLOR-CON M20) 20 MEQ tablet TAKE 1 TABLET BY MOUTH EVERY DAY  ? Probiotic Product (PROBIOTIC DAILY PO) Take 1 tablet by mouth daily. OTC  ? senna-docusate (SENOKOT-S) 8.6-50 MG tablet Take 1 tablet  by mouth 2 (two) times daily as needed for mild constipation.  ? traZODone (DESYREL) 100 MG tablet Take 100 mg by mouth at bedtime.  ? vitamin B-12 (CYANOCOBALAMIN) 500 MCG tablet Take 500 mcg by mouth daily.  ? ?No facility-administered encounter medications on file as of 02/20/2022.  ? ? ?Review of Systems  ?Constitutional:  Negative for appetite change, chills, fatigue, fever and unexpected weight change.  ?HENT:  Negative for congestion, dental problem, ear discharge, ear pain, facial swelling, hearing loss, nosebleeds, postnasal drip, rhinorrhea, sinus pressure, sinus pain, sneezing, sore throat, tinnitus and trouble swallowing.   ?Eyes:  Negative for pain, discharge, redness, itching and visual disturbance.  ?Respiratory:  Negative for cough, chest tightness, shortness of breath and wheezing.   ?Cardiovascular:  Negative for chest pain, palpitations and leg swelling.  ?Gastrointestinal:  Negative for abdominal distention, abdominal pain, blood in stool, constipation, diarrhea, nausea and vomiting.  ?Endocrine: Negative for cold intolerance, heat intolerance, polydipsia, polyphagia and polyuria.  ?Genitourinary:  Negative for difficulty urinating, dysuria, flank pain, frequency and urgency.  ?Musculoskeletal:  Positive for arthralgias and gait problem. Negative for back pain, joint swelling, myalgias, neck pain and neck stiffness.  ?Skin:  Negative for color change, pallor, rash and wound.   ?Neurological:  Negative for dizziness, syncope, speech difficulty, weakness, light-headedness, numbness and headaches.  ?Hematological:  Does not bruise/bleed easily.  ?Psychiatric/Behavioral:  Negative for

## 2022-02-21 LAB — CBC WITH DIFFERENTIAL/PLATELET
Absolute Monocytes: 569 cells/uL (ref 200–950)
Basophils Absolute: 47 cells/uL (ref 0–200)
Basophils Relative: 0.6 %
Eosinophils Absolute: 182 cells/uL (ref 15–500)
Eosinophils Relative: 2.3 %
HCT: 37.1 % (ref 35.0–45.0)
Hemoglobin: 11.8 g/dL (ref 11.7–15.5)
Lymphs Abs: 1477 cells/uL (ref 850–3900)
MCH: 27.1 pg (ref 27.0–33.0)
MCHC: 31.8 g/dL — ABNORMAL LOW (ref 32.0–36.0)
MCV: 85.1 fL (ref 80.0–100.0)
MPV: 9.9 fL (ref 7.5–12.5)
Monocytes Relative: 7.2 %
Neutro Abs: 5625 cells/uL (ref 1500–7800)
Neutrophils Relative %: 71.2 %
Platelets: 279 10*3/uL (ref 140–400)
RBC: 4.36 10*6/uL (ref 3.80–5.10)
RDW: 14 % (ref 11.0–15.0)
Total Lymphocyte: 18.7 %
WBC: 7.9 10*3/uL (ref 3.8–10.8)

## 2022-02-21 LAB — COMPLETE METABOLIC PANEL WITH GFR
AG Ratio: 1.4 (calc) (ref 1.0–2.5)
ALT: 9 U/L (ref 6–29)
AST: 15 U/L (ref 10–35)
Albumin: 4 g/dL (ref 3.6–5.1)
Alkaline phosphatase (APISO): 66 U/L (ref 37–153)
BUN/Creatinine Ratio: 28 (calc) — ABNORMAL HIGH (ref 6–22)
BUN: 27 mg/dL — ABNORMAL HIGH (ref 7–25)
CO2: 28 mmol/L (ref 20–32)
Calcium: 9.6 mg/dL (ref 8.6–10.4)
Chloride: 104 mmol/L (ref 98–110)
Creat: 0.98 mg/dL — ABNORMAL HIGH (ref 0.60–0.95)
Globulin: 2.8 g/dL (calc) (ref 1.9–3.7)
Glucose, Bld: 100 mg/dL (ref 65–139)
Potassium: 4.2 mmol/L (ref 3.5–5.3)
Sodium: 143 mmol/L (ref 135–146)
Total Bilirubin: 0.4 mg/dL (ref 0.2–1.2)
Total Protein: 6.8 g/dL (ref 6.1–8.1)
eGFR: 57 mL/min/{1.73_m2} — ABNORMAL LOW (ref >=60)

## 2022-02-21 LAB — VITAMIN B12: Vitamin B-12: 1202 pg/mL — ABNORMAL HIGH (ref 200–1100)

## 2022-02-23 DIAGNOSIS — M6281 Muscle weakness (generalized): Secondary | ICD-10-CM | POA: Diagnosis not present

## 2022-02-23 DIAGNOSIS — R262 Difficulty in walking, not elsewhere classified: Secondary | ICD-10-CM | POA: Diagnosis not present

## 2022-02-28 DIAGNOSIS — R262 Difficulty in walking, not elsewhere classified: Secondary | ICD-10-CM | POA: Diagnosis not present

## 2022-02-28 DIAGNOSIS — M6281 Muscle weakness (generalized): Secondary | ICD-10-CM | POA: Diagnosis not present

## 2022-03-02 DIAGNOSIS — R262 Difficulty in walking, not elsewhere classified: Secondary | ICD-10-CM | POA: Diagnosis not present

## 2022-03-02 DIAGNOSIS — M6281 Muscle weakness (generalized): Secondary | ICD-10-CM | POA: Diagnosis not present

## 2022-03-06 DIAGNOSIS — Z20822 Contact with and (suspected) exposure to covid-19: Secondary | ICD-10-CM | POA: Diagnosis not present

## 2022-03-08 ENCOUNTER — Ambulatory Visit (INDEPENDENT_AMBULATORY_CARE_PROVIDER_SITE_OTHER): Payer: Medicare Other | Admitting: Physician Assistant

## 2022-03-08 ENCOUNTER — Encounter: Payer: Self-pay | Admitting: Physician Assistant

## 2022-03-08 VITALS — Resp 18 | Ht 62.0 in | Wt 205.0 lb

## 2022-03-08 DIAGNOSIS — F01518 Vascular dementia, unspecified severity, with other behavioral disturbance: Secondary | ICD-10-CM | POA: Diagnosis not present

## 2022-03-08 DIAGNOSIS — F341 Dysthymic disorder: Secondary | ICD-10-CM | POA: Diagnosis not present

## 2022-03-08 NOTE — Progress Notes (Signed)
? ?Assessment/Plan:  ? ? ?  ?Dementia with  behavioral disturbance likely mixed vascular and Alzheimer's disease ? ?Patient had an event of confusion due to UTI in early March, resolved with antibiotics.  Since that time, the patient may have experience worsening of her depression.  There is no signs during this visit that the patient is showing any indication of worsening dementia.  Delayed recall 1/3.  She is able to follow simple commands.  Most of the answers are, as before in the form of "I do not know ".  No new findings are noted. ? ? Recommendations:  ?Discussed safety both in and out of the home.  ?Discussed the importance of regular daily schedule with inclusion of crossword puzzles to maintain brain function.  ?Continue to monitor mood by psychiatry, may need to adjust some of her medications for mood improvement  ?stay active at least 30 minutes at least 3 times a week.  ?Naps should be scheduled and should be no longer than 60 minutes and should not occur after 2 PM.  ?Mediterranean diet is recommended  ?Control cardiovascular risk factors  ?Continue donepezil 10 mg daily Side effects were discussed ?Maintain yourself hydrated, to avoid UTIs. ?Follow up in  6 months. ? ? ?Case discussed with Dr. Delice Lesch who agrees with the plan ? ? ? ? ?Subjective:  ? ? ?Andrea [name] is a very pleasant 87 y.o. RH female  seen today in follow up for memory loss. This patient is accompanied in the office by her caregiver who supplements the history.  Previous records as well as any outside records available were reviewed prior to todays visit.  Patient was last seen at our office on 01/18/22. Last  MMSE  08/2021 was 13/30. Patient is currently on donepezil 10 mg daily tolerating well.  Since her last visit, the patient sustained UTI that "to 20 days to resolve ".  This required evaluation by urology.  She was not confused during the period of time.  After the treatment, the patient began to show signs of depression,  without stroke, or increased confusion.  She has moments of crying spells.  She wants to sleep more, but she already was experiencing this during her prior visit..  She denies any fever, chills, night sweats, headaches, nausea or vomiting.  She denies any abdominal pain at this time.  The patient is still doing PT regularly to improve her mobility.  Her caretaker Jocelyn Lamer reports that her memory is about the same.  She continues to repeat herself and asked the same questions, and denies leaving objects in unusual places.  Patient lives with her daughter.  Occasionally, she has hallucination, seeing "folks coming to visit me ".  She does not enjoy social interaction, "never has been too social ".She no longer drives.  Denies paranoia.  She uses a walker to ambulate, and a wheelchair at times.  If she does not have the walker in front of her, she will have a panic episode, breathing heavier.  The caregiver is not aware of any hygiene concerns, she needs assistance with bathing and dressing.  The caregiver is in charge of the medications, and the finances are taking care of by the daughter.  Her appetite is good, denies trouble swallowing.  She no longer cooks. Denies headaches, double vision, dizziness, focal numbness or tingling, unilateral weakness or tremors or anosmia. No history of seizures.  As mentioned above, she has frequent UTIs, wears pads and pull ups, retention, denies constipation or  diarrhea. She has OSA, but lately has not been using the CPAP, because "makes me cold ".    ? ?Labs 05/2021 TSH 1.26, B12 1272 ? ?Initial visit 10/17/2021 the patient is seen in neurologic consultation at the request of Ngetich, Dinah C, NP for the evaluation of memory loss.  The patient is accompanied by her caregiver who supplements the history.This is a 87 y.o. year old female who has had worsening memory issues for about few months when she would get UTIs ( 5 bouts of them ).  However, the caregiver reports that her memory  changes may have been present for at least 1-1/2-year, when she appears to be more disoriented, and has more trouble to remember new information, or people's names, or "mixes them ".  For example, she has a son who lives in [place], she cannot remember his name.  She denies repeating the same stories or asking the same questions.  However, she has been forgetting for example, how to sit in a chair or which way she needs to go when she is going from one room to another.  The patient states that "it may have been there for a longer time than that, but I did not want to tell anyone".  She denies leaving objects in unusual places.  She lives with her daughter.  Her mood is usually good, only when the caregiver is not there, she may show more concern, and feel "more down".  When the caregiver is there, she has more social interaction.  She no longer drives.  She sleeps well, "a lot more than before ".  She denies any vivid dreams or sleepwalking.  No hallucinations or paranoia.  She uses a walker to ambulate, and a wheelchair at times.  In the past, she tried physical therapy 2-3 times a week, but she is no longer interested, "she did not like it ".  No recent falls or head injuries.  The caregiver is not aware of any hygiene concerns, she needs assistance with bathing and dressing.  The caregiver is in charge of the medications, and the finances are taking care of by the daughter.  Her appetite is good, denies trouble swallowing.  She no longer cooks. ?Denies headaches, double vision, dizziness, focal numbness or tingling, unilateral weakness or tremors or anosmia. No history of seizures.  As mentioned above, she has frequent UTIs, wears pads and pull ups , retention, denies constipation or diarrhea.  She denies anosmia, she did not have any recent COVID.  She has OSA, but lately has not been using the CPAP, because "makes me cold ".  She denies any alcohol or tobacco abuse.  Family history negative for Alzheimer's  disease or any other type of dementia. ?  ?Labs 05/2021 TSH 1.26, B12 1272 ? ?PREVIOUS MEDICATIONS:  ? ?CURRENT MEDICATIONS:  ?Outpatient Encounter Medications as of 03/08/2022  ?Medication Sig  ? albuterol (VENTOLIN HFA) 108 (90 Base) MCG/ACT inhaler Inhale 2 puffs into the lungs every 6 (six) hours as needed for wheezing or shortness of breath.  ? Brexpiprazole 1 MG TABS Take 1 mg by mouth at bedtime.   ? busPIRone (BUSPAR) 5 MG tablet Take 5-10 mg by mouth 2 (two) times daily. 10 MG IN THE MORNING, 5 MG AT DINNER  ? CALCIUM CITRATE PO Take 600 mg by mouth daily.   ? clonazePAM (KLONOPIN) 0.5 MG tablet Take 1 tablet by mouth as needed in middle of the nite to go back to sleep  ? CRANBERRY  PO Take 2 tablets by mouth daily.  ? D-Mannose 500 MG CAPS Take by mouth daily.  ? donepezil (ARICEPT) 10 MG tablet Take 1 tablet (10 mg total) by mouth at bedtime. Take 1  tablet at 10 mg daily  ? DULoxetine (CYMBALTA) 30 MG capsule Take 30 mg by mouth in the morning, at noon, and at bedtime.  ? ELIQUIS 5 MG TABS tablet TAKE 1 TABLET BY MOUTH TWICE A DAY  ? furosemide (LASIX) 20 MG tablet TAKE 1 TABLET BY MOUTH EVERY DAY  ? levothyroxine (SYNTHROID) 50 MCG tablet Take 1 tablet (50 mcg total) by mouth daily before breakfast. Dx E03.9  ? mirtazapine (REMERON) 15 MG tablet Take 7.5 mg by mouth at bedtime.  ? Multiple Vitamins-Minerals (MULTIVITAMIN PO) Take 1 tablet by mouth daily.   ? oxybutynin (DITROPAN-XL) 10 MG 24 hr tablet Take 10 mg by mouth daily.  ? oxyCODONE-acetaminophen (PERCOCET) 5-325 MG tablet Take 1 tablet by mouth every 4 (four) hours as needed for severe pain.  ? potassium chloride SA (KLOR-CON M20) 20 MEQ tablet TAKE 1 TABLET BY MOUTH EVERY DAY  ? Probiotic Product (PROBIOTIC DAILY PO) Take 1 tablet by mouth daily. OTC  ? senna-docusate (SENOKOT-S) 8.6-50 MG tablet Take 1 tablet by mouth 2 (two) times daily as needed for mild constipation.  ? traZODone (DESYREL) 100 MG tablet Take 100 mg by mouth at bedtime.  ?  vitamin B-12 (CYANOCOBALAMIN) 500 MCG tablet Take 500 mcg by mouth daily.  ? ?No facility-administered encounter medications on file as of 03/08/2022.  ? ? ? ?Objective:  ?  ? ?PHYSICAL EXAMINATION:   ? ?VITALS:

## 2022-03-08 NOTE — Patient Instructions (Signed)
It was a pleasure to see you today at our office.  ? ?Recommendations: ? ?Continue Aricept 10 mg daily.  ?Follow up as scheduled  ?Consider moving the appt with the Psychiatrist to evaluate for depression and possible med adjustment  ?Continue to hydrate to avoid UTIs  ? ? ?RECOMMENDATIONS FOR ALL PATIENTS WITH MEMORY PROBLEMS: ?1. Continue to exercise (Recommend 30 minutes of walking everyday, or 3 hours every week) ?2. Increase social interactions - continue going to [place] and enjoy social gatherings with friends and family ?3. Eat healthy, avoid fried foods and eat more fruits and vegetables ?4. Maintain adequate blood pressure, blood sugar, and blood cholesterol level. Reducing the risk of stroke and cardiovascular disease also helps promoting better memory. ?5. Avoid stressful situations. Live a simple life and avoid aggravations. Organize your time and prepare for the next day in anticipation. ?6. Sleep well, avoid any interruptions of sleep and avoid any distractions in the bedroom that may interfere with adequate sleep quality ?7. Avoid sugar, avoid sweets as there is a strong link between excessive sugar intake, diabetes, and cognitive impairment ?We discussed the Mediterranean diet, which has been shown to help patients reduce the risk of progressive memory disorders and reduces cardiovascular risk. This includes eating fish, eat fruits and green leafy vegetables, nuts like almonds and hazelnuts, walnuts, and also use olive oil. Avoid fast foods and fried foods as much as possible. Avoid sweets and sugar as sugar use has been linked to worsening of memory function. ? ?There is always a concern of gradual progression of memory problems. If this is the case, then we may need to adjust level of care according to patient needs. Support, both to the patient and caregiver, should then be put into place.  ? ? ?FALL PRECAUTIONS: Be cautious when walking. Scan the area for obstacles that may increase the risk of  trips and falls. When getting up in the mornings, sit up at the edge of the bed for a few minutes before getting out of bed. Consider elevating the bed at the head end to avoid drop of blood pressure when getting up. Walk always in a well-lit room (use night lights in the walls). Avoid area rugs or power cords from appliances in the middle of the walkways. Use a walker or a cane if necessary and consider physical therapy for balance exercise. Get your eyesight checked regularly. ? ?FINANCIAL OVERSIGHT: Supervision, especially oversight when making financial decisions or transactions is also recommended. ? ?HOME SAFETY: Consider the safety of the kitchen when operating appliances like stoves, microwave oven, and blender. Consider having supervision and share cooking responsibilities until no longer able to participate in those. Accidents with firearms and other hazards in the house should be identified and addressed as well. ? ? ?ABILITY TO BE LEFT ALONE: If patient is unable to contact 911 operator, consider using Life[facility], or when the need is there, arrange for someone to stay with patients. Smoking is a fire hazard, consider supervision or cessation. Risk of wandering should be assessed by caregiver and if detected at any point, supervision and safe proof recommendations should be instituted. ? ?MEDICATION SUPERVISION: Inability to self-administer medication needs to be constantly addressed. Implement a mechanism to ensure safe administration of the medications. ? ?

## 2022-03-10 DIAGNOSIS — M6281 Muscle weakness (generalized): Secondary | ICD-10-CM | POA: Diagnosis not present

## 2022-03-10 DIAGNOSIS — R262 Difficulty in walking, not elsewhere classified: Secondary | ICD-10-CM | POA: Diagnosis not present

## 2022-03-14 DIAGNOSIS — M6281 Muscle weakness (generalized): Secondary | ICD-10-CM | POA: Diagnosis not present

## 2022-03-14 DIAGNOSIS — R262 Difficulty in walking, not elsewhere classified: Secondary | ICD-10-CM | POA: Diagnosis not present

## 2022-03-15 DIAGNOSIS — Z20822 Contact with and (suspected) exposure to covid-19: Secondary | ICD-10-CM | POA: Diagnosis not present

## 2022-03-21 DIAGNOSIS — M6281 Muscle weakness (generalized): Secondary | ICD-10-CM | POA: Diagnosis not present

## 2022-03-21 DIAGNOSIS — R262 Difficulty in walking, not elsewhere classified: Secondary | ICD-10-CM | POA: Diagnosis not present

## 2022-03-23 DIAGNOSIS — F332 Major depressive disorder, recurrent severe without psychotic features: Secondary | ICD-10-CM | POA: Diagnosis not present

## 2022-03-23 DIAGNOSIS — F341 Dysthymic disorder: Secondary | ICD-10-CM | POA: Diagnosis not present

## 2022-03-23 DIAGNOSIS — F411 Generalized anxiety disorder: Secondary | ICD-10-CM | POA: Diagnosis not present

## 2022-03-26 ENCOUNTER — Other Ambulatory Visit: Payer: Self-pay | Admitting: Family

## 2022-03-26 DIAGNOSIS — Z7901 Long term (current) use of anticoagulants: Secondary | ICD-10-CM

## 2022-03-26 DIAGNOSIS — Z20822 Contact with and (suspected) exposure to covid-19: Secondary | ICD-10-CM | POA: Diagnosis not present

## 2022-03-27 ENCOUNTER — Telehealth: Payer: Self-pay | Admitting: *Deleted

## 2022-03-27 NOTE — Telephone Encounter (Signed)
Any particular time on Wednesday ?  ?

## 2022-03-27 NOTE — Telephone Encounter (Signed)
Deveron Furlong, NP with [facility] [phone] called wanting to speak directly with Andrea [name].  ? ?Stated that it is very Important because it is a situation that she is going to have to report to [employer]  ? ?Stated that she wants you to call her on Wednesday at [phone] to discuss concerns.  ?

## 2022-03-28 NOTE — Telephone Encounter (Signed)
Noted  

## 2022-03-28 NOTE — Telephone Encounter (Signed)
No, she said Wednesday would be the next day she would be working.  ?

## 2022-03-29 DIAGNOSIS — R262 Difficulty in walking, not elsewhere classified: Secondary | ICD-10-CM | POA: Diagnosis not present

## 2022-03-29 DIAGNOSIS — M6281 Muscle weakness (generalized): Secondary | ICD-10-CM | POA: Diagnosis not present

## 2022-03-30 NOTE — Patient Instructions (Signed)
Please contact your local pharmacy, previous provider, or insurance carrier for vaccine/immunization records. Ensure that any procedures done outside of [facility] are faxed to us [phone] or you can sign release of records form at the front desk to keep your medical record updated.   ?

## 2022-03-31 ENCOUNTER — Encounter: Payer: Self-pay | Admitting: Family

## 2022-03-31 ENCOUNTER — Ambulatory Visit (INDEPENDENT_AMBULATORY_CARE_PROVIDER_SITE_OTHER): Payer: Medicare Other | Admitting: Family

## 2022-03-31 VITALS — BP 120/70 | HR 74 | Temp 97.6°F | Resp 18 | Ht 62.0 in

## 2022-03-31 DIAGNOSIS — R2681 Unsteadiness on feet: Secondary | ICD-10-CM | POA: Diagnosis not present

## 2022-03-31 DIAGNOSIS — I480 Paroxysmal atrial fibrillation: Secondary | ICD-10-CM | POA: Diagnosis not present

## 2022-03-31 NOTE — Progress Notes (Signed)
? ?Provider: Marlowe Sax FNP-C ? ?Iszabella Hebenstreit, Nelda Bucks, NP ? ?Patient Care Team: ?Annaston Upham, Nelda Bucks, NP as PCP - General (Family Medicine) ?Tanda Rockers, MD as Consulting Physician (Pulmonary Disease) ?Deveron Furlong, NP as Nurse Practitioner (Psychiatry) ? ?Extended Emergency Contact Information ?Primary Emergency Contact: Michele,Roberta(Bobbi) ?Address: [address] ?         [place], [place] [address] [place] ?Home Phone: [phone] ?Mobile Phone: [phone] ?Relation: Daughter ?Secondary Emergency Contact: Call,David ?Address: [address]  ?         [place], [place] [address] [place] ?Home Phone: [phone] ?Work Phone: [phone] ?Relation: Son ? ?Code Status:  DNR ?Goals of care: Advanced Directive information ? ?  03/31/2022  ?  1:44 PM  ?Advanced Directives  ?Does Patient Have a Medical Advance Directive? Yes  ?Type of Advance Directive Out of facility DNR (pink MOST or yellow form)  ?Does patient want to make changes to medical advance directive? No - Patient declined  ? ? ? ?Chief Complaint  ?Patient presents with  ? Acute Visit  ?  Patient is request order for bedside commode wider than 21inches/ new wheelchair.  ? ? ?HPI:  ?Pt is a 87 y.o. female seen today for an acute visit for evaluation for bedside commode and Wheelchair.Care giver states current bedside commode is bend after patient and daughter sustained a fall.Wheelchair is also too small for patient.  Patient is wheelchair-bound and requires assistance with transfers. ? ?Past Medical History:  ?Diagnosis Date  ? A-fib ([facility])   ? Anemia   ? Anxiety   ? Arthritis   ? hands, spine   ? Bladder incontinence   ? Blood transfusion   ? Cancer ([facility]) 02/2019  ? left breast cancer  ? Cervicalgia   ? Constipation   ? Cough   ? Wert-onset 08/2009, as of 2014- resolved   ? Deep venous thrombosis ([facility])   ? post op, rec'd/needed  blood thinner   ? Depression   ? Depression with anxiety   ? Dysrhythmia   ? a-fib  ? Falls  frequently   ? pt. reports that she was falling 3 times a day, has had PT at a facility for a while & now is getting home PT 2-3 times/ week   ? Headache(784.0) 09/27/2011  ? History of stress test   ? 15-20 yrs.ago   ? Hypotension   ? Hypothyroidism   ? Lumbar radiculopathy 09/26/2011  ? Pneumonia   ? never been in hosp. for pneumonia   ? Prediabetes   ? Right hand fracture   ? Monday  ? Shortness of breath   ? Sleep apnea   ? does not use consistently  ? ?Past Surgical History:  ?Procedure Laterality Date  ? ANTERIOR CERVICAL DECOMP/DISCECTOMY FUSION N/A 07/09/2013  ? Procedure: ANTERIOR CERVICAL DECOMPRESSION/DISCECTOMY FUSION 3 LEVELS;  Surgeon: Sinclair Ship, MD;  Location: [facility];  Service: Orthopedics;  Laterality: N/A;  Anterior cervical decompression fusion, cervical 3-4, cervical 4-5-, cervical 5-6 with instrumentation, allograft.  ? APPENDECTOMY    ? BACK SURGERY    ? 2000  ? BREAST EXCISIONAL BIOPSY    ? breast bxs on left x 2, breast bx of right x 1-all benign  ? EYE SURGERY    ? cataracts bilateral /w IOL  ? HERNIA REPAIR    ? umbilical hernia- 2426  ? IR IMAGING GUIDED PORT INSERTION  04/28/2019  ? JOINT REPLACEMENT  2006,2007  ? bilateral  ?  LAPAROSCOPIC OVARIAN CYSTECTOMY    ? not done by laparscopy-abdominal incision  ? LEG SURGERY    ? ORIF PERIPROSTHETIC FRACTURE Right 07/26/2019  ? Procedure: OPEN REDUCTION INTERNAL FIXATION (ORIF) RIGHT FEMUR PERIPROSTHETIC FRACTURE;  Surgeon: Rod Can, MD;  Location: [facility];  Service: Orthopedics;  Laterality: Right;  ? PORTACATH PLACEMENT Right 04/01/2019  ? Procedure: ATTEMPTED INSERTION PORT-A-CATH WITH ULTRASOUND;  Surgeon: [name], MD;  Location: [facility];  Service: General;  Laterality: Right;  ? RADIOACTIVE SEED GUIDED PARTIAL MASTECTOMY WITH AXILLARY SENTINEL LYMPH NODE BIOPSY Left 04/01/2019  ? Procedure: LEFT BREAST PARTIAL MASTECTOMY WITH RADIOACTIVE SEED AND LEFT SENTINEL LYMPH NODE BIOPSY;  Surgeon: [name], MD;  Location: [facility];  Service: General;  Laterality: Left;  ? RE-EXCISION OF BREAST CANCER,SUPERIOR MARGINS Left 04/15/2019  ? Procedure: RE-EXCISION OF LEFT BREAST CANCER POSITIVE MARGINS;  Surgeon: [name], MD;  Location: [facility];  Service: General;  Laterality: Left;  ? REPLACEMENT TOTAL KNEE BILATERAL    ? TONSILLECTOMY    ? ? ?Allergies  ?Allergen Reactions  ? Clindamycin Hcl Other (See Comments)  ?  REACTION: swelling, pt. Reports that she had a 16 lb. weightgain in one day  ? ? ?Outpatient Encounter Medications as of 03/31/2022  ?Medication Sig  ? albuterol (VENTOLIN HFA) 108 (90 Base) MCG/ACT inhaler Inhale 2 puffs into the lungs every 6 (six) hours as needed for wheezing or shortness of breath.  ? Brexpiprazole 1 MG TABS Take 1 mg by mouth at bedtime.   ? busPIRone (BUSPAR) 5 MG tablet Take 5-10 mg by mouth 2 (two) times daily. 10 MG IN THE MORNING, 5 MG AT DINNER  ? CALCIUM CITRATE PO Take 600 mg by mouth daily.   ? clonazePAM (KLONOPIN) 0.5 MG tablet Take 1 tablet by mouth as needed in middle of the nite to go back to sleep  ? CRANBERRY PO Take 2 tablets by mouth daily.  ? D-Mannose 500 MG CAPS Take by mouth daily.  ? donepezil (ARICEPT) 10 MG tablet Take 1 tablet (10 mg total) by mouth at bedtime. Take 1  tablet at 10 mg daily  ? DULoxetine (CYMBALTA) 30 MG capsule Take 30 mg by mouth in the morning, at noon, and at bedtime.  ? ELIQUIS 5 MG TABS tablet TAKE 1 TABLET BY MOUTH TWICE A DAY  ? furosemide (LASIX) 20 MG tablet TAKE 1 TABLET BY MOUTH EVERY DAY  ? levothyroxine (SYNTHROID) 50 MCG tablet Take 1 tablet (50 mcg total) by mouth daily before breakfast. Dx E03.9  ? mirtazapine (REMERON) 15 MG tablet Take 7.5 mg by mouth at bedtime.  ? Multiple Vitamins-Minerals (MULTIVITAMIN PO) Take 1 tablet by mouth daily.   ? oxybutynin (DITROPAN-XL) 10 MG 24 hr tablet Take 10 mg by mouth daily.  ? oxyCODONE-acetaminophen (PERCOCET) 5-325 MG tablet Take 1  tablet by mouth every 4 (four) hours as needed for severe pain.  ? potassium chloride SA (KLOR-CON M20) 20 MEQ tablet TAKE 1 TABLET BY MOUTH EVERY DAY  ? Probiotic Product (PROBIOTIC DAILY PO) Take 1 tablet by mouth daily. OTC  ? senna-docusate (SENOKOT-S) 8.6-50 MG tablet Take 1 tablet by mouth 2 (two) times daily as needed for mild constipation.  ? traZODone (DESYREL) 100 MG tablet Take 100 mg by mouth at bedtime.  ? vitamin B-12 (CYANOCOBALAMIN) 500 MCG tablet Take 500 mcg by mouth daily.  ? ?No facility-administered encounter medications on file as of 03/31/2022.  ? ? ?Review  of Systems  ?Constitutional:  Negative for appetite change, chills, fatigue, fever and unexpected weight change.  ?HENT:  Positive for hearing loss. Negative for congestion, dental problem, ear discharge, ear pain, facial swelling, nosebleeds, postnasal drip, rhinorrhea, sinus pressure, sinus pain, sneezing, sore throat, tinnitus and trouble swallowing.   ?Eyes:  Negative for pain, discharge, redness, itching and visual disturbance.  ?Respiratory:  Negative for cough, chest tightness, shortness of breath and wheezing.   ?Cardiovascular:  Negative for chest pain, palpitations and leg swelling.  ?Gastrointestinal:  Negative for abdominal distention, abdominal pain, blood in stool, constipation, diarrhea, nausea and vomiting.  ?Genitourinary:  Negative for difficulty urinating, dysuria, flank pain, frequency and urgency.  ?     Incontinent  ?Musculoskeletal:  Positive for arthralgias, back pain and gait problem. Negative for joint swelling, myalgias, neck pain and neck stiffness.  ?Skin:  Negative for color change, pallor, rash and wound.  ?Neurological:  Negative for dizziness, syncope, speech difficulty, weakness, light-headedness, numbness and headaches.  ? ?Immunization History  ?Administered Date(s) Administered  ? Fluad Quad(high Dose 65+) 07/22/2020  ? Influenza Split 08/14/2013  ? Influenza Whole 08/20/2009  ? Influenza, High Dose  Seasonal PF 09/11/2017, 07/25/2018, 09/14/2021  ? Influenza,inj,Quad PF,6+ Mos 08/04/2014, 09/06/2015, 07/10/2016  ? PFIZER Comirnaty(Gray Top)Covid-19 Tri-Sucrose Vaccine 02/23/2021  ? PFIZER(Purple Top)SARS-COV-2 V

## 2022-04-03 DIAGNOSIS — R000 Tachycardia, unspecified: Secondary | ICD-10-CM | POA: Diagnosis not present

## 2022-04-03 DIAGNOSIS — R55 Syncope and collapse: Secondary | ICD-10-CM | POA: Diagnosis not present

## 2022-04-03 DIAGNOSIS — I10 Essential (primary) hypertension: Secondary | ICD-10-CM | POA: Diagnosis not present

## 2022-04-03 DIAGNOSIS — E860 Dehydration: Secondary | ICD-10-CM | POA: Diagnosis not present

## 2022-04-04 DIAGNOSIS — M6281 Muscle weakness (generalized): Secondary | ICD-10-CM | POA: Diagnosis not present

## 2022-04-04 DIAGNOSIS — R262 Difficulty in walking, not elsewhere classified: Secondary | ICD-10-CM | POA: Diagnosis not present

## 2022-04-12 DIAGNOSIS — R262 Difficulty in walking, not elsewhere classified: Secondary | ICD-10-CM | POA: Diagnosis not present

## 2022-04-12 DIAGNOSIS — M6281 Muscle weakness (generalized): Secondary | ICD-10-CM | POA: Diagnosis not present

## 2022-04-13 ENCOUNTER — Telehealth: Payer: Self-pay | Admitting: *Deleted

## 2022-04-13 NOTE — Telephone Encounter (Signed)
Andrea [name] called back and I advised her the order has been refaxed. She will call [employer] and see if there is anything more that is needed.

## 2022-04-13 NOTE — Telephone Encounter (Signed)
Andrea [name] called and stated that [facility] has not received patient's orders for Wheelchair or Bedside commode.   Refaxed to [facility] Fax: [phone] and Fax:[phone]

## 2022-04-19 DIAGNOSIS — M6281 Muscle weakness (generalized): Secondary | ICD-10-CM | POA: Diagnosis not present

## 2022-04-19 DIAGNOSIS — R262 Difficulty in walking, not elsewhere classified: Secondary | ICD-10-CM | POA: Diagnosis not present

## 2022-04-26 DIAGNOSIS — M6281 Muscle weakness (generalized): Secondary | ICD-10-CM | POA: Diagnosis not present

## 2022-04-26 DIAGNOSIS — R262 Difficulty in walking, not elsewhere classified: Secondary | ICD-10-CM | POA: Diagnosis not present

## 2022-04-28 DIAGNOSIS — I739 Peripheral vascular disease, unspecified: Secondary | ICD-10-CM | POA: Diagnosis not present

## 2022-04-28 DIAGNOSIS — L603 Nail dystrophy: Secondary | ICD-10-CM | POA: Diagnosis not present

## 2022-05-01 DIAGNOSIS — F332 Major depressive disorder, recurrent severe without psychotic features: Secondary | ICD-10-CM | POA: Diagnosis not present

## 2022-05-01 DIAGNOSIS — F411 Generalized anxiety disorder: Secondary | ICD-10-CM | POA: Diagnosis not present

## 2022-05-01 DIAGNOSIS — F341 Dysthymic disorder: Secondary | ICD-10-CM | POA: Diagnosis not present

## 2022-05-02 ENCOUNTER — Other Ambulatory Visit: Payer: Self-pay | Admitting: *Deleted

## 2022-05-02 DIAGNOSIS — Z8781 Personal history of (healed) traumatic fracture: Secondary | ICD-10-CM

## 2022-05-02 MED ORDER — OXYCODONE-ACETAMINOPHEN 5-325 MG PO TABS: 1.0000 | ORAL_TABLET | ORAL | 0 refills | Status: DC | PRN

## 2022-05-02 NOTE — Telephone Encounter (Signed)
Vickie, Caregiver called and requested refill.  Epic LR: 02/06/2022 Contract Date: 05/10/2021 Pended Rx and sent to [facility] for approval.

## 2022-05-24 DIAGNOSIS — N390 Urinary tract infection, site not specified: Secondary | ICD-10-CM | POA: Diagnosis not present

## 2022-05-25 ENCOUNTER — Ambulatory Visit (INDEPENDENT_AMBULATORY_CARE_PROVIDER_SITE_OTHER): Payer: Medicare Other | Admitting: Family

## 2022-05-25 ENCOUNTER — Encounter: Payer: Self-pay | Admitting: Family

## 2022-05-25 VITALS — BP 120/70 | HR 63 | Temp 97.1°F | Resp 16 | Ht 62.0 in | Wt 199.2 lb

## 2022-05-25 DIAGNOSIS — E1169 Type 2 diabetes mellitus with other specified complication: Secondary | ICD-10-CM | POA: Diagnosis not present

## 2022-05-25 DIAGNOSIS — I825Y3 Chronic embolism and thrombosis of unspecified deep veins of proximal lower extremity, bilateral: Secondary | ICD-10-CM

## 2022-05-25 DIAGNOSIS — Z170 Estrogen receptor positive status [ER+]: Secondary | ICD-10-CM | POA: Diagnosis not present

## 2022-05-25 DIAGNOSIS — F341 Dysthymic disorder: Secondary | ICD-10-CM | POA: Diagnosis not present

## 2022-05-25 DIAGNOSIS — D689 Coagulation defect, unspecified: Secondary | ICD-10-CM | POA: Diagnosis not present

## 2022-05-25 DIAGNOSIS — C50412 Malignant neoplasm of upper-outer quadrant of left female breast: Secondary | ICD-10-CM | POA: Diagnosis not present

## 2022-05-25 DIAGNOSIS — I480 Paroxysmal atrial fibrillation: Secondary | ICD-10-CM | POA: Diagnosis not present

## 2022-05-25 DIAGNOSIS — E039 Hypothyroidism, unspecified: Secondary | ICD-10-CM

## 2022-05-25 NOTE — Assessment & Plan Note (Signed)
Continue on Eliquis -No signs of bleeding reported

## 2022-05-25 NOTE — Assessment & Plan Note (Addendum)
Lab Results  Component Value Date   HGBA1C 5.2 11/24/2021  -Diet controlled -Continue to monitor -Annual eye and foot exam up-to-date continue to follow-up with ophthalmology and podiatrist, no signs of neuropathy reported

## 2022-05-25 NOTE — Assessment & Plan Note (Signed)
-   Continue on Eliquis 5 mg twice daily

## 2022-05-25 NOTE — Assessment & Plan Note (Signed)
-  No edema on lower extremities -On Eliquis 5 mg twice daily -We will continue to monitor

## 2022-05-25 NOTE — Assessment & Plan Note (Signed)
Lab Results  Component Value Date   TSH 1.26 05/24/2021  -Continue on levothyroxine 50 mcg daily on empty stomach -We will recheck TSH level today

## 2022-05-25 NOTE — Assessment & Plan Note (Signed)
-   Stable -Continue on Rexulti, buspirone, duloxetine, mirtazapine and clonazepam -Continue to follow-up with psychiatry

## 2022-05-25 NOTE — Assessment & Plan Note (Signed)
No pain reported.   

## 2022-05-25 NOTE — Progress Notes (Signed)
Provider: Marlowe Sax FNP-C   Britley Gashi, Nelda Bucks, NP  Patient Care Team: Reford Olliff, Nelda Bucks, NP as PCP - General (Family Medicine) Tanda Rockers, MD as Consulting Physician (Pulmonary Disease) Deveron Furlong, NP as Nurse Practitioner (Psychiatry)  Extended Emergency Contact Information Primary Emergency Contact: Lafortune,Roberta(Bobbi) Address: [address]          [place], [place] [address] [name] of [place] Phone: [phone] Mobile Phone: [phone] Relation: Daughter Secondary Emergency Contact: Pecora,David Address: [address]           [place], [place] [address] [place] Phone: [phone] Work Phone: [phone] Relation: Son  Code Status:  DNR  Goals of care: Advanced Directive information    05/25/2022    9:32 AM  Advanced Directives  Does Patient Have a Medical Advance Directive? Yes  Type of Advance Directive Out of facility DNR (pink MOST or yellow form)  Does patient want to make changes to medical advance directive? No - Patient declined     Chief Complaint  Patient presents with   Medical Management of Chronic Issues    6 month follow up.    Health Maintenance    Discuss the need for Eye exam, Foot exam, and Hemoglobin A1C.    HPI:  Pt is a 87 y.o. female seen today for  6 months follow up for medical management of chronic diseases. She is here with care giver who states patient seemed to have had hallucination yesterday so she collected a clean catch urine specimen and took to urologist still awaiting final urine culture.   Type 2 diabetes - up to date with annual eye exam and foot exam Has not been eating her ice cream bowl.Has lost weight 6 lbs since last visit.   Hypothyroidism - continues to take levothyroxine on empty stomach.   Depression and anxiety -continues to require as clonazepam as needed if she is really anxious care giver will give one at bedtime.  Chronic knee pain -Has not required  Percocet  03/22/2024 1 every 4 hours as needed.Has Tylenol as needed   Dementia - had some hallucination yesterday but thought possible developing UTI care giver send urine specimen to urologist office already awaiting results.No other behaviors reported.  Follows up with neurologist.  Appetite has been good.Continues to require assistance with activities of daily living.  Transfers with a gait belt.  A-fib -on Eliquis 5 mg twice daily no bleeding reported.   Unsteady gait - walks with a walker from room to the chair.Care giver follows with a wheelchair.  Has completed working with physical therapy.   Health maintenance: She is due for COVID-19 booster and shingles vaccine.  Made aware to get vaccine at the pharmacy   Past Medical History:  Diagnosis Date   A-fib [facility])    Anemia    Anxiety    Arthritis    hands, spine    Bladder incontinence    Blood transfusion    Cancer ([place]) 02/2019   left breast cancer   Cervicalgia    Constipation    Cough    [place]ert-onset 08/2009, as of 2014- resolved    Deep venous thrombosis ([place])    post op, rec'd/needed  blood thinner    Depression    Depression with anxiety    Dysrhythmia    a-fib   Falls frequently    pt. reports that she was falling 3 times a day, has had PT at a facility for a while &  now is getting home PT 2-3 times/ week    Headache(784.0) 09/27/2011   History of stress test    15-20 yrs.ago    Hypotension    Hypothyroidism    Lumbar radiculopathy 09/26/2011   Pneumonia    never been in hosp. for pneumonia    Prediabetes    Right hand fracture    Monday   Shortness of breath    Sleep apnea    does not use consistently   Past Surgical History:  Procedure Laterality Date   ANTERIOR CERVICAL DECOMP/DISCECTOMY FUSION N/A 07/09/2013   Procedure: ANTERIOR CERVICAL DECOMPRESSION/DISCECTOMY FUSION 3 LEVELS;  Surgeon: Sinclair Ship, MD;  Location: [facility];  Service: Orthopedics;  Laterality: N/A;  Anterior cervical decompression  fusion, cervical 3-4, cervical 4-5-, cervical 5-6 with instrumentation, allograft.   APPENDECTOMY     BACK SURGERY     2000   BREAST EXCISIONAL BIOPSY     breast bxs on left x 2, breast bx of right x 1-all benign   EYE SURGERY     cataracts bilateral /w IOL   HERNIA REPAIR     umbilical hernia- [id number]   IR IMAGING GUIDED PORT INSERTION  04/28/2019   JOINT REPLACEMENT  2006,2007   bilateral   LAPAROSCOPIC OVARIAN CYSTECTOMY     not done by laparscopy-abdominal incision   LEG SURGERY     ORIF PERIPROSTHETIC FRACTURE Right 07/26/2019   Procedure: OPEN REDUCTION INTERNAL FIXATION (ORIF) RIGHT FEMUR PERIPROSTHETIC FRACTURE;  Surgeon: Rod Can, MD;  Location: [facility];  Service: Orthopedics;  Laterality: Right;   PORTACATH PLACEMENT Right 04/01/2019   Procedure: ATTEMPTED INSERTION PORT-A-CATH WITH ULTRASOUND;  Surgeon: [name], MD;  Location: [facility];  Service: General;  Laterality: Right;   RADIOACTIVE SEED GUIDED PARTIAL MASTECTOMY WITH AXILLARY SENTINEL LYMPH NODE BIOPSY Left 04/01/2019   Procedure: LEFT BREAST PARTIAL MASTECTOMY WITH RADIOACTIVE SEED AND LEFT SENTINEL LYMPH NODE BIOPSY;  Surgeon: [name], MD;  Location: [facility];  Service: General;  Laterality: Left;   RE-EXCISION OF BREAST CANCER,SUPERIOR MARGINS Left 04/15/2019   Procedure: RE-EXCISION OF LEFT BREAST CANCER POSITIVE MARGINS;  Surgeon: [name], MD;  Location: [facility];  Service: General;  Laterality: Left;   REPLACEMENT TOTAL KNEE BILATERAL     TONSILLECTOMY      Allergies  Allergen Reactions   Clindamycin Hcl Other (See Comments)    REACTION: swelling, pt. Reports that she had a 16 lb. weightgain in one day    Allergies as of 05/25/2022       Reactions   Clindamycin Hcl Other (See Comments)   REACTION: swelling, pt. Reports that she had a 16 lb. weightgain in one day        Medication List        Accurate as of May 25, 2022   9:33 AM. If you have any questions, ask your nurse or doctor.          albuterol 108 (90 Base) MCG/ACT inhaler Commonly known as: VENTOLIN HFA Inhale 2 puffs into the lungs every 6 (six) hours as needed for wheezing or shortness of breath.   brexpiprazole 1 MG Tabs tablet Commonly known as: REXULTI Take 1 mg by mouth at bedtime.   busPIRone 5 MG tablet Commonly known as: BUSPAR Take 5-10 mg by mouth 2 (two) times daily. 10 MG IN THE MORNING, 5 MG AT DINNER   CALCIUM CITRATE PO Take 600 mg by mouth daily.   clonazePAM 0.5  MG tablet Commonly known as: KLONOPIN Take 1 tablet by mouth as needed in middle of the nite to go back to sleep   CRANBERRY PO Take 2 tablets by mouth daily.   D-Mannose 500 MG Caps Take by mouth daily.   donepezil 10 MG tablet Commonly known as: ARICEPT Take 1 tablet (10 mg total) by mouth at bedtime. Take 1  tablet at 10 mg daily   DULoxetine 30 MG capsule Commonly known as: CYMBALTA Take 30 mg by mouth in the morning, at noon, and at bedtime.   Eliquis 5 MG Tabs tablet Generic drug: apixaban TAKE 1 TABLET BY MOUTH TWICE A DAY   furosemide 20 MG tablet Commonly known as: LASIX TAKE 1 TABLET BY MOUTH EVERY DAY   Klor-Con M20 20 MEQ tablet Generic drug: potassium chloride SA TAKE 1 TABLET BY MOUTH EVERY DAY   levothyroxine 50 MCG tablet Commonly known as: SYNTHROID Take 1 tablet (50 mcg total) by mouth daily before breakfast. Dx E03.9   mirtazapine 15 MG tablet Commonly known as: REMERON Take 7.5 mg by mouth at bedtime.   MULTIVITAMIN PO Take 1 tablet by mouth daily.   oxybutynin 10 MG 24 hr tablet Commonly known as: DITROPAN-XL Take 10 mg by mouth daily.   oxyCODONE-acetaminophen 5-325 MG tablet Commonly known as: Percocet Take 1 tablet by mouth every 4 (four) hours as needed for severe pain.   PROBIOTIC DAILY PO Take 1 tablet by mouth daily. OTC   senna-docusate 8.6-50 MG tablet Commonly known as: Senokot-S Take 1 tablet  by mouth 2 (two) times daily as needed for mild constipation.   traZODone 100 MG tablet Commonly known as: DESYREL Take 100 mg by mouth at bedtime.   vitamin B-12 500 MCG tablet Commonly known as: CYANOCOBALAMIN Take 500 mcg by mouth daily.        Review of Systems  Unable to perform ROS: Dementia (Additional HPI information provided by caregiver present during the visit)  Constitutional:  Negative for appetite change, chills, fatigue, fever and unexpected weight change.  HENT:  Negative for congestion, dental problem, ear discharge, ear pain, facial swelling, hearing loss, nosebleeds, postnasal drip, rhinorrhea, sinus pressure, sinus pain, sneezing, sore throat, tinnitus and trouble swallowing.   Eyes:  Positive for visual disturbance. Negative for pain, discharge, redness and itching.       Wears eyeglasses and follows up with ophthalmologist  Respiratory:  Negative for cough, chest tightness, shortness of breath and wheezing.   Cardiovascular:  Negative for chest pain, palpitations and leg swelling.  Gastrointestinal:  Negative for abdominal distention, abdominal pain, blood in stool, constipation, diarrhea, nausea and vomiting.  Endocrine: Negative for cold intolerance, heat intolerance, polydipsia, polyphagia and polyuria.  Genitourinary:  Negative for difficulty urinating, dysuria, flank pain, frequency and urgency.  Musculoskeletal:  Positive for arthralgias and gait problem. Negative for back pain, joint swelling, myalgias, neck pain and neck stiffness.       Chronic knee pain stable on Tylenol as needed.  Skin:  Negative for color change, pallor, rash and wound.  Neurological:  Negative for dizziness, syncope, speech difficulty, weakness, light-headedness, numbness and headaches.  Hematological:  Does not bruise/bleed easily.  Psychiatric/Behavioral:  Negative for agitation, behavioral problems and sleep disturbance. The patient is not nervous/anxious.        Hallucinations  reported yesterday but none today.    Immunization History  Administered Date(s) Administered   Fluad Quad(high Dose 65+) 07/22/2020   Influenza Split 08/14/2013   Influenza Whole 08/20/2009  Influenza, High Dose Seasonal PF 09/11/2017, 07/25/2018, 09/14/2021   Influenza,inj,Quad PF,6+ Mos 08/04/2014, 09/06/2015, 07/10/2016   PFIZER Comirnaty(Gray Top)Covid-19 Tri-Sucrose Vaccine 02/23/2021   PFIZER(Purple Top)SARS-COV-2 Vaccination 12/27/2019, 01/17/2020, 08/30/2020, 09/14/2021   Pneumococcal Conjugate-13 01/14/2015   Pneumococcal Polysaccharide-23 07/11/2013   Td 11/20/2005, 11/27/2016   Pertinent  Health Maintenance Due  Topic Date Due   OPHTHALMOLOGY EXAM  02/16/2021   FOOT EXAM  07/22/2021   HEMOGLOBIN A1C  05/24/2022   INFLUENZA VACCINE  06/20/2022   URINE MICROALBUMIN  11/24/2022   DEXA SCAN  Completed      01/18/2022    4:50 PM 02/20/2022   10:24 AM 03/08/2022   12:57 PM 03/31/2022    1:44 PM 05/25/2022    9:32 AM  Fall Risk  Falls in the past year? 0 '1 1 1 '$ 0  Was there an injury with Fall? 0 1 0 0 0  Was there an injury with Fall? - Comments  some brusing, fell when she stood up after using the toilet, the legs on the potty broke.     Fall Risk Category Calculator 0 '2 2 1 '$ 0  Fall Risk Category Low Moderate Moderate Low Low  Patient Fall Risk Level Low fall risk Moderate fall risk High fall risk Low fall risk Low fall risk  Patient at Risk for Falls Due to No Fall Risks Impaired mobility  Impaired balance/gait No Fall Risks  Fall risk Follow up Falls evaluation completed   Falls evaluation completed Falls evaluation completed   Functional Status Survey:    Vitals:   05/25/22 0928  BP: 120/70  Pulse: 63  Resp: 16  Temp: (!) 97.1 F (36.2 C)  SpO2: 96%  Weight: 199 lb 3.2 oz (90.4 kg)  Height: '5\' 2"'$  (1.575 m)   Body mass index is 36.43 kg/m. Physical Exam Vitals reviewed.  Constitutional:      General: She is not in acute distress.    Appearance: Normal  appearance. She is obese. She is not ill-appearing or diaphoretic.  HENT:     Head: Normocephalic.     Right Ear: Tympanic membrane, ear canal and external ear normal. There is no impacted cerumen.     Left Ear: Tympanic membrane, ear canal and external ear normal. There is no impacted cerumen.     Nose: Nose normal. No congestion or rhinorrhea.     Mouth/Throat:     Mouth: Mucous membranes are moist.     Pharynx: Oropharynx is clear. No oropharyngeal exudate or posterior oropharyngeal erythema.  Eyes:     General: No scleral icterus.       Right eye: No discharge.        Left eye: No discharge.     Extraocular Movements: Extraocular movements intact.     Conjunctiva/sclera: Conjunctivae normal.     Pupils: Pupils are equal, round, and reactive to light.     Comments: Corrective lenses in place  Neck:     Vascular: No carotid bruit.  Cardiovascular:     Rate and Rhythm: Normal rate and regular rhythm.     Pulses: Normal pulses.     Heart sounds: Normal heart sounds. No murmur heard.    No friction rub. No gallop.  Pulmonary:     Effort: Pulmonary effort is normal. No respiratory distress.     Breath sounds: Normal breath sounds. No wheezing, rhonchi or rales.  Chest:     Chest wall: No tenderness.  Abdominal:     General: Bowel sounds are  normal. There is no distension.     Palpations: Abdomen is soft. There is no mass.     Tenderness: There is no abdominal tenderness. There is no right CVA tenderness, left CVA tenderness, guarding or rebound.  Musculoskeletal:        General: No swelling or tenderness. Normal range of motion.     Cervical back: Normal range of motion. No rigidity or tenderness.     Right knee: Crepitus present. No swelling or effusion. No tenderness.     Left knee: Crepitus present. No swelling or effusion. No tenderness.     Right lower leg: No edema.     Left lower leg: No edema.  Lymphadenopathy:     Cervical: No cervical adenopathy.  Skin:    General:  Skin is warm and dry.     Coloration: Skin is not pale.     Findings: No bruising, erythema, lesion or rash.  Neurological:     Mental Status: She is alert and oriented to person, place, and time.     Cranial Nerves: No cranial nerve deficit.     Sensory: No sensory deficit.     Motor: No weakness.     Coordination: Coordination normal.     Gait: Gait abnormal.  Psychiatric:        Mood and Affect: Mood normal. Affect is flat.        Speech: Speech normal.        Behavior: Behavior normal.        Thought Content: Thought content normal.        Cognition and Memory: Memory is impaired. She exhibits impaired recent memory.        Judgment: Judgment normal.     Labs reviewed: Recent Labs    11/25/21 1502 02/20/22 1054  NA 136 143  K 4.3 4.2  CL 100 104  CO2 26 28  GLUCOSE 126* 100  BUN 26* 27*  CREATININE 1.14 0.98*  CALCIUM 9.1 9.6   Recent Labs    11/25/21 1502 02/20/22 1054  AST 14 15  ALT 8 9  ALKPHOS 63  --   BILITOT 0.3 0.4  PROT 6.2 6.8  ALBUMIN 3.7  --    Recent Labs    02/20/22 1054  WBC 7.9  NEUTROABS 5,625  HGB 11.8  HCT 37.1  MCV 85.1  PLT 279   Lab Results  Component Value Date   TSH 1.26 05/24/2021   Lab Results  Component Value Date   HGBA1C 5.2 11/24/2021   Lab Results  Component Value Date   CHOL 172 10/16/2018   HDL 56 10/16/2018   LDLCALC 97 10/16/2018   TRIG 98 10/16/2018   CHOLHDL 3.1 10/16/2018    Significant Diagnostic Results in last 30 days:  No results found.  Assessment/Plan Problem List Items Addressed This Visit       Cardiovascular and Mediastinum   Paroxysmal A-fib (HCC)    - Continue on Eliquis 5 mg twice daily      Chronic deep vein thrombosis (DVT) of proximal vein of both lower extremities (HCC)    -No edema on lower extremities -On Eliquis 5 mg twice daily -We will continue to monitor        Endocrine   Diabetes mellitus ([place]) - Primary    Lab Results  Component Value Date   HGBA1C 5.2  11/24/2021  -Diet controlled -Continue to monitor -Annual eye and foot exam up-to-date continue to follow-up with ophthalmology and podiatrist, no signs  of neuropathy reported      Relevant Orders   Hemoglobin A1c   Acquired hypothyroidism    Lab Results  Component Value Date   TSH 1.26 05/24/2021  -Continue on levothyroxine 50 mcg daily on empty stomach -We will recheck TSH level today        Other   Malignant neoplasm of upper-outer quadrant of left breast in female, estrogen receptor positive ([facility])    No pain reported.      DEPRESSION/ANXIETY    - Stable -Continue on Rexulti, buspirone, duloxetine, mirtazapine and clonazepam -Continue to follow-up with psychiatry      Other Visit Diagnoses     Coagulation disorder ([facility])   (Chronic)         Family/ staff Communication: Reviewed plan of care with patient and caregiver verbalized understanding  Labs/tests ordered:  Has lab orders in place to be drawn today  Next Appointment : Return in about 6 months (around 11/25/2022) for medical mangement of chronic issues.Sandrea Hughs, NP

## 2022-05-25 NOTE — Patient Instructions (Signed)
Please get COVID-19 booster and shingles vaccine at the pharmacy

## 2022-05-26 LAB — CBC WITH DIFFERENTIAL/PLATELET
Absolute Monocytes: 422 cells/uL (ref 200–950)
Basophils Absolute: 62 cells/uL (ref 0–200)
Basophils Relative: 1 %
Eosinophils Absolute: 130 cells/uL (ref 15–500)
Eosinophils Relative: 2.1 %
HCT: 35.7 % (ref 35.0–45.0)
Hemoglobin: 11.2 g/dL — ABNORMAL LOW (ref 11.7–15.5)
Lymphs Abs: 1376 cells/uL (ref 850–3900)
MCH: 26.9 pg — ABNORMAL LOW (ref 27.0–33.0)
MCHC: 31.4 g/dL — ABNORMAL LOW (ref 32.0–36.0)
MCV: 85.8 fL (ref 80.0–100.0)
MPV: 9.8 fL (ref 7.5–12.5)
Monocytes Relative: 6.8 %
Neutro Abs: 4210 cells/uL (ref 1500–7800)
Neutrophils Relative %: 67.9 %
Platelets: 286 10*3/uL (ref 140–400)
RBC: 4.16 10*6/uL (ref 3.80–5.10)
RDW: 13.6 % (ref 11.0–15.0)
Total Lymphocyte: 22.2 %
WBC: 6.2 10*3/uL (ref 3.8–10.8)

## 2022-05-26 LAB — COMPLETE METABOLIC PANEL WITH GFR
AG Ratio: 1.5 (calc) (ref 1.0–2.5)
ALT: 9 U/L (ref 6–29)
AST: 16 U/L (ref 10–35)
Albumin: 3.8 g/dL (ref 3.6–5.1)
Alkaline phosphatase (APISO): 68 U/L (ref 37–153)
BUN/Creatinine Ratio: 20 (calc) (ref 6–22)
BUN: 21 mg/dL (ref 7–25)
CO2: 28 mmol/L (ref 20–32)
Calcium: 9.4 mg/dL (ref 8.6–10.4)
Chloride: 102 mmol/L (ref 98–110)
Creat: 1.04 mg/dL — ABNORMAL HIGH (ref 0.60–0.95)
Globulin: 2.6 g/dL (calc) (ref 1.9–3.7)
Glucose, Bld: 107 mg/dL — ABNORMAL HIGH (ref 65–99)
Potassium: 4 mmol/L (ref 3.5–5.3)
Sodium: 139 mmol/L (ref 135–146)
Total Bilirubin: 0.4 mg/dL (ref 0.2–1.2)
Total Protein: 6.4 g/dL (ref 6.1–8.1)
eGFR: 53 mL/min/{1.73_m2} — ABNORMAL LOW (ref >=60)

## 2022-05-26 LAB — HEMOGLOBIN A1C
Hgb A1c MFr Bld: 5 % of total Hgb (ref <5.7)
Mean Plasma Glucose: 97 mg/dL
eAG (mmol/L): 5.4 mmol/L

## 2022-05-26 LAB — TSH: TSH: 1.68 mIU/L (ref 0.40–4.50)

## 2022-05-26 LAB — LIPID PANEL
Cholesterol: 204 mg/dL — ABNORMAL HIGH (ref <200)
HDL: 59 mg/dL (ref >=50)
LDL Cholesterol (Calc): 122 mg/dL (calc) — ABNORMAL HIGH
Non-HDL Cholesterol (Calc): 145 mg/dL (calc) — ABNORMAL HIGH (ref <130)
Total CHOL/HDL Ratio: 3.5 (calc) (ref <5.0)
Triglycerides: 115 mg/dL (ref <150)

## 2022-05-31 DIAGNOSIS — N3946 Mixed incontinence: Secondary | ICD-10-CM | POA: Diagnosis not present

## 2022-05-31 DIAGNOSIS — N390 Urinary tract infection, site not specified: Secondary | ICD-10-CM | POA: Diagnosis not present

## 2022-06-17 ENCOUNTER — Other Ambulatory Visit: Payer: Self-pay | Admitting: Family

## 2022-06-17 DIAGNOSIS — E039 Hypothyroidism, unspecified: Secondary | ICD-10-CM

## 2022-07-06 ENCOUNTER — Other Ambulatory Visit: Payer: Self-pay | Admitting: Family

## 2022-07-06 DIAGNOSIS — R0602 Shortness of breath: Secondary | ICD-10-CM

## 2022-07-25 ENCOUNTER — Encounter: Payer: Self-pay | Admitting: Physician Assistant

## 2022-07-25 ENCOUNTER — Ambulatory Visit (INDEPENDENT_AMBULATORY_CARE_PROVIDER_SITE_OTHER): Payer: Medicare Other | Admitting: Physician Assistant

## 2022-07-25 VITALS — BP 105/71 | HR 74 | Wt 199.0 lb

## 2022-07-25 DIAGNOSIS — F01518 Vascular dementia, unspecified severity, with other behavioral disturbance: Secondary | ICD-10-CM | POA: Diagnosis not present

## 2022-07-25 MED ORDER — DONEPEZIL HCL 10 MG PO TABS
10.0000 mg | ORAL_TABLET | Freq: Every day | ORAL | 11 refills | Status: DC
Start: 2022-07-25 — End: 2023-04-26

## 2022-07-25 NOTE — Patient Instructions (Signed)
It was a pleasure to see you today at our office.   Recommendations:  Continue Aricept 10 mg daily.  Follow up in 6 months  Continue following up with Psychiatrist  for depression and possible med adjustment  Continue to hydrate to avoid UTIs    RECOMMENDATIONS FOR ALL PATIENTS WITH MEMORY PROBLEMS: 1. Continue to exercise (Recommend 30 minutes of walking everyday, or 3 hours every week) 2. Increase social interactions - continue going to [place] and enjoy social gatherings with friends and family 3. Eat healthy, avoid fried foods and eat more fruits and vegetables 4. Maintain adequate blood pressure, blood sugar, and blood cholesterol level. Reducing the risk of stroke and cardiovascular disease also helps promoting better memory. 5. Avoid stressful situations. Live a simple life and avoid aggravations. Organize your time and prepare for the next day in anticipation. 6. Sleep well, avoid any interruptions of sleep and avoid any distractions in the bedroom that may interfere with adequate sleep quality 7. Avoid sugar, avoid sweets as there is a strong link between excessive sugar intake, diabetes, and cognitive impairment We discussed the Mediterranean diet, which has been shown to help patients reduce the risk of progressive memory disorders and reduces cardiovascular risk. This includes eating fish, eat fruits and green leafy vegetables, nuts like almonds and hazelnuts, walnuts, and also use olive oil. Avoid fast foods and fried foods as much as possible. Avoid sweets and sugar as sugar use has been linked to worsening of memory function.  There is always a concern of gradual progression of memory problems. If this is the case, then we may need to adjust level of care according to patient needs. Support, both to the patient and caregiver, should then be put into place.    FALL PRECAUTIONS: Be cautious when walking. Scan the area for obstacles that may increase the risk of trips and falls.  When getting up in the mornings, sit up at the edge of the bed for a few minutes before getting out of bed. Consider elevating the bed at the head end to avoid drop of blood pressure when getting up. Walk always in a well-lit room (use night lights in the walls). Avoid area rugs or power cords from appliances in the middle of the walkways. Use a walker or a cane if necessary and consider physical therapy for balance exercise. Get your eyesight checked regularly.  FINANCIAL OVERSIGHT: Supervision, especially oversight when making financial decisions or transactions is also recommended.  HOME SAFETY: Consider the safety of the kitchen when operating appliances like stoves, microwave oven, and blender. Consider having supervision and share cooking responsibilities until no longer able to participate in those. Accidents with firearms and other hazards in the house should be identified and addressed as well.   ABILITY TO BE LEFT ALONE: If patient is unable to contact 911 operator, consider using LifeLine, or when the need is there, arrange for someone to stay with patients. Smoking is a fire hazard, consider supervision or cessation. Risk of wandering should be assessed by caregiver and if detected at any point, supervision and safe proof recommendations should be instituted.  MEDICATION SUPERVISION: Inability to self-administer medication needs to be constantly addressed. Implement a mechanism to ensure safe administration of the medications.

## 2022-07-25 NOTE — Progress Notes (Signed)
Assessment/Plan:   Mixed Vascular and Alzheimer's Dementia with behavioral disturbance  Andrea [name] is a very pleasant 87 y.o. RH female seen today in follow up for memory loss. Patient is currently on donepezil 10 mg daily. Her memory is stable.  Hallucinations have resolved.  She is on donepezil 10 mg daily, tolerating well.  Her mood is well controlled.     Follow up in  6 months. Continue donepezil 10 mg daily.  Side effects discussed. Continue mood control as per PCP         Subjective:    This patient is accompanied in the office by her caregiver who supplements the history.  Previous records as well as any outside records available were reviewed prior to todays visit.    Any changes in memory since last visit?  Her caregiver reports that her memory is stable, and he actually recognizes her room now, when before she could not. "Never been too social".  However she enjoys the company of her caregiver. Patient lives with: daughter repeats oneself?  Endorsed Disoriented when walking into a room?  Patient denies   Leaving objects in unusual places?  Patient denies   Ambulates  with difficulty? She uses a walker, and a wheelchair if she has to go out.  If she does not have a walker in front of her, she may experienced a panic attack.  She was doing physical therapy, but that has finished although she exercises in the house once a day.  Recent falls?  Patient denies   Any head injuries?  Patient denies   History of seizures?   Patient denies   Wandering behavior?  Patient denies   Patient drives?   Patient no longer drives  Any mood changes since last visit?  Patient denies.  Her anxiety is well controlled with Klonopin. Any worsening depression?:  Her sister is in the hospital, she is out of state, and she is having some depression regarding this issue. Hallucinations?  Resolved.  Paranoia?  Patient denies   Patient reports that sleeps well without vivid dreams, REM  behavior or sleepwalking   History of sleep apnea?  Endorsed. She does not like using the CPAP  Any hygiene concerns? Caregiver helps her. Independent of bathing and dressing?  Endorsed  Does the patient needs help with medications?  Caregiver fills the medication box and gives them to her  Who is in charge of the finances? Daughter  is in charge    Any changes in appetite?  Patient denies "she eats well " Patient have trouble swallowing? Patient denies   Does the patient cook?  Patient denies   Any kitchen accidents such as leaving the stove on? Patient denies   Any headaches?  Patient denies   Double vision? Patient denies   Any focal numbness or tingling?  Patient denies   Chronic back pain Patient denies   Unilateral weakness?  Patient denies   Any tremors?  Patient denies   Any history of anosmia?  Patient denies   Any incontinence of urine?  Endorsed.  She has a history of frequent UTIs and wears pads. Any bowel dysfunction?   Patient denies       Initial visit 10/17/2021 the patient is seen in neurologic consultation at the request of Ngetich, Dinah C, NP for the evaluation of memory loss.  The patient is accompanied by her caregiver who supplements the history.This is a 87 y.o. year old female who has had worsening memory issues  for about few months when she would get UTIs ( 5 bouts of them ).  However, the caregiver reports that her memory changes may have been present for at least 1-1/2-year, when she appears to be more disoriented, and has more trouble to remember new information, or people's names, or "mixes them ".  For example, she has a son who lives in [place], she cannot remember his name.  She denies repeating the same stories or asking the same questions.  However, she has been forgetting for example, how to sit in a chair or which way she needs to go when she is going from one room to another.  The patient states that "it may have been there for a longer time than that, but I  did not want to tell anyone".  She denies leaving objects in unusual places.  She lives with her daughter.  Her mood is usually good, only when the caregiver is not there, she may show more concern, and feel "more down".  When the caregiver is there, she has more social interaction.  She no longer drives.  She sleeps well, "a lot more than before ".  She denies any vivid dreams or sleepwalking.  No hallucinations or paranoia.  She uses a walker to ambulate, and a wheelchair at times.  In the past, she tried physical therapy 2-3 times a week, but she is no longer interested, "she did not like it ".  No recent falls or head injuries.  The caregiver is not aware of any hygiene concerns, she needs assistance with bathing and dressing.  The caregiver is in charge of the medications, and the finances are taking care of by the daughter.  Her appetite is good, denies trouble swallowing.  She no longer cooks. Denies headaches, double vision, dizziness, focal numbness or tingling, unilateral weakness or tremors or anosmia. No history of seizures.  As mentioned above, she has frequent UTIs, wears pads and pull ups , retention, denies constipation or diarrhea.  She denies anosmia, she did not have any recent COVID.  She has OSA, but lately has not been using the CPAP, because "makes me cold ".  She denies any alcohol or tobacco abuse.  Family history negative for Alzheimer's disease or any other type of dementia.   Labs 05/2021 TSH 1.26, B12 1272 PREVIOUS MEDICATIONS:   CURRENT MEDICATIONS:  Outpatient Encounter Medications as of 07/25/2022  Medication Sig   albuterol (VENTOLIN HFA) 108 (90 Base) MCG/ACT inhaler Inhale 2 puffs into the lungs every 6 (six) hours as needed for wheezing or shortness of breath.   Brexpiprazole 1 MG TABS Take 1 mg by mouth at bedtime.    busPIRone (BUSPAR) 5 MG tablet Take 5-10 mg by mouth 2 (two) times daily. 10 MG IN THE MORNING, 5 MG AT DINNER   CALCIUM CITRATE PO Take 600 mg by mouth  daily.    clonazePAM (KLONOPIN) 0.5 MG tablet Take 1 tablet by mouth as needed in middle of the nite to go back to sleep   CRANBERRY PO Take 2 tablets by mouth daily.   D-Mannose 500 MG CAPS Take by mouth daily.   DULoxetine (CYMBALTA) 30 MG capsule Take 30 mg by mouth in the morning, at noon, and at bedtime.   ELIQUIS 5 MG TABS tablet TAKE 1 TABLET BY MOUTH TWICE A DAY   furosemide (LASIX) 20 MG tablet TAKE 1 TABLET BY MOUTH EVERY DAY   levothyroxine (SYNTHROID) 50 MCG tablet TAKE 1 TABLET BY MOUTH  DAILY BEFORE BREAKFAST. DX E03.9   mirtazapine (REMERON) 15 MG tablet Take 7.5 mg by mouth at bedtime.   Multiple Vitamins-Minerals (MULTIVITAMIN PO) Take 1 tablet by mouth daily.    oxybutynin (DITROPAN-XL) 10 MG 24 hr tablet Take 10 mg by mouth daily.   potassium chloride SA (KLOR-CON M20) 20 MEQ tablet TAKE 1 TABLET BY MOUTH EVERY DAY   Probiotic Product (PROBIOTIC DAILY PO) Take 1 tablet by mouth daily. OTC   senna-docusate (SENOKOT-S) 8.6-50 MG tablet Take 1 tablet by mouth 2 (two) times daily as needed for mild constipation.   traZODone (DESYREL) 100 MG tablet Take 100 mg by mouth at bedtime.   vitamin B-12 (CYANOCOBALAMIN) 500 MCG tablet Take 500 mcg by mouth daily.   [DISCONTINUED] donepezil (ARICEPT) 10 MG tablet Take 1 tablet (10 mg total) by mouth at bedtime. Take 1  tablet at 10 mg daily   donepezil (ARICEPT) 10 MG tablet Take 1 tablet (10 mg total) by mouth at bedtime. Take 1  tablet at 10 mg daily   No facility-administered encounter medications on file as of 07/25/2022.       09/29/2021    3:52 PM 12/10/2018    1:20 PM 12/03/2017    8:57 AM  MMSE - Mini Mental State Exam  Orientation to time '1 5 5  '$ Orientation to Place '2 4 4  '$ Registration '2 3 3  '$ Attention/ Calculation 0 4 5  Recall '2 2 1  '$ Language- name 2 objects '2 2 2  '$ Language- repeat '1 1 1  '$ Language- follow 3 step command '2 2 3  '$ Language- read & follow direction '1 1 1  '$ Write a sentence 0 1 1  Copy design 0 0 1  Total  score '13 25 27       '$ No data to display          Objective:     PHYSICAL EXAMINATION:    VITALS:   Vitals:   07/25/22 1112  BP: 105/71  Pulse: 74  SpO2: 97%  Weight: 199 lb (90.3 kg)    GEN:  The patient appears stated age and is in NAD. HEENT:  Normocephalic, atraumatic.   Neurological examination:  General: NAD, well-groomed, appears stated age. Orientation: The patient is alert. Oriented to person, place and not to date Cranial nerves: There is good facial symmetry.The speech is fluent and clear. No aphasia or dysarthria. Fund of knowledge is appropriate. Recent and remote memory are impaired. Attention and concentration are reduced.  Able to name objects and repeat phrases.  Hearing is intact to conversational tone.    Sensation: Sensation is intact to light touch throughout Motor: Strength is at least antigravity x4. Tremors: none  DTR's 2/4 in UE/LE     Movement examination: Tone: There is normal tone in the UE/LE Abnormal movements:  no tremor.  No myoclonus.  No asterixis.   Coordination:  There is no decremation with RAM's. Normal finger to nose  Gait and Station: The patient has difficulty arising out of a deep-seated chair without the use of the hands and becomes anxious if needing to get up. Unable to test gait and stride.    Thank you for allowing [place] the opportunity to participate in the care of this nice patient. Please do not hesitate to contact us for any questions or concerns.   Total time spent on today's visit was 20 minutes dedicated to this patient today, preparing to see patient, examining the patient, ordering tests and/or medications and  counseling the patient, documenting clinical information in the EHR or other health record, independently interpreting results and communicating results to the patient/family, discussing treatment and goals, answering patient's questions and coordinating care.  Cc:  Ngetich, Nelda Bucks, NP  Sharene Butters 07/26/2022  7:07 AM

## 2022-07-26 DIAGNOSIS — F411 Generalized anxiety disorder: Secondary | ICD-10-CM | POA: Diagnosis not present

## 2022-07-26 DIAGNOSIS — F341 Dysthymic disorder: Secondary | ICD-10-CM | POA: Diagnosis not present

## 2022-07-26 DIAGNOSIS — F332 Major depressive disorder, recurrent severe without psychotic features: Secondary | ICD-10-CM | POA: Diagnosis not present

## 2022-07-28 DIAGNOSIS — I739 Peripheral vascular disease, unspecified: Secondary | ICD-10-CM | POA: Diagnosis not present

## 2022-07-28 DIAGNOSIS — L603 Nail dystrophy: Secondary | ICD-10-CM | POA: Diagnosis not present

## 2022-08-16 DIAGNOSIS — L600 Ingrowing nail: Secondary | ICD-10-CM | POA: Diagnosis not present

## 2022-08-17 ENCOUNTER — Telehealth: Payer: Self-pay

## 2022-08-17 MED ORDER — ACETAMINOPHEN 500 MG PO TABS: 500.0000 mg | ORAL_TABLET | Freq: Three times a day (TID) | ORAL | 0 refills | Status: AC

## 2022-08-17 NOTE — Telephone Encounter (Signed)
Message left on clinical intake voicemail:   Need a refill on Oxycodone  It appears that Oxycodone was removed on patients medication list on 05/25/2022.  I returned called to patients daughter Jolayne Haines and she states patient still has some Oxycodone on hand and she takes them off/on for various pains and aches. Patient recently had a toenail removed at the podiatrist and thought it would be a good idea to get it refilled at this time.   Dinah please advise

## 2022-08-17 NOTE — Telephone Encounter (Signed)
I discussed reply with patients daughter and she verbalized understanding. Patients daughter did mention that she believes it was removed in error and the caregiver that was with the patient at her last appointment did not know that she takes the oxycodone as needed.  Patient will call for appointment if the tylenol is not effective.

## 2022-08-17 NOTE — Telephone Encounter (Signed)
I recommend Extra strength Tylenol 500 mg tablet one by mouth every 8 hrs for the next 3 days then change to as needed.

## 2022-08-20 ENCOUNTER — Other Ambulatory Visit: Payer: Self-pay | Admitting: Family

## 2022-08-20 DIAGNOSIS — Z79899 Other long term (current) drug therapy: Secondary | ICD-10-CM

## 2022-08-21 NOTE — Telephone Encounter (Signed)
Pharmacy requested refill

## 2022-09-27 ENCOUNTER — Encounter (HOSPITAL_COMMUNITY): Payer: Self-pay

## 2022-09-27 ENCOUNTER — Other Ambulatory Visit: Payer: Self-pay

## 2022-09-27 ENCOUNTER — Emergency Department (HOSPITAL_COMMUNITY): Payer: Medicare Other

## 2022-09-27 ENCOUNTER — Inpatient Hospital Stay (HOSPITAL_COMMUNITY)
Admission: EM | Admit: 2022-09-27 | Discharge: 2022-09-30 | DRG: 193 | Disposition: A | Discharge status: Home Health | Payer: Medicare Other | Attending: Internal Medicine | Admitting: Internal Medicine

## 2022-09-27 DIAGNOSIS — Z87891 Personal history of nicotine dependence: Secondary | ICD-10-CM

## 2022-09-27 DIAGNOSIS — Z66 Do not resuscitate: Secondary | ICD-10-CM | POA: Diagnosis present

## 2022-09-27 DIAGNOSIS — J189 Pneumonia, unspecified organism: Secondary | ICD-10-CM | POA: Diagnosis not present

## 2022-09-27 DIAGNOSIS — E785 Hyperlipidemia, unspecified: Secondary | ICD-10-CM | POA: Diagnosis present

## 2022-09-27 DIAGNOSIS — E039 Hypothyroidism, unspecified: Secondary | ICD-10-CM | POA: Diagnosis present

## 2022-09-27 DIAGNOSIS — M545 Low back pain, unspecified: Secondary | ICD-10-CM | POA: Diagnosis present

## 2022-09-27 DIAGNOSIS — I959 Hypotension, unspecified: Secondary | ICD-10-CM | POA: Diagnosis not present

## 2022-09-27 DIAGNOSIS — G4733 Obstructive sleep apnea (adult) (pediatric): Secondary | ICD-10-CM

## 2022-09-27 DIAGNOSIS — R5381 Other malaise: Secondary | ICD-10-CM | POA: Diagnosis present

## 2022-09-27 DIAGNOSIS — I480 Paroxysmal atrial fibrillation: Secondary | ICD-10-CM | POA: Diagnosis present

## 2022-09-27 DIAGNOSIS — Z86718 Personal history of other venous thrombosis and embolism: Secondary | ICD-10-CM

## 2022-09-27 DIAGNOSIS — Z79890 Hormone replacement therapy (postmenopausal): Secondary | ICD-10-CM

## 2022-09-27 DIAGNOSIS — R400 Somnolence: Secondary | ICD-10-CM | POA: Diagnosis not present

## 2022-09-27 DIAGNOSIS — F0393 Unspecified dementia, unspecified severity, with mood disturbance: Secondary | ICD-10-CM | POA: Diagnosis present

## 2022-09-27 DIAGNOSIS — Z96653 Presence of artificial knee joint, bilateral: Secondary | ICD-10-CM | POA: Diagnosis present

## 2022-09-27 DIAGNOSIS — Z1152 Encounter for screening for COVID-19: Secondary | ICD-10-CM | POA: Diagnosis not present

## 2022-09-27 DIAGNOSIS — E1169 Type 2 diabetes mellitus with other specified complication: Secondary | ICD-10-CM | POA: Diagnosis present

## 2022-09-27 DIAGNOSIS — Z7901 Long term (current) use of anticoagulants: Secondary | ICD-10-CM | POA: Diagnosis not present

## 2022-09-27 DIAGNOSIS — W19XXXA Unspecified fall, initial encounter: Secondary | ICD-10-CM | POA: Diagnosis present

## 2022-09-27 DIAGNOSIS — M1990 Unspecified osteoarthritis, unspecified site: Secondary | ICD-10-CM | POA: Diagnosis present

## 2022-09-27 DIAGNOSIS — D649 Anemia, unspecified: Secondary | ICD-10-CM | POA: Diagnosis present

## 2022-09-27 DIAGNOSIS — Z23 Encounter for immunization: Secondary | ICD-10-CM

## 2022-09-27 DIAGNOSIS — G9341 Metabolic encephalopathy: Secondary | ICD-10-CM | POA: Diagnosis present

## 2022-09-27 DIAGNOSIS — R059 Cough, unspecified: Secondary | ICD-10-CM | POA: Diagnosis not present

## 2022-09-27 DIAGNOSIS — I739 Peripheral vascular disease, unspecified: Secondary | ICD-10-CM | POA: Diagnosis not present

## 2022-09-27 DIAGNOSIS — Z8261 Family history of arthritis: Secondary | ICD-10-CM

## 2022-09-27 DIAGNOSIS — G894 Chronic pain syndrome: Secondary | ICD-10-CM | POA: Diagnosis present

## 2022-09-27 DIAGNOSIS — Z818 Family history of other mental and behavioral disorders: Secondary | ICD-10-CM

## 2022-09-27 DIAGNOSIS — M439 Deforming dorsopathy, unspecified: Secondary | ICD-10-CM | POA: Diagnosis not present

## 2022-09-27 DIAGNOSIS — Z8249 Family history of ischemic heart disease and other diseases of the circulatory system: Secondary | ICD-10-CM

## 2022-09-27 DIAGNOSIS — I10 Essential (primary) hypertension: Secondary | ICD-10-CM | POA: Diagnosis present

## 2022-09-27 DIAGNOSIS — K5900 Constipation, unspecified: Secondary | ICD-10-CM | POA: Diagnosis present

## 2022-09-27 DIAGNOSIS — R918 Other nonspecific abnormal finding of lung field: Secondary | ICD-10-CM | POA: Diagnosis not present

## 2022-09-27 DIAGNOSIS — F418 Other specified anxiety disorders: Secondary | ICD-10-CM | POA: Diagnosis present

## 2022-09-27 DIAGNOSIS — R531 Weakness: Secondary | ICD-10-CM | POA: Diagnosis not present

## 2022-09-27 DIAGNOSIS — R296 Repeated falls: Secondary | ICD-10-CM | POA: Diagnosis present

## 2022-09-27 DIAGNOSIS — F341 Dysthymic disorder: Secondary | ICD-10-CM | POA: Diagnosis present

## 2022-09-27 DIAGNOSIS — Z853 Personal history of malignant neoplasm of breast: Secondary | ICD-10-CM

## 2022-09-27 DIAGNOSIS — Z79891 Long term (current) use of opiate analgesic: Secondary | ICD-10-CM

## 2022-09-27 DIAGNOSIS — R110 Nausea: Secondary | ICD-10-CM | POA: Diagnosis not present

## 2022-09-27 DIAGNOSIS — S0990XA Unspecified injury of head, initial encounter: Secondary | ICD-10-CM | POA: Diagnosis not present

## 2022-09-27 DIAGNOSIS — Z79899 Other long term (current) drug therapy: Secondary | ICD-10-CM

## 2022-09-27 DIAGNOSIS — Z881 Allergy status to other antibiotic agents status: Secondary | ICD-10-CM

## 2022-09-27 LAB — URINALYSIS, ROUTINE W REFLEX MICROSCOPIC
Bilirubin Urine: NEGATIVE
Glucose, UA: NEGATIVE mg/dL
Hgb urine dipstick: NEGATIVE
Ketones, ur: 5 mg/dL — AB
Leukocytes,Ua: NEGATIVE
Nitrite: NEGATIVE
Protein, ur: NEGATIVE mg/dL
Specific Gravity, Urine: 1.018 (ref 1.005–1.030)
pH: 6 (ref 5.0–8.0)

## 2022-09-27 LAB — CBC WITH DIFFERENTIAL/PLATELET
Abs Immature Granulocytes: 0.03 10*3/uL (ref 0.00–0.07)
Basophils Absolute: 0.1 10*3/uL (ref 0.0–0.1)
Basophils Relative: 1 %
Eosinophils Absolute: 0.2 10*3/uL (ref 0.0–0.5)
Eosinophils Relative: 2 %
HCT: 33.3 % — ABNORMAL LOW (ref 36.0–46.0)
Hemoglobin: 9.9 g/dL — ABNORMAL LOW (ref 12.0–15.0)
Immature Granulocytes: 0 %
Lymphocytes Relative: 16 %
Lymphs Abs: 1.6 10*3/uL (ref 0.7–4.0)
MCH: 24.3 pg — ABNORMAL LOW (ref 26.0–34.0)
MCHC: 29.7 g/dL — ABNORMAL LOW (ref 30.0–36.0)
MCV: 81.6 fL (ref 80.0–100.0)
Monocytes Absolute: 0.7 10*3/uL (ref 0.1–1.0)
Monocytes Relative: 7 %
Neutro Abs: 7.2 10*3/uL (ref 1.7–7.7)
Neutrophils Relative %: 74 %
Platelets: 280 10*3/uL (ref 150–400)
RBC: 4.08 MIL/uL (ref 3.87–5.11)
RDW: 15.3 % (ref 11.5–15.5)
WBC: 9.7 10*3/uL (ref 4.0–10.5)
nRBC: 0 % (ref 0.0–0.2)

## 2022-09-27 LAB — COMPREHENSIVE METABOLIC PANEL
ALT: 14 U/L (ref 0–44)
AST: 19 U/L (ref 15–41)
Albumin: 3.3 g/dL — ABNORMAL LOW (ref 3.5–5.0)
Alkaline Phosphatase: 67 U/L (ref 38–126)
Anion gap: 8 (ref 5–15)
BUN: 23 mg/dL (ref 8–23)
CO2: 26 mmol/L (ref 22–32)
Calcium: 9.6 mg/dL (ref 8.9–10.3)
Chloride: 110 mmol/L (ref 98–111)
Creatinine, Ser: 0.95 mg/dL (ref 0.44–1.00)
GFR, Estimated: 59 mL/min — ABNORMAL LOW (ref >60)
Glucose, Bld: 113 mg/dL — ABNORMAL HIGH (ref 70–99)
Potassium: 4.4 mmol/L (ref 3.5–5.1)
Sodium: 144 mmol/L (ref 135–145)
Total Bilirubin: 0.6 mg/dL (ref 0.3–1.2)
Total Protein: 6.7 g/dL (ref 6.5–8.1)

## 2022-09-27 LAB — RESP PANEL BY RT-PCR (FLU A&B, COVID) ARPGX2
Influenza A by PCR: NEGATIVE
Influenza B by PCR: NEGATIVE
SARS Coronavirus 2 by RT PCR: NEGATIVE

## 2022-09-27 LAB — TROPONIN I (HIGH SENSITIVITY)
Troponin I (High Sensitivity): 9 ng/L (ref <18)
Troponin I (High Sensitivity): 11 ng/L (ref <18)

## 2022-09-27 LAB — TSH: TSH: 0.65 u[IU]/mL (ref 0.350–4.500)

## 2022-09-27 LAB — LIPASE, BLOOD: Lipase: 36 U/L (ref 11–51)

## 2022-09-27 LAB — STREP PNEUMONIAE URINARY ANTIGEN: Strep Pneumo Urinary Antigen: NEGATIVE

## 2022-09-27 MED ORDER — INFLUENZA VAC A&B SA ADJ QUAD 0.5 ML IM PRSY
0.5000 mL | PREFILLED_SYRINGE | INTRAMUSCULAR | Status: AC
  Administered 2022-09-29: 0.5 mL via INTRAMUSCULAR
  Filled 2022-09-27: qty 0.5

## 2022-09-27 MED ORDER — SODIUM CHLORIDE 0.9 % IV SOLN
2.0000 g | INTRAVENOUS | Status: DC
  Administered 2022-09-28 – 2022-09-29 (×2): 2 g via INTRAVENOUS
  Filled 2022-09-27 (×2): qty 20

## 2022-09-27 MED ORDER — BUSPIRONE HCL 5 MG PO TABS: 5.0000 mg | ORAL_TABLET | Freq: Two times a day (BID) | ORAL | Status: DC

## 2022-09-27 MED ORDER — TRAZODONE HCL 100 MG PO TABS
100.0000 mg | ORAL_TABLET | Freq: Every day | ORAL | Status: DC
  Administered 2022-09-27 – 2022-09-29 (×3): 100 mg via ORAL
  Filled 2022-09-27 (×3): qty 1

## 2022-09-27 MED ORDER — CYANOCOBALAMIN 500 MCG PO TABS
500.0000 ug | ORAL_TABLET | Freq: Every day | ORAL | Status: DC
  Administered 2022-09-28 – 2022-09-30 (×2): 500 ug via ORAL
  Filled 2022-09-27 (×3): qty 1

## 2022-09-27 MED ORDER — LEVOTHYROXINE SODIUM 50 MCG PO TABS
50.0000 ug | ORAL_TABLET | Freq: Every day | ORAL | Status: DC
  Administered 2022-09-28 – 2022-09-30 (×3): 50 ug via ORAL
  Filled 2022-09-27 (×4): qty 1

## 2022-09-27 MED ORDER — SODIUM CHLORIDE 0.9 % IV BOLUS
500.0000 mL | Freq: Once | INTRAVENOUS | Status: AC
  Administered 2022-09-27: 500 mL via INTRAVENOUS

## 2022-09-27 MED ORDER — DULOXETINE HCL 30 MG PO CPEP
30.0000 mg | ORAL_CAPSULE | Freq: Every day | ORAL | Status: DC
  Administered 2022-09-28 – 2022-09-30 (×3): 30 mg via ORAL
  Filled 2022-09-27 (×3): qty 1

## 2022-09-27 MED ORDER — AZITHROMYCIN 250 MG PO TABS
250.0000 mg | ORAL_TABLET | Freq: Every day | ORAL | Status: DC
  Administered 2022-09-28 – 2022-09-30 (×3): 250 mg via ORAL
  Filled 2022-09-27 (×3): qty 1

## 2022-09-27 MED ORDER — BUSPIRONE HCL 10 MG PO TABS
10.0000 mg | ORAL_TABLET | Freq: Every day | ORAL | Status: DC
  Administered 2022-09-28 – 2022-09-30 (×3): 10 mg via ORAL
  Filled 2022-09-27 (×3): qty 1

## 2022-09-27 MED ORDER — BUSPIRONE HCL 5 MG PO TABS
5.0000 mg | ORAL_TABLET | Freq: Every day | ORAL | Status: DC
  Administered 2022-09-27 – 2022-09-29 (×3): 5 mg via ORAL
  Filled 2022-09-27 (×3): qty 1

## 2022-09-27 MED ORDER — SENNOSIDES-DOCUSATE SODIUM 8.6-50 MG PO TABS: 1.0000 | ORAL_TABLET | Freq: Two times a day (BID) | ORAL | Status: DC | PRN

## 2022-09-27 MED ORDER — OXYBUTYNIN CHLORIDE ER 5 MG PO TB24
10.0000 mg | ORAL_TABLET | Freq: Every day | ORAL | Status: DC
  Administered 2022-09-28 – 2022-09-30 (×3): 10 mg via ORAL
  Filled 2022-09-27 (×3): qty 2

## 2022-09-27 MED ORDER — ALBUTEROL SULFATE (2.5 MG/3ML) 0.083% IN NEBU: 2.5000 mg | INHALATION_SOLUTION | RESPIRATORY_TRACT | Status: DC | PRN

## 2022-09-27 MED ORDER — ACETAMINOPHEN 325 MG PO TABS: 650.0000 mg | ORAL_TABLET | Freq: Four times a day (QID) | ORAL | Status: DC | PRN

## 2022-09-27 MED ORDER — DONEPEZIL HCL 10 MG PO TABS
10.0000 mg | ORAL_TABLET | Freq: Every day | ORAL | Status: DC
  Administered 2022-09-27 – 2022-09-29 (×3): 10 mg via ORAL
  Filled 2022-09-27 (×3): qty 1

## 2022-09-27 MED ORDER — BREXPIPRAZOLE 1 MG PO TABS
1.0000 mg | ORAL_TABLET | Freq: Every day | ORAL | Status: DC
  Administered 2022-09-27 – 2022-09-29 (×3): 1 mg via ORAL
  Filled 2022-09-27 (×3): qty 1

## 2022-09-27 MED ORDER — MIRTAZAPINE 15 MG PO TABS
7.5000 mg | ORAL_TABLET | Freq: Every day | ORAL | Status: DC
  Administered 2022-09-27 – 2022-09-29 (×3): 7.5 mg via ORAL
  Filled 2022-09-27 (×3): qty 1

## 2022-09-27 MED ORDER — APIXABAN 5 MG PO TABS
5.0000 mg | ORAL_TABLET | Freq: Two times a day (BID) | ORAL | Status: DC
  Administered 2022-09-27 – 2022-09-30 (×6): 5 mg via ORAL
  Filled 2022-09-27 (×6): qty 1

## 2022-09-27 MED ORDER — CLONAZEPAM 0.5 MG PO TABS
0.5000 mg | ORAL_TABLET | Freq: Every evening | ORAL | Status: DC | PRN
  Administered 2022-09-29: 0.5 mg via ORAL
  Filled 2022-09-27: qty 1

## 2022-09-27 MED ORDER — SODIUM CHLORIDE 0.9 % IV SOLN
500.0000 mg | Freq: Once | INTRAVENOUS | Status: AC
  Administered 2022-09-27: 500 mg via INTRAVENOUS
  Filled 2022-09-27: qty 5

## 2022-09-27 MED ORDER — ORAL CARE MOUTH RINSE: 15.0000 mL | OROMUCOSAL | Status: DC | PRN

## 2022-09-27 MED ORDER — SODIUM CHLORIDE 0.9 % IV SOLN
1.0000 g | Freq: Once | INTRAVENOUS | Status: AC
  Administered 2022-09-27: 1 g via INTRAVENOUS
  Filled 2022-09-27: qty 10

## 2022-09-27 MED ORDER — ACETAMINOPHEN 650 MG RE SUPP: 650.0000 mg | Freq: Four times a day (QID) | RECTAL | Status: DC | PRN

## 2022-09-27 NOTE — ED Triage Notes (Signed)
Pt BIB EMS from home pt fell last Thursday and since then has been having increased weakness, loss of appetite and ams from baseline. Pt hx dementia,takes eliquis. Lives at home with daughter 150/74 62hr 18rr Cbg 159

## 2022-09-27 NOTE — Progress Notes (Signed)
Documented history of OSA noted. Patient refuses the use of CPAP. Order placed prn. She is aware that she may request a machine, should she decide to try one.

## 2022-09-27 NOTE — ED Provider Notes (Signed)
Emergency Department Provider Note   I have reviewed the triage vital signs and the nursing notes.   HISTORY  Chief Complaint Weakness   HPI Andrea [name] is a 87 y.o. female past history of A-fib on anticoagulation, dementia, and hypothyroidism presents to the ED with AMS and generalized weakness. Patient's daughter is at bedside. Patient lives at home and is typically pleasantly confused but talkative. She had a fall 6 days prior with unknown head injury. No hospital encounter at that time. Patient has become more somnolent and apparently confused. She is having to be fed, which is not typical, and is having difficulty getting up to walk. Patient has also been complaining of lower back pain initially after the fall.    Past Medical History:  Diagnosis Date   A-fib ([place])    Anemia    Anxiety    Arthritis    hands, spine    Bladder incontinence    Blood transfusion    Cancer ([place]) 02/2019   left breast cancer   Cervicalgia    Constipation    Cough    [place]ert-onset 08/2009, as of 2014- resolved    Deep venous thrombosis ([place])    post op, rec'd/needed  blood thinner    Depression    Depression with anxiety    Dysrhythmia    a-fib   Falls frequently    pt. reports that she was falling 3 times a day, has had PT at a facility for a while & now is getting home PT 2-3 times/ week    Headache(784.0) 09/27/2011   History of stress test    15-20 yrs.ago    Hypotension    Hypothyroidism    Lumbar radiculopathy 09/26/2011   Pneumonia    never been in hosp. for pneumonia    Prediabetes    Right hand fracture    Monday   Shortness of breath    Sleep apnea    does not use consistently    Review of Systems  Level 5 caveat: AMS - Dementia   ____________________________________________   PHYSICAL EXAM:  VITAL SIGNS: ED Triage Vitals  Enc Vitals Group     BP --      Pulse Rate 09/27/22 1300 (!) 58     Resp 09/27/22 1300 14     Temp 09/27/22 1259 98.1 F (36.7 C)      Temp src --      SpO2 09/27/22 1300 98 %     Weight 09/27/22 1300 198 lb 6.6 oz (90 kg)     Height 09/27/22 1300 '5\' 2"'$  (1.575 m)   Constitutional: Somnolent. Arouses to voice but not talkative.  Eyes: Conjunctivae are normal. PERRL.  Head: Atraumatic. Nose: No congestion/rhinnorhea. Mouth/Throat: Mucous membranes are slightly dry.    Neck: No stridor. Cardiovascular: Bradycardia. Good peripheral circulation. Grossly normal heart sounds.   Respiratory: Normal respiratory effort.  No retractions. Lungs CTAB. Gastrointestinal: Soft and nontender. No distention.  Musculoskeletal: No lower extremity tenderness nor edema. No gross deformities of extremities. Neurologic: Moving extremities equally. No speaking enough to fully assess for speech changes.  Skin:  Skin is warm, dry and intact. No rash noted.   ____________________________________________   LABS (all labs ordered are listed, but only abnormal results are displayed)  Labs Reviewed  CULTURE, BLOOD (ROUTINE X 2) - Abnormal; Notable for the following components:      Result Value   Culture   (*)    Value: STAPHYLOCOCCUS EPIDERMIDIS THE SIGNIFICANCE OF  ISOLATING THIS ORGANISM FROM A SINGLE SET OF BLOOD CULTURES WHEN MULTIPLE SETS ARE DRAWN IS UNCERTAIN. PLEASE NOTIFY THE MICROBIOLOGY DEPARTMENT WITHIN ONE WEEK IF SPECIATION AND SENSITIVITIES ARE REQUIRED. Performed at [facility], [place] [address] [place], [place] [address]    All other components within normal limits  BLOOD CULTURE ID PANEL (REFLEXED) - BCID2 - Abnormal; Notable for the following components:   Staphylococcus species DETECTED (*)    Staphylococcus epidermidis DETECTED (*)    Methicillin resistance mecA/C DETECTED (*)    All other components within normal limits  COMPREHENSIVE METABOLIC PANEL - Abnormal; Notable for the following components:   Glucose, Bld 113 (*)    Albumin 3.3 (*)    GFR, Estimated 59 (*)    All other components within normal  limits  CBC WITH DIFFERENTIAL/PLATELET - Abnormal; Notable for the following components:   Hemoglobin 9.9 (*)    HCT 33.3 (*)    MCH 24.3 (*)    MCHC 29.7 (*)    All other components within normal limits  URINALYSIS, ROUTINE W REFLEX MICROSCOPIC - Abnormal; Notable for the following components:   Color, Urine AMBER (*)    APPearance HAZY (*)    Ketones, ur 5 (*)    All other components within normal limits  GLUCOSE, CAPILLARY - Abnormal; Notable for the following components:   Glucose-Capillary 100 (*)    All other components within normal limits  GLUCOSE, CAPILLARY - Abnormal; Notable for the following components:   Glucose-Capillary 101 (*)    All other components within normal limits  URINE CULTURE  RESP PANEL BY RT-PCR (FLU A&B, COVID) ARPGX2  CULTURE, BLOOD (ROUTINE X 2)  EXPECTORATED SPUTUM ASSESSMENT W GRAM STAIN, RFLX TO RESP C  LIPASE, BLOOD  TSH  LEGIONELLA PNEUMOPHILA SEROGP 1 UR AG  STREP PNEUMONIAE URINARY ANTIGEN  TROPONIN I (HIGH SENSITIVITY)  TROPONIN I (HIGH SENSITIVITY)   ____________________________________________  EKG   EKG Interpretation  Date/Time:  Wednesday September 27 2022 14:23:17 EST Ventricular Rate:  60 PR Interval:  169 QRS Duration: 90 QT Interval:  449 QTC Calculation: 449 R Axis:   58 Text Interpretation: Sinus rhythm Confirmed by Nanda Quinton [phone]) on 09/27/2022 2:40:23 PM        ____________________________________________   PROCEDURES  Procedure(s) performed:   Procedures  None ____________________________________________   INITIAL IMPRESSION / ASSESSMENT AND PLAN / ED COURSE  Pertinent labs & imaging results that were available during my care of the patient were reviewed by me and considered in my medical decision making (see chart for details).   This patient is Presenting for Evaluation of AMS, which does require a range of treatment options, and is a complaint that involves a high risk of morbidity and  mortality.  The Differential Diagnoses includes but is not exclusive to alcohol, illicit or prescription medications, intracranial pathology such as stroke, intracerebral hemorrhage, fever or infectious causes including sepsis, hypoxemia, uremia, trauma, endocrine related disorders such as diabetes, hypoglycemia, thyroid-related diseases, etc.   Critical Interventions-    Medications  sodium chloride 0.9 % bolus 500 mL (0 mLs Intravenous Stopped 09/27/22 1544)  cefTRIAXone (ROCEPHIN) 1 g in sodium chloride 0.9 % 100 mL IVPB (0 g Intravenous Stopped 09/27/22 1544)  azithromycin (ZITHROMAX) 500 mg in sodium chloride 0.9 % 250 mL IVPB (0 mg Intravenous Stopped 09/27/22 1645)  influenza vaccine adjuvanted (FLUAD) injection 0.5 mL (0.5 mLs Intramuscular Given 09/29/22 1316)  bisacodyl (DULCOLAX) suppository 10 mg (10 mg Rectal Given 09/29/22 1310)  Reassessment after intervention: Symptoms improving.    I did obtain Additional Historical Information from daughter at bedside.   I decided to review pertinent External Data, and in summary no ED encounters in this facility for at least the last 6 months.    Clinical Laboratory Tests Ordered, included CBC without leukocytosis.   Radiologic Tests Ordered, included CXR. I independently interpreted the images and agree with radiology interpretation.   Cardiac Monitor Tracing which shows sinus bradycardia.    Social Determinants of Health Risk patient is a non-smoker.   Consult complete with Hospitalist. Plan for admit.   Medical Decision Making: Summary:  Presents to the emergency department for evaluation of altered mental status over the past 6 days.  Symptoms seem to start shortly after a fall with unknown head trauma.  She is anticoagulated will obtain a CT of the head along with imaging of the chest and lower back. Will follow labs and viral PCR panel for COVID/Flu.   Reevaluation with update and discussion with patient and family. PNA on  CXR. Plan for admit.    Disposition: admit  ____________________________________________  FINAL CLINICAL IMPRESSION(S) / ED DIAGNOSES  Final diagnoses:  Community acquired pneumonia of left lower lobe of lung  Weakness     NEW OUTPATIENT MEDICATIONS STARTED DURING THIS VISIT:  Discharge Medication List as of 09/30/2022  9:55 AM     START taking these medications   Details  amoxicillin-clavulanate (AUGMENTIN) 875-125 MG tablet Take 1 tablet by mouth 2 (two) times daily for 4 days., Starting Sat 09/30/2022, Until Wed 10/04/2022, Normal        Note:  This document was prepared using Dragon voice recognition software and may include unintentional dictation errors.  Nanda Quinton, MD, [facility]    Asuncion Shibata, [facility], MD 10/03/22 [phone]

## 2022-09-27 NOTE — H&P (Signed)
History and Physical    Enedina Pair QMV:[id number] DOB: 1935/07/08 DOA: 09/27/2022  PCP: Sandrea Hughs, NP  Patient coming from: Home  I have personally briefly reviewed patient's old medical records available.   Chief Complaint: Fall at home, weakness and confused  HPI: Andrea [name] is a 87 y.o. female with medical history significant of dementia, paroxysmal A-fib on Eliquis, anxiety, depression, frequent falls, hypothyroidism brought from home to the hospital by family members due to worsening mental status and generalized weakness.  According to the family patient is usually pleasant, confused but talkative all the time.  She fell at home about 6 days ago without injuring.  Since then she has been more somnolent and more confused.  She has to be fed.  She is having difficulty getting up and walking.  Patient cannot give any history due to dementia. Apparently, patient is having more delirium symptoms, restless at night. ED Course: Blood pressure stable.  On room air.  WC count 9.7.  Skeletal survey including CT scan of the head and lumbar spine no new findings.  Chest x-ray shows midlung opacity probable bronchopneumonia. COVID-19 and influenza, negative. Urinalysis, pending. Patient was given Rocephin azithromycin in the ER and admission requested due to significant findings.  Review of Systems: Unable.  Advanced dementia.  Patient answers "I do not know" for everything.   Past Medical History:  Diagnosis Date   A-fib ([place])    Anemia    Anxiety    Arthritis    hands, spine    Bladder incontinence    Blood transfusion    Cancer ([place]) 02/2019   left breast cancer   Cervicalgia    Constipation    Cough    [place]-onset 08/2009, as of 2014- resolved    Deep venous thrombosis ([place])    post op, rec'd/needed  blood thinner    Depression    Depression with anxiety    Dysrhythmia    a-fib   Falls frequently    pt. reports that she was falling 3 times a day, has had PT  at a facility for a while & now is getting home PT 2-3 times/ week    Headache(784.0) 09/27/2011   History of stress test    15-20 yrs.ago    Hypotension    Hypothyroidism    Lumbar radiculopathy 09/26/2011   Pneumonia    never been in hosp. for pneumonia    Prediabetes    Right hand fracture    Monday   Shortness of breath    Sleep apnea    does not use consistently    Past Surgical History:  Procedure Laterality Date   ANTERIOR CERVICAL DECOMP/DISCECTOMY FUSION N/A 07/09/2013   Procedure: ANTERIOR CERVICAL DECOMPRESSION/DISCECTOMY FUSION 3 LEVELS;  Surgeon: Sinclair Ship, MD;  Location: [place];  Service: Orthopedics;  Laterality: N/A;  Anterior cervical decompression fusion, cervical 3-4, cervical 4-5-, cervical 5-6 with instrumentation, allograft.   APPENDECTOMY     BACK SURGERY     2000   BREAST EXCISIONAL BIOPSY     breast bxs on left x 2, breast bx of right x 1-all benign   EYE SURGERY     cataracts bilateral /w IOL   HERNIA REPAIR     umbilical hernia- 2841   IR IMAGING GUIDED PORT INSERTION  04/28/2019   JOINT REPLACEMENT  2006,2007   bilateral   LAPAROSCOPIC OVARIAN CYSTECTOMY     not done by laparscopy-abdominal incision   LEG SURGERY  ORIF PERIPROSTHETIC FRACTURE Right 07/26/2019   Procedure: OPEN REDUCTION INTERNAL FIXATION (ORIF) RIGHT FEMUR PERIPROSTHETIC FRACTURE;  Surgeon: Rod Can, MD;  Location: [place];  Service: Orthopedics;  Laterality: Right;   PORTACATH PLACEMENT Right 04/01/2019   Procedure: ATTEMPTED INSERTION PORT-A-CATH WITH ULTRASOUND;  Surgeon: [name], MD;  Location: [place];  Service: General;  Laterality: Right;   RADIOACTIVE SEED GUIDED PARTIAL MASTECTOMY WITH AXILLARY SENTINEL LYMPH NODE BIOPSY Left 04/01/2019   Procedure: LEFT BREAST PARTIAL MASTECTOMY WITH RADIOACTIVE SEED AND LEFT SENTINEL LYMPH NODE BIOPSY;  Surgeon: [name], MD;  Location: [place];  Service: General;   Laterality: Left;   RE-EXCISION OF BREAST CANCER,SUPERIOR MARGINS Left 04/15/2019   Procedure: RE-EXCISION OF LEFT BREAST CANCER POSITIVE MARGINS;  Surgeon: [name], MD;  Location: [place];  Service: General;  Laterality: Left;   REPLACEMENT TOTAL KNEE BILATERAL     TONSILLECTOMY      Social history   reports that she quit smoking about 20 months ago. Her smoking use included cigarettes. She has a 5.00 pack-year smoking history. She has never used smokeless tobacco. She reports that she does not currently use alcohol. She reports that she does not use drugs.  Allergies  Allergen Reactions   Clindamycin Hcl Other (See Comments)    REACTION: swelling, pt. Reports that she had a 16 lb. weightgain in one day    Family History  Problem Relation Age of Onset   Heart disease Father    Malignant hyperthermia Father    Arthritis Father    Deep vein thrombosis Son    Diabetes Son    Obesity Son    Depression Son    Hypertension Son    Parkinson's disease Mother    Diabetes Daughter    Hypertension Daughter    Anxiety disorder Daughter    Hyperlipidemia Son    Hypertension Son    Cancer Maternal Aunt      Prior to Admission medications   Medication Sig Start Date End Date Taking? Authorizing Provider  albuterol (VENTOLIN HFA) 108 (90 Base) MCG/ACT inhaler Inhale 2 puffs into the lungs every 6 (six) hours as needed for wheezing or shortness of breath. 05/26/20   Noemi Chapel P, DO  Brexpiprazole 1 MG TABS Take 1 mg by mouth at bedtime.     [provider]  busPIRone (BUSPAR) 5 MG tablet Take 5-10 mg by mouth 2 (two) times daily. 10 MG IN THE MORNING, 5 MG AT [facility]    [provider]  CALCIUM CITRATE PO Take 600 mg by mouth daily.     [provider]  clonazePAM (KLONOPIN) 0.5 MG tablet Take 1 tablet by mouth as needed in middle of the nite to go back to sleep 12/07/17   Reed, Tiffany L, DO  CRANBERRY PO Take 2 tablets by mouth daily.     [provider]  D-Mannose 500 MG CAPS Take by mouth daily.    [provider]  donepezil (ARICEPT) 10 MG tablet Take 1 tablet (10 mg total) by mouth at bedtime. Take 1  tablet at 10 mg daily 07/25/22   Rondel Jumbo, PA-C  DULoxetine (CYMBALTA) 30 MG capsule Take 30 mg by mouth in the morning, at noon, and at bedtime.    [provider]  ELIQUIS 5 MG TABS tablet TAKE 1 TABLET BY MOUTH TWICE A DAY 03/27/22   Ngetich, Dinah C, NP  furosemide (LASIX) 20 MG tablet TAKE 1 TABLET  BY MOUTH EVERY DAY 07/06/22   Ngetich, Dinah C, NP  KLOR-CON M20 20 MEQ tablet TAKE 1 TABLET BY MOUTH EVERY DAY 08/21/22   Ngetich, Dinah C, NP  levothyroxine (SYNTHROID) 50 MCG tablet TAKE 1 TABLET BY MOUTH DAILY BEFORE BREAKFAST. DX E03.9 06/19/22   Ngetich, Dinah C, NP  mirtazapine (REMERON) 15 MG tablet Take 7.5 mg by mouth at bedtime. 05/12/15   [provider]  Multiple Vitamins-Minerals (MULTIVITAMIN PO) Take 1 tablet by mouth daily.     [provider]  oxybutynin (DITROPAN-XL) 10 MG 24 hr tablet Take 10 mg by mouth daily. 07/08/19   [provider]  Probiotic Product (PROBIOTIC DAILY PO) Take 1 tablet by mouth daily. OTC    [provider]  senna-docusate (SENOKOT-S) 8.6-50 MG tablet Take 1 tablet by mouth 2 (two) times daily as needed for mild constipation. 07/30/19   Mercy Riding, MD  traZODone (DESYREL) 100 MG tablet Take 100 mg by mouth at bedtime.    [provider]  vitamin B-12 (CYANOCOBALAMIN) 500 MCG tablet Take 500 mcg by mouth daily.    [provider]    Physical Exam: Vitals:   09/27/22 1259 09/27/22 1300 09/27/22 1315 09/27/22 1430  BP:   (!) 153/73 (!) 151/60  Pulse:  (!) 58 61 (!) 56  Resp:  '14 15 14  '$ Temp: 98.1 F (36.7 C)     SpO2:  98% 98% 99%  Weight:  90 kg    Height:  '5\' 2"'$  (1.575 m)      Constitutional: NAD, calm, comfortable Vitals:   09/27/22 1259 09/27/22 1300 09/27/22 1315 09/27/22 1430  BP:   (!) 153/73  (!) 151/60  Pulse:  (!) 58 61 (!) 56  Resp:  '14 15 14  '$ Temp: 98.1 F (36.7 C)     SpO2:  98% 98% 99%  Weight:  90 kg    Height:  '5\' 2"'$  (1.575 m)     Eyes: PERRL, lids and conjunctivae normal ENMT: Mucous membranes are moist. Posterior pharynx clear of any exudate or lesions.Normal dentition.  Neck: normal, supple, no masses, no thyromegaly Respiratory: clear to auscultation bilaterally, no wheezing, no crackles. Normal respiratory effort. No accessory muscle use.  Cardiovascular: Regular rate and rhythm, no murmurs / rubs / gallops. No extremity edema. 2+ pedal pulses. No carotid bruits.  Abdomen: no tenderness, no masses palpated. No hepatosplenomegaly. Bowel sounds positive.  Musculoskeletal: no clubbing / cyanosis. No joint deformity upper and lower extremities. Good ROM, no contractures. Normal muscle tone.  Skin: no rashes, lesions, ulcers. No induration Neurologic: CN 2-12 grossly intact. Sensation intact, DTR normal. Strength 5/5 in all 4.  Psychiatric: Pleasant.  Confused.  Awake.  Follows simple commands.  Oriented x0.    Labs on Admission: I have personally reviewed following labs and imaging studies  CBC: Recent Labs  Lab 09/27/22 1309  WBC 9.7  NEUTROABS 7.2  HGB 9.9*  HCT 33.3*  MCV 81.6  PLT 626   Basic Metabolic Panel: Recent Labs  Lab 09/27/22 1309  NA 144  K 4.4  CL 110  CO2 26  GLUCOSE 113*  BUN 23  CREATININE 0.95  CALCIUM 9.6   GFR: Estimated Creatinine Clearance: 45.2 mL/min (by C-G formula based on SCr of 0.95 mg/dL). Liver Function Tests: Recent Labs  Lab 09/27/22 1309  AST 19  ALT 14  ALKPHOS 67  BILITOT 0.6  PROT 6.7  ALBUMIN 3.3*   Recent Labs  Lab 09/27/22 1309  LIPASE 36   No results for input(s): "AMMONIA" in the last 168 hours. Coagulation Profile: No results for input(s): "INR", "PROTIME" in the last 168 hours. Cardiac Enzymes: No results for input(s): "CKTOTAL", "CKMB", "CKMBINDEX", "TROPONINI" in the last 168  hours. BNP (last 3 results) No results for input(s): "PROBNP" in the last 8760 hours. HbA1C: No results for input(s): "HGBA1C" in the last 72 hours. CBG: No results for input(s): "GLUCAP" in the last 168 hours. Lipid Profile: No results for input(s): "CHOL", "HDL", "LDLCALC", "TRIG", "CHOLHDL", "LDLDIRECT" in the last 72 hours. Thyroid Function Tests: Recent Labs    09/27/22 1437  TSH 0.650   Anemia Panel: No results for input(s): "VITAMINB12", "FOLATE", "FERRITIN", "TIBC", "IRON", "RETICCTPCT" in the last 72 hours. Urine analysis:    Component Value Date/Time   COLORURINE YELLOW 03/01/2021 1510   APPEARANCEUR CLEAR 03/01/2021 1510   APPEARANCEUR Cloudy (A) 09/22/2014 1015   LABSPEC 1.009 03/01/2021 1510   PHURINE 6.5 03/01/2021 1510   GLUCOSEU NEGATIVE 03/01/2021 1510   HGBUR NEGATIVE 03/01/2021 1510   BILIRUBINUR negative 01/19/2022 1438   BILIRUBINUR Negative 09/22/2014 1015   KETONESUR NEGATIVE 03/01/2021 1510   PROTEINUR Positive (A) 01/19/2022 1438   PROTEINUR NEGATIVE 03/01/2021 1510   UROBILINOGEN negative (A) 01/19/2022 1438   UROBILINOGEN 0.2 03/11/2013 2003   NITRITE positive 01/19/2022 1438   NITRITE NEGATIVE 03/01/2021 1510   LEUKOCYTESUR Large (3+) (A) 01/19/2022 1438   LEUKOCYTESUR 1+ (A) 03/01/2021 1510    Radiological Exams on Admission: DG Lumbar Spine Complete  Result Date: 09/27/2022 CLINICAL DATA:  Cough.  Fell last week. EXAM: LUMBAR SPINE - COMPLETE 4+ VIEW COMPARISON:  05/31/2013 FINDINGS: Thoracolumbar curvature convex to the left. Straightening of the normal lumbar lordosis. Previous decompression and fusion from L2 to the sacrum. No acute traumatic finding. IMPRESSION: No acute or traumatic finding. Previous decompression and fusion from L2 to the sacrum. Electronically Signed   By: Nelson Chimes M.D.   On: 09/27/2022 14:22   DG Chest 2 View  Result Date: 09/27/2022 CLINICAL DATA:  [place] last week.  Cough.  Weakness. EXAM: CHEST - 2 VIEW  COMPARISON:  04/21/2020 FINDINGS: Patient has taken a poor inspiration. Power port on the right with the tip at the SVC RA junction. Hazy and patchy opacity in the left mid lung suspicious for bronchopneumonia. No dense consolidation or lobar collapse. No visible effusion. IMPRESSION: Poor inspiration. Patchy and hazy opacity in the left mid lung suspicious for bronchopneumonia. Electronically Signed   By: Nelson Chimes M.D.   On: 09/27/2022 14:21   CT Head Wo Contrast  Result Date: 09/27/2022 CLINICAL DATA:  Trauma, fall EXAM: CT HEAD WITHOUT CONTRAST TECHNIQUE: Contiguous axial images were obtained from the base of the skull through the vertex without intravenous contrast. RADIATION DOSE REDUCTION: This exam was performed according to the departmental dose-optimization program which includes automated exposure control, adjustment of the mA and/or kV according to patient size and/or use of iterative reconstruction technique. COMPARISON:  CT done on 07/29/2019, MR done on 11/11/2021 FINDINGS: Brain: No acute intracranial findings are seen. There are no signs of bleeding within the cranium. Cortical sulci are prominent. There is decreased density in periventricular and subcortical white matter. Vascular: Unremarkable. Skull: Unremarkable. Sinuses/Orbits: Unremarkable. Other: No significant interval changes are noted. IMPRESSION: No acute intracranial findings are seen. Atrophy. Small-vessel disease. Electronically Signed   By: Elmer Picker M.D.   On: 09/27/2022 13:37    EKG: Independently reviewed.  Low voltage EKG, normal sinus rhythm.  No acute ST-T wave changes.  Assessment/Plan Principal Problem:   Pneumonia Active Problems:   DEPRESSION/ANXIETY   Long term (current) use of anticoagulants   Hyperlipidemia associated with type 2 diabetes mellitus (HCC)   Essential (primary) hypertension   Acquired hypothyroidism   OSA on CPAP     1.  Left middle lobe pneumonia: Agree with admission  given severity of symptoms. Antibiotics to treat bacterial pneumonia, continue Rocephin azithromycin. Chest physiotherapy, incentive spirometry, deep breathing exercises, sputum induction, mucolytic's and bronchodilators. Sputum cultures, blood cultures, Legionella and streptococcal antigen. Supplemental oxygen to keep saturations more than 90%. Speech therapy evaluation to monitor for aspiration.  2.  Acute metabolic encephalopathy in a patient with advanced dementia: Patient with advanced dementia features including restlessness and confusion.  Currently stable.  Her symptomatology could be related to advancing dementia symptoms. On multiple medications including brexpiprazole, BuSpar, Klonopin, Cymbalta, Remeron and trazodone at night.  3.  Depression/anxiety/chronic pain syndrome: Patient on trazodone, buspirone, Klonopin.  Continued.  4.  Paroxysmal A-fib: Currently in sinus rhythm.  Resume Eliquis.  5.  Deconditioning, physical debility: Work with PT OT.  6.  Obstructive sleep apnea: Use CPAP at night.  DVT prophylaxis: Eliquis Code Status: DNR/DNI, has documentation with patient and also scanned on the chart Family Communication: None at the bedside Disposition Plan: Unknown at this time, possibly home with home health Consults called: None Admission status: Observation.  Admission and need for hospitalization will be reviewed every day.   Barb Merino MD [facility] Pager [phone]

## 2022-09-28 ENCOUNTER — Encounter: Payer: Medicare Other | Admitting: Family

## 2022-09-28 DIAGNOSIS — M1990 Unspecified osteoarthritis, unspecified site: Secondary | ICD-10-CM | POA: Diagnosis present

## 2022-09-28 DIAGNOSIS — G894 Chronic pain syndrome: Secondary | ICD-10-CM | POA: Diagnosis present

## 2022-09-28 DIAGNOSIS — R5381 Other malaise: Secondary | ICD-10-CM | POA: Diagnosis present

## 2022-09-28 DIAGNOSIS — Z1152 Encounter for screening for COVID-19: Secondary | ICD-10-CM | POA: Diagnosis not present

## 2022-09-28 DIAGNOSIS — Z87891 Personal history of nicotine dependence: Secondary | ICD-10-CM | POA: Diagnosis not present

## 2022-09-28 DIAGNOSIS — F0393 Unspecified dementia, unspecified severity, with mood disturbance: Secondary | ICD-10-CM | POA: Diagnosis present

## 2022-09-28 DIAGNOSIS — D649 Anemia, unspecified: Secondary | ICD-10-CM | POA: Diagnosis present

## 2022-09-28 DIAGNOSIS — E785 Hyperlipidemia, unspecified: Secondary | ICD-10-CM | POA: Diagnosis present

## 2022-09-28 DIAGNOSIS — Z23 Encounter for immunization: Secondary | ICD-10-CM | POA: Diagnosis present

## 2022-09-28 DIAGNOSIS — M545 Low back pain, unspecified: Secondary | ICD-10-CM | POA: Diagnosis present

## 2022-09-28 DIAGNOSIS — K5900 Constipation, unspecified: Secondary | ICD-10-CM | POA: Diagnosis present

## 2022-09-28 DIAGNOSIS — I480 Paroxysmal atrial fibrillation: Secondary | ICD-10-CM | POA: Diagnosis present

## 2022-09-28 DIAGNOSIS — E039 Hypothyroidism, unspecified: Secondary | ICD-10-CM | POA: Diagnosis present

## 2022-09-28 DIAGNOSIS — Z8261 Family history of arthritis: Secondary | ICD-10-CM | POA: Diagnosis not present

## 2022-09-28 DIAGNOSIS — J189 Pneumonia, unspecified organism: Secondary | ICD-10-CM | POA: Diagnosis present

## 2022-09-28 DIAGNOSIS — Z853 Personal history of malignant neoplasm of breast: Secondary | ICD-10-CM | POA: Diagnosis not present

## 2022-09-28 DIAGNOSIS — G9341 Metabolic encephalopathy: Secondary | ICD-10-CM | POA: Diagnosis present

## 2022-09-28 DIAGNOSIS — I10 Essential (primary) hypertension: Secondary | ICD-10-CM | POA: Diagnosis present

## 2022-09-28 DIAGNOSIS — G4733 Obstructive sleep apnea (adult) (pediatric): Secondary | ICD-10-CM | POA: Diagnosis present

## 2022-09-28 DIAGNOSIS — F418 Other specified anxiety disorders: Secondary | ICD-10-CM | POA: Diagnosis present

## 2022-09-28 DIAGNOSIS — R296 Repeated falls: Secondary | ICD-10-CM | POA: Diagnosis present

## 2022-09-28 DIAGNOSIS — Z7901 Long term (current) use of anticoagulants: Secondary | ICD-10-CM | POA: Diagnosis not present

## 2022-09-28 DIAGNOSIS — Z66 Do not resuscitate: Secondary | ICD-10-CM | POA: Diagnosis present

## 2022-09-28 DIAGNOSIS — W19XXXA Unspecified fall, initial encounter: Secondary | ICD-10-CM | POA: Diagnosis present

## 2022-09-28 DIAGNOSIS — R110 Nausea: Secondary | ICD-10-CM | POA: Diagnosis not present

## 2022-09-28 LAB — BLOOD CULTURE ID PANEL (REFLEXED) - BCID2

## 2022-09-28 LAB — URINE CULTURE: Culture: NO GROWTH

## 2022-09-28 LAB — LEGIONELLA PNEUMOPHILA SEROGP 1 UR AG: L. pneumophila Serogp 1 Ur Ag: NEGATIVE

## 2022-09-28 LAB — GLUCOSE, CAPILLARY: Glucose-Capillary: 100 mg/dL — ABNORMAL HIGH (ref 70–99)

## 2022-09-28 MED ORDER — SODIUM CHLORIDE 0.9 % IV SOLN
INTRAVENOUS | Status: DC | PRN
  Administered 2022-09-28: 200 mL via INTRAVENOUS

## 2022-09-28 NOTE — Evaluation (Signed)
SLP Cancellation Note  Patient Details Name: Andrea [name] MRN: [id number] DOB: January 21, 1935   Cancelled treatment:       Reason Eval/Treat Not Completed: Other (comment) (pt with OT, will continue efforts)   Macario Golds 09/28/2022, 4:31 PM  Kathleen Lime, MS [facility] SLP Acute Rehab Services Office [phone] Pager [phone]

## 2022-09-28 NOTE — Progress Notes (Signed)
PHARMACY - PHYSICIAN COMMUNICATION CRITICAL VALUE ALERT - BLOOD CULTURE IDENTIFICATION (BCID)  Andrea [name] is an 87 y.o. female who presented to [facility] on 09/27/2022 with a chief complaint of Fall at home, weakness and confused   Assessment:  1 of 4 bottles anaerobic: MR Staph epi, probable contaminant  Name of physician (or Provider) Contacted: K Ghimire via  Secure Chat   Current antibiotics: azithromycin & ceftriaxone for CAP  Changes to prescribed antibiotics recommended:  No treatment needed for this. Continue current abx to treat CAP  Results for orders placed or performed during the hospital encounter of 09/27/22  Blood Culture ID Panel (Reflexed) (Collected: 09/27/2022  2:56 PM)  Result Value Ref Range   Enterococcus faecalis NOT DETECTED NOT DETECTED   Enterococcus Faecium NOT DETECTED NOT DETECTED   Listeria monocytogenes NOT DETECTED NOT DETECTED   Staphylococcus species DETECTED (A) NOT DETECTED   Staphylococcus aureus (BCID) NOT DETECTED NOT DETECTED   Staphylococcus epidermidis DETECTED (A) NOT DETECTED   Staphylococcus lugdunensis NOT DETECTED NOT DETECTED   Streptococcus species NOT DETECTED NOT DETECTED   Streptococcus agalactiae NOT DETECTED NOT DETECTED   Streptococcus pneumoniae NOT DETECTED NOT DETECTED   Streptococcus pyogenes NOT DETECTED NOT DETECTED   A.calcoaceticus-baumannii NOT DETECTED NOT DETECTED   Bacteroides fragilis NOT DETECTED NOT DETECTED   Enterobacterales NOT DETECTED NOT DETECTED   Enterobacter cloacae complex NOT DETECTED NOT DETECTED   Escherichia coli NOT DETECTED NOT DETECTED   Klebsiella aerogenes NOT DETECTED NOT DETECTED   Klebsiella oxytoca NOT DETECTED NOT DETECTED   Klebsiella pneumoniae NOT DETECTED NOT DETECTED   Proteus species NOT DETECTED NOT DETECTED   Salmonella species NOT DETECTED NOT DETECTED   Serratia marcescens NOT DETECTED NOT DETECTED   Haemophilus influenzae NOT DETECTED NOT DETECTED   Neisseria  meningitidis NOT DETECTED NOT DETECTED   Pseudomonas aeruginosa NOT DETECTED NOT DETECTED   Stenotrophomonas maltophilia NOT DETECTED NOT DETECTED   Candida albicans NOT DETECTED NOT DETECTED   Candida auris NOT DETECTED NOT DETECTED   Candida glabrata NOT DETECTED NOT DETECTED   Candida krusei NOT DETECTED NOT DETECTED   Candida parapsilosis NOT DETECTED NOT DETECTED   Candida tropicalis NOT DETECTED NOT DETECTED   Cryptococcus neoformans/gattii NOT DETECTED NOT DETECTED   Methicillin resistance mecA/C DETECTED (A) NOT DETECTED    Eudelia Bunch, Pharm.D Use secure chat for questions 09/28/2022 3:02 PM

## 2022-09-28 NOTE — Evaluation (Signed)
Physical Therapy Evaluation Patient Details Name: Andrea [name] MRN: [id number] DOB: Dec 02, 1934 Today's Date: 09/28/2022  History of Present Illness  Pt admitted from home s/p recent fall with increasing weakness and AMS.  Pt with hx of  dementia, bil TKR, afib, breast CA and frequent falls  Clinical Impression  Pt admitted as above and presenting with functional mobility limitations 2* generalized weakness, balance deficits, poor endurance and dementia related cognitive deficits.  Pt very pleasant and following cues but limited this date to standing at side of bed briefly x 2 with significant assist.  Per caregiver present Olegario Shearer), family hopes for pt to return home with 24/7 assist.  However, if family cannot provide assist level needed at time of dc, SNF level rehab may need to be re-considered.     Recommendations for follow up therapy are one component of a multi-disciplinary discharge planning process, led by the attending physician.  Recommendations may be updated based on patient status, additional functional criteria and insurance authorization.  Follow Up Recommendations Home health PT      Assistance Recommended at Discharge Frequent or constant Supervision/Assistance  Patient can return home with the following  Two people to help with walking and/or transfers;A lot of help with bathing/dressing/bathroom;Assistance with cooking/housework;Assist for transportation;Help with stairs or ramp for entrance    Equipment Recommendations None recommended by PT  Recommendations for Other Services       Functional Status Assessment Patient has had a recent decline in their functional status and demonstrates the ability to make significant improvements in function in a reasonable and predictable amount of time.     Precautions / Restrictions Precautions Precautions: Fall Precaution Comments: hx of falls at home Restrictions Weight Bearing Restrictions: No      Mobility  Bed  Mobility Overal bed mobility: Needs Assistance Bed Mobility: Supine to Sit, Sit to Supine     Supine to sit: Mod assist, Max assist, +2 for physical assistance, +2 for safety/equipment Sit to supine: Mod assist, Max assist, +2 for physical assistance, +2 for safety/equipment   General bed mobility comments: Increased time with cues for sequence and assist to manage LEs, control trunk and complete rotation to/from EOB with pad    Transfers Overall transfer level: Needs assistance Equipment used: Rolling walker (2 wheels) Transfers: Sit to/from Stand Sit to Stand: Min assist, Mod assist, +2 physical assistance, +2 safety/equipment, From elevated surface           General transfer comment: Pt stood x 2 with assist to bring wt up and fwd and to balance in standing with RW    Ambulation/Gait               General Gait Details: Pt stood from bed x 2 and able to move feet minially in standing but unable to complete step fwd.  Pt returned to sitting unannounced but landed safety at bedside  Stairs            Wheelchair Mobility    Modified Rankin (Stroke Patients Only)       Balance Overall balance assessment: Needs assistance Sitting-balance support: Feet supported, Bilateral upper extremity supported Sitting balance-[name]: Poor Sitting balance - Comments: drifting fwd and to R                                     Pertinent Vitals/Pain Pain Assessment Pain Assessment: No/denies pain    Home  Living Family/patient expects to be discharged to:: Private residence Living Arrangements: Children Available Help at Discharge: Family;Personal care attendant;Available 24 hours/day Type of Home: House Home Access: Ramped entrance       Home Layout: One level Home Equipment: Conservation officer, nature (2 wheels);BSC/3in1;Wheelchair - manual;Other (comment) (hydraulic bed) Additional Comments: Per pt caregiver Tourist information centre manager) family will wish to take pt home     Prior Function Prior Level of Function : Needs assist             Mobility Comments: Until very recently, pt ambulated limited distances in home with RW and assist but primarily using WC       Hand Dominance        Extremity/Trunk Assessment   Upper Extremity Assessment Upper Extremity Assessment: Defer to OT evaluation    Lower Extremity Assessment Lower Extremity Assessment: Generalized weakness    Cervical / Trunk Assessment Cervical / Trunk Assessment: Kyphotic  Communication   Communication: No difficulties  Cognition Arousal/Alertness: Awake/alert Behavior During Therapy: Flat affect Overall Cognitive Status: History of cognitive impairments - at baseline                                 General Comments: Pt with hx of dementia and recent increase in AMS but caregiver states that seems to be improving        General Comments      Exercises     Assessment/Plan    PT Assessment Patient needs continued PT services  PT Problem List Decreased strength;Decreased activity tolerance;Decreased balance;Decreased mobility;Decreased knowledge of use of DME;Decreased cognition;Decreased safety awareness       PT Treatment Interventions DME instruction;Gait training;Functional mobility training;Therapeutic activities;Therapeutic exercise;Balance training;Patient/family education    PT Goals (Current goals can be found in the Care Plan section)  Acute Rehab PT Goals Patient Stated Goal: Get back to bed.  Caregiver states goal is to get [name] home to family PT Goal Formulation: Patient unable to participate in goal setting Time For Goal Achievement: 10/12/22 Potential to Achieve Goals: Fair    Frequency Min 3X/week     Co-evaluation               AM-PAC PT "6 Clicks" Mobility  Outcome Measure Help needed turning from your back to your side while in a flat bed without using bedrails?: A Lot Help needed moving from lying on your back to  sitting on the side of a flat bed without using bedrails?: A Lot Help needed moving to and from a bed to a chair (including a wheelchair)?: A Lot Help needed standing up from a chair using your arms (e.g., wheelchair or bedside chair)?: A Lot Help needed to walk in hospital room?: Total Help needed climbing 3-5 steps with a railing? : Total 6 Click Score: 10    End of Session Equipment Utilized During Treatment: Gait belt Activity Tolerance: Patient limited by fatigue Patient left: in bed;with call bell/phone within reach;with bed alarm set;with family/visitor present Nurse Communication: Mobility status PT Visit Diagnosis: Unsteadiness on feet (R26.81);Muscle weakness (generalized) (M62.81);Difficulty in walking, not elsewhere classified (R26.2);History of falling (Z91.81);Repeated falls (R29.6)    Time: 1102-1130 PT Time Calculation (min) (ACUTE ONLY): 28 min   Charges:   PT Evaluation $PT Eval Low Complexity: 1 Low PT Treatments $Therapeutic Activity: 8-22 mins        Debe Coder PT Acute Rehabilitation Services Pager [phone] Office [phone]   [facility]  09/28/2022, 1:23 PM

## 2022-09-28 NOTE — Progress Notes (Signed)
PROGRESS NOTE    Andrea [name]  BTD:[id number] DOB: [birth year] DOA: 09/27/2022 PCP: Sandrea Hughs, NP    Brief Narrative:  Patient with multiple medical issues and advanced dementia brought to the ER with more shakiness and somnolence.  Hemodynamically stable.  Found to have left-sided pneumonia.   Assessment & Plan:   Left middle lobe pneumonia: Some clinical response to antibiotic treatment.  Continue Rocephin azithromycin today.   Chest physiotherapy, incentive spirometry, deep breathing exercises, sputum induction, mucolytic's and bronchodilators. Sputum cultures, blood cultures, Legionella and streptococcal antigen, negative so far. Supplemental oxygen to keep saturations more than 90%. Speech therapy evaluation to evaluate for aspiration.   Acute metabolic encephalopathy in a patient with advanced dementia: Patient with advanced dementia features including restlessness and confusion.  Currently stable.  Her symptomatology could be related to advancing dementia symptoms. On multiple medications including brexpiprazole, BuSpar, Klonopin, Cymbalta, Remeron and trazodone at night.   Depression/anxiety/chronic pain syndrome: Patient on trazodone, buspirone, Klonopin.  Continued.   Paroxysmal A-fib: Currently in sinus rhythm.  Therapeutic on Eliquis.   Deconditioning, physical debility: Work with PT OT.   Obstructive sleep apnea: Cannot tolerate CPAP.  Discontinue orders.   DVT prophylaxis:  apixaban (ELIQUIS) tablet 5 mg   Code Status: DNR Family Communication: Daughter at the bedside Disposition Plan: Status is: Observation The patient will require care spanning > 2 midnights and should be moved to inpatient because: Difficulty mobility, IV antibiotics     Consultants:  None  Procedures:  None  Antimicrobials:  Rocephin azithromycin 11/8---   Subjective: This patient was seen and examined.  Her daughter was at the bedside.  No overnight events.   Pleasantly confused but well composed.  Looks comfortable.  Objective: Vitals:   09/27/22 2105 09/28/22 0141 09/28/22 0623 09/28/22 0945  BP: (!) 122/51 122/63 (!) 145/73 (!) 114/48  Pulse: 62 87 81 62  Resp: '18 16 17 17  '$ Temp: 98.8 F (37.1 C) 98 F (36.7 C) 97.7 F (36.5 C) 97.8 F (36.6 C)  TempSrc: Oral  Oral Oral  SpO2: 95% 97% 96% 97%  Weight:      Height:        Intake/Output Summary (Last 24 hours) at 09/28/2022 1057 Last data filed at 09/28/2022 0946 Gross per 24 hour  Intake 1026.4 ml  Output 250 ml  Net 776.4 ml   Filed Weights   09/27/22 1300  Weight: 90 kg    Examination:  General exam: Appears calm and comfortable, on room air. Respiratory system: Clear to auscultation. Respiratory effort normal.  No added sounds Cardiovascular system: S1 & S2 heard, RRR. Gastrointestinal system: Soft.  Nontender. Central nervous system: Alert and awake.  Oriented x0.  Moves all extremities.  Follows simple commands.    Data Reviewed: I have personally reviewed following labs and imaging studies  CBC: Recent Labs  Lab 09/27/22 1309  WBC 9.7  NEUTROABS 7.2  HGB 9.9*  HCT 33.3*  MCV 81.6  PLT 106   Basic Metabolic Panel: Recent Labs  Lab 09/27/22 1309  NA 144  K 4.4  CL 110  CO2 26  GLUCOSE 113*  BUN 23  CREATININE 0.95  CALCIUM 9.6   GFR: Estimated Creatinine Clearance: 45.2 mL/min (by C-G formula based on SCr of 0.95 mg/dL). Liver Function Tests: Recent Labs  Lab 09/27/22 1309  AST 19  ALT 14  ALKPHOS 67  BILITOT 0.6  PROT 6.7  ALBUMIN 3.3*   Recent Labs  Lab 09/27/22 1309  LIPASE 36   No results for input(s): "AMMONIA" in the last 168 hours. Coagulation Profile: No results for input(s): "INR", "PROTIME" in the last 168 hours. Cardiac Enzymes: No results for input(s): "CKTOTAL", "CKMB", "CKMBINDEX", "TROPONINI" in the last 168 hours. BNP (last 3 results) No results for input(s): "PROBNP" in the last 8760 hours. HbA1C: No results  for input(s): "HGBA1C" in the last 72 hours. CBG: Recent Labs  Lab 09/28/22 0746  GLUCAP 100*   Lipid Profile: No results for input(s): "CHOL", "HDL", "LDLCALC", "TRIG", "CHOLHDL", "LDLDIRECT" in the last 72 hours. Thyroid Function Tests: Recent Labs    09/27/22 1437  TSH 0.650   Anemia Panel: No results for input(s): "VITAMINB12", "FOLATE", "FERRITIN", "TIBC", "IRON", "RETICCTPCT" in the last 72 hours. Sepsis Labs: No results for input(s): "PROCALCITON", "LATICACIDVEN" in the last 168 hours.  Recent Results (from the past 240 hour(s))  Resp Panel by RT-PCR (Flu A&B, Covid) Anterior Nasal Swab     Status: None   Collection Time: 09/27/22  2:52 PM   Specimen: Anterior Nasal Swab  Result Value Ref Range Status   SARS Coronavirus 2 by RT PCR NEGATIVE NEGATIVE Final    Comment: (NOTE) SARS-CoV-2 target nucleic acids are NOT DETECTED.  The SARS-CoV-2 RNA is generally detectable in upper respiratory specimens during the acute phase of infection. The lowest concentration of SARS-CoV-2 viral copies this assay can detect is 138 copies/mL. A negative result does not preclude SARS-Cov-2 infection and should not be used as the sole basis for treatment or other patient management decisions. A negative result may occur with  improper specimen collection/handling, submission of specimen other than nasopharyngeal swab, presence of viral mutation(s) within the areas targeted by this assay, and inadequate number of viral copies(<138 copies/mL). A negative result must be combined with clinical observations, patient history, and epidemiological information. The expected result is Negative.  Fact Sheet for Patients:  EntrepreneurPulse.com.au  Fact Sheet for Healthcare Providers:  IncredibleEmployment.be  This test is no t yet approved or cleared by the [place] FDA and  has been authorized for detection and/or diagnosis of SARS-CoV-2 by FDA under  an Emergency Use Authorization (EUA). This EUA will remain  in effect (meaning this test can be used) for the duration of the COVID-19 declaration under Section 564(b)(1) of the Act, 21 U.S.C.section 360bbb-3(b)(1), unless the authorization is terminated  or revoked sooner.       Influenza A by PCR NEGATIVE NEGATIVE Final   Influenza B by PCR NEGATIVE NEGATIVE Final    Comment: (NOTE) The Xpert Xpress SARS-CoV-2/FLU/RSV plus assay is intended as an aid in the diagnosis of influenza from Nasopharyngeal swab specimens and should not be used as a sole basis for treatment. Nasal washings and aspirates are unacceptable for Xpert Xpress SARS-CoV-2/FLU/RSV testing.  Fact Sheet for Patients: EntrepreneurPulse.com.au  Fact Sheet for Healthcare Providers: IncredibleEmployment.be  This test is not yet approved or cleared by the [place] FDA and has been authorized for detection and/or diagnosis of SARS-CoV-2 by FDA under an Emergency Use Authorization (EUA). This EUA will remain in effect (meaning this test can be used) for the duration of the COVID-19 declaration under Section 564(b)(1) of the Act, 21 U.S.C. section 360bbb-3(b)(1), unless the authorization is terminated or revoked.  Performed at [facility], [place] [address] [place], [place] [address]   Blood culture (routine x 2)     Status: None (Preliminary result)   Collection Time: 09/27/22  2:56 PM   Specimen: BLOOD LEFT FOREARM  Result Value Ref Range Status   Specimen Description   Final    BLOOD LEFT FOREARM Performed at [facility], [place] [address], [place], [place] [address]    Special Requests   Final    BOTTLES DRAWN AEROBIC AND ANAEROBIC Blood Culture results may not be optimal due to an excessive volume of blood received in culture bottles Performed at [place] [address] [place], [place] [address]    Culture PENDING   Incomplete   Report Status PENDING  Incomplete  Blood culture (routine x 2)     Status: None (Preliminary result)   Collection Time: 09/27/22  3:05 PM   Specimen: BLOOD RIGHT FOREARM  Result Value Ref Range Status   Specimen Description   Final    BLOOD RIGHT FOREARM Performed at [facility], [place] [address] [place], [place] [address]    Special Requests   Final    BOTTLES DRAWN AEROBIC AND ANAEROBIC Blood Culture adequate volume Performed at [place] [address], [place], [place] [address]    Culture PENDING  Incomplete   Report Status PENDING  Incomplete         Radiology Studies: DG Lumbar Spine Complete  Result Date: 09/27/2022 CLINICAL DATA:  Cough.  Fell last week. EXAM: LUMBAR SPINE - COMPLETE 4+ VIEW COMPARISON:  05/31/2013 FINDINGS: Thoracolumbar curvature convex to the left. Straightening of the normal lumbar lordosis. Previous decompression and fusion from L2 to the sacrum. No acute traumatic finding. IMPRESSION: No acute or traumatic finding. Previous decompression and fusion from L2 to the sacrum. Electronically Signed   By: Nelson Chimes M.D.   On: 09/27/2022 14:22   DG Chest 2 View  Result Date: 09/27/2022 CLINICAL DATA:  [place] last week.  Cough.  Weakness. EXAM: CHEST - 2 VIEW COMPARISON:  04/21/2020 FINDINGS: Patient has taken a poor inspiration. Power port on the right with the tip at the SVC RA junction. Hazy and patchy opacity in the left mid lung suspicious for bronchopneumonia. No dense consolidation or lobar collapse. No visible effusion. IMPRESSION: Poor inspiration. Patchy and hazy opacity in the left mid lung suspicious for bronchopneumonia. Electronically Signed   By: Nelson Chimes M.D.   On: 09/27/2022 14:21   CT Head Wo Contrast  Result Date: 09/27/2022 CLINICAL DATA:  Trauma, fall EXAM: CT HEAD WITHOUT CONTRAST TECHNIQUE: Contiguous axial images were obtained from the base of the skull through the vertex without intravenous  contrast. RADIATION DOSE REDUCTION: This exam was performed according to the departmental dose-optimization program which includes automated exposure control, adjustment of the mA and/or kV according to patient size and/or use of iterative reconstruction technique. COMPARISON:  CT done on 07/29/2019, MR done on 11/11/2021 FINDINGS: Brain: No acute intracranial findings are seen. There are no signs of bleeding within the cranium. Cortical sulci are prominent. There is decreased density in periventricular and subcortical white matter. Vascular: Unremarkable. Skull: Unremarkable. Sinuses/Orbits: Unremarkable. Other: No significant interval changes are noted. IMPRESSION: No acute intracranial findings are seen. Atrophy. Small-vessel disease. Electronically Signed   By: Elmer Picker M.D.   On: 09/27/2022 13:37        Scheduled Meds:  apixaban  5 mg Oral BID   azithromycin  250 mg Oral Daily   brexpiprazole  1 mg Oral QHS   busPIRone  10 mg Oral Daily   And   busPIRone  5 mg Oral QHS   cyanocobalamin  500 mcg Oral Daily  donepezil  10 mg Oral QHS   DULoxetine  30 mg Oral Daily   influenza vaccine adjuvanted  0.5 mL Intramuscular Tomorrow-1000   levothyroxine  50 mcg Oral Q0600   mirtazapine  7.5 mg Oral QHS   oxybutynin  10 mg Oral Daily   traZODone  100 mg Oral QHS   Continuous Infusions:  cefTRIAXone (ROCEPHIN)  IV       LOS: 0 days    Time spent: 35 minutes    Barb Merino, MD [facility] Pager [phone]

## 2022-09-28 NOTE — Evaluation (Signed)
Occupational Therapy Evaluation Patient Details Name: Andrea [name] MRN: [id number] DOB: Feb 20, 1935 Today's Date: 09/28/2022   History of Present Illness Pt admitted from home s/p recent fall with increasing weakness and AMS.  Pt with hx of  dementia, bil TKR, afib, breast CA and frequent falls   Clinical Impression   Mrs. Andrea [name] is an 87 year old woman who presents with above medical history. On evaluation she is near her baseline functionally - needing assistance for all ADLs. She is typically able to feed herself and assist minimally with ADLs. Today her  caregiver reports feeding her but more so because of the type of food. Patient mod assist for bed transfers and mod assist to stand. Min assist to take steps to head of bed. From an OT standpoint patient has no OT needs. Caregiver reports plan is to take patient home at discharge.       Recommendations for follow up therapy are one component of a multi-disciplinary discharge planning process, led by the attending physician.  Recommendations may be updated based on patient status, additional functional criteria and insurance authorization.   Follow Up Recommendations  No OT follow up    Assistance Recommended at Discharge Frequent or constant Supervision/Assistance  Patient can return home with the following A lot of help with walking and/or transfers;A lot of help with bathing/dressing/bathroom;Assistance with cooking/housework;Direct supervision/assist for medications management;Assist for transportation;Help with stairs or ramp for entrance;Direct supervision/assist for financial management;Assistance with feeding    Functional Status Assessment  Patient has not had a recent decline in their functional status  Equipment Recommendations  None recommended by OT    Recommendations for Other Services       Precautions / Restrictions Precautions Precautions: Fall Precaution Comments: hx of falls at home Restrictions Weight  Bearing Restrictions: No      Mobility Bed Mobility                    Transfers                          Balance Overall balance assessment: Needs assistance Sitting-balance support: No upper extremity supported, Feet supported Sitting balance-[name]: Fair Sitting balance - Comments: Initially a posterior lean but improved to fair   Standing balance support: No upper extremity supported, During functional activity Standing balance-[name]: Poor                             ADL either performed or assessed with clinical judgement   ADL Overall ADL's : Needs assistance/impaired Eating/Feeding: Set up;Maximal assistance Eating/Feeding Details (indicate cue type and reason): caregiver stated she fed patient today due to the type of food ordered but patient can typically feed herself Grooming: Set up;Supervision/safety   Upper Body Bathing: Maximal assistance;Sitting   Lower Body Bathing: Total assistance;Sitting/lateral leans   Upper Body Dressing : Maximal assistance;Sitting   Lower Body Dressing: Total assistance;Sit to/from stand   Toilet Transfer: Moderate assistance;BSC/3in1;Rolling walker (2 wheels)   Toileting- Clothing Manipulation and Hygiene: Total assistance;Sit to/from stand         General ADL Comments: Patient mod assist to stand from bed with walker. Able to take steps up to head of bed with min assist and patient exhibiting a posterior lean.     Vision   Vision Assessment?: No apparent visual deficits     Perception     Praxis  Pertinent Vitals/Pain Pain Assessment Pain Assessment: No/denies pain     Hand Dominance Right   Extremity/Trunk Assessment Upper Extremity Assessment Upper Extremity Assessment: Overall WFL for tasks assessed   Lower Extremity Assessment Lower Extremity Assessment: Defer to PT evaluation   Cervical / Trunk Assessment Cervical / Trunk Assessment: Kyphotic    Communication Communication Communication: No difficulties   Cognition Arousal/Alertness: Awake/alert Behavior During Therapy: WFL for tasks assessed/performed Overall Cognitive Status: History of cognitive impairments - at baseline                                       General Comments       Exercises     Shoulder Instructions      Home Living Family/patient expects to be discharged to:: Private residence Living Arrangements: Children Available Help at Discharge: Family;Personal care attendant;Available 24 hours/day Type of Home: House Home Access: Ramped entrance     Home Layout: One level               Home Equipment: Conservation officer, nature (2 wheels);BSC/3in1;Wheelchair - manual;Other (comment) (hydraulic bed)   Additional Comments: Per pt caregiver Tourist information centre manager) family will wish to take pt home      Prior Functioning/Environment Prior Level of Function : Needs assist             Mobility Comments: Until very recently, pt ambulated limited distances in home with RW and assist but primarily using WC ADLs Comments: max assistance for all ADLs except feeding.        OT Problem List: Decreased activity tolerance;Impaired balance (sitting and/or standing);Decreased cognition;Decreased knowledge of use of DME or AE;Decreased strength      OT Treatment/Interventions:      OT Goals(Current goals can be found in the care plan section) Acute Rehab OT Goals OT Goal Formulation: All assessment and education complete, DC therapy  OT Frequency:      Co-evaluation              AM-PAC OT "6 Clicks" Daily Activity     Outcome Measure Help from another person eating meals?: A Lot Help from another person taking care of personal grooming?: A Little Help from another person toileting, which includes using toliet, bedpan, or urinal?: Total Help from another person bathing (including washing, rinsing, drying)?: A Lot Help from another person to put on and  taking off regular upper body clothing?: A Lot Help from another person to put on and taking off regular lower body clothing?: Total 6 Click Score: 11   End of Session Equipment Utilized During Treatment: Rolling walker (2 wheels) Nurse Communication: Mobility status  Activity Tolerance: Patient tolerated treatment well Patient left: in bed;with call bell/phone within reach;with family/visitor present  OT Visit Diagnosis: History of falling (Z91.81)                Time: 9628-3662 OT Time Calculation (min): 16 min Charges:  OT General Charges $OT Visit: 1 Visit OT Evaluation $OT Eval Low Complexity: 1 Low  Gustavo Lah, OTR/L [place]  Office [phone]   Lenward Chancellor 09/28/2022, 3:24 PM

## 2022-09-28 NOTE — Plan of Care (Signed)
Plan of care reviewed. 

## 2022-09-28 NOTE — Plan of Care (Signed)
  Problem: Clinical Measurements: Goal: Respiratory complications will improve Outcome: Progressing   Problem: Nutrition: Goal: Adequate nutrition will be maintained Outcome: Progressing   Problem: Elimination: Goal: Will not experience complications related to urinary retention Outcome: Progressing

## 2022-09-29 DIAGNOSIS — J189 Pneumonia, unspecified organism: Secondary | ICD-10-CM | POA: Diagnosis not present

## 2022-09-29 MED ORDER — BISACODYL 10 MG RE SUPP
10.0000 mg | Freq: Once | RECTAL | Status: AC
  Administered 2022-09-29: 10 mg via RECTAL
  Filled 2022-09-29: qty 1

## 2022-09-29 MED ORDER — POLYETHYLENE GLYCOL 3350 17 G PO PACK
17.0000 g | PACK | Freq: Every day | ORAL | Status: DC
  Administered 2022-09-29: 17 g via ORAL
  Filled 2022-09-29 (×2): qty 1

## 2022-09-29 MED ORDER — ONDANSETRON HCL 4 MG PO TABS
4.0000 mg | ORAL_TABLET | Freq: Three times a day (TID) | ORAL | Status: DC | PRN
  Administered 2022-09-29: 4 mg via ORAL
  Filled 2022-09-29: qty 1

## 2022-09-29 NOTE — Plan of Care (Signed)

## 2022-09-29 NOTE — Evaluation (Signed)
SLP Cancellation Note  Patient Details Name: Andrea [name] MRN: [id number] DOB: 1935-07-14   Cancelled treatment:       Reason Eval/Treat Not Completed: Other (comment);Medical issues which prohibited therapy (pt reports nausea, alerted RN, pt denies to consume po intake at this time)   Macario Golds 09/29/2022, 9:34 AM   Kathleen Lime, MS [place] Office [phone] Pager [phone]

## 2022-09-29 NOTE — Evaluation (Addendum)
SLP Cancellation Note  Patient Details Name: Andrea [name] MRN: [id number] DOB: 06-25-1935   Cancelled treatment:       Reason Eval/Treat Not Completed: Other (comment);Patient at procedure or test/unavailable (pt working with staff at this time - 2nd attempt to see pt today - will continue efforts)  RN reports nausea had resolved and pt took medications without issues.    Kathleen Lime, MS [facility] SLP Acute Rehab Services Office [phone] Pager [phone]   Macario Golds 09/29/2022, 3:50 PM

## 2022-09-29 NOTE — Plan of Care (Signed)
Plan of care reviewed. 

## 2022-09-29 NOTE — Progress Notes (Signed)
PROGRESS NOTE    Andrea [name]  WLN:[id number] DOB: 1935-06-21 DOA: 09/27/2022 PCP: Sandrea Hughs, NP    Brief Narrative:  Patient with multiple medical issues and advanced dementia brought to the ER with more shakiness and somnolence.  Hemodynamically stable.  Found to have left-sided pneumonia. Remains in the hospital, nauseous.   Assessment & Plan:   Left middle lobe pneumonia: Some clinical response to antibiotic treatment.  Continue Rocephin azithromycin today.   Chest physiotherapy, incentive spirometry, deep breathing exercises, sputum induction, mucolytic's and bronchodilators. Sputum cultures, blood cultures, Legionella and streptococcal antigen, negative so far. Supplemental oxygen to keep saturations more than 90%. Speech therapy evaluation to evaluate for aspiration.   Acute metabolic encephalopathy in a patient with advanced dementia: Patient with advanced dementia features including restlessness and confusion.  Currently stable.  Her symptomatology could be related to advancing dementia symptoms. On multiple medications including brexpiprazole, BuSpar, Klonopin, Cymbalta, Remeron and trazodone at night.   Depression/anxiety/chronic pain syndrome: Patient on trazodone, buspirone, Klonopin.  Continued.   Paroxysmal A-fib: Currently in sinus rhythm.  Therapeutic on Eliquis.   Deconditioning, physical debility: Work with PT OT.  Has adequate support system at home.   Obstructive sleep apnea: Cannot tolerate CPAP.  Discontinue orders.  Nausea and constipation: Aggressive bowel regimen today.   DVT prophylaxis:  apixaban (ELIQUIS) tablet 5 mg   Code Status: DNR Family Communication: Daughter at the bedside Disposition Plan: Status is: Inpatient.  Nausea, IV antibiotics.     Consultants:  None  Procedures:  None  Antimicrobials:  Rocephin azithromycin 11/8---   Subjective: Patient seen and examined.  Daughter and caretaker at the bedside.  Patient  did well yesterday, today morning she is tells me that she does not feel well.  Complained of nausea.  No bowel movement since admission.  Remains afebrile and on room air.  Objective: Vitals:   09/28/22 0945 09/28/22 1339 09/28/22 2116 09/29/22 0452  BP: (!) 114/48 (!) 108/43 (!) 114/47 129/68  Pulse: 62 66 63 82  Resp: '17 17 16 18  '$ Temp: 97.8 F (36.6 C) 98.6 F (37 C) 99.5 F (37.5 C) (!) 97.4 F (36.3 C)  TempSrc: Oral Oral    SpO2: 97% 98% 93% 98%  Weight:      Height:        Intake/Output Summary (Last 24 hours) at 09/29/2022 1029 Last data filed at 09/29/2022 0700 Gross per 24 hour  Intake 496.11 ml  Output --  Net 496.11 ml    Filed Weights   09/27/22 1300  Weight: 90 kg    Examination:  General exam: Appears calm and comfortable, on room air.  Appropriately anxious today. Respiratory system: Clear to auscultation. Respiratory effort normal.  No added sounds Cardiovascular system: S1 & S2 heard, RRR. Gastrointestinal system: Soft.  Nontender. Central nervous system: Alert and awake.  Not oriented.  Moves all extremities.  Follows simple commands.    Data Reviewed: I have personally reviewed following labs and imaging studies  CBC: Recent Labs  Lab 09/27/22 1309  WBC 9.7  NEUTROABS 7.2  HGB 9.9*  HCT 33.3*  MCV 81.6  PLT 740    Basic Metabolic Panel: Recent Labs  Lab 09/27/22 1309  NA 144  K 4.4  CL 110  CO2 26  GLUCOSE 113*  BUN 23  CREATININE 0.95  CALCIUM 9.6    GFR: Estimated Creatinine Clearance: 45.2 mL/min (by C-G formula based on SCr of 0.95 mg/dL). Liver Function Tests: Recent Labs  Lab 09/27/22 1309  AST 19  ALT 14  ALKPHOS 67  BILITOT 0.6  PROT 6.7  ALBUMIN 3.3*    Recent Labs  Lab 09/27/22 1309  LIPASE 36    No results for input(s): "AMMONIA" in the last 168 hours. Coagulation Profile: No results for input(s): "INR", "PROTIME" in the last 168 hours. Cardiac Enzymes: No results for input(s): "CKTOTAL",  "CKMB", "CKMBINDEX", "TROPONINI" in the last 168 hours. BNP (last 3 results) No results for input(s): "PROBNP" in the last 8760 hours. HbA1C: No results for input(s): "HGBA1C" in the last 72 hours. CBG: Recent Labs  Lab 09/28/22 0746  GLUCAP 100*    Lipid Profile: No results for input(s): "CHOL", "HDL", "LDLCALC", "TRIG", "CHOLHDL", "LDLDIRECT" in the last 72 hours. Thyroid Function Tests: Recent Labs    09/27/22 1437  TSH 0.650    Anemia Panel: No results for input(s): "VITAMINB12", "FOLATE", "FERRITIN", "TIBC", "IRON", "RETICCTPCT" in the last 72 hours. Sepsis Labs: No results for input(s): "PROCALCITON", "LATICACIDVEN" in the last 168 hours.  Recent Results (from the past 240 hour(s))  Resp Panel by RT-PCR (Flu A&B, Covid) Anterior Nasal Swab     Status: None   Collection Time: 09/27/22  2:52 PM   Specimen: Anterior Nasal Swab  Result Value Ref Range Status   SARS Coronavirus 2 by RT PCR NEGATIVE NEGATIVE Final    Comment: (NOTE) SARS-CoV-2 target nucleic acids are NOT DETECTED.  The SARS-CoV-2 RNA is generally detectable in upper respiratory specimens during the acute phase of infection. The lowest concentration of SARS-CoV-2 viral copies this assay can detect is 138 copies/mL. A negative result does not preclude SARS-Cov-2 infection and should not be used as the sole basis for treatment or other patient management decisions. A negative result may occur with  improper specimen collection/handling, submission of specimen other than nasopharyngeal swab, presence of viral mutation(s) within the areas targeted by this assay, and inadequate number of viral copies(<138 copies/mL). A negative result must be combined with clinical observations, patient history, and epidemiological information. The expected result is Negative.  Fact Sheet for Patients:  EntrepreneurPulse.com.au  Fact Sheet for Healthcare Providers:   IncredibleEmployment.be  This test is no t yet approved or cleared by the [place] FDA and  has been authorized for detection and/or diagnosis of SARS-CoV-2 by FDA under an Emergency Use Authorization (EUA). This EUA will remain  in effect (meaning this test can be used) for the duration of the COVID-19 declaration under Section 564(b)(1) of the Act, 21 U.S.C.section 360bbb-3(b)(1), unless the authorization is terminated  or revoked sooner.       Influenza A by PCR NEGATIVE NEGATIVE Final   Influenza B by PCR NEGATIVE NEGATIVE Final    Comment: (NOTE) The Xpert Xpress SARS-CoV-2/FLU/RSV plus assay is intended as an aid in the diagnosis of influenza from Nasopharyngeal swab specimens and should not be used as a sole basis for treatment. Nasal washings and aspirates are unacceptable for Xpert Xpress SARS-CoV-2/FLU/RSV testing.  Fact Sheet for Patients: EntrepreneurPulse.com.au  Fact Sheet for Healthcare Providers: IncredibleEmployment.be  This test is not yet approved or cleared by the [place] FDA and has been authorized for detection and/or diagnosis of SARS-CoV-2 by FDA under an Emergency Use Authorization (EUA). This EUA will remain in effect (meaning this test can be used) for the duration of the COVID-19 declaration under Section 564(b)(1) of the Act, 21 U.S.C. section 360bbb-3(b)(1), unless the authorization is terminated or revoked.  Performed at [facility], [place]  [address] [place], [place] [address]   Blood culture (routine x 2)     Status: None (Preliminary result)   Collection Time: 09/27/22  2:56 PM   Specimen: BLOOD LEFT FOREARM  Result Value Ref Range Status   Specimen Description   Final    BLOOD LEFT FOREARM Performed at [facility], [place] [address] [place], [place] [address]    Special Requests   Final    BOTTLES DRAWN AEROBIC AND ANAEROBIC Blood Culture results  may not be optimal due to an excessive volume of blood received in culture bottles Performed at [place] [address], [place], [place] [address]    Culture  Setup Time   Final    GRAM POSITIVE COCCI IN CLUSTERS ANAEROBIC BOTTLE ONLY CRITICAL RESULT CALLED TO, READ BACK BY AND VERIFIED WITH: PHARMD M BELL [id number] AT 47 BY CM    Culture   Final    GRAM POSITIVE COCCI IDENTIFICATION TO FOLLOW Performed at [facility], [place] [address] [place],  [address]    Report Status PENDING  Incomplete  Blood Culture ID Panel (Reflexed)     Status: Abnormal   Collection Time: 09/27/22  2:56 PM  Result Value Ref Range Status   Enterococcus faecalis NOT DETECTED NOT DETECTED Final   Enterococcus Faecium NOT DETECTED NOT DETECTED Final   Listeria monocytogenes NOT DETECTED NOT DETECTED Final   Staphylococcus species DETECTED (A) NOT DETECTED Final    Comment: CRITICAL RESULT CALLED TO, READ BACK BY AND VERIFIED WITH: PHARMD M BELL [id number] AT 1442 BY CM    Staphylococcus aureus (BCID) NOT DETECTED NOT DETECTED Final   Staphylococcus epidermidis DETECTED (A) NOT DETECTED Final    Comment: Methicillin (oxacillin) resistant coagulase negative staphylococcus. Possible blood culture contaminant (unless isolated from more than one blood culture draw or clinical case suggests pathogenicity). No antibiotic treatment is indicated for blood  culture contaminants. CRITICAL RESULT CALLED TO, READ BACK BY AND VERIFIED WITH: PHARMD M BELL [id number] AT 1443 BY CM    Staphylococcus lugdunensis NOT DETECTED NOT DETECTED Final   Streptococcus species NOT DETECTED NOT DETECTED Final   Streptococcus agalactiae NOT DETECTED NOT DETECTED Final   Streptococcus pneumoniae NOT DETECTED NOT DETECTED Final   Streptococcus pyogenes NOT DETECTED NOT DETECTED Final   A.calcoaceticus-baumannii NOT DETECTED NOT DETECTED Final   Bacteroides fragilis NOT DETECTED NOT DETECTED Final    Enterobacterales NOT DETECTED NOT DETECTED Final   Enterobacter cloacae complex NOT DETECTED NOT DETECTED Final   Escherichia coli NOT DETECTED NOT DETECTED Final   Klebsiella aerogenes NOT DETECTED NOT DETECTED Final   Klebsiella oxytoca NOT DETECTED NOT DETECTED Final   Klebsiella pneumoniae NOT DETECTED NOT DETECTED Final   Proteus species NOT DETECTED NOT DETECTED Final   Salmonella species NOT DETECTED NOT DETECTED Final   Serratia marcescens NOT DETECTED NOT DETECTED Final   Haemophilus influenzae NOT DETECTED NOT DETECTED Final   Neisseria meningitidis NOT DETECTED NOT DETECTED Final   Pseudomonas aeruginosa NOT DETECTED NOT DETECTED Final   Stenotrophomonas maltophilia NOT DETECTED NOT DETECTED Final   Candida albicans NOT DETECTED NOT DETECTED Final   Candida auris NOT DETECTED NOT DETECTED Final   Candida glabrata NOT DETECTED NOT DETECTED Final   Candida krusei NOT DETECTED NOT DETECTED Final   Candida parapsilosis NOT DETECTED NOT DETECTED Final   Candida tropicalis NOT DETECTED NOT DETECTED Final   Cryptococcus neoformans/gattii NOT DETECTED NOT DETECTED Final   Methicillin resistance mecA/C DETECTED (  A) NOT DETECTED Final    Comment: CRITICAL RESULT CALLED TO, READ BACK BY AND VERIFIED WITH: Sanda Klein [id number] AT 57 BY CM Performed at [facility], [place] [address] [place], [place] [address]   Blood culture (routine x 2)     Status: None (Preliminary result)   Collection Time: 09/27/22  3:05 PM   Specimen: BLOOD RIGHT FOREARM  Result Value Ref Range Status   Specimen Description   Final    BLOOD RIGHT FOREARM Performed at [facility], [place] [address], [place], [place] [address]    Special Requests   Final    BOTTLES DRAWN AEROBIC AND ANAEROBIC Blood Culture adequate volume Performed at [place] [address], [place], [place] [address]    Culture   Final    NO GROWTH 2 DAYS Performed at [place] [address], [place], [place] [address]    Report Status PENDING  Incomplete  Urine Culture     Status: None   Collection Time: 09/27/22  5:03 PM   Specimen: Urine, Clean Catch  Result Value Ref Range Status   Specimen Description   Final    URINE, CLEAN CATCH Performed at [facility], [place] [address] [place], [place] [address]    Special Requests   Final    NONE Performed at [facility], [place] [address], [place], [place] [address]    Culture   Final    NO GROWTH Performed at [facility], [place] [address] [place], [place] [address]    Report Status 09/28/2022 FINAL  Final         Radiology Studies: DG Lumbar Spine Complete  Result Date: 09/27/2022 CLINICAL DATA:  Cough.  Fell last week. EXAM: LUMBAR SPINE - COMPLETE 4+ VIEW COMPARISON:  05/31/2013 FINDINGS: Thoracolumbar curvature convex to the left. Straightening of the normal lumbar lordosis. Previous decompression and fusion from L2 to the sacrum. No acute traumatic finding. IMPRESSION: No acute or traumatic finding. Previous decompression and fusion from L2 to the sacrum. Electronically Signed   By: Nelson Chimes M.D.   On: 09/27/2022 14:22   DG Chest 2 View  Result Date: 09/27/2022 CLINICAL DATA:  [place] last week.  Cough.  Weakness. EXAM: CHEST - 2 VIEW COMPARISON:  04/21/2020 FINDINGS: Patient has taken a poor inspiration. Power port on the right with the tip at the SVC RA junction. Hazy and patchy opacity in the left mid lung suspicious for bronchopneumonia. No dense consolidation or lobar collapse. No visible effusion. IMPRESSION: Poor inspiration. Patchy and hazy opacity in the left mid lung suspicious for bronchopneumonia. Electronically Signed   By: Nelson Chimes M.D.   On: 09/27/2022 14:21   CT Head Wo Contrast  Result Date: 09/27/2022 CLINICAL DATA:  Trauma, fall EXAM: CT HEAD WITHOUT CONTRAST TECHNIQUE: Contiguous axial images were obtained from the base of the skull through the vertex  without intravenous contrast. RADIATION DOSE REDUCTION: This exam was performed according to the departmental dose-optimization program which includes automated exposure control, adjustment of the mA and/or kV according to patient size and/or use of iterative reconstruction technique. COMPARISON:  CT done on 07/29/2019, MR done on 11/11/2021 FINDINGS: Brain: No acute intracranial findings are seen. There are no signs of bleeding within the cranium. Cortical sulci are prominent. There is decreased density in periventricular and subcortical white matter. Vascular: Unremarkable. Skull: Unremarkable. Sinuses/Orbits: Unremarkable. Other: No significant interval changes are noted. IMPRESSION:  No acute intracranial findings are seen. Atrophy. Small-vessel disease. Electronically Signed   By: Elmer Picker M.D.   On: 09/27/2022 13:37        Scheduled Meds:  apixaban  5 mg Oral BID   azithromycin  250 mg Oral Daily   brexpiprazole  1 mg Oral QHS   busPIRone  10 mg Oral Daily   And   busPIRone  5 mg Oral QHS   cyanocobalamin  500 mcg Oral Daily   donepezil  10 mg Oral QHS   DULoxetine  30 mg Oral Daily   influenza vaccine adjuvanted  0.5 mL Intramuscular Tomorrow-1000   levothyroxine  50 mcg Oral Q0600   mirtazapine  7.5 mg Oral QHS   oxybutynin  10 mg Oral Daily   polyethylene glycol  17 g Oral Daily   traZODone  100 mg Oral QHS   Continuous Infusions:  sodium chloride 10 mL/hr at 09/28/22 1540   cefTRIAXone (ROCEPHIN)  IV Stopped (09/28/22 1525)     LOS: 1 day    Time spent: 35 minutes    Barb Merino, MD [facility] Pager [phone]

## 2022-09-30 ENCOUNTER — Other Ambulatory Visit: Payer: Self-pay | Admitting: Family

## 2022-09-30 DIAGNOSIS — Z7901 Long term (current) use of anticoagulants: Secondary | ICD-10-CM

## 2022-09-30 DIAGNOSIS — G9341 Metabolic encephalopathy: Secondary | ICD-10-CM | POA: Diagnosis not present

## 2022-09-30 LAB — CULTURE, BLOOD (ROUTINE X 2)

## 2022-09-30 LAB — GLUCOSE, CAPILLARY: Glucose-Capillary: 101 mg/dL — ABNORMAL HIGH (ref 70–99)

## 2022-09-30 MED ORDER — AMOXICILLIN-POT CLAVULANATE 875-125 MG PO TABS: 1.0000 | ORAL_TABLET | Freq: Two times a day (BID) | ORAL | 0 refills | Status: AC

## 2022-09-30 NOTE — Plan of Care (Signed)
  Problem: Health Behavior/Discharge Planning: Goal: Ability to manage health-related needs will improve Outcome: Completed/Met   Problem: Clinical Measurements: Goal: Ability to maintain clinical measurements within normal limits will improve Outcome: Completed/Met Goal: Will remain free from infection Outcome: Completed/Met Goal: Diagnostic test results will improve Outcome: Completed/Met Goal: Respiratory complications will improve Outcome: Completed/Met Goal: Cardiovascular complication will be avoided Outcome: Completed/Met   Problem: Activity: Goal: Risk for activity intolerance will decrease Outcome: Completed/Met   Problem: Nutrition: Goal: Adequate nutrition will be maintained Outcome: Completed/Met   Problem: Coping: Goal: Level of anxiety will decrease Outcome: Completed/Met   Problem: Elimination: Goal: Will not experience complications related to bowel motility Outcome: Completed/Met   Problem: Pain Managment: Goal: General experience of comfort will improve Outcome: Completed/Met   Problem: Safety: Goal: Ability to remain free from injury will improve Outcome: Completed/Met   Problem: Skin Integrity: Goal: Risk for impaired skin integrity will decrease Outcome: Completed/Met   Problem: Activity: Goal: Ability to tolerate increased activity will improve Outcome: Completed/Met   Problem: Clinical Measurements: Goal: Ability to maintain a body temperature in the normal range will improve Outcome: Completed/Met   Problem: Respiratory: Goal: Ability to maintain adequate ventilation will improve Outcome: Completed/Met Goal: Ability to maintain a clear airway will improve Outcome: Completed/Met

## 2022-09-30 NOTE — Discharge Summary (Signed)
Physician Discharge Summary  Andrea [name] YKD:[id number] DOB: [birth year] DOA: 09/27/2022  PCP: Sandrea Hughs, NP  Admit date: 09/27/2022 Discharge date: 09/30/2022  Admitted From: Home Disposition: Home with home health  Recommendations for Outpatient Follow-up:  Follow up with PCP in 1-2 weeks   Home Health: PT/OT Equipment/Devices: Available at home  Discharge Condition: Stable CODE STATUS: DNR Diet recommendation: Regular diet  Discharge summary: Patient with multiple medical issues and advanced dementia brought to the ER with more shakiness and somnolence. Hemodynamically stable.  Found to have left-sided pneumonia. Symptomatically treated with IV fluids and antibiotics.  Clinically improved now.  Left middle lobe pneumonia: Good clinical response to antibiotic treatment.  Treated with Rocephin and azithromycin for 3 days.  We will continue Augmentin for 4 additional days.  All cultures were negative.  Patient is fairly asymptomatic with occasional cough now.  COVID-19 and influenza swab was negative.  No obvious aspiration.   Acute metabolic encephalopathy in a patient with advanced dementia: Patient with advanced dementia features including restlessness and confusion.  Currently stable.  Her symptomatology could be related to advancing dementia symptoms. On multiple medications including brexpiprazole, BuSpar, Klonopin, Cymbalta, Remeron and trazodone at night.   Depression/anxiety/chronic pain syndrome: Patient on trazodone, buspirone, Klonopin.  Continued.   Paroxysmal A-fib: Currently in sinus rhythm.  Therapeutic on Eliquis.  Continued.   Deconditioning, physical debility: Work with PT OT.  Has adequate support system at home.  She will benefit with working with PT OT at home and this will be ordered.   Stable for discharge with family.   Discharge Diagnoses:  Principal Problem:   Pneumonia Active Problems:   DEPRESSION/ANXIETY   Long term (current) use of  anticoagulants   Hyperlipidemia associated with type 2 diabetes mellitus ([place])   Essential (primary) hypertension   Acquired hypothyroidism   OSA on CPAP    Discharge Instructions  Discharge Instructions     Diet general   Complete by: As directed    Increase activity slowly   Complete by: As directed       Allergies as of 09/30/2022       Reactions   Clindamycin Hcl Other (See Comments)   REACTION: swelling, pt. Reports that she had a 16 lb. weightgain in one day        Medication List     STOP taking these medications    nitrofurantoin (macrocrystal-monohydrate) 100 MG capsule Commonly known as: MACROBID       TAKE these medications    albuterol 108 (90 Base) MCG/ACT inhaler Commonly known as: VENTOLIN HFA Inhale 2 puffs into the lungs every 6 (six) hours as needed for wheezing or shortness of breath.   amoxicillin-clavulanate 875-125 MG tablet Commonly known as: AUGMENTIN Take 1 tablet by mouth 2 (two) times daily for 4 days.   brexpiprazole 2 MG Tabs tablet Commonly known as: REXULTI Take 2 mg by mouth at bedtime.   busPIRone 5 MG tablet Commonly known as: BUSPAR Take 5-10 mg by mouth 2 (two) times daily. 10 MG IN THE MORNING, 5 MG AT DINNER   CALCIUM CITRATE PO Take 600 mg by mouth daily.   clonazePAM 0.5 MG tablet Commonly known as: KLONOPIN Take 1 tablet by mouth as needed in middle of the nite to go back to sleep What changed:  how much to take how to take this when to take this additional instructions   CRANBERRY PO Take 2 tablets by mouth daily.   D-Mannose 500 MG Caps  Take by mouth daily.   donepezil 10 MG tablet Commonly known as: ARICEPT Take 1 tablet (10 mg total) by mouth at bedtime. Take 1  tablet at 10 mg daily   DULoxetine 30 MG capsule Commonly known as: CYMBALTA Take 90 mg by mouth daily.   Eliquis 5 MG Tabs tablet Generic drug: apixaban TAKE 1 TABLET BY MOUTH TWICE A DAY What changed: how much to take    furosemide 20 MG tablet Commonly known as: LASIX TAKE 1 TABLET BY MOUTH EVERY DAY   Klor-Con M20 20 MEQ tablet Generic drug: potassium chloride SA TAKE 1 TABLET BY MOUTH EVERY DAY What changed: how much to take   levothyroxine 50 MCG tablet Commonly known as: SYNTHROID TAKE 1 TABLET BY MOUTH DAILY BEFORE BREAKFAST. DX E03.9 What changed: See the new instructions.   mirtazapine 15 MG tablet Commonly known as: REMERON Take 7.5 mg by mouth at bedtime.   MULTIVITAMIN PO Take 1 tablet by mouth daily.   oxybutynin 10 MG 24 hr tablet Commonly known as: DITROPAN-XL Take 10 mg by mouth daily.   PROBIOTIC DAILY PO Take 1 tablet by mouth daily. OTC   senna-docusate 8.6-50 MG tablet Commonly known as: Senokot-S Take 1 tablet by mouth 2 (two) times daily as needed for mild constipation.   traZODone 100 MG tablet Commonly known as: DESYREL Take 100 mg by mouth at bedtime.   trimethoprim 100 MG tablet Commonly known as: TRIMPEX Take 100 mg by mouth at bedtime.   vitamin B-12 500 MCG tablet Commonly known as: CYANOCOBALAMIN Take 500 mcg by mouth daily.   Vitamin D (Ergocalciferol) 1.25 MG (50000 UNIT) Caps capsule Commonly known as: DRISDOL Take 50,000 Units by mouth once a week.        Allergies  Allergen Reactions   Clindamycin Hcl Other (See Comments)    REACTION: swelling, pt. Reports that she had a 16 lb. weightgain in one day    Consultations: None   Procedures/Studies: DG Lumbar Spine Complete  Result Date: 09/27/2022 CLINICAL DATA:  Cough.  Fell last week. EXAM: LUMBAR SPINE - COMPLETE 4+ VIEW COMPARISON:  05/31/2013 FINDINGS: Thoracolumbar curvature convex to the left. Straightening of the normal lumbar lordosis. Previous decompression and fusion from L2 to the sacrum. No acute traumatic finding. IMPRESSION: No acute or traumatic finding. Previous decompression and fusion from L2 to the sacrum. Electronically Signed   By: Nelson Chimes M.D.   On: 09/27/2022  14:22   DG Chest 2 View  Result Date: 09/27/2022 CLINICAL DATA:  [place] last week.  Cough.  Weakness. EXAM: CHEST - 2 VIEW COMPARISON:  04/21/2020 FINDINGS: Patient has taken a poor inspiration. Power port on the right with the tip at the SVC RA junction. Hazy and patchy opacity in the left mid lung suspicious for bronchopneumonia. No dense consolidation or lobar collapse. No visible effusion. IMPRESSION: Poor inspiration. Patchy and hazy opacity in the left mid lung suspicious for bronchopneumonia. Electronically Signed   By: Nelson Chimes M.D.   On: 09/27/2022 14:21   CT Head Wo Contrast  Result Date: 09/27/2022 CLINICAL DATA:  Trauma, fall EXAM: CT HEAD WITHOUT CONTRAST TECHNIQUE: Contiguous axial images were obtained from the base of the skull through the vertex without intravenous contrast. RADIATION DOSE REDUCTION: This exam was performed according to the departmental dose-optimization program which includes automated exposure control, adjustment of the mA and/or kV according to patient size and/or use of iterative reconstruction technique. COMPARISON:  CT done on 07/29/2019, MR done on  11/11/2021 FINDINGS: Brain: No acute intracranial findings are seen. There are no signs of bleeding within the cranium. Cortical sulci are prominent. There is decreased density in periventricular and subcortical white matter. Vascular: Unremarkable. Skull: Unremarkable. Sinuses/Orbits: Unremarkable. Other: No significant interval changes are noted. IMPRESSION: No acute intracranial findings are seen. Atrophy. Small-vessel disease. Electronically Signed   By: Elmer Picker M.D.   On: 09/27/2022 13:37   (Echo, Carotid, EGD, Colonoscopy, ERCP)    Subjective: Patient seen in the morning rounds.  Daughter at the bedside.  Patient herself is pleasant.  Denies any complaints.  No overnight events.  She was able to have a bowel movement with a dose of MiraLAX and Dulcolax suppository.  No more nausea.   Discharge  Exam: Vitals:   09/29/22 2134 09/30/22 0611  BP: (!) 114/40 138/75  Pulse: 76 67  Resp: 18 18  Temp: 99.2 F (37.3 C) 97.7 F (36.5 C)  SpO2: 95% 97%   Vitals:   09/29/22 0452 09/29/22 1312 09/29/22 2134 09/30/22 0611  BP: 129/68 (!) 147/50 (!) 114/40 138/75  Pulse: 82 73 76 67  Resp: '18 16 18 18  '$ Temp: (!) 97.4 F (36.3 C) 97.8 F (36.6 C) 99.2 F (37.3 C) 97.7 F (36.5 C)  TempSrc:  Oral Oral Oral  SpO2: 98% 100% 95% 97%  Weight:      Height:        General: Pt is alert, awake, not in acute distress Patient is not oriented.  She is very well composed and calm. Cardiovascular: RRR, S1/S2 +, no rubs, no gallops Respiratory: CTA bilaterally, no wheezing, no rhonchi Abdominal: Soft, NT, ND, bowel sounds + Extremities: no edema, no cyanosis    The results of significant diagnostics from this hospitalization (including imaging, microbiology, ancillary and laboratory) are listed below for reference.     Microbiology: Recent Results (from the past 240 hour(s))  Resp Panel by RT-PCR (Flu A&B, Covid) Anterior Nasal Swab     Status: None   Collection Time: 09/27/22  2:52 PM   Specimen: Anterior Nasal Swab  Result Value Ref Range Status   SARS Coronavirus 2 by RT PCR NEGATIVE NEGATIVE Final    Comment: (NOTE) SARS-CoV-2 target nucleic acids are NOT DETECTED.  The SARS-CoV-2 RNA is generally detectable in upper respiratory specimens during the acute phase of infection. The lowest concentration of SARS-CoV-2 viral copies this assay can detect is 138 copies/mL. A negative result does not preclude SARS-Cov-2 infection and should not be used as the sole basis for treatment or other patient management decisions. A negative result may occur with  improper specimen collection/handling, submission of specimen other than nasopharyngeal swab, presence of viral mutation(s) within the areas targeted by this assay, and inadequate number of viral copies(<138 copies/mL). A negative  result must be combined with clinical observations, patient history, and epidemiological information. The expected result is Negative.  Fact Sheet for Patients:  EntrepreneurPulse.com.au  Fact Sheet for Healthcare Providers:  IncredibleEmployment.be  This test is no t yet approved or cleared by the [place] FDA and  has been authorized for detection and/or diagnosis of SARS-CoV-2 by FDA under an Emergency Use Authorization (EUA). This EUA will remain  in effect (meaning this test can be used) for the duration of the COVID-19 declaration under Section 564(b)(1) of the Act, 21 U.S.C.section 360bbb-3(b)(1), unless the authorization is terminated  or revoked sooner.       Influenza A by PCR NEGATIVE NEGATIVE Final   Influenza B by  PCR NEGATIVE NEGATIVE Final    Comment: (NOTE) The Xpert Xpress SARS-CoV-2/FLU/RSV plus assay is intended as an aid in the diagnosis of influenza from Nasopharyngeal swab specimens and should not be used as a sole basis for treatment. Nasal washings and aspirates are unacceptable for Xpert Xpress SARS-CoV-2/FLU/RSV testing.  Fact Sheet for Patients: EntrepreneurPulse.com.au  Fact Sheet for Healthcare Providers: IncredibleEmployment.be  This test is not yet approved or cleared by the [place] FDA and has been authorized for detection and/or diagnosis of SARS-CoV-2 by FDA under an Emergency Use Authorization (EUA). This EUA will remain in effect (meaning this test can be used) for the duration of the COVID-19 declaration under Section 564(b)(1) of the Act, 21 U.S.C. section 360bbb-3(b)(1), unless the authorization is terminated or revoked.  Performed at [facility], [place] [address] [place], [place] [address]   Blood culture (routine x 2)     Status: Abnormal (Preliminary result)   Collection Time: 09/27/22  2:56 PM   Specimen: BLOOD LEFT FOREARM   Result Value Ref Range Status   Specimen Description   Final    BLOOD LEFT FOREARM Performed at [facility], [place] [address], [place], [place] [address]    Special Requests   Final    BOTTLES DRAWN AEROBIC AND ANAEROBIC Blood Culture results may not be optimal due to an excessive volume of blood received in culture bottles Performed at [place] [address], [place], [place] [address]    Culture  Setup Time   Final    GRAM POSITIVE COCCI IN CLUSTERS ANAEROBIC BOTTLE ONLY CRITICAL RESULT CALLED TO, READ BACK BY AND VERIFIED WITH: PHARMD M BELL [id number] AT 70 BY CM    Culture (A)  Final    STAPHYLOCOCCUS EPIDERMIDIS THE SIGNIFICANCE OF ISOLATING THIS ORGANISM FROM A SINGLE SET OF BLOOD CULTURES WHEN MULTIPLE SETS ARE DRAWN IS UNCERTAIN. PLEASE NOTIFY THE MICROBIOLOGY DEPARTMENT WITHIN ONE WEEK IF SPECIATION AND SENSITIVITIES ARE REQUIRED. Performed at [facility], [place] [address], [place], [place] [address]    Report Status PENDING  Incomplete  Blood Culture ID Panel (Reflexed)     Status: Abnormal   Collection Time: 09/27/22  2:56 PM  Result Value Ref Range Status   Enterococcus faecalis NOT DETECTED NOT DETECTED Final   Enterococcus Faecium NOT DETECTED NOT DETECTED Final   Listeria monocytogenes NOT DETECTED NOT DETECTED Final   Staphylococcus species DETECTED (A) NOT DETECTED Final    Comment: CRITICAL RESULT CALLED TO, READ BACK BY AND VERIFIED WITH: PHARMD M BELL [id number] AT 1442 BY CM    Staphylococcus aureus (BCID) NOT DETECTED NOT DETECTED Final   Staphylococcus epidermidis DETECTED (A) NOT DETECTED Final    Comment: Methicillin (oxacillin) resistant coagulase negative staphylococcus. Possible blood culture contaminant (unless isolated from more than one blood culture draw or clinical case suggests pathogenicity). No antibiotic treatment is indicated for blood  culture contaminants. CRITICAL RESULT CALLED TO, READ BACK BY AND VERIFIED  WITH: PHARMD M BELL [id number] AT 1443 BY CM    Staphylococcus lugdunensis NOT DETECTED NOT DETECTED Final   Streptococcus species NOT DETECTED NOT DETECTED Final   Streptococcus agalactiae NOT DETECTED NOT DETECTED Final   Streptococcus pneumoniae NOT DETECTED NOT DETECTED Final   Streptococcus pyogenes NOT DETECTED NOT DETECTED Final   A.calcoaceticus-baumannii NOT DETECTED NOT DETECTED Final   Bacteroides fragilis NOT DETECTED NOT DETECTED Final   Enterobacterales NOT DETECTED NOT DETECTED Final   Enterobacter cloacae complex NOT DETECTED  NOT DETECTED Final   Escherichia coli NOT DETECTED NOT DETECTED Final   Klebsiella aerogenes NOT DETECTED NOT DETECTED Final   Klebsiella oxytoca NOT DETECTED NOT DETECTED Final   Klebsiella pneumoniae NOT DETECTED NOT DETECTED Final   Proteus species NOT DETECTED NOT DETECTED Final   Salmonella species NOT DETECTED NOT DETECTED Final   Serratia marcescens NOT DETECTED NOT DETECTED Final   Haemophilus influenzae NOT DETECTED NOT DETECTED Final   Neisseria meningitidis NOT DETECTED NOT DETECTED Final   Pseudomonas aeruginosa NOT DETECTED NOT DETECTED Final   Stenotrophomonas maltophilia NOT DETECTED NOT DETECTED Final   Candida albicans NOT DETECTED NOT DETECTED Final   Candida auris NOT DETECTED NOT DETECTED Final   Candida glabrata NOT DETECTED NOT DETECTED Final   Candida krusei NOT DETECTED NOT DETECTED Final   Candida parapsilosis NOT DETECTED NOT DETECTED Final   Candida tropicalis NOT DETECTED NOT DETECTED Final   Cryptococcus neoformans/gattii NOT DETECTED NOT DETECTED Final   Methicillin resistance mecA/C DETECTED (A) NOT DETECTED Final    Comment: CRITICAL RESULT CALLED TO, READ BACK BY AND VERIFIED WITH: Sanda Klein [phone] AT 1443 BY CM Performed at [facility], [address], [place], [place] [address]   Blood culture (routine x 2)     Status: None (Preliminary result)   Collection Time: 09/27/22  3:05 PM   Specimen: BLOOD  RIGHT FOREARM  Result Value Ref Range Status   Specimen Description   Final    BLOOD RIGHT FOREARM Performed at [facility], [place] [address] [place], [place] [address]    Special Requests   Final    BOTTLES DRAWN AEROBIC AND ANAEROBIC Blood Culture adequate volume Performed at [place] [address], [place], [place] [address]    Culture   Final    NO GROWTH 3 DAYS Performed at [facility], [place] [address] [place] [place], [place] [address]    Report Status PENDING  Incomplete  Urine Culture     Status: None   Collection Time: 09/27/22  5:03 PM   Specimen: Urine, Clean Catch  Result Value Ref Range Status   Specimen Description   Final    URINE, CLEAN CATCH Performed at [facility], [place] [address] [place], [place] [address]    Special Requests   Final    NONE Performed at [facility], [place] [address], [place], [place] [address]    Culture   Final    NO GROWTH Performed at [facility], [place] [address] [place],  [address]    Report Status 09/28/2022 FINAL  Final     Labs: BNP (last 3 results) No results for input(s): "BNP" in the last 8760 hours. Basic Metabolic Panel: Recent Labs  Lab 09/27/22 1309  NA 144  K 4.4  CL 110  CO2 26  GLUCOSE 113*  BUN 23  CREATININE 0.95  CALCIUM 9.6   Liver Function Tests: Recent Labs  Lab 09/27/22 1309  AST 19  ALT 14  ALKPHOS 67  BILITOT 0.6  PROT 6.7  ALBUMIN 3.3*   Recent Labs  Lab 09/27/22 1309  LIPASE 36   No results for input(s): "AMMONIA" in the last 168 hours. CBC: Recent Labs  Lab 09/27/22 1309  WBC 9.7  NEUTROABS 7.2  HGB 9.9*  HCT 33.3*  MCV 81.6  PLT 280   Cardiac Enzymes: No results for input(s): "CKTOTAL", "CKMB", "CKMBINDEX", "TROPONINI" in the last 168 hours.  BNP: Invalid input(s): "POCBNP" CBG: Recent Labs  Lab 09/28/22 0746 09/30/22 0800  GLUCAP 100* 101*   D-Dimer No results for input(s):  "DDIMER" in the last 72 hours. Hgb A1c No results for input(s): "HGBA1C" in the last 72 hours. Lipid Profile No results for input(s): "CHOL", "HDL", "LDLCALC", "TRIG", "CHOLHDL", "LDLDIRECT" in the last 72 hours. Thyroid function studies Recent Labs    09/27/22 1437  TSH 0.650   Anemia work up No results for input(s): "VITAMINB12", "FOLATE", "FERRITIN", "TIBC", "IRON", "RETICCTPCT" in the last 72 hours. Urinalysis    Component Value Date/Time   COLORURINE AMBER (A) 09/27/2022 1703   APPEARANCEUR HAZY (A) 09/27/2022 1703   APPEARANCEUR Cloudy (A) 09/22/2014 1015   LABSPEC 1.018 09/27/2022 1703   PHURINE 6.0 09/27/2022 1703   GLUCOSEU NEGATIVE 09/27/2022 1703   HGBUR NEGATIVE 09/27/2022 1703   BILIRUBINUR NEGATIVE 09/27/2022 1703   BILIRUBINUR negative 01/19/2022 1438   BILIRUBINUR Negative 09/22/2014 1015   KETONESUR 5 (A) 09/27/2022 1703   PROTEINUR NEGATIVE 09/27/2022 1703   UROBILINOGEN negative (A) 01/19/2022 1438   UROBILINOGEN 0.2 03/11/2013 2003   NITRITE NEGATIVE 09/27/2022 1703   LEUKOCYTESUR NEGATIVE 09/27/2022 1703   Sepsis Labs Recent Labs  Lab 09/27/22 1309  WBC 9.7   Microbiology Recent Results (from the past 240 hour(s))  Resp Panel by RT-PCR (Flu A&B, Covid) Anterior Nasal Swab     Status: None   Collection Time: 09/27/22  2:52 PM   Specimen: Anterior Nasal Swab  Result Value Ref Range Status   SARS Coronavirus 2 by RT PCR NEGATIVE NEGATIVE Final    Comment: (NOTE) SARS-CoV-2 target nucleic acids are NOT DETECTED.  The SARS-CoV-2 RNA is generally detectable in upper respiratory specimens during the acute phase of infection. The lowest concentration of SARS-CoV-2 viral copies this assay can detect is 138 copies/mL. A negative result does not preclude SARS-Cov-2 infection and should not be used as the sole basis for treatment or other patient management decisions. A negative result may occur with  improper specimen collection/handling, submission of  specimen other than nasopharyngeal swab, presence of viral mutation(s) within the areas targeted by this assay, and inadequate number of viral copies(<138 copies/mL). A negative result must be combined with clinical observations, patient history, and epidemiological information. The expected result is Negative.  Fact Sheet for Patients:  EntrepreneurPulse.com.au  Fact Sheet for Healthcare Providers:  IncredibleEmployment.be  This test is no t yet approved or cleared by the [place] FDA and  has been authorized for detection and/or diagnosis of SARS-CoV-2 by FDA under an Emergency Use Authorization (EUA). This EUA will remain  in effect (meaning this test can be used) for the duration of the COVID-19 declaration under Section 564(b)(1) of the Act, 21 U.S.C.section 360bbb-3(b)(1), unless the authorization is terminated  or revoked sooner.       Influenza A by PCR NEGATIVE NEGATIVE Final   Influenza B by PCR NEGATIVE NEGATIVE Final    Comment: (NOTE) The Xpert Xpress SARS-CoV-2/FLU/RSV plus assay is intended as an aid in the diagnosis of influenza from Nasopharyngeal swab specimens and should not be used as a sole basis for treatment. Nasal washings and aspirates are unacceptable for Xpert Xpress SARS-CoV-2/FLU/RSV testing.  Fact Sheet for Patients: EntrepreneurPulse.com.au  Fact Sheet for Healthcare Providers: IncredibleEmployment.be  This test is not yet approved or cleared by the [place] FDA and has been authorized for detection and/or diagnosis of SARS-CoV-2 by FDA under an Emergency Use Authorization (EUA). This EUA will remain in  effect (meaning this test can be used) for the duration of the COVID-19 declaration under Section 564(b)(1) of the Act, 21 U.S.C. section 360bbb-3(b)(1), unless the authorization is terminated or revoked.  Performed at [facility], [place]  [address], [place], [place] [address]   Blood culture (routine x 2)     Status: Abnormal (Preliminary result)   Collection Time: 09/27/22  2:56 PM   Specimen: BLOOD LEFT FOREARM  Result Value Ref Range Status   Specimen Description   Final    BLOOD LEFT FOREARM Performed at [facility], [place] [address] [place], [place] [address]    Special Requests   Final    BOTTLES DRAWN AEROBIC AND ANAEROBIC Blood Culture results may not be optimal due to an excessive volume of blood received in culture bottles Performed at [place] [address], [place], [place] [address]    Culture  Setup Time   Final    GRAM POSITIVE COCCI IN CLUSTERS ANAEROBIC BOTTLE ONLY CRITICAL RESULT CALLED TO, READ BACK BY AND VERIFIED WITH: PHARMD M BELL [id number] AT 55 BY CM    Culture (A)  Final    STAPHYLOCOCCUS EPIDERMIDIS THE SIGNIFICANCE OF ISOLATING THIS ORGANISM FROM A SINGLE SET OF BLOOD CULTURES WHEN MULTIPLE SETS ARE DRAWN IS UNCERTAIN. PLEASE NOTIFY THE MICROBIOLOGY DEPARTMENT WITHIN ONE WEEK IF SPECIATION AND SENSITIVITIES ARE REQUIRED. Performed at [facility], [place] [address], [place], [place] [address]    Report Status PENDING  Incomplete  Blood Culture ID Panel (Reflexed)     Status: Abnormal   Collection Time: 09/27/22  2:56 PM  Result Value Ref Range Status   Enterococcus faecalis NOT DETECTED NOT DETECTED Final   Enterococcus Faecium NOT DETECTED NOT DETECTED Final   Listeria monocytogenes NOT DETECTED NOT DETECTED Final   Staphylococcus species DETECTED (A) NOT DETECTED Final    Comment: CRITICAL RESULT CALLED TO, READ BACK BY AND VERIFIED WITH: PHARMD M BELL [id number] AT 1442 BY CM    Staphylococcus aureus (BCID) NOT DETECTED NOT DETECTED Final   Staphylococcus epidermidis DETECTED (A) NOT DETECTED Final    Comment: Methicillin (oxacillin) resistant coagulase negative staphylococcus. Possible blood culture contaminant (unless isolated from more than one blood  culture draw or clinical case suggests pathogenicity). No antibiotic treatment is indicated for blood  culture contaminants. CRITICAL RESULT CALLED TO, READ BACK BY AND VERIFIED WITH: PHARMD M BELL [id number] AT 1443 BY CM    Staphylococcus lugdunensis NOT DETECTED NOT DETECTED Final   Streptococcus species NOT DETECTED NOT DETECTED Final   Streptococcus agalactiae NOT DETECTED NOT DETECTED Final   Streptococcus pneumoniae NOT DETECTED NOT DETECTED Final   Streptococcus pyogenes NOT DETECTED NOT DETECTED Final   A.calcoaceticus-baumannii NOT DETECTED NOT DETECTED Final   Bacteroides fragilis NOT DETECTED NOT DETECTED Final   Enterobacterales NOT DETECTED NOT DETECTED Final   Enterobacter cloacae complex NOT DETECTED NOT DETECTED Final   Escherichia coli NOT DETECTED NOT DETECTED Final   Klebsiella aerogenes NOT DETECTED NOT DETECTED Final   Klebsiella oxytoca NOT DETECTED NOT DETECTED Final   Klebsiella pneumoniae NOT DETECTED NOT DETECTED Final   Proteus species NOT DETECTED NOT DETECTED Final   Salmonella species NOT DETECTED NOT DETECTED Final   Serratia marcescens NOT DETECTED NOT DETECTED Final   Haemophilus influenzae NOT DETECTED NOT DETECTED Final   Neisseria meningitidis NOT DETECTED NOT DETECTED Final   Pseudomonas aeruginosa NOT DETECTED NOT DETECTED Final   Stenotrophomonas maltophilia NOT DETECTED NOT DETECTED  Final   Candida albicans NOT DETECTED NOT DETECTED Final   Candida auris NOT DETECTED NOT DETECTED Final   Candida glabrata NOT DETECTED NOT DETECTED Final   Candida krusei NOT DETECTED NOT DETECTED Final   Candida parapsilosis NOT DETECTED NOT DETECTED Final   Candida tropicalis NOT DETECTED NOT DETECTED Final   Cryptococcus neoformans/gattii NOT DETECTED NOT DETECTED Final   Methicillin resistance mecA/C DETECTED (A) NOT DETECTED Final    Comment: CRITICAL RESULT CALLED TO, READ BACK BY AND VERIFIED WITH: Sanda Klein [id number] AT 1443 BY CM Performed at [facility] [address] [place], [place] [address]   Blood culture (routine x 2)     Status: None (Preliminary result)   Collection Time: 09/27/22  3:05 PM   Specimen: BLOOD RIGHT FOREARM  Result Value Ref Range Status   Specimen Description   Final    BLOOD RIGHT FOREARM Performed at [facility], [place] [address], [place], [place] [address]    Special Requests   Final    BOTTLES DRAWN AEROBIC AND ANAEROBIC Blood Culture adequate volume Performed at [facility] [address] [place], [place] [address]    Culture   Final    NO GROWTH 3 DAYS Performed at [facility], [place] [address] [place], [place] [address]    Report Status PENDING  Incomplete  Urine Culture     Status: None   Collection Time: 09/27/22  5:03 PM   Specimen: Urine, Clean Catch  Result Value Ref Range Status   Specimen Description   Final    URINE, CLEAN CATCH Performed at [facility], [place] [address], [place], [place] [address]    Special Requests   Final    NONE Performed at [facility], [place] [address] [place], [place] [address]    Culture   Final    NO GROWTH Performed at [facility], [place] [address] [place], [place] [address]    Report Status 09/28/2022 FINAL  Final     Time coordinating discharge: 32 minutes  SIGNED:   Barb Merino, MD  [facility] 09/30/2022, 8:55 AM

## 2022-09-30 NOTE — TOC Transition Note (Signed)
Transition of Care [facility]) - CM/SW Discharge Note   Patient Details  Name: Andrea [name] MRN: [id number] Date of Birth: 10/28/1935  [facility] CM/SW Contact:  Vassie Moselle, LCSW Phone Number: 09/30/2022, 11:49 AM   Clinical Narrative:    Met with pt and daughter and confirmed plan for home health. HHPT has been arranged with [facility].    Final next level of care: [place] Barriers to Discharge: No Barriers Identified   Patient Goals and CMS Choice Patient states their goals for this hospitalization and ongoing recovery are:: To return home CMS Medicare.gov Compare Post Acute Care list provided to:: Patient Choice offered to / list presented to : Adult Children, Patient  Discharge Placement                       Discharge Plan and Services                DME Arranged: N/A DME Agency: NA       HH Arranged: PT HH Agency: [place] Date [place]: 09/30/22 Time [place]: 0938 Representative spoke with at [place]: [place] ([place]) Interventions     Readmission Risk Interventions    09/30/2022   11:48 AM  Readmission Risk Prevention Plan  Transportation Screening Complete  PCP or Specialist Appt within 5-7 Days Complete  Home Care Screening Complete  Medication Review (RN CM) Complete

## 2022-10-02 ENCOUNTER — Telehealth: Payer: Self-pay

## 2022-10-02 LAB — CULTURE, BLOOD (ROUTINE X 2)
Culture: NO GROWTH
Special Requests: ADEQUATE

## 2022-10-02 NOTE — Patient Outreach (Signed)
  Care Coordination [facility] Note Transition Care Management Follow-up Telephone Call Date of discharge and from where: 09/30/22-[facility] Dx:PNA How have you been since you were released from the hospital? Call completed with caregiver/daughter-Bobbi. She vices that patient is "doing pretty good" and "back to her baseline." Any questions or concerns? No  Items Reviewed: Did the pt receive and understand the discharge instructions provided? Yes  Medications obtained and verified? Yes  Other? Yes  Any new allergies since your discharge? No  Dietary orders reviewed? Yes Do you have support at home? Yes   Home Care and Equipment/Supplies: Were home health services ordered? yes If so, what is the name of the agency? [facility]  Has the agency set up a time to come to the patient's home? No-daughter provided with agency contact info and aware to call if she does not hear from them by the end of today Were any new equipment or medical supplies ordered?  No What is the name of the medical supply agency? N/A Were you able to get the supplies/equipment? not applicable Do you have any questions related to the use of the equipment or supplies? No  Functional Questionnaire: (I = Independent and D = Dependent) ADLs: A  Bathing/Dressing- A  Meal Prep- A  Eating- I  Maintaining continence- A  Transferring/Ambulation- A  Managing Meds- A  Follow up appointments reviewed:  PCP Hospital f/u appt confirmed? No  Daughter will call the office today to make an appt. [facility] f/u appt confirmed? Yes  Scheduled to see Vic Ripper on 10/19/22 @ 3:30pm. Are transportation arrangements needed? No  If their condition worsens, is the pt aware to call PCP or go to the Emergency Dept.? Yes Was the patient provided with contact information for the PCP's office or ED? Yes Was to pt encouraged to call back with questions or concerns? Yes  SDOH assessments and interventions completed:    Yes  Care Coordination Interventions Activated:  Yes   Care Coordination Interventions:  Education provided    Encounter Outcome:  Pt. Visit Completed    Enzo Montgomery, RN,BSN,CCM [place] Management Telephonic Care Management Coordinator Direct Phone: [phone] Toll Free: [phone] Fax: [phone]

## 2022-10-03 DIAGNOSIS — G319 Degenerative disease of nervous system, unspecified: Secondary | ICD-10-CM | POA: Diagnosis not present

## 2022-10-03 DIAGNOSIS — Z87891 Personal history of nicotine dependence: Secondary | ICD-10-CM | POA: Diagnosis not present

## 2022-10-03 DIAGNOSIS — M19041 Primary osteoarthritis, right hand: Secondary | ICD-10-CM | POA: Diagnosis not present

## 2022-10-03 DIAGNOSIS — Z7901 Long term (current) use of anticoagulants: Secondary | ICD-10-CM | POA: Diagnosis not present

## 2022-10-03 DIAGNOSIS — F0283 Dementia in other diseases classified elsewhere, unspecified severity, with mood disturbance: Secondary | ICD-10-CM | POA: Diagnosis not present

## 2022-10-03 DIAGNOSIS — J181 Lobar pneumonia, unspecified organism: Secondary | ICD-10-CM | POA: Diagnosis not present

## 2022-10-03 DIAGNOSIS — E785 Hyperlipidemia, unspecified: Secondary | ICD-10-CM | POA: Diagnosis not present

## 2022-10-03 DIAGNOSIS — E039 Hypothyroidism, unspecified: Secondary | ICD-10-CM | POA: Diagnosis not present

## 2022-10-03 DIAGNOSIS — E1169 Type 2 diabetes mellitus with other specified complication: Secondary | ICD-10-CM | POA: Diagnosis not present

## 2022-10-03 DIAGNOSIS — F0284 Dementia in other diseases classified elsewhere, unspecified severity, with anxiety: Secondary | ICD-10-CM | POA: Diagnosis not present

## 2022-10-03 DIAGNOSIS — M19042 Primary osteoarthritis, left hand: Secondary | ICD-10-CM | POA: Diagnosis not present

## 2022-10-03 DIAGNOSIS — Z86718 Personal history of other venous thrombosis and embolism: Secondary | ICD-10-CM | POA: Diagnosis not present

## 2022-10-03 DIAGNOSIS — F05 Delirium due to known physiological condition: Secondary | ICD-10-CM | POA: Diagnosis not present

## 2022-10-03 DIAGNOSIS — G9341 Metabolic encephalopathy: Secondary | ICD-10-CM | POA: Diagnosis not present

## 2022-10-03 DIAGNOSIS — I480 Paroxysmal atrial fibrillation: Secondary | ICD-10-CM | POA: Diagnosis not present

## 2022-10-03 DIAGNOSIS — F418 Other specified anxiety disorders: Secondary | ICD-10-CM | POA: Diagnosis not present

## 2022-10-03 DIAGNOSIS — Z853 Personal history of malignant neoplasm of breast: Secondary | ICD-10-CM | POA: Diagnosis not present

## 2022-10-03 DIAGNOSIS — M479 Spondylosis, unspecified: Secondary | ICD-10-CM | POA: Diagnosis not present

## 2022-10-03 DIAGNOSIS — Z981 Arthrodesis status: Secondary | ICD-10-CM | POA: Diagnosis not present

## 2022-10-03 DIAGNOSIS — G4733 Obstructive sleep apnea (adult) (pediatric): Secondary | ICD-10-CM | POA: Diagnosis not present

## 2022-10-03 DIAGNOSIS — Z9012 Acquired absence of left breast and nipple: Secondary | ICD-10-CM | POA: Diagnosis not present

## 2022-10-03 DIAGNOSIS — M5416 Radiculopathy, lumbar region: Secondary | ICD-10-CM | POA: Diagnosis not present

## 2022-10-03 DIAGNOSIS — D649 Anemia, unspecified: Secondary | ICD-10-CM | POA: Diagnosis not present

## 2022-10-03 DIAGNOSIS — Z96653 Presence of artificial knee joint, bilateral: Secondary | ICD-10-CM | POA: Diagnosis not present

## 2022-10-03 DIAGNOSIS — F02818 Dementia in other diseases classified elsewhere, unspecified severity, with other behavioral disturbance: Secondary | ICD-10-CM | POA: Diagnosis not present

## 2022-10-05 DIAGNOSIS — J181 Lobar pneumonia, unspecified organism: Secondary | ICD-10-CM | POA: Diagnosis not present

## 2022-10-05 DIAGNOSIS — G319 Degenerative disease of nervous system, unspecified: Secondary | ICD-10-CM | POA: Diagnosis not present

## 2022-10-05 DIAGNOSIS — F05 Delirium due to known physiological condition: Secondary | ICD-10-CM | POA: Diagnosis not present

## 2022-10-05 DIAGNOSIS — F0284 Dementia in other diseases classified elsewhere, unspecified severity, with anxiety: Secondary | ICD-10-CM | POA: Diagnosis not present

## 2022-10-05 DIAGNOSIS — F02818 Dementia in other diseases classified elsewhere, unspecified severity, with other behavioral disturbance: Secondary | ICD-10-CM | POA: Diagnosis not present

## 2022-10-05 DIAGNOSIS — F0283 Dementia in other diseases classified elsewhere, unspecified severity, with mood disturbance: Secondary | ICD-10-CM | POA: Diagnosis not present

## 2022-10-06 ENCOUNTER — Ambulatory Visit (INDEPENDENT_AMBULATORY_CARE_PROVIDER_SITE_OTHER): Payer: Medicare Other | Admitting: Family

## 2022-10-06 ENCOUNTER — Telehealth: Payer: Self-pay

## 2022-10-06 ENCOUNTER — Encounter: Payer: Self-pay | Admitting: Family

## 2022-10-06 VITALS — BP 98/60 | HR 71 | Temp 98.2°F | Resp 17 | Ht 62.0 in | Wt 176.8 lb

## 2022-10-06 DIAGNOSIS — E039 Hypothyroidism, unspecified: Secondary | ICD-10-CM | POA: Diagnosis not present

## 2022-10-06 DIAGNOSIS — G8929 Other chronic pain: Secondary | ICD-10-CM

## 2022-10-06 DIAGNOSIS — F341 Dysthymic disorder: Secondary | ICD-10-CM | POA: Diagnosis not present

## 2022-10-06 DIAGNOSIS — M25561 Pain in right knee: Secondary | ICD-10-CM | POA: Diagnosis not present

## 2022-10-06 DIAGNOSIS — E1169 Type 2 diabetes mellitus with other specified complication: Secondary | ICD-10-CM

## 2022-10-06 DIAGNOSIS — I480 Paroxysmal atrial fibrillation: Secondary | ICD-10-CM

## 2022-10-06 DIAGNOSIS — J180 Bronchopneumonia, unspecified organism: Secondary | ICD-10-CM | POA: Diagnosis not present

## 2022-10-06 MED ORDER — TRAMADOL HCL 50 MG PO TABS: 50.0000 mg | ORAL_TABLET | Freq: Four times a day (QID) | ORAL | 3 refills | Status: AC | PRN

## 2022-10-06 NOTE — Telephone Encounter (Signed)
Oxycodone was discontinued to drowsiness and confusion at the hospital.

## 2022-10-06 NOTE — Progress Notes (Signed)
Provider: Marlowe Sax FNP-C  Hobart Marte, Nelda Bucks, NP  Patient Care Team: Bianna Haran, Nelda Bucks, NP as PCP - General (Family Medicine) Tanda Rockers, MD as Consulting Physician (Pulmonary Disease) Deveron Furlong, NP as Nurse Practitioner (Psychiatry)  Extended Emergency Contact Information Primary Emergency Contact: Lofstrom,Roberta(Bobbi) Address: [address]          [place], [place] [address] [name] Phone: [phone] Mobile Phone: [phone] Relation: Daughter Secondary Emergency Contact: Gaynor,David Address: [address]           [place], [place] [address] [place] Phone: [phone] Work Phone: [phone] Relation: Son  Code Status:  DNR Goals of care: Advanced Directive information    10/06/2022   11:06 AM  Advanced Directives  Does Patient Have a Medical Advance Directive? Yes  Type of Advance Directive Out of facility DNR (pink MOST or yellow form)  Does patient want to make changes to medical advance directive? No - Patient declined     Chief Complaint  Patient presents with   Transitions Of Care    TOC 09/27/2022-09/30/2022.    HPI:  Pt is a 87 y.o. female seen today for an acute visit for transition of care posthospitalization from 09/27/2022 to 09/30/2022 for left-sided pneumonia symptomatically treated with IV fluids and antibiotics with improvement.lab worker done at the hospital were unremarkable except Hgb dropped from 11.0 to 9.6.COVID-19 and Influenza swab were negative.urine analysis and culture was negative for UTI.Her Nitrofurantoin prophylaxis was discontinued.  She denies any fever,chills,cough,fatigue,body aches,runny nose,chest tightness,chest pain,palpitation or shortness of breath.Doing better now since she was discharged home. Has completed 4 days of Augmentin discharged with from the hospital.  Care giver here with patient states patient has been walking with Physical therapy from here bedroom to  the end of the hallway.states patient complains of pain from previous fall episode on her sacral area.X-ray done during hospitalization was negative for fracture.Care giver states daughter requested some few pain killers to give patient whenever she has pain.Her previous oxycodone was discontinued.  Medication reconciled.   Past Medical History:  Diagnosis Date   A-fib ([place])    Anemia    Anxiety    Arthritis    hands, spine    Bladder incontinence    Blood transfusion    Cancer ([place]) 02/2019   left breast cancer   Cervicalgia    Constipation    Cough    [place]-onset 08/2009, as of 2014- resolved    Deep venous thrombosis ([place])    post op, rec'd/needed  blood thinner    Depression    Depression with anxiety    Dysrhythmia    a-fib   Falls frequently    pt. reports that she was falling 3 times a day, has had PT at a facility for a while & now is getting home PT 2-3 times/ week    Headache(784.0) 09/27/2011   History of stress test    15-20 yrs.ago    Hypotension    Hypothyroidism    Lumbar radiculopathy 09/26/2011   Pneumonia    never been in hosp. for pneumonia    Prediabetes    Right hand fracture    Monday   Shortness of breath    Sleep apnea    does not use consistently   Past Surgical History:  Procedure Laterality Date   ANTERIOR CERVICAL DECOMP/DISCECTOMY FUSION N/A 07/09/2013   Procedure: ANTERIOR CERVICAL DECOMPRESSION/DISCECTOMY FUSION 3 LEVELS;  Surgeon: Sinclair Ship, MD;  Location: [facility];  Service: Orthopedics;  Laterality: N/A;  Anterior cervical decompression fusion, cervical 3-4, cervical 4-5-, cervical 5-6 with instrumentation, allograft.   APPENDECTOMY     BACK SURGERY     2000   BREAST EXCISIONAL BIOPSY     breast bxs on left x 2, breast bx of right x 1-all benign   EYE SURGERY     cataracts bilateral /w IOL   HERNIA REPAIR     umbilical hernia- 6789   IR IMAGING GUIDED PORT INSERTION  04/28/2019   JOINT REPLACEMENT  2006,2007   bilateral    LAPAROSCOPIC OVARIAN CYSTECTOMY     not done by laparscopy-abdominal incision   LEG SURGERY     ORIF PERIPROSTHETIC FRACTURE Right 07/26/2019   Procedure: OPEN REDUCTION INTERNAL FIXATION (ORIF) RIGHT FEMUR PERIPROSTHETIC FRACTURE;  Surgeon: Rod Can, MD;  Location: [facility];  Service: Orthopedics;  Laterality: Right;   PORTACATH PLACEMENT Right 04/01/2019   Procedure: ATTEMPTED INSERTION PORT-A-CATH WITH ULTRASOUND;  Surgeon: [name], MD;  Location: [facility];  Service: General;  Laterality: Right;   RADIOACTIVE SEED GUIDED PARTIAL MASTECTOMY WITH AXILLARY SENTINEL LYMPH NODE BIOPSY Left 04/01/2019   Procedure: LEFT BREAST PARTIAL MASTECTOMY WITH RADIOACTIVE SEED AND LEFT SENTINEL LYMPH NODE BIOPSY;  Surgeon: [name], MD;  Location: [facility];  Service: General;  Laterality: Left;   RE-EXCISION OF BREAST CANCER,SUPERIOR MARGINS Left 04/15/2019   Procedure: RE-EXCISION OF LEFT BREAST CANCER POSITIVE MARGINS;  Surgeon: [name], MD;  Location: [facility];  Service: General;  Laterality: Left;   REPLACEMENT TOTAL KNEE BILATERAL     TONSILLECTOMY      Allergies  Allergen Reactions   Clindamycin Hcl Other (See Comments)    REACTION: swelling, pt. Reports that she had a 16 lb. weightgain in one day    Outpatient Encounter Medications as of 10/06/2022  Medication Sig   albuterol (VENTOLIN HFA) 108 (90 Base) MCG/ACT inhaler Inhale 2 puffs into the lungs every 6 (six) hours as needed for wheezing or shortness of breath.   brexpiprazole (REXULTI) 2 MG TABS tablet Take 2 mg by mouth at bedtime.   busPIRone (BUSPAR) 5 MG tablet Take 5-10 mg by mouth 2 (two) times daily. 10 MG IN THE MORNING, 5 MG AT DINNER   CALCIUM CITRATE PO Take 600 mg by mouth daily.    clonazePAM (KLONOPIN) 0.5 MG tablet Take 0.25 mg by mouth as needed for anxiety.   D-Mannose 500 MG CAPS Take by mouth daily.   donepezil (ARICEPT) 10 MG tablet Take 1  tablet (10 mg total) by mouth at bedtime. Take 1  tablet at 10 mg daily   DULoxetine (CYMBALTA) 30 MG capsule Take 90 mg by mouth daily.   ELIQUIS 5 MG TABS tablet TAKE 1 TABLET BY MOUTH TWICE A DAY   furosemide (LASIX) 20 MG tablet TAKE 1 TABLET BY MOUTH EVERY DAY   KLOR-CON M20 20 MEQ tablet TAKE 1 TABLET BY MOUTH EVERY DAY   levothyroxine (SYNTHROID) 50 MCG tablet TAKE 1 TABLET BY MOUTH DAILY BEFORE BREAKFAST. DX E03.9   mirtazapine (REMERON) 15 MG tablet Take 7.5 mg by mouth at bedtime.   Multiple Vitamins-Minerals (MULTIVITAMIN PO) Take 1 tablet by mouth daily.    oxybutynin (DITROPAN-XL) 10 MG 24 hr tablet Take 10 mg by mouth daily.   Probiotic Product (PROBIOTIC DAILY PO) Take 1 tablet by mouth daily. OTC   senna-docusate (SENOKOT-S) 8.6-50 MG tablet Take 1 tablet by mouth 2 (  two) times daily as needed for mild constipation.   traZODone (DESYREL) 100 MG tablet Take 100 mg by mouth at bedtime.   trimethoprim (TRIMPEX) 100 MG tablet Take 100 mg by mouth at bedtime.   vitamin B-12 (CYANOCOBALAMIN) 500 MCG tablet Take 500 mcg by mouth daily.   Vitamin D, Ergocalciferol, (DRISDOL) 1.25 MG (50000 UNIT) CAPS capsule Take 50,000 Units by mouth once a week.   [DISCONTINUED] clonazePAM (KLONOPIN) 0.5 MG tablet Take 1 tablet by mouth as needed in middle of the nite to go back to sleep (Patient taking differently: Take 0.25 mg by mouth See admin instructions. Take half tablet or whole tablet (0.74m - 0.570m by mouth as needed in middle of the nite to go back to sleep)   [DISCONTINUED] CRANBERRY PO Take 2 tablets by mouth daily.   No facility-administered encounter medications on file as of 10/06/2022.    Review of Systems  Unable to perform ROS: Dementia (additional HPI provided by Care giver)  Constitutional:  Negative for appetite change, chills, fatigue, fever and unexpected weight change.  HENT:  Positive for hearing loss. Negative for congestion, dental problem, ear discharge, ear pain,  facial swelling, nosebleeds, postnasal drip, rhinorrhea, sinus pressure, sinus pain, sneezing, sore throat, tinnitus and trouble swallowing.   Eyes:  Negative for pain, discharge, redness, itching and visual disturbance.  Respiratory:  Negative for cough, chest tightness, shortness of breath and wheezing.   Cardiovascular:  Negative for chest pain, palpitations and leg swelling.  Gastrointestinal:  Negative for abdominal distention, abdominal pain, blood in stool, constipation, diarrhea, nausea and vomiting.  Endocrine: Negative for cold intolerance, heat intolerance, polydipsia, polyphagia and polyuria.  Genitourinary:  Negative for difficulty urinating, dysuria, flank pain, frequency and urgency.       Incontinent   Musculoskeletal:  Positive for arthralgias and gait problem. Negative for back pain, joint swelling, myalgias, neck pain and neck stiffness.  Skin:  Negative for color change, pallor, rash and wound.  Neurological:  Negative for dizziness, syncope, speech difficulty, weakness, light-headedness, numbness and headaches.  Hematological:  Does not bruise/bleed easily.  Psychiatric/Behavioral:  Negative for agitation, behavioral problems, confusion, hallucinations, self-injury, sleep disturbance and suicidal ideas. The patient is not nervous/anxious.     Immunization History  Administered Date(s) Administered   Fluad Quad(high Dose 65+) 07/22/2020, 09/29/2022   Influenza Split 08/14/2013   Influenza Whole 08/20/2009   Influenza, High Dose Seasonal PF 09/11/2017, 07/25/2018, 09/14/2021   Influenza,inj,Quad PF,6+ Mos 08/04/2014, 09/06/2015, 07/10/2016   PFIZER Comirnaty(Gray Top)Covid-19 Tri-Sucrose Vaccine 02/23/2021   PFIZER(Purple Top)SARS-COV-2 Vaccination 12/27/2019, 01/17/2020, 08/30/2020, 09/14/2021   Pneumococcal Conjugate-13 01/14/2015   Pneumococcal Polysaccharide-23 07/11/2013   Td 11/20/2005, 11/27/2016   Pertinent  Health Maintenance Due  Topic Date Due    OPHTHALMOLOGY EXAM  02/16/2021   FOOT EXAM  07/22/2021   HEMOGLOBIN A1C  11/25/2022   INFLUENZA VACCINE  Completed   DEXA SCAN  Completed      09/28/2022    7:50 PM 09/29/2022   11:00 AM 09/29/2022    8:30 PM 09/30/2022    8:00 AM 10/06/2022   11:06 AM  Fall Risk  Falls in the past year?     1  Was there an injury with Fall?     1  Fall Risk Category Calculator     2  Fall Risk Category     Moderate  Patient Fall Risk Level High fall risk High fall risk High fall risk High fall risk Moderate fall risk  Patient at  Risk for Falls Due to     History of fall(s);Impaired balance/gait;Impaired mobility  Fall risk Follow up     Falls evaluation completed;Education provided;Falls prevention discussed   Functional Status Survey:    Vitals:   10/06/22 1053  BP: 98/60  Pulse: 71  Resp: 17  Temp: 98.2 F (36.8 C)  SpO2: 99%  Weight: 176 lb 12.8 oz (80.2 kg)  Height: _0  (1.575 m)   Body mass index is 32.34 kg/m. Physical Exam Vitals reviewed.  Constitutional:      General: She is not in acute distress.    Appearance: Normal appearance. She is obese. She is not ill-appearing or diaphoretic.  HENT:     Head: Normocephalic.     Right Ear: Tympanic membrane, ear canal and external ear normal. There is no impacted cerumen.     Left Ear: Tympanic membrane, ear canal and external ear normal. There is no impacted cerumen.     Ears:     Comments: Bilateral Hearing Aids     Nose: Nose normal. No congestion or rhinorrhea.     Mouth/Throat:     Mouth: Mucous membranes are moist.     Pharynx: Oropharynx is clear. No oropharyngeal exudate or posterior oropharyngeal erythema.  Eyes:     General: No scleral icterus.       Right eye: No discharge.        Left eye: No discharge.     Extraocular Movements: Extraocular movements intact.     Conjunctiva/sclera: Conjunctivae normal.     Pupils: Pupils are equal, round, and reactive to light.  Neck:     Vascular: No carotid bruit.   Cardiovascular:     Rate and Rhythm: Normal rate and regular rhythm.     Pulses: Normal pulses.     Heart sounds: Normal heart sounds. No murmur heard.    No friction rub. No gallop.  Pulmonary:     Effort: Pulmonary effort is normal. No respiratory distress.     Breath sounds: Normal breath sounds. No wheezing, rhonchi or rales.  Chest:     Chest wall: No tenderness.  Abdominal:     General: Bowel sounds are normal. There is no distension.     Palpations: Abdomen is soft. There is no mass.     Tenderness: There is no abdominal tenderness. There is no right CVA tenderness, left CVA tenderness, guarding or rebound.  Musculoskeletal:        General: No swelling.     Cervical back: Normal range of motion. No rigidity or tenderness.     Right knee: Crepitus present. No effusion, erythema or ecchymosis. No tenderness.     Left knee: Crepitus present. No effusion, erythema or ecchymosis. No tenderness.     Right lower leg: No edema.     Left lower leg: No edema.     Comments: Unsteady gait   Lymphadenopathy:     Cervical: No cervical adenopathy.  Skin:    General: Skin is warm and dry.     Coloration: Skin is not pale.     Findings: No bruising, erythema, lesion or rash.  Neurological:     Mental Status: She is alert. Mental status is at baseline.     Cranial Nerves: No cranial nerve deficit.     Sensory: No sensory deficit.     Motor: No weakness.     Coordination: Coordination normal.     Gait: Gait abnormal.  Psychiatric:  Mood and Affect: Mood normal.        Speech: Speech normal.        Behavior: Behavior normal.    Labs reviewed: Recent Labs    02/20/22 1054 05/25/22 0944 09/27/22 1309  NA 143 139 144  K 4.2 4.0 4.4  CL 104 102 110  CO2 _0 GLUCOSE 100 107* 113*  BUN 27* 21 23  CREATININE 0.98* 1.04* 0.95  CALCIUM 9.6 9.4 9.6   Recent Labs    11/25/21 1502 02/20/22 1054 05/25/22 0944 09/27/22 1309  AST _1 ALT _2 ALKPHOS 63   --   --  67  BILITOT 0.3 0.4 0.4 0.6  PROT 6.2 6.8 6.4 6.7  ALBUMIN 3.7  --   --  3.3*   Recent Labs    02/20/22 1054 05/25/22 0944 09/27/22 1309  WBC 7.9 6.2 9.7  NEUTROABS 5,625 4,210 7.2  HGB 11.8 11.2* 9.9*  HCT 37.1 35.7 33.3*  MCV 85.1 85.8 81.6  PLT 279 286 280   Lab Results  Component Value Date   TSH 0.650 09/27/2022   Lab Results  Component Value Date   HGBA1C 5.0 05/25/2022   Lab Results  Component Value Date   CHOL 204 (H) 05/25/2022   HDL 59 05/25/2022   LDLCALC 122 (H) 05/25/2022   TRIG 115 05/25/2022   CHOLHDL 3.5 05/25/2022    Significant Diagnostic Results in last 30 days:  DG Lumbar Spine Complete  Result Date: 09/27/2022 CLINICAL DATA:  Cough.  Fell last week. EXAM: LUMBAR SPINE - COMPLETE 4+ VIEW COMPARISON:  05/31/2013 FINDINGS: Thoracolumbar curvature convex to the left. Straightening of the normal lumbar lordosis. Previous decompression and fusion from L2 to the sacrum. No acute traumatic finding. IMPRESSION: No acute or traumatic finding. Previous decompression and fusion from L2 to the sacrum. Electronically Signed   By: Nelson Chimes M.D.   On: 09/27/2022 14:22   DG Chest 2 View  Result Date: 09/27/2022 CLINICAL DATA:  [place] last week.  Cough.  Weakness. EXAM: CHEST - 2 VIEW COMPARISON:  04/21/2020 FINDINGS: Patient has taken a poor inspiration. Power port on the right with the tip at the SVC RA junction. Hazy and patchy opacity in the left mid lung suspicious for bronchopneumonia. No dense consolidation or lobar collapse. No visible effusion. IMPRESSION: Poor inspiration. Patchy and hazy opacity in the left mid lung suspicious for bronchopneumonia. Electronically Signed   By: Nelson Chimes M.D.   On: 09/27/2022 14:21   CT Head Wo Contrast  Result Date: 09/27/2022 CLINICAL DATA:  Trauma, fall EXAM: CT HEAD WITHOUT CONTRAST TECHNIQUE: Contiguous axial images were obtained from the base of the skull through the vertex without intravenous contrast.  RADIATION DOSE REDUCTION: This exam was performed according to the departmental dose-optimization program which includes automated exposure control, adjustment of the mA and/or kV according to patient size and/or use of iterative reconstruction technique. COMPARISON:  CT done on 07/29/2019, MR done on 11/11/2021 FINDINGS: Brain: No acute intracranial findings are seen. There are no signs of bleeding within the cranium. Cortical sulci are prominent. There is decreased density in periventricular and subcortical white matter. Vascular: Unremarkable. Skull: Unremarkable. Sinuses/Orbits: Unremarkable. Other: No significant interval changes are noted. IMPRESSION: No acute intracranial findings are seen. Atrophy. Small-vessel disease. Electronically Signed   By: Elmer Picker M.D.   On: 09/27/2022 13:37    Assessment/Plan 1. Type 2 diabetes mellitus with other specified complication, without  long-term current use of insulin ([place]) Lab Results  Component Value Date   HGBA1C 5.0 05/25/2022  Well controlled  - dietary controlled  - CBC with Differential/Platelet - BMP with eGFR([facility])  2. Acquired hypothyroidism Lab Results  Component Value Date   TSH 0.650 09/27/2022  Continue on Synthroid 50 mcg tablet daily   3. DEPRESSION/ANXIETY Mood stable  Continue on Duloxetine ,Rexulti and Remeron   4. Paroxysmal A-fib ([facility]) HR controlled  - continue on Eliquis  - CBC with Differential/Platelet  5. Chronic pain of right knee Care request prn analgesic since oxycodone was discontinue  - start on tramadol for breakthrough pain  - continue on tylenol  - traMADol (ULTRAM) 50 MG tablet; Take 1 tablet (50 mg total) by mouth every 6 (six) hours as needed for moderate pain.  Dispense: 30 tablet; Refill: 3  6. Bronchopneumonia Afebrile  Status post hospitalization treated with IVF and antibiotics she was also discharged home with 4 days of Augmentin. CXR showed Patchy and hazy opacity in the left mid  lung suspicious for bronchopneumonia.Symptoms have resolved.feeling much better and back to her baseline.   Family/ staff Communication: Reviewed plan of care with patient and care giver verbalized understanding   Labs/tests ordered:  - CBC with Differential/Platelet - BMP with eGFR([facility])  Next Appointment: Return if symptoms worsen or fail to improve.   Sandrea Hughs, NP

## 2022-10-06 NOTE — Telephone Encounter (Signed)
Patient is calling because she received a new medication today, but for pain but it is not covered by insurance. Patient does not know the name of the medication. I looked at her chart and think she is referring to Tramadol 50 mg Tablets. Her daughter would like to know if she can switch back to her original medication to not have any concerns for picking the prescription up.

## 2022-10-07 LAB — CBC WITH DIFFERENTIAL/PLATELET
Absolute Monocytes: 390 cells/uL (ref 200–950)
Basophils Absolute: 52 cells/uL (ref 0–200)
Basophils Relative: 0.8 %
Eosinophils Absolute: 150 cells/uL (ref 15–500)
Eosinophils Relative: 2.3 %
HCT: 31.3 % — ABNORMAL LOW (ref 35.0–45.0)
Hemoglobin: 9.7 g/dL — ABNORMAL LOW (ref 11.7–15.5)
Lymphs Abs: 1092 cells/uL (ref 850–3900)
MCH: 24.6 pg — ABNORMAL LOW (ref 27.0–33.0)
MCHC: 31 g/dL — ABNORMAL LOW (ref 32.0–36.0)
MCV: 79.2 fL — ABNORMAL LOW (ref 80.0–100.0)
MPV: 10 fL (ref 7.5–12.5)
Monocytes Relative: 6 %
Neutro Abs: 4817 cells/uL (ref 1500–7800)
Neutrophils Relative %: 74.1 %
Platelets: 337 10*3/uL (ref 140–400)
RBC: 3.95 10*6/uL (ref 3.80–5.10)
RDW: 13.7 % (ref 11.0–15.0)
Total Lymphocyte: 16.8 %
WBC: 6.5 10*3/uL (ref 3.8–10.8)

## 2022-10-07 LAB — BASIC METABOLIC PANEL WITH GFR
BUN: 22 mg/dL (ref 7–25)
CO2: 29 mmol/L (ref 20–32)
Calcium: 8.9 mg/dL (ref 8.6–10.4)
Chloride: 105 mmol/L (ref 98–110)
Creat: 0.95 mg/dL (ref 0.60–0.95)
Glucose, Bld: 105 mg/dL — ABNORMAL HIGH (ref 65–99)
Potassium: 4.5 mmol/L (ref 3.5–5.3)
Sodium: 141 mmol/L (ref 135–146)
eGFR: 59 mL/min/{1.73_m2} — ABNORMAL LOW (ref >=60)

## 2022-10-09 NOTE — Telephone Encounter (Signed)
Please obtain a prior authorization for Tramadol since Oxycodone was discontinued at the hospital due to increased drowsiness.

## 2022-10-10 DIAGNOSIS — F0284 Dementia in other diseases classified elsewhere, unspecified severity, with anxiety: Secondary | ICD-10-CM | POA: Diagnosis not present

## 2022-10-10 DIAGNOSIS — F05 Delirium due to known physiological condition: Secondary | ICD-10-CM | POA: Diagnosis not present

## 2022-10-10 DIAGNOSIS — F02818 Dementia in other diseases classified elsewhere, unspecified severity, with other behavioral disturbance: Secondary | ICD-10-CM | POA: Diagnosis not present

## 2022-10-10 DIAGNOSIS — G319 Degenerative disease of nervous system, unspecified: Secondary | ICD-10-CM | POA: Diagnosis not present

## 2022-10-10 DIAGNOSIS — J181 Lobar pneumonia, unspecified organism: Secondary | ICD-10-CM | POA: Diagnosis not present

## 2022-10-10 DIAGNOSIS — F0283 Dementia in other diseases classified elsewhere, unspecified severity, with mood disturbance: Secondary | ICD-10-CM | POA: Diagnosis not present

## 2022-10-11 NOTE — Telephone Encounter (Signed)
Did you start a PA for this patient? If not I am more than happy to start one.

## 2022-10-12 DIAGNOSIS — F02818 Dementia in other diseases classified elsewhere, unspecified severity, with other behavioral disturbance: Secondary | ICD-10-CM | POA: Diagnosis not present

## 2022-10-12 DIAGNOSIS — F0284 Dementia in other diseases classified elsewhere, unspecified severity, with anxiety: Secondary | ICD-10-CM | POA: Diagnosis not present

## 2022-10-12 DIAGNOSIS — F05 Delirium due to known physiological condition: Secondary | ICD-10-CM | POA: Diagnosis not present

## 2022-10-12 DIAGNOSIS — F0283 Dementia in other diseases classified elsewhere, unspecified severity, with mood disturbance: Secondary | ICD-10-CM | POA: Diagnosis not present

## 2022-10-12 DIAGNOSIS — J181 Lobar pneumonia, unspecified organism: Secondary | ICD-10-CM | POA: Diagnosis not present

## 2022-10-12 DIAGNOSIS — G319 Degenerative disease of nervous system, unspecified: Secondary | ICD-10-CM | POA: Diagnosis not present

## 2022-10-13 NOTE — Progress Notes (Signed)
  This encounter was created in error - please disregard. No show 

## 2022-10-17 DIAGNOSIS — F05 Delirium due to known physiological condition: Secondary | ICD-10-CM | POA: Diagnosis not present

## 2022-10-17 DIAGNOSIS — G319 Degenerative disease of nervous system, unspecified: Secondary | ICD-10-CM | POA: Diagnosis not present

## 2022-10-17 DIAGNOSIS — F02818 Dementia in other diseases classified elsewhere, unspecified severity, with other behavioral disturbance: Secondary | ICD-10-CM | POA: Diagnosis not present

## 2022-10-17 DIAGNOSIS — F0283 Dementia in other diseases classified elsewhere, unspecified severity, with mood disturbance: Secondary | ICD-10-CM | POA: Diagnosis not present

## 2022-10-17 DIAGNOSIS — J181 Lobar pneumonia, unspecified organism: Secondary | ICD-10-CM | POA: Diagnosis not present

## 2022-10-17 DIAGNOSIS — F0284 Dementia in other diseases classified elsewhere, unspecified severity, with anxiety: Secondary | ICD-10-CM | POA: Diagnosis not present

## 2022-10-19 ENCOUNTER — Ambulatory Visit (INDEPENDENT_AMBULATORY_CARE_PROVIDER_SITE_OTHER): Payer: Medicare Other | Admitting: Physician Assistant

## 2022-10-19 ENCOUNTER — Encounter: Payer: Self-pay | Admitting: Physician Assistant

## 2022-10-19 VITALS — BP 113/75 | HR 67 | Resp 20 | Ht 62.0 in

## 2022-10-19 DIAGNOSIS — F05 Delirium due to known physiological condition: Secondary | ICD-10-CM | POA: Diagnosis not present

## 2022-10-19 DIAGNOSIS — J181 Lobar pneumonia, unspecified organism: Secondary | ICD-10-CM | POA: Diagnosis not present

## 2022-10-19 DIAGNOSIS — F0284 Dementia in other diseases classified elsewhere, unspecified severity, with anxiety: Secondary | ICD-10-CM | POA: Diagnosis not present

## 2022-10-19 DIAGNOSIS — G319 Degenerative disease of nervous system, unspecified: Secondary | ICD-10-CM | POA: Diagnosis not present

## 2022-10-19 DIAGNOSIS — F01518 Vascular dementia, unspecified severity, with other behavioral disturbance: Secondary | ICD-10-CM | POA: Diagnosis not present

## 2022-10-19 DIAGNOSIS — F0283 Dementia in other diseases classified elsewhere, unspecified severity, with mood disturbance: Secondary | ICD-10-CM | POA: Diagnosis not present

## 2022-10-19 DIAGNOSIS — F02818 Dementia in other diseases classified elsewhere, unspecified severity, with other behavioral disturbance: Secondary | ICD-10-CM | POA: Diagnosis not present

## 2022-10-19 NOTE — Progress Notes (Signed)
Assessment/Plan:   Dementia likely due to   Andrea [name] is a very pleasant 87 y.o. RH female seen today in follow up for memory loss. Patient is currently on donepezil 10 mg daily. She had recent hospitalization for CAP. Symptoms resolved, but she did experienced increased agitation. She in on multiple psych medications, and her daughter is concerned and she may need adjustment of these meds.These include Rexulti, clonazepam, Buspar, Cymbalta as well as on Remeron and Desyrel.  She has an appt with her psychiatrist soon. Memory is stable.     Follow up in  6 months. Continue donepezil 10 mg daily. Side effects were discussed  Continue to control mood as per prescribing physician. She is on multiple meds, recommend adjustment  Recommend good control of cardiovascular risk factors.         Subjective:    This patient is accompanied in the office by her caregiver and her daughter who supplements the history.  Previous records as well as any outside records available were reviewed prior to todays visit. She was last seen on 07/25/22.    Any changes in memory since last visit? Memory is stable. She still recognizes her room. She does not like to participate in activities outside the home, she enjoys her time with her caregiver.  repeats oneself?  Endorsed Disoriented when walking into a room?  Patient denies   Leaving objects in unusual places?  Patient denies   Ambulates  with difficulty?   She has a wheelchair and a walker. She continues to do the exercises with her caregiver.  Recent falls?  Patient denies   Any head injuries?  Patient denies   History of seizures?   Patient denies   Wandering behavior?  Patient denies   Patient drives?   Patient no longer drives  Any mood changes since last visit?  She had acute agitation in the setting of PNA requiring hospitalization, She is now better controlled Any worsening depression?:  Patient denies   Hallucinations?  Patient denies    Paranoia?  Patient denies   Patient reports that sleeps well without vivid dreams, REM behavior or sleepwalking   History of sleep apnea?  Endorsed, she does not like to use the CPAP  Any hygiene concerns?  Patient denies   Independent of bathing and dressing?  Endorsed  Does the patient needs help with medications?  Caregiver in charge  Who is in charge of the finances? Daughter  is in charge   Any changes in appetite?  Patient denies  but she was not drinking enough water prior to there hospitalization Patient have trouble swallowing? Patient denies   Does the patient cook?  Patient denies   Any kitchen accidents such as leaving the stove on? Patient denies   Any headaches?  Patient denies   Double vision? Patient denies   Any focal numbness or tingling?  Patient denies   Chronic back pain Patient denies   Unilateral weakness?  Patient denies   Any tremors?  Patient denies   Any history of anosmia?  Patient denies   Any incontinence of urine?  Endorsed, frequent UTIS, wears pads Any bowel dysfunction?   Patient denies      Patient lives with:    Initial visit 10/17/2021 the patient is seen in neurologic consultation at the request of Ngetich, Dinah C, NP for the evaluation of memory loss.  The patient is accompanied by her caregiver who supplements the history.This is a 87 y.o. year old  female who has had worsening memory issues for about few months when she would get UTIs ( 5 bouts of them ).  However, the caregiver reports that her memory changes may have been present for at least 1-1/2-year, when she appears to be more disoriented, and has more trouble to remember new information, or people's names, or "mixes them ".  For example, she has a son who lives in [place], she cannot remember his name.  She denies repeating the same stories or asking the same questions.  However, she has been forgetting for example, how to sit in a chair or which way she needs to go when she is going from one  room to another.  The patient states that "it may have been there for a longer time than that, but I did not want to tell anyone".  She denies leaving objects in unusual places.  She lives with her daughter.  Her mood is usually good, only when the caregiver is not there, she may show more concern, and feel "more down".  When the caregiver is there, she has more social interaction.  She no longer drives.  She sleeps well, "a lot more than before ".  She denies any vivid dreams or sleepwalking.  No hallucinations or paranoia.  She uses a walker to ambulate, and a wheelchair at times.  In the past, she tried physical therapy 2-3 times a week, but she is no longer interested, "she did not like it ".  No recent falls or head injuries.  The caregiver is not aware of any hygiene concerns, she needs assistance with bathing and dressing.  The caregiver is in charge of the medications, and the finances are taking care of by the daughter.  Her appetite is good, denies trouble swallowing.  She no longer cooks. Denies headaches, double vision, dizziness, focal numbness or tingling, unilateral weakness or tremors or anosmia. No history of seizures.  As mentioned above, she has frequent UTIs, wears pads and pull ups , retention, denies constipation or diarrhea.  She denies anosmia, she did not have any recent COVID.  She has OSA, but lately has not been using the CPAP, because "makes me cold ".  She denies any alcohol or tobacco abuse.  Family history negative for Alzheimer's disease or any other type of dementia.   Labs 05/2021 TSH 1.26, B12 1272 PREVIOUS MEDICATIONS:   CURRENT MEDICATIONS:  Outpatient Encounter Medications as of 10/19/2022  Medication Sig   albuterol (VENTOLIN HFA) 108 (90 Base) MCG/ACT inhaler Inhale 2 puffs into the lungs every 6 (six) hours as needed for wheezing or shortness of breath.   brexpiprazole (REXULTI) 2 MG TABS tablet Take 2 mg by mouth at bedtime.   busPIRone (BUSPAR) 5 MG tablet Take  5-10 mg by mouth 2 (two) times daily. 10 MG IN THE MORNING, 5 MG AT DINNER   CALCIUM CITRATE PO Take 600 mg by mouth daily.    clonazePAM (KLONOPIN) 0.5 MG tablet Take 0.25 mg by mouth as needed for anxiety.   D-Mannose 500 MG CAPS Take by mouth daily.   donepezil (ARICEPT) 10 MG tablet Take 1 tablet (10 mg total) by mouth at bedtime. Take 1  tablet at 10 mg daily   DULoxetine (CYMBALTA) 30 MG capsule Take 90 mg by mouth daily.   ELIQUIS 5 MG TABS tablet TAKE 1 TABLET BY MOUTH TWICE A DAY   furosemide (LASIX) 20 MG tablet TAKE 1 TABLET BY MOUTH EVERY DAY   KLOR-CON M20  20 MEQ tablet TAKE 1 TABLET BY MOUTH EVERY DAY   levothyroxine (SYNTHROID) 50 MCG tablet TAKE 1 TABLET BY MOUTH DAILY BEFORE BREAKFAST. DX E03.9   mirtazapine (REMERON) 15 MG tablet Take 7.5 mg by mouth at bedtime.   Multiple Vitamins-Minerals (MULTIVITAMIN PO) Take 1 tablet by mouth daily.    oxybutynin (DITROPAN-XL) 10 MG 24 hr tablet Take 10 mg by mouth daily.   Probiotic Product (PROBIOTIC DAILY PO) Take 1 tablet by mouth daily. OTC   senna-docusate (SENOKOT-S) 8.6-50 MG tablet Take 1 tablet by mouth 2 (two) times daily as needed for mild constipation.   traMADol (ULTRAM) 50 MG tablet Take 1 tablet (50 mg total) by mouth every 6 (six) hours as needed for moderate pain.   traZODone (DESYREL) 100 MG tablet Take 100 mg by mouth at bedtime.   trimethoprim (TRIMPEX) 100 MG tablet Take 100 mg by mouth at bedtime.   vitamin B-12 (CYANOCOBALAMIN) 500 MCG tablet Take 500 mcg by mouth daily.   Vitamin D, Ergocalciferol, (DRISDOL) 1.25 MG (50000 UNIT) CAPS capsule Take 50,000 Units by mouth once a week.   No facility-administered encounter medications on file as of 10/19/2022.       09/29/2021    3:52 PM 12/10/2018    1:20 PM 12/03/2017    8:57 AM  MMSE - Mini Mental State Exam  Orientation to time '1 5 5  '$ Orientation to Place '2 4 4  '$ Registration '2 3 3  '$ Attention/ Calculation 0 4 5  Recall '2 2 1  '$ Language- name 2 objects '2 2 2   '$ Language- repeat '1 1 1  '$ Language- follow 3 step command '2 2 3  '$ Language- read & follow direction '1 1 1  '$ Write a sentence 0 1 1  Copy design 0 0 1  Total score '13 25 27       '$ No data to display          Objective:     PHYSICAL EXAMINATION:    VITALS:   Vitals:   10/19/22 1523  BP: 113/75  Pulse: 67  Resp: 20  SpO2: 97%  Height: '5\' 2"'$  (1.575 m)    GEN:  The patient appears stated age and is in NAD. HEENT:  Normocephalic, atraumatic.   Neurological examination:  General: NAD, well-groomed, appears stated age. Orientation: The patient is alert. Oriented to person, place and not to date Cranial nerves: There is good facial symmetry. Anxious appearing. The speech is fluent and clear. No aphasia or dysarthria. Fund of knowledge is appropriate. Recent and remote memory are impaired. Attention and concentration are reduced.  Able to name objects and repeat phrases.  Hearing is intact to conversational tone.    Sensation: Sensation is intact to light touch throughout Motor: Strength is at least antigravity x4. Tremors: none  DTR's 2/4 in UE/LE     Movement examination: Tone: There is normal tone in the UE/LE Abnormal movements:  no tremor.  No myoclonus.  No asterixis.   Coordination:  There is no decremation with RAM's. Normal finger to nose  Gait and Station: Unable to test gait, patient in wheelchair.    Thank you for allowing [place] the opportunity to participate in the care of this nice patient. Please do not hesitate to contact us for any questions or concerns.   Total time spent on today's visit was 40 minutes dedicated to this patient today, preparing to see patient, examining the patient, ordering tests and/or medications and counseling the patient, documenting clinical  information in the EHR or other health record, independently interpreting results and communicating results to the patient/family, discussing treatment and goals, answering patient's questions and  coordinating care.  Cc:  Ngetich, Nelda Bucks, NP  Sharene Butters 10/21/2022 11:56 AM

## 2022-10-19 NOTE — Patient Instructions (Addendum)
It was a pleasure to see you today at our office.   Recommendations:  Continue Aricept 10 mg daily.  Follow up in 6 months, cancel the incoming appt in march 2023  Continue following up with Psychiatrist  for depression and possible med adjustment  Continue to hydrate to avoid UTIs  Feel free to visit Facebook page " [employer]" for tips of how to care for people with dementia   RECOMMENDATIONS FOR ALL PATIENTS WITH MEMORY PROBLEMS: 1. Continue to exercise (Recommend 30 minutes of walking everyday, or 3 hours every week) 2. Increase social interactions - continue going to [place] and enjoy social gatherings with friends and family 3. Eat healthy, avoid fried foods and eat more fruits and vegetables 4. Maintain adequate blood pressure, blood sugar, and blood cholesterol level. Reducing the risk of stroke and cardiovascular disease also helps promoting better memory. 5. Avoid stressful situations. Live a simple life and avoid aggravations. Organize your time and prepare for the next day in anticipation. 6. Sleep well, avoid any interruptions of sleep and avoid any distractions in the bedroom that may interfere with adequate sleep quality 7. Avoid sugar, avoid sweets as there is a strong link between excessive sugar intake, diabetes, and cognitive impairment We discussed the Mediterranean diet, which has been shown to help patients reduce the risk of progressive memory disorders and reduces cardiovascular risk. This includes eating fish, eat fruits and green leafy vegetables, nuts like almonds and hazelnuts, walnuts, and also use olive oil. Avoid fast foods and fried foods as much as possible. Avoid sweets and sugar as sugar use has been linked to worsening of memory function.  There is always a concern of gradual progression of memory problems. If this is the case, then we may need to adjust level of care according to patient needs. Support, both to the patient and caregiver, should then be put into  place.    FALL PRECAUTIONS: Be cautious when walking. Scan the area for obstacles that may increase the risk of trips and falls. When getting up in the mornings, sit up at the edge of the bed for a few minutes before getting out of bed. Consider elevating the bed at the head end to avoid drop of blood pressure when getting up. Walk always in a well-lit room (use night lights in the walls). Avoid area rugs or power cords from appliances in the middle of the walkways. Use a walker or a cane if necessary and consider physical therapy for balance exercise. Get your eyesight checked regularly.  FINANCIAL OVERSIGHT: Supervision, especially oversight when making financial decisions or transactions is also recommended.  HOME SAFETY: Consider the safety of the kitchen when operating appliances like stoves, microwave oven, and blender. Consider having supervision and share cooking responsibilities until no longer able to participate in those. Accidents with firearms and other hazards in the house should be identified and addressed as well.   ABILITY TO BE LEFT ALONE: If patient is unable to contact 911 operator, consider using [employer], or when the need is there, arrange for someone to stay with patients. Smoking is a fire hazard, consider supervision or cessation. Risk of wandering should be assessed by caregiver and if detected at any point, supervision and safe proof recommendations should be instituted.  MEDICATION SUPERVISION: Inability to self-administer medication needs to be constantly addressed. Implement a mechanism to ensure safe administration of the medications.

## 2022-10-24 DIAGNOSIS — F05 Delirium due to known physiological condition: Secondary | ICD-10-CM | POA: Diagnosis not present

## 2022-10-24 DIAGNOSIS — F0283 Dementia in other diseases classified elsewhere, unspecified severity, with mood disturbance: Secondary | ICD-10-CM | POA: Diagnosis not present

## 2022-10-24 DIAGNOSIS — G319 Degenerative disease of nervous system, unspecified: Secondary | ICD-10-CM | POA: Diagnosis not present

## 2022-10-24 DIAGNOSIS — F02818 Dementia in other diseases classified elsewhere, unspecified severity, with other behavioral disturbance: Secondary | ICD-10-CM | POA: Diagnosis not present

## 2022-10-24 DIAGNOSIS — F0284 Dementia in other diseases classified elsewhere, unspecified severity, with anxiety: Secondary | ICD-10-CM | POA: Diagnosis not present

## 2022-10-24 DIAGNOSIS — J181 Lobar pneumonia, unspecified organism: Secondary | ICD-10-CM | POA: Diagnosis not present

## 2022-10-26 DIAGNOSIS — F05 Delirium due to known physiological condition: Secondary | ICD-10-CM | POA: Diagnosis not present

## 2022-10-26 DIAGNOSIS — J181 Lobar pneumonia, unspecified organism: Secondary | ICD-10-CM | POA: Diagnosis not present

## 2022-10-26 DIAGNOSIS — F0284 Dementia in other diseases classified elsewhere, unspecified severity, with anxiety: Secondary | ICD-10-CM | POA: Diagnosis not present

## 2022-10-26 DIAGNOSIS — F02818 Dementia in other diseases classified elsewhere, unspecified severity, with other behavioral disturbance: Secondary | ICD-10-CM | POA: Diagnosis not present

## 2022-10-26 DIAGNOSIS — G319 Degenerative disease of nervous system, unspecified: Secondary | ICD-10-CM | POA: Diagnosis not present

## 2022-10-26 DIAGNOSIS — F0283 Dementia in other diseases classified elsewhere, unspecified severity, with mood disturbance: Secondary | ICD-10-CM | POA: Diagnosis not present

## 2022-10-31 DIAGNOSIS — J181 Lobar pneumonia, unspecified organism: Secondary | ICD-10-CM | POA: Diagnosis not present

## 2022-10-31 DIAGNOSIS — G319 Degenerative disease of nervous system, unspecified: Secondary | ICD-10-CM | POA: Diagnosis not present

## 2022-10-31 DIAGNOSIS — F0284 Dementia in other diseases classified elsewhere, unspecified severity, with anxiety: Secondary | ICD-10-CM | POA: Diagnosis not present

## 2022-10-31 DIAGNOSIS — F05 Delirium due to known physiological condition: Secondary | ICD-10-CM | POA: Diagnosis not present

## 2022-10-31 DIAGNOSIS — F0283 Dementia in other diseases classified elsewhere, unspecified severity, with mood disturbance: Secondary | ICD-10-CM | POA: Diagnosis not present

## 2022-10-31 DIAGNOSIS — F02818 Dementia in other diseases classified elsewhere, unspecified severity, with other behavioral disturbance: Secondary | ICD-10-CM | POA: Diagnosis not present

## 2022-11-02 DIAGNOSIS — F418 Other specified anxiety disorders: Secondary | ICD-10-CM | POA: Diagnosis not present

## 2022-11-02 DIAGNOSIS — M19041 Primary osteoarthritis, right hand: Secondary | ICD-10-CM | POA: Diagnosis not present

## 2022-11-02 DIAGNOSIS — E039 Hypothyroidism, unspecified: Secondary | ICD-10-CM | POA: Diagnosis not present

## 2022-11-02 DIAGNOSIS — Z7901 Long term (current) use of anticoagulants: Secondary | ICD-10-CM | POA: Diagnosis not present

## 2022-11-02 DIAGNOSIS — Z96653 Presence of artificial knee joint, bilateral: Secondary | ICD-10-CM | POA: Diagnosis not present

## 2022-11-02 DIAGNOSIS — M479 Spondylosis, unspecified: Secondary | ICD-10-CM | POA: Diagnosis not present

## 2022-11-02 DIAGNOSIS — E785 Hyperlipidemia, unspecified: Secondary | ICD-10-CM | POA: Diagnosis not present

## 2022-11-02 DIAGNOSIS — Z853 Personal history of malignant neoplasm of breast: Secondary | ICD-10-CM | POA: Diagnosis not present

## 2022-11-02 DIAGNOSIS — Z9012 Acquired absence of left breast and nipple: Secondary | ICD-10-CM | POA: Diagnosis not present

## 2022-11-02 DIAGNOSIS — M5416 Radiculopathy, lumbar region: Secondary | ICD-10-CM | POA: Diagnosis not present

## 2022-11-02 DIAGNOSIS — G4733 Obstructive sleep apnea (adult) (pediatric): Secondary | ICD-10-CM | POA: Diagnosis not present

## 2022-11-02 DIAGNOSIS — F05 Delirium due to known physiological condition: Secondary | ICD-10-CM | POA: Diagnosis not present

## 2022-11-02 DIAGNOSIS — Z86718 Personal history of other venous thrombosis and embolism: Secondary | ICD-10-CM | POA: Diagnosis not present

## 2022-11-02 DIAGNOSIS — D649 Anemia, unspecified: Secondary | ICD-10-CM | POA: Diagnosis not present

## 2022-11-02 DIAGNOSIS — Z87891 Personal history of nicotine dependence: Secondary | ICD-10-CM | POA: Diagnosis not present

## 2022-11-02 DIAGNOSIS — Z981 Arthrodesis status: Secondary | ICD-10-CM | POA: Diagnosis not present

## 2022-11-02 DIAGNOSIS — F0283 Dementia in other diseases classified elsewhere, unspecified severity, with mood disturbance: Secondary | ICD-10-CM | POA: Diagnosis not present

## 2022-11-02 DIAGNOSIS — I480 Paroxysmal atrial fibrillation: Secondary | ICD-10-CM | POA: Diagnosis not present

## 2022-11-02 DIAGNOSIS — F02818 Dementia in other diseases classified elsewhere, unspecified severity, with other behavioral disturbance: Secondary | ICD-10-CM | POA: Diagnosis not present

## 2022-11-02 DIAGNOSIS — J181 Lobar pneumonia, unspecified organism: Secondary | ICD-10-CM | POA: Diagnosis not present

## 2022-11-02 DIAGNOSIS — G319 Degenerative disease of nervous system, unspecified: Secondary | ICD-10-CM | POA: Diagnosis not present

## 2022-11-02 DIAGNOSIS — G9341 Metabolic encephalopathy: Secondary | ICD-10-CM | POA: Diagnosis not present

## 2022-11-02 DIAGNOSIS — M19042 Primary osteoarthritis, left hand: Secondary | ICD-10-CM | POA: Diagnosis not present

## 2022-11-02 DIAGNOSIS — E1169 Type 2 diabetes mellitus with other specified complication: Secondary | ICD-10-CM | POA: Diagnosis not present

## 2022-11-02 DIAGNOSIS — F0284 Dementia in other diseases classified elsewhere, unspecified severity, with anxiety: Secondary | ICD-10-CM | POA: Diagnosis not present

## 2022-11-07 DIAGNOSIS — F0284 Dementia in other diseases classified elsewhere, unspecified severity, with anxiety: Secondary | ICD-10-CM | POA: Diagnosis not present

## 2022-11-07 DIAGNOSIS — F05 Delirium due to known physiological condition: Secondary | ICD-10-CM | POA: Diagnosis not present

## 2022-11-07 DIAGNOSIS — J181 Lobar pneumonia, unspecified organism: Secondary | ICD-10-CM | POA: Diagnosis not present

## 2022-11-07 DIAGNOSIS — F0283 Dementia in other diseases classified elsewhere, unspecified severity, with mood disturbance: Secondary | ICD-10-CM | POA: Diagnosis not present

## 2022-11-07 DIAGNOSIS — F02818 Dementia in other diseases classified elsewhere, unspecified severity, with other behavioral disturbance: Secondary | ICD-10-CM | POA: Diagnosis not present

## 2022-11-07 DIAGNOSIS — G319 Degenerative disease of nervous system, unspecified: Secondary | ICD-10-CM | POA: Diagnosis not present

## 2022-11-08 DIAGNOSIS — L603 Nail dystrophy: Secondary | ICD-10-CM | POA: Diagnosis not present

## 2022-11-08 DIAGNOSIS — I739 Peripheral vascular disease, unspecified: Secondary | ICD-10-CM | POA: Diagnosis not present

## 2022-11-14 DIAGNOSIS — F05 Delirium due to known physiological condition: Secondary | ICD-10-CM | POA: Diagnosis not present

## 2022-11-14 DIAGNOSIS — F0283 Dementia in other diseases classified elsewhere, unspecified severity, with mood disturbance: Secondary | ICD-10-CM | POA: Diagnosis not present

## 2022-11-14 DIAGNOSIS — G319 Degenerative disease of nervous system, unspecified: Secondary | ICD-10-CM | POA: Diagnosis not present

## 2022-11-14 DIAGNOSIS — F0284 Dementia in other diseases classified elsewhere, unspecified severity, with anxiety: Secondary | ICD-10-CM | POA: Diagnosis not present

## 2022-11-14 DIAGNOSIS — J181 Lobar pneumonia, unspecified organism: Secondary | ICD-10-CM | POA: Diagnosis not present

## 2022-11-14 DIAGNOSIS — F02818 Dementia in other diseases classified elsewhere, unspecified severity, with other behavioral disturbance: Secondary | ICD-10-CM | POA: Diagnosis not present

## 2022-11-21 DIAGNOSIS — G319 Degenerative disease of nervous system, unspecified: Secondary | ICD-10-CM | POA: Diagnosis not present

## 2022-11-21 DIAGNOSIS — F0283 Dementia in other diseases classified elsewhere, unspecified severity, with mood disturbance: Secondary | ICD-10-CM | POA: Diagnosis not present

## 2022-11-21 DIAGNOSIS — J181 Lobar pneumonia, unspecified organism: Secondary | ICD-10-CM | POA: Diagnosis not present

## 2022-11-21 DIAGNOSIS — F0284 Dementia in other diseases classified elsewhere, unspecified severity, with anxiety: Secondary | ICD-10-CM | POA: Diagnosis not present

## 2022-11-21 DIAGNOSIS — F02818 Dementia in other diseases classified elsewhere, unspecified severity, with other behavioral disturbance: Secondary | ICD-10-CM | POA: Diagnosis not present

## 2022-11-21 DIAGNOSIS — F05 Delirium due to known physiological condition: Secondary | ICD-10-CM | POA: Diagnosis not present

## 2022-11-22 DIAGNOSIS — F332 Major depressive disorder, recurrent severe without psychotic features: Secondary | ICD-10-CM | POA: Diagnosis not present

## 2022-11-22 DIAGNOSIS — F341 Dysthymic disorder: Secondary | ICD-10-CM | POA: Diagnosis not present

## 2022-11-22 DIAGNOSIS — F411 Generalized anxiety disorder: Secondary | ICD-10-CM | POA: Diagnosis not present

## 2022-11-27 ENCOUNTER — Encounter: Payer: Self-pay | Admitting: Family

## 2022-11-27 ENCOUNTER — Ambulatory Visit (INDEPENDENT_AMBULATORY_CARE_PROVIDER_SITE_OTHER): Payer: Medicare Other | Admitting: Family

## 2022-11-27 VITALS — BP 128/68 | HR 76 | Temp 97.4°F | Resp 17 | Ht 62.0 in | Wt 198.4 lb

## 2022-11-27 DIAGNOSIS — E1169 Type 2 diabetes mellitus with other specified complication: Secondary | ICD-10-CM

## 2022-11-27 DIAGNOSIS — F341 Dysthymic disorder: Secondary | ICD-10-CM

## 2022-11-27 DIAGNOSIS — I480 Paroxysmal atrial fibrillation: Secondary | ICD-10-CM | POA: Diagnosis not present

## 2022-11-27 DIAGNOSIS — E039 Hypothyroidism, unspecified: Secondary | ICD-10-CM | POA: Diagnosis not present

## 2022-11-27 NOTE — Patient Instructions (Addendum)
Please contact your local pharmacy, previous provider, or insurance carrier for vaccine/immunization records. Ensure that any procedures done outside of [facility] and Adult Medicine are faxed to [place] [phone] or you can sign release of records form at the front desk to keep your medical record updated.    Please Get COVID-19 and Shingles vaccine at the pharmacy

## 2022-11-27 NOTE — Progress Notes (Signed)
Provider: Marlowe Sax FNP-C   Kerem Gilmer, Nelda Bucks, NP  Patient Care Team: Reily Treloar, Nelda Bucks, NP as PCP - General (Family Medicine) Tanda Rockers, MD as Consulting Physician (Pulmonary Disease) Deveron Furlong, NP as Nurse Practitioner (Psychiatry)  Extended Emergency Contact Information Primary Emergency Contact: Wandell,Roberta(Bobbi) Address: [address]          [place], [place] [address] [name] of [place] Phone: [phone] Mobile Phone: [phone] Relation: Daughter Secondary Emergency Contact: Gaughran,David Address: [address]           [place], [place] [address] [place] Phone: [phone] Work Phone: [phone] Relation: Son  Code Status:  DNR Goals of care: Advanced Directive information    11/27/2022    9:35 AM  Advanced Directives  Does Patient Have a Medical Advance Directive? Yes  Type of Advance Directive Out of facility DNR (pink MOST or yellow form)  Does patient want to make changes to medical advance directive? No - Patient declined     Chief Complaint  Patient presents with   Medical Management of Chronic Issues    6 month follow up.    Health Maintenance    Discuss the need for Eye exam, Foot exam, AWV, and Diabetic Kidney Evaluation.    Immunizations    Discuss the need for Covid Booster, and Shingrix vaccine.    HPI:  Pt is a 88 y.o. female seen today for 6 month follow up for medical management of chronic diseases.She is here with her care giver.continue to require total care assistance with her ADL's. She denies any acute issues. No home CBG for review.Care giver reports no episodes of hypoglycemia.states patient just shakes whenever she tries to transfer from wheelchair. She recently completed Physical Therapy. Due for annual eye exam.Has upcoming appointment with Opthalmologic. She was seen by Podiatrist 3 months ago.denies any numbness or tingling on feet.No  podiatrist notes for review. Care giver  states patient fasting today for labs.   Past Medical History:  Diagnosis Date   A-fib ([facility])    Anemia    Anxiety    Arthritis    hands, spine    Bladder incontinence    Blood transfusion    Cancer ([facility]) 02/2019   left breast cancer   Cervicalgia    Constipation    Cough    Wert-onset 08/2009, as of 2014- resolved    Deep venous thrombosis ([facility])    post op, rec'd/needed  blood thinner    Depression    Depression with anxiety    Dysrhythmia    a-fib   Falls frequently    pt. reports that she was falling 3 times a day, has had PT at a facility for a while & now is getting home PT 2-3 times/ week    Headache(784.0) 09/27/2011   History of stress test    15-20 yrs.ago    Hypotension    Hypothyroidism    Lumbar radiculopathy 09/26/2011   Pneumonia    never been in hosp. for pneumonia    Prediabetes    Right hand fracture    Monday   Shortness of breath    Sleep apnea    does not use consistently   Past Surgical History:  Procedure Laterality Date   ANTERIOR CERVICAL DECOMP/DISCECTOMY FUSION N/A 07/09/2013   Procedure: ANTERIOR CERVICAL DECOMPRESSION/DISCECTOMY FUSION 3 LEVELS;  Surgeon: Sinclair Ship, MD;  Location: [facility];  Service: Orthopedics;  Laterality: N/A;  Anterior cervical decompression fusion, cervical  3-4, cervical 4-5-, cervical 5-6 with instrumentation, allograft.   APPENDECTOMY     BACK SURGERY     2000   BREAST EXCISIONAL BIOPSY     breast bxs on left x 2, breast bx of right x 1-all benign   EYE SURGERY     cataracts bilateral /w IOL   HERNIA REPAIR     umbilical hernia- 0240   IR IMAGING GUIDED PORT INSERTION  04/28/2019   JOINT REPLACEMENT  2006,2007   bilateral   LAPAROSCOPIC OVARIAN CYSTECTOMY     not done by laparscopy-abdominal incision   LEG SURGERY     ORIF PERIPROSTHETIC FRACTURE Right 07/26/2019   Procedure: OPEN REDUCTION INTERNAL FIXATION (ORIF) RIGHT FEMUR PERIPROSTHETIC FRACTURE;  Surgeon: Rod Can, MD;  Location: [facility];   Service: Orthopedics;  Laterality: Right;   PORTACATH PLACEMENT Right 04/01/2019   Procedure: ATTEMPTED INSERTION PORT-A-CATH WITH ULTRASOUND;  Surgeon: [name], MD;  Location: [facility];  Service: General;  Laterality: Right;   RADIOACTIVE SEED GUIDED PARTIAL MASTECTOMY WITH AXILLARY SENTINEL LYMPH NODE BIOPSY Left 04/01/2019   Procedure: LEFT BREAST PARTIAL MASTECTOMY WITH RADIOACTIVE SEED AND LEFT SENTINEL LYMPH NODE BIOPSY;  Surgeon: [name], MD;  Location: [facility];  Service: General;  Laterality: Left;   RE-EXCISION OF BREAST CANCER,SUPERIOR MARGINS Left 04/15/2019   Procedure: RE-EXCISION OF LEFT BREAST CANCER POSITIVE MARGINS;  Surgeon: [name], MD;  Location: [facility];  Service: General;  Laterality: Left;   REPLACEMENT TOTAL KNEE BILATERAL     TONSILLECTOMY      Allergies  Allergen Reactions   Clindamycin Hcl Other (See Comments)    REACTION: swelling, pt. Reports that she had a 16 lb. weightgain in one day    Allergies as of 11/27/2022       Reactions   Clindamycin Hcl Other (See Comments)   REACTION: swelling, pt. Reports that she had a 16 lb. weightgain in one day        Medication List        Accurate as of November 27, 2022 10:01 AM. If you have any questions, ask your nurse or doctor.          albuterol 108 (90 Base) MCG/ACT inhaler Commonly known as: VENTOLIN HFA Inhale 2 puffs into the lungs every 6 (six) hours as needed for wheezing or shortness of breath.   brexpiprazole 2 MG Tabs tablet Commonly known as: REXULTI Take 2 mg by mouth at bedtime.   busPIRone 5 MG tablet Commonly known as: BUSPAR Take 5-10 mg by mouth 2 (two) times daily. 10 MG IN THE MORNING, 5 MG AT DINNER   CALCIUM CITRATE PO Take 600 mg by mouth daily.   clonazePAM 0.5 MG tablet Commonly known as: KLONOPIN Take 0.25 mg by mouth as needed for anxiety.   D-Mannose 500 MG Caps Take by mouth daily.    donepezil 10 MG tablet Commonly known as: ARICEPT Take 1 tablet (10 mg total) by mouth at bedtime. Take 1  tablet at 10 mg daily   DULoxetine 30 MG capsule Commonly known as: CYMBALTA Take 90 mg by mouth daily.   Eliquis 5 MG Tabs tablet Generic drug: apixaban TAKE 1 TABLET BY MOUTH TWICE A DAY   furosemide 20 MG tablet Commonly known as: LASIX TAKE 1 TABLET BY MOUTH EVERY DAY   Klor-Con M20 20 MEQ tablet Generic drug: potassium chloride SA TAKE 1 TABLET BY MOUTH EVERY DAY   levothyroxine 50 MCG tablet Commonly  known as: SYNTHROID TAKE 1 TABLET BY MOUTH DAILY BEFORE BREAKFAST. DX E03.9   mirtazapine 15 MG tablet Commonly known as: REMERON Take 7.5 mg by mouth at bedtime.   MULTIVITAMIN PO Take 1 tablet by mouth daily.   oxybutynin 10 MG 24 hr tablet Commonly known as: DITROPAN-XL Take 10 mg by mouth daily.   PROBIOTIC DAILY PO Take 1 tablet by mouth daily. OTC   senna-docusate 8.6-50 MG tablet Commonly known as: Senokot-S Take 1 tablet by mouth 2 (two) times daily as needed for mild constipation.   traZODone 100 MG tablet Commonly known as: DESYREL Take 100 mg by mouth at bedtime.   trimethoprim 100 MG tablet Commonly known as: TRIMPEX Take 100 mg by mouth at bedtime.   vitamin B-12 500 MCG tablet Commonly known as: CYANOCOBALAMIN Take 500 mcg by mouth daily.   Vitamin D (Ergocalciferol) 1.25 MG (50000 UNIT) Caps capsule Commonly known as: DRISDOL Take 50,000 Units by mouth once a week.        Review of Systems  Unable to perform ROS: Dementia (HPI information provided by care giver during visit)  Constitutional:  Negative for appetite change, chills, fatigue, fever and unexpected weight change.  HENT:  Positive for hearing loss. Negative for congestion, dental problem, ear discharge, ear pain, facial swelling, nosebleeds, postnasal drip, rhinorrhea, sinus pressure, sinus pain, sneezing, sore throat, tinnitus and trouble swallowing.        Wears  bilateral hearing Aids   Eyes:  Positive for visual disturbance. Negative for pain, discharge, redness and itching.       Eye glasses in place   Respiratory:  Negative for cough, chest tightness, shortness of breath and wheezing.   Cardiovascular:  Negative for chest pain, palpitations and leg swelling.  Gastrointestinal:  Negative for abdominal distention, abdominal pain, blood in stool, constipation, diarrhea, nausea and vomiting.  Endocrine: Negative for cold intolerance, heat intolerance, polydipsia, polyphagia and polyuria.  Genitourinary:  Negative for difficulty urinating, dysuria, flank pain, frequency and urgency.       Incontinent   Musculoskeletal:  Positive for arthralgias and gait problem. Negative for back pain, joint swelling, myalgias, neck pain and neck stiffness.       Knee pain topical analgesic effective   Skin:  Negative for color change, pallor, rash and wound.  Neurological:  Negative for dizziness, syncope, speech difficulty, weakness, light-headedness, numbness and headaches.  Hematological:  Does not bruise/bleed easily.  Psychiatric/Behavioral:  Negative for agitation, behavioral problems, confusion, hallucinations, self-injury, sleep disturbance and suicidal ideas. The patient is not nervous/anxious.        Follows up with Therapist     Immunization History  Administered Date(s) Administered   Fluad Quad(high Dose 65+) 07/22/2020, 09/29/2022   Influenza Split 08/14/2013   Influenza Whole 08/20/2009   Influenza, High Dose Seasonal PF 09/11/2017, 07/25/2018, 09/14/2021   Influenza,inj,Quad PF,6+ Mos 08/04/2014, 09/06/2015, 07/10/2016   PFIZER Comirnaty(Gray Top)Covid-19 Tri-Sucrose Vaccine 02/23/2021   PFIZER(Purple Top)SARS-COV-2 Vaccination 12/27/2019, 01/17/2020, 08/30/2020, 09/14/2021   Pneumococcal Conjugate-13 01/14/2015   Pneumococcal Polysaccharide-23 07/11/2013   Td 11/20/2005, 11/27/2016   Pertinent  Health Maintenance Due  Topic Date Due    OPHTHALMOLOGY EXAM  02/16/2021   FOOT EXAM  07/22/2021   HEMOGLOBIN A1C  11/25/2022   INFLUENZA VACCINE  Completed   DEXA SCAN  Completed      09/29/2022    8:30 PM 09/30/2022    8:00 AM 10/06/2022   11:06 AM 10/19/2022    3:28 PM 11/27/2022  9:35 AM  Fall Risk  Falls in the past year?   '1 1 1  '$ Was there an injury with Fall?   1 0 1  Fall Risk Category Calculator   '2 2 2  '$ Fall Risk Category   Moderate Moderate Moderate  Patient Fall Risk Level High fall risk High fall risk Moderate fall risk High fall risk Moderate fall risk  Patient at Risk for Falls Due to   History of fall(s);Impaired balance/gait;Impaired mobility  History of fall(s);Impaired balance/gait;Impaired mobility  Fall risk Follow up   Falls evaluation completed;Education provided;Falls prevention discussed Falls evaluation completed Falls evaluation completed;Education provided;Falls prevention discussed   Functional Status Survey:    Vitals:   11/27/22 0930  BP: 128/68  Pulse: 76  Resp: 17  Temp: (!) 97.4 F (36.3 C)  SpO2: 94%  Weight: 198 lb 6.4 oz (90 kg)  Height: '5\' 2"'$  (1.575 m)   Body mass index is 36.29 kg/m. Physical Exam Vitals reviewed.  Constitutional:      General: She is not in acute distress.    Appearance: Normal appearance. She is obese. She is not ill-appearing or diaphoretic.  HENT:     Head: Normocephalic.     Right Ear: Tympanic membrane, ear canal and external ear normal. There is no impacted cerumen.     Left Ear: Tympanic membrane, ear canal and external ear normal. There is no impacted cerumen.     Ears:     Comments: Bilateral hearing aids in place     Nose: Nose normal. No congestion or rhinorrhea.     Mouth/Throat:     Mouth: Mucous membranes are moist.     Pharynx: Oropharynx is clear. No oropharyngeal exudate or posterior oropharyngeal erythema.  Eyes:     General: No scleral icterus.       Right eye: No discharge.        Left eye: No discharge.     Extraocular  Movements: Extraocular movements intact.     Conjunctiva/sclera: Conjunctivae normal.     Pupils: Pupils are equal, round, and reactive to light.     Comments: Corrective lens in place   Neck:     Vascular: No carotid bruit.  Cardiovascular:     Rate and Rhythm: Normal rate and regular rhythm.     Pulses: Normal pulses.     Heart sounds: Normal heart sounds. No murmur heard.    No friction rub. No gallop.  Pulmonary:     Effort: Pulmonary effort is normal. No respiratory distress.     Breath sounds: Normal breath sounds. No wheezing, rhonchi or rales.  Chest:     Chest wall: No tenderness.  Abdominal:     General: Bowel sounds are normal. There is no distension.     Palpations: Abdomen is soft. There is no mass.     Tenderness: There is no abdominal tenderness. There is no right CVA tenderness, left CVA tenderness, guarding or rebound.  Musculoskeletal:        General: No swelling or tenderness. Normal range of motion.     Cervical back: Normal range of motion. No rigidity or tenderness.     Right lower leg: No edema.     Left lower leg: No edema.     Comments: On wheelchair during exam   Lymphadenopathy:     Cervical: No cervical adenopathy.  Skin:    General: Skin is warm and dry.     Coloration: Skin is not pale.  Findings: No bruising, erythema, lesion or rash.  Neurological:     Mental Status: She is alert and oriented to person, place, and time.     Cranial Nerves: No cranial nerve deficit.     Sensory: No sensory deficit.     Motor: No weakness.     Coordination: Coordination normal.     Gait: Gait abnormal.  Psychiatric:        Mood and Affect: Mood normal.        Speech: Speech normal.        Behavior: Behavior normal.        Thought Content: Thought content normal.        Judgment: Judgment normal.   Labs reviewed: Recent Labs    05/25/22 0944 09/27/22 1309 10/06/22 1132  NA 139 144 141  K 4.0 4.4 4.5  CL 102 110 105  CO2 '28 26 29  '$ GLUCOSE 107*  113* 105*  BUN '21 23 22  '$ CREATININE 1.04* 0.95 0.95  CALCIUM 9.4 9.6 8.9   Recent Labs    02/20/22 1054 05/25/22 0944 09/27/22 1309  AST '15 16 19  '$ ALT '9 9 14  '$ ALKPHOS  --   --  67  BILITOT 0.4 0.4 0.6  PROT 6.8 6.4 6.7  ALBUMIN  --   --  3.3*   Recent Labs    05/25/22 0944 09/27/22 1309 10/06/22 1132  WBC 6.2 9.7 6.5  NEUTROABS 4,210 7.2 4,817  HGB 11.2* 9.9* 9.7*  HCT 35.7 33.3* 31.3*  MCV 85.8 81.6 79.2*  PLT 286 280 337   Lab Results  Component Value Date   TSH 0.650 09/27/2022   Lab Results  Component Value Date   HGBA1C 5.0 05/25/2022   Lab Results  Component Value Date   CHOL 204 (H) 05/25/2022   HDL 59 05/25/2022   LDLCALC 122 (H) 05/25/2022   TRIG 115 05/25/2022   CHOLHDL 3.5 05/25/2022    Significant Diagnostic Results in last 30 days:  No results found.  Assessment/Plan 1. Type 2 diabetes mellitus with other specified complication, without long-term current use of insulin (HCC) Lab Results  Component Value Date   HGBA1C 5.0 05/25/2022  -Diet controlled - CMP with eGFR([facility]) - Hemoglobin A1c  2. Acquired hypothyroidism Lab Results  Component Value Date   TSH 0.650 09/27/2022  -Continue on levothyroxine 50 mcg daily on empty stomach - TSH  3. DEPRESSION/ANXIETY Continue on duloxetine, mirtazapine, clonazepam, buspirone and Rexulti -Continue to follow-up with psychiatry services  4. Paroxysmal A-fib (HCC) Heart rate control -Continue on Eliquis no signs of bleeding reported - CBC with Differential/Platelet - CMP with eGFR([facility])  Family/ staff Communication: Reviewed plan of care with patient and care giver verbalized understanding   Labs/tests ordered:  - CBC with Differential/Platelet - CMP with eGFR([facility]) - TSH  Next Appointment : Return in about 6 months (around 05/28/2023) for medical mangement of chronic issues.Sandrea Hughs, NP

## 2022-11-28 LAB — CBC WITH DIFFERENTIAL/PLATELET
Absolute Monocytes: 330 cells/uL (ref 200–950)
Basophils Absolute: 48 cells/uL (ref 0–200)
Basophils Relative: 0.8 %
Eosinophils Absolute: 150 cells/uL (ref 15–500)
Eosinophils Relative: 2.5 %
HCT: 34.7 % — ABNORMAL LOW (ref 35.0–45.0)
Hemoglobin: 10.9 g/dL — ABNORMAL LOW (ref 11.7–15.5)
Lymphs Abs: 1254 cells/uL (ref 850–3900)
MCH: 25.2 pg — ABNORMAL LOW (ref 27.0–33.0)
MCHC: 31.4 g/dL — ABNORMAL LOW (ref 32.0–36.0)
MCV: 80.3 fL (ref 80.0–100.0)
MPV: 10 fL (ref 7.5–12.5)
Monocytes Relative: 5.5 %
Neutro Abs: 4218 cells/uL (ref 1500–7800)
Neutrophils Relative %: 70.3 %
Platelets: 306 10*3/uL (ref 140–400)
RBC: 4.32 10*6/uL (ref 3.80–5.10)
RDW: 14.6 % (ref 11.0–15.0)
Total Lymphocyte: 20.9 %
WBC: 6 10*3/uL (ref 3.8–10.8)

## 2022-11-28 LAB — COMPLETE METABOLIC PANEL WITH GFR
AG Ratio: 1.5 (calc) (ref 1.0–2.5)
ALT: 6 U/L (ref 6–29)
AST: 12 U/L (ref 10–35)
Albumin: 3.8 g/dL (ref 3.6–5.1)
Alkaline phosphatase (APISO): 61 U/L (ref 37–153)
BUN/Creatinine Ratio: 15 (calc) (ref 6–22)
BUN: 15 mg/dL (ref 7–25)
CO2: 24 mmol/L (ref 20–32)
Calcium: 9.2 mg/dL (ref 8.6–10.4)
Chloride: 105 mmol/L (ref 98–110)
Creat: 1 mg/dL — ABNORMAL HIGH (ref 0.60–0.95)
Globulin: 2.6 g/dL (calc) (ref 1.9–3.7)
Glucose, Bld: 110 mg/dL (ref 65–139)
Potassium: 4.6 mmol/L (ref 3.5–5.3)
Sodium: 142 mmol/L (ref 135–146)
Total Bilirubin: 0.3 mg/dL (ref 0.2–1.2)
Total Protein: 6.4 g/dL (ref 6.1–8.1)
eGFR: 55 mL/min/{1.73_m2} — ABNORMAL LOW (ref >=60)

## 2022-11-28 LAB — HEMOGLOBIN A1C
Hgb A1c MFr Bld: 5.2 % of total Hgb (ref <5.7)
Mean Plasma Glucose: 103 mg/dL
eAG (mmol/L): 5.7 mmol/L

## 2022-11-28 LAB — TSH: TSH: 1.41 mIU/L (ref 0.40–4.50)

## 2022-12-05 ENCOUNTER — Ambulatory Visit (INDEPENDENT_AMBULATORY_CARE_PROVIDER_SITE_OTHER): Payer: Medicare Other | Admitting: Family

## 2022-12-05 ENCOUNTER — Encounter: Payer: Self-pay | Admitting: Family

## 2022-12-05 VITALS — BP 108/64 | HR 80 | Temp 97.4°F | Resp 16 | Ht 62.0 in

## 2022-12-05 DIAGNOSIS — R1033 Periumbilical pain: Secondary | ICD-10-CM | POA: Diagnosis not present

## 2022-12-05 NOTE — Progress Notes (Signed)
Provider: Marlowe Sax FNP-C  Karl Erway, Nelda Bucks, NP  Patient Care Team: Bellamarie Pflug, Nelda Bucks, NP as PCP - General (Family Medicine) Tanda Rockers, MD as Consulting Physician (Pulmonary Disease) Deveron Furlong, NP as Nurse Practitioner (Psychiatry)  Extended Emergency Contact Information Primary Emergency Contact: Coltrane,Roberta(Bobbi) Address: [address]          [place], [place] [address] [name] Phone: [phone] Mobile Phone: [phone] Relation: Daughter Secondary Emergency Contact: Vanscyoc,David Address: [address]           [place], [place] [address] [place] Phone: [phone] Work Phone: [phone] Relation: Son  Code Status:  DNR Goals of care: Advanced Directive information    12/05/2022    9:54 AM  Advanced Directives  Does Patient Have a Medical Advance Directive? Yes  Type of Advance Directive Out of facility DNR (pink MOST or yellow form)  Does patient want to make changes to medical advance directive? No - Patient declined     Chief Complaint  Patient presents with   Acute Visit    Patient complains of abdominal pain after eating.     HPI:  Pt is a 88 y.o. female seen today for an acute visit for evaluation of abdominal pain after eating.Pain last up to 30 minutes after eating. Pain worst with lunch and dinner but no issues with breakfast. Bowels moving without any constipation.No nausea or vomiting. Also denies any blood in the stool.Daughter gave betopismol and heating pad helped.Abd was rumbling on saturday.No bloating  States her sister was recently diagnosed with a abdominal tumor which worries patient.    Past Medical History:  Diagnosis Date   A-fib ([place])    Anemia    Anxiety    Arthritis    hands, spine    Bladder incontinence    Blood transfusion    Cancer ([facility]) 02/2019   left breast cancer   Cervicalgia    Constipation    Cough    Wert-onset 08/2009, as of 2014- resolved    Deep  venous thrombosis ([place])    post op, rec'd/needed  blood thinner    Depression    Depression with anxiety    Dysrhythmia    a-fib   Falls frequently    pt. reports that she was falling 3 times a day, has had PT at a facility for a while & now is getting home PT 2-3 times/ week    Headache(784.0) 09/27/2011   History of stress test    15-20 yrs.ago    Hypotension    Hypothyroidism    Lumbar radiculopathy 09/26/2011   Pneumonia    never been in hosp. for pneumonia    Prediabetes    Right hand fracture    Monday   Shortness of breath    Sleep apnea    does not use consistently   Past Surgical History:  Procedure Laterality Date   ANTERIOR CERVICAL DECOMP/DISCECTOMY FUSION N/A 07/09/2013   Procedure: ANTERIOR CERVICAL DECOMPRESSION/DISCECTOMY FUSION 3 LEVELS;  Surgeon: Sinclair Ship, MD;  Location: [facility];  Service: Orthopedics;  Laterality: N/A;  Anterior cervical decompression fusion, cervical 3-4, cervical 4-5-, cervical 5-6 with instrumentation, allograft.   APPENDECTOMY     BACK SURGERY     2000   BREAST EXCISIONAL BIOPSY     breast bxs on left x 2, breast bx of right x 1-all benign   EYE SURGERY     cataracts bilateral /w IOL   HERNIA  REPAIR     umbilical hernia- [id number]   IR IMAGING GUIDED PORT INSERTION  04/28/2019   JOINT REPLACEMENT  2006,2007   bilateral   LAPAROSCOPIC OVARIAN CYSTECTOMY     not done by laparscopy-abdominal incision   LEG SURGERY     ORIF PERIPROSTHETIC FRACTURE Right 07/26/2019   Procedure: OPEN REDUCTION INTERNAL FIXATION (ORIF) RIGHT FEMUR PERIPROSTHETIC FRACTURE;  Surgeon: Rod Can, MD;  Location: [facility];  Service: Orthopedics;  Laterality: Right;   PORTACATH PLACEMENT Right 04/01/2019   Procedure: ATTEMPTED INSERTION PORT-A-CATH WITH ULTRASOUND;  Surgeon: [name], MD;  Location: [facility];  Service: General;  Laterality: Right;   RADIOACTIVE SEED GUIDED PARTIAL MASTECTOMY WITH AXILLARY SENTINEL LYMPH NODE BIOPSY  Left 04/01/2019   Procedure: LEFT BREAST PARTIAL MASTECTOMY WITH RADIOACTIVE SEED AND LEFT SENTINEL LYMPH NODE BIOPSY;  Surgeon: [name], MD;  Location: [facility];  Service: General;  Laterality: Left;   RE-EXCISION OF BREAST CANCER,SUPERIOR MARGINS Left 04/15/2019   Procedure: RE-EXCISION OF LEFT BREAST CANCER POSITIVE MARGINS;  Surgeon: [name], MD;  Location: [facility];  Service: General;  Laterality: Left;   REPLACEMENT TOTAL KNEE BILATERAL     TONSILLECTOMY      Allergies  Allergen Reactions   Clindamycin Hcl Other (See Comments)    REACTION: swelling, pt. Reports that she had a 16 lb. weightgain in one day    Outpatient Encounter Medications as of 12/05/2022  Medication Sig   albuterol (VENTOLIN HFA) 108 (90 Base) MCG/ACT inhaler Inhale 2 puffs into the lungs every 6 (six) hours as needed for wheezing or shortness of breath.   brexpiprazole (REXULTI) 2 MG TABS tablet Take 2 mg by mouth at bedtime.   busPIRone (BUSPAR) 5 MG tablet Take 5-10 mg by mouth 2 (two) times daily. 10 MG IN THE MORNING, 5 MG AT DINNER   CALCIUM CITRATE PO Take 600 mg by mouth daily.    clonazePAM (KLONOPIN) 0.5 MG tablet Take 0.25 mg by mouth as needed for anxiety.   D-Mannose 500 MG CAPS Take by mouth daily.   donepezil (ARICEPT) 10 MG tablet Take 1 tablet (10 mg total) by mouth at bedtime. Take 1  tablet at 10 mg daily   DULoxetine (CYMBALTA) 30 MG capsule Take 90 mg by mouth daily.   ELIQUIS 5 MG TABS tablet TAKE 1 TABLET BY MOUTH TWICE A DAY   furosemide (LASIX) 20 MG tablet TAKE 1 TABLET BY MOUTH EVERY DAY   KLOR-CON M20 20 MEQ tablet TAKE 1 TABLET BY MOUTH EVERY DAY   levothyroxine (SYNTHROID) 50 MCG tablet TAKE 1 TABLET BY MOUTH DAILY BEFORE BREAKFAST. DX E03.9   mirtazapine (REMERON) 15 MG tablet Take 7.5 mg by mouth at bedtime.   Multiple Vitamins-Minerals (MULTIVITAMIN PO) Take 1 tablet by mouth daily.    oxybutynin (DITROPAN-XL) 10 MG 24 hr tablet  Take 10 mg by mouth daily.   Probiotic Product (PROBIOTIC DAILY PO) Take 1 tablet by mouth daily. OTC   senna-docusate (SENOKOT-S) 8.6-50 MG tablet Take 1 tablet by mouth 2 (two) times daily as needed for mild constipation.   traZODone (DESYREL) 100 MG tablet Take 100 mg by mouth at bedtime.   trimethoprim (TRIMPEX) 100 MG tablet Take 100 mg by mouth at bedtime.   vitamin B-12 (CYANOCOBALAMIN) 500 MCG tablet Take 500 mcg by mouth daily.   Vitamin D, Ergocalciferol, (DRISDOL) 1.25 MG (50000 UNIT) CAPS capsule Take 50,000 Units by mouth once a week.   No facility-administered  encounter medications on file as of 12/05/2022.    Review of Systems  Constitutional:  Negative for appetite change, chills, fatigue, fever and unexpected weight change.  Eyes:  Negative for pain, discharge, redness, itching and visual disturbance.  Respiratory:  Negative for cough, chest tightness, shortness of breath and wheezing.   Cardiovascular:  Negative for chest pain, palpitations and leg swelling.  Gastrointestinal:  Negative for abdominal distention, blood in stool, constipation, diarrhea, nausea and vomiting.       Abdominal pain 30 minutes after lunch and dinner but not breakfast  Genitourinary:  Negative for difficulty urinating, dysuria, flank pain, hematuria and urgency.  Musculoskeletal:  Positive for arthralgias and gait problem. Negative for back pain.  Hematological:  Does not bruise/bleed easily.  Psychiatric/Behavioral:  Negative for agitation, behavioral problems, confusion, hallucinations and sleep disturbance. The patient is not nervous/anxious.     Immunization History  Administered Date(s) Administered   Fluad Quad(high Dose 65+) 07/22/2020, 09/29/2022   Influenza Split 08/14/2013   Influenza Whole 08/20/2009   Influenza, High Dose Seasonal PF 09/11/2017, 07/25/2018, 09/14/2021   Influenza,inj,Quad PF,6+ Mos 08/04/2014, 09/06/2015, 07/10/2016   PFIZER Comirnaty(Gray Top)Covid-19 Tri-Sucrose  Vaccine 02/23/2021   PFIZER(Purple Top)SARS-COV-2 Vaccination 12/27/2019, 01/17/2020, 08/30/2020, 09/14/2021   Pneumococcal Conjugate-13 01/14/2015   Pneumococcal Polysaccharide-23 07/11/2013   Td 11/20/2005, 11/27/2016   Pertinent  Health Maintenance Due  Topic Date Due   OPHTHALMOLOGY EXAM  02/16/2021   FOOT EXAM  07/22/2021   HEMOGLOBIN A1C  05/28/2023   INFLUENZA VACCINE  Completed   DEXA SCAN  Completed      09/30/2022    8:00 AM 10/06/2022   11:06 AM 10/19/2022    3:28 PM 11/27/2022    9:35 AM 12/05/2022    9:54 AM  Fall Risk  Falls in the past year?  '1 1 1 '$ 0  Was there an injury with Fall?  1 0 1 0  Fall Risk Category Calculator  '2 2 2 '$ 0  Fall Risk Category (Retired)  Moderate Moderate Moderate   (RETIRED) Patient Fall Risk Level High fall risk Moderate fall risk High fall risk Moderate fall risk   Patient at Risk for Falls Due to  History of fall(s);Impaired balance/gait;Impaired mobility  History of fall(s);Impaired balance/gait;Impaired mobility No Fall Risks  Fall risk Follow up  Falls evaluation completed;Education provided;Falls prevention discussed Falls evaluation completed Falls evaluation completed;Education provided;Falls prevention discussed Falls evaluation completed   Functional Status Survey:    Vitals:   12/05/22 1024  BP: 108/64  Pulse: 80  Resp: 16  Temp: (!) 97.4 F (36.3 C)  SpO2: 97%  Height: '5\' 2"'$  (1.575 m)   Body mass index is 36.29 kg/m. Physical Exam Vitals reviewed.  Constitutional:      General: She is not in acute distress.    Appearance: Normal appearance. She is normal weight. She is not ill-appearing or diaphoretic.  HENT:     Head: Normocephalic.  Eyes:     General: No scleral icterus.       Right eye: No discharge.        Left eye: No discharge.     Conjunctiva/sclera: Conjunctivae normal.     Pupils: Pupils are equal, round, and reactive to light.  Neck:     Vascular: No carotid bruit.  Cardiovascular:     Rate and  Rhythm: Normal rate and regular rhythm.     Pulses: Normal pulses.     Heart sounds: Normal heart sounds. No murmur heard.    No  friction rub. No gallop.  Pulmonary:     Effort: Pulmonary effort is normal. No respiratory distress.     Breath sounds: Normal breath sounds. No wheezing, rhonchi or rales.  Chest:     Chest wall: No tenderness.  Abdominal:     General: Bowel sounds are normal. There is no distension.     Palpations: Abdomen is soft. There is no mass.     Tenderness: There is abdominal tenderness in the periumbilical area. There is no right CVA tenderness, left CVA tenderness, guarding or rebound. Negative signs include Murphy's sign.  Musculoskeletal:        General: No swelling or tenderness. Normal range of motion.     Cervical back: Normal range of motion. No rigidity or tenderness.     Right lower leg: No edema.     Left lower leg: No edema.     Comments: On wheelchair during visit   Lymphadenopathy:     Cervical: No cervical adenopathy.  Skin:    General: Skin is warm and dry.     Coloration: Skin is not pale.     Findings: No bruising, erythema, lesion or rash.  Neurological:     Mental Status: She is alert and oriented to person, place, and time.     Motor: No weakness.     Gait: Gait abnormal.  Psychiatric:        Mood and Affect: Mood normal.        Speech: Speech normal.        Behavior: Behavior normal.     Labs reviewed: Recent Labs    09/27/22 1309 10/06/22 1132 11/27/22 1027  NA 144 141 142  K 4.4 4.5 4.6  CL 110 105 105  CO2 '26 29 24  '$ GLUCOSE 113* 105* 110  BUN '23 22 15  '$ CREATININE 0.95 0.95 1.00*  CALCIUM 9.6 8.9 9.2   Recent Labs    05/25/22 0944 09/27/22 1309 11/27/22 1027  AST '16 19 12  '$ ALT '9 14 6  '$ ALKPHOS  --  67  --   BILITOT 0.4 0.6 0.3  PROT 6.4 6.7 6.4  ALBUMIN  --  3.3*  --    Recent Labs    09/27/22 1309 10/06/22 1132 11/27/22 1027  WBC 9.7 6.5 6.0  NEUTROABS 7.2 4,817 4,218  HGB 9.9* 9.7* 10.9*  HCT 33.3*  31.3* 34.7*  MCV 81.6 79.2* 80.3  PLT 280 337 306   Lab Results  Component Value Date   TSH 1.41 11/27/2022   Lab Results  Component Value Date   HGBA1C 5.2 11/27/2022   Lab Results  Component Value Date   CHOL 204 (H) 05/25/2022   HDL 59 05/25/2022   LDLCALC 122 (H) 05/25/2022   TRIG 115 05/25/2022   CHOLHDL 3.5 05/25/2022    Significant Diagnostic Results in last 30 days:  No results found.  Assessment/Plan  Periumbilical abdominal pain Pain worst 30 minutes after eating lunch and dinner but none with breakfast. Periumbilical tenderness on palpation.  Will obtain abdominal ultrasound to rule out other acute abnormalities.  - US Abdomen Complete; Future  Family/ staff Communication: Reviewed plan of care with patient verbalized understanding  Labs/tests ordered: - US Abdomen Complete; Future  Next Appointment: Return if symptoms worsen or fail to improve.   Sandrea Hughs, NP

## 2022-12-07 ENCOUNTER — Other Ambulatory Visit: Payer: Self-pay

## 2022-12-07 DIAGNOSIS — D649 Anemia, unspecified: Secondary | ICD-10-CM

## 2022-12-12 ENCOUNTER — Other Ambulatory Visit: Payer: Self-pay

## 2022-12-12 DIAGNOSIS — D649 Anemia, unspecified: Secondary | ICD-10-CM | POA: Diagnosis not present

## 2022-12-13 ENCOUNTER — Ambulatory Visit
Admission: RE | Admit: 2022-12-13 | Discharge: 2022-12-13 | Disposition: A | Discharge status: Home / Self Care | Payer: Medicare Other | Source: Ambulatory Visit | Attending: Family | Admitting: Family

## 2022-12-13 ENCOUNTER — Other Ambulatory Visit: Payer: Self-pay

## 2022-12-13 DIAGNOSIS — K7689 Other specified diseases of liver: Secondary | ICD-10-CM | POA: Diagnosis not present

## 2022-12-13 DIAGNOSIS — R1033 Periumbilical pain: Secondary | ICD-10-CM

## 2022-12-13 DIAGNOSIS — D649 Anemia, unspecified: Secondary | ICD-10-CM

## 2022-12-13 LAB — FECAL GLOBIN BY IMMUNOCHEMISTRY
FECAL GLOBIN RESULT:: DETECTED — AB
MICRO NUMBER:: 14461171
SPECIMEN QUALITY:: ADEQUATE

## 2022-12-14 ENCOUNTER — Other Ambulatory Visit: Payer: Self-pay | Admitting: Family

## 2022-12-14 DIAGNOSIS — R195 Other fecal abnormalities: Secondary | ICD-10-CM

## 2022-12-19 ENCOUNTER — Encounter: Payer: Self-pay | Admitting: Nurse Practitioner

## 2022-12-19 ENCOUNTER — Other Ambulatory Visit (INDEPENDENT_AMBULATORY_CARE_PROVIDER_SITE_OTHER): Payer: Medicare Other

## 2022-12-19 ENCOUNTER — Ambulatory Visit (INDEPENDENT_AMBULATORY_CARE_PROVIDER_SITE_OTHER): Payer: Medicare Other | Admitting: Nurse Practitioner

## 2022-12-19 VITALS — BP 104/70 | HR 67 | Ht 62.0 in | Wt 200.0 lb

## 2022-12-19 DIAGNOSIS — R195 Other fecal abnormalities: Secondary | ICD-10-CM

## 2022-12-19 DIAGNOSIS — D649 Anemia, unspecified: Secondary | ICD-10-CM

## 2022-12-19 LAB — CBC
HCT: 34.3 % — ABNORMAL LOW (ref 36.0–46.0)
Hemoglobin: 11 g/dL — ABNORMAL LOW (ref 12.0–15.0)
MCHC: 32.1 g/dL (ref 30.0–36.0)
MCV: 78.8 fl (ref 78.0–100.0)
Platelets: 302 10*3/uL (ref 150.0–400.0)
RBC: 4.35 Mil/uL (ref 3.87–5.11)
RDW: 17.1 % — ABNORMAL HIGH (ref 11.5–15.5)
WBC: 6.8 10*3/uL (ref 4.0–10.5)

## 2022-12-19 NOTE — Patient Instructions (Addendum)
If you are age 86 or older, your body mass index should be between 23-30. Your Body mass index is 36.58 kg/m. If this is out of the aforementioned range listed, please consider follow up with your Primary Care Provider. ________________________________________________________  The [facility] providers would like to encourage you to use [facility] to communicate with providers for non-urgent requests or questions.  Due to long hold times on the telephone, sending your provider a message by [facility] may be a faster and more efficient way to get a response.  Please allow 48 business hours for a response.  Please remember that this is for non-urgent requests.  _______________________________________________________  Your provider has requested that you go to the basement level for lab work before leaving today. Press "B" on the elevator. The lab is located at the first door on the left as you exit the elevator.  Due to recent changes in healthcare laws, you may see the results of your imaging and laboratory studies on MyChart before your provider has had a chance to review them.  We understand that in some cases there may be results that are confusing or concerning to you. Not all laboratory results come back in the same time frame and the provider may be waiting for multiple results in order to interpret others.  Please give [place] 48 hours in order for your provider to thoroughly review all the results before contacting the office for clarification of your results.   Hold oral iron. Also, no pepto bismuth.  Monitor stool color over next couple of weeks. Call Berniece Salines, RN at [phone] in two weeks with an update. If stool remains black off iron then we will need to discuss EGD.  Thank you for entrusting me with your care and choosing [facility]  Tye Savoy, NP

## 2022-12-19 NOTE — Progress Notes (Addendum)
Assessment    Patient profile:  Andrea [name] is a 88 y.o. female known to Dr. Carlean Purl with a past medical history of HTN, hypothyroidism, OSA,  Afib on Eliquis, DM, dementia, anxiety / depression, . See PMH /PSH for additional history.  Patient is referred by PCP for positive fecal occult blood test  # Chronic anemia, borderline microcytic. Dark stool on iron but has tested positive for occult blood. She takes Eliquis. Some drift in hgb back in November from 11.2 to 9.7 but hgb back near 11 on repeat labs 11/27/22.  Patient is at increased risk for procedure given age and co-morbidities and this was discussed with her daughter. A positive hemoccult may or may not represent a clinically significant GI lesion.   # Afib, on Eliquis  # HTN  # Dementia   Plan   Follow up CBC today.  Check iron studies keeping in mind that she has been on replacement.  I have asked daughter to hold oral iron for a couple of weeks to see if stools remain black .  I will also repeat CBC in a couple of weeks to make sure hgb is stable. It sounds like in the meantime she will be seeing Cardiology to see if Eliquis can be discontinued.  Depending on clinical course and how invasive daughter wants she could need an EGD at Hospital at some point.   ------------------------------------------------------------  GI Attending  She is iron deficient We will have her see me in clinic to review situation and determine next steps.    Andrea Mayer, MD, [facility]  HPI    Chief complaint:  none at present. Abdominal pain resolved.    Andrea [name] was last seen here in March 2021. Here with daughter and caregiver for evaluation of FOBT+.  She started having postprandial periumbilical and mid lower abdominal pain a couple of weeks ago. Pain was unrelieved with defecation and wasn't associated with any bowel habit changes. She takes a Mg+/ Ca+ supplement. She was started on a Mg supplement for restless legs. Daughter  thought the additional Mg+ may have caused the abdominal pain . She stopped the Mg+ supplement and pain resolved. She had been to see her PCP about the pain. An abdominal US showed several simple cysts identified within the liver, possibly fatty liver but nothing acute. No gallbladder abnormalities.   Per daughter, patient's appetite is good, weight is stable.   Patient has taken oral iron every other day for years. Her stools are normally dark but recently tested positive for blood.  She has chronic anemia. Baseline hgb has been around 11.2, it declined to 9.7 in November but back up to 10.9 on repeat labs 11/27/22.    Labs:     Latest Ref Rng & Units 11/27/2022   10:27 AM 10/06/2022   11:32 AM 09/27/2022    1:09 PM  CBC  WBC 3.8 - 10.8 Thousand/uL 6.0  6.5  9.7   Hemoglobin 11.7 - 15.5 g/dL 10.9  9.7  9.9   Hematocrit 35.0 - 45.0 % 34.7  31.3  33.3   Platelets 140 - 400 Thousand/uL 306  337  280        Latest Ref Rng & Units 11/27/2022   10:27 AM 09/27/2022    1:09 PM 05/25/2022    9:44 AM  Hepatic Function  Total Protein 6.1 - 8.1 g/dL 6.4  6.7  6.4   Albumin 3.5 - 5.0 g/dL  3.3    AST  10 - 35 U/L '12  19  16   '$ ALT 6 - 29 U/L '6  14  9   '$ Alk Phosphatase 38 - 126 U/L  67    Total Bilirubin 0.2 - 1.2 mg/dL 0.3  0.6  0.4      Past Medical History:  Diagnosis Date   A-fib ([facility])    Anemia    Anxiety    Arthritis    hands, spine    Bladder incontinence    Blood transfusion    Cancer ([place]) 02/2019   left breast cancer   Cervicalgia    Constipation    Cough    Wert-onset 08/2009, as of 2014- resolved    Deep venous thrombosis ([place])    post op, rec'd/needed  blood thinner    Dementia ([place])    Depression    Depression with anxiety    Dysrhythmia    a-fib   Falls frequently    pt. reports that she was falling 3 times a day, has had PT at a facility for a while & now is getting home PT 2-3 times/ week    Headache(784.0) 09/27/2011   History of stress test    15-20 yrs.ago     Hypotension    Hypothyroidism    Lumbar radiculopathy 09/26/2011   Pneumonia    never been in hosp. for pneumonia    Prediabetes    Right hand fracture    Monday   Shortness of breath    Sleep apnea    does not use consistently    Past Surgical History:  Procedure Laterality Date   ANTERIOR CERVICAL DECOMP/DISCECTOMY FUSION N/A 07/09/2013   Procedure: ANTERIOR CERVICAL DECOMPRESSION/DISCECTOMY FUSION 3 LEVELS;  Surgeon: Sinclair Ship, MD;  Location: [facility];  Service: Orthopedics;  Laterality: N/A;  Anterior cervical decompression fusion, cervical 3-4, cervical 4-5-, cervical 5-6 with instrumentation, allograft.   APPENDECTOMY     BACK SURGERY     2000   BREAST EXCISIONAL BIOPSY     breast bxs on left x 2, breast bx of right x 1-all benign   EYE SURGERY     cataracts bilateral /w IOL   HERNIA REPAIR     umbilical hernia- [id number]   IR IMAGING GUIDED PORT INSERTION  04/28/2019   JOINT REPLACEMENT  2006,2007   bilateral   LAPAROSCOPIC OVARIAN CYSTECTOMY     not done by laparscopy-abdominal incision   LEG SURGERY     ORIF PERIPROSTHETIC FRACTURE Right 07/26/2019   Procedure: OPEN REDUCTION INTERNAL FIXATION (ORIF) RIGHT FEMUR PERIPROSTHETIC FRACTURE;  Surgeon: Rod Can, MD;  Location: [facility];  Service: Orthopedics;  Laterality: Right;   PORTACATH PLACEMENT Right 04/01/2019   Procedure: ATTEMPTED INSERTION PORT-A-CATH WITH ULTRASOUND;  Surgeon: [name], MD;  Location: [facility];  Service: General;  Laterality: Right;   RADIOACTIVE SEED GUIDED PARTIAL MASTECTOMY WITH AXILLARY SENTINEL LYMPH NODE BIOPSY Left 04/01/2019   Procedure: LEFT BREAST PARTIAL MASTECTOMY WITH RADIOACTIVE SEED AND LEFT SENTINEL LYMPH NODE BIOPSY;  Surgeon: [name], MD;  Location: [facility];  Service: General;  Laterality: Left;   RE-EXCISION OF BREAST CANCER,SUPERIOR MARGINS Left 04/15/2019   Procedure: RE-EXCISION OF LEFT BREAST CANCER POSITIVE MARGINS;   Surgeon: [name], MD;  Location: [facility];  Service: General;  Laterality: Left;   REPLACEMENT TOTAL KNEE BILATERAL     TONSILLECTOMY      Current Medications, Allergies, Family History and Social History were reviewed in [employer]  medical record.     Current Outpatient Medications  Medication Sig Dispense Refill   albuterol (VENTOLIN HFA) 108 (90 Base) MCG/ACT inhaler Inhale 2 puffs into the lungs every 6 (six) hours as needed for wheezing or shortness of breath. 18 g 11   brexpiprazole (REXULTI) 2 MG TABS tablet Take 2 mg by mouth at bedtime.     busPIRone (BUSPAR) 5 MG tablet Take 5-10 mg by mouth 2 (two) times daily. 10 MG IN THE MORNING, 5 MG AT DINNER     CALCIUM CITRATE PO Take 600 mg by mouth daily.      clonazePAM (KLONOPIN) 0.5 MG tablet Take 0.25 mg by mouth as needed for anxiety.     D-Mannose 500 MG CAPS Take by mouth daily.     donepezil (ARICEPT) 10 MG tablet Take 1 tablet (10 mg total) by mouth at bedtime. Take 1  tablet at 10 mg daily 30 tablet 11   DULoxetine (CYMBALTA) 30 MG capsule Take 90 mg by mouth daily.     ELIQUIS 5 MG TABS tablet TAKE 1 TABLET BY MOUTH TWICE A DAY 60 tablet 5   furosemide (LASIX) 20 MG tablet TAKE 1 TABLET BY MOUTH EVERY DAY 90 tablet 1   KLOR-CON M20 20 MEQ tablet TAKE 1 TABLET BY MOUTH EVERY DAY 90 tablet 1   levothyroxine (SYNTHROID) 50 MCG tablet TAKE 1 TABLET BY MOUTH DAILY BEFORE BREAKFAST. DX E03.9 90 tablet 2   mirtazapine (REMERON) 15 MG tablet Take 7.5 mg by mouth at bedtime.  2   Multiple Vitamins-Minerals (MULTIVITAMIN PO) Take 1 tablet by mouth daily.      oxybutynin (DITROPAN-XL) 10 MG 24 hr tablet Take 10 mg by mouth daily.     Probiotic Product (PROBIOTIC DAILY PO) Take 1 tablet by mouth daily. OTC     senna-docusate (SENOKOT-S) 8.6-50 MG tablet Take 1 tablet by mouth 2 (two) times daily as needed for mild constipation.     traZODone (DESYREL) 100 MG tablet Take 100 mg by mouth at  bedtime.     trimethoprim (TRIMPEX) 100 MG tablet Take 100 mg by mouth at bedtime.     vitamin B-12 (CYANOCOBALAMIN) 500 MCG tablet Take 500 mcg by mouth daily.     Vitamin D, Ergocalciferol, (DRISDOL) 1.25 MG (50000 UNIT) CAPS capsule Take 50,000 Units by mouth once a week.     No current facility-administered medications for this visit.    Review of Systems: No chest pain. No shortness of breath. No urinary complaints.    Physical Exam  Wt Readings from Last 3 Encounters:  12/19/22 200 lb (90.7 kg)  11/27/22 198 lb 6.4 oz (90 kg)  10/06/22 176 lb 12.8 oz (80.2 kg)    BP 104/70   Pulse 67   Ht '5\' 2"'$  (1.575 m)   Wt 200 lb (90.7 kg)   BMI 36.58 kg/m  Constitutional:  Obese female in wheelchair in no acute distress. Psychiatric: Quiet. Sleepy.  EENT: Pupils normal.  Conjunctivae are normal. No scleral icterus. Neck supple.  Cardiovascular: Normal rate Pulmonary/chest: Effort normal and breath sounds normal. No wheezing, rales or rhonchi. Abdominal: Limited exam in wheelchair. Abdomen is soft, nondistended, nontender. Bowel sounds active throughout.  Extremities: trace BLE edema  I spent 30 minutes total reviewing records, obtaining history, performing exam, counseling patient and documenting visit / findings.    Tye Savoy, NP  12/19/2022, 11:16 AM  Cc:  Ngetich, Nelda Bucks, NP

## 2022-12-20 LAB — IRON,TIBC AND FERRITIN PANEL
%SAT: 13 % (calc) — ABNORMAL LOW (ref 16–45)
Ferritin: 13 ng/mL — ABNORMAL LOW (ref 16–288)
Iron: 47 ug/dL (ref 45–160)
TIBC: 355 mcg/dL (calc) (ref 250–450)

## 2022-12-27 ENCOUNTER — Telehealth: Payer: Self-pay

## 2022-12-27 NOTE — Telephone Encounter (Signed)
Pt daughter made aware of Dr. Carlean Purl recommendations: Notified to restart iron.  Pt was scheduled for an office visit with Dr. Carlean Purl on 01/05/2023 at 11:30 AM. Tammi Klippel made aware. Bobbi verbalized understanding with all questions answered.

## 2022-12-27 NOTE — Telephone Encounter (Signed)
RE: Need to discuss Received: Iverson Alamin, MD  Willia Craze, NP; Gillermina Hu, RN We will have her come see me  Remo Lipps,  Please arrange a f/u me in an 1130 or 350 slot in next couple of weeks to review things  Have her restart ferrous sulfate also  CEG

## 2023-01-05 ENCOUNTER — Ambulatory Visit (INDEPENDENT_AMBULATORY_CARE_PROVIDER_SITE_OTHER): Payer: Medicare Other | Admitting: Internal Medicine

## 2023-01-05 ENCOUNTER — Encounter: Payer: Self-pay | Admitting: Internal Medicine

## 2023-01-05 VITALS — BP 110/76 | HR 65 | Ht 62.0 in

## 2023-01-05 DIAGNOSIS — Z7901 Long term (current) use of anticoagulants: Secondary | ICD-10-CM | POA: Diagnosis not present

## 2023-01-05 DIAGNOSIS — F01518 Vascular dementia, unspecified severity, with other behavioral disturbance: Secondary | ICD-10-CM

## 2023-01-05 DIAGNOSIS — R195 Other fecal abnormalities: Secondary | ICD-10-CM | POA: Diagnosis not present

## 2023-01-05 DIAGNOSIS — D509 Iron deficiency anemia, unspecified: Secondary | ICD-10-CM

## 2023-01-05 NOTE — Progress Notes (Signed)
Andrea [name] 88 y.o. 10-12-1935 [id number]  Assessment & Plan:   Encounter Diagnoses  Name Primary?   Positive fecal occult blood test Yes   Iron deficiency anemia, unspecified iron deficiency anemia type    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 ([place])    Long term current use of anticoagulant    Given her age and significant comorbidities we have decided not to pursue any endoscopic evaluation.  Daughter is aware that the patient could have a gastrointestinal malignancy and is accepting of this and has decided not to pursue colonoscopy or EGD.  Patient can [facility] though she is demented.  Additional considerations would be consider parenteral iron infusion depending upon clinical course with p.o. iron.  Also I think it would be prudent to consider stopping Eliquis in somebody this frail and with iron deficiency anemia and possible chronic blood loss..  Defer to cardiology.   Subjective:   Chief Complaint: Heme positive stool iron deficiency anemia  HPI 88 year old demented white woman here with family member (daughter) and caretaker because of Hemoccult positive stool and iron deficiency anemia.  She saw Tye Savoy on December 19, 2022.  The patient has had anemia she has been on iron therapy for a number of years (see that note) and had stool testing that showed iFOBT positive.  She does not have abdominal pain or bowel habit changes at this time.  She had some dark stools but had been on iron.  She was instructed to stop iron and the daughter did so in the stool became brown.  At 1 point she was taking some calcium and magnesium supplements and was complaining of abdominal pain and that the timing of those and the types were adjusted and she is not having abdominal pain after eating anymore.  She cannot really participate in history due to her dementia.  She takes Eliquis because of a history of A-fib there is also history of DVT. Allergies   Allergen Reactions   Clindamycin Hcl Other (See Comments)    REACTION: swelling, pt. Reports that she had a 16 lb. weightgain in one day   Current Meds  Medication Sig   brexpiprazole (REXULTI) 2 MG TABS tablet Take 2 mg by mouth at bedtime.   busPIRone (BUSPAR) 5 MG tablet Take 5-10 mg by mouth 2 (two) times daily. 10 MG IN THE MORNING, 5 MG AT DINNER   CALCIUM CITRATE PO Take 600 mg by mouth daily.    clonazePAM (KLONOPIN) 0.5 MG tablet Take 0.25 mg by mouth as needed for anxiety.   D-Mannose 500 MG CAPS Take by mouth daily.   donepezil (ARICEPT) 10 MG tablet Take 1 tablet (10 mg total) by mouth at bedtime. Take 1  tablet at 10 mg daily   DULoxetine (CYMBALTA) 30 MG capsule Take 90 mg by mouth daily.   ELIQUIS 5 MG TABS tablet TAKE 1 TABLET BY MOUTH TWICE A DAY   furosemide (LASIX) 20 MG tablet TAKE 1 TABLET BY MOUTH EVERY DAY   KLOR-CON M20 20 MEQ tablet TAKE 1 TABLET BY MOUTH EVERY DAY   levothyroxine (SYNTHROID) 50 MCG tablet TAKE 1 TABLET BY MOUTH DAILY BEFORE BREAKFAST. DX E03.9   mirtazapine (REMERON) 15 MG tablet Take 7.5 mg by mouth at bedtime.   Multiple Vitamins-Minerals (MULTIVITAMIN PO) Take 1 tablet by mouth daily.    oxybutynin (DITROPAN-XL) 10 MG 24 hr tablet Take 10 mg by mouth daily.   Probiotic Product (PROBIOTIC DAILY PO) Take 1 tablet  by mouth daily. OTC   senna-docusate (SENOKOT-S) 8.6-50 MG tablet Take 1 tablet by mouth 2 (two) times daily as needed for mild constipation.   traZODone (DESYREL) 100 MG tablet Take 100 mg by mouth at bedtime.   trimethoprim (TRIMPEX) 100 MG tablet Take 100 mg by mouth at bedtime.   vitamin B-12 (CYANOCOBALAMIN) 500 MCG tablet Take 500 mcg by mouth daily.   Vitamin D, Ergocalciferol, (DRISDOL) 1.25 MG (50000 UNIT) CAPS capsule Take 50,000 Units by mouth once a week.   Past Medical History:  Diagnosis Date   A-fib ([place])    Anemia    Anxiety    Arthritis    hands, spine    Bladder incontinence    Blood transfusion    Cancer  ([place]) 02/2019   left breast cancer   Cervicalgia    Constipation    Cough    [place]ert-onset 08/2009, as of 2014- resolved    Deep venous thrombosis ([place])    post op, rec'd/needed  blood thinner    Dementia ([place])    Depression    Depression with anxiety    Dysrhythmia    a-fib   Falls frequently    pt. reports that she was falling 3 times a day, has had PT at a facility for a while & now is getting home PT 2-3 times/ week    Headache(784.0) 09/27/2011   History of stress test    15-20 yrs.ago    Hypotension    Hypothyroidism    Lumbar radiculopathy 09/26/2011   Pneumonia    never been in hosp. for pneumonia    Prediabetes    Right hand fracture    Monday   Shortness of breath    Sleep apnea    does not use consistently   Past Surgical History:  Procedure Laterality Date   ANTERIOR CERVICAL DECOMP/DISCECTOMY FUSION N/A 07/09/2013   Procedure: ANTERIOR CERVICAL DECOMPRESSION/DISCECTOMY FUSION 3 LEVELS;  Surgeon: Sinclair Ship, MD;  Location: [facility];  Service: Orthopedics;  Laterality: N/A;  Anterior cervical decompression fusion, cervical 3-4, cervical 4-5-, cervical 5-6 with instrumentation, allograft.   APPENDECTOMY     BACK SURGERY     2000   BREAST EXCISIONAL BIOPSY     breast bxs on left x 2, breast bx of right x 1-all benign   EYE SURGERY     cataracts bilateral /w IOL   HERNIA REPAIR     umbilical hernia- [id number]   IR IMAGING GUIDED PORT INSERTION  04/28/2019   JOINT REPLACEMENT  2006,2007   bilateral   LAPAROSCOPIC OVARIAN CYSTECTOMY     not done by laparscopy-abdominal incision   LEG SURGERY     ORIF PERIPROSTHETIC FRACTURE Right 07/26/2019   Procedure: OPEN REDUCTION INTERNAL FIXATION (ORIF) RIGHT FEMUR PERIPROSTHETIC FRACTURE;  Surgeon: Rod Can, MD;  Location: [facility];  Service: Orthopedics;  Laterality: Right;   PORTACATH PLACEMENT Right 04/01/2019   Procedure: ATTEMPTED INSERTION PORT-A-CATH WITH ULTRASOUND;  Surgeon: [name], MD;  Location:  [place];  Service: General;  Laterality: Right;   RADIOACTIVE SEED GUIDED PARTIAL MASTECTOMY WITH AXILLARY SENTINEL LYMPH NODE BIOPSY Left 04/01/2019   Procedure: LEFT BREAST PARTIAL MASTECTOMY WITH RADIOACTIVE SEED AND LEFT SENTINEL LYMPH NODE BIOPSY;  Surgeon: [name], MD;  Location: [facility] [place];  Service: General;  Laterality: Left;   RE-EXCISION OF BREAST CANCER,SUPERIOR MARGINS Left 04/15/2019   Procedure: RE-EXCISION OF LEFT BREAST CANCER POSITIVE MARGINS;  Surgeon: [name], MD;  Location: [facility];  Service: General;  Laterality: Left;   REPLACEMENT TOTAL KNEE BILATERAL     TONSILLECTOMY     Social History   Social History Narrative   Patient is single and lives alone.   Patient is retired.   Patient has a college education.   Patient has three adult children   Patient is right-handed.   Patient drinks 3-4 cups of caffeine (soda and coffee) daily.   Walks with walker   family history includes Anxiety disorder in her daughter; Arthritis in her father; Cancer in her maternal aunt; Deep vein thrombosis in her son; Depression in her son; Diabetes in her daughter and son; Heart disease in her father; Hyperlipidemia in her son; Hypertension in her daughter, son, and son; Malignant hyperthermia in her father; Obesity in her son; Parkinson's disease in her mother.   Review of Systems  As per HPI Objective:   Physical Exam BP 110/76   Pulse 65   Ht 5' 2"$  (1.575 m)   BMI 36.58 kg/m  Elderly ww in W/C - flat affect and limited communication Does respond to direct ? From daughter  "You don't want a colonoscopy, do you? - No"

## 2023-01-05 NOTE — Patient Instructions (Signed)
Continue your iron per Dr Carlean Purl.  Dr Carlean Purl is going to message your PCP Dr about the possibly of IV iron.  DON'T DO THE LABS ON MONDAY.  I appreciate the opportunity to care for you. Silvano Rusk, MD, [facility]

## 2023-01-08 ENCOUNTER — Other Ambulatory Visit: Payer: Medicare Other

## 2023-01-11 ENCOUNTER — Telehealth: Payer: Self-pay | Admitting: *Deleted

## 2023-01-11 ENCOUNTER — Other Ambulatory Visit: Payer: Self-pay | Admitting: Family

## 2023-01-11 DIAGNOSIS — R0602 Shortness of breath: Secondary | ICD-10-CM

## 2023-01-11 NOTE — Telephone Encounter (Signed)
Called daughter, Jolayne Haines, and patient has not seen a Film/video editor in years. Daughter scheduled an appointment to come to office 01/23/2023

## 2023-01-11 NOTE — Telephone Encounter (Signed)
-----   Message from Sandrea Hughs, NP sent at 01/07/2023 10:15 PM EST ----- Regarding: RE: FYI Dr.Gessener, Thank you for the update on Ms,[email or web address].will have our staff contact her daughter to schedule a cardiologist appointment to evaluate Eliquis due to heme positive.will also offer iron infusion referral if desired. Thank You  Dinah Ngetich,FNP-C  ----- Message ----- From: Gatha Mayer, MD Sent: 01/05/2023   5:21 PM EST To: Sandrea Hughs, NP Subject: Devoria Glassing,  Given her comorbidities and age we are not pursuing an endoscopic evaluation for heme positive iron deficiency anemia.  Daughter is aware that the patient could have a GI malignancy and accepts this risk.   She had labs to be done next week but we had repeated her iron studies so I told them they did not need to do those.  I guess you could consider a parenteral iron infusion for her that would boost her iron stores since even though she is taking p.o. iron they stay on the low side.  Her hemoglobin is almost normal however.  Question DC Eliquis given her comorbidities and fall risk.  It Yahel Fuston be exacerbating bleeding as well.  They mentioned a cardiology appointment was coming but I do not think there is one scheduled.   Best regards,  Glendell Docker

## 2023-01-17 ENCOUNTER — Ambulatory Visit (INDEPENDENT_AMBULATORY_CARE_PROVIDER_SITE_OTHER): Payer: Medicare Other | Admitting: Family

## 2023-01-17 ENCOUNTER — Encounter: Payer: Self-pay | Admitting: Family

## 2023-01-17 VITALS — Ht 62.0 in | Wt 200.0 lb

## 2023-01-17 DIAGNOSIS — Z0000 Encounter for general adult medical examination without abnormal findings: Secondary | ICD-10-CM

## 2023-01-17 NOTE — Progress Notes (Signed)
This service is provided via telemedicine  No vital signs collected/recorded due to the encounter was a telemedicine visit.   Location of patient (ex: home, work):  home  Patient consents to a telephone visit:  yes (POA)  Location of the provider (ex: office, home):  [facility]  Names of all persons participating in the telemedicine service and their role in the encounter:  patients daughter, Earl Gala [facility], Marlowe Sax NP  Time spent on call:  74mn     Subjective:   Andrea Amatucciis a 88y.o. female who presents for Medicare Annual (Subsequent) preventive examination.  Review of Systems     Cardiac Risk Factors include: advanced age (>552m, >6>82omen);obesity (BMI >30kg/m2);smoking/ tobacco exposure;sedentary lifestyle     Objective:    Today's Vitals   01/17/23 0932 01/17/23 1008  Weight: 200 lb (90.7 kg)   Height: '5\' 2"'$  (1.575 m)   PainSc:  5    Body mass index is 36.58 kg/m.     01/17/2023    9:35 AM 12/05/2022    9:54 AM 11/27/2022    9:35 AM 10/19/2022    3:28 PM 10/06/2022   11:06 AM 09/27/2022    3:48 PM 07/25/2022   11:16 AM  Advanced Directives  Does Patient Have a Medical Advance Directive? Yes Yes Yes Yes Yes Yes No  Type of Advance Directive Out of facility DNR (pink MOST or yellow form) Out of facility DNR (pink MOST or yellow form) Out of facility DNR (pink MOST or yellow form)  Out of facility DNR (pink MOST or yellow form) Out of facility DNR (pink MOST or yellow form)   Does patient want to make changes to medical advance directive? No - Patient declined No - Patient declined No - Patient declined  No - Patient declined No - Patient declined     Current Medications (verified) Outpatient Encounter Medications as of 01/17/2023  Medication Sig   brexpiprazole (REXULTI) 2 MG TABS tablet Take 2 mg by mouth at bedtime.   busPIRone (BUSPAR) 5 MG tablet Take 5-10 mg by mouth 2 (two) times daily. 10 MG IN THE  MORNING, 5 MG AT DINNER   CALCIUM CITRATE PO Take 600 mg by mouth daily.    clonazePAM (KLONOPIN) 0.5 MG tablet Take 0.25 mg by mouth as needed for anxiety.   D-Mannose 500 MG CAPS Take by mouth daily.   donepezil (ARICEPT) 10 MG tablet Take 1 tablet (10 mg total) by mouth at bedtime. Take 1  tablet at 10 mg daily   DULoxetine (CYMBALTA) 30 MG capsule Take 90 mg by mouth daily.   ELIQUIS 5 MG TABS tablet TAKE 1 TABLET BY MOUTH TWICE A DAY   furosemide (LASIX) 20 MG tablet TAKE 1 TABLET BY MOUTH EVERY DAY   KLOR-CON M20 20 MEQ tablet TAKE 1 TABLET BY MOUTH EVERY DAY   levothyroxine (SYNTHROID) 50 MCG tablet TAKE 1 TABLET BY MOUTH DAILY BEFORE BREAKFAST. DX E03.9   mirtazapine (REMERON) 15 MG tablet Take 7.5 mg by mouth at bedtime.   Multiple Vitamins-Minerals (MULTIVITAMIN PO) Take 1 tablet by mouth daily.    oxybutynin (DITROPAN-XL) 10 MG 24 hr tablet Take 10 mg by mouth daily.   Probiotic Product (PROBIOTIC DAILY PO) Take 1 tablet by mouth daily. OTC   senna-docusate (SENOKOT-S) 8.6-50 MG tablet Take 1 tablet by mouth 2 (two) times daily as needed for mild constipation.   traMADol (ULTRAM) 50 MG tablet Take 50 mg  by mouth every 6 (six) hours as needed.   traZODone (DESYREL) 100 MG tablet Take 100 mg by mouth at bedtime.   trimethoprim (TRIMPEX) 100 MG tablet Take 100 mg by mouth at bedtime.   vitamin B-12 (CYANOCOBALAMIN) 500 MCG tablet Take 500 mcg by mouth daily.   Vitamin D, Ergocalciferol, (DRISDOL) 1.25 MG (50000 UNIT) CAPS capsule Take 50,000 Units by mouth once a week.   [DISCONTINUED] albuterol (VENTOLIN HFA) 108 (90 Base) MCG/ACT inhaler Inhale 2 puffs into the lungs every 6 (six) hours as needed for wheezing or shortness of breath. (Patient not taking: Reported on 01/05/2023)   No facility-administered encounter medications on file as of 01/17/2023.    Allergies (verified) Clindamycin hcl   History: Past Medical History:  Diagnosis Date   A-fib ([place])    Anemia    Anxiety     Arthritis    hands, spine    Bladder incontinence    Blood transfusion    Cancer ([place]) 02/2019   left breast cancer   Cervicalgia    Constipation    Cough    [place]-onset 08/2009, as of 2014- resolved    Deep venous thrombosis ([place])    post op, rec'd/needed  blood thinner    Dementia ([place])    Depression    Depression with anxiety    Dysrhythmia    a-fib   Falls frequently    pt. reports that she was falling 3 times a day, has had PT at a facility for a while & now is getting home PT 2-3 times/ week    Headache(784.0) 09/27/2011   History of stress test    15-20 yrs.ago    Hypotension    Hypothyroidism    Lumbar radiculopathy 09/26/2011   Pneumonia    never been in hosp. for pneumonia    Prediabetes    Right hand fracture    Monday   Shortness of breath    Sleep apnea    does not use consistently   Past Surgical History:  Procedure Laterality Date   ANTERIOR CERVICAL DECOMP/DISCECTOMY FUSION N/A 07/09/2013   Procedure: ANTERIOR CERVICAL DECOMPRESSION/DISCECTOMY FUSION 3 LEVELS;  Surgeon: Sinclair Ship, MD;  Location: [facility];  Service: Orthopedics;  Laterality: N/A;  Anterior cervical decompression fusion, cervical 3-4, cervical 4-5-, cervical 5-6 with instrumentation, allograft.   APPENDECTOMY     BACK SURGERY     2000   BREAST EXCISIONAL BIOPSY     breast bxs on left x 2, breast bx of right x 1-all benign   EYE SURGERY     cataracts bilateral /w IOL   HERNIA REPAIR     umbilical hernia- [id number]   IR IMAGING GUIDED PORT INSERTION  04/28/2019   JOINT REPLACEMENT  2006,2007   bilateral   LAPAROSCOPIC OVARIAN CYSTECTOMY     not done by laparscopy-abdominal incision   LEG SURGERY     ORIF PERIPROSTHETIC FRACTURE Right 07/26/2019   Procedure: OPEN REDUCTION INTERNAL FIXATION (ORIF) RIGHT FEMUR PERIPROSTHETIC FRACTURE;  Surgeon: Rod Can, MD;  Location: [place] [facility];  Service: Orthopedics;  Laterality: Right;   PORTACATH PLACEMENT Right 04/01/2019   Procedure: ATTEMPTED  INSERTION PORT-A-CATH WITH ULTRASOUND;  Surgeon: [name], MD;  Location: [place];  Service: General;  Laterality: Right;   RADIOACTIVE SEED GUIDED PARTIAL MASTECTOMY WITH AXILLARY SENTINEL LYMPH NODE BIOPSY Left 04/01/2019   Procedure: LEFT BREAST PARTIAL MASTECTOMY WITH RADIOACTIVE SEED AND LEFT SENTINEL LYMPH NODE BIOPSY;  Surgeon: [name], MD;  Location: [facility];  Service: General;  Laterality: Left;   RE-EXCISION OF BREAST CANCER,SUPERIOR MARGINS Left 04/15/2019   Procedure: RE-EXCISION OF LEFT BREAST CANCER POSITIVE MARGINS;  Surgeon: [name], MD;  Location: [facility];  Service: General;  Laterality: Left;   REPLACEMENT TOTAL KNEE BILATERAL     TONSILLECTOMY     Family History  Problem Relation Age of Onset   Heart disease Father    Malignant hyperthermia Father    Arthritis Father    Deep vein thrombosis Son    Diabetes Son    Obesity Son    Depression Son    Hypertension Son    Parkinson's disease Mother    Diabetes Daughter    Hypertension Daughter    Anxiety disorder Daughter    Hyperlipidemia Son    Hypertension Son    Cancer Maternal Aunt    Social History   Socioeconomic History   Marital status: Single    Spouse name: Not on file   Number of children: 3   Years of education: College   Highest education level: Not on file  Occupational History   Occupation: Retired Therapist, sports  Tobacco Use   Smoking status: Former    Packs/day: 0.25    Years: 20.00    Total pack years: 5.00    Types: Cigarettes    Quit date: 01/18/2021    Years since quitting: 1.9   Smokeless tobacco: Never   Tobacco comments:    1-2 cigarettes a day  Vaping Use   Vaping Use: Never used  Substance and Sexual Activity   Alcohol use: Not Currently    Comment: Glass of wine 1-2 times a year at Christmas   Drug use: No   Sexual activity: Never  Other Topics Concern   Not on file  Social History Narrative   Patient is  single and lives alone.   Patient is retired.   Patient has a college education.   Patient has three adult children   Patient is right-handed.   Patient drinks 3-4 cups of caffeine (soda and coffee) daily.   Walks with walker   Social Determinants of Health   Financial Resource Strain: Low Risk  (12/03/2017)   Overall Financial Resource Strain (CARDIA)    Difficulty of Paying Living Expenses: Not very hard  Food Insecurity: No Food Insecurity (10/02/2022)   Hunger Vital Sign    Worried About Running Out of Food in the Last Year: Never true    Ran Out of Food in the Last Year: Never true  Transportation Needs: No Transportation Needs (10/02/2022)   PRAPARE - Hydrologist (Medical): No    Lack of Transportation (Non-Medical): No  Physical Activity: Inactive (12/03/2017)   Exercise Vital Sign    Days of Exercise per Week: 0 days    Minutes of Exercise per Session: 0 min  Stress: No Stress Concern Present (12/03/2017)   [place]    Feeling of Stress : Only a little  Social Connections: Moderately Integrated (12/03/2017)   Social Connection and Isolation Panel [NHANES]    Frequency of Communication with Friends and Family: More than three times a week    Frequency of Social Gatherings with Friends and Family: More than three times a week    Attends Religious Services: More than 4 times per year    Active Member of [employer] or Organizations: Yes    Attends Archivist Meetings:  Never    Marital Status: Divorced    Tobacco Counseling Counseling given: Not Answered Tobacco comments: 1-2 cigarettes a day   Clinical Intake:  Pre-visit preparation completed: No  Pain : 0-10 Pain Score: 5  Pain Type: Chronic pain Pain Location: Knee Pain Orientation: Right Pain Radiating Towards: No Pain Descriptors / Indicators: Aching Pain Frequency: Intermittent Pain Relieving Factors:  Tylenoll as needed Effect of Pain on Daily Activities: walking  Pain Relieving Factors: Tylenoll as needed  BMI - recorded: 36.29 Diabetes: No  How often do you need to have someone help you when you read instructions, pamphlets, or other written materials from your doctor or pharmacy?: 5 - Always What is the last grade level you completed in school?: Nursing degreee  Diabetic?No   Interpreter Needed?: No      Activities of Daily Living    01/17/2023   10:20 AM 09/27/2022    6:09 PM  In your present state of health, do you have any difficulty performing the following activities:  Hearing? 1   Comment hearing aids   Vision? 1   Comment wears eye glasses   Difficulty concentrating or making decisions? 1   Comment memory loss   Walking or climbing stairs? 1   Comment walks with a walk   Dressing or bathing? 1   Doing errands, shopping? 1 1  Comment daughter Doctor, hospital and eating ? Y   Comment daughter assist   Using the Toilet? N   In the past six months, have you accidently leaked urine? Y   Comment incontinent   Do you have problems with loss of bowel control? Y   Comment miralax effective   Managing your Medications? N   Comment daughter assist   Managing your Finances? Y   Housekeeping or managing your Housekeeping? N     Patient Care Team: Anjanette Gilkey, Nelda Bucks, NP as PCP - General (Family Medicine) Tanda Rockers, MD as Consulting Physician (Pulmonary Disease) Deveron Furlong, NP as Nurse Practitioner (Psychiatry)  Indicate any recent Medical Services you may have received from other than [facility] providers in the past year (date may be approximate).     Assessment:   This is a routine wellness examination for [facility].  Hearing/Vision screen No results found.  Dietary issues and exercise activities discussed: Current Exercise Habits: The patient does not participate in regular exercise at present, Exercise limited by: orthopedic condition(s) (knee  pain)   Goals Addressed             This Visit's Progress    Patient Stated   Not on track    To improve walking       Depression Screen    01/17/2023    9:34 AM 05/25/2022   10:15 AM 02/20/2022   10:22 AM 12/27/2021   11:05 AM 12/23/2020   10:50 AM 11/25/2020   11:42 AM 07/22/2020   10:54 AM  PHQ 2/9 Scores  PHQ - 2 Score 1 0 3 0 0 0 0  PHQ- 9 Score 1  9      Exception Documentation Other- indicate reason in comment box        Not completed annual wellness visit          Fall Risk    12/05/2022    9:54 AM 11/27/2022    9:35 AM 10/19/2022    3:28 PM 10/06/2022   11:06 AM 07/25/2022   11:16 AM  Fall Risk   Falls in the  past year? 0 '1 1 1 '$ 0  Number falls in past yr: 0 0 1 0 0  Injury with Fall? 0 1 0 1 0  Risk for fall due to : No Fall Risks History of fall(s);Impaired balance/gait;Impaired mobility  History of fall(s);Impaired balance/gait;Impaired mobility   Follow up Falls evaluation completed Falls evaluation completed;Education provided;Falls prevention discussed Falls evaluation completed Falls evaluation completed;Education provided;Falls prevention discussed     FALL RISK PREVENTION PERTAINING TO THE HOME:  Any stairs in or around the home? No  If so, are there any without handrails? No  Home free of loose throw rugs in walkways, pet beds, electrical cords, etc? No  Adequate lighting in your home to reduce risk of falls? Yes   ASSISTIVE DEVICES UTILIZED TO PREVENT FALLS:  Life alert? No  Use of a cane, walker or w/c? Yes  Grab bars in the bathroom? No  Shower chair or bench in shower? No  Elevated toilet seat or a handicapped toilet? Yes   TIMED UP AND GO:  Was the test performed? No .  Length of time to ambulate 10 feet: N/A  sec.   Gait unsteady without use of assistive device, provider informed and interventions were implemented  Cognitive Function:    09/29/2021    3:52 PM 12/10/2018    1:20 PM 12/03/2017    8:57 AM 11/27/2016    8:51 AM 03/05/2014     2:10 PM  MMSE - Mini Mental State Exam  Orientation to time '1 5 5 5 5  '$ Orientation to Place '2 4 4 5 5  '$ Registration '2 3 3 3 3  '$ Attention/ Calculation 0 '4 5 5 5  '$ Recall '2 2 1 3 3  '$ Language- name 2 objects '2 2 2 2 2  '$ Language- repeat '1 1 1 1 1  '$ Language- follow 3 step command '2 2 3 3 3  '$ Language- read & follow direction '1 1 1 1 1  '$ Write a sentence 0 '1 1 1 1  '$ Copy design 0 0 '1 1 1  '$ Total score '13 25 27 30 30        '$ 01/17/2023    9:36 AM 12/23/2020   10:54 AM  6CIT Screen  What Year? 4 points 0 points  What month? 3 points 0 points  What time? 3 points 0 points  Count back from 20 4 points 4 points  Months in reverse 4 points 4 points  Repeat phrase 10 points 10 points  Total Score 28 points 18 points    Immunizations Immunization History  Administered Date(s) Administered   Fluad Quad(high Dose 65+) 07/22/2020, 09/29/2022   Influenza Split 08/14/2013   Influenza Whole 08/20/2009   Influenza, High Dose Seasonal PF 09/11/2017, 07/25/2018, 09/14/2021   Influenza,inj,Quad PF,6+ Mos 08/04/2014, 09/06/2015, 07/10/2016   PFIZER Comirnaty(Gray Top)Covid-19 Tri-Sucrose Vaccine 02/23/2021   PFIZER(Purple Top)SARS-COV-2 Vaccination 12/27/2019, 01/17/2020, 08/30/2020, 09/14/2021   Pneumococcal Conjugate-13 01/14/2015   Pneumococcal Polysaccharide-23 07/11/2013   Td 11/20/2005, 11/27/2016    TDAP status: Up to date  Flu Vaccine status: Up to date  Pneumococcal vaccine status: Up to date  Covid-19 vaccine status: Information provided on how to obtain vaccines.   Qualifies for Shingles Vaccine? Yes   Zostavax completed No   Shingrix Completed?: No.    Education has been provided regarding the importance of this vaccine. Patient has been advised to call insurance company to determine out of pocket expense if they have not yet received this vaccine. Advised may also receive vaccine  at local pharmacy or Health Dept. Verbalized acceptance and understanding.  Screening Tests Health  Maintenance  Topic Date Due   Zoster Vaccines- Shingrix (1 of 2) Never done   OPHTHALMOLOGY EXAM  02/16/2021   FOOT EXAM  07/22/2021   COVID-19 Vaccine (6 - 2023-24 season) 07/21/2022   Diabetic kidney evaluation - Urine ACR  11/24/2022   HEMOGLOBIN A1C  05/28/2023   Diabetic kidney evaluation - eGFR measurement  11/28/2023   Medicare Annual Wellness (AWV)  01/18/2024   DTaP/Tdap/Td (3 - Tdap) 11/27/2026   Pneumonia Vaccine 44+ Years old  Completed   INFLUENZA VACCINE  Completed   DEXA SCAN  Completed   HPV VACCINES  Aged Out    Health Maintenance  Health Maintenance Due  Topic Date Due   Zoster Vaccines- Shingrix (1 of 2) Never done   OPHTHALMOLOGY EXAM  02/16/2021   FOOT EXAM  07/22/2021   COVID-19 Vaccine (6 - 2023-24 season) 07/21/2022   Diabetic kidney evaluation - Urine ACR  11/24/2022    Colorectal cancer screening: No longer required.   Mammogram status: No longer required due to advance age .  Bone Density status: Completed 06/06/2021. Results reflect: Bone density results: OSTEOPENIA. Repeat every 2 years.  Lung Cancer Screening: (Low Dose CT Chest recommended if Age 22-80 years, 30 pack-year currently smoking OR have quit w/in 15years.) does not qualify.   Lung Cancer Screening Referral: No   Additional Screening:  Hepatitis C Screening: does not qualify; Completed No   Vision Screening: Recommended annual ophthalmology exams for early detection of glaucoma and other disorders of the eye. Is the patient up to date with their annual eye exam?  No  Who is the provider or what is the name of the office in which the patient attends annual eye exams? [facility]  If pt is not established with a provider, would they like to be referred to a provider to establish care? No .   Dental Screening: Recommended annual dental exams for proper oral hygiene  Community Resource Referral / Chronic Care Management: CRR required this visit?  No   CCM required this visit?   No      Plan:     I have personally reviewed and noted the following in the patient's chart:   Medical and social history Use of alcohol, tobacco or illicit drugs  Current medications and supplements including opioid prescriptions. Patient is currently taking opioid prescriptions. Information provided to patient regarding non-opioid alternatives. Patient advised to discuss non-opioid treatment plan with their provider. Functional ability and status Nutritional status Physical activity Advanced directives List of other physicians Hospitalizations, surgeries, and ER visits in previous 12 months Vitals Screenings to include cognitive, depression, and falls Referrals and appointments  In addition, I have reviewed and discussed with patient certain preventive protocols, quality metrics, and best practice recommendations. A written personalized care plan for preventive services as well as general preventive health recommendations were provided to patient.    Sandrea Hughs, NP   01/17/2023   Nurse Notes: POA will schedule Opthalmology and Podiatry appointment

## 2023-01-18 DIAGNOSIS — F332 Major depressive disorder, recurrent severe without psychotic features: Secondary | ICD-10-CM | POA: Diagnosis not present

## 2023-01-18 DIAGNOSIS — F341 Dysthymic disorder: Secondary | ICD-10-CM | POA: Diagnosis not present

## 2023-01-18 DIAGNOSIS — F411 Generalized anxiety disorder: Secondary | ICD-10-CM | POA: Diagnosis not present

## 2023-01-21 NOTE — Patient Instructions (Addendum)
Ms. Andrea [name] , Thank you for taking time to come for your Medicare Wellness Visit. I appreciate your ongoing commitment to your health goals. Please review the following plan we discussed and let me know if I can assist you in the future.   Screening recommendations/referrals: Colonoscopy : N/A Mammogram : Up to date  Bone Density : Up to date  Recommended yearly ophthalmology/optometry visit for glaucoma screening and checkup Recommended yearly dental visit for hygiene and checkup  Vaccinations: Influenza vaccine- due annually in September/October Pneumococcal vaccine : Up to date  Tdap vaccine : Up to date  Shingles vaccine: Due     Advanced directives: Yes   Conditions/risks identified: advanced age (>61mn, >>64women);obesity (BMI >30kg/m2);smoking/ tobacco exposure;sedentary lifestyle  Next appointment: 1 year   Preventive Care 616Years and Older, Female Preventive care refers to lifestyle choices and visits with your health care provider that can promote health and wellness. What does preventive care include? A yearly physical exam. This is also called an annual well check. Dental exams once or twice a year. Routine eye exams. Ask your health care provider how often you should have your eyes checked. Personal lifestyle choices, including: Daily care of your teeth and gums. Regular physical activity. Eating a healthy diet. Avoiding tobacco and drug use. Limiting alcohol use. Practicing safe sex. Taking low-dose aspirin every day. Taking vitamin and mineral supplements as recommended by your health care provider. What happens during an annual well check? The services and screenings done by your health care provider during your annual well check will depend on your age, overall health, lifestyle risk factors, and family history of disease. Counseling  Your health care provider may ask you questions about your: Alcohol use. Tobacco use. Drug use. Emotional well-being. Home  and relationship well-being. Sexual activity. Eating habits. History of falls. Memory and ability to understand (cognition). Work and work eStatistician Reproductive health. Screening  You may have the following tests or measurements: Height, weight, and BMI. Blood pressure. Lipid and cholesterol levels. These may be checked every 5 years, or more frequently if you are over 558years old. Skin check. Lung cancer screening. You may have this screening every year starting at age 73089if you have a 30-pack-year history of smoking and currently smoke or have quit within the past 15 years. Fecal occult blood test (FOBT) of the stool. You may have this test every year starting at age 73077 Flexible sigmoidoscopy or colonoscopy. You may have a sigmoidoscopy every 5 years or a colonoscopy every 10 years starting at age 73063 Hepatitis C blood test. Hepatitis B blood test. Sexually transmitted disease (STD) testing. Diabetes screening. This is done by checking your blood sugar (glucose) after you have not eaten for a while (fasting). You may have this done every 1-3 years. Bone density scan. This is done to screen for osteoporosis. You may have this done starting at age 86 Mammogram. This may be done every 1-2 years. Talk to your health care provider about how often you should have regular mammograms. Talk with your health care provider about your test results, treatment options, and if necessary, the need for more tests. Vaccines  Your health care provider may recommend certain vaccines, such as: Influenza vaccine. This is recommended every year. Tetanus, diphtheria, and acellular pertussis (Tdap, Td) vaccine. You may need a Td booster every 10 years. Zoster vaccine. You may need this after age 86 Pneumococcal 13-valent conjugate (PCV13) vaccine. One dose is recommended after age 86 Pneumococcal polysaccharide (  PPSV23) vaccine. One dose is recommended after age 28. Talk to your health care provider  about which screenings and vaccines you need and how often you need them. This information is not intended to replace advice given to you by your health care provider. Make sure you discuss any questions you have with your health care provider. Document Released: 12/03/2015 Document Revised: 07/26/2016 Document Reviewed: 09/07/2015 Elsevier Interactive Patient Education  2017 [place] Prevention in the Home Falls can cause injuries. They can happen to people of all ages. There are many things you can do to make your home safe and to help prevent falls. What can I do on the outside of my home? Regularly fix the edges of walkways and driveways and fix any cracks. Remove anything that might make you trip as you walk through a door, such as a raised step or threshold. Trim any bushes or trees on the path to your home. Use bright outdoor lighting. Clear any walking paths of anything that might make someone trip, such as rocks or tools. Regularly check to see if handrails are loose or broken. Make sure that both sides of any steps have handrails. Any raised decks and porches should have guardrails on the edges. Have any leaves, snow, or ice cleared regularly. Use sand or salt on walking paths during winter. Clean up any spills in your garage right away. This includes oil or grease spills. What can I do in the bathroom? Use night lights. Install grab bars by the toilet and in the tub and shower. Do not use towel bars as grab bars. Use non-skid mats or decals in the tub or shower. If you need to sit down in the shower, use a plastic, non-slip stool. Keep the floor dry. Clean up any water that spills on the floor as soon as it happens. Remove soap buildup in the tub or shower regularly. Attach bath mats securely with double-sided non-slip rug tape. Do not have throw rugs and other things on the floor that can make you trip. What can I do in the bedroom? Use night lights. Make sure  that you have a light by your bed that is easy to reach. Do not use any sheets or blankets that are too big for your bed. They should not hang down onto the floor. Have a firm chair that has side arms. You can use this for support while you get dressed. Do not have throw rugs and other things on the floor that can make you trip. What can I do in the kitchen? Clean up any spills right away. Avoid walking on wet floors. Keep items that you use a lot in easy-to-reach places. If you need to reach something above you, use a strong step stool that has a grab bar. Keep electrical cords out of the way. Do not use floor polish or wax that makes floors slippery. If you must use wax, use non-skid floor wax. Do not have throw rugs and other things on the floor that can make you trip. What can I do with my stairs? Do not leave any items on the stairs. Make sure that there are handrails on both sides of the stairs and use them. Fix handrails that are broken or loose. Make sure that handrails are as long as the stairways. Check any carpeting to make sure that it is firmly attached to the stairs. Fix any carpet that is loose or worn. Avoid having throw rugs at the top or  bottom of the stairs. If you do have throw rugs, attach them to the floor with carpet tape. Make sure that you have a light switch at the top of the stairs and the bottom of the stairs. If you do not have them, ask someone to add them for you. What else can I do to help prevent falls? Wear shoes that: Do not have high heels. Have rubber bottoms. Are comfortable and fit you well. Are closed at the toe. Do not wear sandals. If you use a stepladder: Make sure that it is fully opened. Do not climb a closed stepladder. Make sure that both sides of the stepladder are locked into place. Ask someone to hold it for you, if possible. Clearly mark and make sure that you can see: Any grab bars or handrails. First and last steps. Where the edge of  each step is. Use tools that help you move around (mobility aids) if they are needed. These include: Canes. Walkers. Scooters. Crutches. Turn on the lights when you go into a dark area. Replace any light bulbs as soon as they burn out. Set up your furniture so you have a clear path. Avoid moving your furniture around. If any of your floors are uneven, fix them. If there are any pets around you, be aware of where they are. Review your medicines with your doctor. Some medicines can make you feel dizzy. This can increase your chance of falling. Ask your doctor what other things that you can do to help prevent falls. This information is not intended to replace advice given to you by your health care provider. Make sure you discuss any questions you have with your health care provider. Document Released: 09/02/2009 Document Revised: 04/13/2016 Document Reviewed: 12/11/2014 Elsevier Interactive Patient Education  2017 [employer].

## 2023-01-23 ENCOUNTER — Ambulatory Visit: Payer: Medicare Other | Admitting: Family

## 2023-01-23 ENCOUNTER — Ambulatory Visit: Payer: Medicare Other | Admitting: Physician Assistant

## 2023-02-13 DIAGNOSIS — I739 Peripheral vascular disease, unspecified: Secondary | ICD-10-CM | POA: Diagnosis not present

## 2023-02-13 DIAGNOSIS — L603 Nail dystrophy: Secondary | ICD-10-CM | POA: Diagnosis not present

## 2023-02-15 ENCOUNTER — Telehealth: Payer: Self-pay

## 2023-02-15 DIAGNOSIS — I480 Paroxysmal atrial fibrillation: Secondary | ICD-10-CM

## 2023-02-15 DIAGNOSIS — I825Y3 Chronic embolism and thrombosis of unspecified deep veins of proximal lower extremity, bilateral: Secondary | ICD-10-CM

## 2023-02-15 NOTE — Telephone Encounter (Signed)
Cardiology referral ordered.specialist office will call patient to schedule appointment.

## 2023-02-15 NOTE — Telephone Encounter (Signed)
The patient was expecting a referral to cardiology to see if she could stop the eliquis. Please advise and place referral f needed.

## 2023-02-17 ENCOUNTER — Other Ambulatory Visit: Payer: Self-pay | Admitting: Family

## 2023-02-17 DIAGNOSIS — Z79899 Other long term (current) drug therapy: Secondary | ICD-10-CM

## 2023-02-22 DIAGNOSIS — N3946 Mixed incontinence: Secondary | ICD-10-CM | POA: Diagnosis not present

## 2023-02-22 DIAGNOSIS — R350 Frequency of micturition: Secondary | ICD-10-CM | POA: Diagnosis not present

## 2023-03-15 DIAGNOSIS — F341 Dysthymic disorder: Secondary | ICD-10-CM | POA: Diagnosis not present

## 2023-03-15 DIAGNOSIS — F411 Generalized anxiety disorder: Secondary | ICD-10-CM | POA: Diagnosis not present

## 2023-03-15 DIAGNOSIS — F332 Major depressive disorder, recurrent severe without psychotic features: Secondary | ICD-10-CM | POA: Diagnosis not present

## 2023-03-27 ENCOUNTER — Other Ambulatory Visit: Payer: Self-pay | Admitting: Family

## 2023-03-27 DIAGNOSIS — E039 Hypothyroidism, unspecified: Secondary | ICD-10-CM

## 2023-04-01 ENCOUNTER — Other Ambulatory Visit: Payer: Self-pay | Admitting: Family

## 2023-04-01 DIAGNOSIS — Z7901 Long term (current) use of anticoagulants: Secondary | ICD-10-CM

## 2023-04-03 NOTE — Progress Notes (Unsigned)
Cardiology Office Note:    Date:  04/04/2023   ID:  Andrea [name], DOB 1935-11-20, MRN [id number]  PCP:  Caesar Bookman, NP   [facility] Providers Cardiologist:  Daniqua Campoy  Click to update primary MD,subspecialty MD or APP then REFRESH:1}    Referring MD: Ngetich, Donalee Citrin, NP   Chief Complaint  Patient presents with   Atrial Fibrillation         History of Present Illness:    Andrea [name] is a 88 y.o. female with a hx of PAF, chronic DVT, hx of breast cancer   Significant dementia   Seen with Andrea [name] ( daughter)  Has had PAF, hx of DVT .   No complaints of Afib ,  Has had some blood in her stool.  Was seen by Dr. Leone Payor .  No further episodes of anemia   Gets short of breath Is basically wheelchair bound  Is likely very deconditioned.   CHADS2VASC  2 ( Age 53, )  Hx of breast cancer  Still has her Right venous access port in place      Past Medical History:  Diagnosis Date   A-fib (HCC)    Anemia    Anxiety    Arthritis    hands, spine    Bladder incontinence    Blood transfusion    Cancer (HCC) 02/2019   left breast cancer   Cervicalgia    Constipation    Cough    Wert-onset 08/2009, as of 2014- resolved    Deep venous thrombosis (HCC)    post op, rec'd/needed  blood thinner    Dementia (HCC)    Depression    Depression with anxiety    Diabetes (HCC)    Dysrhythmia    a-fib   Falls frequently    pt. reports that she was falling 3 times a day, has had PT at a facility for a while & now is getting home PT 2-3 times/ week    Headache(784.0) 09/27/2011   History of stress test    15-20 yrs.ago    Hypotension    Hypothyroidism    Lumbar radiculopathy 09/26/2011   Pneumonia    never been in hosp. for pneumonia    Prediabetes    Right hand fracture    Monday   Shortness of breath    Sleep apnea    does not use consistently    Past Surgical History:  Procedure Laterality Date   ANTERIOR CERVICAL DECOMP/DISCECTOMY FUSION  N/A 07/09/2013   Procedure: ANTERIOR CERVICAL DECOMPRESSION/DISCECTOMY FUSION 3 LEVELS;  Surgeon: Emilee Hero, MD;  Location: [facility] OR;  Service: Orthopedics;  Laterality: N/A;  Anterior cervical decompression fusion, cervical 3-4, cervical 4-5-, cervical 5-6 with instrumentation, allograft.   APPENDECTOMY     BACK SURGERY     2000   BREAST EXCISIONAL BIOPSY     breast bxs on left x 2, breast bx of right x 1-all benign   EYE SURGERY     cataracts bilateral /w IOL   HERNIA REPAIR     umbilical hernia- 2004   IR IMAGING GUIDED PORT INSERTION  04/28/2019   JOINT REPLACEMENT  2006,2007   bilateral   LAPAROSCOPIC OVARIAN CYSTECTOMY     not done by laparscopy-abdominal incision   LEG SURGERY     ORIF PERIPROSTHETIC FRACTURE Right 07/26/2019   Procedure: OPEN REDUCTION INTERNAL FIXATION (ORIF) RIGHT FEMUR PERIPROSTHETIC FRACTURE;  Surgeon: Samson Frederic, MD;  Location: [facility] OR;  Service: Orthopedics;  Laterality: Right;   PORTACATH PLACEMENT Right 04/01/2019   Procedure: ATTEMPTED INSERTION PORT-A-CATH WITH ULTRASOUND;  Surgeon: Abigail Miyamoto, MD;  Location: [facility];  Service: General;  Laterality: Right;   RADIOACTIVE SEED GUIDED PARTIAL MASTECTOMY WITH AXILLARY SENTINEL LYMPH NODE BIOPSY Left 04/01/2019   Procedure: LEFT BREAST PARTIAL MASTECTOMY WITH RADIOACTIVE SEED AND LEFT SENTINEL LYMPH NODE BIOPSY;  Surgeon: Abigail Miyamoto, MD;  Location: [facility];  Service: General;  Laterality: Left;   RE-EXCISION OF BREAST CANCER,SUPERIOR MARGINS Left 04/15/2019   Procedure: RE-EXCISION OF LEFT BREAST CANCER POSITIVE MARGINS;  Surgeon: Abigail Miyamoto, MD;  Location: [facility];  Service: General;  Laterality: Left;   REPLACEMENT TOTAL KNEE BILATERAL     TONSILLECTOMY      Current Medications: Current Meds  Medication Sig   brexpiprazole (REXULTI) 2 MG TABS tablet Take 2 mg by mouth at bedtime.   busPIRone (BUSPAR) 5 MG tablet Take 5-10 mg  by mouth 2 (two) times daily. 10 MG IN THE MORNING, 5 MG AT DINNER   CALCIUM CITRATE PO Take 600 mg by mouth daily.    clonazePAM (KLONOPIN) 0.5 MG tablet Take 0.25 mg by mouth as needed for anxiety.   D-Mannose 500 MG CAPS Take by mouth daily.   donepezil (ARICEPT) 10 MG tablet Take 1 tablet (10 mg total) by mouth at bedtime. Take 1  tablet at 10 mg daily   DULoxetine (CYMBALTA) 30 MG capsule Take 90 mg by mouth daily.   ELIQUIS 5 MG TABS tablet TAKE 1 TABLET BY MOUTH TWICE A DAY   furosemide (LASIX) 20 MG tablet TAKE 1 TABLET BY MOUTH EVERY DAY   KLOR-CON M20 20 MEQ tablet TAKE 1 TABLET BY MOUTH EVERY DAY   levothyroxine (SYNTHROID) 50 MCG tablet TAKE 1 TABLET BY MOUTH DAILY BEFORE BREAKFAST.   mirtazapine (REMERON) 15 MG tablet Take 7.5 mg by mouth at bedtime.   Multiple Vitamins-Minerals (MULTIVITAMIN PO) Take 1 tablet by mouth daily.    oxybutynin (DITROPAN-XL) 10 MG 24 hr tablet Take 10 mg by mouth daily.   Probiotic Product (PROBIOTIC DAILY PO) Take 1 tablet by mouth daily. OTC   senna-docusate (SENOKOT-S) 8.6-50 MG tablet Take 1 tablet by mouth 2 (two) times daily as needed for mild constipation.   traMADol (ULTRAM) 50 MG tablet Take 50 mg by mouth every 6 (six) hours as needed.   traZODone (DESYREL) 100 MG tablet Take 100 mg by mouth at bedtime.   trimethoprim (TRIMPEX) 100 MG tablet Take 100 mg by mouth at bedtime.   vitamin B-12 (CYANOCOBALAMIN) 500 MCG tablet Take 500 mcg by mouth daily.   Vitamin D, Ergocalciferol, (DRISDOL) 1.25 MG (50000 UNIT) CAPS capsule Take 50,000 Units by mouth once a week.     Allergies:   Clindamycin hcl   Social History   Socioeconomic History   Marital status: Single    Spouse name: Not on file   Number of children: 3   Years of education: College   Highest education level: Not on file  Occupational History   Occupation: Retired Charity fundraiser  Tobacco Use   Smoking status: Former    Packs/day: 0.25    Years: 20.00    Additional pack years: 0.00     Total pack years: 5.00    Types: Cigarettes    Quit date: 01/18/2021    Years since quitting: 2.2   Smokeless tobacco: Never   Tobacco comments:    1-2 cigarettes a day  Vaping Use  Vaping Use: Never used  Substance and Sexual Activity   Alcohol use: Not Currently    Comment: Glass of wine 1-2 times a year at Christmas   Drug use: No   Sexual activity: Never  Other Topics Concern   Not on file  Social History Narrative   Patient is single and lives alone.   Patient is retired.   Patient has a college education.   Patient has three adult children   Patient is right-handed.   Patient drinks 3-4 cups of caffeine (soda and coffee) daily.   Walks with walker   Social Determinants of Health   Financial Resource Strain: Low Risk  (12/03/2017)   Overall Financial Resource Strain (CARDIA)    Difficulty of Paying Living Expenses: Not very hard  Food Insecurity: No Food Insecurity (10/02/2022)   Hunger Vital Sign    Worried About Running Out of Food in the Last Year: Never true    Ran Out of Food in the Last Year: Never true  Transportation Needs: No Transportation Needs (10/02/2022)   PRAPARE - Administrator, Civil Service (Medical): No    Lack of Transportation (Non-Medical): No  Physical Activity: Inactive (12/03/2017)   Exercise Vital Sign    Days of Exercise per Week: 0 days    Minutes of Exercise per Session: 0 min  Stress: No Stress Concern Present (12/03/2017)   [employer] of Occupational Health - Occupational Stress Questionnaire    Feeling of Stress : Only a little  Social Connections: Moderately Integrated (12/03/2017)   Social Connection and Isolation Panel [NHANES]    Frequency of Communication with Friends and Family: More than three times a week    Frequency of Social Gatherings with Friends and Family: More than three times a week    Attends Religious Services: More than 4 times per year    Active Member of [employer] or Organizations: Yes    Attends  Banker Meetings: Never    Marital Status: Divorced     Family History: The patient's family history includes Anxiety disorder in her daughter; Arthritis in her father; Cancer in her maternal aunt; Deep vein thrombosis in her son; Depression in her son; Diabetes in her daughter and son; Heart disease in her father; Hyperlipidemia in her son; Hypertension in her daughter, son, and son; Malignant hyperthermia in her father; Obesity in her son; Parkinson's disease in her mother.  ROS:   Please see the history of present illness.    All other systems reviewed and are negative.  EKGs/Labs/Other Studies Reviewed:    The following studies were reviewed today:   EKG:  09/30/22 :  NSR at 60 .  No ST or T wave changes.   Recent Labs: 11/27/2022: ALT 6; BUN 15; Creat 1.00; Potassium 4.6; Sodium 142; TSH 1.41 12/19/2022: Hemoglobin 11.0; Platelets 302.0  Recent Lipid Panel    Component Value Date/Time   CHOL 204 (H) 05/25/2022 0944   CHOL 171 04/24/2016 0928   TRIG 115 05/25/2022 0944   HDL 59 05/25/2022 0944   HDL 58 04/24/2016 0928   CHOLHDL 3.5 05/25/2022 0944   VLDL 28 03/01/2017 0820   LDLCALC 122 (H) 05/25/2022 0944     Risk Assessment/Calculations:    CHA2DS2-VASc Score = 3   This indicates a 3.2% annual risk of stroke. The patient's score is based upon: CHF History: 0 HTN History: 0 Diabetes History: 0 Stroke History: 0 Vascular Disease History: 0 Age Score: 2 Gender Score: 1  Physical Exam:    VS:  BP 138/78   Pulse (!) 59   Ht 5\' 2"  (1.575 m)   BMI 36.58 kg/m     Wt Readings from Last 3 Encounters:  01/17/23 200 lb (90.7 kg)  12/19/22 200 lb (90.7 kg)  11/27/22 198 lb 6.4 oz (90 kg)     GEN: elderly female, examined in wheelchair in no acute distress HEENT: Normal NECK: No JVD; No carotid bruits LYMPHATICS: No lymphadenopathy CARDIAC: RRR, no murmurs, rubs, gallops RESPIRATORY:  Clear to auscultation without rales, wheezing  or rhonchi  ABDOMEN: Soft, non-tender, non-distended MUSCULOSKELETAL:  No edema; No deformity  SKIN: Warm and dry NEUROLOGIC:  moderate dementia.   Flat affect .   Did not speak much duing the exam  PSYCHIATRIC:   flat affect ,    ASSESSMENT:    1. Chronic deep vein thrombosis (DVT) of proximal vein of both lower extremities (HCC)   2. Essential (primary) hypertension   3. Paroxysmal A-fib (HCC)    PLAN:       Paroxysmal atrial fibrillation: Ms. Seamon is seen for further evaluation of her paroxysmal atrial fibrillation.  She apparently has been seen this many years ago but I do not have any records of that.  She had 1 episode of some GI bleeding but this apparently has cleared. Her CHADS2VASC is 2 so I would recommend Eliquis or other anticoagulation lifelong as long as she is not having other complications.  At this point I do not see any reason that she should stop it.  I did explain to the daughter that if she were to develop significant bleeding that we probably would need to hold the Eliquis.  She is elderly, basically wheelchair-bound, has at least moderate dementia.  I do not think that she is a good candidate for a Watchman procedure.     2.  Hx of DVT:   the eliquis is also helping prevent the development of DVT.   She is very immobile and is at continued risk for DVT   Will see her as needed        Medication Adjustments/Labs and Tests Ordered: Current medicines are reviewed at length with the patient today.  Concerns regarding medicines are outlined above.  No orders of the defined types were placed in this encounter.  No orders of the defined types were placed in this encounter.   Patient Instructions  Medication Instructions:  Your physician recommends that you continue on your current medications as directed. Please refer to the Current Medication list given to you today.  *If you need a refill on your cardiac medications before your next appointment, please  call your pharmacy*  Follow-Up: At [facility], you and your health needs are our priority.  As part of our continuing mission to provide you with exceptional heart care, we have created designated Provider Care Teams.  These Care Teams include your primary Cardiologist (physician) and Advanced Practice Providers (APPs -  Physician Assistants and Nurse Practitioners) who all work together to provide you with the care you need, when you need it.   Your next appointment:   As needed with Dr. Elease Hashimoto   Signed, Kristeen Miss, MD  04/04/2023 11:40 AM    [facility]

## 2023-04-04 ENCOUNTER — Encounter: Payer: Self-pay | Admitting: Cardiovascular Disease

## 2023-04-04 ENCOUNTER — Ambulatory Visit: Payer: Medicare Other | Attending: Cardiovascular Disease | Admitting: Cardiovascular Disease

## 2023-04-04 ENCOUNTER — Ambulatory Visit: Payer: PRIVATE HEALTH INSURANCE | Admitting: Cardiovascular Disease

## 2023-04-04 VITALS — BP 138/78 | HR 59 | Ht 62.0 in

## 2023-04-04 DIAGNOSIS — I825Y3 Chronic embolism and thrombosis of unspecified deep veins of proximal lower extremity, bilateral: Secondary | ICD-10-CM | POA: Insufficient documentation

## 2023-04-04 DIAGNOSIS — I480 Paroxysmal atrial fibrillation: Secondary | ICD-10-CM | POA: Diagnosis not present

## 2023-04-04 DIAGNOSIS — I10 Essential (primary) hypertension: Secondary | ICD-10-CM | POA: Diagnosis not present

## 2023-04-04 NOTE — Patient Instructions (Signed)
Medication Instructions:  Your physician recommends that you continue on your current medications as directed. Please refer to the Current Medication list given to you today.  *If you need a refill on your cardiac medications before your next appointment, please call your pharmacy*  Follow-Up: At [facility], you and your health needs are our priority.  As part of our continuing mission to provide you with exceptional heart care, we have created designated Provider Care Teams.  These Care Teams include your primary Cardiologist (physician) and Advanced Practice Providers (APPs -  Physician Assistants and Nurse Practitioners) who all work together to provide you with the care you need, when you need it.   Your next appointment:   As needed with Dr. Elease Hashimoto

## 2023-04-24 ENCOUNTER — Encounter: Payer: Self-pay | Admitting: Family

## 2023-04-24 ENCOUNTER — Ambulatory Visit (INDEPENDENT_AMBULATORY_CARE_PROVIDER_SITE_OTHER): Payer: Medicare Other | Admitting: Family

## 2023-04-24 VITALS — BP 116/66 | HR 67 | Temp 97.6°F | Resp 18

## 2023-04-24 DIAGNOSIS — N644 Mastodynia: Secondary | ICD-10-CM | POA: Diagnosis not present

## 2023-04-24 NOTE — Progress Notes (Unsigned)
Provider: Richarda Blade FNP-C  Tuwana Kapaun, Donalee Citrin, NP  Patient Care Team: Rasa Degrazia, Donalee Citrin, NP as PCP - General (Family Medicine) Nyoka Cowden, MD as Consulting Physician (Pulmonary Disease) Hazel Sams, NP as Nurse Practitioner (Psychiatry) [facility], [place]  Extended Emergency Contact Information Primary Emergency Contact: Bergum,Roberta(Bobbi) Address: [address]          [place], [place] [address] [place] of [place] Home Phone: [phone] Mobile Phone: [phone] Relation: Daughter Secondary Emergency Contact: Molinelli,David Address: [address]           [place], [place] [address] [place] Home Phone: [phone] Work Phone: [phone] Relation: Son  Code Status:  DNR Goals of care: Advanced Directive information    01/17/2023    9:35 AM  Advanced Directives  Does Patient Have a Medical Advance Directive? Yes  Type of Advance Directive Out of facility DNR (pink MOST or yellow form)  Does patient want to make changes to medical advance directive? No - Patient declined     Chief Complaint  Patient presents with   Acute Visit    Patient presents today for skin check.    HPI:  Pt is a 88 y.o. female seen today for an acute visit for evaluation of left breast pain.she is here with her care giver who provides HPI information.States patient complains left breast pain whenever she is giving a sponge bath.states breast feels hard and nipple has turned downward.No redness or color change.applies barrier cream for skin protection.states no drainage from the nipple.  Has a significant medical history of left breast cancer with partial mastectomy and re-excision in 2020.    Past Medical History:  Diagnosis Date   A-fib (HCC)    Anemia    Anxiety    Arthritis    hands, spine    Bladder incontinence    Blood transfusion    Cancer (HCC) 02/2019   left breast cancer   Cervicalgia    Constipation    Cough     Wert-onset 08/2009, as of 2014- resolved    Deep venous thrombosis (HCC)    post op, rec'd/needed  blood thinner    Dementia (HCC)    Depression    Depression with anxiety    Diabetes (HCC)    Dysrhythmia    a-fib   Falls frequently    pt. reports that she was falling 3 times a day, has had PT at a facility for a while & now is getting home PT 2-3 times/ week    Headache(784.0) 09/27/2011   History of stress test    15-20 yrs.ago    Hypotension    Hypothyroidism    Lumbar radiculopathy 09/26/2011   Pneumonia    never been in hosp. for pneumonia    Prediabetes    Right hand fracture    Monday   Shortness of breath    Sleep apnea    does not use consistently   Past Surgical History:  Procedure Laterality Date   ANTERIOR CERVICAL DECOMP/DISCECTOMY FUSION N/A 07/09/2013   Procedure: ANTERIOR CERVICAL DECOMPRESSION/DISCECTOMY FUSION 3 LEVELS;  Surgeon: Emilee Hero, MD;  Location: [facility] OR;  Service: Orthopedics;  Laterality: N/A;  Anterior cervical decompression fusion, cervical 3-4, cervical 4-5-, cervical 5-6 with instrumentation, allograft.   APPENDECTOMY     BACK SURGERY     2000   BREAST EXCISIONAL BIOPSY     breast bxs on left x 2, breast bx of right x 1-all  benign   EYE SURGERY     cataracts bilateral /w IOL   HERNIA REPAIR     umbilical hernia- 2004   IR IMAGING GUIDED PORT INSERTION  04/28/2019   JOINT REPLACEMENT  2006,2007   bilateral   LAPAROSCOPIC OVARIAN CYSTECTOMY     not done by laparscopy-abdominal incision   LEG SURGERY     ORIF PERIPROSTHETIC FRACTURE Right 07/26/2019   Procedure: OPEN REDUCTION INTERNAL FIXATION (ORIF) RIGHT FEMUR PERIPROSTHETIC FRACTURE;  Surgeon: Samson Frederic, MD;  Location: [facility] OR;  Service: Orthopedics;  Laterality: Right;   PORTACATH PLACEMENT Right 04/01/2019   Procedure: ATTEMPTED INSERTION PORT-A-CATH WITH ULTRASOUND;  Surgeon: Abigail Miyamoto, MD;  Location: [facility];  Service: General;  Laterality: Right;    RADIOACTIVE SEED GUIDED PARTIAL MASTECTOMY WITH AXILLARY SENTINEL LYMPH NODE BIOPSY Left 04/01/2019   Procedure: LEFT BREAST PARTIAL MASTECTOMY WITH RADIOACTIVE SEED AND LEFT SENTINEL LYMPH NODE BIOPSY;  Surgeon: Abigail Miyamoto, MD;  Location: [facility];  Service: General;  Laterality: Left;   RE-EXCISION OF BREAST CANCER,SUPERIOR MARGINS Left 04/15/2019   Procedure: RE-EXCISION OF LEFT BREAST CANCER POSITIVE MARGINS;  Surgeon: Abigail Miyamoto, MD;  Location: [facility];  Service: General;  Laterality: Left;   REPLACEMENT TOTAL KNEE BILATERAL     TONSILLECTOMY      Allergies  Allergen Reactions   Clindamycin Hcl Other (See Comments)    REACTION: swelling, pt. Reports that she had a 16 lb. weightgain in one day    Outpatient Encounter Medications as of 04/24/2023  Medication Sig   brexpiprazole (REXULTI) 2 MG TABS tablet Take 2 mg by mouth at bedtime.   busPIRone (BUSPAR) 5 MG tablet Take 5-10 mg by mouth 2 (two) times daily. 10 MG IN THE MORNING, 5 MG AT DINNER   CALCIUM CITRATE PO Take 600 mg by mouth daily.    clonazePAM (KLONOPIN) 0.5 MG tablet Take 0.25 mg by mouth as needed for anxiety.   D-Mannose 500 MG CAPS Take by mouth daily.   donepezil (ARICEPT) 10 MG tablet Take 1 tablet (10 mg total) by mouth at bedtime. Take 1  tablet at 10 mg daily   DULoxetine (CYMBALTA) 30 MG capsule Take 90 mg by mouth daily.   ELIQUIS 5 MG TABS tablet TAKE 1 TABLET BY MOUTH TWICE A DAY   furosemide (LASIX) 20 MG tablet TAKE 1 TABLET BY MOUTH EVERY DAY   KLOR-CON M20 20 MEQ tablet TAKE 1 TABLET BY MOUTH EVERY DAY   levothyroxine (SYNTHROID) 50 MCG tablet TAKE 1 TABLET BY MOUTH DAILY BEFORE BREAKFAST. (Patient not taking: Reported on 04/24/2023)   mirtazapine (REMERON) 15 MG tablet Take 7.5 mg by mouth at bedtime.   Multiple Vitamins-Minerals (MULTIVITAMIN PO) Take 1 tablet by mouth daily.    oxybutynin (DITROPAN-XL) 10 MG 24 hr tablet Take 10 mg by mouth daily.    Probiotic Product (PROBIOTIC DAILY PO) Take 1 tablet by mouth daily. OTC   senna-docusate (SENOKOT-S) 8.6-50 MG tablet Take 1 tablet by mouth 2 (two) times daily as needed for mild constipation.   traMADol (ULTRAM) 50 MG tablet Take 50 mg by mouth every 6 (six) hours as needed.   traZODone (DESYREL) 100 MG tablet Take 100 mg by mouth at bedtime.   trimethoprim (TRIMPEX) 100 MG tablet Take 100 mg by mouth at bedtime.   vitamin B-12 (CYANOCOBALAMIN) 500 MCG tablet Take 500 mcg by mouth daily.   Vitamin D, Ergocalciferol, (DRISDOL) 1.25 MG (50000 UNIT) CAPS capsule Take 50,000  Units by mouth once a week.   No facility-administered encounter medications on file as of 04/24/2023.    Review of Systems  Constitutional:  Negative for appetite change, chills, fatigue, fever and unexpected weight change.  Respiratory:  Negative for cough, chest tightness, shortness of breath and wheezing.   Cardiovascular:  Negative for chest pain, palpitations and leg swelling.  Gastrointestinal:  Negative for vomiting.  Musculoskeletal:  Positive for arthralgias and gait problem. Negative for back pain, joint swelling and myalgias.  Skin:  Negative for color change, pallor, rash and wound.       Left breast pain     Immunization History  Administered Date(s) Administered   Fluad Quad(high Dose 65+) 07/22/2020, 09/29/2022   Influenza Split 08/14/2013   Influenza Whole 08/20/2009   Influenza, High Dose Seasonal PF 09/11/2017, 07/25/2018, 09/14/2021   Influenza,inj,Quad PF,6+ Mos 08/04/2014, 09/06/2015, 07/10/2016   PFIZER Comirnaty(Gray Top)Covid-19 Tri-Sucrose Vaccine 02/23/2021   PFIZER(Purple Top)SARS-COV-2 Vaccination 12/27/2019, 01/17/2020, 08/30/2020, 09/14/2021   Pneumococcal Conjugate-13 01/14/2015   Pneumococcal Polysaccharide-23 07/11/2013   Td 11/20/2005, 11/27/2016   Pertinent  Health Maintenance Due  Topic Date Due   OPHTHALMOLOGY EXAM  02/16/2021   FOOT EXAM  07/22/2021   HEMOGLOBIN A1C   05/28/2023   INFLUENZA VACCINE  06/21/2023   DEXA SCAN  Completed      10/06/2022   11:06 AM 10/19/2022    3:28 PM 11/27/2022    9:35 AM 12/05/2022    9:54 AM 04/24/2023    1:10 PM  Fall Risk  Falls in the past year? 1 1 1  0 0  Was there an injury with Fall? 1 0 1 0 0  Fall Risk Category Calculator 2 2 2  0 0  Fall Risk Category (Retired) Moderate Moderate Moderate    (RETIRED) Patient Fall Risk Level Moderate fall risk High fall risk Moderate fall risk    Patient at Risk for Falls Due to History of fall(s);Impaired balance/gait;Impaired mobility  History of fall(s);Impaired balance/gait;Impaired mobility No Fall Risks No Fall Risks  Fall risk Follow up Falls evaluation completed;Education provided;Falls prevention discussed Falls evaluation completed Falls evaluation completed;Education provided;Falls prevention discussed Falls evaluation completed Falls evaluation completed   Functional Status Survey:    Vitals:   04/24/23 1309  BP: 116/66  Pulse: 67  Temp: 97.6 F (36.4 C)  SpO2: 96%   There is no height or weight on file to calculate BMI. Physical Exam Vitals reviewed.  Constitutional:      General: She is not in acute distress.    Appearance: Normal appearance. She is not ill-appearing or diaphoretic.  HENT:     Head: Normocephalic.  Cardiovascular:     Rate and Rhythm: Normal rate and regular rhythm.     Pulses: Normal pulses.     Heart sounds: Normal heart sounds. No murmur heard.    No friction rub. No gallop.  Pulmonary:     Effort: Pulmonary effort is normal. No respiratory distress.     Breath sounds: Normal breath sounds. No wheezing, rhonchi or rales.  Chest:     Chest wall: No tenderness.  Breasts:    Left: Inverted nipple and tenderness present. No swelling, bleeding, nipple discharge or skin change.     Comments: Left breast smaller than the right hard and tender to palpation. Abdominal:     General: Bowel sounds are normal. There is no distension.      Palpations: Abdomen is soft. There is no mass.     Tenderness: There is  no abdominal tenderness. There is no right CVA tenderness, left CVA tenderness, guarding or rebound.  Skin:    General: Skin is warm and dry.     Coloration: Skin is not pale.     Findings: No bruising, erythema, lesion or rash.  Neurological:     Mental Status: She is alert. Mental status is at baseline.     Motor: No weakness.     Gait: Gait abnormal.  Psychiatric:        Mood and Affect: Mood normal.        Speech: Speech normal.        Behavior: Behavior normal.     Labs reviewed: Recent Labs    09/27/22 1309 10/06/22 1132 11/27/22 1027  NA 144 141 142  K 4.4 4.5 4.6  CL 110 105 105  CO2 26 29 24   GLUCOSE 113* 105* 110  BUN 23 22 15   CREATININE 0.95 0.95 1.00*  CALCIUM 9.6 8.9 9.2   Recent Labs    05/25/22 0944 09/27/22 1309 11/27/22 1027  AST 16 19 12   ALT 9 14 6   ALKPHOS  --  67  --   BILITOT 0.4 0.6 0.3  PROT 6.4 6.7 6.4  ALBUMIN  --  3.3*  --    Recent Labs    09/27/22 1309 10/06/22 1132 11/27/22 1027 12/19/22 1152  WBC 9.7 6.5 6.0 6.8  NEUTROABS 7.2 4,817 4,218  --   HGB 9.9* 9.7* 10.9* 11.0*  HCT 33.3* 31.3* 34.7* 34.3*  MCV 81.6 79.2* 80.3 78.8  PLT 280 337 306 302.0   Lab Results  Component Value Date   TSH 1.41 11/27/2022   Lab Results  Component Value Date   HGBA1C 5.2 11/27/2022   Lab Results  Component Value Date   CHOL 204 (H) 05/25/2022   HDL 59 05/25/2022   LDLCALC 122 (H) 05/25/2022   TRIG 115 05/25/2022   CHOLHDL 3.5 05/25/2022    Significant Diagnostic Results in last 30 days:  No results found.  Assessment/Plan  Breast pain, left Left breast smaller than the right hard and tender to palpation with inverted nipple.discussed option with patient for diagnostic mammography verse monitoring due to her advance age but she would like mammogram ordered.Patient on wheelchair unclear if she will be able to stand for prolong period of time.Might require a  ultrasound if unable to do a mammogram.  - MM Digital Diagnostic Unilat L; Future  Family/ staff Communication: Reviewed plan of care with patient verbalized understanding   Labs/tests ordered:  - MM Digital Diagnostic Unilat L; Future  Next Appointment: Return if symptoms worsen or fail to improve.   Caesar Bookman, NP

## 2023-04-26 ENCOUNTER — Telehealth: Payer: Self-pay | Admitting: Family

## 2023-04-26 ENCOUNTER — Ambulatory Visit (INDEPENDENT_AMBULATORY_CARE_PROVIDER_SITE_OTHER): Payer: Medicare Other | Admitting: Physician Assistant

## 2023-04-26 ENCOUNTER — Encounter: Payer: Self-pay | Admitting: Physician Assistant

## 2023-04-26 VITALS — BP 112/54 | HR 75 | Resp 20 | Ht 62.0 in

## 2023-04-26 DIAGNOSIS — F01518 Vascular dementia, unspecified severity, with other behavioral disturbance: Secondary | ICD-10-CM

## 2023-04-26 MED ORDER — DONEPEZIL HCL 10 MG PO TABS: 10.0000 mg | ORAL_TABLET | Freq: Every day | ORAL | 11 refills | Status: DC

## 2023-04-26 MED ORDER — MEMANTINE HCL 5 MG PO TABS: ORAL_TABLET | ORAL | 11 refills | Status: DC

## 2023-04-26 NOTE — Patient Instructions (Addendum)
It was a pleasure to see you today at our office.   Recommendations:  Follow up in 6  months  Start Memantine 5 mg: Take 1 tablet (5 mg at night) for 2 weeks, then increase to 1 tablet (5 mg) twice a day   Continue donepezil 10 mg daily. Side effects were discussed  Continue the Senior program      For assessment of decision of mental capacity and competency:  Call Dr. Erick Blinks, geriatric psychiatrist at [phone] Counseling regarding caregiver distress, including caregiver depression, anxiety and issues regarding community resources, adult day care programs, adult living facilities, or memory care questions:  please contact your  Primary Doctor's Social Worker  Whom to call: Memory  decline, memory medications: Call our office [phone]  For psychiatric meds, mood meds: Please have your primary care physician manage these medications.  If you have any severe symptoms of a stroke, or other severe issues such as confusion,severe chills or fever, etc call 911 or go to the ER as you may need to be evaluated further      RECOMMENDATIONS FOR ALL PATIENTS WITH MEMORY PROBLEMS: 1. Continue to exercise (Recommend 30 minutes of walking everyday, or 3 hours every week) 2. Increase social interactions - continue going to [place] and enjoy social gatherings with friends and family 3. Eat healthy, avoid fried foods and eat more fruits and vegetables 4. Maintain adequate blood pressure, blood sugar, and blood cholesterol level. Reducing the risk of stroke and cardiovascular disease also helps promoting better memory. 5. Avoid stressful situations. Live a simple life and avoid aggravations. Organize your time and prepare for the next day in anticipation. 6. Sleep well, avoid any interruptions of sleep and avoid any distractions in the bedroom that may interfere with adequate sleep quality 7. Avoid sugar, avoid sweets as there is a strong link between excessive sugar intake, diabetes, and  cognitive impairment We discussed the Mediterranean diet, which has been shown to help patients reduce the risk of progressive memory disorders and reduces cardiovascular risk. This includes eating fish, eat fruits and green leafy vegetables, nuts like almonds and hazelnuts, walnuts, and also use olive oil. Avoid fast foods and fried foods as much as possible. Avoid sweets and sugar as sugar use has been linked to worsening of memory function.  There is always a concern of gradual progression of memory problems. If this is the case, then we may need to adjust level of care according to patient needs. Support, both to the patient and caregiver, should then be put into place.    The Alzheimer's Association is here all day, every day for people facing Alzheimer's disease through our free 24/7 Helpline: [phone]. The Helpline provides reliable information and support to all those who need assistance, such as individuals living with memory loss, Alzheimer's or other dementia, caregivers, health care professionals and the public.  Our highly trained and knowledgeable staff can help you with: Understanding memory loss, dementia and Alzheimer's  Medications and other treatment options  General information about aging and brain health  Skills to provide quality care and to find the best care from professionals  Legal, financial and living-arrangement decisions Our Helpline also features: Confidential care consultation provided by master's level clinicians who can help with decision-making support, crisis assistance and education on issues families face every day  Help in a caller's preferred language using our translation service that features more than 200 languages and dialects  Referrals to local community programs, services and ongoing  support     FALL PRECAUTIONS: Be cautious when walking. Scan the area for obstacles that may increase the risk of trips and falls. When getting up in the mornings,  sit up at the edge of the bed for a few minutes before getting out of bed. Consider elevating the bed at the head end to avoid drop of blood pressure when getting up. Walk always in a well-lit room (use night lights in the walls). Avoid area rugs or power cords from appliances in the middle of the walkways. Use a walker or a cane if necessary and consider physical therapy for balance exercise. Get your eyesight checked regularly.  FINANCIAL OVERSIGHT: Supervision, especially oversight when making financial decisions or transactions is also recommended.  HOME SAFETY: Consider the safety of the kitchen when operating appliances like stoves, microwave oven, and blender. Consider having supervision and share cooking responsibilities until no longer able to participate in those. Accidents with firearms and other hazards in the house should be identified and addressed as well.   ABILITY TO BE LEFT ALONE: If patient is unable to contact 911 operator, consider using LifeLine, or when the need is there, arrange for someone to stay with patients. Smoking is a fire hazard, consider supervision or cessation. Risk of wandering should be assessed by caregiver and if detected at any point, supervision and safe proof recommendations should be instituted.  MEDICATION SUPERVISION: Inability to self-administer medication needs to be constantly addressed. Implement a mechanism to ensure safe administration of the medications.   DRIVING: Regarding driving, in patients with progressive memory problems, driving will be impaired. We advise to have someone else do the driving if trouble finding directions or if minor accidents are reported. Independent driving assessment is available to determine safety of driving.   If you are interested in the driving assessment, you can contact the following:  [employer] in [place] [phone]  [facility] [phone]  [facility]  [phone] or [phone]      Mediterranean Diet A Mediterranean diet refers to food and lifestyle choices that are based on the traditions of countries located on the [employer]. This way of eating has been shown to help prevent certain conditions and improve outcomes for people who have chronic diseases, like kidney disease and heart disease. What are tips for following this plan? Lifestyle  Cook and eat meals together with your family, when possible. Drink enough fluid to keep your urine clear or pale yellow. Be physically active every day. This includes: Aerobic exercise like running or swimming. Leisure activities like gardening, walking, or housework. Get 7-8 hours of sleep each night. If recommended by your health care provider, drink red wine in moderation. This means 1 glass a day for nonpregnant women and 2 glasses a day for men. A glass of wine equals 5 oz (150 mL). Reading food labels  Check the serving size of packaged foods. For foods such as rice and pasta, the serving size refers to the amount of cooked product, not dry. Check the total fat in packaged foods. Avoid foods that have saturated fat or trans fats. Check the ingredients list for added sugars, such as corn syrup. Shopping  At the grocery store, buy most of your food from the areas near the walls of the store. This includes: Fresh fruits and vegetables (produce). Grains, beans, nuts, and seeds. Some of these may be available in unpackaged forms or large amounts (in bulk). Fresh seafood. Poultry  and eggs. Low-fat dairy products. Buy whole ingredients instead of prepackaged foods. Buy fresh fruits and vegetables in-season from local farmers markets. Buy frozen fruits and vegetables in resealable bags. If you do not have access to quality fresh seafood, buy precooked frozen shrimp or canned fish, such as tuna, salmon, or sardines. Buy small amounts of raw or cooked  vegetables, salads, or olives from the deli or salad bar at your store. Stock your pantry so you always have certain foods on hand, such as olive oil, canned tuna, canned tomatoes, rice, pasta, and beans. Cooking  Cook foods with extra-virgin olive oil instead of using butter or other vegetable oils. Have meat as a side dish, and have vegetables or grains as your main dish. This means having meat in small portions or adding small amounts of meat to foods like pasta or stew. Use beans or vegetables instead of meat in common dishes like chili or lasagna. Experiment with different cooking methods. Try roasting or broiling vegetables instead of steaming or sauteing them. Add frozen vegetables to soups, stews, pasta, or rice. Add nuts or seeds for added healthy fat at each meal. You can add these to yogurt, salads, or vegetable dishes. Marinate fish or vegetables using olive oil, lemon juice, garlic, and fresh herbs. Meal planning  Plan to eat 1 vegetarian meal one day each week. Try to work up to 2 vegetarian meals, if possible. Eat seafood 2 or more times a week. Have healthy snacks readily available, such as: Vegetable sticks with hummus. Greek yogurt. Fruit and nut trail mix. Eat balanced meals throughout the week. This includes: Fruit: 2-3 servings a day Vegetables: 4-5 servings a day Low-fat dairy: 2 servings a day Fish, poultry, or lean meat: 1 serving a day Beans and legumes: 2 or more servings a week Nuts and seeds: 1-2 servings a day Whole grains: 6-8 servings a day Extra-virgin olive oil: 3-4 servings a day Limit red meat and sweets to only a few servings a month What are my food choices? Mediterranean diet Recommended Grains: Whole-grain pasta. Brown rice. Bulgar wheat. Polenta. Couscous. Whole-wheat bread. Orpah Cobb. Vegetables: Artichokes. Beets. Broccoli. Cabbage. Carrots. Eggplant. Green beans. Chard. Kale. Spinach. Onions. Leeks. Peas. Squash. Tomatoes. Peppers.  Radishes. Fruits: Apples. Apricots. Avocado. Berries. Bananas. Cherries. Dates. Figs. Grapes. Lemons. Melon. Oranges. Peaches. Plums. Pomegranate. Meats and other protein foods: Beans. Almonds. Sunflower seeds. Pine nuts. Peanuts. Cod. Salmon. Scallops. Shrimp. Tuna. Tilapia. Clams. Oysters. Eggs. Dairy: Low-fat milk. Cheese. Greek yogurt. Beverages: Water. Red wine. Herbal tea. Fats and oils: Extra virgin olive oil. Avocado oil. Grape seed oil. Sweets and desserts: [place] yogurt with honey. Baked apples. Poached pears. Trail mix. Seasoning and other foods: Basil. Cilantro. Coriander. Cumin. Mint. Parsley. Sage. Rosemary. Tarragon. Garlic. Oregano. Thyme. Pepper. Balsalmic vinegar. Tahini. Hummus. Tomato sauce. Olives. Mushrooms. Limit these Grains: Prepackaged pasta or rice dishes. Prepackaged cereal with added sugar. Vegetables: Deep fried potatoes (french fries). Fruits: Fruit canned in syrup. Meats and other protein foods: Beef. Pork. Lamb. Poultry with skin. Hot dogs. Tomasa Blase. Dairy: Ice cream. Sour cream. Whole milk. Beverages: Juice. Sugar-sweetened soft drinks. Beer. Liquor and spirits. Fats and oils: Butter. Canola oil. Vegetable oil. Beef fat (tallow). Lard. Sweets and desserts: Cookies. Cakes. Pies. Candy. Seasoning and other foods: Mayonnaise. Premade sauces and marinades. The items listed may not be a complete list. Talk with your dietitian about what dietary choices are right for you. Summary The Mediterranean diet includes both food and lifestyle choices. Eat a variety of  fresh fruits and vegetables, beans, nuts, seeds, and whole grains. Limit the amount of red meat and sweets that you eat. Talk with your health care provider about whether it is safe for you to drink red wine in moderation. This means 1 glass a day for nonpregnant women and 2 glasses a day for men. A glass of wine equals 5 oz (150 mL). This information is not intended to replace advice given to you by your health  care provider. Make sure you discuss any questions you have with your health care provider. Document Released: 06/29/2016 Document Revised: 08/01/2016 Document Reviewed: 06/29/2016 Elsevier Interactive Patient Education  2017 [employer].

## 2023-04-26 NOTE — Telephone Encounter (Signed)
Will send order to [facility] since patient resides in [place].

## 2023-04-26 NOTE — Addendum Note (Signed)
Addended byRicharda Blade C on: 04/26/2023 04:50 PM   Modules accepted: Orders

## 2023-04-26 NOTE — Telephone Encounter (Signed)
Please resend mammogram to hospital as requested by care giver.

## 2023-04-26 NOTE — Progress Notes (Signed)
Assessment/Plan:    Dementia likely due to Alzheimer's disease and vascular etiology with behavioral disturbance, late onset  Andrea [name] is a very pleasant 88 y.o. RH female with a history of atrial fibrillation, and anemia, anxiety, arthritis, OSA noncompliant with CPAP, currently DVT of both lower extremities hypothyroidism, history of lumbar radiculopathy, DM2, pneumonia requiring hospitalization, depression with anxiety on multiple agents followed by psychiatry seen today in follow up for memory loss. Patient is currently on donepezil 10 mg daily.  Cognitive decline is reported by her daughter and caregiver, now having more difficulty recognizing them, or remembering their names.  She needs more assistance with ADLs.  She is now attending once a week in memory care center for a few hours, and she enjoys it very much.  Discussed with her daughter starting memantine, benefits and risk of the medication.  They would like to try a low-dose and observe if any side effects appear and they agree that if not therapeutic, this will be discontinued.  Recommendations  Follow up in 6 months. Continue donepezil 10 mg daily, side effects discussed Start memantine 5 mg twice daily, side effects discussed Recommend good control of her cardiovascular risk factors Continue to control mood as per PCP and psychiatry    Subjective:    This patient is accompanied in the office by her caregiver   who supplements the history.  Previous records as well as any outside records available were reviewed prior to todays visit. Patient was last seen on  10/19/2022    Any changes in memory since last visit?  "Has had 3 rough days in a row, forgot her mother has passed and was having lots of questions about it, in the morning is worse ".sometimes she says that "she wants to go home.  This is to be in the afternoon, now as earlier ".  She continues to have more difficulty remembering new information, recent  conversations and names of people.  "The other day she called her caregiver "you are the sweet girl ".  She also has difficulty remembering her daughter's name.  She recently started to go to goes to a [employer] at [employer] care once weekly which is wonderful to her "-daughter says     She enjoys the company of her caregiver "she has never been to social ". repeats oneself?  Endorsed Disoriented when walking into a room?  Sometimes she wants to go home as mentioned above, home being a childhood place.   Leaving objects in unusual places? denies   Wandering behavior?  denies   Any personality changes since last visit?  She has a history of anxiety, followed with Psychiatry.  There have been some mood changes in her prior visit, with some moments of anxiety Any worsening depression?:  denies   Hallucinations or paranoia?  denies   Seizures?    denies    Any sleep changes? Sometimes "she sleeps ok (85 percent), but other nights she may be more restless ".   Denies vivid dreams, REM behavior or sleepwalking   Sleep apnea?   Endorsed.  Has not used the CPAP in years Any hygiene concerns?    denies   Independent of bathing and dressing?  Caregiver helps her changing and bathing Does the patient needs help with medications?  Caregiver feels the medication bottles and gives them to her Who is in charge of the finances?  Daughter is in charge     Any changes in appetite? More trouble eating  by herself, needs help, especially due to her vision problems     Patient have trouble swallowing? She had a couple of episodes with a pill, she was coughing till expelling the pill.  Daughter is monitoring this changes Does the patient cook?  No   Any headaches?   denies   Chronic back pain She has a history of chronic back pain Ambulates with difficulty?  She uses a walker to ambulate, and wheelchair if she has to go out.  She does not like to stand up during visits to the providers, as she may experience a  panic attack.  She does not want to participate on her home exercises with her caregiver.  Recent falls or head injuries? denies     Unilateral weakness, numbness or tingling?  denies   Any tremors?  denies   Any anosmia?  Patient denies   Any incontinence of urine?  Endorsed.  She has history of frequent UTIs and wears pads Any bowel dysfunction?     denies      Patient lives with her daughter  Does the patient drive? no longer drives    Initial visit 10/17/2021 the patient is seen in neurologic consultation at the request of Ngetich, Dinah C, NP for the evaluation of memory loss.  The patient is accompanied by her caregiver who supplements the history.This is a 88 y.o. year old female who has had worsening memory issues for about few months when she would get UTIs ( 5 bouts of them ).  However, the Caregiver reports that her memory changes may have been present for at least 1-1/2-year, when she appears to be more disoriented, and has more trouble to remember new information, or people's names, or "mixes them ".  For example, she has a son who lives in [place], she cannot remember his name.  She denies repeating the same stories or asking the same questions.  However, she has been forgetting for example, how to sit in a chair or which way she needs to go when she is going from one room to another.  The patient states that "it may have been there for a longer time than that, but I did not want to tell anyone".  She denies leaving objects in unusual places.  She lives with her daughter.  Her mood is usually good, only when the caregiver is not there, she may show more concern, and feel "more down".  When the caregiver is there, she has more social interaction.  She no longer drives.  She sleeps well, "a lot more than before ".  She denies any vivid dreams or sleepwalking.  No hallucinations or paranoia.  She uses a walker to ambulate, and a wheelchair at times.  In the past, she tried physical therapy 2-3  times a week, but she is no longer interested, "she did not like it ".  No recent falls or head injuries.  The caregiver is not aware of any hygiene concerns, she needs assistance with bathing and dressing.  The caregiver is in charge of the medications, and the finances are taking care of by the daughter.  Her appetite is good, denies trouble swallowing.  She no longer cooks. Denies headaches, double vision, dizziness, focal numbness or tingling, unilateral weakness or tremors or anosmia. No history of seizures.  As mentioned above, she has frequent UTIs, wears pads and pull ups , retention, denies constipation or diarrhea.  She denies anosmia, she did not have any recent COVID.  She has OSA, but lately has not been using the CPAP, because "makes me cold ".  She denies any alcohol or tobacco abuse.  Family history negative for Alzheimer's disease or any other type of dementia.   Labs 05/2021 TSH 1.26, B12 1272 PREVIOUS MEDICATIONS:   CURRENT MEDICATIONS:  Outpatient Encounter Medications as of 04/26/2023  Medication Sig   brexpiprazole (REXULTI) 2 MG TABS tablet Take 2 mg by mouth at bedtime.   busPIRone (BUSPAR) 5 MG tablet Take 5-10 mg by mouth 2 (two) times daily. 10 MG IN THE MORNING, 5 MG AT DINNER   CALCIUM CITRATE PO Take 600 mg by mouth daily.    clonazePAM (KLONOPIN) 0.5 MG tablet Take 0.25 mg by mouth as needed for anxiety.   D-Mannose 500 MG CAPS Take by mouth daily.   DULoxetine (CYMBALTA) 30 MG capsule Take 90 mg by mouth daily.   ELIQUIS 5 MG TABS tablet TAKE 1 TABLET BY MOUTH TWICE A DAY   furosemide (LASIX) 20 MG tablet TAKE 1 TABLET BY MOUTH EVERY DAY   KLOR-CON M20 20 MEQ tablet TAKE 1 TABLET BY MOUTH EVERY DAY   levothyroxine (SYNTHROID) 50 MCG tablet TAKE 1 TABLET BY MOUTH DAILY BEFORE BREAKFAST.   memantine (NAMENDA) 5 MG tablet Take 1 tablet (5 mg at night) for 2 weeks, then increase to 1 tablet (5 mg) twice a day   mirtazapine (REMERON) 15 MG tablet Take 7.5 mg by mouth at  bedtime.   Multiple Vitamins-Minerals (MULTIVITAMIN PO) Take 1 tablet by mouth daily.    oxybutynin (DITROPAN-XL) 10 MG 24 hr tablet Take 10 mg by mouth daily.   Probiotic Product (PROBIOTIC DAILY PO) Take 1 tablet by mouth daily. OTC   senna-docusate (SENOKOT-S) 8.6-50 MG tablet Take 1 tablet by mouth 2 (two) times daily as needed for mild constipation.   traMADol (ULTRAM) 50 MG tablet Take 50 mg by mouth every 6 (six) hours as needed.   traZODone (DESYREL) 100 MG tablet Take 100 mg by mouth at bedtime.   trimethoprim (TRIMPEX) 100 MG tablet Take 100 mg by mouth at bedtime.   vitamin B-12 (CYANOCOBALAMIN) 500 MCG tablet Take 500 mcg by mouth daily.   Vitamin D, Ergocalciferol, (DRISDOL) 1.25 MG (50000 UNIT) CAPS capsule Take 50,000 Units by mouth once a week.   [DISCONTINUED] donepezil (ARICEPT) 10 MG tablet Take 1 tablet (10 mg total) by mouth at bedtime. Take 1  tablet at 10 mg daily   donepezil (ARICEPT) 10 MG tablet Take 1 tablet (10 mg total) by mouth daily. Take 1  tablet at 10 mg daily   No facility-administered encounter medications on file as of 04/26/2023.       09/29/2021    3:52 PM 12/10/2018    1:20 PM 12/03/2017    8:57 AM  MMSE - Mini Mental State Exam  Orientation to time 1 5 5   Orientation to Place 2 4 4   Registration 2 3 3   Attention/ Calculation 0 4 5  Recall 2 2 1   Language- name 2 objects 2 2 2   Language- repeat 1 1 1   Language- follow 3 step command 2 2 3   Language- read & follow direction 1 1 1   Write a sentence 0 1 1  Copy design 0 0 1  Total score 13 25 27        No data to display          Objective:     PHYSICAL EXAMINATION:    VITALS:  Vitals:   04/26/23 1114  BP: (!) 112/54  Pulse: 75  Resp: 20  SpO2: 95%  Height: 5\' 2"  (1.575 m)    GEN:  The patient appears stated age and is in NAD. HEENT:  Normocephalic, atraumatic.   Neurological examination:  General: NAD, well-groomed, appears stated age. Orientation: The patient is alert.   Anxious appearing.  Oriented to person, place and not to date Cranial nerves: There is good facial symmetry.The speech is fluent and clear. No aphasia or dysarthria. Fund of knowledge is reduced.  Recent and remote memory are impaired. Attention and concentration are reduced.  Able to name objects and repeat phrases.  Hearing is intact to conversational tone.  Sensation: Sensation is intact to light touch throughout Motor: Strength is at least antigravity x4. DTR's 2/4 in UE/LE     Movement examination: Tone: There is normal tone in the UE/LE Abnormal movements:  no tremor.  No myoclonus.  No asterixis.   Coordination:  There is no decremation with RAM's. Normal finger to nose  Gait and Station: The patient has  difficulty arising out of a wheelchair without the use of the hands because it provokes anxiety unable to test gait and stride   Thank you for allowing [place] the opportunity to participate in the care of this nice patient. Please do not hesitate to contact us for any questions or concerns.   Total time spent on today's visit was 37 minutes dedicated to this patient today, preparing to see patient, examining the patient, ordering tests and/or medications and counseling the patient, documenting clinical information in the EHR or other health record, independently interpreting results and communicating results to the patient/family, discussing treatment and goals, answering patient's questions and coordinating care.  Cc:  Ngetich, Donalee Citrin, NP  Marlowe Kays 04/26/2023 12:41 PM

## 2023-04-26 NOTE — Telephone Encounter (Signed)
Vicky Scales (the woman who brought the patient in on 04/24/23) called regarding the referral for a mammogram that you made to the [facility]. She would rather that order be sent to the hospital since the patient is wheel chair bound and cannot stand. I told her I would bring this to your attention.

## 2023-04-30 ENCOUNTER — Other Ambulatory Visit: Payer: Self-pay | Admitting: Family

## 2023-04-30 DIAGNOSIS — N644 Mastodynia: Secondary | ICD-10-CM

## 2023-05-03 DIAGNOSIS — F411 Generalized anxiety disorder: Secondary | ICD-10-CM | POA: Diagnosis not present

## 2023-05-03 DIAGNOSIS — F332 Major depressive disorder, recurrent severe without psychotic features: Secondary | ICD-10-CM | POA: Diagnosis not present

## 2023-05-03 DIAGNOSIS — F341 Dysthymic disorder: Secondary | ICD-10-CM | POA: Diagnosis not present

## 2023-05-10 ENCOUNTER — Other Ambulatory Visit: Payer: Self-pay | Admitting: Family

## 2023-05-10 ENCOUNTER — Ambulatory Visit
Admission: RE | Admit: 2023-05-10 | Discharge: 2023-05-10 | Disposition: A | Discharge status: Home / Self Care | Payer: Medicare Other | Source: Ambulatory Visit | Attending: Family | Admitting: Family

## 2023-05-10 DIAGNOSIS — N6320 Unspecified lump in the left breast, unspecified quadrant: Secondary | ICD-10-CM

## 2023-05-10 DIAGNOSIS — N644 Mastodynia: Secondary | ICD-10-CM

## 2023-05-10 DIAGNOSIS — N6342 Unspecified lump in left breast, subareolar: Secondary | ICD-10-CM | POA: Diagnosis not present

## 2023-05-14 ENCOUNTER — Other Ambulatory Visit: Payer: Self-pay | Admitting: Family

## 2023-05-14 DIAGNOSIS — M25561 Pain in right knee: Secondary | ICD-10-CM

## 2023-05-14 NOTE — Telephone Encounter (Signed)
    important  Maximum MME cannot be calculated for this prescription. Enter discrete sig details to calculate maximum MME.    

## 2023-05-16 ENCOUNTER — Ambulatory Visit
Admission: RE | Admit: 2023-05-16 | Discharge: 2023-05-16 | Disposition: A | Discharge status: Home / Self Care | Payer: Medicare Other | Source: Ambulatory Visit | Attending: Family | Admitting: Family

## 2023-05-16 DIAGNOSIS — N6320 Unspecified lump in the left breast, unspecified quadrant: Secondary | ICD-10-CM

## 2023-05-16 DIAGNOSIS — C50012 Malignant neoplasm of nipple and areola, left female breast: Secondary | ICD-10-CM | POA: Diagnosis not present

## 2023-05-16 DIAGNOSIS — N644 Mastodynia: Secondary | ICD-10-CM

## 2023-05-16 DIAGNOSIS — N6342 Unspecified lump in left breast, subareolar: Secondary | ICD-10-CM | POA: Diagnosis not present

## 2023-05-16 HISTORY — PX: BREAST BIOPSY: SHX20

## 2023-05-18 ENCOUNTER — Other Ambulatory Visit: Payer: Self-pay | Admitting: Physician Assistant

## 2023-05-20 ENCOUNTER — Other Ambulatory Visit: Payer: Self-pay | Admitting: Family

## 2023-05-20 DIAGNOSIS — Z79899 Other long term (current) drug therapy: Secondary | ICD-10-CM

## 2023-05-23 ENCOUNTER — Other Ambulatory Visit: Payer: PRIVATE HEALTH INSURANCE

## 2023-05-28 ENCOUNTER — Other Ambulatory Visit: Payer: Self-pay | Admitting: Surgery

## 2023-05-28 ENCOUNTER — Ambulatory Visit: Payer: Medicare Other | Admitting: Family

## 2023-05-28 DIAGNOSIS — C50912 Malignant neoplasm of unspecified site of left female breast: Secondary | ICD-10-CM | POA: Diagnosis not present

## 2023-05-29 ENCOUNTER — Telehealth: Payer: Self-pay

## 2023-05-29 NOTE — Telephone Encounter (Signed)
   Pre-operative Risk Assessment    Patient Name: Andrea [name]  DOB: 1935-07-06 MRN: [id number]      Request for Surgical Clearance    Procedure:   MASTECTOMY  Date of Surgery:  Clearance TBD                                 Surgeon:  DR. Abigail Miyamoto Surgeon's Group or Practice Name:  [facility] Phone number:  [phone] Fax number:  [phone]   Type of Clearance Requested:   - Pharmacy:  [facility] (Eliquis) NEEDS INSTRUCTIONS    Type of Anesthesia:  General    Additional requests/questions:    SignedMichaelle Copas   05/29/2023, 3:58 PM

## 2023-05-30 ENCOUNTER — Other Ambulatory Visit: Payer: Self-pay

## 2023-05-30 ENCOUNTER — Encounter (HOSPITAL_COMMUNITY): Payer: Self-pay

## 2023-05-30 ENCOUNTER — Emergency Department (HOSPITAL_COMMUNITY): Payer: Medicare Other

## 2023-05-30 ENCOUNTER — Inpatient Hospital Stay (HOSPITAL_COMMUNITY)
Admission: EM | Admit: 2023-05-30 | Discharge: 2023-06-08 | DRG: 101 | Disposition: A | Discharge status: Hospice | Payer: Medicare Other | Attending: Internal Medicine | Admitting: Internal Medicine

## 2023-05-30 ENCOUNTER — Inpatient Hospital Stay (HOSPITAL_COMMUNITY): Payer: Medicare Other

## 2023-05-30 ENCOUNTER — Inpatient Hospital Stay (HOSPITAL_COMMUNITY)
Admit: 2023-05-30 | Discharge: 2023-05-30 | Disposition: A | Discharge status: Home / Self Care | Payer: Medicare Other | Attending: Neurology | Admitting: Neurology

## 2023-05-30 DIAGNOSIS — E1165 Type 2 diabetes mellitus with hyperglycemia: Secondary | ICD-10-CM | POA: Diagnosis present

## 2023-05-30 DIAGNOSIS — C787 Secondary malignant neoplasm of liver and intrahepatic bile duct: Secondary | ICD-10-CM | POA: Diagnosis present

## 2023-05-30 DIAGNOSIS — C50919 Malignant neoplasm of unspecified site of unspecified female breast: Secondary | ICD-10-CM | POA: Insufficient documentation

## 2023-05-30 DIAGNOSIS — R000 Tachycardia, unspecified: Secondary | ICD-10-CM | POA: Diagnosis not present

## 2023-05-30 DIAGNOSIS — F341 Dysthymic disorder: Secondary | ICD-10-CM | POA: Diagnosis present

## 2023-05-30 DIAGNOSIS — R54 Age-related physical debility: Secondary | ICD-10-CM | POA: Diagnosis present

## 2023-05-30 DIAGNOSIS — E669 Obesity, unspecified: Secondary | ICD-10-CM | POA: Diagnosis present

## 2023-05-30 DIAGNOSIS — F32A Depression, unspecified: Secondary | ICD-10-CM | POA: Diagnosis not present

## 2023-05-30 DIAGNOSIS — E039 Hypothyroidism, unspecified: Secondary | ICD-10-CM | POA: Diagnosis not present

## 2023-05-30 DIAGNOSIS — E876 Hypokalemia: Secondary | ICD-10-CM | POA: Diagnosis present

## 2023-05-30 DIAGNOSIS — G319 Degenerative disease of nervous system, unspecified: Secondary | ICD-10-CM | POA: Diagnosis not present

## 2023-05-30 DIAGNOSIS — C7802 Secondary malignant neoplasm of left lung: Secondary | ICD-10-CM | POA: Diagnosis not present

## 2023-05-30 DIAGNOSIS — I10 Essential (primary) hypertension: Secondary | ICD-10-CM | POA: Diagnosis present

## 2023-05-30 DIAGNOSIS — Z7189 Other specified counseling: Secondary | ICD-10-CM

## 2023-05-30 DIAGNOSIS — Z66 Do not resuscitate: Secondary | ICD-10-CM

## 2023-05-30 DIAGNOSIS — D630 Anemia in neoplastic disease: Secondary | ICD-10-CM | POA: Diagnosis present

## 2023-05-30 DIAGNOSIS — F05 Delirium due to known physiological condition: Secondary | ICD-10-CM | POA: Diagnosis not present

## 2023-05-30 DIAGNOSIS — R569 Unspecified convulsions: Secondary | ICD-10-CM | POA: Diagnosis not present

## 2023-05-30 DIAGNOSIS — Z9151 Personal history of suicidal behavior: Secondary | ICD-10-CM

## 2023-05-30 DIAGNOSIS — D649 Anemia, unspecified: Secondary | ICD-10-CM | POA: Diagnosis present

## 2023-05-30 DIAGNOSIS — E872 Acidosis, unspecified: Secondary | ICD-10-CM | POA: Diagnosis present

## 2023-05-30 DIAGNOSIS — K769 Liver disease, unspecified: Secondary | ICD-10-CM | POA: Diagnosis not present

## 2023-05-30 DIAGNOSIS — Z79899 Other long term (current) drug therapy: Secondary | ICD-10-CM

## 2023-05-30 DIAGNOSIS — Z8661 Personal history of infections of the central nervous system: Secondary | ICD-10-CM

## 2023-05-30 DIAGNOSIS — F0284 Dementia in other diseases classified elsewhere, unspecified severity, with anxiety: Secondary | ICD-10-CM | POA: Diagnosis not present

## 2023-05-30 DIAGNOSIS — Z6835 Body mass index (BMI) 35.0-35.9, adult: Secondary | ICD-10-CM

## 2023-05-30 DIAGNOSIS — Z9221 Personal history of antineoplastic chemotherapy: Secondary | ICD-10-CM

## 2023-05-30 DIAGNOSIS — Z881 Allergy status to other antibiotic agents status: Secondary | ICD-10-CM

## 2023-05-30 DIAGNOSIS — Z515 Encounter for palliative care: Secondary | ICD-10-CM | POA: Diagnosis not present

## 2023-05-30 DIAGNOSIS — Z961 Presence of intraocular lens: Secondary | ICD-10-CM | POA: Diagnosis present

## 2023-05-30 DIAGNOSIS — F0283 Dementia in other diseases classified elsewhere, unspecified severity, with mood disturbance: Secondary | ICD-10-CM | POA: Diagnosis present

## 2023-05-30 DIAGNOSIS — Z9842 Cataract extraction status, left eye: Secondary | ICD-10-CM

## 2023-05-30 DIAGNOSIS — I480 Paroxysmal atrial fibrillation: Secondary | ICD-10-CM | POA: Diagnosis not present

## 2023-05-30 DIAGNOSIS — R4589 Other symptoms and signs involving emotional state: Secondary | ICD-10-CM

## 2023-05-30 DIAGNOSIS — Z818 Family history of other mental and behavioral disorders: Secondary | ICD-10-CM

## 2023-05-30 DIAGNOSIS — Z833 Family history of diabetes mellitus: Secondary | ICD-10-CM

## 2023-05-30 DIAGNOSIS — Z8249 Family history of ischemic heart disease and other diseases of the circulatory system: Secondary | ICD-10-CM

## 2023-05-30 DIAGNOSIS — Z8261 Family history of arthritis: Secondary | ICD-10-CM

## 2023-05-30 DIAGNOSIS — F17210 Nicotine dependence, cigarettes, uncomplicated: Secondary | ICD-10-CM | POA: Diagnosis present

## 2023-05-30 DIAGNOSIS — G40409 Other generalized epilepsy and epileptic syndromes, not intractable, without status epilepticus: Principal | ICD-10-CM | POA: Diagnosis present

## 2023-05-30 DIAGNOSIS — N390 Urinary tract infection, site not specified: Secondary | ICD-10-CM | POA: Diagnosis present

## 2023-05-30 DIAGNOSIS — R918 Other nonspecific abnormal finding of lung field: Secondary | ICD-10-CM | POA: Diagnosis not present

## 2023-05-30 DIAGNOSIS — G309 Alzheimer's disease, unspecified: Secondary | ICD-10-CM | POA: Diagnosis not present

## 2023-05-30 DIAGNOSIS — I825Y3 Chronic embolism and thrombosis of unspecified deep veins of proximal lower extremity, bilateral: Secondary | ICD-10-CM | POA: Diagnosis present

## 2023-05-30 DIAGNOSIS — C50912 Malignant neoplasm of unspecified site of left female breast: Secondary | ICD-10-CM | POA: Diagnosis not present

## 2023-05-30 DIAGNOSIS — G4733 Obstructive sleep apnea (adult) (pediatric): Secondary | ICD-10-CM

## 2023-05-30 DIAGNOSIS — E785 Hyperlipidemia, unspecified: Secondary | ICD-10-CM | POA: Diagnosis present

## 2023-05-30 DIAGNOSIS — Z79890 Hormone replacement therapy (postmenopausal): Secondary | ICD-10-CM

## 2023-05-30 DIAGNOSIS — I700 Atherosclerosis of aorta: Secondary | ICD-10-CM | POA: Diagnosis not present

## 2023-05-30 DIAGNOSIS — I6782 Cerebral ischemia: Secondary | ICD-10-CM | POA: Diagnosis not present

## 2023-05-30 DIAGNOSIS — G894 Chronic pain syndrome: Secondary | ICD-10-CM | POA: Diagnosis present

## 2023-05-30 DIAGNOSIS — C7801 Secondary malignant neoplasm of right lung: Secondary | ICD-10-CM | POA: Diagnosis present

## 2023-05-30 DIAGNOSIS — Z820 Family history of epilepsy and other diseases of the nervous system: Secondary | ICD-10-CM

## 2023-05-30 DIAGNOSIS — Z9012 Acquired absence of left breast and nipple: Secondary | ICD-10-CM

## 2023-05-30 DIAGNOSIS — Z8541 Personal history of malignant neoplasm of cervix uteri: Secondary | ICD-10-CM

## 2023-05-30 DIAGNOSIS — Z7901 Long term (current) use of anticoagulants: Secondary | ICD-10-CM

## 2023-05-30 DIAGNOSIS — Z96653 Presence of artificial knee joint, bilateral: Secondary | ICD-10-CM | POA: Diagnosis present

## 2023-05-30 DIAGNOSIS — Z981 Arthrodesis status: Secondary | ICD-10-CM

## 2023-05-30 DIAGNOSIS — G40909 Epilepsy, unspecified, not intractable, without status epilepticus: Secondary | ICD-10-CM | POA: Diagnosis not present

## 2023-05-30 DIAGNOSIS — R824 Acetonuria: Secondary | ICD-10-CM | POA: Diagnosis present

## 2023-05-30 DIAGNOSIS — R0689 Other abnormalities of breathing: Secondary | ICD-10-CM | POA: Diagnosis not present

## 2023-05-30 DIAGNOSIS — R296 Repeated falls: Secondary | ICD-10-CM | POA: Diagnosis present

## 2023-05-30 DIAGNOSIS — Z9841 Cataract extraction status, right eye: Secondary | ICD-10-CM

## 2023-05-30 LAB — URINALYSIS, ROUTINE W REFLEX MICROSCOPIC
Bilirubin Urine: NEGATIVE
Glucose, UA: NEGATIVE mg/dL
Hgb urine dipstick: NEGATIVE
Ketones, ur: 5 mg/dL — AB
Nitrite: NEGATIVE
Protein, ur: 100 mg/dL — AB
Specific Gravity, Urine: 1.017 (ref 1.005–1.030)
WBC, UA: >50 WBC/hpf (ref 0–5)
pH: 6 (ref 5.0–8.0)

## 2023-05-30 LAB — CBG MONITORING, ED
Glucose-Capillary: 145 mg/dL — ABNORMAL HIGH (ref 70–99)
Glucose-Capillary: 109 mg/dL — ABNORMAL HIGH (ref 70–99)

## 2023-05-30 LAB — CBC WITH DIFFERENTIAL/PLATELET
Abs Immature Granulocytes: 0.07 10*3/uL (ref 0.00–0.07)
Basophils Absolute: 0.1 10*3/uL (ref 0.0–0.1)
Basophils Relative: 1 %
Eosinophils Absolute: 0.1 10*3/uL (ref 0.0–0.5)
Eosinophils Relative: 1 %
HCT: 37.2 % (ref 36.0–46.0)
Hemoglobin: 11 g/dL — ABNORMAL LOW (ref 12.0–15.0)
Immature Granulocytes: 1 %
Lymphocytes Relative: 26 %
Lymphs Abs: 3.3 10*3/uL (ref 0.7–4.0)
MCH: 25.6 pg — ABNORMAL LOW (ref 26.0–34.0)
MCHC: 29.6 g/dL — ABNORMAL LOW (ref 30.0–36.0)
MCV: 86.7 fL (ref 80.0–100.0)
Monocytes Absolute: 0.8 10*3/uL (ref 0.1–1.0)
Monocytes Relative: 7 %
Neutro Abs: 8.4 10*3/uL — ABNORMAL HIGH (ref 1.7–7.7)
Neutrophils Relative %: 64 %
Platelets: 348 10*3/uL (ref 150–400)
RBC: 4.29 MIL/uL (ref 3.87–5.11)
RDW: 15.8 % — ABNORMAL HIGH (ref 11.5–15.5)
WBC: 12.8 10*3/uL — ABNORMAL HIGH (ref 4.0–10.5)
nRBC: 0 % (ref 0.0–0.2)

## 2023-05-30 LAB — COMPREHENSIVE METABOLIC PANEL
ALT: 11 U/L (ref 0–44)
AST: 22 U/L (ref 15–41)
Albumin: 3.4 g/dL — ABNORMAL LOW (ref 3.5–5.0)
Alkaline Phosphatase: 61 U/L (ref 38–126)
Anion gap: 18 — ABNORMAL HIGH (ref 5–15)
BUN: 20 mg/dL (ref 8–23)
CO2: 17 mmol/L — ABNORMAL LOW (ref 22–32)
Calcium: 9.1 mg/dL (ref 8.9–10.3)
Chloride: 104 mmol/L (ref 98–111)
Creatinine, Ser: 1.11 mg/dL — ABNORMAL HIGH (ref 0.44–1.00)
GFR, Estimated: 49 mL/min — ABNORMAL LOW (ref >60)
Glucose, Bld: 161 mg/dL — ABNORMAL HIGH (ref 70–99)
Potassium: 3.1 mmol/L — ABNORMAL LOW (ref 3.5–5.1)
Sodium: 139 mmol/L (ref 135–145)
Total Bilirubin: 0.3 mg/dL (ref 0.3–1.2)
Total Protein: 7 g/dL (ref 6.5–8.1)

## 2023-05-30 LAB — PHOSPHORUS: Phosphorus: 3 mg/dL (ref 2.5–4.6)

## 2023-05-30 LAB — GLUCOSE, CAPILLARY
Glucose-Capillary: 124 mg/dL — ABNORMAL HIGH (ref 70–99)
Glucose-Capillary: 123 mg/dL — ABNORMAL HIGH (ref 70–99)

## 2023-05-30 LAB — MAGNESIUM: Magnesium: 2 mg/dL (ref 1.7–2.4)

## 2023-05-30 MED ORDER — POTASSIUM CHLORIDE 10 MEQ/100ML IV SOLN
10.0000 meq | INTRAVENOUS | Status: AC
  Administered 2023-05-30: 10 meq via INTRAVENOUS
  Filled 2023-05-30: qty 100

## 2023-05-30 MED ORDER — SODIUM CHLORIDE 0.9% FLUSH: 10.0000 mL | INTRAVENOUS | Status: DC | PRN

## 2023-05-30 MED ORDER — LEVOTHYROXINE SODIUM 50 MCG PO TABS
50.0000 ug | ORAL_TABLET | Freq: Every day | ORAL | Status: DC
  Administered 2023-06-01 – 2023-06-08 (×7): 50 ug via ORAL
  Filled 2023-05-30 (×9): qty 1

## 2023-05-30 MED ORDER — FUROSEMIDE 20 MG PO TABS
20.0000 mg | ORAL_TABLET | Freq: Every day | ORAL | Status: DC
  Administered 2023-05-30 – 2023-05-31 (×2): 20 mg via ORAL
  Filled 2023-05-30 (×2): qty 1

## 2023-05-30 MED ORDER — POTASSIUM CHLORIDE CRYS ER 20 MEQ PO TBCR
40.0000 meq | EXTENDED_RELEASE_TABLET | Freq: Once | ORAL | Status: DC
  Filled 2023-05-30: qty 2

## 2023-05-30 MED ORDER — CYANOCOBALAMIN 500 MCG PO TABS
500.0000 ug | ORAL_TABLET | Freq: Every day | ORAL | Status: DC
  Administered 2023-05-31 – 2023-06-08 (×9): 500 ug via ORAL
  Filled 2023-05-30 (×10): qty 1

## 2023-05-30 MED ORDER — SODIUM CHLORIDE 0.9 % IV SOLN: INTRAVENOUS | Status: DC

## 2023-05-30 MED ORDER — VALPROATE SODIUM 100 MG/ML IV SOLN
1860.0000 mg | Freq: Once | INTRAVENOUS | Status: AC
  Administered 2023-05-30: 1860 mg via INTRAVENOUS
  Filled 2023-05-30: qty 18.6

## 2023-05-30 MED ORDER — GADOBUTROL 1 MMOL/ML IV SOLN
9.0000 mL | Freq: Once | INTRAVENOUS | Status: AC | PRN
  Administered 2023-05-30: 9 mL via INTRAVENOUS

## 2023-05-30 MED ORDER — ACETAMINOPHEN 650 MG RE SUPP: 650.0000 mg | Freq: Four times a day (QID) | RECTAL | Status: DC | PRN

## 2023-05-30 MED ORDER — OMEGA-3-ACID ETHYL ESTERS 1 G PO CAPS
1.0000 g | ORAL_CAPSULE | Freq: Every evening | ORAL | Status: DC
  Administered 2023-05-30 – 2023-06-06 (×6): 1 g via ORAL
  Filled 2023-05-30 (×7): qty 1

## 2023-05-30 MED ORDER — TRAMADOL HCL 50 MG PO TABS: 50.0000 mg | ORAL_TABLET | Freq: Four times a day (QID) | ORAL | Status: DC | PRN

## 2023-05-30 MED ORDER — ONDANSETRON HCL 4 MG PO TABS: 4.0000 mg | ORAL_TABLET | Freq: Four times a day (QID) | ORAL | Status: DC | PRN

## 2023-05-30 MED ORDER — SODIUM CHLORIDE 0.9 % IV SOLN
1.0000 g | Freq: Once | INTRAVENOUS | Status: AC
  Administered 2023-05-30: 1 g via INTRAVENOUS
  Filled 2023-05-30: qty 10

## 2023-05-30 MED ORDER — INSULIN ASPART 100 UNIT/ML IJ SOLN
0.0000 [IU] | Freq: Three times a day (TID) | INTRAMUSCULAR | Status: DC
  Filled 2023-05-30: qty 0.15

## 2023-05-30 MED ORDER — LEVETIRACETAM IN NACL 500 MG/100ML IV SOLN: 500.0000 mg | Freq: Two times a day (BID) | INTRAVENOUS | Status: DC

## 2023-05-30 MED ORDER — DULOXETINE HCL 60 MG PO CPEP
90.0000 mg | ORAL_CAPSULE | Freq: Every day | ORAL | Status: DC
  Administered 2023-05-30 – 2023-06-03 (×5): 90 mg via ORAL
  Filled 2023-05-30 (×5): qty 1

## 2023-05-30 MED ORDER — MAGNESIUM SULFATE 2 GM/50ML IV SOLN
2.0000 g | Freq: Once | INTRAVENOUS | Status: AC
  Administered 2023-05-30: 2 g via INTRAVENOUS
  Filled 2023-05-30: qty 50

## 2023-05-30 MED ORDER — TRAZODONE HCL 100 MG PO TABS
100.0000 mg | ORAL_TABLET | Freq: Every day | ORAL | Status: DC
  Administered 2023-05-30: 100 mg via ORAL
  Filled 2023-05-30: qty 1

## 2023-05-30 MED ORDER — DIAZEPAM 5 MG/ML IJ SOLN: 2.5000 mg | Freq: Once | INTRAMUSCULAR | Status: DC | PRN

## 2023-05-30 MED ORDER — BUSPIRONE HCL 10 MG PO TABS: 5.0000 mg | ORAL_TABLET | Freq: Two times a day (BID) | ORAL | Status: DC

## 2023-05-30 MED ORDER — POTASSIUM CHLORIDE IN NACL 40-0.9 MEQ/L-% IV SOLN
INTRAVENOUS | Status: AC
  Filled 2023-05-30 (×2): qty 1000

## 2023-05-30 MED ORDER — SODIUM CHLORIDE 0.9% FLUSH
10.0000 mL | Freq: Two times a day (BID) | INTRAVENOUS | Status: DC
  Administered 2023-06-01: 30 mL
  Administered 2023-06-04 – 2023-06-07 (×4): 10 mL

## 2023-05-30 MED ORDER — DIAZEPAM 5 MG/ML IJ SOLN: 5.0000 mg | INTRAMUSCULAR | Status: DC | PRN

## 2023-05-30 MED ORDER — VALPROIC ACID 250 MG PO CAPS: 500.0000 mg | ORAL_CAPSULE | Freq: Two times a day (BID) | ORAL | Status: DC

## 2023-05-30 MED ORDER — ACETAMINOPHEN 325 MG PO TABS
650.0000 mg | ORAL_TABLET | Freq: Four times a day (QID) | ORAL | Status: DC | PRN
  Administered 2023-06-04: 650 mg via ORAL
  Filled 2023-05-30: qty 2

## 2023-05-30 MED ORDER — ONDANSETRON HCL 4 MG/2ML IJ SOLN: 4.0000 mg | Freq: Four times a day (QID) | INTRAMUSCULAR | Status: DC | PRN

## 2023-05-30 MED ORDER — CHLORHEXIDINE GLUCONATE CLOTH 2 % EX PADS
6.0000 | MEDICATED_PAD | Freq: Every day | CUTANEOUS | Status: DC
  Administered 2023-05-30 – 2023-06-07 (×9): 6 via TOPICAL

## 2023-05-30 MED ORDER — BUSPIRONE HCL 10 MG PO TABS
10.0000 mg | ORAL_TABLET | Freq: Every day | ORAL | Status: DC
  Administered 2023-05-30 – 2023-06-08 (×10): 10 mg via ORAL
  Filled 2023-05-30 (×10): qty 1

## 2023-05-30 MED ORDER — LEVETIRACETAM IN NACL 1500 MG/100ML IV SOLN
1500.0000 mg | Freq: Once | INTRAVENOUS | Status: AC
  Administered 2023-05-30: 1500 mg via INTRAVENOUS
  Filled 2023-05-30: qty 100

## 2023-05-30 MED ORDER — VALPROATE SODIUM 100 MG/ML IV SOLN
10.0000 mg/kg/d | Freq: Three times a day (TID) | INTRAVENOUS | Status: DC
  Administered 2023-05-30 – 2023-06-01 (×5): 310 mg via INTRAVENOUS
  Filled 2023-05-30 (×8): qty 3.1

## 2023-05-30 MED ORDER — DONEPEZIL HCL 10 MG PO TABS
10.0000 mg | ORAL_TABLET | Freq: Every day | ORAL | Status: DC
  Administered 2023-05-30 – 2023-06-08 (×10): 10 mg via ORAL
  Filled 2023-05-30 (×10): qty 1

## 2023-05-30 MED ORDER — SODIUM CHLORIDE 0.9 % IV SOLN
1.0000 g | INTRAVENOUS | Status: DC
  Administered 2023-05-31 – 2023-06-03 (×4): 1 g via INTRAVENOUS
  Filled 2023-05-30 (×4): qty 10

## 2023-05-30 MED ORDER — POTASSIUM CHLORIDE IN NACL 20-0.9 MEQ/L-% IV SOLN
INTRAVENOUS | Status: DC
  Filled 2023-05-30: qty 1000

## 2023-05-30 MED ORDER — MIRTAZAPINE 15 MG PO TABS
7.5000 mg | ORAL_TABLET | Freq: Every day | ORAL | Status: DC
  Administered 2023-05-30 – 2023-06-02 (×4): 7.5 mg via ORAL
  Filled 2023-05-30 (×4): qty 1

## 2023-05-30 MED ORDER — APIXABAN 5 MG PO TABS
5.0000 mg | ORAL_TABLET | Freq: Two times a day (BID) | ORAL | Status: DC
  Administered 2023-05-30 – 2023-06-08 (×19): 5 mg via ORAL
  Filled 2023-05-30 (×19): qty 1

## 2023-05-30 MED ORDER — LORAZEPAM 2 MG/ML IJ SOLN
INTRAMUSCULAR | Status: AC
  Administered 2023-05-30: 1 mg via INTRAVENOUS
  Filled 2023-05-30: qty 1

## 2023-05-30 MED ORDER — OXYCODONE HCL 5 MG PO TABS: 5.0000 mg | ORAL_TABLET | Freq: Four times a day (QID) | ORAL | Status: DC | PRN

## 2023-05-30 MED ORDER — LORAZEPAM 2 MG/ML IJ SOLN: 1.0000 mg | Freq: Once | INTRAMUSCULAR | Status: AC

## 2023-05-30 MED ORDER — BREXPIPRAZOLE 1 MG PO TABS
2.0000 mg | ORAL_TABLET | Freq: Every day | ORAL | Status: DC
  Administered 2023-05-30 – 2023-06-07 (×9): 2 mg via ORAL
  Filled 2023-05-30 (×3): qty 2
  Filled 2023-05-30: qty 1
  Filled 2023-05-30 (×5): qty 2

## 2023-05-30 MED ORDER — BUSPIRONE HCL 5 MG PO TABS
5.0000 mg | ORAL_TABLET | Freq: Every day | ORAL | Status: DC
  Administered 2023-05-30 – 2023-06-07 (×9): 5 mg via ORAL
  Filled 2023-05-30 (×9): qty 1

## 2023-05-30 NOTE — Procedures (Signed)
Patient Name: Andrea [name]  MRN: [id number]  Epilepsy Attending: Charlsie Quest  Referring Physician/Provider: Caryl Pina, MD  Date: 05/30/2023 Duration: 24.36 mins  Patient history: 88 yo F presents after having a seizure at about 3:00 in the morning. EEG to evaluate for seizure.  Level of alertness:  lethargic   AEDs during EEG study: VPA  Technical aspects: This EEG study was done with scalp electrodes positioned according to the 10-20 International system of electrode placement. Electrical activity was reviewed with band pass filter of 1-70Hz , sensitivity of 7 uV/mm, display speed of 65mm/sec with a 60Hz  notched filter applied as appropriate. EEG data were recorded continuously and digitally stored.  Video monitoring was available and reviewed as appropriate.  Description: EEG showed continuous generalized 3 to 6 Hz theta-delta slowing. Generalized periodic discharges with triphasic morphology at 0.5-1 Hz, more prominent when awake/stimulated were also noted. Hyperventilation and photic stimulation were not performed.     ABNORMALITY - Periodic discharges with triphasic morphology, generalized ( GPDs) - Continuous slow, generalized  IMPRESSION: This study is suggestive of moderate to severe diffuse encephalopathy, nonspecific etiology but likely related to toxic-metabolic causes. No seizures or definite epileptiform discharges were seen throughout the recording.  Andrea [name] [name]

## 2023-05-30 NOTE — ED Notes (Signed)
Patient transported to MRI 

## 2023-05-30 NOTE — ED Notes (Signed)
Family provided with water and mouth swabs for pt

## 2023-05-30 NOTE — Progress Notes (Signed)
EEG complete - results pending 

## 2023-05-30 NOTE — ED Provider Notes (Signed)
[place] [facility] AT [facility] Provider Note  CSN: [id number] Arrival date & time: 05/30/23 0345  Chief Complaint(s) Dementia and Seizures  HPI Andrea [name] is a 88 y.o. female with a past medical history listed below including breast cancer status post left mastectomy who was previously on chemo that was discontinued.  Recently they discovered that the cancer had returned.  Additionally patient has a history of dementia.  She comes in this evening after she had seizure at home that was witnessed by the daughter.  Seizure lasted approximately 1 minute and described as tonic-clonic.  Patient was postictal following.  No recent infections.  No vomiting or diarrhea. Back to her baseline currently.  The history is provided by a relative.    Past Medical History Past Medical History:  Diagnosis Date   A-fib (HCC)    Anemia    Anxiety    Arthritis    hands, spine    Bladder incontinence    Blood transfusion    Cancer (HCC) 02/2019   left breast cancer   Cervicalgia    Constipation    Cough    Wert-onset 08/2009, as of 2014- resolved    Deep venous thrombosis (HCC)    post op, rec'd/needed  blood thinner    Dementia (HCC)    Depression    Depression with anxiety    Diabetes (HCC)    Dysrhythmia    a-fib   Falls frequently    pt. reports that she was falling 3 times a day, has had PT at a facility for a while & now is getting home PT 2-3 times/ week    Headache(784.0) 09/27/2011   History of stress test    15-20 yrs.ago    Hypotension    Hypothyroidism    Lumbar radiculopathy 09/26/2011   Pneumonia    never been in hosp. for pneumonia    Prediabetes    Right hand fracture    Monday   Shortness of breath    Sleep apnea    does not use consistently   Patient Active Problem List   Diagnosis Date Noted   Pneumonia 09/27/2022   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 (HCC) 01/19/2022   Seizures (HCC)  11/25/2020   Periprosthetic fracture of hip 08/08/2019   Right femoral fracture (HCC) 07/25/2019   Personal history of fall 07/03/2019   Cigarette nicotine dependence without complication 07/03/2019   Chronic right-sided low back pain with right-sided sciatica 07/03/2019   Overactive bladder 07/03/2019   Normocytic anemia 07/03/2019   Malignant neoplasm of upper-outer quadrant of left breast in female, estrogen receptor positive (HCC) 03/12/2019   Chronic deep vein thrombosis (DVT) of proximal vein of both lower extremities (HCC) 10/24/2018   Hypercoagulable state due to atrial fibrillation (HCC) 10/18/2017   Diabetes mellitus (HCC) 10/18/2017   Numbness of toes 11/27/2016   Advanced care planning/counseling discussion 04/20/2016   Obesity 04/20/2016   Depression 04/20/2016   Senile osteopenia 04/20/2016   Paroxysmal A-fib (HCC) 04/20/2016   Hyperlipidemia 04/20/2016   OSA on CPAP 10/12/2014   Raynaud phenomenon 10/12/2014   Chronic pain syndrome 10/12/2014   Mixed urge and stress incontinence 10/12/2014   Urinary incontinence, mixed 10/12/2014   Snoring 06/19/2014   Hypersomnia with sleep apnea, unspecified 06/19/2014   Nocturia more than twice per night 06/19/2014   Palpitations 06/10/2014   Urinary incontinence 06/10/2014   Scoliosis 06/10/2014   Hyperglycemia 03/05/2014   Insomnia 03/05/2014   Chronically dry  eyes 02/12/2014   Insomnia due to anxiety and fear 02/12/2014   Hallucination 01/18/2012   UTI (lower urinary tract infection) 01/18/2012   Headache(784.0) 09/27/2011   H/O: encephalitis 09/27/2011   Hyperlipidemia associated with type 2 diabetes mellitus (HCC) 09/26/2011   Lumbar radiculopathy 09/26/2011   Muscle spasm of back 09/26/2011   Visual disturbance 09/26/2011   Falls frequently 09/25/2011   CVD (cerebrovascular disease) 09/25/2011   Hyponatremia 09/25/2011   Obesity, morbid (HCC) 09/25/2011   Delirium due to general medical condition 09/25/2011    Long term (current) use of anticoagulants 02/10/2011   Nonspecific (abnormal) findings on radiological and other examination of body structure 05/05/2010   ABNORMAL LUNG XRAY 05/05/2010   Edema 04/21/2010   DEPRESSION/ANXIETY 03/02/2010   HYPOTENSION 03/02/2010   DYSPNEA 03/02/2010   Cough 03/02/2010   CHEST PAIN 03/02/2010   Arthropathia 08/06/2009   Bipolar I disorder, single manic episode (HCC) 08/06/2009   Chronic pain associated with significant psychosocial dysfunction 08/06/2009   Clinical depression 08/06/2009   Adiposity 08/06/2009   Essential (primary) hypertension 08/06/2009   Acquired hypothyroidism 08/06/2009   Home Medication(s) Prior to Admission medications   Medication Sig Start Date End Date Taking? Authorizing Provider  brexpiprazole (REXULTI) 2 MG TABS tablet Take 2 mg by mouth at bedtime.    [provider]  busPIRone (BUSPAR) 5 MG tablet Take 5-10 mg by mouth 2 (two) times daily. 10 MG IN THE MORNING, 5 MG AT [facility]    [provider]  CALCIUM CITRATE PO Take 600 mg by mouth daily.     [provider]  clonazePAM (KLONOPIN) 0.5 MG tablet Take 0.25 mg by mouth as needed for anxiety.    [provider]  D-Mannose 500 MG CAPS Take by mouth daily.    [provider]  donepezil (ARICEPT) 10 MG tablet Take 1 tablet (10 mg total) by mouth daily. Take 1  tablet at 10 mg daily 04/26/23   Marcos Eke, PA-C  DULoxetine (CYMBALTA) 30 MG capsule Take 90 mg by mouth daily.    [provider]  ELIQUIS 5 MG TABS tablet TAKE 1 TABLET BY MOUTH TWICE A DAY 04/02/23   Ngetich, Dinah C, NP  furosemide (LASIX) 20 MG tablet TAKE 1 TABLET BY MOUTH EVERY DAY 01/11/23   Ngetich, Dinah C, NP  levothyroxine (SYNTHROID) 50 MCG tablet TAKE 1 TABLET BY MOUTH DAILY BEFORE BREAKFAST. 03/27/23   Ngetich, Dinah C, NP  memantine (NAMENDA) 5 MG tablet TAKE 1 TABLET (5 MG AT NIGHT) FOR 2 WEEKS, THEN INCREASE TO 1 TABLET (5 MG) TWICE A DAY 05/18/23    Gwynneth Munson, Sung Amabile, PA-C  mirtazapine (REMERON) 15 MG tablet Take 7.5 mg by mouth at bedtime. 05/12/15   [provider]  Multiple Vitamins-Minerals (MULTIVITAMIN PO) Take 1 tablet by mouth daily.     [provider]  oxybutynin (DITROPAN-XL) 10 MG 24 hr tablet Take 10 mg by mouth daily. 07/08/19   [provider]  potassium chloride SA (KLOR-CON M20) 20 MEQ tablet TAKE 1 TABLET BY MOUTH EVERY DAY 05/21/23   Ngetich, Dinah C, NP  Probiotic Product (PROBIOTIC DAILY PO) Take 1 tablet by mouth daily. OTC    [provider]  senna-docusate (SENOKOT-S) 8.6-50 MG tablet Take 1 tablet by mouth 2 (two) times daily as needed for mild constipation. 07/30/19   Almon Hercules, MD  traMADol (ULTRAM) 50 MG tablet TAKE 1 TABLET (50 MG TOTAL) BY MOUTH EVERY 6 (SIX) HOURS AS  NEEDED FOR MODERATE PAIN 05/14/23   Sharon Seller, NP  traZODone (DESYREL) 100 MG tablet Take 100 mg by mouth at bedtime.    [provider]  trimethoprim (TRIMPEX) 100 MG tablet Take 100 mg by mouth at bedtime. 09/11/22   [provider]  vitamin B-12 (CYANOCOBALAMIN) 500 MCG tablet Take 500 mcg by mouth daily.    [provider]  Vitamin D, Ergocalciferol, (DRISDOL) 1.25 MG (50000 UNIT) CAPS capsule Take 50,000 Units by mouth once a week. 09/03/22   [provider]                                                                                                                                    Allergies Clindamycin hcl  Review of Systems Review of Systems As noted in HPI  Physical Exam Vital Signs  I have reviewed the triage vital signs BP (!) 146/75   Pulse (!) 119   Temp 97.6 F (36.4 C)   Resp 13   SpO2 100%   Physical Exam Vitals reviewed.  Constitutional:      General: She is not in acute distress.    Appearance: She is well-developed. She is not diaphoretic.  HENT:     Head: Normocephalic and atraumatic.     Right Ear: External ear normal.     Left  Ear: External ear normal.     Nose: Nose normal.  Eyes:     General: No scleral icterus.    Conjunctiva/sclera: Conjunctivae normal.  Neck:     Trachea: Phonation normal.  Cardiovascular:     Rate and Rhythm: Normal rate and regular rhythm.  Pulmonary:     Effort: Pulmonary effort is normal. No respiratory distress.     Breath sounds: No stridor.  Abdominal:     General: There is no distension.  Musculoskeletal:        General: Normal range of motion.     Cervical back: Normal range of motion.  Neurological:     Mental Status: She is alert. She is disoriented.     Comments: Follows commands Moves all extremities  Psychiatric:        Behavior: Behavior normal.     ED Results and Treatments Labs (all labs ordered are listed, but only abnormal results are displayed) Labs Reviewed  COMPREHENSIVE METABOLIC PANEL - Abnormal; Notable for the following components:      Result Value   Potassium 3.1 (*)    CO2 17 (*)    Glucose, Bld 161 (*)    Creatinine, Ser 1.11 (*)    Albumin 3.4 (*)    GFR, Estimated 49 (*)    Anion gap 18 (*)    All other components within normal limits  CBC WITH DIFFERENTIAL/PLATELET - Abnormal; Notable for the following components:   WBC 12.8 (*)    Hemoglobin 11.0 (*)    MCH 25.6 (*)    MCHC 29.6 (*)  RDW 15.8 (*)    Neutro Abs 8.4 (*)    All other components within normal limits  URINALYSIS, ROUTINE W REFLEX MICROSCOPIC - Abnormal; Notable for the following components:   Color, Urine AMBER (*)    APPearance CLOUDY (*)    Ketones, ur 5 (*)    Protein, ur 100 (*)    Leukocytes,Ua LARGE (*)    Bacteria, UA FEW (*)    All other components within normal limits  CBG MONITORING, ED - Abnormal; Notable for the following components:   Glucose-Capillary 145 (*)    All other components within normal limits  MAGNESIUM                                                                                                                         EKG  EKG  Interpretation Date/Time:  Wednesday May 30 2023 07:01:50 EDT Ventricular Rate:  85 PR Interval:  178 QRS Duration:  104 QT Interval:  419 QTC Calculation: 499 R Axis:   40  Text Interpretation: Sinus rhythm Low voltage, precordial leads Borderline prolonged QT interval Confirmed by Drema Pry [phone]) on 05/30/2023 7:03:07 AM       Radiology CT HEAD WO CONTRAST  Result Date: 05/30/2023 CLINICAL DATA:  88 year old female with history of new onset of seizure. History of left-sided breast cancer. EXAM: CT HEAD WITHOUT CONTRAST TECHNIQUE: Contiguous axial images were obtained from the base of the skull through the vertex without intravenous contrast. RADIATION DOSE REDUCTION: This exam was performed according to the departmental dose-optimization program which includes automated exposure control, adjustment of the mA and/or kV according to patient size and/or use of iterative reconstruction technique. COMPARISON:  Head CT 09/27/2022. FINDINGS: Brain: Moderate cerebral and mild cerebellar atrophy. Patchy and confluent areas of decreased attenuation are noted throughout the deep and periventricular white matter of the cerebral hemispheres bilaterally, compatible with chronic microvascular ischemic disease. No evidence of acute infarction, hemorrhage, hydrocephalus, extra-axial collection or mass lesion/mass effect. Vascular: No hyperdense vessel or unexpected calcification. Skull: Normal. Negative for fracture or focal lesion. Sinuses/Orbits: No acute finding. Other: None. IMPRESSION: 1. No acute intracranial abnormalities. 2. Moderate cerebral and mild cerebellar atrophy with extensive chronic microvascular ischemic changes in the cerebral white matter, as above, similar to prior studies. Electronically Signed   By: Trudie Reed M.D.   On: 05/30/2023 05:52    Medications Ordered in ED Medications  0.9 %  sodium chloride infusion ( Intravenous New Bag/Given 05/30/23 0453)  LORazepam (ATIVAN)  injection 1 mg (1 mg Intravenous Given 05/30/23 0439)  levETIRAcetam (KEPPRA) IVPB 1500 mg/ 100 mL premix (0 mg Intravenous Stopped 05/30/23 0539)  cefTRIAXone (ROCEPHIN) 1 g in sodium chloride 0.9 % 100 mL IVPB (0 g Intravenous Stopped 05/30/23 0752)   Procedures Procedures  (including critical care time) Medical Decision Making / ED Course   Medical Decision Making Amount and/or Complexity of Data Reviewed Labs: ordered. Decision-making details documented in ED Course. Radiology: ordered and independent interpretation  performed. Decision-making details documented in ED Course. ECG/medicine tests: ordered and independent interpretation performed. Decision-making details documented in ED Course.  Risk Prescription drug management. Decision regarding hospitalization.    1st time seizure. Back to baseline.  Will need to assess for evidence of infection, electrolyte derangements, brain metastases, ICH.  During my evaluation patient had another tonic seizure lasting approximately 30 seconds.  Resolved prior to intervention.  Patient was given 1 mg of Ativan and loaded on Keppra.  Workup: CBG 140s CBC with leukocytosis.  Mild anemia stable hemoglobin. Metabolic panel without significant electrolyte derangements.  Mild renal insufficiency without AKI. UA questionable for possible infection. Given IV rocephin. CT head negative for any obvious mass effect or vasogenic edema.  No ICH.   Clinical Course as of 05/30/23 0819  Wed May 30, 2023  1610 Consulted Neurology [PC]    Clinical Course User Index [PC] Satvik Parco, Amadeo Garnet, MD   Spoke with Dr. Otelia Limes. They will see patient during admission. Plan to admit for MRI and further eval.  Patient care turned over to oncoming provider. Patient case and results discussed in detail; please see their note for further ED managment.    Final Clinical Impression(s) / ED Diagnoses Final diagnoses:  Seizure [facility])    This chart was dictated  using voice recognition software.  Despite best efforts to proofread,  errors can occur which can change the documentation meaning.    Nira Conn, MD 05/30/23 [phone]

## 2023-05-30 NOTE — Plan of Care (Signed)
  Problem: Metabolic: Goal: Ability to maintain appropriate glucose levels will improve Outcome: Progressing   Problem: Skin Integrity: Goal: Risk for impaired skin integrity will decrease Outcome: Progressing   

## 2023-05-30 NOTE — H&P (Signed)
History and Physical    Patient: Andrea [name] WUJ:[id number] DOB: 10-10-1935 DOA: 05/30/2023 DOS: the patient was seen and examined on 05/30/2023 PCP: Ngetich, Donalee Citrin, NP  Patient coming from: Home  Chief Complaint:  Chief Complaint  Patient presents with   Dementia   Seizures   HPI: Andrea [name] is a 88 y.o. female with medical history significant of atrial fibrillation, anemia, anxiety, depression, Alzheimer's dementia, type 2 diabetes, incontinence, constipation, cervicalgia, headaches, hypothyroidism, hypertension, lumbar radiculopathy, history of pneumonia, prediabetes, sleep apnea not on CPAP, cervical cancer, history of left mastectomy who was brought to the emergency department due to worsening dementia and seizures.  Her daughter stated that she was awake around 0300 when she heard her mother made a noise.  She went to her room, found her seizing, rolling her eyes for about a minute and then became postictal.  She stated that for the last few months her dementia has been getting worse.  She used to have a nap in the morning and then nap in the afternoon, but lately she has not been taking her afternoon nap and instead has been showing sundowning symptoms.  She was seen recently by neurology who started her on memantine, but the patient did not tolerated well as she was very somnolent next day after starting with the night dose, so in that month he has not been continued by her family.  She has had good appetite.  No fever, URI symptoms, cough, emesis or diarrhea.  However, the patient is not able to fully express herself when it comes to her symptoms, given her advanced dementia.  Lab work: Her urine analysis was cloudy with ketones of 5 and protein of 100 mg/dL.[name]  There was large leukocyte esterase, negative nitrite, RBC 11-20, WBC more than 50, few bacteria, positive WBC clumps, mucus, budding yeast and hyaline casts.  CBCs are white count 12.8 with 64% neutrophils, 26% lymphocytes  and 7% monocytes.  Hemoglobin 11.0 g/dL and platelets 956.  CMP showed a potassium of 3.1 and CO2 of 17 mmol/L with an anion gap of 18.  The rest of the electrolytes were normal.  Glucose 161, BUN 20 and creatinine 1.11 mg/dL.  LFTs were normal, except for an albumin level 3.4 g/dL.  Imaging: CT head without contrast showing no acute intracranial normalities.  There is moderate cerebral and mild cerebellar atrophy with extensive chronic microvascular ischemic changes in the cerebral white matter.   ED course: Initial vital signs were temperature 97.8 F, pulse 88, respiration 20, BP 130/81 mmHg O2 sat 97% on room air.  The patient received ceftriaxone 1 g IVPB, Keppra 1500 mg IVPB and lorazepam 1 mg IVP.  The case was discussed with neurology who will see the patient W[facility], recommended EEG and MRI of brain.  Review of Systems: As mentioned in the history of present illness. All other systems reviewed and are negative. Past Medical History:  Diagnosis Date   A-fib (HCC)    Anemia    Anxiety    Arthritis    hands, spine    Bladder incontinence    Blood transfusion    Cancer (HCC) 02/2019   left breast cancer   Cervicalgia    Constipation    Cough    Wert-onset 08/2009, as of 2014- resolved    Deep venous thrombosis (HCC)    post op, rec'd/needed  blood thinner    Dementia (HCC)    Depression    Depression with anxiety  Diabetes (HCC)    Dysrhythmia    a-fib   Falls frequently    pt. reports that she was falling 3 times a day, has had PT at a facility for a while & now is getting home PT 2-3 times/ week    Headache(784.0) 09/27/2011   History of stress test    15-20 yrs.ago    Hypotension    Hypothyroidism    Lumbar radiculopathy 09/26/2011   Pneumonia    never been in hosp. for pneumonia    Prediabetes    Right hand fracture    Monday   Shortness of breath    Sleep apnea    does not use consistently   Past Surgical History:  Procedure Laterality Date   ANTERIOR  CERVICAL DECOMP/DISCECTOMY FUSION N/A 07/09/2013   Procedure: ANTERIOR CERVICAL DECOMPRESSION/DISCECTOMY FUSION 3 LEVELS;  Surgeon: Emilee Hero, MD;  Location: [facility] OR;  Service: Orthopedics;  Laterality: N/A;  Anterior cervical decompression fusion, cervical 3-4, cervical 4-5-, cervical 5-6 with instrumentation, allograft.   APPENDECTOMY     BACK SURGERY     2000   BREAST BIOPSY Left 05/16/2023   [place] LT BREAST BX W LOC DEV 1ST LESION IMG BX SPEC US GUIDE 05/16/2023 [facility]   BREAST EXCISIONAL BIOPSY     breast bxs on left x 2, breast bx of right x 1-all benign   BREAST LUMPECTOMY Left 2020   EYE SURGERY     cataracts bilateral /w IOL   HERNIA REPAIR     umbilical hernia- 2004   IR IMAGING GUIDED PORT INSERTION  04/28/2019   JOINT REPLACEMENT  2006,2007   bilateral   LAPAROSCOPIC OVARIAN CYSTECTOMY     not done by laparscopy-abdominal incision   LEG SURGERY     ORIF PERIPROSTHETIC FRACTURE Right 07/26/2019   Procedure: OPEN REDUCTION INTERNAL FIXATION (ORIF) RIGHT FEMUR PERIPROSTHETIC FRACTURE;  Surgeon: Samson Frederic, MD;  Location: [facility] OR;  Service: Orthopedics;  Laterality: Right;   PORTACATH PLACEMENT Right 04/01/2019   Procedure: ATTEMPTED INSERTION PORT-A-CATH WITH ULTRASOUND;  Surgeon: Abigail Miyamoto, MD;  Location: [facility];  Service: General;  Laterality: Right;   RADIOACTIVE SEED GUIDED PARTIAL MASTECTOMY WITH AXILLARY SENTINEL LYMPH NODE BIOPSY Left 04/01/2019   Procedure: LEFT BREAST PARTIAL MASTECTOMY WITH RADIOACTIVE SEED AND LEFT SENTINEL LYMPH NODE BIOPSY;  Surgeon: Abigail Miyamoto, MD;  Location: [facility];  Service: General;  Laterality: Left;   RE-EXCISION OF BREAST CANCER,SUPERIOR MARGINS Left 04/15/2019   Procedure: RE-EXCISION OF LEFT BREAST CANCER POSITIVE MARGINS;  Surgeon: Abigail Miyamoto, MD;  Location: [facility];  Service: General;  Laterality: Left;   REPLACEMENT TOTAL KNEE BILATERAL      TONSILLECTOMY     Social History:  reports that she quit smoking about 2 years ago. Her smoking use included cigarettes. She has a 5.00 pack-year smoking history. She has never used smokeless tobacco. She reports that she does not currently use alcohol. She reports that she does not use drugs.  Allergies  Allergen Reactions   Clindamycin Hcl Other (See Comments)    REACTION: swelling, pt. Reports that she had a 16 lb. weightgain in one day    Family History  Problem Relation Age of Onset   Heart disease Father    Malignant hyperthermia Father    Arthritis Father    Deep vein thrombosis Son    Diabetes Son    Obesity Son    Depression Son    Hypertension Son  Parkinson's disease Mother    Diabetes Daughter    Hypertension Daughter    Anxiety disorder Daughter    Hyperlipidemia Son    Hypertension Son    Cancer Maternal Aunt     Prior to Admission medications   Medication Sig Start Date End Date Taking? Authorizing Provider  brexpiprazole (REXULTI) 2 MG TABS tablet Take 2 mg by mouth at bedtime.    [provider]  busPIRone (BUSPAR) 5 MG tablet Take 5-10 mg by mouth 2 (two) times daily. 10 MG IN THE MORNING, 5 MG AT [facility]    [provider]  CALCIUM CITRATE PO Take 600 mg by mouth daily.     [provider]  clonazePAM (KLONOPIN) 0.5 MG tablet Take 0.25 mg by mouth as needed for anxiety.    [provider]  D-Mannose 500 MG CAPS Take by mouth daily.    [provider]  donepezil (ARICEPT) 10 MG tablet Take 1 tablet (10 mg total) by mouth daily. Take 1  tablet at 10 mg daily 04/26/23   Marcos Eke, PA-C  DULoxetine (CYMBALTA) 30 MG capsule Take 90 mg by mouth daily.    [provider]  ELIQUIS 5 MG TABS tablet TAKE 1 TABLET BY MOUTH TWICE A DAY 04/02/23   Ngetich, Dinah C, NP  furosemide (LASIX) 20 MG tablet TAKE 1 TABLET BY MOUTH EVERY DAY 01/11/23   Ngetich, Dinah C, NP  levothyroxine (SYNTHROID) 50 MCG tablet TAKE 1  TABLET BY MOUTH DAILY BEFORE BREAKFAST. 03/27/23   Ngetich, Dinah C, NP  memantine (NAMENDA) 5 MG tablet TAKE 1 TABLET (5 MG AT NIGHT) FOR 2 WEEKS, THEN INCREASE TO 1 TABLET (5 MG) TWICE A DAY 05/18/23   Gwynneth Munson, Sung Amabile, PA-C  mirtazapine (REMERON) 15 MG tablet Take 7.5 mg by mouth at bedtime. 05/12/15   [provider]  Multiple Vitamins-Minerals (MULTIVITAMIN PO) Take 1 tablet by mouth daily.     [provider]  oxybutynin (DITROPAN-XL) 10 MG 24 hr tablet Take 10 mg by mouth daily. 07/08/19   [provider]  potassium chloride SA (KLOR-CON M20) 20 MEQ tablet TAKE 1 TABLET BY MOUTH EVERY DAY 05/21/23   Ngetich, Dinah C, NP  Probiotic Product (PROBIOTIC DAILY PO) Take 1 tablet by mouth daily. OTC    [provider]  senna-docusate (SENOKOT-S) 8.6-50 MG tablet Take 1 tablet by mouth 2 (two) times daily as needed for mild constipation. 07/30/19   Almon Hercules, MD  traMADol (ULTRAM) 50 MG tablet TAKE 1 TABLET (50 MG TOTAL) BY MOUTH EVERY 6 (SIX) HOURS AS NEEDED FOR MODERATE PAIN 05/14/23   Sharon Seller, NP  traZODone (DESYREL) 100 MG tablet Take 100 mg by mouth at bedtime.    [provider]  trimethoprim (TRIMPEX) 100 MG tablet Take 100 mg by mouth at bedtime. 09/11/22   [provider]  vitamin B-12 (CYANOCOBALAMIN) 500 MCG tablet Take 500 mcg by mouth daily.    [provider]  Vitamin D, Ergocalciferol, (DRISDOL) 1.25 MG (50000 UNIT) CAPS capsule Take 50,000 Units by mouth once a week. 09/03/22   [provider]    Physical Exam: Vitals:   05/30/23 0408 05/30/23 0500 05/30/23 0515 05/30/23 0800  BP: 130/81 (!) 139/114 (!) 146/75 (!) 134/54  Pulse: 88 (!) 108 (!) 119 82  Resp: 20 16 13 14   Temp: 97.8 F (36.6 C)   97.6 F (36.4 C)  TempSrc: Oral     SpO2: 97% 99%  100% 100%   Physical Exam Vitals and nursing note reviewed.  Constitutional:      General: She is awake. She is not in acute distress.    Appearance: She  is obese.  HENT:     Head: Normocephalic.     Nose: No rhinorrhea.     Mouth/Throat:     Mouth: Mucous membranes are dry.  Eyes:     General: No scleral icterus.    Pupils: Pupils are equal, round, and reactive to light.  Neck:     Vascular: No JVD.  Cardiovascular:     Rate and Rhythm: Normal rate and regular rhythm.     Heart sounds: S1 normal and S2 normal.  Abdominal:     General: Bowel sounds are normal.     Palpations: Abdomen is soft.     Tenderness: There is no abdominal tenderness.  Musculoskeletal:     Cervical back: Neck supple.     Right lower leg: No edema.     Left lower leg: No edema.  Skin:    General: Skin is warm and dry.  Neurological:     General: No focal deficit present.     Mental Status: She is alert. She is disoriented.  Psychiatric:        Mood and Affect: Mood normal.        Behavior: Behavior is cooperative.     Data Reviewed:  Results are pending, will review when available.  Assessment and Plan: Principal Problem:   Seizures (HCC) Observation/PCU. Frequent neurochecks. Consult PT and OT. Check EEG. Check MRI of brain. Continue Keppra per neurology. Parenteral Valium as needed for seizure activity. Stroke team input appreciated.  Active Problems:   Metabolic acidosis Transient. Likely due to seizure activity. Will hydrate and follow labs.    Lower urinary tract infectious disease Continue ceftriaxone 1 g IVPB daily. Follow-up urinary culture and sensitivity. Resume prophylactic trimethoprim afterwards.    DEPRESSION/ANXIETY Continue trazodone 100 mg p.o. at bedtime. Continue duloxetine 30 mg p.o. daily. Continue mirtazapine 7.5 mg p.o. daily.    Chronic pain associated with significant psychosocial dysfunction On duloxetine as above. On multiple antidepressants: -Will hold tramadol q6 hr for now to avoid side effects. -Begin oxycodone 5 mg p.o. every 6 hours as needed.    Essential (primary) hypertension On  furosemide 20 mg daily. As needed antihypertensives. Monitor blood pressure and electrolytes.    Acquired hypothyroidism Continue levothyroxine 50 mcg p.o. daily.    OSA on CPAP No longer using CPAP.    Paroxysmal A-fib (HCC) CHA2DS2-VASc Score of at least 6. Continue apixaban 5 mg p.o. twice daily. Currently normal rate and sinus rhythm.    Hyperlipidemia Currently not on statin. Continue fish oil. Follow-up with primary care provider    Chronic deep vein thrombosis (DVT)  of proximal vein of both lower extremities (HCC) Continue apixaban 5 mg p.o. twice daily.    Normocytic anemia Monitor hematocrit and hemoglobin. Transfuse as needed.    Type 2 diabetes mellitus with hyperglycemia (HCC) Carbohydrate modified diet. CBG monitoring with RI SS. Check hemoglobin A1c.     Advance Care Planning:   Code Status: DNR   Consults: Neuro hospitalist team.  Family Communication: Her daughter was at bedside.  Severity of Illness: The appropriate patient status for this patient is INPATIENT. Inpatient status is judged to be reasonable and necessary in order to provide the required intensity of service to ensure the patient's safety. The patient's presenting symptoms, physical exam findings, and  initial radiographic and laboratory data in the context of their chronic comorbidities is felt to place them at high risk for further clinical deterioration. Furthermore, it is not anticipated that the patient will be medically stable for discharge from the hospital within 2 midnights of admission.   * I certify that at the point of admission it is my clinical judgment that the patient will require inpatient hospital care spanning beyond 2 midnights from the point of admission due to high intensity of service, high risk for further deterioration and high frequency of surveillance required.*  Author: Bobette Mo, MD 05/30/2023 8:45 AM  For on call review [email or web address].   This  document was prepared using Dragon voice recognition software and may contain some unintended transcription errors.

## 2023-05-30 NOTE — ED Provider Notes (Signed)
  Physical Exam  BP (!) 146/75   Pulse (!) 119   Temp 97.6 F (36.4 C)   Resp 13   SpO2 100%   Physical Exam  Procedures  Procedures  ED Course / MDM   Clinical Course as of 05/30/23 0818  Wed May 30, 2023  0705 Consulted Neurology [PC]    Clinical Course User Index [PC] Cardama, Amadeo Garnet, MD   Medical Decision Making Amount and/or Complexity of Data Reviewed Labs: ordered. Radiology: ordered.  Risk Prescription drug management.   Received patient in signout.  Had seizure.  Now been able to discuss with neurology.  They will see in consult.  Will get MRI to evaluate for metastatic disease.  [facility] be [facility] to [facility].       Benjiman Core, MD 05/30/23 [phone]

## 2023-05-30 NOTE — ED Notes (Signed)
Dr Eudelia Bunch at bedside when seizure activity occurred. Verbal given to paramedic for 1mg  IV ativan. Seizure precautions in place

## 2023-05-30 NOTE — ED Notes (Signed)
ED TO INPATIENT HANDOFF REPORT  Name/Age/Gender Andrea [name] 88 y.o. female  Code Status    Code Status Orders  (From admission, onward)           Start     Ordered   05/30/23 0953  Do not attempt resuscitation (DNR)  Continuous       Question Answer Comment  If patient has no pulse and is not breathing Do Not Attempt Resuscitation   If patient has a pulse and/or is breathing: Medical Treatment Goals LIMITED ADDITIONAL INTERVENTIONS: Use medication/IV fluids and cardiac monitoring as indicated; Do not use intubation or mechanical ventilation (DNI), also provide comfort medications.  Transfer to Progressive/Stepdown as indicated, avoid Intensive Care.   Consent: Discussion documented in EHR or advanced directives reviewed      05/30/23 0955           Code Status History     Date Active Date Inactive Code Status Order ID Comments User Context   09/27/2022 1741 09/30/2022 1709 DNR [id number]  Dorcas Carrow, MD Inpatient   08/28/2019 1548 09/27/2022 1244 DNR [id number]  Bufford Spikes L Outpatient   07/25/2019 1200 08/06/2019 1659 DNR [id number]  Almon Hercules, MD ED   04/20/2016 1200 04/01/2019 0606 DNR [id number] Discussed at appt 04/20/16.  Pt [place] and is ready to go when it's time.  Does not want resuscitation.  Is going to complete a living will and hcpoa also.  [place] completed and given to her. [facility]   01/18/2012 1113 01/20/2012 2012 DNR [id number]  Alfonse Spruce, RN ED   09/25/2011 2251 09/28/2011 1523 Full Code [id number]  Valaria Good, RN Inpatient       Home/SNF/Other Home  Chief Complaint Seizures [facility]) [R56.9]  Level of Care/Admitting Diagnosis ED Disposition     ED Disposition  Admit   Condition  --   Comment  Hospital Area: [facility] [[id number]]  Level of Care: Progressive [102]  Admit to Progressive based on following criteria: NEUROLOGICAL AND NEUROSURGICAL complex patients with  significant risk of instability, who do not meet ICU criteria, yet require close observation or frequent assessment (< / = every 2 - 4 hours) with medical / nursing intervention.  May admit patient to [facility] or [facility] if equivalent level of care is available:: No  Covid Evaluation: Asymptomatic - no recent exposure (last 10 days) testing not required  Diagnosis: Seizures [facility]) [[id number]]  Admitting Physician: Bobette Mo [[id number]]  Attending Physician: Bobette Mo [[id number]]  Certification:: I certify this patient will need inpatient services for at least 2 midnights  Estimated Length of Stay: 2          Medical History Past Medical History:  Diagnosis Date   A-fib (HCC)    Anemia    Anxiety    Arthritis    hands, spine    Bladder incontinence    Blood transfusion    Cancer (HCC) 02/2019   left breast cancer   Cervicalgia    Constipation    Cough    Wert-onset 08/2009, as of 2014- resolved    Deep venous thrombosis (HCC)    post op, rec'd/needed  blood thinner    Dementia (HCC)    Depression    Depression with anxiety    Diabetes (HCC)    Dysrhythmia    a-fib   Falls frequently    pt. reports that she was falling 3 times a  day, has had PT at a facility for a while & now is getting home PT 2-3 times/ week    Headache(784.0) 09/27/2011   History of stress test    15-20 yrs.ago    Hypotension    Hypothyroidism    Lumbar radiculopathy 09/26/2011   Pneumonia    never been in hosp. for pneumonia    Prediabetes    Right hand fracture    Monday   Shortness of breath    Sleep apnea    does not use consistently    Allergies Allergies  Allergen Reactions   Clindamycin Hcl Other (See Comments)    REACTION: swelling, pt. Reports that she had a 16 lb. weightgain in one day    IV Location/Drains/Wounds Patient Lines/Drains/Airways Status     Active Line/Drains/Airways     Name Placement date Placement time Site Days   Implanted Port Right  Chest --  --  Chest  --   Peripheral IV 05/30/23 22 G Anterior;Distal;Right Forearm 05/30/23  0437  Forearm  less than 1            Labs/Imaging Results for orders placed or performed during the hospital encounter of 05/30/23 (from the past 48 hour(s))  CBG monitoring, ED     Status: Abnormal   Collection Time: 05/30/23  4:33 AM  Result Value Ref Range   Glucose-Capillary 145 (H) 70 - 99 mg/dL    Comment: Glucose reference range applies only to samples taken after fasting for at least 8 hours.  Comprehensive metabolic panel     Status: Abnormal   Collection Time: 05/30/23  4:38 AM  Result Value Ref Range   Sodium 139 135 - 145 mmol/L   Potassium 3.1 (L) 3.5 - 5.1 mmol/L   Chloride 104 98 - 111 mmol/L   CO2 17 (L) 22 - 32 mmol/L   Glucose, Bld 161 (H) 70 - 99 mg/dL    Comment: Glucose reference range applies only to samples taken after fasting for at least 8 hours.   BUN 20 8 - 23 mg/dL   Creatinine, Ser 1.61 (H) 0.44 - 1.00 mg/dL   Calcium 9.1 8.9 - 09.6 mg/dL   Total Protein 7.0 6.5 - 8.1 g/dL   Albumin 3.4 (L) 3.5 - 5.0 g/dL   AST 22 15 - 41 U/L   ALT 11 0 - 44 U/L   Alkaline Phosphatase 61 38 - 126 U/L   Total Bilirubin 0.3 0.3 - 1.2 mg/dL   GFR, Estimated 49 (L) >60 mL/min    Comment: (NOTE) Calculated using the CKD-EPI Creatinine Equation (2021)    Anion gap 18 (H) 5 - 15    Comment: Performed at [facility], [address], [place], [place] [address]  CBC with Differential/Platelet     Status: Abnormal   Collection Time: 05/30/23  4:38 AM  Result Value Ref Range   WBC 12.8 (H) 4.0 - 10.5 K/uL   RBC 4.29 3.87 - 5.11 MIL/uL   Hemoglobin 11.0 (L) 12.0 - 15.0 g/dL   HCT 98.1 19.1 - 47.8 %   MCV 86.7 80.0 - 100.0 fL   MCH 25.6 (L) 26.0 - 34.0 pg   MCHC 29.6 (L) 30.0 - 36.0 g/dL   RDW 29.5 (H) 62.1 - 30.8 %   Platelets 348 150 - 400 K/uL   nRBC 0.0 0.0 - 0.2 %   Neutrophils Relative % 64 %   Neutro Abs 8.4 (H) 1.7 - 7.7 K/uL   Lymphocytes  Relative 26 %   Lymphs Abs 3.3 0.7 - 4.0 K/uL   Monocytes Relative 7 %   Monocytes Absolute 0.8 0.1 - 1.0 K/uL   Eosinophils Relative 1 %   Eosinophils Absolute 0.1 0.0 - 0.5 K/uL   Basophils Relative 1 %   Basophils Absolute 0.1 0.0 - 0.1 K/uL   Immature Granulocytes 1 %   Abs Immature Granulocytes 0.07 0.00 - 0.07 K/uL    Comment: Performed at [facility], [address], [place], [place] [address]  Magnesium     Status: None   Collection Time: 05/30/23  4:38 AM  Result Value Ref Range   Magnesium 2.0 1.7 - 2.4 mg/dL    Comment: Performed at [facility], [address] [place], [place] [address]  Phosphorus     Status: None   Collection Time: 05/30/23  4:38 AM  Result Value Ref Range   Phosphorus 3.0 2.5 - 4.6 mg/dL    Comment: Performed at [facility], [address] [place], [place] [address]  Urinalysis, Routine w reflex microscopic -Urine, Catheterized     Status: Abnormal   Collection Time: 05/30/23  5:17 AM  Result Value Ref Range   Color, Urine AMBER (A) YELLOW    Comment: BIOCHEMICALS MAY BE AFFECTED BY COLOR   APPearance CLOUDY (A) CLEAR   Specific Gravity, Urine 1.017 1.005 - 1.030   pH 6.0 5.0 - 8.0   Glucose, UA NEGATIVE NEGATIVE mg/dL   Hgb urine dipstick NEGATIVE NEGATIVE   Bilirubin Urine NEGATIVE NEGATIVE   Ketones, ur 5 (A) NEGATIVE mg/dL   Protein, ur 696 (A) NEGATIVE mg/dL   Nitrite NEGATIVE NEGATIVE   Leukocytes,Ua LARGE (A) NEGATIVE   RBC / HPF 11-20 0 - 5 RBC/hpf   WBC, UA >50 0 - 5 WBC/hpf   Bacteria, UA FEW (A) NONE SEEN   Squamous Epithelial / HPF 0-5 0 - 5 /HPF   WBC Clumps PRESENT    Mucus PRESENT    Budding Yeast PRESENT    Hyaline Casts, UA PRESENT     Comment: Performed at [facility], [address], [place], [place] [address]   CT HEAD WO CONTRAST  Result Date: 05/30/2023 CLINICAL DATA:  88 year old female with history of new onset of seizure. History of  left-sided breast cancer. EXAM: CT HEAD WITHOUT CONTRAST TECHNIQUE: Contiguous axial images were obtained from the base of the skull through the vertex without intravenous contrast. RADIATION DOSE REDUCTION: This exam was performed according to the departmental dose-optimization program which includes automated exposure control, adjustment of the mA and/or kV according to patient size and/or use of iterative reconstruction technique. COMPARISON:  Head CT 09/27/2022. FINDINGS: Brain: Moderate cerebral and mild cerebellar atrophy. Patchy and confluent areas of decreased attenuation are noted throughout the deep and periventricular white matter of the cerebral hemispheres bilaterally, compatible with chronic microvascular ischemic disease. No evidence of acute infarction, hemorrhage, hydrocephalus, extra-axial collection or mass lesion/mass effect. Vascular: No hyperdense vessel or unexpected calcification. Skull: Normal. Negative for fracture or focal lesion. Sinuses/Orbits: No acute finding. Other: None. IMPRESSION: 1. No acute intracranial abnormalities. 2. Moderate cerebral and mild cerebellar atrophy with extensive chronic microvascular ischemic changes in the cerebral white matter, as above, similar to prior studies. Electronically Signed   By: Trudie Reed M.D.   On: 05/30/2023 05:52    Pending Labs Unresulted Labs (From admission, onward)     Start     Ordered   05/31/23  0500  CBC  Tomorrow morning,   R        05/30/23 0955   05/31/23 0500  Comprehensive metabolic panel  Tomorrow morning,   R        05/30/23 0955   05/31/23 0500  Hemoglobin A1c  Tomorrow morning,   R       Comments: To assess prior glycemic control    05/30/23 0956            Vitals/Pain Today's Vitals   05/30/23 0515 05/30/23 0800 05/30/23 1000 05/30/23 1105  BP: (!) 146/75 (!) 134/54 116/69   Pulse: (!) 119 82 83   Resp: 13 14 17    Temp:  97.6 F (36.4 C)    TempSrc:      SpO2: 100% 100% 100%   Weight:    93  kg  PainSc:        Isolation Precautions No active isolations  Medications Medications  magnesium sulfate IVPB 2 g 50 mL (2 g Intravenous New Bag/Given 05/30/23 1117)  potassium chloride SA (KLOR-CON M) CR tablet 40 mEq (has no administration in time range)  acetaminophen (TYLENOL) tablet 650 mg (has no administration in time range)    Or  acetaminophen (TYLENOL) suppository 650 mg (has no administration in time range)  ondansetron (ZOFRAN) tablet 4 mg (has no administration in time range)    Or  ondansetron (ZOFRAN) injection 4 mg (has no administration in time range)  0.9 % NaCl with KCl 40 mEq / L  infusion ( Intravenous New Bag/Given 05/30/23 1114)  potassium chloride 10 mEq in 100 mL IVPB (has no administration in time range)  insulin aspart (novoLOG) injection 0-15 Units (has no administration in time range)  diazepam (VALIUM) injection 5 mg (has no administration in time range)  diazepam (VALIUM) injection 2.5 mg (has no administration in time range)  cefTRIAXone (ROCEPHIN) 1 g in sodium chloride 0.9 % 100 mL IVPB (has no administration in time range)  brexpiprazole (REXULTI) tablet 2 mg (has no administration in time range)  donepezil (ARICEPT) tablet 10 mg (has no administration in time range)  DULoxetine (CYMBALTA) DR capsule 90 mg (has no administration in time range)  apixaban (ELIQUIS) tablet 5 mg (has no administration in time range)  furosemide (LASIX) tablet 20 mg (has no administration in time range)  levothyroxine (SYNTHROID) tablet 50 mcg (has no administration in time range)  mirtazapine (REMERON) tablet 7.5 mg (has no administration in time range)  omega-3 acid ethyl esters (LOVAZA) capsule 1 g (has no administration in time range)  traZODone (DESYREL) tablet 100 mg (has no administration in time range)  cyanocobalamin (VITAMIN B12) tablet 500 mcg (has no administration in time range)  oxyCODONE (Oxy IR/ROXICODONE) immediate release tablet 5 mg (has no  administration in time range)  busPIRone (BUSPAR) tablet 10 mg (has no administration in time range)    And  busPIRone (BUSPAR) tablet 5 mg (has no administration in time range)  valproate (DEPACON) 1,860 mg in dextrose 5 % 50 mL IVPB (has no administration in time range)  valproate (DEPACON) 310 mg in dextrose 5 % 50 mL IVPB (has no administration in time range)  LORazepam (ATIVAN) injection 1 mg (1 mg Intravenous Given 05/30/23 0439)  levETIRAcetam (KEPPRA) IVPB 1500 mg/ 100 mL premix (0 mg Intravenous Stopped 05/30/23 0539)  cefTRIAXone (ROCEPHIN) 1 g in sodium chloride 0.9 % 100 mL IVPB (0 g Intravenous Stopped 05/30/23 0752)  gadobutrol (GADAVIST) 1 MMOL/ML injection 9 mL (9 mLs Intravenous  Contrast Given 05/30/23 1057)    Mobility manual wheelchair

## 2023-05-30 NOTE — ED Notes (Signed)
Pt brief and linen changed  

## 2023-05-30 NOTE — ED Triage Notes (Signed)
Pt bib ems from home for possible seizure like activity. Daughter states the pts eyes rolls back and she had shaking movements. Pt with a hx of breast cancer, mastectomy of the left. Family worried about mets to the brain due to dementia worsening. Pt was in remission and went for a check up last week, breast cancer is back and more aggressive. Pt is alert to baseline. DNR at bedside  EMS vitals BP 141/68 HR 94 SPO2 96% RA

## 2023-05-30 NOTE — Consult Note (Addendum)
Neurology Consultation  Reason for Consult: Seizure activity Referring Physician: Dr. Robb Matar  CC: None  History is obtained from: Family and chart  HPI: Andrea [name] is a 88 y.o. female with history of dementia, breast cancer status postmastectomy with recent recurrence, atrial fibrillation on Eliquis, DVT, depression with history of suicide attempt, anxiety, diabetes, hypothyroidism and sleep apnea not on CPAP who presents after having a seizure at about 3:00 in the morning.  Patient's daughter, who is her caregiver, noticed patient calling out and came into her room to find her with generalized body jerking and her eyes rolled in the back of her head.  The episode lasted about a minute, and patient appeared drowsy and more confused afterwards.  She had another GTC in the emergency department.  Patient has a history of breast cancer with recent recurrence, and due to aggressiveness of the tumor and patient's history of not tolerating chemotherapy, mastectomy was planned.  Patient has never had a seizure before but has had some episodes where she was using the toilet and suddenly startled and threw up her hands.  No one else in the family has seizures.  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  She has had no problems with her birth or development.   ROS: Unable to obtain due to altered mental status.   Past Medical History:  Diagnosis Date   A-fib (HCC)    Anemia    Anxiety    Arthritis    hands, spine    Bladder incontinence    Blood transfusion    Cancer (HCC) 02/2019   left breast cancer   Cervicalgia    Constipation    Cough    Wert-onset 08/2009, as of 2014- resolved    Deep venous thrombosis (HCC)    post op, rec'd/needed  blood thinner    Dementia (HCC)    Depression    Depression with anxiety    Diabetes (HCC)    Dysrhythmia    a-fib   Falls  frequently    pt. reports that she was falling 3 times a day, has had PT at a facility for a while & now is getting home PT 2-3 times/ week    Headache(784.0) 09/27/2011   History of stress test    15-20 yrs.ago    Hypotension    Hypothyroidism    Lumbar radiculopathy 09/26/2011   Pneumonia    never been in hosp. for pneumonia    Prediabetes    Right hand fracture    Monday   Shortness of breath    Sleep apnea    does not use consistently     Family History  Problem Relation Age of Onset   Heart disease Father    Malignant hyperthermia Father    Arthritis Father    Deep vein thrombosis Son    Diabetes Son    Obesity Son    Depression Son    Hypertension Son    Parkinson's disease Mother    Diabetes Daughter    Hypertension Daughter    Anxiety disorder Daughter    Hyperlipidemia Son    Hypertension Son    Cancer Maternal Aunt      Social History:   reports that she quit smoking about 2 years ago. Her smoking use included cigarettes. She has a 5.00 pack-year smoking history.  She has never used smokeless tobacco. She reports that she does not currently use alcohol. She reports that she does not use drugs.  Medications  Current Facility-Administered Medications:    0.9 % NaCl with KCl 20 mEq/ L  infusion, , Intravenous, Continuous, Bobette Mo, MD   levETIRAcetam (KEPPRA) IVPB 500 mg/100 mL premix, 500 mg, Intravenous, Q12H, Caryl Pina, MD   magnesium sulfate IVPB 2 g 50 mL, 2 g, Intravenous, Once, Bobette Mo, MD   potassium chloride SA (KLOR-CON M) CR tablet 40 mEq, 40 mEq, Oral, Once, Bobette Mo, MD  Current Outpatient Medications:    brexpiprazole (REXULTI) 2 MG TABS tablet, Take 2 mg by mouth at bedtime., Disp: , Rfl:    busPIRone (BUSPAR) 5 MG tablet, Take 5-10 mg by mouth 2 (two) times daily. 10 MG IN THE MORNING, 5 MG AT DINNER, Disp: , Rfl:    CALCIUM CITRATE PO, Take 600 mg by mouth daily. , Disp: , Rfl:    clonazePAM (KLONOPIN)  0.5 MG tablet, Take 0.25 mg by mouth as needed for anxiety., Disp: , Rfl:    D-Mannose 500 MG CAPS, Take by mouth daily., Disp: , Rfl:    donepezil (ARICEPT) 10 MG tablet, Take 1 tablet (10 mg total) by mouth daily. Take 1  tablet at 10 mg daily, Disp: 30 tablet, Rfl: 11   DULoxetine (CYMBALTA) 30 MG capsule, Take 90 mg by mouth daily., Disp: , Rfl:    ELIQUIS 5 MG TABS tablet, TAKE 1 TABLET BY MOUTH TWICE A DAY, Disp: 60 tablet, Rfl: 5   furosemide (LASIX) 20 MG tablet, TAKE 1 TABLET BY MOUTH EVERY DAY, Disp: 90 tablet, Rfl: 2   levothyroxine (SYNTHROID) 50 MCG tablet, TAKE 1 TABLET BY MOUTH DAILY BEFORE BREAKFAST., Disp: 90 tablet, Rfl: 2   memantine (NAMENDA) 5 MG tablet, TAKE 1 TABLET (5 MG AT NIGHT) FOR 2 WEEKS, THEN INCREASE TO 1 TABLET (5 MG) TWICE A DAY, Disp: 180 tablet, Rfl: 4   mirtazapine (REMERON) 7.5 MG tablet, Take 7.5 mg by mouth at bedtime., Disp: , Rfl:    Multiple Vitamins-Minerals (MULTIVITAMIN PO), Take 1 tablet by mouth daily. , Disp: , Rfl:    oxybutynin (DITROPAN-XL) 10 MG 24 hr tablet, Take 10 mg by mouth daily., Disp: , Rfl:    potassium chloride SA (KLOR-CON M20) 20 MEQ tablet, TAKE 1 TABLET BY MOUTH EVERY DAY, Disp: 90 tablet, Rfl: 1   Probiotic Product (PROBIOTIC DAILY PO), Take 1 tablet by mouth daily. OTC, Disp: , Rfl:    senna-docusate (SENOKOT-S) 8.6-50 MG tablet, Take 1 tablet by mouth 2 (two) times daily as needed for mild constipation., Disp: , Rfl:    traMADol (ULTRAM) 50 MG tablet, TAKE 1 TABLET (50 MG TOTAL) BY MOUTH EVERY 6 (SIX) HOURS AS NEEDED FOR MODERATE PAIN, Disp: 30 tablet, Rfl: 0   traZODone (DESYREL) 100 MG tablet, Take 100 mg by mouth at bedtime., Disp: , Rfl:    trimethoprim (TRIMPEX) 100 MG tablet, Take 100 mg by mouth at bedtime., Disp: , Rfl:    vitamin B-12 (CYANOCOBALAMIN) 500 MCG tablet, Take 500 mcg by mouth daily., Disp: , Rfl:    Vitamin D, Ergocalciferol, (DRISDOL) 1.25 MG (50000 UNIT) CAPS capsule, Take 50,000 Units by mouth once a  week., Disp: , Rfl:    Exam: Current vital signs: BP (!) 134/54   Pulse 82   Temp 97.6 F (36.4 C)   Resp 14   SpO2 100%  Vital signs  in last 24 hours: Temp:  [97.6 F (36.4 C)-97.8 F (36.6 C)] 97.6 F (36.4 C) (07/10 0800) Pulse Rate:  [82-119] 82 (07/10 0800) Resp:  [13-20] 14 (07/10 0800) BP: (130-146)/(54-114) 134/54 (07/10 0800) SpO2:  [97 %-100 %] 100 % (07/10 0800)  GENERAL: Drowsy, well-nourished, well-developed elderly patient in no acute distress LUNGS: Normal respiratory effort. Non-labored breathing on supplemental O2 CV: Regular rate and rhythm on telemetry Extremities: warm, well perfused, without obvious deformity  NEURO:  Mental Status: Patient is drowsy but opens eyes to loud voice or touch.  She is able to intermittently follow commands but cannot state her name, place or situation. Speech/Language: speech is dysarthric, but patient does not have dentures at this time.  Speech is in single words and short phrases Cranial Nerves:  II: PERRL III, IV, VI: EOMI. Lid elevation symmetric and full.  VII: Face is symmetric resting  VIII: Hearing intact to voice IX, X: Gag reflex deferred  XII: Tongue protrudes midline without fasciculations.   Motor: Patient is able to move bilateral upper extremities and left lower extremity with good antigravity strength to command. Arms did remain elevated for about 30 seconds after passive elevation and release by examiner on repeat exam in the evening when she was less easily arousable. Does not move right lower extremity to command but will move it to noxious.  Tone is normal. Bulk is normal.  Sensation: Appears intact to light touch Coordination: Unable to perform Gait: Deferred   Labs I have reviewed labs in epic and the results pertinent to this consultation are:   CBC    Component Value Date/Time   WBC 12.8 (H) 05/30/2023 0438   RBC 4.29 05/30/2023 0438   HGB 11.0 (L) 05/30/2023 0438   HGB 9.6 (L) 04/22/2020  1240   HGB 13.2 09/15/2015 0816   HCT 37.2 05/30/2023 0438   HCT 40.2 09/15/2015 0816   PLT 348 05/30/2023 0438   PLT 313 04/22/2020 1240   PLT 199 09/15/2015 0816   MCV 86.7 05/30/2023 0438   MCV 89 09/15/2015 0816   MCH 25.6 (L) 05/30/2023 0438   MCHC 29.6 (L) 05/30/2023 0438   RDW 15.8 (H) 05/30/2023 0438   RDW 14.3 09/15/2015 0816   LYMPHSABS 3.3 05/30/2023 0438   LYMPHSABS 2.1 09/15/2015 0816   MONOABS 0.8 05/30/2023 0438   EOSABS 0.1 05/30/2023 0438   EOSABS 0.2 09/15/2015 0816   BASOSABS 0.1 05/30/2023 0438   BASOSABS 0.0 09/15/2015 0816    CMP     Component Value Date/Time   NA 139 05/30/2023 0438   NA 136 (A) 12/18/2019 1500   K 3.1 (L) 05/30/2023 0438   CL 104 05/30/2023 0438   CO2 17 (L) 05/30/2023 0438   GLUCOSE 161 (H) 05/30/2023 0438   BUN 20 05/30/2023 0438   BUN 12 12/18/2019 1500   CREATININE 1.11 (H) 05/30/2023 0438   CREATININE 1.00 (H) 11/27/2022 1027   CALCIUM 9.1 05/30/2023 0438   PROT 7.0 05/30/2023 0438   PROT 6.2 12/20/2015 1357   ALBUMIN 3.4 (L) 05/30/2023 0438   ALBUMIN 4.2 12/20/2015 1357   AST 22 05/30/2023 0438   AST 14 (L) 04/22/2020 1240   ALT 11 05/30/2023 0438   ALT 7 04/22/2020 1240   ALKPHOS 61 05/30/2023 0438   BILITOT 0.3 05/30/2023 0438   BILITOT 0.3 04/22/2020 1240   GFRNONAA 49 (L) 05/30/2023 0438   GFRNONAA 57 (L) 05/24/2021 1149   GFRAA 66 05/24/2021 1149    Lipid  Panel     Component Value Date/Time   CHOL 204 (H) 05/25/2022 0944   CHOL 171 04/24/2016 0928   TRIG 115 05/25/2022 0944   HDL 59 05/25/2022 0944   HDL 58 04/24/2016 0928   CHOLHDL 3.5 05/25/2022 0944   VLDL 28 03/01/2017 0820   LDLCALC 122 (H) 05/25/2022 0944     Imaging I have reviewed the images obtained:  CT-scan of the brain: No acute abnormality, chronic microvascular ischemic changes and atrophy  MRI examination of the brain with and without contrast: Pending  Assessment: 88 year old patient with history of dementia, breast cancer  status postmastectomy with recent recurrence, atrial fibrillation on Eliquis, DVT, depression with history of suicide attempt, anxiety, diabetes, hypothyroidism and sleep apnea not on CPAP presents with 2 witnessed seizures.  Patient's mental status has not returned to baseline, possibly due to combination of Ativan administered, postictal state and suspected UTI.  While brain metastases from her breast cancer are the most pressing concern, she does certainly have other seizure risk factors, such as dementia, head injuries and history of encephalitis when she was younger.  Will obtain brain MRI with and without contrast to evaluate for brain metastases.  Impression: New onset seizure activity in patient with cancer and dementia  Recommendations: -Brain MRI with and without contrast -EEG -Change Keppra to Depakote given history of depression with SI, load with 20 mg/kg then 10 mg/kg/day -Lorazepam IV 2 to 4 mg for further seizure activity -Maintain seizure precautions -TSH level. Restart synthroid.  -Restart Eliquis if no medical contraindication  Addendum: EEG report: Periodic discharges with triphasic morphology, generalized (GPDs); Continuous slow, generalized. This study is suggestive of moderate to severe diffuse encephalopathy, nonspecific to etiology but likely related to toxic-metabolic causes. No seizures or definite epileptiform discharges were seen throughout the recording.  Pt seen by NP/Neuro and later by MD. Note/plan to be edited by MD as needed.  Cortney E Ernestina Columbia , MSN, AGACNP-BC [facility] See Amion for schedule and pager information 05/30/2023 9:31 AM  I have seen and examined the patient. I have formulated the assessment and recommendations. 88 year old patient with history of dementia, breast cancer status postmastectomy with recent recurrence, atrial fibrillation on Eliquis, DVT, depression with history of suicide attempt, anxiety, diabetes, hypothyroidism  and sleep apnea not on CPAP presents with 2 witnessed seizures.  Patient's mental status has not returned to baseline, possibly due to combination of Ativan administered, postictal state and suspected UTI. Exam reveals a somnolent patient with minimal verbal output who is only able to follow some commands. Recommendations as above.  Electronically signed: Dr. Caryl Pina

## 2023-05-31 DIAGNOSIS — R569 Unspecified convulsions: Secondary | ICD-10-CM | POA: Diagnosis not present

## 2023-05-31 LAB — COMPREHENSIVE METABOLIC PANEL
ALT: 10 U/L (ref 0–44)
AST: 15 U/L (ref 15–41)
Albumin: 2.6 g/dL — ABNORMAL LOW (ref 3.5–5.0)
Alkaline Phosphatase: 45 U/L (ref 38–126)
Anion gap: 6 (ref 5–15)
BUN: 14 mg/dL (ref 8–23)
CO2: 25 mmol/L (ref 22–32)
Calcium: 7.8 mg/dL — ABNORMAL LOW (ref 8.9–10.3)
Chloride: 107 mmol/L (ref 98–111)
Creatinine, Ser: 0.81 mg/dL (ref 0.44–1.00)
GFR, Estimated: >60 mL/min (ref >60)
Glucose, Bld: 96 mg/dL (ref 70–99)
Potassium: 3.7 mmol/L (ref 3.5–5.1)
Sodium: 138 mmol/L (ref 135–145)
Total Bilirubin: 0.4 mg/dL (ref 0.3–1.2)
Total Protein: 5.3 g/dL — ABNORMAL LOW (ref 6.5–8.1)

## 2023-05-31 LAB — CBC
HCT: 26.9 % — ABNORMAL LOW (ref 36.0–46.0)
Hemoglobin: 8.4 g/dL — ABNORMAL LOW (ref 12.0–15.0)
MCH: 26 pg (ref 26.0–34.0)
MCHC: 31.2 g/dL (ref 30.0–36.0)
MCV: 83.3 fL (ref 80.0–100.0)
Platelets: 223 10*3/uL (ref 150–400)
RBC: 3.23 MIL/uL — ABNORMAL LOW (ref 3.87–5.11)
RDW: 15.6 % — ABNORMAL HIGH (ref 11.5–15.5)
WBC: 5.8 10*3/uL (ref 4.0–10.5)
nRBC: 0 % (ref 0.0–0.2)

## 2023-05-31 LAB — RETICULOCYTES
Immature Retic Fract: 9.3 % (ref 2.3–15.9)
RBC.: 3.87 MIL/uL (ref 3.87–5.11)
Retic Count, Absolute: 66.6 10*3/uL (ref 19.0–186.0)
Retic Ct Pct: 1.7 % (ref 0.4–3.1)

## 2023-05-31 LAB — VITAMIN B12: Vitamin B-12: 1811 pg/mL — ABNORMAL HIGH (ref 180–914)

## 2023-05-31 LAB — GLUCOSE, CAPILLARY
Glucose-Capillary: 78 mg/dL (ref 70–99)
Glucose-Capillary: 58 mg/dL — ABNORMAL LOW (ref 70–99)
Glucose-Capillary: 79 mg/dL (ref 70–99)
Glucose-Capillary: 90 mg/dL (ref 70–99)
Glucose-Capillary: 90 mg/dL (ref 70–99)

## 2023-05-31 MED ORDER — TRAZODONE HCL 50 MG PO TABS
50.0000 mg | ORAL_TABLET | Freq: Every day | ORAL | Status: DC
  Administered 2023-05-31 – 2023-06-02 (×3): 50 mg via ORAL
  Filled 2023-05-31 (×3): qty 1

## 2023-05-31 MED ORDER — SODIUM CHLORIDE 0.9 % IV SOLN: INTRAVENOUS | Status: DC

## 2023-05-31 NOTE — Progress Notes (Signed)
PROGRESS NOTE    Andrea [name]  WUJ:[id number] DOB: [birth year] DOA: 05/30/2023 PCP: Caesar Bookman, NP   Brief Narrative: 88 y.o. female with medical history significant of atrial fibrillation, anemia, anxiety, depression, Alzheimer's dementia, type 2 diabetes, incontinence, constipation, cervicalgia, headaches, hypothyroidism, hypertension, lumbar radiculopathy, history of pneumonia, prediabetes, sleep apnea not on CPAP, cervical cancer, history of left mastectomy who was brought to the emergency department due to worsening dementia and seizures.  Her daughter stated that she was awake around 0300 when she heard her mother made a noise.  She went to her room, found her seizing, rolling her eyes for about a minute and then became postictal.  She stated that for the last few months her dementia has been getting worse.  She used to have a nap in the morning and then nap in the afternoon, but lately she has not been taking her afternoon nap and instead has been showing sundowning symptoms.  She was seen recently by neurology who started her on memantine, but the patient did not tolerated well as she was very somnolent next day after starting with the night dose, so in that month he has not been continued by her family.  She has had good appetite.  No fever, URI symptoms, cough, emesis or diarrhea.  However, the patient is not able to fully express herself when it comes to her symptoms, given her advanced dementia.   Lab work: Her urine analysis was cloudy with ketones of 5 and protein of 100 mg/dL.[name]  There was large leukocyte esterase, negative nitrite, RBC 11-20, WBC more than 50, few bacteria, positive WBC clumps, mucus, budding yeast and hyaline casts.  CBCs are white count 12.8 with 64% neutrophils, 26% lymphocytes and 7% monocytes.  Hemoglobin 11.0 g/dL and platelets 956.  CMP showed a potassium of 3.1 and CO2 of 17 mmol/L with an anion gap of 18.  The rest of the electrolytes were normal.  Glucose  161, BUN 20 and creatinine 1.11 mg/dL.  LFTs were normal, except for an albumin level 3.4 g/dL.   Imaging: CT head without contrast showing no acute intracranial normalities.  There is moderate cerebral and mild cerebellar atrophy with extensive chronic microvascular ischemic changes in the cerebral white matter.   ED course: Initial vital signs were temperature 97.8 F, pulse 88, respiration 20, BP 130/81 mmHg O2 sat 97% on room air.  The patient received ceftriaxone 1 g IVPB, Keppra 1500 mg IVPB and lorazepam 1 mg IVP.  The case was discussed with neurology who will see the patient Wonda Olds, recommended EEG and MRI of brain.    Assessment & Plan:   Principal Problem:   Seizures (HCC) Active Problems:   DEPRESSION/ANXIETY   Lower urinary tract infectious disease   Chronic pain associated with significant psychosocial dysfunction   Essential (primary) hypertension   Acquired hypothyroidism   OSA on CPAP   Paroxysmal A-fib (HCC)   Hyperlipidemia   Chronic deep vein thrombosis (DVT) of proximal vein of both lower extremities (HCC)   Normocytic anemia   Type 2 diabetes mellitus with hyperglycemia (HCC)   Metabolic acidosis  #1 new onset seizures grand mal in the setting of dementia, prior history of encephalitis, breast cancer ?  UTI MRI brain no acute intracranial abnormality or mass.  Bilateral hippocampal atrophy.  Severe chronic small vessel disease. EEG moderate to severe diffuse encephalopathy. Keppra changed to Depakote by neurology As needed Ativan PT evaluation Will stop tramadol in view of new onset  seizures as it decreases the threshold   #2 UTI UA with ketones and large amount of leukocyte I did not see a urine culture that is pending. Will order urine culture by itself. She takes trimethoprim prophylactically prior to admission will restart on discharge. Leukocytosis resolved  #3 depression and anxiety continue home meds trazodone duloxetine and mirtazapine  #4  essential hypertension on Lasix I will hold Lasix since she appears dry with urine ketones. Will give 1 bag of normal saline.  #5 hypothyroidism on Synthroid last TSH done in April 2024 stable  #6 obstructive sleep apnea patient has not using CPAP  #7 paroxysmal atrial fibrillation on Eliquis  #8 DVT on Eliquis  #9 type 2 diabetes on ssi A1c 5.2  #10 hypokalemia resolved  #11 anemia of chronic disease hemoglobin 8.4 from 11 on admission in 24 hours.  No obvious signs of bleeding noted.  Will check FOBT and iron panel.   Estimated body mass index is 35.32 kg/m as calculated from the following:   Height as of this encounter: 5\' 2"  (1.575 m).   Weight as of this encounter: 87.6 kg.  DVT prophylaxis: Eliquis  code Status: DNR Family Communication: None at bedside Disposition Plan:  Status is: Inpatient Remains inpatient appropriate because: New onset seizures   Consultants:  Neurology  Procedures: None Antimicrobials: Rocephin  Subjective: She is resting in bed in no acute distress with eyes open she is not following any commands or answering questions appropriately  Objective: Vitals:   05/30/23 1737 05/30/23 2007 05/31/23 0019 05/31/23 0500  BP: (!) 129/59 (!) 125/49 (!) 101/43 (!) 130/57  Pulse: 66 66 65 68  Resp: 20 20 18    Temp: 98.5 F (36.9 C) 98.4 F (36.9 C) 98.6 F (37 C) 97.9 F (36.6 C)  TempSrc: Axillary Oral Oral Oral  SpO2: 93% 99% 96% 100%  Weight:      Height:        Intake/Output Summary (Last 24 hours) at 05/31/2023 0957 Last data filed at 05/31/2023 0838 Gross per 24 hour  Intake 794.86 ml  Output 2300 ml  Net -1505.14 ml   Filed Weights   05/30/23 1105 05/30/23 1140 05/30/23 1700  Weight: 93 kg 93 kg 87.6 kg    Examination:  General exam: Appears chronic  ill-appearing and frail Respiratory system: Clear to auscultation. Respiratory effort normal. Cardiovascular system: S1 & S2 heard, RRR. No JVD, murmurs, rubs, gallops or clicks.  No pedal edema. Gastrointestinal system: Abdomen is nondistended, soft and nontender. No organomegaly or masses felt. Normal bowel sounds heard. Central nervous system: Awake does not follow any commands extremities: Trace edema   Data Reviewed: I have personally reviewed following labs and imaging studies  CBC: Recent Labs  Lab 05/30/23 0438 05/31/23 0345  WBC 12.8* 5.8  NEUTROABS 8.4*  --   HGB 11.0* 8.4*  HCT 37.2 26.9*  MCV 86.7 83.3  PLT 348 223   Basic Metabolic Panel: Recent Labs  Lab 05/30/23 0438 05/31/23 0345  NA 139 138  K 3.1* 3.7  CL 104 107  CO2 17* 25  GLUCOSE 161* 96  BUN 20 14  CREATININE 1.11* 0.81  CALCIUM 9.1 7.8*  MG 2.0  --   PHOS 3.0  --    GFR: Estimated Creatinine Clearance: 52.2 mL/min (by C-G formula based on SCr of 0.81 mg/dL). Liver Function Tests: Recent Labs  Lab 05/30/23 0438 05/31/23 0345  AST 22 15  ALT 11 10  ALKPHOS 61 45  BILITOT  0.3 0.4  PROT 7.0 5.3*  ALBUMIN 3.4* 2.6*   No results for input(s): "LIPASE", "AMYLASE" in the last 168 hours. No results for input(s): "AMMONIA" in the last 168 hours. Coagulation Profile: No results for input(s): "INR", "PROTIME" in the last 168 hours. Cardiac Enzymes: No results for input(s): "CKTOTAL", "CKMB", "CKMBINDEX", "TROPONINI" in the last 168 hours. BNP (last 3 results) No results for input(s): "PROBNP" in the last 8760 hours. HbA1C: No results for input(s): "HGBA1C" in the last 72 hours. CBG: Recent Labs  Lab 06-04-2023 0433 2023-06-04 1143 06-04-23 1712 06/04/23 2012 05/31/23 0736  GLUCAP 145* 109* 124* 123* 78   Lipid Profile: No results for input(s): "CHOL", "HDL", "LDLCALC", "TRIG", "CHOLHDL", "LDLDIRECT" in the last 72 hours. Thyroid Function Tests: No results for input(s): "TSH", "T4TOTAL", "FREET4", "T3FREE", "THYROIDAB" in the last 72 hours. Anemia Panel: No results for input(s): "VITAMINB12", "FOLATE", "FERRITIN", "TIBC", "IRON", "RETICCTPCT" in the last 72  hours. Sepsis Labs: No results for input(s): "PROCALCITON", "LATICACIDVEN" in the last 168 hours.  No results found for this or any previous visit (from the past 240 hour(s)).       Radiology Studies: EEG adult  Result Date: June 04, 2023 Charlsie Quest, MD     06/04/23  9:42 PM Patient Name: Andrea [name] MRN: [id number] Epilepsy Attending: Charlsie Quest Referring Physician/Provider: Caryl Pina, MD Date: 06-04-23 Duration: 24.36 mins Patient history: 88 yo F presents after having a seizure at about 3:00 in the morning. EEG to evaluate for seizure. Level of alertness:  lethargic AEDs during EEG study: VPA Technical aspects: This EEG study was done with scalp electrodes positioned according to the 10-20 International system of electrode placement. Electrical activity was reviewed with band pass filter of 1-70Hz , sensitivity of 7 uV/mm, display speed of 48mm/sec with a 60Hz  notched filter applied as appropriate. EEG data were recorded continuously and digitally stored.  Video monitoring was available and reviewed as appropriate. Description: EEG showed continuous generalized 3 to 6 Hz theta-delta slowing. Generalized periodic discharges with triphasic morphology at 0.5-1 Hz, more prominent when awake/stimulated were also noted. Hyperventilation and photic stimulation were not performed.   ABNORMALITY - Periodic discharges with triphasic morphology, generalized ( GPDs) - Continuous slow, generalized IMPRESSION: This study is suggestive of moderate to severe diffuse encephalopathy, nonspecific etiology but likely related to toxic-metabolic causes. No seizures or definite epileptiform discharges were seen throughout the recording. Charlsie Quest   MR BRAIN W WO CONTRAST  Result Date: 06-04-23 CLINICAL DATA:  Seizure disorder, clinical change. EXAM: MRI HEAD WITHOUT AND WITH CONTRAST TECHNIQUE: Multiplanar, multiecho pulse sequences of the brain and surrounding structures were obtained  without and with intravenous contrast. CONTRAST:  9mL GADAVIST GADOBUTROL 1 MMOL/ML IV SOLN COMPARISON:  MRI brain 11/11/2021.  Head CT 06-04-23. FINDINGS: Brain: Motion degraded study. No acute infarct or hemorrhage. Background of severe chronic small-vessel disease, unchanged. No hydrocephalus or extra-axial collection. No foci of abnormal susceptibility. No mass or abnormal enhancement. Bilateral hippocampal atrophy. Otherwise normal hippocampal signal. No evidence of cortical dysgenesis. Vascular: Normal flow voids and vessel enhancement. Skull and upper cervical spine: Normal marrow signal and enhancement. Sinuses/Orbits: Unremarkable. Other: None. IMPRESSION: 1. No acute intracranial abnormality or mass. 2. Bilateral hippocampal atrophy. 3. Severe chronic small-vessel disease. Electronically Signed   By: Orvan Falconer M.D.   On: 04-Jun-2023 11:43   CT HEAD WO CONTRAST  Result Date: 04-Jun-2023 CLINICAL DATA:  88 year old female with history of new onset of seizure. History of left-sided breast cancer.  EXAM: CT HEAD WITHOUT CONTRAST TECHNIQUE: Contiguous axial images were obtained from the base of the skull through the vertex without intravenous contrast. RADIATION DOSE REDUCTION: This exam was performed according to the departmental dose-optimization program which includes automated exposure control, adjustment of the mA and/or kV according to patient size and/or use of iterative reconstruction technique. COMPARISON:  Head CT 09/27/2022. FINDINGS: Brain: Moderate cerebral and mild cerebellar atrophy. Patchy and confluent areas of decreased attenuation are noted throughout the deep and periventricular white matter of the cerebral hemispheres bilaterally, compatible with chronic microvascular ischemic disease. No evidence of acute infarction, hemorrhage, hydrocephalus, extra-axial collection or mass lesion/mass effect. Vascular: No hyperdense vessel or unexpected calcification. Skull: Normal. Negative for  fracture or focal lesion. Sinuses/Orbits: No acute finding. Other: None. IMPRESSION: 1. No acute intracranial abnormalities. 2. Moderate cerebral and mild cerebellar atrophy with extensive chronic microvascular ischemic changes in the cerebral white matter, as above, similar to prior studies. Electronically Signed   By: Trudie Reed M.D.   On: 05/30/2023 05:52        Scheduled Meds:  apixaban  5 mg Oral BID   brexpiprazole  2 mg Oral QHS   busPIRone  10 mg Oral Daily   And   busPIRone  5 mg Oral QHS   Chlorhexidine Gluconate Cloth  6 each Topical Daily   cyanocobalamin  500 mcg Oral Daily   donepezil  10 mg Oral Daily   DULoxetine  90 mg Oral Daily   furosemide  20 mg Oral Daily   insulin aspart  0-15 Units Subcutaneous TID WC   levothyroxine  50 mcg Oral QAC breakfast   mirtazapine  7.5 mg Oral QHS   omega-3 acid ethyl esters  1 g Oral QPM   potassium chloride  40 mEq Oral Once   sodium chloride flush  10-40 mL Intracatheter Q12H   traZODone  100 mg Oral QHS   Continuous Infusions:  cefTRIAXone (ROCEPHIN)  IV 1 g (05/31/23 0854)   valproate sodium 310 mg (05/31/23 0606)     LOS: 1 day   Time spent: 38 min  Alwyn Ren, MD 05/31/2023, 9:57 AM

## 2023-05-31 NOTE — Inpatient Diabetes Management (Signed)
Inpatient Diabetes Program Recommendations  AACE/ADA: New Consensus Statement on Inpatient Glycemic Control (2015)  Target Ranges:  Prepandial:   less than 140 mg/dL      Peak postprandial:   less than 180 mg/dL (1-2 hours)      Critically ill patients:  140 - 180 mg/dL   Lab Results  Component Value Date   GLUCAP 79 05/31/2023   HGBA1C 5.2 11/27/2022    Review of Glycemic Control  Latest Reference Range & Units 05/30/23 17:12 05/30/23 20:12 05/31/23 07:36 05/31/23 11:40 05/31/23 12:24  Glucose-Capillary 70 - 99 mg/dL 161 (H) 096 (H) 78 58 (L) 79   Diabetes history: DM 2 Outpatient Diabetes medications:  No DM meds listed Current orders for Inpatient glycemic control:  Novolog moderate tid with meals   Inpatient Diabetes Program Recommendations:    Low blood sugar- No insulin has been given. Consider reducing Novolog correction to "Very Sensitive" 0-6 units tid with meals or either hold insulin for now (does not appear to need).   Thanks,  Beryl Meager, RN, BC-ADM Inpatient Diabetes Coordinator Pager [phone]  (8a-5p)

## 2023-05-31 NOTE — Plan of Care (Signed)
EEG yesterday suggestive of moderate to severe diffuse encephalopathy, likely due to toxic metabolic causes with no seizures or epileptiform discharges seen. MRI brain demonstrates no acute intracranial abnormality or mass. Suspect that any ongoing altered mental status is due to toxic metabolic encephalopathy rather than seizure activity.  Suspect that episode of staring earlier today was caused by patient's reported hypoglycemia.  Neurology will sign off, but please contact us if any further questions or concerns arise.  E Ernestina Columbia , MSN, AGACNP-BC [facility] See Amion for schedule and pager information 05/31/2023 2:00 PM

## 2023-05-31 NOTE — Telephone Encounter (Signed)
   Name: Andrea [name]  DOB: [birth year]  MRN: [id number]   Primary Cardiologist: None  Chart reviewed as part of pre-operative protocol coverage.   We are asked for eliquis hold for mastectomy. Per our PharmD: Per office protocol, patient can hold Eliquis for 2 days prior to procedure.   Patient will not need bridging with Lovenox (enoxaparin) around procedure.  I will route this recommendation to the requesting party via Epic fax function and remove from pre-op pool. Please call with questions.  Roe Rutherford Darnisha Vernet, PA 05/31/2023, 11:44 AM

## 2023-05-31 NOTE — Progress Notes (Signed)
During assessment this RN saw the patient was staring at the wall and not responding to the RN and family member.MD notified and stated it could be seizure and MD called family member..Also CN notified.MD put new orders.See order history.

## 2023-05-31 NOTE — Progress Notes (Signed)
At 11:40 patient's blood sugar was 58.Ensure was given to the patient.Rechecked the blood sugar.It was 79.MD notified.

## 2023-05-31 NOTE — TOC Initial Note (Signed)
Transition of Care [facility]) - Initial/Assessment Note    Patient Details  Name: Andrea [name] MRN: [id number] Date of Birth: 11-26-1934  [facility] CM/SW Contact:    Howell Rucks, RN Phone Number: 05/31/2023, 10:26 AM  Clinical Narrative:  Met with pt's dtr [name]) and caregiver (Vicky) at bedside, pt with hx of Dementia per dtr, pt sleeping during assessment. Bobbi confirmed pt has PCP and pharmacy in place, no home care services at this time,  pt has has a M-F caregiver(Vicky), pt resides with [place] who is her fulltime caregiver . Per Peck, pt normally nonambulatory, able to take a few steps forward and backward to get onto bed and toilet. Karen Kitchens provides transportation for pt. Bobbie expressed concerns regarding pt's current medical status, NCM advised Bobbi to speak with attending when he makes rounds, Bobbi voiced understanding, no further questions/concerns. TOC will continue to follow.   Expected Discharge Plan: Home/Self Care Barriers to Discharge: Continued Medical Work up   Patient Goals and CMS Choice Patient states their goals for this hospitalization and ongoing recovery are:: Home with dtr, has M-F caregiver          Expected Discharge Plan and Services       Living arrangements for the past 2 months: Single Family Home                                      Prior Living Arrangements/Services Living arrangements for the past 2 months: Single Family Home Lives with:: Adult Children Patient language and need for interpreter reviewed:: Yes Do you feel safe going back to the place where you live?: Yes      Need for Family Participation in Patient Care: Yes (Comment) Care giver support system in place?: Yes (comment) Current home services: Other (comment) (Home caregiver M-F) Criminal Activity/Legal Involvement Pertinent to Current Situation/Hospitalization: No - Comment as needed  Activities of Daily Living Home Assistive Devices/Equipment:  Wheelchair ADL Screening (condition at time of admission) Patient's cognitive ability adequate to safely complete daily activities?: Yes Is the patient deaf or have difficulty hearing?: Yes Does the patient have difficulty seeing, even when wearing glasses/contacts?: Yes Does the patient have difficulty concentrating, remembering, or making decisions?: Yes Patient able to express need for assistance with ADLs?: No Does the patient have difficulty dressing or bathing?: Yes Independently performs ADLs?: No Does the patient have difficulty walking or climbing stairs?: Yes Weakness of Legs: Both Weakness of Arms/Hands: Both  Permission Sought/Granted Permission sought to share information with : Case Manager Permission granted to share information with : Yes, Verbal Permission Granted  Share Information with NAME: Fannie Knee, RN           Emotional Assessment Appearance:: Appears stated age Attitude/Demeanor/Rapport: Other (comment) (pt sleeping during assessment) Affect (typically observed): Other (comment) (pt sleeping during assessment) Orientation: :  (Hx Alzheimers Dementia) Alcohol / Substance Use: Not Applicable Psych Involvement: No (comment)  Admission diagnosis:  Seizure (HCC) [R56.9] Seizures (HCC) [R56.9] Patient Active Problem List   Diagnosis Date Noted   Type 2 diabetes mellitus with hyperglycemia (HCC) 05/30/2023   Metabolic acidosis 05/30/2023   Malignant tumor of breast (HCC) 05/30/2023   Pneumonia 09/27/2022   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 (HCC) 01/19/2022   Seizures (HCC) 11/25/2020   Periprosthetic fracture of hip 08/08/2019   Right femoral fracture (HCC) 07/25/2019   Personal history of  fall 07/03/2019   Cigarette nicotine dependence without complication 07/03/2019   Chronic right-sided low back pain with right-sided sciatica 07/03/2019   Overactive bladder 07/03/2019   Normocytic anemia 07/03/2019    Malignant neoplasm of upper-outer quadrant of left breast in female, estrogen receptor positive (HCC) 03/12/2019   Chronic deep vein thrombosis (DVT) of proximal vein of both lower extremities (HCC) 10/24/2018   Hypercoagulable state due to atrial fibrillation (HCC) 10/18/2017   Diabetes mellitus (HCC) 10/18/2017   Numbness of toes 11/27/2016   Advanced care planning/counseling discussion 04/20/2016   Obesity 04/20/2016   Depression 04/20/2016   Senile osteopenia 04/20/2016   Paroxysmal A-fib (HCC) 04/20/2016   Hyperlipidemia 04/20/2016   OSA on CPAP 10/12/2014   Raynaud phenomenon 10/12/2014   Chronic pain syndrome 10/12/2014   Mixed urge and stress incontinence 10/12/2014   Urinary incontinence, mixed 10/12/2014   Snoring 06/19/2014   Hypersomnia with sleep apnea, unspecified 06/19/2014   Nocturia more than twice per night 06/19/2014   Palpitations 06/10/2014   Urinary incontinence 06/10/2014   Scoliosis 06/10/2014   Hyperglycemia 03/05/2014   Insomnia 03/05/2014   Chronically dry eyes 02/12/2014   Insomnia due to anxiety and fear 02/12/2014   Hallucination 01/18/2012   Lower urinary tract infectious disease 01/18/2012   Headache(784.0) 09/27/2011   H/O: encephalitis 09/27/2011   Hyperlipidemia associated with type 2 diabetes mellitus (HCC) 09/26/2011   Lumbar radiculopathy 09/26/2011   Muscle spasm of back 09/26/2011   Visual disturbance 09/26/2011   Falls frequently 09/25/2011   CVD (cerebrovascular disease) 09/25/2011   Hyponatremia 09/25/2011   Obesity, morbid (HCC) 09/25/2011   Delirium due to general medical condition 09/25/2011   Long term (current) use of anticoagulants 02/10/2011   Nonspecific (abnormal) findings on radiological and other examination of body structure 05/05/2010   ABNORMAL LUNG XRAY 05/05/2010   Edema 04/21/2010   DEPRESSION/ANXIETY 03/02/2010   HYPOTENSION 03/02/2010   DYSPNEA 03/02/2010   Cough 03/02/2010   CHEST PAIN 03/02/2010    Arthropathia 08/06/2009   Bipolar I disorder, single manic episode (HCC) 08/06/2009   Chronic pain associated with significant psychosocial dysfunction 08/06/2009   Clinical depression 08/06/2009   Adiposity 08/06/2009   Essential (primary) hypertension 08/06/2009   Acquired hypothyroidism 08/06/2009   PCP:  Caesar Bookman, NP Pharmacy:   [facility] [phone] - [place], [place] - [address] AT [address] OF [facility] [address] [place] [address] Phone: [phone] Fax: [phone]  [id number] [place] [place] - [address] AT [address]NER OF [facility] [address] [place] [address] Phone: [phone] Fax: [phone]     Social Determinants of Health (SDOH) Social History: SDOH Screenings   Food Insecurity: No Food Insecurity (05/30/2023)  Housing: Low Risk  (05/30/2023)  Transportation Needs: No Transportation Needs (05/30/2023)  Utilities: Not At Risk (05/30/2023)  Alcohol Screen: Low Risk  (10/24/2018)  Depression (PHQ2-9): Low Risk  (01/17/2023)  Financial Resource Strain: Low Risk  (12/03/2017)  Physical Activity: Inactive (12/03/2017)  Social Connections: Unknown (07/26/2022)   Received from [facility]  Stress: No Stress Concern Present (12/03/2017)  Tobacco Use: Medium Risk (05/30/2023)   SDOH Interventions:     Readmission Risk Interventions    05/31/2023   10:23 AM 09/30/2022   11:48 AM  Readmission Risk Prevention Plan  Transportation Screening Complete Complete  PCP or Specialist Appt within 5-7 Days  Complete  PCP or Specialist Appt within 3-5 Days Complete   Home Care Screening  Complete  Medication Review (RN  CM)  Complete  HRI or Home Care Consult Complete   Social Work Consult for Recovery Care Planning/Counseling Complete   Palliative Care Screening Not Applicable   Medication Review Oceanographer) Complete

## 2023-05-31 NOTE — Plan of Care (Signed)
  Problem: Fluid Volume: Goal: Ability to maintain a balanced intake and output will improve Outcome: Progressing   Problem: Metabolic: Goal: Ability to maintain appropriate glucose levels will improve Outcome: Progressing   Problem: Clinical Measurements: Goal: Will remain free from infection Outcome: Progressing Goal: Respiratory complications will improve Outcome: Progressing

## 2023-05-31 NOTE — Telephone Encounter (Signed)
Patient with diagnosis of atrial fibrillation on Eliquis for anticoagulation.    Procedure: mastectomy Date of procedure: TBD   CHA2DS2-VASc Score = 5   This indicates a 7.2% annual risk of stroke. The patient's score is based upon: CHF History: 0 HTN History: 0 Diabetes History: 1 Stroke History: 0 Vascular Disease History: 1 Age Score: 2 Gender Score: 1  Patient chart notes chronic DVT  CrCl 75 Platelet count 223  Per office protocol, patient can hold Eliquis for 2 days prior to procedure.   Patient will not need bridging with Lovenox (enoxaparin) around procedure.  **This guidance is not considered finalized until pre-operative APP has relayed final recommendations.**

## 2023-06-01 DIAGNOSIS — R569 Unspecified convulsions: Secondary | ICD-10-CM | POA: Diagnosis not present

## 2023-06-01 LAB — CBC
HCT: 28.4 % — ABNORMAL LOW (ref 36.0–46.0)
Hemoglobin: 8.8 g/dL — ABNORMAL LOW (ref 12.0–15.0)
MCH: 25.7 pg — ABNORMAL LOW (ref 26.0–34.0)
MCHC: 31 g/dL (ref 30.0–36.0)
MCV: 82.8 fL (ref 80.0–100.0)
Platelets: 246 10*3/uL (ref 150–400)
RBC: 3.43 MIL/uL — ABNORMAL LOW (ref 3.87–5.11)
RDW: 15.6 % — ABNORMAL HIGH (ref 11.5–15.5)
WBC: 5.7 10*3/uL (ref 4.0–10.5)
nRBC: 0 % (ref 0.0–0.2)

## 2023-06-01 LAB — FERRITIN: Ferritin: 16 ng/mL (ref 11–307)

## 2023-06-01 LAB — COMPREHENSIVE METABOLIC PANEL
ALT: 9 U/L (ref 0–44)
AST: 14 U/L — ABNORMAL LOW (ref 15–41)
Albumin: 2.6 g/dL — ABNORMAL LOW (ref 3.5–5.0)
Alkaline Phosphatase: 45 U/L (ref 38–126)
Anion gap: 7 (ref 5–15)
BUN: 13 mg/dL (ref 8–23)
CO2: 26 mmol/L (ref 22–32)
Calcium: 8.2 mg/dL — ABNORMAL LOW (ref 8.9–10.3)
Chloride: 106 mmol/L (ref 98–111)
Creatinine, Ser: 0.67 mg/dL (ref 0.44–1.00)
GFR, Estimated: >60 mL/min (ref >60)
Glucose, Bld: 93 mg/dL (ref 70–99)
Potassium: 3.3 mmol/L — ABNORMAL LOW (ref 3.5–5.1)
Sodium: 139 mmol/L (ref 135–145)
Total Bilirubin: 0.4 mg/dL (ref 0.3–1.2)
Total Protein: 5.6 g/dL — ABNORMAL LOW (ref 6.5–8.1)

## 2023-06-01 LAB — IRON AND TIBC
Iron: 18 ug/dL — ABNORMAL LOW (ref 28–170)
Saturation Ratios: 6 % — ABNORMAL LOW (ref 10.4–31.8)
TIBC: 283 ug/dL (ref 250–450)
UIBC: 265 ug/dL

## 2023-06-01 LAB — URINE CULTURE: Culture: NO GROWTH

## 2023-06-01 LAB — GLUCOSE, CAPILLARY
Glucose-Capillary: 82 mg/dL (ref 70–99)
Glucose-Capillary: 93 mg/dL (ref 70–99)
Glucose-Capillary: 88 mg/dL (ref 70–99)
Glucose-Capillary: 101 mg/dL — ABNORMAL HIGH (ref 70–99)

## 2023-06-01 LAB — HEMOGLOBIN A1C
Hgb A1c MFr Bld: 5.4 % (ref 4.8–5.6)
Mean Plasma Glucose: 108 mg/dL

## 2023-06-01 LAB — FOLATE: Folate: 14.9 ng/mL (ref >5.9)

## 2023-06-01 MED ORDER — POTASSIUM CHLORIDE IN NACL 40-0.9 MEQ/L-% IV SOLN
INTRAVENOUS | Status: DC
  Administered 2023-06-04: 50 mL/h via INTRAVENOUS
  Filled 2023-06-01 (×7): qty 1000

## 2023-06-01 MED ORDER — DIVALPROEX SODIUM 250 MG PO DR TAB
500.0000 mg | DELAYED_RELEASE_TABLET | Freq: Two times a day (BID) | ORAL | Status: DC
  Administered 2023-06-01 – 2023-06-08 (×14): 500 mg via ORAL
  Filled 2023-06-01 (×14): qty 2

## 2023-06-01 NOTE — Progress Notes (Signed)
PROGRESS NOTE    Andrea [name]  ZOX:[id number] DOB: August 25, 1935 DOA: 05/30/2023 PCP: Caesar Bookman, NP   Brief Narrative: 88 y.o. female with medical history significant of atrial fibrillation, anemia, anxiety, depression, Alzheimer's dementia, type 2 diabetes, incontinence, constipation, cervicalgia, headaches, hypothyroidism, hypertension, lumbar radiculopathy, history of pneumonia, prediabetes, sleep apnea not on CPAP, cervical cancer, history of left mastectomy who was brought to the emergency department due to worsening dementia and seizures.  Her daughter stated that she was awake around 0300 when she heard her mother made a noise.  She went to her room, found her seizing, rolling her eyes for about a minute and then became postictal.  She stated that for the last few months her dementia has been getting worse.  She used to have a nap in the morning and then nap in the afternoon, but lately she has not been taking her afternoon nap and instead has been showing sundowning symptoms.  She was seen recently by neurology who started her on memantine, but the patient did not tolerated well as she was very somnolent next day after starting with the night dose, so in that month he has not been continued by her family.  She has had good appetite.  No fever, URI symptoms, cough, emesis or diarrhea.  However, the patient is not able to fully express herself when it comes to her symptoms, given her advanced dementia.   Lab work: Her urine analysis was cloudy with ketones of 5 and protein of 100 mg/dL.[name]  There was large leukocyte esterase, negative nitrite, RBC 11-20, WBC more than 50, few bacteria, positive WBC clumps, mucus, budding yeast and hyaline casts.  CBCs are white count 12.8 with 64% neutrophils, 26% lymphocytes and 7% monocytes.  Hemoglobin 11.0 g/dL and platelets 811.  CMP showed a potassium of 3.1 and CO2 of 17 mmol/L with an anion gap of 18.  The rest of the electrolytes were normal.  Glucose  161, BUN 20 and creatinine 1.11 mg/dL.  LFTs were normal, except for an albumin level 3.4 g/dL.   Imaging: CT head without contrast showing no acute intracranial normalities.  There is moderate cerebral and mild cerebellar atrophy with extensive chronic microvascular ischemic changes in the cerebral white matter.   ED course: Initial vital signs were temperature 97.8 F, pulse 88, respiration 20, BP 130/81 mmHg O2 sat 97% on room air.  The patient received ceftriaxone 1 g IVPB, Keppra 1500 mg IVPB and lorazepam 1 mg IVP.  The case was discussed with neurology who will see the patient Wonda Olds, recommended EEG and MRI of brain.    Assessment & Plan:   Principal Problem:   Seizures (HCC) Active Problems:   DEPRESSION/ANXIETY   Lower urinary tract infectious disease   Chronic pain associated with significant psychosocial dysfunction   Essential (primary) hypertension   Acquired hypothyroidism   OSA on CPAP   Paroxysmal A-fib (HCC)   Hyperlipidemia   Chronic deep vein thrombosis (DVT) of proximal vein of both lower extremities (HCC)   Normocytic anemia   Type 2 diabetes mellitus with hyperglycemia (HCC)   Metabolic acidosis  #1 new onset seizures grand mal in the setting of dementia, prior history of encephalitis, breast cancer ?  UTI MRI brain no acute intracranial abnormality or mass.  Bilateral hippocampal atrophy.  Severe chronic small vessel disease. EEG moderate to severe diffuse encephalopathy. Keppra changed to Depakote by neurology As needed Ativan PT evaluation Pending Will stop tramadol in view of new  onset seizures as it decreases the threshold   #2 UTI UA with ketones and large amount of leukocyte I did not see a urine culture that is pending. Will order urine culture by itself. She takes trimethoprim prophylactically prior to admission will restart on discharge. Leukocytosis resolved  #3 depression and anxiety continue home meds trazodone duloxetine and  mirtazapine  #4 essential hypertension on Lasix I will hold Lasix since she appears dry with urine ketones. Will give 1 bag of normal saline.  #5 hypothyroidism on Synthroid last TSH done in April 2024 stable  #6 obstructive sleep apnea patient has not using CPAP  #7 paroxysmal atrial fibrillation on Eliquis  #8 DVT on Eliquis  #9 type 2 diabetes on ssi A1c 5.2  #10 hypokalemia replete   #11 anemia of chronic disease hemoglobin 8.4 from 11 on admission in 24 hours.  No obvious signs of bleeding noted.  Will check FOBT and iron panel indicates iron def anemia  #12 goals of care-patient is DNR discussed with patient's daughter who is her healthcare power of attorney.  Will consult palliative care as patient has been declining in the last few weeks to months.   Estimated body mass index is 35.32 kg/m as calculated from the following:   Height as of this encounter: 5\' 2"  (1.575 m).   Weight as of this encounter: 87.6 kg.  DVT prophylaxis: Eliquis  code Status: DNR Family Communication: None at bedside Disposition Plan:  Status is: Inpatient Remains inpatient appropriate because: New onset seizures   Consultants:  Neurology  Procedures: None Antimicrobials: Rocephin  Subjective:  Resting in bed  Thinks she is at home On depakote   Objective: Vitals:   05/31/23 1220 05/31/23 2112 06/01/23 0619 06/01/23 1151  BP: (!) 134/45 (!) 150/59 102/74 (!) 112/50  Pulse: 68 70 63 62  Resp:  18 15   Temp: 97.9 F (36.6 C) 98.2 F (36.8 C) 98.6 F (37 C) 98.5 F (36.9 C)  TempSrc: Oral Oral Oral Oral  SpO2: 99% 96% 96% 97%  Weight:      Height:        Intake/Output Summary (Last 24 hours) at 06/01/2023 1244 Last data filed at 06/01/2023 1124 Gross per 24 hour  Intake 1538.43 ml  Output 1400 ml  Net 138.43 ml   Filed Weights   05/30/23 1105 05/30/23 1140 05/30/23 1700  Weight: 93 kg 93 kg 87.6 kg    Examination:  General exam: Appears chronic  ill-appearing and  frail Respiratory system: Clear to auscultation. Respiratory effort normal. Cardiovascular system: S1 & S2 heard, RRR. No JVD, murmurs, rubs, gallops or clicks. No pedal edema. Gastrointestinal system: Abdomen is nondistended, soft and nontender. No organomegaly or masses felt. Normal bowel sounds heard. Central nervous system: Awake does not follow any commands extremities: Trace edema   Data Reviewed: I have personally reviewed following labs and imaging studies  CBC: Recent Labs  Lab 05/30/23 0438 05/31/23 0345 06/01/23 0404  WBC 12.8* 5.8 5.7  NEUTROABS 8.4*  --   --   HGB 11.0* 8.4* 8.8*  HCT 37.2 26.9* 28.4*  MCV 86.7 83.3 82.8  PLT 348 223 246   Basic Metabolic Panel: Recent Labs  Lab 05/30/23 0438 05/31/23 0345 06/01/23 0404  NA 139 138 139  K 3.1* 3.7 3.3*  CL 104 107 106  CO2 17* 25 26  GLUCOSE 161* 96 93  BUN 20 14 13   CREATININE 1.11* 0.81 0.67  CALCIUM 9.1 7.8* 8.2*  MG 2.0  --   --  PHOS 3.0  --   --    GFR: Estimated Creatinine Clearance: 52.8 mL/min (by C-G formula based on SCr of 0.67 mg/dL). Liver Function Tests: Recent Labs  Lab 06/07/2023 0438 05/31/23 0345 06/01/23 0404  AST 22 15 14*  ALT 11 10 9   ALKPHOS 61 45 45  BILITOT 0.3 0.4 0.4  PROT 7.0 5.3* 5.6*  ALBUMIN 3.4* 2.6* 2.6*   No results for input(s): "LIPASE", "AMYLASE" in the last 168 hours. No results for input(s): "AMMONIA" in the last 168 hours. Coagulation Profile: No results for input(s): "INR", "PROTIME" in the last 168 hours. Cardiac Enzymes: No results for input(s): "CKTOTAL", "CKMB", "CKMBINDEX", "TROPONINI" in the last 168 hours. BNP (last 3 results) No results for input(s): "PROBNP" in the last 8760 hours. HbA1C: Recent Labs    05/31/23 0345  HGBA1C 5.4   CBG: Recent Labs  Lab 05/31/23 1224 05/31/23 1713 05/31/23 2115 06/01/23 0742 06/01/23 1148  GLUCAP 79 90 90 82 93   Lipid Profile: No results for input(s): "CHOL", "HDL", "LDLCALC", "TRIG",  "CHOLHDL", "LDLDIRECT" in the last 72 hours. Thyroid Function Tests: No results for input(s): "TSH", "T4TOTAL", "FREET4", "T3FREE", "THYROIDAB" in the last 72 hours. Anemia Panel: Recent Labs    05/31/23 1212 06/01/23 0404  VITAMINB12 1,811*  --   FOLATE  --  14.9  FERRITIN  --  16  TIBC  --  283  IRON  --  18*  RETICCTPCT 1.7  --    Sepsis Labs: No results for input(s): "PROCALCITON", "LATICACIDVEN" in the last 168 hours.  Recent Results (from the past 240 hour(s))  Urine Culture (for pregnant, neutropenic or urologic patients or patients with an indwelling urinary catheter)     Status: None   Collection Time: 05/31/23 11:07 AM   Specimen: Urine, Clean Catch  Result Value Ref Range Status   Specimen Description   Final    URINE, CLEAN CATCH Performed at [facility], [address], [place], [place] [address]    Special Requests   Final    NONE Performed at [facility], [address], [place], [place] [address]    Culture   Final    NO GROWTH Performed at [facility], [address], [place], [place] [address]    Report Status 06/01/2023 FINAL  Final         Radiology Studies: EEG adult  Result Date: 2023-06-07 Charlsie Quest, MD     June 07, 2023  9:42 PM Patient Name: Linelle Haynie MRN: [id number] Epilepsy Attending: Charlsie Quest Referring Physician/Provider: Caryl Pina, MD Date: June 07, 2023 Duration: 24.36 mins Patient history: 88 yo F presents after having a seizure at about 3:00 in the morning. EEG to evaluate for seizure. Level of alertness:  lethargic AEDs during EEG study: VPA Technical aspects: This EEG study was done with scalp electrodes positioned according to the 10-20 International system of electrode placement. Electrical activity was reviewed with band pass filter of 1-70Hz , sensitivity of 7 uV/mm, display speed of 72mm/sec with a 60Hz  notched filter applied as appropriate. EEG data were recorded  continuously and digitally stored.  Video monitoring was available and reviewed as appropriate. Description: EEG showed continuous generalized 3 to 6 Hz theta-delta slowing. Generalized periodic discharges with triphasic morphology at 0.5-1 Hz, more prominent when awake/stimulated were also noted. Hyperventilation and photic stimulation were not performed.   ABNORMALITY - Periodic discharges with triphasic morphology, generalized ( GPDs) - Continuous slow, generalized IMPRESSION: This study is  suggestive of moderate to severe diffuse encephalopathy, nonspecific etiology but likely related to toxic-metabolic causes. No seizures or definite epileptiform discharges were seen throughout the recording. Priyanka Annabelle Harman        Scheduled Meds:  apixaban  5 mg Oral BID   brexpiprazole  2 mg Oral QHS   busPIRone  10 mg Oral Daily   And   busPIRone  5 mg Oral QHS   Chlorhexidine Gluconate Cloth  6 each Topical Daily   cyanocobalamin  500 mcg Oral Daily   divalproex  500 mg Oral BID   donepezil  10 mg Oral Daily   DULoxetine  90 mg Oral Daily   levothyroxine  50 mcg Oral QAC breakfast   mirtazapine  7.5 mg Oral QHS   omega-3 acid ethyl esters  1 g Oral QPM   potassium chloride  40 mEq Oral Once   sodium chloride flush  10-40 mL Intracatheter Q12H   traZODone  50 mg Oral QHS   Continuous Infusions:  sodium chloride 75 mL/hr at 06/01/23 0333   cefTRIAXone (ROCEPHIN)  IV 1 g (06/01/23 1051)     LOS: 2 days   Time spent: 38 min  Alwyn Ren, MD 06/01/2023, 12:44 PM

## 2023-06-01 NOTE — Progress Notes (Signed)
PT Cancellation Note  Patient Details Name: Andrea [name] MRN: [id number] DOB: [birth year]   Cancelled Treatment:    Reason Eval/Treat Not Completed: Other (comment) RN in room and reports she just attempted to assist pt with sitting EOB to get to [facility] however pt had a staring episode again so returned to supine.  Will check back as schedule permits.   Kati L Payson 06/01/2023, 11:11 AM Paulino Door, DPT Physical Therapist Acute Rehabilitation Services Office: [phone]

## 2023-06-01 NOTE — Plan of Care (Signed)
  Problem: Fluid Volume: Goal: Ability to maintain a balanced intake and output will improve Outcome: Progressing   Problem: Elimination: Goal: Will not experience complications related to urinary retention Outcome: Progressing   Problem: Safety: Goal: Ability to remain free from injury will improve Outcome: Progressing

## 2023-06-02 DIAGNOSIS — R569 Unspecified convulsions: Secondary | ICD-10-CM | POA: Diagnosis not present

## 2023-06-02 LAB — GLUCOSE, CAPILLARY
Glucose-Capillary: 72 mg/dL (ref 70–99)
Glucose-Capillary: 103 mg/dL — ABNORMAL HIGH (ref 70–99)
Glucose-Capillary: 89 mg/dL (ref 70–99)
Glucose-Capillary: 101 mg/dL — ABNORMAL HIGH (ref 70–99)

## 2023-06-02 NOTE — Plan of Care (Signed)
  Problem: Education: Goal: Ability to describe self-care measures that may prevent or decrease complications (Diabetes Survival Skills Education) will improve Outcome: Progressing   Problem: Coping: Goal: Ability to adjust to condition or change in health will improve Outcome: Progressing   Problem: Skin Integrity: Goal: Risk for impaired skin integrity will decrease Outcome: Progressing   Problem: Clinical Measurements: Goal: Ability to maintain clinical measurements within normal limits will improve Outcome: Progressing Goal: Respiratory complications will improve Outcome: Progressing   Problem: Nutrition: Goal: Adequate nutrition will be maintained Outcome: Progressing   Problem: Safety: Goal: Ability to remain free from injury will improve Outcome: Progressing   Problem: Skin Integrity: Goal: Risk for impaired skin integrity will decrease Outcome: Progressing

## 2023-06-02 NOTE — Progress Notes (Signed)
PROGRESS NOTE    Andrea [name]  ION:[id number] DOB: [birth year] DOA: 05/30/2023 PCP: Caesar Bookman, NP   Brief Narrative: 88 y.o. female with medical history significant of atrial fibrillation, anemia, anxiety, depression, Alzheimer's dementia, type 2 diabetes, incontinence, constipation, cervicalgia, headaches, hypothyroidism, hypertension, lumbar radiculopathy, history of pneumonia, prediabetes, sleep apnea not on CPAP, cervical cancer, history of left mastectomy who was brought to the emergency department due to worsening dementia and seizures.  Her daughter stated that she was awake around 0300 when she heard her mother made a noise.  She went to her room, found her seizing, rolling her eyes for about a minute and then became postictal.  She stated that for the last few months her dementia has been getting worse.  She used to have a nap in the morning and then nap in the afternoon, but lately she has not been taking her afternoon nap and instead has been showing sundowning symptoms.  She was seen recently by neurology who started her on memantine, but the patient did not tolerated well as she was very somnolent next day after starting with the night dose, so in that month he has not been continued by her family.  She has had good appetite.  No fever, URI symptoms, cough, emesis or diarrhea.  However, the patient is not able to fully express herself when it comes to her symptoms, given her advanced dementia.   Lab work: Her urine analysis was cloudy with ketones of 5 and protein of 100 mg/dL.[name]  There was large leukocyte esterase, negative nitrite, RBC 11-20, WBC more than 50, few bacteria, positive WBC clumps, mucus, budding yeast and hyaline casts.  CBCs are white count 12.8 with 64% neutrophils, 26% lymphocytes and 7% monocytes.  Hemoglobin 11.0 g/dL and platelets 244.  CMP showed a potassium of 3.1 and CO2 of 17 mmol/L with an anion gap of 18.  The rest of the electrolytes were normal.  Glucose  161, BUN 20 and creatinine 1.11 mg/dL.  LFTs were normal, except for an albumin level 3.4 g/dL.   Imaging: CT head without contrast showing no acute intracranial normalities.  There is moderate cerebral and mild cerebellar atrophy with extensive chronic microvascular ischemic changes in the cerebral white matter.   ED course: Initial vital signs were temperature 97.8 F, pulse 88, respiration 20, BP 130/81 mmHg O2 sat 97% on room air.  The patient received ceftriaxone 1 g IVPB, Keppra 1500 mg IVPB and lorazepam 1 mg IVP.  The case was discussed with neurology who will see the patient Wonda Olds, recommended EEG and MRI of brain.    Assessment & Plan:   Principal Problem:   Seizures (HCC) Active Problems:   DEPRESSION/ANXIETY   Lower urinary tract infectious disease   Chronic pain associated with significant psychosocial dysfunction   Essential (primary) hypertension   Acquired hypothyroidism   OSA on CPAP   Paroxysmal A-fib (HCC)   Hyperlipidemia   Chronic deep vein thrombosis (DVT) of proximal vein of both lower extremities (HCC)   Normocytic anemia   Type 2 diabetes mellitus with hyperglycemia (HCC)   Metabolic acidosis  #1 new onset seizures grand mal in the setting of dementia, prior history of encephalitis, breast cancer ?  UTI MRI brain no acute intracranial abnormality or mass.  Bilateral hippocampal atrophy.  Severe chronic small vessel disease. EEG moderate to severe diffuse encephalopathy. Keppra changed to Depakote by neurology As needed Ativan PT evaluation rec snf Will stop tramadol in view of  new onset seizures as it decreases the threshold   #2 UTI UA with ketones and large amount of leukocyte  Finish today dose then dc rocephin  #3 depression and anxiety continue home meds trazodone duloxetine and mirtazapine  #4 essential hypertension on Lasix I will hold Lasix since she appears dry with urine ketones.  #5 hypothyroidism on Synthroid last TSH done in April  2024 stable  #6 obstructive sleep apnea patient has not using CPAP  #7 paroxysmal atrial fibrillation on Eliquis  #8 DVT on Eliquis  #9 type 2 diabetes on ssi A1c 5.2  #10 hypokalemia replete   #11 anemia of chronic disease hemoglobin 8.4 from 11 on admission in 24 hours.  No obvious signs of bleeding noted.  Will check FOBT and iron panel indicates iron def anemia  #12 goals of care-patient is DNR discussed with patient's daughter who is her healthcare power of attorney.  Will consult palliative care as patient has been declining in the last few weeks to months.   Estimated body mass index is 35.32 kg/m as calculated from the following:   Height as of this encounter: 5\' 2"  (1.575 m).   Weight as of this encounter: 87.6 kg.  DVT prophylaxis: Eliquis  code Status: DNR Family Communication: None at bedside Disposition Plan:  Status is: Inpatient Remains inpatient appropriate because: New onset seizures   Consultants:  Neurology  Procedures: None Antimicrobials: Rocephin  Subjective: More awake Still confused Dont know where she is Son at bed side Objective: Vitals:   06/01/23 0619 06/01/23 1151 06/01/23 2039 06/02/23 0518  BP: 102/74 (!) 112/50 (!) 138/49 (!) 152/62  Pulse: 63 62 61 63  Resp: 15  16 16   Temp: 98.6 F (37 C) 98.5 F (36.9 C) 98.1 F (36.7 C) 97.8 F (36.6 C)  TempSrc: Oral Oral Oral Oral  SpO2: 96% 97% 99% 99%  Weight:      Height:        Intake/Output Summary (Last 24 hours) at 06/02/2023 1429 Last data filed at 06/02/2023 1359 Gross per 24 hour  Intake 2677.5 ml  Output 1851 ml  Net 826.5 ml   Filed Weights   05/30/23 1105 05/30/23 1140 05/30/23 1700  Weight: 93 kg 93 kg 87.6 kg    Examination:  General exam: Appears chronic  ill-appearing and frail Respiratory system: Clear to auscultation. Respiratory effort normal. Cardiovascular system: S1 & S2 heard, RRR. No JVD, murmurs, rubs, gallops or clicks. No pedal  edema. Gastrointestinal system: Abdomen is nondistended, soft and nontender. No organomegaly or masses felt. Normal bowel sounds heard. Central nervous system: Awake does not follow any commands extremities: Trace edema   Data Reviewed: I have personally reviewed following labs and imaging studies  CBC: Recent Labs  Lab 05/30/23 0438 05/31/23 0345 06/01/23 0404  WBC 12.8* 5.8 5.7  NEUTROABS 8.4*  --   --   HGB 11.0* 8.4* 8.8*  HCT 37.2 26.9* 28.4*  MCV 86.7 83.3 82.8  PLT 348 223 246   Basic Metabolic Panel: Recent Labs  Lab 05/30/23 0438 05/31/23 0345 06/01/23 0404  NA 139 138 139  K 3.1* 3.7 3.3*  CL 104 107 106  CO2 17* 25 26  GLUCOSE 161* 96 93  BUN 20 14 13   CREATININE 1.11* 0.81 0.67  CALCIUM 9.1 7.8* 8.2*  MG 2.0  --   --   PHOS 3.0  --   --    GFR: Estimated Creatinine Clearance: 52.8 mL/min (by C-G formula based on SCr  of 0.67 mg/dL). Liver Function Tests: Recent Labs  Lab 05/30/23 0438 05/31/23 0345 06/01/23 0404  AST 22 15 14*  ALT 11 10 9   ALKPHOS 61 45 45  BILITOT 0.3 0.4 0.4  PROT 7.0 5.3* 5.6*  ALBUMIN 3.4* 2.6* 2.6*   No results for input(s): "LIPASE", "AMYLASE" in the last 168 hours. No results for input(s): "AMMONIA" in the last 168 hours. Coagulation Profile: No results for input(s): "INR", "PROTIME" in the last 168 hours. Cardiac Enzymes: No results for input(s): "CKTOTAL", "CKMB", "CKMBINDEX", "TROPONINI" in the last 168 hours. BNP (last 3 results) No results for input(s): "PROBNP" in the last 8760 hours. HbA1C: Recent Labs    05/31/23 0345  HGBA1C 5.4   CBG: Recent Labs  Lab 06/01/23 1148 06/01/23 1644 06/01/23 2036 06/02/23 0721 06/02/23 1147  GLUCAP 93 88 101* 72 103*   Lipid Profile: No results for input(s): "CHOL", "HDL", "LDLCALC", "TRIG", "CHOLHDL", "LDLDIRECT" in the last 72 hours. Thyroid Function Tests: No results for input(s): "TSH", "T4TOTAL", "FREET4", "T3FREE", "THYROIDAB" in the last 72 hours. Anemia  Panel: Recent Labs    05/31/23 1212 06/01/23 0404  VITAMINB12 1,811*  --   FOLATE  --  14.9  FERRITIN  --  16  TIBC  --  283  IRON  --  18*  RETICCTPCT 1.7  --    Sepsis Labs: No results for input(s): "PROCALCITON", "LATICACIDVEN" in the last 168 hours.  Recent Results (from the past 240 hour(s))  Urine Culture (for pregnant, neutropenic or urologic patients or patients with an indwelling urinary catheter)     Status: None   Collection Time: 05/31/23 11:07 AM   Specimen: Urine, Clean Catch  Result Value Ref Range Status   Specimen Description   Final    URINE, CLEAN CATCH Performed at [facility], [address], [place], [place] [address]    Special Requests   Final    NONE Performed at [facility], [address], [place], [place] [address]    Culture   Final    NO GROWTH Performed at [facility], [address] [place], [place] [address]    Report Status 06/01/2023 FINAL  Final         Radiology Studies: No results found.      Scheduled Meds:  apixaban  5 mg Oral BID   brexpiprazole  2 mg Oral QHS   busPIRone  10 mg Oral Daily   And   busPIRone  5 mg Oral QHS   Chlorhexidine Gluconate Cloth  6 each Topical Daily   cyanocobalamin  500 mcg Oral Daily   divalproex  500 mg Oral BID   donepezil  10 mg Oral Daily   DULoxetine  90 mg Oral Daily   levothyroxine  50 mcg Oral QAC breakfast   mirtazapine  7.5 mg Oral QHS   omega-3 acid ethyl esters  1 g Oral QPM   potassium chloride  40 mEq Oral Once   sodium chloride flush  10-40 mL Intracatheter Q12H   traZODone  50 mg Oral QHS   Continuous Infusions:  0.9 % NaCl with KCl 40 mEq / L 75 mL/hr at 06/02/23 0519   cefTRIAXone (ROCEPHIN)  IV 1 g (06/02/23 0813)     LOS: 3 days   Time spent: 38 min  Alwyn Ren, MD 06/02/2023, 2:29 PM

## 2023-06-02 NOTE — Consult Note (Signed)
Consultation Note Date: 06/02/2023   Patient Name: Andrea [name]  DOB: May 07, 1935  MRN: [id number]  Age / Sex: 88 y.o., female  PCP: Ngetich, Donalee Citrin, NP Referring Physician: Alwyn Ren, MD  Reason for Consultation: Establishing goals of care  HPI/Patient Profile: 88 y.o. female    admitted on 05/30/2023    Clinical Assessment and Goals of Care: 88 year old lady who lives at home with her daughter, history of atrial fibrillation anemia anxiety depression Alzheimer's dementia diabetes.  Chart reviewed, patient noted to have history of left-sided breast cancer and was under the care of Dr. Pamelia Hoit.   Patient admitted to the hospital medicine service because of worsening dementia and seizures.  Patient noted to have seizure type activity at home and then was in a postictal state, very somnolent. Patient admitted to the hospital for new onset seizures grand mal in the setting of dementia, prior history of encephalitis, prior history of breast cancer. MRI of the brain with no intracranial abnormality or mass, bilateral hippocampal atrophy and severe chronic small vessel disease. EEG with moderate to severe diffuse encephalopathy. Patient placed on antiepileptics. Physical therapy evaluation was done. Palliative consult for ongoing goals of care discussions has been requested. Patient seen and examined, discussed with son Onalee Hua present at bedside.  Call placed and also discussed with the daughter with whom the patient lives.  Goals of care discussions undertaken.  Goals wishes and values attempted to be explored. Palliative medicine is specialized medical care for people living with serious illness. It focuses on providing relief from the symptoms and stress of a serious illness. The goal is to improve quality of life for both the patient and the family. Goals of care: Broad aims of medical therapy in  relation to the patient's values and preferences. Our aim is to provide medical care aimed at enabling patients to achieve the goals that matter most to them, given the circumstances of their particular medical situation and their constraints.     HCPOA  Daughter Teanna Carli [phone] Son Onalee Hua  SUMMARY OF RECOMMENDATIONS   Agree with DNR Recommend home with hospice-discussed with son Onalee Hua present at bedside, call placed and also discussed with daughter Nokomis Kazmer on the phone.  TOC consult placed.  Continue current mode of care Thank you for the consult  Code Status/Advance Care Planning: DNR   Symptom Management:     Palliative Prophylaxis:  Delirium Protocol   Psycho-social/Spiritual:  Desire for further Chaplaincy support:yes Additional Recommendations: Education on Hospice  Prognosis:  < 6 months  Discharge Planning: Home with Hospice      Primary Diagnoses: Present on Admission:  Acquired hypothyroidism  Chronic pain associated with significant psychosocial dysfunction  DEPRESSION/ANXIETY  Essential (primary) hypertension  Hyperlipidemia  Paroxysmal A-fib (HCC)  Lower urinary tract infectious disease  Normocytic anemia  Chronic deep vein thrombosis (DVT) of proximal vein of both lower extremities (HCC)  Type 2 diabetes mellitus with hyperglycemia (HCC)  Metabolic acidosis   I have reviewed the medical record, interviewed  the patient and family, and examined the patient. The following aspects are pertinent.  Past Medical History:  Diagnosis Date   A-fib (HCC)    Anemia    Anxiety    Arthritis    hands, spine    Bladder incontinence    Blood transfusion    Cancer (HCC) 02/2019   left breast cancer   Cervicalgia    Constipation    Cough    Wert-onset 08/2009, as of 2014- resolved    Deep venous thrombosis (HCC)    post op, rec'd/needed  blood thinner    Dementia (HCC)    Depression    Depression with anxiety    Diabetes (HCC)     Dysrhythmia    a-fib   Falls frequently    pt. reports that she was falling 3 times a day, has had PT at a facility for a while & now is getting home PT 2-3 times/ week    Headache(784.0) 09/27/2011   History of stress test    15-20 yrs.ago    Hypotension    Hypothyroidism    Lumbar radiculopathy 09/26/2011   Pneumonia    never been in hosp. for pneumonia    Prediabetes    Right hand fracture    Monday   Shortness of breath    Sleep apnea    does not use consistently   Social History   Socioeconomic History   Marital status: Single    Spouse name: Not on file   Number of children: 3   Years of education: College   Highest education level: Not on file  Occupational History   Occupation: Retired Charity fundraiser  Tobacco Use   Smoking status: Former    Current packs/day: 0.00    Average packs/day: 0.3 packs/day for 20.0 years (5.0 ttl pk-yrs)    Types: Cigarettes    Start date: 01/18/2001    Quit date: 01/18/2021    Years since quitting: 2.3   Smokeless tobacco: Never   Tobacco comments:    1-2 cigarettes a day  Vaping Use   Vaping status: Never Used  Substance and Sexual Activity   Alcohol use: Not Currently    Comment: Glass of wine 1-2 times a year at Christmas   Drug use: No   Sexual activity: Never  Other Topics Concern   Not on file  Social History Narrative   Patient is single and lives alone.   Patient is retired.   Patient has a college education.   Patient has three adult children   Patient is right-handed.   Patient drinks 3-4 cups of caffeine (soda and coffee) daily.   Walks with walker   Social Determinants of Health   Financial Resource Strain: Low Risk  (12/03/2017)   Overall Financial Resource Strain (CARDIA)    Difficulty of Paying Living Expenses: Not very hard  Food Insecurity: No Food Insecurity (05/30/2023)   Hunger Vital Sign    Worried About Running Out of Food in the Last Year: Never true    Ran Out of Food in the Last Year: Never true   Transportation Needs: No Transportation Needs (05/30/2023)   PRAPARE - Administrator, Civil Service (Medical): No    Lack of Transportation (Non-Medical): No  Physical Activity: Inactive (12/03/2017)   Exercise Vital Sign    Days of Exercise per Week: 0 days    Minutes of Exercise per Session: 0 min  Stress: No Stress Concern Present (12/03/2017)   [employer] of  Occupational Health - Occupational Stress Questionnaire    Feeling of Stress : Only a little  Social Connections: Unknown (07/26/2022)   Received from [facility]   Social Network    Social Network: Not on file   Family History  Problem Relation Age of Onset   Heart disease Father    Malignant hyperthermia Father    Arthritis Father    Deep vein thrombosis Son    Diabetes Son    Obesity Son    Depression Son    Hypertension Son    Parkinson's disease Mother    Diabetes Daughter    Hypertension Daughter    Anxiety disorder Daughter    Hyperlipidemia Son    Hypertension Son    Cancer Maternal Aunt    Scheduled Meds:  apixaban  5 mg Oral BID   brexpiprazole  2 mg Oral QHS   busPIRone  10 mg Oral Daily   And   busPIRone  5 mg Oral QHS   Chlorhexidine Gluconate Cloth  6 each Topical Daily   cyanocobalamin  500 mcg Oral Daily   divalproex  500 mg Oral BID   donepezil  10 mg Oral Daily   DULoxetine  90 mg Oral Daily   levothyroxine  50 mcg Oral QAC breakfast   mirtazapine  7.5 mg Oral QHS   omega-3 acid ethyl esters  1 g Oral QPM   potassium chloride  40 mEq Oral Once   sodium chloride flush  10-40 mL Intracatheter Q12H   traZODone  50 mg Oral QHS   Continuous Infusions:  0.9 % NaCl with KCl 40 mEq / L 75 mL/hr at 06/02/23 0519   cefTRIAXone (ROCEPHIN)  IV 1 g (06/02/23 0813)   PRN Meds:.acetaminophen **OR** acetaminophen, diazepam, diazepam, ondansetron **OR** ondansetron (ZOFRAN) IV, sodium chloride flush Medications Prior to Admission:  Prior to Admission medications   Medication Sig  Start Date End Date Taking? Authorizing Provider  brexpiprazole (REXULTI) 2 MG TABS tablet Take 2 mg by mouth at bedtime.   Yes [provider]  busPIRone (BUSPAR) 5 MG tablet Take 5-10 mg by mouth 2 (two) times daily. Take 10 mg by mouth every morning and 5 mg every evening.   Yes [provider]  CALCIUM CITRATE PO Take 600 mg by mouth daily.    Yes [provider]  clonazePAM (KLONOPIN) 0.5 MG tablet Take 0.25 mg by mouth as needed for anxiety.   Yes [provider]  D-Mannose 500 MG CAPS Take by mouth daily.   Yes [provider]  donepezil (ARICEPT) 10 MG tablet Take 1 tablet (10 mg total) by mouth daily. Take 1  tablet at 10 mg daily 04/26/23  Yes Gwynneth Munson, Sung Amabile, PA-C  DULoxetine (CYMBALTA) 30 MG capsule Take 90 mg by mouth daily.   Yes [provider]  ELIQUIS 5 MG TABS tablet TAKE 1 TABLET BY MOUTH TWICE A DAY 04/02/23  Yes Ngetich, Dinah C, NP  FIBER PO Take 1 tablet by mouth every evening.   Yes [provider]  furosemide (LASIX) 20 MG tablet TAKE 1 TABLET BY MOUTH EVERY DAY 01/11/23  Yes Ngetich, Dinah C, NP  levothyroxine (SYNTHROID) 50 MCG tablet TAKE 1 TABLET BY MOUTH DAILY BEFORE BREAKFAST. 03/27/23  Yes Ngetich, Dinah C, NP  Menaquinone-7 (VITAMIN K2 PO) Take 1 tablet by mouth daily.   Yes [provider]  mirtazapine (REMERON) 7.5 MG tablet Take 7.5 mg by mouth at bedtime. 04/05/23  Yes [provider]  Multiple Vitamins-Minerals (MULTIVITAMIN PO) Take 1 tablet by mouth daily.    Yes [provider]  omega-3 acid ethyl esters (LOVAZA) 1 g capsule Take 1 g by mouth every evening.   Yes [provider]  potassium chloride SA (KLOR-CON M20) 20 MEQ tablet TAKE 1 TABLET BY MOUTH EVERY DAY 05/21/23  Yes Ngetich, Dinah C, NP  Probiotic Product (PROBIOTIC DAILY PO) Take 1 tablet by mouth daily. OTC   Yes [provider]  traMADol (ULTRAM) 50 MG tablet TAKE 1 TABLET (50 MG TOTAL) BY MOUTH  EVERY 6 (SIX) HOURS AS NEEDED FOR MODERATE PAIN 05/14/23  Yes Sharon Seller, NP  traZODone (DESYREL) 100 MG tablet Take 100 mg by mouth at bedtime.   Yes [provider]  trimethoprim (TRIMPEX) 100 MG tablet Take 100 mg by mouth at bedtime. 09/11/22  Yes [provider]  vitamin B-12 (CYANOCOBALAMIN) 500 MCG tablet Take 500 mcg by mouth daily.   Yes [provider]  Vitamin D, Ergocalciferol, (DRISDOL) 1.25 MG (50000 UNIT) CAPS capsule Take 50,000 Units by mouth once a week. Monday's 09/03/22  Yes [provider]  memantine (NAMENDA) 5 MG tablet TAKE 1 TABLET (5 MG AT NIGHT) FOR 2 WEEKS, THEN INCREASE TO 1 TABLET (5 MG) TWICE A DAY Patient not taking: Reported on 05/30/2023 05/18/23   Marcos Eke, PA-C  oxybutynin (DITROPAN-XL) 10 MG 24 hr tablet Take 10 mg by mouth at bedtime. Patient not taking: Reported on 05/30/2023    [provider]   Allergies  Allergen Reactions   Clindamycin Hcl Other (See Comments)    REACTION: swelling, pt. Reports that she had a 16 lb. weightgain in one day   Review of Systems Denies pain  Physical Exam Elderly lady resting in bed in Chronically ill-appearing Frail-appearing Regular work of breathing Abdomen not distended Awake, answers some questions appropriately    Vital Signs: BP (!) 152/62 (BP Location: Right Arm)   Pulse 63   Temp 97.8 F (36.6 C) (Oral)   Resp 16   Ht 5\' 2"  (1.575 m)   Wt 87.6 kg   SpO2 99%   BMI 35.32 kg/m  Pain Scale: 0-10   Pain Score: 0-No pain   SpO2: SpO2: 99 % O2 Device:SpO2: 99 % O2 Flow Rate: .O2 Flow Rate (L/min): 22 L/min  IO: Intake/output summary:  Intake/Output Summary (Last 24 hours) at 06/02/2023 1036 Last data filed at 06/02/2023 1032 Gross per 24 hour  Intake 2527.5 ml  Output 1250 ml  Net 1277.5 ml    LBM: Last BM Date :  (PTA) Baseline Weight: Weight: 93 kg Most recent weight: Weight: 87.6 kg     Palliative Assessment/Data:   PPS  40%  Time In:  1345 Time Out:  1440 Time Total:  60  Greater than 50%  of this time was spent counseling and coordinating care related to the above assessment and plan.  Signed by: Rosalin Hawking, MD   Please contact Palliative Medicine Team phone at [phone] for questions and concerns.  For individual provider: See Loretha Stapler

## 2023-06-02 NOTE — Evaluation (Signed)
Physical Therapy Evaluation Patient Details Name: Andrea [name] MRN: [id number] DOB: [birth year] Today's Date: 06/02/2023  History of Present Illness  88 y.o. female with medical history significant of atrial fibrillation, anemia, anxiety, depression, Alzheimer's dementia, type 2 diabetes, incontinence, constipation, cervicalgia, headaches, hypothyroidism, hypertension, back surgery,  sleep apnea not on CPAP, cervical cancer, breast cancer s/p left mastectomy who was brought to the emergency department due to worsening dementia and seizures.  Pt admitted 05/30/23 new onset seizures grand mal in the setting of dementia, prior history of encephalitis.  MRI brain no acute intracranial abnormality or mass.  Clinical Impression  Pt admitted with above diagnosis.  Pt currently with functional limitations due to the deficits listed below (see PT Problem List). Pt will benefit from acute skilled PT to increase their independence and safety with mobility to allow discharge.  Pt assisted with transfer OOB to recliner.  Pt's daughter, [name], present and assisted with encouraging pt and providing verbal cues.  Pt does require a little assist at baseline for transfers, so pt could possibly benefit from HHPT if pt/family agreeable.  Will continue to assist with safely mobilizing pt in acute setting in preparation for d/c home.         Assistance Recommended at Discharge Frequent or constant Supervision/Assistance  If plan is discharge home, recommend the following:  Can travel by private vehicle  A lot of help with walking and/or transfers;A lot of help with bathing/dressing/bathroom        Equipment Recommendations None recommended by PT  Recommendations for Other Services       Functional Status Assessment Patient has had a recent decline in their functional status and demonstrates the ability to make significant improvements in function in a reasonable and predictable amount of time.      Precautions / Restrictions Precautions Precautions: Fall      Mobility  Bed Mobility Overal bed mobility: Needs Assistance Bed Mobility: Supine to Sit     Supine to sit: Max assist, HOB elevated     General bed mobility comments: daughter present and reports pt needs assist typically at home, assist for LEs over EOB and trunk upright, utilized bed pad for positioning    Transfers Overall transfer level: Needs assistance Equipment used: Rolling walker (2 wheels) Transfers: Sit to/from Stand, Bed to chair/wheelchair/BSC Sit to Stand: Mod assist, +2 safety/equipment   Step pivot transfers: Min assist, +2 safety/equipment       General transfer comment: daughter provided cues as well, performed a couple steps forwards and backwards as daughter reports pt needs to be able to perform this to d/c home, assist to rise, steady and control descent; pt able to better assist once upright with stepping and turning, guided RW    Ambulation/Gait                  Stairs            Wheelchair Mobility     Tilt Bed    Modified Rankin (Stroke Patients Only)       Balance Overall balance assessment: Needs assistance         Standing balance support: Bilateral upper extremity supported, Reliant on assistive device for balance, During functional activity Standing balance-[name]: Poor                               Pertinent Vitals/Pain Pain Assessment Pain Assessment: PAINAD Breathing: normal Negative Vocalization: none  Facial Expression: smiling or inexpressive Body Language: relaxed Consolability: no need to console PAINAD Score: 0 Pain Intervention(s): Repositioned, Monitored during session    Home Living Family/patient expects to be discharged to:: Private residence Living Arrangements: Children Available Help at Discharge: Family;Personal care attendant;Available 24 hours/day Type of Home: House Home Access: Ramped entrance        Home Layout: One level Home Equipment: Agricultural consultant (2 wheels);BSC/3in1;Wheelchair - Careers adviser (comment)      Prior Function Prior Level of Function : Needs assist             Mobility Comments: able to take a few steps over to her recliner or BSC from bed, assist to stand, min/guard for transferring ADLs Comments: max assistance for all ADLs except feeding.     Hand Dominance        Extremity/Trunk Assessment        Lower Extremity Assessment Lower Extremity Assessment: Generalized weakness       Communication   Communication: No difficulties  Cognition Arousal/Alertness: Awake/alert Behavior During Therapy: Flat affect Overall Cognitive Status: History of cognitive impairments - at baseline                                 General Comments: easily falls asleep but awake/alert with activity, able to follow simple commands (does better when coming from her daughter, [name])        General Comments      Exercises     Assessment/Plan    PT Assessment Patient needs continued PT services  PT Problem List Decreased mobility;Decreased balance;Decreased strength;Decreased activity tolerance;Decreased knowledge of use of DME       PT Treatment Interventions DME instruction;Gait training;Balance training;Therapeutic exercise;Therapeutic activities;Functional mobility training;Patient/family education    PT Goals (Current goals can be found in the Care Plan section)  Acute Rehab PT Goals PT Goal Formulation: With patient/family Time For Goal Achievement: 06/16/23 Potential to Achieve Goals: Good    Frequency Min 1X/week     Co-evaluation               AM-PAC PT "6 Clicks" Mobility  Outcome Measure Help needed turning from your back to your side while in a flat bed without using bedrails?: Total Help needed moving from lying on your back to sitting on the side of a flat bed without using bedrails?: Total Help needed moving to and  from a bed to a chair (including a wheelchair)?: A Lot Help needed standing up from a chair using your arms (e.g., wheelchair or bedside chair)?: A Lot Help needed to walk in hospital room?: A Lot Help needed climbing 3-5 steps with a railing? : Total 6 Click Score: 9    End of Session Equipment Utilized During Treatment: Gait belt Activity Tolerance: Patient tolerated treatment well Patient left: in chair;with call bell/phone within reach;with chair alarm set;with family/visitor present Nurse Communication: Mobility status PT Visit Diagnosis: Other abnormalities of gait and mobility (R26.89);Unsteadiness on feet (R26.81)    Time: 6578-4696 PT Time Calculation (min) (ACUTE ONLY): 14 min   Charges:   PT Evaluation $PT Eval Low Complexity: 1 Low   PT General Charges $$ ACUTE PT VISIT: 1 Visit        Thomasene Mohair PT, DPT Physical Therapist Acute Rehabilitation Services Office: [phone]   Kati L Payson 06/02/2023, 1:02 PM

## 2023-06-03 DIAGNOSIS — R569 Unspecified convulsions: Secondary | ICD-10-CM | POA: Diagnosis not present

## 2023-06-03 LAB — BASIC METABOLIC PANEL
Anion gap: 7 (ref 5–15)
BUN: 11 mg/dL (ref 8–23)
CO2: 24 mmol/L (ref 22–32)
Calcium: 8.3 mg/dL — ABNORMAL LOW (ref 8.9–10.3)
Chloride: 109 mmol/L (ref 98–111)
Creatinine, Ser: 0.61 mg/dL (ref 0.44–1.00)
GFR, Estimated: >60 mL/min (ref >60)
Glucose, Bld: 95 mg/dL (ref 70–99)
Potassium: 3.7 mmol/L (ref 3.5–5.1)
Sodium: 140 mmol/L (ref 135–145)

## 2023-06-03 LAB — GLUCOSE, CAPILLARY
Glucose-Capillary: 83 mg/dL (ref 70–99)
Glucose-Capillary: 96 mg/dL (ref 70–99)
Glucose-Capillary: 67 mg/dL — ABNORMAL LOW (ref 70–99)
Glucose-Capillary: 78 mg/dL (ref 70–99)
Glucose-Capillary: 88 mg/dL (ref 70–99)

## 2023-06-03 LAB — CBC
HCT: 29.7 % — ABNORMAL LOW (ref 36.0–46.0)
Hemoglobin: 9.2 g/dL — ABNORMAL LOW (ref 12.0–15.0)
MCH: 25.6 pg — ABNORMAL LOW (ref 26.0–34.0)
MCHC: 31 g/dL (ref 30.0–36.0)
MCV: 82.7 fL (ref 80.0–100.0)
Platelets: 235 10*3/uL (ref 150–400)
RBC: 3.59 MIL/uL — ABNORMAL LOW (ref 3.87–5.11)
RDW: 15.5 % (ref 11.5–15.5)
WBC: 6.9 10*3/uL (ref 4.0–10.5)
nRBC: 0 % (ref 0.0–0.2)

## 2023-06-03 MED ORDER — DULOXETINE HCL 30 MG PO CPEP
30.0000 mg | ORAL_CAPSULE | Freq: Every day | ORAL | Status: DC
  Administered 2023-06-04 – 2023-06-08 (×5): 30 mg via ORAL
  Filled 2023-06-03 (×5): qty 1

## 2023-06-03 NOTE — Progress Notes (Signed)
Daily Progress Note   Patient Name: Andrea [name]       Date: 06/03/2023 DOB: 05-17-1935  Age: 88 y.o. MRN#: [id number] Attending Physician: Alwyn Ren, MD Primary Care Physician: Caesar Bookman, NP Admit Date: 05/30/2023  Reason for Consultation/Follow-up: Establishing goals of care  Subjective: Resting comfortably in bed, no family at bedside at the time of my visit, no distress noted.   Length of Stay: 4  Current Medications: Scheduled Meds:   apixaban  5 mg Oral BID   brexpiprazole  2 mg Oral QHS   busPIRone  10 mg Oral Daily   And   busPIRone  5 mg Oral QHS   Chlorhexidine Gluconate Cloth  6 each Topical Daily   cyanocobalamin  500 mcg Oral Daily   divalproex  500 mg Oral BID   donepezil  10 mg Oral Daily   [START ON 06/04/2023] DULoxetine  30 mg Oral Daily   levothyroxine  50 mcg Oral QAC breakfast   omega-3 acid ethyl esters  1 g Oral QPM   potassium chloride  40 mEq Oral Once   sodium chloride flush  10-40 mL Intracatheter Q12H    Continuous Infusions:  0.9 % NaCl with KCl 40 mEq / L 50 mL/hr at 06/02/23 2240    PRN Meds: acetaminophen **OR** acetaminophen, diazepam, diazepam, ondansetron **OR** ondansetron (ZOFRAN) IV, sodium chloride flush  Physical Exam         Chronically ill-appearing Regular work of breathing Abdomen is not distended Awakens easily Trace edema  Vital Signs: BP (!) 120/56 (BP Location: Right Arm)   Pulse 60   Temp 97.7 F (36.5 C) (Oral)   Resp 15   Ht 5\' 2"  (1.575 m)   Wt 87.6 kg   SpO2 99%   BMI 35.32 kg/m  SpO2: SpO2: 99 % O2 Device: O2 Device: Room Air O2 Flow Rate: O2 Flow Rate (L/min): 22 L/min  Intake/output summary:  Intake/Output Summary (Last 24 hours) at 06/03/2023 1114 Last data filed at 06/03/2023  0958 Gross per 24 hour  Intake 1070 ml  Output 2050 ml  Net -980 ml   LBM: Last BM Date : 06/02/23 Baseline Weight: Weight: 93 kg Most recent weight: Weight: 87.6 kg       Palliative Assessment/Data:      Patient Active Problem  List   Diagnosis Date Noted   Type 2 diabetes mellitus with hyperglycemia (HCC) 05/30/2023   Metabolic acidosis 05/30/2023   Malignant tumor of breast (HCC) 05/30/2023   Pneumonia 09/27/2022   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 (HCC) 01/19/2022   Seizures (HCC) 11/25/2020   Periprosthetic fracture of hip 08/08/2019   Right femoral fracture (HCC) 07/25/2019   Personal history of fall 07/03/2019   Cigarette nicotine dependence without complication 07/03/2019   Chronic right-sided low back pain with right-sided sciatica 07/03/2019   Overactive bladder 07/03/2019   Normocytic anemia 07/03/2019   Malignant neoplasm of upper-outer quadrant of left breast in female, estrogen receptor positive (HCC) 03/12/2019   Chronic deep vein thrombosis (DVT) of proximal vein of both lower extremities (HCC) 10/24/2018   Hypercoagulable state due to atrial fibrillation (HCC) 10/18/2017   Diabetes mellitus (HCC) 10/18/2017   Numbness of toes 11/27/2016   Advanced care planning/counseling discussion 04/20/2016   Obesity 04/20/2016   Depression 04/20/2016   Senile osteopenia 04/20/2016   Paroxysmal A-fib (HCC) 04/20/2016   Hyperlipidemia 04/20/2016   OSA on CPAP 10/12/2014   Raynaud phenomenon 10/12/2014   Chronic pain syndrome 10/12/2014   Mixed urge and stress incontinence 10/12/2014   Urinary incontinence, mixed 10/12/2014   Snoring 06/19/2014   Hypersomnia with sleep apnea, unspecified 06/19/2014   Nocturia more than twice per night 06/19/2014   Palpitations 06/10/2014   Urinary incontinence 06/10/2014   Scoliosis 06/10/2014   Hyperglycemia 03/05/2014   Insomnia 03/05/2014   Chronically dry eyes 02/12/2014   Insomnia  due to anxiety and fear 02/12/2014   Hallucination 01/18/2012   Lower urinary tract infectious disease 01/18/2012   Headache(784.0) 09/27/2011   H/O: encephalitis 09/27/2011   Hyperlipidemia associated with type 2 diabetes mellitus (HCC) 09/26/2011   Lumbar radiculopathy 09/26/2011   Muscle spasm of back 09/26/2011   Visual disturbance 09/26/2011   Falls frequently 09/25/2011   CVD (cerebrovascular disease) 09/25/2011   Hyponatremia 09/25/2011   Obesity, morbid (HCC) 09/25/2011   Delirium due to general medical condition 09/25/2011   Long term (current) use of anticoagulants 02/10/2011   Nonspecific (abnormal) findings on radiological and other examination of body structure 05/05/2010   ABNORMAL LUNG XRAY 05/05/2010   Edema 04/21/2010   DEPRESSION/ANXIETY 03/02/2010   HYPOTENSION 03/02/2010   DYSPNEA 03/02/2010   Cough 03/02/2010   CHEST PAIN 03/02/2010   Arthropathia 08/06/2009   Bipolar I disorder, single manic episode (HCC) 08/06/2009   Chronic pain associated with significant psychosocial dysfunction 08/06/2009   Clinical depression 08/06/2009   Adiposity 08/06/2009   Essential (primary) hypertension 08/06/2009   Acquired hypothyroidism 08/06/2009    Palliative Care Assessment & Plan   Patient Profile:    Assessment:  88 year old lady who lives at home with her daughter, history of atrial fibrillation anemia anxiety depression Alzheimer's dementia diabetes.  Chart reviewed, patient noted to have history of left-sided breast cancer and was under the care of Dr. Pamelia Hoit.    Patient admitted to the hospital medicine service because of worsening dementia and seizures.  Patient noted to have seizure type activity at home and then was in a postictal state, very somnolent. Patient admitted to the hospital for new onset seizures grand mal in the setting of dementia, prior history of encephalitis, prior history of breast cancer. MRI of the brain with no intracranial abnormality or  mass, bilateral hippocampal atrophy and severe chronic small vessel disease. EEG with moderate to severe diffuse encephalopathy. Patient placed on antiepileptics. Physical therapy  evaluation was done. Palliative consult for ongoing goals of care discussions has been requested.  Recommendations/Plan:  Recommend home with hospice. TOC consulted to help facilitate. No uncontrolled symptoms, no acute symptom management needs.     Code Status:    Code Status Orders  (From admission, onward)           Start     Ordered   05/30/23 0953  Do not attempt resuscitation (DNR)  Continuous       Question Answer Comment  If patient has no pulse and is not breathing Do Not Attempt Resuscitation   If patient has a pulse and/or is breathing: Medical Treatment Goals LIMITED ADDITIONAL INTERVENTIONS: Use medication/IV fluids and cardiac monitoring as indicated; Do not use intubation or mechanical ventilation (DNI), also provide comfort medications.  Transfer to Progressive/Stepdown as indicated, avoid Intensive Care.   Consent: Discussion documented in EHR or advanced directives reviewed      05/30/23 0955           Code Status History     Date Active Date Inactive Code Status Order ID Comments User Context   09/27/2022 1741 09/30/2022 1709 DNR [id number]  Dorcas Carrow, MD Inpatient   08/28/2019 1548 09/27/2022 1244 DNR [id number]  Bufford Spikes L Outpatient   07/25/2019 1200 08/06/2019 1659 DNR [id number]  Almon Hercules, MD ED   04/20/2016 1200 04/01/2019 0606 DNR [id number] Discussed at appt 04/20/16.  Pt reports [place] and is ready to go when it's time.  Does not want resuscitation.  Is going to complete a living will and hcpoa also.  [place] completed and given to her. Kermit Balo Outpatient   01/18/2012 1113 01/20/2012 2012 DNR [id number]  Alfonse Spruce, RN ED   09/25/2011 2251 09/28/2011 1523 Full Code [id number]  Valaria Good, RN Inpatient      Advance Directive  Documentation    Flowsheet Row Most Recent Value  Type of Advance Directive Out of facility DNR (pink MOST or yellow form), Healthcare Power of Attorney  Pre-existing out of facility DNR order (yellow form or pink MOST form) Yellow form placed in chart (order not valid for inpatient use)  "MOST" Form in Place? --       Prognosis:  < 6 months  Discharge Planning: Home with Hospice  Care plan was discussed with IDT  Thank you for allowing the Palliative Medicine Team to assist in the care of this patient. Low MDM.      Greater than 50%  of this time was spent counseling and coordinating care related to the above assessment and plan.  Rosalin Hawking, MD  Please contact Palliative Medicine Team phone at [phone] for questions and concerns.

## 2023-06-03 NOTE — Progress Notes (Signed)
Civil engineer, contracting [facility]) Hospital Liaison Note  Referral received for patient/family interest in hospice at home. [facility] liaison spoke with patient's daughter [name] to confirm interest. At this time, [name] states that they have an appointment at the Cancer center on 7/17.   She would like to wait and see the outcome of this meeting prior to making a decision about hospice.   Medical team aware. Should this patient need hospice services after this appointment, please call our HLT phone number.   [facility] Liaison [phone]

## 2023-06-03 NOTE — Progress Notes (Addendum)
PROGRESS NOTE    Andrea [name]  RUE:[id number] DOB: [birth year] DOA: 05/30/2023 PCP: Caesar Bookman, NP   Brief Narrative: 88 y.o. female with medical history significant of atrial fibrillation, anemia, anxiety, depression, Alzheimer's dementia, type 2 diabetes, incontinence, constipation, cervicalgia, headaches, hypothyroidism, hypertension, lumbar radiculopathy, history of pneumonia, prediabetes, sleep apnea not on CPAP, cervical cancer, history of left mastectomy who was brought to the emergency department due to worsening dementia and seizures.  Her daughter stated that she was awake around 0300 when she heard her mother made a noise.  She went to her room, found her seizing, rolling her eyes for about a minute and then became postictal.  She stated that for the last few months her dementia has been getting worse.  She used to have a nap in the morning and then nap in the afternoon, but lately she has not been taking her afternoon nap and instead has been showing sundowning symptoms.  She was seen recently by neurology who started her on memantine, but the patient did not tolerated well as she was very somnolent next day after starting with the night dose, so in that month he has not been continued by her family.  She has had good appetite.  No fever, URI symptoms, cough, emesis or diarrhea.  However, the patient is not able to fully express herself when it comes to her symptoms, given her advanced dementia.   Lab work: Her urine analysis was cloudy with ketones of 5 and protein of 100 mg/dL.[name]  There was large leukocyte esterase, negative nitrite, RBC 11-20, WBC more than 50, few bacteria, positive WBC clumps, mucus, budding yeast and hyaline casts.  CBCs are white count 12.8 with 64% neutrophils, 26% lymphocytes and 7% monocytes.  Hemoglobin 11.0 g/dL and platelets 147.  CMP showed a potassium of 3.1 and CO2 of 17 mmol/L with an anion gap of 18.  The rest of the electrolytes were normal.  Glucose  161, BUN 20 and creatinine 1.11 mg/dL.  LFTs were normal, except for an albumin level 3.4 g/dL.   Imaging: CT head without contrast showing no acute intracranial normalities.  There is moderate cerebral and mild cerebellar atrophy with extensive chronic microvascular ischemic changes in the cerebral white matter.   ED course: Initial vital signs were temperature 97.8 F, pulse 88, respiration 20, BP 130/81 mmHg O2 sat 97% on room air.  The patient received ceftriaxone 1 g IVPB, Keppra 1500 mg IVPB and lorazepam 1 mg IVP.  The case was discussed with neurology who will see the patient Wonda Olds, recommended EEG and MRI of brain.    Assessment & Plan:   Principal Problem:   Seizures (HCC) Active Problems:   DEPRESSION/ANXIETY   Lower urinary tract infectious disease   Chronic pain associated with significant psychosocial dysfunction   Essential (primary) hypertension   Acquired hypothyroidism   OSA on CPAP   Paroxysmal A-fib (HCC)   Hyperlipidemia   Chronic deep vein thrombosis (DVT) of proximal vein of both lower extremities (HCC)   Normocytic anemia   Type 2 diabetes mellitus with hyperglycemia (HCC)   Metabolic acidosis  #1 new onset seizures grand mal in the setting of dementia, prior history of encephalitis, breast cancer ?  UTI MRI brain no acute intracranial abnormality or mass.  Bilateral hippocampal atrophy.  Severe chronic small vessel disease. EEG moderate to severe diffuse encephalopathy. Keppra changed to Depakote by neurology As needed Ativan PT evaluation rec snf Will stop tramadol in view of  new onset seizures as it decreases the threshold Hold trazodone remeron tonight  On depakote new started since admit   #2 UTI UA with ketones and large amount of leukocyte  Finish today dose then dc rocephin  #3 depression and anxiety continue home meds trazodone duloxetine and mirtazapine  #4 essential hypertension on Lasix I will hold Lasix since she appears dry with  urine ketones.  #5 hypothyroidism on Synthroid last TSH done in April 2024 stable  #6 obstructive sleep apnea patient has not using CPAP  #7 paroxysmal atrial fibrillation on Eliquis  #8 DVT on Eliquis  #9 type 2 diabetes on ssi A1c 5.2  #10 hypokalemia replete   #11 anemia of chronic disease hemoglobin 8.4 from 11 on admission in 24 hours.  No obvious signs of bleeding noted.  Will check FOBT and iron panel indicates iron def anemia  #12 goals of care-patient is DNR discussed with patient's daughter who is her healthcare power of attorney.  Will consult palliative care as patient has been declining in the last few weeks to months. Will plan on dc home with hospice 7/16  Estimated body mass index is 35.32 kg/m as calculated from the following:   Height as of this encounter: 5\' 2"  (1.575 m).   Weight as of this encounter: 87.6 kg.  DVT prophylaxis: Eliquis  code Status: DNR Family Communication: None at bedside Disposition Plan:  Status is: Inpatient Remains inpatient appropriate because: New onset seizures   Consultants:  Neurology  Procedures: None Antimicrobials: Rocephin  Subjective:  Very drowsy Can't keep eyes open to eat  Objective: Vitals:   06/02/23 0518 06/02/23 2242 06/03/23 0643 06/03/23 0946  BP: (!) 152/62 (!) 142/61 (!) 144/79 (!) 120/56  Pulse: 63 66 60   Resp: 16 18 16 15   Temp: 97.8 F (36.6 C) 97.7 F (36.5 C) 97.6 F (36.4 C) 97.7 F (36.5 C)  TempSrc: Oral Oral Oral Oral  SpO2: 99% (!) 83% 97% 99%  Weight:      Height:        Intake/Output Summary (Last 24 hours) at 06/03/2023 1025 Last data filed at 06/03/2023 0958 Gross per 24 hour  Intake 1310 ml  Output 2051 ml  Net -741 ml   Filed Weights   05/30/23 1105 05/30/23 1140 05/30/23 1700  Weight: 93 kg 93 kg 87.6 kg    Examination:  General exam: Appears chronic  ill-appearing and frail Respiratory system: Clear to auscultation. Respiratory effort normal. Cardiovascular  system: S1 & S2 heard, RRR. No JVD, murmurs, rubs, gallops or clicks. No pedal edema. Gastrointestinal system: Abdomen is nondistended, soft and nontender. No organomegaly or masses felt. Normal bowel sounds heard. Central nervous system: Awake does not follow any commands extremities: Trace edema   Data Reviewed: I have personally reviewed following labs and imaging studies  CBC: Recent Labs  Lab 05/30/23 0438 05/31/23 0345 06/01/23 0404 06/03/23 0244  WBC 12.8* 5.8 5.7 6.9  NEUTROABS 8.4*  --   --   --   HGB 11.0* 8.4* 8.8* 9.2*  HCT 37.2 26.9* 28.4* 29.7*  MCV 86.7 83.3 82.8 82.7  PLT 348 223 246 235   Basic Metabolic Panel: Recent Labs  Lab 05/30/23 0438 05/31/23 0345 06/01/23 0404 06/03/23 0244  NA 139 138 139 140  K 3.1* 3.7 3.3* 3.7  CL 104 107 106 109  CO2 17* 25 26 24   GLUCOSE 161* 96 93 95  BUN 20 14 13 11   CREATININE 1.11* 0.81 0.67 0.61  CALCIUM 9.1 7.8* 8.2* 8.3*  MG 2.0  --   --   --   PHOS 3.0  --   --   --    GFR: Estimated Creatinine Clearance: 52.8 mL/min (by C-G formula based on SCr of 0.61 mg/dL). Liver Function Tests: Recent Labs  Lab 05/30/23 0438 05/31/23 0345 06/01/23 0404  AST 22 15 14*  ALT 11 10 9   ALKPHOS 61 45 45  BILITOT 0.3 0.4 0.4  PROT 7.0 5.3* 5.6*  ALBUMIN 3.4* 2.6* 2.6*   No results for input(s): "LIPASE", "AMYLASE" in the last 168 hours. No results for input(s): "AMMONIA" in the last 168 hours. Coagulation Profile: No results for input(s): "INR", "PROTIME" in the last 168 hours. Cardiac Enzymes: No results for input(s): "CKTOTAL", "CKMB", "CKMBINDEX", "TROPONINI" in the last 168 hours. BNP (last 3 results) No results for input(s): "PROBNP" in the last 8760 hours. HbA1C: No results for input(s): "HGBA1C" in the last 72 hours.  CBG: Recent Labs  Lab 06/02/23 0721 06/02/23 1147 06/02/23 1733 06/02/23 2247 06/03/23 0731  GLUCAP 72 103* 89 101* 83   Lipid Profile: No results for input(s): "CHOL", "HDL",  "LDLCALC", "TRIG", "CHOLHDL", "LDLDIRECT" in the last 72 hours. Thyroid Function Tests: No results for input(s): "TSH", "T4TOTAL", "FREET4", "T3FREE", "THYROIDAB" in the last 72 hours. Anemia Panel: Recent Labs    05/31/23 1212 06/01/23 0404  VITAMINB12 1,811*  --   FOLATE  --  14.9  FERRITIN  --  16  TIBC  --  283  IRON  --  18*  RETICCTPCT 1.7  --    Sepsis Labs: No results for input(s): "PROCALCITON", "LATICACIDVEN" in the last 168 hours.  Recent Results (from the past 240 hour(s))  Urine Culture (for pregnant, neutropenic or urologic patients or patients with an indwelling urinary catheter)     Status: None   Collection Time: 05/31/23 11:07 AM   Specimen: Urine, Clean Catch  Result Value Ref Range Status   Specimen Description   Final    URINE, CLEAN CATCH Performed at [facility], [address] [place], [place] [address]    Special Requests   Final    NONE Performed at [facility], [address] [place], [place] [address]    Culture   Final    NO GROWTH Performed at [facility], [address], [place], [place] [address]    Report Status 06/01/2023 FINAL  Final         Radiology Studies: No results found.      Scheduled Meds:  apixaban  5 mg Oral BID   brexpiprazole  2 mg Oral QHS   busPIRone  10 mg Oral Daily   And   busPIRone  5 mg Oral QHS   Chlorhexidine Gluconate Cloth  6 each Topical Daily   cyanocobalamin  500 mcg Oral Daily   divalproex  500 mg Oral BID   donepezil  10 mg Oral Daily   DULoxetine  90 mg Oral Daily   levothyroxine  50 mcg Oral QAC breakfast   mirtazapine  7.5 mg Oral QHS   omega-3 acid ethyl esters  1 g Oral QPM   potassium chloride  40 mEq Oral Once   sodium chloride flush  10-40 mL Intracatheter Q12H   traZODone  50 mg Oral QHS   Continuous Infusions:  0.9 % NaCl with KCl 40 mEq / L 50 mL/hr at 06/02/23 2240     LOS: 4 days  Time spent: 38 min  Alwyn Ren, MD 06/03/2023, 10:25 AM

## 2023-06-03 NOTE — Plan of Care (Signed)
  Problem: Safety: Goal: Ability to remain free from injury will improve 06/03/2023 0656 by Thea Gist, RN Outcome: Progressing 06/03/2023 0656 by Thea Gist, RN Outcome: Progressing   Problem: Skin Integrity: Goal: Risk for impaired skin integrity will decrease 06/03/2023 0656 by Thea Gist, RN Outcome: Progressing 06/03/2023 0656 by Thea Gist, RN Outcome: Progressing   Problem: Coping: Goal: Level of anxiety will decrease 06/03/2023 0656 by Thea Gist, RN Outcome: Progressing 06/03/2023 0656 by Thea Gist, RN Outcome: Progressing   Problem: Activity: Goal: Risk for activity intolerance will decrease 06/03/2023 0656 by Thea Gist, RN Outcome: Progressing 06/03/2023 0656 by Thea Gist, RN Outcome: Progressing

## 2023-06-03 NOTE — TOC Progression Note (Addendum)
Transition of Care [facility]) - Progression Note    Patient Details  Name: Andrea [name] MRN: [id number] Date of Birth: 19-Jun-1935  Transition of Care [facility]) CM/SW Contact  Coralyn Helling, [place] Phone Number: 06/03/2023, 11:13 AM  Clinical Narrative:    TOC spoke with daughter Andrea [name] via phone. Family agreeable to using [facility] for hospice services. Referral made with [facility] care liaison [facility].   3:24 PM Daughter now wanting to wait until after oncology appointment at cancer center on Wed before making a decision. Daughter to follow up with hospice following appointment.   Expected Discharge Plan: Home/Self Care Barriers to Discharge: Continued Medical Work up  Expected Discharge Plan and Services       Living arrangements for the past 2 months: Single Family Home                                       Social Determinants of Health (SDOH) Interventions SDOH Screenings   Food Insecurity: No Food Insecurity (05/30/2023)  Housing: Low Risk  (05/30/2023)  Transportation Needs: No Transportation Needs (05/30/2023)  Utilities: Not At Risk (05/30/2023)  Alcohol Screen: Low Risk  (10/24/2018)  Depression (PHQ2-9): Low Risk  (01/17/2023)  Financial Resource Strain: Low Risk  (12/03/2017)  Physical Activity: Inactive (12/03/2017)  Social Connections: Unknown (07/26/2022)   Received from [facility]  Stress: No Stress Concern Present (12/03/2017)  Tobacco Use: Medium Risk (05/30/2023)    Readmission Risk Interventions    05/31/2023   10:23 AM 09/30/2022   11:48 AM  Readmission Risk Prevention Plan  Transportation Screening Complete Complete  PCP or Specialist Appt within 5-7 Days  Complete  PCP or Specialist Appt within 3-5 Days Complete   Home Care Screening  Complete  Medication Review (RN CM)  Complete  HRI or Home Care Consult Complete   Social Work Consult for Recovery Care Planning/Counseling Complete   Palliative Care Screening Not Applicable   Medication  Review Oceanographer) Complete

## 2023-06-03 NOTE — Progress Notes (Signed)
Hypoglycemic Event  CBG: 67  Treatment: 8 oz juice/soda  Symptoms: None  Follow-up CBG: Time:15 CBG Result:78  Possible Reasons for Event: Inadequate meal intake     Farron Lafond, RN

## 2023-06-04 ENCOUNTER — Inpatient Hospital Stay (HOSPITAL_COMMUNITY): Payer: Medicare Other

## 2023-06-04 DIAGNOSIS — R569 Unspecified convulsions: Secondary | ICD-10-CM | POA: Diagnosis not present

## 2023-06-04 LAB — GLUCOSE, CAPILLARY
Glucose-Capillary: 84 mg/dL (ref 70–99)
Glucose-Capillary: 101 mg/dL — ABNORMAL HIGH (ref 70–99)
Glucose-Capillary: 87 mg/dL (ref 70–99)
Glucose-Capillary: 88 mg/dL (ref 70–99)

## 2023-06-04 LAB — VALPROIC ACID LEVEL: Valproic Acid Lvl: 73 ug/mL (ref 50.0–100.0)

## 2023-06-04 MED ORDER — SODIUM CHLORIDE (PF) 0.9 % IJ SOLN
INTRAMUSCULAR | Status: AC
  Filled 2023-06-04: qty 50

## 2023-06-04 MED ORDER — IOHEXOL 300 MG/ML  SOLN
100.0000 mL | Freq: Once | INTRAMUSCULAR | Status: AC | PRN
  Administered 2023-06-04: 100 mL via INTRAVENOUS

## 2023-06-04 NOTE — Progress Notes (Signed)
Physical Therapy Treatment Patient Details Name: Andrea [name] MRN: [id number] DOB: [birth year] Today's Date: 06/04/2023   History of Present Illness 88 y.o. female with medical history significant of atrial fibrillation, anemia, anxiety, depression, Alzheimer's dementia, type 2 diabetes, incontinence, constipation, cervicalgia, headaches, hypothyroidism, hypertension, back surgery,  sleep apnea not on CPAP, cervical cancer, breast cancer s/p left mastectomy who was brought to the emergency department due to worsening dementia and seizures.  Pt admitted 05/30/23 new onset seizures grand mal in the setting of dementia, prior history of encephalitis.  MRI brain no acute intracranial abnormality or mass.    PT Comments  Pt making gradual progress. Her daughter and [name] present during treatment and were able to provide cues to pt for transfers.  They confirm that pt is near her baseline and they would like to return home at d/c.  State they have necessary assist and DME.  Will continue POC and advancing/maintaining mobility as able while hospitalized.      Assistance Recommended at Discharge Frequent or constant Supervision/Assistance  If plan is discharge home, recommend the following:  Can travel by private vehicle    A lot of help with walking and/or transfers;A lot of help with bathing/dressing/bathroom      Equipment Recommendations  None recommended by PT    Recommendations for Other Services       Precautions / Restrictions Precautions Precautions: Fall Restrictions Weight Bearing Restrictions: No     Mobility  Bed Mobility Overal bed mobility: Needs Assistance Bed Mobility: Supine to Sit     Supine to sit: Mod assist     General bed mobility comments: Requiring assist for legs and to lift trunk - daughter and [name] confirm this is baseline    Transfers Overall transfer level: Needs assistance Equipment used: Rolling walker (2 wheels) Transfers: Sit to/from Stand Sit  to Stand: Min assist, +2 safety/equipment           General transfer comment: Cues to get legs under and lean forward.  Light min A to rise with hands placed on RW.  Pt's [name] assisting therapist.    Ambulation/Gait Ambulation/Gait assistance: +2 safety/equipment, Min assist Gait Distance (Feet): 3 Feet Assistive device: Rolling walker (2 wheels) Gait Pattern/deviations: Step-to pattern, Trunk flexed, Decreased stride length Gait velocity: decreased     General Gait Details: Small steps to chair with min A to balance and move RW.  Pt's daughter and [name] present and providing cues to pt.   Stairs             Wheelchair Mobility     Tilt Bed    Modified Rankin (Stroke Patients Only)       Balance Overall balance assessment: Needs assistance   Sitting balance-[name]: Fair Sitting balance - Comments: Could balance briefly at EOB but begins to lean R   Standing balance support: Reliant on assistive device for balance Standing balance-[name]: Poor Standing balance comment: RW and min A                            Cognition Arousal/Alertness: Awake/alert Behavior During Therapy: Flat affect Overall Cognitive Status: History of cognitive impairments - at baseline                                 General Comments: Daughter and [name] present        Exercises Other  Exercises Other Exercises: Knee flexion in sitting bil AAROM with 5 sec hold 10 reps.  Pt was able to get feet under her but noted knees extending in sitting.  Educated daughter and [name] importance of maintaining knee flexion ROM to assist with standing.    General Comments General comments (skin integrity, edema, etc.): VSS.      Pertinent Vitals/Pain Pain Assessment Pain Assessment: No/denies pain    Home Living                          Prior Function            PT Goals (current goals can now be found in the care plan section) Progress towards PT  goals: Progressing toward goals    Frequency    Min 1X/week      PT Plan Current plan remains appropriate    Co-evaluation              AM-PAC PT "6 Clicks" Mobility   Outcome Measure  Help needed turning from your back to your side while in a flat bed without using bedrails?: A Little Help needed moving from lying on your back to sitting on the side of a flat bed without using bedrails?: A Lot Help needed moving to and from a bed to a chair (including a wheelchair)?: A Lot Help needed standing up from a chair using your arms (e.g., wheelchair or bedside chair)?: A Lot Help needed to walk in hospital room?: A Lot Help needed climbing 3-5 steps with a railing? : Total 6 Click Score: 12    End of Session Equipment Utilized During Treatment: Gait belt Activity Tolerance: Patient tolerated treatment well Patient left: in chair;with call bell/phone within reach;with chair alarm set;with family/visitor present Nurse Communication: Mobility status PT Visit Diagnosis: Other abnormalities of gait and mobility (R26.89);Unsteadiness on feet (R26.81)     Time: 1030-1049 PT Time Calculation (min) (ACUTE ONLY): 19 min  Charges:    $Therapeutic Activity: 8-22 mins PT General Charges $$ ACUTE PT VISIT: 1 Visit                     Anise Salvo, PT Acute Rehab [facility] [phone]    Rayetta Humphrey 06/04/2023, 11:45 AM

## 2023-06-04 NOTE — Progress Notes (Signed)
PROGRESS NOTE    Andrea [name]  ZOX:[id number] DOB: August 21, 1935 DOA: 05/30/2023 PCP: Andrea Bookman, NP   Brief Narrative: 88 y.o. female with medical history significant of atrial fibrillation, anemia, anxiety, depression, Alzheimer's dementia, type 2 diabetes, incontinence, constipation, cervicalgia, headaches, hypothyroidism, hypertension, lumbar radiculopathy, history of pneumonia, prediabetes, sleep apnea not on CPAP, cervical cancer, history of left mastectomy who was brought to the emergency department due to worsening dementia and seizures.  Her daughter stated that she was awake around 0300 when she heard her mother made a noise.  She went to her room, found her seizing, rolling her eyes for about a minute and then became postictal.  She stated that for the last few months her dementia has been getting worse.  She used to have a nap in the morning and then nap in the afternoon, but lately she has not been taking her afternoon nap and instead has been showing sundowning symptoms.  She was seen recently by neurology who started her on memantine, but the patient did not tolerated well as she was very somnolent next day after starting with the night dose, so in that month he has not been continued by her family.  She has had good appetite.  No fever, URI symptoms, cough, emesis or diarrhea.  However, the patient is not able to fully express herself when it comes to her symptoms, given her advanced dementia.   Lab work: Her urine analysis was cloudy with ketones of 5 and protein of 100 mg/dL.[name]  There was large leukocyte esterase, negative nitrite, RBC 11-20, WBC more than 50, few bacteria, positive WBC clumps, mucus, budding yeast and hyaline casts.  CBCs are white count 12.8 with 64% neutrophils, 26% lymphocytes and 7% monocytes.  Hemoglobin 11.0 g/dL and platelets 811.  CMP showed a potassium of 3.1 and CO2 of 17 mmol/L with an anion gap of 18.  The rest of the electrolytes were normal.  Glucose  161, BUN 20 and creatinine 1.11 mg/dL.  LFTs were normal, except for an albumin level 3.4 g/dL.   Imaging: CT head without contrast showing no acute intracranial normalities.  There is moderate cerebral and mild cerebellar atrophy with extensive chronic microvascular ischemic changes in the cerebral white matter.   ED course: Initial vital signs were temperature 97.8 F, pulse 88, respiration 20, BP 130/81 mmHg O2 sat 97% on room air.  The patient received ceftriaxone 1 g IVPB, Keppra 1500 mg IVPB and lorazepam 1 mg IVP.  The case was discussed with neurology who will see the patient Wonda Olds, recommended EEG and MRI of brain.    Assessment & Plan:   Principal Problem:   Seizures (HCC) Active Problems:   DEPRESSION/ANXIETY   Lower urinary tract infectious disease   Chronic pain associated with significant psychosocial dysfunction   Essential (primary) hypertension   Acquired hypothyroidism   OSA on CPAP   Paroxysmal A-fib (HCC)   Hyperlipidemia   Chronic deep vein thrombosis (DVT) of proximal vein of both lower extremities (HCC)   Normocytic anemia   Type 2 diabetes mellitus with hyperglycemia (HCC)   Metabolic acidosis  #1 new onset seizures grand mal in the setting of dementia, prior history of encephalitis, breast cancer ?  UTI MRI brain no acute intracranial abnormality or mass.  Bilateral hippocampal atrophy.  Severe chronic small vessel disease. EEG moderate to severe diffuse encephalopathy. Keppra changed to Depakote by neurology As needed Ativan PT evaluation rec snf ?hospice on dc Daughter waiting to  speak with Dr. Georgiann Mohs prior to making any decision regarding hospice. Will stop tramadol in view of new onset seizures as it decreases the threshold Hold trazodone remeron tonight  On depakote new started since admit   #2 UTI UA with ketones and large amount of leukocyte  Completed a course of Rocephin  #3 depression and anxiety continue home meds trazodone duloxetine  and mirtazapine  #4 essential hypertension on Lasix I will hold Lasix since she appears dry with urine ketones.  #5 hypothyroidism on Synthroid last TSH done in April 2024 stable  #6 obstructive sleep apnea patient has not using CPAP  #7 paroxysmal atrial fibrillation on Eliquis  #8 DVT on Eliquis  #9 type 2 diabetes on ssi A1c 5.2  #10 hypokalemia replete   #11 anemia of chronic disease hemoglobin 8.4 from 11 on admission in 24 hours.  No obvious signs of bleeding noted.  Will check FOBT and iron panel indicates iron def anemia  #12 goals of care-patient is DNR discussed with patient's daughter who is her healthcare power of attorney.  Will consult palliative care as patient has been declining in the last few weeks to months. Will plan on dc home with hospice 7/16  Estimated body mass index is 35.32 kg/m as calculated from the following:   Height as of this encounter: 5\' 2"  (1.575 m).   Weight as of this encounter: 87.6 kg.  DVT prophylaxis: Eliquis  code Status: DNR Family Communication: Daughter at bedside Disposition Plan:  Status is: Inpatient Remains inpatient appropriate because: New onset seizures   Consultants:  Neurology  Procedures: None Antimicrobials: Rocephin  Subjective: Patient resting in bed daughter by the bedside  Objective: Vitals:   06/03/23 1706 06/03/23 1949 06/04/23 0600 06/04/23 0601  BP:  (!) 146/63  (!) 158/64  Pulse:  70 70 83  Resp: 19 20 17    Temp:  98.1 F (36.7 C) 97.6 F (36.4 C)   TempSrc:  Oral Oral   SpO2:  100% 100% 100%  Weight:      Height:        Intake/Output Summary (Last 24 hours) at 06/04/2023 1242 Last data filed at 06/04/2023 5784 Gross per 24 hour  Intake 950 ml  Output 1200 ml  Net -250 ml   Filed Weights   05/30/23 1105 05/30/23 1140 05/30/23 1700  Weight: 93 kg 93 kg 87.6 kg    Examination:  General exam: Appears chronic  ill-appearing and frail Respiratory system: Clear to auscultation. Respiratory  effort normal. Cardiovascular system: S1 & S2 heard, RRR. No JVD, murmurs, rubs, gallops or clicks. No pedal edema. Gastrointestinal system: Abdomen is nondistended, soft and nontender. No organomegaly or masses felt. Normal bowel sounds heard. Central nervous system: Awake does not follow any commands extremities: Trace edema   Data Reviewed: I have personally reviewed following labs and imaging studies  CBC: Recent Labs  Lab 05/30/23 0438 05/31/23 0345 06/01/23 0404 06/03/23 0244  WBC 12.8* 5.8 5.7 6.9  NEUTROABS 8.4*  --   --   --   HGB 11.0* 8.4* 8.8* 9.2*  HCT 37.2 26.9* 28.4* 29.7*  MCV 86.7 83.3 82.8 82.7  PLT 348 223 246 235   Basic Metabolic Panel: Recent Labs  Lab 05/30/23 0438 05/31/23 0345 06/01/23 0404 06/03/23 0244  NA 139 138 139 140  K 3.1* 3.7 3.3* 3.7  CL 104 107 106 109  CO2 17* 25 26 24   GLUCOSE 161* 96 93 95  BUN 20 14 13  11  CREATININE 1.11* 0.81 0.67 0.61  CALCIUM 9.1 7.8* 8.2* 8.3*  MG 2.0  --   --   --   PHOS 3.0  --   --   --    GFR: Estimated Creatinine Clearance: 52.8 mL/min (by C-G formula based on SCr of 0.61 mg/dL). Liver Function Tests: Recent Labs  Lab 05/30/23 0438 05/31/23 0345 06/01/23 0404  AST 22 15 14*  ALT 11 10 9   ALKPHOS 61 45 45  BILITOT 0.3 0.4 0.4  PROT 7.0 5.3* 5.6*  ALBUMIN 3.4* 2.6* 2.6*   No results for input(s): "LIPASE", "AMYLASE" in the last 168 hours. No results for input(s): "AMMONIA" in the last 168 hours. Coagulation Profile: No results for input(s): "INR", "PROTIME" in the last 168 hours. Cardiac Enzymes: No results for input(s): "CKTOTAL", "CKMB", "CKMBINDEX", "TROPONINI" in the last 168 hours. BNP (last 3 results) No results for input(s): "PROBNP" in the last 8760 hours. HbA1C: No results for input(s): "HGBA1C" in the last 72 hours.  CBG: Recent Labs  Lab 06/03/23 1643 06/03/23 1714 06/03/23 2042 06/04/23 0757 06/04/23 1153  GLUCAP 67* 78 88 84 101*   Lipid Profile: No results for  input(s): "CHOL", "HDL", "LDLCALC", "TRIG", "CHOLHDL", "LDLDIRECT" in the last 72 hours. Thyroid Function Tests: No results for input(s): "TSH", "T4TOTAL", "FREET4", "T3FREE", "THYROIDAB" in the last 72 hours. Anemia Panel: No results for input(s): "VITAMINB12", "FOLATE", "FERRITIN", "TIBC", "IRON", "RETICCTPCT" in the last 72 hours.  Sepsis Labs: No results for input(s): "PROCALCITON", "LATICACIDVEN" in the last 168 hours.  Recent Results (from the past 240 hour(s))  Urine Culture (for pregnant, neutropenic or urologic patients or patients with an indwelling urinary catheter)     Status: None   Collection Time: 05/31/23 11:07 AM   Specimen: Urine, Clean Catch  Result Value Ref Range Status   Specimen Description   Final    URINE, CLEAN CATCH Performed at [facility], [address], [place], [place] [address]    Special Requests   Final    NONE Performed at [facility], [address] [place], [place] [address]    Culture   Final    NO GROWTH Performed at [facility], [address] [place], [place] [address]    Report Status 06/01/2023 FINAL  Final         Radiology Studies: No results found.      Scheduled Meds:  apixaban  5 mg Oral BID   brexpiprazole  2 mg Oral QHS   busPIRone  10 mg Oral Daily   And   busPIRone  5 mg Oral QHS   Chlorhexidine Gluconate Cloth  6 each Topical Daily   cyanocobalamin  500 mcg Oral Daily   divalproex  500 mg Oral BID   donepezil  10 mg Oral Daily   DULoxetine  30 mg Oral Daily   levothyroxine  50 mcg Oral QAC breakfast   omega-3 acid ethyl esters  1 g Oral QPM   potassium chloride  40 mEq Oral Once   sodium chloride flush  10-40 mL Intracatheter Q12H   Continuous Infusions:  0.9 % NaCl with KCl 40 mEq / L 50 mL/hr at 06/03/23 1656     LOS: 5 days   Time spent: 38 min  Alwyn Ren, MD 06/04/2023, 12:42 PM

## 2023-06-04 NOTE — Plan of Care (Signed)
  Problem: Safety: Goal: Ability to remain free from injury will improve Outcome: Progressing   Problem: Skin Integrity: Goal: Risk for impaired skin integrity will decrease Outcome: Progressing   Problem: Coping: Goal: Level of anxiety will decrease Outcome: Progressing   Problem: Clinical Measurements: Goal: Ability to maintain clinical measurements within normal limits will improve Outcome: Progressing

## 2023-06-05 DIAGNOSIS — R4589 Other symptoms and signs involving emotional state: Secondary | ICD-10-CM

## 2023-06-05 DIAGNOSIS — Z515 Encounter for palliative care: Secondary | ICD-10-CM

## 2023-06-05 DIAGNOSIS — Z7189 Other specified counseling: Secondary | ICD-10-CM

## 2023-06-05 DIAGNOSIS — Z66 Do not resuscitate: Secondary | ICD-10-CM

## 2023-06-05 DIAGNOSIS — R569 Unspecified convulsions: Principal | ICD-10-CM

## 2023-06-05 DIAGNOSIS — C50919 Malignant neoplasm of unspecified site of unspecified female breast: Secondary | ICD-10-CM | POA: Diagnosis not present

## 2023-06-05 LAB — GLUCOSE, CAPILLARY
Glucose-Capillary: 71 mg/dL (ref 70–99)
Glucose-Capillary: 117 mg/dL — ABNORMAL HIGH (ref 70–99)
Glucose-Capillary: 102 mg/dL — ABNORMAL HIGH (ref 70–99)
Glucose-Capillary: 120 mg/dL — ABNORMAL HIGH (ref 70–99)

## 2023-06-05 NOTE — TOC Progression Note (Signed)
Transition of Care [facility]) - Progression Note    Patient Details  Name: Andrea [name] MRN: [id number] Date of Birth: [birth year]  Transition of Care [facility]) CM/SW Contact  Howell Rucks, RN Phone Number: 06/05/2023, 11:05 AM  Clinical Narrative:  Teams text received from Palliative Medicine Team,  Dr. Jerolyn Center already spoke with patient's daughter this morning and plan is for home with hospice Friday. Dionicio Stall, [facility] notified. TOC will continue to follow.     Expected Discharge Plan: Home/Self Care Barriers to Discharge: Continued Medical Work up  Expected Discharge Plan and Services       Living arrangements for the past 2 months: Single Family Home                                       Social Determinants of Health (SDOH) Interventions SDOH Screenings   Food Insecurity: No Food Insecurity (05/30/2023)  Housing: Low Risk  (05/30/2023)  Transportation Needs: No Transportation Needs (05/30/2023)  Utilities: Not At Risk (05/30/2023)  Alcohol Screen: Low Risk  (10/24/2018)  Depression (PHQ2-9): Low Risk  (01/17/2023)  Financial Resource Strain: Low Risk  (12/03/2017)  Physical Activity: Inactive (12/03/2017)  Social Connections: Unknown (07/26/2022)   Received from [facility]  Stress: No Stress Concern Present (12/03/2017)  Tobacco Use: Medium Risk (05/30/2023)    Readmission Risk Interventions    05/31/2023   10:23 AM 09/30/2022   11:48 AM  Readmission Risk Prevention Plan  Transportation Screening Complete Complete  PCP or Specialist Appt within 5-7 Days  Complete  PCP or Specialist Appt within 3-5 Days Complete   Home Care Screening  Complete  Medication Review (RN CM)  Complete  HRI or Home Care Consult Complete   Social Work Consult for Recovery Care Planning/Counseling Complete   Palliative Care Screening Not Applicable   Medication Review Ocean[employer]) Complete

## 2023-06-05 NOTE — Care Management Important Message (Signed)
Important Message  Patient Details No IM Letter given due to home with Hospice. Name: Andrea [name] MRN: [id number] Date of Birth: 1935-09-02   Medicare Important Message Given:  No     Caren Macadam 06/05/2023, 11:10 AM

## 2023-06-05 NOTE — Progress Notes (Signed)
PROGRESS NOTE    Andrea [name]  ZOX:[id number] DOB: 1935/09/12 DOA: 05/30/2023 PCP: Caesar Bookman, NP   Brief Narrative: 88 y.o. female with medical history significant of atrial fibrillation, anemia, anxiety, depression, Alzheimer's dementia, type 2 diabetes, incontinence, constipation, cervicalgia, headaches, hypothyroidism, hypertension, lumbar radiculopathy, history of pneumonia, prediabetes, sleep apnea not on CPAP, cervical cancer, history of left mastectomy who was brought to the emergency department due to worsening dementia and seizures.  Her daughter witnessed her mom having seizure activity and she was brought to the hospital.  CT head without contrast showing no acute intracranial normalities.  There is moderate cerebral and mild cerebellar atrophy with extensive chronic microvascular ischemic changes in the cerebral white matter.   Assessment & Plan:   Principal Problem:   Seizures (HCC) Active Problems:   DEPRESSION/ANXIETY   Lower urinary tract infectious disease   Chronic pain associated with significant psychosocial dysfunction   Essential (primary) hypertension   Acquired hypothyroidism   OSA on CPAP   Paroxysmal A-fib (HCC)   Hyperlipidemia   Chronic deep vein thrombosis (DVT) of proximal vein of both lower extremities (HCC)   Normocytic anemia   Type 2 diabetes mellitus with hyperglycemia (HCC)   Metabolic acidosis   Need for emotional support   DNR (do not resuscitate)   Counseling and coordination of care   Goals of care, counseling/discussion   Seizure (HCC)   Palliative care encounter  #1 new onset seizures grand mal in the setting of dementia, prior history of encephalitis, breast cancer -MRI brain no acute intracranial abnormality or mass.   EEG moderate to severe diffuse encephalopathy. Keppra changed to Depakote by neurology as patient had history of depression.  She has not had any further seizures in the hospital.  Patient was seen by palliative  care.  Patient was seen by Dr. Georgiann Mohs. CT of the abdomen and pelvis shows possible metastasis in the liver and lungs.  Discussed with patient's daughter patient to be discharged home with hospice on Friday.   #2 UTI UA with ketones and large amount of leukocyte it was thought initially she had a UTI however the urine culture came back negative. Completed a course of Rocephin  #3 depression and anxiety-continue home meds  #4 essential hypertension on Lasix I will hold Lasix since she appears dry with urine ketones.  #5 hypothyroidism on Synthroid last TSH done in April 2024 stable  #6 obstructive sleep apnea patient has not using CPAP  #7 paroxysmal atrial fibrillation on Eliquis  #8 DVT on Eliquis  #9 type 2 diabetes on ssi A1c 5.2  #10 hypokalemia repleted  #11 anemia of chronic disease hemoglobin stable  #12 goals of care-patient is DNR discussed with patient's daughter who is her healthcare power of attorney. Home with hospice on Friday. Estimated body mass index is 35.32 kg/m as calculated from the following:   Height as of this encounter: 5\' 2"  (1.575 m).   Weight as of this encounter: 87.6 kg.  DVT prophylaxis: Eliquis  code Status: DNR Family Communication: Daughter at bedside Disposition Plan:  Status is: Inpatient Remains inpatient appropriate because: New onset seizures   Consultants:  Neurology  Procedures: None Antimicrobials: Rocephin  Subjective:   She is more awake daughter by the bedside Objective: Vitals:   06/04/23 1254 06/04/23 2352 06/05/23 0547 06/05/23 1407  BP: 117/74 118/72 (!) 159/72 (!) 112/57  Pulse: 99 67 68 75  Resp: 20  18 20   Temp: 98.6 F (37 C) 98.4 F (36.9  C) (!) 97.5 F (36.4 C) 98.4 F (36.9 C)  TempSrc: Oral Oral Oral Oral  SpO2: 99% 98% 100% 97%  Weight:      Height:        Intake/Output Summary (Last 24 hours) at 06/05/2023 1523 Last data filed at 06/05/2023 1330 Gross per 24 hour  Intake 1440 ml  Output 1250 ml   Net 190 ml   Filed Weights   05/30/23 1105 05/30/23 1140 05/30/23 1700  Weight: 93 kg 93 kg 87.6 kg    Examination:  General exam: Appears chronic  ill-appearing and frail Respiratory system: Clear to auscultation. Respiratory effort normal. Cardiovascular system: S1 & S2 heard, RRR. No JVD, murmurs, rubs, gallops or clicks. No pedal edema. Gastrointestinal system: Abdomen is nondistended, soft and nontender. No organomegaly or masses felt. Normal bowel sounds heard. Central nervous system: Awake  extremities: Trace edema   Data Reviewed: I have personally reviewed following labs and imaging studies  CBC: Recent Labs  Lab 05/30/23 0438 05/31/23 0345 06/01/23 0404 06/03/23 0244  WBC 12.8* 5.8 5.7 6.9  NEUTROABS 8.4*  --   --   --   HGB 11.0* 8.4* 8.8* 9.2*  HCT 37.2 26.9* 28.4* 29.7*  MCV 86.7 83.3 82.8 82.7  PLT 348 223 246 235   Basic Metabolic Panel: Recent Labs  Lab 05/30/23 0438 05/31/23 0345 06/01/23 0404 06/03/23 0244  NA 139 138 139 140  K 3.1* 3.7 3.3* 3.7  CL 104 107 106 109  CO2 17* 25 26 24   GLUCOSE 161* 96 93 95  BUN 20 14 13 11   CREATININE 1.11* 0.81 0.67 0.61  CALCIUM 9.1 7.8* 8.2* 8.3*  MG 2.0  --   --   --   PHOS 3.0  --   --   --    GFR: Estimated Creatinine Clearance: 52.8 mL/min (by C-G formula based on SCr of 0.61 mg/dL). Liver Function Tests: Recent Labs  Lab 05/30/23 0438 05/31/23 0345 06/01/23 0404  AST 22 15 14*  ALT 11 10 9   ALKPHOS 61 45 45  BILITOT 0.3 0.4 0.4  PROT 7.0 5.3* 5.6*  ALBUMIN 3.4* 2.6* 2.6*   No results for input(s): "LIPASE", "AMYLASE" in the last 168 hours. No results for input(s): "AMMONIA" in the last 168 hours. Coagulation Profile: No results for input(s): "INR", "PROTIME" in the last 168 hours. Cardiac Enzymes: No results for input(s): "CKTOTAL", "CKMB", "CKMBINDEX", "TROPONINI" in the last 168 hours. BNP (last 3 results) No results for input(s): "PROBNP" in the last 8760 hours. HbA1C: No results  for input(s): "HGBA1C" in the last 72 hours.  CBG: Recent Labs  Lab 06/04/23 1153 06/04/23 1635 06/04/23 2153 06/05/23 0731 06/05/23 1150  GLUCAP 101* 87 88 71 117*   Lipid Profile: No results for input(s): "CHOL", "HDL", "LDLCALC", "TRIG", "CHOLHDL", "LDLDIRECT" in the last 72 hours. Thyroid Function Tests: No results for input(s): "TSH", "T4TOTAL", "FREET4", "T3FREE", "THYROIDAB" in the last 72 hours. Anemia Panel: No results for input(s): "VITAMINB12", "FOLATE", "FERRITIN", "TIBC", "IRON", "RETICCTPCT" in the last 72 hours.  Sepsis Labs: No results for input(s): "PROCALCITON", "LATICACIDVEN" in the last 168 hours.  Recent Results (from the past 240 hour(s))  Urine Culture (for pregnant, neutropenic or urologic patients or patients with an indwelling urinary catheter)     Status: None   Collection Time: 05/31/23 11:07 AM   Specimen: Urine, Clean Catch  Result Value Ref Range Status   Specimen Description   Final    URINE, CLEAN CATCH Performed at  [facility], [address], [place], [place] [address]    Special Requests   Final    NONE Performed at [facility], [address], [place], [place] [address]    Culture   Final    NO GROWTH Performed at [facility], [address] [place], [place] [address]    Report Status 06/01/2023 FINAL  Final         Radiology Studies: CT CHEST ABDOMEN PELVIS W CONTRAST  Result Date: 06/04/2023 CLINICAL DATA:  Breast cancer.  * Tracking Code: BO * EXAM: CT CHEST, ABDOMEN, AND PELVIS WITH CONTRAST TECHNIQUE: Multidetector CT imaging of the chest, abdomen and pelvis was performed following the standard protocol during bolus administration of intravenous contrast. RADIATION DOSE REDUCTION: This exam was performed according to the departmental dose-optimization program which includes automated exposure control, adjustment of the mA and/or kV according to patient size and/or use of  iterative reconstruction technique. CONTRAST:  OMNIPAQUE IOHEXOL 300 MG/ML  SOLN COMPARISON:  CT abdomen pelvis 07/31/2019. FINDINGS: CT CHEST FINDINGS Cardiovascular: Right IJ Port-A-Cath terminates in the right atrium. Atherosclerotic calcification of the aorta. Heart is at the upper limits of normal in size. No pericardial effusion. Mediastinum/Nodes: No pathologically enlarged mediastinal, hilar, internal mammary or axillary lymph nodes. Central left breast mass is seen slightly superior to the nipple areolar complex and measures 2.9 x 3.1 cm (2/46). Esophagus is grossly unremarkable. Lungs/Pleura: Image quality is degraded by respiratory motion. Hematogenously distributed pulmonary nodules measure up to 12 mm in the posterior left lower lobe (5/96). Those seen in the lower lung zones are new from 07/31/2019. Dependent pleural thickening bilaterally. Airway is unremarkable. Musculoskeletal: Degenerative changes in the spine. No worrisome lytic or sclerotic lesions. CT ABDOMEN PELVIS FINDINGS Hepatobiliary: 2 vague hypodense lesions in the right hepatic lobe measure up to 10 mm (2/49) additional cysts in the liver. Liver and gallbladder are otherwise unremarkable. No biliary ductal dilatation. Pancreas: Negative. Spleen: Negative. Adrenals/Urinary Tract: Adrenal glands and right kidney are unremarkable. Cysts in the left kidney. No specific follow-up necessary. Small left renal stones. Ureters are decompressed. There may be slight ventral bladder wall thickening. Stomach/Bowel: Posterior gastric diverticulum. Duodenal diverticulum. Stomach, small bowel and colon are otherwise unremarkable. Appendix is not readily visualized. Vascular/Lymphatic: Atherosclerotic calcification of the aorta. No pathologically enlarged lymph nodes. Reproductive: Uterus is visualized.  No adnexal mass. Other: No free fluid. Small bilateral inguinal hernias contain fat. Ventral hernia repair. Mesenteries and peritoneum are  unremarkable. Musculoskeletal: Osteopenia. Degenerative changes in the spine. No worrisome lytic or sclerotic lesions. IMPRESSION: 1. Left breast mass with bilateral lung nodules and right hepatic lobe lesions, consistent with metastatic breast cancer. 2. Small left renal stones. 3.  Aortic atherosclerosis (ICD10-I70.0). Electronically Signed   By: Leanna Battles M.D.   On: 06/04/2023 16:43        Scheduled Meds:  apixaban  5 mg Oral BID   brexpiprazole  2 mg Oral QHS   busPIRone  10 mg Oral Daily   And   busPIRone  5 mg Oral QHS   Chlorhexidine Gluconate Cloth  6 each Topical Daily   cyanocobalamin  500 mcg Oral Daily   divalproex  500 mg Oral BID   donepezil  10 mg Oral Daily   DULoxetine  30 mg Oral Daily   levothyroxine  50 mcg Oral QAC breakfast   omega-3 acid ethyl esters  1 g Oral QPM   sodium chloride flush  10-40 mL  Intracatheter Q12H   Continuous Infusions:  0.9 % NaCl with KCl 40 mEq / L 50 mL/hr at 06/05/23 1036     LOS: 6 days   Time spent: 38 min  Alwyn Ren, MD 06/05/2023, 3:23 PM

## 2023-06-05 NOTE — Progress Notes (Signed)
Oncology I discussed with the patient and her family about the diagnosis of metastatic breast cancer based upon the recent CT scan findings of distant metastatic disease in both the lungs and the liver.  We determined that the best course of action for the recurrence of her breast cancer is hospice care.  There is no role of surgery.  Based on her performance status I suspect that patient has a very short life expectancy.  Patient's family is agreeable to hospice care.  Thank you for involving [place] in the care.  Please do not hesitate to call for any questions.

## 2023-06-05 NOTE — Progress Notes (Signed)
Daily Progress Note   Patient Name: Andrea [name]       Date: 06/05/2023 DOB: 30-Oct-1935  Age: 88 y.o. MRN#: [id number] Attending Physician: Alwyn Ren, MD Primary Care Physician: Caesar Bookman, NP Admit Date: 05/30/2023 Length of Stay: 6 days  Reason for Consultation/Follow-up: Establishing goals of care  Subjective:   CC: Patient sleeping comfortably when seen this morning.  Discussed care with patient's caregiver, Lynden Ang, at bedside who is coordinating with daughter regarding planning since she helps care for patient at daughter's home.  Following up regarding complex medical decision making.  Subjective:  Reviewed EMR prior to presenting to bedside.  Patient had been seen by palliative provider, Dr. Linna Darner, on 7/14 and discussion had been for patient to return home with hospice.  [facility] hospice liaison reached out the daughter then wanted to await further input from patient's oncologist, Dr. Pamelia Hoit, before deciding to return home with hospice support. Review CT which was ordered yesterday by Dr. Pamelia Hoit.   Presented to bedside to check on patient.  Patient sleeping comfortably when presenting so did not awaken.  Patient's caregiver, Lynden Ang, present at bedside.  Introduced myself as a member of the palliative medicine team.  Lynden Ang updated me that she had already discussed with patient's daughter today regarding review of CT scan ordered by oncologist yesterday.  Oncologist did stop by and speak with daughter yesterday.  Chip Boer is awaiting update from daughter regarding plan and when patient will return home since she helps care for patient at daughter's home.  Caregiver did not voice any concerns regarding symptom management. Did provide emotional support via active listening while present. Patient appeared comfortable when seen.  Noted would follow-up with IDT regarding plans for patient's support moving forward and when patient will be able to return home.  All questions answered at  that time.  Thanked them for allowing me to visit today.  Followed up with hospitalist after visit with patient.  Hospitalist updated already spoke with daughter this morning.  Plan is for patient to return home with hospice support on Friday.  Acknowledged decision made.  Assisted with coordination between hospitalist, TOC, and [facility] liaison.  Noted palliative medicine team will continue to follow along with patient's medical journey.  Objective:   Vital Signs:  BP (!) 159/72 (BP Location: Right Arm)   Pulse 68   Temp (!) 97.5 F (36.4 C) (Oral)   Resp 18   Ht 5\' 2"  (1.575 m)   Wt 87.6 kg   SpO2 100%   BMI 35.32 kg/m   Physical Exam: General: NAD, sleeping in bed, chronically ill appearing, frail, cachectic  Eyes: No drainage noted HENT: Dry mucous membranes Cardiovascular: RRR Respiratory: no increased work of breathing noted, not in respiratory distress Abdomen: not distended Neuro: Sleeping  Imaging:  I personally reviewed recent imaging.   Assessment & Plan:   Assessment: Patient is an 88 year old female with a past medical history of atrial fibrillation, anemia, anxiety/depression, Alzheimer's dementia, diabetes, incontinence, cervical cancer, breast cancer, and sleep apnea not on CPAP who was admitted on 05/30/2023 for management of worsening dementia and seizures.  Since admission, patient has been managed for new onset grand mal seizures in the setting of dementia with a prior history of encephalitis.  Palliative medicine team consulted to assist with complex medical decision making.  Recommendations/Plan: # Complex medical decision making/goals of care:  - Patient unable to participate in complex medical decision making due to medical status.  -Discussed care with patient's caregiver  at bedside and patient's primary hospitalist.  Hospitalist had discussed with daughter today the plan is for patient to return to her home on Friday with hospice support. Assisted with  coordination of care. Patient will be returning home [facility] hospice support.   -  Code Status: DNR Prognosis: < 6 months  # Symptom management:  -As per primary hosptialist   # Psychosocial Support:  -daughter, caregiver  # Discharge Planning: Home with Hospice via [facility]  Discussed with: patient's caregiver at bedside, RN, hospitalist, [facility], [facility] liaison   Thank you for allowing the palliative care team to participate in the care Romilda Garret.  Alvester Morin, DO Palliative Care Provider PMT # [phone]  If patient remains symptomatic despite maximum doses, please call PMT at [phone] between 0700 and 1900. Outside of these hours, please call attending, as PMT does not have night coverage.  This provider spent a total of 35 minutes providing patient's care.  Includes review of EMR, discussing care with other staff members involved in patient's medical care, obtaining relevant history and information from patient and/or patient's family, and personal review of imaging and lab work. Greater than 50% of the time was spent counseling and coordinating care related to the above assessment and plan.    *Please note that this is a verbal dictation therefore any spelling or grammatical errors are due to the "Dragon [employer] One" system interpretation.

## 2023-06-05 NOTE — Progress Notes (Signed)
Civil engineer, contracting [facility]) Hospital Liaison Note  Referral received for patient/family interest in hospice at home. [facility] liaison spoke with patient's daughter Andrea [name] to confirm interest. Interest confirmed.   Plan is to discharge home on Friday.   No DME needed at this time. [facility] admission nurse will assess on admission.   Please send comfort medications/prescriptions with patient at discharge to hold them over until [facility] is able to admit them to hospice services.   Please call with any questions or concerns. Thank you  Dionicio Stall, [facility] Liaison [phone]

## 2023-06-06 ENCOUNTER — Inpatient Hospital Stay: Payer: Medicare Other | Admitting: Hematology and Oncology

## 2023-06-06 ENCOUNTER — Inpatient Hospital Stay: Payer: Medicare Other

## 2023-06-06 DIAGNOSIS — Z7189 Other specified counseling: Secondary | ICD-10-CM

## 2023-06-06 DIAGNOSIS — R569 Unspecified convulsions: Secondary | ICD-10-CM | POA: Diagnosis not present

## 2023-06-06 LAB — GLUCOSE, CAPILLARY
Glucose-Capillary: 90 mg/dL (ref 70–99)
Glucose-Capillary: 116 mg/dL — ABNORMAL HIGH (ref 70–99)
Glucose-Capillary: 112 mg/dL — ABNORMAL HIGH (ref 70–99)
Glucose-Capillary: 108 mg/dL — ABNORMAL HIGH (ref 70–99)

## 2023-06-06 NOTE — Progress Notes (Signed)
Physical Therapy Treatment Patient Details Name: Andrea [name] MRN: [id number] DOB: [birth year] Today's Date: 06/06/2023   History of Present Illness 88 y.o. female with medical history significant of atrial fibrillation, anemia, anxiety, depression, Alzheimer's dementia, type 2 diabetes, incontinence, constipation, cervicalgia, headaches, hypothyroidism, hypertension, back surgery,  sleep apnea not on CPAP, cervical cancer, breast cancer s/p left mastectomy who was brought to the emergency department due to worsening dementia and seizures.  Pt admitted 05/30/23 new onset seizures grand mal in the setting of dementia, prior history of encephalitis.  MRI brain no acute intracranial abnormality or mass.    PT Comments   Pt admitted with above diagnosis.  Pt currently with functional limitations due to the deficits listed below (see PT Problem List). Pt in bed when PT arrived, [name] present and indicated pt has been sleepy today with limited PO intake.. PT able to easily rouse pt and agreeable to getting OOB. Pt required max A for supine to sit for B LE and trunk with pt demonstrating strong posterior lean, [name] present and provided additional cues, once seated EOB pt demonstrated increased alertness, verbal communication and participation. Pt able to maintain sitting balance EOB with min guard and multimodal cues for attention to midline. Pt required A to place B UE on RW and for proper LE placement on floor. Pt required mod A for sit to stand  from EOB, once in standing B UE support and min A, min A x 2 for SPT bed to recliner with cues, PT providing RW management. Pt positioned in chair per pt preference indicating that she was cold, all needs in place and [name] remains.  will benefit from acute skilled PT to increase their independence and safety with mobility to allow discharge.       Assistance Recommended at Discharge Frequent or constant Supervision/Assistance  If plan is discharge home, recommend the  following:  Can travel by private vehicle    A lot of help with walking and/or transfers;A lot of help with bathing/dressing/bathroom      Equipment Recommendations  None recommended by PT    Recommendations for Other Services       Precautions / Restrictions Precautions Precautions: Fall Restrictions Weight Bearing Restrictions: No     Mobility  Bed Mobility Overal bed mobility: Needs Assistance Bed Mobility: Supine to Sit     Supine to sit: Max assist     General bed mobility comments: Requiring assist for legs and to lift trunk with cues and increased time with minimal initiation of movement, once seated EOB pt became more alert and able to engage with sitting balance and remainder of tx intervention    Transfers Overall transfer level: Needs assistance Equipment used: Rolling walker (2 wheels) Transfers: Sit to/from Stand Sit to Stand: +2 safety/equipment, Mod assist, From elevated surface   Step pivot transfers: Min assist, +2 safety/equipment       General transfer comment: Cues to get legs under, appropriate BOS and lean forward.  Hand over hand assist to place B UE on RW.  Cues and pt able to initate movement, once in standing pt required min A x 2 to complete SPT at [facility] with therapist managing RW to recliner.  Pt's [name] assisting therapist.    Ambulation/Gait               General Gait Details: NT for pt and staff safety due to pt limited inititation of movement and required physical assist   Stairs  Wheelchair Mobility     Tilt Bed    Modified Rankin (Stroke Patients Only)       Balance Overall balance assessment: Needs assistance Sitting-balance support: Feet supported Sitting balance-[name]: Fair Sitting balance - Comments: min guard for mulimodal cues for attention to midline   Standing balance support: Reliant on assistive device for balance Standing balance-[name]: Poor Standing balance comment: RW and  min A                            Cognition Arousal/Alertness: Lethargic Behavior During Therapy: Flat affect Overall Cognitive Status: History of cognitive impairments - at baseline                                 General Comments: [name] present        Exercises      General Comments        Pertinent Vitals/Pain Pain Assessment Pain Assessment: No/denies pain Breathing: normal Negative Vocalization: none Facial Expression: smiling or inexpressive Body Language: relaxed Consolability: no need to console PAINAD Score: 0    Home Living                          Prior Function            PT Goals (current goals can now be found in the care plan section) Acute Rehab PT Goals PT Goal Formulation: With patient/family Time For Goal Achievement: 06/16/23 Potential to Achieve Goals: Good Progress towards PT goals:  (limited progression toward goals due to medical complexity and underlying dementia)    Frequency    Min 1X/week      PT Plan Current plan remains appropriate;Discharge plan needs to be updated    Co-evaluation              AM-PAC PT "6 Clicks" Mobility   Outcome Measure  Help needed turning from your back to your side while in a flat bed without using bedrails?: A Little Help needed moving from lying on your back to sitting on the side of a flat bed without using bedrails?: A Lot Help needed moving to and from a bed to a chair (including a wheelchair)?: A Lot Help needed standing up from a chair using your arms (e.g., wheelchair or bedside chair)?: A Lot Help needed to walk in hospital room?: A Lot Help needed climbing 3-5 steps with a railing? : Total 6 Click Score: 12    End of Session Equipment Utilized During Treatment: Gait belt Activity Tolerance: Patient tolerated treatment well Patient left: in chair;with call bell/phone within reach;with chair alarm set;with family/visitor present Nurse  Communication: Mobility status PT Visit Diagnosis: Other abnormalities of gait and mobility (R26.89);Unsteadiness on feet (R26.81)     Time: 1013-1030 PT Time Calculation (min) (ACUTE ONLY): 17 min  Charges:    $Therapeutic Activity: 8-22 mins PT General Charges $$ ACUTE PT VISIT: 1 Visit                     Andrea [name], PT Acute Rehab    Andrea [name] 06/06/2023, 11:13 AM

## 2023-06-06 NOTE — Hospital Course (Signed)
Ms. Detjen is an 88 yo female with PMH atrial fibrillation, anemia, anxiety, depression, Alzheimer's dementia, type 2 diabetes, incontinence, constipation, cervicalgia, headaches, hypothyroidism, hypertension, lumbar radiculopathy, history of pneumonia, prediabetes, sleep apnea not on CPAP, cervical cancer, history of left mastectomy who was brought to the emergency department due to worsening dementia and concern for seizure. Her daughter witnessed patient having seizure activity and she was brought to the hospital. There was no reported post ictal period following shaking episodes.  CT head without contrast showing no acute intracranial normalities.  There is moderate cerebral and mild cerebellar atrophy with extensive chronic microvascular ischemic changes in the cerebral white matter. EEG showed moderate to severe diffuse encephalopathy of nonspecific etiology but consider due to toxic metabolic causes.  No definitive seizure or left form discharges were noted. Palliative care was also consulted along with oncology.  Given the concern for metastatic breast cancer, patient was recommended for transitioning to hospice care.

## 2023-06-06 NOTE — Progress Notes (Signed)
[employer], [employer] [facility]) Hospital Liaison Note   [facility] continuing to following for discharge disposition for hospice services at home.    [facility] Liaison [phone]

## 2023-06-06 NOTE — Progress Notes (Signed)
Progress Note    Andrea [name]   ZOX:[id number]  DOB: 1935/02/13  DOA: 05/30/2023     7 PCP: Andrea Bookman, NP  Initial CC: concern for seizure   Hospital Course: Ms. Beam is an 88 yo female with PMH atrial fibrillation, anemia, anxiety, depression, Alzheimer's dementia, type 2 diabetes, incontinence, constipation, cervicalgia, headaches, hypothyroidism, hypertension, lumbar radiculopathy, history of pneumonia, prediabetes, sleep apnea not on CPAP, cervical cancer, history of left mastectomy who was brought to the emergency department due to worsening dementia and concern for seizure. Her daughter witnessed patient having seizure activity and she was brought to the hospital. There was no reported post ictal period following shaking episodes.  CT head without contrast showing no acute intracranial normalities.  There is moderate cerebral and mild cerebellar atrophy with extensive chronic microvascular ischemic changes in the cerebral white matter. EEG showed moderate to severe diffuse encephalopathy of nonspecific etiology but consider due to toxic metabolic causes.  No definitive seizure or left form discharges were noted. Palliative care was also consulted along with oncology.  Given the concern for metastatic breast cancer, patient was recommended for transitioning to hospice care.  Interval History:  No events overnight.  Caretaker bedside this morning.  Patient resting comfortably when seen and had no concerns this morning.  Assessment and Plan:  Seizure - evaluated by neurology as well; possible etiology in setting of dementia, hx encephalitis, and/or other metabolic causes - MRI brain negative for metastatic disease - EEG with mod/severe diffuse encephalopathy  - keppra changed to depakote per neurology  - plan is for home with hospice   UTI - negative urine cx - course of rocephin completed empirically   Depression and anxiety-continue home meds   HTN - lasix on  hold   Hypothyroidism - continue synthroid   OSA - not on CPAP  PAF - on eliquis  DVT - eliquis   DM II - A1c 5.2% - Diet controlled  Hypokalemia - Repleted  Anemia of chronic disease - Hemoglobin stable  Goals of care discussion - Patient DNR and transitioned to hospice after GOC discussions with palliative care and oncology - Plan is for discharge home on Friday with hospice    Estimated body mass index is 35.32 kg/m as calculated from the following:   Height as of this encounter: 5\' 2"  (1.575 m).   Weight as of this encounter: 87.6 kg.    Old records reviewed in assessment of this patient   DVT prophylaxis:   apixaban (ELIQUIS) tablet 5 mg   Code Status:   Code Status: DNR  Mobility Assessment (Last 72 Hours)     Mobility Assessment     Row Name 06/06/23 1100 06/06/23 0827 06/06/23 0100 06/05/23 0850 06/04/23 2300   Does patient have an order for bedrest or is patient medically unstable -- No - Continue assessment No - Continue assessment No - Continue assessment No - Continue assessment   What is the highest level of mobility based on the progressive mobility assessment? Level 2 (Chairfast) - Balance while sitting on edge of bed and cannot stand Level 2 (Chairfast) - Balance while sitting on edge of bed and cannot stand Level 3 (Stands with assist) - Balance while standing  and cannot march in place Level 2 (Chairfast) - Balance while sitting on edge of bed and cannot stand Level 3 (Stands with assist) - Balance while standing  and cannot march in place   Is the above level different from baseline mobility  prior to current illness? -- Yes - Recommend PT order Yes - Recommend PT order Yes - Recommend PT order Yes - Recommend PT order    Row Name 06/04/23 1143 06/04/23 0952 06/03/23 2120       Does patient have an order for bedrest or is patient medically unstable -- No - Continue assessment No - Continue assessment     What is the highest level of mobility  based on the progressive mobility assessment? Level 3 (Stands with assist) - Balance while standing  and cannot march in place Level 2 (Chairfast) - Balance while sitting on edge of bed and cannot stand Level 3 (Stands with assist) - Balance while standing  and cannot march in place     Is the above level different from baseline mobility prior to current illness? -- Yes - Recommend PT order Yes - Recommend PT order              Barriers to discharge: none Disposition Plan:  Home with hospice Friday Status is: Inpt  Objective: Blood pressure 114/69, pulse 74, temperature 98.8 F (37.1 C), temperature source Oral, resp. rate (!) 22, height 5\' 2"  (1.575 m), weight 87.6 kg, SpO2 96%.  Examination:  Physical Exam Constitutional:      General: She is not in acute distress.    Appearance: She is not ill-appearing.  HENT:     Head: Normocephalic and atraumatic.     Mouth/Throat:     Mouth: Mucous membranes are moist.  Eyes:     Extraocular Movements: Extraocular movements intact.  Cardiovascular:     Rate and Rhythm: Normal rate and regular rhythm.  Pulmonary:     Effort: Pulmonary effort is normal.     Breath sounds: Normal breath sounds.  Abdominal:     General: Bowel sounds are normal. There is no distension.     Palpations: Abdomen is soft.     Tenderness: There is no abdominal tenderness.  Musculoskeletal:        General: No swelling. Normal range of motion.     Cervical back: Normal range of motion and neck supple.  Skin:    General: Skin is dry.  Neurological:     Mental Status: She is alert. Mental status is at baseline.     Comments: Follows commands and moves all 4 extremities      Consultants:  Oncology Palliative care  Procedures:    Data Reviewed: Results for orders placed or performed during the hospital encounter of 05/30/23 (from the past 24 hour(s))  Glucose, capillary     Status: Abnormal   Collection Time: 06/05/23  4:57 PM  Result Value Ref Range    Glucose-Capillary 102 (H) 70 - 99 mg/dL  Glucose, capillary     Status: Abnormal   Collection Time: 06/05/23  9:04 PM  Result Value Ref Range   Glucose-Capillary 120 (H) 70 - 99 mg/dL  Glucose, capillary     Status: None   Collection Time: 06/06/23  7:47 AM  Result Value Ref Range   Glucose-Capillary 90 70 - 99 mg/dL  Glucose, capillary     Status: Abnormal   Collection Time: 06/06/23 11:27 AM  Result Value Ref Range   Glucose-Capillary 116 (H) 70 - 99 mg/dL    I have reviewed pertinent nursing notes, vitals, labs, and images as necessary. I have ordered labwork to follow up on as indicated.  I have reviewed the last notes from staff over past 24 hours. I have discussed  patient's care plan and test results with nursing staff, CM/SW, and other staff as appropriate.  Time spent: Greater than 50% of the 55 minute visit was spent in counseling/coordination of care for the patient as laid out in the A&P.   LOS: 7 days   Lewie Chamber, MD [facility] 06/06/2023, 3:52 PM

## 2023-06-07 DIAGNOSIS — R569 Unspecified convulsions: Secondary | ICD-10-CM | POA: Diagnosis not present

## 2023-06-07 LAB — GLUCOSE, CAPILLARY
Glucose-Capillary: 109 mg/dL — ABNORMAL HIGH (ref 70–99)
Glucose-Capillary: 126 mg/dL — ABNORMAL HIGH (ref 70–99)
Glucose-Capillary: 101 mg/dL — ABNORMAL HIGH (ref 70–99)
Glucose-Capillary: 105 mg/dL — ABNORMAL HIGH (ref 70–99)

## 2023-06-07 MED ORDER — DIVALPROEX SODIUM 500 MG PO DR TAB: 500.0000 mg | DELAYED_RELEASE_TABLET | Freq: Two times a day (BID) | ORAL | 3 refills | Status: DC

## 2023-06-07 MED ORDER — DULOXETINE HCL 30 MG PO CPEP: 30.0000 mg | ORAL_CAPSULE | Freq: Every day | ORAL | 3 refills | Status: DC

## 2023-06-07 NOTE — Plan of Care (Signed)
  Problem: Education: Goal: Knowledge of General Education information will improve Description: Including pain rating scale, medication(s)/side effects and non-pharmacologic comfort measures Outcome: Not Progressing   Problem: Health Behavior/Discharge Planning: Goal: Ability to manage health-related needs will improve Outcome: Not Progressing   

## 2023-06-07 NOTE — Progress Notes (Addendum)
[employer] engineer, contracting [facility]) Hospital Liaison Note   [facility] continuing to following for discharge disposition for hospice services at home.    [facility] Liaison [phone]

## 2023-06-07 NOTE — Progress Notes (Signed)
Progress Note    Jerelyn Trimarco   UJW:[id number]  DOB: 1935-09-29  DOA: 05/30/2023     8 PCP: Caesar Bookman, NP  Initial CC: concern for seizure   Hospital Course: Ms. Wingler is an 88 yo female with PMH atrial fibrillation, anemia, anxiety, depression, Alzheimer's dementia, type 2 diabetes, incontinence, constipation, cervicalgia, headaches, hypothyroidism, hypertension, lumbar radiculopathy, history of pneumonia, prediabetes, sleep apnea not on CPAP, cervical cancer, history of left mastectomy who was brought to the emergency department due to worsening dementia and concern for seizure. Her daughter witnessed patient having seizure activity and she was brought to the hospital. There was no reported post ictal period following shaking episodes.  CT head without contrast showing no acute intracranial normalities.  There is moderate cerebral and mild cerebellar atrophy with extensive chronic microvascular ischemic changes in the cerebral white matter. EEG showed moderate to severe diffuse encephalopathy of nonspecific etiology but consider due to toxic metabolic causes.  No definitive seizure or left form discharges were noted. Palliative care was also consulted along with oncology.  Given the concern for metastatic breast cancer, patient was recommended for transitioning to hospice care.  Interval History:  No events overnight.  Caretaker bedside this morning.  Patient still comfortable and no complaints.  Called and spoke with Bobbi this afternoon as well. Plan remains for d/c tomorrow home with hospice.   Assessment and Plan:  Seizure - evaluated by neurology as well; possible etiology in setting of dementia, hx encephalitis, and/or other metabolic causes - MRI brain negative for metastatic disease - EEG with mod/severe diffuse encephalopathy  - keppra changed to depakote per neurology  - plan is for home with hospice on Friday   UTI - negative urine cx - course of rocephin  completed empirically   Depression and anxiety-continue home meds   HTN - lasix on hold   Hypothyroidism - continue synthroid   OSA - not on CPAP  PAF - on eliquis  DVT - eliquis   DM II - A1c 5.2% - Diet controlled  Hypokalemia - Repleted  Anemia of chronic disease - Hemoglobin stable  Goals of care discussion - Patient DNR and transitioned to hospice after GOC discussions with palliative care and oncology - Plan is for discharge home on Friday with hospice    Estimated body mass index is 35.32 kg/m as calculated from the following:   Height as of this encounter: 5\' 2"  (1.575 m).   Weight as of this encounter: 87.6 kg.    Old records reviewed in assessment of this patient   DVT prophylaxis:   apixaban (ELIQUIS) tablet 5 mg   Code Status:   Code Status: DNR  Mobility Assessment (Last 72 Hours)     Mobility Assessment     Row Name 06/06/23 2100 06/06/23 1100 06/06/23 0827 06/06/23 0100 06/05/23 0850   Does patient have an order for bedrest or is patient medically unstable No - Continue assessment -- No - Continue assessment No - Continue assessment No - Continue assessment   What is the highest level of mobility based on the progressive mobility assessment? Level 2 (Chairfast) - Balance while sitting on edge of bed and cannot stand Level 2 (Chairfast) - Balance while sitting on edge of bed and cannot stand Level 2 (Chairfast) - Balance while sitting on edge of bed and cannot stand Level 3 (Stands with assist) - Balance while standing  and cannot march in place Level 2 (Chairfast) - Balance while sitting on edge  of bed and cannot stand   Is the above level different from baseline mobility prior to current illness? Yes - Recommend PT order -- Yes - Recommend PT order Yes - Recommend PT order Yes - Recommend PT order    Row Name 06/04/23 2300           Does patient have an order for bedrest or is patient medically unstable No - Continue assessment        What is the highest level of mobility based on the progressive mobility assessment? Level 3 (Stands with assist) - Balance while standing  and cannot march in place       Is the above level different from baseline mobility prior to current illness? Yes - Recommend PT order               Mobility Assessment (Last 72 Hours)     Mobility Assessment     Row Name 06/06/23 2100 06/06/23 1100 06/06/23 0827 06/06/23 0100 06/05/23 0850   Does patient have an order for bedrest or is patient medically unstable No - Continue assessment -- No - Continue assessment No - Continue assessment No - Continue assessment   What is the highest level of mobility based on the progressive mobility assessment? Level 2 (Chairfast) - Balance while sitting on edge of bed and cannot stand Level 2 (Chairfast) - Balance while sitting on edge of bed and cannot stand Level 2 (Chairfast) - Balance while sitting on edge of bed and cannot stand Level 3 (Stands with assist) - Balance while standing  and cannot march in place Level 2 (Chairfast) - Balance while sitting on edge of bed and cannot stand   Is the above level different from baseline mobility prior to current illness? Yes - Recommend PT order -- Yes - Recommend PT order Yes - Recommend PT order Yes - Recommend PT order    Row Name 06/04/23 2300           Does patient have an order for bedrest or is patient medically unstable No - Continue assessment       What is the highest level of mobility based on the progressive mobility assessment? Level 3 (Stands with assist) - Balance while standing  and cannot march in place       Is the above level different from baseline mobility prior to current illness? Yes - Recommend PT order               Mobility Assessment (Last 72 Hours)     Mobility Assessment     Row Name 06/06/23 2100 06/06/23 1100 06/06/23 0827 06/06/23 0100 06/05/23 0850   Does patient have an order for bedrest or is patient medically unstable No -  Continue assessment -- No - Continue assessment No - Continue assessment No - Continue assessment   What is the highest level of mobility based on the progressive mobility assessment? Level 2 (Chairfast) - Balance while sitting on edge of bed and cannot stand Level 2 (Chairfast) - Balance while sitting on edge of bed and cannot stand Level 2 (Chairfast) - Balance while sitting on edge of bed and cannot stand Level 3 (Stands with assist) - Balance while standing  and cannot march in place Level 2 (Chairfast) - Balance while sitting on edge of bed and cannot stand   Is the above level different from baseline mobility prior to current illness? Yes - Recommend PT order -- Yes - Recommend PT order Yes -  Recommend PT order Yes - Recommend PT order    Row Name 06/04/23 2300           Does patient have an order for bedrest or is patient medically unstable No - Continue assessment       What is the highest level of mobility based on the progressive mobility assessment? Level 3 (Stands with assist) - Balance while standing  and cannot march in place       Is the above level different from baseline mobility prior to current illness? Yes - Recommend PT order                Barriers to discharge: none Disposition Plan:  Home with hospice Friday Status is: Inpt  Objective: Blood pressure (!) 154/55, pulse 66, temperature 98.4 F (36.9 C), temperature source Oral, resp. rate 17, height 5\' 2"  (1.575 m), weight 87.6 kg, SpO2 99%.  Examination:  Physical Exam Constitutional:      General: She is not in acute distress.    Appearance: She is not ill-appearing.  HENT:     Head: Normocephalic and atraumatic.     Mouth/Throat:     Mouth: Mucous membranes are moist.  Eyes:     Extraocular Movements: Extraocular movements intact.  Cardiovascular:     Rate and Rhythm: Normal rate and regular rhythm.  Pulmonary:     Effort: Pulmonary effort is normal.     Breath sounds: Normal breath sounds.   Abdominal:     General: Bowel sounds are normal. There is no distension.     Palpations: Abdomen is soft.     Tenderness: There is no abdominal tenderness.  Musculoskeletal:        General: No swelling. Normal range of motion.     Cervical back: Normal range of motion and neck supple.  Skin:    General: Skin is dry.  Neurological:     Mental Status: She is alert. Mental status is at baseline.     Comments: Follows commands and moves all 4 extremities      Consultants:  Oncology Palliative care  Procedures:    Data Reviewed: Results for orders placed or performed during the hospital encounter of 05/30/23 (from the past 24 hour(s))  Glucose, capillary     Status: Abnormal   Collection Time: 06/06/23  4:27 PM  Result Value Ref Range   Glucose-Capillary 112 (H) 70 - 99 mg/dL  Glucose, capillary     Status: Abnormal   Collection Time: 06/06/23  9:58 PM  Result Value Ref Range   Glucose-Capillary 108 (H) 70 - 99 mg/dL  Glucose, capillary     Status: Abnormal   Collection Time: 06/07/23  7:36 AM  Result Value Ref Range   Glucose-Capillary 109 (H) 70 - 99 mg/dL  Glucose, capillary     Status: Abnormal   Collection Time: 06/07/23 12:13 PM  Result Value Ref Range   Glucose-Capillary 126 (H) 70 - 99 mg/dL    I have reviewed pertinent nursing notes, vitals, labs, and images as necessary. I have ordered labwork to follow up on as indicated.  I have reviewed the last notes from staff over past 24 hours. I have discussed patient's care plan and test results with nursing staff, CM/SW, and other staff as appropriate.    LOS: 8 days   Lewie Chamber, MD [facility] 06/07/2023, 1:29 PM

## 2023-06-08 DIAGNOSIS — E039 Hypothyroidism, unspecified: Secondary | ICD-10-CM | POA: Diagnosis not present

## 2023-06-08 DIAGNOSIS — C50919 Malignant neoplasm of unspecified site of unspecified female breast: Secondary | ICD-10-CM | POA: Insufficient documentation

## 2023-06-08 DIAGNOSIS — R569 Unspecified convulsions: Secondary | ICD-10-CM | POA: Diagnosis not present

## 2023-06-08 LAB — GLUCOSE, CAPILLARY: Glucose-Capillary: 90 mg/dL (ref 70–99)

## 2023-06-08 NOTE — Discharge Summary (Addendum)
Physician Discharge Summary   Andrea [name] YQM:[id number] DOB: [birth year] DOA: 05/30/2023  PCP: Andrea Bookman, NP  Admit date: 05/30/2023 Discharge date: 06/08/2023   Admitted From: Home Disposition:  Home with hospice Discharging physician: Lewie Chamber, MD Barriers to discharge: none  Recommendations at discharge: Continue comfort care/hospice   Discharge Condition: stable CODE STATUS: DNR Diet recommendation:  Diet Orders (From admission, onward)     Start     Ordered   06/08/23 0000  Diet - low sodium heart healthy        06/08/23 1007   05/30/23 0955  Diet heart healthy/carb modified Room service appropriate? Yes; Fluid consistency: Thin  Diet effective now       Question Answer Comment  Diet-HS Snack? Nothing   Room service appropriate? Yes   Fluid consistency: Thin      05/30/23 0955            Hospital Course: [name] is an 88 yo female with PMH atrial fibrillation, anemia, anxiety, depression, Alzheimer's dementia, type 2 diabetes, incontinence, constipation, cervicalgia, headaches, hypothyroidism, hypertension, lumbar radiculopathy, history of pneumonia, prediabetes, sleep apnea not on CPAP, cervical cancer, history of left mastectomy who was brought to the emergency department due to worsening dementia and concern for seizure. Her daughter witnessed patient having seizure activity and she was brought to the hospital. There was no reported post ictal period following shaking episodes.  CT head without contrast showing no acute intracranial normalities.  There is moderate cerebral and mild cerebellar atrophy with extensive chronic microvascular ischemic changes in the cerebral white matter. EEG showed moderate to severe diffuse encephalopathy of nonspecific etiology but consider due to toxic metabolic causes.  No definitive seizure or left form discharges were noted. Palliative care was also consulted along with oncology.  Given the concern for metastatic  breast cancer, patient was recommended for transitioning to hospice care.  Assessment and Plan:  Seizure - evaluated by neurology as well; possible etiology in setting of dementia, hx encephalitis, and/or other metabolic causes - MRI brain negative for metastatic disease - EEG with mod/severe diffuse encephalopathy  - keppra changed to depakote per neurology   Metastatic breast cancer - CT noted with left breast mass, bilateral lung nodules, right hepatic lobe lesions consistent with metastatic breast cancer - Goals of care discussed and given poor prognosis, decisions were made for transitioning patient to hospice care at home upon discharge   UTI - negative urine cx - course of rocephin completed empirically    Depression and anxiety-continue home meds   HTN - lasix   Hypothyroidism - continue synthroid    OSA - not on CPAP   PAF - on eliquis   DVT - eliquis    DM II - A1c 5.2% - Diet controlled   Hypokalemia - Repleted   Anemia of chronic disease - Hemoglobin stable   Goals of care discussion - Patient DNR and transitioned to hospice after GOC discussions with palliative care and oncology - Plan is for discharge home on Friday with hospice  Principal Diagnosis: Seizures (HCC)  Discharge Diagnoses: Active Hospital Problems   Diagnosis Date Noted   Seizures (HCC) 11/25/2020   Stage IV breast cancer in female [facility]) 06/08/2023   Need for emotional support 06/05/2023   DNR (do not resuscitate) 06/05/2023   Counseling and coordination of care 06/05/2023   Goals of care, counseling/discussion 06/05/2023   Seizure (HCC) 06/05/2023   Palliative care encounter 06/05/2023   Type 2 diabetes mellitus  with hyperglycemia (HCC) 05/30/2023   Metabolic acidosis 05/30/2023   Normocytic anemia 07/03/2019   Chronic deep vein thrombosis (DVT) of proximal vein of both lower extremities (HCC) 10/24/2018   Hyperlipidemia 04/20/2016   Paroxysmal A-fib (HCC) 04/20/2016    OSA on CPAP 10/12/2014   Lower urinary tract infectious disease 01/18/2012   DEPRESSION/ANXIETY 03/02/2010   Acquired hypothyroidism 08/06/2009   Chronic pain associated with significant psychosocial dysfunction 08/06/2009   Essential (primary) hypertension 08/06/2009    Resolved Hospital Problems  No resolved problems to display.     Discharge Instructions     Diet - low sodium heart healthy   Complete by: As directed    Increase activity slowly   Complete by: As directed    No wound care   Complete by: As directed       Allergies as of 06/08/2023       Reactions   Clindamycin Hcl Other (See Comments)   REACTION: swelling, pt. Reports that she had a 16 lb. weightgain in one day        Medication List     STOP taking these medications    memantine 5 MG tablet Commonly known as: NAMENDA   mirtazapine 7.5 MG tablet Commonly known as: REMERON   oxybutynin 10 MG 24 hr tablet Commonly known as: DITROPAN-XL       TAKE these medications    brexpiprazole 2 MG Tabs tablet Commonly known as: REXULTI Take 2 mg by mouth at bedtime.   busPIRone 5 MG tablet Commonly known as: BUSPAR Take 5-10 mg by mouth 2 (two) times daily. Take 10 mg by mouth every morning and 5 mg every evening.   CALCIUM CITRATE PO Take 600 mg by mouth daily.   clonazePAM 0.5 MG tablet Commonly known as: KLONOPIN Take 0.25 mg by mouth as needed for anxiety.   D-Mannose 500 MG Caps Take by mouth daily.   divalproex 500 MG DR tablet Commonly known as: DEPAKOTE Take 1 tablet (500 mg total) by mouth 2 (two) times daily.   donepezil 10 MG tablet Commonly known as: ARICEPT Take 1 tablet (10 mg total) by mouth daily. Take 1  tablet at 10 mg daily   DULoxetine 30 MG capsule Commonly known as: CYMBALTA Take 1 capsule (30 mg total) by mouth daily. What changed: how much to take   Eliquis 5 MG Tabs tablet Generic drug: apixaban TAKE 1 TABLET BY MOUTH TWICE A DAY   FIBER PO Take 1 tablet  by mouth every evening.   furosemide 20 MG tablet Commonly known as: LASIX TAKE 1 TABLET BY MOUTH EVERY DAY   Klor-Con M20 20 MEQ tablet Generic drug: potassium chloride SA TAKE 1 TABLET BY MOUTH EVERY DAY   levothyroxine 50 MCG tablet Commonly known as: SYNTHROID TAKE 1 TABLET BY MOUTH DAILY BEFORE BREAKFAST.   MULTIVITAMIN PO Take 1 tablet by mouth daily.   omega-3 acid ethyl esters 1 g capsule Commonly known as: LOVAZA Take 1 g by mouth every evening.   PROBIOTIC DAILY PO Take 1 tablet by mouth daily. OTC   traMADol 50 MG tablet Commonly known as: ULTRAM TAKE 1 TABLET (50 MG TOTAL) BY MOUTH EVERY 6 (SIX) HOURS AS NEEDED FOR MODERATE PAIN   traZODone 100 MG tablet Commonly known as: DESYREL Take 100 mg by mouth at bedtime.   trimethoprim 100 MG tablet Commonly known as: TRIMPEX Take 100 mg by mouth at bedtime.   vitamin B-12 500 MCG tablet Commonly known as: CYANOCOBALAMIN  Take 500 mcg by mouth daily.   Vitamin D (Ergocalciferol) 1.25 MG (50000 UNIT) Caps capsule Commonly known as: DRISDOL Take 50,000 Units by mouth once a week. Monday's   VITAMIN K2 PO Take 1 tablet by mouth daily.        Allergies  Allergen Reactions   Clindamycin Hcl Other (See Comments)    REACTION: swelling, pt. Reports that she had a 16 lb. weightgain in one day    Consultations: Oncology  Procedures:   Discharge Exam: BP (!) 122/54 (BP Location: Right Arm)   Pulse 75   Temp 98.9 F (37.2 C) (Oral)   Resp 17   Ht 5\' 2"  (1.575 m)   Wt 87.6 kg   SpO2 95%   BMI 35.32 kg/m  Physical Exam Constitutional:      Comments: Chronically ill-appearing elderly woman lying in bed in no distress  HENT:     Head: Normocephalic and atraumatic.     Mouth/Throat:     Mouth: Mucous membranes are moist.  Eyes:     Extraocular Movements: Extraocular movements intact.  Cardiovascular:     Rate and Rhythm: Normal rate and regular rhythm.  Pulmonary:     Effort: Pulmonary effort is  normal. No respiratory distress.  Abdominal:     General: Bowel sounds are normal. There is no distension.     Palpations: Abdomen is soft.     Tenderness: There is no abdominal tenderness.  Musculoskeletal:        General: Normal range of motion.     Cervical back: Normal range of motion and neck supple.  Skin:    General: Skin is warm and dry.  Neurological:     Comments: Follows commands and moves all 4 extremities      The results of significant diagnostics from this hospitalization (including imaging, microbiology, ancillary and laboratory) are listed below for reference.   Microbiology: Recent Results (from the past 240 hour(s))  Urine Culture (for pregnant, neutropenic or urologic patients or patients with an indwelling urinary catheter)     Status: None   Collection Time: 05/31/23 11:07 AM   Specimen: Urine, Clean Catch  Result Value Ref Range Status   Specimen Description   Final    URINE, CLEAN CATCH Performed at [facility], [address], [place], [place] [address]    Special Requests   Final    NONE Performed at [facility], [address] [place], [place] [address]    Culture   Final    NO GROWTH Performed at [facility], [address] [place], [place] [address]    Report Status 06/01/2023 FINAL  Final     Labs: BNP (last 3 results) No results for input(s): "BNP" in the last 8760 hours. Basic Metabolic Panel: Recent Labs  Lab 06/03/23 0244  NA 140  K 3.7  CL 109  CO2 24  GLUCOSE 95  BUN 11  CREATININE 0.61  CALCIUM 8.3*   Liver Function Tests: No results for input(s): "AST", "ALT", "ALKPHOS", "BILITOT", "PROT", "ALBUMIN" in the last 168 hours. No results for input(s): "LIPASE", "AMYLASE" in the last 168 hours. No results for input(s): "AMMONIA" in the last 168 hours. CBC: Recent Labs  Lab 06/03/23 0244  WBC 6.9  HGB 9.2*  HCT 29.7*  MCV 82.7  PLT 235   Cardiac Enzymes: No results for  input(s): "CKTOTAL", "CKMB", "CKMBINDEX", "TROPONINI" in the last 168 hours. BNP: Invalid input(s): "POCBNP" CBG: Recent Labs  Lab 06/07/23 0736 06/07/23 1213 06/07/23 1655 06/07/23 2028 06/08/23 0759  GLUCAP 109* 126* 101* 105* 90   D-Dimer No results for input(s): "DDIMER" in the last 72 hours. Hgb A1c No results for input(s): "HGBA1C" in the last 72 hours. Lipid Profile No results for input(s): "CHOL", "HDL", "LDLCALC", "TRIG", "CHOLHDL", "LDLDIRECT" in the last 72 hours. Thyroid function studies No results for input(s): "TSH", "T4TOTAL", "T3FREE", "THYROIDAB" in the last 72 hours.  Invalid input(s): "FREET3" Anemia work up No results for input(s): "VITAMINB12", "FOLATE", "FERRITIN", "TIBC", "IRON", "RETICCTPCT" in the last 72 hours. Urinalysis    Component Value Date/Time   COLORURINE AMBER (A) 05/30/2023 0517   APPEARANCEUR CLOUDY (A) 05/30/2023 0517   APPEARANCEUR Cloudy (A) 09/22/2014 1015   LABSPEC 1.017 05/30/2023 0517   PHURINE 6.0 05/30/2023 0517   GLUCOSEU NEGATIVE 05/30/2023 0517   HGBUR NEGATIVE 05/30/2023 0517   BILIRUBINUR NEGATIVE 05/30/2023 0517   BILIRUBINUR negative 01/19/2022 1438   BILIRUBINUR Negative 09/22/2014 1015   KETONESUR 5 (A) 05/30/2023 0517   PROTEINUR 100 (A) 05/30/2023 0517   UROBILINOGEN negative (A) 01/19/2022 1438   UROBILINOGEN 0.2 03/11/2013 2003   NITRITE NEGATIVE 05/30/2023 0517   LEUKOCYTESUR LARGE (A) 05/30/2023 0517   Sepsis Labs Recent Labs  Lab 06/03/23 0244  WBC 6.9   Microbiology Recent Results (from the past 240 hour(s))  Urine Culture (for pregnant, neutropenic or urologic patients or patients with an indwelling urinary catheter)     Status: None   Collection Time: 05/31/23 11:07 AM   Specimen: Urine, Clean Catch  Result Value Ref Range Status   Specimen Description   Final    URINE, CLEAN CATCH Performed at [facility], [address], [place], [place] [address]    Special Requests    Final    NONE Performed at [facility], [address] [place], [place] [address]    Culture   Final    NO GROWTH Performed at [facility], [address], [place], [place] [address]    Report Status 06/01/2023 FINAL  Final    Procedures/Studies: CT CHEST ABDOMEN PELVIS W CONTRAST  Result Date: 06/04/2023 CLINICAL DATA:  Breast cancer.  * Tracking Code: BO * EXAM: CT CHEST, ABDOMEN, AND PELVIS WITH CONTRAST TECHNIQUE: Multidetector CT imaging of the chest, abdomen and pelvis was performed following the standard protocol during bolus administration of intravenous contrast. RADIATION DOSE REDUCTION: This exam was performed according to the departmental dose-optimization program which includes automated exposure control, adjustment of the mA and/or kV according to patient size and/or use of iterative reconstruction technique. CONTRAST:  OMNIPAQUE IOHEXOL 300 MG/ML  SOLN COMPARISON:  CT abdomen pelvis 07/31/2019. FINDINGS: CT CHEST FINDINGS Cardiovascular: Right IJ Port-A-Cath terminates in the right atrium. Atherosclerotic calcification of the aorta. Heart is at the upper limits of normal in size. No pericardial effusion. Mediastinum/Nodes: No pathologically enlarged mediastinal, hilar, internal mammary or axillary lymph nodes. Central left breast mass is seen slightly superior to the nipple areolar complex and measures 2.9 x 3.1 cm (2/46). Esophagus is grossly unremarkable. Lungs/Pleura: Image quality is degraded by respiratory motion. Hematogenously distributed pulmonary nodules measure up to 12 mm in the posterior left lower lobe (5/96). Those seen in the lower lung zones are new from 07/31/2019. Dependent pleural thickening bilaterally. Airway is unremarkable. Musculoskeletal: Degenerative changes in the spine. No worrisome lytic or sclerotic lesions. CT ABDOMEN PELVIS FINDINGS Hepatobiliary: 2 vague hypodense lesions in the right hepatic lobe measure up to  10 mm  (2/49) additional cysts in the liver. Liver and gallbladder are otherwise unremarkable. No biliary ductal dilatation. Pancreas: Negative. Spleen: Negative. Adrenals/Urinary Tract: Adrenal glands and right kidney are unremarkable. Cysts in the left kidney. No specific follow-up necessary. Small left renal stones. Ureters are decompressed. There may be slight ventral bladder wall thickening. Stomach/Bowel: Posterior gastric diverticulum. Duodenal diverticulum. Stomach, small bowel and colon are otherwise unremarkable. Appendix is not readily visualized. Vascular/Lymphatic: Atherosclerotic calcification of the aorta. No pathologically enlarged lymph nodes. Reproductive: Uterus is visualized.  No adnexal mass. Other: No free fluid. Small bilateral inguinal hernias contain fat. Ventral hernia repair. Mesenteries and peritoneum are unremarkable. Musculoskeletal: Osteopenia. Degenerative changes in the spine. No worrisome lytic or sclerotic lesions. IMPRESSION: 1. Left breast mass with bilateral lung nodules and right hepatic lobe lesions, consistent with metastatic breast cancer. 2. Small left renal stones. 3.  Aortic atherosclerosis (ICD10-I70.0). Electronically Signed   By: Leanna Battles M.D.   On: 06/04/2023 16:43   EEG adult  Result Date: 05/30/2023 Charlsie Quest, MD     05/30/2023  9:42 PM Patient Name: Andrea [name] MRN: [id number] Epilepsy Attending: Charlsie Quest Referring Physician/Provider: Caryl Pina, MD Date: 05/30/2023 Duration: 24.36 mins Patient history: 88 yo F presents after having a seizure at about 3:00 in the morning. EEG to evaluate for seizure. Level of alertness:  lethargic AEDs during EEG study: VPA Technical aspects: This EEG study was done with scalp electrodes positioned according to the 10-20 International system of electrode placement. Electrical activity was reviewed with band pass filter of 1-70Hz , sensitivity of 7 uV/mm, display speed of 68mm/sec with a 60Hz  notched filter  applied as appropriate. EEG data were recorded continuously and digitally stored.  Video monitoring was available and reviewed as appropriate. Description: EEG showed continuous generalized 3 to 6 Hz theta-delta slowing. Generalized periodic discharges with triphasic morphology at 0.5-1 Hz, more prominent when awake/stimulated were also noted. Hyperventilation and photic stimulation were not performed.   ABNORMALITY - Periodic discharges with triphasic morphology, generalized ( GPDs) - Continuous slow, generalized IMPRESSION: This study is suggestive of moderate to severe diffuse encephalopathy, nonspecific etiology but likely related to toxic-metabolic causes. No seizures or definite epileptiform discharges were seen throughout the recording. Charlsie Quest   MR BRAIN W WO CONTRAST  Result Date: 05/30/2023 CLINICAL DATA:  Seizure disorder, clinical change. EXAM: MRI HEAD WITHOUT AND WITH CONTRAST TECHNIQUE: Multiplanar, multiecho pulse sequences of the brain and surrounding structures were obtained without and with intravenous contrast. CONTRAST:  9mL GADAVIST GADOBUTROL 1 MMOL/ML IV SOLN COMPARISON:  MRI brain 11/11/2021.  Head CT 05/30/2023. FINDINGS: Brain: Motion degraded study. No acute infarct or hemorrhage. Background of severe chronic small-vessel disease, unchanged. No hydrocephalus or extra-axial collection. No foci of abnormal susceptibility. No mass or abnormal enhancement. Bilateral hippocampal atrophy. Otherwise normal hippocampal signal. No evidence of cortical dysgenesis. Vascular: Normal flow voids and vessel enhancement. Skull and upper cervical spine: Normal marrow signal and enhancement. Sinuses/Orbits: Unremarkable. Other: None. IMPRESSION: 1. No acute intracranial abnormality or mass. 2. Bilateral hippocampal atrophy. 3. Severe chronic small-vessel disease. Electronically Signed   By: Orvan Falconer M.D.   On: 05/30/2023 11:43   CT HEAD WO CONTRAST  Result Date: 05/30/2023 CLINICAL  DATA:  88 year old female with history of new onset of seizure. History of left-sided breast cancer. EXAM: CT HEAD WITHOUT CONTRAST TECHNIQUE: Contiguous axial images were obtained from the base of the skull through the vertex without intravenous contrast. RADIATION DOSE REDUCTION: This  exam was performed according to the departmental dose-optimization program which includes automated exposure control, adjustment of the mA and/or kV according to patient size and/or use of iterative reconstruction technique. COMPARISON:  Head CT 09/27/2022. FINDINGS: Brain: Moderate cerebral and mild cerebellar atrophy. Patchy and confluent areas of decreased attenuation are noted throughout the deep and periventricular white matter of the cerebral hemispheres bilaterally, compatible with chronic microvascular ischemic disease. No evidence of acute infarction, hemorrhage, hydrocephalus, extra-axial collection or mass lesion/mass effect. Vascular: No hyperdense vessel or unexpected calcification. Skull: Normal. Negative for fracture or focal lesion. Sinuses/Orbits: No acute finding. Other: None. IMPRESSION: 1. No acute intracranial abnormalities. 2. Moderate cerebral and mild cerebellar atrophy with extensive chronic microvascular ischemic changes in the cerebral white matter, as above, similar to prior studies. Electronically Signed   By: Trudie Reed M.D.   On: 05/30/2023 05:52   [place] LT BREAST BX W LOC DEV 1ST LESION IMG BX SPEC US GUIDE  Addendum Date: 05/17/2023   ADDENDUM REPORT: 05/17/2023 17:55 ADDENDUM: PATHOLOGY revealed: Site Breast, LEFT, needle core biopsy, retroareolar INVASIVE POORLY DIFFERENTIATED DUCTAL ADENOCARCINOMA, GRADE 3 (3+3+2) HIGH-GRADE DUCTAL CARCINOMA IN SITU, SOLID AND CRIBRIFORM TYPES WITH NECROSIS TUBULE FORMATION: SCORE 3 NUCLEAR PLEOMORPHISM: SCORE 3 MITOTIC COUNT: SCORE 2 TOTAL SCORE: 8 OVERALL GRADE: GRADE 3 (8/9) NEGATIVE FOR ANGIOLYMPHATIC INVASION MICROCALCIFICATION PRESENT WITHIN DCIS TUMOR  MEASURES 10 MM IN GREATEST LINEAR EXTENT STROMAL CHANGES COMPATIBLE WITH PRIOR THERAPY Pathology results are CONCORDANT with imaging findings, per Dr. Gerome Sam. Pathology results and recommendations below were discussed with patient's daughter by telephone on 05/17/2023. Patient reported biopsy site within normal limits with slight tenderness at the site to [facility], [place]. Post biopsy care instructions were reviewed, questions were answered and my direct phone number was provided to patient. Patient was instructed to call [facility] if any concerns or questions arise related to the biopsy. RECOMMENDATION: Surgical consultation has been arranged for patient to see Dr. Magnus Ivan at [facility] on 05/28/2023. Patient has history of LEFT breast G3 IDC April 2020 and was treated by Dr Magnus Ivan and Dr Pamelia Hoit. Patient's daughter is okay seeing Dr Magnus Ivan again but prefers to see first available so her mother can get treated. Pathology results reported by Lynett Grimes, RN on 05/17/2023. Electronically Signed   By: Gerome Sam III M.D.   On: 05/17/2023 17:55   Result Date: 05/17/2023 CLINICAL DATA:  Biopsy of a left retroareolar mass. EXAM: ULTRASOUND GUIDED LEFT BREAST CORE NEEDLE BIOPSY COMPARISON:  Previous exam(s). PROCEDURE: I met with the patient and we discussed the procedure of ultrasound-guided biopsy, including benefits and alternatives. We discussed the high likelihood of a successful procedure. We discussed the risks of the procedure, including infection, bleeding, tissue injury, clip migration, and inadequate sampling. Informed written consent was given. The usual time-out protocol was performed immediately prior to the procedure. Lesion quadrant: Left retroareolar Using sterile technique and 1% Lidocaine as local anesthetic, under direct ultrasound visualization, a 14 gauge spring-loaded device was used to perform biopsy of a left retroareolar mass using a medial  approach. At the conclusion of the procedure a HydroMARK shaped tissue marker clip was deployed into the biopsy cavity. Follow up 2 view mammogram was performed and dictated separately. IMPRESSION: Ultrasound guided biopsy of a left breast mass. No apparent complications. Electronically Signed: By: Gerome Sam III M.D. On: 05/16/2023 08:20  [place] LIMITED ULTRASOUND INCLUDING AXILLA LEFT BREAST   Result Date: 05/10/2023 CLINICAL DATA:  88 year old female with  history of left breast cancer post lumpectomy May 2020. Patient presents with left breast pain/palpable area of concern in the region of the left lumpectomy site. The patient was unable follow up after lumpectomy due to multiple medical issues, including a femur fracture. The patient does have severe dementia and is wheelchair-bound. EXAM: ULTRASOUND OF THE LEFT BREAST COMPARISON:  Previous exams. FINDINGS: Physical examination and subsequent ultrasound were both performed with the patient sitting in the wheelchair. Physical examination of the left breast reveals a firm mass in the upper left breast subjacent to the lumpectomy scar. Targeted ultrasound was performed demonstrating an irregular hypoechoic mass in the retroareolar left breast measuring 3.6 x 2.3 x 3.8 cm. Foci of high density within this mass likely represent calcification. No definite lymphadenopathy seen in the left axilla. IMPRESSION: Suspicious palpable 3.8 cm mass in the left breast in the region of prior lumpectomy site. RECOMMENDATION: Recommend ultrasound-guided core biopsy of the palpable mass in the left breast. I have discussed the findings and recommendations with the patient. If applicable, a reminder letter will be sent to the patient regarding the next appointment. BI-RADS CATEGORY  4: Suspicious. Electronically Signed   By: Edwin Cap M.D.   On: 05/10/2023 12:12    Time coordinating discharge: Over 30 minutes    Lewie Chamber, MD  [facility] 06/08/2023,  4:33 PM

## 2023-06-08 NOTE — Plan of Care (Signed)
  Problem: Education: Goal: Ability to describe self-care measures that may prevent or decrease complications (Diabetes Survival Skills Education) will improve Outcome: Adequate for Discharge Goal: Individualized Educational Video(s) Outcome: Adequate for Discharge   Problem: Coping: Goal: Ability to adjust to condition or change in health will improve Outcome: Adequate for Discharge   Problem: Fluid Volume: Goal: Ability to maintain a balanced intake and output will improve Outcome: Adequate for Discharge   Problem: Health Behavior/Discharge Planning: Goal: Ability to identify and utilize available resources and services will improve Outcome: Adequate for Discharge Goal: Ability to manage health-related needs will improve Outcome: Adequate for Discharge   Problem: Metabolic: Goal: Ability to maintain appropriate glucose levels will improve Outcome: Adequate for Discharge   Problem: Nutritional: Goal: Maintenance of adequate nutrition will improve Outcome: Adequate for Discharge Goal: Progress toward achieving an optimal weight will improve Outcome: Adequate for Discharge   Problem: Skin Integrity: Goal: Risk for impaired skin integrity will decrease Outcome: Adequate for Discharge   Problem: Tissue Perfusion: Goal: Adequacy of tissue perfusion will improve Outcome: Adequate for Discharge   Problem: Education: Goal: Knowledge of General Education information will improve Description: Including pain rating scale, medication(s)/side effects and non-pharmacologic comfort measures Outcome: Adequate for Discharge   Problem: Health Behavior/Discharge Planning: Goal: Ability to manage health-related needs will improve Outcome: Adequate for Discharge   Problem: Clinical Measurements: Goal: Ability to maintain clinical measurements within normal limits will improve Outcome: Adequate for Discharge Goal: Will remain free from infection Outcome: Adequate for Discharge Goal:  Diagnostic test results will improve Outcome: Adequate for Discharge Goal: Respiratory complications will improve Outcome: Adequate for Discharge Goal: Cardiovascular complication will be avoided Outcome: Adequate for Discharge   Problem: Nutrition: Goal: Adequate nutrition will be maintained Outcome: Adequate for Discharge   Problem: Activity: Goal: Risk for activity intolerance will decrease Outcome: Adequate for Discharge   Problem: Coping: Goal: Level of anxiety will decrease Outcome: Adequate for Discharge   Problem: Elimination: Goal: Will not experience complications related to bowel motility Outcome: Adequate for Discharge Goal: Will not experience complications related to urinary retention Outcome: Adequate for Discharge   Problem: Pain Managment: Goal: General experience of comfort will improve Outcome: Adequate for Discharge   Problem: Safety: Goal: Ability to remain free from injury will improve Outcome: Adequate for Discharge   Problem: Skin Integrity: Goal: Risk for impaired skin integrity will decrease Outcome: Adequate for Discharge

## 2023-06-09 DIAGNOSIS — C7800 Secondary malignant neoplasm of unspecified lung: Secondary | ICD-10-CM | POA: Diagnosis not present

## 2023-06-09 DIAGNOSIS — C50919 Malignant neoplasm of unspecified site of unspecified female breast: Secondary | ICD-10-CM | POA: Diagnosis not present

## 2023-06-09 DIAGNOSIS — I10 Essential (primary) hypertension: Secondary | ICD-10-CM | POA: Diagnosis not present

## 2023-06-09 DIAGNOSIS — G40909 Epilepsy, unspecified, not intractable, without status epilepticus: Secondary | ICD-10-CM | POA: Diagnosis not present

## 2023-06-09 DIAGNOSIS — E039 Hypothyroidism, unspecified: Secondary | ICD-10-CM | POA: Diagnosis not present

## 2023-06-09 DIAGNOSIS — I4891 Unspecified atrial fibrillation: Secondary | ICD-10-CM | POA: Diagnosis not present

## 2023-06-09 DIAGNOSIS — E119 Type 2 diabetes mellitus without complications: Secondary | ICD-10-CM | POA: Diagnosis not present

## 2023-06-09 DIAGNOSIS — E785 Hyperlipidemia, unspecified: Secondary | ICD-10-CM | POA: Diagnosis not present

## 2023-06-09 DIAGNOSIS — C787 Secondary malignant neoplasm of liver and intrahepatic bile duct: Secondary | ICD-10-CM | POA: Diagnosis not present

## 2023-06-09 DIAGNOSIS — F0153 Vascular dementia, unspecified severity, with mood disturbance: Secondary | ICD-10-CM | POA: Diagnosis not present

## 2023-06-09 DIAGNOSIS — N390 Urinary tract infection, site not specified: Secondary | ICD-10-CM | POA: Diagnosis not present

## 2023-06-10 DIAGNOSIS — G40909 Epilepsy, unspecified, not intractable, without status epilepticus: Secondary | ICD-10-CM | POA: Diagnosis not present

## 2023-06-10 DIAGNOSIS — C787 Secondary malignant neoplasm of liver and intrahepatic bile duct: Secondary | ICD-10-CM | POA: Diagnosis not present

## 2023-06-10 DIAGNOSIS — I10 Essential (primary) hypertension: Secondary | ICD-10-CM | POA: Diagnosis not present

## 2023-06-10 DIAGNOSIS — C50919 Malignant neoplasm of unspecified site of unspecified female breast: Secondary | ICD-10-CM | POA: Diagnosis not present

## 2023-06-10 DIAGNOSIS — C7800 Secondary malignant neoplasm of unspecified lung: Secondary | ICD-10-CM | POA: Diagnosis not present

## 2023-06-10 DIAGNOSIS — I4891 Unspecified atrial fibrillation: Secondary | ICD-10-CM | POA: Diagnosis not present

## 2023-06-11 DIAGNOSIS — C7800 Secondary malignant neoplasm of unspecified lung: Secondary | ICD-10-CM | POA: Diagnosis not present

## 2023-06-11 DIAGNOSIS — C787 Secondary malignant neoplasm of liver and intrahepatic bile duct: Secondary | ICD-10-CM | POA: Diagnosis not present

## 2023-06-11 DIAGNOSIS — I10 Essential (primary) hypertension: Secondary | ICD-10-CM | POA: Diagnosis not present

## 2023-06-11 DIAGNOSIS — G40909 Epilepsy, unspecified, not intractable, without status epilepticus: Secondary | ICD-10-CM | POA: Diagnosis not present

## 2023-06-11 DIAGNOSIS — C50919 Malignant neoplasm of unspecified site of unspecified female breast: Secondary | ICD-10-CM | POA: Diagnosis not present

## 2023-06-11 DIAGNOSIS — I4891 Unspecified atrial fibrillation: Secondary | ICD-10-CM | POA: Diagnosis not present

## 2023-06-12 DIAGNOSIS — I10 Essential (primary) hypertension: Secondary | ICD-10-CM | POA: Diagnosis not present

## 2023-06-12 DIAGNOSIS — C7800 Secondary malignant neoplasm of unspecified lung: Secondary | ICD-10-CM | POA: Diagnosis not present

## 2023-06-12 DIAGNOSIS — G40909 Epilepsy, unspecified, not intractable, without status epilepticus: Secondary | ICD-10-CM | POA: Diagnosis not present

## 2023-06-12 DIAGNOSIS — C50919 Malignant neoplasm of unspecified site of unspecified female breast: Secondary | ICD-10-CM | POA: Diagnosis not present

## 2023-06-12 DIAGNOSIS — I4891 Unspecified atrial fibrillation: Secondary | ICD-10-CM | POA: Diagnosis not present

## 2023-06-12 DIAGNOSIS — C787 Secondary malignant neoplasm of liver and intrahepatic bile duct: Secondary | ICD-10-CM | POA: Diagnosis not present

## 2023-06-13 DIAGNOSIS — G40909 Epilepsy, unspecified, not intractable, without status epilepticus: Secondary | ICD-10-CM | POA: Diagnosis not present

## 2023-06-13 DIAGNOSIS — I4891 Unspecified atrial fibrillation: Secondary | ICD-10-CM | POA: Diagnosis not present

## 2023-06-13 DIAGNOSIS — C50919 Malignant neoplasm of unspecified site of unspecified female breast: Secondary | ICD-10-CM | POA: Diagnosis not present

## 2023-06-13 DIAGNOSIS — I10 Essential (primary) hypertension: Secondary | ICD-10-CM | POA: Diagnosis not present

## 2023-06-13 DIAGNOSIS — C787 Secondary malignant neoplasm of liver and intrahepatic bile duct: Secondary | ICD-10-CM | POA: Diagnosis not present

## 2023-06-13 DIAGNOSIS — C7800 Secondary malignant neoplasm of unspecified lung: Secondary | ICD-10-CM | POA: Diagnosis not present

## 2023-06-14 DIAGNOSIS — C7800 Secondary malignant neoplasm of unspecified lung: Secondary | ICD-10-CM | POA: Diagnosis not present

## 2023-06-14 DIAGNOSIS — I4891 Unspecified atrial fibrillation: Secondary | ICD-10-CM | POA: Diagnosis not present

## 2023-06-14 DIAGNOSIS — G40909 Epilepsy, unspecified, not intractable, without status epilepticus: Secondary | ICD-10-CM | POA: Diagnosis not present

## 2023-06-14 DIAGNOSIS — C787 Secondary malignant neoplasm of liver and intrahepatic bile duct: Secondary | ICD-10-CM | POA: Diagnosis not present

## 2023-06-14 DIAGNOSIS — I10 Essential (primary) hypertension: Secondary | ICD-10-CM | POA: Diagnosis not present

## 2023-06-14 DIAGNOSIS — C50919 Malignant neoplasm of unspecified site of unspecified female breast: Secondary | ICD-10-CM | POA: Diagnosis not present

## 2023-06-15 DIAGNOSIS — C50919 Malignant neoplasm of unspecified site of unspecified female breast: Secondary | ICD-10-CM | POA: Diagnosis not present

## 2023-06-15 DIAGNOSIS — C787 Secondary malignant neoplasm of liver and intrahepatic bile duct: Secondary | ICD-10-CM | POA: Diagnosis not present

## 2023-06-15 DIAGNOSIS — C7800 Secondary malignant neoplasm of unspecified lung: Secondary | ICD-10-CM | POA: Diagnosis not present

## 2023-06-15 DIAGNOSIS — I10 Essential (primary) hypertension: Secondary | ICD-10-CM | POA: Diagnosis not present

## 2023-06-15 DIAGNOSIS — G40909 Epilepsy, unspecified, not intractable, without status epilepticus: Secondary | ICD-10-CM | POA: Diagnosis not present

## 2023-06-15 DIAGNOSIS — I4891 Unspecified atrial fibrillation: Secondary | ICD-10-CM | POA: Diagnosis not present

## 2023-06-16 DIAGNOSIS — C7800 Secondary malignant neoplasm of unspecified lung: Secondary | ICD-10-CM | POA: Diagnosis not present

## 2023-06-16 DIAGNOSIS — G40909 Epilepsy, unspecified, not intractable, without status epilepticus: Secondary | ICD-10-CM | POA: Diagnosis not present

## 2023-06-16 DIAGNOSIS — C787 Secondary malignant neoplasm of liver and intrahepatic bile duct: Secondary | ICD-10-CM | POA: Diagnosis not present

## 2023-06-16 DIAGNOSIS — I10 Essential (primary) hypertension: Secondary | ICD-10-CM | POA: Diagnosis not present

## 2023-06-16 DIAGNOSIS — I4891 Unspecified atrial fibrillation: Secondary | ICD-10-CM | POA: Diagnosis not present

## 2023-06-16 DIAGNOSIS — C50919 Malignant neoplasm of unspecified site of unspecified female breast: Secondary | ICD-10-CM | POA: Diagnosis not present

## 2023-06-17 DIAGNOSIS — I4891 Unspecified atrial fibrillation: Secondary | ICD-10-CM | POA: Diagnosis not present

## 2023-06-17 DIAGNOSIS — C7800 Secondary malignant neoplasm of unspecified lung: Secondary | ICD-10-CM | POA: Diagnosis not present

## 2023-06-17 DIAGNOSIS — C50919 Malignant neoplasm of unspecified site of unspecified female breast: Secondary | ICD-10-CM | POA: Diagnosis not present

## 2023-06-17 DIAGNOSIS — G40909 Epilepsy, unspecified, not intractable, without status epilepticus: Secondary | ICD-10-CM | POA: Diagnosis not present

## 2023-06-17 DIAGNOSIS — I10 Essential (primary) hypertension: Secondary | ICD-10-CM | POA: Diagnosis not present

## 2023-06-17 DIAGNOSIS — C787 Secondary malignant neoplasm of liver and intrahepatic bile duct: Secondary | ICD-10-CM | POA: Diagnosis not present

## 2023-06-18 DIAGNOSIS — I4891 Unspecified atrial fibrillation: Secondary | ICD-10-CM | POA: Diagnosis not present

## 2023-06-18 DIAGNOSIS — C787 Secondary malignant neoplasm of liver and intrahepatic bile duct: Secondary | ICD-10-CM | POA: Diagnosis not present

## 2023-06-18 DIAGNOSIS — G40909 Epilepsy, unspecified, not intractable, without status epilepticus: Secondary | ICD-10-CM | POA: Diagnosis not present

## 2023-06-18 DIAGNOSIS — C50919 Malignant neoplasm of unspecified site of unspecified female breast: Secondary | ICD-10-CM | POA: Diagnosis not present

## 2023-06-18 DIAGNOSIS — C7800 Secondary malignant neoplasm of unspecified lung: Secondary | ICD-10-CM | POA: Diagnosis not present

## 2023-06-18 DIAGNOSIS — I10 Essential (primary) hypertension: Secondary | ICD-10-CM | POA: Diagnosis not present

## 2023-06-19 DIAGNOSIS — I4891 Unspecified atrial fibrillation: Secondary | ICD-10-CM | POA: Diagnosis not present

## 2023-06-19 DIAGNOSIS — C787 Secondary malignant neoplasm of liver and intrahepatic bile duct: Secondary | ICD-10-CM | POA: Diagnosis not present

## 2023-06-19 DIAGNOSIS — I10 Essential (primary) hypertension: Secondary | ICD-10-CM | POA: Diagnosis not present

## 2023-06-19 DIAGNOSIS — C50919 Malignant neoplasm of unspecified site of unspecified female breast: Secondary | ICD-10-CM | POA: Diagnosis not present

## 2023-06-19 DIAGNOSIS — G40909 Epilepsy, unspecified, not intractable, without status epilepticus: Secondary | ICD-10-CM | POA: Diagnosis not present

## 2023-06-19 DIAGNOSIS — C7800 Secondary malignant neoplasm of unspecified lung: Secondary | ICD-10-CM | POA: Diagnosis not present

## 2023-06-20 DIAGNOSIS — C7800 Secondary malignant neoplasm of unspecified lung: Secondary | ICD-10-CM | POA: Diagnosis not present

## 2023-06-20 DIAGNOSIS — C787 Secondary malignant neoplasm of liver and intrahepatic bile duct: Secondary | ICD-10-CM | POA: Diagnosis not present

## 2023-06-20 DIAGNOSIS — I4891 Unspecified atrial fibrillation: Secondary | ICD-10-CM | POA: Diagnosis not present

## 2023-06-20 DIAGNOSIS — C50919 Malignant neoplasm of unspecified site of unspecified female breast: Secondary | ICD-10-CM | POA: Diagnosis not present

## 2023-06-20 DIAGNOSIS — I10 Essential (primary) hypertension: Secondary | ICD-10-CM | POA: Diagnosis not present

## 2023-06-20 DIAGNOSIS — G40909 Epilepsy, unspecified, not intractable, without status epilepticus: Secondary | ICD-10-CM | POA: Diagnosis not present

## 2023-06-21 DIAGNOSIS — G40909 Epilepsy, unspecified, not intractable, without status epilepticus: Secondary | ICD-10-CM | POA: Diagnosis not present

## 2023-06-21 DIAGNOSIS — C787 Secondary malignant neoplasm of liver and intrahepatic bile duct: Secondary | ICD-10-CM | POA: Diagnosis not present

## 2023-06-21 DIAGNOSIS — C7800 Secondary malignant neoplasm of unspecified lung: Secondary | ICD-10-CM | POA: Diagnosis not present

## 2023-06-21 DIAGNOSIS — E785 Hyperlipidemia, unspecified: Secondary | ICD-10-CM | POA: Diagnosis not present

## 2023-06-21 DIAGNOSIS — E119 Type 2 diabetes mellitus without complications: Secondary | ICD-10-CM | POA: Diagnosis not present

## 2023-06-21 DIAGNOSIS — F0153 Vascular dementia, unspecified severity, with mood disturbance: Secondary | ICD-10-CM | POA: Diagnosis not present

## 2023-06-21 DIAGNOSIS — N390 Urinary tract infection, site not specified: Secondary | ICD-10-CM | POA: Diagnosis not present

## 2023-06-21 DIAGNOSIS — C50919 Malignant neoplasm of unspecified site of unspecified female breast: Secondary | ICD-10-CM | POA: Diagnosis not present

## 2023-06-21 DIAGNOSIS — E039 Hypothyroidism, unspecified: Secondary | ICD-10-CM | POA: Diagnosis not present

## 2023-06-21 DIAGNOSIS — I10 Essential (primary) hypertension: Secondary | ICD-10-CM | POA: Diagnosis not present

## 2023-06-21 DIAGNOSIS — I4891 Unspecified atrial fibrillation: Secondary | ICD-10-CM | POA: Diagnosis not present

## 2023-06-22 DIAGNOSIS — I10 Essential (primary) hypertension: Secondary | ICD-10-CM | POA: Diagnosis not present

## 2023-06-22 DIAGNOSIS — C50919 Malignant neoplasm of unspecified site of unspecified female breast: Secondary | ICD-10-CM | POA: Diagnosis not present

## 2023-06-22 DIAGNOSIS — I4891 Unspecified atrial fibrillation: Secondary | ICD-10-CM | POA: Diagnosis not present

## 2023-06-22 DIAGNOSIS — C7800 Secondary malignant neoplasm of unspecified lung: Secondary | ICD-10-CM | POA: Diagnosis not present

## 2023-06-22 DIAGNOSIS — C787 Secondary malignant neoplasm of liver and intrahepatic bile duct: Secondary | ICD-10-CM | POA: Diagnosis not present

## 2023-06-22 DIAGNOSIS — G40909 Epilepsy, unspecified, not intractable, without status epilepticus: Secondary | ICD-10-CM | POA: Diagnosis not present

## 2023-06-23 DIAGNOSIS — I10 Essential (primary) hypertension: Secondary | ICD-10-CM | POA: Diagnosis not present

## 2023-06-23 DIAGNOSIS — I4891 Unspecified atrial fibrillation: Secondary | ICD-10-CM | POA: Diagnosis not present

## 2023-06-23 DIAGNOSIS — C7800 Secondary malignant neoplasm of unspecified lung: Secondary | ICD-10-CM | POA: Diagnosis not present

## 2023-06-23 DIAGNOSIS — C787 Secondary malignant neoplasm of liver and intrahepatic bile duct: Secondary | ICD-10-CM | POA: Diagnosis not present

## 2023-06-23 DIAGNOSIS — G40909 Epilepsy, unspecified, not intractable, without status epilepticus: Secondary | ICD-10-CM | POA: Diagnosis not present

## 2023-06-23 DIAGNOSIS — C50919 Malignant neoplasm of unspecified site of unspecified female breast: Secondary | ICD-10-CM | POA: Diagnosis not present

## 2023-06-24 DIAGNOSIS — G40909 Epilepsy, unspecified, not intractable, without status epilepticus: Secondary | ICD-10-CM | POA: Diagnosis not present

## 2023-06-24 DIAGNOSIS — C787 Secondary malignant neoplasm of liver and intrahepatic bile duct: Secondary | ICD-10-CM | POA: Diagnosis not present

## 2023-06-24 DIAGNOSIS — C7800 Secondary malignant neoplasm of unspecified lung: Secondary | ICD-10-CM | POA: Diagnosis not present

## 2023-06-24 DIAGNOSIS — I4891 Unspecified atrial fibrillation: Secondary | ICD-10-CM | POA: Diagnosis not present

## 2023-06-24 DIAGNOSIS — C50919 Malignant neoplasm of unspecified site of unspecified female breast: Secondary | ICD-10-CM | POA: Diagnosis not present

## 2023-06-24 DIAGNOSIS — I10 Essential (primary) hypertension: Secondary | ICD-10-CM | POA: Diagnosis not present

## 2023-06-25 DIAGNOSIS — I4891 Unspecified atrial fibrillation: Secondary | ICD-10-CM | POA: Diagnosis not present

## 2023-06-25 DIAGNOSIS — C787 Secondary malignant neoplasm of liver and intrahepatic bile duct: Secondary | ICD-10-CM | POA: Diagnosis not present

## 2023-06-25 DIAGNOSIS — I10 Essential (primary) hypertension: Secondary | ICD-10-CM | POA: Diagnosis not present

## 2023-06-25 DIAGNOSIS — G40909 Epilepsy, unspecified, not intractable, without status epilepticus: Secondary | ICD-10-CM | POA: Diagnosis not present

## 2023-06-25 DIAGNOSIS — C7800 Secondary malignant neoplasm of unspecified lung: Secondary | ICD-10-CM | POA: Diagnosis not present

## 2023-06-25 DIAGNOSIS — C50919 Malignant neoplasm of unspecified site of unspecified female breast: Secondary | ICD-10-CM | POA: Diagnosis not present

## 2023-06-26 DIAGNOSIS — C787 Secondary malignant neoplasm of liver and intrahepatic bile duct: Secondary | ICD-10-CM | POA: Diagnosis not present

## 2023-06-26 DIAGNOSIS — C50919 Malignant neoplasm of unspecified site of unspecified female breast: Secondary | ICD-10-CM | POA: Diagnosis not present

## 2023-06-26 DIAGNOSIS — G40909 Epilepsy, unspecified, not intractable, without status epilepticus: Secondary | ICD-10-CM | POA: Diagnosis not present

## 2023-06-26 DIAGNOSIS — I4891 Unspecified atrial fibrillation: Secondary | ICD-10-CM | POA: Diagnosis not present

## 2023-06-26 DIAGNOSIS — C7800 Secondary malignant neoplasm of unspecified lung: Secondary | ICD-10-CM | POA: Diagnosis not present

## 2023-06-26 DIAGNOSIS — I10 Essential (primary) hypertension: Secondary | ICD-10-CM | POA: Diagnosis not present

## 2023-06-27 DIAGNOSIS — G40909 Epilepsy, unspecified, not intractable, without status epilepticus: Secondary | ICD-10-CM | POA: Diagnosis not present

## 2023-06-27 DIAGNOSIS — C7800 Secondary malignant neoplasm of unspecified lung: Secondary | ICD-10-CM | POA: Diagnosis not present

## 2023-06-27 DIAGNOSIS — I4891 Unspecified atrial fibrillation: Secondary | ICD-10-CM | POA: Diagnosis not present

## 2023-06-27 DIAGNOSIS — C787 Secondary malignant neoplasm of liver and intrahepatic bile duct: Secondary | ICD-10-CM | POA: Diagnosis not present

## 2023-06-27 DIAGNOSIS — C50919 Malignant neoplasm of unspecified site of unspecified female breast: Secondary | ICD-10-CM | POA: Diagnosis not present

## 2023-06-27 DIAGNOSIS — I10 Essential (primary) hypertension: Secondary | ICD-10-CM | POA: Diagnosis not present

## 2023-06-28 DIAGNOSIS — I4891 Unspecified atrial fibrillation: Secondary | ICD-10-CM | POA: Diagnosis not present

## 2023-06-28 DIAGNOSIS — C50919 Malignant neoplasm of unspecified site of unspecified female breast: Secondary | ICD-10-CM | POA: Diagnosis not present

## 2023-06-28 DIAGNOSIS — I10 Essential (primary) hypertension: Secondary | ICD-10-CM | POA: Diagnosis not present

## 2023-06-28 DIAGNOSIS — C7800 Secondary malignant neoplasm of unspecified lung: Secondary | ICD-10-CM | POA: Diagnosis not present

## 2023-06-28 DIAGNOSIS — C787 Secondary malignant neoplasm of liver and intrahepatic bile duct: Secondary | ICD-10-CM | POA: Diagnosis not present

## 2023-06-28 DIAGNOSIS — G40909 Epilepsy, unspecified, not intractable, without status epilepticus: Secondary | ICD-10-CM | POA: Diagnosis not present

## 2023-06-29 DIAGNOSIS — I4891 Unspecified atrial fibrillation: Secondary | ICD-10-CM | POA: Diagnosis not present

## 2023-06-29 DIAGNOSIS — I10 Essential (primary) hypertension: Secondary | ICD-10-CM | POA: Diagnosis not present

## 2023-06-29 DIAGNOSIS — C7800 Secondary malignant neoplasm of unspecified lung: Secondary | ICD-10-CM | POA: Diagnosis not present

## 2023-06-29 DIAGNOSIS — G40909 Epilepsy, unspecified, not intractable, without status epilepticus: Secondary | ICD-10-CM | POA: Diagnosis not present

## 2023-06-29 DIAGNOSIS — C787 Secondary malignant neoplasm of liver and intrahepatic bile duct: Secondary | ICD-10-CM | POA: Diagnosis not present

## 2023-06-29 DIAGNOSIS — C50919 Malignant neoplasm of unspecified site of unspecified female breast: Secondary | ICD-10-CM | POA: Diagnosis not present

## 2023-06-30 DIAGNOSIS — C7800 Secondary malignant neoplasm of unspecified lung: Secondary | ICD-10-CM | POA: Diagnosis not present

## 2023-06-30 DIAGNOSIS — I10 Essential (primary) hypertension: Secondary | ICD-10-CM | POA: Diagnosis not present

## 2023-06-30 DIAGNOSIS — I4891 Unspecified atrial fibrillation: Secondary | ICD-10-CM | POA: Diagnosis not present

## 2023-06-30 DIAGNOSIS — C787 Secondary malignant neoplasm of liver and intrahepatic bile duct: Secondary | ICD-10-CM | POA: Diagnosis not present

## 2023-06-30 DIAGNOSIS — C50919 Malignant neoplasm of unspecified site of unspecified female breast: Secondary | ICD-10-CM | POA: Diagnosis not present

## 2023-06-30 DIAGNOSIS — G40909 Epilepsy, unspecified, not intractable, without status epilepticus: Secondary | ICD-10-CM | POA: Diagnosis not present

## 2023-07-01 DIAGNOSIS — I10 Essential (primary) hypertension: Secondary | ICD-10-CM | POA: Diagnosis not present

## 2023-07-01 DIAGNOSIS — I4891 Unspecified atrial fibrillation: Secondary | ICD-10-CM | POA: Diagnosis not present

## 2023-07-01 DIAGNOSIS — C50919 Malignant neoplasm of unspecified site of unspecified female breast: Secondary | ICD-10-CM | POA: Diagnosis not present

## 2023-07-01 DIAGNOSIS — C787 Secondary malignant neoplasm of liver and intrahepatic bile duct: Secondary | ICD-10-CM | POA: Diagnosis not present

## 2023-07-01 DIAGNOSIS — C7800 Secondary malignant neoplasm of unspecified lung: Secondary | ICD-10-CM | POA: Diagnosis not present

## 2023-07-01 DIAGNOSIS — G40909 Epilepsy, unspecified, not intractable, without status epilepticus: Secondary | ICD-10-CM | POA: Diagnosis not present

## 2023-07-02 DIAGNOSIS — G40909 Epilepsy, unspecified, not intractable, without status epilepticus: Secondary | ICD-10-CM | POA: Diagnosis not present

## 2023-07-02 DIAGNOSIS — C787 Secondary malignant neoplasm of liver and intrahepatic bile duct: Secondary | ICD-10-CM | POA: Diagnosis not present

## 2023-07-02 DIAGNOSIS — I4891 Unspecified atrial fibrillation: Secondary | ICD-10-CM | POA: Diagnosis not present

## 2023-07-02 DIAGNOSIS — C7800 Secondary malignant neoplasm of unspecified lung: Secondary | ICD-10-CM | POA: Diagnosis not present

## 2023-07-02 DIAGNOSIS — I10 Essential (primary) hypertension: Secondary | ICD-10-CM | POA: Diagnosis not present

## 2023-07-02 DIAGNOSIS — C50919 Malignant neoplasm of unspecified site of unspecified female breast: Secondary | ICD-10-CM | POA: Diagnosis not present

## 2023-07-03 DIAGNOSIS — C7800 Secondary malignant neoplasm of unspecified lung: Secondary | ICD-10-CM | POA: Diagnosis not present

## 2023-07-03 DIAGNOSIS — I10 Essential (primary) hypertension: Secondary | ICD-10-CM | POA: Diagnosis not present

## 2023-07-03 DIAGNOSIS — G40909 Epilepsy, unspecified, not intractable, without status epilepticus: Secondary | ICD-10-CM | POA: Diagnosis not present

## 2023-07-03 DIAGNOSIS — I4891 Unspecified atrial fibrillation: Secondary | ICD-10-CM | POA: Diagnosis not present

## 2023-07-03 DIAGNOSIS — C787 Secondary malignant neoplasm of liver and intrahepatic bile duct: Secondary | ICD-10-CM | POA: Diagnosis not present

## 2023-07-03 DIAGNOSIS — C50919 Malignant neoplasm of unspecified site of unspecified female breast: Secondary | ICD-10-CM | POA: Diagnosis not present

## 2023-07-22 DEATH — deceased

## 2023-10-24 ENCOUNTER — Telehealth: Payer: Self-pay | Admitting: Physician Assistant

## 2023-10-24 NOTE — Telephone Encounter (Signed)
Pt passed away 07/03/23

## 2023-10-31 ENCOUNTER — Ambulatory Visit: Payer: Self-pay | Admitting: Physician Assistant

## 2024-01-21 ENCOUNTER — Encounter: Payer: Medicare Other | Admitting: Family

## 2024-01-27 NOTE — Progress Notes (Signed)
   This encounter was created in error - please disregard. No show
